# Patient Record
Sex: Female | Born: 1970 | Race: White | Hispanic: No | Marital: Single | Smoking: Never smoker
Health system: Southern US, Community
[De-identification: ages and names within clinical notes are randomized; demographics above are authoritative.]

## PROBLEM LIST (undated history)

## (undated) DIAGNOSIS — K219 Gastro-esophageal reflux disease without esophagitis: Secondary | ICD-10-CM

## (undated) DIAGNOSIS — Z5189 Encounter for other specified aftercare: Secondary | ICD-10-CM

## (undated) DIAGNOSIS — E662 Morbid (severe) obesity with alveolar hypoventilation: Secondary | ICD-10-CM

## (undated) DIAGNOSIS — Z8614 Personal history of Methicillin resistant Staphylococcus aureus infection: Secondary | ICD-10-CM

## (undated) DIAGNOSIS — IMO0001 Reserved for inherently not codable concepts without codable children: Secondary | ICD-10-CM

## (undated) DIAGNOSIS — F329 Major depressive disorder, single episode, unspecified: Secondary | ICD-10-CM

## (undated) DIAGNOSIS — F32A Depression, unspecified: Secondary | ICD-10-CM

## (undated) DIAGNOSIS — M797 Fibromyalgia: Secondary | ICD-10-CM

## (undated) DIAGNOSIS — M21371 Foot drop, right foot: Secondary | ICD-10-CM

## (undated) DIAGNOSIS — J069 Acute upper respiratory infection, unspecified: Secondary | ICD-10-CM

## (undated) DIAGNOSIS — G4730 Sleep apnea, unspecified: Secondary | ICD-10-CM

## (undated) DIAGNOSIS — Z8669 Personal history of other diseases of the nervous system and sense organs: Secondary | ICD-10-CM

## (undated) DIAGNOSIS — G894 Chronic pain syndrome: Secondary | ICD-10-CM

## (undated) DIAGNOSIS — R0602 Shortness of breath: Secondary | ICD-10-CM

## (undated) DIAGNOSIS — R011 Cardiac murmur, unspecified: Secondary | ICD-10-CM

## (undated) DIAGNOSIS — M1990 Unspecified osteoarthritis, unspecified site: Secondary | ICD-10-CM

## (undated) DIAGNOSIS — E039 Hypothyroidism, unspecified: Secondary | ICD-10-CM

## (undated) DIAGNOSIS — M549 Dorsalgia, unspecified: Secondary | ICD-10-CM

## (undated) DIAGNOSIS — I499 Cardiac arrhythmia, unspecified: Secondary | ICD-10-CM

## (undated) DIAGNOSIS — Z87442 Personal history of urinary calculi: Secondary | ICD-10-CM

## (undated) DIAGNOSIS — D649 Anemia, unspecified: Secondary | ICD-10-CM

## (undated) DIAGNOSIS — I10 Essential (primary) hypertension: Secondary | ICD-10-CM

## (undated) DIAGNOSIS — K429 Umbilical hernia without obstruction or gangrene: Secondary | ICD-10-CM

## (undated) DIAGNOSIS — N179 Acute kidney failure, unspecified: Secondary | ICD-10-CM

## (undated) DIAGNOSIS — I739 Peripheral vascular disease, unspecified: Secondary | ICD-10-CM

## (undated) DIAGNOSIS — T148XXA Other injury of unspecified body region, initial encounter: Secondary | ICD-10-CM

## (undated) DIAGNOSIS — R112 Nausea with vomiting, unspecified: Secondary | ICD-10-CM

## (undated) DIAGNOSIS — Z9889 Other specified postprocedural states: Secondary | ICD-10-CM

## (undated) DIAGNOSIS — L97409 Non-pressure chronic ulcer of unspecified heel and midfoot with unspecified severity: Secondary | ICD-10-CM

## (undated) DIAGNOSIS — F419 Anxiety disorder, unspecified: Secondary | ICD-10-CM

## (undated) DIAGNOSIS — N189 Chronic kidney disease, unspecified: Secondary | ICD-10-CM

## (undated) HISTORY — PX: BRAIN SURGERY: SHX531

## (undated) HISTORY — PX: EYE SURGERY: SHX253

## (undated) HISTORY — PX: BACK SURGERY: SHX140

## (undated) HISTORY — PX: LITHOTRIPSY: SUR834

## (undated) HISTORY — PX: OTHER SURGICAL HISTORY: SHX169

---
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
## 2001-08-01 ENCOUNTER — Ambulatory Visit (HOSPITAL_COMMUNITY): Admission: RE | Admit: 2001-08-01 | Discharge: 2001-08-01 | Payer: Self-pay | Admitting: Unknown Physician Specialty

## 2001-08-01 ENCOUNTER — Emergency Department (HOSPITAL_COMMUNITY): Admission: EM | Admit: 2001-08-01 | Discharge: 2001-08-01 | Payer: Self-pay

## 2003-10-17 ENCOUNTER — Inpatient Hospital Stay (HOSPITAL_COMMUNITY): Admission: AD | Admit: 2003-10-17 | Discharge: 2003-10-25 | Payer: Self-pay | Admitting: Internal Medicine

## 2003-10-18 IMAGING — CR DG RIBS W/ CHEST 3+V*R*
3 series · 3 of 3 positions shown · non-contrast
Comparison: none

CLINICAL DATA: Fever / hemoptysis / right upper quadrant pain.  
 UNILATERAL RIGHT RIBS WITH CHEST
 PA chest and cone-down right rib views were obtained.  There are no rib fractures evident.  No pneumothorax.  Heart enlarged.  Lungs clear.  No heart failure.  
 Cholecystectomy clips incidentally noted.  
 IMPRESSION
 Cardiomegaly ? no active disease.  
 No rib fractures.

[view not recorded (1 of 3)]
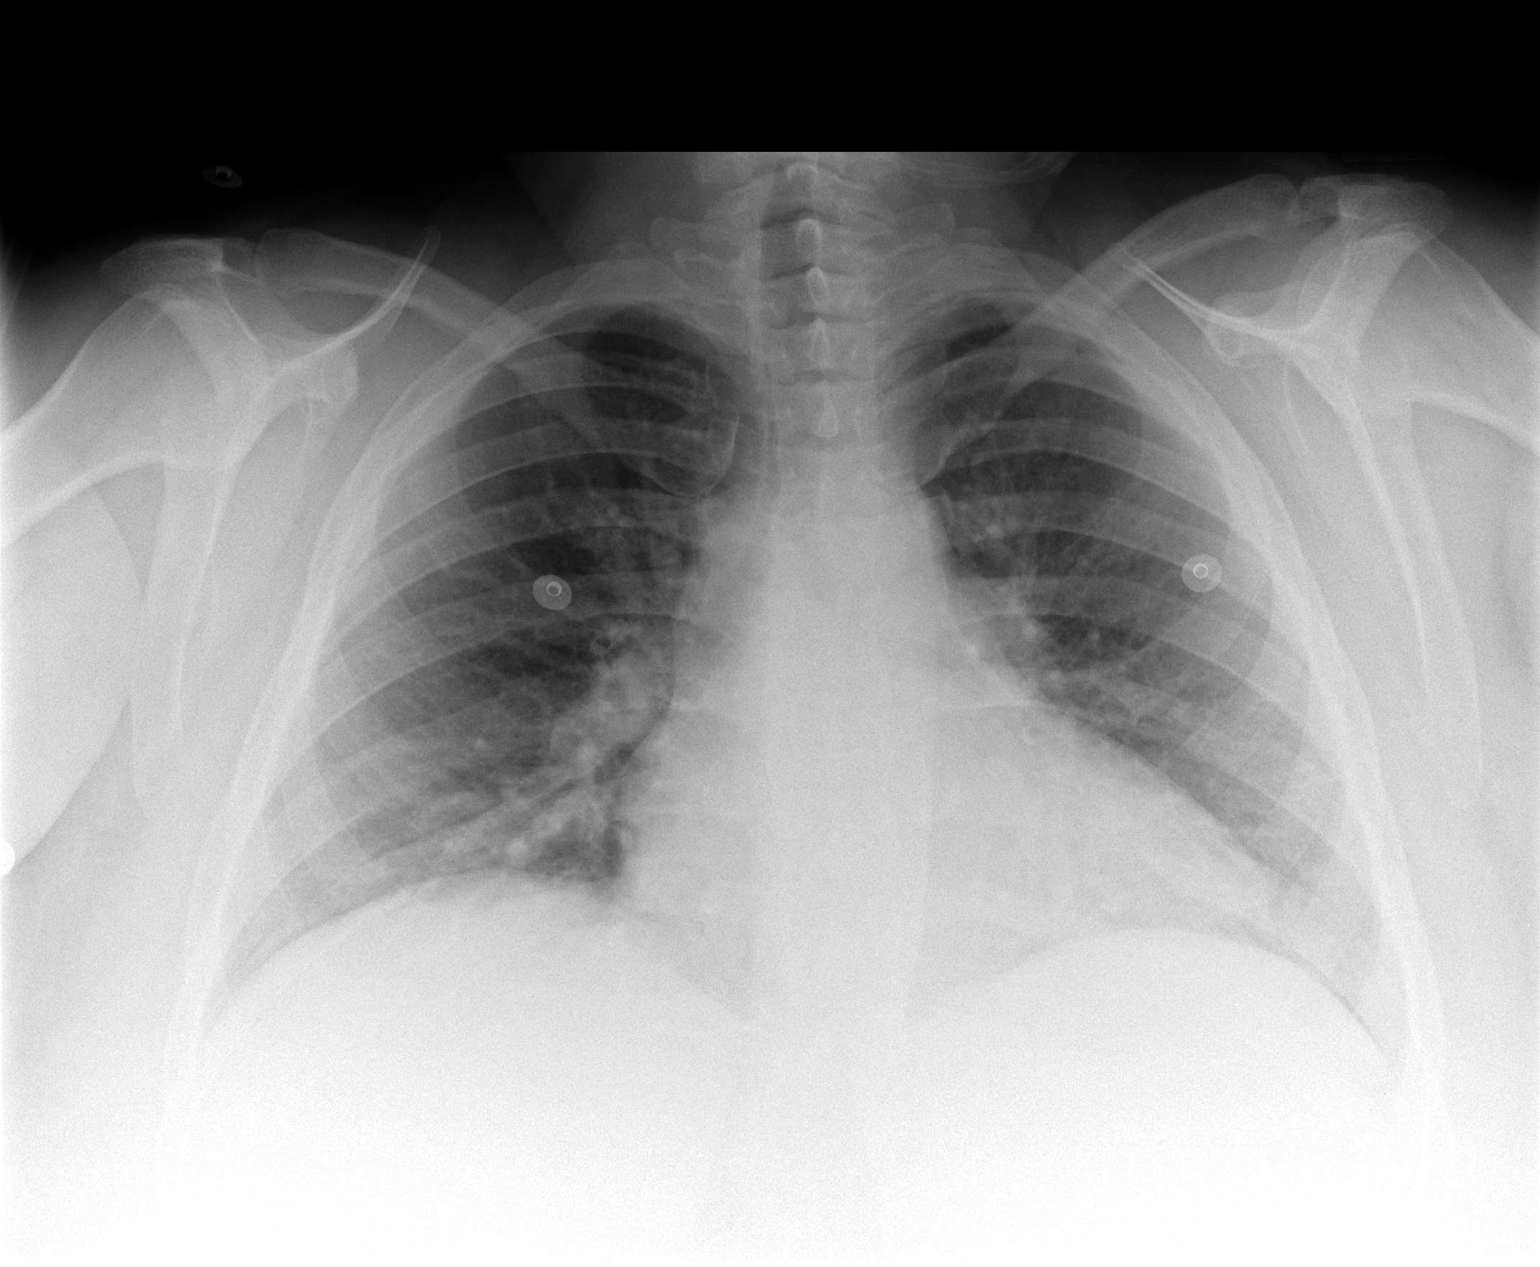

[view not recorded (2 of 3)]
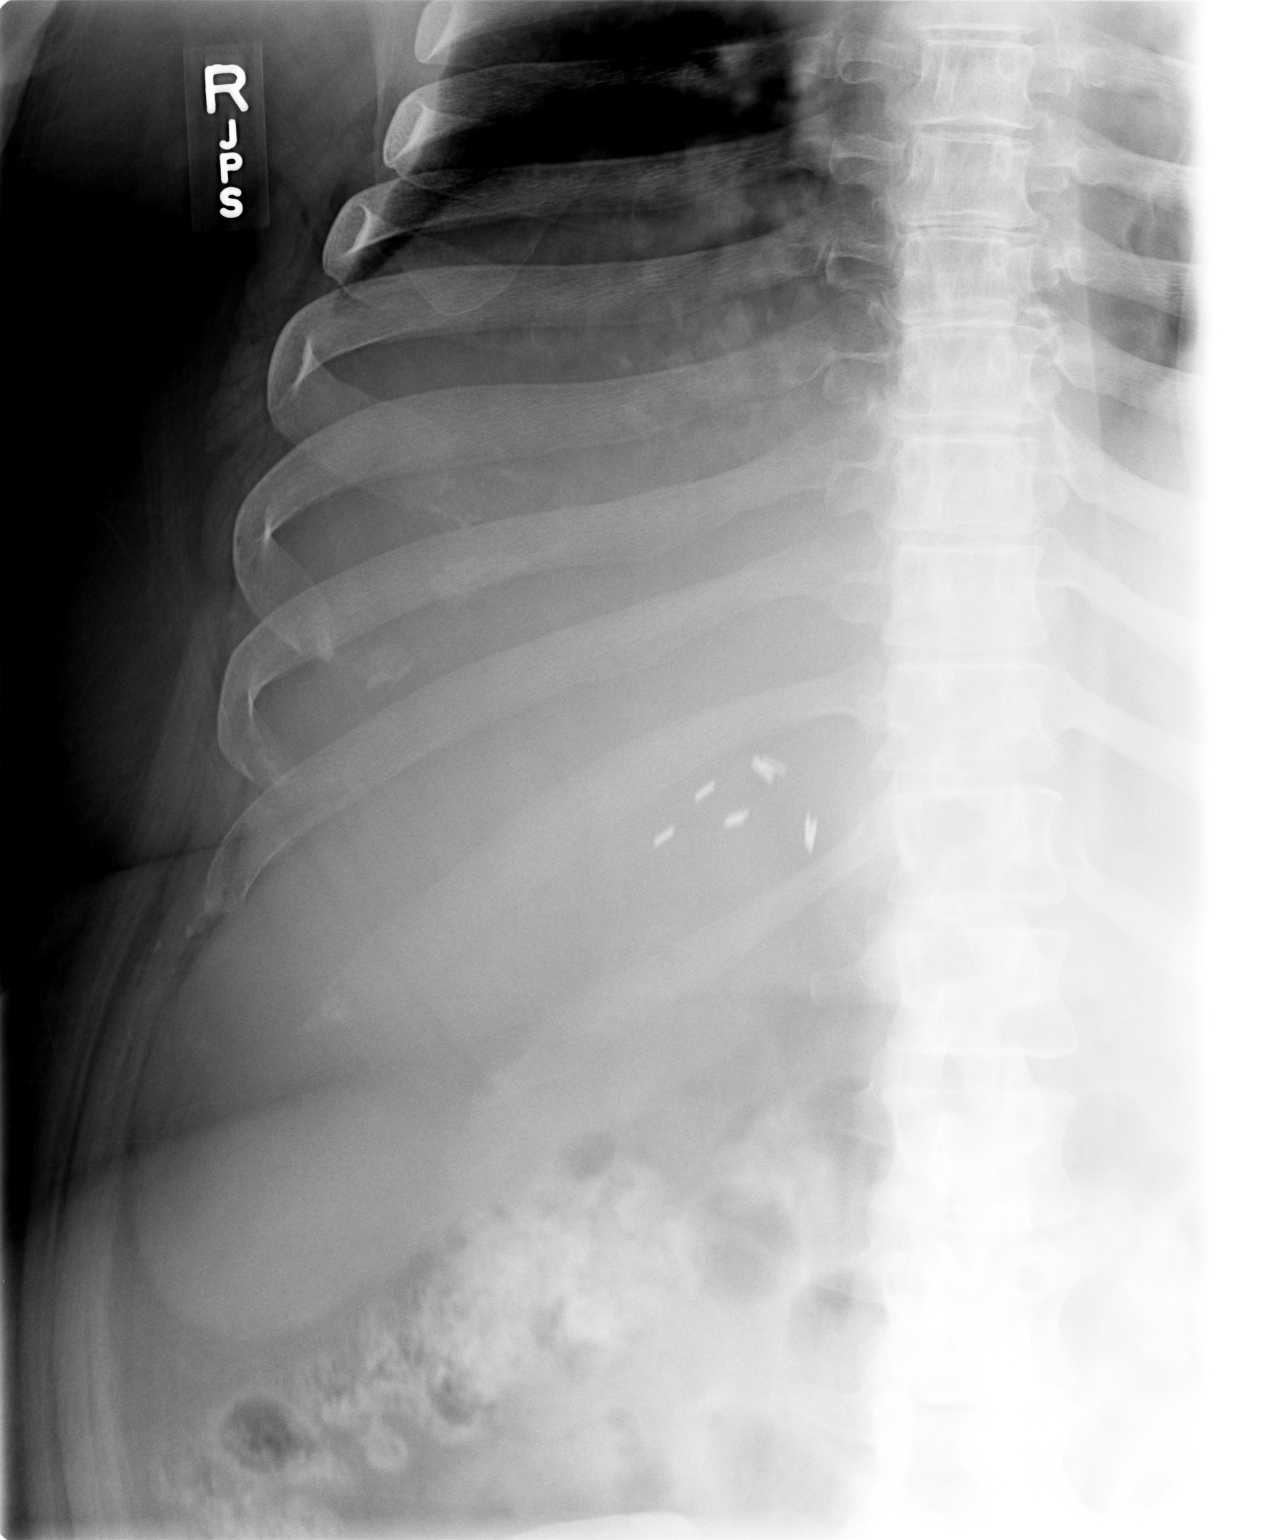

[view not recorded (3 of 3)]
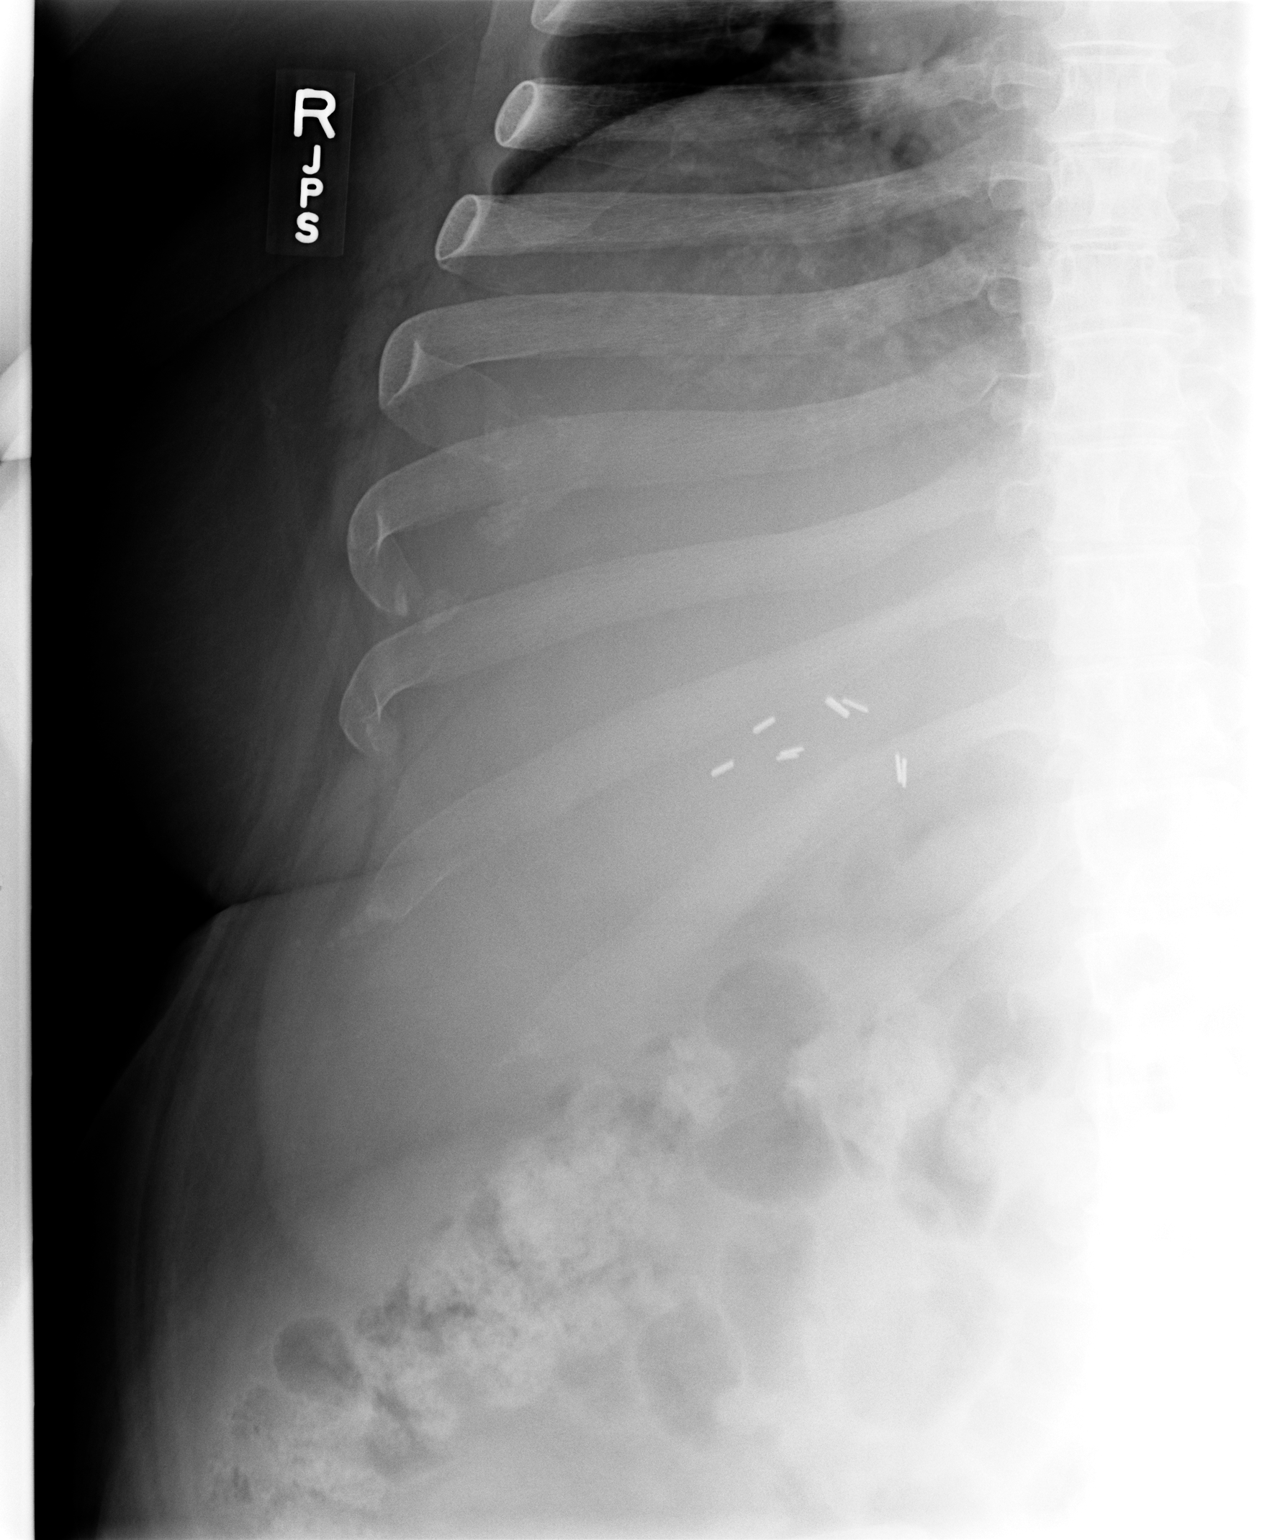

[3 of 3 positions shown; findings below may reference images not displayed]

## 2003-10-18 IMAGING — CT CT PELVIS W/ CM
2 of 7 series · 12 of 32 positions shown, 18 images · IV contrast (180 ML OMNI 300)
Comparison: none

CLINICAL DATA: Fever.  Hemoptysis.  Diabetes.  Elevated D-dimer.  Assess for pulmonary emboli. 
 CT SCAN OF THE CHEST WITH CONTRAST 
 Spiral scanning is performed during the intravenous administration of 180 cc of Omnipaque 300.  Contrast opacity is moderate at best and exclusion of pulmonary emboli is not done with the same confidence as would be the case with optimal contrast opacification.  
 I do not see any evidence of embolic disease to the pulmonary arterial tree.  The patient has linear atelectasis or scar in the left lower lobe.  Otherwise, the lungs are clear except for a rounded 9 mm density in the left upper lobe, likely to represent granuloma.  No hilar or mediastinal adenopathy.  No pleural fluid.  There is a tiny amount of pericardial fluid. 
 IMPRESSION
 1.  No sign of pulmonary emboli.  Contrast opacity is not optimal however.  See above discussion. 
 2.  Linear atelectasis or scar in the left lower lobe. 
 3.  Rounded density in the left upper lobe, likely to represent a benign granuloma. 
 CT SCAN OF THE ABDOMEN WITH CONTRAST 
 Spiral scanning was performed after oral and intravenous contrast administration. 
 The liver and spleen are unremarkable.  The patient has had cholecystectomy.  The pancreas appears normal.  The adrenal glands are normal.  No abnormality of the kidneys is seen.  The aorta and IVC are normal.  No free fluid or air. 
 Negative CT scan of the abdomen. 
 CT SCAN OF THE PELVIS 
 Spiral scanning is performed after oral and intravenous contrast administration. 
 There is no free fluid.  The bladder, uterus, and adnexal regions appear unremarkable.  No bowel pathology is seen. 
 1.  Negative CT scan of the pelvis.

[Series 3: pe w/ lower ext · axial · 0.98mm/px · z∈[-632,-153]mm · 10 of 204 slices shown, 16 images]
[im 19/204  soft-tissue]
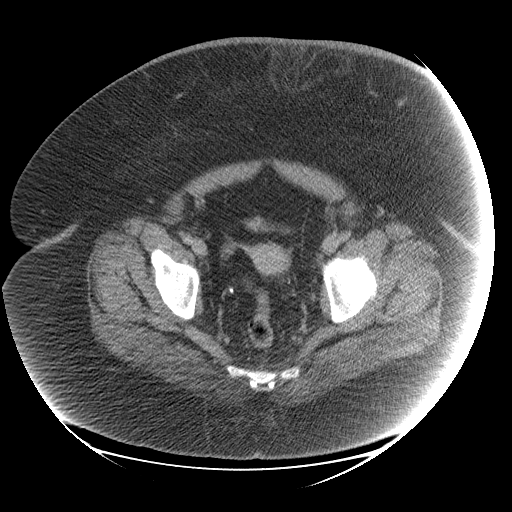
[im 19/204  bone]
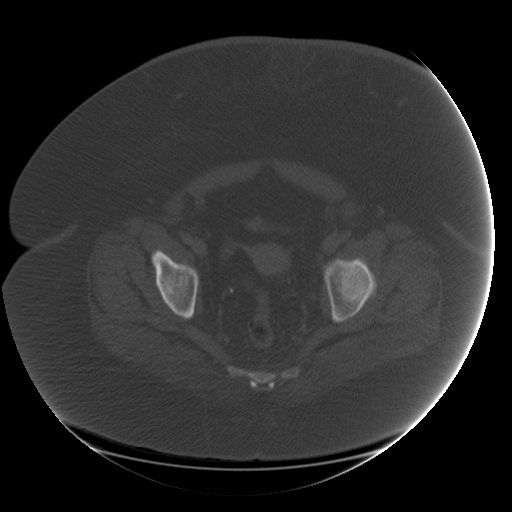
[im 37/204  soft-tissue]
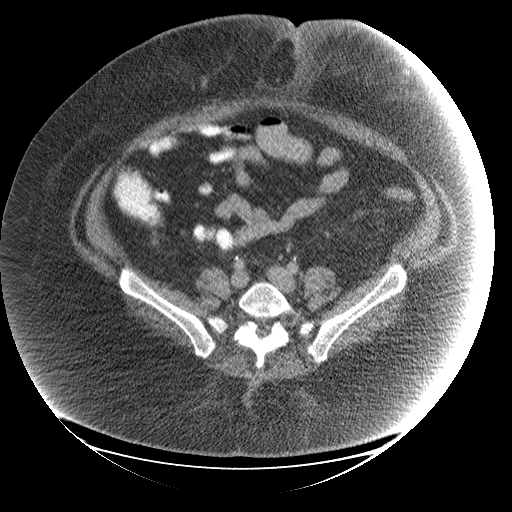
[im 56/204  soft-tissue]
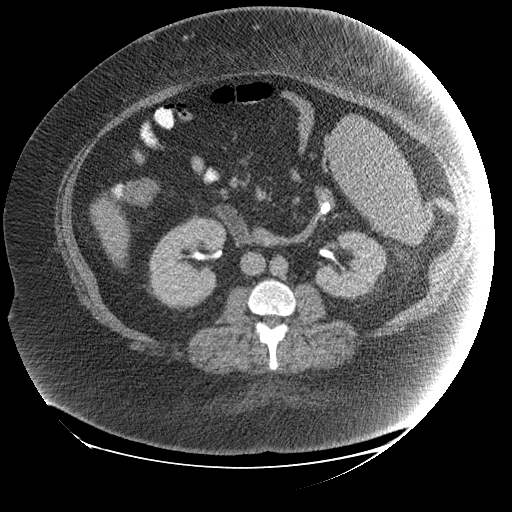
[im 74/204  soft-tissue]
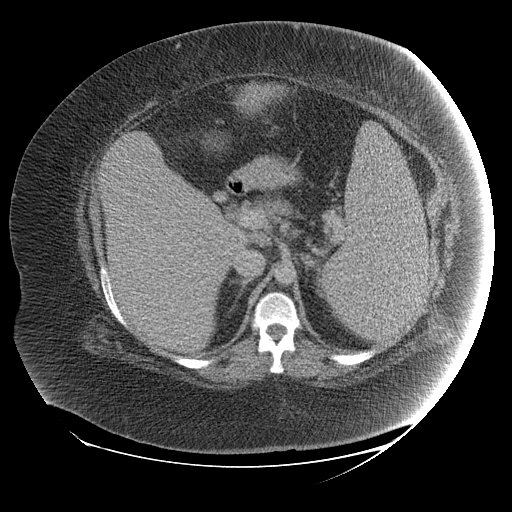
[im 93/204  soft-tissue]
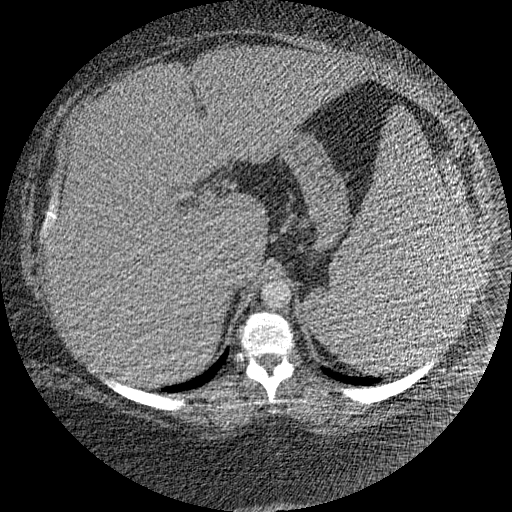
[im 111/204  soft-tissue]
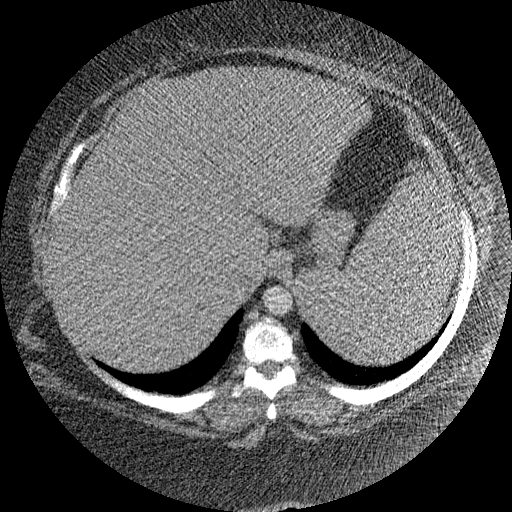
[im 130/204  soft-tissue]
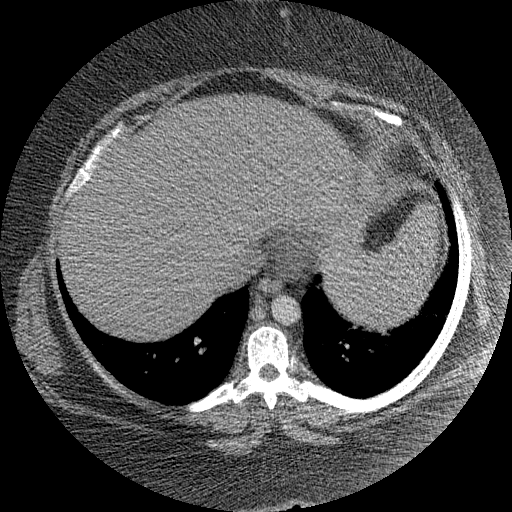
[im 130/204  lung]
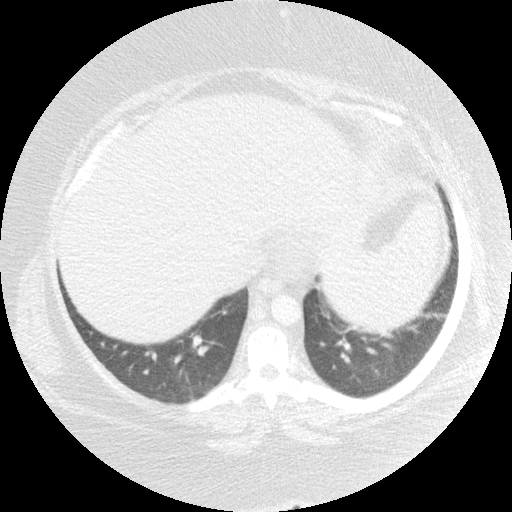
[im 148/204  soft-tissue]
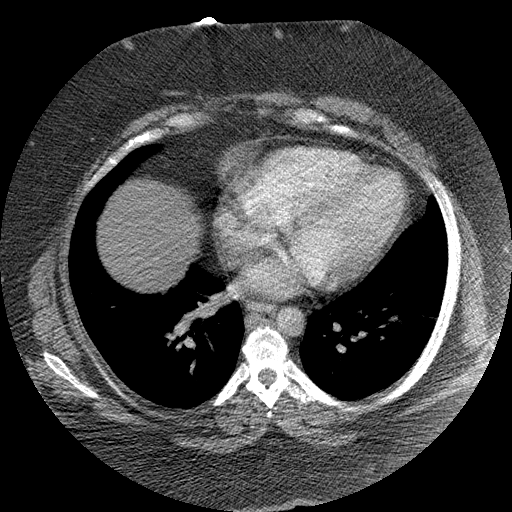
[im 148/204  lung]
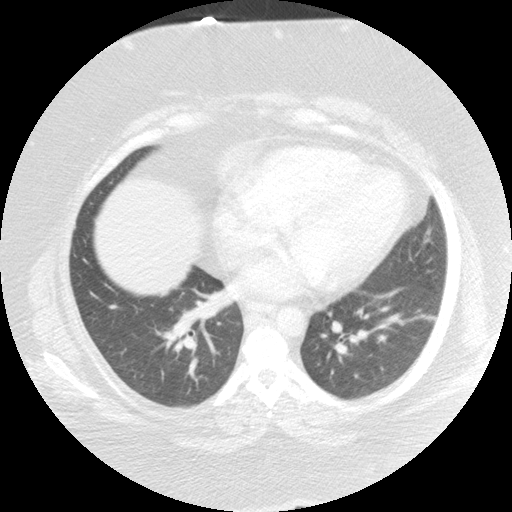
[im 167/204  soft-tissue]
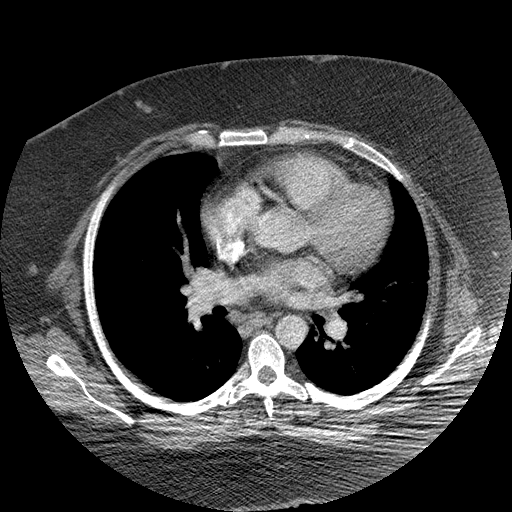
[im 167/204  lung]
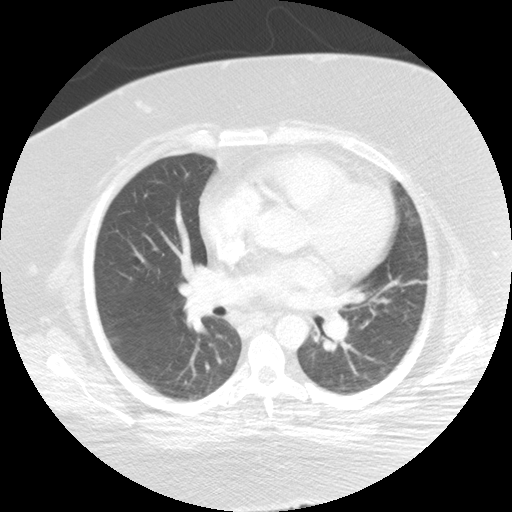
[im 167/204  bone]
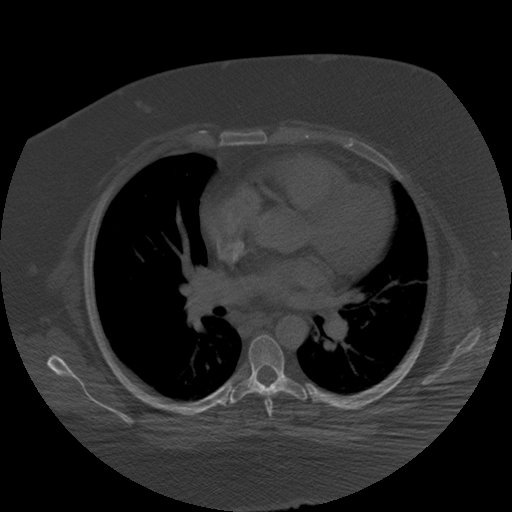
[im 185/204  soft-tissue]
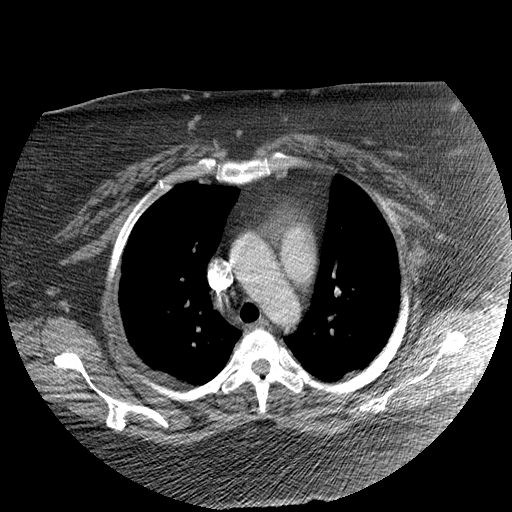
[im 185/204  lung]
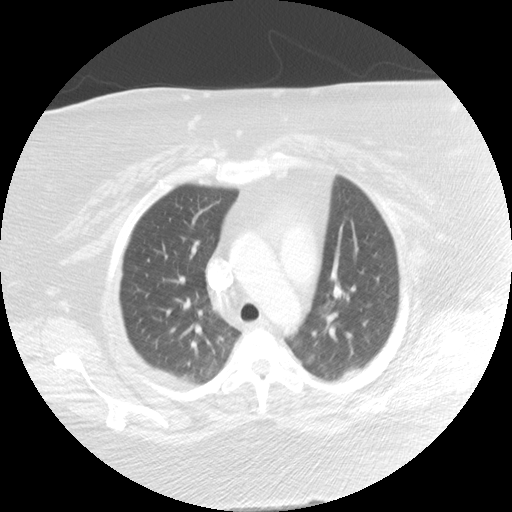

[Series 104: reformatted · sagittal · 0.47mm/px · 2 of 76 slices shown]
[im 26/76  soft-tissue]
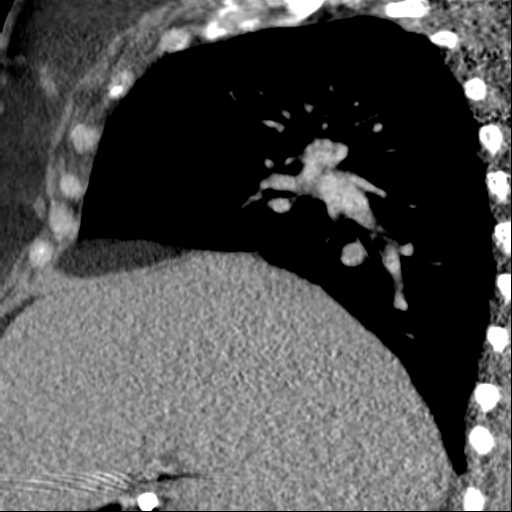
[im 51/76  soft-tissue]
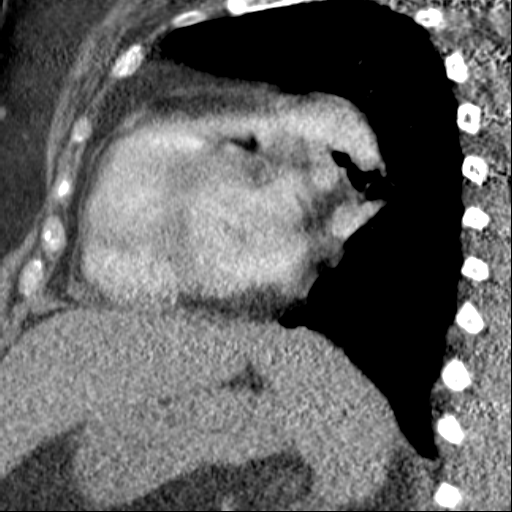

[12 of 32 positions shown; findings below may reference images not displayed]

## 2003-10-18 IMAGING — CT CT PARANASAL SINUSES LIMITED
1 series · 16 of 20 positions shown, 20 images · non-contrast
Comparison: none

CLINICAL DATA: Fever / hemoptysis.  
LIMITED MAXILLOFACIAL CT
This limited study of the sinuses was done with the patient supine.  She was not able to lie in the prone position for routine imaging of the sinuses.    
The right frontal sinus is hypoplastic and essentially non-aerated.  This is a variant of normal.  Other paranasal sinuses are well aerated with no acute or chronic changes of sinusitis.  No bony destructive lesions.  No air in the orbits.  
IMPRESSION
No acute or significant findings ? the right frontal sinus is hypoplastic.

[Series 2: — · axial · 0.35mm/px · z∈[+97,+184]mm · 16 of 20 slices shown, 20 images]
[im 2/20  brain]
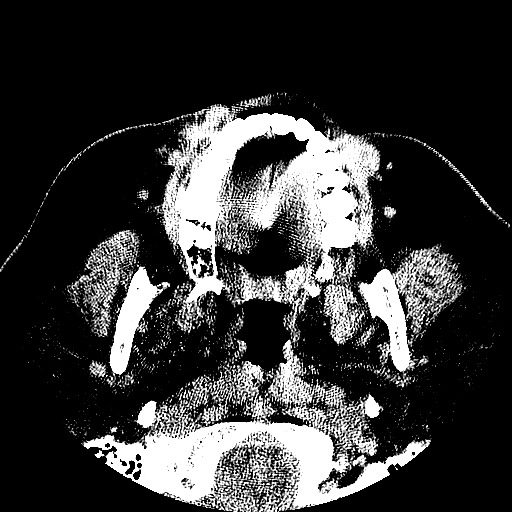
[im 2/20  bone]
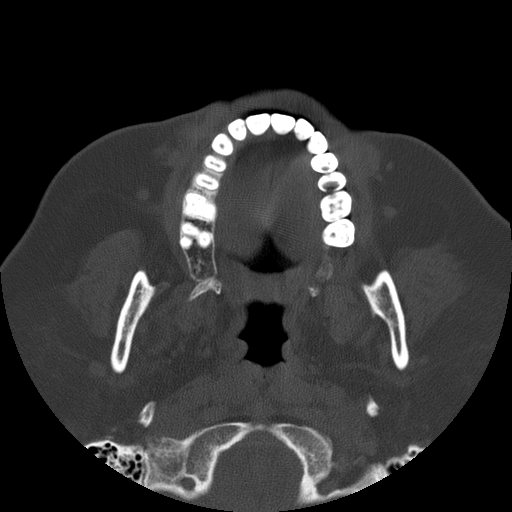
[im 3/20  bone]
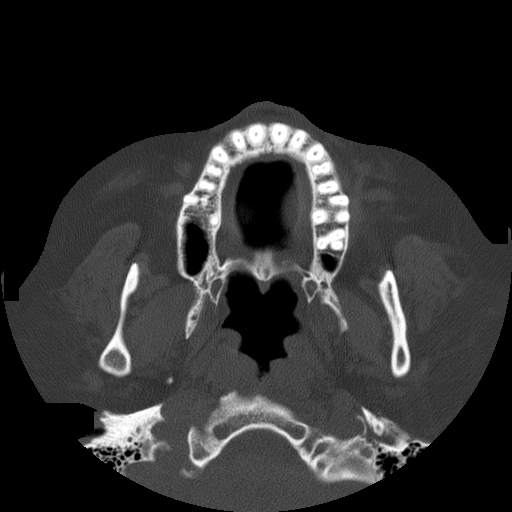
[im 4/20  bone]
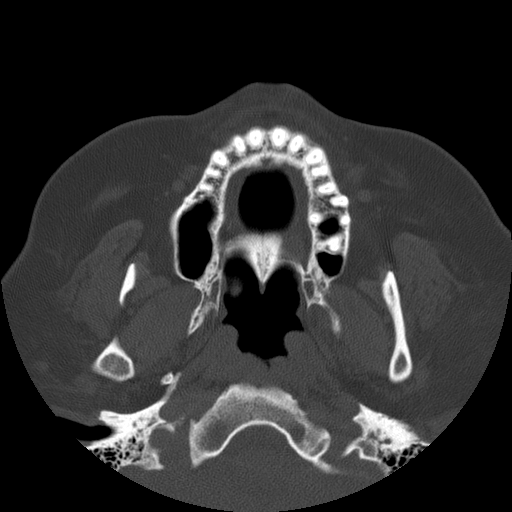
[im 5/20  bone]
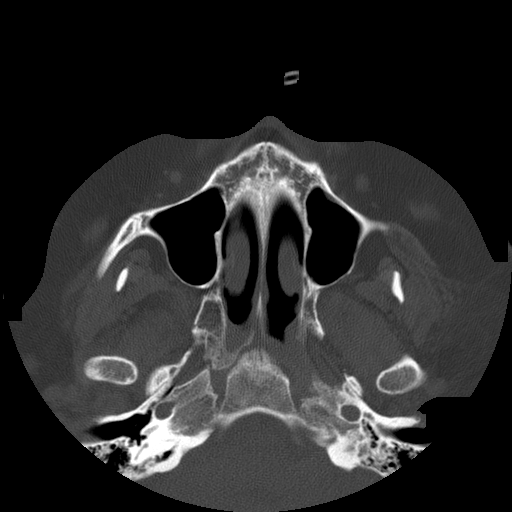
[im 7/20  brain]
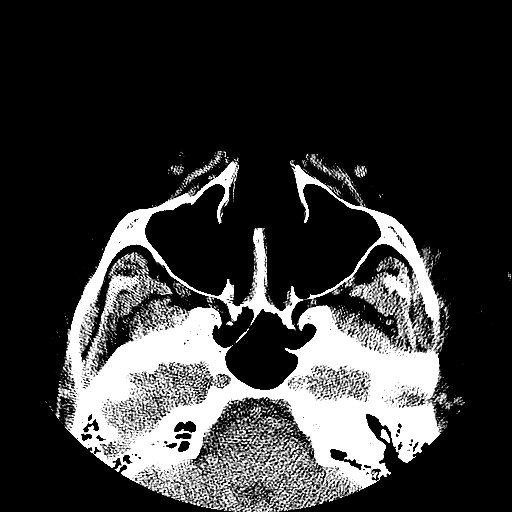
[im 7/20  bone]
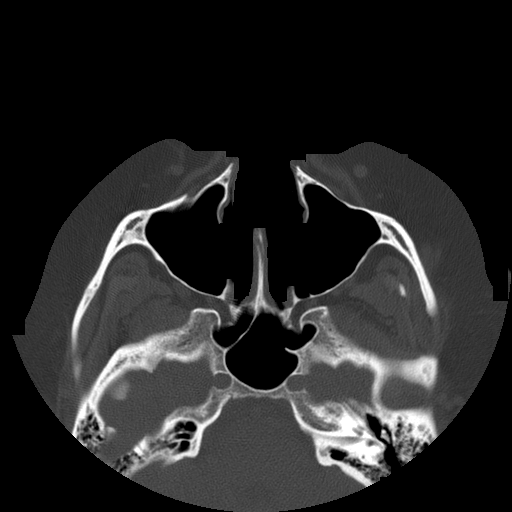
[im 8/20  bone]
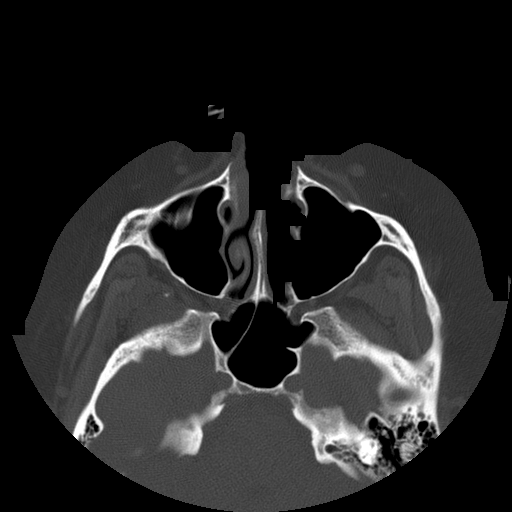
[im 9/20  bone]
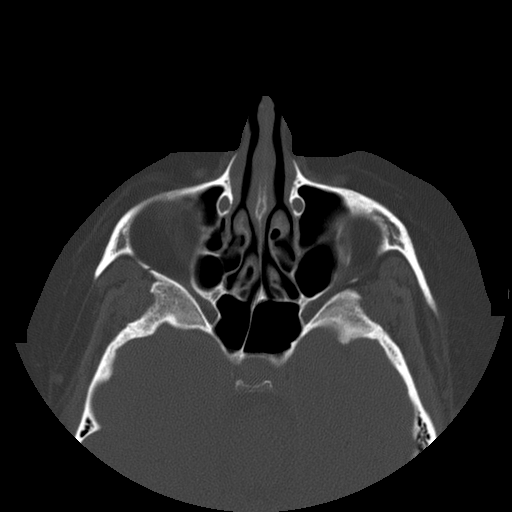
[im 10/20  bone]
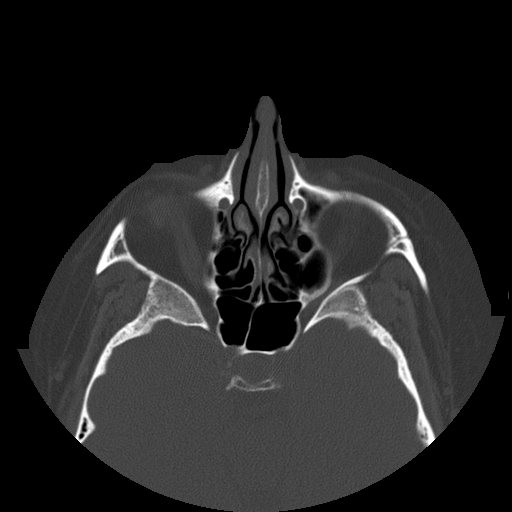
[im 11/20  brain]
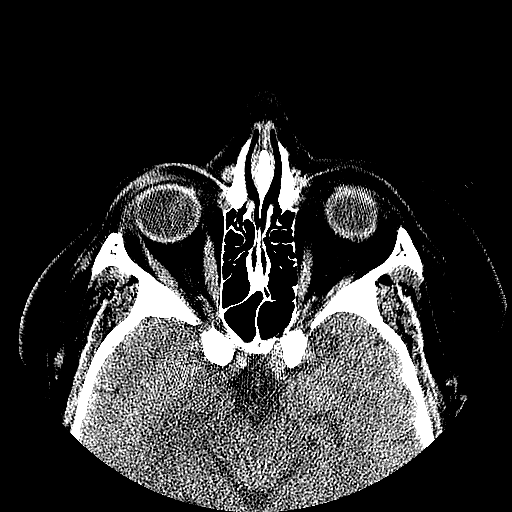
[im 11/20  bone]
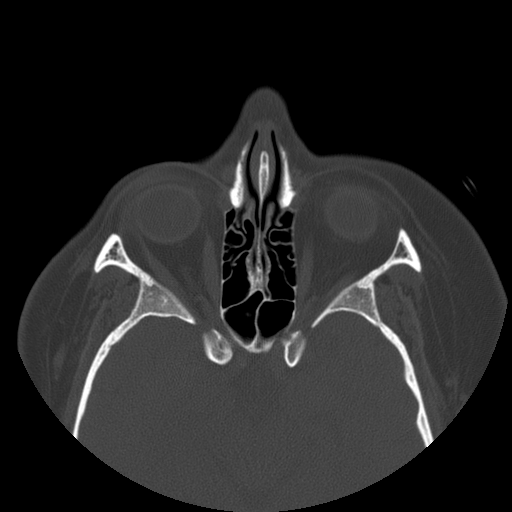
[im 12/20  bone]
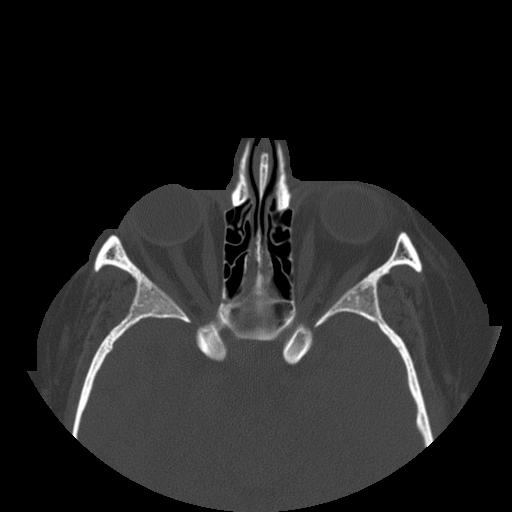
[im 13/20  bone]
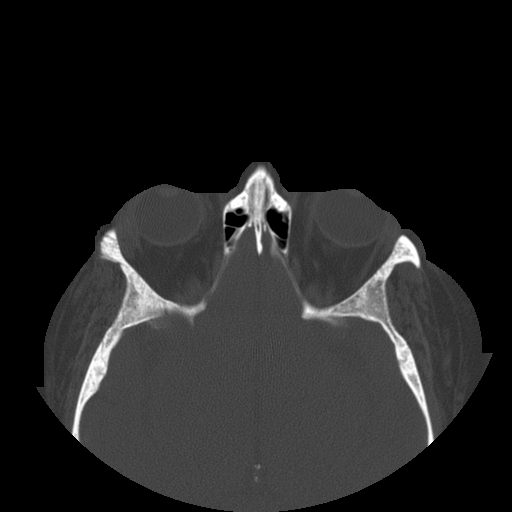
[im 14/20  bone]
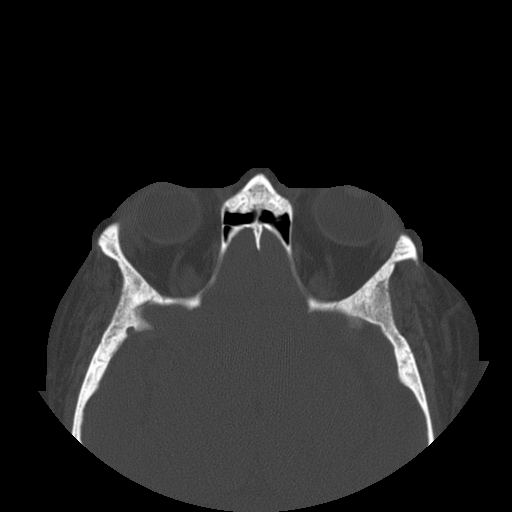
[im 16/20  brain]
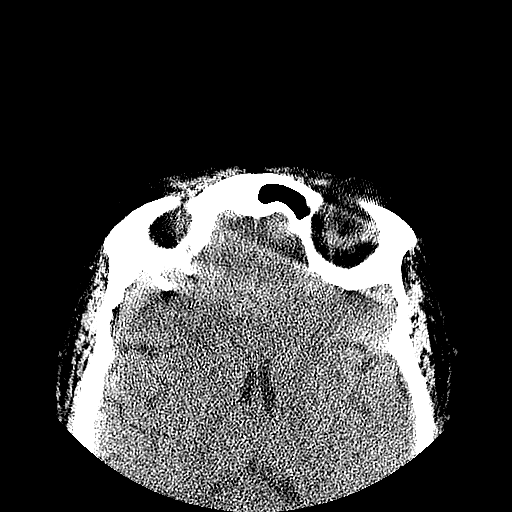
[im 16/20  bone]
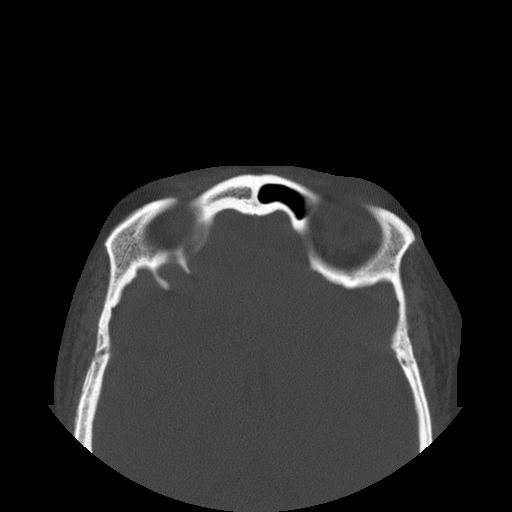
[im 17/20  bone]
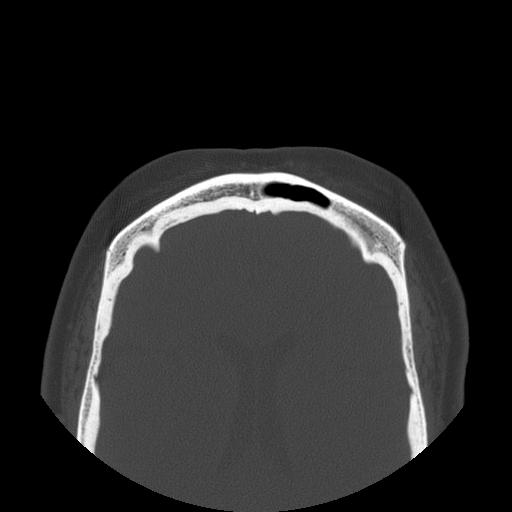
[im 18/20  bone]
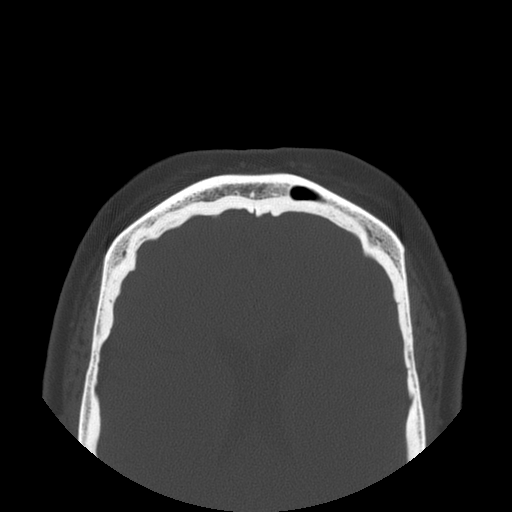
[im 19/20  bone]
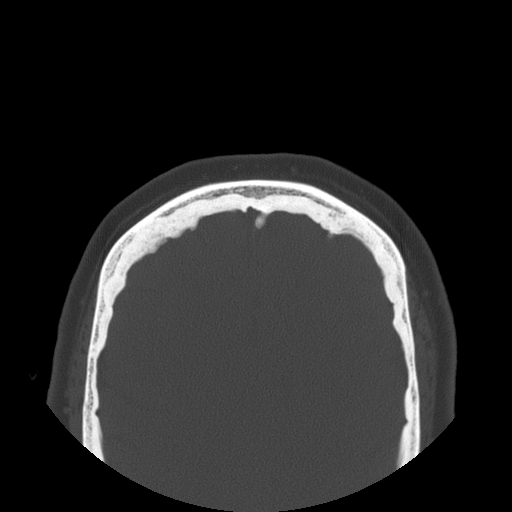

[16 of 20 positions shown; findings below may reference images not displayed]

## 2003-10-20 ENCOUNTER — Encounter (INDEPENDENT_AMBULATORY_CARE_PROVIDER_SITE_OTHER): Payer: Self-pay | Admitting: *Deleted

## 2003-10-24 ENCOUNTER — Encounter (INDEPENDENT_AMBULATORY_CARE_PROVIDER_SITE_OTHER): Payer: Self-pay | Admitting: *Deleted

## 2003-10-25 IMAGING — CR DG CHEST 1V
1 series · 1 of 1 positions shown · non-contrast
Comparison: [DATE].

CLINICAL DATA: Fever.  Hemoptysis.  Shortness of breath.  
 SINGLE-VIEW CHEST

[view not recorded]
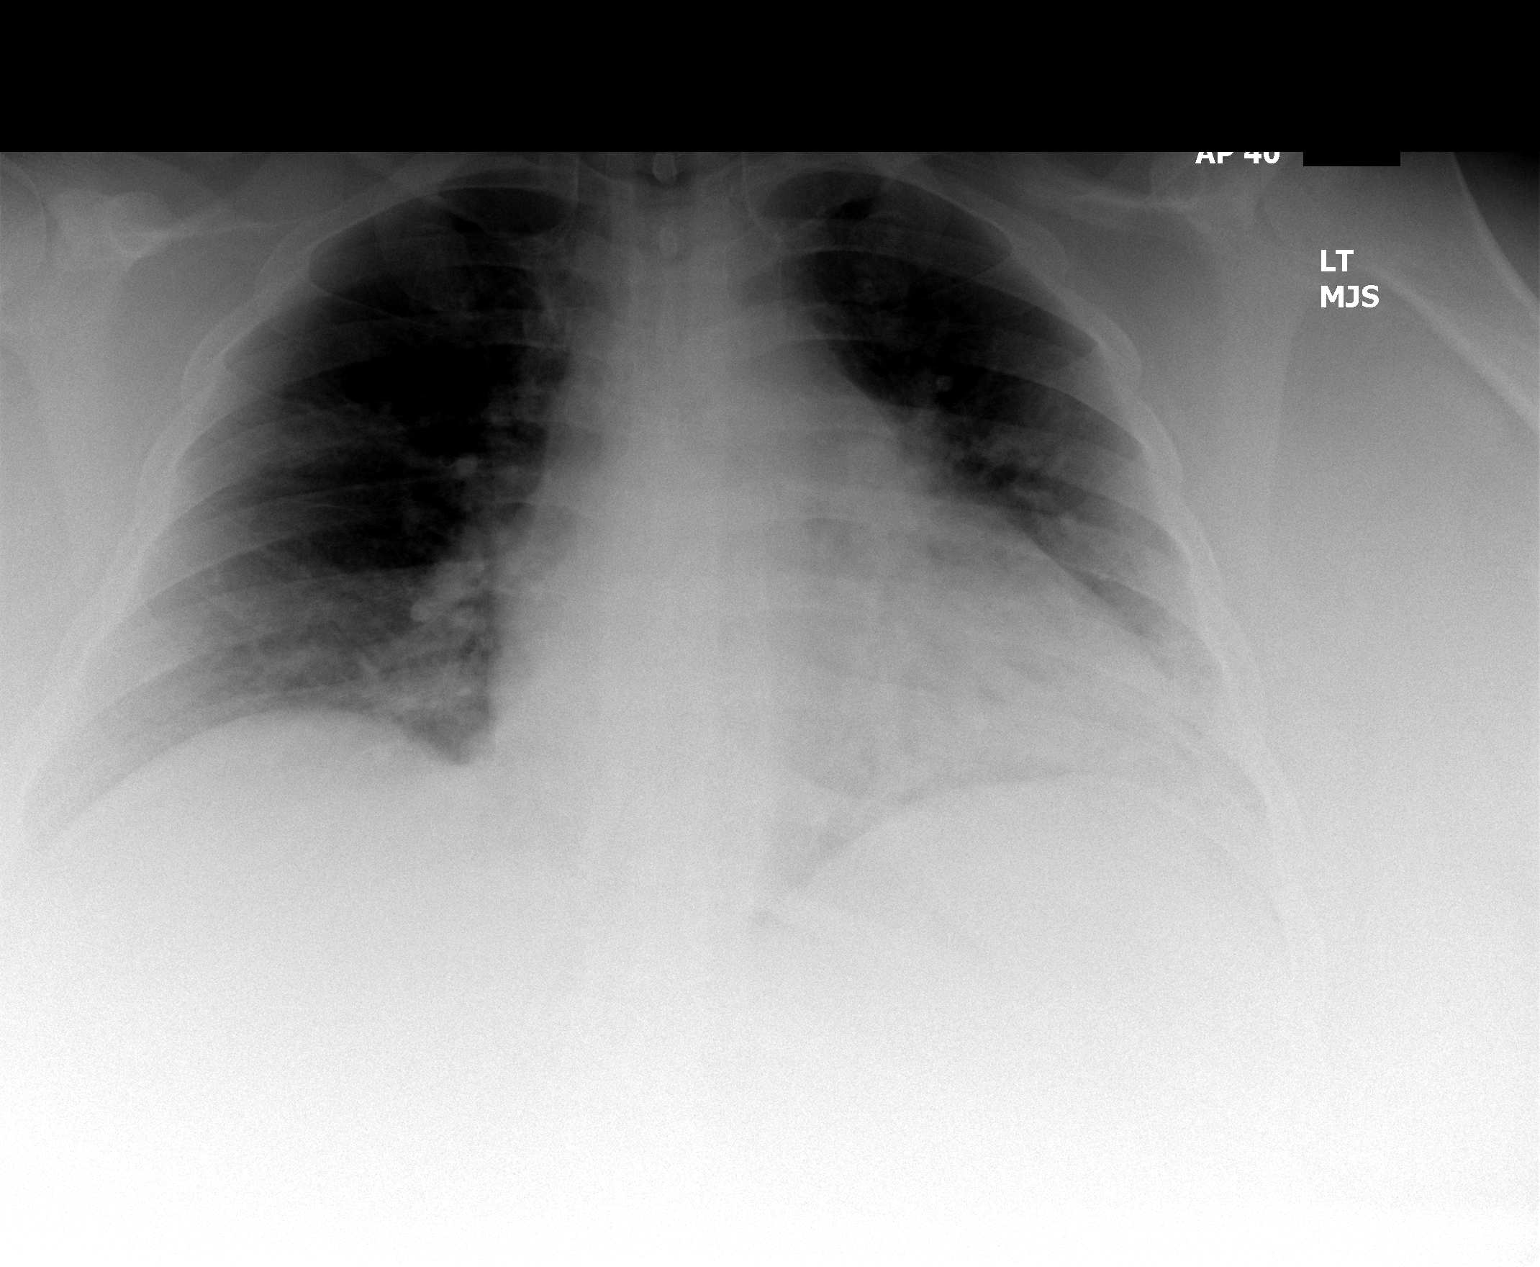

[1 of 1 positions shown; findings below may reference images not displayed]

Low lung volumes are again seen.  Heart size is prominent, although this may be due to low lung volumes.  Both lungs remain clear.  
 IMPRESSION
 Low inspiratory lung volumes.  No acute disease.

## 2003-12-01 ENCOUNTER — Inpatient Hospital Stay (HOSPITAL_COMMUNITY): Admission: EM | Admit: 2003-12-01 | Discharge: 2003-12-09 | Payer: Self-pay | Admitting: Oncology

## 2003-12-02 IMAGING — CR DG CHEST 2V
2 series · 2 of 2 positions shown · non-contrast
Comparison: [DATE].

CLINICAL DATA: Anemia and thrombocytopenia.  Low grade fever.  Hemoptysis.
 TWO VIEW CHEST   - [DATE]

[view not recorded (1 of 2)]
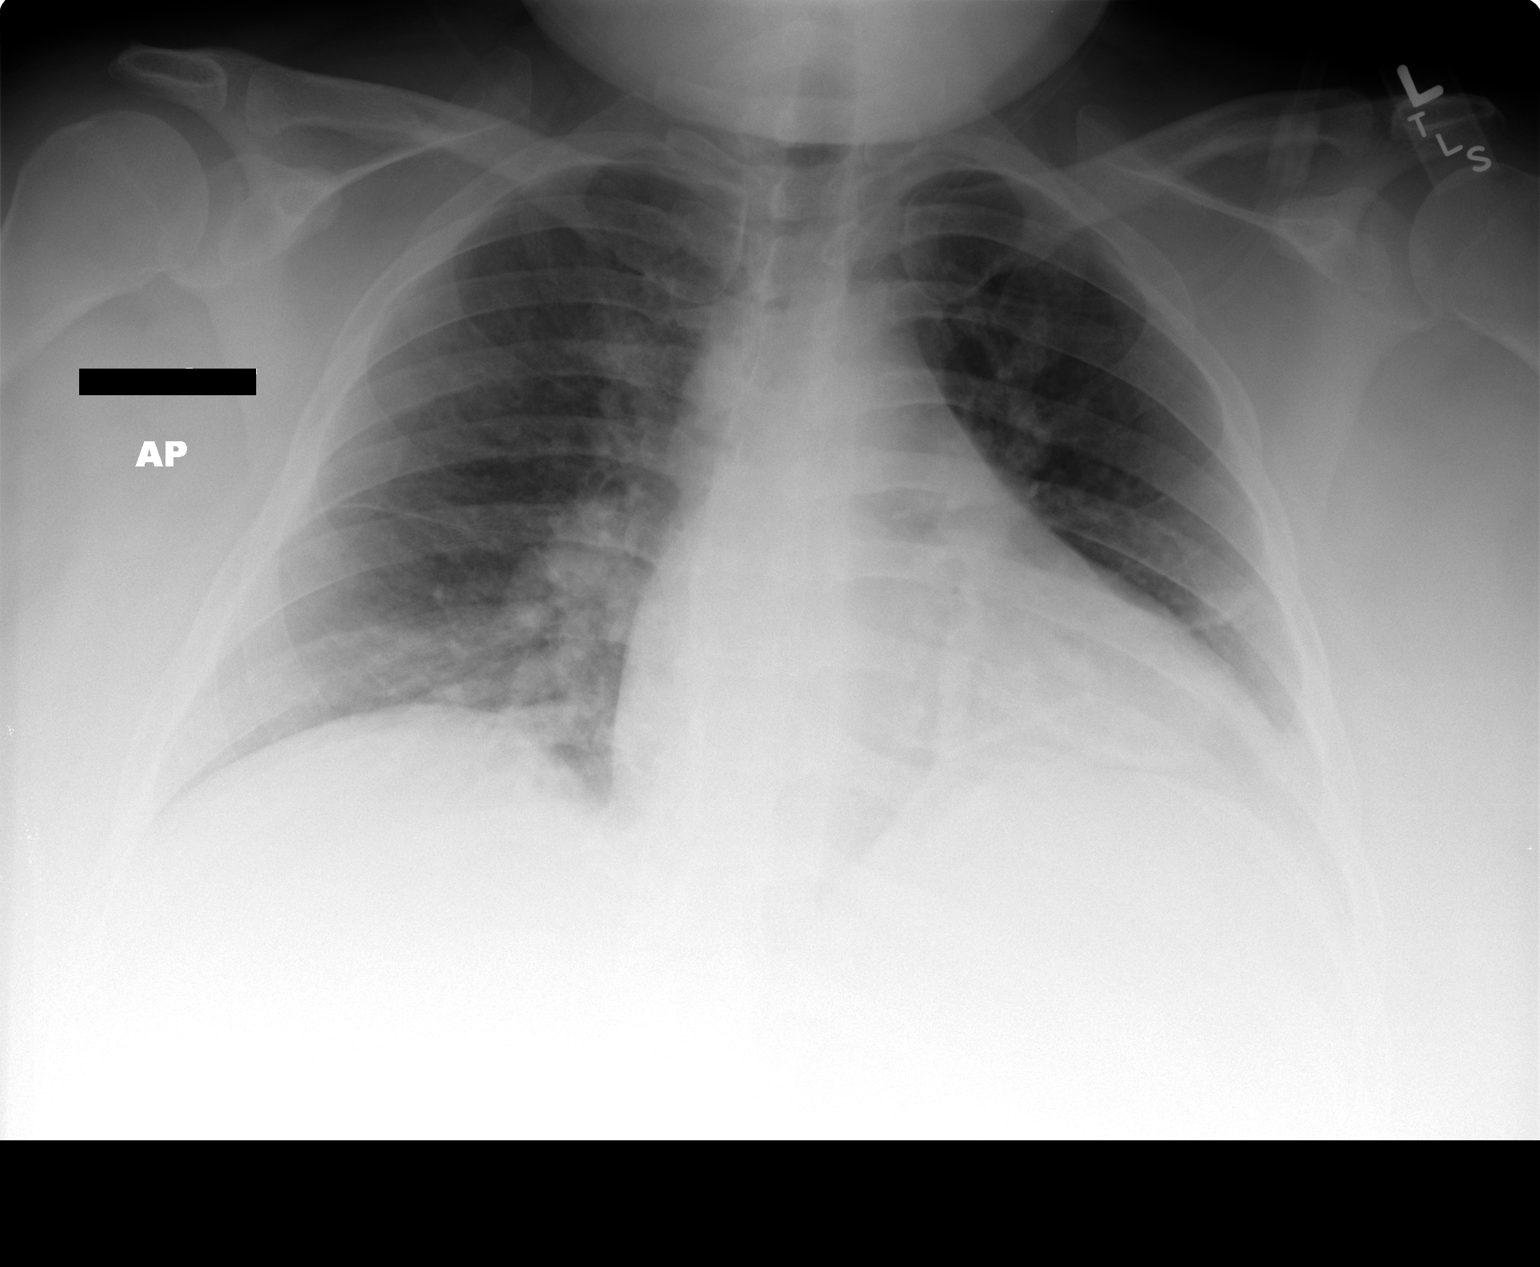

[view not recorded (2 of 2)]
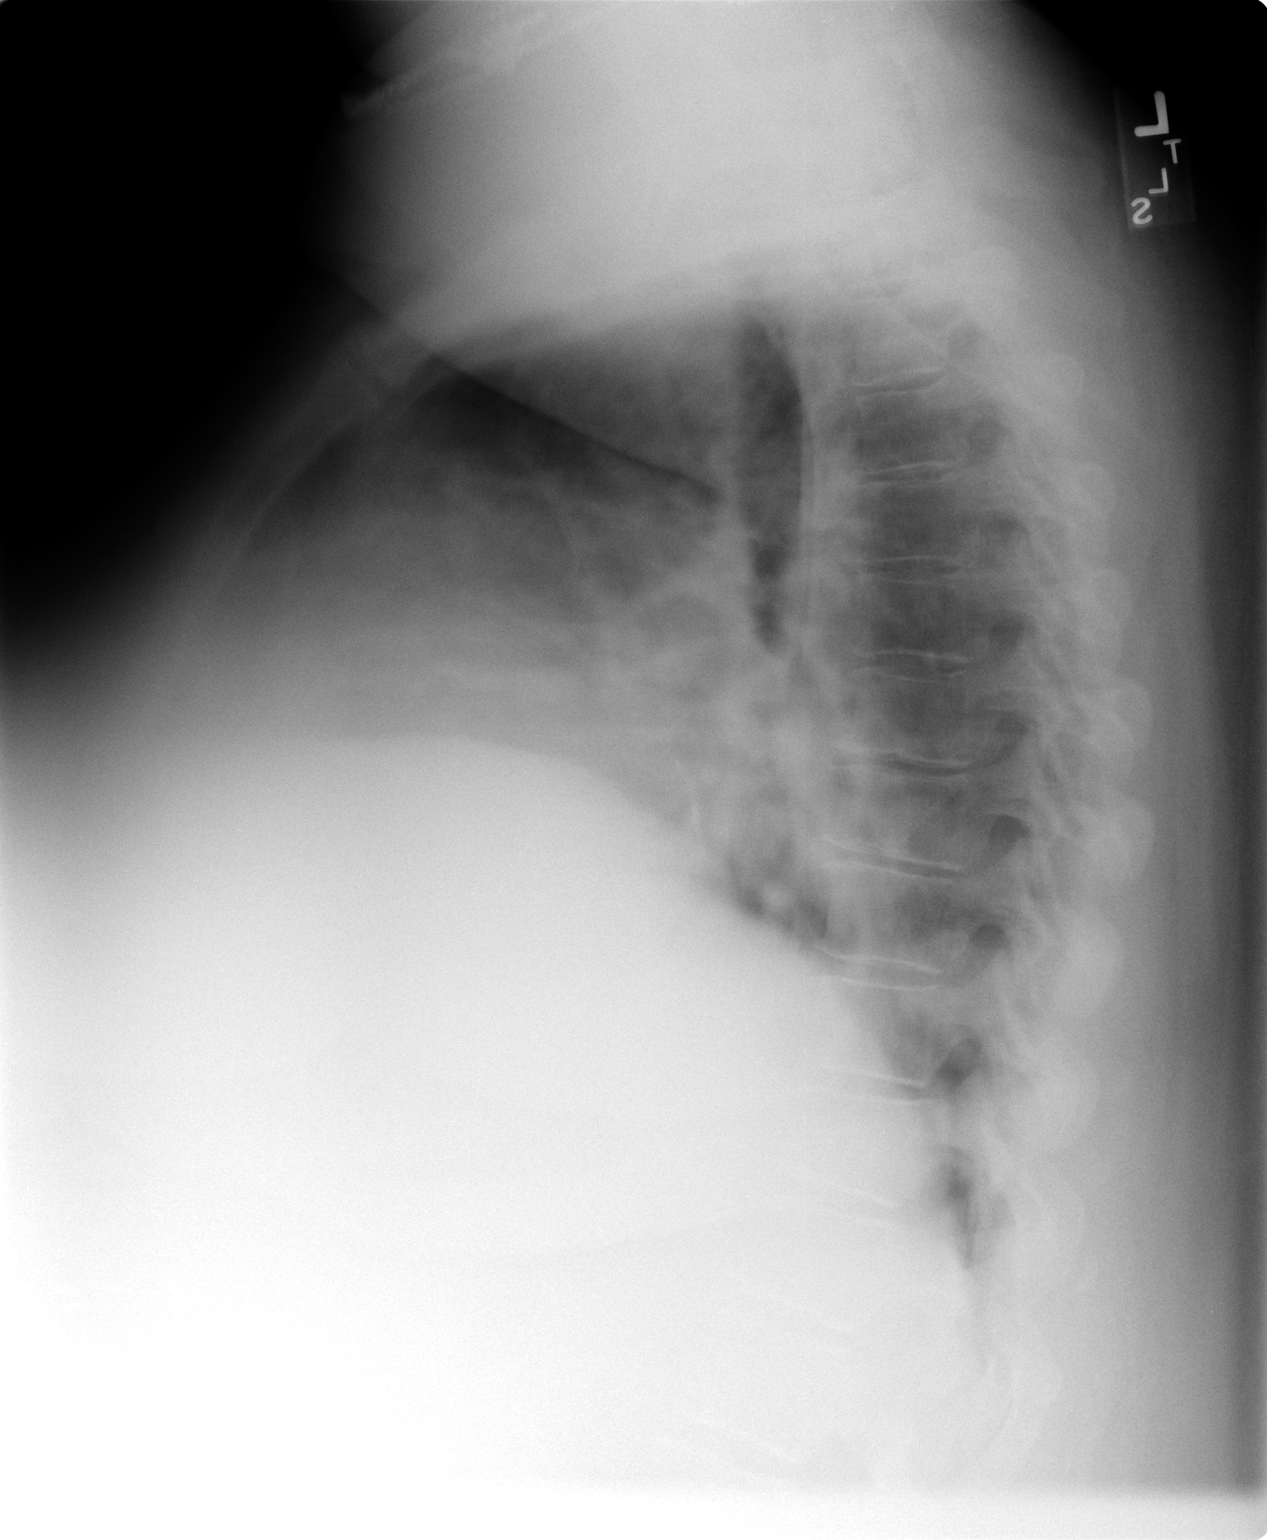

[2 of 2 positions shown; findings below may reference images not displayed]

FINDINGS: Two view exam of the chest shows very low lung volumes which crowd pulmonary vascularity and accentuate the cardiac silhouette.  Focal ill-defined density in the right suprahilar region may be superimposed shadows, but early infiltrate is difficult to exclude.  Features in the left base suggest atelectasis.  
 IMPRESSION
 1.  Low volume film with vascular crowding.  Probable atelectasis in the left base although early infiltrate is not completely excluded.
 2.  Superimposition of shadows versus a small focus of atelectasis or infiltrate in the right suprahilar region.

## 2003-12-05 ENCOUNTER — Encounter (INDEPENDENT_AMBULATORY_CARE_PROVIDER_SITE_OTHER): Payer: Self-pay | Admitting: Specialist

## 2003-12-05 IMAGING — CR DG ANKLE COMPLETE 3+V*R*
3 series · 3 of 3 positions shown · non-contrast
Comparison: none

CLINICAL DATA: Anemia.  Thrombocytopenia.  Ankle pain. 
RIGHT ANKLE 3 VIEWS
No comparison. 
The soft tissues surrounding the ankle are prominent, and there is osteopenia.  No acute fracture, dislocation, or bone destruction is seen.

[view not recorded (1 of 3)]
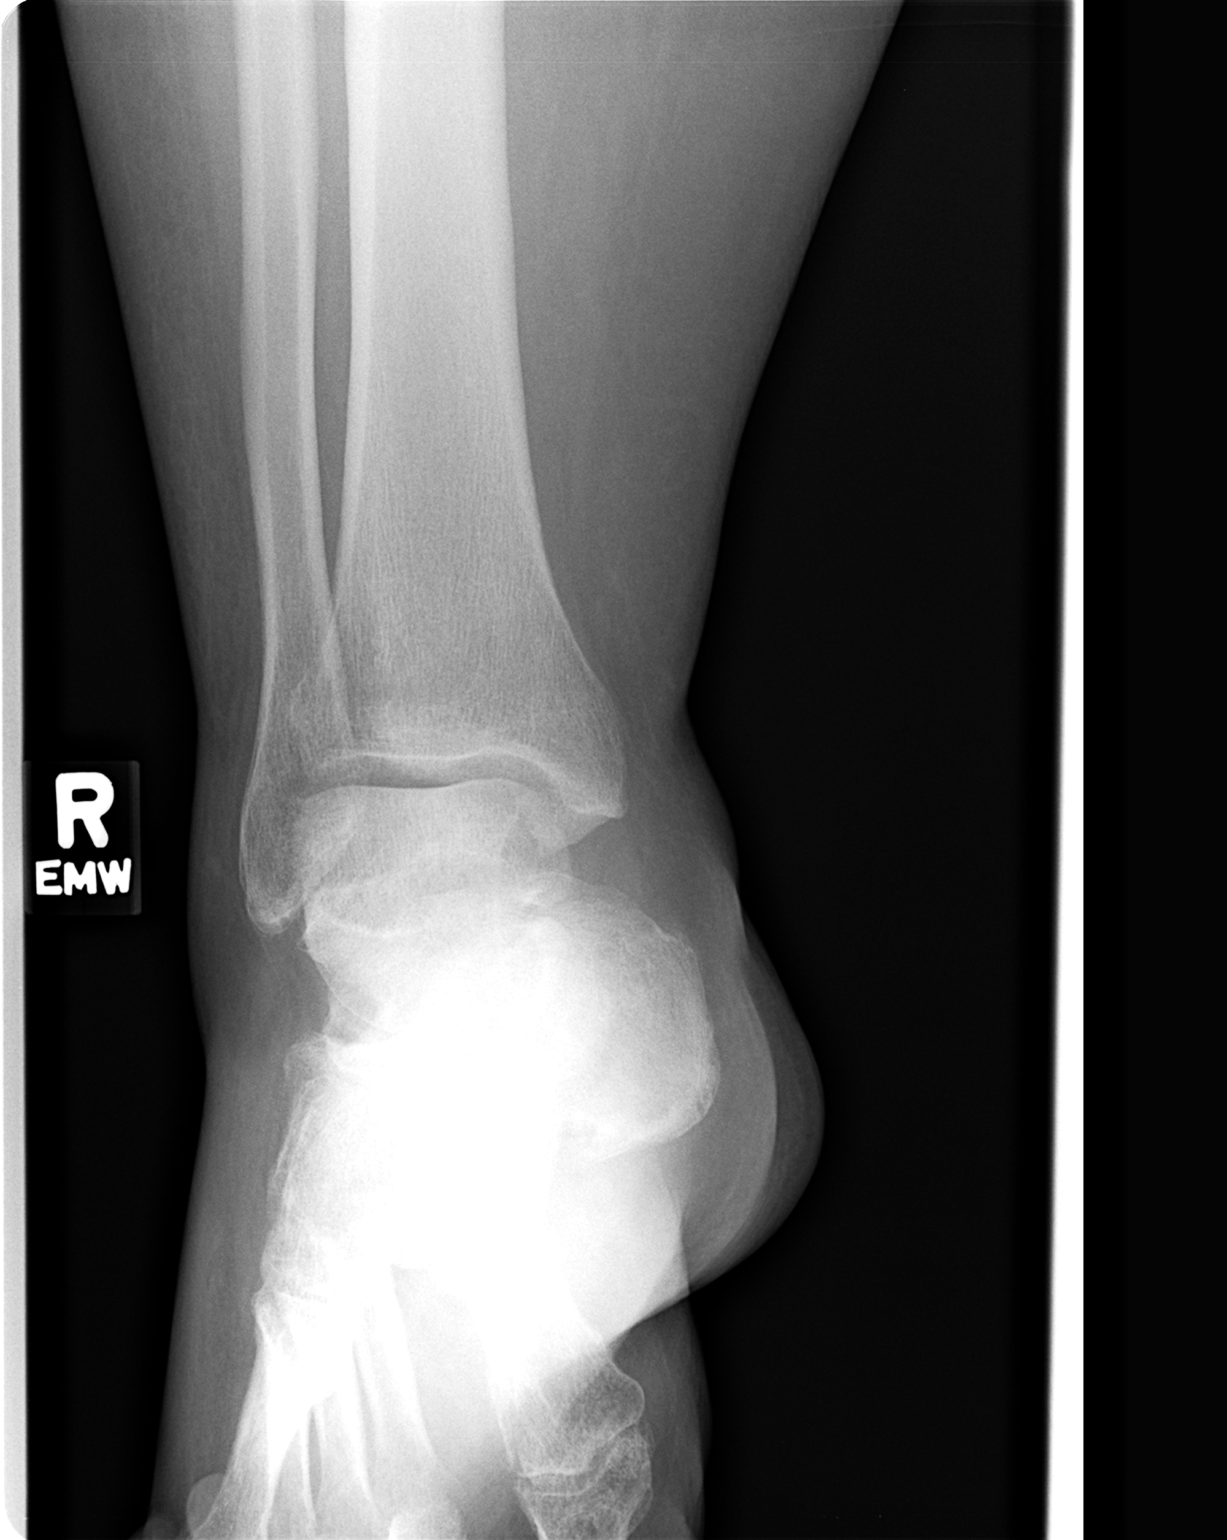

[view not recorded (2 of 3)]
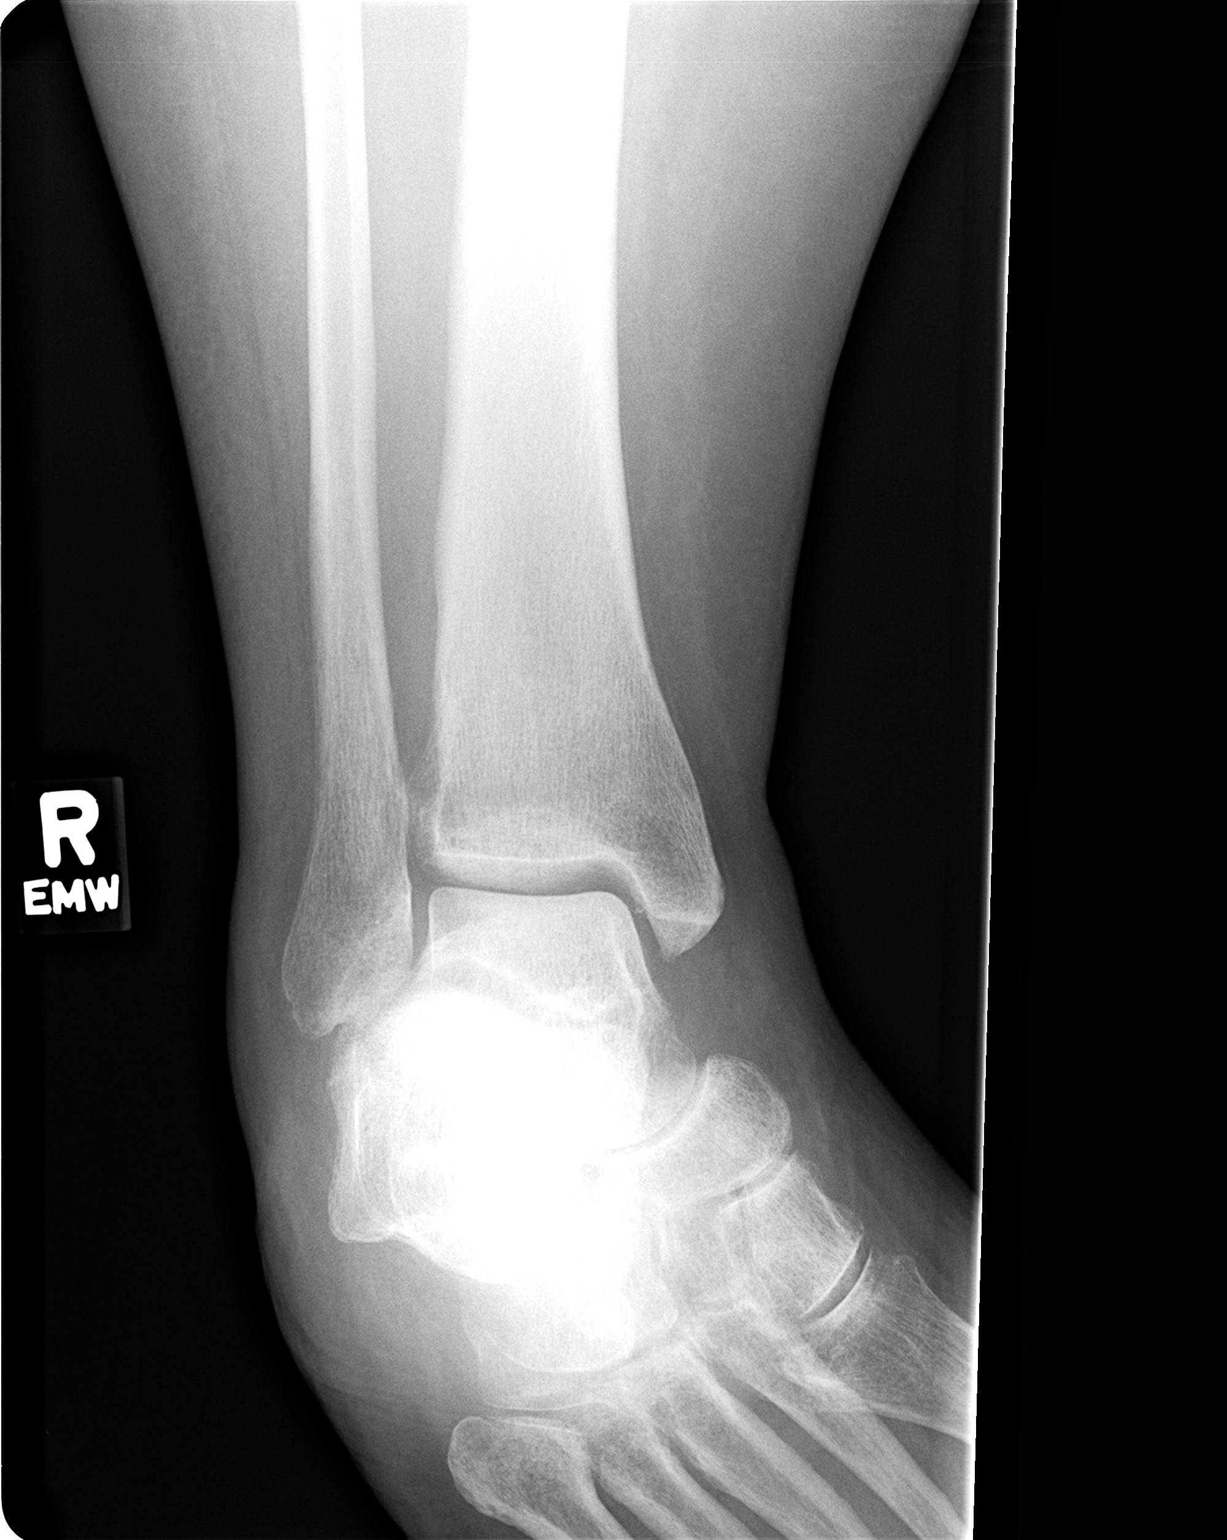

[view not recorded (3 of 3)]
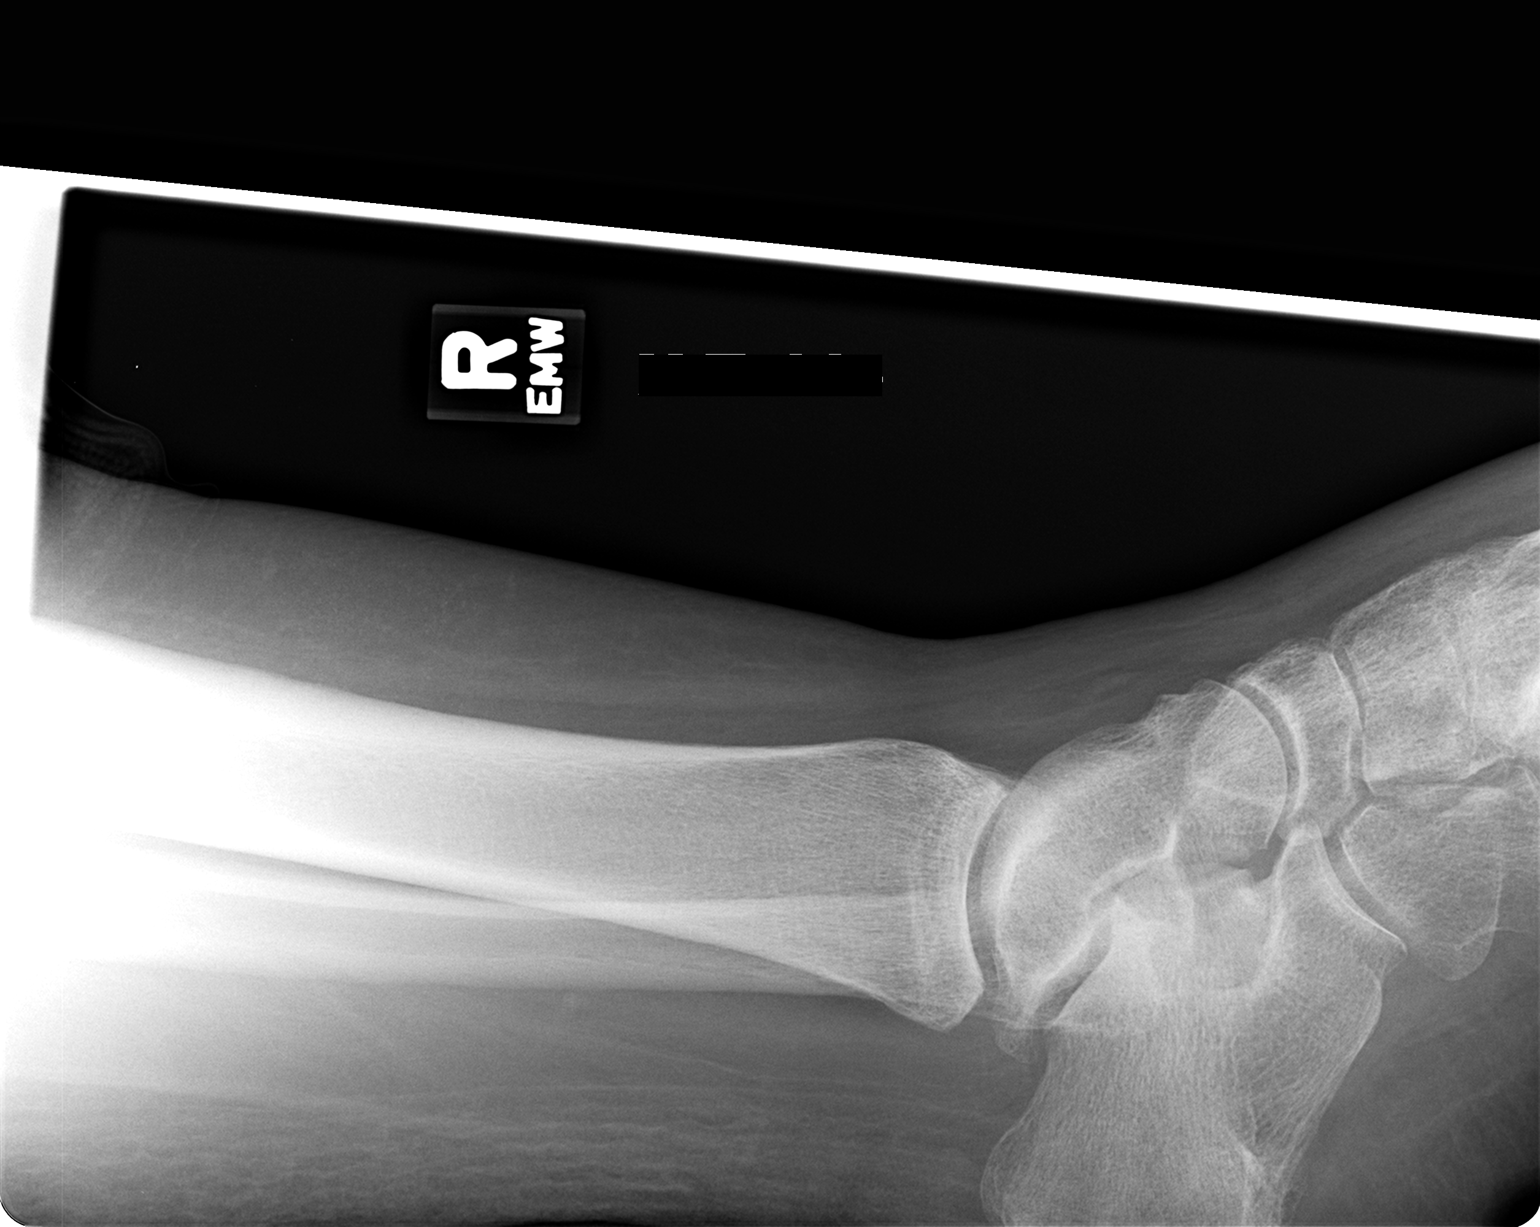

[3 of 3 positions shown; findings below may reference images not displayed]

IMPRESSION: Osteopenia with generalized soft tissue prominence.  No acute or focal osseous abnormalities are seen.

## 2003-12-30 ENCOUNTER — Inpatient Hospital Stay (HOSPITAL_COMMUNITY): Admission: EM | Admit: 2003-12-30 | Discharge: 2004-01-06 | Payer: Self-pay | Admitting: Oncology

## 2003-12-30 IMAGING — CT CT ANGIO CHEST
1 of 3 series · 19 of 32 positions shown · IV contrast (omnipaque)
Comparison: None.

CLINICAL DATA: Shortness of breath, fever, pancytopenia.
CHEST CT ANGIOGRAPHY WITH CONTRAST [DATE]
TECHNIQUE: Multidetector CT imaging of the chest was performed according to the protocol for detection of pulmonary embolism during IV bolus injection of 150 cc Omnipaque 300.  Coronal and sagittal plane reformatted images were also generated.

[Series 4: chest/pe 1.0 b10f · axial · 0.93mm/px · z∈[+1730,+1918]mm · 19 of 414 slices shown]
[im 19/414  lung]
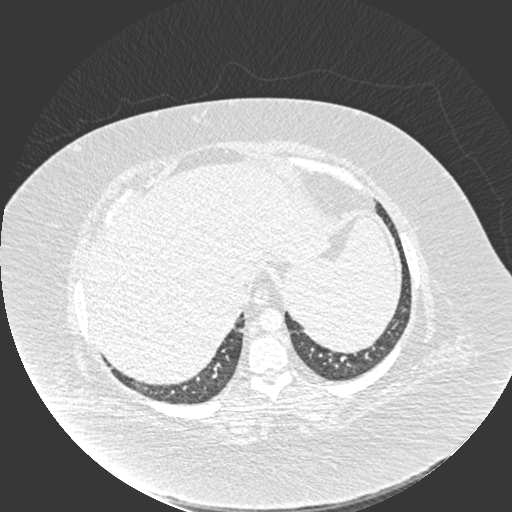
[im 38/414  mediastinal]
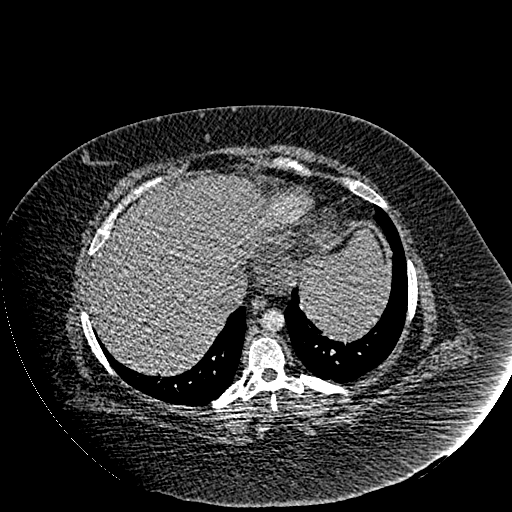
[im 76/414  lung]
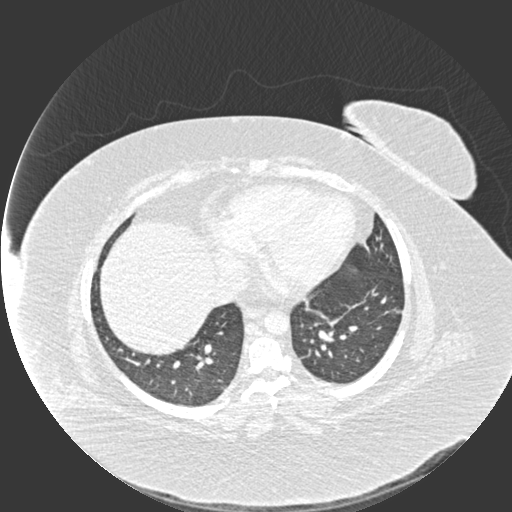
[im 79/414  mediastinal]
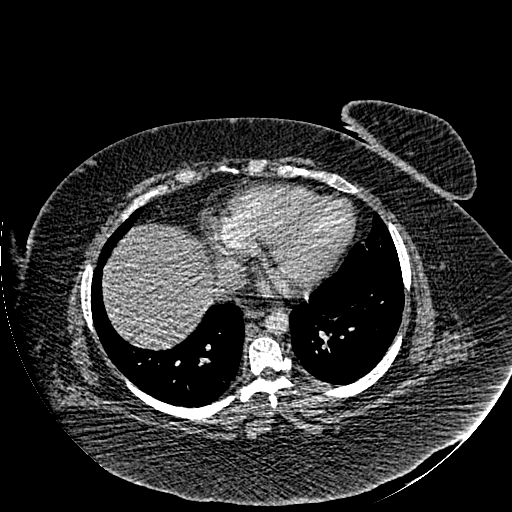
[im 94/414  lung]
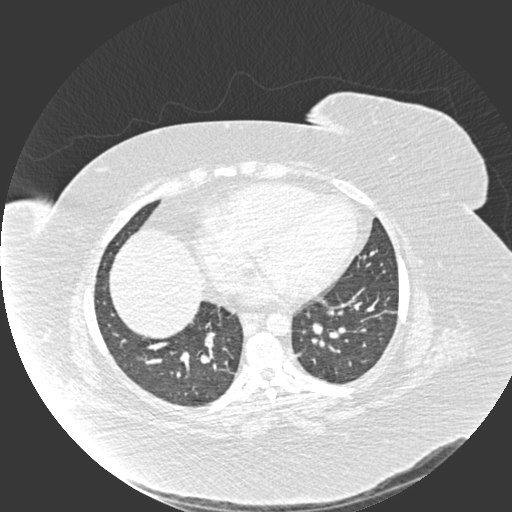
[im 113/414  mediastinal]
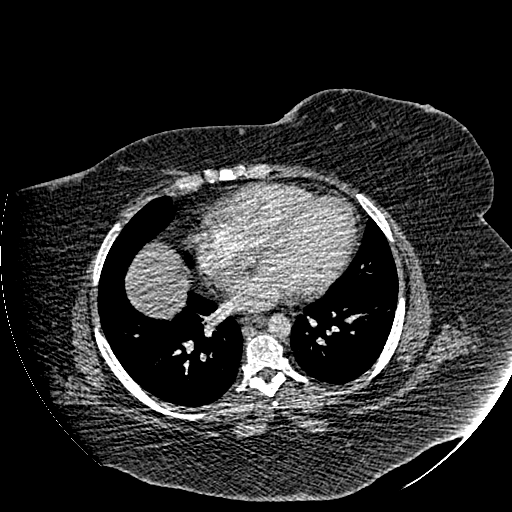
[im 138/414  lung]
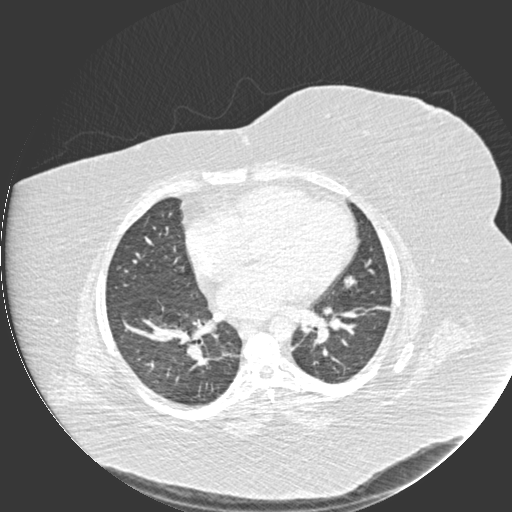
[im 151/414  mediastinal]
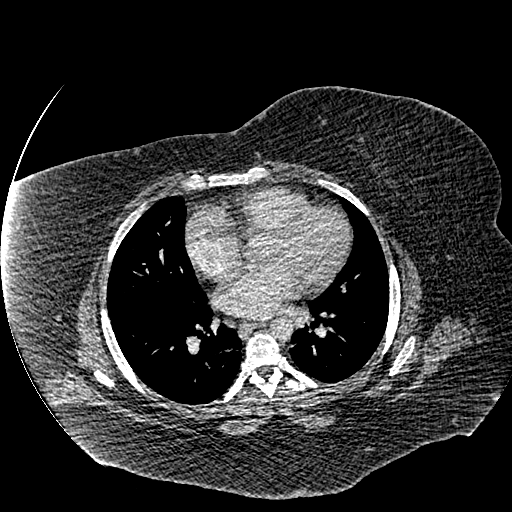
[im 169/414  lung]
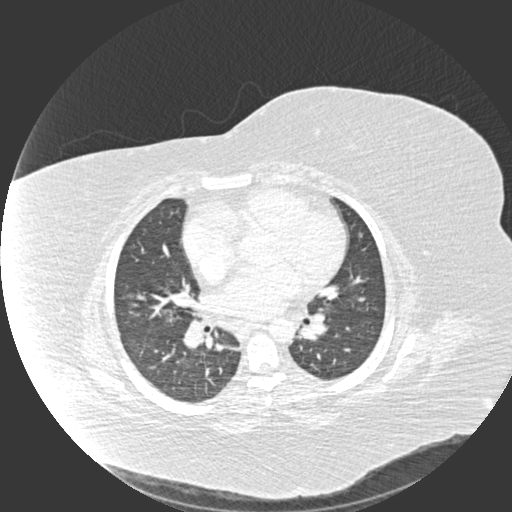
[im 207/414  mediastinal]
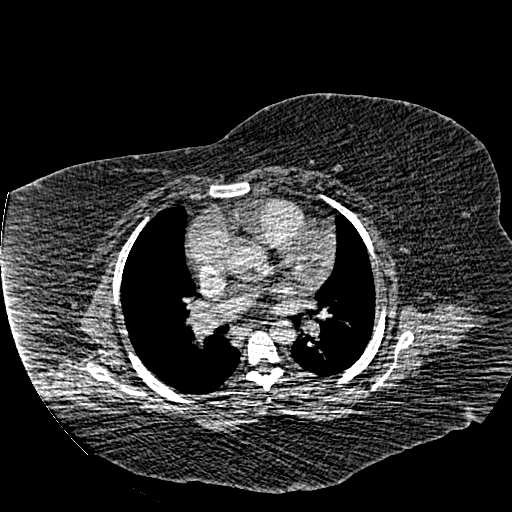
[im 226/414  lung]
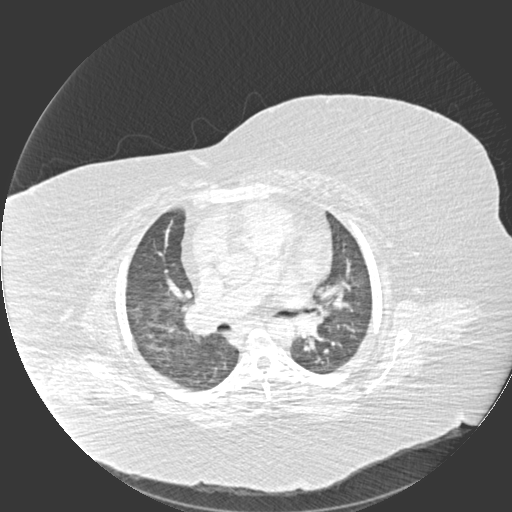
[im 245/414  mediastinal]
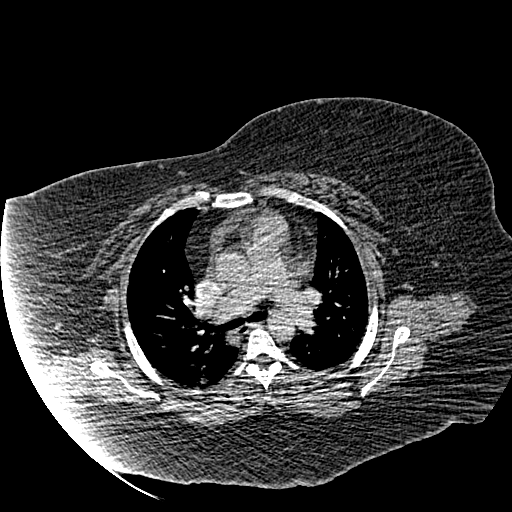
[im 263/414  lung]
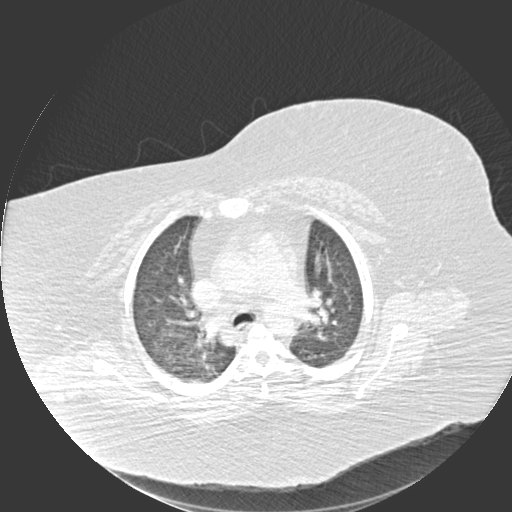
[im 282/414  mediastinal]
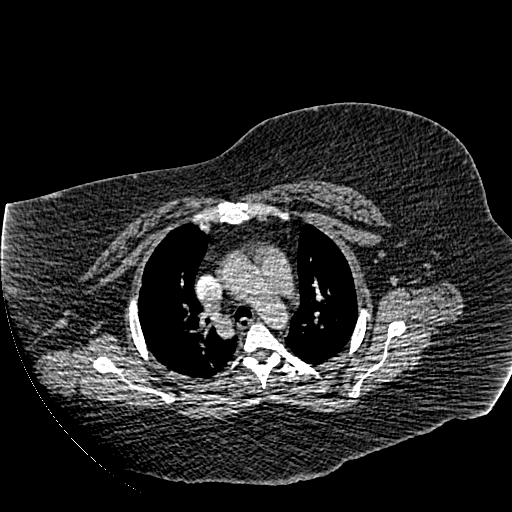
[im 301/414  lung]
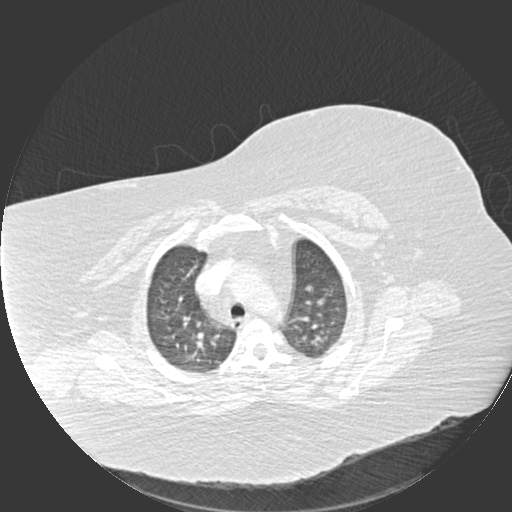
[im 320/414  mediastinal]
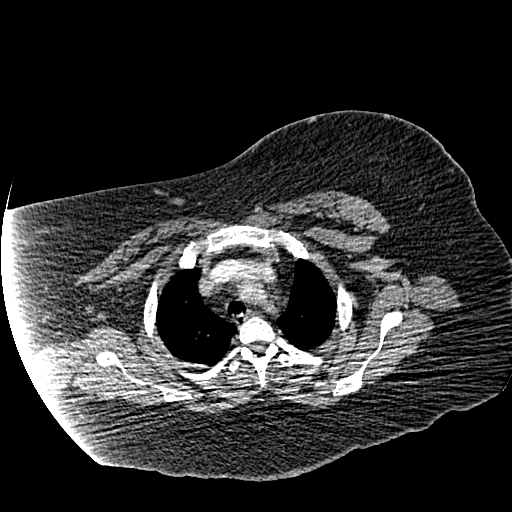
[im 338/414  lung]
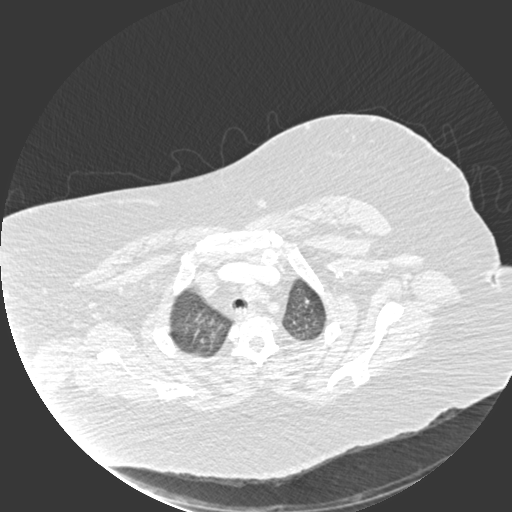
[im 376/414  mediastinal]
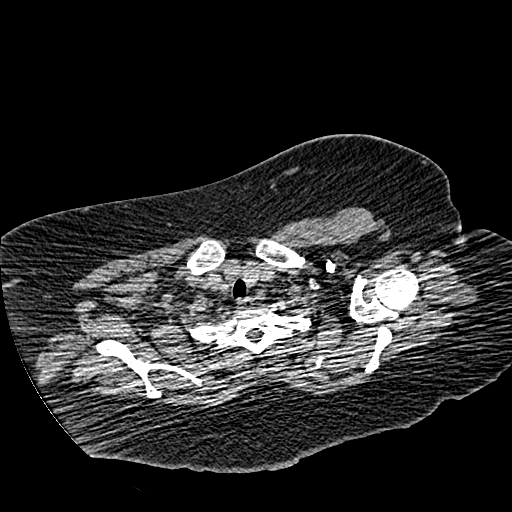
[im 395/414  lung]
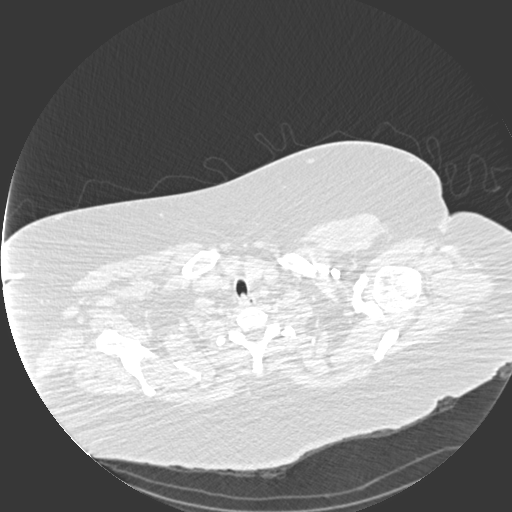

[19 of 32 positions shown; findings below may reference images not displayed]

FINDINGS: Study quality is markedly limited by bolus timing.  There is no large central pulmonary embolus, but the interlobar and lobar pulmonary arteries are not reliably evaluated.  Image quality is degraded by the patient?s large body habitus. 
The heart is enlarged.  No pericardial or pleural fluid collections.  Central airspace disease is identified in the right upper lobe with spiculated opacity anterior to the major fissure on the right.  Linear atelectasis or scarring is seen in the left base.  Image quality is further degraded by respiratory motion.  
IMPRESSION
The study is very limited in assessment for pulmonary embolus.  There is no large embolus in the main pulmonary outflow tract and probably not within either main pulmonary artery, but interlobar and more distal pulmonary arteries are not reliably assessed. 
New airspace disease within the right upper lobe worrisome for interval development of  pneumonia.

## 2003-12-31 IMAGING — CR DG CHEST 1V PORT
1 series · 1 of 1 positions shown · non-contrast
Comparison: none

CLINICAL DATA: 33-year-old with shortness of breath.
 CHEST PORTABLE, ONE VIEW 
 A single portable semi-upright view of the chest is compared with the previous study from [DATE].
 The patient is quite large, and the film is slightly under-penetrated because of the portable technique.  The heart is enlarged.  There is central vascular congestion and probable mild edema.  No definite pleural effusions. 
 IMPRESSION
 Global cardiac enlargement with central vascular congestion and possible mild edema.

[view not recorded]
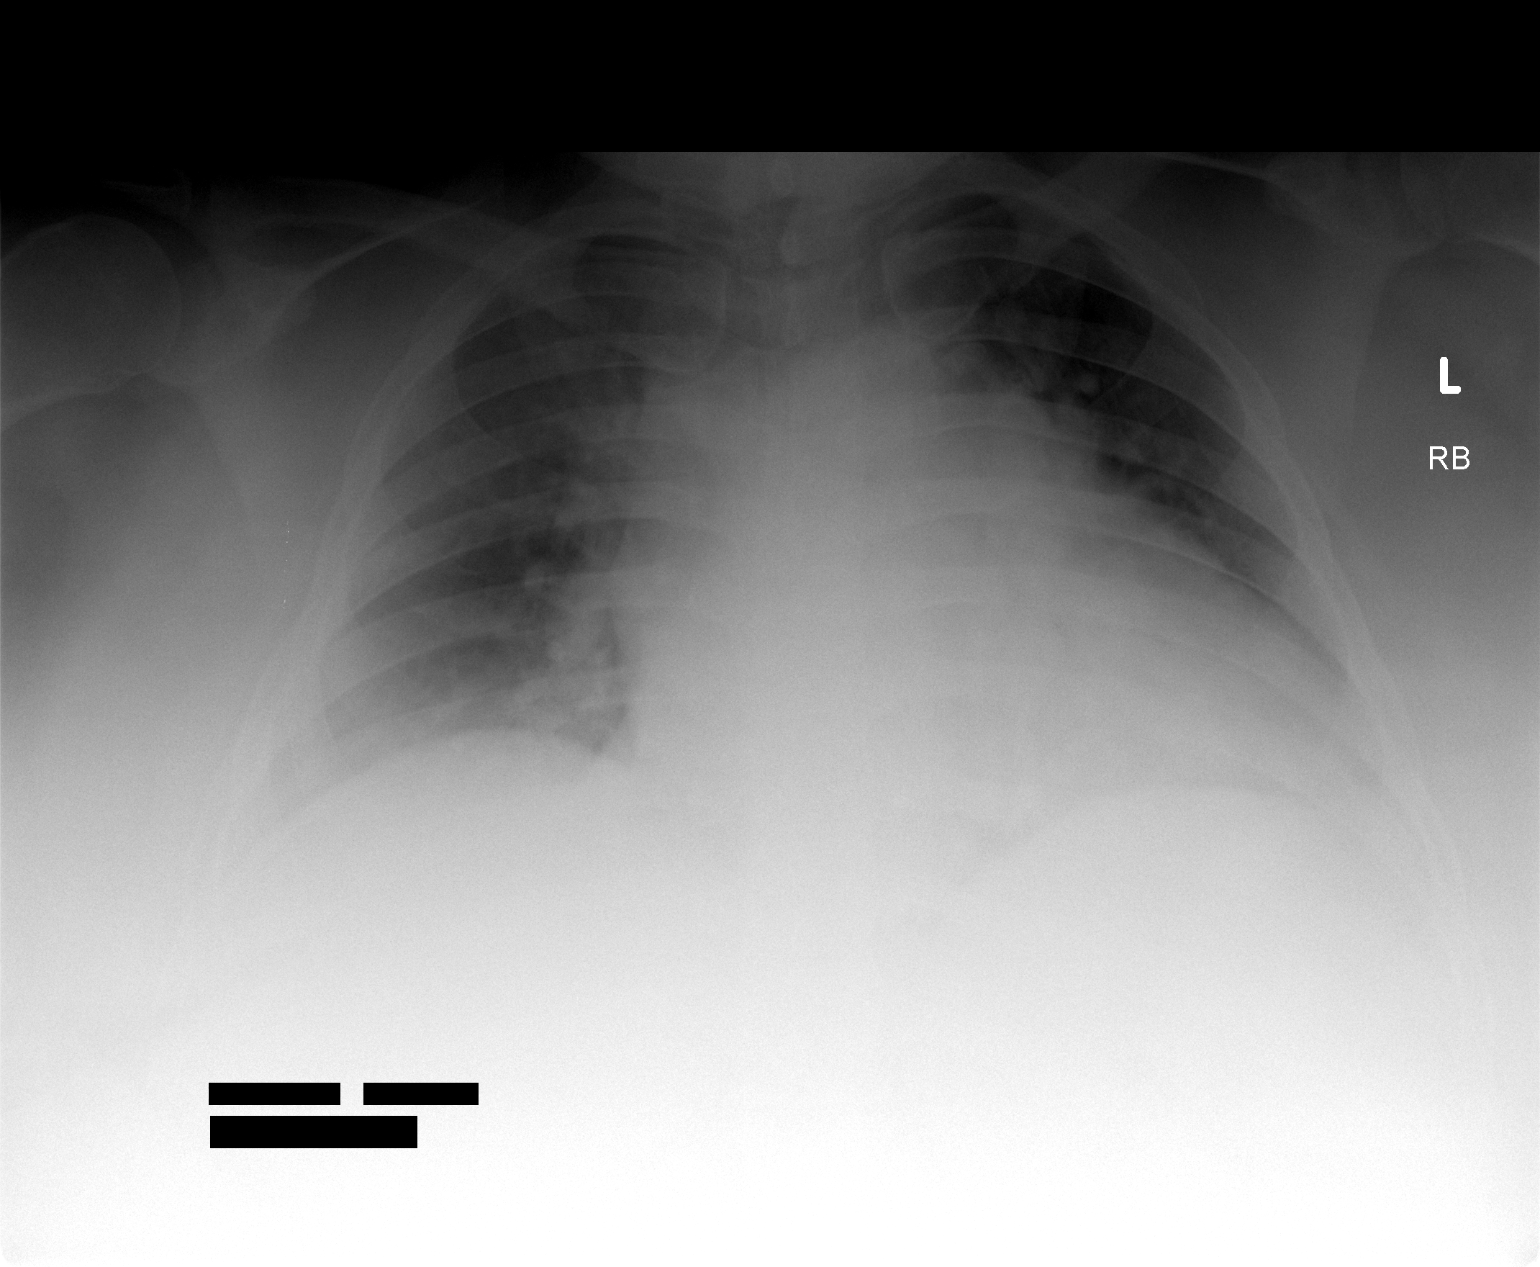

[1 of 1 positions shown; findings below may reference images not displayed]

## 2004-01-05 IMAGING — CR DG CHEST 2V
4 series · 4 of 4 positions shown · non-contrast
Comparison: none

CLINICAL DATA: 33-year-old with shortness of breath.  Fever.  Pancytopenia.  
 CHEST (TWO VIEWS)
 Two views of the chest compared to prior film from [DATE].  Film was under penetrated due to the patient?s size.  The heart is markedly enlarged but stable.  There is central vacular congestion.  No frank pulmonary edema or definite pulmonary infiltrates.  
 IMPRESSION
 1.  Limited exam due to patient?s size.  
 2.  Cardiac enlargement with vascular congestion.  No frank pulmonary edema or definite pulmonary infiltrate.

[view not recorded (1 of 4)]
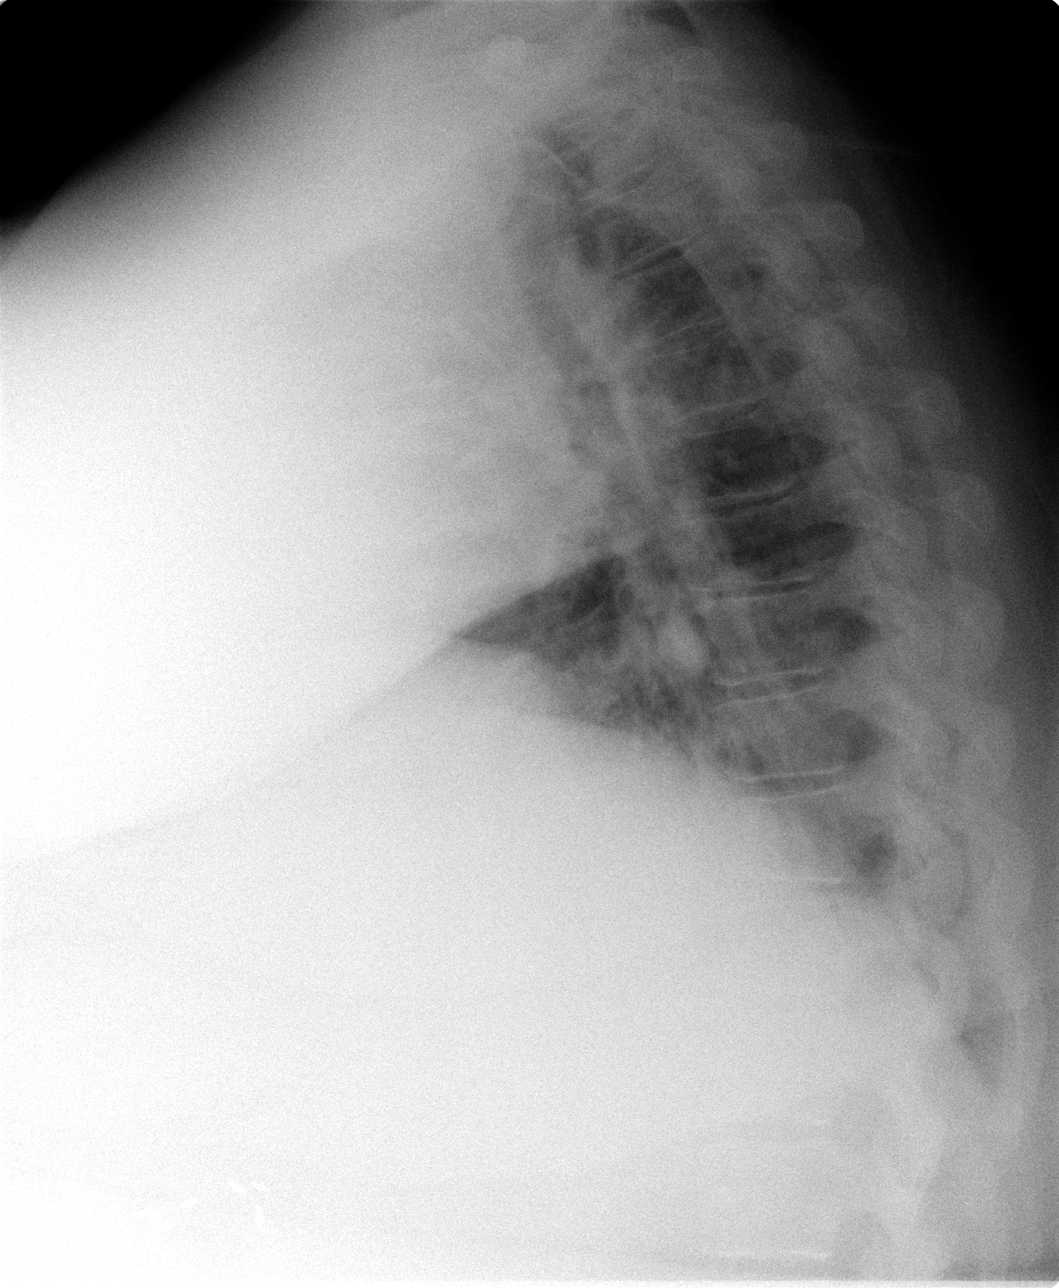

[view not recorded (2 of 4)]
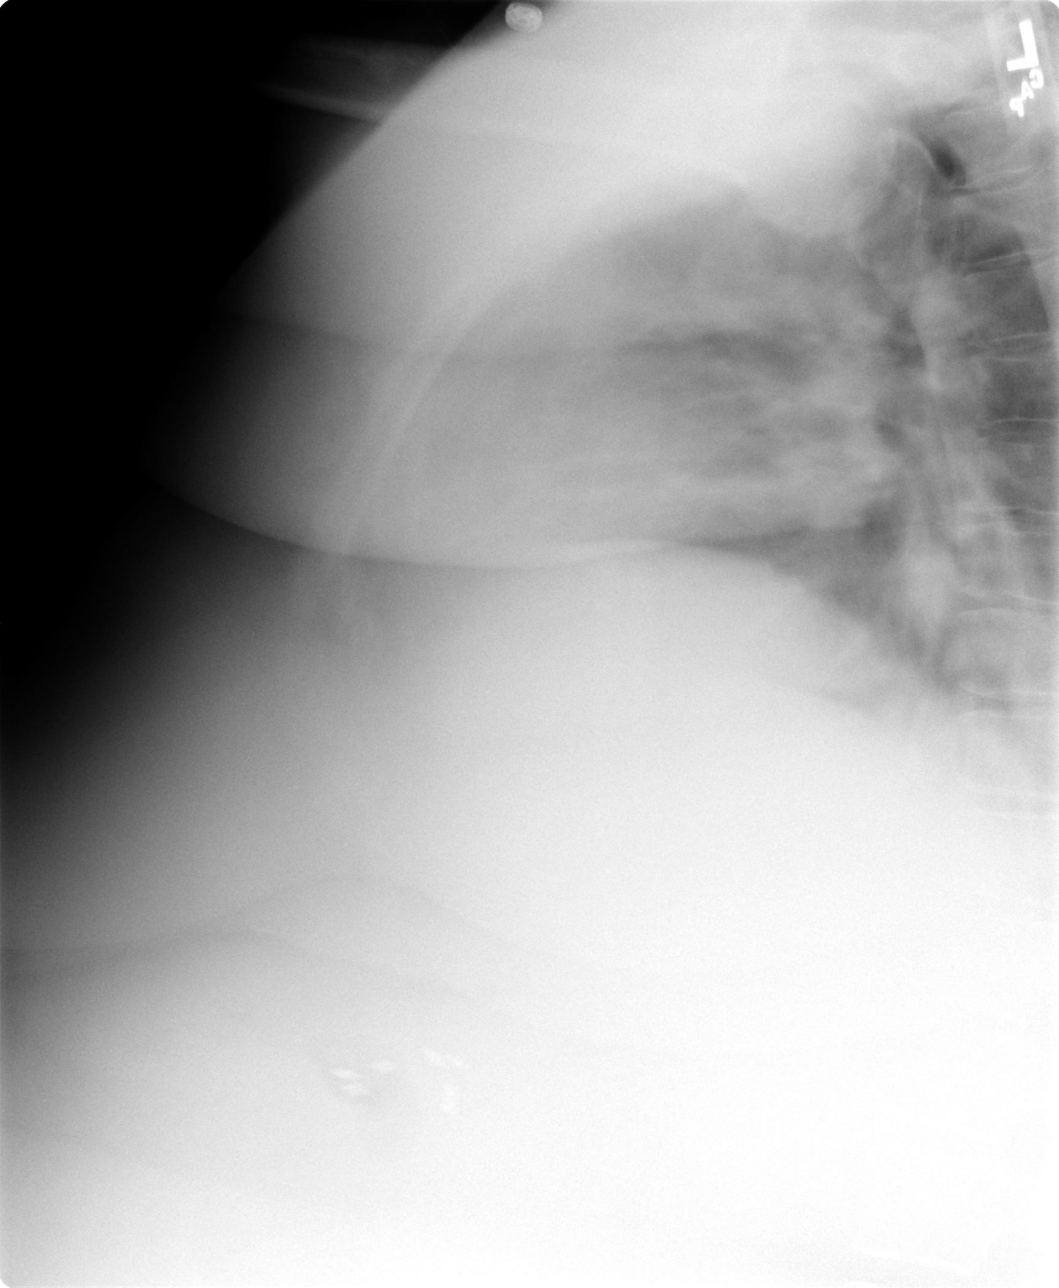

[view not recorded (3 of 4)]
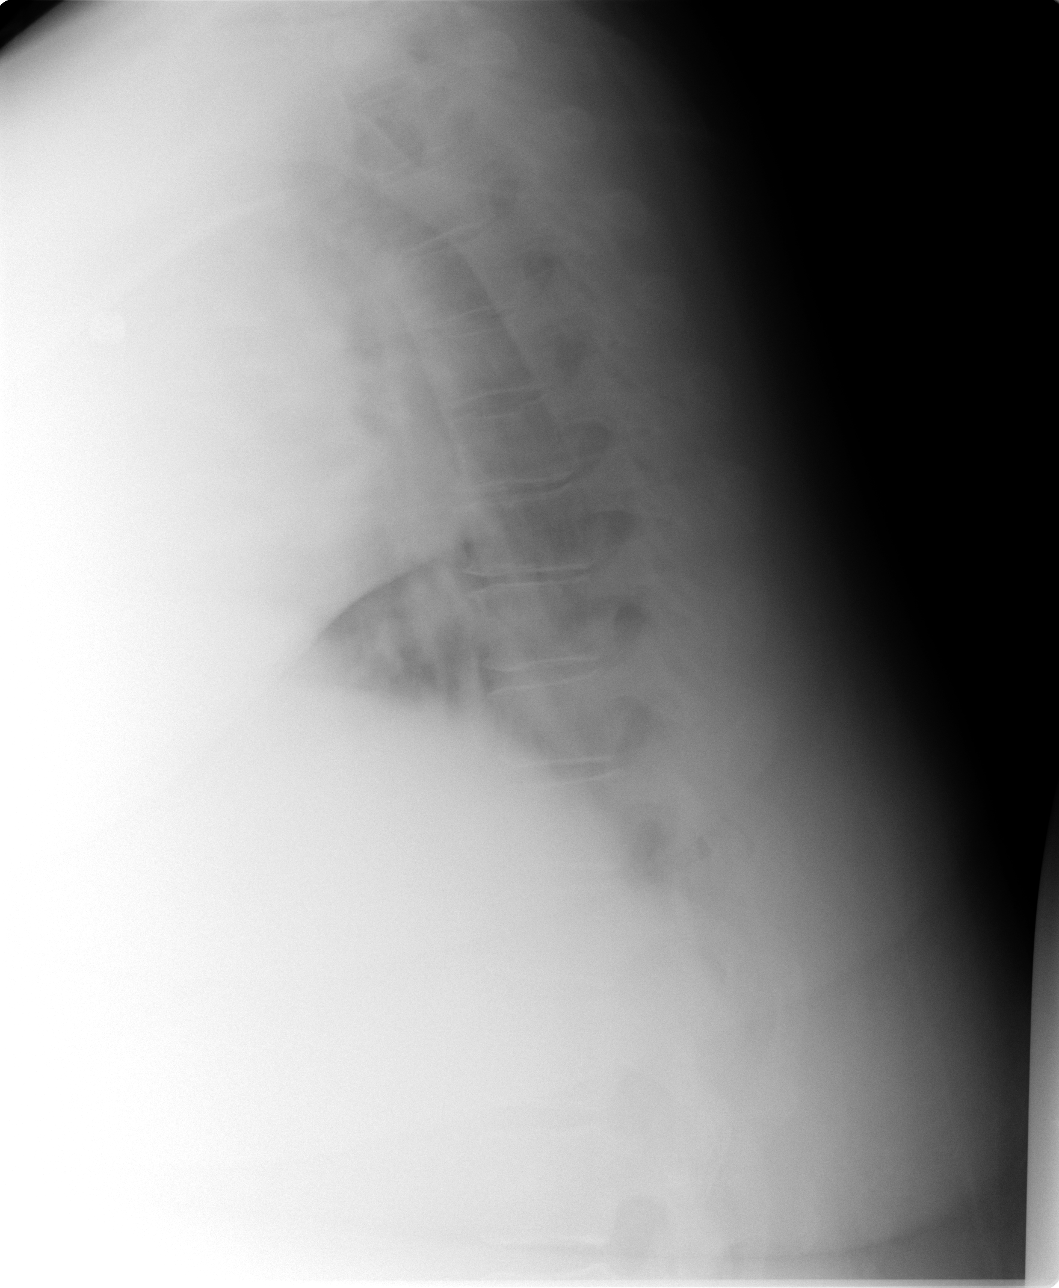

[view not recorded (4 of 4)]
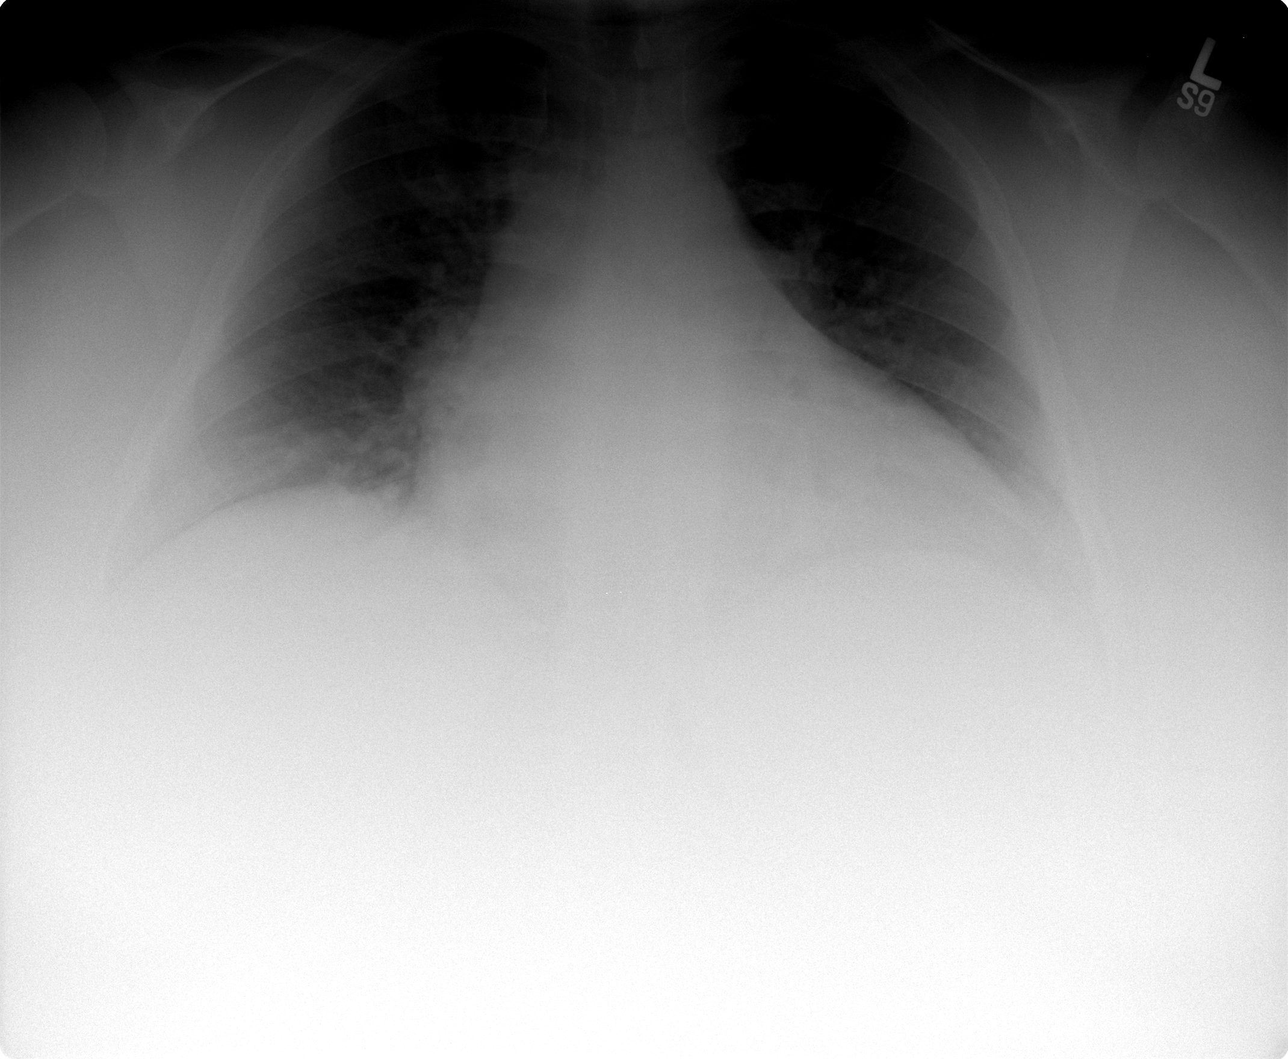

[4 of 4 positions shown; findings below may reference images not displayed]

## 2004-01-05 IMAGING — US US ART/VEN ABD/PELV/SCROTUM DOPPLER COMPLETE
1 series · 13 of 25 positions shown · non-contrast
Comparison: none

HISTORY: Pancytopenia, question splenomegaly and portal hypertension

[Series 1: unknown · 0.33mm/px · 13 of 60 slices shown]
[im 1/60]
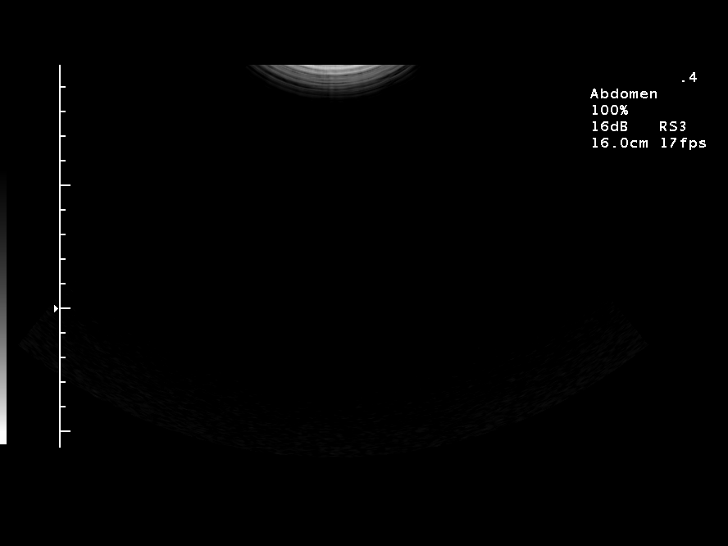
[im 5/60]
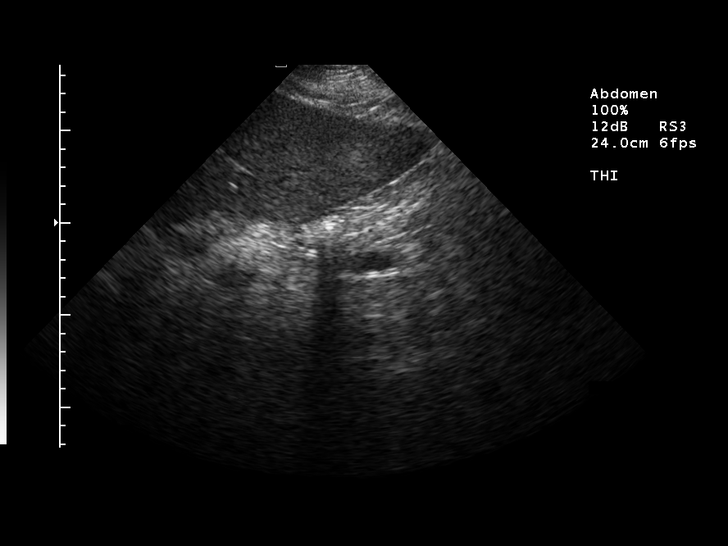
[im 10/60]
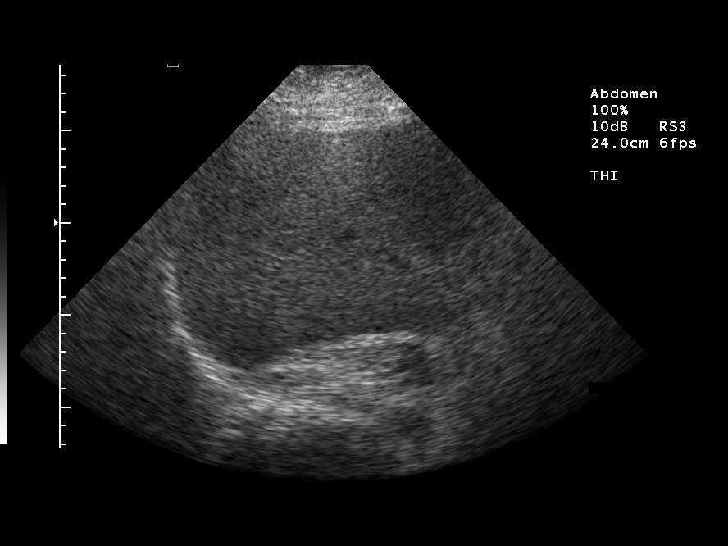
[im 15/60]
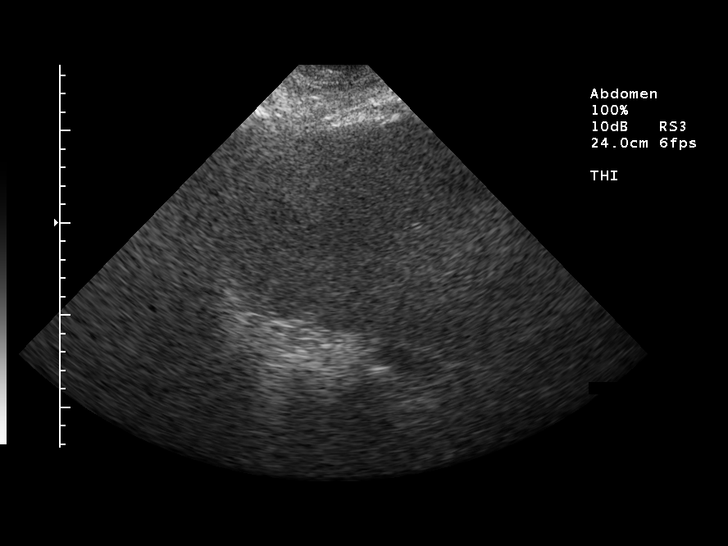
[im 20/60]
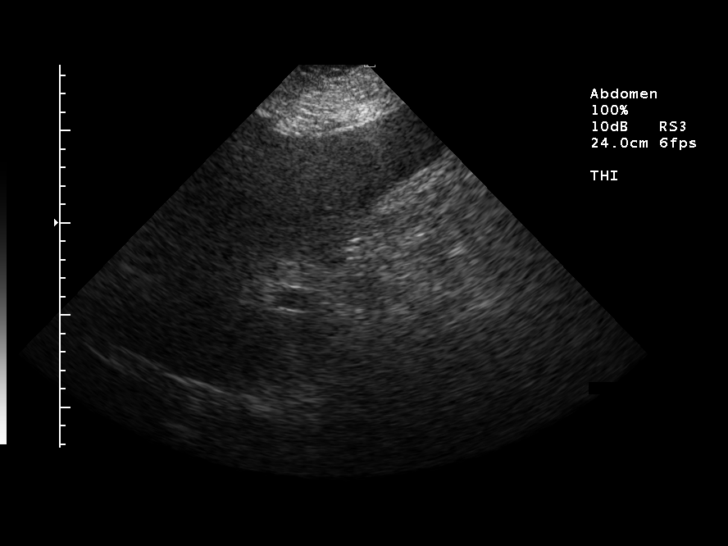
[im 25/60]
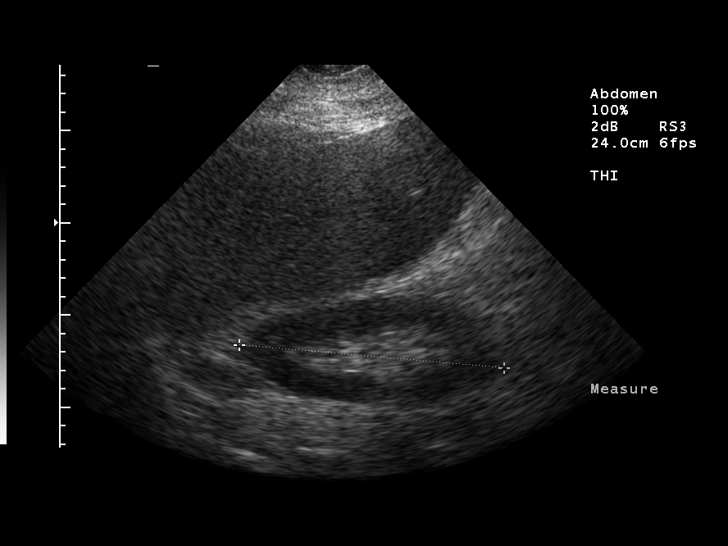
[im 30/60]
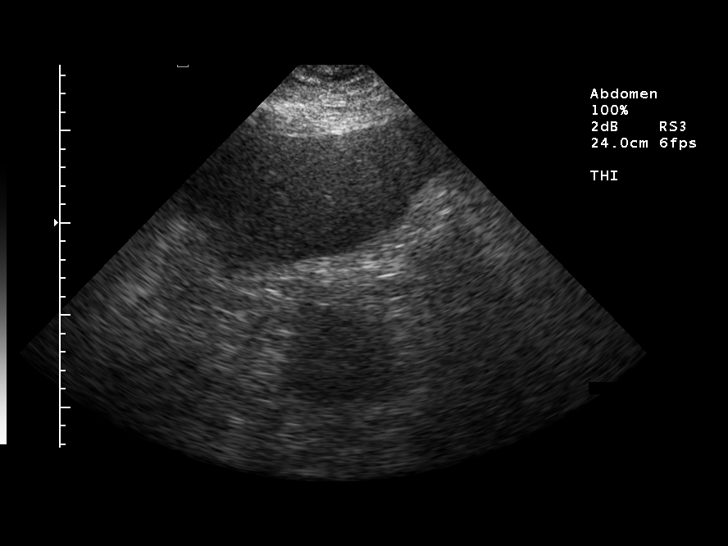
[im 35/60]
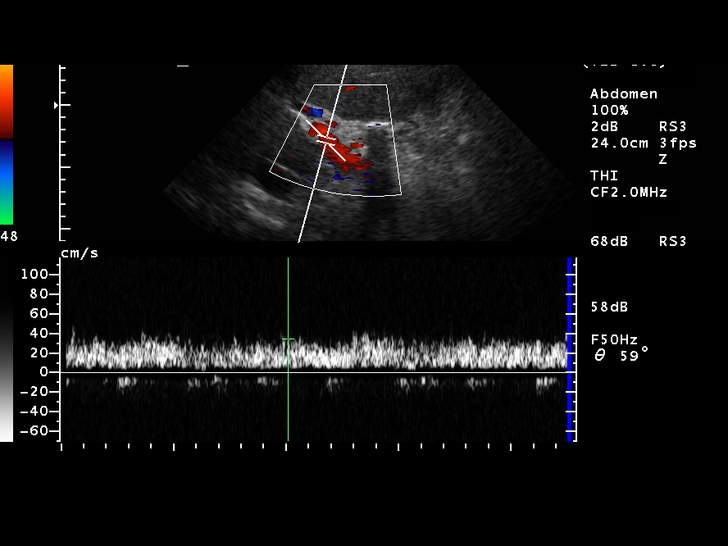
[im 40/60]
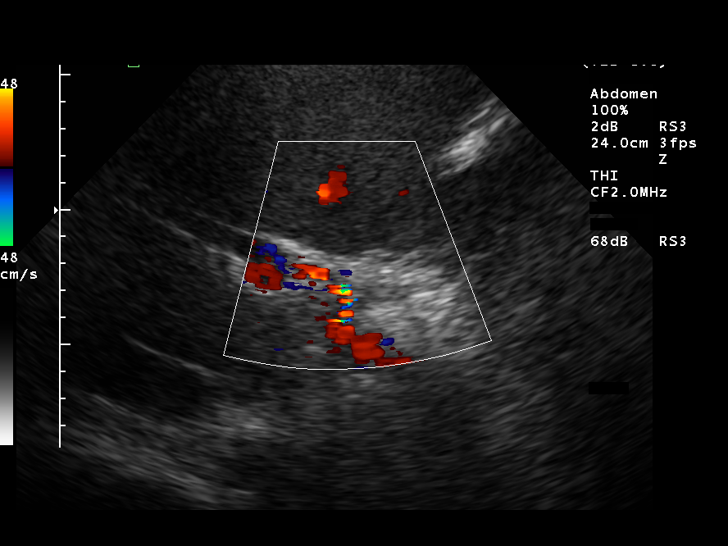
[im 45/60]
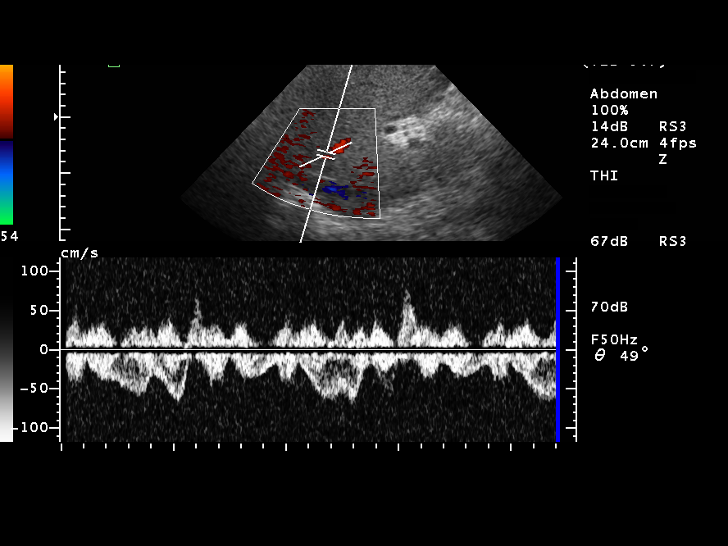
[im 50/60]
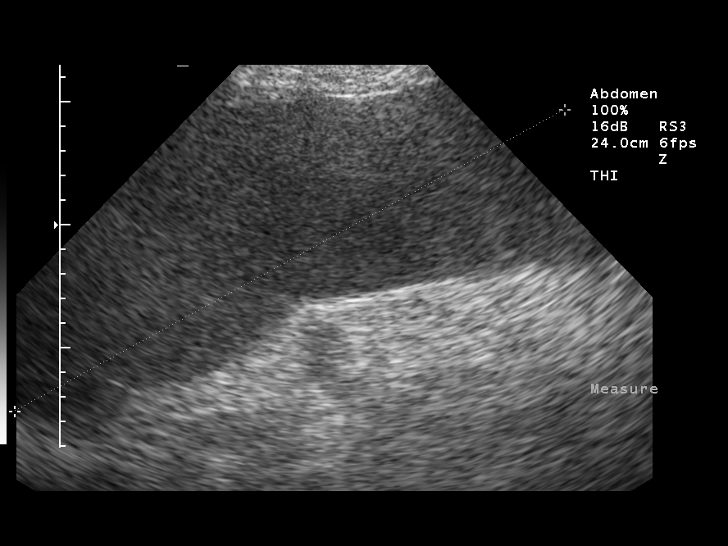
[im 55/60]
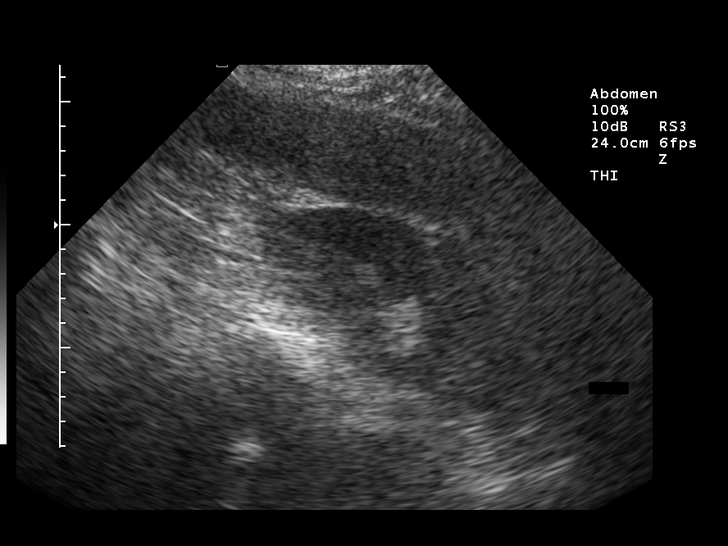
[im 60/60]
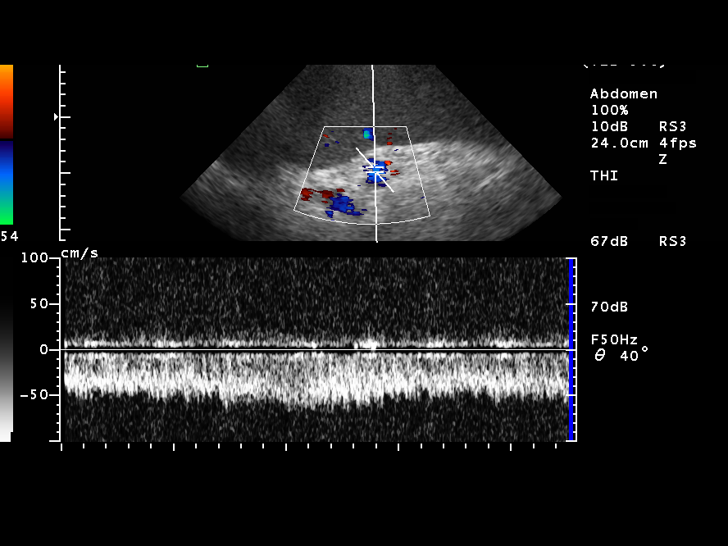

[13 of 25 positions shown; findings below may reference images not displayed]

ABDOMEN ULTRASOUND PORTABLE

Portable exam performed at [NK] hours.

Status post cholecystectomy.
CBD normal caliber 5 mm diameter.
Liver enlarged, 20 cm length, slightly increased echogenicity.
Limited visualization of the aorta, pancreas, and inferior vena cava.
Splenomegaly, spleen measuring 26 cm length.
No focal lesions of the liver or spleen.
Kidneys large in size but otherwise normal appearance, 14.4 cm length right and 14.8 cm length
left.
No free fluid.
IMPRESSION: Hepatosplenomegaly. Cholecystectomy. Poor visualization of the pancreas aorta and IVC.

ULTRASOUND VISCERAL DOPPLER EVALUATION

Portal vein patent with hepatopetal flow, velocities ranging between 34 and 45   cm/sec.
Intrahepatic portal venous flow also hepatopetal measuring 43 cm/sec right portal vein at 18 cm/sec
left portal vein.

Hepatic veins demonstrate phasic flow suggesting elevated right heart pressures.
Splenic vein patent with normal direction of flow, 35 cm/sec.
Intrahepatic IVC is patent.
Hepatic artery patent, with greatest velocity 193 cm/sec.
IMPRESSION: Normal direction of portal vein flow with portal vein 1.5 cm in diameter and velocities as
described above.
Patent hepatic veins and IVC. Patent splenic vein. No evidence of ascites or varices.

## 2004-02-29 ENCOUNTER — Ambulatory Visit: Payer: Self-pay | Admitting: Oncology

## 2004-07-18 ENCOUNTER — Ambulatory Visit: Payer: Self-pay | Admitting: Oncology

## 2004-10-17 IMAGING — CR DG CHEST 1V PORT
1 series · 1 of 1 positions shown · non-contrast
Comparison: none

HISTORY: Dyspnea, respiratory failure, anemia

PORTABLE CHEST ONE VIEW
Portable exam [CJ] hours compared to [DATE].
Cardiomegaly with vascular congestion.
No pulmonary infiltrate or effusion.
No pneumothorax.
Low lung volumes.

[view not recorded]
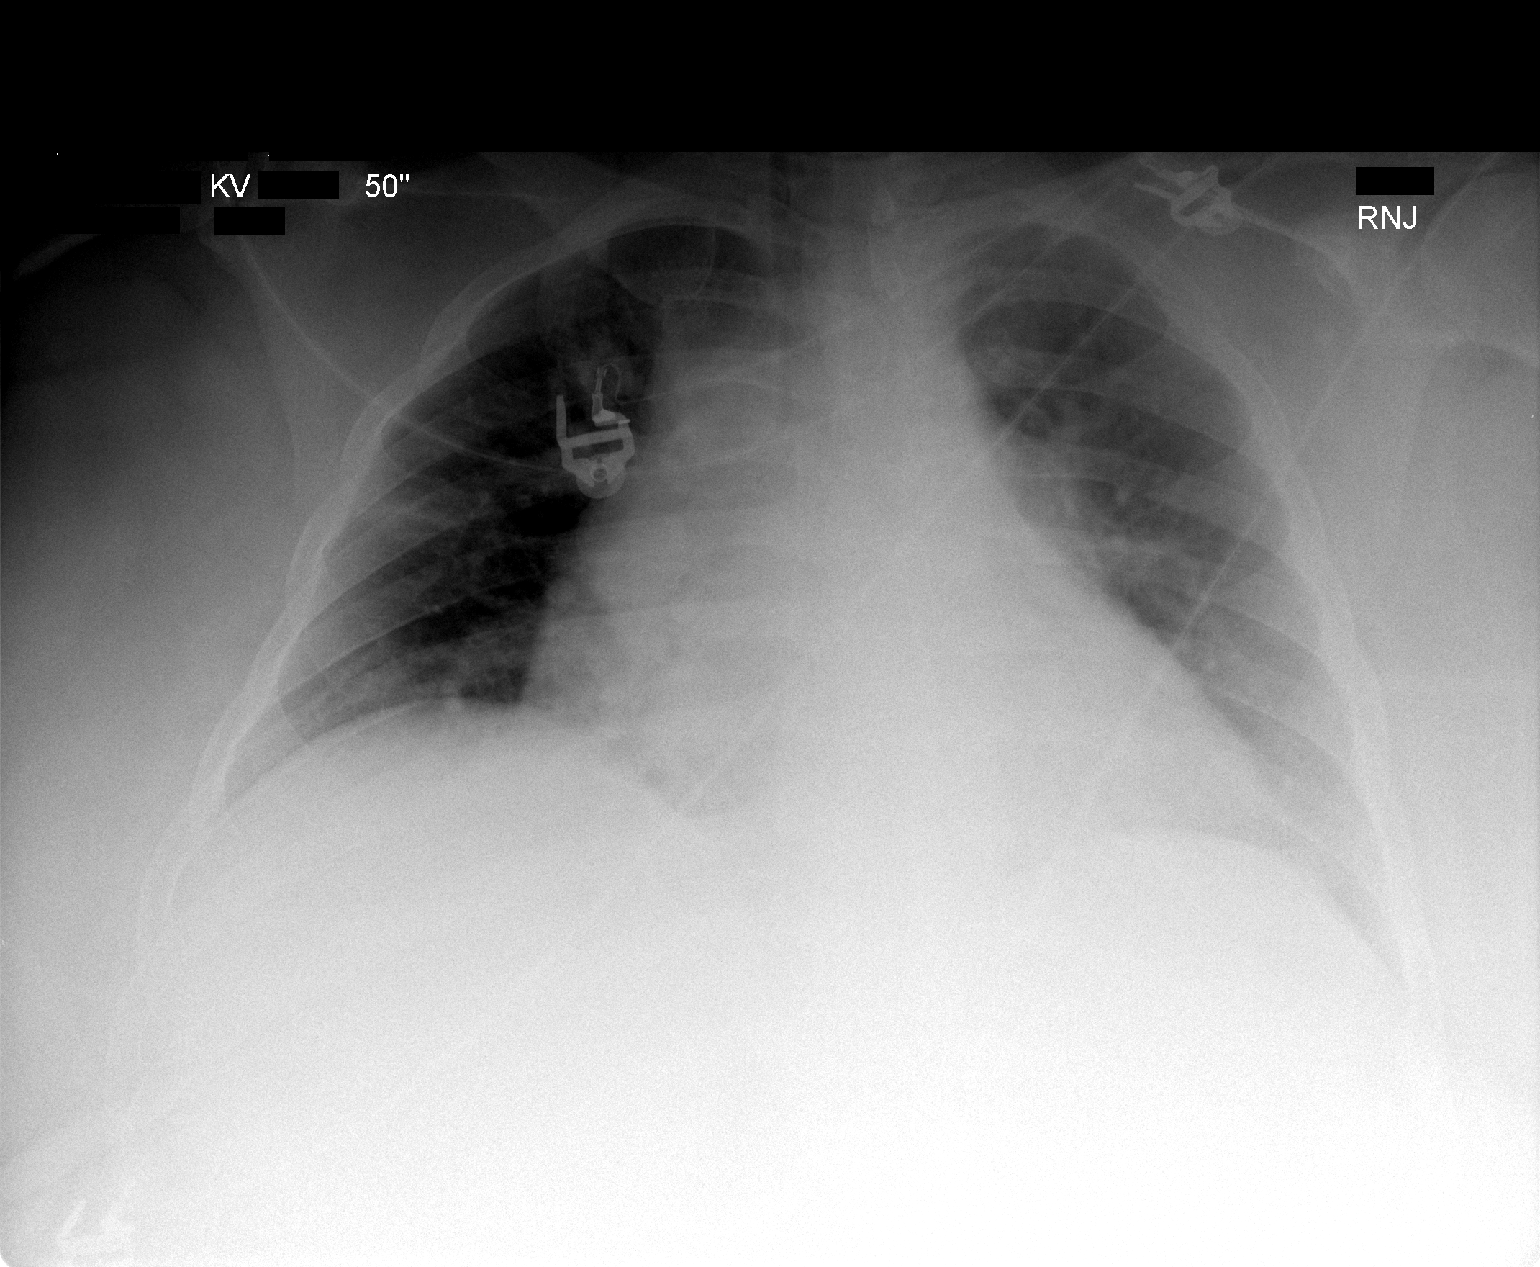

[1 of 1 positions shown; findings below may reference images not displayed]

IMPRESSION: Cardiomegaly. Low lung volumes without definite acute infiltrate.

## 2005-04-22 ENCOUNTER — Ambulatory Visit: Payer: Self-pay | Admitting: Oncology

## 2005-10-23 ENCOUNTER — Ambulatory Visit (HOSPITAL_COMMUNITY): Admission: RE | Admit: 2005-10-23 | Discharge: 2005-10-23 | Payer: Self-pay | Admitting: Unknown Physician Specialty

## 2008-04-21 ENCOUNTER — Inpatient Hospital Stay (HOSPITAL_COMMUNITY): Admission: EM | Admit: 2008-04-21 | Discharge: 2008-04-27 | Payer: Self-pay | Admitting: *Deleted

## 2008-04-21 IMAGING — CR DG CHEST 1V PORT
1 series · 1 of 1 positions shown · non-contrast
Comparison: [DATE].

CLINICAL DATA: Unresponsive.

PORTABLE CHEST - 1 VIEW

[AP]
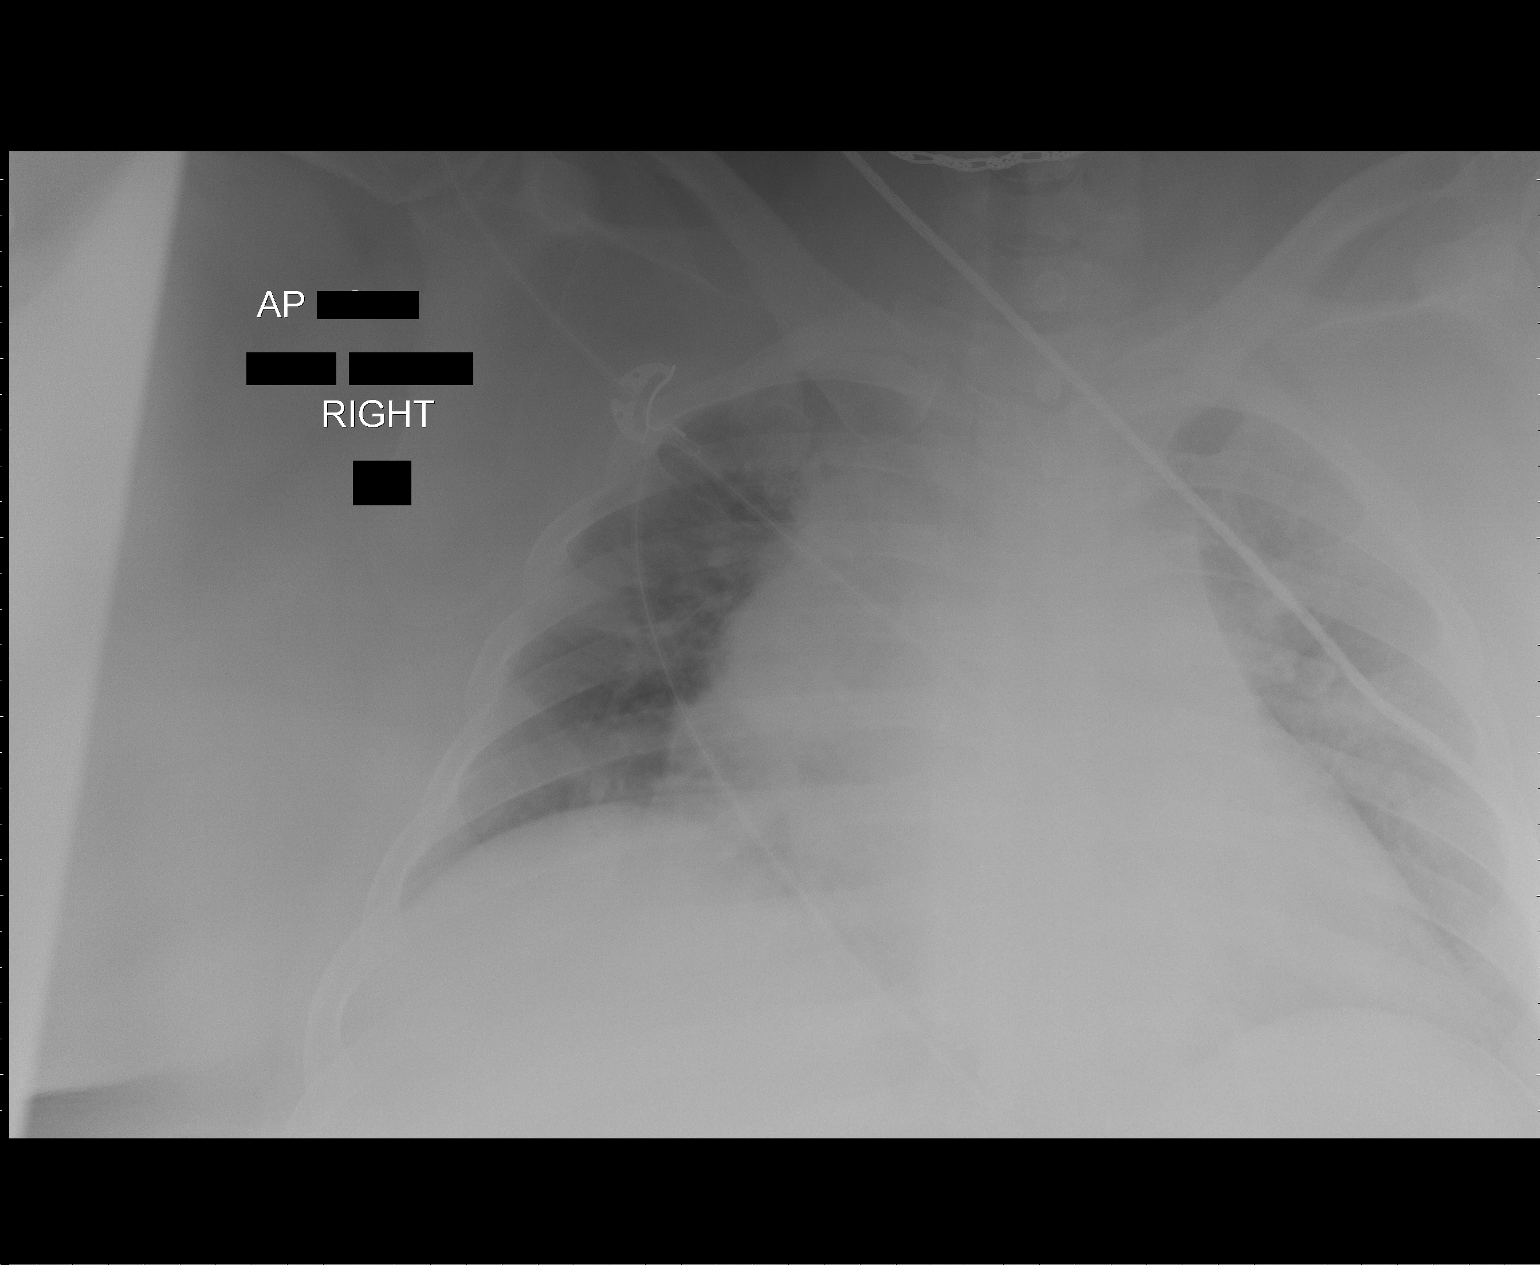

[1 of 1 positions shown; findings below may reference images not displayed]

FINDINGS: Suboptimal image due to large patient size and technique.

The heart is enlarged.  There is vascular congestion.  There is
mild bibasilar atelectasis.
IMPRESSION: Cardiac enlargement with mild vascular congestion.

Mild bibasilar atelectasis.

## 2008-04-21 IMAGING — CR DG CHEST 1V PORT
1 series · 1 of 1 positions shown · non-contrast
Comparison: Portable chest x-ray of [DATE]

CLINICAL DATA: Altered mental status, morbid obesity

PORTABLE CHEST - 1 VIEW

[view not recorded]
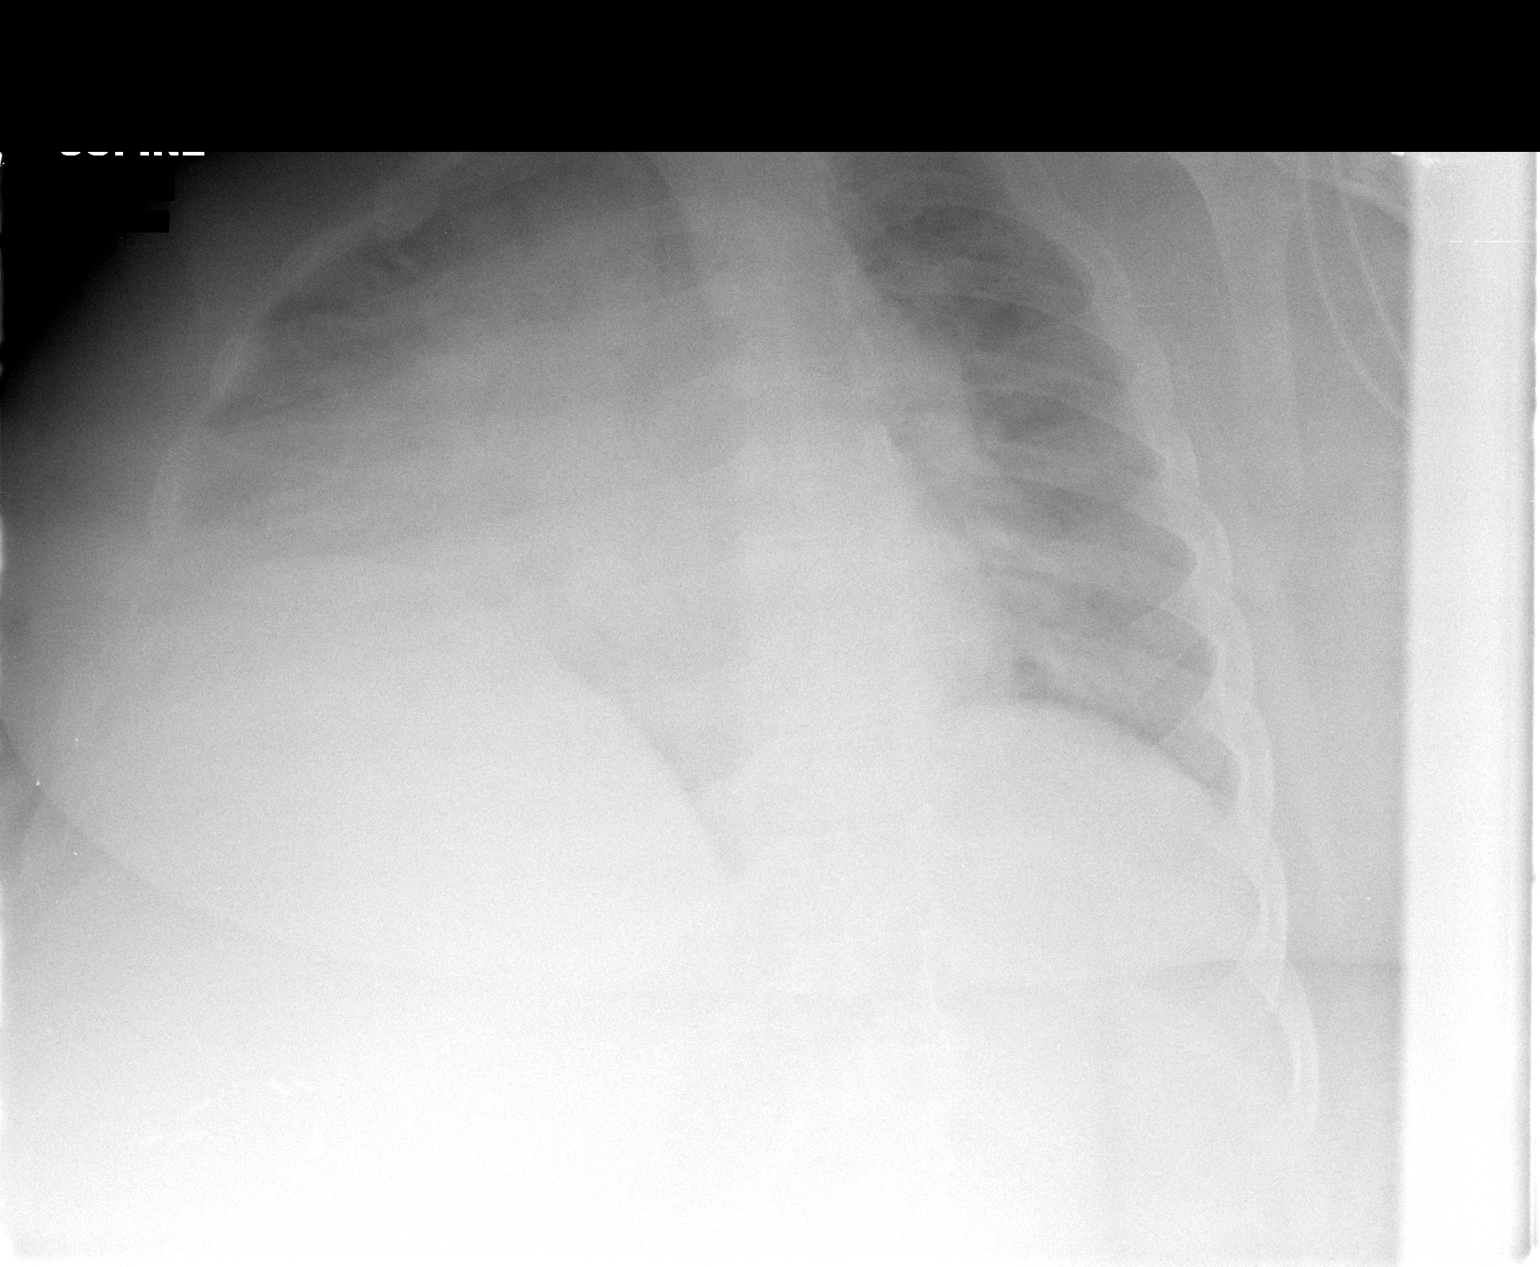

[1 of 1 positions shown; findings below may reference images not displayed]

FINDINGS: The best portable view possible was obtained in this
morbidly obese patient.  There is airspace disease throughout the
right lung, and pneumonia is a definite consideration.  The left
lung appears clear.  Heart size is stable.
IMPRESSION: Suboptimal portable chest x-ray but there does appear to be new
airspace disease throughout the right lung suspicious for
pneumonia.

## 2008-04-21 IMAGING — CT CT HEAD W/O CM
1 of 3 series · 10 of 30 positions shown, 13 images · non-contrast
Comparison: None available.

CLINICAL DATA: Patient unresponsive.

CT HEAD WITHOUT CONTRAST
TECHNIQUE: Contiguous axial images were obtained from the base of
the skull through the vertex without contrast.

[Series 4: head routine 4.8 h37s · axial · 0.43mm/px · z∈[+1302,+1474]mm · 10 of 42 slices shown, 13 images]
[im 4/42  brain]
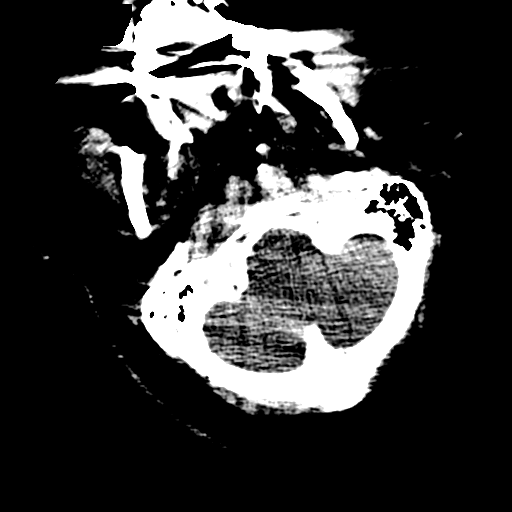
[im 4/42  bone]
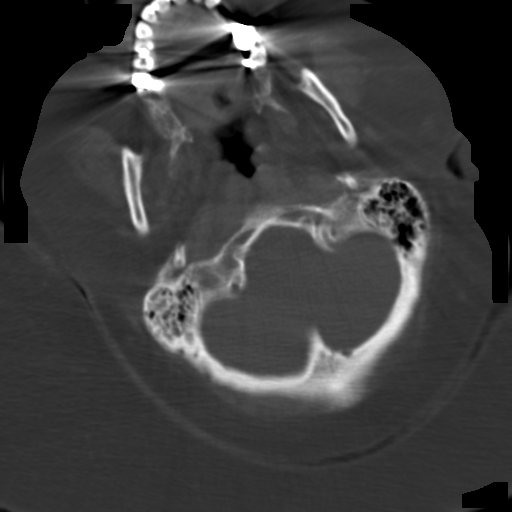
[im 8/42  brain]
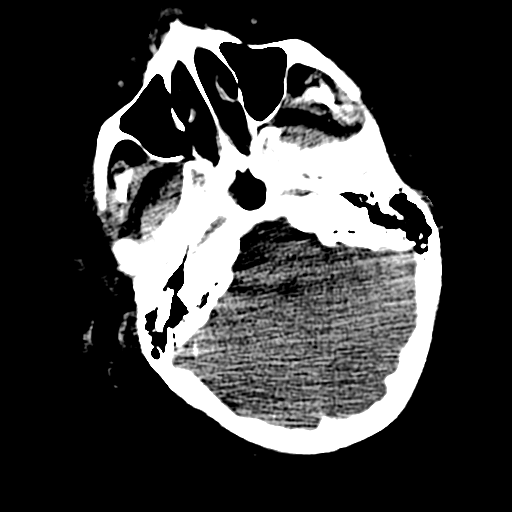
[im 12/42  brain]
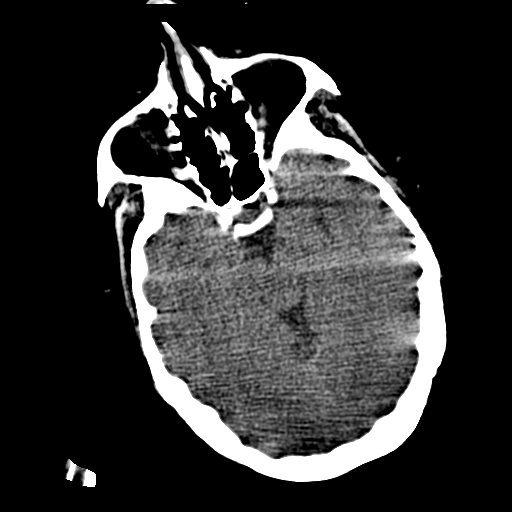
[im 15/42  brain]
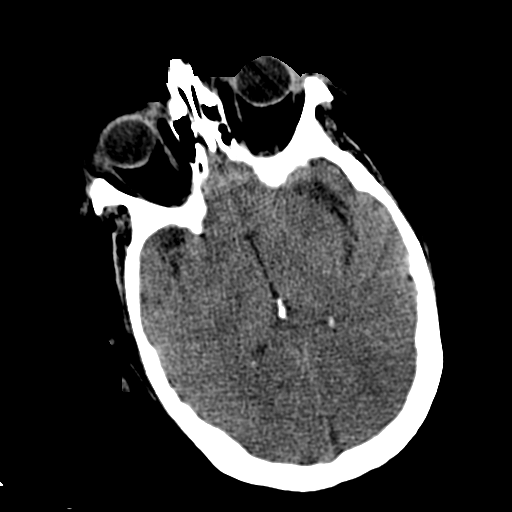
[im 19/42  brain]
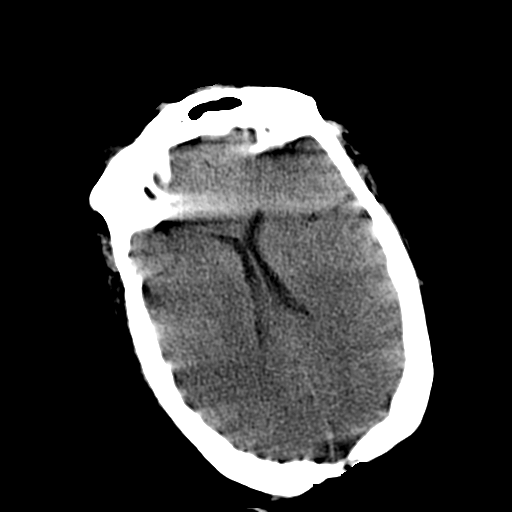
[im 19/42  bone]
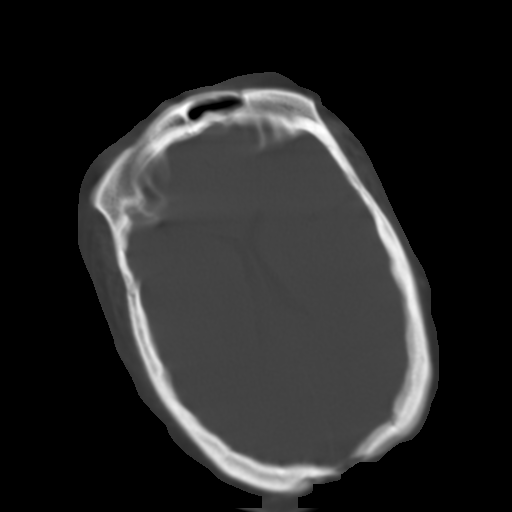
[im 23/42  brain]
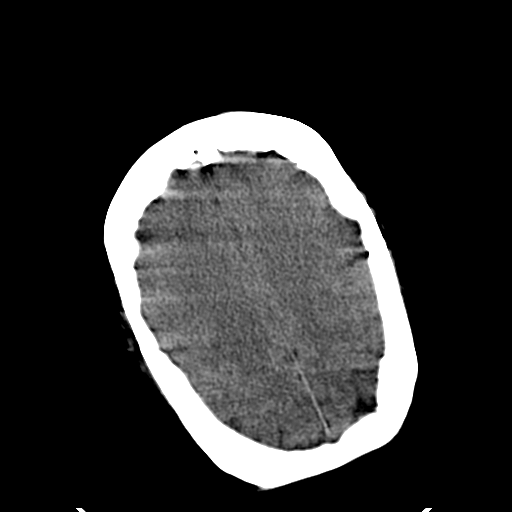
[im 27/42  brain]
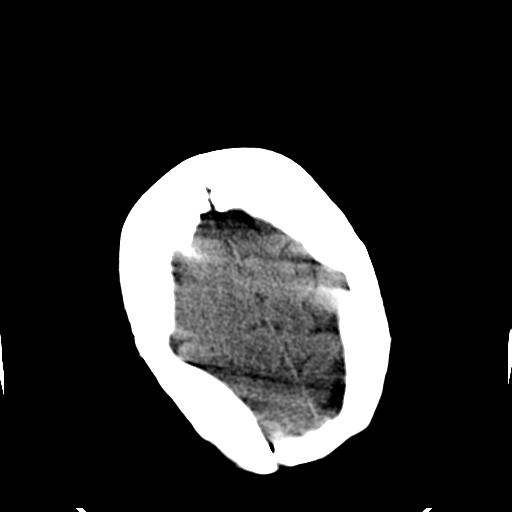
[im 30/42  brain]
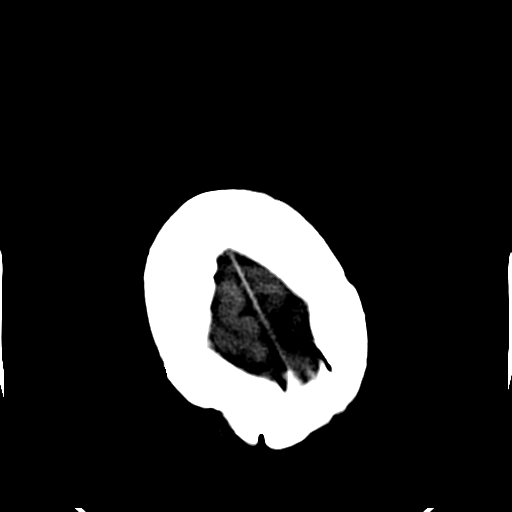
[im 34/42  brain]
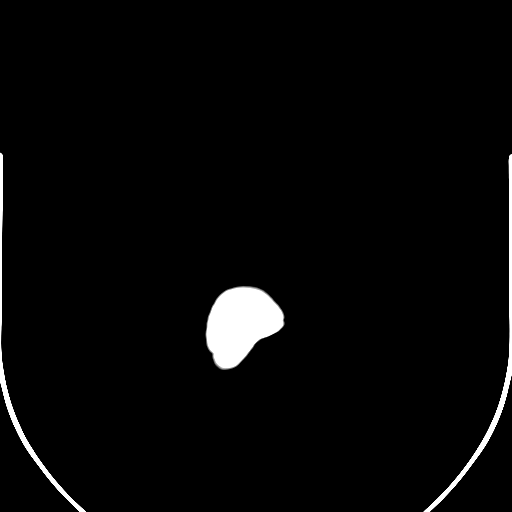
[im 34/42  bone]
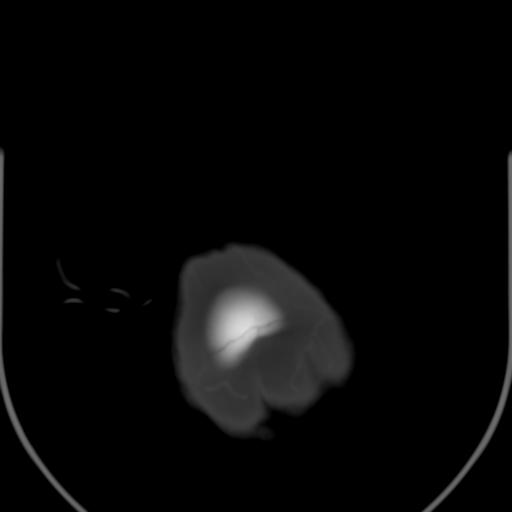
[im 38/42  brain]
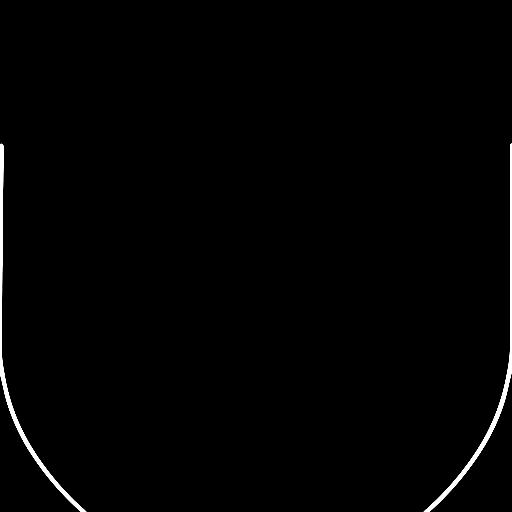

[10 of 30 positions shown; findings below may reference images not displayed]

FINDINGS: Study is somewhat limited by patient motion.  There is no
evidence of acute intracranial abnormality including hemorrhage,
infarct, mass, mass effect, midline shift or abnormal extra-axial
fluid collection.

Lucencies are seen through the posterior aspect of the calvarium
superiorly with appearances most suggestive of either remote
fracture or craniotomy defect.  Calvarium is otherwise
unremarkable.
IMPRESSION: 1.  No acute intracranial abnormality.
2.  Linear defects in the posterior aspect of the calvarium near
the vertex may be due to postoperative change or old trauma.

## 2008-04-22 ENCOUNTER — Encounter (INDEPENDENT_AMBULATORY_CARE_PROVIDER_SITE_OTHER): Payer: Self-pay | Admitting: Internal Medicine

## 2008-04-22 IMAGING — CR DG CHEST 1V
1 series · 1 of 1 positions shown · non-contrast
Comparison: [DATE]

CLINICAL DATA: Possible right pneumonia

CHEST - 1 VIEW

[view not recorded]
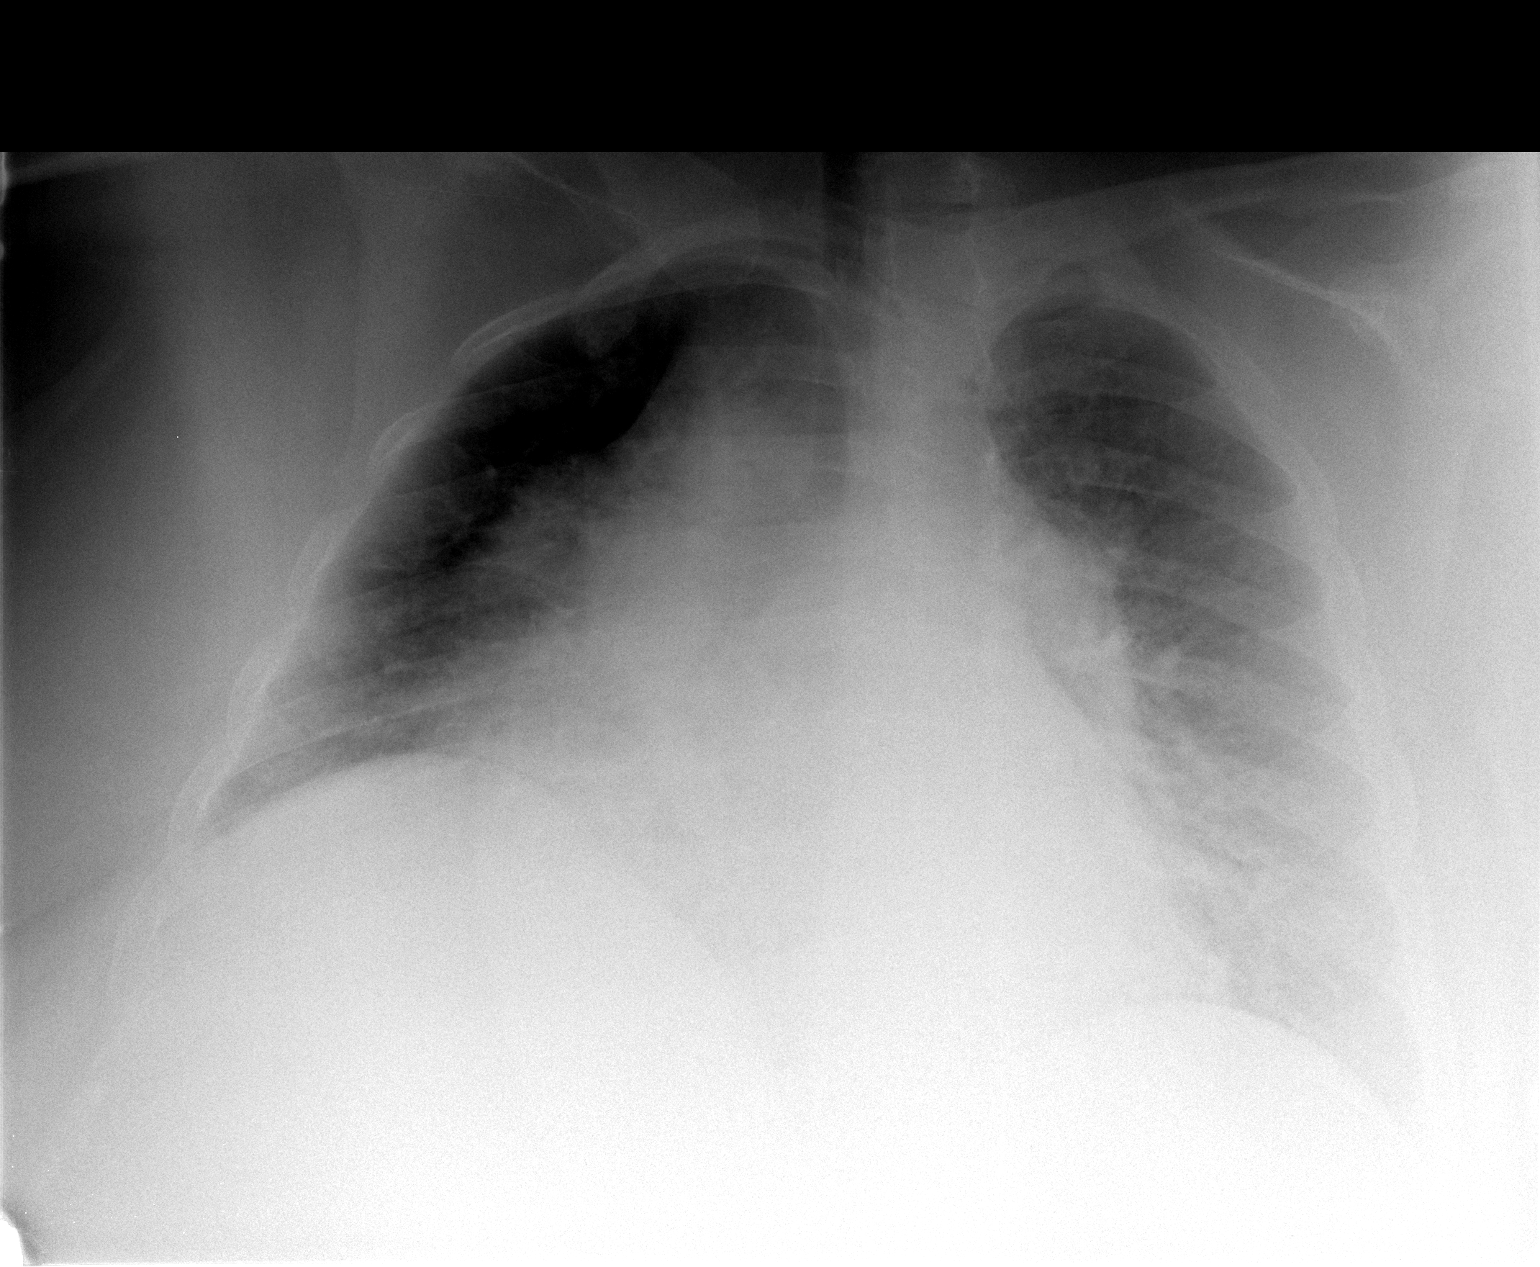

[1 of 1 positions shown; findings below may reference images not displayed]

FINDINGS: The study is markedly limited by patient body habitus.
Cardiomegaly and central vascular congestion again noted.  There is
improvement aeration of the right lung without convincing
infiltrate.  Probable mild interstitial edema bilaterally.  The
bilateral basilar atelectasis.
IMPRESSION: Probable mild congestion/edema bilaterally.  Improvement in
aeration of the right lung without convincing infiltrate.
Cardiomegaly and bilateral basilar atelectasis again noted.

## 2008-05-13 ENCOUNTER — Inpatient Hospital Stay (HOSPITAL_COMMUNITY): Admission: EM | Admit: 2008-05-13 | Discharge: 2008-05-24 | Payer: Self-pay | Admitting: Emergency Medicine

## 2008-05-13 ENCOUNTER — Ambulatory Visit: Payer: Self-pay | Admitting: Pulmonary Disease

## 2008-05-13 IMAGING — CR DG CHEST 1V PORT
1 series · 1 of 1 positions shown · non-contrast
Comparison: [DATE].

CLINICAL DATA: Hypoglycemia.  Morbid obesity.

PORTABLE CHEST - 1 VIEW

[view not recorded]
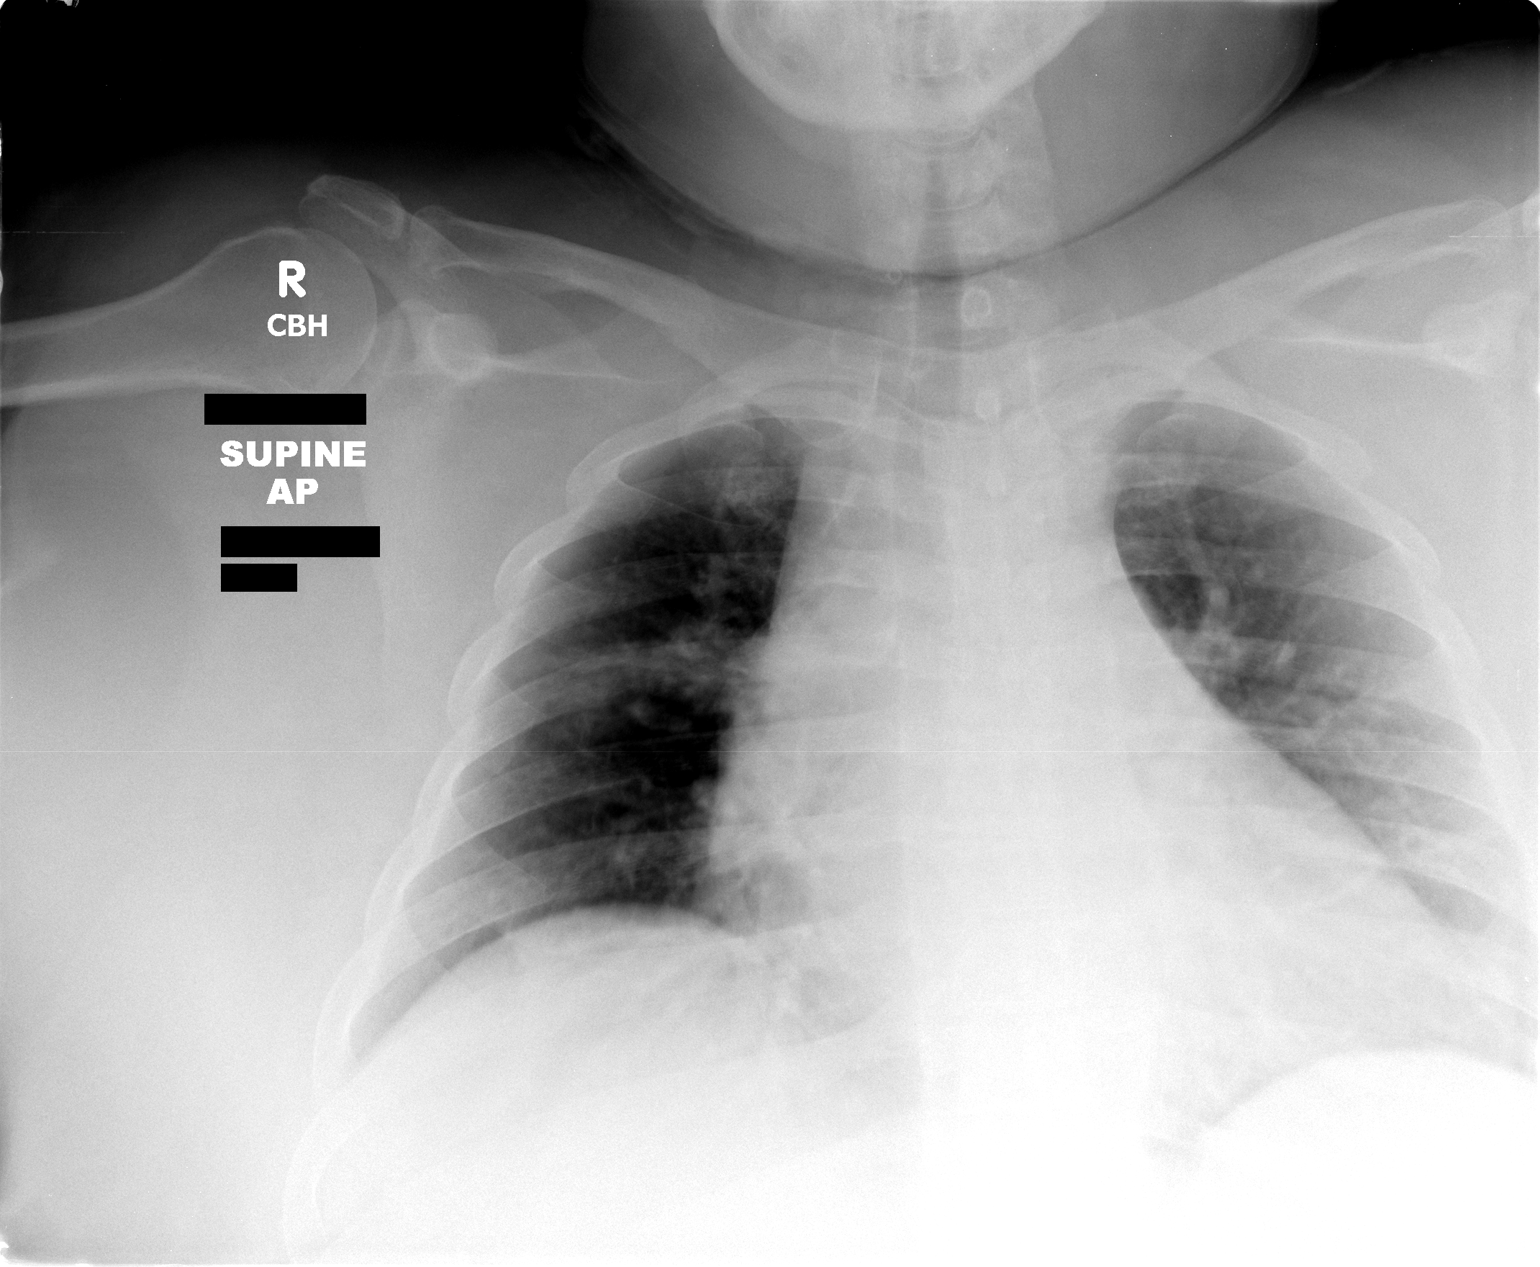

[1 of 1 positions shown; findings below may reference images not displayed]

FINDINGS: The lateral left lung base is not currently included.
The remainder of the lungs are clear.  Stable enlarged cardiac
silhouette.  Unremarkable bones.
IMPRESSION: Stable cardiomegaly.  No acute abnormality seen.

## 2008-05-14 IMAGING — CR DG CHEST 1V PORT
1 series · 1 of 1 positions shown · non-contrast
Comparison: [DATE].

CLINICAL DATA: Sepsis.

[AP]
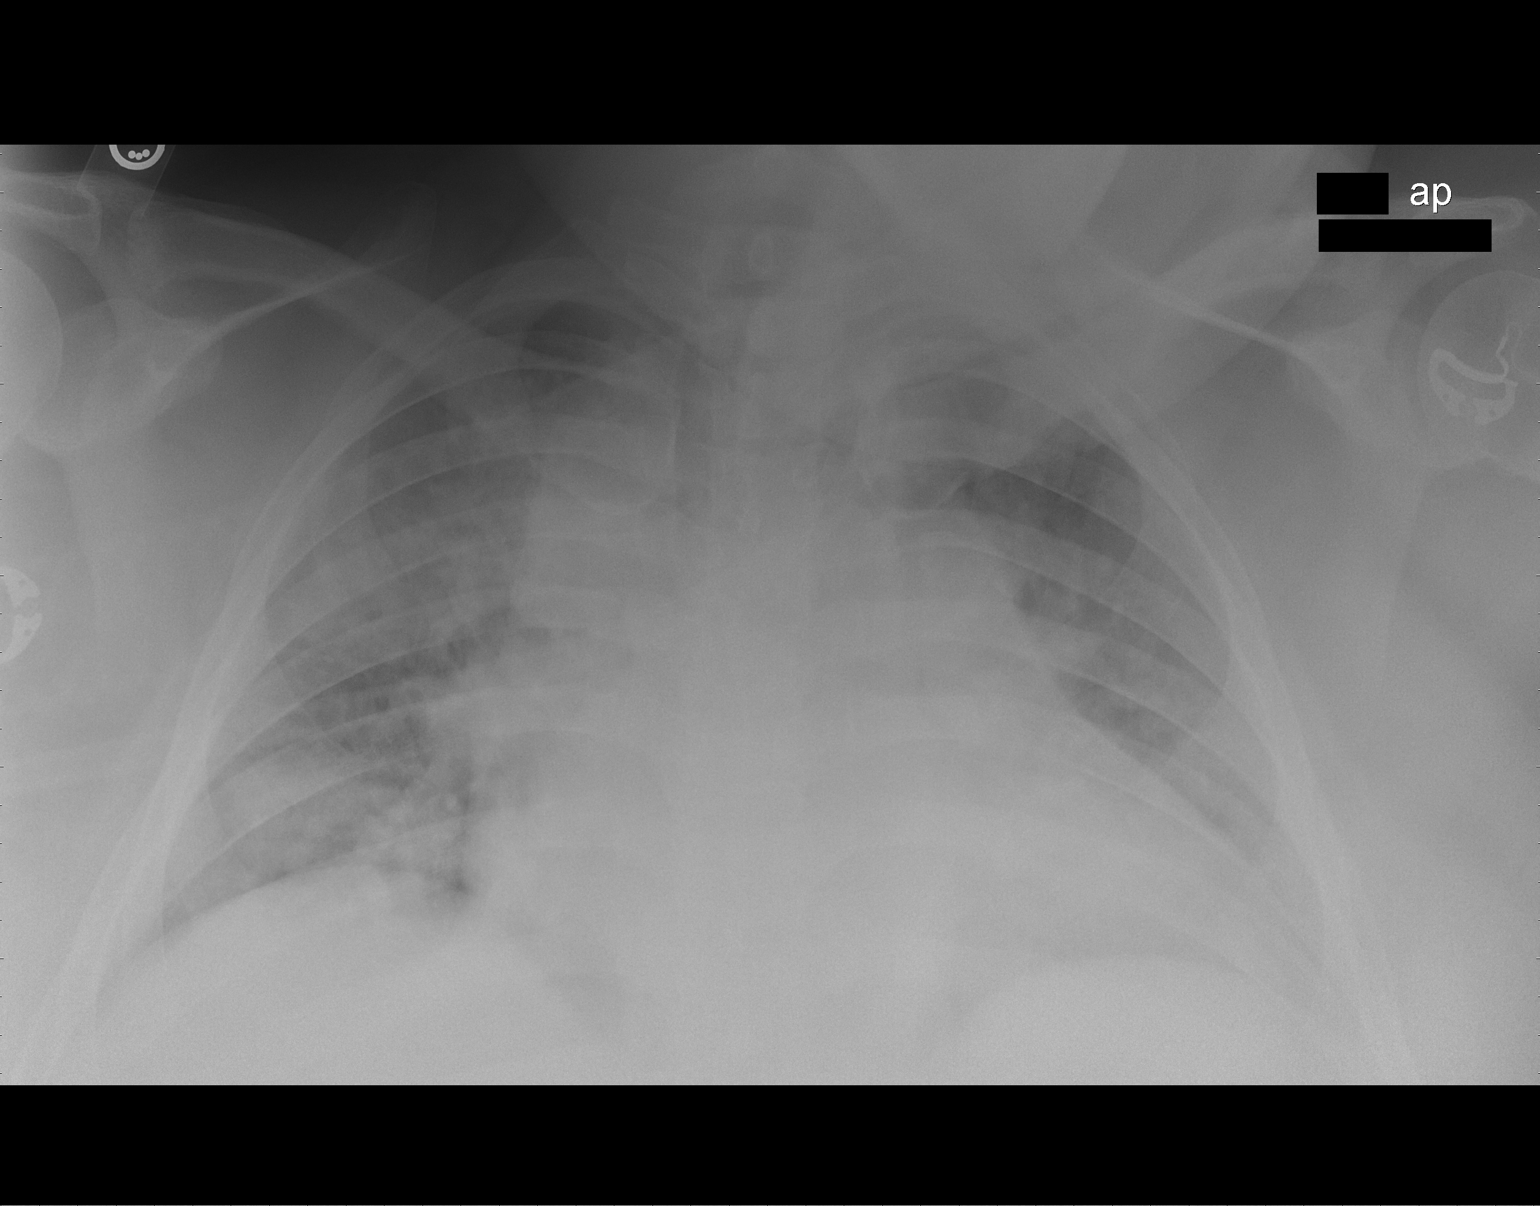

[1 of 1 positions shown; findings below may reference images not displayed]

FINDINGS: Cardiomegaly.  Interval development of patchy
consolidation right greater than left.  Question asymmetric
pulmonary edema versus infectious infiltrates.  No gross
pneumothorax.
IMPRESSION: Interval change with development of asymmetric air space disease.
This is patchy in degree most notable on the right.  Question
asymmetric pulmonary edema versus infectious infiltrate.

This has been made a call report.

## 2008-05-14 IMAGING — CR DG CHEST 1V PORT
1 series · 2 of 2 positions shown · non-contrast
Comparison: Earlier today.

CLINICAL DATA: PICC placement.  Morbidly obese.

PORTABLE CHEST - 1 VIEW

[Series 1: AP · 0.16mm/px · 2 of 2 slices shown]
[im 1/2]
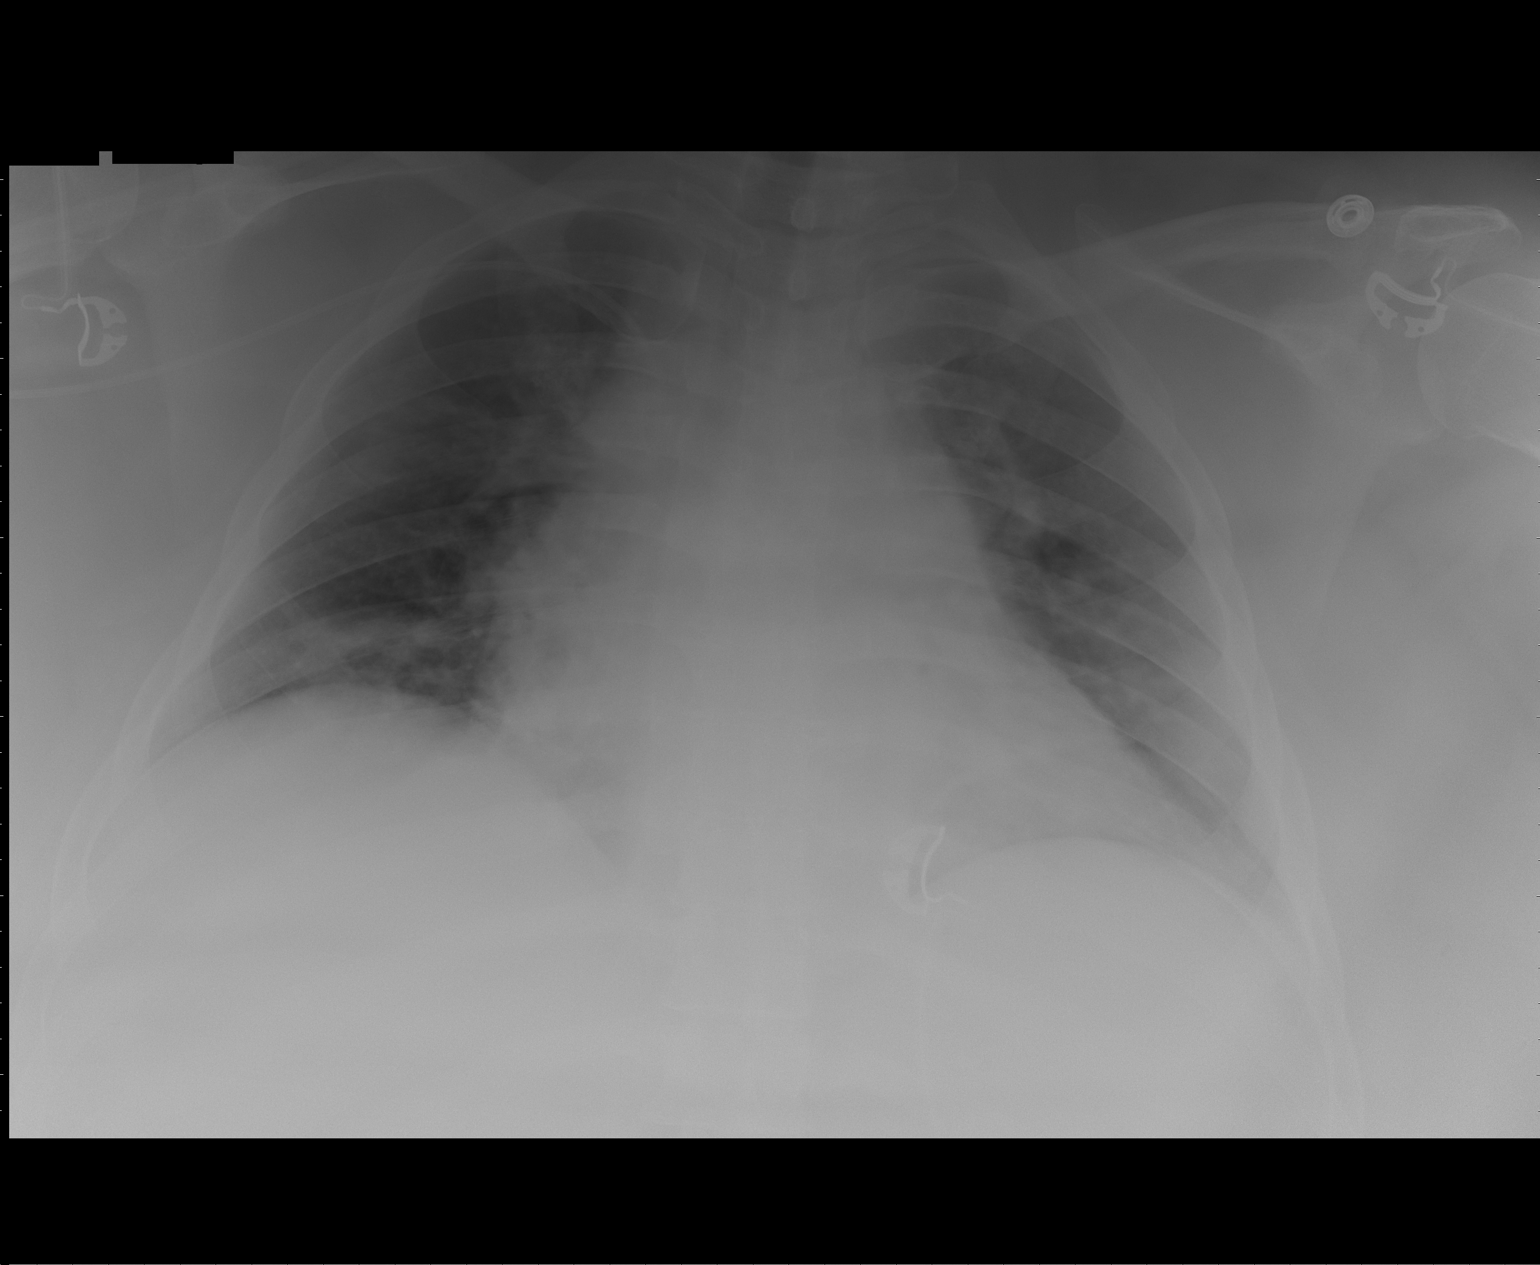
[im 2/2]
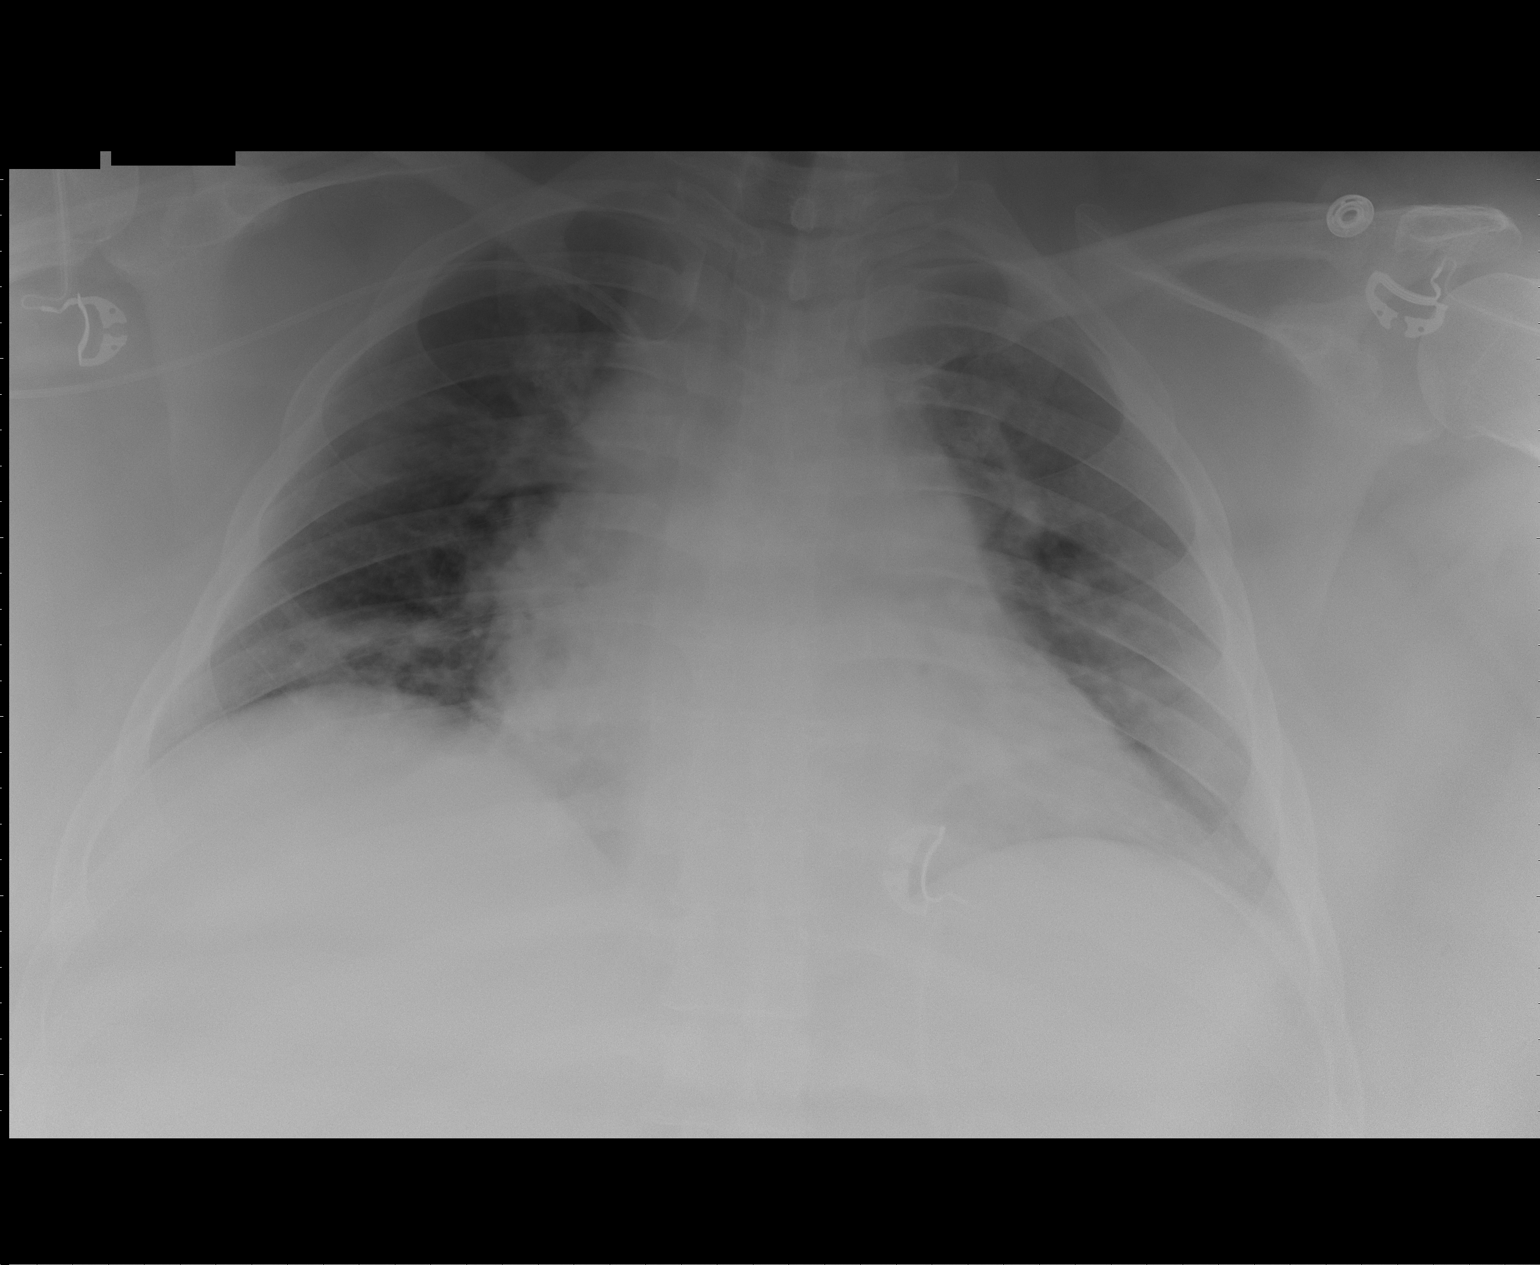

[2 of 2 positions shown; findings below may reference images not displayed]

FINDINGS: The cardiac silhouette is enlarged with an interval
decrease in size.  Significant decrease in patchy airspace opacity
bilaterally.  Interval right PICC with its tip poorly visualized in
the region of the right innominate vein.  Unremarkable bones.
IMPRESSION: 1.  Right PICC tip in the right innominate vein.  It is recommended
that this be advanced 5 cm.
2.  Cardiomegaly and alveolar edema, both improved.

## 2008-05-14 IMAGING — CR DG CHEST 1V PORT
1 series · 1 of 1 positions shown · non-contrast
Comparison: [DATE].  [DATE] a.m.

CLINICAL DATA: Sepsis.  PICC line placement.

PORTABLE CHEST - 1 VIEW

[view not recorded]
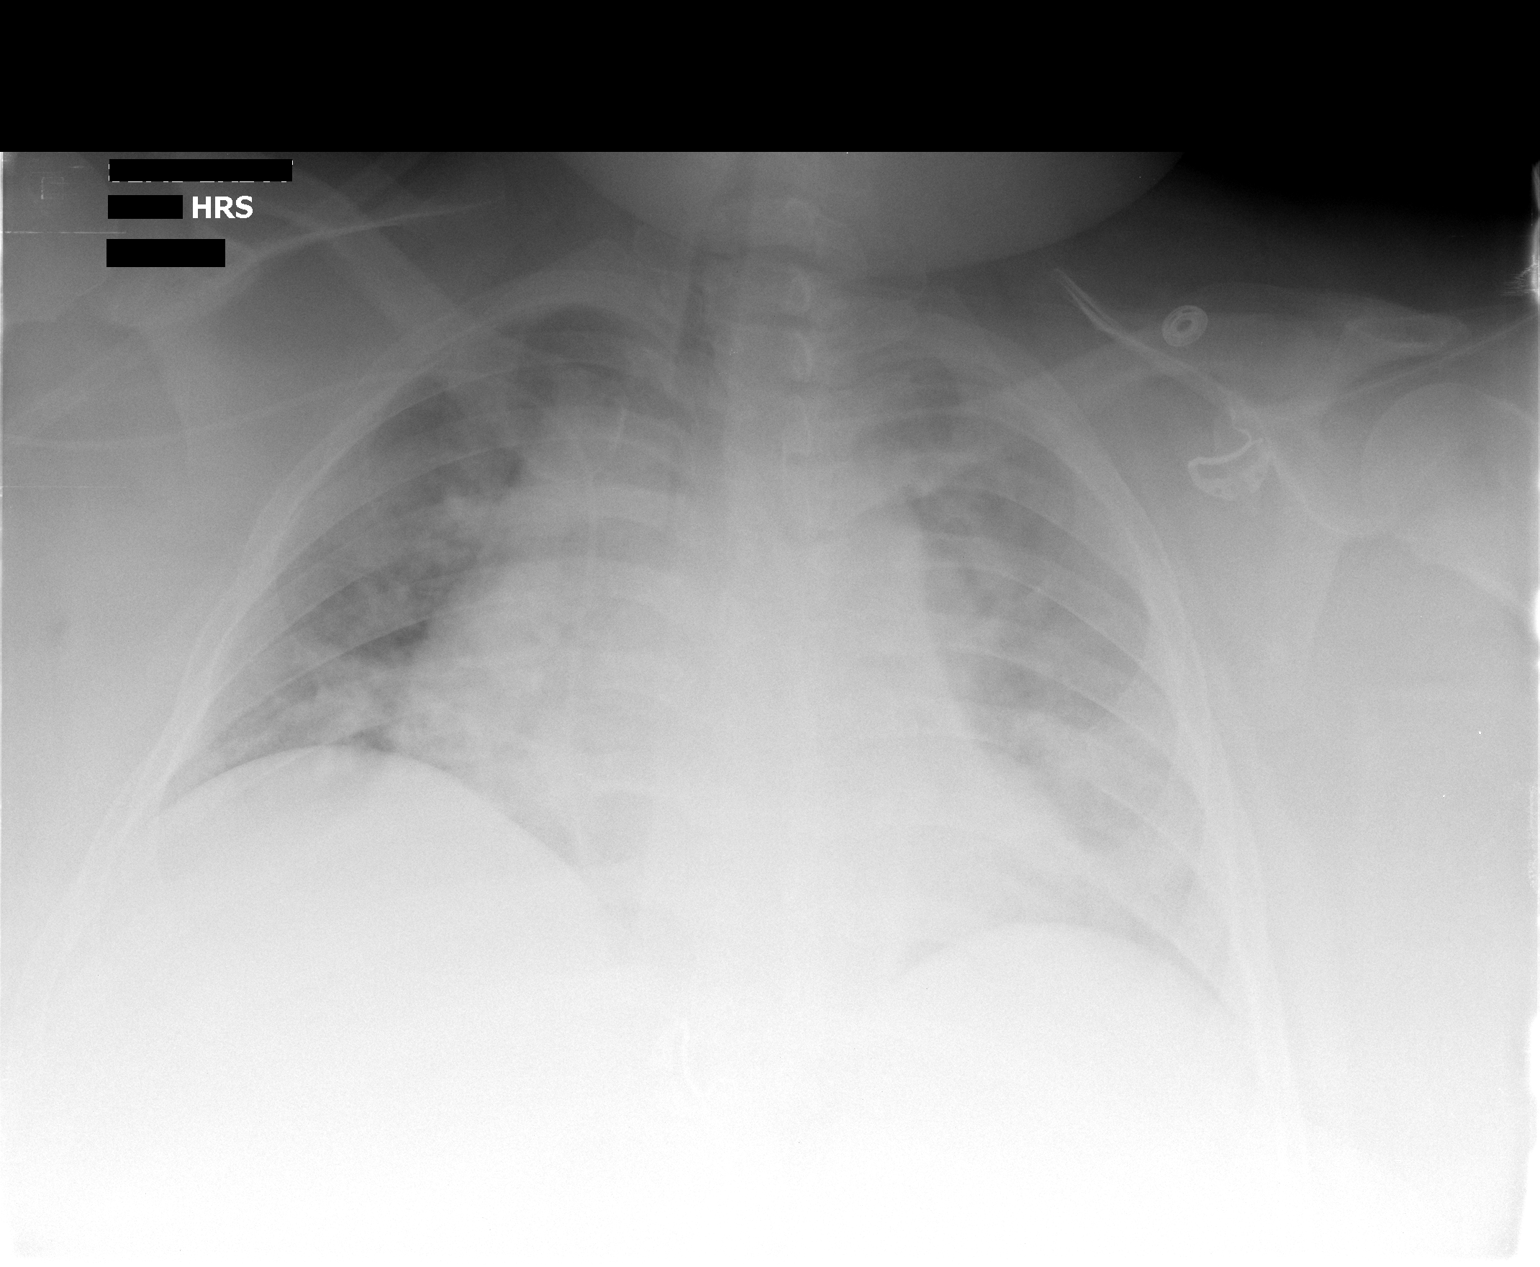

[1 of 1 positions shown; findings below may reference images not displayed]

FINDINGS: Exam limited by patient's habitus and portable
technique.  Right central line is in place with tip in region of
the right atrium.  This can be retracted by 6 cm to be in the
region of the distal superior vena cava.  No gross pneumothorax.

Cardiomegaly.  Asymmetric air space disease may represent pulmonary
edema.  Infectious infiltrate cannot be excluded.
IMPRESSION: PICC line tip right atrium.  To be within the distal superior vena
cava, this can be retracted by 6 cm.

## 2008-05-15 IMAGING — CR DG CHEST 1V PORT
1 series · 1 of 1 positions shown · non-contrast
Comparison: [DATE]

CLINICAL DATA: Sepsis, follow-up

PORTABLE CHEST - 1 VIEW

[view not recorded]
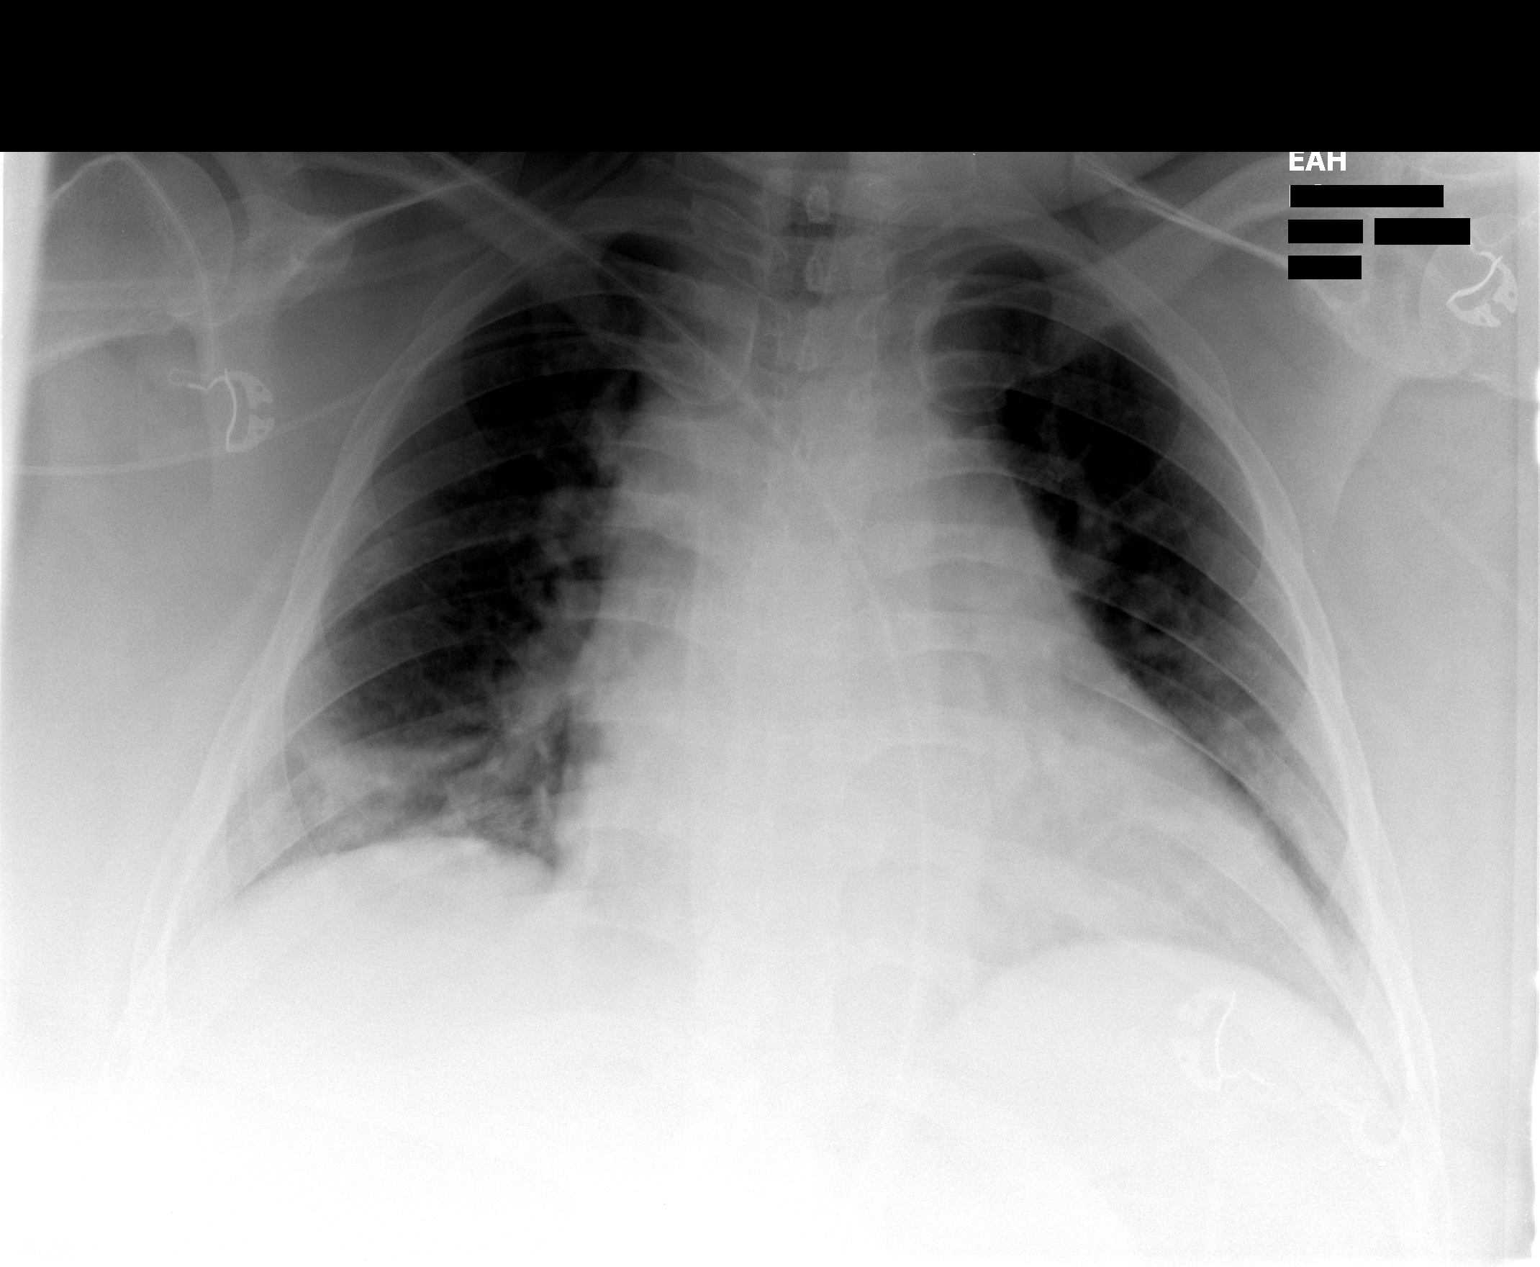

[1 of 1 positions shown; findings below may reference images not displayed]

FINDINGS: Exam is limited because of portable technique and patient
size.  Cardiac silhouette remains enlarged with vascular
congestion.  Right PICC line tip appears to be in the SVC.
Improving airspace disease versus edema with residual right base
opacity.  No large effusion or pneumothorax.  Midline trachea.
IMPRESSION: Improving airspace disease versus edema.
Residual right lower lobe atelectasis or infiltrate

## 2008-05-16 IMAGING — CR DG CHEST 1V PORT
1 series · 1 of 1 positions shown · non-contrast
Comparison: [DATE].

CLINICAL DATA: Sepsis.

PORTABLE CHEST - 1 VIEW

[view not recorded]
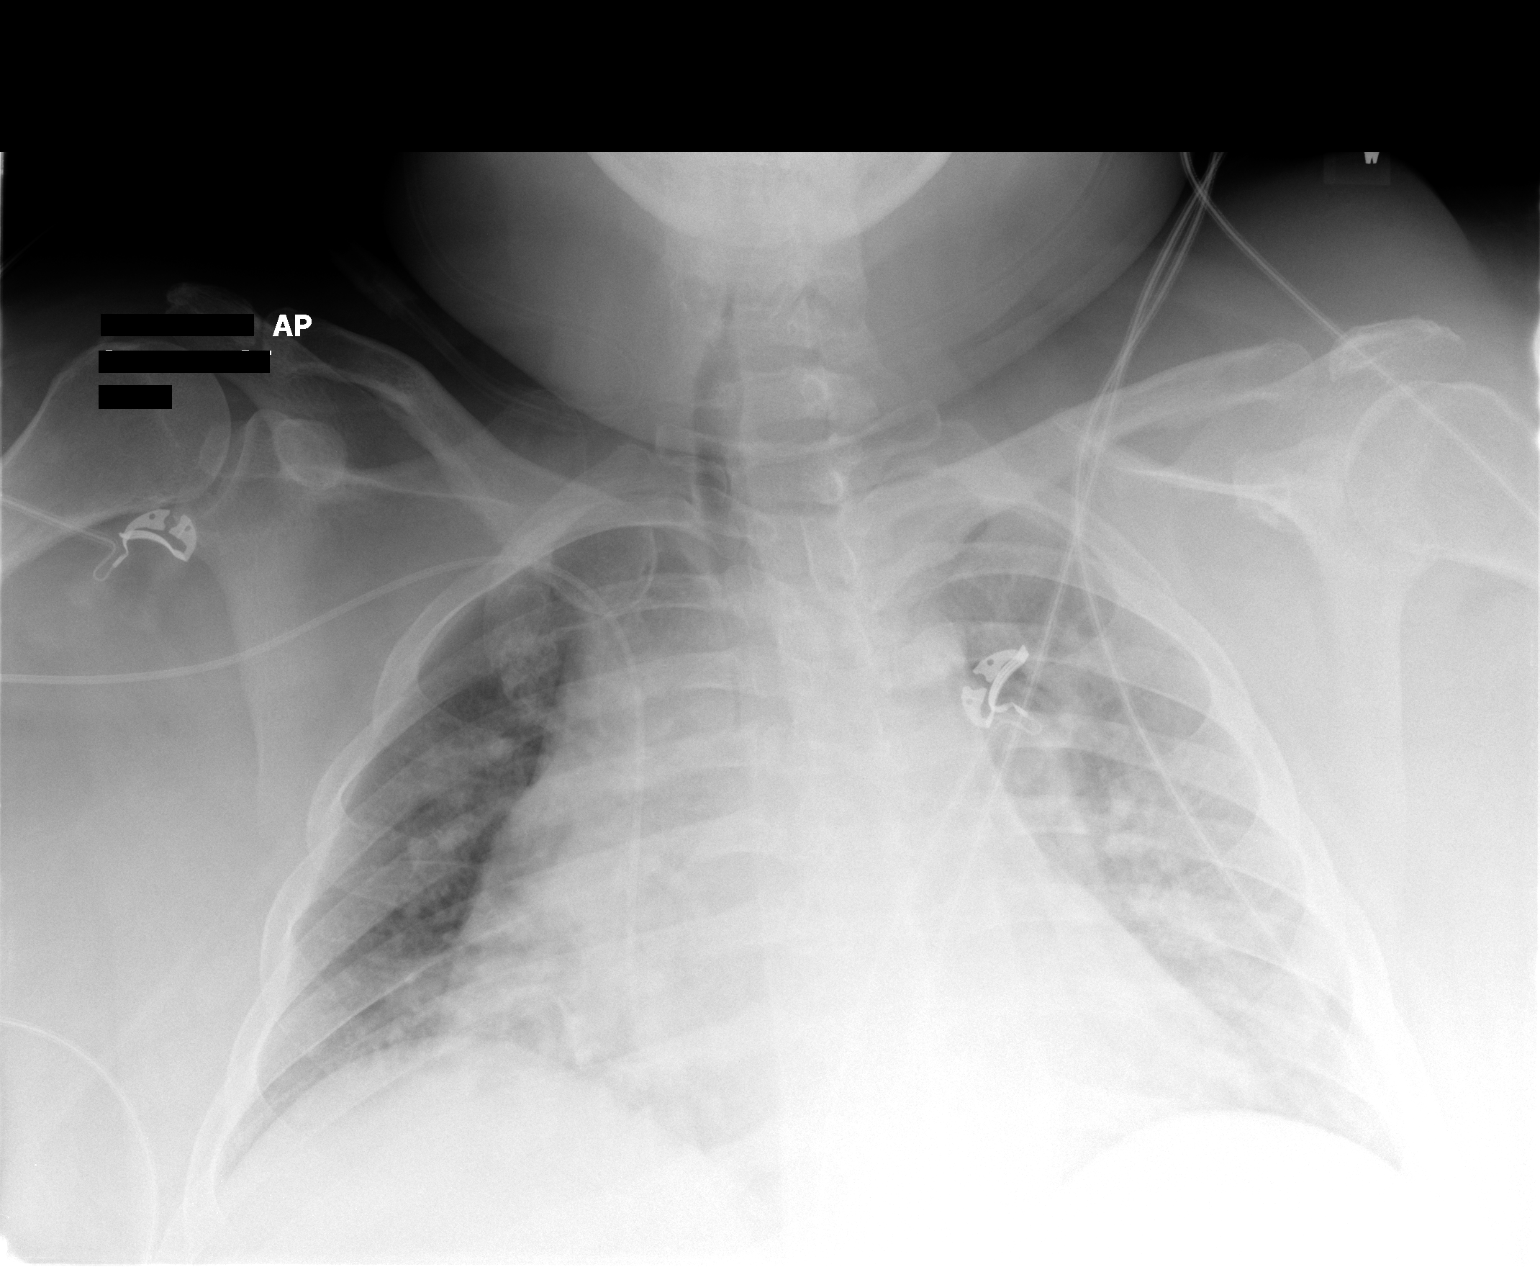

[1 of 1 positions shown; findings below may reference images not displayed]

FINDINGS: Right PICC line tip appears been region of the right
atrium.  This would need to be retracted by 6 cm to be within the
distal superior vena cava.  Cardiomegaly.  Central pulmonary
vascular congestion/mild pulmonary edema.  Patchy opacity right
infrahilar region / right lung base unchanged.  No pneumothorax.
IMPRESSION: PICC line tip right atrium.

Cardiomegaly and pulmonary vascular congestion/mild pulmonary
edema.

Persistent patchy opacity right infrahilar / right base region.

## 2008-05-20 IMAGING — CT CT PELVIS W/O CM
2 of 4 series · 15 of 46 positions shown, 17 images · non-contrast
Comparison: [DATE].

CT ABDOMEN

CLINICAL DATA: 37-year-old female with nausea and vomiting.
Sepsis.

CT ABDOMEN AND PELVIS WITHOUT CONTRAST
TECHNIQUE: Multidetector CT imaging of the abdomen and pelvis was
performed following the standard protocol without intravenous
contrast.

[Series 2: abd/pelv w/o 5.0 b31f st · axial · non-contrast · 0.96mm/px · z∈[-548,-84]mm · 12 of 103 slices shown, 14 images]
[im 5/103  soft-tissue]
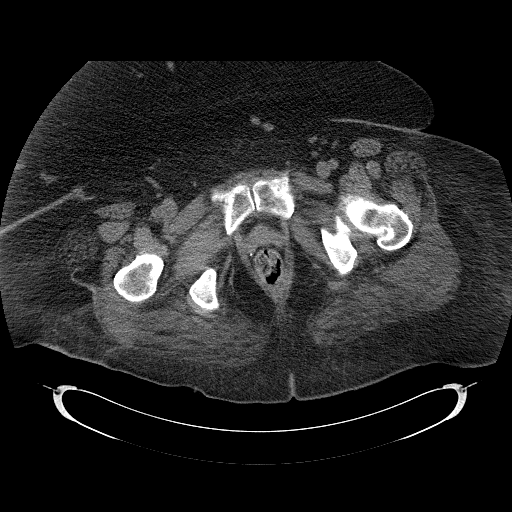
[im 5/103  bone]
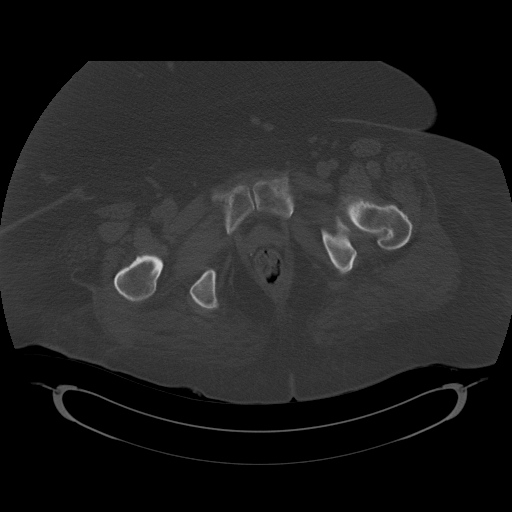
[im 14/103  soft-tissue]
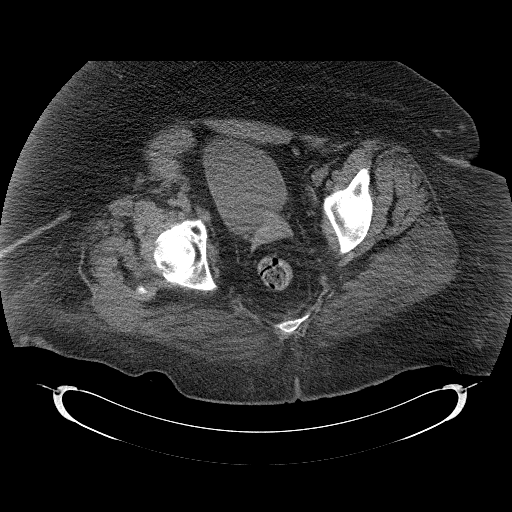
[im 23/103  soft-tissue]
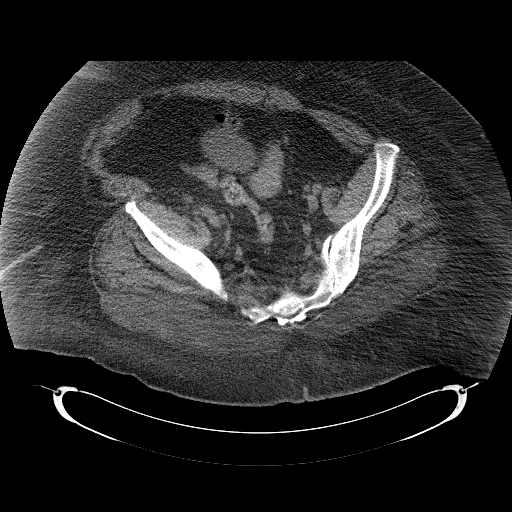
[im 32/103  soft-tissue]
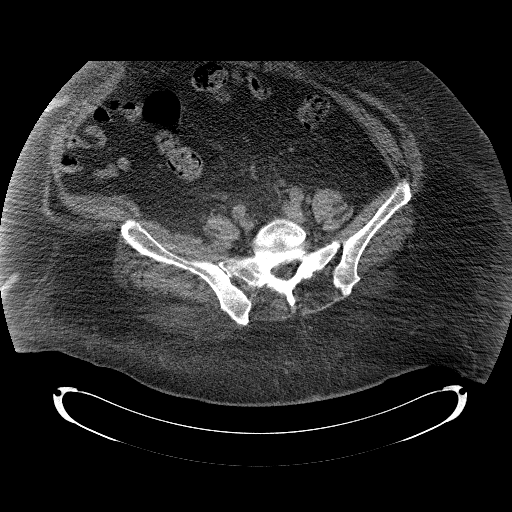
[im 40/103  soft-tissue]
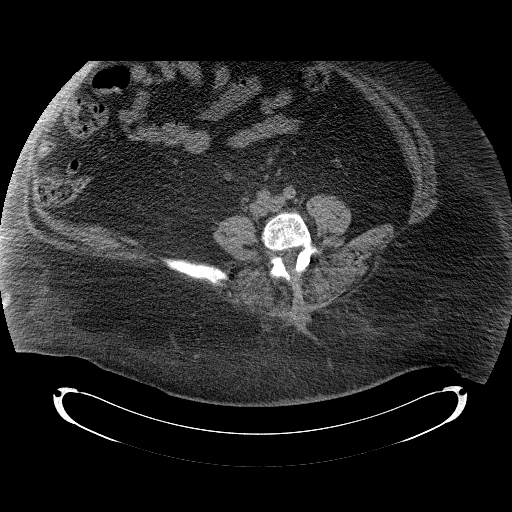
[im 49/103  soft-tissue]
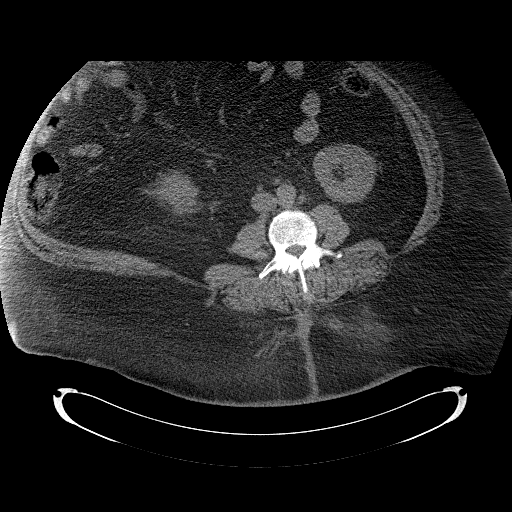
[im 54/103  soft-tissue]
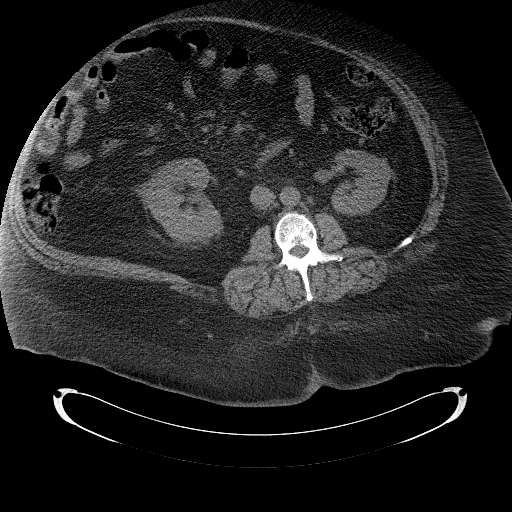
[im 63/103  soft-tissue]
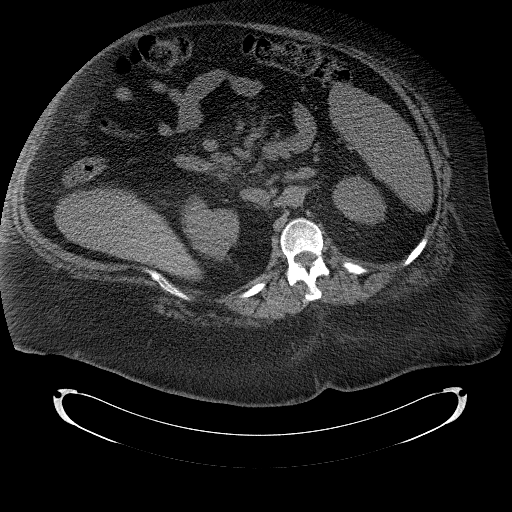
[im 71/103  soft-tissue]
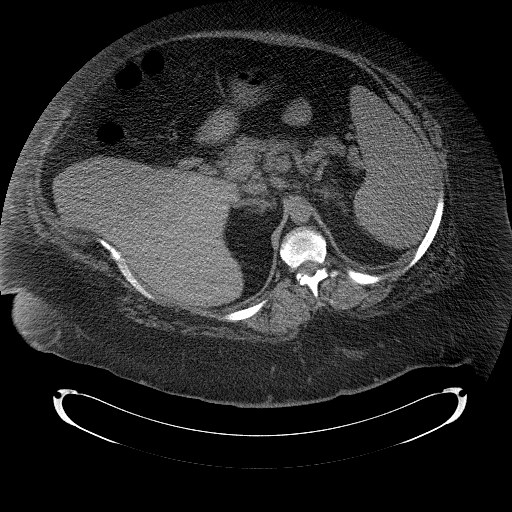
[im 71/103  bone]
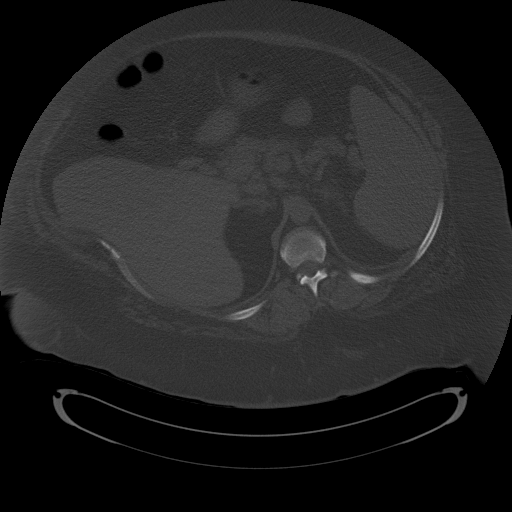
[im 80/103  soft-tissue]
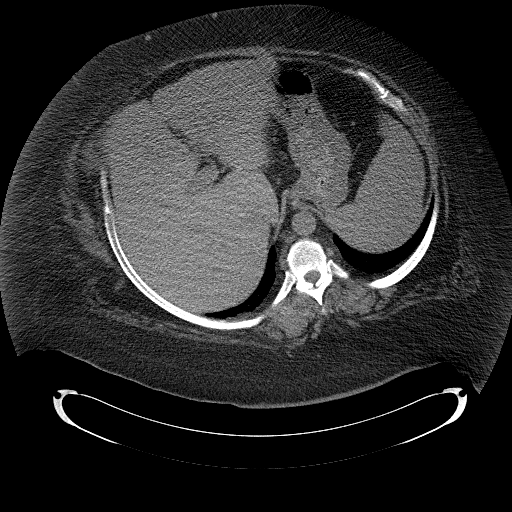
[im 89/103  soft-tissue]
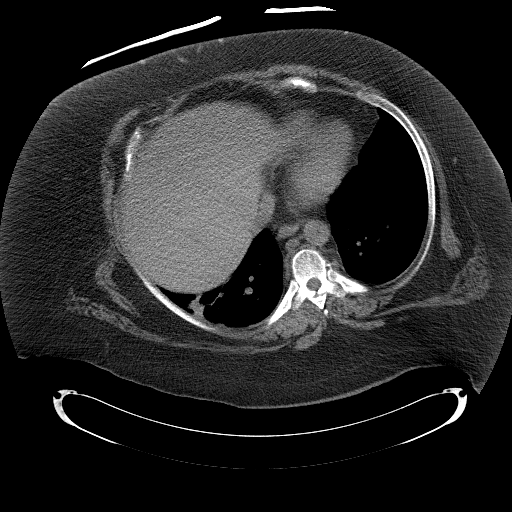
[im 98/103  soft-tissue]
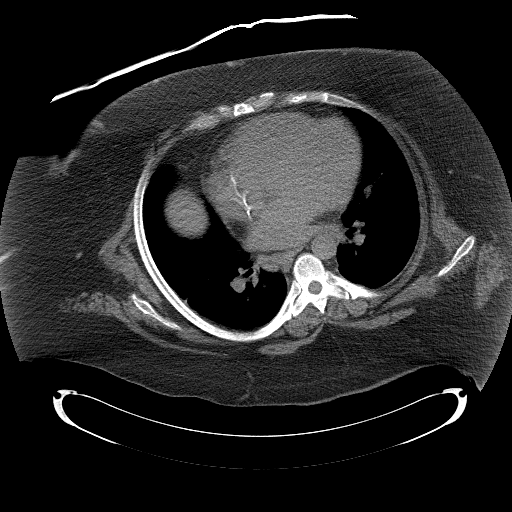

[Series 5: abd/pelv w/o 3.0 spo thins · coronal · non-contrast · 1.00mm/px · 3 of 106 slices shown]
[im 36/106  soft-tissue]
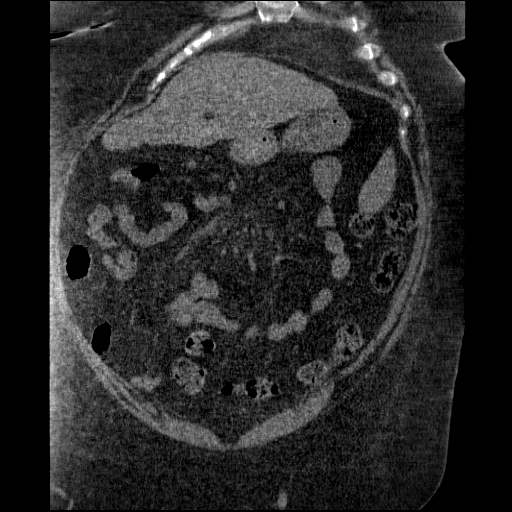
[im 47/106  soft-tissue]
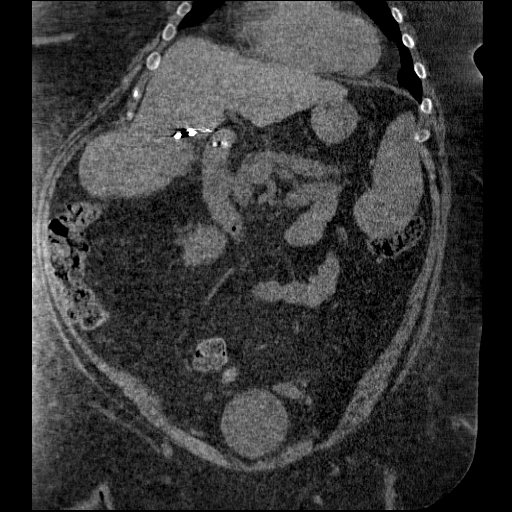
[im 59/106  soft-tissue]
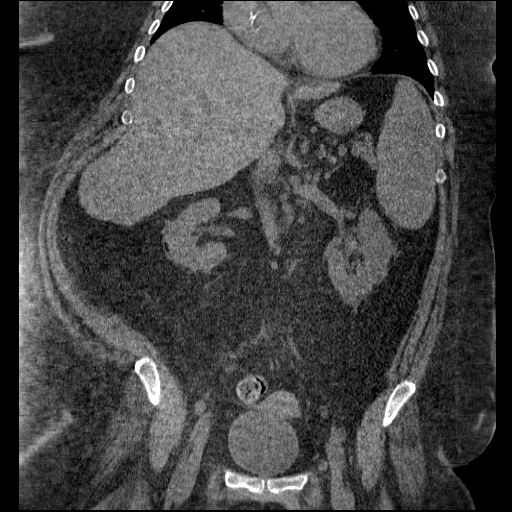

[15 of 46 positions shown; findings below may reference images not displayed]

FINDINGS: Large body habitus.  Plate-like probable atelectasis in
the right lower lobe, most confluent in the lateral basal segment
on series 3 image 15 where superimposed infection is not excluded.
No pleural effusion.  Cardiomegaly appears not significantly
changed.  Advanced degenerative changes lumbar spine including
chronic L5-S1 pars defects, but without associated
spondylolisthesis. No acute osseous abnormality identified.
Redundant colon appears otherwise within normal limits in the
abdomen.  Gallbladder is surgically absent.  Hepatosplenomegaly is
not significantly changed.  Chronic retroperitoneal lymphadenopathy
appears mildly increased since the previous exam, and there is
indistinct inflammatory stranding involving the retroperitoneum at
the celiac axis, and continuing to the aorto iliac bifurcation.
The largest nodes measure 16 mm in short axis (previously 13 mm at
comparable level).  The adrenal glands remain within normal limits.
There is mild perinephric stranding on the right which appears
increased since the previous exam and most apparent at the mid pole
level (series 2 image 48).  No hydronephrosis, hydroureter or
nephrolithiasis on either side.  Mild left perinephric stranding
also appears mildly increased.  The ureters have a somewhat medial
course to the pelvis, but are otherwise within normal limits.
Noncontrasted stomach, duodenum are within normal limits.
Lymphadenopathy at the porta hepatis is continuous with that
described at the celiac axis.  The pancreas is grossly normal in
the absence of contrast.  Porta hepatis nodes measure 15 mm in
short axis, previously 12 mm at comparable level.  No bowel
obstruction.  Visualized small bowel loops within normal limits.
Fat-containing umbilical hernia.  Due to large body habitus, the
entire abdomen is not identified.  No free fluid.
IMPRESSION: 1.  Right lower lobe atelectasis, superimposed infection not
excluded in the lateral basal segment.
2.  Porta hepatis and root of the mesentery lymphadenopathy is
chronic but mildly increased since [XA].  Indistinct
retroperitoneal fat stranding extends to the level to the
aortoiliac bifurcation and is of unknown etiology and significance.
This might be associated with the findings in #3, see next.
3.  Right greater than left perinephric stranding without acute
obstructive uropathy suspicious for pyelonephritis. Clinical
correlation recommended.
4.  Chronic hepatosplenomegaly.  If there is a history of chronic
hepatitis, this might explain the porta hepatis lymphadenopathy,
but not the retroperitoneal fat stranding.

CT PELVIS
FINDINGS: Redundant sigmoid colon.  Otherwise visualized distal
small and large bowel within normal limits.  No free fluid.
Indistinct inflammatory changes at the aorto iliac bifurcation as
discussed in the abdomen section above.  Bladder and distal ureters
are within normal limits.  Years adnexa are within normal limits.
No acute osseous abnormality identified.  Due to large body
habitus, superficial soft tissues are not entirely imaged.
IMPRESSION: 1.  Indistinct retroperitoneal fat stranding at the aorto iliac
bifurcation as discussed in the abdomen section #2 above.
2.  Otherwise no acute findings in the pelvis.

## 2009-03-25 DIAGNOSIS — Z8669 Personal history of other diseases of the nervous system and sense organs: Secondary | ICD-10-CM

## 2009-03-25 HISTORY — DX: Personal history of other diseases of the nervous system and sense organs: Z86.69

## 2009-06-11 ENCOUNTER — Inpatient Hospital Stay (HOSPITAL_COMMUNITY): Admission: EM | Admit: 2009-06-11 | Discharge: 2009-06-26 | Payer: Self-pay | Admitting: Emergency Medicine

## 2009-06-19 IMAGING — CR DG CHEST 1V PORT
1 series · 1 of 1 positions shown · non-contrast
Comparison: [DATE]

CLINICAL DATA: Fall, back pain, shortness of breath.

PORTABLE CHEST - 1 VIEW

[AP]
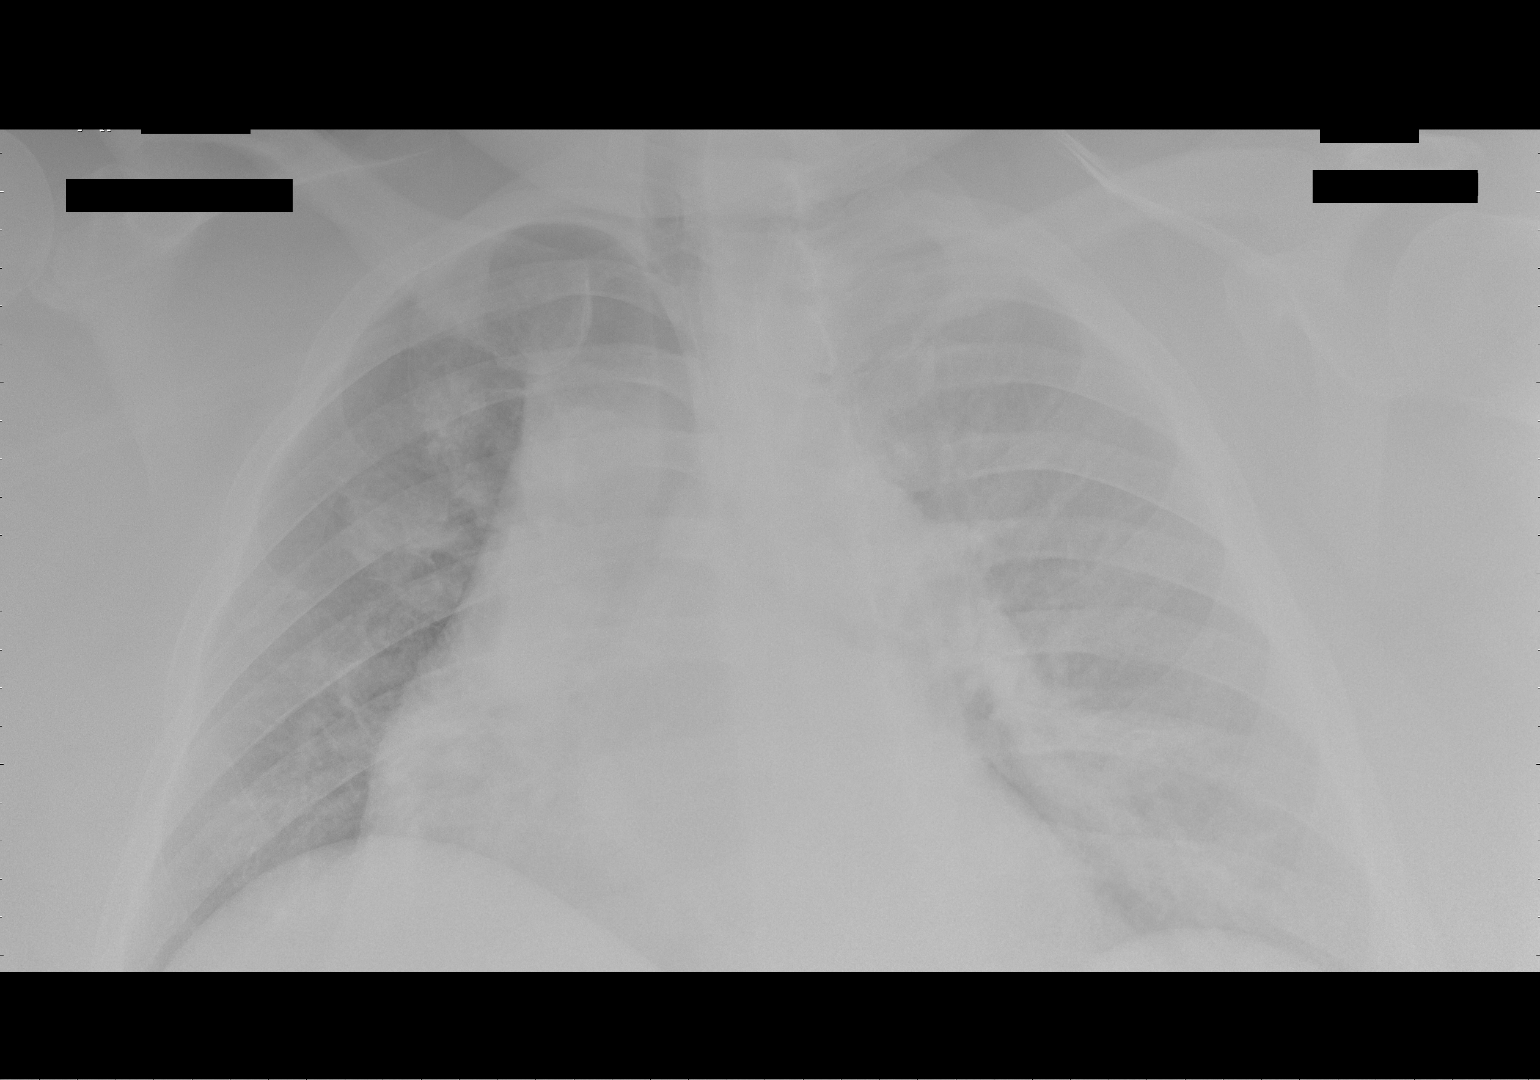

[1 of 1 positions shown; findings below may reference images not displayed]

FINDINGS: There are low lung volumes with cardiomegaly and vascular
congestion.  No overt edema.  No focal opacities or effusions.  No
acute bony abnormality or pneumothorax.
IMPRESSION: Low lung volumes.  Cardiomegaly, vascular congestion.

## 2009-06-21 IMAGING — CR DG CHEST 1V PORT
1 series · 1 of 1 positions shown · non-contrast
Comparison: [DATE].

CLINICAL DATA: Chest pain, short of breath, cough.  Fall.

PORTABLE CHEST - 1 VIEW

[AP]
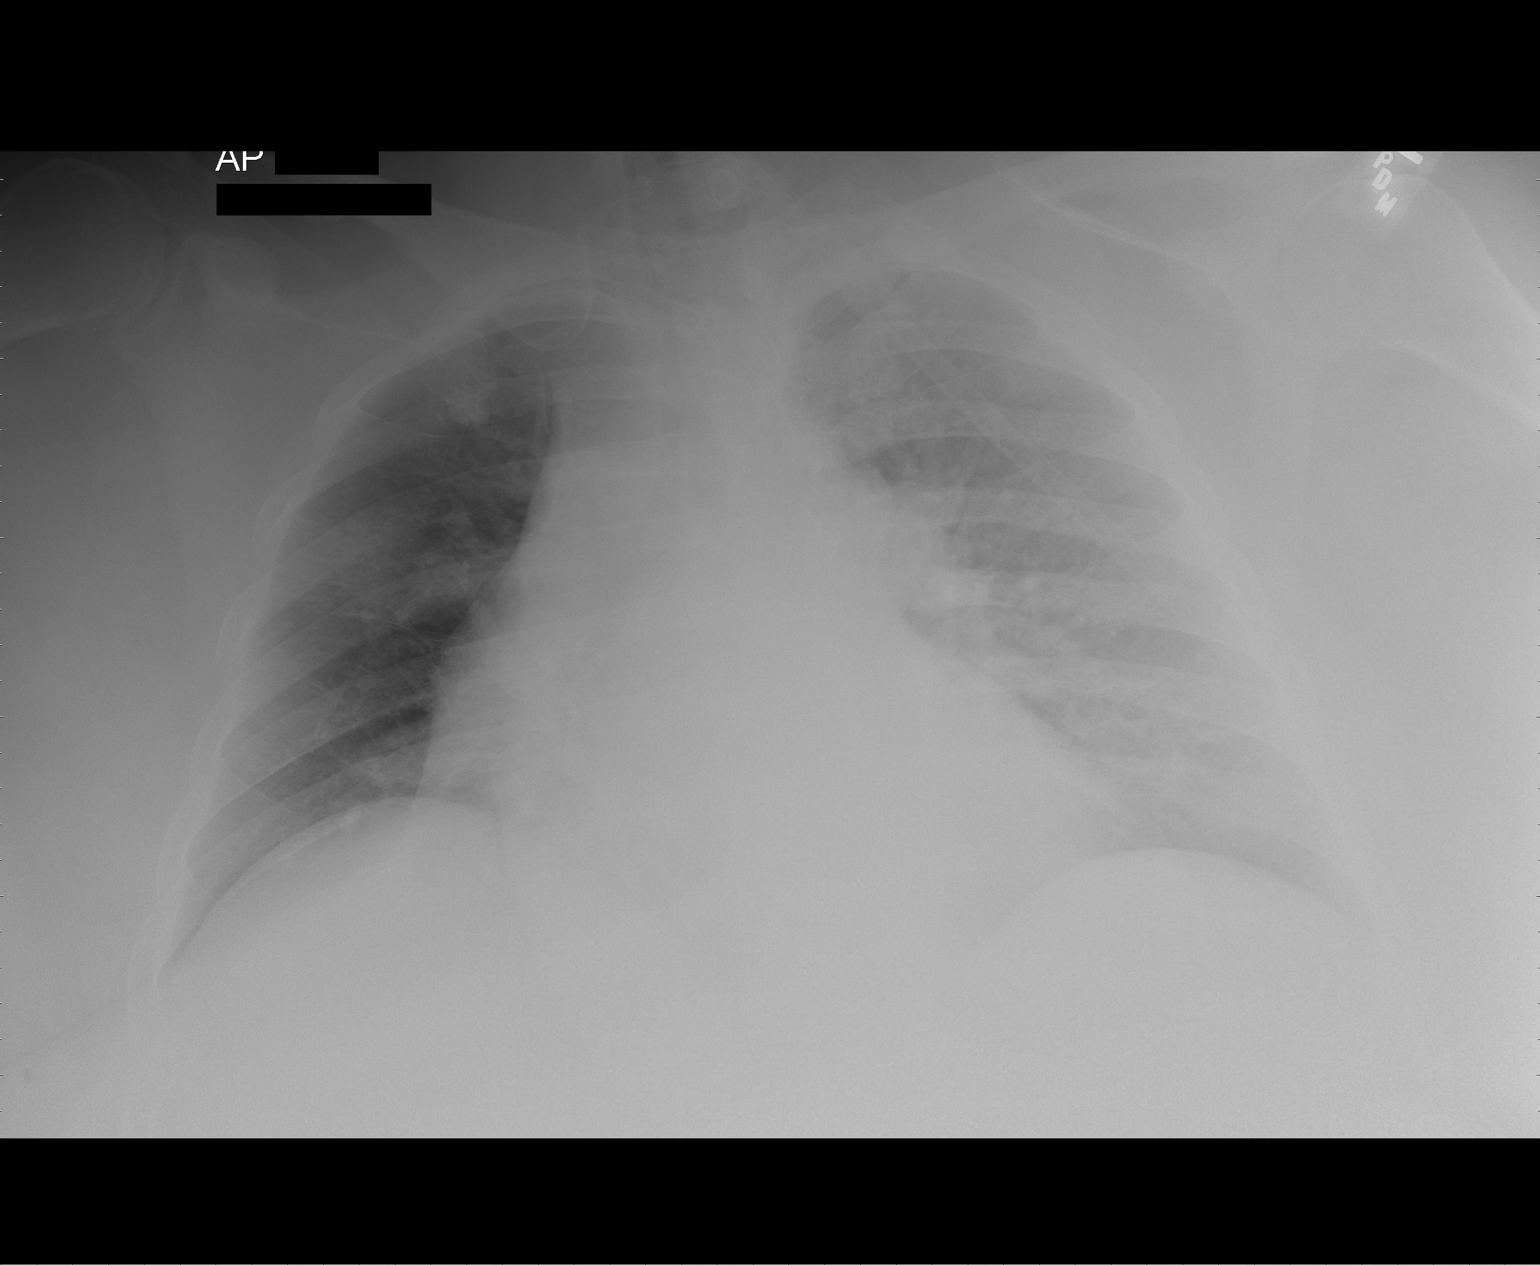

[1 of 1 positions shown; findings below may reference images not displayed]

FINDINGS: Limited inspiration with decreased lung volumes and
bibasilar atelectasis.  There is vascular congestion and
cardiomegaly which are unchanged.
IMPRESSION: Hypoventilation and vascular congestion.  No significant change
from the prior study.

## 2009-06-23 ENCOUNTER — Ambulatory Visit: Payer: Self-pay | Admitting: Pulmonary Disease

## 2009-06-23 IMAGING — CR DG CHEST 1V PORT
1 series · 1 of 1 positions shown · non-contrast
Comparison: [DATE]

CLINICAL DATA: Cough.  Wheezing.  Question pneumonia.

PORTABLE CHEST - 1 VIEW

[view not recorded]
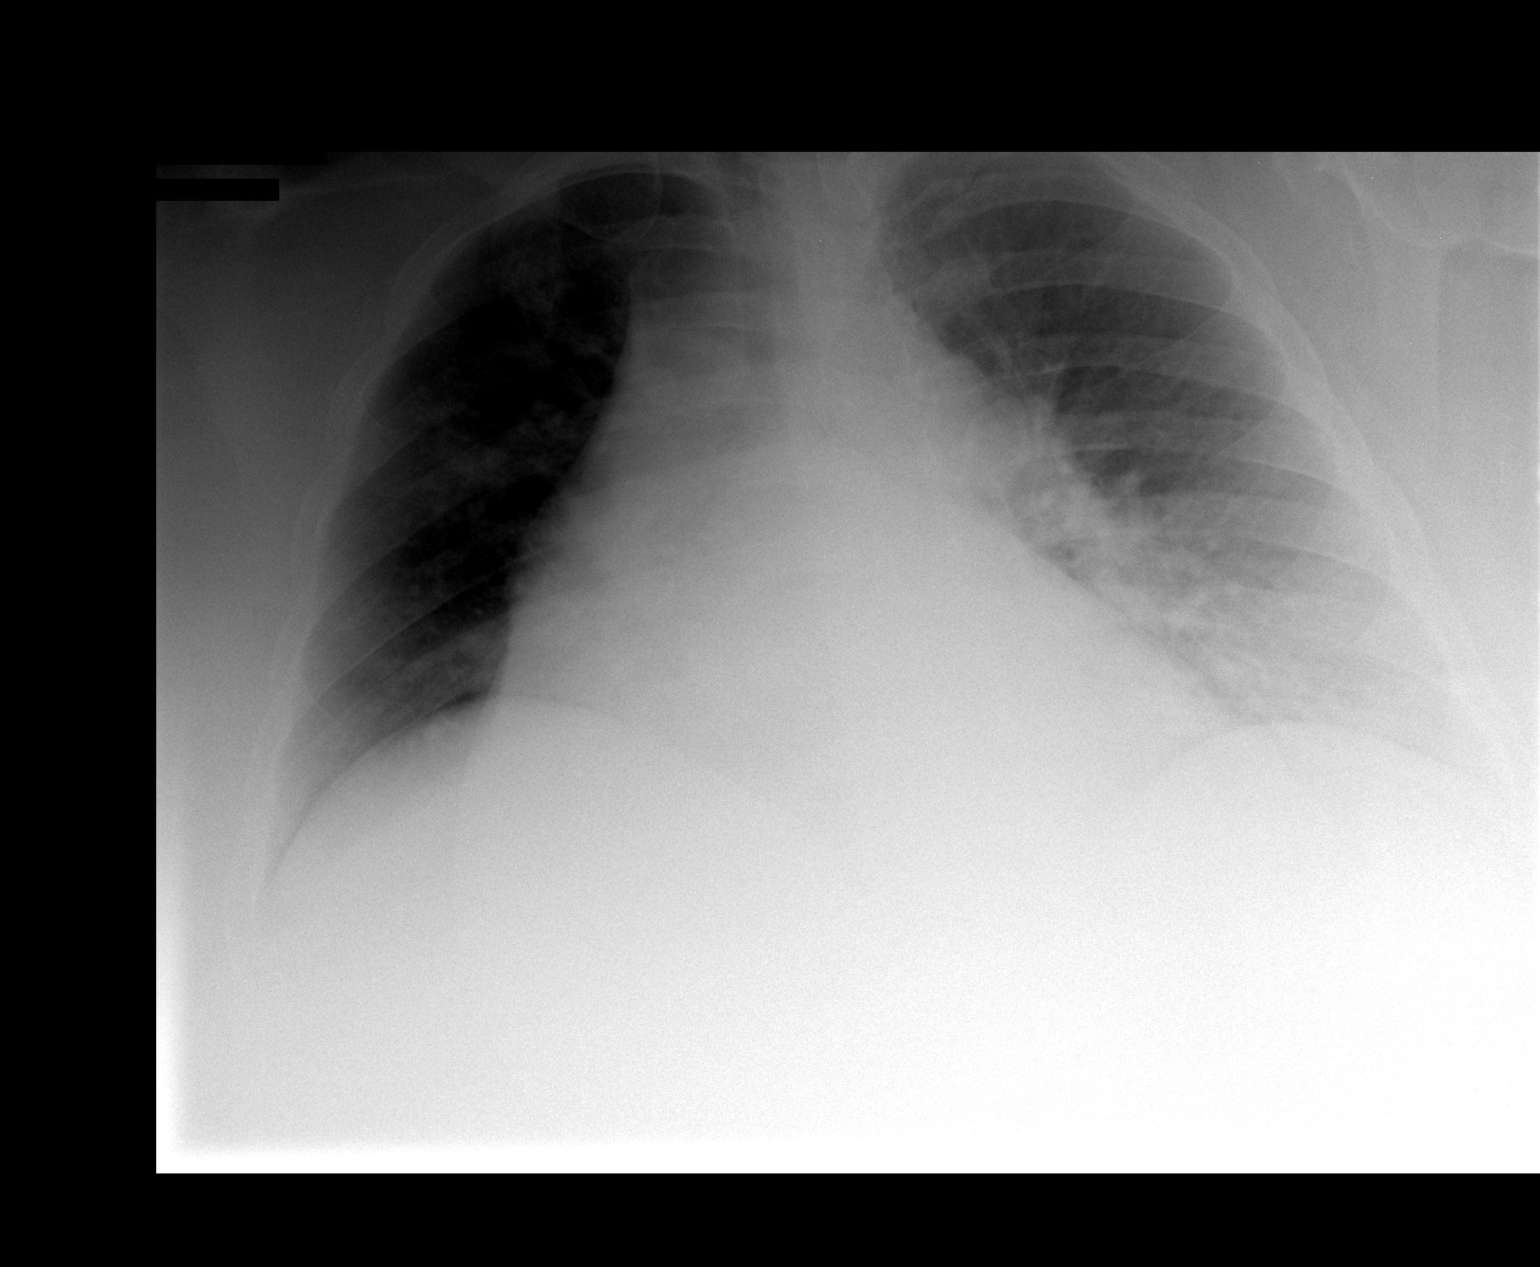

[1 of 1 positions shown; findings below may reference images not displayed]

FINDINGS: Mildly degraded exam due to AP portable technique and
patient body habitus.

Moderate cardiomegaly, accentuated by low lung volumes. No pleural
effusion or pneumothorax.  Pulmonary venous congestion is again
suspected.  There is asymmetric opacity, greater left than right.
This is similar to on the prior exam.  Favored to represent
asymmetric venous congestion.
IMPRESSION: 1.  Cardiomegaly with low lung volumes and pulmonary venous
congestion.  Similar to on the prior exam.
2.  The venous congestion is asymmetric.  Left lower lobe airspace
disease cannot be entirely excluded.  Consider short-term follow-
up.

## 2009-06-24 IMAGING — CR DG CHEST 1V PORT
1 series · 1 of 1 positions shown · non-contrast
Comparison: [DATE]

CLINICAL DATA: Chest pain

PORTABLE CHEST - 1 VIEW

[view not recorded]
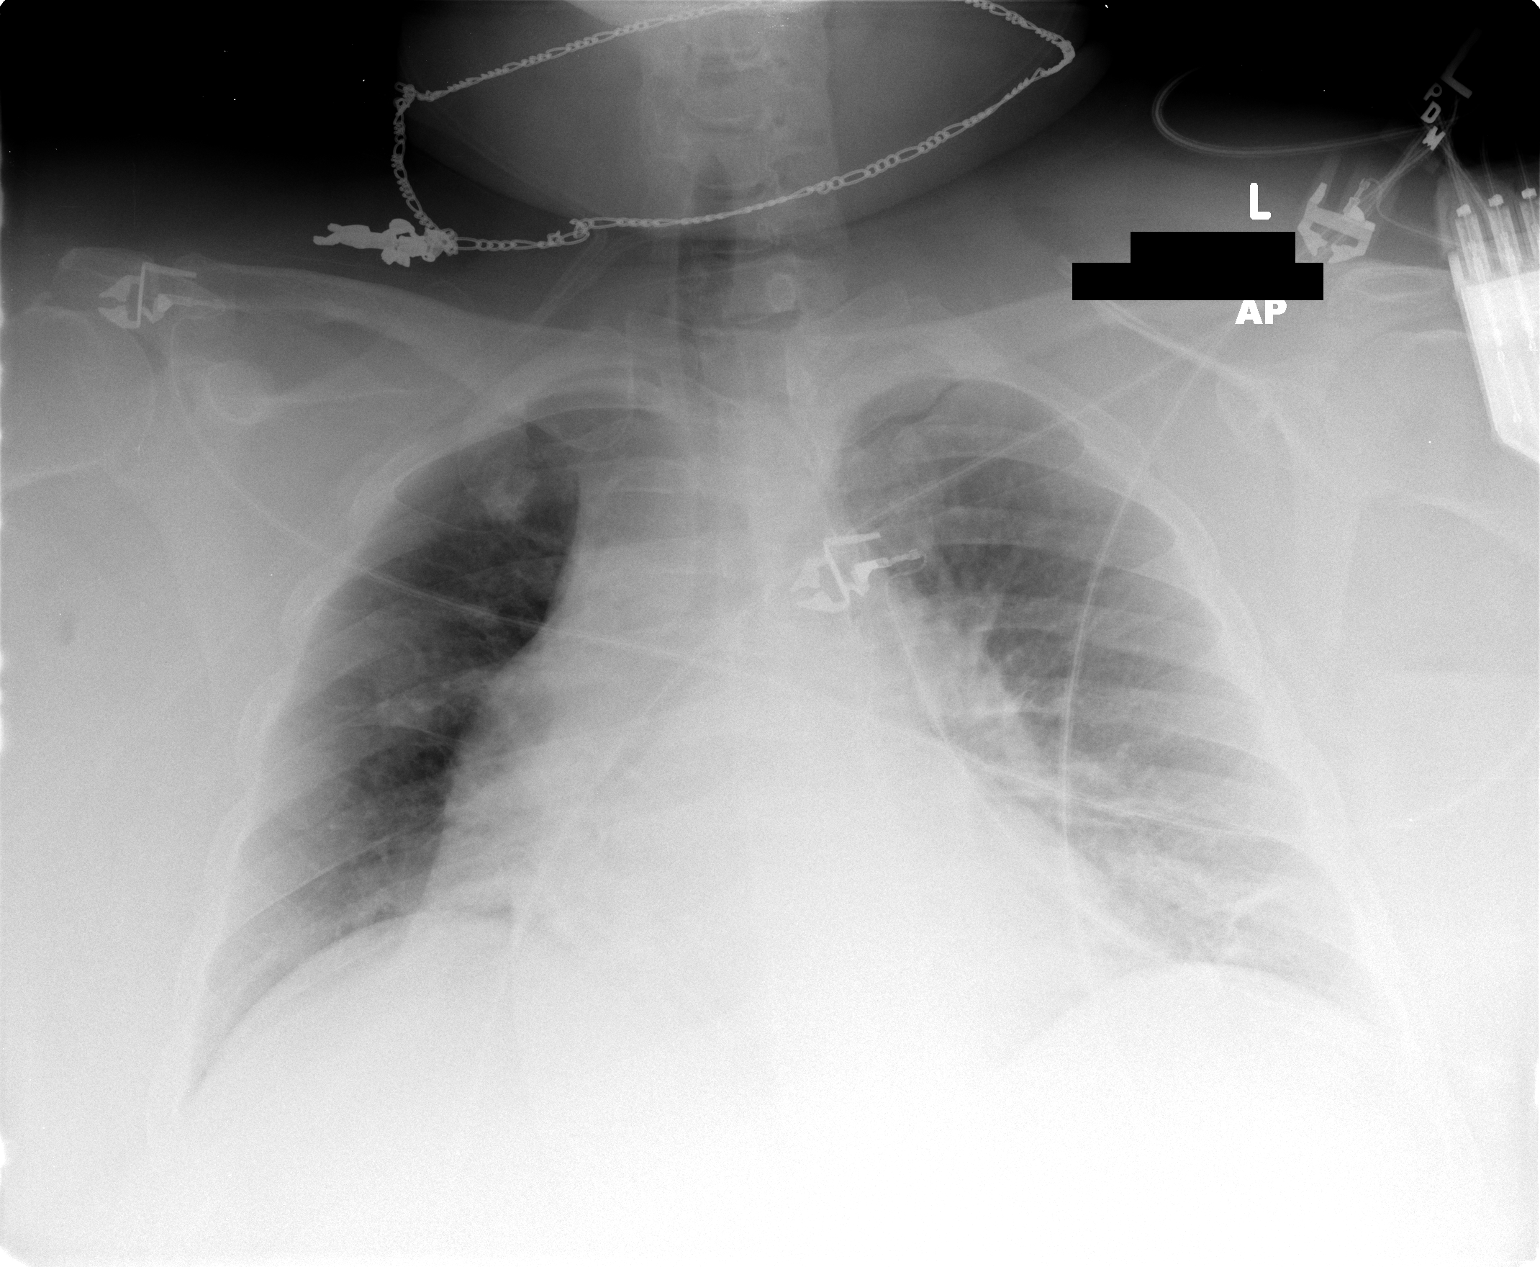

[1 of 1 positions shown; findings below may reference images not displayed]

FINDINGS: Limited exam because of portable technique and patient
body habitus.  Slight rotation to the right.  Cardiac silhouette
remains enlarged with low lung volumes and basilar atelectasis. No
large effusion or pneumothorax.  Prominent right hilar nodularity
noted, recommend follow-up PA and lateral views of the chest when
the patient is able.
IMPRESSION: Cardiomegaly with low lung volumes and basilar atelectasis
Right hilar nodularity, see above comment and recommendation

## 2010-04-15 ENCOUNTER — Encounter: Payer: Self-pay | Admitting: Endocrinology

## 2010-06-13 LAB — CBC
HCT: 35.6 % — ABNORMAL LOW (ref 36.0–46.0)
HCT: 38.3 % (ref 36.0–46.0)
Hemoglobin: 11.9 g/dL — ABNORMAL LOW (ref 12.0–15.0)
Hemoglobin: 12.9 g/dL (ref 12.0–15.0)
MCHC: 33.5 g/dL (ref 30.0–36.0)
MCHC: 33.6 g/dL (ref 30.0–36.0)
MCV: 94.8 fL (ref 78.0–100.0)
MCV: 94.8 fL (ref 78.0–100.0)
Platelets: 180 10*3/uL (ref 150–400)
Platelets: 188 10*3/uL (ref 150–400)
RBC: 3.75 MIL/uL — ABNORMAL LOW (ref 3.87–5.11)
RBC: 4.04 MIL/uL (ref 3.87–5.11)
RDW: 12.4 % (ref 11.5–15.5)
RDW: 12.6 % (ref 11.5–15.5)
WBC: 11.3 10*3/uL — ABNORMAL HIGH (ref 4.0–10.5)
WBC: 9.4 10*3/uL (ref 4.0–10.5)

## 2010-06-13 LAB — BLOOD GAS, ARTERIAL
Acid-Base Excess: 16.3 mmol/L — ABNORMAL HIGH (ref 0.0–2.0)
Bicarbonate: 41.5 mEq/L — ABNORMAL HIGH (ref 20.0–24.0)
Drawn by: 23337
O2 Content: 6 L/min
O2 Saturation: 92.9 %
Patient temperature: 98.6
TCO2: 43.4 mmol/L (ref 0–100)
pCO2 arterial: 62 mmHg (ref 35.0–45.0)
pH, Arterial: 7.441 — ABNORMAL HIGH (ref 7.350–7.400)
pO2, Arterial: 63.6 mmHg — ABNORMAL LOW (ref 80.0–100.0)

## 2010-06-13 LAB — GLUCOSE, CAPILLARY
Glucose-Capillary: 209 mg/dL — ABNORMAL HIGH (ref 70–99)
Glucose-Capillary: 209 mg/dL — ABNORMAL HIGH (ref 70–99)
Glucose-Capillary: 226 mg/dL — ABNORMAL HIGH (ref 70–99)
Glucose-Capillary: 235 mg/dL — ABNORMAL HIGH (ref 70–99)
Glucose-Capillary: 237 mg/dL — ABNORMAL HIGH (ref 70–99)
Glucose-Capillary: 245 mg/dL — ABNORMAL HIGH (ref 70–99)
Glucose-Capillary: 249 mg/dL — ABNORMAL HIGH (ref 70–99)
Glucose-Capillary: 261 mg/dL — ABNORMAL HIGH (ref 70–99)
Glucose-Capillary: 314 mg/dL — ABNORMAL HIGH (ref 70–99)
Glucose-Capillary: 314 mg/dL — ABNORMAL HIGH (ref 70–99)
Glucose-Capillary: 346 mg/dL — ABNORMAL HIGH (ref 70–99)
Glucose-Capillary: 347 mg/dL — ABNORMAL HIGH (ref 70–99)
Glucose-Capillary: 348 mg/dL — ABNORMAL HIGH (ref 70–99)
Glucose-Capillary: 348 mg/dL — ABNORMAL HIGH (ref 70–99)

## 2010-06-13 LAB — COMPREHENSIVE METABOLIC PANEL
ALT: 27 U/L (ref 0–35)
AST: 24 U/L (ref 0–37)
Albumin: 3.4 g/dL — ABNORMAL LOW (ref 3.5–5.2)
Alkaline Phosphatase: 82 U/L (ref 39–117)
BUN: 14 mg/dL (ref 6–23)
CO2: 39 mEq/L — ABNORMAL HIGH (ref 19–32)
Calcium: 9 mg/dL (ref 8.4–10.5)
Chloride: 87 mEq/L — ABNORMAL LOW (ref 96–112)
Creatinine, Ser: 0.9 mg/dL (ref 0.4–1.2)
GFR calc Af Amer: >60 mL/min (ref >60)
GFR calc non Af Amer: >60 mL/min (ref >60)
Glucose, Bld: 238 mg/dL — ABNORMAL HIGH (ref 70–99)
Potassium: 2.9 mEq/L — ABNORMAL LOW (ref 3.5–5.1)
Sodium: 136 mEq/L (ref 135–145)
Total Bilirubin: 0.9 mg/dL (ref 0.3–1.2)
Total Protein: 6.7 g/dL (ref 6.0–8.3)

## 2010-06-13 LAB — BASIC METABOLIC PANEL
BUN: 10 mg/dL (ref 6–23)
BUN: 19 mg/dL (ref 6–23)
BUN: 19 mg/dL (ref 6–23)
CO2: 31 mEq/L (ref 19–32)
CO2: 35 mEq/L — ABNORMAL HIGH (ref 19–32)
CO2: 39 mEq/L — ABNORMAL HIGH (ref 19–32)
Calcium: 8.9 mg/dL (ref 8.4–10.5)
Calcium: 9 mg/dL (ref 8.4–10.5)
Calcium: 9 mg/dL (ref 8.4–10.5)
Chloride: 87 mEq/L — ABNORMAL LOW (ref 96–112)
Chloride: 92 mEq/L — ABNORMAL LOW (ref 96–112)
Chloride: 94 mEq/L — ABNORMAL LOW (ref 96–112)
Creatinine, Ser: 0.72 mg/dL (ref 0.4–1.2)
Creatinine, Ser: 0.74 mg/dL (ref 0.4–1.2)
Creatinine, Ser: 0.91 mg/dL (ref 0.4–1.2)
GFR calc Af Amer: >60 mL/min (ref >60)
GFR calc Af Amer: >60 mL/min (ref >60)
GFR calc Af Amer: >60 mL/min (ref >60)
GFR calc non Af Amer: >60 mL/min (ref >60)
GFR calc non Af Amer: >60 mL/min (ref >60)
GFR calc non Af Amer: >60 mL/min (ref >60)
Glucose, Bld: 211 mg/dL — ABNORMAL HIGH (ref 70–99)
Glucose, Bld: 256 mg/dL — ABNORMAL HIGH (ref 70–99)
Glucose, Bld: 288 mg/dL — ABNORMAL HIGH (ref 70–99)
Potassium: 3.1 mEq/L — ABNORMAL LOW (ref 3.5–5.1)
Potassium: 3.3 mEq/L — ABNORMAL LOW (ref 3.5–5.1)
Potassium: 3.5 mEq/L (ref 3.5–5.1)
Sodium: 133 mEq/L — ABNORMAL LOW (ref 135–145)
Sodium: 134 mEq/L — ABNORMAL LOW (ref 135–145)
Sodium: 135 mEq/L (ref 135–145)

## 2010-06-13 LAB — MRSA PCR SCREENING

## 2010-06-13 LAB — BRAIN NATRIURETIC PEPTIDE
Pro B Natriuretic peptide (BNP): 36 pg/mL (ref 0.0–100.0)
Pro B Natriuretic peptide (BNP): 42 pg/mL (ref 0.0–100.0)

## 2010-06-13 LAB — D-DIMER, QUANTITATIVE: D-Dimer, Quant: 0.63 ug/mL-FEU — ABNORMAL HIGH (ref 0.00–0.48)

## 2010-06-13 LAB — PHOSPHORUS: Phosphorus: 4.7 mg/dL — ABNORMAL HIGH (ref 2.3–4.6)

## 2010-06-13 LAB — MAGNESIUM: Magnesium: 1.5 mg/dL (ref 1.5–2.5)

## 2010-06-17 LAB — CBC
HCT: 34.2 % — ABNORMAL LOW (ref 36.0–46.0)
HCT: 35.5 % — ABNORMAL LOW (ref 36.0–46.0)
HCT: 35.6 % — ABNORMAL LOW (ref 36.0–46.0)
HCT: 36.1 % (ref 36.0–46.0)
HCT: 37.2 % (ref 36.0–46.0)
HCT: 39.2 % (ref 36.0–46.0)
Hemoglobin: 11.4 g/dL — ABNORMAL LOW (ref 12.0–15.0)
Hemoglobin: 11.9 g/dL — ABNORMAL LOW (ref 12.0–15.0)
Hemoglobin: 12.1 g/dL (ref 12.0–15.0)
Hemoglobin: 12.2 g/dL (ref 12.0–15.0)
Hemoglobin: 12.9 g/dL (ref 12.0–15.0)
Hemoglobin: 12.9 g/dL (ref 12.0–15.0)
MCHC: 33 g/dL (ref 30.0–36.0)
MCHC: 33.5 g/dL (ref 30.0–36.0)
MCHC: 33.6 g/dL (ref 30.0–36.0)
MCHC: 33.9 g/dL (ref 30.0–36.0)
MCHC: 34.1 g/dL (ref 30.0–36.0)
MCHC: 34.6 g/dL (ref 30.0–36.0)
MCV: 94.8 fL (ref 78.0–100.0)
MCV: 95.1 fL (ref 78.0–100.0)
MCV: 95.3 fL (ref 78.0–100.0)
MCV: 95.7 fL (ref 78.0–100.0)
MCV: 95.7 fL (ref 78.0–100.0)
MCV: 96.3 fL (ref 78.0–100.0)
Platelets: 129 10*3/uL — ABNORMAL LOW (ref 150–400)
Platelets: 138 10*3/uL — ABNORMAL LOW (ref 150–400)
Platelets: 144 10*3/uL — ABNORMAL LOW (ref 150–400)
Platelets: 147 10*3/uL — ABNORMAL LOW (ref 150–400)
Platelets: 155 10*3/uL (ref 150–400)
Platelets: 190 10*3/uL (ref 150–400)
RBC: 3.57 MIL/uL — ABNORMAL LOW (ref 3.87–5.11)
RBC: 3.7 MIL/uL — ABNORMAL LOW (ref 3.87–5.11)
RBC: 3.75 MIL/uL — ABNORMAL LOW (ref 3.87–5.11)
RBC: 3.77 MIL/uL — ABNORMAL LOW (ref 3.87–5.11)
RBC: 3.92 MIL/uL (ref 3.87–5.11)
RBC: 4.11 MIL/uL (ref 3.87–5.11)
RDW: 12 % (ref 11.5–15.5)
RDW: 12.1 % (ref 11.5–15.5)
RDW: 12.1 % (ref 11.5–15.5)
RDW: 12.2 % (ref 11.5–15.5)
RDW: 12.3 % (ref 11.5–15.5)
RDW: 12.5 % (ref 11.5–15.5)
WBC: 10.9 10*3/uL — ABNORMAL HIGH (ref 4.0–10.5)
WBC: 6.4 10*3/uL (ref 4.0–10.5)
WBC: 6.9 10*3/uL (ref 4.0–10.5)
WBC: 7.7 10*3/uL (ref 4.0–10.5)
WBC: 7.7 10*3/uL (ref 4.0–10.5)
WBC: 8.4 10*3/uL (ref 4.0–10.5)

## 2010-06-17 LAB — URINALYSIS, ROUTINE W REFLEX MICROSCOPIC
Bilirubin Urine: NEGATIVE
Bilirubin Urine: NEGATIVE
Glucose, UA: NEGATIVE mg/dL
Glucose, UA: NEGATIVE mg/dL
Ketones, ur: NEGATIVE mg/dL
Ketones, ur: NEGATIVE mg/dL
Leukocytes, UA: NEGATIVE
Nitrite: NEGATIVE
Nitrite: POSITIVE — AB
Protein, ur: NEGATIVE mg/dL
Protein, ur: NEGATIVE mg/dL
Specific Gravity, Urine: 1.01 (ref 1.005–1.030)
Specific Gravity, Urine: 1.02 (ref 1.005–1.030)
Urobilinogen, UA: 0.2 mg/dL (ref 0.0–1.0)
Urobilinogen, UA: 1 mg/dL (ref 0.0–1.0)
pH: 5 (ref 5.0–8.0)
pH: 7 (ref 5.0–8.0)

## 2010-06-17 LAB — BASIC METABOLIC PANEL
BUN: 11 mg/dL (ref 6–23)
BUN: 12 mg/dL (ref 6–23)
BUN: 12 mg/dL (ref 6–23)
BUN: 12 mg/dL (ref 6–23)
BUN: 15 mg/dL (ref 6–23)
BUN: 17 mg/dL (ref 6–23)
BUN: 9 mg/dL (ref 6–23)
CO2: 32 mEq/L (ref 19–32)
CO2: 32 mEq/L (ref 19–32)
CO2: 33 mEq/L — ABNORMAL HIGH (ref 19–32)
CO2: 33 mEq/L — ABNORMAL HIGH (ref 19–32)
CO2: 34 mEq/L — ABNORMAL HIGH (ref 19–32)
CO2: 39 mEq/L — ABNORMAL HIGH (ref 19–32)
CO2: 41 mEq/L (ref 19–32)
Calcium: 8.8 mg/dL (ref 8.4–10.5)
Calcium: 8.8 mg/dL (ref 8.4–10.5)
Calcium: 8.8 mg/dL (ref 8.4–10.5)
Calcium: 8.9 mg/dL (ref 8.4–10.5)
Calcium: 8.9 mg/dL (ref 8.4–10.5)
Calcium: 8.9 mg/dL (ref 8.4–10.5)
Calcium: 9 mg/dL (ref 8.4–10.5)
Chloride: 102 mEq/L (ref 96–112)
Chloride: 102 mEq/L (ref 96–112)
Chloride: 90 mEq/L — ABNORMAL LOW (ref 96–112)
Chloride: 91 mEq/L — ABNORMAL LOW (ref 96–112)
Chloride: 94 mEq/L — ABNORMAL LOW (ref 96–112)
Chloride: 98 mEq/L (ref 96–112)
Chloride: 99 mEq/L (ref 96–112)
Creatinine, Ser: 0.63 mg/dL (ref 0.4–1.2)
Creatinine, Ser: 0.65 mg/dL (ref 0.4–1.2)
Creatinine, Ser: 0.68 mg/dL (ref 0.4–1.2)
Creatinine, Ser: 0.69 mg/dL (ref 0.4–1.2)
Creatinine, Ser: 0.7 mg/dL (ref 0.4–1.2)
Creatinine, Ser: 0.75 mg/dL (ref 0.4–1.2)
Creatinine, Ser: 0.76 mg/dL (ref 0.4–1.2)
GFR calc Af Amer: >60 mL/min (ref >60)
GFR calc Af Amer: >60 mL/min (ref >60)
GFR calc Af Amer: >60 mL/min (ref >60)
GFR calc Af Amer: >60 mL/min (ref >60)
GFR calc Af Amer: >60 mL/min (ref >60)
GFR calc Af Amer: >60 mL/min (ref >60)
GFR calc Af Amer: >60 mL/min (ref >60)
GFR calc non Af Amer: >60 mL/min (ref >60)
GFR calc non Af Amer: >60 mL/min (ref >60)
GFR calc non Af Amer: >60 mL/min (ref >60)
GFR calc non Af Amer: >60 mL/min (ref >60)
GFR calc non Af Amer: >60 mL/min (ref >60)
GFR calc non Af Amer: >60 mL/min (ref >60)
GFR calc non Af Amer: >60 mL/min (ref >60)
Glucose, Bld: 189 mg/dL — ABNORMAL HIGH (ref 70–99)
Glucose, Bld: 192 mg/dL — ABNORMAL HIGH (ref 70–99)
Glucose, Bld: 201 mg/dL — ABNORMAL HIGH (ref 70–99)
Glucose, Bld: 212 mg/dL — ABNORMAL HIGH (ref 70–99)
Glucose, Bld: 221 mg/dL — ABNORMAL HIGH (ref 70–99)
Glucose, Bld: 242 mg/dL — ABNORMAL HIGH (ref 70–99)
Glucose, Bld: 253 mg/dL — ABNORMAL HIGH (ref 70–99)
Potassium: 3.5 mEq/L (ref 3.5–5.1)
Potassium: 3.6 mEq/L (ref 3.5–5.1)
Potassium: 4.2 mEq/L (ref 3.5–5.1)
Potassium: 4.3 mEq/L (ref 3.5–5.1)
Potassium: 4.4 mEq/L (ref 3.5–5.1)
Potassium: 4.5 mEq/L (ref 3.5–5.1)
Potassium: 4.9 mEq/L (ref 3.5–5.1)
Sodium: 133 mEq/L — ABNORMAL LOW (ref 135–145)
Sodium: 136 mEq/L (ref 135–145)
Sodium: 136 mEq/L (ref 135–145)
Sodium: 138 mEq/L (ref 135–145)
Sodium: 138 mEq/L (ref 135–145)
Sodium: 139 mEq/L (ref 135–145)
Sodium: 141 mEq/L (ref 135–145)

## 2010-06-17 LAB — POCT I-STAT 3, ART BLOOD GAS (G3+)
Acid-Base Excess: 1 mmol/L (ref 0.0–2.0)
Bicarbonate: 29.8 mEq/L — ABNORMAL HIGH (ref 20.0–24.0)
O2 Saturation: 92 %
Patient temperature: 98.6
TCO2: 32 mmol/L (ref 0–100)
pCO2 arterial: 63.1 mmHg (ref 35.0–45.0)
pH, Arterial: 7.282 — ABNORMAL LOW (ref 7.350–7.400)
pO2, Arterial: 73 mmHg — ABNORMAL LOW (ref 80.0–100.0)

## 2010-06-17 LAB — URINE CULTURE: Colony Count: >=100000

## 2010-06-17 LAB — POCT PREGNANCY, URINE: Preg Test, Ur: NEGATIVE

## 2010-06-17 LAB — CARDIAC PANEL(CRET KIN+CKTOT+MB+TROPI)
CK, MB: 14.2 ng/mL (ref 0.3–4.0)
CK, MB: 9.2 ng/mL (ref 0.3–4.0)
Relative Index: 0.3 (ref 0.0–2.5)
Relative Index: 0.3 (ref 0.0–2.5)
Total CK: 3631 U/L — ABNORMAL HIGH (ref 7–177)
Total CK: 4479 U/L — ABNORMAL HIGH (ref 7–177)
Troponin I: 0.01 ng/mL (ref 0.00–0.06)
Troponin I: <0.01 ng/mL (ref 0.00–0.06)

## 2010-06-17 LAB — URINE MICROSCOPIC-ADD ON

## 2010-06-17 LAB — GLUCOSE, CAPILLARY
Glucose-Capillary: 166 mg/dL — ABNORMAL HIGH (ref 70–99)
Glucose-Capillary: 171 mg/dL — ABNORMAL HIGH (ref 70–99)
Glucose-Capillary: 172 mg/dL — ABNORMAL HIGH (ref 70–99)
Glucose-Capillary: 187 mg/dL — ABNORMAL HIGH (ref 70–99)
Glucose-Capillary: 190 mg/dL — ABNORMAL HIGH (ref 70–99)
Glucose-Capillary: 191 mg/dL — ABNORMAL HIGH (ref 70–99)
Glucose-Capillary: 192 mg/dL — ABNORMAL HIGH (ref 70–99)
Glucose-Capillary: 196 mg/dL — ABNORMAL HIGH (ref 70–99)
Glucose-Capillary: 197 mg/dL — ABNORMAL HIGH (ref 70–99)
Glucose-Capillary: 202 mg/dL — ABNORMAL HIGH (ref 70–99)
Glucose-Capillary: 203 mg/dL — ABNORMAL HIGH (ref 70–99)
Glucose-Capillary: 204 mg/dL — ABNORMAL HIGH (ref 70–99)
Glucose-Capillary: 211 mg/dL — ABNORMAL HIGH (ref 70–99)
Glucose-Capillary: 213 mg/dL — ABNORMAL HIGH (ref 70–99)
Glucose-Capillary: 223 mg/dL — ABNORMAL HIGH (ref 70–99)
Glucose-Capillary: 224 mg/dL — ABNORMAL HIGH (ref 70–99)
Glucose-Capillary: 225 mg/dL — ABNORMAL HIGH (ref 70–99)
Glucose-Capillary: 227 mg/dL — ABNORMAL HIGH (ref 70–99)
Glucose-Capillary: 231 mg/dL — ABNORMAL HIGH (ref 70–99)
Glucose-Capillary: 243 mg/dL — ABNORMAL HIGH (ref 70–99)
Glucose-Capillary: 245 mg/dL — ABNORMAL HIGH (ref 70–99)
Glucose-Capillary: 245 mg/dL — ABNORMAL HIGH (ref 70–99)
Glucose-Capillary: 251 mg/dL — ABNORMAL HIGH (ref 70–99)
Glucose-Capillary: 252 mg/dL — ABNORMAL HIGH (ref 70–99)
Glucose-Capillary: 253 mg/dL — ABNORMAL HIGH (ref 70–99)
Glucose-Capillary: 255 mg/dL — ABNORMAL HIGH (ref 70–99)
Glucose-Capillary: 258 mg/dL — ABNORMAL HIGH (ref 70–99)
Glucose-Capillary: 260 mg/dL — ABNORMAL HIGH (ref 70–99)
Glucose-Capillary: 261 mg/dL — ABNORMAL HIGH (ref 70–99)
Glucose-Capillary: 270 mg/dL — ABNORMAL HIGH (ref 70–99)
Glucose-Capillary: 270 mg/dL — ABNORMAL HIGH (ref 70–99)
Glucose-Capillary: 270 mg/dL — ABNORMAL HIGH (ref 70–99)
Glucose-Capillary: 271 mg/dL — ABNORMAL HIGH (ref 70–99)
Glucose-Capillary: 271 mg/dL — ABNORMAL HIGH (ref 70–99)
Glucose-Capillary: 274 mg/dL — ABNORMAL HIGH (ref 70–99)
Glucose-Capillary: 276 mg/dL — ABNORMAL HIGH (ref 70–99)
Glucose-Capillary: 279 mg/dL — ABNORMAL HIGH (ref 70–99)
Glucose-Capillary: 280 mg/dL — ABNORMAL HIGH (ref 70–99)
Glucose-Capillary: 289 mg/dL — ABNORMAL HIGH (ref 70–99)
Glucose-Capillary: 290 mg/dL — ABNORMAL HIGH (ref 70–99)
Glucose-Capillary: 297 mg/dL — ABNORMAL HIGH (ref 70–99)
Glucose-Capillary: 309 mg/dL — ABNORMAL HIGH (ref 70–99)
Glucose-Capillary: 309 mg/dL — ABNORMAL HIGH (ref 70–99)
Glucose-Capillary: 309 mg/dL — ABNORMAL HIGH (ref 70–99)
Glucose-Capillary: 329 mg/dL — ABNORMAL HIGH (ref 70–99)
Glucose-Capillary: 330 mg/dL — ABNORMAL HIGH (ref 70–99)
Glucose-Capillary: 341 mg/dL — ABNORMAL HIGH (ref 70–99)

## 2010-06-17 LAB — COMPREHENSIVE METABOLIC PANEL
ALT: 40 U/L — ABNORMAL HIGH (ref 0–35)
AST: 85 U/L — ABNORMAL HIGH (ref 0–37)
Albumin: 3.5 g/dL (ref 3.5–5.2)
Alkaline Phosphatase: 74 U/L (ref 39–117)
BUN: 54 mg/dL — ABNORMAL HIGH (ref 6–23)
CO2: 26 mEq/L (ref 19–32)
Calcium: 9.4 mg/dL (ref 8.4–10.5)
Chloride: 102 mEq/L (ref 96–112)
Creatinine, Ser: 1.63 mg/dL — ABNORMAL HIGH (ref 0.4–1.2)
GFR calc Af Amer: 42 mL/min — ABNORMAL LOW (ref >60)
GFR calc non Af Amer: 35 mL/min — ABNORMAL LOW (ref >60)
Glucose, Bld: 219 mg/dL — ABNORMAL HIGH (ref 70–99)
Potassium: 5.6 mEq/L — ABNORMAL HIGH (ref 3.5–5.1)
Sodium: 134 mEq/L — ABNORMAL LOW (ref 135–145)
Total Bilirubin: 0.5 mg/dL (ref 0.3–1.2)
Total Protein: 6.8 g/dL (ref 6.0–8.3)

## 2010-06-17 LAB — DIFFERENTIAL
Basophils Absolute: 0 10*3/uL (ref 0.0–0.1)
Basophils Relative: 0 % (ref 0–1)
Eosinophils Absolute: 0.3 10*3/uL (ref 0.0–0.7)
Eosinophils Relative: 3 % (ref 0–5)
Lymphocytes Relative: 31 % (ref 12–46)
Lymphs Abs: 3.3 10*3/uL (ref 0.7–4.0)
Monocytes Absolute: 1.3 10*3/uL — ABNORMAL HIGH (ref 0.1–1.0)
Monocytes Relative: 12 % (ref 3–12)
Neutro Abs: 5.9 10*3/uL (ref 1.7–7.7)
Neutrophils Relative %: 54 % (ref 43–77)

## 2010-06-17 LAB — LIPASE, BLOOD: Lipase: 21 U/L (ref 11–59)

## 2010-06-17 LAB — CK TOTAL AND CKMB (NOT AT ARMC)
CK, MB: 23.5 ng/mL (ref 0.3–4.0)
Relative Index: 0.4 (ref 0.0–2.5)
Total CK: 5909 U/L — ABNORMAL HIGH (ref 7–177)

## 2010-06-17 LAB — BRAIN NATRIURETIC PEPTIDE
Pro B Natriuretic peptide (BNP): 64 pg/mL (ref 0.0–100.0)
Pro B Natriuretic peptide (BNP): 75 pg/mL (ref 0.0–100.0)

## 2010-06-17 LAB — TROPONIN I: Troponin I: 0.01 ng/mL (ref 0.00–0.06)

## 2010-06-17 LAB — HEMOGLOBIN A1C
Hgb A1c MFr Bld: 8.2 % — ABNORMAL HIGH (ref 4.6–6.1)
Mean Plasma Glucose: 189 mg/dL

## 2010-06-17 LAB — TSH: TSH: 2.55 u[IU]/mL (ref 0.350–4.500)

## 2010-07-05 LAB — GLUCOSE, CAPILLARY
Glucose-Capillary: 154 mg/dL — ABNORMAL HIGH (ref 70–99)
Glucose-Capillary: 191 mg/dL — ABNORMAL HIGH (ref 70–99)
Glucose-Capillary: 192 mg/dL — ABNORMAL HIGH (ref 70–99)
Glucose-Capillary: 208 mg/dL — ABNORMAL HIGH (ref 70–99)
Glucose-Capillary: 230 mg/dL — ABNORMAL HIGH (ref 70–99)
Glucose-Capillary: 321 mg/dL — ABNORMAL HIGH (ref 70–99)
Glucose-Capillary: 355 mg/dL — ABNORMAL HIGH (ref 70–99)

## 2010-07-09 LAB — URINALYSIS, ROUTINE W REFLEX MICROSCOPIC
Glucose, UA: >1000 mg/dL — AB
Ketones, ur: 15 mg/dL — AB
Leukocytes, UA: NEGATIVE
Nitrite: NEGATIVE
Protein, ur: 30 mg/dL — AB
Specific Gravity, Urine: 1.028 (ref 1.005–1.030)
Urobilinogen, UA: 1 mg/dL (ref 0.0–1.0)
pH: 5.5 (ref 5.0–8.0)

## 2010-07-09 LAB — POCT I-STAT, CHEM 8
BUN: 39 mg/dL — ABNORMAL HIGH (ref 6–23)
Calcium, Ion: 1.07 mmol/L — ABNORMAL LOW (ref 1.12–1.32)
Chloride: 92 mEq/L — ABNORMAL LOW (ref 96–112)
Creatinine, Ser: 2.2 mg/dL — ABNORMAL HIGH (ref 0.4–1.2)
Glucose, Bld: >700 mg/dL (ref 70–99)
HCT: 43 % (ref 36.0–46.0)
Hemoglobin: 14.6 g/dL (ref 12.0–15.0)
Potassium: 5.7 mEq/L — ABNORMAL HIGH (ref 3.5–5.1)
Sodium: 127 mEq/L — ABNORMAL LOW (ref 135–145)
TCO2: 27 mmol/L (ref 0–100)

## 2010-07-09 LAB — COMPREHENSIVE METABOLIC PANEL
ALT: 23 U/L (ref 0–35)
ALT: 25 U/L (ref 0–35)
AST: 60 U/L — ABNORMAL HIGH (ref 0–37)
AST: 69 U/L — ABNORMAL HIGH (ref 0–37)
Albumin: 2.8 g/dL — ABNORMAL LOW (ref 3.5–5.2)
Albumin: 2.8 g/dL — ABNORMAL LOW (ref 3.5–5.2)
Alkaline Phosphatase: 91 U/L (ref 39–117)
Alkaline Phosphatase: 92 U/L (ref 39–117)
BUN: 35 mg/dL — ABNORMAL HIGH (ref 6–23)
BUN: 36 mg/dL — ABNORMAL HIGH (ref 6–23)
CO2: 26 mEq/L (ref 19–32)
CO2: 29 mEq/L (ref 19–32)
Calcium: 8.4 mg/dL (ref 8.4–10.5)
Calcium: 8.6 mg/dL (ref 8.4–10.5)
Chloride: 89 mEq/L — ABNORMAL LOW (ref 96–112)
Chloride: 91 mEq/L — ABNORMAL LOW (ref 96–112)
Creatinine, Ser: 1.59 mg/dL — ABNORMAL HIGH (ref 0.4–1.2)
Creatinine, Ser: 2.11 mg/dL — ABNORMAL HIGH (ref 0.4–1.2)
GFR calc Af Amer: 32 mL/min — ABNORMAL LOW (ref >60)
GFR calc Af Amer: 44 mL/min — ABNORMAL LOW (ref >60)
GFR calc non Af Amer: 26 mL/min — ABNORMAL LOW (ref >60)
GFR calc non Af Amer: 36 mL/min — ABNORMAL LOW (ref >60)
Glucose, Bld: 480 mg/dL — ABNORMAL HIGH (ref 70–99)
Glucose, Bld: 690 mg/dL (ref 70–99)
Potassium: 4.3 mEq/L (ref 3.5–5.1)
Potassium: 5.7 mEq/L — ABNORMAL HIGH (ref 3.5–5.1)
Sodium: 125 mEq/L — ABNORMAL LOW (ref 135–145)
Sodium: 131 mEq/L — ABNORMAL LOW (ref 135–145)
Total Bilirubin: 0.8 mg/dL (ref 0.3–1.2)
Total Bilirubin: 1.5 mg/dL — ABNORMAL HIGH (ref 0.3–1.2)
Total Protein: 6.2 g/dL (ref 6.0–8.3)
Total Protein: 6.3 g/dL (ref 6.0–8.3)

## 2010-07-09 LAB — BASIC METABOLIC PANEL
BUN: 10 mg/dL (ref 6–23)
BUN: 12 mg/dL (ref 6–23)
BUN: 21 mg/dL (ref 6–23)
BUN: 34 mg/dL — ABNORMAL HIGH (ref 6–23)
CO2: 30 mEq/L (ref 19–32)
CO2: 30 mEq/L (ref 19–32)
CO2: 33 mEq/L — ABNORMAL HIGH (ref 19–32)
CO2: 35 mEq/L — ABNORMAL HIGH (ref 19–32)
Calcium: 8.5 mg/dL (ref 8.4–10.5)
Calcium: 8.6 mg/dL (ref 8.4–10.5)
Calcium: 8.6 mg/dL (ref 8.4–10.5)
Calcium: 9 mg/dL (ref 8.4–10.5)
Chloride: 94 mEq/L — ABNORMAL LOW (ref 96–112)
Chloride: 95 mEq/L — ABNORMAL LOW (ref 96–112)
Chloride: 96 mEq/L (ref 96–112)
Chloride: 97 mEq/L (ref 96–112)
Creatinine, Ser: 0.48 mg/dL (ref 0.4–1.2)
Creatinine, Ser: 0.69 mg/dL (ref 0.4–1.2)
Creatinine, Ser: 0.93 mg/dL (ref 0.4–1.2)
Creatinine, Ser: 1.11 mg/dL (ref 0.4–1.2)
GFR calc Af Amer: >60 mL/min (ref >60)
GFR calc Af Amer: >60 mL/min (ref >60)
GFR calc Af Amer: >60 mL/min (ref >60)
GFR calc Af Amer: >60 mL/min (ref >60)
GFR calc non Af Amer: 55 mL/min — ABNORMAL LOW (ref >60)
GFR calc non Af Amer: >60 mL/min (ref >60)
GFR calc non Af Amer: >60 mL/min (ref >60)
GFR calc non Af Amer: >60 mL/min (ref >60)
Glucose, Bld: 109 mg/dL — ABNORMAL HIGH (ref 70–99)
Glucose, Bld: 141 mg/dL — ABNORMAL HIGH (ref 70–99)
Glucose, Bld: 174 mg/dL — ABNORMAL HIGH (ref 70–99)
Glucose, Bld: 210 mg/dL — ABNORMAL HIGH (ref 70–99)
Potassium: 3.2 mEq/L — ABNORMAL LOW (ref 3.5–5.1)
Potassium: 3.3 mEq/L — ABNORMAL LOW (ref 3.5–5.1)
Potassium: 3.6 mEq/L (ref 3.5–5.1)
Potassium: 3.7 mEq/L (ref 3.5–5.1)
Sodium: 134 mEq/L — ABNORMAL LOW (ref 135–145)
Sodium: 134 mEq/L — ABNORMAL LOW (ref 135–145)
Sodium: 137 mEq/L (ref 135–145)
Sodium: 137 mEq/L (ref 135–145)

## 2010-07-09 LAB — CBC
HCT: 36 % (ref 36.0–46.0)
HCT: 37.3 % (ref 36.0–46.0)
HCT: 38 % (ref 36.0–46.0)
HCT: 38 % (ref 36.0–46.0)
Hemoglobin: 11.4 g/dL — ABNORMAL LOW (ref 12.0–15.0)
Hemoglobin: 11.6 g/dL — ABNORMAL LOW (ref 12.0–15.0)
Hemoglobin: 11.9 g/dL — ABNORMAL LOW (ref 12.0–15.0)
Hemoglobin: 12.1 g/dL (ref 12.0–15.0)
MCHC: 31.1 g/dL (ref 30.0–36.0)
MCHC: 31.2 g/dL (ref 30.0–36.0)
MCHC: 31.6 g/dL (ref 30.0–36.0)
MCHC: 31.9 g/dL (ref 30.0–36.0)
MCV: 89.4 fL (ref 78.0–100.0)
MCV: 89.7 fL (ref 78.0–100.0)
MCV: 89.8 fL (ref 78.0–100.0)
MCV: 90 fL (ref 78.0–100.0)
Platelets: 173 10*3/uL (ref 150–400)
Platelets: 223 10*3/uL (ref 150–400)
Platelets: 244 10*3/uL (ref 150–400)
Platelets: 267 10*3/uL (ref 150–400)
RBC: 4.03 MIL/uL (ref 3.87–5.11)
RBC: 4.15 MIL/uL (ref 3.87–5.11)
RBC: 4.23 MIL/uL (ref 3.87–5.11)
RBC: 4.23 MIL/uL (ref 3.87–5.11)
RDW: 19 % — ABNORMAL HIGH (ref 11.5–15.5)
RDW: 19.1 % — ABNORMAL HIGH (ref 11.5–15.5)
RDW: 19.3 % — ABNORMAL HIGH (ref 11.5–15.5)
RDW: 19.3 % — ABNORMAL HIGH (ref 11.5–15.5)
WBC: 11.4 10*3/uL — ABNORMAL HIGH (ref 4.0–10.5)
WBC: 14 10*3/uL — ABNORMAL HIGH (ref 4.0–10.5)
WBC: 15 10*3/uL — ABNORMAL HIGH (ref 4.0–10.5)
WBC: 7.3 10*3/uL (ref 4.0–10.5)

## 2010-07-09 LAB — GLUCOSE, CAPILLARY
Glucose-Capillary: 101 mg/dL — ABNORMAL HIGH (ref 70–99)
Glucose-Capillary: 105 mg/dL — ABNORMAL HIGH (ref 70–99)
Glucose-Capillary: 109 mg/dL — ABNORMAL HIGH (ref 70–99)
Glucose-Capillary: 114 mg/dL — ABNORMAL HIGH (ref 70–99)
Glucose-Capillary: 122 mg/dL — ABNORMAL HIGH (ref 70–99)
Glucose-Capillary: 144 mg/dL — ABNORMAL HIGH (ref 70–99)
Glucose-Capillary: 149 mg/dL — ABNORMAL HIGH (ref 70–99)
Glucose-Capillary: 188 mg/dL — ABNORMAL HIGH (ref 70–99)
Glucose-Capillary: 192 mg/dL — ABNORMAL HIGH (ref 70–99)
Glucose-Capillary: 200 mg/dL — ABNORMAL HIGH (ref 70–99)
Glucose-Capillary: 210 mg/dL — ABNORMAL HIGH (ref 70–99)
Glucose-Capillary: 211 mg/dL — ABNORMAL HIGH (ref 70–99)
Glucose-Capillary: 211 mg/dL — ABNORMAL HIGH (ref 70–99)
Glucose-Capillary: 214 mg/dL — ABNORMAL HIGH (ref 70–99)
Glucose-Capillary: 223 mg/dL — ABNORMAL HIGH (ref 70–99)
Glucose-Capillary: 226 mg/dL — ABNORMAL HIGH (ref 70–99)
Glucose-Capillary: 231 mg/dL — ABNORMAL HIGH (ref 70–99)
Glucose-Capillary: 241 mg/dL — ABNORMAL HIGH (ref 70–99)
Glucose-Capillary: 250 mg/dL — ABNORMAL HIGH (ref 70–99)
Glucose-Capillary: 268 mg/dL — ABNORMAL HIGH (ref 70–99)
Glucose-Capillary: 275 mg/dL — ABNORMAL HIGH (ref 70–99)
Glucose-Capillary: 276 mg/dL — ABNORMAL HIGH (ref 70–99)
Glucose-Capillary: 341 mg/dL — ABNORMAL HIGH (ref 70–99)
Glucose-Capillary: 385 mg/dL — ABNORMAL HIGH (ref 70–99)
Glucose-Capillary: 516 mg/dL (ref 70–99)
Glucose-Capillary: 518 mg/dL (ref 70–99)
Glucose-Capillary: 537 mg/dL (ref 70–99)
Glucose-Capillary: 589 mg/dL (ref 70–99)
Glucose-Capillary: 67 mg/dL — ABNORMAL LOW (ref 70–99)
Glucose-Capillary: 83 mg/dL (ref 70–99)
Glucose-Capillary: 85 mg/dL (ref 70–99)
Glucose-Capillary: 91 mg/dL (ref 70–99)
Glucose-Capillary: >600 mg/dL (ref 70–99)

## 2010-07-09 LAB — PHOSPHORUS: Phosphorus: 2.8 mg/dL (ref 2.3–4.6)

## 2010-07-09 LAB — DIFFERENTIAL
Basophils Absolute: 0 10*3/uL (ref 0.0–0.1)
Basophils Absolute: 0.2 10*3/uL — ABNORMAL HIGH (ref 0.0–0.1)
Basophils Relative: 0 % (ref 0–1)
Basophils Relative: 1 % (ref 0–1)
Eosinophils Absolute: 0 10*3/uL (ref 0.0–0.7)
Eosinophils Absolute: 0.1 10*3/uL (ref 0.0–0.7)
Eosinophils Relative: 0 % (ref 0–5)
Eosinophils Relative: 0 % (ref 0–5)
Lymphocytes Relative: 13 % (ref 12–46)
Lymphocytes Relative: 14 % (ref 12–46)
Lymphs Abs: 1.8 10*3/uL (ref 0.7–4.0)
Lymphs Abs: 2.1 10*3/uL (ref 0.7–4.0)
Monocytes Absolute: 0.4 10*3/uL (ref 0.1–1.0)
Monocytes Absolute: 0.8 10*3/uL (ref 0.1–1.0)
Monocytes Relative: 3 % (ref 3–12)
Monocytes Relative: 5 % (ref 3–12)
Neutro Abs: 11.6 10*3/uL — ABNORMAL HIGH (ref 1.7–7.7)
Neutro Abs: 12 10*3/uL — ABNORMAL HIGH (ref 1.7–7.7)
Neutrophils Relative %: 81 % — ABNORMAL HIGH (ref 43–77)
Neutrophils Relative %: 83 % — ABNORMAL HIGH (ref 43–77)

## 2010-07-09 LAB — ACETAMINOPHEN LEVEL: Acetaminophen (Tylenol), Serum: <10 ug/mL — ABNORMAL LOW (ref 10–30)

## 2010-07-09 LAB — CK TOTAL AND CKMB (NOT AT ARMC)
CK, MB: 7.6 ng/mL — ABNORMAL HIGH (ref 0.3–4.0)
Relative Index: 0.3 (ref 0.0–2.5)
Total CK: 2314 U/L — ABNORMAL HIGH (ref 7–177)

## 2010-07-09 LAB — CULTURE, BLOOD (ROUTINE X 2)
Culture: NO GROWTH
Culture: NO GROWTH

## 2010-07-09 LAB — HEMOGLOBIN A1C
Hgb A1c MFr Bld: 9.6 % — ABNORMAL HIGH (ref 4.6–6.1)
Hgb A1c MFr Bld: 9.8 % — ABNORMAL HIGH (ref 4.6–6.1)
Mean Plasma Glucose: 229 mg/dL
Mean Plasma Glucose: 235 mg/dL

## 2010-07-09 LAB — PROTIME-INR
INR: 1.3 (ref 0.00–1.49)
INR: 1.3 (ref 0.00–1.49)
Prothrombin Time: 16.4 seconds — ABNORMAL HIGH (ref 11.6–15.2)
Prothrombin Time: 16.9 seconds — ABNORMAL HIGH (ref 11.6–15.2)

## 2010-07-09 LAB — CARDIAC PANEL(CRET KIN+CKTOT+MB+TROPI)
CK, MB: 4.7 ng/mL — ABNORMAL HIGH (ref 0.3–4.0)
Relative Index: 0.3 (ref 0.0–2.5)
Total CK: 1847 U/L — ABNORMAL HIGH (ref 7–177)
Troponin I: 0.37 ng/mL — ABNORMAL HIGH (ref 0.00–0.06)

## 2010-07-09 LAB — B. BURGDORFI ANTIBODIES: B burgdorferi Ab IgG+IgM: 0.22 {ISR}

## 2010-07-09 LAB — MAGNESIUM
Magnesium: 2.1 mg/dL (ref 1.5–2.5)
Magnesium: 2.1 mg/dL (ref 1.5–2.5)

## 2010-07-09 LAB — POCT PREGNANCY, URINE: Preg Test, Ur: NEGATIVE

## 2010-07-09 LAB — URINE MICROSCOPIC-ADD ON

## 2010-07-09 LAB — TROPONIN I
Troponin I: 0.24 ng/mL — ABNORMAL HIGH (ref 0.00–0.06)
Troponin I: 0.3 ng/mL — ABNORMAL HIGH (ref 0.00–0.06)
Troponin I: 0.34 ng/mL — ABNORMAL HIGH (ref 0.00–0.06)

## 2010-07-09 LAB — URINE CULTURE
Colony Count: NO GROWTH
Culture: NO GROWTH

## 2010-07-09 LAB — POCT CARDIAC MARKERS
CKMB, poc: 4.1 ng/mL (ref 1.0–8.0)
Myoglobin, poc: >500 ng/mL (ref 12–200)
Troponin i, poc: 0.11 ng/mL — ABNORMAL HIGH (ref 0.00–0.09)

## 2010-07-09 LAB — RAPID URINE DRUG SCREEN, HOSP PERFORMED
Amphetamines: NOT DETECTED
Barbiturates: NOT DETECTED
Benzodiazepines: POSITIVE — AB
Cocaine: NOT DETECTED
Opiates: POSITIVE — AB
Tetrahydrocannabinol: NOT DETECTED

## 2010-07-09 LAB — SALICYLATE LEVEL: Salicylate Lvl: <4 mg/dL (ref 2.8–20.0)

## 2010-07-09 LAB — HOMOCYSTEINE: Homocysteine: 10.1 umol/L (ref 4.0–15.4)

## 2010-07-09 LAB — APTT
aPTT: 35 seconds (ref 24–37)
aPTT: 40 seconds — ABNORMAL HIGH (ref 24–37)

## 2010-07-09 LAB — LIPID PANEL
Cholesterol: 120 mg/dL (ref 0–200)
HDL: 22 mg/dL — ABNORMAL LOW (ref >39)
LDL Cholesterol: 61 mg/dL (ref 0–99)
Total CHOL/HDL Ratio: 5.5 RATIO
Triglycerides: 184 mg/dL — ABNORMAL HIGH (ref <150)
VLDL: 37 mg/dL (ref 0–40)

## 2010-07-09 LAB — CK
Total CK: 289 U/L — ABNORMAL HIGH (ref 7–177)
Total CK: 501 U/L — ABNORMAL HIGH (ref 7–177)

## 2010-07-09 LAB — BRAIN NATRIURETIC PEPTIDE: Pro B Natriuretic peptide (BNP): 565 pg/mL — ABNORMAL HIGH (ref 0.0–100.0)

## 2010-07-09 LAB — LIPASE, BLOOD: Lipase: 15 U/L (ref 11–59)

## 2010-07-09 LAB — ETHANOL: Alcohol, Ethyl (B): <5 mg/dL (ref 0–10)

## 2010-07-09 LAB — TSH: TSH: 1.102 u[IU]/mL (ref 0.350–4.500)

## 2010-07-09 LAB — MYOGLOBIN, SERUM: Myoglobin: 815 ng/mL — ABNORMAL HIGH (ref <111)

## 2010-07-10 LAB — POCT CARDIAC MARKERS
CKMB, poc: 4.4 ng/mL (ref 1.0–8.0)
Myoglobin, poc: >500 ng/mL (ref 12–200)
Troponin i, poc: <0.05 ng/mL (ref 0.00–0.09)

## 2010-07-10 LAB — POCT I-STAT 3, ART BLOOD GAS (G3+)
Acid-Base Excess: 1 mmol/L (ref 0.0–2.0)
Acid-base deficit: 1 mmol/L (ref 0.0–2.0)
Acid-base deficit: 3 mmol/L — ABNORMAL HIGH (ref 0.0–2.0)
Acid-base deficit: 3 mmol/L — ABNORMAL HIGH (ref 0.0–2.0)
Acid-base deficit: 3 mmol/L — ABNORMAL HIGH (ref 0.0–2.0)
Bicarbonate: 23.1 mEq/L (ref 20.0–24.0)
Bicarbonate: 24.9 mEq/L — ABNORMAL HIGH (ref 20.0–24.0)
Bicarbonate: 25.5 mEq/L — ABNORMAL HIGH (ref 20.0–24.0)
Bicarbonate: 26.4 mEq/L — ABNORMAL HIGH (ref 20.0–24.0)
Bicarbonate: 26.9 mEq/L — ABNORMAL HIGH (ref 20.0–24.0)
O2 Saturation: 100 %
O2 Saturation: 96 %
O2 Saturation: 98 %
O2 Saturation: 98 %
O2 Saturation: 98 %
Patient temperature: 97.5
Patient temperature: 98
Patient temperature: 99
Patient temperature: 99.1
Patient temperature: 99.1
TCO2: 24 mmol/L (ref 0–100)
TCO2: 27 mmol/L (ref 0–100)
TCO2: 27 mmol/L (ref 0–100)
TCO2: 28 mmol/L (ref 0–100)
TCO2: 28 mmol/L (ref 0–100)
pCO2 arterial: 47 mmHg — ABNORMAL HIGH (ref 35.0–45.0)
pCO2 arterial: 48.5 mmHg — ABNORMAL HIGH (ref 35.0–45.0)
pCO2 arterial: 55 mmHg — ABNORMAL HIGH (ref 35.0–45.0)
pCO2 arterial: 56.4 mmHg — ABNORMAL HIGH (ref 35.0–45.0)
pCO2 arterial: 57.4 mmHg (ref 35.0–45.0)
pH, Arterial: 7.252 — ABNORMAL LOW (ref 7.350–7.400)
pH, Arterial: 7.252 — ABNORMAL LOW (ref 7.350–7.400)
pH, Arterial: 7.291 — ABNORMAL LOW (ref 7.350–7.400)
pH, Arterial: 7.3 — ABNORMAL LOW (ref 7.350–7.400)
pH, Arterial: 7.352 (ref 7.350–7.400)
pO2, Arterial: 112 mmHg — ABNORMAL HIGH (ref 80.0–100.0)
pO2, Arterial: 118 mmHg — ABNORMAL HIGH (ref 80.0–100.0)
pO2, Arterial: 122 mmHg — ABNORMAL HIGH (ref 80.0–100.0)
pO2, Arterial: 198 mmHg — ABNORMAL HIGH (ref 80.0–100.0)
pO2, Arterial: 91 mmHg (ref 80.0–100.0)

## 2010-07-10 LAB — GLUCOSE, CAPILLARY
Glucose-Capillary: 131 mg/dL — ABNORMAL HIGH (ref 70–99)
Glucose-Capillary: 158 mg/dL — ABNORMAL HIGH (ref 70–99)
Glucose-Capillary: 160 mg/dL — ABNORMAL HIGH (ref 70–99)
Glucose-Capillary: 164 mg/dL — ABNORMAL HIGH (ref 70–99)
Glucose-Capillary: 172 mg/dL — ABNORMAL HIGH (ref 70–99)
Glucose-Capillary: 184 mg/dL — ABNORMAL HIGH (ref 70–99)
Glucose-Capillary: 195 mg/dL — ABNORMAL HIGH (ref 70–99)
Glucose-Capillary: 207 mg/dL — ABNORMAL HIGH (ref 70–99)
Glucose-Capillary: 99 mg/dL (ref 70–99)
Glucose-Capillary: 100 mg/dL — ABNORMAL HIGH (ref 70–99)
Glucose-Capillary: 101 mg/dL — ABNORMAL HIGH (ref 70–99)
Glucose-Capillary: 105 mg/dL — ABNORMAL HIGH (ref 70–99)
Glucose-Capillary: 107 mg/dL — ABNORMAL HIGH (ref 70–99)
Glucose-Capillary: 108 mg/dL — ABNORMAL HIGH (ref 70–99)
Glucose-Capillary: 114 mg/dL — ABNORMAL HIGH (ref 70–99)
Glucose-Capillary: 115 mg/dL — ABNORMAL HIGH (ref 70–99)
Glucose-Capillary: 119 mg/dL — ABNORMAL HIGH (ref 70–99)
Glucose-Capillary: 124 mg/dL — ABNORMAL HIGH (ref 70–99)
Glucose-Capillary: 124 mg/dL — ABNORMAL HIGH (ref 70–99)
Glucose-Capillary: 135 mg/dL — ABNORMAL HIGH (ref 70–99)
Glucose-Capillary: 144 mg/dL — ABNORMAL HIGH (ref 70–99)
Glucose-Capillary: 145 mg/dL — ABNORMAL HIGH (ref 70–99)
Glucose-Capillary: 146 mg/dL — ABNORMAL HIGH (ref 70–99)
Glucose-Capillary: 149 mg/dL — ABNORMAL HIGH (ref 70–99)
Glucose-Capillary: 152 mg/dL — ABNORMAL HIGH (ref 70–99)
Glucose-Capillary: 155 mg/dL — ABNORMAL HIGH (ref 70–99)
Glucose-Capillary: 161 mg/dL — ABNORMAL HIGH (ref 70–99)
Glucose-Capillary: 166 mg/dL — ABNORMAL HIGH (ref 70–99)
Glucose-Capillary: 166 mg/dL — ABNORMAL HIGH (ref 70–99)
Glucose-Capillary: 169 mg/dL — ABNORMAL HIGH (ref 70–99)
Glucose-Capillary: 171 mg/dL — ABNORMAL HIGH (ref 70–99)
Glucose-Capillary: 171 mg/dL — ABNORMAL HIGH (ref 70–99)
Glucose-Capillary: 172 mg/dL — ABNORMAL HIGH (ref 70–99)
Glucose-Capillary: 173 mg/dL — ABNORMAL HIGH (ref 70–99)
Glucose-Capillary: 177 mg/dL — ABNORMAL HIGH (ref 70–99)
Glucose-Capillary: 185 mg/dL — ABNORMAL HIGH (ref 70–99)
Glucose-Capillary: 185 mg/dL — ABNORMAL HIGH (ref 70–99)
Glucose-Capillary: 187 mg/dL — ABNORMAL HIGH (ref 70–99)
Glucose-Capillary: 193 mg/dL — ABNORMAL HIGH (ref 70–99)
Glucose-Capillary: 195 mg/dL — ABNORMAL HIGH (ref 70–99)
Glucose-Capillary: 201 mg/dL — ABNORMAL HIGH (ref 70–99)
Glucose-Capillary: 215 mg/dL — ABNORMAL HIGH (ref 70–99)
Glucose-Capillary: 217 mg/dL — ABNORMAL HIGH (ref 70–99)
Glucose-Capillary: 220 mg/dL — ABNORMAL HIGH (ref 70–99)
Glucose-Capillary: 231 mg/dL — ABNORMAL HIGH (ref 70–99)
Glucose-Capillary: 235 mg/dL — ABNORMAL HIGH (ref 70–99)
Glucose-Capillary: 237 mg/dL — ABNORMAL HIGH (ref 70–99)
Glucose-Capillary: 243 mg/dL — ABNORMAL HIGH (ref 70–99)
Glucose-Capillary: 253 mg/dL — ABNORMAL HIGH (ref 70–99)
Glucose-Capillary: 269 mg/dL — ABNORMAL HIGH (ref 70–99)
Glucose-Capillary: 273 mg/dL — ABNORMAL HIGH (ref 70–99)
Glucose-Capillary: 49 mg/dL — ABNORMAL LOW (ref 70–99)
Glucose-Capillary: 54 mg/dL — ABNORMAL LOW (ref 70–99)
Glucose-Capillary: 57 mg/dL — ABNORMAL LOW (ref 70–99)
Glucose-Capillary: 60 mg/dL — ABNORMAL LOW (ref 70–99)
Glucose-Capillary: 60 mg/dL — ABNORMAL LOW (ref 70–99)
Glucose-Capillary: 66 mg/dL — ABNORMAL LOW (ref 70–99)
Glucose-Capillary: 70 mg/dL (ref 70–99)
Glucose-Capillary: 72 mg/dL (ref 70–99)
Glucose-Capillary: 74 mg/dL (ref 70–99)
Glucose-Capillary: 76 mg/dL (ref 70–99)
Glucose-Capillary: 85 mg/dL (ref 70–99)
Glucose-Capillary: 87 mg/dL (ref 70–99)
Glucose-Capillary: 88 mg/dL (ref 70–99)
Glucose-Capillary: 88 mg/dL (ref 70–99)
Glucose-Capillary: 88 mg/dL (ref 70–99)
Glucose-Capillary: 95 mg/dL (ref 70–99)

## 2010-07-10 LAB — BRAIN NATRIURETIC PEPTIDE
Pro B Natriuretic peptide (BNP): 124 pg/mL — ABNORMAL HIGH (ref 0.0–100.0)
Pro B Natriuretic peptide (BNP): 221 pg/mL — ABNORMAL HIGH (ref 0.0–100.0)
Pro B Natriuretic peptide (BNP): 66 pg/mL (ref 0.0–100.0)

## 2010-07-10 LAB — CBC
HCT: 29.2 % — ABNORMAL LOW (ref 36.0–46.0)
HCT: 29.5 % — ABNORMAL LOW (ref 36.0–46.0)
HCT: 29.8 % — ABNORMAL LOW (ref 36.0–46.0)
HCT: 30 % — ABNORMAL LOW (ref 36.0–46.0)
HCT: 30 % — ABNORMAL LOW (ref 36.0–46.0)
HCT: 30 % — ABNORMAL LOW (ref 36.0–46.0)
HCT: 30.5 % — ABNORMAL LOW (ref 36.0–46.0)
HCT: 31.6 % — ABNORMAL LOW (ref 36.0–46.0)
HCT: 31.7 % — ABNORMAL LOW (ref 36.0–46.0)
HCT: 32.2 % — ABNORMAL LOW (ref 36.0–46.0)
Hemoglobin: 10.1 g/dL — ABNORMAL LOW (ref 12.0–15.0)
Hemoglobin: 10.1 g/dL — ABNORMAL LOW (ref 12.0–15.0)
Hemoglobin: 10.4 g/dL — ABNORMAL LOW (ref 12.0–15.0)
Hemoglobin: 10.6 g/dL — ABNORMAL LOW (ref 12.0–15.0)
Hemoglobin: 10.8 g/dL — ABNORMAL LOW (ref 12.0–15.0)
Hemoglobin: 9.8 g/dL — ABNORMAL LOW (ref 12.0–15.0)
Hemoglobin: 9.8 g/dL — ABNORMAL LOW (ref 12.0–15.0)
Hemoglobin: 9.9 g/dL — ABNORMAL LOW (ref 12.0–15.0)
Hemoglobin: 9.9 g/dL — ABNORMAL LOW (ref 12.0–15.0)
Hemoglobin: 9.9 g/dL — ABNORMAL LOW (ref 12.0–15.0)
MCHC: 32.8 g/dL (ref 30.0–36.0)
MCHC: 33 g/dL (ref 30.0–36.0)
MCHC: 33 g/dL (ref 30.0–36.0)
MCHC: 33 g/dL (ref 30.0–36.0)
MCHC: 33.2 g/dL (ref 30.0–36.0)
MCHC: 33.2 g/dL (ref 30.0–36.0)
MCHC: 33.3 g/dL (ref 30.0–36.0)
MCHC: 33.3 g/dL (ref 30.0–36.0)
MCHC: 33.7 g/dL (ref 30.0–36.0)
MCHC: 33.7 g/dL (ref 30.0–36.0)
MCV: 89.2 fL (ref 78.0–100.0)
MCV: 90.4 fL (ref 78.0–100.0)
MCV: 90.9 fL (ref 78.0–100.0)
MCV: 91.1 fL (ref 78.0–100.0)
MCV: 91.4 fL (ref 78.0–100.0)
MCV: 91.4 fL (ref 78.0–100.0)
MCV: 91.6 fL (ref 78.0–100.0)
MCV: 91.6 fL (ref 78.0–100.0)
MCV: 91.9 fL (ref 78.0–100.0)
MCV: 92.2 fL (ref 78.0–100.0)
Platelets: 162 10*3/uL (ref 150–400)
Platelets: 168 10*3/uL (ref 150–400)
Platelets: 169 10*3/uL (ref 150–400)
Platelets: 182 10*3/uL (ref 150–400)
Platelets: 192 10*3/uL (ref 150–400)
Platelets: 227 10*3/uL (ref 150–400)
Platelets: 232 10*3/uL (ref 150–400)
Platelets: 235 10*3/uL (ref 150–400)
Platelets: 242 10*3/uL (ref 150–400)
Platelets: 276 10*3/uL (ref 150–400)
RBC: 3.19 MIL/uL — ABNORMAL LOW (ref 3.87–5.11)
RBC: 3.22 MIL/uL — ABNORMAL LOW (ref 3.87–5.11)
RBC: 3.24 MIL/uL — ABNORMAL LOW (ref 3.87–5.11)
RBC: 3.29 MIL/uL — ABNORMAL LOW (ref 3.87–5.11)
RBC: 3.3 MIL/uL — ABNORMAL LOW (ref 3.87–5.11)
RBC: 3.31 MIL/uL — ABNORMAL LOW (ref 3.87–5.11)
RBC: 3.36 MIL/uL — ABNORMAL LOW (ref 3.87–5.11)
RBC: 3.46 MIL/uL — ABNORMAL LOW (ref 3.87–5.11)
RBC: 3.48 MIL/uL — ABNORMAL LOW (ref 3.87–5.11)
RBC: 3.56 MIL/uL — ABNORMAL LOW (ref 3.87–5.11)
RDW: 18.8 % — ABNORMAL HIGH (ref 11.5–15.5)
RDW: 18.9 % — ABNORMAL HIGH (ref 11.5–15.5)
RDW: 18.9 % — ABNORMAL HIGH (ref 11.5–15.5)
RDW: 19.4 % — ABNORMAL HIGH (ref 11.5–15.5)
RDW: 19.6 % — ABNORMAL HIGH (ref 11.5–15.5)
RDW: 19.7 % — ABNORMAL HIGH (ref 11.5–15.5)
RDW: 19.7 % — ABNORMAL HIGH (ref 11.5–15.5)
RDW: 19.7 % — ABNORMAL HIGH (ref 11.5–15.5)
RDW: 19.9 % — ABNORMAL HIGH (ref 11.5–15.5)
RDW: 20.1 % — ABNORMAL HIGH (ref 11.5–15.5)
WBC: 10 10*3/uL (ref 4.0–10.5)
WBC: 11.2 10*3/uL — ABNORMAL HIGH (ref 4.0–10.5)
WBC: 11.9 10*3/uL — ABNORMAL HIGH (ref 4.0–10.5)
WBC: 12.9 10*3/uL — ABNORMAL HIGH (ref 4.0–10.5)
WBC: 13.5 10*3/uL — ABNORMAL HIGH (ref 4.0–10.5)
WBC: 7.4 10*3/uL (ref 4.0–10.5)
WBC: 7.9 10*3/uL (ref 4.0–10.5)
WBC: 8.4 10*3/uL (ref 4.0–10.5)
WBC: 8.4 10*3/uL (ref 4.0–10.5)
WBC: 8.7 10*3/uL (ref 4.0–10.5)

## 2010-07-10 LAB — LACTIC ACID, PLASMA: Lactic Acid, Venous: 0.5 mmol/L (ref 0.5–2.2)

## 2010-07-10 LAB — CARBOXYHEMOGLOBIN
Carboxyhemoglobin: 1 % (ref 0.5–1.5)
Carboxyhemoglobin: 1.2 % (ref 0.5–1.5)
Carboxyhemoglobin: 1.2 % (ref 0.5–1.5)
Carboxyhemoglobin: 1.3 % (ref 0.5–1.5)
Carboxyhemoglobin: 1.4 % (ref 0.5–1.5)
Methemoglobin: 0.3 % (ref 0.0–1.5)
Methemoglobin: 0.5 % (ref 0.0–1.5)
Methemoglobin: 0.7 % (ref 0.0–1.5)
Methemoglobin: 0.8 % (ref 0.0–1.5)
Methemoglobin: 0.9 % (ref 0.0–1.5)
O2 Saturation: 81.2 %
O2 Saturation: 82.6 %
O2 Saturation: 83.2 %
O2 Saturation: 84 %
O2 Saturation: 87.7 %
Total hemoglobin: 10.1 g/dL — ABNORMAL LOW (ref 12.5–16.0)
Total hemoglobin: 10.2 g/dL — ABNORMAL LOW (ref 12.5–16.0)
Total hemoglobin: 11 g/dL — ABNORMAL LOW (ref 12.5–16.0)
Total hemoglobin: 13.1 g/dL (ref 12.5–16.0)
Total hemoglobin: 9.5 g/dL — ABNORMAL LOW (ref 12.5–16.0)

## 2010-07-10 LAB — COMPREHENSIVE METABOLIC PANEL
ALT: 22 U/L (ref 0–35)
ALT: 30 U/L (ref 0–35)
ALT: 55 U/L — ABNORMAL HIGH (ref 0–35)
ALT: 56 U/L — ABNORMAL HIGH (ref 0–35)
ALT: 60 U/L — ABNORMAL HIGH (ref 0–35)
ALT: 68 U/L — ABNORMAL HIGH (ref 0–35)
AST: 116 U/L — ABNORMAL HIGH (ref 0–37)
AST: 138 U/L — ABNORMAL HIGH (ref 0–37)
AST: 153 U/L — ABNORMAL HIGH (ref 0–37)
AST: 17 U/L (ref 0–37)
AST: 180 U/L — ABNORMAL HIGH (ref 0–37)
AST: 30 U/L (ref 0–37)
Albumin: 2.5 g/dL — ABNORMAL LOW (ref 3.5–5.2)
Albumin: 2.5 g/dL — ABNORMAL LOW (ref 3.5–5.2)
Albumin: 2.6 g/dL — ABNORMAL LOW (ref 3.5–5.2)
Albumin: 2.6 g/dL — ABNORMAL LOW (ref 3.5–5.2)
Albumin: 2.8 g/dL — ABNORMAL LOW (ref 3.5–5.2)
Albumin: 2.9 g/dL — ABNORMAL LOW (ref 3.5–5.2)
Alkaline Phosphatase: 107 U/L (ref 39–117)
Alkaline Phosphatase: 109 U/L (ref 39–117)
Alkaline Phosphatase: 116 U/L (ref 39–117)
Alkaline Phosphatase: 123 U/L — ABNORMAL HIGH (ref 39–117)
Alkaline Phosphatase: 68 U/L (ref 39–117)
Alkaline Phosphatase: 69 U/L (ref 39–117)
BUN: 10 mg/dL (ref 6–23)
BUN: 28 mg/dL — ABNORMAL HIGH (ref 6–23)
BUN: 48 mg/dL — ABNORMAL HIGH (ref 6–23)
BUN: 64 mg/dL — ABNORMAL HIGH (ref 6–23)
BUN: 68 mg/dL — ABNORMAL HIGH (ref 6–23)
BUN: 7 mg/dL (ref 6–23)
CO2: 24 mEq/L (ref 19–32)
CO2: 25 mEq/L (ref 19–32)
CO2: 26 mEq/L (ref 19–32)
CO2: 29 mEq/L (ref 19–32)
CO2: 32 mEq/L (ref 19–32)
CO2: 32 mEq/L (ref 19–32)
Calcium: 8.4 mg/dL (ref 8.4–10.5)
Calcium: 8.6 mg/dL (ref 8.4–10.5)
Calcium: 8.6 mg/dL (ref 8.4–10.5)
Calcium: 9 mg/dL (ref 8.4–10.5)
Calcium: 9.2 mg/dL (ref 8.4–10.5)
Calcium: 9.6 mg/dL (ref 8.4–10.5)
Chloride: 100 mEq/L (ref 96–112)
Chloride: 103 mEq/L (ref 96–112)
Chloride: 106 mEq/L (ref 96–112)
Chloride: 110 mEq/L (ref 96–112)
Chloride: 97 mEq/L (ref 96–112)
Chloride: 99 mEq/L (ref 96–112)
Creatinine, Ser: 0.48 mg/dL (ref 0.4–1.2)
Creatinine, Ser: 0.51 mg/dL (ref 0.4–1.2)
Creatinine, Ser: 0.97 mg/dL (ref 0.4–1.2)
Creatinine, Ser: 1.72 mg/dL — ABNORMAL HIGH (ref 0.4–1.2)
Creatinine, Ser: 2.84 mg/dL — ABNORMAL HIGH (ref 0.4–1.2)
Creatinine, Ser: 3.57 mg/dL — ABNORMAL HIGH (ref 0.4–1.2)
GFR calc Af Amer: 17 mL/min — ABNORMAL LOW (ref >60)
GFR calc Af Amer: 22 mL/min — ABNORMAL LOW (ref >60)
GFR calc Af Amer: 40 mL/min — ABNORMAL LOW (ref >60)
GFR calc Af Amer: >60 mL/min (ref >60)
GFR calc Af Amer: >60 mL/min (ref >60)
GFR calc Af Amer: >60 mL/min (ref >60)
GFR calc non Af Amer: 14 mL/min — ABNORMAL LOW (ref >60)
GFR calc non Af Amer: 19 mL/min — ABNORMAL LOW (ref >60)
GFR calc non Af Amer: 33 mL/min — ABNORMAL LOW (ref >60)
GFR calc non Af Amer: >60 mL/min (ref >60)
GFR calc non Af Amer: >60 mL/min (ref >60)
GFR calc non Af Amer: >60 mL/min (ref >60)
Glucose, Bld: 105 mg/dL — ABNORMAL HIGH (ref 70–99)
Glucose, Bld: 134 mg/dL — ABNORMAL HIGH (ref 70–99)
Glucose, Bld: 158 mg/dL — ABNORMAL HIGH (ref 70–99)
Glucose, Bld: 209 mg/dL — ABNORMAL HIGH (ref 70–99)
Glucose, Bld: 51 mg/dL — ABNORMAL LOW (ref 70–99)
Glucose, Bld: 80 mg/dL (ref 70–99)
Potassium: 3.5 mEq/L (ref 3.5–5.1)
Potassium: 3.5 mEq/L (ref 3.5–5.1)
Potassium: 5.3 mEq/L — ABNORMAL HIGH (ref 3.5–5.1)
Potassium: 5.4 mEq/L — ABNORMAL HIGH (ref 3.5–5.1)
Potassium: 5.5 mEq/L — ABNORMAL HIGH (ref 3.5–5.1)
Potassium: 6.6 mEq/L (ref 3.5–5.1)
Sodium: 136 mEq/L (ref 135–145)
Sodium: 136 mEq/L (ref 135–145)
Sodium: 136 mEq/L (ref 135–145)
Sodium: 138 mEq/L (ref 135–145)
Sodium: 139 mEq/L (ref 135–145)
Sodium: 139 mEq/L (ref 135–145)
Total Bilirubin: 0.5 mg/dL (ref 0.3–1.2)
Total Bilirubin: 0.5 mg/dL (ref 0.3–1.2)
Total Bilirubin: 0.6 mg/dL (ref 0.3–1.2)
Total Bilirubin: 0.7 mg/dL (ref 0.3–1.2)
Total Bilirubin: 0.7 mg/dL (ref 0.3–1.2)
Total Bilirubin: 0.8 mg/dL (ref 0.3–1.2)
Total Protein: 5.6 g/dL — ABNORMAL LOW (ref 6.0–8.3)
Total Protein: 5.6 g/dL — ABNORMAL LOW (ref 6.0–8.3)
Total Protein: 5.6 g/dL — ABNORMAL LOW (ref 6.0–8.3)
Total Protein: 5.6 g/dL — ABNORMAL LOW (ref 6.0–8.3)
Total Protein: 6 g/dL (ref 6.0–8.3)
Total Protein: 6.3 g/dL (ref 6.0–8.3)

## 2010-07-10 LAB — PROTIME-INR
INR: 1.1 (ref 0.00–1.49)
INR: 1.2 (ref 0.00–1.49)
Prothrombin Time: 15 seconds (ref 11.6–15.2)
Prothrombin Time: 15.9 seconds — ABNORMAL HIGH (ref 11.6–15.2)

## 2010-07-10 LAB — BASIC METABOLIC PANEL
BUN: 10 mg/dL (ref 6–23)
BUN: 16 mg/dL (ref 6–23)
BUN: 14 mg/dL (ref 6–23)
BUN: 50 mg/dL — ABNORMAL HIGH (ref 6–23)
BUN: 7 mg/dL (ref 6–23)
BUN: 8 mg/dL (ref 6–23)
CO2: 32 mEq/L (ref 19–32)
CO2: 33 mEq/L — ABNORMAL HIGH (ref 19–32)
CO2: 25 mEq/L (ref 19–32)
CO2: 28 mEq/L (ref 19–32)
CO2: 32 mEq/L (ref 19–32)
CO2: 33 mEq/L — ABNORMAL HIGH (ref 19–32)
Calcium: 8.8 mg/dL (ref 8.4–10.5)
Calcium: 8.9 mg/dL (ref 8.4–10.5)
Calcium: 8.6 mg/dL (ref 8.4–10.5)
Calcium: 8.7 mg/dL (ref 8.4–10.5)
Calcium: 8.9 mg/dL (ref 8.4–10.5)
Calcium: 9.4 mg/dL (ref 8.4–10.5)
Chloride: 91 mEq/L — ABNORMAL LOW (ref 96–112)
Chloride: 93 mEq/L — ABNORMAL LOW (ref 96–112)
Chloride: 105 mEq/L (ref 96–112)
Chloride: 110 mEq/L (ref 96–112)
Chloride: 97 mEq/L (ref 96–112)
Chloride: 97 mEq/L (ref 96–112)
Creatinine, Ser: 0.63 mg/dL (ref 0.4–1.2)
Creatinine, Ser: 0.89 mg/dL (ref 0.4–1.2)
Creatinine, Ser: 0.5 mg/dL (ref 0.4–1.2)
Creatinine, Ser: 0.53 mg/dL (ref 0.4–1.2)
Creatinine, Ser: 0.57 mg/dL (ref 0.4–1.2)
Creatinine, Ser: 1.8 mg/dL — ABNORMAL HIGH (ref 0.4–1.2)
GFR calc Af Amer: >60 mL/min (ref >60)
GFR calc Af Amer: >60 mL/min (ref >60)
GFR calc Af Amer: 38 mL/min — ABNORMAL LOW (ref >60)
GFR calc Af Amer: >60 mL/min (ref >60)
GFR calc Af Amer: >60 mL/min (ref >60)
GFR calc Af Amer: >60 mL/min (ref >60)
GFR calc non Af Amer: >60 mL/min (ref >60)
GFR calc non Af Amer: >60 mL/min (ref >60)
GFR calc non Af Amer: 31 mL/min — ABNORMAL LOW (ref >60)
GFR calc non Af Amer: >60 mL/min (ref >60)
GFR calc non Af Amer: >60 mL/min (ref >60)
GFR calc non Af Amer: >60 mL/min (ref >60)
Glucose, Bld: 165 mg/dL — ABNORMAL HIGH (ref 70–99)
Glucose, Bld: 201 mg/dL — ABNORMAL HIGH (ref 70–99)
Glucose, Bld: 137 mg/dL — ABNORMAL HIGH (ref 70–99)
Glucose, Bld: 187 mg/dL — ABNORMAL HIGH (ref 70–99)
Glucose, Bld: 192 mg/dL — ABNORMAL HIGH (ref 70–99)
Glucose, Bld: 95 mg/dL (ref 70–99)
Potassium: 3.9 mEq/L (ref 3.5–5.1)
Potassium: 4 mEq/L (ref 3.5–5.1)
Potassium: 3.4 mEq/L — ABNORMAL LOW (ref 3.5–5.1)
Potassium: 3.6 mEq/L (ref 3.5–5.1)
Potassium: 3.8 mEq/L (ref 3.5–5.1)
Potassium: 5.5 mEq/L — ABNORMAL HIGH (ref 3.5–5.1)
Sodium: 134 mEq/L — ABNORMAL LOW (ref 135–145)
Sodium: 134 mEq/L — ABNORMAL LOW (ref 135–145)
Sodium: 136 mEq/L (ref 135–145)
Sodium: 136 mEq/L (ref 135–145)
Sodium: 139 mEq/L (ref 135–145)
Sodium: 140 mEq/L (ref 135–145)

## 2010-07-10 LAB — CULTURE, BLOOD (ROUTINE X 2)
Culture: NO GROWTH
Culture: NO GROWTH

## 2010-07-10 LAB — BLOOD GAS, ARTERIAL
Acid-base deficit: 2.7 mmol/L — ABNORMAL HIGH (ref 0.0–2.0)
Bicarbonate: 23.3 mEq/L (ref 20.0–24.0)
Delivery systems: POSITIVE
Drawn by: 24485
Expiratory PAP: 8
FIO2: 50 %
Inspiratory PAP: 16
Mode: POSITIVE
O2 Saturation: 98.3 %
Patient temperature: 98.9
TCO2: 25 mmol/L (ref 0–100)
pCO2 arterial: 53.9 mmHg — ABNORMAL HIGH (ref 35.0–45.0)
pH, Arterial: 7.26 — ABNORMAL LOW (ref 7.350–7.400)
pO2, Arterial: 123 mmHg — ABNORMAL HIGH (ref 80.0–100.0)

## 2010-07-10 LAB — URINE CULTURE
Colony Count: 40000
Colony Count: NO GROWTH
Culture: NO GROWTH
Special Requests: POSITIVE

## 2010-07-10 LAB — DIFFERENTIAL
Basophils Absolute: 0 10*3/uL (ref 0.0–0.1)
Basophils Absolute: 0.1 10*3/uL (ref 0.0–0.1)
Basophils Relative: 0 % (ref 0–1)
Basophils Relative: 0 % (ref 0–1)
Eosinophils Absolute: 0.3 10*3/uL (ref 0.0–0.7)
Eosinophils Absolute: 0.3 10*3/uL (ref 0.0–0.7)
Eosinophils Relative: 2 % (ref 0–5)
Eosinophils Relative: 2 % (ref 0–5)
Lymphocytes Relative: 18 % (ref 12–46)
Lymphocytes Relative: 20 % (ref 12–46)
Lymphs Abs: 2.5 10*3/uL (ref 0.7–4.0)
Lymphs Abs: 2.6 10*3/uL (ref 0.7–4.0)
Monocytes Absolute: 1.6 10*3/uL — ABNORMAL HIGH (ref 0.1–1.0)
Monocytes Absolute: 1.7 10*3/uL — ABNORMAL HIGH (ref 0.1–1.0)
Monocytes Relative: 12 % (ref 3–12)
Monocytes Relative: 13 % — ABNORMAL HIGH (ref 3–12)
Neutro Abs: 8.3 10*3/uL — ABNORMAL HIGH (ref 1.7–7.7)
Neutro Abs: 9.1 10*3/uL — ABNORMAL HIGH (ref 1.7–7.7)
Neutrophils Relative %: 64 % (ref 43–77)
Neutrophils Relative %: 68 % (ref 43–77)

## 2010-07-10 LAB — TSH
TSH: 2.308 u[IU]/mL (ref 0.350–4.500)
TSH: 4.946 u[IU]/mL — ABNORMAL HIGH (ref 0.350–4.500)

## 2010-07-10 LAB — URINALYSIS, ROUTINE W REFLEX MICROSCOPIC
Glucose, UA: NEGATIVE mg/dL
Ketones, ur: NEGATIVE mg/dL
Leukocytes, UA: NEGATIVE
Nitrite: NEGATIVE
Protein, ur: 30 mg/dL — AB
Specific Gravity, Urine: 1.018 (ref 1.005–1.030)
Urobilinogen, UA: 0.2 mg/dL (ref 0.0–1.0)
pH: 5.5 (ref 5.0–8.0)

## 2010-07-10 LAB — CARDIAC PANEL(CRET KIN+CKTOT+MB+TROPI)
CK, MB: 5 ng/mL — ABNORMAL HIGH (ref 0.3–4.0)
Relative Index: 0.2 (ref 0.0–2.5)
Total CK: 2412 U/L — ABNORMAL HIGH (ref 7–177)
Troponin I: 0.01 ng/mL (ref 0.00–0.06)

## 2010-07-10 LAB — TECHNOLOGIST SMEAR REVIEW

## 2010-07-10 LAB — URINE MICROSCOPIC-ADD ON

## 2010-07-10 LAB — SEDIMENTATION RATE: Sed Rate: 63 mm/hr — ABNORMAL HIGH (ref 0–22)

## 2010-07-10 LAB — CORTISOL: Cortisol, Plasma: 12.7 ug/dL

## 2010-07-10 LAB — PREGNANCY, URINE: Preg Test, Ur: NEGATIVE

## 2010-07-10 LAB — LIPASE, BLOOD: Lipase: 18 U/L (ref 11–59)

## 2010-07-10 LAB — POTASSIUM
Potassium: 6.1 mEq/L — ABNORMAL HIGH (ref 3.5–5.1)
Potassium: 6.5 mEq/L (ref 3.5–5.1)

## 2010-07-10 LAB — AMYLASE: Amylase: 39 U/L (ref 27–131)

## 2010-08-07 NOTE — Discharge Summary (Signed)
[name], ZMUDA NO.:  [id number]   MEDICAL RECORD NO.:  [id number]          PATIENT TYPE:  INP   LOCATION:  3019                         FACILITY:  [facility]   PHYSICIAN:  Isidor Holts, M.D.  DATE OF BIRTH:  [birth year]   DATE OF ADMISSION:  04/21/2008  DATE OF DISCHARGE:                               DISCHARGE SUMMARY   PRIMARY MEDICAL DOCTOR:  Dr. Ria Clock, [facility], [place], [place]   PRIMARY ENDOCRINOLOGIST:  Dr. Reather Littler   PRIMARY ORTHOPEDIC SURGEON/PAIN MANAGEMENT SPECIALIST:  Dr. Ruthann Cancer, [facility], [place], [place]   DISCHARGE DIAGNOSES:  1. Uncontrolled type 2 diabetes mellitus, now insulin requiring.  2. Hyperosmolar state.  3. Dehydration, acute renal insufficiency and rhabdomyolysis,      secondary to #2 above.  4. Left-sided Bell's palsy.  5. Hypothyroidism.  6. Morbid obesity.  7. Obesity hypoventilation syndrome/probable sleep apnea syndrome.  8. Recent upper respiratory tract infection.  9. History of depression.  10.Degenerative joint disease, status post multiple back      surgeries/right foot drop.  11.Chronic pain syndrome.  12.Mildly elevated cardiac enzymes, secondary to rhabdomyolysis.      Acute coronary syndrome ruled out.  13.Altered mental status secondary to #s 1, 2, and 3.   DISCHARGE MEDICATIONS:  1. Avandia 4 mg p.o. daily.  2. Celexa 60 mg p.o. daily.  3. Foltabs 1 p.o. daily.  4. Janumet (50/1000) one p.o. b.i.d.  5. Levothyroxine 50 mcg p.o. daily.  6. Lipitor 10 mg p.o. daily.  7. Nexium 40 mg p.o. daily.  8. Pregabalin 75 mg p.o. b.i.d.  9. Percocet (10/325) one p.o. p.r.n q.4 h.  10.Dilaudid 4 mg p.o. p.r.n. q.4 h.  11.Duragesic patch (100 mcg) two patches, i.e., 200 mcg to skin q.72      h.  12.Carisoprodol 350 mg p.o. q.i.d.  13.Lantus 10 units subcutaneously at bedtime.  14.DuoNeb one treatment p.r.n. q.4-6 h.   NOTE:  Alprazolam,  Amlodipine/Benazepril, Diazepam, Glipizide,  Ibuprofen, Prednisone, and Valtrex, have all been discontinued.   PROCEDURES:  1. Head CT scan dated April 21, 2008.  This showed no acute      intracranial abnormality.  Linear defects in the posterior aspect      of the calvarium near the vertex may be due to postoperative change      or old trauma.  2. Chest x-ray dated April 21, 2008.  This showed suboptimal      portable x-ray, but there does appear to be new airspace disease      throughout the right lung suspicious for pneumonia.  3. Chest x-ray repeated April 19, 2008.  This showed cardiac      enlargement with mild vascular congestion, mild bibasilar      atelectasis.  4. Chest x-ray dated April 22, 2008.  This showed probable mild      congestion/edema bilaterally.  Improvement in aeration of the right      lung without convincing infiltrate.  Cardiomegaly and bilateral      basilar atelectasis again noted.  5. A 2-D echocardiogram dated  April 22, 2008.  This was a      technically limited study.  Overall left ventricular systolic      function was normal.  Left ventricle ejection fraction was      estimated to be 60%.  This study was inadequate for evaluation of      left ventricular regional wall motion.  The ventricular wall      thickness was increased.  Left atrium was dilated.  Right atrium      was dilated.   CONSULTATIONS:  Dr. Viann Fish, cardiologist.   ADMISSION HISTORY:  As in H and P notes of April 21, 2008, dictated by  Dr. Carlena Hurl.  However, in brief, this is a 40 year old  female, with known history of morbid obesity, type 2 diabetes mellitus,  depression, degenerative disk disease, status post several lumbar  surgeries complicated by right foot drop, Lupus anticoagulant positive,  iron-deficiency anemia, thrombocytopenia, hypertension, hypothyroidism,  dyslipidemia, brought to the emergency department in an unresponsive  state.   Reportedly, the patient approximately 3 weeks ago, was diagnosed  with an upper respiratory tract illness and left-sided Bell's palsy and  subsequently treated with a full 14-day course of Avelox and prednisone  taper by her primary MD.  There is also some question about whether she  may have been treated with Valtrex.  However, we were unable to  substantiate this.  EMS was called, on the day of presentation, and  reportedly found the patient unresponsive on the floor, saturating at  50%.  She was started on non-rebreather mask, improving her saturations  to 100%.  Blood glucose was checked and found to be greater than 700.  The patient was brought to the emergency department, where she was  started on intravenous infusion of insulin per glucostabilizer protocol.  She was referred to the medical service for admission for further  evaluation, investigation and management.   CLINICAL COURSE:  1. Uncontrolled type 2 diabetes mellitus/hyperosmolar state.  The      patient at the time of presentation, was found to have blood      glucose greater than 700, BUN 39, creatinine 2.2.  Sodium was 127,      potassium 5.7, chloride 92 and CO2 was 29. As mentioned above, she      was commenced on intravenous infusion of insulin per      glucostabilizer protocol.  Serial CBGs and electrolytes were      monitored.  Intravenous fluid hydration was commenced with half-      strength normal saline.  Clinical response was satisfactory, and in      a.m. of April 21, 2008, CBGs had dropped down to 188.  We were      thus, able to transition the patient to scheduled Lantus insulin,      as well as sliding scale insulin coverage.  Over the course of next      few days, we were able to discontinue Lantus insulin because of      euglycemia and switch the patient to oral hypoglycemic medications      in pre-admission dosage.  Unfortunately, on April 24, 2008, CBGs      started creeping up again as high as  210, necessitating instituting      scheduled Lantus insulin.  Glipizide has been discontinued.      Likely, the patient's diabetes has now become insulin requiring.      She underwent diabetic teaching, including self-administration of  insulin and continues on carbohydrate modified diet.  Of note, her      hemoglobin A1c was significantly elevated at 9.8.  Fortunately, the      patient does have an endocrinologist who she will follow up with,      following discharge.   1. Dehydration, acute renal insufficiency and rhabdomyolysis.  This      was secondary to #1 above.  The patient's BUN at presentation was      39 with a creatinine of 2.2.  Total CK was 2314.  These findings      were consistent with significant dehydration and acute renal      insufficiency, as well as mild rhabdomyolysis.  However, she      responded appropriately to intravenous fluid hydration, and we are      pleased to note that as of April 24, 2008, BUN had normalized at      12, creatinine 0.93.  Total CK had by April 23, 2008, practically      normalized at 289.   1. Left Bell's palsy.  As mentioned in admission history, patient was      diagnosed with a left-sided Bell's palsy approximately 3+ weeks      ago.  She was treated by her primary MD at that time for a      concomitant URI with a 14-day course of Avelox, and a tapering      course of steroids was instituted, which she has completed.  She      has no other focal neurology, and no evidence of blistering lesions      in the left external auditory canal.  I did discuss this finding      with Dr. Sampson Goon, infectious disease specialist, via telephone.      He opined that threshold of suspicion for Lyme borreliosis was      extremely high, as this would be an unusual locality for this      condition, and the patient has had no recent travel, no camping in      wooded areas or other risk factors. Borrelia burgdorferi titers      were negative.   No specific treatment was otherwise recommended.      Over the course of the patient's hospitalization, we have noted a      gradual improvement in her left lower motor neuron facial nerve      palsy, and further improvement is anticipated.  The patient has      been reassured accordingly.   1. Hypothyroidism.  The patient was managed on pre-admission dosage of      Synthroid during the course of this hospitalization.  TSH was      normal at 1.102.   1. Elevated cardiac enzymes.  The patient's cardiac troponin-I was      initially 0.34, and subsequent troponin-I tests were reported at      0.38, then 0.3, then 0.24.  There were no acute ischemic changes on      EKG.  Cardiology consultation was kindly provided by Dr. Viann Fish, as the patient does indeed have some risk factors for      cardiac disease.  For details of Dr. York Spaniel consultation, refer      to consultation notes of April 22, 2008.  The patient underwent 2-      D echocardiogram on that date.  For details of findings, refer to  procedure list above.  Dr. Donnie Aho has recommended no further      cardiology workup.  Clearly, the patient does not have acute      coronary syndrome, and it is likely that her elevated troponin-I      may be secondary to her rhabdomyolysis.  He did, of course, request      that we consider workup for obesity hypoventilation      syndrome/obstructive sleep apnea syndrome.   1. Chronic pain syndrome.  The patient was managed for this, with pre-      admission analgesic medications, with satisfactory response.  She      underwent physiotherapy/occupational therapy during the course of      this hospitalization, and has benefitted from utilization of a      walker.  Continued PT/OT are recommended on an outpatient basis.      Short-term skilled nursing facility for deconditioning was offered      to the patient, however, she declined.   1. History of depression.  The patient's mood  remained stable during      the course of this hospitalization, on pre-admission antidepressant      medication.   1. Altered mental status.  This was the patient's presenting symptom      and was secondary to her acute medical problems, as outlined above.      During the course of her hospitalization, with improvement in      metabolic abnormalities, the patient's mental status improved and      by April 23, 2008, had completely normalized.   1. Obesity hypoventilation syndrome/obstructive sleep apnea syndrome.      On April 25, 2008, she was considered clinically stable for      discharge.  However, the patient is of course, morbidly obese, and      has been noted to have oxygen desaturation down to 82% to 84% on      ambulation.  She tells me that about 5 years ago, she had a sleep      study done, which was negative.  At this point, however, it appears      that she does indeed have OHS/OSA and will benefit from a formal      sleep study done on an outpatient basis.  We have recommended this      to the patient's primary care physician to arrange.  At present she      appears to meet the criteria for short-term home oxygen.   DISPOSITION:  The patient was on April 25, 2008, considered clinically  stable for discharge.  However, as mentioned above, she has declined  short-term SNF for rehab and physiotherapy, preferring to be discharged  to her own home and states that her significant other will be home on  leave from April 28, 2008, and will be able to provide her 24-hour  care and support.  With this in mind, home health PT/OT has been  arranged as well as needed equipment, including a walker and a heavy  duty 3-in-1 commode.  Antihypertensive medications have been  discontinued, as the patient had normal blood pressure during the course  of this hospitalization, until reviewed by patient's primary MD.  Her  benzodiazepines have also been discontinued in view of her   hypoventilation syndrome.  We shall defer review of these medications,  to her primary MD.   DIET:  Heart-healthy/carbohydrate modified.   ACTIVITY:  As tolerated.  Recommended to increase activity  slowly;  otherwise, per PT/OT.   WOUND CARE:  The patient was noted to have a stage I decubitus ulcer at  her left heel.  Heel pad has been supplied.   FOLLOWUP INSTRUCTIONS:  It has been recommended that patient follow up  with her primary orthopedic surgeon and pain management specialist, Dr.  Gerrit Heck, at [facility], [place], [place].  She is also to  follow up with her primary endocrinologist, Dr. Reather Littler, per prior  scheduled appointment.  In addition, she is to follow up with her  primary MD, Dr. Dalbert Mayotte, [facility],  [place], [place], certainly within 1-2 weeks of discharge.  She is instructed to call for an appointment.  All this has been  communicated to the patient.  She has verbalized understanding.   SPECIAL INSTRUCTIONS:  Patient`s primary MD is recommended to arrange  referral for polysomnography, to evaluate for possible sleep-apnea  syndrome. HHPT/OT/RN, walker/3-in-1 Commode, and short term oxygen  supplementation, have been arranged.   NOTE:  Medication list may be updated/modified at the time of actual  discharge in an addendum by discharging MD, and any further changes to  disposition will also be updated.      Isidor Holts, M.D.  Electronically Signed     CO/MEDQ  D:  04/25/2008  T:  04/25/2008  Job:  [id number]   cc:   Ruthann Cancer, M.D.  Reather Littler, M.D.  Dalbert Mayotte, M.D.

## 2010-08-07 NOTE — Consult Note (Signed)
[name], COWGER NO.:  [id number]   MEDICAL RECORD NO.:  [id number]          PATIENT TYPE:  INP   LOCATION:  3033                         FACILITY:  [facility]   PHYSICIAN:  Georga Hacking, M.D.DATE OF BIRTH:  [birth year]   DATE OF CONSULTATION:  04/22/2008  DATE OF DISCHARGE:                                 CONSULTATION   REASON FOR CONSULTATION:  Abnormal cardiac enzymes in a patient with no  cardiac symptoms.   HISTORY:  The patient is a severely morbidly obese female whom I am  asked to see for cardiac enzymes.  She has a history of severe morbid  obesity, severe diabetes mellitus, previous severe chronic pain  syndrome, and lumbar disk disease.  She has had chronic narcotic  dependence and has a complaint of toxic mold exposure in the past.  She  has been extensively evaluated by the gastroenterologist and the  pulmonary physicians previously.  She was last hospitalized at [facility] with dyspnea and pneumonia and developed CO2 retention  during that admission.  She recently has been treated for a Bell palsy  and possible pneumonia, and she was found unresponsive at home with blue  lips and blue extremities.  She fell to the ground and was found  unresponsive and at later time her boyfriend called EMT and she was  unresponsive with reduced arterial oxygen saturations.  She was found to  have hyperosmolar state and was brought to the emergency room where she  was treated with insulin.  At no time did she have any chest pain or  severe shortness of breath.  She has recovered since then and had a CPK  that was over 1000, but MB was only 4.7.  She had mild elevations of  troponin at 0.37, 0.30, and 0.24.  Myoglobin was elevated at 815.  Hemoglobin A1c was 9.8.  Her EKG was normal and she has never had chest  pain or arrhythmias.   Her past history is remarkable for longstanding diabetes mellitus,  severe morbid obesity, and hypertension.  She has a  chronic pain  syndrome with narcotic dependency.  She has been worked up previously in  the past and has had degenerative disk disease.  She has had iron-  deficiency anemia previously in the past.  She has had previous  depression and hypothyroidism.   PREVIOUS SURGICAL HISTORY:  She has had repair of an ankle fracture.  She has previously had a cholecystectomy.  She has had previous lumbar  disk surgery.  She has had severe morbid obesity and gastroesophageal  reflux disease.  She has a history of thrombocytopenia, thought due to  iron deficiency and chronic pain syndrome.   ALLERGIES:  Listed in the charge are to AMOXICILLIN, BUTORPHANOL,  STADOL, and to SULFA.   MEDICATIONS:  See the chart.   FAMILY HISTORY:  I think both her mother and father who died of  complications of vascular disease.  Mother had coronary artery disease,  diabetes, and morbid obesity.  Father had renovascular hypertension and  coronary artery disease.   SOCIAL HISTORY:  She is  disabled, has a foot drop, has a boyfriend that  lives with her.  No known children and.  She is a nonsmoker.  Does not  use alcohol to excess.   REVIEW OF SYSTEMS:  Difficult.  She has severe malaise and fatigue.  She  has had a recent facial droop.  She has not had any recent eye problems.  She has chronic reflux symptoms and has some mild chronic dyspnea.  She  has had a previous gastrointestinal evaluation by Dr. Matthias Hughs.  She has  urinary frequency.  She has a foot drop secondary to the previous  surgery.  She has had a previous Bell palsy.  Other than as noted above,  the remainder of systems is unremarkable.   PHYSICAL EXAMINATION:  GENERAL:  She is an extremely obese female with  rosy red cheeks.  VITAL SIGNS:  Her blood pressure is currently 116/55.  Pulse is  currently 90 and regular.  SKIN:  Warm and dry.  ENT:  EOMI.  PERRLA.  CNS clear.  Fundi not examined.  Pharynx negative.  I cannot assess JVD.  LUNGS:  Reduced  breath sounds.  CARDIOVASCULAR:  Normal S1 and S2.  No S3 or murmur.  ABDOMEN:  Severely obese and grossly normal.  EXTREMITIES:  Legs are very large.  There is a foot drop present.  She  has a Bell palsy present on the left side.   Her EKG is completely normal.   LABORATORY DATA:  White count 15,000 and platelet count 244,000.  Protime is 16.9 with an INR of 1.3.  Sodium is currently 134, potassium  3.2, BUN is 21, and creatinine 0.69 following hydration.  Hemoglobin A1c  is 9.8.  CPK today is 501.   IMPRESSION:  1. Abnormal troponin I in the setting of unresponsiveness,      rhabdomyolysis, and multiple cardiovascular risk factors.  I      suspect that this is due to the rhabdomyolysis or demand ischemia      related to her unresponsive event and decreased oxygen saturations.  2. Morbid obesity.  3. Diabetes mellitus.  4. Bell palsy.  5. Hypothyroidism.  6. Chronic pain syndrome.  7. Hypertension.  8. Severe chronic pain syndrome with previous lumbar disk disease and      previous ankle problems.   RECOMMENDATIONS:  I will check an echocardiogram on her.  At the present  time, she is at risk for pulmonary emboli as well as obesity-  hypoventilation syndrome, we will consider workup for that.  I do not  think that she needs to have any further workup of the abnormal  troponins as her EKG is normal and she has no cardiac symptoms.  Her  morbid obesity precludes nuclear stress testing and I really do not  think at this time that the catheterization would be warranted in her as  she is asymptomatic.  Obviously, needs to have lifestyle changes and  recommendations.      Georga Hacking, M.D.  Electronically Signed     WST/MEDQ  D:  04/22/2008  T:  04/23/2008  Job:  [id number]   cc:   Isidor Holts, M.D.

## 2010-08-07 NOTE — H&P (Signed)
[name], [name]             ACCOUNT NO.:  [id number]   MEDICAL RECORD NO.:  [id number]          PATIENT TYPE:  INP   LOCATION:  3315                         FACILITY:  [facility]   PHYSICIAN:  Carlena Hurl, MDDATE OF BIRTH:  1971-01-28   DATE OF ADMISSION:  04/21/2008  DATE OF DISCHARGE:                              HISTORY & PHYSICAL   CHIEF COMPLAINT:  This patient has been brought by EMS in unresponsive  state.   HISTORY OF PRESENT ILLNESS:  This is a 40 year old very morbidly obese  Caucasian female has the past medical history significant for morbid  obesity, diabetes mellitus, hypertension, chronic pain syndrome,  depression who was brought to the ER, after EMT found her to be  unresponsive at home with blue colored lips and blue extremities.  By  the time I saw the patient, the patient is not responding and is deeply  asleep and arousable only on deep palpation, that is why history could  not obtained from the patient and their is no family around at this  time.  So most of the history is obtained from the nurse who is taking  care of the patient as well as from the ER records.  According to the  chart, this patient was at home yesterday morning and she complained of  some congestion to her husband and later husband told her to go to the  ER, but the patient did not go and she fell on the ground and was found  unresponsive.  During that time, her husband called the EMT and later  EMT found her to be unresponsive on the floor and saturating of 50%.  Immediately, the patient was started on non-rebreather and immediately  her saturations improved to 100% on non-rebreather.  The patient's blood  glucose were found to be very high greater than 600 and immediately the  patient was brought to the ER.  When patient came to the ER, her blood  sugars were high, and she was started on insulin and for sometime the  patient did respond to the nurses down in the ER but when  she was  transferred over the step down, the patient is still fast asleep and not  responding.   PAST MEDICAL HISTORY:  Obtained from the old chart is significant for  diabetes, hypertension, morbid obesity, depression, chronic pain  syndrome, degenerative disk disease, status post severe lumbar  surgeries, lupus anticoagulant positive and iron deficiency and  thrombocytopenia with mild leukopenia.   SURGICAL HISTORY:  Could not be obtained at this time.   ALLERGIES:  Allergic to SULFA and STADOL.   FAMILY HISTORY:  Could not be obtained at this time.   SOCIAL HISTORY:  Also, could not be obtained at this time.   REVIEW OF SYSTEMS:  Also, could not be obtained at this time as the  patient is not responding and there is no family around.   MEDICATIONS:  Medications that this patient is on at home are Avandia,  Avelox, Celexa, diazepam, Foltabs, glipizide, ibuprofen, amlodipine,  alprazolam, Janumet, levothyroxine, Lipitor, Lotrel, Nexium, prednisone,  Pregabalin, Valtrex, Vinate PN Care.  PHYSICAL EXAMINATION:  GENERAL:  This is 40 year old morbidly obese  Caucasian female who is lying comfortably on the bed, but not responding  without any worsening shortness of breath, no severe chest pain.  HEENT:  Head is atraumatic, normocephalic.  Pupils could not be examined  as the patient is not opening her eyes.  NECK:  Supple.  JVD could not be appreciated.  LUNGS:  Clear to auscultation bilaterally.  CVS:  S1 and S2 heard with regular rate and rhythm.  ABDOMEN:  Soft.  Bowel sounds present.  A huge abdomen is folded on  itself and there is slight inflammation  noted on the anterior wall of  the abdomen with no clear-cut skin break down.  EXTREMITIES:  No pedal edema noted.  Pulses are palpable bilaterally.   LABORATORY DATA:  Ionized calcium of 1.07, hemoglobin 14.6, hematocrit  43.0.  Sodium 127, potassium 5.7, chloride 92, glucose of greater than  700, BUN 39, creatinine 2.2.   Total CK of 2314, CK-MB 7.6, troponin I  0.34, CK-MB 4.1, and second set of troponin I 0.11, myoglobin greater  than 500.  Urine pregnancy is negative and salicylate is less than 40,  acetaminophen less than 10.  Urine drug screen positive for opioids and  benzo.  Urinalysis showed glucose of greater than 1000, ketones 15,  trace blood, protein of 30, rbc's of 3-6, and the patient had CT head  done because of unresponsiveness, which showed no acute intracranial  abnormality.   ASSESSMENT AND PLAN:  This is a 40 year old lady morbidly obese with  significant history of diabetes, hypertension, who was brought here with  unresponsive state and following to have a glucose greater than 600 and  total CK elevated.  1. Altered mental status.  It is mostly secondary to hyperosmolar      nonketotic coma as the patient's sugars are elevated and she is not      on insulin at home as the family is not around, we do not know her      diabetic status at this time.  So, we are going to start this      patient on Glucommander and slowly bring down her sugars.  CT scan      of the head is negative.  2. Hyperosmolar nonketotic diabetic coma.  At this time, there is no      anion gap and the patient's sugars are still running about 500 and      she was started on Glucommander and we will slowly taper the      insulin depending upon her sugar values.  We will also go ahead and      get HbA1c on this patient.  3. Rhabdomyolysis.  At this time, the etiology for rhabdomyolysis is      unknown.  As this patient has a history of hypothyroidism and we do      not have the TSH values, sometimes severe hypothyroidism can cause      rhabdomyolysis.  So we are going to go ahead and get his TSH value,      and the patient serum myoglobin is positive and we will repeat      total CK values as it is initial CK value is very high and we will      give her a fluid challenge with IV normal saline.  4. Acute on chronic  renal failure.  This patient's baseline creatinine      is 0.5, so  at this time it is most likely secondary to prerenal      secondary to rhabdomyolysis.  The patient will be given IV fluid      and we will monitor her serum-creatinine.  5. History of diabetes mellitus.  At this time, as the patient is in      hyperosmolar nonketotic coma, we will hold all her home medications      and continue this patient on Glucommander and we will get a HbA1c      and further management depends once the patient is clinically      stable.  6. History of hypothyroidism.  We will go ahead and get TSH value and      continue her home dose of Synthroid at this time.  7. History of hypertension.  The patient's blood pressure have been      stable.  We will continue her home dose of medication.  8. History of chronic pain syndrome.  We will hold all her pain      medications as the patient has an altered mental status.  9. History of recent onset of Bell's palsy.  We will hold the      medications for that now and once the family is here,      we will get the further history regarding her Bell's palsy.  The      patient will be started on deep venous thrombosis prophylaxis with      heparin of 5000 units subcu 3 times a day for gastrointestinal      prophylaxis.  The patient will be placed on 40 mg Protonix IV one      time a day.      Carlena Hurl, MD  Electronically Signed     JD/MEDQ  D:  04/21/2008  T:  04/21/2008  Job:  [id number]

## 2010-08-07 NOTE — H&P (Signed)
NAMEGWENETTE, [name] NO.:  [id number]   MEDICAL RECORD NO.:  [id number]          PATIENT TYPE:  INP   LOCATION:  2307                         FACILITY:  [facility]   PHYSICIAN:  Lucita Ferrara, MD         DATE OF BIRTH:  Aug 17, 1970   DATE OF ADMISSION:  05/13/2008  DATE OF DISCHARGE:                              HISTORY & PHYSICAL   HISTORY OF PRESENT ILLNESS:  The patient is a 40 year old morbidly obese  female who presented with hypoglycemia, hypotension, and alterations in  her mental status.  The history was provided by her spouse who is by  bedside.  The patient basically has been bed bound for the last two or  three days and has not been able to eat or perform her activities of  daily living.  She was found to be febrile at home and her blood sugars  were found to be severely low.  EMS was called and the rest of the  history and physical examination cannot be delineated secondary to lack  of history and the patient's alterations in her mental status.   PAST MEDICAL HISTORY:  Significant for:  1. Diabetes.  2. Hypertension.  3. Morbid obesity.  4. Depression.  5. Chronic pain syndrome.  6. Degenerative disc disease.  7. Status post lumbar surgery.  8. Lupus anticoagulant positive.  9. Iron deficiency anemia.  10.Thrombocytopenia.   SOCIAL HISTORY:  Currently residing at home, has no children, is bed  bound.  No history of drug abuse.  She was previously employed as a  Chartered certified accountant.  She is a nonsmoker.  PCP in [place].  Endocrinologist:  Dr. Lucianne Muss.  No recent immunizations.   PAST SURGICAL HISTORY:  Status post back surgery, status post  cholecystectomy.   ALLERGIES:  PENICILLIN and SULFA.   MEDICATIONS:  1. Alprazolam 1 mg p.o. every 8 hours.  2. Amlodipine 5/20 once daily.  3. Avandia 4 mg p.o. daily.  4. Avelox 400 mg p.o. daily.  5. Celexa 60 mg p.o. daily.  6. Diazepam 10 mg p.o. every 8 hours.  7. Foltabs one tab p.o. daily.  8. Glipizide 10 mg p.o. daily.  9. Ibuprofen 800 mg p.o. three times a day.  10.Janumet 50/1000 mg p.o. b.i.d.  11.Levothyroxine 0.5 mg p.o. once a day.  12.Lipitor 10 mg p.o. daily.  13.Nexium 40 mg p.o. daily.  14.Prednisone 20 mg p.o. daily.  15.Pregabalin 75 mg p.o. b.i.d.  16.Valtrex 1 gram p.o. every 8 hours.   REVIEW OF SYSTEMS:  As per HPI, otherwise negative.   PHYSICAL EXAMINATION:  GENERAL:  The patient is a morbidly obese female  in no acute distress.  Blood pressure now is 91/78.  HEENT:  Mucous membranes dry.  NECK:  Supple.  CARDIOVASCULAR:  S1, S2.  ABDOMEN:  Morbidly obese, soft, nontender.  EXTREMITIES:  No clubbing, cyanosis or edema.  NEURO:  Exam cannot be fully delineated secondary to the patient's  mental status.   EKG shows sinus tachycardia.   LABORATORY DATA:  Potassium high at 6.5, hemolyzed.  Urinalysis:  Rare  bacteriuria,  negative leukocytes.  CBC:  White count 15.5.  Urine  pregnancy test negative.  Chest x-ray shows stable cardiomegaly, no  other cardiopulmonary disease.   IMPRESSION:  The patient is a 40 year old morbidly obese with:  1. Hypotension, hypoglycemia, mild leukocytosis; sepsis until proven      otherwise.  2. Morbid obesity.  3. Chronically bed bound secondary to morbid obesity.  4. Other medical problems including history of diabetic ketoacidosis      and rhabdomyolysis, renal failure, hypothyroidism, iron deficiency,      positive lupus anticoagulant, foot drop, degenerative joint      disease, and diabetes.   DISCUSSION AND PLAN:  The patient will be admitted to the intensive care  unit.  The patient will be fluid resuscitated given her prerenal  azotemia and dehydration.  Intensive care consultation has already been  made.  The patient will be kept NPO except meds.  Vitals per floor  protocol.  The patient's blood pressure medications will be put on hold.  Strict Is and Os and daily weights.  The patient will be  empirically  started on Duripenem and vancomycin.  Blood cultures will be drawn.  KUB  and chest x-ray.  The rest of the plans will depend on progress and  consultant recommendations.      Lucita Ferrara, MD  Electronically Signed     RR/MEDQ  D:  05/14/2008  T:  05/14/2008  Job:  [id number]

## 2010-08-07 NOTE — Discharge Summary (Signed)
[name], CHUBBUCK NO.:  [id number]   MEDICAL RECORD NO.:  [id number]          PATIENT TYPE:  INP   LOCATION:  3019                         FACILITY:  [facility]   PHYSICIAN:  Monte Fantasia, MD  DATE OF BIRTH:  06-Aug-1970   DATE OF ADMISSION:  04/21/2008  DATE OF DISCHARGE:  04/27/2008                               DISCHARGE SUMMARY   ADDENDUM:  Please refer to the discharge summary dictated prior by Dr.  Brien Few for the patient on April 25, 2008.  There has been no interval  change in her clinical condition.  The patient is stable to be  discharged and can be discharged home with home oxygen for short term at  3 L per minute.  The patient has been counseled to follow up with her  primary care physician, Dr. Lucianne Muss, her primary endocrinologist, and Dr.  Drue Second who is of Family Medicine, [facility], to follow up with them  in 1 or 2 weeks.  The patient verbalized understanding regarding the  same.  The patient will also need sleep apnea study in view of her  morbid obesity for possible sleep apnea or obesity hypoventilation  syndrome.  There has been no change in the discharge diagnosis and the  discharge medications at the time of discharge.  We will discharge the  patient as per dictated on the prior discharge summary with the  discharge medications.  We will arrange for home oxygen for short term  at 3 L per minute and need to follow up with her primary care physician  regarding the same for titrating to discontinue as needed.  The patient  at present is stable to be discharged and can be discharged home.  Home  health PT and OT and RN have been arranged along with walker with 3-in-1  commode and short-term oxygen supplementation.      Monte Fantasia, MD  Electronically Signed     MP/MEDQ  D:  04/27/2008  T:  04/28/2008  Job:  [id number]

## 2010-08-07 NOTE — Discharge Summary (Signed)
[name], [name] NO.:  [id number]   MEDICAL RECORD NO.:  [id number]          PATIENT TYPE:  INP   LOCATION:  5526                         FACILITY:  [facility]   PHYSICIAN:  [name], MDDATE OF BIRTH:  09/09/1970   DATE OF ADMISSION:  05/13/2008  DATE OF DISCHARGE:  05/24/2008                               DISCHARGE SUMMARY   ADDENDUM:   DISCHARGE DISPOSITION:  Skilled nursing facility.   FINAL DISCHARGE DIAGNOSES.:  1. Acute on chronic hypoxic and hypercapnic respiratory failure,      resolved.  2. Cardiovascular shock, resolved.  3. Sepsis, resolved.  4. Hypoglycemia, resolved.  5. Altered mentation secondary to the above diagnoses.  6. Right lower lobe pneumonia, fully treated.  7. Acute renal failure, resolved.  8. Diabetes type 2 with hypoglycemia.  9. Hypertension.  10.History of depression.  11.Chronic pain syndrome.  12.Migraine headaches.  13.History of hypothyroidism.  14.History of hyperlipidemia.  15.History of gastroesophageal reflux disease.  16.History of neuropathic pain.  17.Chronic hypoxic and hypercarbic respiratory failure.   DISCHARGE MEDICATIONS:  Include the following:  1. Celexa 60 mg p.o. daily.  2. Foltx 1 tablet p.o. daily.  3. Levothyroxine 15 mcg p.o. daily.  4. Lipitor 10 mg p.o. daily.  5. Nexium 40 mg p.o. daily.  6. Gabapentin 75 mg p.o. b.i.d.  7. Duragesic transdermal system 100 mcg times two q.72h. transdermally  8. Soma 350 mg p.o. b.i.d.  9. Lantus 10 units subcutaneous nightly.  10.Percocet 10/325 one tab q.4h. p.r.n.  11.Januvia 50 mg p.o. b.i.d.  12.Dilaudid 4 mg p.o. q.4h. p.r.n.  13.DuoNeb inhaled q.4h. p.r.n.  14.Treximet 1 tablet p.o. p.r.n. migraine may be repeated in 2 hours      if needed.  15.Lotrimin apply to skin topically b.i.d.  16.MiraLax 17 grams in 8 ounces of fluid daily.  17.Prednisone 10 mg p.o. daily x2 days then stop.  18.Thiamine 100 mg p.o. daily.  19.Humalog sliding  scale insulin.   HOSPITAL COURSE:  1. Please note that this summary summarizes the patient's hospital      course from February 27, until today, the time of discharge.      Please note the patient was initially cared for by the critical      care medicine in the ICU.  The patient was then transferred to the      [facility] service.  By the time we saw the patient most of her      issues had been resolved and the active issues left included the      completion of treatment for her pneumonia, the resolution of the      hypoglycemia associated with her diabetes and weaning of the      prednisone.  In addition, the patient is morbidly obese and had      generalized weakness and needed to be placed in a skilled nursing      facility.  Most of the hospital course between the twenty-seventh      and now has involved the patient waiting for placement.  However,      in that  time the patient had been slowly weaned from the prednisone      and is now down to 10 mg prednisone.  Please note that the      prednisone was initially given for blood pressure support in the      patient who was septic and had hemodynamic collapse.  The patient      has been able to maintain her blood pressures despite the weaning      of the prednisone.  2. Diabetes type 2 with hypoglycemia.  The patient had been on      multiple medications including Janumet, Avandia, Lantus for her      diabetes.  She came in with hypoglycemia.  I would propose that the      hypoglycemia was multifactorial, owing to the amount of medications      and the patient's state of sepsis.  The medications have been      modified and now that the patient is on prednisone her blood sugars      are elevated into the 300s.  At this point. I still did not support      restarting the metformin until the patient can be observed off of      the prednisone.  I would recommend the patient be on a sliding      scale in addition to the Lantus and the  Januvia.  3. Generalized weakness.  The patient has had a long hospitalization      and is now experiencing generalized weakness.  She has been working      with physical therapy, ambulating for small distances with a      walker.  Physical therapy and occupational therapy have both      recommended that the patient undergo a more intensive regimen of      therapy in a skilled nursing facility and the patient is being      transferred to skilled nursing facility today with a goal of      improving her function so that she can return to her prehospital      home setting.  4. Migraine headaches.  The patient did experience migraine headaches      here in the hospital.  She was treated with sumatriptan which      resolved her headaches.  Please note that the patient uses Treximet      at home for her headache and she will be resumed on that on a      p.r.n. basis.  5. In terms of her chronic pain, she is continuing her medications.  6. In terms of morbid obesity, the patient did have nutrition consults      here at the hospital to specifically address her obesity.      Information was given to the patient by the nutritionist regarding      weight loss tips in the context of a patient with diabetes and      multiple medical problems.   FOLLOW UP:  The patient should follow up with her primary care physician  upon leaving the skilled nursing facility.  However, she should be seen  by the supervising physician within 72 hours of arriving at the  facility.   In terms of physical restrictions.  The patient activity is restricted  by her weakness and limits will be set by physical therapy.   Dietary restrictions, the patient should be on a diabetic heart-healthy  diet geared towards weight loss.  I would recommend the patient has a  nutrition consult when she arrives at the skilled nursing facility.   Total time for this discharge, 25 minutes.      Heather Harm, MD   Electronically Signed     MAM/MEDQ  D:  05/24/2008  T:  05/24/2008  Job:  [id number]

## 2010-08-07 NOTE — Discharge Summary (Signed)
NAMEOTHEL, [name] NO.:  [id number]   MEDICAL RECORD NO.:  [id number]          PATIENT TYPE:  INP   LOCATION:  5526                         FACILITY:  [facility]   PHYSICIAN:  Theodosia Paling, MD    DATE OF BIRTH:  1971-03-09   DATE OF ADMISSION:  05/13/2008  DATE OF DISCHARGE:                               DISCHARGE SUMMARY   PRIMARY CARE PHYSICIAN:  The patient is unassigned.   ADMITTING HISTORY:  Please refer to the excellent admission note for  history of present illness.   DISCHARGE DIAGNOSIS:  1. Altered mentation secondary to hypoglycemia.  2. Hypertension.  3. Pneumonia.  4. Morbid obesity.  5. History of diabetes.  6. History of hypertension.  7. History of depression.  8. History of chronic pain syndrome.   Discharge medications to be determined by discharging physician.   HOSPITAL COURSE:  The following issues were addressed during the  hospitalization:  1. Altered mentation.  The patient was initially admitted to the      intensive care unit for altered mentation.  It was felt that most      likely that  since then she is on multiple narcotics that was the      most likely cause of her altered mentation.  As the narcotics were      stopped, her mentation actually improved.  Along with that most      likely contribution was some hyperglycemia as well.  2. Hypotension.  The next morning after admission the patient's blood      pressure dropped to 70s.  She was started on pressors and IV fluids      were given.  The patient was transferred to under [facility] care.  The      patient later on got transferred to from [facility] to [facility] service 3      days ago.  Since then, the patient's blood pressure has been      stable.  3. The patient received initially broad-spectrum antibiotics which      were tailored to IV moxifloxacin at this time.  Blood culture and      urine culture were negative.  Also, she had a recent echocardiogram      done on September 20, 2008 which was essentially negative.  Therefore      echocardiogram was not repeated.  Her chest x-ray was positive for      right infrahilar and right basilar pneumonia.  At this time,      currently the patient is hemodynamically stable and she is on IV      moxifloxacin for community acquired/aspiration pneumonia.  4. Morbid obesity.  The patient is hypoxic.  Most likely it is a      combination of her morbid obesity, obstructive sleep apnea,      restricted chest wall compliance, as well as pneumonia.  The      patient is chronically bed bound secondary to morbid obesity.  At      this time, we are awaiting a skilled nursing facility bed for her  placement.   CONSULTATIONS:  None.   PROCEDURE PERFORMED:  None.   IMAGING PERFORMED:  Chest x-ray done on May 13, 2008 showing stable  cardiomegaly.  No acute abnormality.  Chest x-ray performed on May 14, 2008 showing patchy airspace disease in right lung.  Chest x-ray  performed on May 16, 2008 showing persistent patchy opacities in  right infrahilar and right basilar area.  CT scan performed on abdomen  and pelvis on May 20, 2008 showing right lower lobe atelectasis  plus/minus infection, along with the right greater than left perinephric  stranding without acute obstructive uropathy suggestive of  pyelonephritis.   DISPOSITION:  At this time, patient is awaiting a skilled nursing  facility bed.   Total time spent in this interim discharge summary 45 minutes.      Theodosia Paling, MD  Electronically Signed     NP/MEDQ  D:  05/21/2008  T:  05/21/2008  Job:  [id number]

## 2010-08-10 NOTE — Consult Note (Signed)
NAMERAMON, BRANT                       ACCOUNT NO.:  [id number]   MEDICAL RECORD NO.:  [id number]                   PATIENT TYPE:  INP   LOCATION:  2007                                 FACILITY:  [facility]   PHYSICIAN:  Bernette Redbird, M.D.                DATE OF BIRTH:  [birth year]   DATE OF CONSULTATION:  10/19/2003  DATE OF DISCHARGE:                                   CONSULTATION   HISTORY OF PRESENT ILLNESS:  Dr. Fannie Knee asked me to see this 40-year-  old, morbidly obese female because of anemia.   Selena Batten was seen by me consultatively in the office about 10 days ago because of  history of possibly bringing up some blood.  On further questioning, it  appeared that this was really coughing up blood.  No retching up or vomiting  up or regurgitating blood, so it appears to be hemoptysis rather than  hematemesis.  Accordingly, she was referred to Dr. Fannie Knee for  evaluation.  Along the way, she had problems with shortness of breath and  fever, and was thus admitted by him to the hospital.   Amongst other things, she was noted to be anemic with hemoglobin of 8.6 (MCV  77, RDW elevated at 17).  She also has a history of some minimal  hematochezia and a history of NSAID exposure.  So for all these reasons, my  input was requested.   PAST MEDICAL HISTORY:  Intolerance to sulfa and penicillin.   OUTPATIENT MEDICATIONS:  Outpatient medications are numerous and include:  1. Avandia.  2. Glucotrol.  3. High dose Duragesic patches.  4. Percocet.  5. Xanax.  6. Iron.  7. Lotrel.  8. Lozol.  9. Glucophage.  10.      Lipitor.  11.      Nexium.  12.      Ibuprofen.  13.      Senokot.  14.      Ambien.   OPERATIONS:  No history of previous abdominal surgery other than  laparoscopy.   PAST MEDICAL HISTORY:  1. Chronic back pain including multiple lumbar surgeries.  2. Type 2 diabetes.  3. Hypertension.  4. No know cardiopulmonary disease.   PHYSICAL EXAMINATION:  I did  not examine him today, but her exam in the  office the other day, limited by virtue of the fact she was in a wheelchair  due to a recent ankle fracture was grossly unrevealing.  However, she has  not had a rectal exam or stool hemoccult as of this time.   IMPRESSION:  1. Possible history of hemoptysis or coffee-ground regurgitation or emesis.  2. History of NSAID exposure.  3. Microcytic anemia.  4. History of minimal hematochezia.   DISCUSSION/PLAN:  My overall index is suspicion that this patient is  harboring active GI diseases relatively low.  However, since she is on  Lovenox while in the hospital and has  history of NSAID exposure and anemia,  I think evaluation of the upper GI tract would be prudent.  The patient was  offered options of expectant management, an upper GI series or endoscopic  evaluation.  Given her history of outpatient usage of outpatient usage of  multiple psychoactive and analgesic medications, I think she would be very  difficult to sedate and would be also risky to sedate in view of her morbid  obesity.  Accordingly, endoscopy, if performed, would probably have to be  unsedated but could be done with a small adult scope that would probably be  fairly comfortable.  Therefore, this option was discussed in some detail  with the patient and her fiance.  After further discussion, however, we  decided to start with an upper GI series which we think would be sufficient  to rule out clinically significant upper tract disease.  I will also obtain  iron studies to confirm whether or not the anemia is of iron deficiency  origin, and increase the patient's Protonix, empirically, to twice daily  dosing while in house.  She should have sigmoid scopic evaluation in view of  her history of minimal hematochezia, and this is already scheduled on an  outpatient basis.                                               Bernette Redbird, M.D.    RB/MEDQ  D:  10/19/2003  T:   10/19/2003  Job:  [id number]   cc:   Reather Littler, M.D.  [address], [address]  [place]  [place] [address]  Fax: [phone]   Clinton D. Young, M.D.  [address] [place]  [place] [address]  Fax: [phone]

## 2010-08-10 NOTE — Discharge Summary (Signed)
NAMESEJAL, COFIELD                       ACCOUNT NO.:  [id number]   MEDICAL RECORD NO.:  [id number]                   PATIENT TYPE:  INP   LOCATION:  5005                                 FACILITY:  [facility]   PHYSICIAN:  Clinton D. Maple Hudson, M.D.              DATE OF BIRTH:  1970/04/02   DATE OF ADMISSION:  10/17/2003  DATE OF DISCHARGE:  10/25/2003                                 DISCHARGE SUMMARY   DISCHARGE DIAGNOSES:  1.  Viral syndrome.  2.  Hemoptysis.  3.  Hematochezia.  4.  Right upper quadrant pain.  5.  Fever of uncertain origin.  6.  Cellulitis/panniculitis.  7.  Leukopenia.  8.  Microcytic anemia with iron deficiency.  9.  Morbid obesity.  10. Depression.  11. Healing right ankle fracture.  12. Essential hypertension.  13. 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  14. 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  15. Elevated D-dimer assay, unexplained.  16. Diabetes type 2, adult-onset, non-insulin dependent.   HISTORY OF PRESENT ILLNESS:  This is a 40 year old, white female followed by  Dr. Lucianne Muss for primary care and admitted with a chief complaint of fever,  hemoptysis and right upper quadrant pain.  She had been treated previously  by Dr. Gerrit Heck, orthopedic surgeon in [place], for an ankle fracture.  She  had been living in a motel after mold remediation in her home, the  significance of which for her further health problems was never  substantiated.  She describes waking from lying on a sofa to cough up black  water and some red clots and indicated that she has had blood from sputum  off and on for several months and that she had also had some blood in  stools.  She complained of right upper quadrant discomfort for several weeks  with past history of cholecystectomy and she reported fever to a temperature  maximum of 105 degrees without chills or sweats.  Outpatient workup had been  ordered, but she called back complaining of continuing  fever and was  admitted.   PAST MEDICAL HISTORY:  1.  Traumatic fracture of right ankle, casted.  2.  Lumbar disc surgery for degenerative joint disease.  3.  Chronic narcotic dependence for chronic back pain.  4.  Depression with confinement to home.  5.  Foot drop, right foot, which she attributed to her back surgery.  6.  Dental extractions.  7.  Pneumonia, age 40.  8.  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  9.  Urologic laparoscopy for hematuria.  10. Diabetes, type 2 treated earlier with insulin and currently with oral      agents.  11. Essential hypertension.   PHYSICAL EXAMINATION:  VITAL SIGNS:  Temperature 101, morbid obesity with  weight 392 pounds.  GENERAL:  No rash or adenopathy.  HEENT:  There were small aphthous ulcers on her tongue.  CHEST:  Breath sounds were shallow, clear and unlabored.  HEART:  Heart sounds were normal.  ABDOMEN:  Minimal tenderness was questioned in the right upper quadrant  without palpable organomegaly.  EXTREMITIES:  Right ankle was casted.   LABORATORY DATA AND X-RAY FINDINGS:  Admission labs were significant for a  hemoglobin of 8.5, WBC 3100 in the face of reported fever, platelet count  133,000.  A D-dimer of 7.58.  Sedimentation rate of 52.  Glucose 199.  INR  1.1.  Outpatient blood work done in the week prior to admission, had  included negative rast intravenous testing for allergen sensitization  including to molds.  Those papers do not appear to be obtained in the  hospital record at the time of dictation.  Total IgE was unremarkable at 77.  White blood count 3100, hemoglobin 8.4 with MCV 84, MCHC 29, RDW 16,  platelet count 144,000; eos were absent.   HOSPITAL COURSE:  Evaluation and management was extremely difficult for all  involved because of her morbid obesity which challenged the capacity of  diagnostic testing equipment.  She reported eating only one meal daily, but  was noted to have a store  of cookies in her bedside table.  Dr. Lucianne Muss  followed her for internal medicine and help with glucose management.  There  were concerns about anemia, relatively low white blood count in the face of  recent fever and suggestion of hepatosplenomegaly on abdominal CT scan.  Dr.  Truett Perna saw her for hematology with the impression this was an iron  deficiency anemia.  Imaging of her chest revealed no abnormalities to  explain a complaint of hemoptysis and initial impression was that she had  been using analgesics for pain control, had developed a gastritis, and the  black water that she described was actually some coffee-grounds  hematemesis.  This could not be proven.  Dr. Matthias Hughs saw her for GI  evaluation, but found no bleeding source.  She did subsequently produce at  least one sputum sample for evaluation which contained some red blood  without clots.  Otherwise, there was nothing purulent and her fever resolved  within her first hospital day, unexplained.  Imaging demonstrated a  panniculitis in the abdominal wall near her umbilicus and it was unclear if  this could have been the fever source or if she had an incidental viral  illness which resolved spontaneously.  Empiric antibiotic coverage for  abdominal wall cellulitis/panniculitis was discussed.  We considered the  possibility of fatty liver/steatohepatitis.  No clotting abnormality was  discovered to explain her elevated D-dimer which settled gradually.  She was  maintained on DVT prophylaxis.  She had some nonspecific sore areas, mildly  at the right upper quadrant a little more pronounced at the left lower  anterior costal margin with no specific abnormality found to explain this.  She was toxic and required supplemental oxygenation attributed to obesity  with hypoventilation.  She was seen by physical therapy who contacted Dr.  Golda Acre office to discuss transition to outpatient physical therapy at discharge.  The case managers  were involved attempting to set up outpatient  followup including home nursing and social service.  Ultimately, she was  discharged back to her hotel with an unsatisfactory lack of clear unifying  explanation for her objective findings.  It remains speculated that she had  a viral illness causing fever, perhaps contributed to by a cellulitis around  her umbilicus, although this was not evident on external exam, just on x-  ray.  She may be having intermittent bleeding in several ears, but we could  not confirm this except to see a small amount of blood in expectorated,  watery, clear sputum.   PROCEDURES:  1.  Upper endoscopy showed no source of anemia.  2.  Flexible sigmoidoscopy was normal to mid colon with no source of rectal      bleeding.  3.  Leg vein Dopplers were negative or technically difficult due to body      habitus.  4.  Transthoracic echocardiogram showed normal overall left ventricular      systolic function with left ventricular ejection fraction estimated at      60-65% and no left ventricular regional wall motion abnormalities.      Diastolic function parameters were normal.  There was no evidence of      pulmonary hypertension.  Right-sided chamber sizes were normal.      Significant valvular abnormality was not seen.  5.  Electrocardiogram showed normal sinus rhythm with low voltage QRS.      Could not rule out anterior or inferior infarcts because of voltage.      This was stable on repeat.  6.  Computed tomography scan of chest to rule out pulmonary embolism was      negative, although somewhat limited by her size.  There was linear      atelectasis or scar in the left lower lobe and a rounded 9 mm density in      the left upper lobe, likely granuloma with no adenopathy or pleural      fluid.  7.  Computed tomography of the abdomen with contrast was originally read as      showing unremarkable liver and spleen, but on review by radiologist, she      was felt to  have definite nonspecific hepatosplenomegaly.  8.  Status post cholecystectomy with normal kidneys, adrenals and pancreas.      Basically negative study.  Review also noted evidence of periumbilical      cellulitis of the fat.  9.  Computed tomography of the pelvis was negative and unremarkable.  10. Computed tomography of the sinuses was negative except for normal      variant hypoplastic, right frontal sinus.  11. Plain films of right ribs was negative for any abnormality to explain      right rib pain and her pain was subsequently considered musculoskeletal      associated with lying in bed.  12. A ventilation perfusion lung scan was normal.   DISCHARGE LABORATORY DATA AND X-RAY FINDINGS:  Admission room air ABG with  pH 7.46, pCO2 37, pO2 57, bicarb 26.  On repeat of July 28, pO2 was 49 and  she was treated with supplemental oxygen.  Admission WBC 3300, rose to 3600,  hemoglobin 8600, rose to 8900.  MCVs were a little low and platelet counts were normal.  Leukocyte differentials were unremarkable with negative  eosinophils.  Stool for occult blood was negative x2.  Reticulocyte count  was 4.5, elevated.  Sedimentation rates were 35 and 21, falling together  with her fever.  Protime/INR 1.2 with D-dimer 6.32 as of July 25; 3.57 on  July 28; 2.93 on August 1.  Glucose 145, 123.  Albumin 2.8 and 2.7.  BUN 6  and 10, creatinine 0.6 and 0.5.  Liver enzymes were normal.  Hemoglobin A1C  elevated at 6.8 on July 25.  B-type natriuretic peptide was 90.  Serum iron  28,  total iron binding capacity 287, percent saturation low at 10%.  B12  level 368, RBC 295, ferritin 49.  Urinalysis unremarkable with no sugar or  significant sediment.  Sputum culture was reincubated, but negative for  pathogens.  AFB smear was negative.  Fungus smear was negative.  ANA was  negative.  ANCA was negative with MPO positive.  Sputum smear showed no  malignant cells.  After initial temperature on admission of 101.7,  she was  afebrile through the rest of her stay.   SPECIAL INSTRUCTIONS:  Oxygen at 2 L continuous and portable for morbid  obesity with obesity hypoventilation syndrome causing hypoxia.   DISCHARGE MEDICATIONS:  1.  Keflex 250 mg q.i.d. x7 days to address any cellulitis in the abdominal      wall.  2.  FeSO4 325 q.i.d.  3.  Os-Cal D 500 mg b.i.d.  4.  Lotrel 5/20 one daily.  5.  Zocor 40 mg daily.  6.  Neurontin 300 mg x2 t.i.d.  7.  Glucotrol XL 10 mg q.d.  8.  Avandia 8 mg q.d.  9.  Duragesic 100 mcg per hour patch wearing two every 3 days.  10. Protonix 40 mg q.d.  11. Cymbalta 60 mg q.d.  12. Oxycodone with APAP 10/325 one or two every four hours p.r.n.  13. Alprazolam 0.25 mg q.4h. p.r.n.  14. Estazolam 2 mg at h.s. p.r.n. sleep.   FOLLOW UP:  She is to call Dr. Lucianne Muss for routine medications and refills and  for follow-up appointment.  She will see Dr. Gerrit Heck for pain management and  make follow-up appointments with her doctors to be seen in the next few  weeks.                                                Clinton D. Maple Hudson, M.D.    CDY/MEDQ  D:  11/30/2003  T:  11/30/2003  Job:  [id number]   cc:   Reather Littler, M.D.  [address]  [place]  [place] [address]  Fax: [phone]   Leighton Roach. Truett Perna, M.D.  [address] [facility]  [place]  [place] [address]  Fax: [phone]   Bernette Redbird, M.D.  [address]  [place], [place] [address]  Fax: [phone]   Gerrit Heck, M.D.  Orthopedic Surgery/[place], [place]

## 2010-08-10 NOTE — Op Note (Signed)
[name], [name]                       ACCOUNT NO.:  [id number]   MEDICAL RECORD NO.:  [id number]                   PATIENT TYPE:  INP   LOCATION:  5005                                 FACILITY:  [facility]   PHYSICIAN:  Bernette Redbird, M.D.                DATE OF BIRTH:  [birth year]   DATE OF PROCEDURE:  10/24/2003  DATE OF DISCHARGE:                                 OPERATIVE REPORT   PROCEDURE:  Flexible sigmoidoscopy.   INDICATIONS:  This is a 40 year old female with heme-negative stool but  possible iron-deficiency anemia (hemoglobin stable at 8.6, MCV 78, iron  saturation 10%, ferritin normal), amenorrheic, and without any source of  anemia endoscopically evident on upper endoscopy.   FINDINGS:  Normal exam to the mid-region of the colon.   PROCEDURE:  The nature, purpose, and risks of the procedure had been  discussed with the patient, who provided written consent.  She was brought  from her hospital room following a full colonoscopy prep over several days,  including milk of magnesia, magnesium citrate, and NuLytely.   Because of the patient's morbidly obese body habitus and the fact that she  is on chronic narcotic analgesics, intravenous sedation was not thought to  be either safe or likely to be effective, so I obtained permission from the  patient to try the exam without sedation, with the understanding we would go  as far as we could.   The Olympus adjustable-tension adult video colonoscope was inserted and  advanced quite easily around the colon, albeit with a fair amount of  subjective discomfort on the part of the patient.  I reached what I believe  may have been the mid-transverse colon (approximately 65 cm of scope  inserted at that point), at which time advancement became a little bit more  difficult and the patient asked me to stop.  By prior agreement, I did stop  and pullback was then initiated.   The quality of the prep was very good, and it is felt that  all areas up to  the limit of the exam were adequately seen.  No polyps, cancer, colitis,  vascular malformations, or diverticular disease were observed, and  retroflexion in the rectum and reinspection was unremarkable.  No biopsies  were obtained.   IMPRESSION:  Normal (limited) examination of the colon, without source of  reported small-volume hematochezia identified (569.3).   PLAN:  Clinical follow-up through the patient's primary physician.  The  question would arise whether or not evaluation of the proximal colon is  necessary.  Given her young age and the recurrently Hemoccult-negative  stool, I think it is very unlikely that she is harboring proximal colonic  pathology to account for her anemia.  To do a barium  enema on this patient would be quite difficult due to mobility  considerations, and to do colonoscopy under general anesthesia, which would  probably be required if we wanted  a complete colonoscopic exam, would have  its own significant risks.  Accordingly, I think a good case can be made for  expectant management from here on.                                               Bernette Redbird, M.D.    RB/MEDQ  D:  10/24/2003  T:  10/24/2003  Job:  [id number]   cc:   Joni Fears D. Young, M.D.  [address] [place]  [place] [address]  Fax: [phone]   Reather Littler, M.D.  [address]  [place]  [place] [address]  Fax: [phone]

## 2010-08-10 NOTE — Consult Note (Signed)
NAMELUN, [name]                       ACCOUNT NO.:  [id number]   MEDICAL RECORD NO.:  [id number]                   PATIENT TYPE:  INP   LOCATION:  0443                                 FACILITY:  [facility]   PHYSICIAN:  Alfonse Alpers. Dagoberto Ligas, M.D.             DATE OF BIRTH:  [birth year]   DATE OF CONSULTATION:  DATE OF DISCHARGE:                                   CONSULTATION   HISTORY:  This is a 40 year old woman who is admitted to the hospital with  multiple complaints including hemoptysis, increasing shortness of breath,  fatigue and cough.  The patient has a  history of having a house that has  had an exposure to mold and moisture.  She apparently also developed  symptoms of cough and hemoptysis and was admitted to the hospital in July of  2005. At that time, the discharge diagnosis was considered to be a viral  syndrome with hemoptysis and hematochezia. Evaluation was quite extensive at  that time. She also had a healing fracture at that time and hypertension.  She has a history of diabetes mellitus which has been present for the last  18 years and this is considered type 2 diabetes and recently has not been  taking insulin. She had been taking insulin previously but her insulin was  discontinued and she was started on Avandia and also Glucotrol XL.  Her  glucoses have been in the upper 100's and 200 range as it is right now in  the hospital.   The patient has noted a peculiar rash which has been present in her lower  abdomen also.  No history of pain at that area.   PAST MEDICAL HISTORY:  She apparently claims that she has been in good  health prior to her events which started during the last several months. She  was admitted to the hospital for back pain. She also denies previous  hospitalizations prior to that.   MEDICATIONS PRIOR TO THIS ADMISSION:  Multiple medications including:  Lotrel, Zocor, Neurontin, Glucotrol XL. The  1.  Lotrel is 5/20.  2.  Zocor 40 mg q.d.  3.  Neurontin 300 mg, 2 three times a day.  4.  Glucotrol XL 10 mg q.d.  5.  Avandia 8 mg q.d.  6.  Duragesic patches.  7.  Protonix.  8.  Cymbalta 60 mg q.d.   PERSONAL HISTORY:  She does not smoke or drink excessive amounts of alcohol.  No history of allergies to medications, however, she does state that she  thinks she is beginning to get allergic to eggs.   PHYSICAL EXAMINATION:  GENERAL:  Reveals a well-developed, massively obese  woman lying in bed who does not appear to be short of breath.  SKIN:  Shows an area of blanching erythema which is from the umbilicus down.  No apparent cellulitis is present. This blanches and does not appear to be  infection.  HEENT:  Her head is normocephalic.  NECK:  Supple. The thyroid is not enlarged; however, palpation is limited by  her massive obesity.  BREASTS:  Show no masses to be present.  CARDIOVASCULAR:  Rhythm is regular.  LUNGS:  Appear to be clear.  ABDOMEN:  Diffusely obese and difficult to palpate but no masses are  present.   IMPRESSION:  1.  Diabetes mellitus type 2.  2.  History of mild pancytopenia with undetermined etiology.  3.  History of cough and hemoptysis and dyspnea, etiology to be determined.  4.  History of hypertension.                                               Alfonse Alpers. Dagoberto Ligas, M.D.    CGG/MEDQ  D:  12/02/2003  T:  12/03/2003  Job:  [id number]

## 2010-08-10 NOTE — Consult Note (Signed)
[name], [name]                       ACCOUNT NO.:  [id number]   MEDICAL RECORD NO.:  [id number]                   PATIENT TYPE:  INP   LOCATION:  0443                                 FACILITY:  [facility]   PHYSICIAN:  Almedia Balls. Ranell Patrick, M.D.              DATE OF BIRTH:  [birth year]   DATE OF CONSULTATION:  12/05/2003  DATE OF DISCHARGE:                                   CONSULTATION   HISTORY OF PRESENT ILLNESS:  [name] is a 40 year old morbidly obese  female who presents to the orthopedic service with a history of right ankle  pain and instability for the last several days.  The patient was admitted on  December 01, 2003, and is in Dr. Kalman Drape service.  The patient was  admitted with anemia and thrombocytopenia, hypoxia, diabetes, and chronic  pain syndrome.  The patient reports having a history of prior right ankle  fracture.  The patient also reports foot drop subsequent to 2 spinal  surgeries.  The patient normally wears an AFO and sees an orthopedic surgeon  in [place], who has told her that he can do nothing for her ankle  fracture.  The patient reports chronic pain with her ankle giving way with  weightbearing.  Denies any new injury.  The patient normally wears an AFO,  but states she cannot wear it secondary to swelling in her foot.   PAST MEDICAL HISTORY:  1.  As mentioned for right ankle fracture.  2.  Lumbar disk surgery for degenerative arthritis.  3.  The patient has a history of chronic narcotic dependency for pain.  4.  Depression.  5.  Right foot drop, as mentioned.  6.  Diabetes.  7.  Hypertension.  8.  Morbid obesity.   MEDICATIONS:  Please see MAR.   SOCIAL HISTORY:  The patient denies smoking or alcohol abuse.  The patient  is living in a hotel currently.   ALLERGIES:  No known drug allergies.   PHYSICAL EXAMINATION:  GENERAL:  The patient is an obese female who is  recumbent in bed, laying on her left side.  EXTREMITIES:  The patient has  an obvious foot drop, right lower extremity,  with absent __________ function.  She has decent posterior tibial and  gastrosoleus function.  Knee range of motion is pain free.  Ankle range of  motion is pain free.  She has no ankle effusion.  No tenderness over the  medial or lateral malleoli.  Upper extremity examination deferred.  SKIN:  Intact.  NEUROLOGIC:  She is grossly neurologically intact, with the except of  lateral calf numbness.  Her left ankle has full range of motion.  She has  good motor strength and dorsiflexion and plantar flexion.   Radiographs examined.  AP, lateral, and mortis of the ankle dated December 05, 2003 demonstrating no obvious fracture and a preserved mortis with no  arthritis.   IMPRESSION:  Right ankle pain and instability and right foot drop.   PLAN:  Discussed with [name] that we need to get her an AFO that fits.  I have recommended that we have another one manufactured by [employer] that she  can put into her shoes and will accommodate her swelling.  We can also work  on swelling with a custom compression stocking.  I would recommend physical  therapy consult for assistance with mobility training, gait training,  weightbearing as tolerated on her right ankle in an AFO.  I would be happy  to follow along with her until she is discharged.                                               Almedia Balls. Ranell Patrick, M.D.    SRN/MEDQ  D:  12/05/2003  T:  12/05/2003  Job:  [id number]   cc:   Jillyn Hidden B. Truett Perna, M.D.  [address] [facility]  [place]  [place] [address]  Fax: [phone]

## 2010-08-10 NOTE — Op Note (Signed)
[name], [name]                       ACCOUNT NO.:  [id number]   MEDICAL RECORD NO.:  [id number]                   PATIENT TYPE:  INP   LOCATION:  5005                                 FACILITY:  [facility]   PHYSICIAN:  Bernette Redbird, M.D.                DATE OF BIRTH:  1970-06-10   DATE OF PROCEDURE:  10/24/2003  DATE OF DISCHARGE:                                 OPERATIVE REPORT   PROCEDURE:  Upper endoscopy.   INDICATIONS:  This is a 40 year old morbidly obese female who has a history  of spitting up small amounts of mucoid blood, which she thinks is actually  coughed up and expectorated rather than retched up from the GI tract.  However, she does have microcytic anemia with low iron saturation (albeit  normal ferritin), raising the question of GI tract blood loss leading to  iron-deficiency anemia.  On the other hand, 2/2 stool studies in the  hospital have been negative for occult blood.  The patient's admission  hemoglobin was 8.6, and it has remained fairly steady since that time over  the ensuing week or so.  Iron saturation was 10% with ferritin in the normal  range.   FINDINGS:  Normal exam.   PROCEDURE:  The nature, purpose, and risks of the procedure had been  discussed with the patient, who provided written consent.  She was brought  from her hospital room to the endoscopy unit.   In view of the patient's morbidly obese body habitus and the fact that she  is on chronic narcotic analgesics, intravenous sedation was felt unlikely to  be either safe or effective, so I obtained consent from the patient to do  the procedure without sedation.  Topical pharyngeal anesthesia with  Cetacaine spray was administered and then we used the small-caliber adult  video endoscope, Olympus model GIF-160, which was passed under direct  vision, entering the esophagus easily.  The vocal cords looked normal.  The  esophagus was similarly normal, without evidence of reflux  esophagitis,  Barrett's esophagus, varices, infection, or neoplasia, including the  proximal esophageal region.  No ring, stricture, or hiatal hernia could be  appreciated.  The stomach contained a small bilious residual but was free of  gastritis, erosions, ulcers, polyps, or masses, and a retroflexed view of  the proximal stomach was unremarkable.   The pylorus, duodenal bulb, and second duodenum looked normal.   The scope was removed from the patient.  She tolerated the procedure well,  and there were no apparent complications.  No biopsies were obtained.   IMPRESSION:  1. Anemia, possibly due to iron deficiency, without source evident on this     exam (285.9).  2. History of expectoration of small amounts of mucoid blood of     indeterminate origin, without source evident on this exam.   PLAN:  Proceed to attempt at colonoscopy.  Bernette Redbird, M.D.    RB/MEDQ  D:  10/24/2003  T:  10/24/2003  Job:  [id number]   cc:   Joni Fears D. Young, M.D.  [address] [place]  [place] [address]  Fax: [phone]   Reather Littler, M.D.  [address], [address]  [place]  [place] [address]  Fax: [phone]

## 2010-08-10 NOTE — H&P (Signed)
[name], [name]                       ACCOUNT NO.:  [id number]   MEDICAL RECORD NO.:  [id number]                   PATIENT TYPE:  INP   LOCATION:  2007                                 FACILITY:  [facility]   PHYSICIAN:  Clinton D. Maple Hudson, M.D.              DATE OF BIRTH:  1971-03-08   DATE OF ADMISSION:  10/17/2003  DATE OF DISCHARGE:                                HISTORY & PHYSICAL   ADMISSION DIAGNOSES:  1. Fever.  2. Hemoptysis.  3. Hematochezia.  4. Right upper quadrant pain.  5. Chronic narcotic analgesic dependence.  6. Complaint of toxic mold exposure.  7. Elevated D-dimer.  8. Morbid obesity.  9. Degenerative disk disease.   HISTORY OF PRESENT ILLNESS:  This is a 40 year old white female nonsmoker  admitted with a chief complaint of fever.  I had seen her on July 22nd,  initially on referral from Dr. Lucianne Muss with a history of mold exposure and  hemostasis.  The history has been difficult.  In November, 2004, she had a  clothes dryer in her home worked on.  Somehow this repair disrupted the  flooring, and the dryer was left exhausting into the home.  She noted mold  in the home and said, An EPA guy from the government told her dryer had  raised the humidity and heat, causing mildew in the home.  She says there  was an obvious mildew odor.  While that issue is being resolved, she and her  fiance are living in a motel.  He says the smells jumps from person to  person and to the truck and was in their clothes, despite the heavy use of  Clorox.  Within a week or so after the clothes dryer repair, she awoke one  night from sleeping on the recliner, coughing up black water.  Since then  has noted intermittent blood and sputum and some blood in the stools.  It is  not definite from her history that the blood is coming from the lung as  opposed to the GI tract.  She had an initial evaluation with Dr. Matthias Hughs and  was scheduled for an endoscopy procedure later this week.   She  has had right upper quadrant discomfort for several weeks.  There is a  history of cholecystectomy years ago.  She associates fever, which she says  she has reported to a maximum 2-3 days ago of 100.5, but no chills or  sweats.  The fever is not a primary complaint.  When I saw her on July 22nd,  I had ordered a CT of the chest, abdomen, and blood work.  She arranged for  the radiology studies to be done at [facility] for access to a larger scanner, so  I did not realize that these procedures were deferred for that scheduling.  She called me today complaining of persistent fever and is brought in for  admission.   REVIEW OF  SYSTEMS:  Occasional headaches.  Aphthous ulcers on the tongue.  No clear seasonal allergic rhinitis or conjunctivitis.  No history of asthma  but she has had perennial nasal congestion.  Sputum with intermittent blood  but no purulence, by her description.  Fever was not helped by a trial of  amoxicillin some indefinite time ago.  She questioned a transient cervical  lymph node but has found no others.  Has had no rash.  Chronic diffuse joint  pain reflecting previous lumbar surgeries as well as tenderness of the right  shoulder and right upper quadrant, for which she is taking very high doses  of narcotic analgesics on a chronic basis.  She denies nausea, vomiting, or  diarrhea.  Says that she tends towards constipation.  Weight gain has been  chronic.  No dysphagia.  She has had amenorrhea for two years.  Feels  depressed.  No energy.   PAST MEDICAL HISTORY:  She tells of a recently fractured right ankle, which  is casted.  Multiple lumbar surgeries for degenerative disk disease.  On  chronic high-dose narcotic from her orthopedist, Dr. Gerrit Heck.  Right foot  drop after her back surgery.  Dental extractions.  Pneumonia at age 54.  Brain surgery for premature fontanelle closure.  Urologic laparoscopy for  hematuria.  Diabetes type 2, adult onset.  Had dropped off insulin and  is  being managed with oral agents.  Essential hypertension.  She denies a  history of tuberculosis, DVT/PE, cancer, heart disease, or liver disease.   SOCIAL HISTORY:  Never smoked.  Lives with fiance.  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   FAMILY HISTORY:  No children.  Father had aspiration pneumonia and may have  had a blood clot disease.  Mother is living with a history of heart disease,  diabetes, and stroke.   MEDICATIONS:  1. Avandia.  2. Glucotrol XL.  3. Duragesic using 100 mcg per hour patches, two every 3 days.  4. Oxycodone/APAP 10/325 1-2 q.4h.  5. Carisoprodol 350 mg q.6h. p.r.n.  6. Alprazolam 1 mg q.4h. p.r.n.  7. Calcium 1200 mg.  8. Iron OTC.  9. Lotrel 5/20.  10.      Lozol 2.5.  11.      Glucophage 1250 mg.  12.      Lipitor 20 mg.  13.      Nexium 40 mg.  14.      Ibuprofen 800 mg q.6h. p.r.n.  15.      Senokot.  16.      Miracle mouthwash.  17.      Ambien.   DRUG INTOLERANCE:  1. SULFA.  2. She says that PENICILLIN caused hives, but she can take amoxicillin.   PHYSICAL EXAMINATION:  VITAL SIGNS:  Temperature here is 101.  GENERAL:  She is morbidly obese, alert and cooperative.  SKIN:  No rash found.  HEENT:  Aphthous ulcers on tongue, otherwise oral mucosa looks clear.  Nasal  airway not obstructed.  No stridor or JVD.  NECK:  No adenopathy.  LUNGS:  Shallow, clear breath sounds.  Unlabored without cough or wheeze.  HEART:  Regular rhythm.  No murmur or gallop.  ABDOMEN:  Massively obese.  She may be minimally tender with pressure in the  right upper quadrant.  Difficult exam.  Bowel sounds are faint.  BREASTS/PELVIC/RECTAL:  Not done at this time.  Stool for occult blood is  requested.  EXTREMITIES:  No edema, cords, or clubbing.  The right ankle is casted.   LABORATORY:  Labs from my office on July 22nd include a WBC of 3100,  hemoglobin 8.4, platelet count 144,000.  D-dimer 7.58.  Sed rate 52.  Coags normal.  Electrolytes  unremarkable with a glucose of 199.  So far, blood  cultures from July 22nd are negative x2.   IMPRESSION:  1. Fever, elevated sedimentation rate.  Low-normal with elevated white blood     count on July 22nd.  Challenge issue of infection.  We will reculture and     seek the source.  2. Hemoptysis, by report.  3. Hematochezia by report, for which she has already had a preliminary visit     with Dr. Matthias Hughs.  I wonder if she may have a gastrointestinal source     actually, with hematemesis, rather than hemoptysis, and whether some of     this could be due to excessive use of analgesic medications causing     gastritis.  This may also be the basis for anemia.  Her complaint of     toxic mold exposure seems to be a unifying concept for her, but I am not     sure that it has anything to do with any of this.  She has a chronic     narcotic analgesic dependence related to her history of multiple     orthopedic surgeries. The right upper quadrant pain is a separate issue.     In the absence of a gallbladder, I am concerned about steatohepatitis,     but available liver enzymes are normal.  We will include this area in CT     evaluation.  The elevated D-dimer is nonspecific, but certainly of     concern.  I cannot exclude pulmonary embolism as part of her complaint     profile, but I do not get a history suggesting a specific acute event.     We will begin collecting lab evaluations.  Hopefully, radiology will be     able to image her adequately.                                                Clinton D. Maple Hudson, M.D.    CDY/MEDQ  D:  10/17/2003  T:  10/17/2003  Job:  [id number]   cc:   Reather Littler, M.D.  [address], [address]  [place]  [place] [address]  Fax: [phone]   Bernette Redbird, M.D.  [address]  [place], [place] [address]  Fax: [phone]

## 2010-08-10 NOTE — Consult Note (Signed)
[name], [name]                       ACCOUNT NO.:  [id number]   MEDICAL RECORD NO.:  [id number]                   PATIENT TYPE:  INP   LOCATION:  0443                                 [facility]ACILITY[facility]  [facility]   PHYSICIAN:  Casimiro Needle B. Sherene Sires, M.D. [facility]           DATE OF BIRTH:  11/10/1970   DATE OF CONSULTATION:  12/02/2003  DATE OF DISCHARGE:                                   CONSULTATION   REFERRING PHYSICIAN:  Jillyn Hidden B. Truett Perna, M.D.   REASON FOR CONSULTATION:  Hemoptysis.   HISTORY OF PRESENT ILLNESS:  This is an exceptionally complicated 40-year-  old white female who states her health has deteriorated over the last six  months, but all started with mold exposure, which apparently ended about  11 months ago.  The story she tells is that she had her dryer repaired by  Rhetta Mura which did not connect the exhaust from the dryer to the outside but  rather put a pair of pantyhose over the outlet, and she continued to  operate the dryer within the house.  Despite being removed from the house 11  months ago, she states her health has deteriorated over the last six months,  characterized by sporadic fever and intermittent hemoptysis as well as  epistaxis and hematochezia that was extensively evaluated during her  admission by Dr. Roxy Cedar service from July 25 to October 25, 2003.  When I  asked her why she did not request Dr. Maple Hudson to see her again, she said,  Because he never looked at my sputum.  (Apparently she is understanding  that if we examined her sputum we could figure out why she was coughing up  blood).  However, looking at Dr. Roxy Cedar discharge summary he did an  exhaustive evaluation of this patient for her problems and documented that  although she did have fever on admission that she defervesced rapidly and  had no definite etiology for the fever other than having evidence of  possible panniculitis by CT scan of the abdomen, and had no evidence of  pulmonary embolism by either CT  or VQ scan and actually had no significant  pulmonary infiltrates.  She did have evidence of mild chronic sinus changes  and continued at home to have low grade epistaxis and hemoptysis with  dyspnea and choking when she would try to lie back in bed.  She finds the  best position for her is at 30 degrees upright.  She describes coughing up  at most about a half a cup of blood per day, but for the last week or so,  has actually coughed less than this but continues also to have epistaxis.  She was discharged on oxygen after it was documented that she did have  desaturation which was felt to be related to obesity with poor VC match in  the bases but without significant hypercarbia.  She was discharged on 2 L  nasal prongs.  She  is readmitted now for reported recurrent fever and  failure to thrive by Dr. Truett Perna with pancytopenia for bone marrow  evaluation.  She denies any pleuritic pain.  She is comfortable at rest at  30 degrees upright, but begins to smother and choke when she lies back.   PAST MEDICAL HISTORY:  1.  Significant for traumatic fracture of the right ankle for which she says      her orthopedist in [place] has told her she cannot do anything.  2.  She is also status post lumbar disk surgery due to degenerative      arthritis.  3.  She has a history of chronic narcotic dependency for back pain.  4.  Depression.  5.  Right foot drop that she attributes to previous back surgery.  6.  Diabetes.  7.  Hypertension.   SOCIAL HISTORY:  She is presently living in a hotel.   FAMILY HISTORY:  Negative for clotting disorders.  Negative for lung cancer.   REVIEW OF SYSTEMS:  Taken in detail and essentially negative except as  already outline above.   ALLERGIES:  None known.   DISCHARGE MEDICATIONS:  1.  Keflex.  2.  Iron.  3.  Os-Cal.  4.  Lotrel.  5.  Zocor.  6.  Neurontin.  7.  Glucotrol.  8.  Avandia.  9.  Duragesic.  10. Protonix.  11. Cymbalta.  12.  Oxycodone.  13. Alprazolam S.  Please see exhaustive discharge summary by Dr. Fannie Knee for details.   PHYSICAL EXAMINATION:  GENERAL:  This is an obese white female who appears  quite alert but has difficulty processing questions and usually comes back  to the mold issue whenever a question is asked.  For instance when I asked  her about the pattern of her fever in terms of chronology, I never could get  the chronology straight with her because of all the interjections about her  opinions about mold.  She is in no acute distress.  She does have classic  voice fatigue.  The more she talks, the more hoarse she becomes.  VITAL SIGNS:  She is afebrile, normal vital signs.  HEENT:  Unremarkable.  I do not see any evidence of obvious postnasal  drainage or active epistaxis.  LUNGS:  Lung fields reveal classic pseudowheeze, but otherwise are clear  bilaterally with decreased breath sounds in the bases.  HEART:  Regular rate and rhythm.  No increase in P2.  ABDOMEN:  Obese, but benign.  EXTREMITIES:  Warm, no calf tenderness, cyanosis, clubbing.   LABORATORY DATA:  Hemoglobin saturation is adequate on 2 L by nasal prongs.  Chest x-ray is pending.   Lab studies reviewed with Dr. Truett Perna did indicate pancytopenia.   IMPRESSION:  Persistent low grade epistaxis and hemoptysis in a patient with  pancytopenia and apparent coagulopathy.  I believe her problems are  multifactorial but from a pulmonary perspective, do not see any obvious  etiology for the hemoptysis that she reports other than epistaxis (not mg  that her CT scan was normal during her previous workup).  Also emphasized to  the patient that looking at her sputum is not going to help sort out the  cause of her hemoptysis.  On the other hand, I also emphasized to her that  patients who have active epistaxis are almost always going to have  hemoptysis from bloody postnasal drainage and that we might need to turn our attention to that  issue if it persists,  but I doubt it is the cause of her  anemia (that is, ENT evaluation may be necessary depending on whether Dr.  Truett Perna thinks that the coagulation problem is causing the epistaxis).   For now, I recommend the following specifics, however:  1.  Humidify oxygen and use the lowest flows possible to achieve a      saturation of 90% so that we can minimize the possibility that oxygen is      actually exacerbating her epistaxis and in turn, exacerbating her      hemoptysis.  2.  Check PA and lateral chest x-ray, pending.  However, in the absence of      any new pulmonary symptoms such as increased dyspnea, pleuritic pain.      Would not repeat a CT scan at this point.  3.  Use incentive spirometry to try to help aerate the bases.  I believe      that poor VQ mismatching rather than obesity hyperventilation is the      explanation for her hypoxemia.  This is in turn being exacerbated by the      use of narcotics which can be partially overcome with incentive      spirometry.  4.  Mobilize her as much as possible.  I do not accept that she cannot be      mobilized until October which is what she reports her [facility]      orthopedist told her.  I would like our physical therapy department to      either directly contact him for physical therapy recommendations or have      an orthopedist evaluate her for her fractured foot and make specific      recommendations on what physical therapy and occupational therapy can do      in terms of mobilizing this patient fully.  5.  Stop ACE inhibitors.  She has pseudowheezing on exam and may have enough      upper airway irritability from the ACE inhibitors and reflux to      traumatize the tissues and in the setting of coagulopathy, cause      hemoptysis on this basis, although again I think the problem is mostly      epistaxis, not hemoptysis.  6.  She is at high risk of DVT, pulmonary embolism, but states she cannot      use the  PAS hose, as these were tried on previous evaluation.  Clearly      she cannot use Lovenox either.  Therefore mobilization is definitely      critical in this setting to reduce the risk of DVT/pulmonary embolism.   I gave the patient clear and ambiguous information today regarding how Dr.  Maple Hudson and our group worked together and that I would try to help as much as  possible, focusing on the pulmonary issues, at least in the short run, to  assure that we have optimized her lung function and minimized the  possibility of primary source of hemoptysis.  Long term follow-up will  remain to be sorted out, perhaps by Dr. Lucianne Muss serving as her primary  physician.  However, I do not plan to follow the patient regularly.                                               Charlaine Dalton. Wert,  M.D. [facility]    MBW/MEDQ  D:  12/03/2003  T:  12/03/2003  Job:  [id number]   cc:   Reather Littler, M.D.  [address], [address]  [place]  [place] [address]  Fax: [phone]   Leighton Roach. Truett Perna, M.D. [address] [facility]  [place]  [place] [address]  Fax: [phone]

## 2010-08-10 NOTE — Discharge Summary (Signed)
[name], [name]                       ACCOUNT NO.:  [id number]   MEDICAL RECORD NO.:  [id number]                   PATIENT TYPE:  INP   LOCATION:  0443                                 FACILITY:  [facility]   PHYSICIAN:  Leighton Roach. Truett Perna, M.D.              DATE OF BIRTH:  02/07/1971   DATE OF ADMISSION:  12/01/2003  DATE OF DISCHARGE:  12/09/2003                                 DISCHARGE SUMMARY   DISCHARGE DIAGNOSES:  1.  Iron deficiency anemia confirmed on bone marrow exam.  2.  Thrombocytopenia secondary to #1.  3.  Left upper quadrant pain of unclear etiology; question cyst or lipoma.  4.  Morbid obesity.  5.  Hypoxia secondary to obesity and atelectasis.  6.  Hemoptysis secondary to upper airway irritation related to      gastroesophageal reflux disease and possibly ACE inhibitor.  7.  Recent ankle fracture.  8.  Diabetes mellitus.  9.  Hypertension.  10. Chronic pain syndrome on multiple medications.  11. Lupus anticoagulant.   CONSULTATIONS:  1.  Michael B. Sherene Sires, M.D., pulmonary.  2.  Reather Littler, M.D., endocrinology.  3.  Almedia Balls. Ranell Patrick, M.D.  4.  Physical and occupational therapy.   PROCEDURE:  Bone marrow aspirate and biopsy, December 05, 2003.   HISTORY OF PRESENT ILLNESS:  [name] is a 40 year old woman followed by  Dr. Truett Perna for anemia.  She was seen in the office on December 01, 2003  for routine followup at which time she reported malaise, low grade fever,  anorexia, hemoptysis and dyspnea.  Lab work showed hemoglobin 9.1, MCV 80.9,  white blood cell count 4.2, ANC 2.2 and platelet count 124,000.  Oxygen  saturation on room air was 80%. She was subsequently admitted for further  evaluation of the hypoxia and anemia.   HOSPITAL COURSE:  [name] was admitted to [facility] on  December 01, 2003 for further evaluation of hypoxia and anemia.  Vital signs  on admission showed temperature 99.4, heart rate 101, respirations 24, blood  pressure 122/72, oxygen saturation 86% on 2 liters and 80% on room air.  Pulmonary consultation was obtained. [name] was evaluated by Dr. Sherene Sires.  The hypoxia was felt to be secondary to a combination of obesity and  atelectasis.  She receive bronchodilator therapy during this  hospitalization.  Arrangements were already in place for home O2.  The  hemoptysis was felt to be secondary to upper airway irritation related to  GERD and possibly the ACE inhibitor she was on prior to admission. She was  started on Protonix 40 mg twice daily and the ACE inhibitor was  discontinued. Reglan 10 mg before meals and at bedtime was also initiated.  The hemoptysis improved.  If the hemoptysis worsens, Dr. Sherene Sires plans to do a  fiberoptic bronchoscopy.   Outpatient evaluation of the anemia was consistent with a diagnosis of iron  deficiency.  Ms.  [name] also developed a mild thrombocytopenia and  leukopenia.  Bone marrow biopsy was done on December 05, 2003 by Dr.  Truett Perna with the final report showing markedly decreased iron stores.  The  marrow was noted to be slightly hypercellular with nonspecific changes  including increased numbers of morphologically normal megacaryocytes.  She  was continued on ferrous sulfate 325 mg four times daily which she tolerated  without difficulty.  A followup CBC on December 08, 2003 showed the  hemoglobin to be 9.5 and the platelet count to have normalized.  The source  of blood loss was unclear.  GI evaluation in July including an EGD and  sigmoidoscopy was negative.  We will make a referral for further GI  evaluation as an outpatient.   [name] has a history of diabetes mellitus which was managed by Dr.  Lucianne [name].  Dr. Lucianne [name] followed [name] during this hospitalization and  adjusted the medication regimen as needed.  She was started on NPH and  regular insulin and was continued on Avandia and Glucotrol.  Overall, blood  sugars remained well controlled. She will  continue to followup with Dr.  Lucianne [name] as an outpatient.  Diabetic medications at discharge included NPH  insulin 35 units each  morning and 25 units at supper, Novolog 5 units twice  daily, Avandia 1 tablet daily and Glucotrol 10 mg daily.   Prior to this admission, [name] had sustained an ankle fracture.  She is  followed by an orthopedic physician in [place]. An orthopedic referral  was requested during this admission for recommendations regarding activity  level.  She was evaluated by Dr. Ranell Patrick who recommended obtaining an AFO  which fit.  This was manufactured for her during this admission. Physical  and occupational therapy consults were obtained as well.  Mr. Buchan was  able to ambulate small distances with a walker by discharge.   [name] is on multiple medications related to a chronic pain syndrome.  These medications are managed through her orthopedic physician in  [place].  No changes were made to the pain regimen during this admission.   On December 08, 2003, [name] developed left upper quadrant abdominal  pain. The etiology of this pain was unclear.  Upon examination, she was  noted to be tender over the left upper abdomen with a nodular area palpated  in what appeared to be the subcutaneous fat.  The pain was felt to possibly  be secondary to a cyst or lipoma versus benign musculoskeletal discomfort.   She will call the office if the abdominal pain worsens.   On December 09, 2003, [name] was felt to be stable for discharge home.  She was able to ambulate small distances with physical therapy.  A CBC will  be checked in one week and we will arrange for an office visit in two weeks.  She will call the intern with any problems.   LABORATORY DATA:  December 08, 2003 hemoglobin 9.5, MCV 83.6, white count  4.1, platelet count 178,000.  Retic 5.6%.  Urinalysis negative for blood.   DISPOSITION:  1.  Condition--stable. 2.  Activity--per orthopedics and  physical therapy recommendations.  3.  Diabetic diet.  4.  Wound care--n/a.  5.  Special instructions--Call with increased shortness of breath, bleeding      or any other problems.   FOLLOW UP:  1.  Lab work in one week.  2.  Followup appointment with Dr. Truett Perna in two weeks. The office will  contact her with the appointment times.   DISCHARGE MEDICATIONS:  1.  Ferrous sulfate 325 mg 4 times day.  2.  Neurontin 600 mg 3 times daily.  3.  Cymbalta 60 mg daily.  4.  Soma 350 mg every 6 hours.  5.  Duragesic patch 200 mcg to be changed every 3 days.  6.  Reglan 10 mg before meals and at bedtime.  7.  Zocor 40 mg daily.  8.  Protonix 40 mg daily.  9.  Oxycodone/APAP 10/325, 1-2 every 4 hours as needed.  10. Xanax 1 mg every 4-6 hours as needed.  11. Estazolam 2 mg at bedtime as needed.  12. NPH insulin 35 units q.a.m., 25 units at supper, Novolog insulin 5 units      twice daily, Avandia 8 mg daily and Glucotrol 10 mg daily.      LT/MEDQ  D:  12/09/2003  T:  12/10/2003  Job:  [id number]   cc:   Reather Littler, M.D.  [address]  [place]  [place] [address]  Fax: [phone]   Charlaine Dalton. Sherene Sires, M.D. [facility]

## 2010-08-10 NOTE — Consult Note (Signed)
[name], [name]                       ACCOUNT NO.:  [id number]   MEDICAL RECORD NO.:  [id number]                   PATIENT TYPE:  INP   LOCATION:  5005                                 FACILITY:  [facility]   PHYSICIAN:  Leighton Roach. Truett Perna, M.D.              DATE OF BIRTH:  [birth year]   DATE OF CONSULTATION:  10/20/2003  DATE OF DISCHARGE:                                   CONSULTATION   REFERRING PHYSICIAN:  Reather Littler, M.D.   HISTORY OF PRESENT ILLNESS:  Ms. [name] is a 40 year old, morbidly obese  woman asked to see in consultation for evaluation of anemia.  She was  admitted by [facility] D. Maple Hudson, M.D., on November 17, 2003, for evaluation of  hemoptysis and fever.  Per H&P report, since 2004 the patient had been  exposed to mold.  Since then, she has had intermittent blood in sputum along  with hematochezia for which she was admitted for further evaluation since  the symptoms escalated to having fever up to 100.5 degrees (the patient  states that they are really 105 degrees with some other episodes of fever of  102 degrees for the last 12 days) and what she described as black water,  which may be translated into ground coffee emesis.  She had undergone CTs of  the chest, abdomen, and pelvis prior to admission, which were negative  essentially.  She also had a GI workup.  Blood laboratories came back with a  hemoglobin of 8.6, a hematocrit of 26, an MCV of 77, and negative Hemoccult.  Thus, with the patient being anemic in the setting of all of her other  medical problems, she was  asked to see in consultation.   PAST MEDICAL HISTORY:  1. Anemia.  2. Morbid obesity.  3. Chronic narcotic analgesic dependence.  4. History of toxic mold exposure.  5. DJD.  6. 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  7. Diabetes mellitus.  8. Depression.   SURGERIES/PROCEDURES:  1. Status post several lumbar surgeries for DJD.  2. Status post cholecystectomy.   ALLERGIES:  1. SULFA.  2.  PENICILLIN.  3. AMOXICILLIN.   CURRENT MEDICATIONS:  1. Norvasc 5 mg daily.  2. Lotensin 20 mg daily.  3. Calcium carbonate 500 mg b.i.d.  4. Cymbalta 30 mg daily.  5. Xanax p.r.n.  6. Soma 250 mg q.6h. p.r.n.  7. Lovenox 80 mg daily.  8. Duragesic 200 mcg q.72h.  9. Ferrous sulfate 325 mg daily.  10.      Neurontin 600 mg t.i.d.  11.      Glucotrol 10 mg daily.  12.      Percocet one or two q.4h. p.r.n.  13.      Restoril p.r.n.  14.      Senokot b.i.d.  15.      Lozol 2.5 mg daily.  16.      Nystatin 5 mg q.i.d.  17.  Protonix 40 mg b.i.d.  18.      Avandia 8 mg daily.  19.      Zocor 40 mg q.h.s.  20.      Percocet p.r.n.   REVIEW OF SYSTEMS:  She complains of sweating accompanied by fevers as  described above, chronic sinus tenderness, and fatigue.  No weight loss or  decrease in appetite.  No headaches or blurred vision.  No dyspnea on  exertion.  No chest pain.  She does complain of painful right upper quadrant  pain.  No nausea or vomiting.  She has some constipation.  She also  complains of minimal hematochezia, which she believes is hemorrhoid related.  No dysphagia.  No NSAID exposure.  No dysuria or gross hematuria.  No calf  tenderness.  No dysesthesias or peripheral edema.   FAMILY HISTORY:  Mother alive with a history of CVA and heart disease.  Father deceased of what she states is a blood clot in the setting of  pneumonia.  She also states that he had anemia treated by Dr. Darnelle Catalan.   SOCIAL HISTORY:  The patient is single.  She has no children.  She is on  disability.  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  She never smoked.  No alcohol intake.  Her main caretaker is her fiancee.  She lives in [place], [place], telephone number [phone].  Of note,  for the last four months, she has been living in [facility] while mold  is being cleaned from her house.   PHYSICAL EXAMINATION:  GENERAL APPEARANCE:  This is a morbidly  obese, 40-  year-old, white female in no acute distress.  Alert and oriented x 3.  VITAL SIGNS:  Blood pressure 143/75, pulse 89, respirations 18, temperature  98.8 degrees, pulse oximetry 94 on 3 L.  WEIGHT:  393 pounds.  HEENT:  Normocephalic and atraumatic.  PERRLA.  Oral mucosa with some  aphthous ulcers.  No gum bleeding.  NECK:  Supple.  No JVD.  No cervical or supraclavicular masses.  CHEST:  Symmetrical on inspiration.  LUNGS:  Clear to auscultation.  BREASTS:  Without masses.  CARDIOVASCULAR:  Regular rate and rhythm without murmurs, rubs, or gallops.  ABDOMEN:  Morbidly obese.  There is slight tenderness in the right upper  quadrant, as well as in the right costal diaphragmatic area.  Of note, from  her umbilical area radiates what appears to be panniculitis consisting of a  warm, slightly erythematous area across the abdomen, more pronounced on the  left with subcutaneous edema.  GENITOURINARY:  Deferred.  RECTAL:  Deferred.  EXTREMITIES:  No clubbing or cyanosis.  Slight presence of edema.  No  petechia or purpura.  NEUROLOGIC:  Remarkable for right foot drop post surgery.  She also has some  tenderness in the spinal surgical areas, chronic.   LABORATORIES:  Hemoglobin 8.1, hematocrit 25.4, white count 3.1, platelets  163.  Of note, her hemoglobin on admission were 8.6, dropping to 8.4, and  today 8.1.  Neutrophil count is 1.5.  Sodium 136, potassium 3.6, BUN 10,  creatinine 0.5, glucose 123, total bilirubin 0.9, alkaline phosphatase 71,  AST 27, ALT 13, total protein 6.1, albumin 2.7, and calcium 8.7.  ANA  negative.  Hemoccult negative.  Reticulocyte count pending.  The ESR on October 14, 2003, was 1.  The current ESR is pending.  B12 pending.  Ferritin 49.  D-dimer 3.57.  Iron studies pending.  Blood  cultures negative.  ImmunoCap  negative.  Dopplers negative for DVTs.  CT negative for PE.  Of note, while speaking to [facility] D. Young, M.D., he mentioned that he would  like to  repeat a VQ scan on the patient and also he mentioned that while speaking  with Dr. Frazier Richards from radiology, it is likely that there is some  hepatomegaly and a closer look at her spleen needs to be taken in order to  truly rule out any abnormalities.   ASSESSMENT:  Her blood smear was evaluated by Dr. Truett Perna showing increased  polychromasia, ovalocytes, a few teardrops, target cells, microcytes, marked  size variation, normal white blood count morphology, and normal platelets.   IMPRESSION:  1. Microcytic anemia.  2. Mild leukopenia.  3. Fever.  4. Questionable panniculitis, cellulitis in the low abdominal wall.  5. Diabetes mellitus.  6. Hypoxia.  7. Morbid obesity.  8. Elevated prothrombin time and partial thromboplastin time.  9. Elevated D-dimer.  10.      Hypoalbuminemia.  11.      Questionable hemoptysis versus hematemesis.  12.      Hematochezia.   The hematologic indices and blood smear are most consistent with iron  deficiency with a questionable blood loss.  The differential diagnosis for  the anemia includes chronic disease and less likely a primary hematologic  process.   The leukopenia, low albumin, and elevated coagulation times may reflect  chronic liver disease.  I will review the abdominal CT scan.  She has a low-  grade fever and there is erythema and edema over the abdominal panus,  questionable cellulitis versus panniculitis.   RECOMMENDATIONS:  1. Gastrointestinal:  Workup for source of blood loss.  2. Consider further evaluation for chronic liver disease.  3. Trial of ferrous sulfate q.i.d. after GI evaluation.  4. Obtain outpatient CBC data from Dr. Lucianne Muss.  5. Trial of antibiotics for that abdominal wall possible infection.   Dr. Truett Perna has seen and evaluated the patient.  The chart has been  reviewed and the films have been looked at.  Dr. Truett Perna will continue to  monitor.   Thank you very much for allowing [name] to participate in the  care of Ms.  Throgmorton.     Marlowe Kays, P.A.                        Jillyn Hidden B. Truett Perna, M.D.    SW/MEDQ  D:  10/23/2003  T:  10/23/2003  Job:  [id number]   cc:   Reather Littler, M.D.  [address]  [place]  [place] [address]  Fax: [phone]

## 2010-08-10 NOTE — Discharge Summary (Signed)
[name], DEROCHER NO.:  [id number]   MEDICAL RECORD NO.:  [id number]          PATIENT TYPE:  INP   LOCATION:  0279                         FACILITY:  [facility]   PHYSICIAN:  Leighton Roach. Truett Perna, M.D. DATE OF BIRTH:  Mar 09, 1971   DATE OF ADMISSION:  12/30/2003  DATE OF DISCHARGE:  01/06/2004                                 DISCHARGE SUMMARY   CONDITION AT DISCHARGE:  Improved.   DIAGNOSES:  1.  Admission with increased dyspnea and a high fever secondary to      pneumonia.      1.  Status post course of broad-spectrum antibiotics during this          admission with clinical improvement.  2.  Chronic hypoxia secondary to atelectasis and obesity hypoventilation.  3.  Hypertension.  4.  Diabetes mellitus.  5.  History of depression.  6.  Chronic pain syndrome.  7.  Degenerative disk disease.  8.  History of right foot drop following back surgery.  9.  Status post cholecystectomy.  10. Morbid obesity.   HOSPITAL PROCEDURES:  1.  CT scan of the chest.  2.  Abdominal ultrasound.   HOSPITAL CONSULTANTS:  1.  Dr. Sherene Sires, pulmonary medicine.  2.  Dr. Lucianne Muss, endocrinology.   HOSPITAL COURSE:  Ms. Utley is a 40 year old being followed in the  hematology clinic with a diagnosis of iron-deficiency anemia and  leukopenia/thrombocytopenia.   She presented to the office on December 30, 2003 with a complaint of increased  dyspnea and a fever of greater than 102 degrees at home.  She was admitted  for further evaluation.   She had a fever of 103 degrees on the evening of admission.  She was placed  on broad-spectrum intravenous antibiotic support.  Cultures of the blood and  urine returned negative.   A CT scan of the chest revealed a new infiltrate in the right lung.   The fever resolved over the next few days.   On January 01, 2004, she was transferred to the medical intensive care unit  when she became lethargic and developed a CO2 retention.  This was in part  felt  to be related to polypharmacy in the setting of a morbid obesity and  hypoventilation.   She was taken off of multiple medications including narcotic analgesics and  Neurontin/Soma.   Her clinical status improved over the next few days and she was transferred  to the floor.   She was followed throughout this hospital admission by Dr. Lucianne Muss for  management of diabetes mellitus.   Dr. Sherene Sires was consulted from the pulmonary service.  He recommends  continuation of oxygen and incentive spirometer at home.   Ms. Stang was seen by physical therapy while hospitalized.  She was  ambulating with a walker prior to discharge.   Over the past few days of this admission she remained afebrile and the  oxygen saturations were adequate on nasal cannula oxygen support.   Ms. Berni has a history of pancytopenia of unclear etiology.  A bone marrow  biopsy was nondiagnostic.  There was evidence for iron deficiency  on the  bone marrow.  She will be continued on iron at discharge.   The white count and platelet count remained mildly decreased during this  hospital admission.  This was felt to potentially be related to  hypersplenism.  An abdominal ultrasound confirmed enlargement of the liver  and spleen without other significant findings.  There was no evidence for  portal hypertension on a Doppler flow analysis.   On the morning of January 06, 2004, Ms. Shiffer appeared stable for  discharge.   DISCHARGE MEDICATIONS:  1.  Humulin N 10 units every morning.  2.  Avandia one once daily.  3.  Glucotrol XL one once daily.  4.  Protonix 40 mg once daily.  5.  Reglan 10 mg before every meal.  6.  Ferrous sulfate 325 mg t.i.d.  7.  Avelox 400 mg once daily for 3 days.  8.  Percocet one to two q.4h. p.r.n.  9.  Xanax 0.5 to 1 mg at bedtime p.r.n.  10. She is to resume Soma, Neurontin, and Duragesic as directed by Dr.      Lucianne Muss.  Duragesic will be dosed at a 100 mcg patch every 3 days.   DISCHARGE  INSTRUCTIONS:  She is to return to the outpatient physical therapy  program.  She will call for increased shortness of breath or a recurrent  fever.  Followup care will be with Dr. Truett Perna within the next 1-2 weeks.  She is to call Dr. Remus Blake office for a followup appointment.      GBS/MEDQ  D:  01/06/2004  T:  01/06/2004  Job:  [id number]   cc:   Reather Littler, M.D.  [address]  [place]  [place] [address]  Fax: [phone]   Charlaine Dalton. Sherene Sires, M.D. [facility]
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
## 2010-08-10 NOTE — Consult Note (Signed)
NAMEALEXANDR, OEHLER             ACCOUNT NO.:  [id number]   MEDICAL RECORD NO.:  [id number]          PATIENT TYPE:  INP   LOCATION:  0259                         FACILITY:  [facility]   PHYSICIAN:  Casimiro Needle B. Sherene Sires, M.D. Romualdo Bolk OF BIRTH:  1971/02/22   DATE OF CONSULTATION:  12/30/2003  DATE OF DISCHARGE:                                   CONSULTATION   REQUESTING PHYSICIAN:  Leighton Roach. Truett Perna, M.D.   REASON FOR CONSULTATION:  Chest pain and dyspnea.   HISTORY:  This patient was exhaustively evaluated, both under  hospitalization under Dr. Roxy Cedar service and also from July 25th through  August 2nd, and also again under Dr. Kalman Drape service on December 02, 2003  for recurrent hemoptysis that appeared to be related to a mild  coagulopathy and active epistaxis.  She had vague chest discomfort that was  not clearly pleuritic, but both Dr. Truett Perna and I felt she was a very high  risk of pulmonary embolism and did everything possible to immobilize her  (she stated she could not take the ASO's).  We excluded pulmonary embolism  actively with venous Dopplers and CT scans; however, she remained O2  dependent at the time of discharge, on no pulmonary medicines.  The notes  say that I strongly recommended incentive spirometry, and I ordered it, but  she says, They never gave it to me, and I haven't been taking it.  (She  did not recognize it when I described it to her).  She comes in now with a  four-day history of worsening dyspnea at rest associated with mid abdominal  and mid back pain (it is not lateralizing) that is worse with coughing or  deep breathing.  She reports also increased bloody diarrhea.  She denies any  lateralizing chest pain, increasing cough, or recurrent hemoptysis or active  sinus symptoms, dysphagia, definite fever or shaking chills.   PAST MEDICAL HISTORY:  1.  Traumatic fracture of the right ankle, for which she has been immobile.      We made every effort to have  physical therapy contact the [facility]      doctor to help her with mobilization on her previous admission, but it      is not clear that she has been doing this at home.  2.  Status post lumbar disk surgery with chronic back pain and narcotic      dependency.  3.  Depression.  4.  Chronic right foot drop secondary to previous back surgery.  5.  Diabetes.  6.  Hypertension.   SOCIAL HISTORY:  She has been living in a hotel.  Is not an active smoker.   FAMILY HISTORY:  Negative for clotting disorders.  Negative for lung cancer.   REVIEW OF SYSTEMS:  Taken in detail and essentially negative.   ALLERGIES:  None known.   MEDICATIONS:  She has a long list of medicines that were recorded on the  nursing intake form, but I am not convinced that she actually takes any of  them as they are listed.  A note should be made that when she was  in the  hospital previously on the medications that she was discharged on, she  showed a definite improvement.   PHYSICAL EXAMINATION:  VITAL SIGNS:  She is afebrile with normal vital  signs.  Her saturations were 88% on room air but note that her baseline is  in the mid 80s on room air, and she is supposed to be on oxygen 24 hours a  day.  When we placed her back on oxygen, her saturations returned to the  lower 90s.  GENERAL:  This is a depressed-appearing white female with almost a belle  indifference affect and attitude.  HEENT:  Unremarkable.  Pharynx is clear.  LUNGS:  Lung fields are clear bilaterally to auscultation and percussion,  although breath sounds are diminished.  I could not appreciate a rub.  There  was no increase in P2 or tachycardia.  ABDOMEN:  Obese, otherwise benign with no definite focal tenderness, rebound  or guarding.  She did have chronic venous stasis changes in the lower  extremities with 1+ pitting edema.  NEUROLOGIC:  No focal deficits or pathologic reflexes were apparent.  SKIN:  Warm and dry.   Lab data is still  pending at the time of this dictation, including cardiac  profile, chemistry profile, and ultimately, a repeat spiral CT scan.   IMPRESSION:  This patient's main pulmonary problem is morbid obesity and  immobilization.  This puts her at risk of restrictive physiology with  atelectasis (which occurred previously) and certainly at high risk for  gastroesophageal reflux disease, for which she is supposed to be treated  consistently (she is on Protonix, but I am not convinced she takes it).  I  would add prokinetic therapy to her if she remains bedridden with so much  narcotic use.   She is certainly at high risk for deep venous thrombosis pulmonary embolism,  and I believe a CT scan is warranted, assuming her creatinine is normal.  Even if we rule out pulmonary embolus now, she is going to be high risk  because we will not be able to anticoagulate her based on her reported  bloody stools, given the fact that she is anemic (I note that she has  previously had an extensive GI workup, but I believe a repeat workup will be  in order).  That is, we need to have definite evidence that she has had deep  venous thrombosis or pulmonary embolism, to make any significant change in  therapy, which would include probably a permanent [place] filter (which,  of course, would further exacerbate her chronic venous stasis changes in her  lower extremities).   I really do not have anything further to add to this patient.  Pulmonary  critical care would be happy to see this patient on a p.r.n. basis to answer  any specific issues and certainly once regarding placement of the filter  once we have the CT scan back.   I note that the previous evaluation for hemoptysis previously did not yield  a specific diagnosis, I think largely because she tended to exaggerate the  actual profundity or severity of the disorder.  Hopefully, we will find that this will be the case on this admission as well.  Unfortunately,  we have  very limited medical options to treat this lady.      MBW/MEDQ  D:  12/30/2003  T:  12/30/2003  Job:  [id number]   cc:   Leighton Roach. Truett Perna, M.D.  [address]    [place] [address]  Fax: [phone]

## 2010-08-10 NOTE — H&P (Signed)
[name], [name]                       ACCOUNT NO.:  [id number]   MEDICAL RECORD NO.:  [id number]                   PATIENT TYPE:  INP   LOCATION:  0443                                 FACILITY:  [facility]   PHYSICIAN:  Leighton Roach. Truett Perna, M.D.              DATE OF BIRTH:  [birth year]   DATE OF ADMISSION:  12/01/2003  DATE OF DISCHARGE:                                HISTORY & PHYSICAL   CHIEF COMPLAINT:  Malaise, low grade fever, anorexia, hemoptysis and  shortness of breath.   HISTORY OF PRESENT ILLNESS:  [name] is a 40 year old woman with multiple  medical problems.  She is followed by Dr. Truett Perna for anemia,  thrombocytopenia, and mild leukopenia.  She was initially evaluated by Dr.  Truett Perna on October 20, 2003 during a hospitalization.  She was admitted by Dr.  Maple Hudson for evaluation of hemoptysis and fever.  She was felt to possibly have  a viral illness.  The fevers apparently resolved.  The hemoptysis was felt  to possibly be coffee-ground hematemesis related to gastritis.  She  underwent gastrointestinal evaluation per Dr. Bernette Redbird, including  esophagogastroduodenoscopy and flexible sigmoidoscopy with no source of  blood loss identified.   Laboratory work at the time of discharge showed hemoglobin of 8.9, MCV 77.7,  white blood cell count 3.6, platelet count 224,000.  Dr. Truett Perna felt that  the blood smear was most consistent with iron deficiency and recommended a  trial of ferrous sulfate four times daily.  She was seen in follow up by the  cancer center on November 11, 2003 with CBC showing a hemoglobin of 10.7, MCV  83, white blood cell count 3.0, ANC 1.6 and platelet count 127,000.  Repeat  laboratory work on November 17, 2003 showed hemoglobin 10.0, MCV 85, white  blood cell count 2.6, ANC 1.4 and platelet count 105,000.  CMET was normal  overall except for glucose 197 and sodium 133.  LDH was normal.  Reticulocyte count 1.7%.  Pro Time 13.8, PTT 53.  PTT mixing  study did not  correct.  Human immunodeficiency virus was negative as was hepatitis B  surface antigen and hepatitis C antibody.  A bone marrow biopsy was planned  as an outpatient.   The patient was seen in the office today for routine follow up.  She  reported malaise, low grade fever, anorexia, continued hemoptysis, easy  bruising and dyspnea.  CBC the day prior showed hemoglobin 9.1, MCV 80.9,  white blood cell count 4.2, ANC 2.2 and platelet count 124,000.  Oxygen  saturation on 2L was 86%.  With removal of the oxygen this declined to 80%.  She was admitted for further evaluation.   PAST MEDICAL HISTORY:  1.  Anemia/thrombocytopenia/mild leukopenia.  2.  Hypertension.  3.  Degenerative disc disease status post several lumbar surgeries.  4.  Morbid obesity.  5.  Diabetes mellitus.  6.  Chronic pain syndrome.  7.  Depression.  8.  Right foot drop following back surgery.  9.  Right ankle fracture recently.  10. Status post cholecystectomy.   CURRENT MEDICATIONS:  1.  Ferrous sulfate 325 mg four times daily.  2.  Folic acid daily.  3.  Vitamin C daily.  4.  Os-Cal with D 500 mg twice daily.  5.  Lotrel 5/20 daily.  6.  Zocor 40 mg daily.  7.  Neurontin 600 mg three times daily.  8.  Glucotrol XL 10 mg daily.  9.  Avandia 8 mg daily.  10. Duragesic 100 mcg patch, two patches, changes every three days.  11. Protonix 40 mg daily.  12. Cymbalta 60 mg daily.  13. Oxycodone with APAP 10/325 one to two every 4 hours as needed.  14. Xanax 1 mg q.4-6h. as needed.  15. Estazolam 2 mg at bedtime as needed.   ALLERGIES:  SULFA, STADOL.   FAMILY HISTORY:  Mother is living.  She has a history of cerebrovascular  accident and heart disease.  Father deceased with a blood clot.  He had a  history of anemia.   SOCIAL HISTORY:  The patient currently resides at a hotel with her Fiance'.  Her home is being cleaned related to mold.  She has no children.  She was  previously employed as a  Social worker.  She has no history of ETOH or  tobacco use.   REVIEW OF SYMPTOMS:  Per history of present illness.   PHYSICAL EXAMINATION:  VITAL SIGNS:  Temperature 99.4, heart rate 101,  respirations 24, blood pressure 122/72, oxygen saturation 86% on 2L, 80% on  room air.  GENERAL:  Obese Caucasian female in no acute distress.  HEENT:  Normocephalic, atraumatic.  Aphthous ulcer right lateral tongue.  No  thrush.  Mucous membranes are pink and moist.  LUNGS:  Lung sounds diminished globally, clear.  CARDIOVASCULAR:  Regular rate and rhythm.  ABDOMEN:  Obese, soft.  EXTREMITIES:  Minimal lower extremity edema.  NEUROLOGICAL:  Alert and oriented.  Moves all extremities.   LABORATORY DATA:  November 30, 2003 hemoglobin 9.1, white blood cell count  4.2, platelet count 124,000, MCV 80.9.   Pending laboratory work includes [employer], lupus  anticoagulant, beta 2 glycoprotein 1, factor VIII and PTT.   IMPRESSION AND PLANS:  1.  Anemia/thrombocytopenia/mild leukopenia of unclear etiology.  We will      proceed with a bone marrow biopsy when the elevated PTT is further      evaluated.  2.  Hypoxia/report of hemoptysis.  We will ask pulmonary to evaluate.  3.  Malaise/low grade fever/anorexia- etiology unclear.  4.  Hypertension/diabetes mellitus/chronic pain syndrome/degenerative disc      disease/depression- we will ask Dr. Lucianne Muss to assist with management.   The patient was interviewed and examined by Dr. Truett Perna; plan reviewed.     [name], N.P.                         Leighton Roach. Truett Perna, M.D.    LT/MEDQ  D:  12/02/2003  T:  12/02/2003  Job:  [id number]   cc:   Reather Littler, M.D.  [address]  [place]  [place] [address]  Fax: [phone]

## 2010-10-22 ENCOUNTER — Encounter (HOSPITAL_BASED_OUTPATIENT_CLINIC_OR_DEPARTMENT_OTHER): Payer: Medicare Other | Attending: Internal Medicine

## 2010-10-22 DIAGNOSIS — I10 Essential (primary) hypertension: Secondary | ICD-10-CM | POA: Insufficient documentation

## 2010-10-22 DIAGNOSIS — E119 Type 2 diabetes mellitus without complications: Secondary | ICD-10-CM | POA: Insufficient documentation

## 2010-10-22 DIAGNOSIS — L89209 Pressure ulcer of unspecified hip, unspecified stage: Secondary | ICD-10-CM | POA: Insufficient documentation

## 2010-10-22 DIAGNOSIS — L8992 Pressure ulcer of unspecified site, stage 2: Secondary | ICD-10-CM | POA: Insufficient documentation

## 2010-10-22 DIAGNOSIS — Z79899 Other long term (current) drug therapy: Secondary | ICD-10-CM | POA: Insufficient documentation

## 2010-10-22 DIAGNOSIS — E039 Hypothyroidism, unspecified: Secondary | ICD-10-CM | POA: Insufficient documentation
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
## 2010-11-05 ENCOUNTER — Encounter (HOSPITAL_BASED_OUTPATIENT_CLINIC_OR_DEPARTMENT_OTHER): Payer: Medicare Other

## 2010-11-05 DIAGNOSIS — E119 Type 2 diabetes mellitus without complications: Secondary | ICD-10-CM | POA: Insufficient documentation

## 2010-11-05 DIAGNOSIS — I10 Essential (primary) hypertension: Secondary | ICD-10-CM | POA: Insufficient documentation

## 2010-11-05 DIAGNOSIS — L8992 Pressure ulcer of unspecified site, stage 2: Secondary | ICD-10-CM | POA: Insufficient documentation

## 2010-11-05 DIAGNOSIS — L89209 Pressure ulcer of unspecified hip, unspecified stage: Secondary | ICD-10-CM | POA: Insufficient documentation

## 2010-11-05 DIAGNOSIS — Z79899 Other long term (current) drug therapy: Secondary | ICD-10-CM | POA: Insufficient documentation

## 2010-11-05 DIAGNOSIS — E039 Hypothyroidism, unspecified: Secondary | ICD-10-CM | POA: Insufficient documentation

## 2010-12-25 ENCOUNTER — Encounter (HOSPITAL_BASED_OUTPATIENT_CLINIC_OR_DEPARTMENT_OTHER): Payer: Medicare Other | Attending: Internal Medicine

## 2010-12-25 DIAGNOSIS — L89209 Pressure ulcer of unspecified hip, unspecified stage: Secondary | ICD-10-CM | POA: Insufficient documentation

## 2010-12-25 DIAGNOSIS — IMO0001 Reserved for inherently not codable concepts without codable children: Secondary | ICD-10-CM | POA: Insufficient documentation

## 2010-12-25 DIAGNOSIS — E119 Type 2 diabetes mellitus without complications: Secondary | ICD-10-CM | POA: Insufficient documentation

## 2010-12-25 DIAGNOSIS — J45909 Unspecified asthma, uncomplicated: Secondary | ICD-10-CM | POA: Insufficient documentation

## 2010-12-25 DIAGNOSIS — E039 Hypothyroidism, unspecified: Secondary | ICD-10-CM | POA: Insufficient documentation

## 2010-12-25 DIAGNOSIS — L8990 Pressure ulcer of unspecified site, unspecified stage: Secondary | ICD-10-CM | POA: Insufficient documentation

## 2010-12-25 DIAGNOSIS — I10 Essential (primary) hypertension: Secondary | ICD-10-CM | POA: Insufficient documentation

## 2011-01-15 ENCOUNTER — Other Ambulatory Visit (HOSPITAL_BASED_OUTPATIENT_CLINIC_OR_DEPARTMENT_OTHER): Payer: Self-pay | Admitting: Internal Medicine

## 2011-01-15 LAB — GLUCOSE, CAPILLARY: Glucose-Capillary: 236 mg/dL — ABNORMAL HIGH (ref 70–99)

## 2011-01-15 NOTE — H&P (Signed)
  NAMECATHARINA, [name] NO.:  [id number]  MEDICAL RECORD NO.:  [id number]  LOCATION:  FOOT                         FACILITY:  [facility]  PHYSICIAN:  Joanne Gavel, M.D.        DATE OF BIRTH:  [birth year]  DATE OF ADMISSION:  12/25/2010 DATE OF DISCHARGE:                             HISTORY & PHYSICAL   CHIEF COMPLAINT:  Wound, right hip.  HISTORY OF PRESENT ILLNESS:  This is a 40 year old female with multiple back operations and a right footdrop who is very limited in her mobility because of this reason and because of morbid obesity has developed decubitus ulcer of the hip.  This has been present for 10 months, but is now getting larger and has a discharge, which is causing a problem.  PAST MEDICAL HISTORY:  Significant for, 1. Diabetes. 2. Hypertension. 3. Footdrop. 4. Multiple back problems. 5. Hypothyroidism. 6. Migraine headache. 7. Asthma. 8. Morbid obesity. 9. Hypoventilation syndrome. 10.Fibromyalgia. 11.History of MRSA infections in the hip.  PAST SURGICAL HISTORY:  History of brain surgery in 07-19-1968, gallbladder operation in 1984, back surgery x2, elbow surgery on the right.  CIGARETTES:  None.  ALCOHOL:  None.  ALLERGIES:  SULFA, STADOL, and AUGMENTIN.  PHYSICAL EXAMINATION:  GENERAL:  The patient is extremely obese, awake, alert, and oriented. VITAL SIGNS:  Temperature 99, pulse 80, respirations 18, blood pressure 122/84, glucose is 202. HEAD, EYES, EARS, NOSE, AND THROAT:  Negative.  The patient lying with her left side down has a wound to the right hip 3.0 x 2.8 x 0.7.  This has a very shaggy base and surrounded by violaceous skin.  She is wearing a brace on her right lower leg, ankle, and foot.  ADMITTING IMPRESSION:  Decubitus ulcer in a patient whose movement is quite limited.  PLAN:  After some debridement of the base, the patient will be started on Santyl and hydrogel every other day.  When we get the wound cleaned up, we will  consider VAC dressing.  In addition, she is going to have wound healing consultation to get her a special bed and wheelchair cushion.     Joanne Gavel, M.D.     RA/MEDQ  D:  12/25/2010  T:  12/25/2010  Job:  [id number]  Electronically Signed by Joanne Gavel M.D. on 01/15/2011 09:16:07 AM

## 2011-01-29 ENCOUNTER — Encounter (HOSPITAL_BASED_OUTPATIENT_CLINIC_OR_DEPARTMENT_OTHER): Payer: Medicare Other | Attending: Internal Medicine

## 2011-01-29 DIAGNOSIS — IMO0001 Reserved for inherently not codable concepts without codable children: Secondary | ICD-10-CM | POA: Insufficient documentation

## 2011-01-29 DIAGNOSIS — J45909 Unspecified asthma, uncomplicated: Secondary | ICD-10-CM | POA: Insufficient documentation

## 2011-01-29 DIAGNOSIS — E119 Type 2 diabetes mellitus without complications: Secondary | ICD-10-CM | POA: Insufficient documentation

## 2011-01-29 DIAGNOSIS — L89209 Pressure ulcer of unspecified hip, unspecified stage: Secondary | ICD-10-CM | POA: Insufficient documentation

## 2011-01-29 DIAGNOSIS — E039 Hypothyroidism, unspecified: Secondary | ICD-10-CM | POA: Insufficient documentation

## 2011-01-29 DIAGNOSIS — I10 Essential (primary) hypertension: Secondary | ICD-10-CM | POA: Insufficient documentation

## 2011-01-29 DIAGNOSIS — L8990 Pressure ulcer of unspecified site, unspecified stage: Secondary | ICD-10-CM | POA: Insufficient documentation

## 2011-02-12 ENCOUNTER — Other Ambulatory Visit (HOSPITAL_BASED_OUTPATIENT_CLINIC_OR_DEPARTMENT_OTHER): Payer: Self-pay | Admitting: Internal Medicine

## 2011-02-12 ENCOUNTER — Ambulatory Visit (HOSPITAL_COMMUNITY)
Admission: RE | Admit: 2011-02-12 | Discharge: 2011-02-12 | Disposition: A | Discharge status: Home / Self Care | Payer: Medicare Other | Source: Ambulatory Visit | Attending: Internal Medicine | Admitting: Internal Medicine

## 2011-02-12 DIAGNOSIS — M869 Osteomyelitis, unspecified: Secondary | ICD-10-CM

## 2011-02-12 DIAGNOSIS — L97409 Non-pressure chronic ulcer of unspecified heel and midfoot with unspecified severity: Secondary | ICD-10-CM | POA: Insufficient documentation

## 2011-02-18 ENCOUNTER — Emergency Department (HOSPITAL_COMMUNITY): Payer: Medicare Other

## 2011-02-18 ENCOUNTER — Inpatient Hospital Stay (HOSPITAL_COMMUNITY)
Admission: EM | Admit: 2011-02-18 | Discharge: 2011-03-08 | DRG: 637 | Disposition: A | Discharge status: Home Health | Payer: Medicare Other | Attending: Internal Medicine | Admitting: Internal Medicine

## 2011-02-18 DIAGNOSIS — E662 Morbid (severe) obesity with alveolar hypoventilation: Secondary | ICD-10-CM | POA: Diagnosis present

## 2011-02-18 DIAGNOSIS — L8993 Pressure ulcer of unspecified site, stage 3: Secondary | ICD-10-CM | POA: Diagnosis present

## 2011-02-18 DIAGNOSIS — E11621 Type 2 diabetes mellitus with foot ulcer: Secondary | ICD-10-CM | POA: Diagnosis present

## 2011-02-18 DIAGNOSIS — G8929 Other chronic pain: Secondary | ICD-10-CM

## 2011-02-18 DIAGNOSIS — L89209 Pressure ulcer of unspecified hip, unspecified stage: Secondary | ICD-10-CM | POA: Diagnosis present

## 2011-02-18 DIAGNOSIS — IMO0001 Reserved for inherently not codable concepts without codable children: Secondary | ICD-10-CM | POA: Diagnosis present

## 2011-02-18 DIAGNOSIS — G894 Chronic pain syndrome: Secondary | ICD-10-CM | POA: Diagnosis present

## 2011-02-18 DIAGNOSIS — E119 Type 2 diabetes mellitus without complications: Secondary | ICD-10-CM | POA: Diagnosis present

## 2011-02-18 DIAGNOSIS — F329 Major depressive disorder, single episode, unspecified: Secondary | ICD-10-CM | POA: Diagnosis present

## 2011-02-18 DIAGNOSIS — L02619 Cutaneous abscess of unspecified foot: Secondary | ICD-10-CM | POA: Diagnosis present

## 2011-02-18 DIAGNOSIS — M216X9 Other acquired deformities of unspecified foot: Secondary | ICD-10-CM | POA: Diagnosis present

## 2011-02-18 DIAGNOSIS — K439 Ventral hernia without obstruction or gangrene: Secondary | ICD-10-CM | POA: Diagnosis present

## 2011-02-18 DIAGNOSIS — F3289 Other specified depressive episodes: Secondary | ICD-10-CM | POA: Diagnosis present

## 2011-02-18 DIAGNOSIS — Z833 Family history of diabetes mellitus: Secondary | ICD-10-CM

## 2011-02-18 DIAGNOSIS — E039 Hypothyroidism, unspecified: Secondary | ICD-10-CM

## 2011-02-18 DIAGNOSIS — E1165 Type 2 diabetes mellitus with hyperglycemia: Secondary | ICD-10-CM | POA: Diagnosis present

## 2011-02-18 DIAGNOSIS — I891 Lymphangitis: Secondary | ICD-10-CM

## 2011-02-18 DIAGNOSIS — R5381 Other malaise: Secondary | ICD-10-CM | POA: Diagnosis not present

## 2011-02-18 DIAGNOSIS — Z79899 Other long term (current) drug therapy: Secondary | ICD-10-CM

## 2011-02-18 DIAGNOSIS — L03119 Cellulitis of unspecified part of limb: Secondary | ICD-10-CM | POA: Diagnosis present

## 2011-02-18 DIAGNOSIS — G4730 Sleep apnea, unspecified: Secondary | ICD-10-CM

## 2011-02-18 DIAGNOSIS — J069 Acute upper respiratory infection, unspecified: Secondary | ICD-10-CM

## 2011-02-18 DIAGNOSIS — R5383 Other fatigue: Secondary | ICD-10-CM | POA: Diagnosis not present

## 2011-02-18 DIAGNOSIS — L0390 Cellulitis, unspecified: Secondary | ICD-10-CM

## 2011-02-18 DIAGNOSIS — L97409 Non-pressure chronic ulcer of unspecified heel and midfoot with unspecified severity: Secondary | ICD-10-CM | POA: Diagnosis present

## 2011-02-18 DIAGNOSIS — L0391 Acute lymphangitis, unspecified: Secondary | ICD-10-CM | POA: Diagnosis present

## 2011-02-18 DIAGNOSIS — T4275XA Adverse effect of unspecified antiepileptic and sedative-hypnotic drugs, initial encounter: Secondary | ICD-10-CM | POA: Diagnosis not present

## 2011-02-18 DIAGNOSIS — M1990 Unspecified osteoarthritis, unspecified site: Secondary | ICD-10-CM | POA: Diagnosis present

## 2011-02-18 DIAGNOSIS — Z8614 Personal history of Methicillin resistant Staphylococcus aureus infection: Secondary | ICD-10-CM

## 2011-02-18 DIAGNOSIS — J45909 Unspecified asthma, uncomplicated: Secondary | ICD-10-CM | POA: Diagnosis present

## 2011-02-18 DIAGNOSIS — L97509 Non-pressure chronic ulcer of other part of unspecified foot with unspecified severity: Secondary | ICD-10-CM

## 2011-02-18 DIAGNOSIS — E118 Type 2 diabetes mellitus with unspecified complications: Secondary | ICD-10-CM | POA: Diagnosis present

## 2011-02-18 DIAGNOSIS — I10 Essential (primary) hypertension: Secondary | ICD-10-CM | POA: Diagnosis present

## 2011-02-18 DIAGNOSIS — IMO0002 Reserved for concepts with insufficient information to code with codable children: Principal | ICD-10-CM | POA: Diagnosis present

## 2011-02-18 DIAGNOSIS — K5900 Constipation, unspecified: Secondary | ICD-10-CM | POA: Clinically undetermined

## 2011-02-18 HISTORY — DX: Fibromyalgia: M79.7

## 2011-02-18 HISTORY — DX: Hypothyroidism, unspecified: E03.9

## 2011-02-18 HISTORY — DX: Essential (primary) hypertension: I10

## 2011-02-18 HISTORY — DX: Foot drop, right foot: M21.371

## 2011-02-18 HISTORY — DX: Personal history of other diseases of the nervous system and sense organs: Z86.69

## 2011-02-18 HISTORY — DX: Morbid (severe) obesity with alveolar hypoventilation: E66.2

## 2011-02-18 HISTORY — DX: Unspecified osteoarthritis, unspecified site: M19.90

## 2011-02-18 HISTORY — DX: Major depressive disorder, single episode, unspecified: F32.9

## 2011-02-18 HISTORY — DX: Depression, unspecified: F32.A

## 2011-02-18 HISTORY — DX: Dorsalgia, unspecified: M54.9

## 2011-02-18 LAB — COMPREHENSIVE METABOLIC PANEL
ALT: 11 U/L (ref 0–35)
AST: 19 U/L (ref 0–37)
Albumin: 3.1 g/dL — ABNORMAL LOW (ref 3.5–5.2)
Alkaline Phosphatase: 102 U/L (ref 39–117)
BUN: 26 mg/dL — ABNORMAL HIGH (ref 6–23)
CO2: 30 mEq/L (ref 19–32)
Calcium: 9.2 mg/dL (ref 8.4–10.5)
Chloride: 98 mEq/L (ref 96–112)
Creatinine, Ser: 1.29 mg/dL — ABNORMAL HIGH (ref 0.50–1.10)
GFR calc Af Amer: 58 mL/min — ABNORMAL LOW (ref >90)
GFR calc non Af Amer: 50 mL/min — ABNORMAL LOW (ref >90)
Glucose, Bld: 85 mg/dL (ref 70–99)
Potassium: 4.5 mEq/L (ref 3.5–5.1)
Sodium: 134 mEq/L — ABNORMAL LOW (ref 135–145)
Total Bilirubin: 0.2 mg/dL — ABNORMAL LOW (ref 0.3–1.2)
Total Protein: 7 g/dL (ref 6.0–8.3)

## 2011-02-18 LAB — CBC
HCT: 36.7 % (ref 36.0–46.0)
Hemoglobin: 11.7 g/dL — ABNORMAL LOW (ref 12.0–15.0)
MCH: 30.4 pg (ref 26.0–34.0)
MCHC: 31.9 g/dL (ref 30.0–36.0)
MCV: 95.3 fL (ref 78.0–100.0)
Platelets: 259 10*3/uL (ref 150–400)
RBC: 3.85 MIL/uL — ABNORMAL LOW (ref 3.87–5.11)
RDW: 13 % (ref 11.5–15.5)
WBC: 12.2 10*3/uL — ABNORMAL HIGH (ref 4.0–10.5)

## 2011-02-18 LAB — DIFFERENTIAL
Basophils Absolute: 0 10*3/uL (ref 0.0–0.1)
Basophils Relative: 0 % (ref 0–1)
Eosinophils Absolute: 0.7 10*3/uL (ref 0.0–0.7)
Eosinophils Relative: 5 % (ref 0–5)
Lymphocytes Relative: 31 % (ref 12–46)
Lymphs Abs: 3.8 10*3/uL (ref 0.7–4.0)
Monocytes Absolute: 1.8 10*3/uL — ABNORMAL HIGH (ref 0.1–1.0)
Monocytes Relative: 14 % — ABNORMAL HIGH (ref 3–12)
Neutro Abs: 5.9 10*3/uL (ref 1.7–7.7)
Neutrophils Relative %: 49 % (ref 43–77)

## 2011-02-18 IMAGING — CR DG FOOT COMPLETE 3+V*R*
3 series · 3 of 3 positions shown · non-contrast
Comparison: [DATE]

CLINICAL DATA: Heel pressure open wound cellulitis.  Rule out
osteomyelitis.

RIGHT FOOT COMPLETE - 3+ VIEW

[x foot lat right]
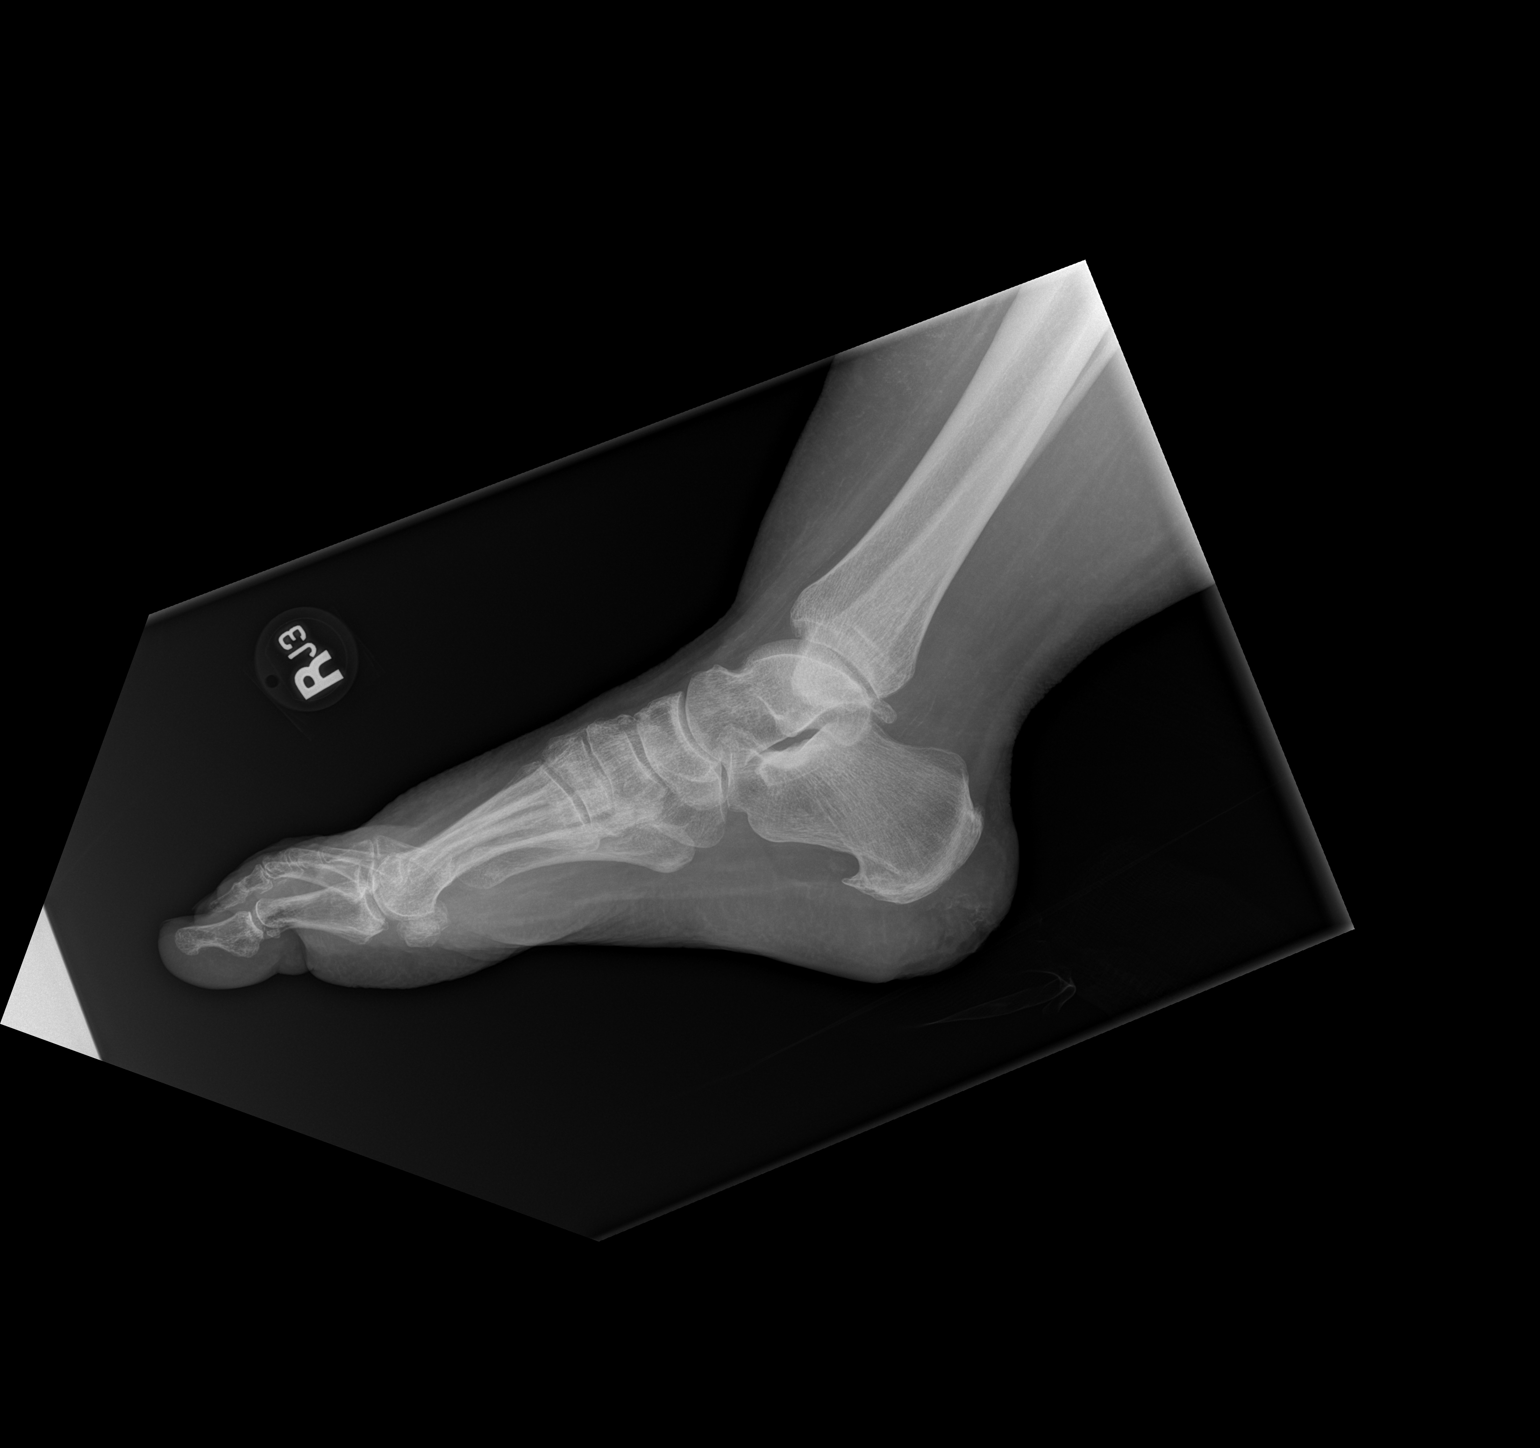

[x foot ap right (1 of 2)]
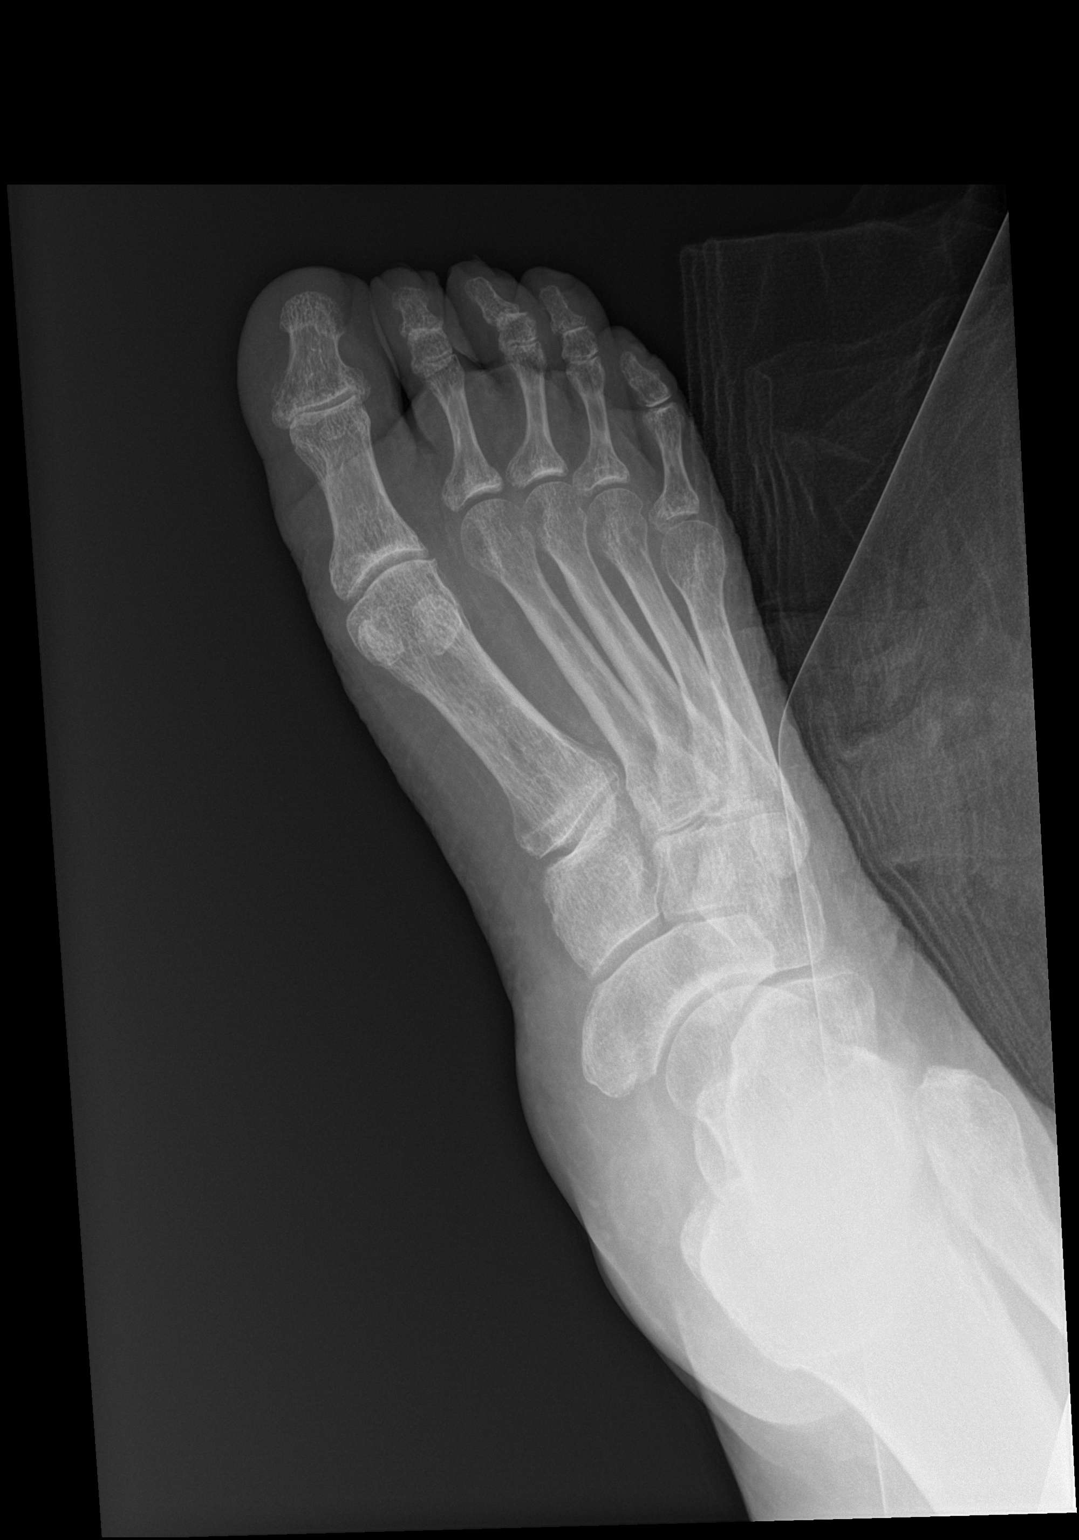

[x foot ap right (2 of 2)]
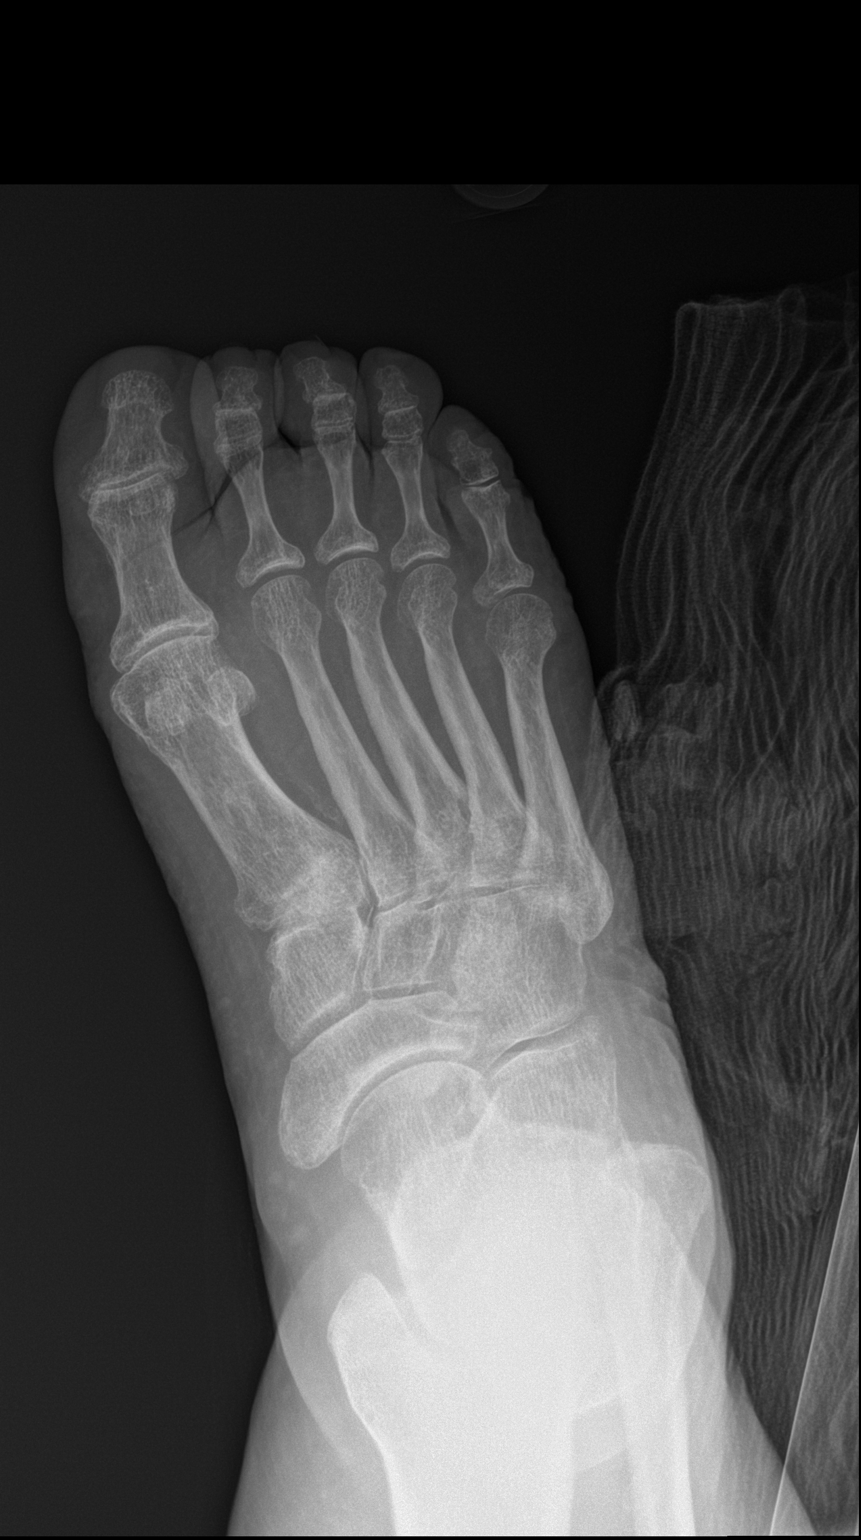

[3 of 3 positions shown; findings below may reference images not displayed]

FINDINGS: Bones appear osteopenic.  There is soft tissue defect in
the heel, overlying the posterior aspect of the calcaneus.  The
soft tissue defect appears deeper than on the previous study.  In
addition, there is question of cortical disruption adjacent to the
soft tissue ulceration which can be seen and early osteomyelitis.
Note is made of a plantar calcaneal spur.  There is soft tissue
swelling of the forefoot.  No evidence for acute fracture.
IMPRESSION: 1.  Soft tissue ulceration appears more significant.
2.  Question of early osteomyelitis of the calcaneus.

## 2011-02-18 MED ORDER — VANCOMYCIN HCL IN DEXTROSE 1-5 GM/200ML-% IV SOLN
1000.0000 mg | Freq: Once | INTRAVENOUS | Status: AC
  Administered 2011-02-19: 1000 mg via INTRAVENOUS
  Filled 2011-02-18: qty 200

## 2011-02-18 MED ORDER — OXYCODONE-ACETAMINOPHEN 5-325 MG PO TABS
1.0000 | ORAL_TABLET | ORAL | Status: DC | PRN
  Administered 2011-02-18 – 2011-03-08 (×44): 1 via ORAL
  Filled 2011-02-18 (×45): qty 1

## 2011-02-18 MED ORDER — LINAGLIPTIN 5 MG PO TABS
5.0000 mg | ORAL_TABLET | Freq: Every day | ORAL | Status: DC
  Administered 2011-02-19 – 2011-03-08 (×18): 5 mg via ORAL
  Filled 2011-02-18 (×19): qty 1

## 2011-02-18 MED ORDER — GABAPENTIN 300 MG PO CAPS
300.0000 mg | ORAL_CAPSULE | Freq: Every day | ORAL | Status: DC
  Administered 2011-02-19 – 2011-03-07 (×17): 300 mg via ORAL
  Filled 2011-02-18 (×20): qty 1

## 2011-02-18 MED ORDER — SODIUM CHLORIDE 0.9 % IV SOLN
Freq: Once | INTRAVENOUS | Status: AC
  Administered 2011-02-18: 17:00:00 via INTRAVENOUS

## 2011-02-18 MED ORDER — OXYCODONE-ACETAMINOPHEN 10-325 MG PO TABS: 1.0000 | ORAL_TABLET | ORAL | Status: DC | PRN

## 2011-02-18 MED ORDER — LIRAGLUTIDE 18 MG/3ML ~~LOC~~ SOLN
1.8000 mg | Freq: Every day | SUBCUTANEOUS | Status: DC
  Filled 2011-02-18: qty 0.3

## 2011-02-18 MED ORDER — DEXTROSE 5 % IV SOLN
2.0000 g | Freq: Three times a day (TID) | INTRAVENOUS | Status: DC
  Administered 2011-02-18 – 2011-02-22 (×12): 2 g via INTRAVENOUS
  Filled 2011-02-18 (×18): qty 2

## 2011-02-18 MED ORDER — ACETAMINOPHEN 325 MG PO TABS
650.0000 mg | ORAL_TABLET | Freq: Four times a day (QID) | ORAL | Status: DC | PRN
  Filled 2011-02-18: qty 2

## 2011-02-18 MED ORDER — ENOXAPARIN SODIUM 40 MG/0.4ML ~~LOC~~ SOLN
40.0000 mg | Freq: Every day | SUBCUTANEOUS | Status: DC
  Administered 2011-02-19 – 2011-02-26 (×8): 40 mg via SUBCUTANEOUS
  Filled 2011-02-18 (×10): qty 0.4

## 2011-02-18 MED ORDER — BIOTIN 5000 MCG PO CAPS: 2.0000 | ORAL_CAPSULE | Freq: Two times a day (BID) | ORAL | Status: DC

## 2011-02-18 MED ORDER — SITAGLIPTIN PHOS-METFORMIN HCL 50-1000 MG PO TABS: 1.0000 | ORAL_TABLET | Freq: Two times a day (BID) | ORAL | Status: DC

## 2011-02-18 MED ORDER — METOCLOPRAMIDE HCL 10 MG PO TABS
10.0000 mg | ORAL_TABLET | Freq: Three times a day (TID) | ORAL | Status: DC
  Administered 2011-02-19 – 2011-03-08 (×53): 10 mg via ORAL
  Filled 2011-02-18 (×64): qty 1

## 2011-02-18 MED ORDER — METFORMIN HCL 500 MG PO TABS
1000.0000 mg | ORAL_TABLET | Freq: Two times a day (BID) | ORAL | Status: DC
  Administered 2011-02-19: 1000 mg via ORAL
  Filled 2011-02-18 (×2): qty 2

## 2011-02-18 MED ORDER — DIAZEPAM 5 MG PO TABS
10.0000 mg | ORAL_TABLET | Freq: Two times a day (BID) | ORAL | Status: DC | PRN
Start: 2011-02-18 — End: 2011-03-09
  Administered 2011-02-19 – 2011-03-08 (×4): 10 mg via ORAL
  Filled 2011-02-18 (×4): qty 2

## 2011-02-18 MED ORDER — LEVOTHYROXINE SODIUM 112 MCG PO TABS
112.0000 ug | ORAL_TABLET | Freq: Every day | ORAL | Status: DC
  Administered 2011-02-19 – 2011-03-08 (×18): 112 ug via ORAL
  Filled 2011-02-18 (×19): qty 1

## 2011-02-18 MED ORDER — ACETAMINOPHEN 650 MG RE SUPP: 650.0000 mg | Freq: Four times a day (QID) | RECTAL | Status: DC | PRN

## 2011-02-18 MED ORDER — FENTANYL 100 MCG/HR TD PT72: 500.0000 ug | MEDICATED_PATCH | TRANSDERMAL | Status: DC

## 2011-02-18 MED ORDER — CITALOPRAM HYDROBROMIDE 20 MG PO TABS
20.0000 mg | ORAL_TABLET | Freq: Every day | ORAL | Status: DC
  Administered 2011-02-19 – 2011-02-23 (×5): 20 mg via ORAL
  Filled 2011-02-18 (×6): qty 1

## 2011-02-18 MED ORDER — PREGABALIN 75 MG PO CAPS
75.0000 mg | ORAL_CAPSULE | Freq: Three times a day (TID) | ORAL | Status: DC
  Administered 2011-02-19 – 2011-03-08 (×53): 75 mg via ORAL
  Filled 2011-02-18 (×52): qty 1
  Filled 2011-02-18: qty 3

## 2011-02-18 MED ORDER — HYDROMORPHONE HCL 4 MG PO TABS
8.0000 mg | ORAL_TABLET | ORAL | Status: DC | PRN
  Administered 2011-02-19 – 2011-03-01 (×21): 8 mg via ORAL
  Filled 2011-02-18 (×6): qty 2
  Filled 2011-02-18: qty 4
  Filled 2011-02-18 (×6): qty 2
  Filled 2011-02-18: qty 4
  Filled 2011-02-18 (×6): qty 2
  Filled 2011-02-18: qty 4

## 2011-02-18 MED ORDER — INSULIN ASPART 100 UNIT/ML ~~LOC~~ SOLN
0.0000 [IU] | Freq: Three times a day (TID) | SUBCUTANEOUS | Status: DC
  Administered 2011-02-20: 3 [IU] via SUBCUTANEOUS
  Administered 2011-02-21: 7 [IU] via SUBCUTANEOUS
  Administered 2011-02-21: 3 [IU] via SUBCUTANEOUS
  Administered 2011-02-22: 4 [IU] via SUBCUTANEOUS
  Administered 2011-02-22 – 2011-02-23 (×2): 3 [IU] via SUBCUTANEOUS
  Administered 2011-02-23 – 2011-02-24 (×2): 4 [IU] via SUBCUTANEOUS
  Administered 2011-02-24: 3 [IU] via SUBCUTANEOUS
  Administered 2011-02-24 – 2011-02-25 (×2): 4 [IU] via SUBCUTANEOUS
  Administered 2011-02-25: 15 [IU] via SUBCUTANEOUS
  Administered 2011-02-26: 4 [IU] via SUBCUTANEOUS
  Administered 2011-02-26: 3 [IU] via SUBCUTANEOUS
  Administered 2011-02-26: 4 [IU] via SUBCUTANEOUS
  Administered 2011-02-27: 3 [IU] via SUBCUTANEOUS
  Administered 2011-02-27: 4 [IU] via SUBCUTANEOUS
  Administered 2011-02-27: 3 [IU] via SUBCUTANEOUS
  Administered 2011-02-28: 7 [IU] via SUBCUTANEOUS
  Administered 2011-02-28 (×2): 4 [IU] via SUBCUTANEOUS
  Administered 2011-03-01: 11 [IU] via SUBCUTANEOUS
  Administered 2011-03-01: 3 [IU] via SUBCUTANEOUS
  Administered 2011-03-01 – 2011-03-02 (×2): 4 [IU] via SUBCUTANEOUS
  Administered 2011-03-02: 7 [IU] via SUBCUTANEOUS
  Administered 2011-03-03: 4 [IU] via SUBCUTANEOUS
  Administered 2011-03-03: 7 [IU] via SUBCUTANEOUS
  Administered 2011-03-03: 4 [IU] via SUBCUTANEOUS
  Administered 2011-03-04 (×2): 3 [IU] via SUBCUTANEOUS
  Administered 2011-03-04: 4 [IU] via SUBCUTANEOUS
  Administered 2011-03-05 (×3): 11 [IU] via SUBCUTANEOUS
  Administered 2011-03-06: 4 [IU] via SUBCUTANEOUS
  Administered 2011-03-07: 7 [IU] via SUBCUTANEOUS
  Administered 2011-03-07 – 2011-03-08 (×3): 3 [IU] via SUBCUTANEOUS
  Administered 2011-03-08: 4 [IU] via SUBCUTANEOUS
  Filled 2011-02-18: qty 3

## 2011-02-18 MED ORDER — VANCOMYCIN HCL IN DEXTROSE 1-5 GM/200ML-% IV SOLN
1000.0000 mg | Freq: Two times a day (BID) | INTRAVENOUS | Status: DC
  Filled 2011-02-18 (×2): qty 200

## 2011-02-18 MED ORDER — DEXTROSE 5 % IV SOLN
1.0000 g | Freq: Three times a day (TID) | INTRAVENOUS | Status: DC
  Filled 2011-02-18 (×3): qty 1

## 2011-02-18 MED ORDER — ENOXAPARIN SODIUM 40 MG/0.4ML ~~LOC~~ SOLN
40.0000 mg | Freq: Every day | SUBCUTANEOUS | Status: DC
  Filled 2011-02-18: qty 0.4

## 2011-02-18 MED ORDER — TETANUS-DIPHTH-ACELL PERTUSSIS 5-2.5-18.5 LF-MCG/0.5 IM SUSP
0.5000 mL | Freq: Once | INTRAMUSCULAR | Status: AC
  Administered 2011-02-18: 0.5 mL via INTRAMUSCULAR
  Filled 2011-02-18: qty 0.5

## 2011-02-18 MED ORDER — INSULIN GLARGINE 100 UNIT/ML ~~LOC~~ SOLN
85.0000 [IU] | Freq: Every day | SUBCUTANEOUS | Status: DC
  Administered 2011-02-20 – 2011-03-07 (×16): 85 [IU] via SUBCUTANEOUS
  Filled 2011-02-18 (×5): qty 3

## 2011-02-18 MED ORDER — VANCOMYCIN HCL 1000 MG IV SOLR
1500.0000 mg | Freq: Three times a day (TID) | INTRAVENOUS | Status: DC
  Administered 2011-02-19 – 2011-02-20 (×4): 1500 mg via INTRAVENOUS
  Filled 2011-02-18 (×8): qty 1500

## 2011-02-18 MED ORDER — VANCOMYCIN HCL IN DEXTROSE 1-5 GM/200ML-% IV SOLN
1000.0000 mg | INTRAVENOUS | Status: AC
  Administered 2011-02-18: 1000 mg via INTRAVENOUS
  Filled 2011-02-18: qty 200

## 2011-02-18 MED ORDER — DEXTROSE 5 % IV SOLN
2.0000 g | INTRAVENOUS | Status: AC
  Administered 2011-02-18: 2 g via INTRAVENOUS
  Filled 2011-02-18: qty 2

## 2011-02-18 MED ORDER — OXYCODONE HCL 5 MG PO TABS
5.0000 mg | ORAL_TABLET | ORAL | Status: DC | PRN
  Administered 2011-02-18 – 2011-03-08 (×40): 5 mg via ORAL
  Filled 2011-02-18 (×41): qty 1

## 2011-02-18 MED ORDER — SIMVASTATIN 20 MG PO TABS
20.0000 mg | ORAL_TABLET | Freq: Every day | ORAL | Status: DC
  Administered 2011-02-19 – 2011-03-08 (×18): 20 mg via ORAL
  Filled 2011-02-18 (×19): qty 1

## 2011-02-18 MED ORDER — INSULIN ASPART 100 UNIT/ML ~~LOC~~ SOLN: 0.0000 [IU] | Freq: Three times a day (TID) | SUBCUTANEOUS | Status: DC

## 2011-02-18 MED ORDER — CELECOXIB 200 MG PO CAPS
400.0000 mg | ORAL_CAPSULE | Freq: Two times a day (BID) | ORAL | Status: DC
  Administered 2011-02-19: 400 mg via ORAL
  Filled 2011-02-18 (×4): qty 2

## 2011-02-18 MED ORDER — FLUTICASONE PROPIONATE 50 MCG/ACT NA SUSP
2.0000 | Freq: Every day | NASAL | Status: DC
  Administered 2011-02-19 – 2011-03-08 (×17): 2 via NASAL
  Filled 2011-02-18 (×2): qty 16

## 2011-02-18 MED ORDER — MORPHINE SULFATE 4 MG/ML IJ SOLN
4.0000 mg | Freq: Once | INTRAMUSCULAR | Status: AC
  Administered 2011-02-18: 4 mg via INTRAVENOUS
  Filled 2011-02-18: qty 1

## 2011-02-18 MED ORDER — BENAZEPRIL HCL 10 MG PO TABS
10.0000 mg | ORAL_TABLET | Freq: Every day | ORAL | Status: DC
  Administered 2011-02-19 – 2011-03-08 (×15): 10 mg via ORAL
  Filled 2011-02-18 (×19): qty 1

## 2011-02-18 MED ORDER — SODIUM CHLORIDE 0.9 % IV SOLN
INTRAVENOUS | Status: DC
Start: 2011-02-18 — End: 2011-02-26
  Administered 2011-02-18 – 2011-02-26 (×9): via INTRAVENOUS

## 2011-02-18 NOTE — H&P (Signed)
[name] is an 40 y.o. female.   Chief Complaint: Right heal infection HPI: This is a history and physical on [name] dictating physician Dr. Arne Cleveland primary care physician Dr. Jearld Lesch. [name] is a 40 year old Caucasian female with multiple medical problems including morbid obesity, diabetes poorly controlled, right heel, and right hip poorly healing wounds, hypertension, and chronic pain syndrome. She had noticed that her right heel ulcer and wound was not responding getting worse over the last 3 days. She notices streaking marks going up her right leg from the heel wound. She has a wound VAC for her right hip wound and special special dressing and boot that she wears for her right heel wound. She is followed at the wound clinic, but states that she was supposed to have an appointment next week for a surgeon to look at her right hip wound.  Past Medical History  Diagnosis Date  . Diabetes mellitus   . Hypertension   . Wound healing, delayed     Past Surgical History  Procedure Date  . Cholecystectomy   . Back surgery     for lumbar disc disease X2  . Brain surgery 1970  . Right elbow     Family History  Problem Relation Age of Onset  . Diabetes type II Father   . Diabetes type II Mother    Social History:  reports that she has quit smoking. She does not have any smokeless tobacco history on file. She reports that she does not drink alcohol or use illicit drugs.  Allergies:  Allergies  Allergen Reactions  . Sulfa Antibiotics Hives    Medications Prior to Admission  Medication Dose Route Frequency Provider Last Rate Last Dose  . 0.9 %  sodium chloride infusion   Intravenous Once Grant Fontana, [place] 150 mL/hr at 02/18/11 1658    . cefTAZidime (FORTAZ) 2 g in dextrose 5 % 50 mL IVPB  2 g Intravenous To ER Grant Fontana, [place]   2 g at 02/18/11 1716  . morphine 4 MG/ML injection 4 mg  4 mg Intravenous Once Grant Fontana, [place]   4 mg at 02/18/11  1714  . TDaP (BOOSTRIX) injection 0.5 mL  0.5 mL Intramuscular Once Grant Fontana, PA   0.5 mL at 02/18/11 1720  . vancomycin (VANCOCIN) IVPB 1000 mg/200 mL premix  1,000 mg Intravenous To ER Grant Fontana, PA   1,000 mg at 02/18/11 1809   No current outpatient prescriptions on file as of 02/18/2011.    Results for orders placed during the hospital encounter of 02/18/11 (from the past 48 hour(s))  CBC     Status: Abnormal   Collection Time   02/18/11  4:10 PM      Component Value Range Comment   WBC 12.2 (*) 4.0 - 10.5 (K/uL)    RBC 3.85 (*) 3.87 - 5.11 (MIL/uL)    Hemoglobin 11.7 (*) 12.0 - 15.0 (g/dL)    HCT 78.2  95.6 - 21.3 (%)    MCV 95.3  78.0 - 100.0 (fL)    MCH 30.4  26.0 - 34.0 (pg)    MCHC 31.9  30.0 - 36.0 (g/dL)    RDW 08.6  57.8 - 46.9 (%)    Platelets 259  150 - 400 (K/uL)   DIFFERENTIAL     Status: Abnormal   Collection Time   02/18/11  4:10 PM      Component Value Range Comment   Neutrophils Relative 49  43 -  77 (%)    Neutro Abs 5.9  1.7 - 7.7 (K/uL)    Lymphocytes Relative 31  12 - 46 (%)    Lymphs Abs 3.8  0.7 - 4.0 (K/uL)    Monocytes Relative 14 (*) 3 - 12 (%)    Monocytes Absolute 1.8 (*) 0.1 - 1.0 (K/uL)    Eosinophils Relative 5  0 - 5 (%)    Eosinophils Absolute 0.7  0.0 - 0.7 (K/uL)    Basophils Relative 0  0 - 1 (%)    Basophils Absolute 0.0  0.0 - 0.1 (K/uL)   COMPREHENSIVE METABOLIC PANEL     Status: Abnormal   Collection Time   02/18/11  4:10 PM      Component Value Range Comment   Sodium 134 (*) 135 - 145 (mEq/L)    Potassium 4.5  3.5 - 5.1 (mEq/L)    Chloride 98  96 - 112 (mEq/L)    CO2 30  19 - 32 (mEq/L)    Glucose, Bld 85  70 - 99 (mg/dL)    BUN 26 (*) 6 - 23 (mg/dL)    Creatinine, Ser 1.61 (*) 0.50 - 1.10 (mg/dL)    Calcium 9.2  8.4 - 10.5 (mg/dL)    Total Protein 7.0  6.0 - 8.3 (g/dL)    Albumin 3.1 (*) 3.5 - 5.2 (g/dL)    AST 19  0 - 37 (U/L)    ALT 11  0 - 35 (U/L)    Alkaline Phosphatase 102  39 - 117 (U/L)    Total  Bilirubin 0.2 (*) 0.3 - 1.2 (mg/dL)    GFR calc non Af Amer 50 (*) >90 (mL/min)    GFR calc Af Amer 58 (*) >90 (mL/min)    Dg Foot Complete Right  02/18/2011  *RADIOLOGY REPORT*  Clinical Data: Heel pressure open wound cellulitis.  Rule out osteomyelitis.  RIGHT FOOT COMPLETE - 3+ VIEW  Comparison: 02/12/2011  Findings: Bones appear osteopenic.  There is soft tissue defect in the heel, overlying the posterior aspect of the calcaneus.  The soft tissue defect appears deeper than on the previous study.  In addition, there is question of cortical disruption adjacent to the soft tissue ulceration which can be seen and early osteomyelitis. Note is made of a plantar calcaneal spur.  There is soft tissue swelling of the forefoot.  No evidence for acute fracture.  IMPRESSION:  1.  Soft tissue ulceration appears more significant. 2.  Question of early osteomyelitis of the calcaneus.  Original Report Authenticated By: Patterson Hammersmith, M.D.    Review of Systems  Constitutional: Positive for malaise/fatigue. Negative for fever and chills.  HENT: Negative for sore throat.   Eyes: Negative for blurred vision and double vision.  Respiratory: Negative for cough, hemoptysis and shortness of breath.   Cardiovascular: Positive for leg swelling. Negative for chest pain, palpitations, orthopnea and PND.  Gastrointestinal: Negative for heartburn, nausea, blood in stool and melena.  Genitourinary: Negative for dysuria, urgency and frequency.  Musculoskeletal: Positive for back pain.  Skin: Negative for itching and rash.  Neurological: Positive for weakness. Negative for dizziness, tingling and headaches.  Endo/Heme/Allergies: Negative.  Negative for environmental allergies and polydipsia. Does not bruise/bleed easily.  Psychiatric/Behavioral: Negative for depression and suicidal ideas.    Blood pressure 104/73, pulse 96, temperature 99 F (37.2 C), temperature source Oral, resp. rate 18, height 5\' 5"  (1.651 m),  weight 204.572 kg (451 lb), SpO2 94.00%. Physical Exam  Constitutional: She is  oriented to person, place, and time. She appears well-developed and well-nourished.       Morbid Obesity  HENT:  Head: Normocephalic and atraumatic.  Right Ear: External ear normal.  Left Ear: External ear normal.  Mouth/Throat: Oropharynx is clear and moist. No oropharyngeal exudate.  Eyes: Conjunctivae and EOM are normal. Pupils are equal, round, and reactive to light. Right eye exhibits no discharge. Left eye exhibits no discharge. No scleral icterus.  Neck: Normal range of motion. Neck supple. No tracheal deviation present. No thyromegaly present.  Cardiovascular: Normal rate, regular rhythm, normal heart sounds and intact distal pulses.  Exam reveals no friction rub.   No murmur heard. Respiratory: Effort normal and breath sounds normal. No respiratory distress. She has no wheezes.  GI: Soft. Bowel sounds are normal. She exhibits no distension. There is no tenderness. There is no rebound and no guarding.  Genitourinary:       Pelvic deferred  Musculoskeletal: Normal range of motion. She exhibits edema.  Neurological: She is alert and oriented to person, place, and time. She has normal reflexes.  Skin: Skin is warm and dry. No rash noted. There is erythema.       Right heal stage II-III with area of necrosis and surrounding erythema. Lynphatic streaking up the right leg with associated erythema up to the knee.  Psychiatric: She has a normal mood and affect. Her behavior is normal. Judgment and thought content normal.     Assessment/Plan  Right heal cellulitis  Right leg lymphangitis  Right heal ulcer.  morbid obesity   Plan: admit to Med/Surg          IV Antibiotics          Wound care consult          Med reconciliation              [facility] 02/18/2011, 7:38 PM

## 2011-02-18 NOTE — ED Notes (Signed)
CONSULT TO WOUND/OSTOMY/CONTINECE IS A DAY SHIFT ACTIVITY, CLICKED OFF BY ACCIDENT

## 2011-02-18 NOTE — Progress Notes (Signed)
ANTIBIOTIC CONSULT NOTE - INITIAL  Pharmacy Consult for Vancomycin/Fortaz Indication: Right heal cellulitis  Allergies  Allergen Reactions  . Sulfa Antibiotics Hives    Patient Measurements: Height: 5\' 5"  (165.1 cm) Weight: 451 lb (204.572 kg) IBW/kg (Calculated) : 57    Vital Signs: Temp: 98.8 F (37.1 C) (11/26 1935) Temp src: Oral (11/26 1935) BP: 109/52 mmHg (11/26 1935) Pulse Rate: 84  (11/26 2100) Intake/Output from previous day:   Intake/Output from this shift:    Labs:  Basename 02/18/11 1610  WBC 12.2*  HGB 11.7*  PLT 259  LABCREA --  CREATININE 1.29*   Estimated Creatinine Clearance: 104 ml/min (by C-G formula based on Cr of 1.29). No results found for this basename: VANCOTROUGH:2,VANCOPEAK:2,VANCORANDOM:2,GENTTROUGH:2,GENTPEAK:2,GENTRANDOM:2,TOBRATROUGH:2,TOBRAPEAK:2,TOBRARND:2,AMIKACINPEAK:2,AMIKACINTROU:2,AMIKACIN:2, in the last 72 hours   Microbiology: No results found for this or any previous visit (from the past 720 hour(s)).  Medical History: Past Medical History  Diagnosis Date  . Diabetes mellitus   . Hypertension   . Wound healing, delayed     Medications:   (Not in a hospital admission) Assessment: 40 y/o female morbidly obese patient admitted with right heal cellulitis requiring broad spectrum antibiotics. Received 1g vanc and 2g fortaz in ED. Crcl 104.  Goal of Therapy:  Vancomycin trough level 10-15 mcg/ml  Plan:  Give additional vancomycin 1g to equal 2g load then 1500mg  IV q8h. Fortaz 2g iv q8h. Will monitor renal fxn.  Measure antibiotic drug levels at steady state  Verlene Mayer, PharmD, [place] Pager [phone]  02/18/2011,9:35 PM

## 2011-02-18 NOTE — ED Notes (Signed)
Patient has clean catch urine sent to lab for keeping 

## 2011-02-18 NOTE — ED Provider Notes (Signed)
History     CSN: [id number] Arrival date & time: 02/18/2011 12:01 PM   First MD Initiated Contact with Patient 02/18/11 1506      Chief Complaint  Patient presents with  . Wound Infection    (Consider location/radiation/quality/duration/timing/severity/associated sxs/prior treatment) Patient is a 40 y.o. female presenting with wound check. The history is provided by the patient.  Wound Check    Patient is bedbound secondary to severe chronic back pain and obesity. She states that she has had a wound to her R posterior heel for the past 3 weeks. This has been getting worse. She has been getting treatment at a wound clinic for this, but has not been on any antibiotics. She states that about 3 days ago, she noticed redness and streaking going up her leg from this area and increased odor and drainage to the wound. The redness has presently spread to about her knee. She has been marking off the progression of the redness with a marker. She states the leg is painful, but has not noticed any swelling. She denies fever but has been feeling some malaise for the past several days. Denies any numbness, tingling, weakness in the leg or foot. She does have PMH of DM but states her BGs are well controlled, usually <100.  Past Medical History  Diagnosis Date  . Diabetes mellitus   . Hypertension   . Wound healing, delayed     Past Surgical History  Procedure Date  . Cholecystectomy   . Back surgery     for lumbar disc disease X2  . Brain surgery 1970  . Right elbow     Family History  Problem Relation Age of Onset  . Diabetes type II Father   . Diabetes type II Mother     History  Substance Use Topics  . Smoking status: Former Games developer  . Smokeless tobacco: Not on file  . Alcohol Use: No    OB History    Grav Para Term Preterm Abortions TAB SAB Ect Mult Living                  Review of Systems  Constitutional: Negative for fever, chills and appetite change.  HENT: Negative.     Eyes: Negative.   Respiratory: Negative for cough and shortness of breath.   Cardiovascular: Negative for chest pain, palpitations and leg swelling.  Gastrointestinal: Negative for nausea, vomiting and abdominal pain.  Musculoskeletal: Positive for myalgias.  Skin: Positive for color change and wound.  Neurological: Negative for weakness and numbness.    Allergies  Sulfa antibiotics  Home Medications   Current Outpatient Rx  Name Route Sig Dispense Refill  . ATORVASTATIN CALCIUM 10 MG PO TABS Oral Take 10 mg by mouth daily.      Marland Kitchen BENAZEPRIL HCL 10 MG PO TABS Oral Take 10 mg by mouth daily.      Marland Kitchen BIOTIN 5000 MCG PO CAPS Oral Take 2 capsules by mouth 2 (two) times daily.      . CELECOXIB 400 MG PO CAPS Oral Take 400 mg by mouth 2 (two) times daily.      Marland Kitchen CITALOPRAM HYDROBROMIDE 20 MG PO TABS Oral Take 20 mg by mouth daily.      . COLLAGENASE 250 UNIT/GM EX OINT Topical Apply 1 application topically daily.      Marland Kitchen VITAMIN B-12 IJ Injection Inject 100 Units as directed every 30 (thirty) days.      Marland Kitchen DIAZEPAM 5 MG PO TABS Oral  Take 10 mg by mouth 2 (two) times daily as needed. Anxiety/sleep      . ESOMEPRAZOLE MAGNESIUM 40 MG PO CPDR Oral Take 40 mg by mouth daily before breakfast.      . FENTANYL 100 MCG/HR TD PT72 Transdermal Place 5 patches onto the skin every 3 (three) days. First put on 11/24     . GABAPENTIN 300 MG PO CAPS Oral Take 300 mg by mouth at bedtime.      Marland Kitchen GARLIC PO Oral Take 1 tablet by mouth daily.      Marland Kitchen HYDROMORPHONE HCL 8 MG PO TABS Oral Take 8 mg by mouth every 4 (four) hours as needed. Pain      . INSULIN GLARGINE 100 UNIT/ML [place] SOLN Subcutaneous Inject 85 Units into the skin at bedtime.      . IRON COMPLEX PO Oral Take 2 tablets by mouth 2 (two) times daily.      Marland Kitchen LEVOTHYROXINE SODIUM 112 MCG PO TABS Oral Take 112 mcg by mouth daily.      Marland Kitchen LIRAGLUTIDE 18 MG/3ML [place] SOLN Subcutaneous Inject 1.8 mg into the skin daily.      Marland Kitchen METOCLOPRAMIDE HCL 10 MG PO TABS  Oral Take 10 mg by mouth every 8 (eight) hours.      . MOMETASONE FUROATE 50 MCG/ACT NA SUSP Nasal Place 2 sprays into the nose daily.      Carma Leaven M PLUS PO TABS Oral Take 1 tablet by mouth daily.      . OXYCODONE-ACETAMINOPHEN 10-325 MG PO TABS Oral Take 1 tablet by mouth every 4 (four) hours. pain     . PREGABALIN 75 MG PO CAPS Oral Take 75 mg by mouth 3 (three) times daily.      Marland Kitchen PSEUDOEPHEDRINE-IBUPROFEN 30-200 MG PO CAPS Oral Take 1 capsule by mouth every 6 (six) hours as needed. could     . SITAGLIPTIN-METFORMIN HCL 50-1000 MG PO TABS Oral Take 1 tablet by mouth 2 (two) times daily with a meal.      . VITAMIN D (CHOLECALCIFEROL) PO Oral Take 1 capsule by mouth daily.        BP 104/73  Pulse 96  Temp(Src) 99 F (37.2 C) (Oral)  Resp 18  SpO2 94%  Physical Exam  Nursing note and vitals reviewed. Constitutional: She is oriented to person, place, and time. She appears well-developed and well-nourished. No distress.       Large body habitus  HENT:  Head: Normocephalic and atraumatic.  Right Ear: External ear normal.  Left Ear: External ear normal.  Eyes: Conjunctivae are normal. Pupils are equal, round, and reactive to light.  Neck: Normal range of motion.  Cardiovascular: Normal rate, regular rhythm and normal heart sounds.   Pulmonary/Chest: Effort normal and breath sounds normal.  Abdominal: Soft.  Musculoskeletal: Normal range of motion.  Neurological: She is alert and oriented to person, place, and time.  Skin: Skin is warm and dry. She is not diaphoretic. There is erythema.       Wound to the posterior calcaneus, right. The area measures approximately 5-6 cm across. It is draining serosanguinous/purulent material, and the center appears somewhat worrisome for necrosis. Odor from the wound is consistent with Pseudomonas infection. Red streaking ascending the leg extending to approximately the knee. Extent of streaking was marked off and dated with a surgical marker. The foot  is neurovascularly intact with sensory intact to light touch. Cap refill less than 3 seconds. Pedal pulses intact.  Psychiatric:  She has a normal mood and affect.    ED Course  Procedures (including critical care time)  Labs Reviewed  CBC - Abnormal; Notable for the following:    WBC 12.2 (*)    RBC 3.85 (*)    Hemoglobin 11.7 (*)    All other components within normal limits  DIFFERENTIAL - Abnormal; Notable for the following:    Monocytes Relative 14 (*)    Monocytes Absolute 1.8 (*)    All other components within normal limits  COMPREHENSIVE METABOLIC PANEL - Abnormal; Notable for the following:    Sodium 134 (*)    BUN 26 (*)    Creatinine, Ser 1.29 (*)    Albumin 3.1 (*)    Total Bilirubin 0.2 (*)    GFR calc non Af Amer 50 (*)    GFR calc Af Amer 58 (*)    All other components within normal limits  CBC  BASIC METABOLIC PANEL  POCT CBG MONITORING  HEMOGLOBIN A1C  POCT CBG MONITORING   Dg Foot Complete Right  02/18/2011  *RADIOLOGY REPORT*  Clinical Data: Heel pressure open wound cellulitis.  Rule out osteomyelitis.  RIGHT FOOT COMPLETE - 3+ VIEW  Comparison: 02/12/2011  Findings: Bones appear osteopenic.  There is soft tissue defect in the heel, overlying the posterior aspect of the calcaneus.  The soft tissue defect appears deeper than on the previous study.  In addition, there is question of cortical disruption adjacent to the soft tissue ulceration which can be seen and early osteomyelitis. Note is made of a plantar calcaneal spur.  There is soft tissue swelling of the forefoot.  No evidence for acute fracture.  IMPRESSION:  1.  Soft tissue ulceration appears more significant. 2.  Question of early osteomyelitis of the calcaneus.  Original Report Authenticated By: Patterson Hammersmith, M.D.   I personally reviewed the patient's films.   1. Ascending lymphangitis   2. Cellulitis       MDM  Patient's leg appears concerning for possible ascending  lymphangitis/cellulitis. X-ray, which was reviewed by myself, indicates very questionable early osteomyelitis of the calcaneus. The odor of the wound is consistent with Pseudomonas infection. She is nontoxic appearing, vital signs are stable. White count is very modestly elevated at 12.2. She has been given 2 mg of Fortaz and 1 mg of vancomycin while in the department. She has been admitted to the hospitalist team for further evaluation and management.     Grant Fontana, [place] 02/19/11 [phone]

## 2011-02-18 NOTE — ED Notes (Signed)
ZOX:[id number]<VW> Expected date:02/18/11<BR> Expected time:11:23 AM<BR> Means of arrival:Ambulance<BR> Comments:<BR> P35 40yoF-open wound she has been treated for.  Can go through triage/wheelchair if needed

## 2011-02-18 NOTE — ED Provider Notes (Signed)
Patient with ulcer left heel for several weeks. Has noticed red streaks going up her leg for the past 3 days. No treatment prior to coming here. On exam patient alert Glasgow Coma Score 15 nontoxic has ulcer at posterior right heel there is a red streak approximately 3/4 of the way up the right shin suggesting a ascending lymphangitis.  Doug Sou, MD 02/18/11 1650

## 2011-02-18 NOTE — Progress Notes (Signed)
PHARMACIST - PHYSICIAN ORDER COMMUNICATION  CONCERNING: P&T Medication Policy on Herbal Medications  DESCRIPTION:  This patient's order for:  Biotin  has been noted.  This product(s) is classified as an "herbal" or natural product. Due to a lack of definitive safety studies or FDA approval, nonstandard manufacturing practices, plus the potential risk of unknown drug-drug interactions while on inpatient medications, the Pharmacy and Therapeutics Committee does not permit the use of "herbal" or natural products of this type within [facility].   ACTION TAKEN: The pharmacy department is unable to verify this order at this time and your patient has been informed of this safety policy. Please reevaluate patient's clinical condition at discharge and address if the herbal or natural product(s) should be resumed at that time.  Lorenza Evangelist 02/18/2011 11:05 PM
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
## 2011-02-19 ENCOUNTER — Inpatient Hospital Stay (HOSPITAL_COMMUNITY): Payer: Medicare Other

## 2011-02-19 ENCOUNTER — Encounter (HOSPITAL_COMMUNITY): Payer: Self-pay

## 2011-02-19 DIAGNOSIS — E11621 Type 2 diabetes mellitus with foot ulcer: Secondary | ICD-10-CM | POA: Diagnosis present

## 2011-02-19 DIAGNOSIS — L97409 Non-pressure chronic ulcer of unspecified heel and midfoot with unspecified severity: Secondary | ICD-10-CM | POA: Diagnosis present

## 2011-02-19 DIAGNOSIS — IMO0002 Reserved for concepts with insufficient information to code with codable children: Secondary | ICD-10-CM | POA: Diagnosis present

## 2011-02-19 LAB — GLUCOSE, CAPILLARY
Glucose-Capillary: 143 mg/dL — ABNORMAL HIGH (ref 70–99)
Glucose-Capillary: 108 mg/dL — ABNORMAL HIGH (ref 70–99)
Glucose-Capillary: 120 mg/dL — ABNORMAL HIGH (ref 70–99)
Glucose-Capillary: 105 mg/dL — ABNORMAL HIGH (ref 70–99)
Glucose-Capillary: 114 mg/dL — ABNORMAL HIGH (ref 70–99)

## 2011-02-19 LAB — BASIC METABOLIC PANEL
BUN: 29 mg/dL — ABNORMAL HIGH (ref 6–23)
CO2: 28 mEq/L (ref 19–32)
Calcium: 8.9 mg/dL (ref 8.4–10.5)
Chloride: 98 mEq/L (ref 96–112)
Creatinine, Ser: 1.34 mg/dL — ABNORMAL HIGH (ref 0.50–1.10)
GFR calc Af Amer: 56 mL/min — ABNORMAL LOW (ref >90)
GFR calc non Af Amer: 48 mL/min — ABNORMAL LOW (ref >90)
Glucose, Bld: 157 mg/dL — ABNORMAL HIGH (ref 70–99)
Potassium: 4.5 mEq/L (ref 3.5–5.1)
Sodium: 134 mEq/L — ABNORMAL LOW (ref 135–145)

## 2011-02-19 LAB — CBC
HCT: 35.3 % — ABNORMAL LOW (ref 36.0–46.0)
Hemoglobin: 10.9 g/dL — ABNORMAL LOW (ref 12.0–15.0)
MCH: 30 pg (ref 26.0–34.0)
MCHC: 30.9 g/dL (ref 30.0–36.0)
MCV: 97.2 fL (ref 78.0–100.0)
Platelets: 257 10*3/uL (ref 150–400)
RBC: 3.63 MIL/uL — ABNORMAL LOW (ref 3.87–5.11)
RDW: 13.1 % (ref 11.5–15.5)
WBC: 10.6 10*3/uL — ABNORMAL HIGH (ref 4.0–10.5)

## 2011-02-19 LAB — HEMOGLOBIN A1C
Hgb A1c MFr Bld: 6.9 % — ABNORMAL HIGH (ref <5.7)
Mean Plasma Glucose: 151 mg/dL — ABNORMAL HIGH (ref <117)

## 2011-02-19 MED ORDER — CARISOPRODOL 350 MG PO TABS
350.0000 mg | ORAL_TABLET | ORAL | Status: DC
  Administered 2011-02-19 – 2011-02-20 (×5): 350 mg via ORAL
  Filled 2011-02-19 (×6): qty 1

## 2011-02-19 MED ORDER — FENTANYL 100 MCG/HR TD PT72
400.0000 ug | MEDICATED_PATCH | TRANSDERMAL | Status: DC
  Administered 2011-02-19 – 2011-02-25 (×3): 400 ug via TRANSDERMAL
  Administered 2011-02-28: 200 ug via TRANSDERMAL
  Administered 2011-03-03 – 2011-03-06 (×2): 400 ug via TRANSDERMAL
  Filled 2011-02-19 (×4): qty 4
  Filled 2011-02-19: qty 1
  Filled 2011-02-19: qty 2
  Filled 2011-02-19: qty 4
  Filled 2011-02-19: qty 2

## 2011-02-19 MED ORDER — LIRAGLUTIDE 18 MG/3ML ~~LOC~~ SOLN
1.8000 mg | Freq: Every day | SUBCUTANEOUS | Status: DC
  Administered 2011-02-21 – 2011-03-08 (×15): 1.8 mg via SUBCUTANEOUS
  Filled 2011-02-19: qty 0.3

## 2011-02-19 MED ORDER — NON FORMULARY: 350.0000 mg | Status: DC

## 2011-02-19 MED ORDER — NYSTATIN 100000 UNIT/GM EX POWD
Freq: Two times a day (BID) | CUTANEOUS | Status: DC
  Administered 2011-02-19 – 2011-03-08 (×34): via TOPICAL
  Filled 2011-02-19 (×6): qty 15

## 2011-02-19 NOTE — Op Note (Signed)
NAMEBLYSS, LUGAR NO.:  [id number]  MEDICAL RECORD NO.:  [id number]  LOCATION:  1505                         FACILITY:  [facility]  PHYSICIAN:  Burnard Bunting, M.D.    DATE OF BIRTH:  12-11-1970  DATE OF PROCEDURE: DATE OF DISCHARGE:                              OPERATIVE REPORT   [place], the patient with right heel ulcer.  She has a very little sensation on the plantar aspect of her heel.  She has a heel ulcer, full thickness, with half of the area covered by some necrotic tissues.  This was debrided back sharply and covered with a wet-to-dry normal saline dressing.  MRI scan is pending.  Wound VAC care consult is ordered.  We will place the wound VAC on the heel after her scan.  The patient tolerated procedure well.  Time-out was called although it was obvious that the foot involved was the foot that required the procedure.     Burnard Bunting, M.D.     GSD/MEDQ  D:  02/19/2011  T:  02/19/2011  Job:  [id number]

## 2011-02-19 NOTE — Consult Note (Signed)
WOC consult Note Reason for Consult: Consult requested by Dr August Saucer for [facility] dressing change in ER. He plans to debride right heel and plans for possible VAC to site tomorrow.  He will be following right heel for further plan of care. Pt has had VAC to right ischium chronic stage 4 wound at home prior to admission. Followed as outpatient by surgeon and appointment was due for assessment tomorrow in their office. Patient and husband states the dressing is due to be changed today.  [place] VAC on at 125 cm con't suction. Patient is followed by outpatient wound care center. Wound type: Stage 4 Pressure Ulcer POA: Yes Measurement: 2X1.5X6cm.  Difficult to visualize wound bed r/t narrow tunneling opening.  One piece white foam applied over bridge of black foam to 125cm con't suction with track pad.  Bone palpable, large amt tan drainage in cannister.  Denies c/o pain with dressing change. VAC dressing will need to be changed Friday.  Cammie Mcgee, RN, MSN, [employer]  [phone]

## 2011-02-19 NOTE — Progress Notes (Signed)
Pt. Unable to get mri because of weight limitations Dr. Jomarie Longs made aware.

## 2011-02-19 NOTE — Consult Note (Signed)
Reason for Consult:r foot infection Referring Physician:Dr Tyianna Menefee is an 40 y.o. female.  HPI: Temporally [name] 40 year old female admitted for evaluation of right foot infection by the medical service. The patient describes having the foot ulceration start approximately once again. His been progressive and has been treated by wound care specialist at an outside institution. She presents now because infection the right foot has been progressive and no streaking up her leg. She denies any fever or chills or systemic symptoms of illness. The patient has had back surgery and since that time had dropfoot and decreased sensation on the right foot. Patient has also had decreased sensation in both feet because of her diabetes. The patient currently has a wound VAC on the right hip this is being treated by another physician. The patient is a mature with an AFO but to a limited degree her fianc is present for discussion of the treatment plan. Her records are used for this consultation.  Past Medical History  Diagnosis Date  . Diabetes mellitus   . Hypertension   . Wound healing, delayed   . H/O: Bell's palsy   . Depression   . Fibromyalgia   . DJD (degenerative joint disease)   . Headache   . Asthma   . Right foot drop   . Hypoventilation associated with obesity syndrome   . Hypothyroidism   . Back pain     Past Surgical History  Procedure Date  . Cholecystectomy   . Back surgery     for lumbar disc disease X2  . Brain surgery 1970  . Right elbow     Family History  Problem Relation Age of Onset  . Diabetes type II Father   . Diabetes type II Mother     Social History:  reports that she has quit smoking. She has never used smokeless tobacco. She reports that she does not drink alcohol or use illicit drugs.  Allergies:  Allergies  Allergen Reactions  . Sulfa Antibiotics Hives    Medications: I have reviewed the patient's current medications.  Results for  orders placed during the hospital encounter of 02/18/11 (from the past 48 hour(s))  HEMOGLOBIN A1C     Status: Abnormal   Collection Time   02/18/11  4:00 PM      Component Value Range Comment   Hemoglobin A1C 6.9 (*) <5.7 (%)    Mean Plasma Glucose 151 (*) <117 (mg/dL)   CBC     Status: Abnormal   Collection Time   02/18/11  4:10 PM      Component Value Range Comment   WBC 12.2 (*) 4.0 - 10.5 (K/uL)    RBC 3.85 (*) 3.87 - 5.11 (MIL/uL)    Hemoglobin 11.7 (*) 12.0 - 15.0 (g/dL)    HCT 16.1  09.6 - 04.5 (%)    MCV 95.3  78.0 - 100.0 (fL)    MCH 30.4  26.0 - 34.0 (pg)    MCHC 31.9  30.0 - 36.0 (g/dL)    RDW 40.9  81.1 - 91.4 (%)    Platelets 259  150 - 400 (K/uL)   DIFFERENTIAL     Status: Abnormal   Collection Time   02/18/11  4:10 PM      Component Value Range Comment   Neutrophils Relative 49  43 - 77 (%)    Neutro Abs 5.9  1.7 - 7.7 (K/uL)    Lymphocytes Relative 31  12 - 46 (%)    Lymphs  Abs 3.8  0.7 - 4.0 (K/uL)    Monocytes Relative 14 (*) 3 - 12 (%)    Monocytes Absolute 1.8 (*) 0.1 - 1.0 (K/uL)    Eosinophils Relative 5  0 - 5 (%)    Eosinophils Absolute 0.7  0.0 - 0.7 (K/uL)    Basophils Relative 0  0 - 1 (%)    Basophils Absolute 0.0  0.0 - 0.1 (K/uL)   COMPREHENSIVE METABOLIC PANEL     Status: Abnormal   Collection Time   02/18/11  4:10 PM      Component Value Range Comment   Sodium 134 (*) 135 - 145 (mEq/L)    Potassium 4.5  3.5 - 5.1 (mEq/L)    Chloride 98  96 - 112 (mEq/L)    CO2 30  19 - 32 (mEq/L)    Glucose, Bld 85  70 - 99 (mg/dL)    BUN 26 (*) 6 - 23 (mg/dL)    Creatinine, Ser 9.14 (*) 0.50 - 1.10 (mg/dL)    Calcium 9.2  8.4 - 10.5 (mg/dL)    Total Protein 7.0  6.0 - 8.3 (g/dL)    Albumin 3.1 (*) 3.5 - 5.2 (g/dL)    AST 19  0 - 37 (U/L)    ALT 11  0 - 35 (U/L)    Alkaline Phosphatase 102  39 - 117 (U/L)    Total Bilirubin 0.2 (*) 0.3 - 1.2 (mg/dL)    GFR calc non Af Amer 50 (*) >90 (mL/min)    GFR calc Af Amer 58 (*) >90 (mL/min)   GLUCOSE,  CAPILLARY     Status: Abnormal   Collection Time   02/19/11  1:55 AM      Component Value Range Comment   Glucose-Capillary 143 (*) 70 - 99 (mg/dL)    Comment 1 Documented in Chart      Comment 2 Notify RN     CBC     Status: Abnormal   Collection Time   02/19/11  5:00 AM      Component Value Range Comment   WBC 10.6 (*) 4.0 - 10.5 (K/uL)    RBC 3.63 (*) 3.87 - 5.11 (MIL/uL)    Hemoglobin 10.9 (*) 12.0 - 15.0 (g/dL)    HCT 78.2 (*) 95.6 - 46.0 (%)    MCV 97.2  78.0 - 100.0 (fL)    MCH 30.0  26.0 - 34.0 (pg)    MCHC 30.9  30.0 - 36.0 (g/dL)    RDW 21.3  08.6 - 57.8 (%)    Platelets 257  150 - 400 (K/uL)   BASIC METABOLIC PANEL     Status: Abnormal   Collection Time   02/19/11  5:00 AM      Component Value Range Comment   Sodium 134 (*) 135 - 145 (mEq/L)    Potassium 4.5  3.5 - 5.1 (mEq/L)    Chloride 98  96 - 112 (mEq/L)    CO2 28  19 - 32 (mEq/L)    Glucose, Bld 157 (*) 70 - 99 (mg/dL)    BUN 29 (*) 6 - 23 (mg/dL)    Creatinine, Ser 4.69 (*) 0.50 - 1.10 (mg/dL)    Calcium 8.9  8.4 - 10.5 (mg/dL)    GFR calc non Af Amer 48 (*) >90 (mL/min)    GFR calc Af Amer 56 (*) >90 (mL/min)   GLUCOSE, CAPILLARY     Status: Abnormal   Collection Time   02/19/11  8:31 AM  Component Value Range Comment   Glucose-Capillary 108 (*) 70 - 99 (mg/dL)   GLUCOSE, CAPILLARY     Status: Abnormal   Collection Time   02/19/11 11:40 AM      Component Value Range Comment   Glucose-Capillary 120 (*) 70 - 99 (mg/dL)     Dg Foot Complete Right  02/18/2011  *RADIOLOGY REPORT*  Clinical Data: Heel pressure open wound cellulitis.  Rule out osteomyelitis.  RIGHT FOOT COMPLETE - 3+ VIEW  Comparison: 02/12/2011  Findings: Bones appear osteopenic.  There is soft tissue defect in the heel, overlying the posterior aspect of the calcaneus.  The soft tissue defect appears deeper than on the previous study.  In addition, there is question of cortical disruption adjacent to the soft tissue ulceration which  can be seen and early osteomyelitis. Note is made of a plantar calcaneal spur.  There is soft tissue swelling of the forefoot.  No evidence for acute fracture.  IMPRESSION:  1.  Soft tissue ulceration appears more significant. 2.  Question of early osteomyelitis of the calcaneus.  Original Report Authenticated By: Patterson Hammersmith, M.D.    Review of Systems  Constitutional: Negative.   HENT: Negative.   Eyes: Negative.   Respiratory: Negative.   Cardiovascular: Negative.   Genitourinary: Negative.   Musculoskeletal: Positive for joint pain.  Skin: Positive for rash.  Neurological: Negative.   Endo/Heme/Allergies: Negative.   Psychiatric/Behavioral: Negative.    Blood pressure 95/47, pulse 83, temperature 98.4 F (36.9 C), temperature source Oral, resp. rate 19, height 5\' 5"  (1.651 m), weight 204.572 kg (451 lb), SpO2 96.00%. Physical Exam  Constitutional: She is oriented to person, place, and time. She appears well-developed and well-nourished.  HENT:  Head: Normocephalic and atraumatic.  Right Ear: External ear normal.  Left Ear: External ear normal.  Eyes: Conjunctivae and EOM are normal. Pupils are equal, round, and reactive to light.  Neck: Normal range of motion. Neck supple.  Cardiovascular: Regular rhythm and intact distal pulses.   Respiratory: Effort normal.  GI: Soft.  Neurological: She is alert and oriented to person, place, and time.  Skin: Skin is warm.  Psychiatric: She has a normal mood and affect. Thought content normal.   on physical examination Akeema is morbidly obese her vital signs are stable. She has no active dorsiflexion on the right active dorsiflexion is present on the left she has a calcaneal ulceration on the right measuring 5 x 5 measuring 5 x 6 cm. This is full thickness but does not immediately probed to bone. After the areas covered by viable eschar the other half is Covered by nonviable and necrotic appearing tissue. There is red streaking going up  the right leg and calf to the knee. Right knee has no effusion intact extensor mechanism stable collateral crucial ligaments she has no  tissue crepitus on the right. Left is also examined she has palpable pedal pulses bilaterally but significantly decreased sensation on the right plantar and dorsal surface compared to the left. There is history function is intact posterior motor function is intact of the left foot show he has weak plantar flexion of the right foot.  Assessment/Plan: Ms. 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 Plan is for her surgical debridement dry dressing MRI scanning to confirm calcaneal osteomyelitis. If this is the case then had debridement is unlikely to achieve resolution to the problem. She may very well need below knee education. More concerning is the potentially  systemic nature of the infection at this time but by the streaking going up the leg to the knee. There is no tissue crepitus and so the necrotizing fasciitis is not a concern at this time. This is a potentially limb threatening infection and made more complicated by multiple medical comorbidities. We'll plan for debridement, soft dressing, MRI scanning today possible surgical intervention later this week pending the results of the MRI scan. Medical decision making complicated today by a limb threatening nature of the infection as well as the potential for surgical intervention.  DEAN,GREGORY SCOTT 02/19/2011, 3:50 PM

## 2011-02-19 NOTE — ED Provider Notes (Signed)
Medical screening examination/treatment/procedure(s) were conducted as a shared visit with non-physician practitioner(s) and myself.  I personally evaluated the patient during the encounter  Doug Sou, MD 02/19/11 [phone]

## 2011-02-19 NOTE — Progress Notes (Signed)
Subjective: Requesting pain medications,  red streaks on left legbetter already Objective: Vital signs in last 24 hours: Temp:  [97.6 F (36.4 C)-99 F (37.2 C)] 97.6 F (36.4 C) (11/27 0806) Pulse Rate:  [83-96] 86  (11/27 0806) Resp:  [12-20] 19  (11/27 0324) BP: (91-112)/(52-73) 106/62 mmHg (11/27 0806) SpO2:  [94 %-99 %] 99 % (11/27 0806) Weight:  [204.572 kg (451 lb)] 451 lb (204.572 kg) (11/26 1500) Weight change:      Physical Exam: General: Morbidly obese, Alert, awake, oriented x3, in no acute distress. HEENT: neck obese, No bruits, no goiter. Heart: Regular rate and rhythm, without murmurs, rubs, gallops. Lungs: Clear to auscultation bilaterally. Abdomen: Soft, obese, nontender, nondistended, positive bowel sounds, ventral hernia Right Hip: small wound vac Extremities: Left heel with large open wound, slight purulence at base   Lab Results: Basic Metabolic Panel:  Basename 02/19/11 0500 02/18/11 1610  NA 134* 134*  K 4.5 4.5  CL 98 98  CO2 28 30  GLUCOSE 157* 85  BUN 29* 26*  CREATININE 1.34* 1.29*  CALCIUM 8.9 9.2  MG -- --  PHOS -- --   Liver Function Tests:  Basename 02/18/11 1610  AST 19  ALT 11  ALKPHOS 102  BILITOT 0.2*  PROT 7.0  ALBUMIN 3.1*   No results found for this basename: LIPASE:2,AMYLASE:2 in the last 72 hours No results found for this basename: AMMONIA:2 in the last 72 hours CBC:  Basename 02/19/11 0500 02/18/11 1610  WBC 10.6* 12.2*  NEUTROABS -- 5.9  HGB 10.9* 11.7*  HCT 35.3* 36.7  MCV 97.2 95.3  PLT 257 259   Cardiac Enzymes: No results found for this basename: CKTOTAL:3,CKMB:3,CKMBINDEX:3,TROPONINI:3 in the last 72 hours BNP: No results found for this basename: POCBNP:3 in the last 72 hours D-Dimer: No results found for this basename: DDIMER:2 in the last 72 hours CBG:  Basename 02/19/11 0155  GLUCAP 143*   Hemoglobin A1C: No results found for this basename: HGBA1C in the last 72 hours Fasting Lipid Panel: No  results found for this basename: CHOL,HDL,LDLCALC,TRIG,CHOLHDL,LDLDIRECT in the last 72 hours Thyroid Function Tests: No results found for this basename: TSH,T4TOTAL,FREET4,T3FREE,THYROIDAB in the last 72 hours Anemia Panel: No results found for this basename: VITAMINB12,FOLATE,FERRITIN,TIBC,IRON,RETICCTPCT in the last 72 hours Coagulation: No results found for this basename: LABPROT:2,INR:2 in the last 72 hours Urine Drug Screen: Drugs of Abuse     Component Value Date/Time   LABOPIA POSITIVE* 04/21/2008 0031   COCAINSCRNUR NONE DETECTED 04/21/2008 0031   LABBENZ POSITIVE* 04/21/2008 0031   AMPHETMU NONE DETECTED 04/21/2008 0031   THCU NONE DETECTED 04/21/2008 0031   LABBARB  Value: NONE DETECTED        DRUG SCREEN FOR MEDICAL PURPOSES ONLY.  IF CONFIRMATION IS NEEDED FOR ANY PURPOSE, NOTIFY LAB WITHIN 5 DAYS.        LOWEST DETECTABLE LIMITS FOR URINE DRUG SCREEN Drug Class       Cutoff (ng/mL) Amphetamine      1000 Barbiturate      200 Benzodiazepine   200 Tricyclics       300 Opiates          300 Cocaine          300 THC              50 04/21/2008 0031    Alcohol Level: No results found for this basename: ETH:2 in the last 72 hours  No results found for this or any previous visit (from the  past 240 hour(s)).  Studies/Results: Dg Foot Complete Right  02/18/2011  *RADIOLOGY REPORT*  Clinical Data: Heel pressure open wound cellulitis.  Rule out osteomyelitis.  RIGHT FOOT COMPLETE - 3+ VIEW  Comparison: 02/12/2011  Findings: Bones appear osteopenic.  There is soft tissue defect in the heel, overlying the posterior aspect of the calcaneus.  The soft tissue defect appears deeper than on the previous study.  In addition, there is question of cortical disruption adjacent to the soft tissue ulceration which can be seen and early osteomyelitis. Note is made of a plantar calcaneal spur.  There is soft tissue swelling of the forefoot.  No evidence for acute fracture.  IMPRESSION:  1.  Soft tissue  ulceration appears more significant. 2.  Question of early osteomyelitis of the calcaneus.  Original Report Authenticated By: Patterson Hammersmith, M.D.    Medications: Scheduled Meds:   . sodium chloride   Intravenous Once  . benazepril  10 mg Oral Daily  . cefTAZidime (FORTAZ) IV  2 g Intravenous To ER  . cefTAZidime (FORTAZ) IV  2 g Intravenous Q8H  . citalopram  20 mg Oral Daily  . enoxaparin  40 mg Subcutaneous QHS  . fentaNYL  400 mcg Transdermal Q72H  . fluticasone  2 spray Each Nare Daily  . gabapentin  300 mg Oral QHS  . insulin aspart  0-20 Units Subcutaneous TID WC  . insulin aspart  0-20 Units Subcutaneous TID WC  . insulin glargine  85 Units Subcutaneous QHS  . levothyroxine  112 mcg Oral Daily  . linagliptin  5 mg Oral Daily  . Liraglutide  1.8 mg Subcutaneous Daily  . metFORMIN  1,000 mg Oral BID WC  . metoCLOPramide  10 mg Oral Q8H  .  morphine injection  4 mg Intravenous Once  . pregabalin  75 mg Oral TID  . simvastatin  20 mg Oral Daily  . TDaP  0.5 mL Intramuscular Once  . vancomycin  1,500 mg Intravenous Q8H  . vancomycin  1,000 mg Intravenous To ER  . vancomycin  1,000 mg Intravenous Once  . DISCONTD: Biotin  2 capsule Oral BID  . DISCONTD: cefTAZidime (FORTAZ) IV  1 g Intravenous Q8H  . DISCONTD: celecoxib  400 mg Oral BID  . DISCONTD: enoxaparin  40 mg Subcutaneous QHS  . DISCONTD: fentaNYL  500 mcg Transdermal Q72H  . DISCONTD: sitaGLIPtan-metformin  1 tablet Oral BID WC  . DISCONTD: vancomycin  1,000 mg Intravenous Q12H   Continuous Infusions:   . sodium chloride 75 mL/hr at 02/19/11 0557   PRN Meds:.acetaminophen, acetaminophen, diazepam, HYDROmorphone, oxyCODONE, oxyCODONE-acetaminophen, DISCONTD: oxyCODONE-acetaminophen  Assessment/Plan: 1. 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 R hip wound with wound vac : follow up with wound care 5. Chronic pain syndrome: continue home dose of narcotics, i.e Fentanyl patch, DIlaudid    LOS: 1 day   Genevieve Arbaugh 02/19/2011, 8:33 AM

## 2011-02-20 LAB — CBC
HCT: 35.7 % — ABNORMAL LOW (ref 36.0–46.0)
Hemoglobin: 10.8 g/dL — ABNORMAL LOW (ref 12.0–15.0)
MCH: 29.8 pg (ref 26.0–34.0)
MCHC: 30.3 g/dL (ref 30.0–36.0)
MCV: 98.3 fL (ref 78.0–100.0)
Platelets: 253 10*3/uL (ref 150–400)
RBC: 3.63 MIL/uL — ABNORMAL LOW (ref 3.87–5.11)
RDW: 13.1 % (ref 11.5–15.5)
WBC: 9.4 10*3/uL (ref 4.0–10.5)

## 2011-02-20 LAB — VANCOMYCIN, TROUGH: Vancomycin Tr: 45.7 ug/mL (ref 10.0–20.0)

## 2011-02-20 LAB — BASIC METABOLIC PANEL
BUN: 26 mg/dL — ABNORMAL HIGH (ref 6–23)
CO2: 31 mEq/L (ref 19–32)
Calcium: 9 mg/dL (ref 8.4–10.5)
Chloride: 102 mEq/L (ref 96–112)
Creatinine, Ser: 1.31 mg/dL — ABNORMAL HIGH (ref 0.50–1.10)
GFR calc Af Amer: 57 mL/min — ABNORMAL LOW (ref >90)
GFR calc non Af Amer: 49 mL/min — ABNORMAL LOW (ref >90)
Glucose, Bld: 93 mg/dL (ref 70–99)
Potassium: 4.4 mEq/L (ref 3.5–5.1)
Sodium: 139 mEq/L (ref 135–145)

## 2011-02-20 LAB — GLUCOSE, CAPILLARY
Glucose-Capillary: 96 mg/dL (ref 70–99)
Glucose-Capillary: 143 mg/dL — ABNORMAL HIGH (ref 70–99)
Glucose-Capillary: 123 mg/dL — ABNORMAL HIGH (ref 70–99)
Glucose-Capillary: 139 mg/dL — ABNORMAL HIGH (ref 70–99)

## 2011-02-20 LAB — MRSA PCR SCREENING: MRSA by PCR: POSITIVE — AB

## 2011-02-20 MED ORDER — CHLORHEXIDINE GLUCONATE CLOTH 2 % EX PADS
6.0000 | MEDICATED_PAD | Freq: Every day | CUTANEOUS | Status: AC
  Administered 2011-02-21 – 2011-02-25 (×4): 6 via TOPICAL

## 2011-02-20 MED ORDER — MUPIROCIN 2 % EX OINT
1.0000 "application " | TOPICAL_OINTMENT | Freq: Two times a day (BID) | CUTANEOUS | Status: AC
  Administered 2011-02-20 – 2011-02-25 (×10): 1 via NASAL
  Filled 2011-02-20 (×2): qty 22

## 2011-02-20 MED ORDER — CARISOPRODOL 350 MG PO TABS
350.0000 mg | ORAL_TABLET | Freq: Four times a day (QID) | ORAL | Status: DC | PRN
  Administered 2011-02-26 – 2011-03-06 (×7): 350 mg via ORAL
  Filled 2011-02-20 (×7): qty 1

## 2011-02-20 NOTE — Progress Notes (Signed)
UR CHART REVIEWED 

## 2011-02-20 NOTE — Progress Notes (Signed)
Subjective: Patient seen and examined ,C/O right foot pain   Objective: Vital signs in last 24 hours: Temp:  [97.4 F (36.3 C)-98.4 F (36.9 C)] 97.4 F (36.3 C) (11/28 0543) Pulse Rate:  [75-83] 75  (11/28 0543) Resp:  [18-20] 18  (11/28 0543) BP: (95-106)/(47-64) 106/64 mmHg (11/28 0543) SpO2:  [95 %-96 %] 96 % (11/28 0543) Weight change:  Last BM Date: 02/16/11  Intake/Output from previous day: 11/27 0701 - 11/28 0700 In: 1100 [IV Piggyback:1100] Out: -      Physical Exam: General: Alert, awake, oriented x3, in no acute distress. Heart: Regular rate and rhythm, without murmurs, rubs, gallops. Lungs: Clear to auscultation bilaterally. Abdomen: Soft,OBESE, nontender, nondistended, positive bowel sounds. Rt hip wound VAC  Extremities: right foot dressing in place      Lab Results: Results for orders placed during the hospital encounter of 02/18/11 (from the past 24 hour(s))  GLUCOSE, CAPILLARY     Status: Abnormal   Collection Time   02/19/11 11:40 AM      Component Value Range   Glucose-Capillary 120 (*) 70 - 99 (mg/dL)  GLUCOSE, CAPILLARY     Status: Abnormal   Collection Time   02/19/11  5:20 PM      Component Value Range   Glucose-Capillary 105 (*) 70 - 99 (mg/dL)   Comment 1 Notify RN    GLUCOSE, CAPILLARY     Status: Abnormal   Collection Time   02/19/11 10:16 PM      Component Value Range   Glucose-Capillary 114 (*) 70 - 99 (mg/dL)   Comment 1 Notify RN    CBC     Status: Abnormal   Collection Time   02/20/11  5:35 AM      Component Value Range   WBC 9.4  4.0 - 10.5 (K/uL)   RBC 3.63 (*) 3.87 - 5.11 (MIL/uL)   Hemoglobin 10.8 (*) 12.0 - 15.0 (g/dL)   HCT 96.0 (*) 45.4 - 46.0 (%)   MCV 98.3  78.0 - 100.0 (fL)   MCH 29.8  26.0 - 34.0 (pg)   MCHC 30.3  30.0 - 36.0 (g/dL)   RDW 09.8  11.9 - 14.7 (%)   Platelets 253  150 - 400 (K/uL)  BASIC METABOLIC PANEL     Status: Abnormal   Collection Time   02/20/11  5:35 AM      Component Value Range   Sodium 139  135 - 145 (mEq/L)   Potassium 4.4  3.5 - 5.1 (mEq/L)   Chloride 102  96 - 112 (mEq/L)   CO2 31  19 - 32 (mEq/L)   Glucose, Bld 93  70 - 99 (mg/dL)   BUN 26 (*) 6 - 23 (mg/dL)   Creatinine, Ser 8.29 (*) 0.50 - 1.10 (mg/dL)   Calcium 9.0  8.4 - 56.2 (mg/dL)   GFR calc non Af Amer 49 (*) >90 (mL/min)   GFR calc Af Amer 57 (*) >90 (mL/min)  GLUCOSE, CAPILLARY     Status: Normal   Collection Time   02/20/11  8:28 AM      Component Value Range   Glucose-Capillary 96  70 - 99 (mg/dL)   Comment 1 Notify RN      Studies/Results:  Medications:    . benazepril  10 mg Oral Daily  . carisoprodol  350 mg Oral Q4H  . cefTAZidime (FORTAZ)  IV  2 g Intravenous Q8H  . citalopram  20 mg Oral Daily  . enoxaparin  40  mg Subcutaneous QHS  . fentaNYL  400 mcg Transdermal Q72H  . fluticasone  2 spray Each Nare Daily  . gabapentin  300 mg Oral QHS  . insulin aspart  0-20 Units Subcutaneous TID WC  . insulin glargine  85 Units Subcutaneous QHS  . levothyroxine  112 mcg Oral Daily  . linagliptin  5 mg Oral Daily  . Liraglutide  1.8 mg Subcutaneous Daily  . metoCLOPramide  10 mg Oral Q8H  . nystatin   Topical BID  . pregabalin  75 mg Oral TID  . simvastatin  20 mg Oral Daily  . vancomycin  1,500 mg Intravenous Q8H  . vancomycin  1,000 mg Intravenous Once  . DISCONTD: Liraglutide  1.8 mg Subcutaneous Daily  . DISCONTD: NON FORMULARY 350 mg  350 mg Oral Q4H    acetaminophen, acetaminophen, diazepam, HYDROmorphone, oxyCODONE, oxyCODONE-acetaminophen     . sodium chloride 75 mL/hr at 02/19/11 1928    Assessment/Plan:  1. 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 Wound care ,plans for right foot VAC  2.Diabetes mellitus type 2 with complications, : continue lantus, SSI, hold metformin ,will resume diet today 3.Morbid Obesity  4. R hip wound with wound vac : follow  up with wound care  5. Chronic pain syndrome: continue home dose of narcotics, i.e Fentanyl patch, DIlaudid      LOS: 2 days   [name] 02/20/2011, 9:25 AM

## 2011-02-20 NOTE — Progress Notes (Signed)
Mri not possible due to weight Will try for ct scan If this is not possible would use wound vac and follow serial xrays

## 2011-02-20 NOTE — Clinical Documentation Improvement (Signed)
Abnormal Labs Clarification  THIS DOCUMENT IS NOT A PERMANENT PART OF THE MEDICAL RECORD  TO RESPOND TO THE THIS QUERY, FOLLOW THE INSTRUCTIONS BELOW:  1. If needed, update documentation for the patient's encounter via the notes activity.  2. Access this query again and click edit on the [employer].  3. After updating, or not, click F2 to complete all highlighted (required) fields concerning your review. Select "additional documentation in the medical record" OR "no additional documentation provided".  4. Click Sign note button.  5. The deficiency will fall out of your InBasket *Please let us know if you are not able to compete this workflow by phone or e-mail (listed below).  Please update your documentation within the medical record to reflect your response to this query.                                                                                   02/20/11  Dear Dr. Jomarie Longs Marton Redwood  In a better effort to capture your patient's severity of illness, reflect appropriate length of stay and utilization of resources, a review of the medical record has revealed the following indicators.    Based on your clinical judgment, please clarify and document in a progress note and/or discharge summary the clinical condition associated with the following supporting information: In responding to this query please exercise your independent judgment.  The fact that a query is asked, does not imply that any particular answer is desired or expected.  Abnormal findings (laboratory, x-ray, pathologic, and other diagnostic results) are not coded and reported unless the physician indicates their clinical significance.   The medical record reflects the following clinical findings, please clarify the diagnostic and/or clinical significance:      Please clarify the underlying condition (s) if possible, for the abnormal labs listed below. Thank you for your exceptional documentation.   Other  Condition___________________              Cannot Clinically Determine_________    Clinical Information:   02/18/11 Bun 26 creat 1.29 GFR calc 50  02/19/11 Bun29 creat 1.34 GFR calc 48 Na 134  02/20/11 Bun26 creat 1.31 GFR calc 49     Reviewed:  no additional documentation provided   Thank Barrie Dunker RN   Clinical Documentation Specialist:  Pager [phone]  [facility] Information Management [place]

## 2011-02-20 NOTE — Consult Note (Signed)
WOC consult Note Reason for Consult:right heel requires negative pressure wound therapy per MD request Wound type: pressure ulcer-full thickness Pressure Ulcer POA: Yes Measurement:4cm x 6cm area with dark, necrotic center measuring 3x3cm  Unable to stage at this time due to the presence of eschar. Wound bed: 60% clean, pink, granulating; 40% necrotic Drainage (amount, consistency, odor) serosanguinous Periwound:intact Dressing procedure/placement/frequency: NPWT,  M-W-F.  Use "y" connector so that one pump can be used for both dressings. OUr team will follow as needed to support staff with NPWT dressings. Ladona Mow, MSN, RN, GNP, CWOCN [phone])

## 2011-02-21 ENCOUNTER — Inpatient Hospital Stay (HOSPITAL_COMMUNITY): Payer: Medicare Other

## 2011-02-21 LAB — CBC
HCT: 35.8 % — ABNORMAL LOW (ref 36.0–46.0)
Hemoglobin: 10.8 g/dL — ABNORMAL LOW (ref 12.0–15.0)
MCH: 29.9 pg (ref 26.0–34.0)
MCHC: 30.2 g/dL (ref 30.0–36.0)
MCV: 99.2 fL (ref 78.0–100.0)
Platelets: 253 10*3/uL (ref 150–400)
RBC: 3.61 MIL/uL — ABNORMAL LOW (ref 3.87–5.11)
RDW: 13 % (ref 11.5–15.5)
WBC: 7.9 10*3/uL (ref 4.0–10.5)

## 2011-02-21 LAB — BASIC METABOLIC PANEL
BUN: 20 mg/dL (ref 6–23)
CO2: 30 mEq/L (ref 19–32)
Calcium: 8.8 mg/dL (ref 8.4–10.5)
Chloride: 105 mEq/L (ref 96–112)
Creatinine, Ser: 0.92 mg/dL (ref 0.50–1.10)
GFR calc Af Amer: 88 mL/min — ABNORMAL LOW (ref >90)
GFR calc non Af Amer: 76 mL/min — ABNORMAL LOW (ref >90)
Glucose, Bld: 111 mg/dL — ABNORMAL HIGH (ref 70–99)
Potassium: 4.7 mEq/L (ref 3.5–5.1)
Sodium: 140 mEq/L (ref 135–145)

## 2011-02-21 LAB — GLUCOSE, CAPILLARY
Glucose-Capillary: 120 mg/dL — ABNORMAL HIGH (ref 70–99)
Glucose-Capillary: 229 mg/dL — ABNORMAL HIGH (ref 70–99)
Glucose-Capillary: 123 mg/dL — ABNORMAL HIGH (ref 70–99)
Glucose-Capillary: 139 mg/dL — ABNORMAL HIGH (ref 70–99)

## 2011-02-21 IMAGING — CT CT ANKLE*R* W/O CM
2 of 5 series · 6 of 20 positions shown, 7 images · IV contrast (agent unspecified)
Comparison: Radiographs [DATE] and [DATE].

CLINICAL DATA: Right ankle and hip wounds.  Evaluate for calcaneal
osteomyelitis.

CT OF THE RIGHT ANKLE WITH CONTRAST
TECHNIQUE: Multidetector CT imaging was performed following the
standard protocol during bolus administration of intravenous
contrast.

[Series 5: sml low ext uhr 2.0 u90u · axial · 0.32mm/px · z∈[+120,+188]mm · 3 of 69 slices shown, 4 images]
[im 18/69  soft-tissue]
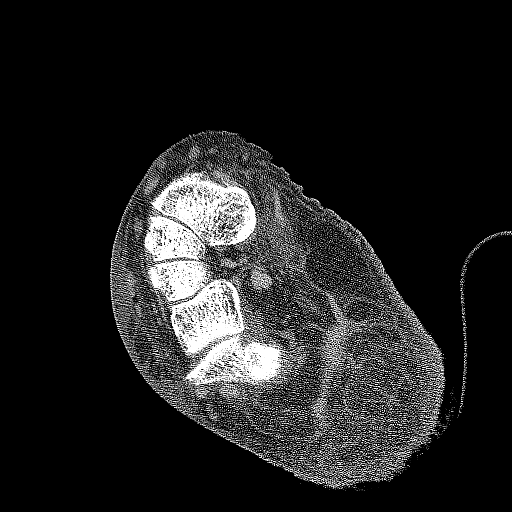
[im 18/69  bone]
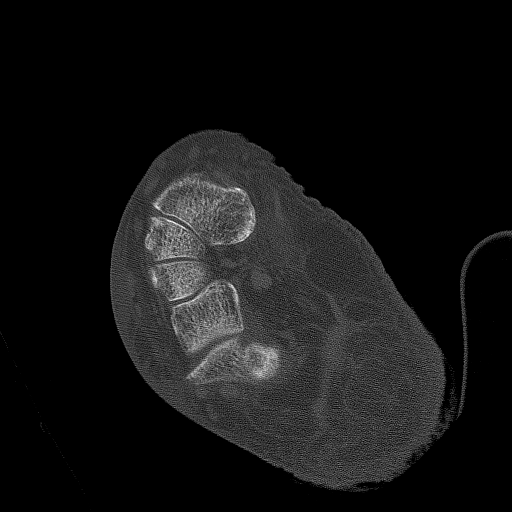
[im 35/69  bone]
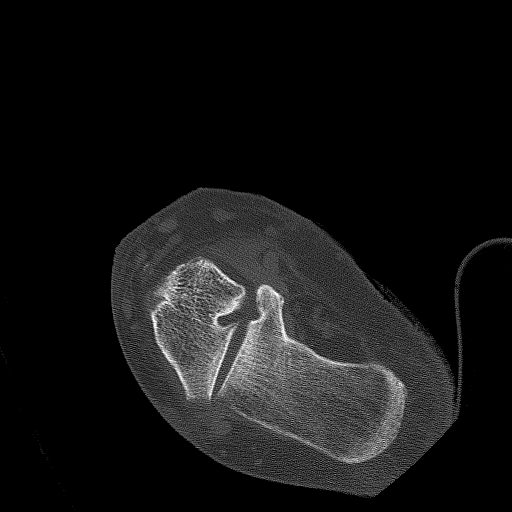
[im 52/69  bone]
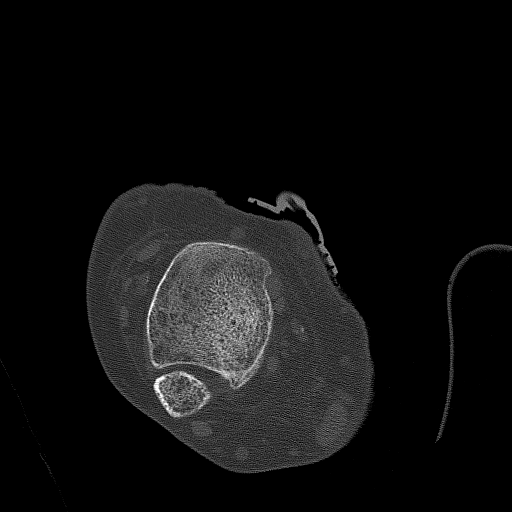

[Series 607: bone sagittal · coronal · 0.32mm/px · 3 of 49 slices shown]
[im 10/49  bone]
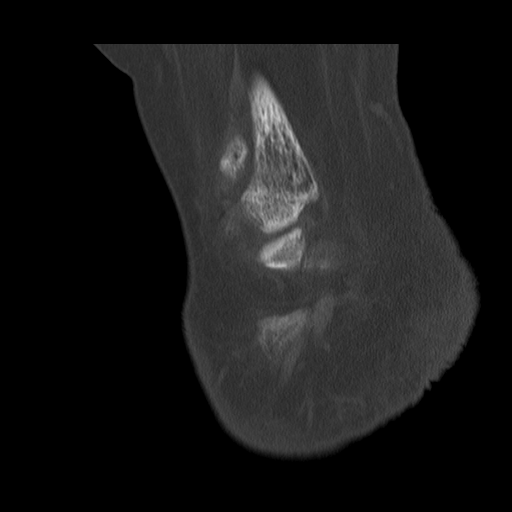
[im 20/49  bone]
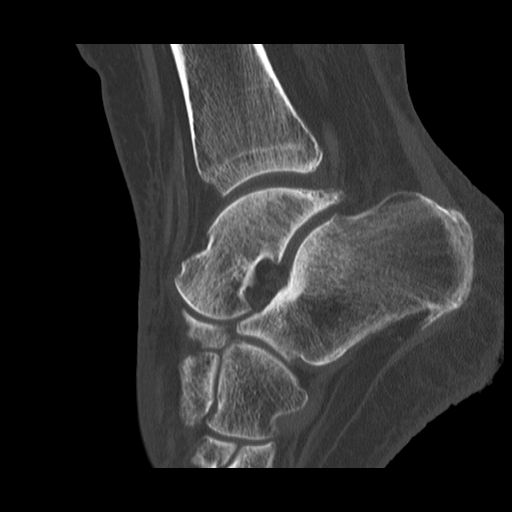
[im 29/49  bone]
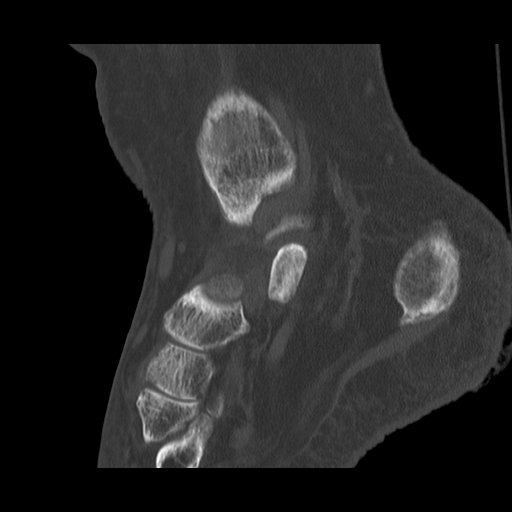

[6 of 20 positions shown; findings below may reference images not displayed]

FINDINGS: The soft tissues posterior to the calcaneal tuberosity
are partly excluded from the field of view.  As correlated with the
recent radiographs, there is skin ulceration in this area with mild
underlying edema in the subcutaneous fat.  There is no focal fluid
collection, soft tissue emphysema or foreign body.

No other inflammatory changes are identified.  There is diffuse
muscular atrophy.  The ankle tendons appear intact.

There are small posterior and moderate plantar calcaneal spurs.
There is no evidence of calcaneal bone destruction.  The subtalar
joint appears normal.  The additional bones of the midfoot and
hindfoot appear unremarkable.
IMPRESSION: 1.  No evidence of calcaneal osteomyelitis.
2.  Soft tissue wound posterior to the calcaneal tuberosity with
mild underlying cellulitis.  No evidence of soft tissue abscess.
3.  Generalized muscular atrophy.

## 2011-02-21 MED ORDER — SENNOSIDES-DOCUSATE SODIUM 8.6-50 MG PO TABS
2.0000 | ORAL_TABLET | Freq: Every evening | ORAL | Status: DC | PRN
  Administered 2011-02-21 – 2011-02-27 (×3): 2 via ORAL
  Filled 2011-02-21 (×6): qty 2

## 2011-02-21 MED ORDER — POLYETHYLENE GLYCOL 3350 17 G PO PACK
17.0000 g | PACK | Freq: Every day | ORAL | Status: DC
  Administered 2011-03-03: 17 g via ORAL
  Filled 2011-02-21 (×16): qty 1

## 2011-02-21 NOTE — Progress Notes (Signed)
Patient is requesting an order for Senna. LBM was on Sunday.  She reports that she normally takes 1 tab/day, but may like to have 2 since it has been awhile since her LBM.  MD to address.

## 2011-02-21 NOTE — Progress Notes (Signed)
Pt was active with [facility] and wants to continue with them for [facility] for wound care, HHPT, HHNA, at discharge.  mp

## 2011-02-21 NOTE — Progress Notes (Signed)
ANTIBIOTIC CONSULT NOTE - FOLLOW UP  Pharmacy Consult for vancomycin Indication: cellulitis  Allergies  Allergen Reactions  . Sulfa Antibiotics Hives    Patient Measurements: Height: 5\' 5"  (165.1 cm) Weight: 451 lb (204.572 kg) IBW/kg (Calculated) : 57  Adjusted Body Weight:   Vital Signs: Temp: 98.4 F (36.9 C) (11/28 2130) Temp src: Oral (11/28 2130) BP: 92/62 mmHg (11/28 2130) Pulse Rate: 73  (11/28 2130) Intake/Output from previous day: 11/28 0701 - 11/29 0700 In: 1390 [P.O.:840; IV Piggyback:550] Out: -  Intake/Output from this shift: Total I/O In: 670 [P.O.:120; IV Piggyback:550] Out: -   Labs:  Basename 02/20/11 0535 02/19/11 0500 02/18/11 1610  WBC 9.4 10.6* 12.2*  HGB 10.8* 10.9* 11.7*  PLT 253 257 259  LABCREA -- -- --  CREATININE 1.31* 1.34* 1.29*   Estimated Creatinine Clearance: 102.4 ml/min (by C-G formula based on Cr of 1.31).  Basename 02/20/11 2119  VANCOTROUGH 45.7*  VANCOPEAK --  Drue Dun --  GENTTROUGH --  GENTPEAK --  GENTRANDOM --  TOBRATROUGH --  TOBRAPEAK --  TOBRARND --  AMIKACINPEAK --  AMIKACINTROU --  AMIKACIN --     Microbiology: Recent Results (from the past 720 hour(s))  MRSA PCR SCREENING     Status: Abnormal   Collection Time   02/20/11  1:41 PM      Component Value Range Status Comment   MRSA by PCR POSITIVE (*) NEGATIVE  Final     Anti-infectives     Start     Dose/Rate Route Frequency Ordered Stop   02/19/11 0600   vancomycin (VANCOCIN) 1,500 mg in sodium chloride 0.9 % 500 mL IVPB  Status:  Discontinued        1,500 mg 250 mL/hr over 120 Minutes Intravenous Every 8 hours 02/18/11 2139 02/20/11 2254   02/18/11 2200   cefTAZidime (FORTAZ) 1 g in dextrose 5 % 50 mL IVPB  Status:  Discontinued        1 g 100 mL/hr over 30 Minutes Intravenous 3 times per day 02/18/11 2112 02/18/11 2134   02/18/11 2200   cefTAZidime (FORTAZ) 2 g in dextrose 5 % 50 mL IVPB        2 g 100 mL/hr over 30 Minutes Intravenous 3  times per day 02/18/11 2134     02/18/11 2115   vancomycin (VANCOCIN) IVPB 1000 mg/200 mL premix  Status:  Discontinued        1,000 mg 200 mL/hr over 60 Minutes Intravenous Every 12 hours 02/18/11 2112 02/18/11 2134   02/18/11 2115   vancomycin (VANCOCIN) IVPB 1000 mg/200 mL premix        1,000 mg 200 mL/hr over 60 Minutes Intravenous  Once 02/18/11 2134 02/19/11 1649   02/18/11 1700   cefTAZidime (FORTAZ) 2 g in dextrose 5 % 50 mL IVPB        2 g 100 mL/hr over 30 Minutes Intravenous To Emergency Dept 02/18/11 1540 02/18/11 1746   02/18/11 1700   vancomycin (VANCOCIN) IVPB 1000 mg/200 mL premix        1,000 mg 200 mL/hr over 60 Minutes Intravenous To Emergency Dept 02/18/11 1540 02/18/11 1909          Assessment: Patient with high vancomycin level.  2200 dose not given.    Goal of Therapy:  Vancomycin trough level 10-15 mcg/ml  Plan:  Measure antibiotic drug levels at steady state Follow up culture results will recheck vancomycin level as needed Discontinue current vancomycin dose.  Darlina Guys, Jacquenette Shone Crowford 02/21/2011,3:18 AM

## 2011-02-21 NOTE — Plan of Care (Signed)
Problem: Phase I Progression Outcomes Goal: Voiding-avoid urinary catheter unless indicated Outcome: Not Progressing Foley in place     

## 2011-02-21 NOTE — Progress Notes (Signed)
Subjective: Patient seen and examined ,C/O constipation  Objective: Vital signs in last 24 hours: Temp:  [97.6 F (36.4 C)-98.4 F (36.9 C)] 97.6 F (36.4 C) (11/29 0659) Pulse Rate:  [73-75] 75  (11/29 0659) Resp:  [18] 18  (11/29 0659) BP: (92-112)/(62-72) 112/72 mmHg (11/29 0659) SpO2:  [96 %-98 %] 98 % (11/29 0659) Weight change:  Last BM Date: 02/16/11  Intake/Output from previous day: 11/28 0701 - 11/29 0700 In: 1440 [P.O.:840; IV Piggyback:600] Out: -  Total I/O In: 840 [P.O.:240; I.V.:600] Out: 400 [Urine:400]   Physical Exam:  General: Alert, awake, oriented x3, in no acute distress.  Heart: Regular rate and rhythm, without murmurs, rubs, gallops.  Lungs: Clear to auscultation bilaterally.  Abdomen: Soft,OBESE, nontender, nondistended, positive bowel sounds.  Rt hip wound VAC  Extremities: right foot dressing/VAC in place    Lab Results: Results for orders placed during the hospital encounter of 02/18/11 (from the past 24 hour(s))  GLUCOSE, CAPILLARY     Status: Abnormal   Collection Time   02/20/11  5:04 PM      Component Value Range   Glucose-Capillary 123 (*) 70 - 99 (mg/dL)   Comment 1 Notify RN    VANCOMYCIN, TROUGH     Status: Abnormal   Collection Time   02/20/11  9:19 PM      Component Value Range   Vancomycin Tr 45.7 (*) 10.0 - 20.0 (ug/mL)  GLUCOSE, CAPILLARY     Status: Abnormal   Collection Time   02/20/11  9:40 PM      Component Value Range   Glucose-Capillary 139 (*) 70 - 99 (mg/dL)  CBC     Status: Abnormal   Collection Time   02/21/11  5:25 AM      Component Value Range   WBC 7.9  4.0 - 10.5 (K/uL)   RBC 3.61 (*) 3.87 - 5.11 (MIL/uL)   Hemoglobin 10.8 (*) 12.0 - 15.0 (g/dL)   HCT 16.1 (*) 09.6 - 46.0 (%)   MCV 99.2  78.0 - 100.0 (fL)   MCH 29.9  26.0 - 34.0 (pg)   MCHC 30.2  30.0 - 36.0 (g/dL)   RDW 04.5  40.9 - 81.1 (%)   Platelets 253  150 - 400 (K/uL)  BASIC METABOLIC PANEL     Status: Abnormal   Collection Time   02/21/11  5:25 AM      Component Value Range   Sodium 140  135 - 145 (mEq/L)   Potassium 4.7  3.5 - 5.1 (mEq/L)   Chloride 105  96 - 112 (mEq/L)   CO2 30  19 - 32 (mEq/L)   Glucose, Bld 111 (*) 70 - 99 (mg/dL)   BUN 20  6 - 23 (mg/dL)   Creatinine, Ser 9.14  0.50 - 1.10 (mg/dL)   Calcium 8.8  8.4 - 78.2 (mg/dL)   GFR calc non Af Amer 76 (*) >90 (mL/min)   GFR calc Af Amer 88 (*) >90 (mL/min)  GLUCOSE, CAPILLARY     Status: Abnormal   Collection Time   02/21/11  8:16 AM      Component Value Range   Glucose-Capillary 120 (*) 70 - 99 (mg/dL)   Comment 1 Notify RN    GLUCOSE, CAPILLARY     Status: Abnormal   Collection Time   02/21/11 11:43 AM      Component Value Range   Glucose-Capillary 229 (*) 70 - 99 (mg/dL)   Comment 1 Notify RN  Studies/Results: Ct Ankle Right Wo Contrast  02/21/2011  *RADIOLOGY REPORT*  Clinical Data: Right   IMPRESSION:  1.  No evidence of calcaneal osteomyelitis. 2.  Soft tissue wound posterior to the calcaneal tuberosity with mild underlying cellulitis.  No evidence of soft tissue abscess. 3.  Generalized muscular atrophy.  Original Report Authenticated By: Gerrianne Scale, M.D.    Medications:    . benazepril  10 mg Oral Daily  . cefTAZidime (FORTAZ)  IV  2 g Intravenous Q8H  . Chlorhexidine Gluconate Cloth  6 each Topical Q0600  . citalopram  20 mg Oral Daily  . enoxaparin  40 mg Subcutaneous QHS  . fentaNYL  400 mcg Transdermal Q72H  . fluticasone  2 spray Each Nare Daily  . gabapentin  300 mg Oral QHS  . insulin aspart  0-20 Units Subcutaneous TID WC  . insulin glargine  85 Units Subcutaneous QHS  . levothyroxine  112 mcg Oral Daily  . linagliptin  5 mg Oral Daily  . Liraglutide  1.8 mg Subcutaneous Daily  . metoCLOPramide  10 mg Oral Q8H  . mupirocin ointment  1 application Nasal BID  . nystatin   Topical BID  . pregabalin  75 mg Oral TID  . simvastatin  20 mg Oral Daily  . DISCONTD: carisoprodol  350 mg Oral Q4H  . DISCONTD:  vancomycin  1,500 mg Intravenous Q8H    acetaminophen, acetaminophen, carisoprodol, diazepam, HYDROmorphone, oxyCODONE, oxyCODONE-acetaminophen     . sodium chloride 75 mL/hr at 02/21/11 0549    Assessment/Plan:  1. Diabetic heel ulcer  S/p I&D and  right foot VAC CT-scan of the heel showed no evidence of osteomyelitis. Cont IV Vancomycin and Ceftazidime  Wound care   Pain control 2.Diabetes mellitus type 2 with complications, : continue lantus, SSI, hold metformin . 3.Morbid Obesity  4. R hip wound with wound vac : follow up with wound care ,plans to change vac MWF  5. Chronic pain syndrome: continue home dose of narcotics, i.e Fentanyl patch, dilaudid 6.Constipation Add senokot s and miralax prn. 7.Disposition: Pending PT eval and recommendation     LOS: 3 days   [name] 02/21/2011, 4:21 PM

## 2011-02-21 NOTE — Progress Notes (Signed)
No calc osteo r Cont wound vac May require additional debridement Will decide after another several days of vac Will need vac for 2 mos likely

## 2011-02-22 LAB — GLUCOSE, CAPILLARY
Glucose-Capillary: 109 mg/dL — ABNORMAL HIGH (ref 70–99)
Glucose-Capillary: 161 mg/dL — ABNORMAL HIGH (ref 70–99)
Glucose-Capillary: 123 mg/dL — ABNORMAL HIGH (ref 70–99)
Glucose-Capillary: 128 mg/dL — ABNORMAL HIGH (ref 70–99)

## 2011-02-22 LAB — VANCOMYCIN, TROUGH: Vancomycin Tr: 13.4 ug/mL (ref 10.0–20.0)

## 2011-02-22 MED ORDER — MORPHINE SULFATE 2 MG/ML IJ SOLN
INTRAMUSCULAR | Status: AC
  Filled 2011-02-22: qty 1

## 2011-02-22 MED ORDER — VANCOMYCIN HCL IN DEXTROSE 1-5 GM/200ML-% IV SOLN
1000.0000 mg | Freq: Two times a day (BID) | INTRAVENOUS | Status: DC
  Administered 2011-02-22 – 2011-03-05 (×23): 1000 mg via INTRAVENOUS
  Filled 2011-02-22 (×24): qty 200

## 2011-02-22 MED ORDER — DEXTROSE 5 % IV SOLN
2.0000 g | Freq: Two times a day (BID) | INTRAVENOUS | Status: DC
  Administered 2011-02-22 – 2011-03-05 (×22): 2 g via INTRAVENOUS
  Filled 2011-02-22 (×25): qty 2

## 2011-02-22 MED ORDER — MORPHINE SULFATE 2 MG/ML IJ SOLN
2.0000 mg | Freq: Once | INTRAMUSCULAR | Status: AC
  Administered 2011-02-22: 2 mg via INTRAVENOUS

## 2011-02-22 NOTE — Consult Note (Signed)
WOC consult Note Reason for Consult: VAC change over right hip wound Wound type: chronic, non-healing pressure ulcer Pressure Ulcer POA: Yes Measurement:2cm x 1cm x 3cm  Wound ZOX:WRUEA are rolled; pale pink interior Drainage (amount, consistency, odor) scan amount Periwound:intact Dressing procedure/placement/frequency: VAC placed using white foam into cavity, black foam "bridge" used to prevent pressure source when turning onto right side.  125 mmHg continuous pressure.  Dressing is combined with heel NPWT dressing via "y" connector. Nursing staff changing right heel dressing now. I will not routinely  Follow.Staff to change NPWT dressings.  Please re-consult if needed. Thanks, Ladona Mow, MSN, RN, [facility], CWOCN [phone])

## 2011-02-22 NOTE — Progress Notes (Signed)
Physical Therapy Evaluation Patient Details Name: [name] MRN: [id number] DOB: December 23, 1970 Today's Date: 02/22/2011 Time: 9562-1308 Charge: EVII  Problem List:  Patient Active Problem List  Diagnoses  . Healing pressure ulcer stage III  . Morbidly obese  . Diabetes mellitus type 2 with complications, uncontrolled  . Sleep apnea  . Hypothyroidism  . Chronic pain  . Acute lymphangitis  . Cellulitis of foot  . Heel ulcer due to DM  . Wound, open, hip or thigh    Past Medical History:  Past Medical History  Diagnosis Date  . Diabetes mellitus   . Hypertension   . Wound healing, delayed   . H/O: Bell's palsy   . Depression   . Fibromyalgia   . DJD (degenerative joint disease)   . Headache   . Asthma   . Right foot drop   . Hypoventilation associated with obesity syndrome   . Hypothyroidism   . Back pain    Past Surgical History:  Past Surgical History  Procedure Date  . Cholecystectomy   . Back surgery     for lumbar disc disease X2  . Brain surgery 1970  . Right elbow     PT Assessment/Plan/Recommendation PT Assessment Clinical Impression Statement: Pt would benefit from acute PT services in order to improve independence with bed mobility and transfers as well as limited ambulation in order to prepare for D/C to next venue.  Pt may need W/C for home use but pt does live in mobile home so this may not fit.  Pt reports fiance will be home 24/7 and can assist with any needs. Pt may need higher level care if unable to perform stairs and at least ambulate minimally, but pt reports she plans to D/C home. PT Recommendation/Assessment: Patient will need skilled PT in the acute care venue PT Problem List: Decreased strength;Decreased mobility;Decreased knowledge of use of DME;Obesity Barriers to Discharge: Inaccessible home environment (? W/C accessible) PT Therapy Diagnosis : Difficulty walking PT Plan PT Frequency: Min 3X/week PT Treatment/Interventions: DME  instruction;Gait training;Functional mobility training;Therapeutic exercise;Patient/family education;Wheelchair mobility training PT Recommendation Follow Up Recommendations: Home health PT Equipment Recommended: Wheelchair (measurements) PT Goals  Acute Rehab PT Goals PT Goal Formulation: With patient Time For Goal Achievement: 2 weeks Pt will go Supine/Side to Sit: with modified independence;with HOB 0 degrees PT Goal: Supine/Side to Sit - Progress: Progressing toward goal Pt will Transfer Sit to Stand/Stand to Sit: with modified independence PT Transfer Goal: Sit to Stand/Stand to Sit - Progress: Progressing toward goal Pt will Ambulate: 1 - 15 feet;with supervision;with rolling walker PT Goal: Ambulate - Progress: Progressing toward goal Pt will Go Up / Down Stairs: with min assist;3-5 stairs;with rail(s) PT Goal: Up/Down Stairs - Progress: Other (comment)  PT Evaluation Precautions/Restrictions  Precautions Precaution Comments: wound vac to R hip and heel with ankle splint to float heel Required Braces or Orthoses: Yes Other Brace/Splint: R ankle heel floater, pt also reports using AFO for foot drop PTA Restrictions Other Position/Activity Restrictions: None ordered but educated pt on TDWB so pt does not put pressure through heel Prior Functioning  Home Living Lives With: Significant other Receives Help From: Home health Type of Home: Mobile home Home Layout: One level Home Access: Stairs to enter Entrance Stairs-Rails: Can reach both Entrance Stairs-Number of Steps: 4 Home Adaptive Equipment: Walker - rolling Prior Function Level of Independence: Independent with basic ADLs;Requires assistive device for independence Comments: Pt reports using RW 2* R drop foot. Cognition Cognition  Arousal/Alertness: Awake/alert Overall Cognitive Status: Appears within functional limits for tasks assessed Sensation/Coordination Sensation Light Touch: Impaired Detail Light Touch  Impaired Details: Absent RLE Additional Comments: Pt reports absent light touch to R LE as well as drop foot from back surgery years ago. Extremity Assessment RLE Assessment RLE Assessment: Exceptions to [name] RLE Strength RLE Overall Strength Comments: grossly at least 3+/5 per functional observation Right Ankle Dorsiflexion: 0/5 LLE Strength LLE Overall Strength Comments: grossly at least 3+/5 per functional observation Mobility (including Balance) Bed Mobility Bed Mobility: Yes Supine to Sit: 4: Min assist;With rails;HOB elevated (Comment degrees) Supine to Sit Details (indicate cue type and reason): assist for R LE, verbal cues for technique, increased time Sitting - Scoot to Edge of Bed: 5: Supervision;With rail Sitting - Scoot to Edge of Bed Details (indicate cue type and reason): increased time Sit to Supine - Left: 3: Mod assist;HOB flat Sit to Supine - Left Details (indicate cue type and reason): assist for bringing LEs onto bed, increased time Transfers Transfers: Yes Sit to Stand: 1: +2 Total assist;From elevated surface;With upper extremity assist;From bed;From chair/3-in-1 Sit to Stand Details (indicate cue type and reason): pt=90%, +2 for safety, verbal cues for TDWB so pt did not put weight on R heel, verbal cues for hand placement Stand to Sit: 1: +2 Total assist;To bed;To chair/3-in-1;With upper extremity assist Stand to Sit Details: verbal cues for hand placement and controlling descent, pt=90%, again +2 for safety Stand Pivot Transfers: 1: +2 Total assist Stand Pivot Transfer Details (indicate cue type and reason): pt=95%, verbal cues for technique, pt did take a couple steps forward    Exercise    End of Session PT - End of Session Equipment Utilized During Treatment: Gait belt Activity Tolerance: Patient tolerated treatment well Patient left: in bed;with call bell in reach (with RN) Nurse Communication:  (RN present for transfers) General Behavior During  Session: [name] for tasks performed Cognition: [name] for tasks performed  [name],[name] 02/22/2011, 4:00 PM Pager: [phone]

## 2011-02-22 NOTE — Progress Notes (Addendum)
ANTIBIOTIC CONSULT NOTE - INITIAL  Pharmacy Consult for Cefepime Indication: Infected diabetic foot wound and hip wound  Allergies  Allergen Reactions  . Sulfa Antibiotics Hives    Patient Measurements: Height: 5\' 5"  (165.1 cm) Weight: 451 lb (204.572 kg) IBW/kg (Calculated) : 57    Vital Signs: Temp: 98.4 F (36.9 C) (11/30 1700) Temp src: Oral (11/30 1700) BP: 135/79 mmHg (11/30 1700) Pulse Rate: 86  (11/30 1700) Intake/Output from previous day: 11/29 0701 - 11/30 0700 In: 2480 [P.O.:480; I.V.:1800; IV Piggyback:200] Out: 1600 [Urine:1600] Intake/Output from this shift: Total I/O In: -  Out: 2350 [Urine:2350]  Labs:  [facility] 02/21/11 0525 02/20/11 0535  WBC 7.9 9.4  HGB 10.8* 10.8*  PLT 253 253  LABCREA -- --  CREATININE 0.92 1.31*   Estimated Creatinine Clearance: 145.9 ml/min (by C-G formula based on Cr of 0.92).  Basename 02/22/11 0525 02/20/11 2119  VANCOTROUGH 13.4 45.7*  VANCOPEAK -- --  Drue Dun -- --  GENTTROUGH -- --  GENTPEAK -- --  GENTRANDOM -- --  TOBRATROUGH -- --  TOBRAPEAK -- --  TOBRARND -- --  AMIKACINPEAK -- --  AMIKACINTROU -- --  AMIKACIN -- --     Microbiology: Recent Results (from the past 720 hour(s))  MRSA PCR SCREENING     Status: Abnormal   Collection Time   02/20/11  1:41 PM      Component Value Range Status Comment   MRSA by PCR POSITIVE (*) NEGATIVE  Final     Medical History: Past Medical History  Diagnosis Date  . Diabetes mellitus   . Hypertension   . Wound healing, delayed   . H/O: Bell's palsy   . Depression   . Fibromyalgia   . DJD (degenerative joint disease)   . Headache   . Asthma   . Right foot drop   . Hypoventilation associated with obesity syndrome   . Hypothyroidism   . Back pain     Medications:  Scheduled:    . benazepril  10 mg Oral Daily  . Chlorhexidine Gluconate Cloth  6 each Topical Q0600  . citalopram  20 mg Oral Daily  . enoxaparin  40 mg Subcutaneous QHS  . fentaNYL   400 mcg Transdermal Q72H  . fluticasone  2 spray Each Nare Daily  . gabapentin  300 mg Oral QHS  . insulin aspart  0-20 Units Subcutaneous TID WC  . insulin glargine  85 Units Subcutaneous QHS  . levothyroxine  112 mcg Oral Daily  . linagliptin  5 mg Oral Daily  . Liraglutide  1.8 mg Subcutaneous Daily  . metoCLOPramide  10 mg Oral Q8H  .  morphine injection  2 mg Intravenous Once  . morphine      . mupirocin ointment  1 application Nasal BID  . nystatin   Topical BID  . polyethylene glycol  17 g Oral Daily  . pregabalin  75 mg Oral TID  . simvastatin  20 mg Oral Daily  . vancomycin  1,000 mg Intravenous Q12H  . DISCONTD: cefTAZidime (FORTAZ)  IV  2 g Intravenous Q8H   Assessment: 40 yo F with infected diabetic foot wound and hip wound. On Vancomycin per pharmacy. Was on Elita Quick also, to change to Cefepime per pharmacy per ID orders.  Plan:  Cefepime 2g IV q12h. Follow labs and vitals and cultures. Adjust as necessary. LOT 2 weeks from last I & D per ID progress note.   [name] 02/22/2011,6:33 PM

## 2011-02-22 NOTE — Progress Notes (Signed)
ANTIBIOTIC CONSULT NOTE - FOLLOW UP  Pharmacy Consult for vancomycin Indication: Diabetic foot wound  Allergies  Allergen Reactions  . Sulfa Antibiotics Hives    Patient Measurements: Height: 5\' 5"  (165.1 cm) Weight: 451 lb (204.572 kg) IBW/kg (Calculated) : 57   Vital Signs: Temp: 98.1 F (36.7 C) (11/30 0600) Temp src: Oral (11/30 0600) BP: 109/73 mmHg (11/30 0600) Pulse Rate: 77  (11/30 0600) Intake/Output from previous day: 11/29 0701 - 11/30 0700 In: 2480 [P.O.:480; I.V.:1800; IV Piggyback:200] Out: 1600 [Urine:1600] Intake/Output from this shift:    Labs:  [facility] 02/21/11 0525 02/20/11 0535  WBC 7.9 9.4  HGB 10.8* 10.8*  PLT 253 253  LABCREA -- --  CREATININE 0.92 1.31*   Estimated Creatinine Clearance: 145.9 ml/min (by C-G formula based on Cr of 0.92).  Basename 02/22/11 0525 02/20/11 2119  VANCOTROUGH 13.4 45.7*  VANCOPEAK -- --  Drue Dun -- --  GENTTROUGH -- --  GENTPEAK -- --  GENTRANDOM -- --  TOBRATROUGH -- --  TOBRAPEAK -- --  TOBRARND -- --  AMIKACINPEAK -- --  AMIKACINTROU -- --  AMIKACIN -- --     Microbiology: Recent Results (from the past 720 hour(s))  MRSA PCR SCREENING     Status: Abnormal   Collection Time   02/20/11  1:41 PM      Component Value Range Status Comment   MRSA by PCR POSITIVE (*) NEGATIVE  Final     Anti-infectives     Start     Dose/Rate Route Frequency Ordered Stop   02/19/11 0600   vancomycin (VANCOCIN) 1,500 mg in sodium chloride 0.9 % 500 mL IVPB  Status:  Discontinued        1,500 mg 250 mL/hr over 120 Minutes Intravenous Every 8 hours 02/18/11 2139 02/20/11 2254   02/18/11 2200   cefTAZidime (FORTAZ) 1 g in dextrose 5 % 50 mL IVPB  Status:  Discontinued        1 g 100 mL/hr over 30 Minutes Intravenous 3 times per day 02/18/11 2112 02/18/11 2134   02/18/11 2200   cefTAZidime (FORTAZ) 2 g in dextrose 5 % 50 mL IVPB        2 g 100 mL/hr over 30 Minutes Intravenous 3 times per day 02/18/11 2134     02/18/11 2115   vancomycin (VANCOCIN) IVPB 1000 mg/200 mL premix  Status:  Discontinued        1,000 mg 200 mL/hr over 60 Minutes Intravenous Every 12 hours 02/18/11 2112 02/18/11 2134   02/18/11 2115   vancomycin (VANCOCIN) IVPB 1000 mg/200 mL premix        1,000 mg 200 mL/hr over 60 Minutes Intravenous  Once 02/18/11 2134 02/19/11 1649   02/18/11 1700   cefTAZidime (FORTAZ) 2 g in dextrose 5 % 50 mL IVPB        2 g 100 mL/hr over 30 Minutes Intravenous To Emergency Dept 02/18/11 1540 02/18/11 1746   02/18/11 1700   vancomycin (VANCOCIN) IVPB 1000 mg/200 mL premix        1,000 mg 200 mL/hr over 60 Minutes Intravenous To Emergency Dept 02/18/11 1540 02/18/11 1909          Assessment: First Vancomycin trough level = 45 and further doses were held. Vancomycin trough today = 13 No evidence of osteomyelitis  Goal of Therapy:  Vancomycin trough level 15-20 mcg/ml  Plan:  Measure antibiotic drug levels at steady state Follow up culture results will recheck vancomycin level as needed Resume vancomycin  at reduced dose of 1000mg  IV Q12 hrs  Lynann Beaver PharmD  Pager [phone] 02/22/2011 11:56 AM

## 2011-02-22 NOTE — Progress Notes (Signed)
Subjective: Patient seen and examined ,c/o right foot pain .  Objective: Vital signs in last 24 hours: Temp:  [98.1 F (36.7 C)-98.5 F (36.9 C)] 98.1 F (36.7 C) (11/30 0600) Pulse Rate:  [73-77] 77  (11/30 0600) Resp:  [18] 18  (11/30 0600) BP: (94-109)/(61-73) 109/73 mmHg (11/30 0600) SpO2:  [95 %-100 %] 100 % (11/30 0600) Weight change:  Last BM Date: 02/19/11  Intake/Output from previous day: 11/29 0701 - 11/30 0700 In: 2480 [P.O.:480; I.V.:1800; IV Piggyback:200] Out: 1600 [Urine:1600]     Physical Exam: General: Alert, awake, oriented x3, in no acute distress.  Heart: Regular rate and rhythm, without murmurs, rubs, gallops.  Lungs: Clear to auscultation bilaterally.  Abdomen: Soft,OBESE, nontender, nondistended, positive bowel sounds.  Rt thigh  VAC  Extremities: right foot dressing/VAC in place      Lab Results: Results for orders placed during the hospital encounter of 02/18/11 (from the past 24 hour(s))  GLUCOSE, CAPILLARY     Status: Abnormal   Collection Time   02/21/11 11:43 AM      Component Value Range   Glucose-Capillary 229 (*) 70 - 99 (mg/dL)   Comment 1 Notify RN    GLUCOSE, CAPILLARY     Status: Abnormal   Collection Time   02/21/11  5:24 PM      Component Value Range   Glucose-Capillary 123 (*) 70 - 99 (mg/dL)   Comment 1 Notify RN    GLUCOSE, CAPILLARY     Status: Abnormal   Collection Time   02/21/11  9:47 PM      Component Value Range   Glucose-Capillary 139 (*) 70 - 99 (mg/dL)   Comment 1 Notify RN    VANCOMYCIN, TROUGH     Status: Normal   Collection Time   02/22/11  5:25 AM      Component Value Range   Vancomycin Tr 13.4  10.0 - 20.0 (ug/mL)  GLUCOSE, CAPILLARY     Status: Abnormal   Collection Time   02/22/11  8:18 AM      Component Value Range   Glucose-Capillary 109 (*) 70 - 99 (mg/dL)    Studies/Results:   Medications:    . benazepril  10 mg Oral Daily  . cefTAZidime (FORTAZ)  IV  2 g Intravenous Q8H  .  Chlorhexidine Gluconate Cloth  6 each Topical Q0600  . citalopram  20 mg Oral Daily  . enoxaparin  40 mg Subcutaneous QHS  . fentaNYL  400 mcg Transdermal Q72H  . fluticasone  2 spray Each Nare Daily  . gabapentin  300 mg Oral QHS  . insulin aspart  0-20 Units Subcutaneous TID WC  . insulin glargine  85 Units Subcutaneous QHS  . levothyroxine  112 mcg Oral Daily  . linagliptin  5 mg Oral Daily  . Liraglutide  1.8 mg Subcutaneous Daily  . metoCLOPramide  10 mg Oral Q8H  . mupirocin ointment  1 application Nasal BID  . nystatin   Topical BID  . polyethylene glycol  17 g Oral Daily  . pregabalin  75 mg Oral TID  . simvastatin  20 mg Oral Daily    acetaminophen, acetaminophen, carisoprodol, diazepam, HYDROmorphone, oxyCODONE, oxyCODONE-acetaminophen, senna-docusate     . sodium chloride 75 mL/hr at 02/22/11 0600    Assessment/Plan:  1. Diabetic heel ulcer  S/p I&D and right foot VAC  CT-scan of the heel showed no evidence of osteomyelitis.  Cont IV Vancomycin and Ceftazidime  D#4,will need to clarify choice and  duration of therapy With ID before discharge . Dr Daiva Eves will kindly see her in consultation. Wound care ,may need another debridement as per ortho  Pain control  2.Diabetes mellitus type 2 with complications, : fair,continue lantus, SSI, hold metformin .  3.Morbid Obesity  4. R hip wound with wound vac : follow up with wound care ,plans to change vac MWF  5. Chronic pain syndrome: continue home dose of narcotics, i.e Fentanyl patch, dilaudid ,percocet 6.Constipation  Continue  senokot s and miralax prn.  7.Disposition:  Pending PT eval and recommendation and clearance by surgical service.     LOS: 4 days   [name] 02/22/2011, 8:52 AM

## 2011-02-22 NOTE — Consult Note (Signed)
Date of Admission:  02/18/2011  Date of Consult:  02/22/2011  Reason for Consult:Infected diabetic foot ulcer and hip wound Referring Physician: Dr. Cleotis Lema  HPI: [name] is an 40 y.o. 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 Wiliam Ke from wound care with vaccuum dressing who was admitted to [facility] after failing outpatien management of a right heel ulcer. She had developed streaking erythema going up her leg from the wound. She was found on exam to have necrotic tissue in place. She was started on IV vancomycin and ceftazidime. Dr. August Saucer from Orthopedics saw the patient and did I and D of necrotic tissue. There is not mention of bone debrideement. MRI was not possible due to body weight. CT scan failed to show obvious osteomyelitis. The patient has improved on this regimen. She is feelign better adn due for another I and D. We were consulted to help consider oral vs IV antibiotics and to assist the management of this diabetic foot infection.   Past Medical History  Diagnosis Date  . Diabetes mellitus   . Hypertension   . Wound healing, delayed   . H/O: Bell's palsy   . Depression   . Fibromyalgia   . DJD (degenerative joint disease)   . Headache   . Asthma   . Right foot drop   . Hypoventilation associated with obesity syndrome   . Hypothyroidism   . Back pain     Past Surgical History  Procedure Date  . Cholecystectomy   . Back surgery     for lumbar disc disease X2  . Brain surgery 1970  . Right elbow   ergies:   Allergies  Allergen Reactions  . Sulfa Antibiotics Hives     Medications: I have reviewed patients current medications as documented in Epic Anti-infectives     Start     Dose/Rate Route Frequency Ordered Stop   02/22/11 1300   vancomycin (VANCOCIN) IVPB 1000 mg/200 mL premix        1,000 mg 200 mL/hr over 60 Minutes Intravenous Every 12 hours 02/22/11 1159     02/19/11 0600    vancomycin (VANCOCIN) 1,500 mg in sodium chloride 0.9 % 500 mL IVPB  Status:  Discontinued        1,500 mg 250 mL/hr over 120 Minutes Intravenous Every 8 hours 02/18/11 2139 02/20/11 2254   02/18/11 2200   cefTAZidime (FORTAZ) 1 g in dextrose 5 % 50 mL IVPB  Status:  Discontinued        1 g 100 mL/hr over 30 Minutes Intravenous 3 times per day 02/18/11 2112 02/18/11 2134   02/18/11 2200   cefTAZidime (FORTAZ) 2 g in dextrose 5 % 50 mL IVPB        2 g 100 mL/hr over 30 Minutes Intravenous 3 times per day 02/18/11 2134     02/18/11 2115   vancomycin (VANCOCIN) IVPB 1000 mg/200 mL premix  Status:  Discontinued        1,000 mg 200 mL/hr over 60 Minutes Intravenous Every 12 hours 02/18/11 2112 02/18/11 2134   02/18/11 2115   vancomycin (VANCOCIN) IVPB 1000 mg/200 mL premix        1,000 mg 200 mL/hr over 60 Minutes Intravenous  Once 02/18/11 2134 02/19/11 1649   02/18/11 1700   cefTAZidime (FORTAZ) 2 g in dextrose 5 % 50 mL IVPB        2 g 100 mL/hr over 30 Minutes  Intravenous To Emergency Dept 02/18/11 1540 02/18/11 1746   02/18/11 1700   vancomycin (VANCOCIN) IVPB 1000 mg/200 mL premix        1,000 mg 200 mL/hr over 60 Minutes Intravenous To Emergency Dept 02/18/11 1540 02/18/11 1909          Social History:  reports that she has quit smoking. She has never used smokeless tobacco. She reports that she does not drink alcohol or use illicit drugs.  Family History  Problem Relation Age of Onset  . Diabetes type II Father   . Diabetes type II Mother     As in HPI and primary teams notes otherwise 12 point review of systems is negative  Blood pressure 109/73, pulse 77, temperature 98.1 F (36.7 C), temperature source Oral, resp. rate 18, height 5\' 5"  (1.651 m), weight 451 lb (204.572 kg), SpO2 100.00%. General: Alert and awake, oriented x3, not in any acute distress, moribidly obese HEENT: anicteric sclera, pupils reactive to light and accommodation, EOMI, oropharynx clear and  without exudate CVS regular rate, normal r,  no murmur rubs or gallops Chest: clear to auscultation bilaterally, no wheezing, rales or rhonchi Abdomen: soft nontender, nondistended, normal bowel sounds,  Skin: right hip with wound vaccuum in place, right ankle with wound vacuum, there is no significant surrounding erythema or fluctuance EXt: 2+ edema Neuro: nonfocal, strength and sensation intact   Results for orders placed during the hospital encounter of 02/18/11 (from the past 48 hour(s))  VANCOMYCIN, TROUGH     Status: Abnormal   Collection Time   02/20/11  9:19 PM      Component Value Range Comment   Vancomycin Tr 45.7 (*) 10.0 - 20.0 (ug/mL)   GLUCOSE, CAPILLARY     Status: Abnormal   Collection Time   02/20/11  9:40 PM      Component Value Range Comment   Glucose-Capillary 139 (*) 70 - 99 (mg/dL)   CBC     Status: Abnormal   Collection Time   02/21/11  5:25 AM      Component Value Range Comment   WBC 7.9  4.0 - 10.5 (K/uL)    RBC 3.61 (*) 3.87 - 5.11 (MIL/uL)    Hemoglobin 10.8 (*) 12.0 - 15.0 (g/dL)    HCT 16.1 (*) 09.6 - 46.0 (%)    MCV 99.2  78.0 - 100.0 (fL)    MCH 29.9  26.0 - 34.0 (pg)    MCHC 30.2  30.0 - 36.0 (g/dL)    RDW 04.5  40.9 - 81.1 (%)    Platelets 253  150 - 400 (K/uL)   BASIC METABOLIC PANEL     Status: Abnormal   Collection Time   02/21/11  5:25 AM      Component Value Range Comment   Sodium 140  135 - 145 (mEq/L)    Potassium 4.7  3.5 - 5.1 (mEq/L)    Chloride 105  96 - 112 (mEq/L)    CO2 30  19 - 32 (mEq/L)    Glucose, Bld 111 (*) 70 - 99 (mg/dL)    BUN 20  6 - 23 (mg/dL)    Creatinine, Ser 9.14  0.50 - 1.10 (mg/dL)    Calcium 8.8  8.4 - 10.5 (mg/dL)    GFR calc non Af Amer 76 (*) >90 (mL/min)    GFR calc Af Amer 88 (*) >90 (mL/min)   GLUCOSE, CAPILLARY     Status: Abnormal   Collection Time   02/21/11  8:16  AM      Component Value Range Comment   Glucose-Capillary 120 (*) 70 - 99 (mg/dL)    Comment 1 Notify RN     GLUCOSE, CAPILLARY      Status: Abnormal   Collection Time   02/21/11 11:43 AM      Component Value Range Comment   Glucose-Capillary 229 (*) 70 - 99 (mg/dL)    Comment 1 Notify RN     GLUCOSE, CAPILLARY     Status: Abnormal   Collection Time   02/21/11  5:24 PM      Component Value Range Comment   Glucose-Capillary 123 (*) 70 - 99 (mg/dL)    Comment 1 Notify RN     GLUCOSE, CAPILLARY     Status: Abnormal   Collection Time   02/21/11  9:47 PM      Component Value Range Comment   Glucose-Capillary 139 (*) 70 - 99 (mg/dL)    Comment 1 Notify RN     VANCOMYCIN, TROUGH     Status: Normal   Collection Time   02/22/11  5:25 AM      Component Value Range Comment   Vancomycin Tr 13.4  10.0 - 20.0 (ug/mL)   GLUCOSE, CAPILLARY     Status: Abnormal   Collection Time   02/22/11  8:18 AM      Component Value Range Comment   Glucose-Capillary 109 (*) 70 - 99 (mg/dL)   GLUCOSE, CAPILLARY     Status: Abnormal   Collection Time   02/22/11 12:12 PM      Component Value Range Comment   Glucose-Capillary 161 (*) 70 - 99 (mg/dL)    Comment 1 Notify RN     GLUCOSE, CAPILLARY     Status: Abnormal   Collection Time   02/22/11  4:41 PM      Component Value Range Comment   Glucose-Capillary 123 (*) 70 - 99 (mg/dL)    Comment 1 Notify RN         Component Value Date/Time   SDES URINE, CATHETERIZED 06/22/2009 1551   SPECREQUEST NONE 06/22/2009 1551   CULT ESCHERICHIA COLI 06/22/2009 1551   REPTSTATUS 06/24/2009 FINAL 06/22/2009 1551   Ct Ankle Right Wo Contrast  02/21/2011  *RADIOLOGY REPORT*  Clinical Data: Right ankle and hip wounds.  Evaluate for calcaneal osteomyelitis.  CT OF THE RIGHT ANKLE WITH CONTRAST  Technique:  Multidetector CT imaging was performed following the standard protocol during bolus administration of intravenous contrast.  Comparison: Radiographs 02/12/2011 and 02/18/2011.  Findings: The soft tissues posterior to the calcaneal tuberosity are partly excluded from the field of view.  As correlated  with the recent radiographs, there is skin ulceration in this area with mild underlying edema in the subcutaneous fat.  There is no focal fluid collection, soft tissue emphysema or foreign body.  No other inflammatory changes are identified.  There is diffuse muscular atrophy.  The ankle tendons appear intact.  There are small posterior and moderate plantar calcaneal spurs. There is no evidence of calcaneal bone destruction.  The subtalar joint appears normal.  The additional bones of the midfoot and hindfoot appear unremarkable.  IMPRESSION:  1.  No evidence of calcaneal osteomyelitis. 2.  Soft tissue wound posterior to the calcaneal tuberosity with mild underlying cellulitis.  No evidence of soft tissue abscess. 3.  Generalized muscular atrophy.  Original Report Authenticated By: Gerrianne Scale, M.D.     Recent Results (from the past 720 hour(s))  MRSA PCR SCREENING  Status: Abnormal   Collection Time   02/20/11  1:41 PM      Component Value Range Status Comment   MRSA by PCR POSITIVE (*) NEGATIVE  Final      Impression/Recommendation 40 year old with morbid obesity, diabetes chronic soft tissue wound right hip, with now a significant necrotic area right ankle sp I and D by Dr. August Saucer. No radiographic evidence of osteo. No culture data  Diabetic foot ulcer: --would go ahead and finished 2 week course of vanocmycin and IV cefepime dated from last I and D by Surgery --will need close followup with her wound care MD --she will need weekly cbc, bmp and vancomycin levels checked  --I will send a ESR off  Right hip wound: --continue wound care per wound center, no evidence overtly of active infection here  Screening: --check hiv     Thank you so much for this interesting consult, Please call back with further questions.  Acey Lav 02/22/2011, 6:17 PM   [phone] (pager) [phone] (office)

## 2011-02-23 LAB — SEDIMENTATION RATE: Sed Rate: 55 mm/hr — ABNORMAL HIGH (ref 0–22)

## 2011-02-23 LAB — GLUCOSE, CAPILLARY
Glucose-Capillary: 95 mg/dL (ref 70–99)
Glucose-Capillary: 165 mg/dL — ABNORMAL HIGH (ref 70–99)
Glucose-Capillary: 144 mg/dL — ABNORMAL HIGH (ref 70–99)
Glucose-Capillary: 160 mg/dL — ABNORMAL HIGH (ref 70–99)

## 2011-02-23 LAB — HIV ANTIBODY (ROUTINE TESTING W REFLEX): HIV: NONREACTIVE

## 2011-02-23 MED ORDER — CITALOPRAM HYDROBROMIDE 40 MG PO TABS
60.0000 mg | ORAL_TABLET | Freq: Every day | ORAL | Status: DC
  Administered 2011-02-24 – 2011-03-08 (×13): 60 mg via ORAL
  Filled 2011-02-23 (×14): qty 1

## 2011-02-23 NOTE — Progress Notes (Signed)
Subjective: Patient seen and examined ,C/O back and right foot pain .Denies any lower extremity weakness or numbness   Objective: Vital signs in last 24 hours: Temp:  [98.3 F (36.8 C)-98.8 F (37.1 C)] 98.5 F (36.9 C) (12/01 1413) Pulse Rate:  [72-86] 72  (12/01 1413) Resp:  [16-20] 18  (12/01 1413) BP: (101-135)/(59-79) 105/69 mmHg (12/01 1413) SpO2:  [96 %-100 %] 96 % (12/01 1413) Weight change:  Last BM Date: 02/19/11  Intake/Output from previous day: 11/30 0701 - 12/01 0700 In: 1590 [P.O.:240; I.V.:900; IV Piggyback:450] Out: 4850 [Urine:4850] Total I/O In: 240 [P.O.:240] Out: 800 [Urine:800]   Physical Exam: General: Alert, awake, oriented x3, in no acute distress.  Heart: Regular rate and rhythm, without murmurs, rubs, gallops.  Lungs: Clear to auscultation bilaterally.  Abdomen: Soft,OBESE, nontender, nondistended, positive bowel sounds.  Rt thigh VAC  Extremities: right foot dressing/VAC in place  Neuro: moves extremities ,intact non focal exam.     Lab Results: Results for orders placed during the hospital encounter of 02/18/11 (from the past 24 hour(s))  GLUCOSE, CAPILLARY     Status: Abnormal   Collection Time   02/22/11  4:41 PM      Component Value Range   Glucose-Capillary 123 (*) 70 - 99 (mg/dL)   Comment 1 Notify RN    GLUCOSE, CAPILLARY     Status: Abnormal   Collection Time   02/22/11 10:08 PM      Component Value Range   Glucose-Capillary 128 (*) 70 - 99 (mg/dL)   Comment 1 Notify RN     Comment 2 Documented in Chart    SEDIMENTATION RATE     Status: Abnormal   Collection Time   02/23/11  6:00 AM      Component Value Range   Sed Rate 55 (*) 0 - 22 (mm/hr)  HIV ANTIBODY (ROUTINE TESTING)     Status: Normal   Collection Time   02/23/11  6:00 AM      Component Value Range   HIV NON REACTIVE  NON REACTIVE   GLUCOSE, CAPILLARY     Status: Normal   Collection Time   02/23/11  7:41 AM      Component Value Range   Glucose-Capillary 95  70 -  99 (mg/dL)   Comment 1 Notify RN    GLUCOSE, CAPILLARY     Status: Abnormal   Collection Time   02/23/11 11:53 AM      Component Value Range   Glucose-Capillary 165 (*) 70 - 99 (mg/dL)   Comment 1 Notify RN      Studies/Results: No results found.  Medications:    . benazepril  10 mg Oral Daily  . ceFEPime (MAXIPIME) IV  2 g Intravenous Q12H  . Chlorhexidine Gluconate Cloth  6 each Topical Q0600  . citalopram  20 mg Oral Daily  . enoxaparin  40 mg Subcutaneous QHS  . fentaNYL  400 mcg Transdermal Q72H  . fluticasone  2 spray Each Nare Daily  . gabapentin  300 mg Oral QHS  . insulin aspart  0-20 Units Subcutaneous TID WC  . insulin glargine  85 Units Subcutaneous QHS  . levothyroxine  112 mcg Oral Daily  . linagliptin  5 mg Oral Daily  . Liraglutide  1.8 mg Subcutaneous Daily  . metoCLOPramide  10 mg Oral Q8H  .  morphine injection  2 mg Intravenous Once  . morphine      . mupirocin ointment  1 application Nasal BID  .  nystatin   Topical BID  . polyethylene glycol  17 g Oral Daily  . pregabalin  75 mg Oral TID  . simvastatin  20 mg Oral Daily  . vancomycin  1,000 mg Intravenous Q12H  . DISCONTD: cefTAZidime (FORTAZ)  IV  2 g Intravenous Q8H    acetaminophen, acetaminophen, carisoprodol, diazepam, HYDROmorphone, oxyCODONE, oxyCODONE-acetaminophen, senna-docusate     . sodium chloride 75 mL/hr at 02/22/11 1422    Assessment/Plan:  1. Diabetic heel ulcer  S/p I&D and right foot VAC  CT-scan of the heel showed no evidence of osteomyelitis.  Cont IV Vancomycin and Ceftazidime D#5,to complete 2 weeks from last I&D.weekly cbc, bmp and vancomycin level. Appreciate  Dr Zenaida Niece Dam's input . Wound care ,may need another debridement as per ortho  Pain control  2.Diabetes mellitus type 2 with complications, : fair,continue lantus, SSI, hold metformin .  3.Morbid Obesity  4. R hip wound with wound vac : follow up with wound care ,plans to change vac MWF  5. Chronic pain  syndrome: continue home dose of narcotics, i.e Fentanyl patch, dilaudid ,percocet  6.Constipation  Continue senokot s and miralax prn.  7.Disposition:  Pending  clearance by surgical service. PT recommended HHPT/wheelchair.     LOS: 5 days   [name] 02/23/2011, 4:22 PM

## 2011-02-24 LAB — GLUCOSE, CAPILLARY
Glucose-Capillary: 126 mg/dL — ABNORMAL HIGH (ref 70–99)
Glucose-Capillary: 154 mg/dL — ABNORMAL HIGH (ref 70–99)
Glucose-Capillary: 198 mg/dL — ABNORMAL HIGH (ref 70–99)
Glucose-Capillary: 204 mg/dL — ABNORMAL HIGH (ref 70–99)

## 2011-02-24 NOTE — Progress Notes (Signed)
Subjective: Patient seen and examined ,denies any new complaints other than chronic intermittant back pain and occasional right foot pain.  Objective: Vital signs in last 24 hours: Temp:  [97.9 F (36.6 C)-98.5 F (36.9 C)] 97.9 F (36.6 C) (12/02 0630) Pulse Rate:  [69-72] 69  (12/02 0630) Resp:  [18] 18  (12/02 0630) BP: (105-112)/(63-69) 106/66 mmHg (12/02 0630) SpO2:  [92 %-99 %] 99 % (12/02 0630) Weight change:  Last BM Date: 02/17/11  Intake/Output from previous day: 12/01 0701 - 12/02 0700 In: 290 [P.O.:240; IV Piggyback:50] Out: 3400 [Urine:3400] Total I/O In: 240 [P.O.:240] Out: -    Physical Exam: General: Alert, awake, oriented x3, in no acute distress.  Heart: Regular rate and rhythm, without murmurs, rubs, gallops.  Lungs: Clear to auscultation bilaterally.  Abdomen: Soft,OBESE, nontender, nondistended, positive bowel sounds.  Rt thigh VAC  Extremities: right foot dressing/VAC in place  Neuro: moves extremities ,intact non focal exam.    Lab Results: Results for orders placed during the hospital encounter of 02/18/11 (from the past 24 hour(s))  GLUCOSE, CAPILLARY     Status: Abnormal   Collection Time   02/23/11  4:26 PM      Component Value Range   Glucose-Capillary 144 (*) 70 - 99 (mg/dL)   Comment 1 Notify RN    GLUCOSE, CAPILLARY     Status: Abnormal   Collection Time   02/23/11  9:48 PM      Component Value Range   Glucose-Capillary 160 (*) 70 - 99 (mg/dL)  GLUCOSE, CAPILLARY     Status: Abnormal   Collection Time   02/24/11  7:31 AM      Component Value Range   Glucose-Capillary 126 (*) 70 - 99 (mg/dL)   Comment 1 Notify RN    GLUCOSE, CAPILLARY     Status: Abnormal   Collection Time   02/24/11  1:14 PM      Component Value Range   Glucose-Capillary 154 (*) 70 - 99 (mg/dL)   Comment 1 Notify RN      Studies/Results: No results found.  Medications:    . benazepril  10 mg Oral Daily  . ceFEPime (MAXIPIME) IV  2 g Intravenous Q12H  .  Chlorhexidine Gluconate Cloth  6 each Topical Q0600  . citalopram  60 mg Oral Daily  . enoxaparin  40 mg Subcutaneous QHS  . fentaNYL  400 mcg Transdermal Q72H  . fluticasone  2 spray Each Nare Daily  . gabapentin  300 mg Oral QHS  . insulin aspart  0-20 Units Subcutaneous TID WC  . insulin glargine  85 Units Subcutaneous QHS  . levothyroxine  112 mcg Oral Daily  . linagliptin  5 mg Oral Daily  . Liraglutide  1.8 mg Subcutaneous Daily  . metoCLOPramide  10 mg Oral Q8H  . mupirocin ointment  1 application Nasal BID  . nystatin   Topical BID  . polyethylene glycol  17 g Oral Daily  . pregabalin  75 mg Oral TID  . simvastatin  20 mg Oral Daily  . vancomycin  1,000 mg Intravenous Q12H  . DISCONTD: citalopram  20 mg Oral Daily    acetaminophen, acetaminophen, carisoprodol, diazepam, HYDROmorphone, oxyCODONE, oxyCODONE-acetaminophen, senna-docusate     . sodium chloride 75 mL/hr at 02/22/11 1422    Assessment/Plan:  1. Diabetic heel ulcer  S/p I&D and right foot VAC  CT-scan of the heel showed no evidence of osteomyelitis.  Cont IV Vancomycin and Ceftazidime D#6,to complete 2 weeks from  last I&D.weekly cbc, bmp and vancomycin level.  Appreciate Dr Zenaida Niece Dam's input .  Wound care ,may need another debridement as per ortho ,? Planned for tomorrow Pain control  2.Diabetes mellitus type 2 with complications, : fair,continue lantus, SSI, hold metformin .  3.Morbid Obesity  4. R hip wound with wound vac : follow up with wound care ,plans to change vac MWF  5. Chronic pain syndrome: continue home dose of narcotics, i.e Fentanyl patch, dilaudid ,percocet  6.Constipation  Continue senokot s and miralax prn.  7.Disposition:  Pending clearance by surgical service. PT recommended HHPT/wheelchair.      LOS: 6 days   Derold Dorsch 02/24/2011, 1:45 PM

## 2011-02-24 NOTE — Progress Notes (Signed)
Pt stable  Vac in place Will likely need 4 - 6 wks of vac rx to achieve healing Will decide wed based on appearance if further debridement necessary

## 2011-02-25 ENCOUNTER — Inpatient Hospital Stay (HOSPITAL_COMMUNITY): Payer: Medicare Other

## 2011-02-25 LAB — CBC
HCT: 35.9 % — ABNORMAL LOW (ref 36.0–46.0)
Hemoglobin: 11.1 g/dL — ABNORMAL LOW (ref 12.0–15.0)
MCH: 29.5 pg (ref 26.0–34.0)
MCHC: 30.9 g/dL (ref 30.0–36.0)
MCV: 95.5 fL (ref 78.0–100.0)
Platelets: 207 10*3/uL (ref 150–400)
RBC: 3.76 MIL/uL — ABNORMAL LOW (ref 3.87–5.11)
RDW: 12.6 % (ref 11.5–15.5)
WBC: 8.9 10*3/uL (ref 4.0–10.5)

## 2011-02-25 LAB — GLUCOSE, CAPILLARY
Glucose-Capillary: 113 mg/dL — ABNORMAL HIGH (ref 70–99)
Glucose-Capillary: 302 mg/dL — ABNORMAL HIGH (ref 70–99)
Glucose-Capillary: 177 mg/dL — ABNORMAL HIGH (ref 70–99)
Glucose-Capillary: 182 mg/dL — ABNORMAL HIGH (ref 70–99)

## 2011-02-25 LAB — BASIC METABOLIC PANEL
BUN: 15 mg/dL (ref 6–23)
CO2: 35 mEq/L — ABNORMAL HIGH (ref 19–32)
Calcium: 9.6 mg/dL (ref 8.4–10.5)
Chloride: 103 mEq/L (ref 96–112)
Creatinine, Ser: 0.79 mg/dL (ref 0.50–1.10)
GFR calc Af Amer: >90 mL/min (ref >90)
GFR calc non Af Amer: >90 mL/min (ref >90)
Glucose, Bld: 133 mg/dL — ABNORMAL HIGH (ref 70–99)
Potassium: 4.3 mEq/L (ref 3.5–5.1)
Sodium: 141 mEq/L (ref 135–145)

## 2011-02-25 IMAGING — CR DG CHEST 1V PORT
1 series · 1 of 1 positions shown · non-contrast
Comparison: Chest x-ray of [DATE]

CLINICAL DATA: Cough, shortness of breath

PORTABLE CHEST - 1 VIEW

[AP]
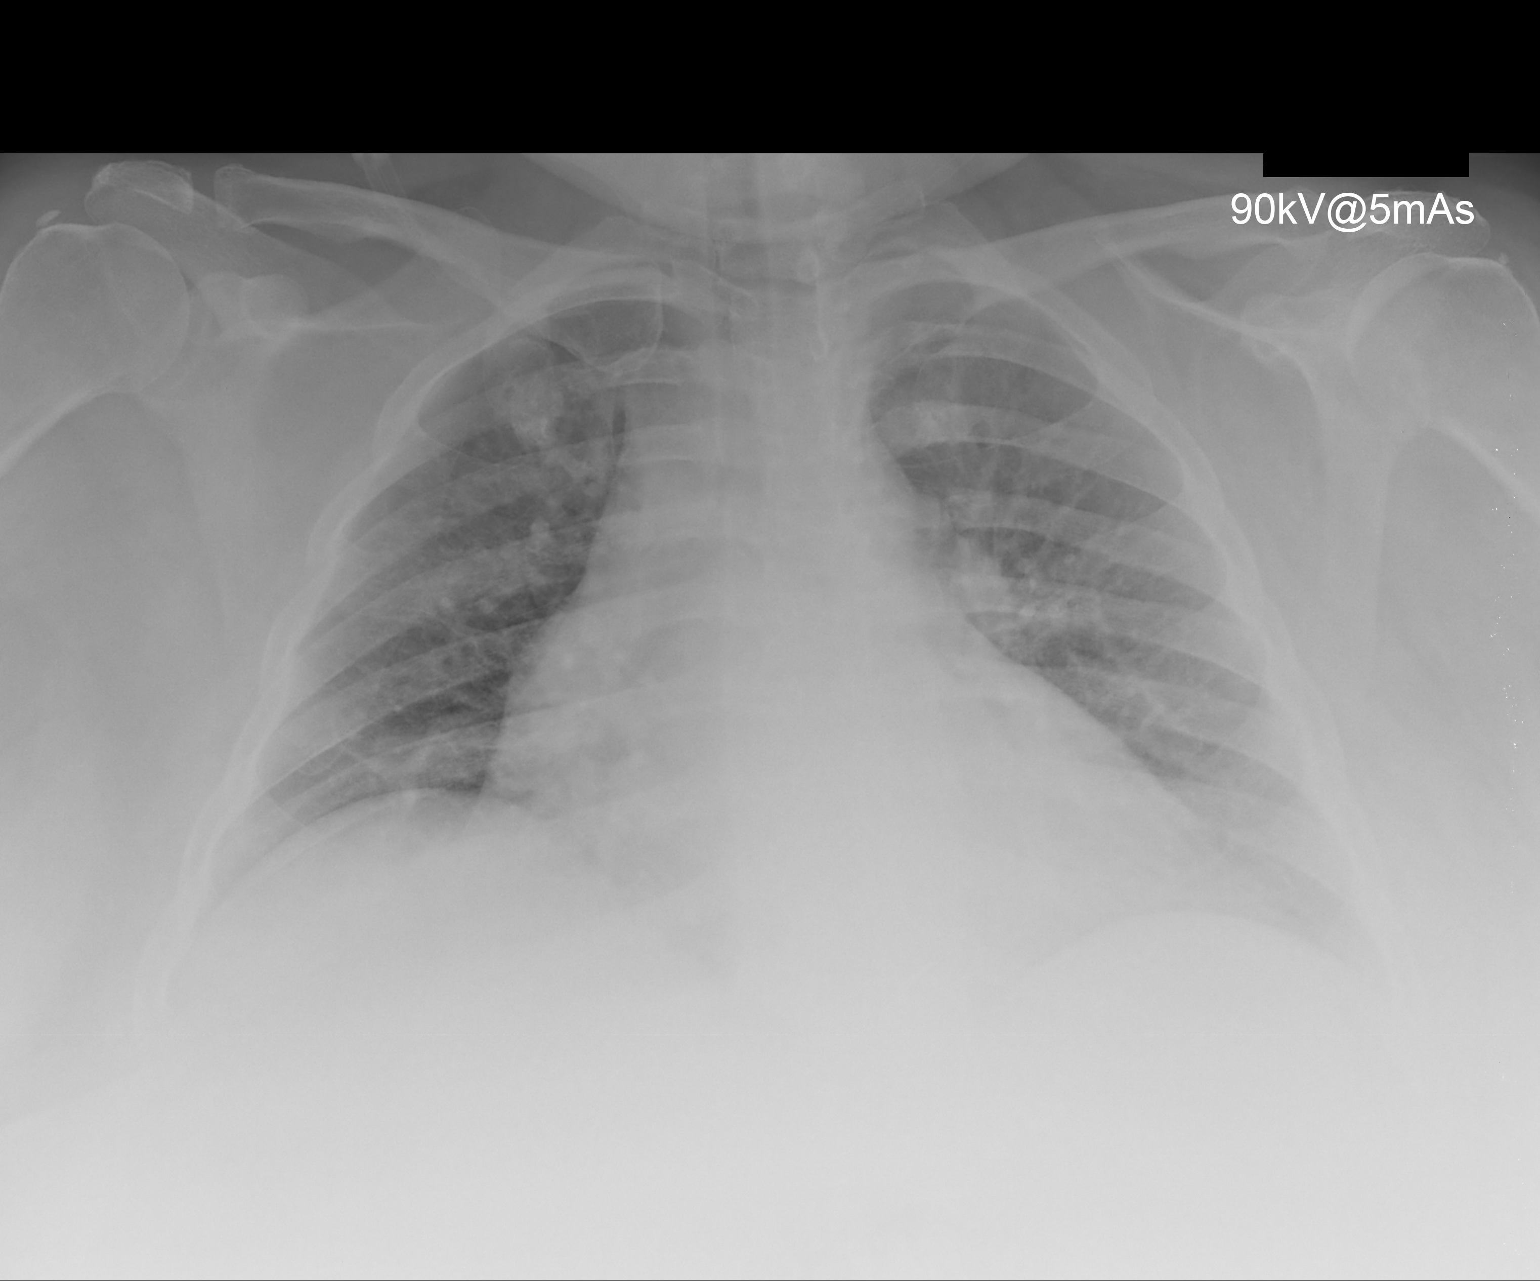

[1 of 1 positions shown; findings below may reference images not displayed]

FINDINGS: The lungs are not well aerated.  No focal infiltrate or
effusion is seen.  Cardiomegaly is stable.  No bony abnormality is
seen.
IMPRESSION: Poor inspiration.  Stable cardiomegaly.  No definite active
process.

## 2011-02-25 NOTE — Progress Notes (Signed)
Subjective: Patient seen and examined ,C/O cough productive of greenish sputum ,denies fever or chills.  Objective: Vital signs in last 24 hours: Temp:  [97.3 F (36.3 C)-98.1 F (36.7 C)] 97.6 F (36.4 C) (12/03 0559) Pulse Rate:  [64-78] 69  (12/03 0559) Resp:  [18] 18  (12/03 0559) BP: (95-113)/(61-68) 113/68 mmHg (12/03 0559) SpO2:  [94 %-100 %] 94 % (12/03 0559) Weight change:  Last BM Date: 02/24/11  Intake/Output from previous day: 12/02 0701 - 12/03 0700 In: 1970 [P.O.:720; I.V.:900; IV Piggyback:350] Out: 2200 [Urine:2200]     Physical Exam: General: Alert, awake, oriented x3, in no acute distress.  Heart: Regular rate and rhythm, without murmurs, rubs, gallops.  Lungs: Clear to auscultation bilaterally. Decreased BS. Abdomen: Soft,OBESE, nontender, nondistended, positive bowel sounds.  Rt thigh VAC  Extremities: right foot dressing/VAC in place  Neuro: moves extremities ,intact non focal exam.    Lab Results: Results for orders placed during the hospital encounter of 02/18/11 (from the past 24 hour(s))  GLUCOSE, CAPILLARY     Status: Abnormal   Collection Time   02/24/11  1:14 PM      Component Value Range   Glucose-Capillary 154 (*) 70 - 99 (mg/dL)   Comment 1 Notify RN    GLUCOSE, CAPILLARY     Status: Abnormal   Collection Time   02/24/11  4:51 PM      Component Value Range   Glucose-Capillary 198 (*) 70 - 99 (mg/dL)   Comment 1 Notify RN    GLUCOSE, CAPILLARY     Status: Abnormal   Collection Time   02/24/11  9:44 PM      Component Value Range   Glucose-Capillary 204 (*) 70 - 99 (mg/dL)  CBC     Status: Abnormal   Collection Time   02/25/11  5:40 AM      Component Value Range   WBC 8.9  4.0 - 10.5 (K/uL)   RBC 3.76 (*) 3.87 - 5.11 (MIL/uL)   Hemoglobin 11.1 (*) 12.0 - 15.0 (g/dL)   HCT 16.1 (*) 09.6 - 46.0 (%)   MCV 95.5  78.0 - 100.0 (fL)   MCH 29.5  26.0 - 34.0 (pg)   MCHC 30.9  30.0 - 36.0 (g/dL)   RDW 04.5  40.9 - 81.1 (%)   Platelets 207   150 - 400 (K/uL)  BASIC METABOLIC PANEL     Status: Abnormal   Collection Time   02/25/11  5:40 AM      Component Value Range   Sodium 141  135 - 145 (mEq/L)   Potassium 4.3  3.5 - 5.1 (mEq/L)   Chloride 103  96 - 112 (mEq/L)   CO2 35 (*) 19 - 32 (mEq/L)   Glucose, Bld 133 (*) 70 - 99 (mg/dL)   BUN 15  6 - 23 (mg/dL)   Creatinine, Ser 9.14  0.50 - 1.10 (mg/dL)   Calcium 9.6  8.4 - 78.2 (mg/dL)   GFR calc non Af Amer >90  >90 (mL/min)   GFR calc Af Amer >90  >90 (mL/min)  GLUCOSE, CAPILLARY     Status: Abnormal   Collection Time   02/25/11  7:40 AM      Component Value Range   Glucose-Capillary 113 (*) 70 - 99 (mg/dL)   Comment 1 Notify RN      Studies/Results: No results found.  Medications:    . benazepril  10 mg Oral Daily  . ceFEPime (MAXIPIME) IV  2 g Intravenous  Q12H  . Chlorhexidine Gluconate Cloth  6 each Topical Q0600  . citalopram  60 mg Oral Daily  . enoxaparin  40 mg Subcutaneous QHS  . fentaNYL  400 mcg Transdermal Q72H  . fluticasone  2 spray Each Nare Daily  . gabapentin  300 mg Oral QHS  . insulin aspart  0-20 Units Subcutaneous TID WC  . insulin glargine  85 Units Subcutaneous QHS  . levothyroxine  112 mcg Oral Daily  . linagliptin  5 mg Oral Daily  . Liraglutide  1.8 mg Subcutaneous Daily  . metoCLOPramide  10 mg Oral Q8H  . mupirocin ointment  1 application Nasal BID  . nystatin   Topical BID  . polyethylene glycol  17 g Oral Daily  . pregabalin  75 mg Oral TID  . simvastatin  20 mg Oral Daily  . vancomycin  1,000 mg Intravenous Q12H    acetaminophen, acetaminophen, carisoprodol, diazepam, HYDROmorphone, oxyCODONE, oxyCODONE-acetaminophen, senna-docusate     . sodium chloride 75 mL/hr at 02/24/11 1800    Assessment/Plan:  1. Diabetic heel ulcer  S/p I&D and right foot VAC  CT-scan of the heel showed no evidence of osteomyelitis.  Cont IV Vancomycin and Ceftazidime D#7,to complete 2 weeks from last I&D.weekly cbc, bmp and vancomycin level  as per  Dr Zenaida Niece Dam's recommendations .  Continue Wound care ,will need another debridement as per ortho  Possibly on Wednesday. Continue Pain control  2.Diabetes mellitus type 2 with complications, : fair,continue lantus, SSI, hold metformin .  3.Cough: Check CXR , to R/O PNA  4. R hip wound with wound vac : follow up with wound care ,plans to change vac MWF  5. Chronic pain syndrome: continue home dose of narcotics, i.e Fentanyl patch, dilaudid ,percocet  6.Constipation  Continue senokot s and miralax prn.  7.Disposition:  Pending clearance by surgical service. PT recommended SNF ,patient is refusing  Will D/C with  HHPT/wheelchair.     LOS: 7 days   [name] 02/25/2011, 8:41 AM

## 2011-02-25 NOTE — Progress Notes (Signed)
ANTIBIOTIC CONSULT NOTE - Follow up  Pharmacy Consult for Vancomcyin and Cefepime Indication: Infected diabetic foot wound and hip wound  Allergies  Allergen Reactions  . Sulfa Antibiotics Hives    Patient Measurements: Height: 5\' 5"  (165.1 cm) Weight: 451 lb (204.572 kg) IBW/kg (Calculated) : 57    Vital Signs: Temp: 97.6 F (36.4 C) (12/03 0559) Temp src: Oral (12/03 0559) BP: 113/68 mmHg (12/03 0559) Pulse Rate: 69  (12/03 0559) Intake/Output from previous day: 12/02 0701 - 12/03 0700 In: 1970 [P.O.:720; I.V.:900; IV Piggyback:350] Out: 2200 [Urine:2200] Intake/Output from this shift: Total I/O In: 240 [P.O.:240] Out: -   Labs:  Basename 02/25/11 0540  WBC 8.9  HGB 11.1*  PLT 207  LABCREA --  CREATININE 0.79   Estimated Creatinine Clearance: 167.8 ml/min (by C-G formula based on Cr of 0.79). No results found for this basename: VANCOTROUGH:2,VANCOPEAK:2,VANCORANDOM:2,GENTTROUGH:2,GENTPEAK:2,GENTRANDOM:2,TOBRATROUGH:2,TOBRAPEAK:2,TOBRARND:2,AMIKACINPEAK:2,AMIKACINTROU:2,AMIKACIN:2, in the last 72 hours   Microbiology: Recent Results (from the past 720 hour(s))  MRSA PCR SCREENING     Status: Abnormal   Collection Time   02/20/11  1:41 PM      Component Value Range Status Comment   MRSA by PCR POSITIVE (*) NEGATIVE  Final     Medical History: Past Medical History  Diagnosis Date  . Diabetes mellitus   . Hypertension   . Wound healing, delayed   . H/O: Bell's palsy   . Depression   . Fibromyalgia   . DJD (degenerative joint disease)   . Headache   . Asthma   . Right foot drop   . Hypoventilation associated with obesity syndrome   . Hypothyroidism   . Back pain     Medications:  Scheduled:     . benazepril  10 mg Oral Daily  . ceFEPime (MAXIPIME) IV  2 g Intravenous Q12H  . Chlorhexidine Gluconate Cloth  6 each Topical Q0600  . citalopram  60 mg Oral Daily  . enoxaparin  40 mg Subcutaneous QHS  . fentaNYL  400 mcg Transdermal Q72H  .  fluticasone  2 spray Each Nare Daily  . gabapentin  300 mg Oral QHS  . insulin aspart  0-20 Units Subcutaneous TID WC  . insulin glargine  85 Units Subcutaneous QHS  . levothyroxine  112 mcg Oral Daily  . linagliptin  5 mg Oral Daily  . Liraglutide  1.8 mg Subcutaneous Daily  . metoCLOPramide  10 mg Oral Q8H  . mupirocin ointment  1 application Nasal BID  . nystatin   Topical BID  . polyethylene glycol  17 g Oral Daily  . pregabalin  75 mg Oral TID  . simvastatin  20 mg Oral Daily  . vancomycin  1,000 mg Intravenous Q12H   Assessment: 40 yo F with infected diabetic foot wound and hip wound.  Day #7 Vancomycin. Day # 4 Cefepime.  To complete 14 days abx per ID. Afebrile. SCr stable. No culture data.   Plan:  Continue Vanc 1g IV q12h.  Continue Cefepime 2g IV q12h. Check Vanc trough tonight.  Reece Packer 02/25/2011,1:57 PM

## 2011-02-25 NOTE — Progress Notes (Signed)
Physical Therapy Treatment Patient Details Name: [name] MRN: [id number] DOB: [birth year] Today's Date: 02/25/2011 Time: 4098-1191 Charge: TA, W/C management PT Assessment/Plan  PT - Assessment/Plan Comments on Treatment Session: Pt limited by fatigue and pain this treatment session.  Pt reports she will be able to use W/C in home, but currently requiring mod assist at times.  She reports her significant other can assist her.  Recommended more rehab but pt adamant about going home.  Pt unable to ambulate safely with heel floater splint and reports R foot drop from previous back surgery. PT Plan: Discharge plan needs to be updated;Frequency remains appropriate Follow Up Recommendations: Skilled nursing facility (however pt refusing, so HHPT) Equipment Recommended: Wheelchair (measurements) PT Goals  Acute Rehab PT Goals Pt will Propel Wheelchair: 51 - 150 feet;with modified independence PT Goal: Propel Wheelchair - Progress: Progressing toward goal  PT Treatment Precautions/Restrictions  Precautions Precautions: Fall Precaution Comments: wound vac to R hip and heel with ankle splint to float heel Required Braces or Orthoses: Yes Other Brace/Splint: R ankle heel floater, pt also reports using AFO for foot drop PTA Restrictions Other Position/Activity Restrictions: Still no WB status ordered but TDWB so pt does not put pressure through heel  Mobility (including Balance) Bed Mobility Bed Mobility: Yes Sit to Supine - Right: 1: +2 Total assist;HOB elevated (comment degrees);With rail Sit to Supine - Right Details (indicate cue type and reason): pt=50%, required control of lowering trunk and assist with bilateral LEs onto bed 2* reports dizziness upon sitting after transfer over to bed from W/C Transfers Transfers: Yes Sit to Stand: From chair/3-in-1;3: Mod assist;With upper extremity assist Sit to Stand Details (indicate cue type and reason): min assist from [facility] and mod assist  from W/C Stand to Sit: 4: Min assist Stand to Sit Details: assist to control descent, verbal cues for hand placement  Stand Pivot Transfers: 4: Min assist Stand Pivot Transfer Details (indicate cue type and reason): with RW, verbal cues for safe technique and TDWB Wheelchair Mobility Wheelchair Mobility: Yes Wheelchair Assistance: 5: Supervision Wheelchair Assistance Details (indicate cue type and reason): verbal cues for brakes, pt required assist with initiating W/C mobility 2* tight room/obstacles, but once in hallway at supervision level, placed pt's right foot with PRAFO on foot rest, occasional assist of L LE to propel 2* W/C height to high, limited distance 2* to fatigue Wheelchair Propulsion: Both upper extremities;Left lower extremity Distance: 20    Exercise    End of Session PT - End of Session Equipment Utilized During Treatment:  (W/C) Activity Tolerance: Patient limited by fatigue;Patient limited by pain Patient left: in bed;with call bell in reach (with RN) Nurse Communication:  (nsg tech present during mobility) General Behavior During Session: [facility] for tasks performed Cognition: [facility] for tasks performed  Lucylle Foulkes,KATHrine E 02/25/2011, 12:45 PM Pager: [phone]

## 2011-02-26 LAB — BASIC METABOLIC PANEL
BUN: 15 mg/dL (ref 6–23)
CO2: 31 mEq/L (ref 19–32)
Calcium: 9.4 mg/dL (ref 8.4–10.5)
Chloride: 101 mEq/L (ref 96–112)
Creatinine, Ser: 0.71 mg/dL (ref 0.50–1.10)
GFR calc Af Amer: >90 mL/min (ref >90)
GFR calc non Af Amer: >90 mL/min (ref >90)
Glucose, Bld: 162 mg/dL — ABNORMAL HIGH (ref 70–99)
Potassium: 4.2 mEq/L (ref 3.5–5.1)
Sodium: 137 mEq/L (ref 135–145)

## 2011-02-26 LAB — CBC
HCT: 34 % — ABNORMAL LOW (ref 36.0–46.0)
Hemoglobin: 10.8 g/dL — ABNORMAL LOW (ref 12.0–15.0)
MCH: 30.3 pg (ref 26.0–34.0)
MCHC: 31.8 g/dL (ref 30.0–36.0)
MCV: 95.2 fL (ref 78.0–100.0)
Platelets: 226 10*3/uL (ref 150–400)
RBC: 3.57 MIL/uL — ABNORMAL LOW (ref 3.87–5.11)
RDW: 12.7 % (ref 11.5–15.5)
WBC: 11.7 10*3/uL — ABNORMAL HIGH (ref 4.0–10.5)

## 2011-02-26 LAB — GLUCOSE, CAPILLARY
Glucose-Capillary: 125 mg/dL — ABNORMAL HIGH (ref 70–99)
Glucose-Capillary: 181 mg/dL — ABNORMAL HIGH (ref 70–99)
Glucose-Capillary: 162 mg/dL — ABNORMAL HIGH (ref 70–99)
Glucose-Capillary: 229 mg/dL — ABNORMAL HIGH (ref 70–99)

## 2011-02-26 LAB — VANCOMYCIN, TROUGH: Vancomycin Tr: 15.6 ug/mL (ref 10.0–20.0)

## 2011-02-26 MED ORDER — ALBUTEROL SULFATE (5 MG/ML) 0.5% IN NEBU
2.5000 mg | INHALATION_SOLUTION | RESPIRATORY_TRACT | Status: DC | PRN
  Administered 2011-02-27 – 2011-03-04 (×6): 2.5 mg via RESPIRATORY_TRACT
  Filled 2011-02-26 (×7): qty 0.5

## 2011-02-26 MED ORDER — FUROSEMIDE 10 MG/ML IJ SOLN
20.0000 mg | Freq: Once | INTRAMUSCULAR | Status: AC
  Administered 2011-02-26: 20 mg via INTRAVENOUS

## 2011-02-26 MED ORDER — FUROSEMIDE 10 MG/ML IJ SOLN
INTRAMUSCULAR | Status: AC
Start: 2011-02-26 — End: 2011-02-27
  Filled 2011-02-26: qty 4

## 2011-02-26 NOTE — Progress Notes (Signed)
Off service note HPI:   [name] is a 40 year old Caucasian female with multiple medical problems including morbid obesity, diabetes poorly controlled, right heel, and right hip poorly healing wounds, hypertension, and chronic pain syndrome. She had noticed that her right heel ulcer and wound was not responding getting worse over the last 3 days. She notices streaking marks going up her right leg from the heel wound. She has a wound VAC for her right hip wound and special special dressing and boot that she wears for her right heel wound. She is followed at the wound clinic.  Hospital course 1. Diabetic heel ulcer  S/p I&D and right foot VAC  CT-scan of the heel showed no evidence of osteomyelitis.  Cont IV Vancomycin and Ceftazidime D#8,to complete 2 weeks from last I&D.weekly cbc, bmp and vancomycin level as per Dr Zenaida Niece Dam's recommendations .  Continue Wound care ,will need another debridement as per ortho Possibly tomorrow.  Continue Pain control  2.Diabetes mellitus type 2 with complications, : fair,continue lantus, SSI, hold metformin .  3.Cough/S0B:   CXR showed no acute process, IV fluid was discontinued and patient given a dose of Lasix and nebulizer treatment with improvement. 4. R hip wound with wound vac : follow up with wound care ,plans to change vac MWF  5. Chronic pain syndrome: continue home dose of narcotics, i.e Fentanyl patch, dilaudid ,percocet , her chronic pain is difficult to control. 6.Constipation  Continue senokot s and miralax prn.  7.Disposition:  Pending clearance by surgical service. PT recommended SNF ,patient is refusing  Will need HHPT/wheelchair on discharge.  Consults  Orthopedic service  Infectious disease (Dr. Algis Liming)    Subjective: Patient seen and examined, was complaining of shortness of breath earlier currently improving after given Lasix.  Objective: Vital signs in last 24 hours: Temp:  [97.7 F (36.5 C)-98 F (36.7 C)] 97.7 F (36.5 C)  (12/04 1407) Pulse Rate:  [73-85] 73  (12/04 1407) Resp:  [18] 18  (12/04 1407) BP: (96-106)/(63-70) 106/70 mmHg (12/04 1407) SpO2:  [97 %-99 %] 99 % (12/04 1407) Weight change:  Last BM Date: 02/25/11  Intake/Output from previous day: 12/03 0701 - 12/04 0700 In: 1005 [P.O.:480; I.V.:525] Out: 3100 [Urine:3100] Total I/O In: 600 [P.O.:600] Out: 2300 [Urine:2300]   Physical Exam: General: Alert, awake, oriented x3, in no acute distress.  Heart: Regular rate and rhythm, without murmurs, rubs, gallops.  Lungs: Clear to auscultation bilaterally. Decreased BS.  Abdomen: Soft,OBESE, nontender, nondistended, positive bowel sounds.  Rt thigh VAC  Extremities: right foot dressing/VAC in place  Neuro: moves extremities ,intact non focal exam.    Lab Results: Results for orders placed during the hospital encounter of 02/18/11 (from the past 24 hour(s))  GLUCOSE, CAPILLARY     Status: Abnormal   Collection Time   02/25/11  9:31 PM      Component Value Range   Glucose-Capillary 182 (*) 70 - 99 (mg/dL)   Comment 1 Notify RN     Comment 2 Documented in Chart    CBC     Status: Abnormal   Collection Time   02/26/11  2:36 AM      Component Value Range   WBC 11.7 (*) 4.0 - 10.5 (K/uL)   RBC 3.57 (*) 3.87 - 5.11 (MIL/uL)   Hemoglobin 10.8 (*) 12.0 - 15.0 (g/dL)   HCT 40.9 (*) 81.1 - 46.0 (%)   MCV 95.2  78.0 - 100.0 (fL)   MCH 30.3  26.0 - 34.0 (  pg)   MCHC 31.8  30.0 - 36.0 (g/dL)   RDW 16.1  09.6 - 04.5 (%)   Platelets 226  150 - 400 (K/uL)  BASIC METABOLIC PANEL     Status: Abnormal   Collection Time   02/26/11  2:36 AM      Component Value Range   Sodium 137  135 - 145 (mEq/L)   Potassium 4.2  3.5 - 5.1 (mEq/L)   Chloride 101  96 - 112 (mEq/L)   CO2 31  19 - 32 (mEq/L)   Glucose, Bld 162 (*) 70 - 99 (mg/dL)   BUN 15  6 - 23 (mg/dL)   Creatinine, Ser 4.09  0.50 - 1.10 (mg/dL)   Calcium 9.4  8.4 - 81.1 (mg/dL)   GFR calc non Af Amer >90  >90 (mL/min)   GFR calc Af Amer >90   >90 (mL/min)  GLUCOSE, CAPILLARY     Status: Abnormal   Collection Time   02/26/11  7:14 AM      Component Value Range   Glucose-Capillary 125 (*) 70 - 99 (mg/dL)  GLUCOSE, CAPILLARY     Status: Abnormal   Collection Time   02/26/11 12:03 PM      Component Value Range   Glucose-Capillary 181 (*) 70 - 99 (mg/dL)  VANCOMYCIN, TROUGH     Status: Normal   Collection Time   02/26/11 12:30 PM      Component Value Range   Vancomycin Tr 15.6  10.0 - 20.0 (ug/mL)    Studies/Results: Dg Chest [employer] .  IMPRESSION:  Poor inspiration.  Stable cardiomegaly.  No definite active process.  Original Report Authenticated By: Juline Patch, M.D.    Medications:    . benazepril  10 mg Oral Daily  . ceFEPime (MAXIPIME) IV  2 g Intravenous Q12H  . Chlorhexidine Gluconate Cloth  6 each Topical Q0600  . citalopram  60 mg Oral Daily  . enoxaparin  40 mg Subcutaneous QHS  . fentaNYL  400 mcg Transdermal Q72H  . fluticasone  2 spray Each Nare Daily  . furosemide      . furosemide  20 mg Intravenous Once  . gabapentin  300 mg Oral QHS  . insulin aspart  0-20 Units Subcutaneous TID WC  . insulin glargine  85 Units Subcutaneous QHS  . levothyroxine  112 mcg Oral Daily  . linagliptin  5 mg Oral Daily  . Liraglutide  1.8 mg Subcutaneous Daily  . metoCLOPramide  10 mg Oral Q8H  . nystatin   Topical BID  . polyethylene glycol  17 g Oral Daily  . pregabalin  75 mg Oral TID  . simvastatin  20 mg Oral Daily  . vancomycin  1,000 mg Intravenous Q12H    acetaminophen, acetaminophen, albuterol, carisoprodol, diazepam, HYDROmorphone, oxyCODONE, oxyCODONE-acetaminophen, senna-docusate     . DISCONTD: sodium chloride 75 mL/hr at 02/26/11 0026    Assessment/Plan:  As above   LOS: 8 days   Jacqulynn Shappell 02/26/2011, 4:34 PM

## 2011-02-26 NOTE — Progress Notes (Signed)
ANTIBIOTIC CONSULT NOTE - Follow up  Pharmacy Consult for Vancomcyin and Cefepime Indication: Infected diabetic foot wound and hip wound  Allergies  Allergen Reactions  . Sulfa Antibiotics Hives    Patient Measurements: Height: 5\' 5"  (165.1 cm) Weight: 451 lb (204.572 kg) IBW/kg (Calculated) : 57    Vital Signs: Temp: 97.7 F (36.5 C) (12/04 1407) Temp src: Oral (12/04 1407) BP: 106/70 mmHg (12/04 1407) Pulse Rate: 73  (12/04 1407)  Labs:  Basename 02/26/11 0236 02/25/11 0540  WBC 11.7* 8.9  HGB 10.8* 11.1*  PLT 226 207  LABCREA -- --  CREATININE 0.71 0.79   Estimated Creatinine Clearance: 167.8 ml/min (by C-G formula based on Cr of 0.71).  Basename 02/26/11 1230  VANCOTROUGH 15.6  VANCOPEAK --  VANCORANDOM --  GENTTROUGH --  GENTPEAK --  GENTRANDOM --  TOBRATROUGH --  TOBRAPEAK --  TOBRARND --  AMIKACINPEAK --  AMIKACINTROU --  AMIKACIN --     Microbiology: Recent Results (from the past 720 hour(s))  MRSA PCR SCREENING     Status: Abnormal   Collection Time   02/20/11  1:41 PM      Component Value Range Status Comment   MRSA by PCR POSITIVE (*) NEGATIVE  Final    Medications:  Scheduled:     . benazepril  10 mg Oral Daily  . ceFEPime (MAXIPIME) IV  2 g Intravenous Q12H  . Chlorhexidine Gluconate Cloth  6 each Topical Q0600  . citalopram  60 mg Oral Daily  . enoxaparin  40 mg Subcutaneous QHS  . fentaNYL  400 mcg Transdermal Q72H  . fluticasone  2 spray Each Nare Daily  . furosemide      . furosemide  20 mg Intravenous Once  . gabapentin  300 mg Oral QHS  . insulin aspart  0-20 Units Subcutaneous TID WC  . insulin glargine  85 Units Subcutaneous QHS  . levothyroxine  112 mcg Oral Daily  . linagliptin  5 mg Oral Daily  . Liraglutide  1.8 mg Subcutaneous Daily  . metoCLOPramide  10 mg Oral Q8H  . nystatin   Topical BID  . polyethylene glycol  17 g Oral Daily  . pregabalin  75 mg Oral TID  . simvastatin  20 mg Oral Daily  . vancomycin   1,000 mg Intravenous Q12H   Assessment: 40 yo F with infected diabetic foot wound and hip wound.  Day #8/14 Vancomycin. Day # 5/14 Cefepime.  To complete 14 days abx per ID. Afebrile. No cultures ordered. Vanc trough 15.6, low end of desired range 15-20 mcg/ml  Plan:  Continue Vanc 1g IV q12h.  Continue Cefepime 2g IV q12h.  Chilton Si, Ah Bott L 02/26/2011,2:13 PM

## 2011-02-27 ENCOUNTER — Other Ambulatory Visit: Payer: Self-pay

## 2011-02-27 LAB — BASIC METABOLIC PANEL
BUN: 16 mg/dL (ref 6–23)
CO2: 32 mEq/L (ref 19–32)
Calcium: 9.7 mg/dL (ref 8.4–10.5)
Chloride: 99 mEq/L (ref 96–112)
Creatinine, Ser: 0.71 mg/dL (ref 0.50–1.10)
GFR calc Af Amer: >90 mL/min (ref >90)
GFR calc non Af Amer: >90 mL/min (ref >90)
Glucose, Bld: 169 mg/dL — ABNORMAL HIGH (ref 70–99)
Potassium: 4.1 mEq/L (ref 3.5–5.1)
Sodium: 136 mEq/L (ref 135–145)

## 2011-02-27 LAB — CBC
HCT: 34 % — ABNORMAL LOW (ref 36.0–46.0)
Hemoglobin: 10.4 g/dL — ABNORMAL LOW (ref 12.0–15.0)
MCH: 29.1 pg (ref 26.0–34.0)
MCHC: 30.6 g/dL (ref 30.0–36.0)
MCV: 95 fL (ref 78.0–100.0)
Platelets: 191 10*3/uL (ref 150–400)
RBC: 3.58 MIL/uL — ABNORMAL LOW (ref 3.87–5.11)
RDW: 12.6 % (ref 11.5–15.5)
WBC: 11.2 10*3/uL — ABNORMAL HIGH (ref 4.0–10.5)

## 2011-02-27 LAB — GLUCOSE, CAPILLARY
Glucose-Capillary: 150 mg/dL — ABNORMAL HIGH (ref 70–99)
Glucose-Capillary: 185 mg/dL — ABNORMAL HIGH (ref 70–99)
Glucose-Capillary: 148 mg/dL — ABNORMAL HIGH (ref 70–99)
Glucose-Capillary: 179 mg/dL — ABNORMAL HIGH (ref 70–99)

## 2011-02-27 MED ORDER — LORATADINE 10 MG PO TABS
10.0000 mg | ORAL_TABLET | Freq: Every day | ORAL | Status: DC
  Administered 2011-02-27 – 2011-03-04 (×6): 10 mg via ORAL
  Filled 2011-02-27 (×7): qty 1

## 2011-02-27 MED ORDER — ENOXAPARIN SODIUM 100 MG/ML ~~LOC~~ SOLN
100.0000 mg | Freq: Every day | SUBCUTANEOUS | Status: DC
  Administered 2011-02-27 – 2011-03-07 (×9): 100 mg via SUBCUTANEOUS
  Filled 2011-02-27 (×11): qty 1

## 2011-02-27 MED ORDER — PSEUDOEPHEDRINE HCL ER 120 MG PO TB12
120.0000 mg | ORAL_TABLET | Freq: Two times a day (BID) | ORAL | Status: DC
  Administered 2011-02-27 – 2011-03-08 (×17): 120 mg via ORAL
  Filled 2011-02-27 (×22): qty 1

## 2011-02-27 NOTE — Progress Notes (Signed)
Subjective: Patient seen and examined ,C/O congestion. Patient also c/o r heel pain.  Objective: Vital signs in last 24 hours: Temp:  [98.2 F (36.8 C)-98.5 F (36.9 C)] 98.2 F (36.8 C) (12/05 1431) Pulse Rate:  [75-79] 79  (12/05 1431) Resp:  [18-19] 19  (12/05 1431) BP: (100-114)/(65-73) 108/67 mmHg (12/05 1431) SpO2:  [92 %-95 %] 95 % (12/05 1431) Weight change:  Last BM Date: 02/24/11  Intake/Output from previous day: 12/04 0701 - 12/05 0700 In: 600 [P.O.:600] Out: 4850 [Urine:4850] Total I/O In: 660 [P.O.:660] Out: 850 [Urine:850]   Physical Exam: General: Alert, awake, oriented x3, in no acute distress.  Heart: Regular rate and rhythm, without murmurs, rubs, gallops. Distant heart sounds. Lungs: Clear to auscultation bilaterally. Decreased BS. Abdomen: Soft,OBESE, nontender, nondistended, positive bowel sounds.  Rt thigh VAC  Extremities: right foot dressing/VAC in place  Neuro: moves extremities ,intact non focal exam.    Lab Results: Results for orders placed during the hospital encounter of 02/18/11 (from the past 24 hour(s))  GLUCOSE, CAPILLARY     Status: Abnormal   Collection Time   02/26/11  4:42 PM      Component Value Range   Glucose-Capillary 162 (*) 70 - 99 (mg/dL)   Comment 1 Notify RN     Comment 2 Documented in Chart    GLUCOSE, CAPILLARY     Status: Abnormal   Collection Time   02/26/11  9:37 PM      Component Value Range   Glucose-Capillary 229 (*) 70 - 99 (mg/dL)   Comment 1 Notify RN    CBC     Status: Abnormal   Collection Time   02/27/11  5:17 AM      Component Value Range   WBC 11.2 (*) 4.0 - 10.5 (K/uL)   RBC 3.58 (*) 3.87 - 5.11 (MIL/uL)   Hemoglobin 10.4 (*) 12.0 - 15.0 (g/dL)   HCT 16.1 (*) 09.6 - 46.0 (%)   MCV 95.0  78.0 - 100.0 (fL)   MCH 29.1  26.0 - 34.0 (pg)   MCHC 30.6  30.0 - 36.0 (g/dL)   RDW 04.5  40.9 - 81.1 (%)   Platelets 191  150 - 400 (K/uL)  BASIC METABOLIC PANEL     Status: Abnormal   Collection Time   02/27/11  5:17 AM      Component Value Range   Sodium 136  135 - 145 (mEq/L)   Potassium 4.1  3.5 - 5.1 (mEq/L)   Chloride 99  96 - 112 (mEq/L)   CO2 32  19 - 32 (mEq/L)   Glucose, Bld 169 (*) 70 - 99 (mg/dL)   BUN 16  6 - 23 (mg/dL)   Creatinine, Ser 9.14  0.50 - 1.10 (mg/dL)   Calcium 9.7  8.4 - 78.2 (mg/dL)   GFR calc non Af Amer >90  >90 (mL/min)   GFR calc Af Amer >90  >90 (mL/min)  GLUCOSE, CAPILLARY     Status: Abnormal   Collection Time   02/27/11  7:48 AM      Component Value Range   Glucose-Capillary 150 (*) 70 - 99 (mg/dL)   Comment 1 Notify RN    GLUCOSE, CAPILLARY     Status: Abnormal   Collection Time   02/27/11 12:00 PM      Component Value Range   Glucose-Capillary 185 (*) 70 - 99 (mg/dL)    Studies/Results: Dg Chest Port 1 View  02/25/2011  *RADIOLOGY REPORT*  Clinical Data: Cough,  shortness of breath  PORTABLE CHEST - 1 VIEW  Comparison: Chest x-ray of 06/25/2003  Findings: The lungs are not well aerated.  No focal infiltrate or effusion is seen.  Cardiomegaly is stable.  No bony abnormality is seen.  IMPRESSION:  Poor inspiration.  Stable cardiomegaly.  No definite active process.  Original Report Authenticated By: Juline Patch, M.D.    Medications:    . benazepril  10 mg Oral Daily  . ceFEPime (MAXIPIME) IV  2 g Intravenous Q12H  . citalopram  60 mg Oral Daily  . enoxaparin  100 mg Subcutaneous QHS  . fentaNYL  400 mcg Transdermal Q72H  . fluticasone  2 spray Each Nare Daily  . furosemide      . gabapentin  300 mg Oral QHS  . insulin aspart  0-20 Units Subcutaneous TID WC  . insulin glargine  85 Units Subcutaneous QHS  . levothyroxine  112 mcg Oral Daily  . linagliptin  5 mg Oral Daily  . Liraglutide  1.8 mg Subcutaneous Daily  . metoCLOPramide  10 mg Oral Q8H  . nystatin   Topical BID  . polyethylene glycol  17 g Oral Daily  . pregabalin  75 mg Oral TID  . simvastatin  20 mg Oral Daily  . vancomycin  1,000 mg Intravenous Q12H  . DISCONTD:  enoxaparin  40 mg Subcutaneous QHS    acetaminophen, acetaminophen, albuterol, carisoprodol, diazepam, HYDROmorphone, oxyCODONE, oxyCODONE-acetaminophen, senna-docusate     Assessment/Plan:  1. Diabetic heel ulcer  S/p I&D and right foot VAC  CT-scan of the heel showed no evidence of osteomyelitis.  Cont IV Vancomycin and Ceftazidime D#9,to complete 2 weeks from last I&D.weekly cbc, bmp and vancomycin level as per  Dr Zenaida Niece Dam's recommendations .  Continue Wound care ,may need another debridement as per ortho.   Continue Pain control  2.Diabetes mellitus type 2 with complications, : fair,continue lantus, SSI, hold metformin .  3.Cough:  CXR negative for PNA. Symptomatic treatment. 4. R hip wound with wound vac : follow up with wound care ,plans to change vac MWF  5. Chronic pain syndrome: continue home dose of narcotics, i.e Fentanyl patch, dilaudid ,percocet  6.Constipation  Continue senokot s and miralax prn.  7.Disposition:  Pending clearance by surgical service. PT recommended SNF ,patient is refusing  Will D/C with  HHPT/wheelchair.     LOS: 9 days   Mason Burleigh 02/27/2011, 3:23 PM

## 2011-02-27 NOTE — Progress Notes (Signed)
Pt stable Vac changed r heel 70/30 good grannulation Do not anticipate need for formal surgical debridement Will recheck Monday here or in office May require superficial debridement that could be done at bedside but overall it is much improved

## 2011-02-28 DIAGNOSIS — E11621 Type 2 diabetes mellitus with foot ulcer: Secondary | ICD-10-CM | POA: Diagnosis present

## 2011-02-28 LAB — BASIC METABOLIC PANEL
BUN: 14 mg/dL (ref 6–23)
CO2: 34 mEq/L — ABNORMAL HIGH (ref 19–32)
Calcium: 10 mg/dL (ref 8.4–10.5)
Chloride: 99 mEq/L (ref 96–112)
Creatinine, Ser: 0.76 mg/dL (ref 0.50–1.10)
GFR calc Af Amer: >90 mL/min (ref >90)
GFR calc non Af Amer: >90 mL/min (ref >90)
Glucose, Bld: 165 mg/dL — ABNORMAL HIGH (ref 70–99)
Potassium: 4.3 mEq/L (ref 3.5–5.1)
Sodium: 138 mEq/L (ref 135–145)

## 2011-02-28 LAB — CBC
HCT: 34.9 % — ABNORMAL LOW (ref 36.0–46.0)
Hemoglobin: 10.9 g/dL — ABNORMAL LOW (ref 12.0–15.0)
MCH: 30.1 pg (ref 26.0–34.0)
MCHC: 31.2 g/dL (ref 30.0–36.0)
MCV: 96.4 fL (ref 78.0–100.0)
Platelets: 181 10*3/uL (ref 150–400)
RBC: 3.62 MIL/uL — ABNORMAL LOW (ref 3.87–5.11)
RDW: 12.8 % (ref 11.5–15.5)
WBC: 11.3 10*3/uL — ABNORMAL HIGH (ref 4.0–10.5)

## 2011-02-28 LAB — GLUCOSE, CAPILLARY
Glucose-Capillary: 217 mg/dL — ABNORMAL HIGH (ref 70–99)
Glucose-Capillary: 193 mg/dL — ABNORMAL HIGH (ref 70–99)
Glucose-Capillary: 198 mg/dL — ABNORMAL HIGH (ref 70–99)

## 2011-02-28 NOTE — Progress Notes (Signed)
Subjective: Patient with some c/o heel pain.  Objective: Vital signs in last 24 hours: Temp:  [97.9 F (36.6 C)-98.5 F (36.9 C)] 98.3 F (36.8 C) (12/06 0711) Pulse Rate:  [68-97] 68  (12/06 0711) Resp:  [18-19] 18  (12/06 0711) BP: (108-161)/(60-94) 161/60 mmHg (12/06 0711) SpO2:  [95 %-99 %] 97 % (12/06 0711) Weight change:  Last BM Date: 02/24/11  Intake/Output from previous day: 12/05 0701 - 12/06 0700 In: 1280 [P.O.:660; I.V.:120; IV Piggyback:500] Out: 3950 [Urine:3950] Total I/O In: 360 [P.O.:360] Out: 1400 [Urine:1400]   Physical Exam: General: Alert, awake, oriented x3, in no acute distress.  Heart: Regular rate and rhythm, without murmurs, rubs, gallops. Distant heart sounds. Lungs: Clear to auscultation bilaterally anterior lung fields. Decreased BS. Abdomen: Soft,OBESE, nontender, nondistended, positive bowel sounds.  Rt thigh VAC  Extremities: right foot dressing/VAC in place. Boot on. Neuro: moves extremities ,intact non focal exam.    Lab Results: Results for orders placed during the hospital encounter of 02/18/11 (from the past 24 hour(s))  GLUCOSE, CAPILLARY     Status: Abnormal   Collection Time   02/27/11  5:25 PM      Component Value Range   Glucose-Capillary 148 (*) 70 - 99 (mg/dL)   Comment 1 Notify RN    GLUCOSE, CAPILLARY     Status: Abnormal   Collection Time   02/27/11 10:07 PM      Component Value Range   Glucose-Capillary 179 (*) 70 - 99 (mg/dL)   Comment 1 Notify RN    BASIC METABOLIC PANEL     Status: Abnormal   Collection Time   02/28/11  5:25 AM      Component Value Range   Sodium 138  135 - 145 (mEq/L)   Potassium 4.3  3.5 - 5.1 (mEq/L)   Chloride 99  96 - 112 (mEq/L)   CO2 34 (*) 19 - 32 (mEq/L)   Glucose, Bld 165 (*) 70 - 99 (mg/dL)   BUN 14  6 - 23 (mg/dL)   Creatinine, Ser 1.61  0.50 - 1.10 (mg/dL)   Calcium 09.6  8.4 - 10.5 (mg/dL)   GFR calc non Af Amer >90  >90 (mL/min)   GFR calc Af Amer >90  >90 (mL/min)  CBC      Status: Abnormal   Collection Time   02/28/11  5:25 AM      Component Value Range   WBC 11.3 (*) 4.0 - 10.5 (K/uL)   RBC 3.62 (*) 3.87 - 5.11 (MIL/uL)   Hemoglobin 10.9 (*) 12.0 - 15.0 (g/dL)   HCT 04.5 (*) 40.9 - 46.0 (%)   MCV 96.4  78.0 - 100.0 (fL)   MCH 30.1  26.0 - 34.0 (pg)   MCHC 31.2  30.0 - 36.0 (g/dL)   RDW 81.1  91.4 - 78.2 (%)   Platelets 181  150 - 400 (K/uL)  GLUCOSE, CAPILLARY     Status: Abnormal   Collection Time   02/28/11  1:05 PM      Component Value Range   Glucose-Capillary 217 (*) 70 - 99 (mg/dL)    Studies/Results: No results found.  Medications:    . benazepril  10 mg Oral Daily  . ceFEPime (MAXIPIME) IV  2 g Intravenous Q12H  . citalopram  60 mg Oral Daily  . enoxaparin  100 mg Subcutaneous QHS  . fentaNYL  400 mcg Transdermal Q72H  . fluticasone  2 spray Each Nare Daily  . gabapentin  300 mg Oral  QHS  . insulin aspart  0-20 Units Subcutaneous TID WC  . insulin glargine  85 Units Subcutaneous QHS  . levothyroxine  112 mcg Oral Daily  . linagliptin  5 mg Oral Daily  . Liraglutide  1.8 mg Subcutaneous Daily  . loratadine  10 mg Oral Daily  . metoCLOPramide  10 mg Oral Q8H  . nystatin   Topical BID  . polyethylene glycol  17 g Oral Daily  . pregabalin  75 mg Oral TID  . pseudoephedrine  120 mg Oral BID  . simvastatin  20 mg Oral Daily  . vancomycin  1,000 mg Intravenous Q12H  . DISCONTD: enoxaparin  40 mg Subcutaneous QHS    acetaminophen, acetaminophen, albuterol, carisoprodol, diazepam, HYDROmorphone, oxyCODONE, oxyCODONE-acetaminophen, senna-docusate     Assessment/Plan:  1. Diabetic heel ulcer  S/p I&D and right foot VAC  CT-scan of the heel showed no evidence of osteomyelitis.  Cont IV Vancomycin and Ceftazidime D#10,to complete 2 weeks from last I&D.weekly cbc, bmp and vancomycin level as per  Dr Zenaida Niece Dam's recommendations .  Continue Wound care ,may need another debridement as per ortho.  Ortho to reasses on Monday. Continue Pain  control  2.Diabetes mellitus type 2 with complications, : fair,continue lantus, SSI, hold metformin .  3.Cough:  CXR negative for PNA. Symptomatic treatment. 4. R hip wound with wound vac : follow up with wound care ,plans to change vac MWF  5. Chronic pain syndrome: continue home dose of narcotics, i.e Fentanyl patch, dilaudid ,percocet  6.Constipation  Continue senokot s and miralax prn.  7.Disposition:  Pending clearance by surgical service. PT recommended SNF ,patient is refusing  Will D/C with  HHPT/wheelchair, when stable for D/C.     LOS: 10 days   [name] 02/28/2011, 2:10 PM

## 2011-02-28 NOTE — Progress Notes (Signed)
Physical Therapy Treatment Patient Details Name: [name] MRN: [id number] DOB: August 23, 1970 Today's Date: 02/28/2011 Time: 4098-1191    2 TA PT Assessment/Plan  PT - Assessment/Plan Comments on Treatment Session: Pt reports spouse will be able to assist at home 24/7. Discussed orthosis for R foot to allow for ambulation-pt stated she had consult with [employer] prior to this admission and was unable to get "boot" b/c insurance would not cover unless she was inpatient in hospital. May be beneficial to have [employer] consult.? PT is not aware of any boot/orthosis that will accomodate wound vac and limit weight-bearing on heel.  Pt will also likely need wide wheelchair with R elevating legrest . Wheelchair "bump" technique with +2 assist for safety or ambulance transport will be needed for entry into home . PT Plan: Discharge plan remains appropriate Follow Up Recommendations: Home health PT;24 hour supervision/assistance (pt refusing SNF) Equipment Recommended: Wheelchair (measurements);Wheelchair cushion (measurements) (R elevating legrest for wheelchair) PT Goals  Acute Rehab PT Goals PT Goal: Supine/Side to Sit - Progress: Progressing toward goal PT Goal: Ambulate - Progress: Progressing toward goal (limited by wound vac, R foot drop, WBing status)  PT Treatment Precautions/Restrictions  Precautions Precautions: Fall Precaution Comments: wound vac to R hip and heel with ankle splint to float heel Required Braces or Orthoses: Yes Other Brace/Splint: R ankle splint to heel Restrictions Other Position/Activity Restrictions: Utilized toe-touch/TDWB on forefoot so as to restrict weight through heel Mobility (including Balance) Bed Mobility Bed Mobility: Yes Supine to Sit: 3: Mod assist Supine to Sit Details (indicate cue type and reason): VCs technique, hand placement. Assist for R LE and trunk to upright.  Increased time.  Transfers Transfers: Yes Sit to Stand: From bed;From  chair/3-in-1;With upper extremity assist Sit to Stand Details (indicate cue type and reason): x 2 for activity tolerance, strengthening. VCs safety, technique, hand placement. Assist tor rise, stabilize. Stand to Sit: To chair/3-in-1;With upper extremity assist;With armrests Stand to Sit Details: x 2 for activity tolerance, strengthening.VCs safety, technique, hand placement. Assist to control descent.  Stand Pivot Transfers: 4: Min assist Stand Pivot Transfer Details (indicate cue type and reason): with RW. x2 for activity tolerance, strengthening. VCs safety, technique, adherence to toe-touch/TDWB.  Ambulation/Gait Ambulation/Gait: No Stairs: No    Exercise    End of Session PT - End of Session Equipment Utilized During Treatment:  (Wheelchair, R ankle splint for transfers only) Activity Tolerance: Patient limited by pain Patient left: in chair;with call bell in reach Nurse Communication:  (RN/tech present during session) General Behavior During Session: [facility] for tasks performed Cognition: [facility] for tasks performed  Rebeca Alert [facility] 02/28/2011, 4:49 PM

## 2011-03-01 ENCOUNTER — Inpatient Hospital Stay (HOSPITAL_COMMUNITY): Payer: Medicare Other

## 2011-03-01 ENCOUNTER — Other Ambulatory Visit (HOSPITAL_COMMUNITY): Payer: Medicare Other

## 2011-03-01 DIAGNOSIS — R5383 Other fatigue: Secondary | ICD-10-CM | POA: Diagnosis not present

## 2011-03-01 LAB — GLUCOSE, CAPILLARY
Glucose-Capillary: 138 mg/dL — ABNORMAL HIGH (ref 70–99)
Glucose-Capillary: 141 mg/dL — ABNORMAL HIGH (ref 70–99)
Glucose-Capillary: 189 mg/dL — ABNORMAL HIGH (ref 70–99)
Glucose-Capillary: 194 mg/dL — ABNORMAL HIGH (ref 70–99)
Glucose-Capillary: 268 mg/dL — ABNORMAL HIGH (ref 70–99)
Glucose-Capillary: 204 mg/dL — ABNORMAL HIGH (ref 70–99)

## 2011-03-01 LAB — BASIC METABOLIC PANEL
BUN: 18 mg/dL (ref 6–23)
CO2: 38 mEq/L — ABNORMAL HIGH (ref 19–32)
Calcium: 10.1 mg/dL (ref 8.4–10.5)
Chloride: 95 mEq/L — ABNORMAL LOW (ref 96–112)
Creatinine, Ser: 0.85 mg/dL (ref 0.50–1.10)
GFR calc Af Amer: >90 mL/min (ref >90)
GFR calc non Af Amer: 83 mL/min — ABNORMAL LOW (ref >90)
Glucose, Bld: 171 mg/dL — ABNORMAL HIGH (ref 70–99)
Potassium: 3.9 mEq/L (ref 3.5–5.1)
Sodium: 137 mEq/L (ref 135–145)

## 2011-03-01 LAB — CBC
HCT: 36 % (ref 36.0–46.0)
Hemoglobin: 11.2 g/dL — ABNORMAL LOW (ref 12.0–15.0)
MCH: 29.9 pg (ref 26.0–34.0)
MCHC: 31.1 g/dL (ref 30.0–36.0)
MCV: 96.3 fL (ref 78.0–100.0)
Platelets: 173 10*3/uL (ref 150–400)
RBC: 3.74 MIL/uL — ABNORMAL LOW (ref 3.87–5.11)
RDW: 12.8 % (ref 11.5–15.5)
WBC: 10.1 10*3/uL (ref 4.0–10.5)

## 2011-03-01 IMAGING — CR DG CHEST 1V PORT
1 series · 1 of 1 positions shown · non-contrast
Comparison: Portable exam [6D] hours compared to [DATE]

CLINICAL DATA: Shortness of breath

PORTABLE CHEST - 1 VIEW

[AP]
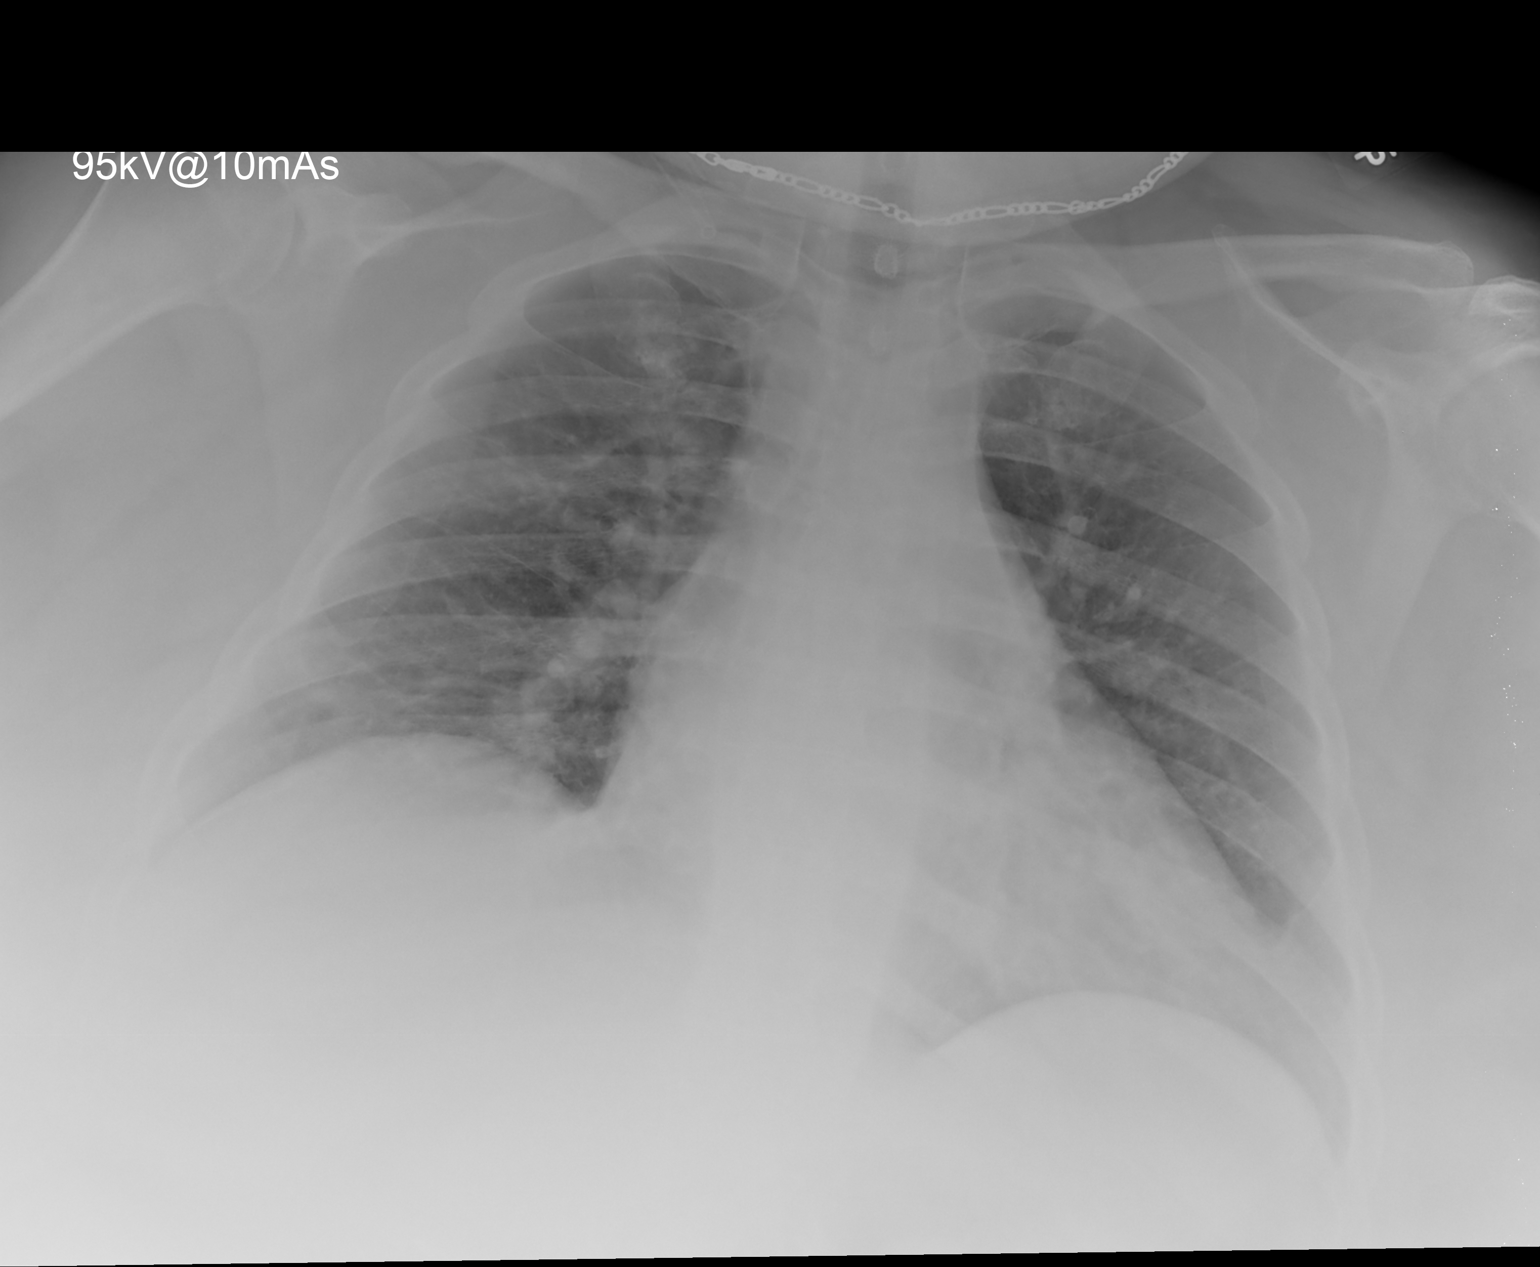

[1 of 1 positions shown; findings below may reference images not displayed]

FINDINGS: Enlargement of cardiac silhouette.
Slight pulmonary vascular congestion.
Mediastinal contours normal.
Minimal right basilar atelectasis.
Lungs otherwise clear.
No pleural effusion or pneumothorax.
IMPRESSION: Enlargement of cardiac silhouette with minimal right basilar
atelectasis.

## 2011-03-01 IMAGING — CT CT HEAD W/O CM
1 of 2 series · 16 of 30 positions shown, 20 images · non-contrast
Comparison: CT [DATE]

CLINICAL DATA: Altered mental status.

CT HEAD WITHOUT CONTRAST
TECHNIQUE: Contiguous axial images were obtained from the base of
the skull through the vertex without contrast.

[Series 3: headseq 4.8 h45s · axial · 0.43mm/px · z∈[-162,-38]mm · 16 of 30 slices shown, 20 images]
[im 2/30  brain]
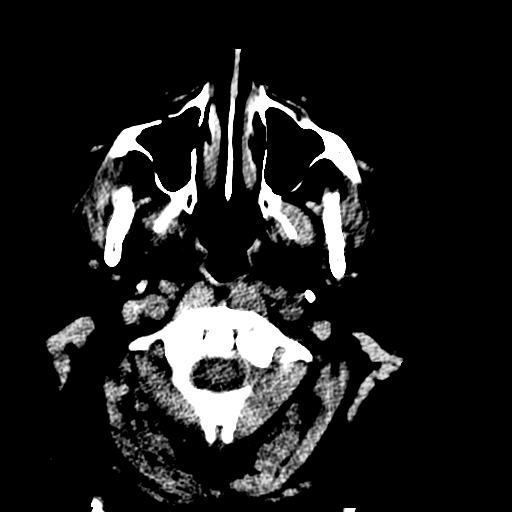
[im 2/30  bone]
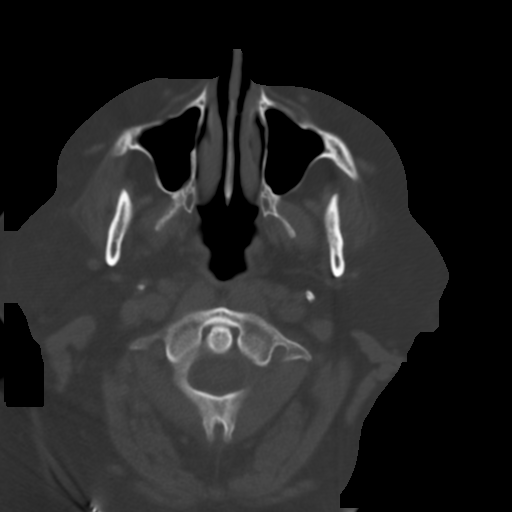
[im 4/30  brain]
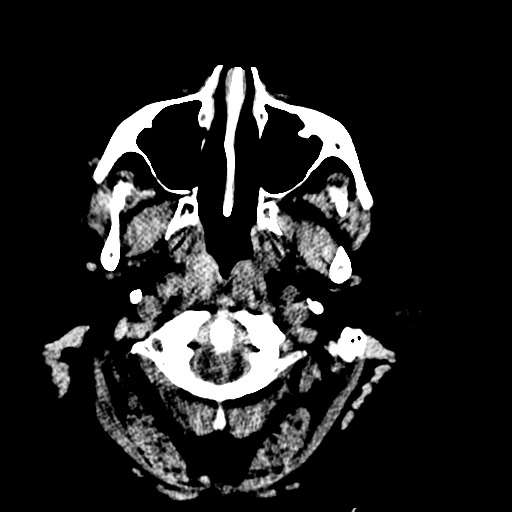
[im 5/30  brain]
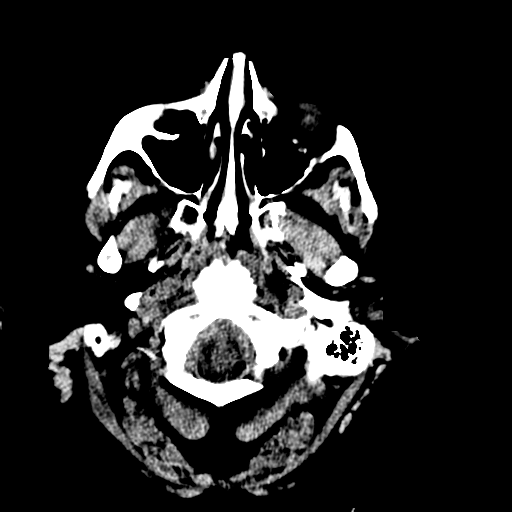
[im 8/30  brain]
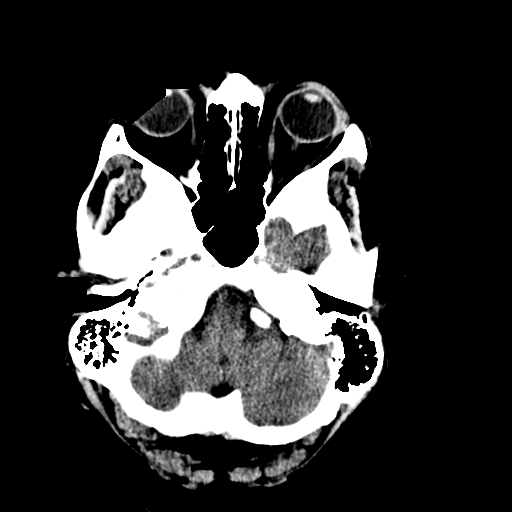
[im 9/30  brain]
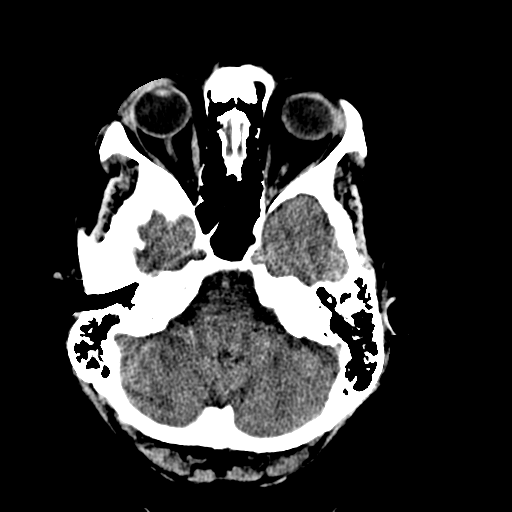
[im 9/30  bone]
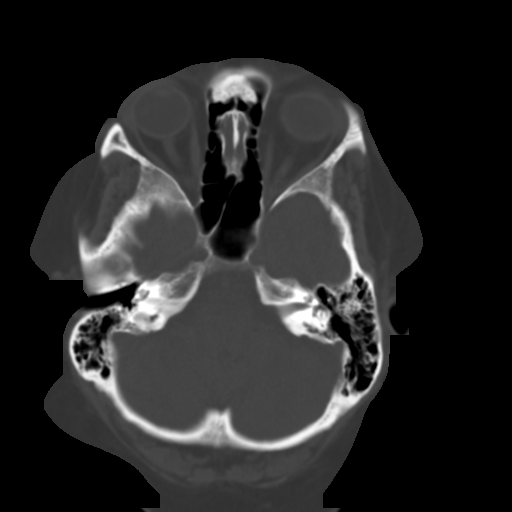
[im 10/30  brain]
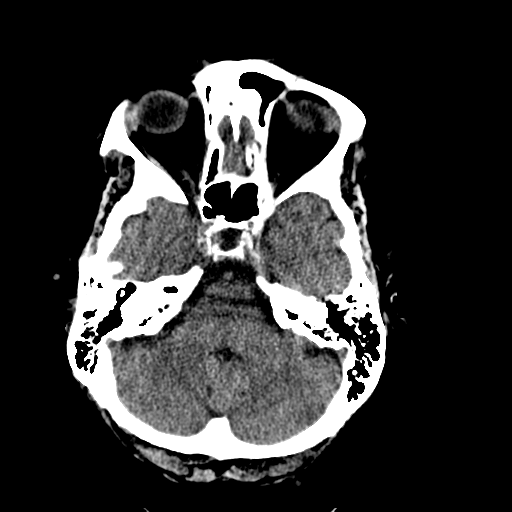
[im 13/30  brain]
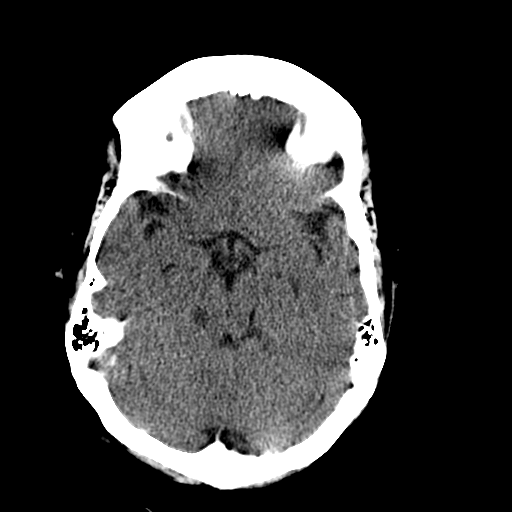
[im 14/30  brain]
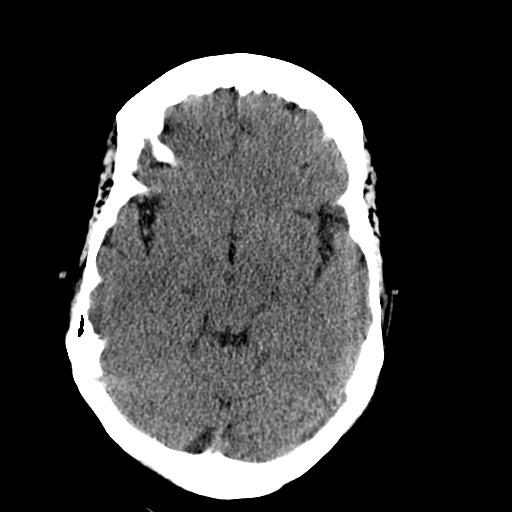
[im 16/30  brain]
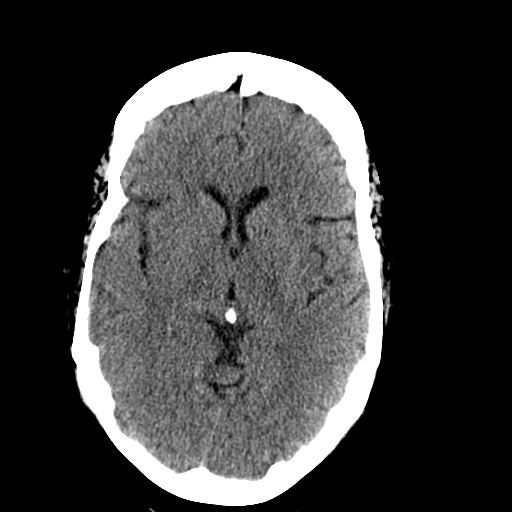
[im 16/30  bone]
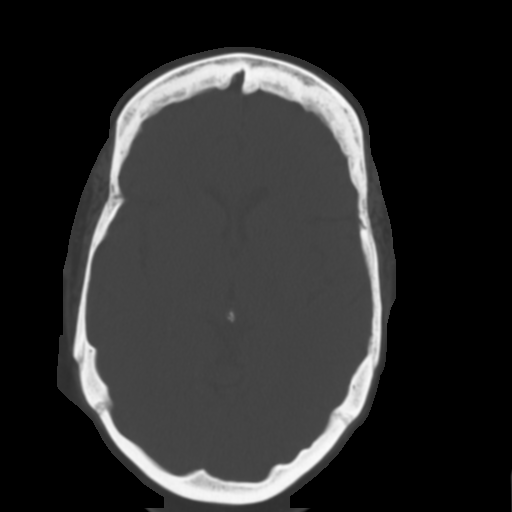
[im 17/30  brain]
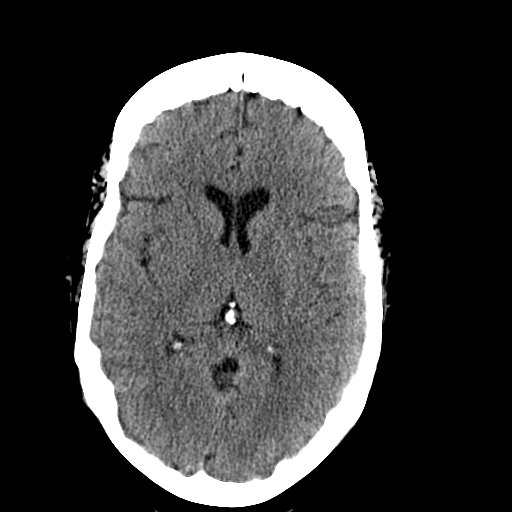
[im 20/30  brain]
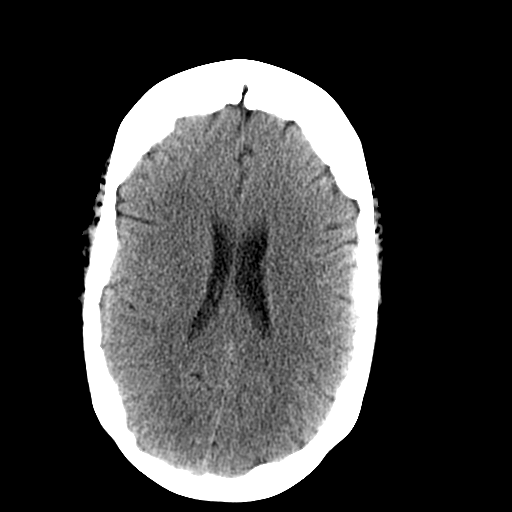
[im 21/30  brain]
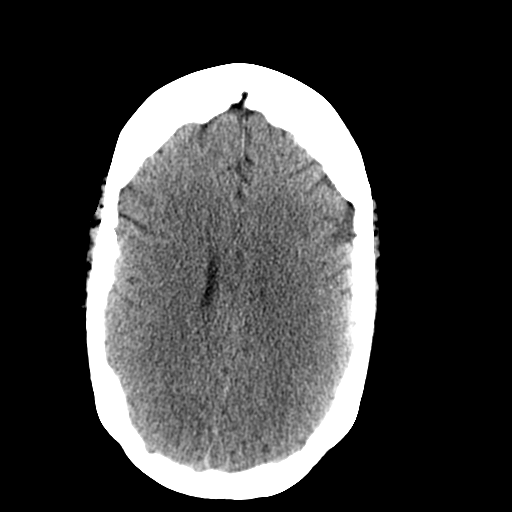
[im 22/30  brain]
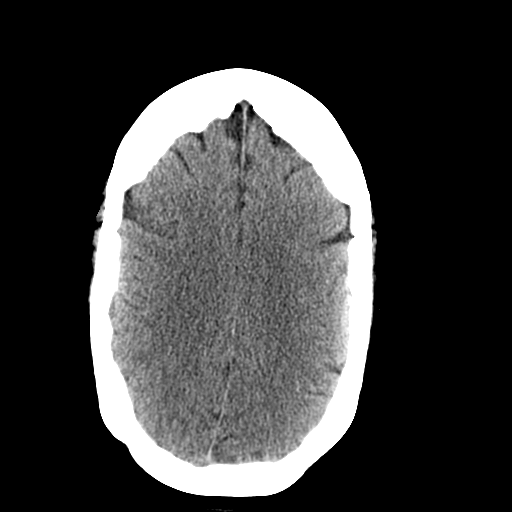
[im 22/30  bone]
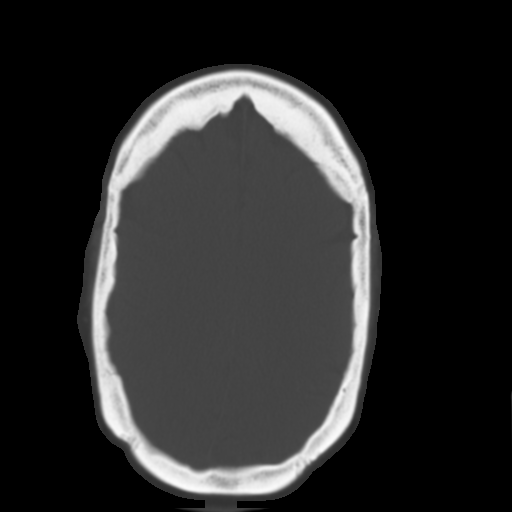
[im 25/30  brain]
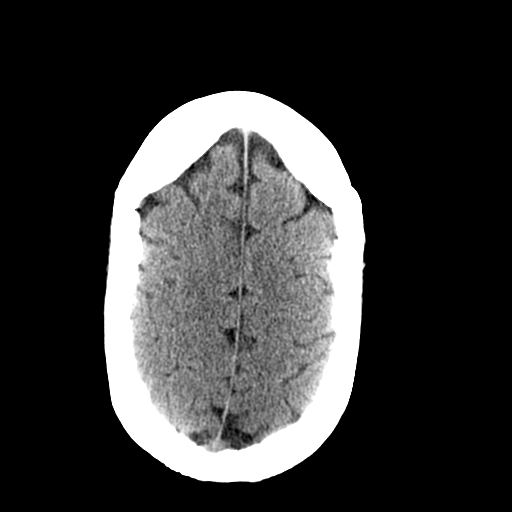
[im 26/30  brain]
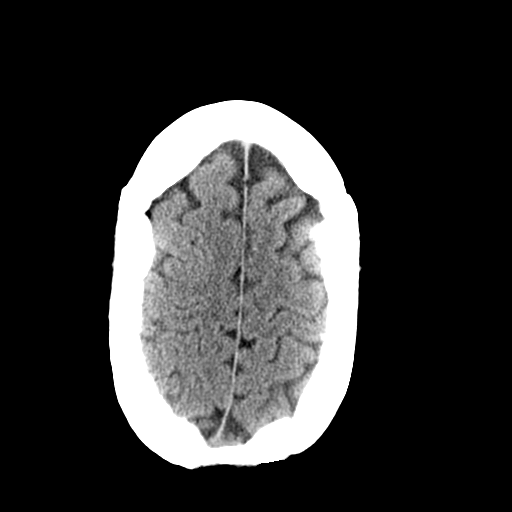
[im 28/30  brain]
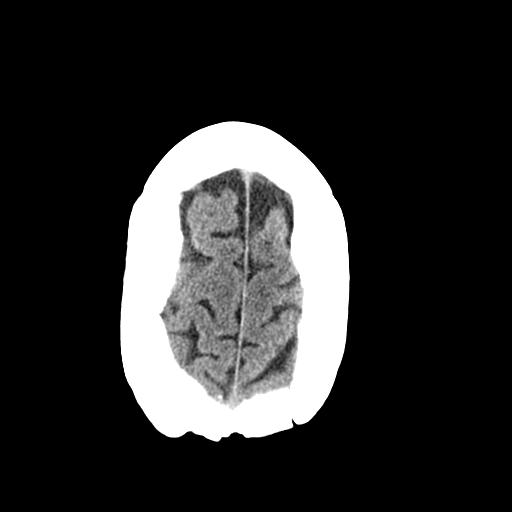

[16 of 30 positions shown; findings below may reference images not displayed]

FINDINGS: Ventricle size is normal.  Negative for intracranial
hemorrhage.  Negative for infarct or mass lesion.  No edema is
present in the brain.

Defect in the parietal bone over the convexity may be related to
prior surgery. There has been resorption of bone in this area since
the prior CT of [DATE].
IMPRESSION: No acute intracranial abnormality.

## 2011-03-01 MED ORDER — HYDROMORPHONE HCL 4 MG PO TABS
4.0000 mg | ORAL_TABLET | ORAL | Status: DC | PRN
  Administered 2011-03-01 – 2011-03-07 (×8): 4 mg via ORAL
  Filled 2011-03-01 (×9): qty 1

## 2011-03-01 MED ORDER — FUROSEMIDE 10 MG/ML IJ SOLN
40.0000 mg | Freq: Once | INTRAMUSCULAR | Status: AC
  Administered 2011-03-01: 40 mg via INTRAVENOUS
  Filled 2011-03-01: qty 4

## 2011-03-01 MED ORDER — FUROSEMIDE 10 MG/ML IJ SOLN
60.0000 mg | Freq: Once | INTRAMUSCULAR | Status: AC
  Administered 2011-03-01: 60 mg via INTRAVENOUS
  Filled 2011-03-01: qty 6

## 2011-03-01 NOTE — Progress Notes (Addendum)
Patient stated," I can not stand this bed it is hurting my back and very uncomfortable, Please may I have my old regular bed back for that bed was much more comfortable to me and it did not hurt my back, this mattress is to hard and I just can not stand it any longer," with tear in her eyes. Bed was change back to regular bed at 1930 on 02/28/2011

## 2011-03-01 NOTE — Progress Notes (Addendum)
Physical Therapy Treatment Patient Details Name: DAYLAH SAYAVONG MRN: [id number] DOB: January 26, 1971 Today's Date: 03/01/2011 11:02-11:23, ta  PT Assessment/Plan  PT - Assessment/Plan Comments on Treatment Session: Spoke with Dr. [name] prior to PT session re: previous PT's note re: need for [employer] consult for boot/orthotic to accomodate wound vac and limit WB on R heel.  He ordered consult.  Pt with Wb through lateral portion of foot.  Nursing reported pt not acting herself today. PT Plan: Discharge plan remains appropriate PT Frequency: Min 3X/week Follow Up Recommendations: Home health PT;24 hour supervision/assistance Equipment Recommended: Wheelchair (measurements);Wheelchair cushion (measurements) PT Goals  Acute Rehab PT Goals PT Goal: Supine/Side to Sit - Progress: Progressing toward goal PT Goal: Ambulate - Progress: Progressing toward goal (limited by wound vac, R foot drop, Wbing status) PT Goal: Up/Down Stairs - Progress: Other (comment) (not addressed) PT Goal: Propel Wheelchair - Progress: Other (comment) (not addressed)  PT Treatment Precautions/Restrictions  Precautions Precautions: Fall Precaution Comments: wound vac to R hip and heel with ankle splint to float heel Required Braces or Orthoses: Yes Other Brace/Splint: R ankle splint to heel Restrictions Weight Bearing Restrictions: No Other Position/Activity Restrictions: Utilized toe-touch/TDWB on forefoot so as to restrict weight through heel Mobility (including Balance) Bed Mobility Bed Mobility: Yes Supine to Sit: HOB elevated (Comment degrees);3: Mod assist Supine to Sit Details (indicate cue type and reason): Pt able to get LE off bed, but needed A for trunk Sitting - Scoot to Edge of Bed: 5: Supervision;With rail Transfers Sit to Stand: From bed;3: Mod assist;From elevated surface Sit to Stand Details (indicate cue type and reason): poor hand placement, cues for keeping weight off heel Stand to Sit: To  chair/3-in-1;With upper extremity assist;4: Min assist Stand Pivot Transfers: 4: Min assist (constant cues to keep weight off heel) Stand Pivot Transfer Details (indicate cue type and reason): Cues to keep weight off heel.  Pt Wb through lateral portion of foot.    Exercise    End of Session PT - End of Session Activity Tolerance: Patient limited by pain Patient left: in chair;with call bell in reach Nurse Communication: Mobility status for transfers General Behavior During Session: [facility] for tasks performed Cognition: [facility] for tasks performed  [facility] LUBECK 03/01/2011, 11:47 AM

## 2011-03-01 NOTE — Progress Notes (Signed)
ANTIBIOTIC CONSULT NOTE - Follow up  Pharmacy Consult for Vancomcyin and Cefepime Indication: Infected diabetic foot wound  Allergies  Allergen Reactions  . Sulfa Antibiotics Hives    Patient Measurements: Height: 5\' 5"  (165.1 cm) Weight: 451 lb (204.572 kg) IBW/kg (Calculated) : 57    Vital Signs: Temp: 97.5 F (36.4 C) (12/07 0700) Temp src: Oral (12/07 0700) BP: 107/61 mmHg (12/07 0700) Pulse Rate: 78  (12/07 0700)  Labs:  Basename 03/01/11 0520 02/28/11 0525 02/27/11 0517  WBC 10.1 11.3* 11.2*  HGB 11.2* 10.9* 10.4*  PLT 173 181 191  LABCREA -- -- --  CREATININE 0.85 0.76 0.71   Estimated Creatinine Clearance: 157.9 ml/min (by C-G formula based on Cr of 0.85).  Basename 02/26/11 1230  VANCOTROUGH 15.6  VANCOPEAK --  VANCORANDOM --  GENTTROUGH --  GENTPEAK --  GENTRANDOM --  TOBRATROUGH --  TOBRAPEAK --  TOBRARND --  AMIKACINPEAK --  AMIKACINTROU --  AMIKACIN --     Microbiology: Recent Results (from the past 720 hour(s))  MRSA PCR SCREENING     Status: Abnormal   Collection Time   02/20/11  1:41 PM      Component Value Range Status Comment   MRSA by PCR POSITIVE (*) NEGATIVE  Final    Medications:  Scheduled:     . benazepril  10 mg Oral Daily  . ceFEPime (MAXIPIME) IV  2 g Intravenous Q12H  . citalopram  60 mg Oral Daily  . enoxaparin  100 mg Subcutaneous QHS  . fentaNYL  400 mcg Transdermal Q72H  . fluticasone  2 spray Each Nare Daily  . furosemide  40 mg Intravenous Once  . gabapentin  300 mg Oral QHS  . insulin aspart  0-20 Units Subcutaneous TID WC  . insulin glargine  85 Units Subcutaneous QHS  . levothyroxine  112 mcg Oral Daily  . linagliptin  5 mg Oral Daily  . Liraglutide  1.8 mg Subcutaneous Daily  . loratadine  10 mg Oral Daily  . metoCLOPramide  10 mg Oral Q8H  . nystatin   Topical BID  . polyethylene glycol  17 g Oral Daily  . pregabalin  75 mg Oral TID  . pseudoephedrine  120 mg Oral BID  . simvastatin  20 mg Oral  Daily  . vancomycin  1,000 mg Intravenous Q12H   Assessment:  Day #11/15 (LOT = 14 days since last I&D) vanc 1g q12 and cefepime 2g q12 for diabetic heel ulcer.  No cultures. No evidence of osteo but vancomycin trough goal 15-20.    Also has chronic R hip wound with wound vac - no evidence of infection here per ID c/s.  12/4 vancomycin trough at goal, SCr stable, WBC now wnl, afebrile.   Plan:  Continue Vanc 1g IV q12h.  Continue Cefepime 2g IV q12h.  Clance Boll 03/01/2011,7:44 AM

## 2011-03-01 NOTE — Progress Notes (Signed)
03/01/11 PLAN TO D/C HOME W/HH,& BOTH WOUND VACS IN PLACE IF MD AGREE.?AMBULANCE TRANSP-WILL CONS SW.03/01/11 Nakota Ackert RN,BSN [phone].NCM-FAXED W/CONFIRMATION, & 2ND REQUEST W/CONF SENT TO [facility] ZOX#[id number]/UJ #[phone],SPOKE TO MICHELLE-[phone] ABOUT [facility] 2ND WOUND VAC FOR R HEEL,NEED AUTH.PATIENT FOR ORTH SX FOLLOWING TO [facility] ON MONDAY ?2ND SURGICAL DEBRIDEMENT.[facility]LL[facility] FOR HH-RN.WILL INFORM PATIENT TO BRING IN PRIOR HOME WOUND VAC/CANNISTER/CHARGER IF NOT ALREADY IN HOSPITAL

## 2011-03-01 NOTE — Progress Notes (Signed)
Subjective: Patient with some c/o heel pain. Per nursing patient aspirin questions slowly and seems somewhat lethargic. Patient states feels tired. Patient states was up all night coughing and wheezing however that has improved somewhat.   Objective: Vital signs in last 24 hours: Temp:  [97.5 F (36.4 C)-98.6 F (37 C)] 97.5 F (36.4 C) (12/07 0700) Pulse Rate:  [72-78] 78  (12/07 0700) Resp:  [18-19] 18  (12/07 0700) BP: (107-136)/(61-78) 107/61 mmHg (12/07 0700) SpO2:  [94 %-98 %] 98 % (12/07 0700) Weight change:  Last BM Date: 02/24/11  Intake/Output from previous day: 12/06 0701 - 12/07 0700 In: 660 [P.O.:360; IV Piggyback:300] Out: 4500 [Urine:4500]     Physical Exam: General: Alert, awake, oriented x3, in no acute distress.  Heart: Regular rate and rhythm, without murmurs, rubs, gallops. Distant heart sounds. Lungs: By basilar crackles. Decreased BS. Abdomen: Soft,OBESE, nontender, nondistended, positive bowel sounds.  Rt thigh VAC  Extremities: right foot dressing/VAC in place. Boot on. Neuro: moves extremities ,intact non focal exam.    Lab Results: Results for orders placed during the hospital encounter of 02/18/11 (from the past 24 hour(s))  GLUCOSE, CAPILLARY     Status: Abnormal   Collection Time   02/28/11  1:05 PM      Component Value Range   Glucose-Capillary 217 (*) 70 - 99 (mg/dL)  GLUCOSE, CAPILLARY     Status: Abnormal   Collection Time   02/28/11  4:57 PM      Component Value Range   Glucose-Capillary 193 (*) 70 - 99 (mg/dL)   Comment 1 Notify RN    GLUCOSE, CAPILLARY     Status: Abnormal   Collection Time   02/28/11  9:55 PM      Component Value Range   Glucose-Capillary 198 (*) 70 - 99 (mg/dL)  CBC     Status: Abnormal   Collection Time   03/01/11  5:20 AM      Component Value Range   WBC 10.1  4.0 - 10.5 (K/uL)   RBC 3.74 (*) 3.87 - 5.11 (MIL/uL)   Hemoglobin 11.2 (*) 12.0 - 15.0 (g/dL)   HCT 04.5  40.9 - 81.1 (%)   MCV 96.3  78.0 - 100.0  (fL)   MCH 29.9  26.0 - 34.0 (pg)   MCHC 31.1  30.0 - 36.0 (g/dL)   RDW 91.4  78.2 - 95.6 (%)   Platelets 173  150 - 400 (K/uL)  BASIC METABOLIC PANEL     Status: Abnormal   Collection Time   03/01/11  5:20 AM      Component Value Range   Sodium 137  135 - 145 (mEq/L)   Potassium 3.9  3.5 - 5.1 (mEq/L)   Chloride 95 (*) 96 - 112 (mEq/L)   CO2 38 (*) 19 - 32 (mEq/L)   Glucose, Bld 171 (*) 70 - 99 (mg/dL)   BUN 18  6 - 23 (mg/dL)   Creatinine, Ser 2.13  0.50 - 1.10 (mg/dL)   Calcium 08.6  8.4 - 10.5 (mg/dL)   GFR calc non Af Amer 83 (*) >90 (mL/min)   GFR calc Af Amer >90  >90 (mL/min)  GLUCOSE, CAPILLARY     Status: Abnormal   Collection Time   03/01/11  7:41 AM      Component Value Range   Glucose-Capillary 138 (*) 70 - 99 (mg/dL)  GLUCOSE, CAPILLARY     Status: Abnormal   Collection Time   03/01/11 10:16 AM  Component Value Range   Glucose-Capillary 141 (*) 70 - 99 (mg/dL)   Comment 1 Notify RN      Studies/Results: No results found.  Medications:    . benazepril  10 mg Oral Daily  . ceFEPime (MAXIPIME) IV  2 g Intravenous Q12H  . citalopram  60 mg Oral Daily  . enoxaparin  100 mg Subcutaneous QHS  . fentaNYL  400 mcg Transdermal Q72H  . fluticasone  2 spray Each Nare Daily  . furosemide  40 mg Intravenous Once  . gabapentin  300 mg Oral QHS  . insulin aspart  0-20 Units Subcutaneous TID WC  . insulin glargine  85 Units Subcutaneous QHS  . levothyroxine  112 mcg Oral Daily  . linagliptin  5 mg Oral Daily  . Liraglutide  1.8 mg Subcutaneous Daily  . loratadine  10 mg Oral Daily  . metoCLOPramide  10 mg Oral Q8H  . nystatin   Topical BID  . polyethylene glycol  17 g Oral Daily  . pregabalin  75 mg Oral TID  . pseudoephedrine  120 mg Oral BID  . simvastatin  20 mg Oral Daily  . vancomycin  1,000 mg Intravenous Q12H    acetaminophen, acetaminophen, albuterol, carisoprodol, diazepam, HYDROmorphone, oxyCODONE, oxyCODONE-acetaminophen, senna-docusate, DISCONTD:  HYDROmorphone     Assessment/Plan:  1. Lethargy-  Unknown etiology. Maybe secondary to narcotic medications vs fatigue. Will decrease dilaudid to 4mg  PRN, check CXR, check CT head. Follow. 2.. Diabetic heel ulcer  S/p I&D and right foot VAC  CT-scan of the heel showed no evidence of osteomyelitis.  Cont IV Vancomycin and Ceftazidime D#11,to complete 2 weeks from last I&D.weekly cbc, bmp and vancomycin level as per  Dr Zenaida Niece Dam's recommendations .  Continue Wound care ,may need another debridement as per ortho.  Ortho to reasses on Monday. Continue Pain control  2.Diabetes mellitus type 2 with complications, : fair,continue lantus, SSI, hold metformin .  3.Cough:  CXR negative for PNA. Symptomatic treatment. Repeat CXR. Give IV lasix follow. 4. R hip wound with wound vac : follow up with wound care ,plans to change vac MWF  5. Chronic pain syndrome: continue home dose of narcotics, i.e Fentanyl patch, dilaudid ,percocet  6.Constipation  Continue senokot s and miralax prn.  7.Disposition:  Pending clearance by surgical service. PT recommended SNF ,patient is refusing  Will D/C with  HHPT/wheelchair, when stable for D/C.     LOS: 11 days   [place] Mckeough 03/01/2011, 11:11 AM

## 2011-03-01 NOTE — Progress Notes (Signed)
RECEIVED CALL FROM ANN([facility] REP)JUST INFORMED ME OF PRESCRIBER INFO NOW NEED COMPLETION(PRIOR SINCE PATIENT ALREADY HAVE R HIP WOUND VAC THIS INFO WAS NOT NEEDED BUT NOW IT IS)TC TO DR. GREGORY SCOTT DEAN OFFICE SPOKE TO SECY TABITHA WHO PROVIDED DR'S PAGER [phone].

## 2011-03-01 NOTE — Progress Notes (Signed)
Discussed in the long length of stay meeting [name] Weeks 03/01/2011  

## 2011-03-02 DIAGNOSIS — K5900 Constipation, unspecified: Secondary | ICD-10-CM | POA: Clinically undetermined

## 2011-03-02 LAB — COMPREHENSIVE METABOLIC PANEL
ALT: 42 U/L — ABNORMAL HIGH (ref 0–35)
AST: 63 U/L — ABNORMAL HIGH (ref 0–37)
Albumin: 2.8 g/dL — ABNORMAL LOW (ref 3.5–5.2)
Alkaline Phosphatase: 101 U/L (ref 39–117)
BUN: 23 mg/dL (ref 6–23)
CO2: 36 mEq/L — ABNORMAL HIGH (ref 19–32)
Calcium: 9.9 mg/dL (ref 8.4–10.5)
Chloride: 91 mEq/L — ABNORMAL LOW (ref 96–112)
Creatinine, Ser: 0.83 mg/dL (ref 0.50–1.10)
GFR calc Af Amer: >90 mL/min (ref >90)
GFR calc non Af Amer: 86 mL/min — ABNORMAL LOW (ref >90)
Glucose, Bld: 137 mg/dL — ABNORMAL HIGH (ref 70–99)
Potassium: 3.7 mEq/L (ref 3.5–5.1)
Sodium: 134 mEq/L — ABNORMAL LOW (ref 135–145)
Total Bilirubin: 0.2 mg/dL — ABNORMAL LOW (ref 0.3–1.2)
Total Protein: 6.7 g/dL (ref 6.0–8.3)

## 2011-03-02 LAB — URINALYSIS, ROUTINE W REFLEX MICROSCOPIC
Bilirubin Urine: NEGATIVE
Glucose, UA: NEGATIVE mg/dL
Ketones, ur: NEGATIVE mg/dL
Leukocytes, UA: NEGATIVE
Nitrite: NEGATIVE
Protein, ur: 30 mg/dL — AB
Specific Gravity, Urine: 1.021 (ref 1.005–1.030)
Urobilinogen, UA: 0.2 mg/dL (ref 0.0–1.0)
pH: 6 (ref 5.0–8.0)

## 2011-03-02 LAB — URINE MICROSCOPIC-ADD ON

## 2011-03-02 LAB — CBC
HCT: 33.8 % — ABNORMAL LOW (ref 36.0–46.0)
Hemoglobin: 10.7 g/dL — ABNORMAL LOW (ref 12.0–15.0)
MCH: 29.8 pg (ref 26.0–34.0)
MCHC: 31.7 g/dL (ref 30.0–36.0)
MCV: 94.2 fL (ref 78.0–100.0)
Platelets: 192 10*3/uL (ref 150–400)
RBC: 3.59 MIL/uL — ABNORMAL LOW (ref 3.87–5.11)
RDW: 12.8 % (ref 11.5–15.5)
WBC: 17 10*3/uL — ABNORMAL HIGH (ref 4.0–10.5)

## 2011-03-02 LAB — GLUCOSE, CAPILLARY
Glucose-Capillary: 115 mg/dL — ABNORMAL HIGH (ref 70–99)
Glucose-Capillary: 210 mg/dL — ABNORMAL HIGH (ref 70–99)
Glucose-Capillary: 163 mg/dL — ABNORMAL HIGH (ref 70–99)

## 2011-03-02 LAB — PREALBUMIN: Prealbumin: 18 mg/dL (ref 17.0–34.0)

## 2011-03-02 LAB — HEMOGLOBIN A1C
Hgb A1c MFr Bld: 6.8 % — ABNORMAL HIGH (ref <5.7)
Mean Plasma Glucose: 148 mg/dL — ABNORMAL HIGH (ref <117)

## 2011-03-02 NOTE — Progress Notes (Signed)
Subjective: Patient less lethargic, c/o constipation and knot around belly button.  Objective: Vital signs in last 24 hours: Temp:  [98.1 F (36.7 C)-98.7 F (37.1 C)] 98.3 F (36.8 C) (12/08 0700) Pulse Rate:  [82-92] 82  (12/08 0700) Resp:  [19-20] 20  (12/08 0700) BP: (96-138)/(61-77) 96/64 mmHg (12/08 0700) SpO2:  [96 %-98 %] 96 % (12/08 0700) Weight change:  Last BM Date: 03/01/11  Intake/Output from previous day: 12/07 0701 - 12/08 0700 In: 490 [P.O.:240; IV Piggyback:250] Out: 3550 [Urine:3550] Total I/O In: -  Out: 1450 [Urine:1450]   Physical Exam: General: Alert, awake, oriented x3, in no acute distress.  Heart: Regular rate and rhythm, without murmurs, rubs, gallops. Distant heart sounds. Lungs: CTA B anterior lung fields. Abdomen: Soft,OBESE, nontender, nondistended, positive bowel sounds.  Rt thigh VAC  Extremities: right foot dressing/VAC in place. Boot on. Neuro: moves extremities ,intact non focal exam.    Lab Results: Results for orders placed during the hospital encounter of 02/18/11 (from the past 24 hour(s))  GLUCOSE, CAPILLARY     Status: Abnormal   Collection Time   03/01/11  3:51 PM      Component Value Range   Glucose-Capillary 194 (*) 70 - 99 (mg/dL)   Comment 1 STAT Lab     Comment 2 Notify RN    GLUCOSE, CAPILLARY     Status: Abnormal   Collection Time   03/01/11  6:41 PM      Component Value Range   Glucose-Capillary 268 (*) 70 - 99 (mg/dL)   Comment 1 Notify RN    GLUCOSE, CAPILLARY     Status: Abnormal   Collection Time   03/01/11  9:47 PM      Component Value Range   Glucose-Capillary 204 (*) 70 - 99 (mg/dL)   Comment 1 Notify RN     Comment 2 Documented in Chart    CBC     Status: Abnormal   Collection Time   03/02/11  5:30 AM      Component Value Range   WBC 17.0 (*) 4.0 - 10.5 (K/uL)   RBC 3.59 (*) 3.87 - 5.11 (MIL/uL)   Hemoglobin 10.7 (*) 12.0 - 15.0 (g/dL)   HCT 21.3 (*) 08.6 - 46.0 (%)   MCV 94.2  78.0 - 100.0 (fL)   MCH 29.8  26.0 - 34.0 (pg)   MCHC 31.7  30.0 - 36.0 (g/dL)   RDW 57.8  46.9 - 62.9 (%)   Platelets 192  150 - 400 (K/uL)  COMPREHENSIVE METABOLIC PANEL     Status: Abnormal   Collection Time   03/02/11  5:30 AM      Component Value Range   Sodium 134 (*) 135 - 145 (mEq/L)   Potassium 3.7  3.5 - 5.1 (mEq/L)   Chloride 91 (*) 96 - 112 (mEq/L)   CO2 36 (*) 19 - 32 (mEq/L)   Glucose, Bld 137 (*) 70 - 99 (mg/dL)   BUN 23  6 - 23 (mg/dL)   Creatinine, Ser 5.28  0.50 - 1.10 (mg/dL)   Calcium 9.9  8.4 - 41.3 (mg/dL)   Total Protein 6.7  6.0 - 8.3 (g/dL)   Albumin 2.8 (*) 3.5 - 5.2 (g/dL)   AST 63 (*) 0 - 37 (U/L)   ALT 42 (*) 0 - 35 (U/L)   Alkaline Phosphatase 101  39 - 117 (U/L)   Total Bilirubin 0.2 (*) 0.3 - 1.2 (mg/dL)   GFR calc non Af Amer 86 (*) >  90 (mL/min)   GFR calc Af Amer >90  >90 (mL/min)  PREALBUMIN     Status: Normal   Collection Time   03/02/11  5:30 AM      Component Value Range   Prealbumin 18.0  17.0 - 34.0 (mg/dL)  HEMOGLOBIN G6Y     Status: Abnormal   Collection Time   03/02/11  5:30 AM      Component Value Range   Hemoglobin A1C 6.8 (*) <5.7 (%)   Mean Plasma Glucose 148 (*) <117 (mg/dL)  GLUCOSE, CAPILLARY     Status: Abnormal   Collection Time   03/02/11  8:04 AM      Component Value Range   Glucose-Capillary 115 (*) 70 - 99 (mg/dL)   Comment 1 Notify RN    URINALYSIS, ROUTINE W REFLEX MICROSCOPIC     Status: Abnormal   Collection Time   03/02/11  8:42 AM      Component Value Range   Color, Urine YELLOW  YELLOW    APPearance CLOUDY (*) CLEAR    Specific Gravity, Urine 1.021  1.005 - 1.030    pH 6.0  5.0 - 8.0    Glucose, UA NEGATIVE  NEGATIVE (mg/dL)   Hgb urine dipstick LARGE (*) NEGATIVE    Bilirubin Urine NEGATIVE  NEGATIVE    Ketones, ur NEGATIVE  NEGATIVE (mg/dL)   Protein, ur 30 (*) NEGATIVE (mg/dL)   Urobilinogen, UA 0.2  0.0 - 1.0 (mg/dL)   Nitrite NEGATIVE  NEGATIVE    Leukocytes, UA NEGATIVE  NEGATIVE   URINE MICROSCOPIC-ADD ON      Status: Abnormal   Collection Time   03/02/11  8:42 AM      Component Value Range   WBC, UA 0-2  <3 (WBC/hpf)   RBC / HPF 11-20  <3 (RBC/hpf)   Bacteria, UA FEW (*) RARE    Urine-Other MANY YEAST    GLUCOSE, CAPILLARY     Status: Abnormal   Collection Time   03/02/11 11:31 AM      Component Value Range   Glucose-Capillary 210 (*) 70 - 99 (mg/dL)   Comment 1 Notify RN      Studies/Results: Ct Head Wo Contrast  03/01/2011  *RADIOLOGY REPORT*  Clinical Data: Altered mental status.  CT HEAD WITHOUT CONTRAST  Technique:  Contiguous axial images were obtained from the base of the skull through the vertex without contrast.  Comparison: CT 04/21/2008  Findings: Ventricle size is normal.  Negative for intracranial hemorrhage.  Negative for infarct or mass lesion.  No edema is present in the brain.  Defect in the parietal bone over the convexity may be related to prior surgery. There has been resorption of bone in this area since the prior CT of 04/21/2008.  IMPRESSION: No acute intracranial abnormality.  Original Report Authenticated By: Camelia Phenes, M.D.   Dg Chest Port 1 View  03/01/2011  *RADIOLOGY REPORT*  Clinical Data: Shortness of breath  PORTABLE CHEST - 1 VIEW  Comparison: Portable exam 1037 hours compared to 02/25/2011  Findings: Enlargement of cardiac silhouette. Slight pulmonary vascular congestion. Mediastinal contours normal. Minimal right basilar atelectasis. Lungs otherwise clear. No pleural effusion or pneumothorax.  IMPRESSION: Enlargement of cardiac silhouette with minimal right basilar atelectasis.  Original Report Authenticated By: Lollie Marrow, M.D.    Medications:    . benazepril  10 mg Oral Daily  . ceFEPime (MAXIPIME) IV  2 g Intravenous Q12H  . citalopram  60 mg Oral Daily  .  enoxaparin  100 mg Subcutaneous QHS  . fentaNYL  400 mcg Transdermal Q72H  . fluticasone  2 spray Each Nare Daily  . gabapentin  300 mg Oral QHS  . insulin aspart  0-20 Units Subcutaneous TID  WC  . insulin glargine  85 Units Subcutaneous QHS  . levothyroxine  112 mcg Oral Daily  . linagliptin  5 mg Oral Daily  . Liraglutide  1.8 mg Subcutaneous Daily  . loratadine  10 mg Oral Daily  . metoCLOPramide  10 mg Oral Q8H  . nystatin   Topical BID  . polyethylene glycol  17 g Oral Daily  . pregabalin  75 mg Oral TID  . pseudoephedrine  120 mg Oral BID  . simvastatin  20 mg Oral Daily  . vancomycin  1,000 mg Intravenous Q12H    acetaminophen, acetaminophen, albuterol, carisoprodol, diazepam, HYDROmorphone, oxyCODONE, oxyCODONE-acetaminophen, senna-docusate     Assessment/Plan:  1. Lethargy-  Unknown etiology. Maybe secondary to narcotic medications vs fatigue. CT head and CXR negative. Improved with decreased narcotics. Follow 2.. Diabetic heel ulcer  S/p I&D and right foot VAC  CT-scan of the heel showed no evidence of osteomyelitis.  Cont IV Vancomycin and Ceftazidime D#12,to complete 2 weeks from last I&D.weekly cbc, bmp and vancomycin level as per  Dr Zenaida Niece Dam's recommendations .  Continue Wound care ,may need another debridement as per ortho.  Ortho to reasses on Monday. Continue Pain control  2.Diabetes mellitus type 2 with complications, : fair,continue lantus, SSI, hold metformin .  3.Cough:  CXR negative for PNA. Symptomatic treatment. Repeat CXR. Give IV lasix follow. 4. R hip wound with wound vac : follow up with wound care ,plans to change vac MWF  5. Chronic pain syndrome: continue home dose of narcotics, i.e Fentanyl patch, dilaudid ,percocet  6.Constipation  Continue senokot s and miralax prn.  7. Belly button knot - abdominal xray r/o hernia. 8.Disposition:  Pending clearance by surgical service. PT recommended SNF ,patient is refusing  Will D/C with  HHPT/wheelchair, when stable for D/C.     LOS: 12 days   THOMPSON,DANIEL 03/02/2011, 2:48 PM

## 2011-03-03 ENCOUNTER — Inpatient Hospital Stay (HOSPITAL_COMMUNITY): Payer: Medicare Other

## 2011-03-03 LAB — URINE CULTURE
Colony Count: 85000
Culture  Setup Time: 201212081319

## 2011-03-03 LAB — CBC
HCT: 34.3 % — ABNORMAL LOW (ref 36.0–46.0)
Hemoglobin: 10.7 g/dL — ABNORMAL LOW (ref 12.0–15.0)
MCH: 29.6 pg (ref 26.0–34.0)
MCHC: 31.2 g/dL (ref 30.0–36.0)
MCV: 95 fL (ref 78.0–100.0)
Platelets: 190 10*3/uL (ref 150–400)
RBC: 3.61 MIL/uL — ABNORMAL LOW (ref 3.87–5.11)
RDW: 12.9 % (ref 11.5–15.5)
WBC: 11.1 10*3/uL — ABNORMAL HIGH (ref 4.0–10.5)

## 2011-03-03 LAB — BLOOD GAS, ARTERIAL
Acid-Base Excess: 8.4 mmol/L — ABNORMAL HIGH (ref 0.0–2.0)
Bicarbonate: 34.9 mEq/L — ABNORMAL HIGH (ref 20.0–24.0)
Drawn by: 310571
O2 Content: 3 L/min
O2 Saturation: 96 %
Patient temperature: 98.6
TCO2: 32.2 mmol/L (ref 0–100)
pCO2 arterial: 61.1 mmHg (ref 35.0–45.0)
pH, Arterial: 7.375 (ref 7.350–7.400)
pO2, Arterial: 76.9 mmHg — ABNORMAL LOW (ref 80.0–100.0)

## 2011-03-03 LAB — DIFFERENTIAL
Basophils Absolute: 0.1 10*3/uL (ref 0.0–0.1)
Basophils Relative: 1 % (ref 0–1)
Eosinophils Absolute: 0.7 10*3/uL (ref 0.0–0.7)
Eosinophils Relative: 6 % — ABNORMAL HIGH (ref 0–5)
Lymphocytes Relative: 34 % (ref 12–46)
Lymphs Abs: 3.8 10*3/uL (ref 0.7–4.0)
Monocytes Absolute: 1.3 10*3/uL — ABNORMAL HIGH (ref 0.1–1.0)
Monocytes Relative: 12 % (ref 3–12)
Neutro Abs: 5.2 10*3/uL (ref 1.7–7.7)
Neutrophils Relative %: 47 % (ref 43–77)

## 2011-03-03 LAB — BASIC METABOLIC PANEL
BUN: 26 mg/dL — ABNORMAL HIGH (ref 6–23)
CO2: 40 mEq/L (ref 19–32)
Calcium: 10.1 mg/dL (ref 8.4–10.5)
Chloride: 94 mEq/L — ABNORMAL LOW (ref 96–112)
Creatinine, Ser: 0.94 mg/dL (ref 0.50–1.10)
GFR calc Af Amer: 86 mL/min — ABNORMAL LOW (ref >90)
GFR calc non Af Amer: 74 mL/min — ABNORMAL LOW (ref >90)
Glucose, Bld: 164 mg/dL — ABNORMAL HIGH (ref 70–99)
Potassium: 4 mEq/L (ref 3.5–5.1)
Sodium: 138 mEq/L (ref 135–145)

## 2011-03-03 LAB — GLUCOSE, CAPILLARY
Glucose-Capillary: 271 mg/dL — ABNORMAL HIGH (ref 70–99)
Glucose-Capillary: 169 mg/dL — ABNORMAL HIGH (ref 70–99)
Glucose-Capillary: 225 mg/dL — ABNORMAL HIGH (ref 70–99)
Glucose-Capillary: 335 mg/dL — ABNORMAL HIGH (ref 70–99)
Glucose-Capillary: 165 mg/dL — ABNORMAL HIGH (ref 70–99)
Glucose-Capillary: 205 mg/dL — ABNORMAL HIGH (ref 70–99)

## 2011-03-03 LAB — PREALBUMIN: Prealbumin: 18 mg/dL (ref 17.0–34.0)

## 2011-03-03 IMAGING — CT CT HIP*R* W/O CM
2 series · 13 of 32 positions shown, 19 images · non-contrast
Comparison: CT pelvis [DATE].

CLINICAL DATA: Possible right hip infection.  Ulcer over the right
hip.

CT OF THE RIGHT HIP WITHOUT CONTRAST
TECHNIQUE: Multidetector CT imaging was performed according to the
standard protocol. Multiplanar CT image reconstructions were also
generated.

[Series 3: bone windows · axial · 0.74mm/px · z∈[-178,-138]mm · 3 of 63 slices shown]
[im 7/63  bone]
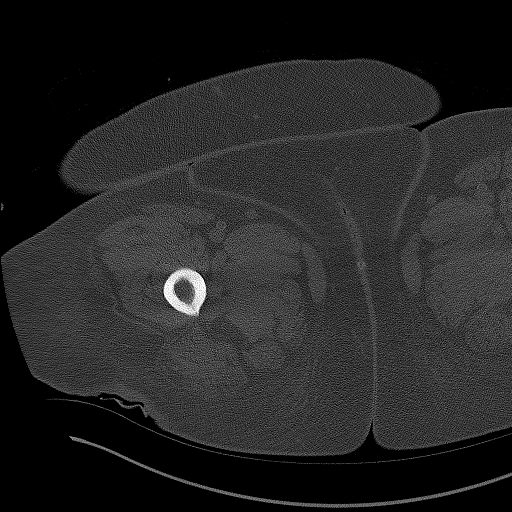
[im 14/63  bone]
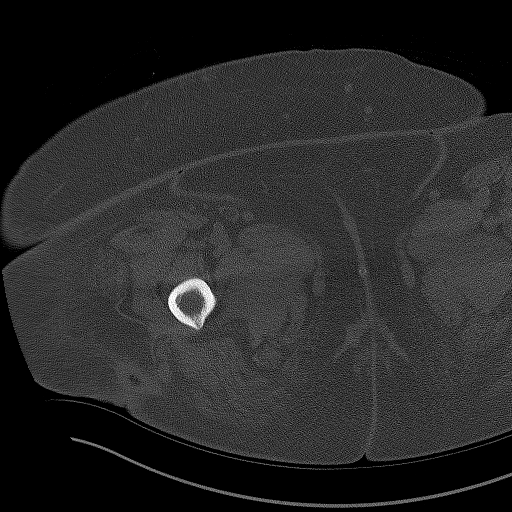
[im 20/63  bone]
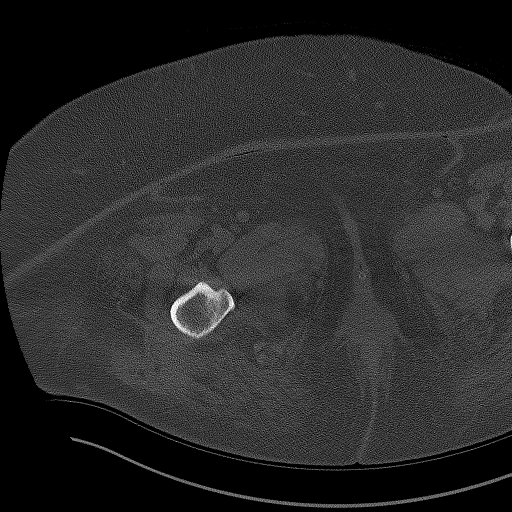

[Series 4: hip st · axial · 0.74mm/px · z∈[-180,-30]mm · 10 of 38 slices shown, 16 images]
[im 4/38  soft-tissue]
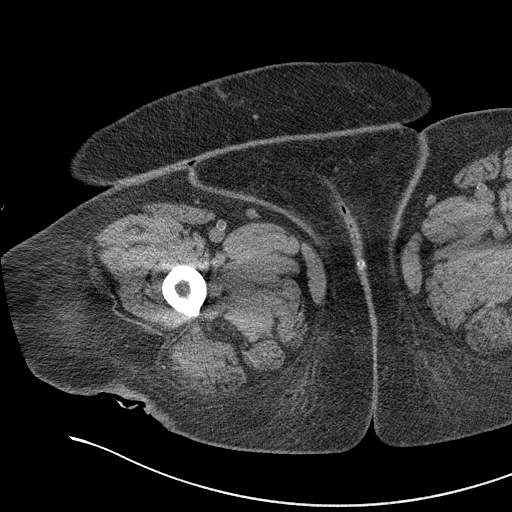
[im 4/38  bone]
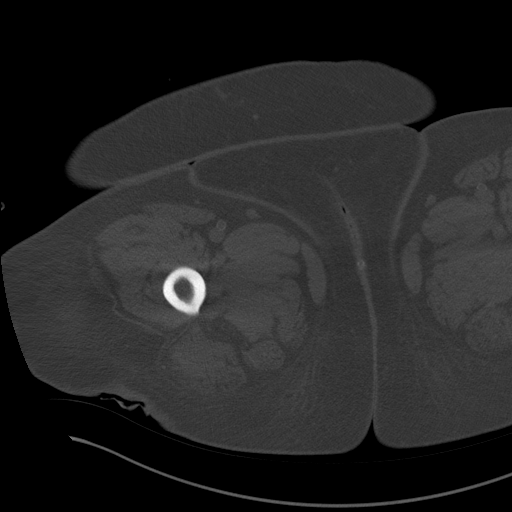
[im 7/38  soft-tissue]
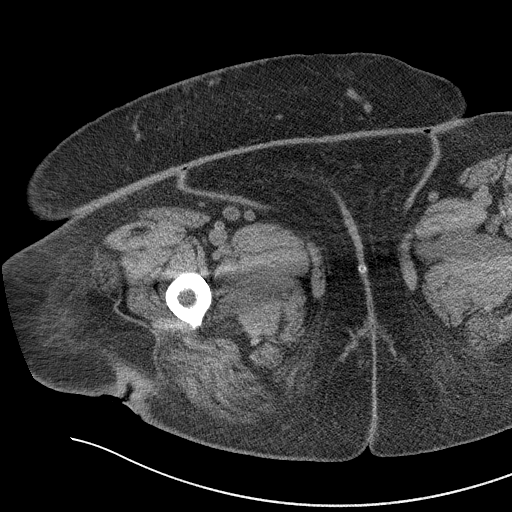
[im 11/38  soft-tissue]
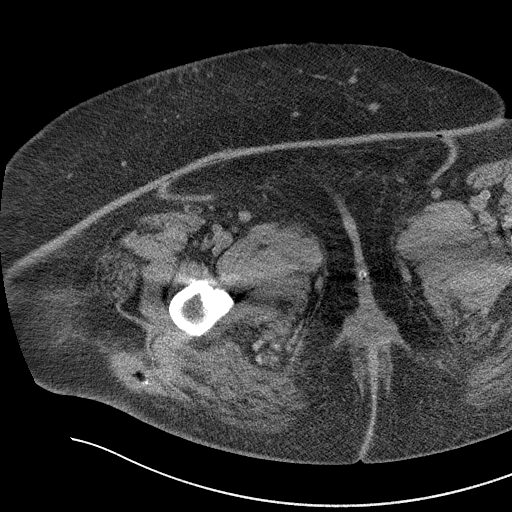
[im 14/38  soft-tissue]
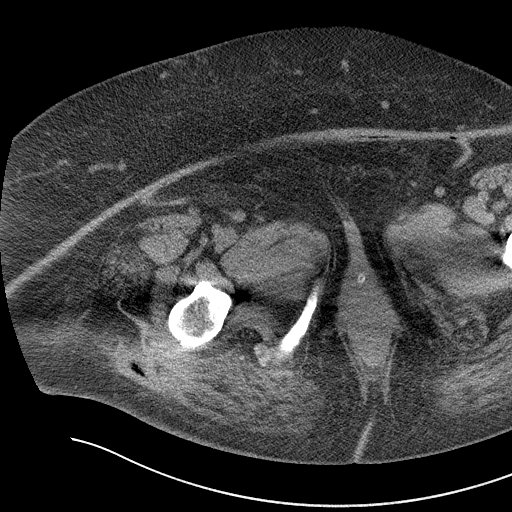
[im 17/38  soft-tissue]
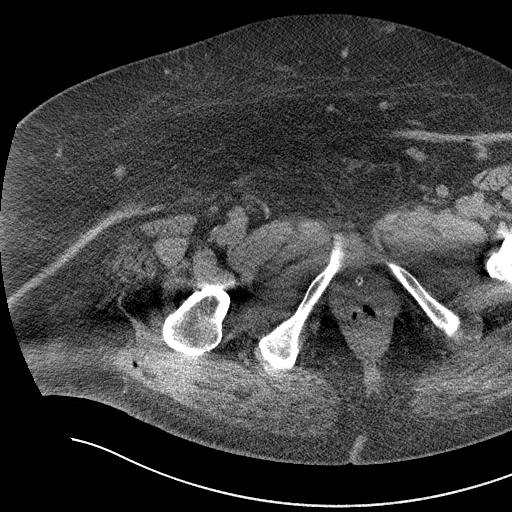
[im 21/38  soft-tissue]
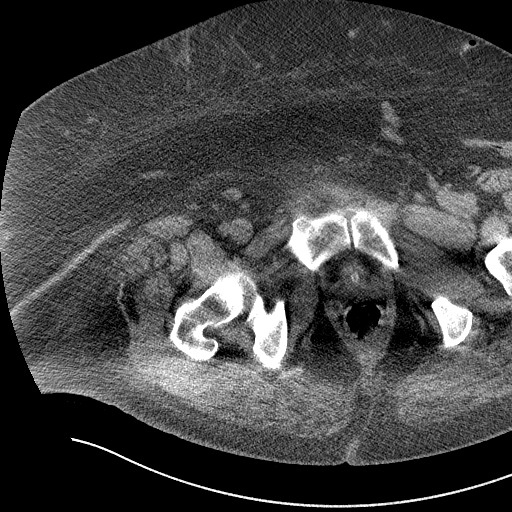
[im 24/38  soft-tissue]
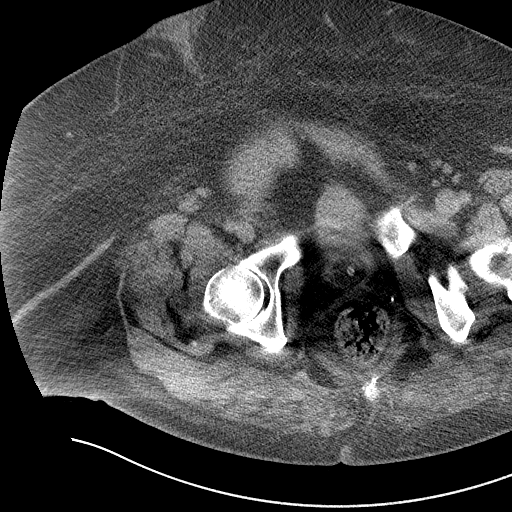
[im 24/38  lung]
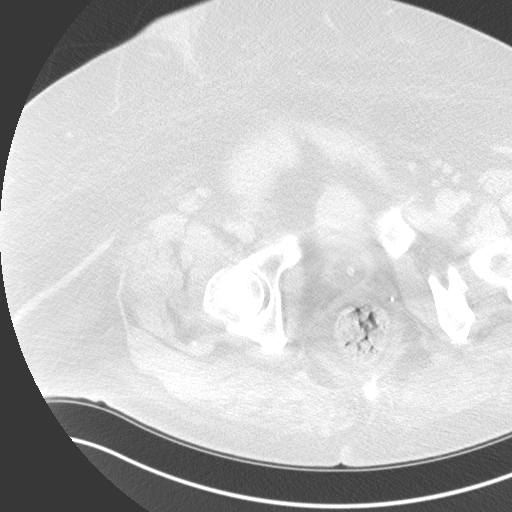
[im 27/38  soft-tissue]
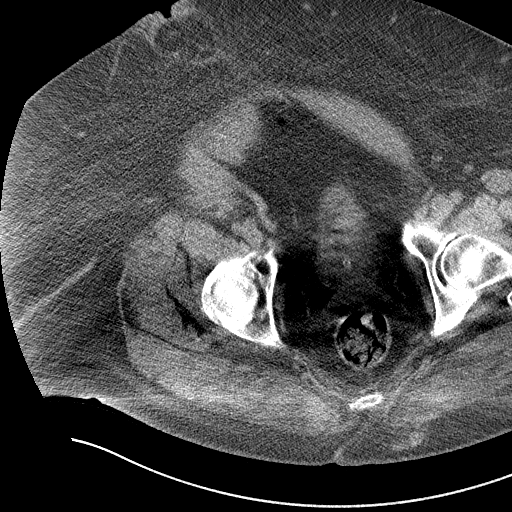
[im 27/38  lung]
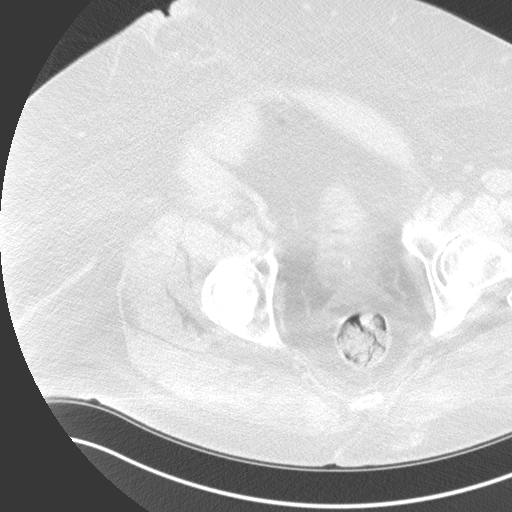
[im 31/38  soft-tissue]
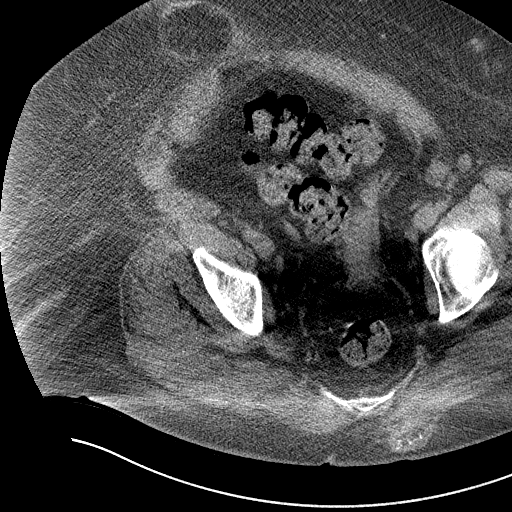
[im 31/38  lung]
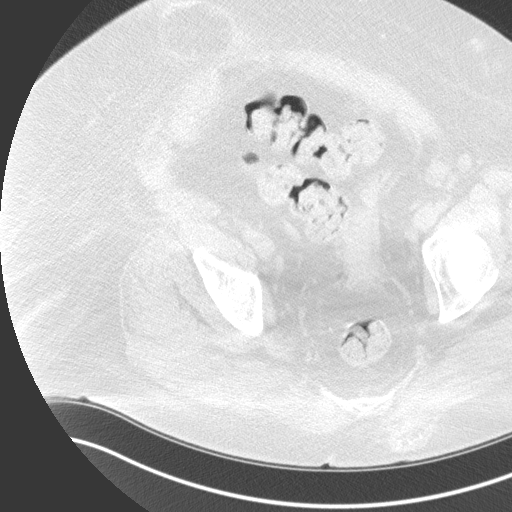
[im 31/38  bone]
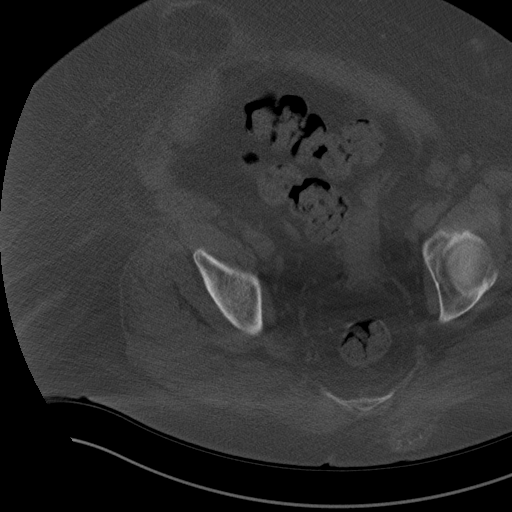
[im 34/38  soft-tissue]
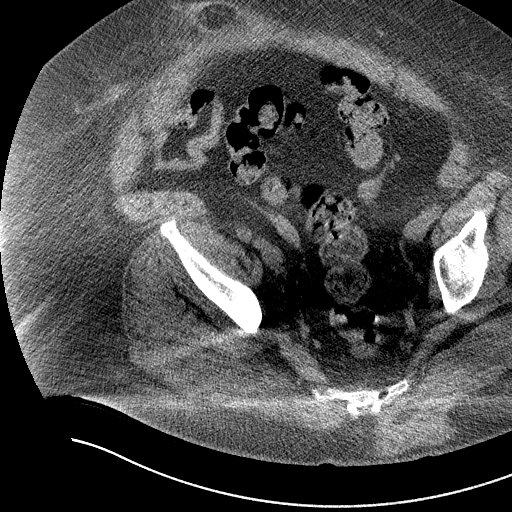
[im 34/38  lung]
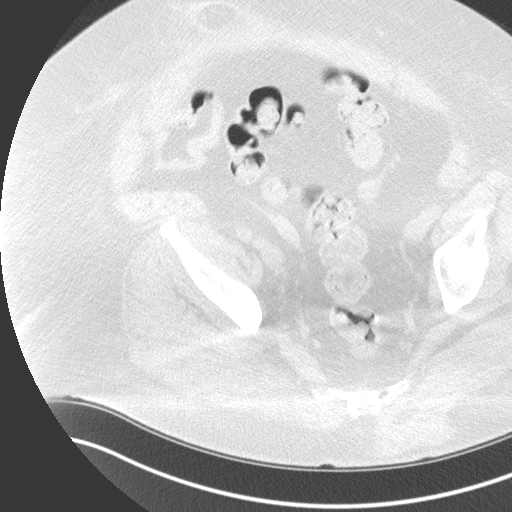

[13 of 32 positions shown; findings below may reference images not displayed]

FINDINGS: This study is limited by the patient's body habitus.  CT
scan is not sensitive for the detection of osteomyelitis.  Given
these limitations, no bony destructive change to suggest
osteomyelitis is identified.  There is a soft tissue wound over the
right greater trochanter extending into the deep subcutaneous
tissues along the gluteus maximus.  No focal fluid collection is
identified.  No right hip joint effusion is present.

The patient has a fat containing umbilical hernia. Also seen is a
fatty lesion over the left aspect of the sacrum with some rim
calcification.  Lesion measures approximately 5.2 cm transverse by
3.6 cm AP.  Imaged intrapelvic contents demonstrate a Foley
catheter in place.
IMPRESSION: 1.  Soft tissue wound over the right hip.  No underlying evidence
of abscess or osteomyelitis.
2.  Fat containing ventral hernia.
3.  There is calcification in subcutaneous fat over the sacrum may
be due to old trauma or fat necrosis.

## 2011-03-03 NOTE — Progress Notes (Signed)
Subjective: Patient states heel pain is controlled. No complaints. Tolerating meals. BM, flatus.  Objective: Vital signs in last 24 hours: Temp:  [97.8 F (36.6 C)-98 F (36.7 C)] 97.8 F (36.6 C) (12/09 0600) Pulse Rate:  [77-82] 77  (12/09 0600) Resp:  [18] 18  (12/09 0600) BP: (120-134)/(63-79) 120/63 mmHg (12/09 0600) SpO2:  [92 %-99 %] 99 % (12/09 1424) Weight change:  Last BM Date: 03/02/11  Intake/Output from previous day: 12/08 0701 - 12/09 0700 In: 240 [P.O.:240] Out: 3800 [Urine:3800] Total I/O In: 240 [P.O.:240] Out: -    Physical Exam: General: Alert, awake, oriented x3, in no acute distress.  Heart: Regular rate and rhythm, without murmurs, rubs, gallops. Distant heart sounds. Lungs: CTA B. Abdomen: Soft,OBESE, nontender, nondistended, positive bowel sounds.  Rt thigh VAC  Extremities: right foot dressing/VAC in place. Boot on. Neuro: moves extremities ,intact non focal exam.    Lab Results: Results for orders placed during the hospital encounter of 02/18/11 (from the past 24 hour(s))  GLUCOSE, CAPILLARY     Status: Abnormal   Collection Time   03/02/11  4:46 PM      Component Value Range   Glucose-Capillary 163 (*) 70 - 99 (mg/dL)   Comment 1 Notify RN    GLUCOSE, CAPILLARY     Status: Abnormal   Collection Time   03/02/11 10:04 PM      Component Value Range   Glucose-Capillary 271 (*) 70 - 99 (mg/dL)  BASIC METABOLIC PANEL     Status: Abnormal   Collection Time   03/03/11  6:20 AM      Component Value Range   Sodium 138  135 - 145 (mEq/L)   Potassium 4.0  3.5 - 5.1 (mEq/L)   Chloride 94 (*) 96 - 112 (mEq/L)   CO2 40 (*) 19 - 32 (mEq/L)   Glucose, Bld 164 (*) 70 - 99 (mg/dL)   BUN 26 (*) 6 - 23 (mg/dL)   Creatinine, Ser 4.40  0.50 - 1.10 (mg/dL)   Calcium 34.7  8.4 - 10.5 (mg/dL)   GFR calc non Af Amer 74 (*) >90 (mL/min)   GFR calc Af Amer 86 (*) >90 (mL/min)  CBC     Status: Abnormal   Collection Time   03/03/11  6:20 AM      Component  Value Range   WBC 11.1 (*) 4.0 - 10.5 (K/uL)   RBC 3.61 (*) 3.87 - 5.11 (MIL/uL)   Hemoglobin 10.7 (*) 12.0 - 15.0 (g/dL)   HCT 42.5 (*) 95.6 - 46.0 (%)   MCV 95.0  78.0 - 100.0 (fL)   MCH 29.6  26.0 - 34.0 (pg)   MCHC 31.2  30.0 - 36.0 (g/dL)   RDW 38.7  56.4 - 33.2 (%)   Platelets 190  150 - 400 (K/uL)  DIFFERENTIAL     Status: Abnormal   Collection Time   03/03/11  6:20 AM      Component Value Range   Neutrophils Relative 47  43 - 77 (%)   Neutro Abs 5.2  1.7 - 7.7 (K/uL)   Lymphocytes Relative 34  12 - 46 (%)   Lymphs Abs 3.8  0.7 - 4.0 (K/uL)   Monocytes Relative 12  3 - 12 (%)   Monocytes Absolute 1.3 (*) 0.1 - 1.0 (K/uL)   Eosinophils Relative 6 (*) 0 - 5 (%)   Eosinophils Absolute 0.7  0.0 - 0.7 (K/uL)   Basophils Relative 1  0 - 1 (%)  Basophils Absolute 0.1  0.0 - 0.1 (K/uL)  GLUCOSE, CAPILLARY     Status: Abnormal   Collection Time   03/03/11  7:25 AM      Component Value Range   Glucose-Capillary 169 (*) 70 - 99 (mg/dL)   Comment 1 Notify RN    BLOOD GAS, ARTERIAL     Status: Abnormal   Collection Time   03/03/11 11:30 AM      Component Value Range   O2 Content 3.0     pH, Arterial 7.375  7.350 - 7.400    pCO2 arterial 61.1 (*) 35.0 - 45.0 (mmHg)   pO2, Arterial 76.9 (*) 80.0 - 100.0 (mmHg)   Bicarbonate 34.9 (*) 20.0 - 24.0 (mEq/L)   TCO2 32.2  0 - 100 (mmol/L)   Acid-Base Excess 8.4 (*) 0.0 - 2.0 (mmol/L)   O2 Saturation 96.0     Patient temperature 98.6     Collection site RIGHT RADIAL     Drawn by [id number]     Sample type ARTERIAL DRAW     Allens test (pass/fail) PASS  PASS   GLUCOSE, CAPILLARY     Status: Abnormal   Collection Time   03/03/11 11:36 AM      Component Value Range   Glucose-Capillary 335 (*) 70 - 99 (mg/dL)  GLUCOSE, CAPILLARY     Status: Abnormal   Collection Time   03/03/11 11:37 AM      Component Value Range   Glucose-Capillary 225 (*) 70 - 99 (mg/dL)    Studies/Results: Ct Hip Right Wo Contrast  03/03/2011  *RADIOLOGY REPORT*   Clinical Data: Possible right hip infection.  Ulcer over the right hip.  CT OF THE RIGHT HIP WITHOUT CONTRAST  Technique:  Multidetector CT imaging was performed according to the standard protocol. Multiplanar CT image reconstructions were also generated.  Comparison: CT pelvis 05/20/2008.  Findings: This study is limited by the patient's body habitus.  CT scan is not sensitive for the detection of osteomyelitis.  Given these limitations, no bony destructive change to suggest osteomyelitis is identified.  There is a soft tissue wound over the right greater trochanter extending into the deep subcutaneous tissues along the gluteus maximus.  No focal fluid collection is identified.  No right hip joint effusion is present.  The patient has a fat containing umbilical hernia. Also seen is a fatty lesion over the left aspect of the sacrum with some rim calcification.  Lesion measures approximately 5.2 cm transverse by 3.6 cm AP.  Imaged intrapelvic contents demonstrate a Foley catheter in place.  IMPRESSION:  1.  Soft tissue wound over the right hip.  No underlying evidence of abscess or osteomyelitis. 2.  Fat containing ventral hernia. 3.  There is calcification in subcutaneous fat over the sacrum may be due to old trauma or fat necrosis.  Original Report Authenticated By: Bernadene Bell. Maricela Curet, M.D.    Medications:    . benazepril  10 mg Oral Daily  . ceFEPime (MAXIPIME) IV  2 g Intravenous Q12H  . citalopram  60 mg Oral Daily  . enoxaparin  100 mg Subcutaneous QHS  . fentaNYL  400 mcg Transdermal Q72H  . fluticasone  2 spray Each Nare Daily  . gabapentin  300 mg Oral QHS  . insulin aspart  0-20 Units Subcutaneous TID WC  . insulin glargine  85 Units Subcutaneous QHS  . levothyroxine  112 mcg Oral Daily  . linagliptin  5 mg Oral Daily  . Liraglutide  1.8  mg Subcutaneous Daily  . loratadine  10 mg Oral Daily  . metoCLOPramide  10 mg Oral Q8H  . nystatin   Topical BID  . polyethylene glycol  17 g Oral  Daily  . pregabalin  75 mg Oral TID  . pseudoephedrine  120 mg Oral BID  . simvastatin  20 mg Oral Daily  . vancomycin  1,000 mg Intravenous Q12H    acetaminophen, acetaminophen, albuterol, carisoprodol, diazepam, HYDROmorphone, oxyCODONE, oxyCODONE-acetaminophen, senna-docusate     Assessment/Plan:  1. Lethargy-  Unknown etiology. Secondary to narcotic medications . CT head and CXR negative. Improved with decreased narcotics. Follow 2.. Diabetic heel ulcer  S/p I&D and right foot VAC  CT-scan of the heel showed no evidence of osteomyelitis.  Cont IV Vancomycin and Ceftazidime D#13,to complete 2 weeks from last I&D.weekly cbc, bmp and vancomycin level as per  Dr Zenaida Niece Dam's recommendations .  Continue Wound care ,may need another debridement as per ortho.  Ortho to reasses on tommorrow. Continue Pain control  2.Diabetes mellitus type 2 with complications, : fair,continue lantus, SSI, hold metformin .  3.Cough:  CXR negative for PNA. Symptomatic treatment. Repeat CXR. Give IV lasix follow. 4. R hip wound with wound vac : follow up with wound care ,plans to change vac MWF. Patient seen by Dr Kelly Splinter of plastics. CT hip to r/o osteomyelitis. Wound cx. F/U as outpatient.  5. Chronic pain syndrome: continue home dose of narcotics, i.e Fentanyl patch, dilaudid ,percocet  6.Constipation  Continue senokot s and miralax prn. Patient with BM yesterday. 7. Ventral hernia - reducible. Patient with BM and passing gas. F/U as outpatient. 8.Disposition:  Pending clearance by surgical service. PT recommended SNF ,patient is refusing  Will D/C with  HHPT/wheelchair, when stable for D/C.     LOS: 13 days   THOMPSON,DANIEL 03/03/2011, 2:56 PM

## 2011-03-03 NOTE — Progress Notes (Addendum)
Cm spoke with attending physician concerning need for completion of wound vac forms. Per Dr. [name], no plans for pt to d/c today. Ortho plans to reassess wound vac sites 12/10. CM notified RN to alert Ortho that forms are in the shadow chart. CM left sticky note for Orth[facility] Surgeons concerning forms.  Leonie Green [phone]

## 2011-03-04 LAB — BASIC METABOLIC PANEL
BUN: 25 mg/dL — ABNORMAL HIGH (ref 6–23)
CO2: 35 mEq/L — ABNORMAL HIGH (ref 19–32)
Calcium: 10 mg/dL (ref 8.4–10.5)
Chloride: 96 mEq/L (ref 96–112)
Creatinine, Ser: 0.82 mg/dL (ref 0.50–1.10)
GFR calc Af Amer: >90 mL/min (ref >90)
GFR calc non Af Amer: 87 mL/min — ABNORMAL LOW (ref >90)
Glucose, Bld: 140 mg/dL — ABNORMAL HIGH (ref 70–99)
Potassium: 4.1 mEq/L (ref 3.5–5.1)
Sodium: 137 mEq/L (ref 135–145)

## 2011-03-04 LAB — CBC
HCT: 34.2 % — ABNORMAL LOW (ref 36.0–46.0)
Hemoglobin: 10.6 g/dL — ABNORMAL LOW (ref 12.0–15.0)
MCH: 29.5 pg (ref 26.0–34.0)
MCHC: 31 g/dL (ref 30.0–36.0)
MCV: 95.3 fL (ref 78.0–100.0)
Platelets: 193 10*3/uL (ref 150–400)
RBC: 3.59 MIL/uL — ABNORMAL LOW (ref 3.87–5.11)
RDW: 12.8 % (ref 11.5–15.5)
WBC: 9.8 10*3/uL (ref 4.0–10.5)

## 2011-03-04 LAB — INFLUENZA PANEL BY PCR (TYPE A & B)
H1N1 flu by pcr: NOT DETECTED
Influenza A By PCR: NEGATIVE
Influenza B By PCR: NEGATIVE

## 2011-03-04 LAB — GLUCOSE, CAPILLARY
Glucose-Capillary: 128 mg/dL — ABNORMAL HIGH (ref 70–99)
Glucose-Capillary: 164 mg/dL — ABNORMAL HIGH (ref 70–99)
Glucose-Capillary: 123 mg/dL — ABNORMAL HIGH (ref 70–99)
Glucose-Capillary: 199 mg/dL — ABNORMAL HIGH (ref 70–99)

## 2011-03-04 MED ORDER — IPRATROPIUM BROMIDE 0.02 % IN SOLN
0.5000 mg | Freq: Three times a day (TID) | RESPIRATORY_TRACT | Status: DC
  Administered 2011-03-04 – 2011-03-08 (×10): 0.5 mg via RESPIRATORY_TRACT
  Filled 2011-03-04 (×9): qty 2.5

## 2011-03-04 MED ORDER — METHYLPREDNISOLONE SODIUM SUCC 125 MG IJ SOLR
80.0000 mg | Freq: Once | INTRAMUSCULAR | Status: AC
  Administered 2011-03-04: 80 mg via INTRAVENOUS
  Filled 2011-03-04: qty 1.28

## 2011-03-04 MED ORDER — LORATADINE 10 MG PO TABS
10.0000 mg | ORAL_TABLET | Freq: Two times a day (BID) | ORAL | Status: DC
  Administered 2011-03-04 – 2011-03-07 (×6): 10 mg via ORAL
  Filled 2011-03-04 (×10): qty 1

## 2011-03-04 MED ORDER — ALBUTEROL SULFATE (5 MG/ML) 0.5% IN NEBU
2.5000 mg | INHALATION_SOLUTION | Freq: Three times a day (TID) | RESPIRATORY_TRACT | Status: DC
  Administered 2011-03-04 – 2011-03-08 (×10): 2.5 mg via RESPIRATORY_TRACT
  Filled 2011-03-04 (×10): qty 0.5

## 2011-03-04 MED ORDER — IPRATROPIUM BROMIDE 0.02 % IN SOLN
0.5000 mg | RESPIRATORY_TRACT | Status: DC | PRN
  Filled 2011-03-04: qty 2.5

## 2011-03-04 NOTE — Progress Notes (Signed)
Subjective: Patient states heel pain is controlled. Patient c/o congestion and respiratory sxs.  Objective: Vital signs in last 24 hours: Temp:  [97.9 F (36.6 C)-98.3 F (36.8 C)] 98.3 F (36.8 C) (12/10 1417) Pulse Rate:  [70-101] 101  (12/10 1630) Resp:  [16-18] 18  (12/10 1417) BP: (84-130)/(48-76) 84/48 mmHg (12/10 1417) SpO2:  [94 %-95 %] 94 % (12/10 1630) Weight change:  Last BM Date: 03/02/11  Intake/Output from previous day: 12/09 0701 - 12/10 0700 In: 480 [P.O.:480] Out: 2300 [Urine:2300] Total I/O In: 240 [P.O.:240] Out: -    Physical Exam: General: Alert, awake, oriented x3, in no acute distress.  Heart: Regular rate and rhythm, without murmurs, rubs, gallops. Distant heart sounds. Lungs: coarse BS, min-mild exp wheezes. Abdomen: Soft,OBESE, nontender, nondistended, positive bowel sounds.  Rt thigh VAC  Extremities: right foot dressing/VAC in place. Boot on. Neuro: moves extremities ,intact non focal exam.    Lab Results: Results for orders placed during the hospital encounter of 02/18/11 (from the past 24 hour(s))  GLUCOSE, CAPILLARY     Status: Abnormal   Collection Time   03/03/11  9:29 PM      Component Value Range   Glucose-Capillary 205 (*) 70 - 99 (mg/dL)  BASIC METABOLIC PANEL     Status: Abnormal   Collection Time   03/04/11  5:30 AM      Component Value Range   Sodium 137  135 - 145 (mEq/L)   Potassium 4.1  3.5 - 5.1 (mEq/L)   Chloride 96  96 - 112 (mEq/L)   CO2 35 (*) 19 - 32 (mEq/L)   Glucose, Bld 140 (*) 70 - 99 (mg/dL)   BUN 25 (*) 6 - 23 (mg/dL)   Creatinine, Ser 1.61  0.50 - 1.10 (mg/dL)   Calcium 09.6  8.4 - 10.5 (mg/dL)   GFR calc non Af Amer 87 (*) >90 (mL/min)   GFR calc Af Amer >90  >90 (mL/min)  CBC     Status: Abnormal   Collection Time   03/04/11  5:30 AM      Component Value Range   WBC 9.8  4.0 - 10.5 (K/uL)   RBC 3.59 (*) 3.87 - 5.11 (MIL/uL)   Hemoglobin 10.6 (*) 12.0 - 15.0 (g/dL)   HCT 04.5 (*) 40.9 - 46.0 (%)   MCV 95.3  78.0 - 100.0 (fL)   MCH 29.5  26.0 - 34.0 (pg)   MCHC 31.0  30.0 - 36.0 (g/dL)   RDW 81.1  91.4 - 78.2 (%)   Platelets 193  150 - 400 (K/uL)  GLUCOSE, CAPILLARY     Status: Abnormal   Collection Time   03/04/11  7:43 AM      Component Value Range   Glucose-Capillary 128 (*) 70 - 99 (mg/dL)   Comment 1 Notify RN    GLUCOSE, CAPILLARY     Status: Abnormal   Collection Time   03/04/11 11:43 AM      Component Value Range   Glucose-Capillary 164 (*) 70 - 99 (mg/dL)   Comment 1 Notify RN    GLUCOSE, CAPILLARY     Status: Abnormal   Collection Time   03/04/11  4:52 PM      Component Value Range   Glucose-Capillary 123 (*) 70 - 99 (mg/dL)   Comment 1 STAT Lab     Comment 2 Notify RN      Studies/Results: Ct Hip Right Wo Contrast  03/03/2011  *RADIOLOGY REPORT*  Clinical Data: Possible right  hip infection.  Ulcer over the right hip.  CT OF THE RIGHT HIP WITHOUT CONTRAST  Technique:  Multidetector CT imaging was performed according to the standard protocol. Multiplanar CT image reconstructions were also generated.  Comparison: CT pelvis 05/20/2008.  Findings: This study is limited by the patient's body habitus.  CT scan is not sensitive for the detection of osteomyelitis.  Given these limitations, no bony destructive change to suggest osteomyelitis is identified.  There is a soft tissue wound over the right greater trochanter extending into the deep subcutaneous tissues along the gluteus maximus.  No focal fluid collection is identified.  No right hip joint effusion is present.  The patient has a fat containing umbilical hernia. Also seen is a fatty lesion over the left aspect of the sacrum with some rim calcification.  Lesion measures approximately 5.2 cm transverse by 3.6 cm AP.  Imaged intrapelvic contents demonstrate a Foley catheter in place.  IMPRESSION:  1.  Soft tissue wound over the right hip.  No underlying evidence of abscess or osteomyelitis. 2.  Fat containing ventral hernia.  3.  There is calcification in subcutaneous fat over the sacrum may be due to old trauma or fat necrosis.  Original Report Authenticated By: Bernadene Bell. Maricela Curet, M.D.    Medications:    . benazepril  10 mg Oral Daily  . ceFEPime (MAXIPIME) IV  2 g Intravenous Q12H  . citalopram  60 mg Oral Daily  . enoxaparin  100 mg Subcutaneous QHS  . fentaNYL  400 mcg Transdermal Q72H  . fluticasone  2 spray Each Nare Daily  . gabapentin  300 mg Oral QHS  . insulin aspart  0-20 Units Subcutaneous TID WC  . insulin glargine  85 Units Subcutaneous QHS  . levothyroxine  112 mcg Oral Daily  . linagliptin  5 mg Oral Daily  . Liraglutide  1.8 mg Subcutaneous Daily  . loratadine  10 mg Oral Daily  . metoCLOPramide  10 mg Oral Q8H  . nystatin   Topical BID  . polyethylene glycol  17 g Oral Daily  . pregabalin  75 mg Oral TID  . pseudoephedrine  120 mg Oral BID  . simvastatin  20 mg Oral Daily  . vancomycin  1,000 mg Intravenous Q12H    acetaminophen, acetaminophen, albuterol, carisoprodol, diazepam, HYDROmorphone, oxyCODONE, oxyCODONE-acetaminophen, senna-docusate     Assessment/Plan:  1. Lethargy-  Unknown etiology. Secondary to narcotic medications . CT head and CXR negative. Improved with decreased narcotics. Resolved 2.. Diabetic heel ulcer  S/p I&D and right foot VAC  CT-scan of the heel showed no evidence of osteomyelitis.  Cont IV Vancomycin and Ceftazidime D#14,to complete 2 weeks from last I&D.weekly cbc, bmp and vancomycin level as per  Dr Zenaida Niece Dam's recommendations .  Continue Wound care ,may need another debridement as per ortho.  Ortho to reasses today. Continue Pain control  2.Diabetes mellitus type 2 with complications, : fair,continue lantus, SSI, hold metformin .  3.Cough/Congestion  CXR negative for PNA. Symptomatic treatment. Repeat CXR in AM. Check influenza PCR. Place on nebs. Give 1 dose of IV solumedrol and follow. 4. R hip wound with wound vac : follow up with wound care  ,plans to change vac MWF. Patient seen by Dr Kelly Splinter of plastics. CT hip to r/o osteomyelitis. Wound cx. F/U as outpatient.  5. Chronic pain syndrome: continue home dose of narcotics, i.e Fentanyl patch, dilaudid ,percocet  6.Constipation  Continue senokot s and miralax prn. Patient with BM yesterday. 7. Ventral hernia -  reducible. Patient with BM and passing gas. F/U as outpatient. 8.Disposition:  Pending clearance by surgical service. PT recommended SNF ,patient is refusing  Will D/C with  HHPT/wheelchair, when stable for D/C.     LOS: 14 days   Dicie Edelen 03/04/2011, 5:56 PM

## 2011-03-04 NOTE — Progress Notes (Signed)
CONTACTED DR. SCOTT DEAN'S  OFFICE ABOUT WOUND VAC AUTH FORM FOR SIGNATURE,SPOKE TO KAY9COVERING DESK),HAVE FAXED FORM TO OFFICE W/CONFIRMATION. WILL AWAIT SIGNATURE/NPI,DATE.

## 2011-03-04 NOTE — Progress Notes (Signed)
Wound vac changed to right hip.  White foam applied inside of wound and black foam "bridged" on top of that.  New connector applied to the "Y" site.  Patient refuses for RN to change the wound vac on her right heel at this time because patient reports that the ortho MD is supposed to be rounding on her tonight to look at the site.  Will pass this on the night RN.

## 2011-03-04 NOTE — Progress Notes (Signed)
Physical Therapy Treatment Patient Details Name: [name] MRN: [id number] DOB: 13-Apr-1970 Today's Date: 03/04/2011 [id number] 1TA, 2W/C  PT Assessment/Plan  PT - Assessment/Plan Comments on Treatment Session: Patient progressing with activity tolerance despite need for oxygen and noted audible crackles when first arrived and pt in bed, though improved with activity.  Will need 24 hour assist at d/c. PT Plan: Discharge plan remains appropriate PT Frequency: Min 3X/week Follow Up Recommendations: Home health PT;24 hour supervision/assistance Equipment Recommended: Wheelchair (measurements);Wheelchair cushion (measurements);Other (comment) (appropriate walking boot) PT Goals  Acute Rehab PT Goals PT Goal: Supine/Side to Sit - Progress: Progressing toward goal Pt will go Sit to Stand: with modified independence PT Goal: Sit to Stand - Progress: Progressing toward goal Pt will go Stand to Sit: with modified independence PT Goal: Stand to Sit - Progress: Progressing toward goal PT Goal: Propel Wheelchair - Progress: Progressing toward goal  PT Treatment Precautions/Restrictions  Precautions Precautions: Fall Precaution Comments: wound vac to R hip and heel with ankle splint to float heel Required Braces or Orthoses: Yes Other Brace/Splint: right PRAFO  ([employer] supposedly coming today to fit with walking boot) Restrictions Weight Bearing Restrictions: No Other Position/Activity Restrictions: Utilized toe-touch/TDWB on forefoot so as to restrict weight through heel Mobility (including Balance) Bed Mobility Supine to Sit: HOB elevated (Comment degrees);4: Min assist Supine to Sit Details (indicate cue type and reason): HOB at 40-50 degrees.  Able to scoot, needed assist for upper body lifting Sitting - Scoot to Edge of Bed: 6: Modified independent (Device/Increase time) Sit to Supine - Right: 5: Supervision;HOB flat Sit to Supine - Right Details (indicate cue type and reason):  cue for side first due to h/o back difficulty and for positioning after supine Transfers Sit to Stand: 4: Min assist;From bed;With upper extremity assist Sit to Stand Details (indicate cue type and reason): cue to push from bed Stand Pivot Transfers: 4: Min assist Stand Pivot Transfer Details (indicate cue type and reason): with walker and cues for protected weight right heel Ambulation/Gait Ambulation/Gait: No (secondary to no walking boot to decr. heel wt. bearing) Corporate treasurer: Yes Wheelchair Assistance: 4: Min assist;5: Financial planner Details (indicate cue type and reason): supervision initially, when patient fatigued, assisted back to room Wheelchair Propulsion: Both upper extremities Wheelchair Parts Management: Needs assistance Distance: 3' with multiple stops to rest    Exercise    End of Session PT - End of Session Equipment Utilized During Treatment: Right ankle foot orthosis Activity Tolerance: Patient limited by fatigue Patient left: in bed General Behavior During Session: [facility] for tasks performed Cognition: [facility] for tasks performed  [facility] 03/04/2011, 5:02 PM

## 2011-03-04 NOTE — Progress Notes (Signed)
ANTIBIOTIC CONSULT NOTE - Follow up  Pharmacy Consult for Vancomcyin and Cefepime Indication: Infected diabetic foot wound  Allergies  Allergen Reactions  . Sulfa Antibiotics Hives    Patient Measurements: Height: 5\' 5"  (165.1 cm) Weight: 451 lb (204.572 kg) IBW/kg (Calculated) : 57    Vital Signs: Temp: 97.9 F (36.6 C) (12/10 0600) Temp src: Oral (12/10 0600) BP: 102/52 mmHg (12/10 0600) Pulse Rate: 77  (12/10 0600)  Labs:  Basename 03/04/11 0530 03/03/11 0620 03/02/11 0530  WBC 9.8 11.1* 17.0*  HGB 10.6* 10.7* 10.7*  PLT 193 190 192  LABCREA -- -- --  CREATININE 0.82 0.94 0.83   Estimated Creatinine Clearance: 163.7 ml/min (by C-G formula based on Cr of 0.82).   Microbiology: Recent Results (from the past 720 hour(s))  MRSA PCR SCREENING     Status: Abnormal   Collection Time   02/20/11  1:41 PM      Component Value Range Status Comment   MRSA by PCR POSITIVE (*) NEGATIVE  Final   URINE CULTURE     Status: Normal   Collection Time   03/02/11  8:42 AM      Component Value Range Status Comment   Specimen Description URINE, RANDOM   Final    Special Requests NONE   Final    Setup Time [id number]   Final    Colony Count 85,000 COLONIES/ML   Final    Culture YEAST   Final    Report Status 03/03/2011 FINAL   Final   WOUND CULTURE     Status: Normal (Preliminary result)   Collection Time   03/03/11  1:23 PM      Component Value Range Status Comment   Specimen Description LEG RIGHT HIP WOUND   Final    Special Requests NONE   Final    Gram Stain PENDING   Incomplete    Culture Culture reincubated for better growth   Final    Report Status PENDING   Incomplete    Medications:  Scheduled:     . benazepril  10 mg Oral Daily  . ceFEPime (MAXIPIME) IV  2 g Intravenous Q12H  . citalopram  60 mg Oral Daily  . enoxaparin  100 mg Subcutaneous QHS  . fentaNYL  400 mcg Transdermal Q72H  . fluticasone  2 spray Each Nare Daily  . gabapentin  300 mg Oral QHS  .  insulin aspart  0-20 Units Subcutaneous TID WC  . insulin glargine  85 Units Subcutaneous QHS  . levothyroxine  112 mcg Oral Daily  . linagliptin  5 mg Oral Daily  . Liraglutide  1.8 mg Subcutaneous Daily  . loratadine  10 mg Oral Daily  . metoCLOPramide  10 mg Oral Q8H  . nystatin   Topical BID  . polyethylene glycol  17 g Oral Daily  . pregabalin  75 mg Oral TID  . pseudoephedrine  120 mg Oral BID  . simvastatin  20 mg Oral Daily  . vancomycin  1,000 mg Intravenous Q12H   Assessment:  Day #14/15 (LOT = 14 days since last I&D per Id recommendation) Vancomycin 1g IV q12 and Cefepime 2g IV q12 for diabetic heel ulcer.  Vancomycin trough was therapeutic 02/26/11.  Serum creatinine wnl and essentially stable.    Also has chronic R hip wound with wound vac - no evidence of infection here per ID service   Plan:  Continue Vanc 1g IV q12h and Cefepime 2 g IV q12h to finish out regimen  per ID recommendation.    Elie Goody, PharmD  [phone] 03/04/2011 11:42 AM

## 2011-03-05 ENCOUNTER — Inpatient Hospital Stay (HOSPITAL_COMMUNITY): Payer: Medicare Other

## 2011-03-05 DIAGNOSIS — J069 Acute upper respiratory infection, unspecified: Secondary | ICD-10-CM

## 2011-03-05 HISTORY — DX: Acute upper respiratory infection, unspecified: J06.9

## 2011-03-05 LAB — CBC
HCT: 35.8 % — ABNORMAL LOW (ref 36.0–46.0)
Hemoglobin: 11.4 g/dL — ABNORMAL LOW (ref 12.0–15.0)
MCH: 29.8 pg (ref 26.0–34.0)
MCHC: 31.8 g/dL (ref 30.0–36.0)
MCV: 93.7 fL (ref 78.0–100.0)
Platelets: 220 10*3/uL (ref 150–400)
RBC: 3.82 MIL/uL — ABNORMAL LOW (ref 3.87–5.11)
RDW: 12.7 % (ref 11.5–15.5)
WBC: 13.2 10*3/uL — ABNORMAL HIGH (ref 4.0–10.5)

## 2011-03-05 LAB — GLUCOSE, CAPILLARY
Glucose-Capillary: 269 mg/dL — ABNORMAL HIGH (ref 70–99)
Glucose-Capillary: 280 mg/dL — ABNORMAL HIGH (ref 70–99)
Glucose-Capillary: 268 mg/dL — ABNORMAL HIGH (ref 70–99)
Glucose-Capillary: 281 mg/dL — ABNORMAL HIGH (ref 70–99)

## 2011-03-05 LAB — BASIC METABOLIC PANEL
BUN: 33 mg/dL — ABNORMAL HIGH (ref 6–23)
BUN: 32 mg/dL — ABNORMAL HIGH (ref 6–23)
CO2: 33 mEq/L — ABNORMAL HIGH (ref 19–32)
CO2: 33 mEq/L — ABNORMAL HIGH (ref 19–32)
Calcium: 10.3 mg/dL (ref 8.4–10.5)
Calcium: 10.2 mg/dL (ref 8.4–10.5)
Chloride: 94 mEq/L — ABNORMAL LOW (ref 96–112)
Chloride: 94 mEq/L — ABNORMAL LOW (ref 96–112)
Creatinine, Ser: 0.88 mg/dL (ref 0.50–1.10)
Creatinine, Ser: 0.85 mg/dL (ref 0.50–1.10)
GFR calc Af Amer: >90 mL/min (ref >90)
GFR calc Af Amer: >90 mL/min (ref >90)
GFR calc non Af Amer: 80 mL/min — ABNORMAL LOW (ref >90)
GFR calc non Af Amer: 83 mL/min — ABNORMAL LOW (ref >90)
Glucose, Bld: 354 mg/dL — ABNORMAL HIGH (ref 70–99)
Glucose, Bld: 330 mg/dL — ABNORMAL HIGH (ref 70–99)
Potassium: 5.4 mEq/L — ABNORMAL HIGH (ref 3.5–5.1)
Potassium: 4.9 mEq/L (ref 3.5–5.1)
Sodium: 132 mEq/L — ABNORMAL LOW (ref 135–145)
Sodium: 133 mEq/L — ABNORMAL LOW (ref 135–145)

## 2011-03-05 IMAGING — CR DG CHEST 1V PORT
1 series · 2 of 2 positions shown · non-contrast
Comparison: Multiple prior chest films.

CLINICAL DATA: Pneumonia.

PORTABLE CHEST - 1 VIEW

[Series 1: AP · U · 2 of 2 slices shown]
[im 1/2]
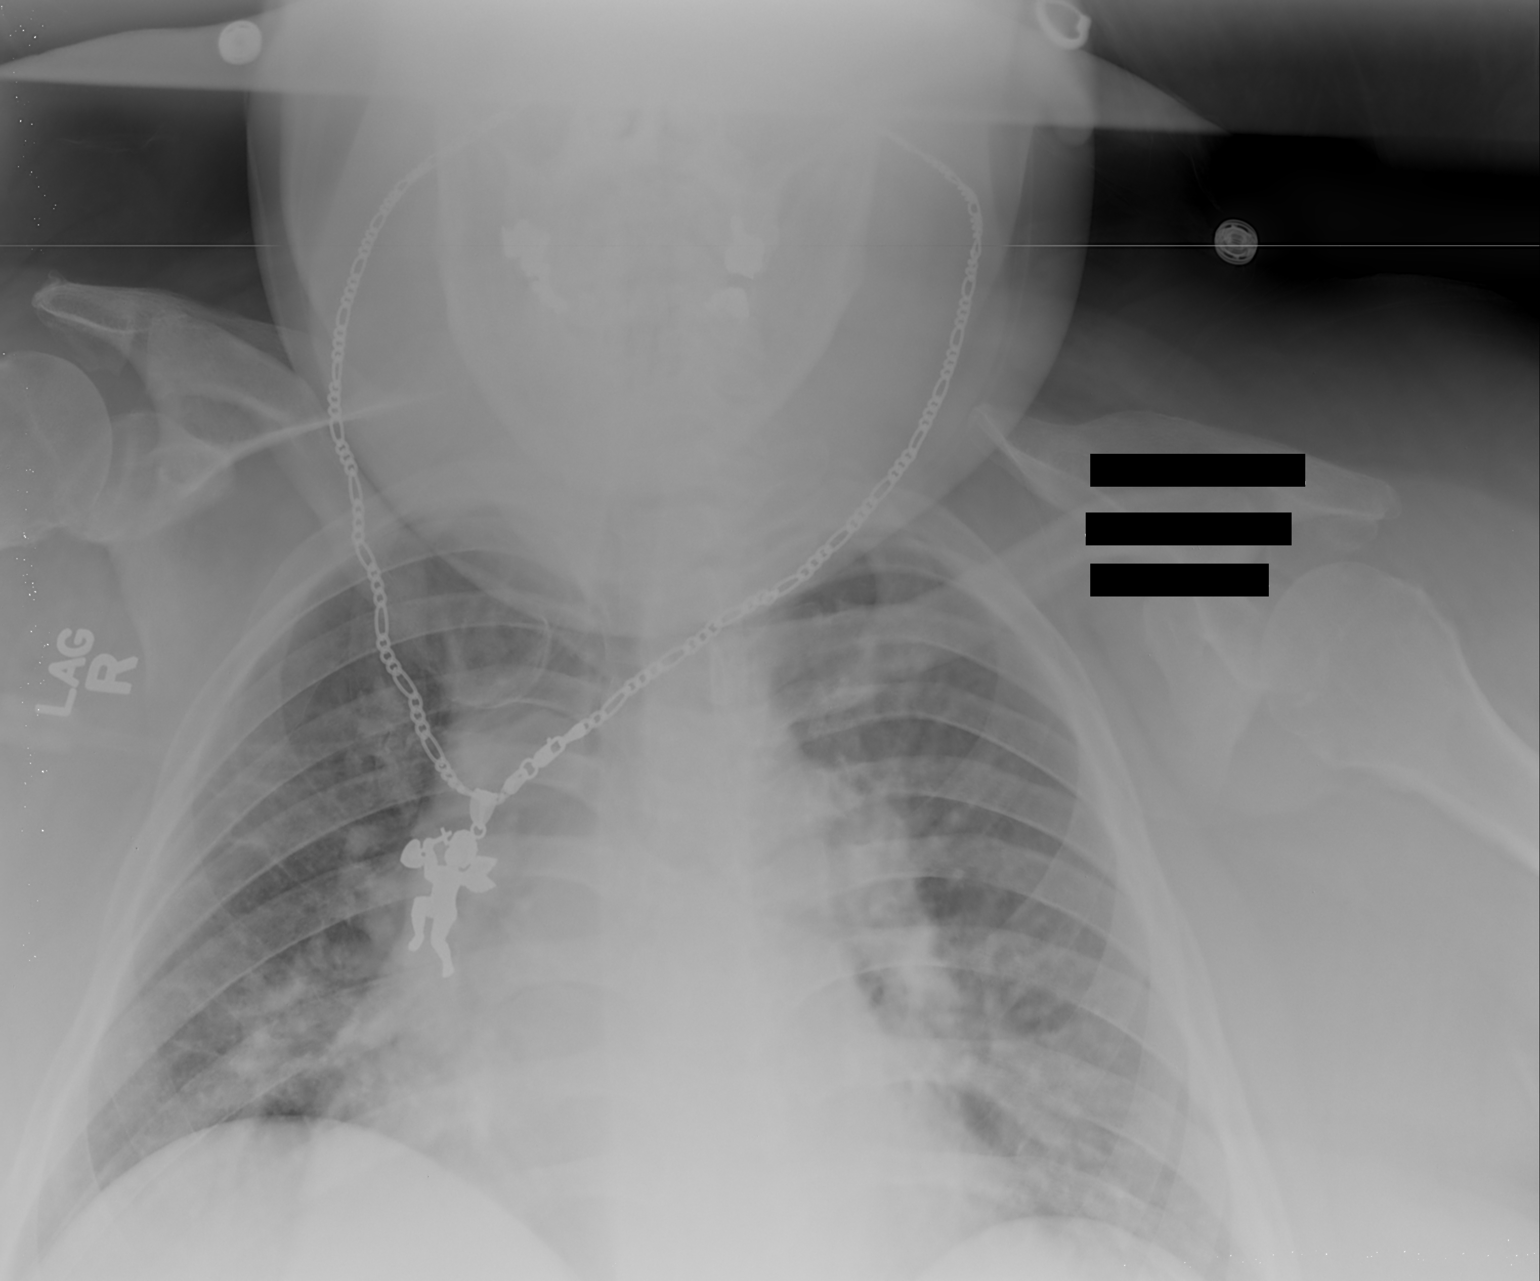
[im 2/2]
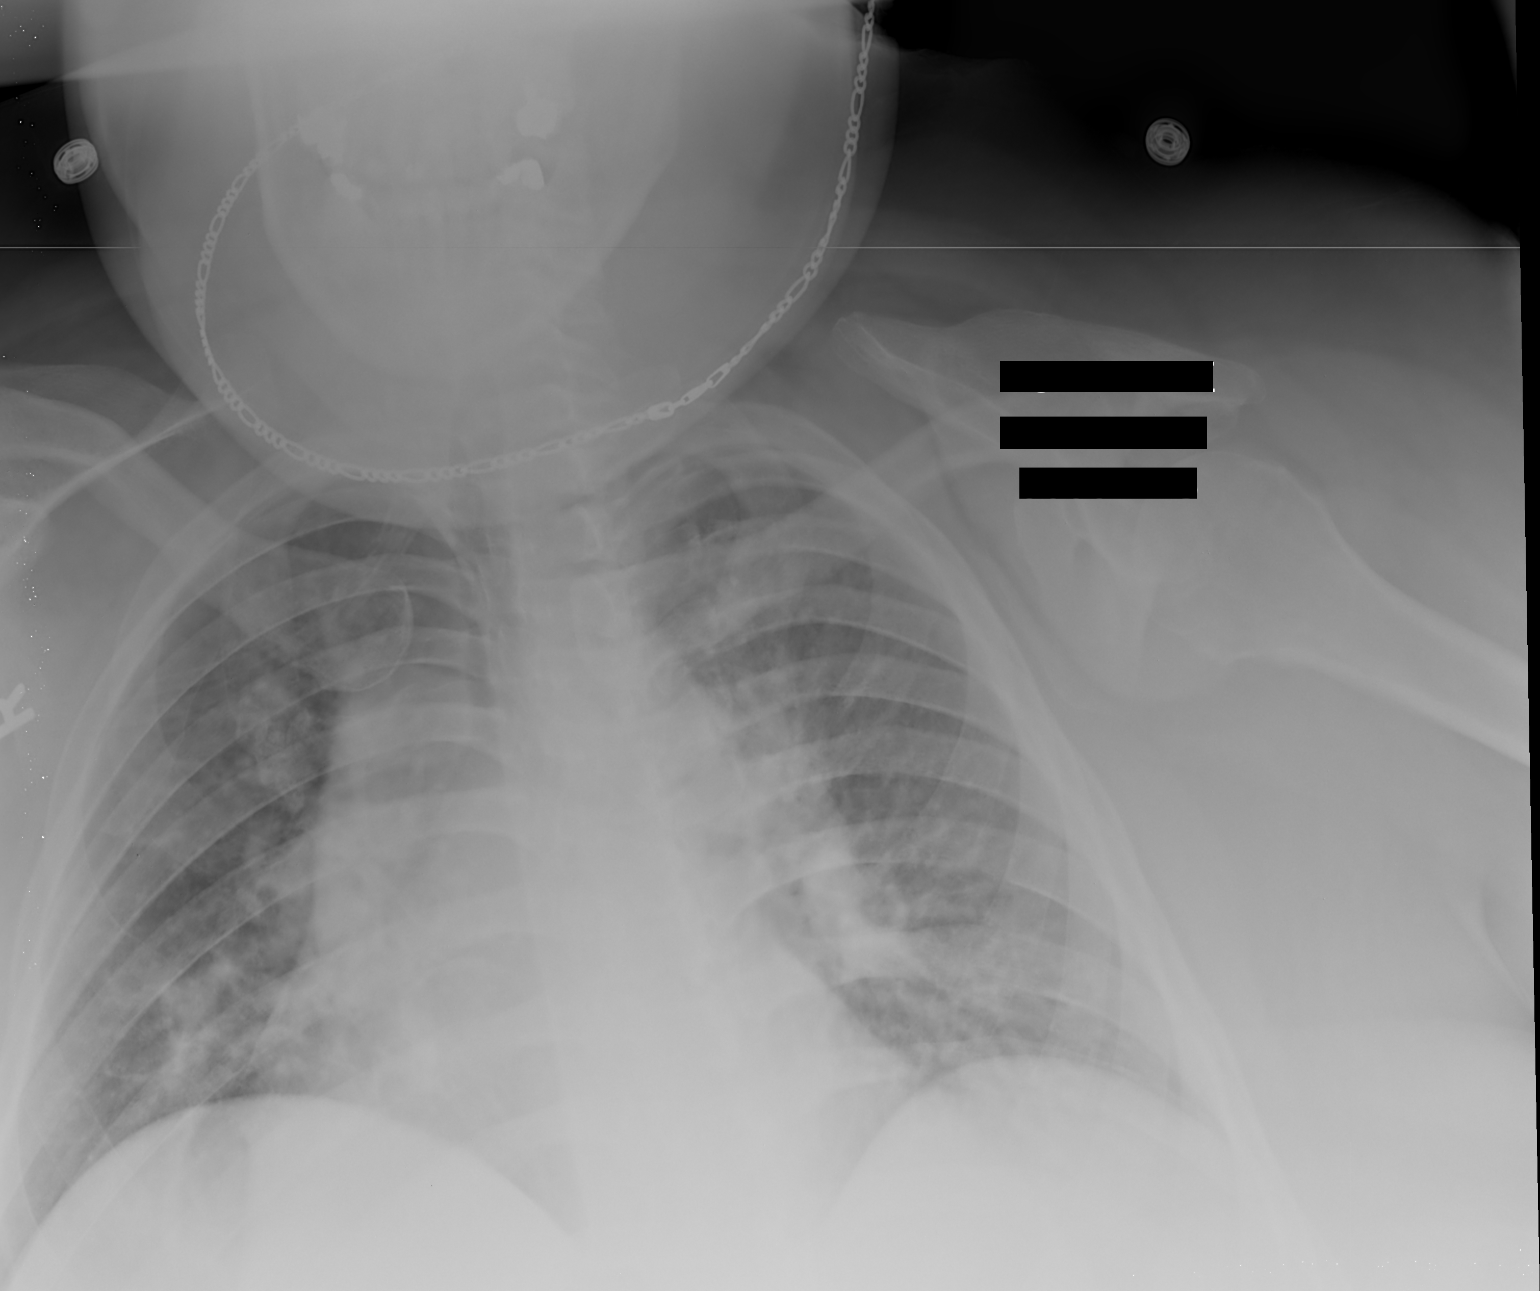

[2 of 2 positions shown; findings below may reference images not displayed]

FINDINGS: The cardiac silhouette, mediastinal and hilar contours
are prominent but unchanged.  There is persistent central vascular
congestion and peribronchial thickening.  No focal airspace
consolidation or definite pleural effusion.  No pneumothorax.
IMPRESSION: Stable chest x-ray findings.  Slightly lower lung volumes.

## 2011-03-05 MED ORDER — SODIUM POLYSTYRENE SULFONATE 15 GM/60ML PO SUSP
45.0000 g | Freq: Once | ORAL | Status: AC
  Administered 2011-03-05: 45 g via ORAL
  Filled 2011-03-05: qty 180

## 2011-03-05 NOTE — Progress Notes (Signed)
I can see her again if needed before her discharge date. Please page me the day before her discharge [phone] thanks

## 2011-03-05 NOTE — Progress Notes (Signed)
Subjective: Patient states heel pain is controlled. Patient states congestion and respiratory sxs improving, with current treatment.  Objective: Vital signs in last 24 hours: Temp:  [98 F (36.7 C)-98.4 F (36.9 C)] 98.2 F (36.8 C) (12/11 1615) Pulse Rate:  [63-98] 98  (12/11 1615) Resp:  [18-20] 18  (12/11 1615) BP: (141-149)/(75-83) 142/83 mmHg (12/11 1615) SpO2:  [93 %-95 %] 93 % (12/11 1615) Weight change:  Last BM Date: 03/05/11  Intake/Output from previous day: 12/10 0701 - 12/11 0700 In: 240 [P.O.:240] Out: 1501 [Urine:1500; Stool:1] Total I/O In: -  Out: 901 [Urine:900; Stool:1]   Physical Exam: General: Alert, awake, oriented x3, in no acute distress.  Heart: Regular rate and rhythm, without murmurs, rubs, gallops. Distant heart sounds. Lungs: CTAB. Abdomen: Soft,OBESE, nontender, nondistended, positive bowel sounds.  Rt thigh VAC  Extremities: right foot dressing/VAC in place. Boot on. Neuro: moves extremities ,intact non focal exam.    Lab Results: Results for orders placed during the hospital encounter of 02/18/11 (from the past 24 hour(s))  INFLUENZA PANEL BY PCR     Status: Normal   Collection Time   03/04/11  5:27 PM      Component Value Range   Influenza A By PCR NEGATIVE  NEGATIVE    Influenza B By PCR NEGATIVE  NEGATIVE    H1N1 flu by pcr NOT DETECTED  NOT DETECTED   GLUCOSE, CAPILLARY     Status: Abnormal   Collection Time   03/04/11  8:54 PM      Component Value Range   Glucose-Capillary 199 (*) 70 - 99 (mg/dL)   Comment 1 Notify RN    CBC     Status: Abnormal   Collection Time   03/05/11  5:30 AM      Component Value Range   WBC 13.2 (*) 4.0 - 10.5 (K/uL)   RBC 3.82 (*) 3.87 - 5.11 (MIL/uL)   Hemoglobin 11.4 (*) 12.0 - 15.0 (g/dL)   HCT 16.1 (*) 09.6 - 46.0 (%)   MCV 93.7  78.0 - 100.0 (fL)   MCH 29.8  26.0 - 34.0 (pg)   MCHC 31.8  30.0 - 36.0 (g/dL)   RDW 04.5  40.9 - 81.1 (%)   Platelets 220  150 - 400 (K/uL)  BASIC METABOLIC  PANEL     Status: Abnormal   Collection Time   03/05/11  5:30 AM      Component Value Range   Sodium 132 (*) 135 - 145 (mEq/L)   Potassium 5.4 (*) 3.5 - 5.1 (mEq/L)   Chloride 94 (*) 96 - 112 (mEq/L)   CO2 33 (*) 19 - 32 (mEq/L)   Glucose, Bld 354 (*) 70 - 99 (mg/dL)   BUN 33 (*) 6 - 23 (mg/dL)   Creatinine, Ser 9.14  0.50 - 1.10 (mg/dL)   Calcium 78.2  8.4 - 10.5 (mg/dL)   GFR calc non Af Amer 80 (*) >90 (mL/min)   GFR calc Af Amer >90  >90 (mL/min)  GLUCOSE, CAPILLARY     Status: Abnormal   Collection Time   03/05/11  8:01 AM      Component Value Range   Glucose-Capillary 269 (*) 70 - 99 (mg/dL)   Comment 1 Notify RN    BASIC METABOLIC PANEL     Status: Abnormal   Collection Time   03/05/11 12:56 PM      Component Value Range   Sodium 133 (*) 135 - 145 (mEq/L)   Potassium 4.9  3.5 -  5.1 (mEq/L)   Chloride 94 (*) 96 - 112 (mEq/L)   CO2 33 (*) 19 - 32 (mEq/L)   Glucose, Bld 330 (*) 70 - 99 (mg/dL)   BUN 32 (*) 6 - 23 (mg/dL)   Creatinine, Ser 1.61  0.50 - 1.10 (mg/dL)   Calcium 09.6  8.4 - 10.5 (mg/dL)   GFR calc non Af Amer 83 (*) >90 (mL/min)   GFR calc Af Amer >90  >90 (mL/min)  GLUCOSE, CAPILLARY     Status: Abnormal   Collection Time   03/05/11  1:05 PM      Component Value Range   Glucose-Capillary 280 (*) 70 - 99 (mg/dL)   Comment 1 Notify RN      Studies/Results: Dg Chest Port 1 View  03/05/2011  *RADIOLOGY REPORT*  Clinical Data: Pneumonia.  PORTABLE CHEST - 1 VIEW  Comparison: Multiple prior chest films.  Findings: The cardiac silhouette, mediastinal and hilar contours are prominent but unchanged.  There is persistent central vascular congestion and peribronchial thickening.  No focal airspace consolidation or definite pleural effusion.  No pneumothorax.  IMPRESSION: Stable chest x-ray findings.  Slightly lower lung volumes.  Original Report Authenticated By: P. Loralie Champagne, M.D.    Medications:    . albuterol  2.5 mg Nebulization TID  . benazepril  10  mg Oral Daily  . ceFEPime (MAXIPIME) IV  2 g Intravenous Q12H  . citalopram  60 mg Oral Daily  . enoxaparin  100 mg Subcutaneous QHS  . fentaNYL  400 mcg Transdermal Q72H  . fluticasone  2 spray Each Nare Daily  . gabapentin  300 mg Oral QHS  . insulin aspart  0-20 Units Subcutaneous TID WC  . insulin glargine  85 Units Subcutaneous QHS  . ipratropium  0.5 mg Nebulization TID  . levothyroxine  112 mcg Oral Daily  . linagliptin  5 mg Oral Daily  . Liraglutide  1.8 mg Subcutaneous Daily  . loratadine  10 mg Oral BID  . methylPREDNISolone (SOLU-MEDROL) injection  80 mg Intravenous Once  . metoCLOPramide  10 mg Oral Q8H  . nystatin   Topical BID  . polyethylene glycol  17 g Oral Daily  . pregabalin  75 mg Oral TID  . pseudoephedrine  120 mg Oral BID  . simvastatin  20 mg Oral Daily  . sodium polystyrene  45 g Oral Once  . vancomycin  1,000 mg Intravenous Q12H  . DISCONTD: loratadine  10 mg Oral Daily    acetaminophen, acetaminophen, albuterol, carisoprodol, diazepam, HYDROmorphone, ipratropium, oxyCODONE, oxyCODONE-acetaminophen, senna-docusate     Assessment/Plan:  1. Lethargy-  Unknown etiology. Secondary to narcotic medications . CT head and CXR negative. Improved with decreased narcotics. Resolved 2.. Diabetic heel ulcer  S/p I&D and right foot VAC  CT-scan of the heel showed no evidence of osteomyelitis.  Cont IV Vancomycin and Ceftazidime D#15,to complete 2 weeks from last I&D.weekly cbc, bmp and vancomycin level as per  Dr Zenaida Niece Dam's recommendations .  Continue Wound care ,may need another debridement as per ortho.  Ortho to reasses today. Will d/c antibiotics. Follow. Continue Pain control  2.Diabetes mellitus type 2 with complications, : fair,continue lantus, SSI, hold metformin .  3.Cough/Congestion  CXR negative for PNA. Symptomatic treatment. Repeat CXR negative. Clinical improvement.  influenza PCR negative. Place on nebs.  4. R hip wound with wound vac : follow up  with wound care ,plans to change vac MWF. Patient seen by Dr Kelly Splinter of plastics. CT hip negative for  abscess or osteomyelitis. Wound cx pending. F/U as outpatient with Dr Kelly Splinter at wound care. 5. Chronic pain syndrome: continue home dose of narcotics, i.e Fentanyl patch, dilaudid ,percocet  6.Constipation  Continue senokot s and miralax prn. Resolved. 7. Ventral hernia - reducible. Patient with BM and passing gas. F/U as outpatient. 8.Disposition:  Pending clearance by surgical service. PT recommended SNF ,patient is refusing  Will D/C with  HHPT/wheelchair, when stable for D/C. Will need outpatient w/up for OSA . Appointment made to see Dr Nadyne Coombes pulm on 03/29/11 at 11am.     LOS: 15 days   [name],[name] 03/05/2011, 5:15 PM

## 2011-03-05 NOTE — Progress Notes (Signed)
Inpatient Diabetes Program Recommendations  AACE/ADA: New Consensus Statement on Inpatient Glycemic Control (2009)  Target Ranges:  Prepandial:   less than 140 mg/dL      Peak postprandial:   less than 180 mg/dL (1-2 hours)      Critically ill patients:  140 - 180 mg/dL   Reason for Visit: Hyperglycemia  Inpatient Diabetes Program Recommendations Insulin - Basal: Increase to Lantus 90 QHS Insulin - Meal Coverage: Novolog 3 units tidwc if pt eats >50% meal HgbA1C: 6.8% on 03/02/2011  Note:

## 2011-03-05 NOTE — Progress Notes (Signed)
Paul is examined today. The wound VAC is removed from her right heel. In general the heel looks significantly improved. There is about 25% an area that is debrided today with a 10 blade. Continue wound VAC for approximately 2-3 more weeks at which time an artificial skin graft can be applied in our office. We'll discuss this with Dr. due to. From the orthopedic perspective shoulder are formal operative surgical debridement. I'll see her back in clinic in 2 weeks. Should remain nonweightbearing on the right heel until such time as the skin graft has healed which we will place on her healing clinic in approximately 2 weeks.

## 2011-03-06 LAB — WOUND CULTURE: Gram Stain: NONE SEEN

## 2011-03-06 LAB — BASIC METABOLIC PANEL
BUN: 28 mg/dL — ABNORMAL HIGH (ref 6–23)
CO2: 34 mEq/L — ABNORMAL HIGH (ref 19–32)
Calcium: 10.3 mg/dL (ref 8.4–10.5)
Chloride: 97 mEq/L (ref 96–112)
Creatinine, Ser: 0.79 mg/dL (ref 0.50–1.10)
GFR calc Af Amer: >90 mL/min (ref >90)
GFR calc non Af Amer: >90 mL/min (ref >90)
Glucose, Bld: 139 mg/dL — ABNORMAL HIGH (ref 70–99)
Potassium: 4.1 mEq/L (ref 3.5–5.1)
Sodium: 137 mEq/L (ref 135–145)

## 2011-03-06 LAB — CBC
HCT: 31.8 % — ABNORMAL LOW (ref 36.0–46.0)
Hemoglobin: 9.6 g/dL — ABNORMAL LOW (ref 12.0–15.0)
MCH: 28.7 pg (ref 26.0–34.0)
MCHC: 30.2 g/dL (ref 30.0–36.0)
MCV: 94.9 fL (ref 78.0–100.0)
Platelets: 195 10*3/uL (ref 150–400)
RBC: 3.35 MIL/uL — ABNORMAL LOW (ref 3.87–5.11)
RDW: 12.9 % (ref 11.5–15.5)
WBC: 9.6 10*3/uL (ref 4.0–10.5)

## 2011-03-06 LAB — GLUCOSE, CAPILLARY
Glucose-Capillary: 88 mg/dL (ref 70–99)
Glucose-Capillary: 187 mg/dL — ABNORMAL HIGH (ref 70–99)
Glucose-Capillary: 196 mg/dL — ABNORMAL HIGH (ref 70–99)
Glucose-Capillary: 102 mg/dL — ABNORMAL HIGH (ref 70–99)

## 2011-03-06 NOTE — Progress Notes (Signed)
Subjective: Pt c/o pain in Rt. Heel since debridement yesterday. Wound culture reviewed Objective: Filed Vitals:   03/06/11 0657 03/06/11 0820 03/06/11 1400 03/06/11 1401  BP: 93/61  117/70   Pulse: 68  74   Temp: 97.9 F (36.6 C)  98.4 F (36.9 C)   TempSrc: Oral  Oral   Resp: 18  18   Height:      Weight:      SpO2: 99% 98% 99% 99%   Weight change:   Intake/Output Summary (Last 24 hours) at 03/06/11 1754 Last data filed at 03/06/11 1752  Gross per 24 hour  Intake    480 ml  Output   3800 ml  Net  -3320 ml    General:Morbidly obese female. Alert, awake, oriented x3, in no acute distress.  HEENT: [place]/AT PEERL, EOMI Neck: Trachea midline,  no masses, no thyromegal,y no JVD, no carotid bruit OROPHARYNX:  Moist, No exudate/ erythema/lesions.  Heart: Regular rate and rhythm, without murmurs, rubs, gallops, PMI non-displaced, no heaves or thrills on palpation.  Lungs: Clear to auscultation, no wheezing or rhonchi noted. No increased vocal fremitus resonant to percussion  Abdomen: Soft, nontender, nondistended, positive bowel sounds, no masses no hepatosplenomegaly noted..  Neuro: No focal neurological deficits noted cranial nerves II through XII grossly intact. DTRs 2+ bilaterally upper and lower extremities. Strength 5 out of 5 in bilateral upper and lower extremities. Musculoskeletal: No warm swelling or erythema around joints, no spinal tenderness noted. Psychiatric: Patient alert and oriented x3, good insight and cognition, good recent to remote recall. Lymph node survey: No cervical axillary or inguinal lymphadenopathy noted. Skin: Rt. Hip with wound with dime sized entry. No spontaneous drainage and no tenderness warmth or erythema around wound site.     Lab Results:  [facility] 03/06/11 0455 03/05/11 1256  NA 137 133*  K 4.1 4.9  CL 97 94*  CO2 34* 33*  GLUCOSE 139* 330*  BUN 28* 32*  CREATININE 0.79 0.85  CALCIUM 10.3 10.2  MG -- --  PHOS -- --   No results  found for this basename: AST:2,ALT:2,ALKPHOS:2,BILITOT:2,PROT:2,ALBUMIN:2 in the last 72 hours No results found for this basename: LIPASE:2,AMYLASE:2 in the last 72 hours  Basename 03/06/11 0455 03/05/11 0530  WBC 9.6 13.2*  NEUTROABS -- --  HGB 9.6* 11.4*  HCT 31.8* 35.8*  MCV 94.9 93.7  PLT 195 220   No results found for this basename: CKTOTAL:3,CKMB:3,CKMBINDEX:3,TROPONINI:3 in the last 72 hours No components found with this basename: POCBNP:3 No results found for this basename: DDIMER:2 in the last 72 hours No results found for this basename: HGBA1C:2 in the last 72 hours No results found for this basename: CHOL:2,HDL:2,LDLCALC:2,TRIG:2,CHOLHDL:2,LDLDIRECT:2 in the last 72 hours No results found for this basename: TSH,T4TOTAL,FREET3,T3FREE,THYROIDAB in the last 72 hours No results found for this basename: VITAMINB12:2,FOLATE:2,FERRITIN:2,TIBC:2,IRON:2,RETICCTPCT:2 in the last 72 hours  Micro Results: Recent Results (from the past 240 hour(s))  URINE CULTURE     Status: Normal   Collection Time   03/02/11  8:42 AM      Component Value Range Status Comment   Specimen Description URINE, RANDOM   Final    Special Requests NONE   Final    Setup Time [id number]   Final    Colony Count 85,000 COLONIES/ML   Final    Culture YEAST   Final    Report Status 03/03/2011 FINAL   Final   WOUND CULTURE     Status: Normal   Collection Time   03/03/11  1:23 PM      Component Value Range Status Comment   Specimen Description LEG RIGHT HIP WOUND   Final    Special Requests NONE   Final    Gram Stain     Final    Value: NO WBC SEEN     NO SQUAMOUS EPITHELIAL CELLS SEEN     NO ORGANISMS SEEN   Culture     Final    Value: MODERATE METHICILLIN RESISTANT STAPHYLOCOCCUS AUREUS     Note: RIFAMPIN AND GENTAMICIN SHOULD NOT BE USED AS SINGLE DRUGS FOR TREATMENT OF STAPH INFECTIONS. This organism DOES NOT demonstrate inducible Clindamycin resistance in vitro. CRITICAL RESULT CALLED TO, READ BACK BY  AND VERIFIED WITH: DENNIS LITTLE      03/06/11 0828 BY SMITHERSJ   Report Status 03/06/2011 FINAL   Final    Organism ID, Bacteria METHICILLIN RESISTANT STAPHYLOCOCCUS AUREUS   Final     Studies/Results: Dg Os Calcis Right  02/12/2011  *RADIOLOGY REPORT*  Clinical Data: Soft tissue wound.  Question osteomyelitis?  RIGHT OS CALCIS - 2+ VIEW  Comparison: 12/05/2003.  Findings: Soft tissue ulceration of the heel without plain film evidence of adjacent calcaneus osteomyelitis.  IMPRESSION: Soft tissue ulceration of the heel without plain film evidence of adjacent calcaneus osteomyelitis.  Original Report Authenticated By: Fuller Canada, M.D.   Ct Head Wo Contrast  03/01/2011  *RADIOLOGY REPORT*  Clinical Data: Altered mental status.  CT HEAD WITHOUT CONTRAST  Technique:  Contiguous axial images were obtained from the base of the skull through the vertex without contrast.  Comparison: CT 04/21/2008  Findings: Ventricle size is normal.  Negative for intracranial hemorrhage.  Negative for infarct or mass lesion.  No edema is present in the brain.  Defect in the parietal bone over the convexity may be related to prior surgery. There has been resorption of bone in this area since the prior CT of 04/21/2008.  IMPRESSION: No acute intracranial abnormality.  Original Report Authenticated By: Camelia Phenes, M.D.   Ct Ankle Right Wo Contrast  02/21/2011  *RADIOLOGY REPORT*  Clinical Data: Right ankle and hip wounds.  Evaluate for calcaneal osteomyelitis.  CT OF THE RIGHT ANKLE WITH CONTRAST  Technique:  Multidetector CT imaging was performed following the standard protocol during bolus administration of intravenous contrast.  Comparison: Radiographs 02/12/2011 and 02/18/2011.  Findings: The soft tissues posterior to the calcaneal tuberosity are partly excluded from the field of view.  As correlated with the recent radiographs, there is skin ulceration in this area with mild underlying edema in the subcutaneous  fat.  There is no focal fluid collection, soft tissue emphysema or foreign body.  No other inflammatory changes are identified.  There is diffuse muscular atrophy.  The ankle tendons appear intact.  There are small posterior and moderate plantar calcaneal spurs. There is no evidence of calcaneal bone destruction.  The subtalar joint appears normal.  The additional bones of the midfoot and hindfoot appear unremarkable.  IMPRESSION:  1.  No evidence of calcaneal osteomyelitis. 2.  Soft tissue wound posterior to the calcaneal tuberosity with mild underlying cellulitis.  No evidence of soft tissue abscess. 3.  Generalized muscular atrophy.  Original Report Authenticated By: Gerrianne Scale, M.D.   Ct Hip Right Wo Contrast  03/03/2011  *RADIOLOGY REPORT*  Clinical Data: Possible right hip infection.  Ulcer over the right hip.  CT OF THE RIGHT HIP WITHOUT CONTRAST  Technique:  Multidetector CT imaging was performed according to the  standard protocol. Multiplanar CT image reconstructions were also generated.  Comparison: CT pelvis 05/20/2008.  Findings: This study is limited by the patient's body habitus.  CT scan is not sensitive for the detection of osteomyelitis.  Given these limitations, no bony destructive change to suggest osteomyelitis is identified.  There is a soft tissue wound over the right greater trochanter extending into the deep subcutaneous tissues along the gluteus maximus.  No focal fluid collection is identified.  No right hip joint effusion is present.  The patient has a fat containing umbilical hernia. Also seen is a fatty lesion over the left aspect of the sacrum with some rim calcification.  Lesion measures approximately 5.2 cm transverse by 3.6 cm AP.  Imaged intrapelvic contents demonstrate a Foley catheter in place.  IMPRESSION:  1.  Soft tissue wound over the right hip.  No underlying evidence of abscess or osteomyelitis. 2.  Fat containing ventral hernia. 3.  There is calcification in  subcutaneous fat over the sacrum may be due to old trauma or fat necrosis.  Original Report Authenticated By: Bernadene Bell. Maricela Curet, M.D.   Dg Chest Port 1 View  03/05/2011  *RADIOLOGY REPORT*  Clinical Data: Pneumonia.  PORTABLE CHEST - 1 VIEW  Comparison: Multiple prior chest films.  Findings: The cardiac silhouette, mediastinal and hilar contours are prominent but unchanged.  There is persistent central vascular congestion and peribronchial thickening.  No focal airspace consolidation or definite pleural effusion.  No pneumothorax.  IMPRESSION: Stable chest x-ray findings.  Slightly lower lung volumes.  Original Report Authenticated By: P. Loralie Champagne, M.D.   Dg Chest Port 1 View  03/01/2011  *RADIOLOGY REPORT*  Clinical Data: Shortness of breath  PORTABLE CHEST - 1 VIEW  Comparison: Portable exam 1037 hours compared to 02/25/2011  Findings: Enlargement of cardiac silhouette. Slight pulmonary vascular congestion. Mediastinal contours normal. Minimal right basilar atelectasis. Lungs otherwise clear. No pleural effusion or pneumothorax.  IMPRESSION: Enlargement of cardiac silhouette with minimal right basilar atelectasis.  Original Report Authenticated By: Lollie Marrow, M.D.   Dg Chest Port 1 View  02/25/2011  *RADIOLOGY REPORT*  Clinical Data: Cough, shortness of breath  PORTABLE CHEST - 1 VIEW  Comparison: Chest x-ray of 06/25/2003  Findings: The lungs are not well aerated.  No focal infiltrate or effusion is seen.  Cardiomegaly is stable.  No bony abnormality is seen.  IMPRESSION:  Poor inspiration.  Stable cardiomegaly.  No definite active process.  Original Report Authenticated By: Juline Patch, M.D.   Dg Foot Complete Right  02/18/2011  *RADIOLOGY REPORT*  Clinical Data: Heel pressure open wound cellulitis.  Rule out osteomyelitis.  RIGHT FOOT COMPLETE - 3+ VIEW  Comparison: 02/12/2011  Findings: Bones appear osteopenic.  There is soft tissue defect in the heel, overlying the posterior aspect  of the calcaneus.  The soft tissue defect appears deeper than on the previous study.  In addition, there is question of cortical disruption adjacent to the soft tissue ulceration which can be seen and early osteomyelitis. Note is made of a plantar calcaneal spur.  There is soft tissue swelling of the forefoot.  No evidence for acute fracture.  IMPRESSION:  1.  Soft tissue ulceration appears more significant. 2.  Question of early osteomyelitis of the calcaneus.  Original Report Authenticated By: Patterson Hammersmith, M.D.    Medications: I have reviewed the patient's current medications. Scheduled Meds:   . albuterol  2.5 mg Nebulization TID  . benazepril  10 mg Oral Daily  .  citalopram  60 mg Oral Daily  . enoxaparin  100 mg Subcutaneous QHS  . fentaNYL  400 mcg Transdermal Q72H  . fluticasone  2 spray Each Nare Daily  . gabapentin  300 mg Oral QHS  . insulin aspart  0-20 Units Subcutaneous TID WC  . insulin glargine  85 Units Subcutaneous QHS  . ipratropium  0.5 mg Nebulization TID  . levothyroxine  112 mcg Oral Daily  . linagliptin  5 mg Oral Daily  . Liraglutide  1.8 mg Subcutaneous Daily  . loratadine  10 mg Oral BID  . metoCLOPramide  10 mg Oral Q8H  . nystatin   Topical BID  . polyethylene glycol  17 g Oral Daily  . pregabalin  75 mg Oral TID  . pseudoephedrine  120 mg Oral BID  . simvastatin  20 mg Oral Daily   Continuous Infusions:  PRN Meds:.acetaminophen, acetaminophen, albuterol, carisoprodol, diazepam, HYDROmorphone, ipratropium, oxyCODONE, oxyCODONE-acetaminophen, senna-docusate Assessment/Plan: Patient Active Hospital Problem List: Healing pressure ulcer stage III (02/18/2011)   Assessment: Heel wound being managed by ortho. Pt S/P debridement yesterday with continued wound vac.   Plan: will continue wound vac per orthopedic direction. Morbidly obese (02/18/2011)   Assessment: noted Diabetes mellitus type 2 with complications, uncontrolled (02/18/2011)   Assessment:  relatively well controlled. Chronic pain (02/18/2011)   Assessment: con't chronic pain meds Heel ulcer due to DM (02/19/2011)   Assessment: patient has completed Abx and is on wound vac.  Wound, open, hip or thigh (02/19/2011)   Assessment: see above    LOS: 16 days

## 2011-03-07 LAB — GLUCOSE, CAPILLARY
Glucose-Capillary: 172 mg/dL — ABNORMAL HIGH (ref 70–99)
Glucose-Capillary: 140 mg/dL — ABNORMAL HIGH (ref 70–99)
Glucose-Capillary: 205 mg/dL — ABNORMAL HIGH (ref 70–99)
Glucose-Capillary: 124 mg/dL — ABNORMAL HIGH (ref 70–99)
Glucose-Capillary: 170 mg/dL — ABNORMAL HIGH (ref 70–99)

## 2011-03-07 MED ORDER — LORATADINE 10 MG PO TABS
10.0000 mg | ORAL_TABLET | Freq: Every day | ORAL | Status: DC
  Administered 2011-03-08: 10 mg via ORAL
  Filled 2011-03-07 (×2): qty 1

## 2011-03-07 NOTE — Progress Notes (Signed)
[facility] FOLLOWING FOR WOUND VAC/DSG CHANGES R HEEL/& PRIOR TO ADMISSION-R HIP WOUND-WOUND VAC/DSG CHANGES PER [facility].HHPT ALSO RECOMMENDED.WILL NEED F2F,& HHRN/PT ORDERS.[facility]-DME FOLLOWING FOR W/C.PATIENT W/CONCERNS ABOUT COVERAGE OF W/C-CO PAY-[facility] WILL TALK TO PATIENT.AMBULANCE TRANSP NEEDED AT D/C.SW NOTIFIED.

## 2011-03-07 NOTE — Progress Notes (Signed)
Subjective:  Pain in Rt. Heel through since yesterday. Patient has no complaints except regarding her brace which she states that they'll function and is requesting that [employer] be called to evaluate the brace. Objective: Filed Vitals:   03/06/11 1400 03/06/11 1401 03/06/11 2130 03/07/11 1024  BP: 117/70  106/74   Pulse: 74  96   Temp: 98.4 F (36.9 C)  98.8 F (37.1 C)   TempSrc: Oral  Oral   Resp: 18  20   Height:      Weight:      SpO2: 99% 99% 94% 92%   Weight change:   Intake/Output Summary (Last 24 hours) at 03/07/11 1233 Last data filed at 03/07/11 1025  Gross per 24 hour  Intake    480 ml  Output    500 ml  Net    -20 ml    General:Morbidly obese female. Alert, awake, oriented x3, in no acute distress.  HEENT: [place]/AT PEERL, EOMI Neck: Trachea midline,  no masses, no thyromegal,y no JVD, no carotid bruit OROPHARYNX:  Moist, No exudate/ erythema/lesions.  Heart: Regular rate and rhythm, without murmurs, rubs, gallops, PMI non-displaced, no heaves or thrills on palpation.  Lungs: Clear to auscultation, no wheezing or rhonchi noted. No increased vocal fremitus resonant to percussion  Abdomen: Soft, nontender, nondistended, positive bowel sounds, no masses no hepatosplenomegaly noted..  Neuro: No focal neurological deficits noted cranial nerves II through XII grossly intact. DTRs 2+ bilaterally upper and lower extremities. Strength 5 out of 5 in bilateral upper and lower extremities. Musculoskeletal: No warm swelling or erythema around joints, no spinal tenderness noted. Psychiatric: Patient alert and oriented x3, good insight and cognition, good recent to remote recall. Lymph node survey: No cervical axillary or inguinal lymphadenopathy noted. Skin: Rt. Hip wound has no spontaneous drainage, tenderness,  warmth or erythema around wound site.    Lab Results:  [facility] 03/06/11 0455 03/05/11 1256  NA 137 133*  K 4.1 4.9  CL 97 94*  CO2 34* 33*  GLUCOSE 139* 330*  BUN  28* 32*  CREATININE 0.79 0.85  CALCIUM 10.3 10.2  MG -- --  PHOS -- --   No results found for this basename: AST:2,ALT:2,ALKPHOS:2,BILITOT:2,PROT:2,ALBUMIN:2 in the last 72 hours No results found for this basename: LIPASE:2,AMYLASE:2 in the last 72 hours  Basename 03/06/11 0455 03/05/11 0530  WBC 9.6 13.2*  NEUTROABS -- --  HGB 9.6* 11.4*  HCT 31.8* 35.8*  MCV 94.9 93.7  PLT 195 220   No results found for this basename: CKTOTAL:3,CKMB:3,CKMBINDEX:3,TROPONINI:3 in the last 72 hours No components found with this basename: POCBNP:3 No results found for this basename: DDIMER:2 in the last 72 hours No results found for this basename: HGBA1C:2 in the last 72 hours No results found for this basename: CHOL:2,HDL:2,LDLCALC:2,TRIG:2,CHOLHDL:2,LDLDIRECT:2 in the last 72 hours No results found for this basename: TSH,T4TOTAL,FREET3,T3FREE,THYROIDAB in the last 72 hours No results found for this basename: VITAMINB12:2,FOLATE:2,FERRITIN:2,TIBC:2,IRON:2,RETICCTPCT:2 in the last 72 hours  Micro Results: Recent Results (from the past 240 hour(s))  URINE CULTURE     Status: Normal   Collection Time   03/02/11  8:42 AM      Component Value Range Status Comment   Specimen Description URINE, RANDOM   Final    Special Requests NONE   Final    Setup Time [id number]   Final    Colony Count 85,000 COLONIES/ML   Final    Culture YEAST   Final    Report Status 03/03/2011 FINAL  Final   WOUND CULTURE     Status: Normal   Collection Time   03/03/11  1:23 PM      Component Value Range Status Comment   Specimen Description LEG RIGHT HIP WOUND   Final    Special Requests NONE   Final    Gram Stain     Final    Value: NO WBC SEEN     NO SQUAMOUS EPITHELIAL CELLS SEEN     NO ORGANISMS SEEN   Culture     Final    Value: MODERATE METHICILLIN RESISTANT STAPHYLOCOCCUS AUREUS     Note: RIFAMPIN AND GENTAMICIN SHOULD NOT BE USED AS SINGLE DRUGS FOR TREATMENT OF STAPH INFECTIONS. This organism DOES NOT  demonstrate inducible Clindamycin resistance in vitro. CRITICAL RESULT CALLED TO, READ BACK BY AND VERIFIED WITH: DENNIS LITTLE      03/06/11 0828 BY SMITHERSJ   Report Status 03/06/2011 FINAL   Final    Organism ID, Bacteria METHICILLIN RESISTANT STAPHYLOCOCCUS AUREUS   Final     Studies/Results: Dg Os Calcis Right  02/12/2011  *RADIOLOGY REPORT*  Clinical Data: Soft tissue wound.  Question osteomyelitis?  RIGHT OS CALCIS - 2+ VIEW  Comparison: 12/05/2003.  Findings: Soft tissue ulceration of the heel without plain film evidence of adjacent calcaneus osteomyelitis.  IMPRESSION: Soft tissue ulceration of the heel without plain film evidence of adjacent calcaneus osteomyelitis.  Original Report Authenticated By: Fuller Canada, M.D.   Ct Head Wo Contrast  03/01/2011  *RADIOLOGY REPORT*  Clinical Data: Altered mental status.  CT HEAD WITHOUT CONTRAST  Technique:  Contiguous axial images were obtained from the base of the skull through the vertex without contrast.  Comparison: CT 04/21/2008  Findings: Ventricle size is normal.  Negative for intracranial hemorrhage.  Negative for infarct or mass lesion.  No edema is present in the brain.  Defect in the parietal bone over the convexity may be related to prior surgery. There has been resorption of bone in this area since the prior CT of 04/21/2008.  IMPRESSION: No acute intracranial abnormality.  Original Report Authenticated By: Camelia Phenes, M.D.   Ct Ankle Right Wo Contrast  02/21/2011  *RADIOLOGY REPORT*  Clinical Data: Right ankle and hip wounds.  Evaluate for calcaneal osteomyelitis.  CT OF THE RIGHT ANKLE WITH CONTRAST  Technique:  Multidetector CT imaging was performed following the standard protocol during bolus administration of intravenous contrast.  Comparison: Radiographs 02/12/2011 and 02/18/2011.  Findings: The soft tissues posterior to the calcaneal tuberosity are partly excluded from the field of view.  As correlated with the recent  radiographs, there is skin ulceration in this area with mild underlying edema in the subcutaneous fat.  There is no focal fluid collection, soft tissue emphysema or foreign body.  No other inflammatory changes are identified.  There is diffuse muscular atrophy.  The ankle tendons appear intact.  There are small posterior and moderate plantar calcaneal spurs. There is no evidence of calcaneal bone destruction.  The subtalar joint appears normal.  The additional bones of the midfoot and hindfoot appear unremarkable.  IMPRESSION:  1.  No evidence of calcaneal osteomyelitis. 2.  Soft tissue wound posterior to the calcaneal tuberosity with mild underlying cellulitis.  No evidence of soft tissue abscess. 3.  Generalized muscular atrophy.  Original Report Authenticated By: Gerrianne Scale, M.D.   Ct Hip Right Wo Contrast  03/03/2011  *RADIOLOGY REPORT*  Clinical Data: Possible right hip infection.  Ulcer over the right hip.  CT OF THE RIGHT HIP WITHOUT CONTRAST  Technique:  Multidetector CT imaging was performed according to the standard protocol. Multiplanar CT image reconstructions were also generated.  Comparison: CT pelvis 05/20/2008.  Findings: This study is limited by the patient's body habitus.  CT scan is not sensitive for the detection of osteomyelitis.  Given these limitations, no bony destructive change to suggest osteomyelitis is identified.  There is a soft tissue wound over the right greater trochanter extending into the deep subcutaneous tissues along the gluteus maximus.  No focal fluid collection is identified.  No right hip joint effusion is present.  The patient has a fat containing umbilical hernia. Also seen is a fatty lesion over the left aspect of the sacrum with some rim calcification.  Lesion measures approximately 5.2 cm transverse by 3.6 cm AP.  Imaged intrapelvic contents demonstrate a Foley catheter in place.  IMPRESSION:  1.  Soft tissue wound over the right hip.  No underlying evidence  of abscess or osteomyelitis. 2.  Fat containing ventral hernia. 3.  There is calcification in subcutaneous fat over the sacrum may be due to old trauma or fat necrosis.  Original Report Authenticated By: Bernadene Bell. Maricela Curet, M.D.   Dg Chest Port 1 View  03/05/2011  *RADIOLOGY REPORT*  Clinical Data: Pneumonia.  PORTABLE CHEST - 1 VIEW  Comparison: Multiple prior chest films.  Findings: The cardiac silhouette, mediastinal and hilar contours are prominent but unchanged.  There is persistent central vascular congestion and peribronchial thickening.  No focal airspace consolidation or definite pleural effusion.  No pneumothorax.  IMPRESSION: Stable chest x-ray findings.  Slightly lower lung volumes.  Original Report Authenticated By: P. Loralie Champagne, M.D.   Dg Chest Port 1 View  03/01/2011  *RADIOLOGY REPORT*  Clinical Data: Shortness of breath  PORTABLE CHEST - 1 VIEW  Comparison: Portable exam 1037 hours compared to 02/25/2011  Findings: Enlargement of cardiac silhouette. Slight pulmonary vascular congestion. Mediastinal contours normal. Minimal right basilar atelectasis. Lungs otherwise clear. No pleural effusion or pneumothorax.  IMPRESSION: Enlargement of cardiac silhouette with minimal right basilar atelectasis.  Original Report Authenticated By: Lollie Marrow, M.D.   Dg Chest Port 1 View  02/25/2011  *RADIOLOGY REPORT*  Clinical Data: Cough, shortness of breath  PORTABLE CHEST - 1 VIEW  Comparison: Chest x-ray of 06/25/2003  Findings: The lungs are not well aerated.  No focal infiltrate or effusion is seen.  Cardiomegaly is stable.  No bony abnormality is seen.  IMPRESSION:  Poor inspiration.  Stable cardiomegaly.  No definite active process.  Original Report Authenticated By: Juline Patch, M.D.   Dg Foot Complete Right  02/18/2011  *RADIOLOGY REPORT*  Clinical Data: Heel pressure open wound cellulitis.  Rule out osteomyelitis.  RIGHT FOOT COMPLETE - 3+ VIEW  Comparison: 02/12/2011  Findings:  Bones appear osteopenic.  There is soft tissue defect in the heel, overlying the posterior aspect of the calcaneus.  The soft tissue defect appears deeper than on the previous study.  In addition, there is question of cortical disruption adjacent to the soft tissue ulceration which can be seen and early osteomyelitis. Note is made of a plantar calcaneal spur.  There is soft tissue swelling of the forefoot.  No evidence for acute fracture.  IMPRESSION:  1.  Soft tissue ulceration appears more significant. 2.  Question of early osteomyelitis of the calcaneus.  Original Report Authenticated By: Patterson Hammersmith, M.D.    Medications: I have reviewed the patient's current medications. Scheduled  Meds:    . albuterol  2.5 mg Nebulization TID  . benazepril  10 mg Oral Daily  . citalopram  60 mg Oral Daily  . enoxaparin  100 mg Subcutaneous QHS  . fentaNYL  400 mcg Transdermal Q72H  . fluticasone  2 spray Each Nare Daily  . gabapentin  300 mg Oral QHS  . insulin aspart  0-20 Units Subcutaneous TID WC  . insulin glargine  85 Units Subcutaneous QHS  . ipratropium  0.5 mg Nebulization TID  . levothyroxine  112 mcg Oral Daily  . linagliptin  5 mg Oral Daily  . Liraglutide  1.8 mg Subcutaneous Daily  . loratadine  10 mg Oral BID  . metoCLOPramide  10 mg Oral Q8H  . nystatin   Topical BID  . polyethylene glycol  17 g Oral Daily  . pregabalin  75 mg Oral TID  . pseudoephedrine  120 mg Oral BID  . simvastatin  20 mg Oral Daily   Continuous Infusions:  PRN Meds:.acetaminophen, acetaminophen, albuterol, carisoprodol, diazepam, HYDROmorphone, ipratropium, oxyCODONE, oxyCODONE-acetaminophen, senna-docusate Assessment/Plan: Patient Active Hospital Problem List: Healing pressure ulcer stage III (02/18/2011)   Assessment: Heel wound being managed by ortho. Pt S/P debridement yesterday with continued wound vac.   Plan: will continue wound vac per orthopedic direction. Morbidly obese (02/18/2011)    Assessment: noted Diabetes mellitus type 2 with complications, uncontrolled (02/18/2011)   Assessment: relatively well controlled. Chronic pain (02/18/2011)   Assessment: con't chronic pain meds Heel ulcer due to DM (02/19/2011)   Assessment: patient has completed Abx and is on wound vac.  Wound, open, hip or thigh (02/19/2011)   Assessment: see above  If patient continues to be afebrile today and has no increased drainage from the wound I plan to discharge her home tomorrow with home care services. Dr. Diamantina [place] office has been notified that the patient be discharged tomorrow as per his request.   LOS: 17 days

## 2011-03-07 NOTE — Progress Notes (Signed)
Physical Therapy Treatment Patient Details Name: [name] MRN: [id number] DOB: 01-09-1971 Today's Date: 03/07/2011 8:50 - 9:15 1 ta PT Assessment/Plan  PT - Assessment/Plan Comments on Treatment Session: Pt concerned about the NWB and that her home is too small for a wheelchair.  Pt hoping she will be more mobile once she receives her walking boot which will allow her to WB thru her forefoot vs her heel. PT Plan: Discharge plan remains appropriate PT Frequency: Min 3X/week Follow Up Recommendations: Home health PT PT Goals  Acute Rehab PT Goals PT Goal Formulation: With patient Pt will go Supine/Side to Sit: with modified independence PT Goal: Supine/Side to Sit - Progress: Progressing toward goal Pt will go Sit to Stand: with modified independence PT Goal: Sit to Stand - Progress: Progressing toward goal Pt will go Stand to Sit: with modified independence PT Goal: Stand to Sit - Progress: Progressing toward goal Pt will Ambulate: 1 - 15 feet;with supervision;with rolling walker PT Goal: Ambulate - Progress: Progressing toward goal Pt will Go Up / Down Stairs: with min assist;3-5 stairs PT Goal: Up/Down Stairs - Progress: Progressing toward goal Pt will Propel Wheelchair: 51 - 150 feet;Independently PT Goal: Propel Wheelchair - Progress: Progressing toward goal  PT Treatment Precautions/Restrictions  Precautions Precautions: Fall Precaution Comments: wound vac to R hip and heel with ankle splint to float heel Required Braces or Orthoses: Yes Other Brace/Splint: still waiting for [employer] to fit pt with a walking boot Restrictions Weight Bearing Restrictions: Yes Other Position/Activity Restrictions: NWB thru R heel Mobility (including Balance) Bed Mobility Bed Mobility: Yes Supine to Sit: 4: Min assist Supine to Sit Details (indicate cue type and reason): increased time and pt prefers HOB down, does use bed rails Sitting - Scoot to Edge of Bed: 5:  Supervision Sitting - Scoot to [place] of Bed Details (indicate cue type and reason): only required increased time Sit to Supine - Right: 5: Supervision Sit to Supine - Right Details (indicate cue type and reason): pt uses momentum and requires increased time Transfers Transfers: No Ambulation/Gait Ambulation/Gait: No (waiting for walking boot before attempting)   Pt sat EOB X10 min @ Mod Indep with initial c/o mild dizzyness (positional) which subsided   End of Session PT - End of Session Activity Tolerance: Patient tolerated treatment well Patient left: in bed;with call bell in reach General Behavior During Session: [facility] for tasks performed Cognition: [facility] for tasks performed  Felecia Shelling PTA [facility]  Acute  Rehab Pager     [phone]

## 2011-03-08 LAB — GLUCOSE, CAPILLARY
Glucose-Capillary: 104 mg/dL — ABNORMAL HIGH (ref 70–99)
Glucose-Capillary: 200 mg/dL — ABNORMAL HIGH (ref 70–99)
Glucose-Capillary: 125 mg/dL — ABNORMAL HIGH (ref 70–99)

## 2011-03-08 MED ORDER — NYSTATIN 100000 UNIT/GM EX POWD: 1.0000 g | Freq: Two times a day (BID) | CUTANEOUS | Status: DC

## 2011-03-08 NOTE — Progress Notes (Signed)
Discussed in the long length of stay meeting [name] Weeks 03/08/2011  

## 2011-03-08 NOTE — Progress Notes (Signed)
Patient stated she needed a bariatric bsc before she could be discharged home.  Contacted physician who ordered this and faxed the order to [facility].  CM on call involved with situation and spoke directly with [facility].  [facility] said they could not deliver [facility] until tomorrow.  Patient at that time refused to be discharged, says she can not walk on her foot and would have to lay in urine and feces without the BSC.  Contacted the physician who stated she could not let the patient stay just because she needed a BSC and that patient was discharged.  Contacted the [facility] who stated she would come and speak with patient regarding this matter.

## 2011-03-08 NOTE — Discharge Summary (Signed)
[name] MRN: [id number] DOB/AGE: Dec 25, 1970 40 y.o.  Admit date: 02/18/2011 Discharge date: 03/08/2011  Primary Care Physician:  Dalbert Mayotte, MD, MD   Discharge Diagnoses:   Patient Active Problem List  Diagnoses  . Healing pressure ulcer stage III  . Morbidly obese  . Diabetes mellitus type 2 with complications, uncontrolled  . Sleep apnea  . Hypothyroidism  . Chronic pain  . Acute lymphangitis  . Cellulitis of foot  . Heel ulcer due to DM  . Wound, open, hip or thigh  . Diabetic foot ulcer  . Lethargy  . Constipation  . URI (upper respiratory infection)    DISCHARGE MEDICATION: Current Discharge Medication List    START taking these medications   Details  nystatin (NYSTOP) 100000 UNIT/GM POWD Apply 1 g (100,000 Units total) topically 2 (two) times daily. Qty: 1 Bottle, Refills: 1      CONTINUE these medications which have NOT CHANGED   Details  atorvastatin (LIPITOR) 10 MG tablet Take 10 mg by mouth daily.      benazepril (LOTENSIN) 10 MG tablet Take 10 mg by mouth daily.      Biotin 5000 MCG CAPS Take 2 capsules by mouth 2 (two) times daily.      carisoprodol (SOMA) 350 MG tablet Take 350 mg by mouth 4 (four) times daily as needed.      celecoxib (CELEBREX) 400 MG capsule Take 400 mg by mouth 2 (two) times daily.      citalopram (CELEXA) 20 MG tablet Take 20 mg by mouth daily.      collagenase (SANTYL) ointment Apply 1 application topically daily.      Cyanocobalamin (VITAMIN B-12 IJ) Inject 100 Units as directed every 30 (thirty) days.      diazepam (VALIUM) 5 MG tablet Take 10 mg by mouth 2 (two) times daily as needed. Anxiety/sleep      esomeprazole (NEXIUM) 40 MG capsule Take 40 mg by mouth daily before breakfast.     fentaNYL (DURAGESIC - DOSED MCG/HR) 100 MCG/HR Place 4 patches onto the skin every 3 (three) days.      gabapentin (NEURONTIN) 300 MG capsule Take 300 mg by mouth at bedtime.      GARLIC PO Take 1 tablet by mouth daily.       HYDROmorphone (DILAUDID) 8 MG tablet Take 8 mg by mouth every 4 (four) hours as needed. Pain      insulin glargine (LANTUS) 100 UNIT/ML injection Inject 85 Units into the skin at bedtime.      Iron Combinations (IRON COMPLEX PO) Take 2 tablets by mouth 2 (two) times daily.      levothyroxine (SYNTHROID, LEVOTHROID) 112 MCG tablet Take 112 mcg by mouth daily.      Liraglutide (VICTOZA) 18 MG/3ML SOLN Inject 1.8 mg into the skin daily.      metoCLOPramide (REGLAN) 10 MG tablet Take 10 mg by mouth every 8 (eight) hours.      mometasone (NASONEX) 50 MCG/ACT nasal spray Place 2 sprays into the nose daily.      Multiple Vitamins-Minerals (MULTIVITAMINS THER. W/MINERALS) TABS Take 1 tablet by mouth daily.      oxyCODONE-acetaminophen (PERCOCET) 10-325 MG per tablet Take 1 tablet by mouth every 4 (four) hours. pain     pregabalin (LYRICA) 75 MG capsule Take 75 mg by mouth 3 (three) times daily.      sitaGLIPtan-metformin (JANUMET) 50-1000 MG per tablet Take 1 tablet by mouth 2 (two) times daily with a meal.  VITAMIN D, CHOLECALCIFEROL, PO Take 1 capsule by mouth daily.        STOP taking these medications     Pseudoephedrine-Ibuprofen (ADVIL COLD & SINUS LIQUI-GELS) 30-200 MG CAPS            Consults:     SIGNIFICANT DIAGNOSTIC STUDIES:  Dg Os Calcis Right  02/12/2011  *RADIOLOGY REPORT*  Clinical Data: Soft tissue wound.  Question osteomyelitis?  RIGHT OS CALCIS - 2+ VIEW  Comparison: 12/05/2003.  Findings: Soft tissue ulceration of the heel without plain film evidence of adjacent calcaneus osteomyelitis.  IMPRESSION: Soft tissue ulceration of the heel without plain film evidence of adjacent calcaneus osteomyelitis.  Original Report Authenticated By: Fuller Canada, M.D.   Ct Head Wo Contrast  03/01/2011  *RADIOLOGY REPORT*  Clinical Data: Altered mental status.  CT HEAD WITHOUT CONTRAST  Technique:  Contiguous axial images were obtained from the base of the skull  through the vertex without contrast.  Comparison: CT 04/21/2008  Findings: Ventricle size is normal.  Negative for intracranial hemorrhage.  Negative for infarct or mass lesion.  No edema is present in the brain.  Defect in the parietal bone over the convexity may be related to prior surgery. There has been resorption of bone in this area since the prior CT of 04/21/2008.  IMPRESSION: No acute intracranial abnormality.  Original Report Authenticated By: Camelia Phenes, M.D.   Ct Ankle Right Wo Contrast  02/21/2011  *RADIOLOGY REPORT*  Clinical Data: Right ankle and hip wounds.  Evaluate for calcaneal osteomyelitis.  CT OF THE RIGHT ANKLE WITH CONTRAST  Technique:  Multidetector CT imaging was performed following the standard protocol during bolus administration of intravenous contrast.  Comparison: Radiographs 02/12/2011 and 02/18/2011.  Findings: The soft tissues posterior to the calcaneal tuberosity are partly excluded from the field of view.  As correlated with the recent radiographs, there is skin ulceration in this area with mild underlying edema in the subcutaneous fat.  There is no focal fluid collection, soft tissue emphysema or foreign body.  No other inflammatory changes are identified.  There is diffuse muscular atrophy.  The ankle tendons appear intact.  There are small posterior and moderate plantar calcaneal spurs. There is no evidence of calcaneal bone destruction.  The subtalar joint appears normal.  The additional bones of the midfoot and hindfoot appear unremarkable.  IMPRESSION:  1.  No evidence of calcaneal osteomyelitis. 2.  Soft tissue wound posterior to the calcaneal tuberosity with mild underlying cellulitis.  No evidence of soft tissue abscess. 3.  Generalized muscular atrophy.  Original Report Authenticated By: Gerrianne Scale, M.D.   Ct Hip Right Wo Contrast  03/03/2011  *RADIOLOGY REPORT*  Clinical Data: Possible right hip infection.  Ulcer over the right hip.  CT OF THE RIGHT  HIP WITHOUT CONTRAST  Technique:  Multidetector CT imaging was performed according to the standard protocol. Multiplanar CT image reconstructions were also generated.  Comparison: CT pelvis 05/20/2008.  Findings: This study is limited by the patient's body habitus.  CT scan is not sensitive for the detection of osteomyelitis.  Given these limitations, no bony destructive change to suggest osteomyelitis is identified.  There is a soft tissue wound over the right greater trochanter extending into the deep subcutaneous tissues along the gluteus maximus.  No focal fluid collection is identified.  No right hip joint effusion is present.  The patient has a fat containing umbilical hernia. Also seen is a fatty lesion over the left aspect of  the sacrum with some rim calcification.  Lesion measures approximately 5.2 cm transverse by 3.6 cm AP.  Imaged intrapelvic contents demonstrate a Foley catheter in place.  IMPRESSION:  1.  Soft tissue wound over the right hip.  No underlying evidence of abscess or osteomyelitis. 2.  Fat containing ventral hernia. 3.  There is calcification in subcutaneous fat over the sacrum may be due to old trauma or fat necrosis.  Original Report Authenticated By: Bernadene Bell. Maricela Curet, M.D.   Dg Chest Port 1 View  03/05/2011  *RADIOLOGY REPORT*  Clinical Data: Pneumonia.  PORTABLE CHEST - 1 VIEW  Comparison: Multiple prior chest films.  Findings: The cardiac silhouette, mediastinal and hilar contours are prominent but unchanged.  There is persistent central vascular congestion and peribronchial thickening.  No focal airspace consolidation or definite pleural effusion.  No pneumothorax.  IMPRESSION: Stable chest x-ray findings.  Slightly lower lung volumes.  Original Report Authenticated By: P. Loralie Champagne, M.D.   Dg Chest Port 1 View  03/01/2011  *RADIOLOGY REPORT*  Clinical Data: Shortness of breath  PORTABLE CHEST - 1 VIEW  Comparison: Portable exam 1037 hours compared to 02/25/2011   Findings: Enlargement of cardiac silhouette. Slight pulmonary vascular congestion. Mediastinal contours normal. Minimal right basilar atelectasis. Lungs otherwise clear. No pleural effusion or pneumothorax.  IMPRESSION: Enlargement of cardiac silhouette with minimal right basilar atelectasis.  Original Report Authenticated By: Lollie Marrow, M.D.   Dg Chest Port 1 View  02/25/2011  *RADIOLOGY REPORT*  Clinical Data: Cough, shortness of breath  PORTABLE CHEST - 1 VIEW  Comparison: Chest x-ray of 06/25/2003  Findings: The lungs are not well aerated.  No focal infiltrate or effusion is seen.  Cardiomegaly is stable.  No bony abnormality is seen.  IMPRESSION:  Poor inspiration.  Stable cardiomegaly.  No definite active process.  Original Report Authenticated By: Juline Patch, M.D.   Dg Foot Complete Right  02/18/2011  *RADIOLOGY REPORT*  Clinical Data: Heel pressure open wound cellulitis.  Rule out osteomyelitis.  RIGHT FOOT COMPLETE - 3+ VIEW  Comparison: 02/12/2011  Findings: Bones appear osteopenic.  There is soft tissue defect in the heel, overlying the posterior aspect of the calcaneus.  The soft tissue defect appears deeper than on the previous study.  In addition, there is question of cortical disruption adjacent to the soft tissue ulceration which can be seen and early osteomyelitis. Note is made of a plantar calcaneal spur.  There is soft tissue swelling of the forefoot.  No evidence for acute fracture.  IMPRESSION:  1.  Soft tissue ulceration appears more significant. 2.  Question of early osteomyelitis of the calcaneus.  Original Report Authenticated By: Patterson Hammersmith, M.D.        Recent Results (from the past 240 hour(s))  URINE CULTURE     Status: Normal   Collection Time   03/02/11  8:42 AM      Component Value Range Status Comment   Specimen Description URINE, RANDOM   Final    Special Requests NONE   Final    Setup Time [id number]   Final    Colony Count 85,000 COLONIES/ML    Final    Culture YEAST   Final    Report Status 03/03/2011 FINAL   Final   WOUND CULTURE     Status: Normal   Collection Time   03/03/11  1:23 PM      Component Value Range Status Comment   Specimen Description LEG RIGHT HIP WOUND  Final    Special Requests NONE   Final    Gram Stain     Final    Value: NO WBC SEEN     NO SQUAMOUS EPITHELIAL CELLS SEEN     NO ORGANISMS SEEN   Culture     Final    Value: MODERATE METHICILLIN RESISTANT STAPHYLOCOCCUS AUREUS     Note: RIFAMPIN AND GENTAMICIN SHOULD NOT BE USED AS SINGLE DRUGS FOR TREATMENT OF STAPH INFECTIONS. This organism DOES NOT demonstrate inducible Clindamycin resistance in vitro. CRITICAL RESULT CALLED TO, READ BACK BY AND VERIFIED WITH: DENNIS LITTLE      03/06/11 0828 BY SMITHERSJ   Report Status 03/06/2011 FINAL   Final    Organism ID, Bacteria METHICILLIN RESISTANT STAPHYLOCOCCUS AUREUS   Final     BRIEF ADMITTING H & P: Ms. Cervantes is a 40 year old Caucasian female with multiple medical problems including morbid obesity, diabetes poorly controlled, right heel, and right hip poorly healing wounds, hypertension, and chronic pain syndrome. She had noticed that her right heel ulcer and wound was not responding getting worse over the last 3 days. She notices streaking marks going up her right leg from the heel wound. She has a wound VAC for her right hip wound and special special dressing and boot that she wears for her right heel wound. She is followed at the wound clinic, but states that she was supposed to have an appointment next week for a surgeon to look at her right hip wound.    Hospital Course:  Present on Admission:  .Healing pressure ulcer stage III: The patient has a stage III ulcer on her right hip which is managed by the wound care center. The patient has been having wound vacs placed on to the care of Dr. Kelly Splinter the patient will was continued on wound vac during this hospitalization will followup with Dr. Kelly Splinter as  previously arranged. The patient was initially started on IV antibiotics. Dr. Algis Liming from infectious diseases was consulted and felt that the patient required only 2 weeks of antibiotics from the last incision and drainage. This was completed during this hospitalization. Of note the topical a swab from the wound looked MRSA infection. I discussed with Dr. Sabino Gasser of infectious diseases and he agreed that no further antibiotics are necessary.the patient is being discharged without any further antibiotics at this time and continue wound VAC therapy by the gingiva home care under the care of Dr. Kelly Splinter.  .Morbidly obese: Noted   .Diabetes mellitus type 2 with complications, uncontrolled: Although not optimally controlled, blood sugars were reasonable here during hospital care.   .Acute lymphangitis: Chronic   .Cellulitis of foot: Patient has completed the IV antibiotics and at this point requires no further antibiotics from the perspective of orthopedic surgery or infectious diseases.  Marland KitchenHeel ulcer due to DM: See above   .Chronic pain:  Continue chronic pain medications   .Wound, open, hip or thigh: Continue wound VAC    Disposition and Follow-up:  Patient to followup with her primary care physician Dr. Ilsa Iha in one week. Patient also to followup with Dr. Berna Spare due to in the office in one week for the directions of Dr. August Saucer orthopedic surgery. The patient will also be followed by home care nursing to resume therapy for wound VAC and also home care physical therapy. Discharge Orders    Future Appointments: Provider: Department: Dept Phone: Center:   03/29/2011 11:15 AM Barbaraann Share, MD [facility] [phone] None  DISCHARGE EXAM:  General:Morbidly obese female. Alert, awake, oriented x3, in no acute distress.  HEENT: [place]/AT PEERL, EOMI  Blood pressure 92/59, pulse 79, temperature 97.9 F (36.6 C), temperature source Oral, resp. rate 20, height 5\' 5"  (1.651 m), weight 204.572 kg  (451 lb), SpO2 95.00%. Neck: Trachea midline, no masses, no thyromegal,y no JVD, no carotid bruit  OROPHARYNX: Moist, No exudate/ erythema/lesions.  Heart: Regular rate and rhythm, without murmurs, rubs, gallops, PMI non-displaced, no heaves or thrills on palpation.  Lungs: Clear to auscultation, no wheezing or rhonchi noted. No increased vocal fremitus resonant to percussion  Abdomen: Soft, nontender, nondistended, positive bowel sounds, no masses no hepatosplenomegaly noted..  Neuro: No focal neurological deficits noted cranial nerves II through XII grossly intact. DTRs 2+ bilaterally upper and lower extremities. Strength 5 out of 5 in bilateral upper and lower extremities.  Musculoskeletal: No warm swelling or erythema around joints, no spinal tenderness noted.  Psychiatric: Patient alert and oriented x3, good insight and cognition, good recent to remote recall.  Lymph node survey: No cervical axillary or inguinal lymphadenopathy noted.  Skin: Rt. Hip wound has no spontaneous drainage, tenderness, warmth or erythema around wound site.   Basename 03/06/11 0455  NA 137  K 4.1  CL 97  CO2 34*  GLUCOSE 139*  BUN 28*  CREATININE 0.79  CALCIUM 10.3  MG --  PHOS --   No results found for this basename: AST:2,ALT:2,ALKPHOS:2,BILITOT:2,PROT:2,ALBUMIN:2 in the last 72 hours No results found for this basename: LIPASE:2,AMYLASE:2 in the last 72 hours  Basename 03/06/11 0455  WBC 9.6  NEUTROABS --  HGB 9.6*  HCT 31.8*  MCV 94.9  PLT 195   Total time for discharge including face-to-face time approximately one hour Signed: MATTHEWS,MICHELLE A. 03/08/2011, 3:18 PM

## 2011-03-08 NOTE — Progress Notes (Signed)
Physical Therapy Treatment Patient Details Name: [name] MRN: [id number] DOB: [birth year] Today's Date: 03/08/2011 13:30  Pt declined PT stating she did not sleep well last night and that she is suppose to go home today.  Pt would like to see her Orth MD before she leaves as she has concerns about her new boot.  Pt declined to attempt any OOB act and standing act until she see's Dr August Saucer.  Reported to charge nurse.  Felecia Shelling PTA [facility]  Acute  Rehab Pager     [phone]

## 2011-03-08 NOTE — Progress Notes (Signed)
Pt stable Heel wound improved on right F/u 7 days in clinic [place] for skin graft

## 2011-03-27 ENCOUNTER — Other Ambulatory Visit (HOSPITAL_COMMUNITY): Payer: Self-pay | Admitting: Orthopaedic Surgery

## 2011-03-28 ENCOUNTER — Encounter: Payer: Self-pay | Admitting: Pulmonary Disease

## 2011-03-29 ENCOUNTER — Institutional Professional Consult (permissible substitution): Payer: Medicare Other | Admitting: Pulmonary Disease

## 2011-04-01 ENCOUNTER — Encounter (HOSPITAL_COMMUNITY): Payer: Self-pay | Admitting: *Deleted

## 2011-04-01 MED ORDER — CEFAZOLIN SODIUM-DEXTROSE 2-3 GM-% IV SOLR
2.0000 g | INTRAVENOUS | Status: AC
  Administered 2011-04-02: 2 g via INTRAVENOUS
  Filled 2011-04-01: qty 50

## 2011-04-02 ENCOUNTER — Encounter (HOSPITAL_COMMUNITY)
Admission: RE | Disposition: A | Discharge status: Home Health | Payer: Self-pay | Source: Ambulatory Visit | Attending: Orthopaedic Surgery

## 2011-04-02 ENCOUNTER — Ambulatory Visit (HOSPITAL_COMMUNITY): Payer: Medicare Other

## 2011-04-02 ENCOUNTER — Inpatient Hospital Stay (HOSPITAL_COMMUNITY)
Admission: RE | Admit: 2011-04-02 | Discharge: 2011-04-05 | DRG: 629 | Disposition: A | Discharge status: Home Health | Payer: Medicare Other | Source: Ambulatory Visit | Attending: Orthopaedic Surgery | Admitting: Orthopaedic Surgery

## 2011-04-02 ENCOUNTER — Ambulatory Visit (HOSPITAL_COMMUNITY): Payer: Medicare Other | Admitting: Anesthesiology

## 2011-04-02 ENCOUNTER — Encounter (HOSPITAL_COMMUNITY): Payer: Self-pay | Admitting: Anesthesiology

## 2011-04-02 DIAGNOSIS — Z79899 Other long term (current) drug therapy: Secondary | ICD-10-CM

## 2011-04-02 DIAGNOSIS — L97409 Non-pressure chronic ulcer of unspecified heel and midfoot with unspecified severity: Secondary | ICD-10-CM

## 2011-04-02 DIAGNOSIS — L988 Other specified disorders of the skin and subcutaneous tissue: Secondary | ICD-10-CM | POA: Diagnosis present

## 2011-04-02 DIAGNOSIS — I10 Essential (primary) hypertension: Secondary | ICD-10-CM | POA: Diagnosis present

## 2011-04-02 DIAGNOSIS — L03119 Cellulitis of unspecified part of limb: Secondary | ICD-10-CM

## 2011-04-02 DIAGNOSIS — E1165 Type 2 diabetes mellitus with hyperglycemia: Secondary | ICD-10-CM

## 2011-04-02 DIAGNOSIS — K219 Gastro-esophageal reflux disease without esophagitis: Secondary | ICD-10-CM | POA: Diagnosis present

## 2011-04-02 DIAGNOSIS — E1169 Type 2 diabetes mellitus with other specified complication: Principal | ICD-10-CM | POA: Diagnosis present

## 2011-04-02 DIAGNOSIS — E662 Morbid (severe) obesity with alveolar hypoventilation: Secondary | ICD-10-CM | POA: Diagnosis present

## 2011-04-02 DIAGNOSIS — J45909 Unspecified asthma, uncomplicated: Secondary | ICD-10-CM | POA: Diagnosis present

## 2011-04-02 DIAGNOSIS — F3289 Other specified depressive episodes: Secondary | ICD-10-CM | POA: Diagnosis present

## 2011-04-02 DIAGNOSIS — E039 Hypothyroidism, unspecified: Secondary | ICD-10-CM | POA: Diagnosis present

## 2011-04-02 DIAGNOSIS — Z794 Long term (current) use of insulin: Secondary | ICD-10-CM

## 2011-04-02 DIAGNOSIS — I739 Peripheral vascular disease, unspecified: Secondary | ICD-10-CM | POA: Diagnosis present

## 2011-04-02 DIAGNOSIS — M1990 Unspecified osteoarthritis, unspecified site: Secondary | ICD-10-CM | POA: Diagnosis present

## 2011-04-02 DIAGNOSIS — IMO0001 Reserved for inherently not codable concepts without codable children: Secondary | ICD-10-CM | POA: Diagnosis present

## 2011-04-02 DIAGNOSIS — L0391 Acute lymphangitis, unspecified: Secondary | ICD-10-CM

## 2011-04-02 DIAGNOSIS — G8929 Other chronic pain: Secondary | ICD-10-CM

## 2011-04-02 DIAGNOSIS — IMO0002 Reserved for concepts with insufficient information to code with codable children: Secondary | ICD-10-CM

## 2011-04-02 DIAGNOSIS — Z6841 Body Mass Index (BMI) 40.0 and over, adult: Secondary | ICD-10-CM

## 2011-04-02 DIAGNOSIS — G4730 Sleep apnea, unspecified: Secondary | ICD-10-CM

## 2011-04-02 DIAGNOSIS — J069 Acute upper respiratory infection, unspecified: Secondary | ICD-10-CM

## 2011-04-02 DIAGNOSIS — E11621 Type 2 diabetes mellitus with foot ulcer: Secondary | ICD-10-CM

## 2011-04-02 DIAGNOSIS — L8993 Pressure ulcer of unspecified site, stage 3: Secondary | ICD-10-CM

## 2011-04-02 DIAGNOSIS — F329 Major depressive disorder, single episode, unspecified: Secondary | ICD-10-CM | POA: Diagnosis present

## 2011-04-02 DIAGNOSIS — R5383 Other fatigue: Secondary | ICD-10-CM

## 2011-04-02 DIAGNOSIS — M216X9 Other acquired deformities of unspecified foot: Secondary | ICD-10-CM | POA: Diagnosis present

## 2011-04-02 HISTORY — DX: Gastro-esophageal reflux disease without esophagitis: K21.9

## 2011-04-02 HISTORY — DX: Chronic pain syndrome: G89.4

## 2011-04-02 HISTORY — DX: Nausea with vomiting, unspecified: R11.2

## 2011-04-02 HISTORY — DX: Shortness of breath: R06.02

## 2011-04-02 HISTORY — DX: Non-pressure chronic ulcer of unspecified heel and midfoot with unspecified severity: L97.409

## 2011-04-02 HISTORY — PX: I & D EXTREMITY: SHX5045

## 2011-04-02 HISTORY — DX: Other injury of unspecified body region, initial encounter: T14.8XXA

## 2011-04-02 HISTORY — DX: Cardiac murmur, unspecified: R01.1

## 2011-04-02 HISTORY — DX: Other specified postprocedural states: Z98.890

## 2011-04-02 HISTORY — DX: Peripheral vascular disease, unspecified: I73.9

## 2011-04-02 LAB — GLUCOSE, CAPILLARY
Glucose-Capillary: 167 mg/dL — ABNORMAL HIGH (ref 70–99)
Glucose-Capillary: 155 mg/dL — ABNORMAL HIGH (ref 70–99)
Glucose-Capillary: 109 mg/dL — ABNORMAL HIGH (ref 70–99)
Glucose-Capillary: 150 mg/dL — ABNORMAL HIGH (ref 70–99)

## 2011-04-02 LAB — CBC
HCT: 37.9 % (ref 36.0–46.0)
Hemoglobin: 12.2 g/dL (ref 12.0–15.0)
MCH: 29.4 pg (ref 26.0–34.0)
MCHC: 32.2 g/dL (ref 30.0–36.0)
MCV: 91.3 fL (ref 78.0–100.0)
Platelets: 198 10*3/uL (ref 150–400)
RBC: 4.15 MIL/uL (ref 3.87–5.11)
RDW: 13.4 % (ref 11.5–15.5)
WBC: 12.9 10*3/uL — ABNORMAL HIGH (ref 4.0–10.5)

## 2011-04-02 LAB — BASIC METABOLIC PANEL
BUN: 18 mg/dL (ref 6–23)
CO2: 26 mEq/L (ref 19–32)
Calcium: 9.2 mg/dL (ref 8.4–10.5)
Chloride: 100 mEq/L (ref 96–112)
Creatinine, Ser: 0.89 mg/dL (ref 0.50–1.10)
GFR calc Af Amer: >90 mL/min (ref >90)
GFR calc non Af Amer: 79 mL/min — ABNORMAL LOW (ref >90)
Glucose, Bld: 181 mg/dL — ABNORMAL HIGH (ref 70–99)
Potassium: 3.9 mEq/L (ref 3.5–5.1)
Sodium: 138 mEq/L (ref 135–145)

## 2011-04-02 LAB — SURGICAL PCR SCREEN
MRSA, PCR: NEGATIVE
Staphylococcus aureus: NEGATIVE

## 2011-04-02 LAB — HCG, SERUM, QUALITATIVE: Preg, Serum: NEGATIVE

## 2011-04-02 IMAGING — CR DG CHEST 2V
2 series · 2 of 2 positions shown · non-contrast
Comparison: [DATE]

CLINICAL DATA: Preop evaluation

CHEST - 2 VIEW

[view not recorded (1 of 2)]
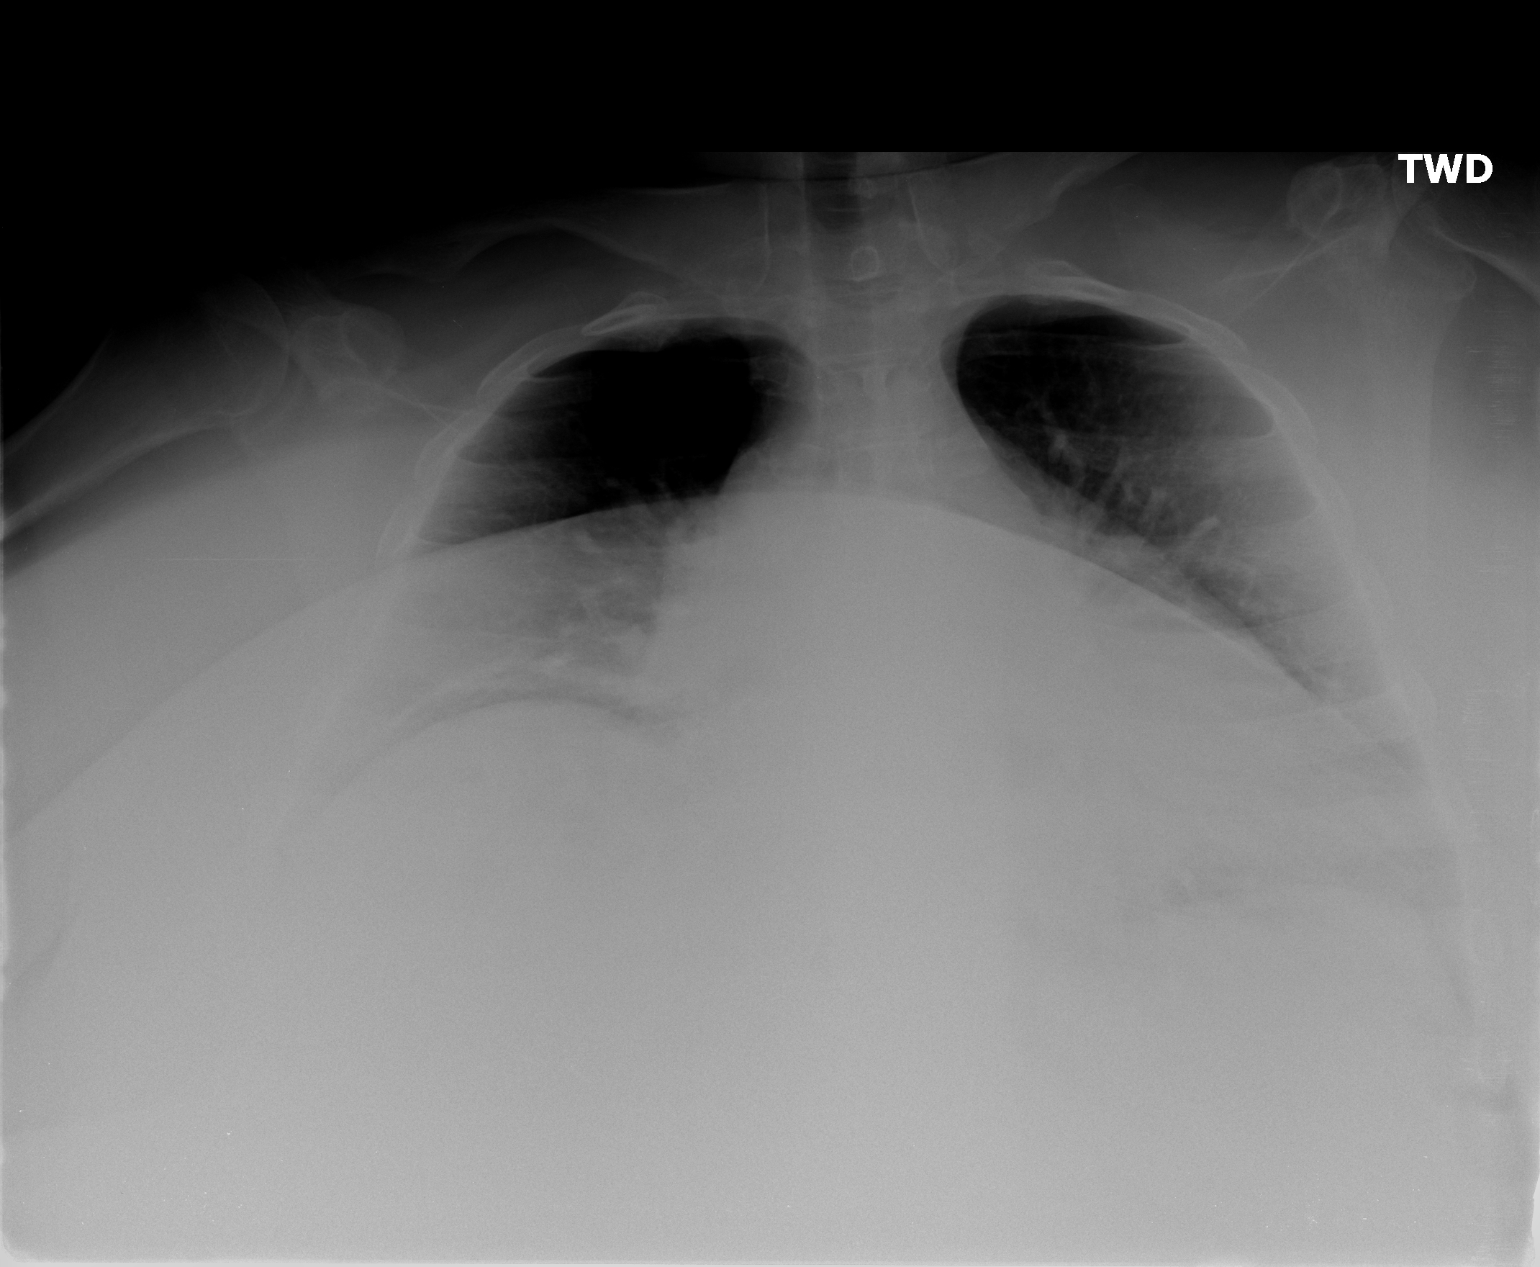

[view not recorded (2 of 2)]
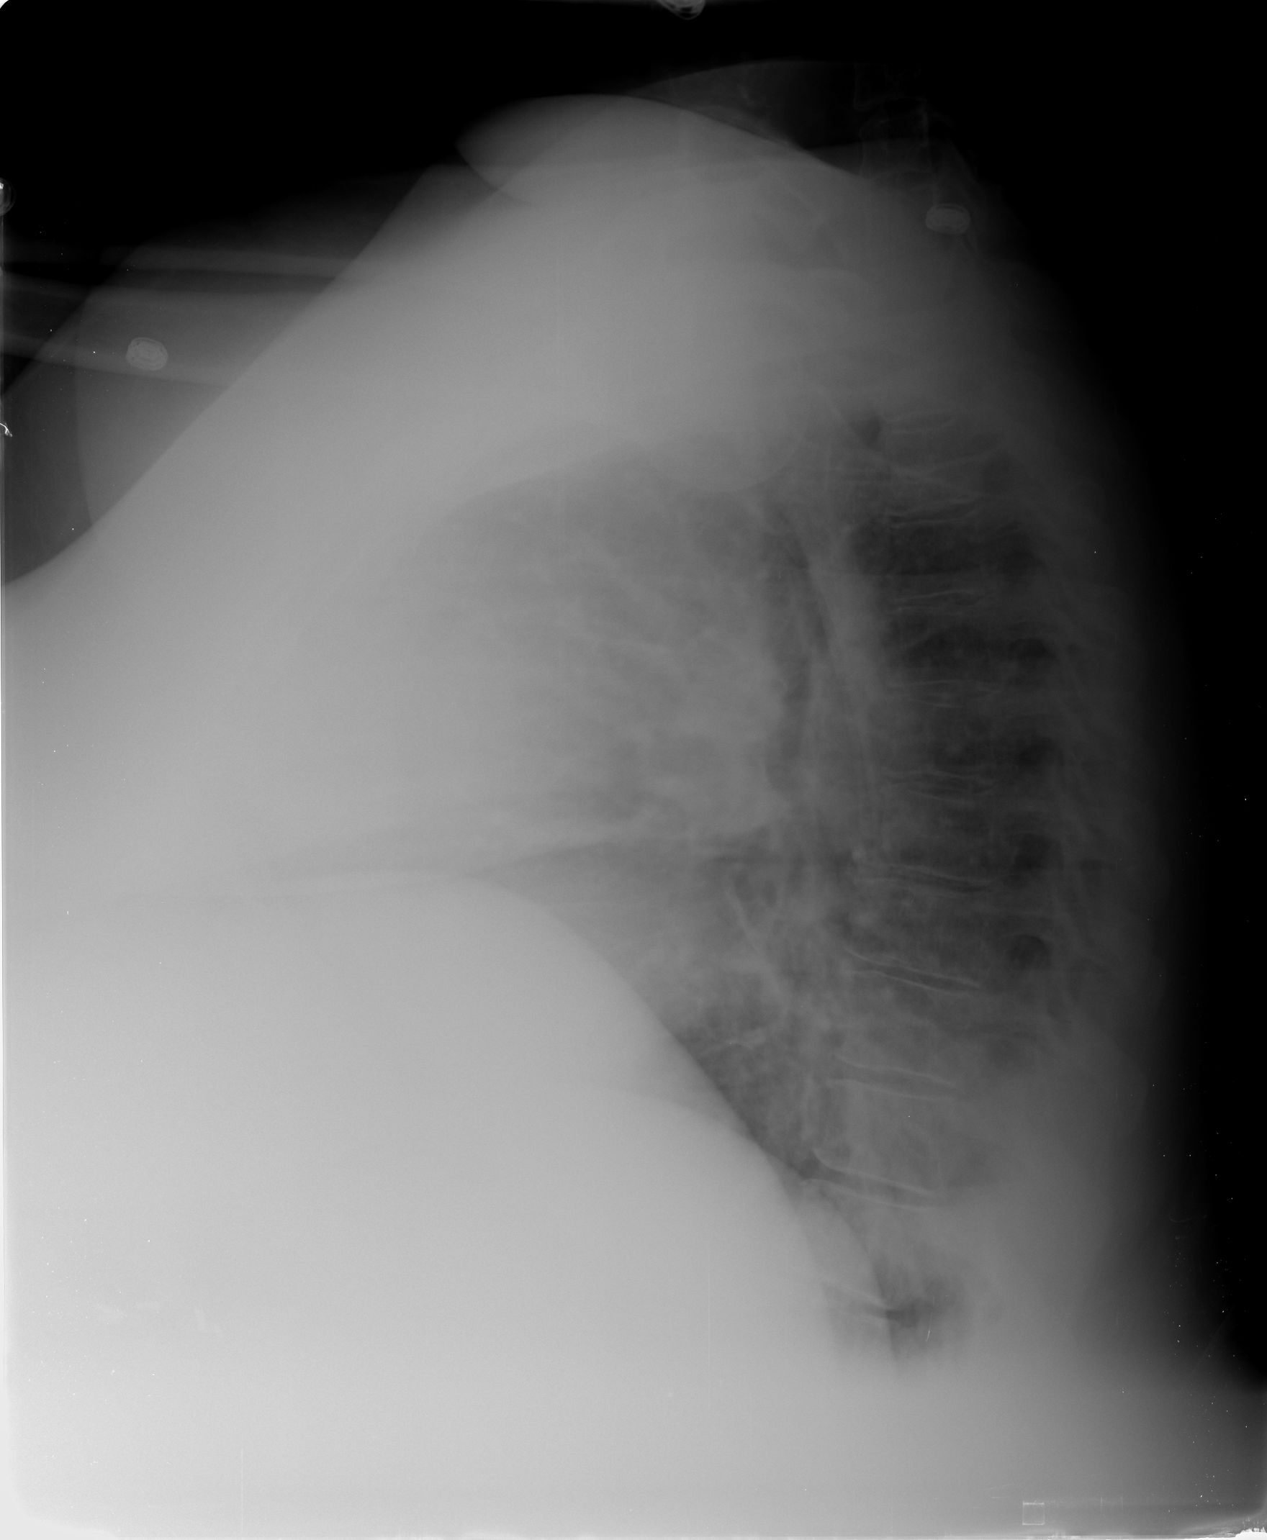

[2 of 2 positions shown; findings below may reference images not displayed]

FINDINGS: The patient is morbidly obese degrading image quality due
to large patient size.  In addition,   the AP view is significantly
lordotic.

Bibasilar airspace disease is present which is probably
atelectasis.  Negative for heart failure.
IMPRESSION: Study is significantly limited by morbid obesity.

Bibasilar airspace disease, most likely atelectasis.

## 2011-04-02 SURGERY — IRRIGATION AND DEBRIDEMENT EXTREMITY
Anesthesia: General | Site: Foot | Laterality: Right | Wound class: Contaminated

## 2011-04-02 MED ORDER — SITAGLIPTIN PHOS-METFORMIN HCL 50-1000 MG PO TABS: 1.0000 | ORAL_TABLET | Freq: Two times a day (BID) | ORAL | Status: DC

## 2011-04-02 MED ORDER — OXYCODONE-ACETAMINOPHEN 10-325 MG PO TABS: 2.0000 | ORAL_TABLET | ORAL | Status: DC

## 2011-04-02 MED ORDER — METFORMIN HCL 500 MG PO TABS
1000.0000 mg | ORAL_TABLET | Freq: Two times a day (BID) | ORAL | Status: DC
  Administered 2011-04-03 – 2011-04-05 (×6): 1000 mg via ORAL
  Filled 2011-04-02 (×8): qty 2

## 2011-04-02 MED ORDER — FENTANYL CITRATE 0.05 MG/ML IJ SOLN
INTRAMUSCULAR | Status: AC
  Filled 2011-04-02: qty 2

## 2011-04-02 MED ORDER — MORPHINE SULFATE 2 MG/ML IJ SOLN: 0.0500 mg/kg | INTRAMUSCULAR | Status: DC | PRN

## 2011-04-02 MED ORDER — LACTATED RINGERS IV SOLN
INTRAVENOUS | Status: DC | PRN
  Administered 2011-04-02: 15:00:00 via INTRAVENOUS

## 2011-04-02 MED ORDER — ONDANSETRON HCL 4 MG/2ML IJ SOLN: 4.0000 mg | Freq: Four times a day (QID) | INTRAMUSCULAR | Status: DC | PRN

## 2011-04-02 MED ORDER — GABAPENTIN 300 MG PO CAPS
300.0000 mg | ORAL_CAPSULE | Freq: Every day | ORAL | Status: DC
  Administered 2011-04-02 – 2011-04-04 (×3): 300 mg via ORAL
  Filled 2011-04-02 (×4): qty 1

## 2011-04-02 MED ORDER — METOCLOPRAMIDE HCL 10 MG PO TABS
5.0000 mg | ORAL_TABLET | Freq: Three times a day (TID) | ORAL | Status: DC | PRN
Start: 2011-04-02 — End: 2011-04-05
  Administered 2011-04-05: 10 mg via ORAL

## 2011-04-02 MED ORDER — ZOLPIDEM TARTRATE 5 MG PO TABS
5.0000 mg | ORAL_TABLET | Freq: Every evening | ORAL | Status: DC | PRN
  Filled 2011-04-02: qty 1

## 2011-04-02 MED ORDER — CIPROFLOXACIN HCL 500 MG PO TABS
500.0000 mg | ORAL_TABLET | Freq: Two times a day (BID) | ORAL | Status: DC
  Administered 2011-04-02 – 2011-04-05 (×6): 500 mg via ORAL
  Filled 2011-04-02 (×7): qty 1

## 2011-04-02 MED ORDER — PREGABALIN 75 MG PO CAPS
75.0000 mg | ORAL_CAPSULE | Freq: Three times a day (TID) | ORAL | Status: DC
  Administered 2011-04-02 – 2011-04-05 (×9): 75 mg via ORAL
  Filled 2011-04-02 (×9): qty 1

## 2011-04-02 MED ORDER — BENAZEPRIL HCL 10 MG PO TABS
10.0000 mg | ORAL_TABLET | Freq: Every day | ORAL | Status: DC
  Administered 2011-04-02 – 2011-04-05 (×4): 10 mg via ORAL
  Filled 2011-04-02 (×4): qty 1

## 2011-04-02 MED ORDER — CELECOXIB 200 MG PO CAPS
400.0000 mg | ORAL_CAPSULE | Freq: Two times a day (BID) | ORAL | Status: DC
  Administered 2011-04-02 – 2011-04-05 (×6): 400 mg via ORAL
  Filled 2011-04-02 (×7): qty 2

## 2011-04-02 MED ORDER — CARISOPRODOL 350 MG PO TABS
350.0000 mg | ORAL_TABLET | Freq: Four times a day (QID) | ORAL | Status: DC | PRN
  Administered 2011-04-02 – 2011-04-05 (×5): 350 mg via ORAL
  Filled 2011-04-02 (×5): qty 1

## 2011-04-02 MED ORDER — LIRAGLUTIDE 18 MG/3ML ~~LOC~~ SOLN
1.8000 mg | Freq: Every day | SUBCUTANEOUS | Status: DC
  Administered 2011-04-03 – 2011-04-04 (×2): 1.8 mg via SUBCUTANEOUS

## 2011-04-02 MED ORDER — MORPHINE SULFATE 2 MG/ML IJ SOLN
1.0000 mg | INTRAMUSCULAR | Status: DC | PRN
  Administered 2011-04-04 – 2011-04-05 (×2): 1 mg via INTRAVENOUS
  Filled 2011-04-02: qty 1

## 2011-04-02 MED ORDER — MUPIROCIN 2 % EX OINT
TOPICAL_OINTMENT | CUTANEOUS | Status: AC
  Administered 2011-04-02: 1 via NASAL
  Filled 2011-04-02: qty 22

## 2011-04-02 MED ORDER — PANTOPRAZOLE SODIUM 40 MG PO TBEC
40.0000 mg | DELAYED_RELEASE_TABLET | Freq: Every day | ORAL | Status: DC
  Administered 2011-04-02 – 2011-04-05 (×4): 40 mg via ORAL
  Filled 2011-04-02 (×4): qty 1

## 2011-04-02 MED ORDER — HYDROMORPHONE HCL 2 MG PO TABS
8.0000 mg | ORAL_TABLET | ORAL | Status: DC | PRN
  Administered 2011-04-02: 8 mg via ORAL
  Filled 2011-04-02 (×2): qty 4

## 2011-04-02 MED ORDER — NYSTATIN 100000 UNIT/GM EX POWD
1.0000 g | Freq: Two times a day (BID) | CUTANEOUS | Status: DC
  Administered 2011-04-02 – 2011-04-05 (×6): 100000 [IU] via TOPICAL
  Filled 2011-04-02 (×2): qty 15

## 2011-04-02 MED ORDER — FENTANYL CITRATE 0.05 MG/ML IJ SOLN
INTRAMUSCULAR | Status: DC | PRN
  Administered 2011-04-02: 100 ug via INTRAVENOUS
  Administered 2011-04-02: 25 ug via INTRAVENOUS
  Administered 2011-04-02: 50 ug via INTRAVENOUS

## 2011-04-02 MED ORDER — ONDANSETRON HCL 4 MG/2ML IJ SOLN
INTRAMUSCULAR | Status: DC | PRN
  Administered 2011-04-02: 4 mg via INTRAVENOUS

## 2011-04-02 MED ORDER — LEVOTHYROXINE SODIUM 112 MCG PO TABS
112.0000 ug | ORAL_TABLET | Freq: Every day | ORAL | Status: DC
  Administered 2011-04-03 – 2011-04-05 (×3): 112 ug via ORAL
  Filled 2011-04-02 (×4): qty 1

## 2011-04-02 MED ORDER — METOCLOPRAMIDE HCL 10 MG PO TABS
10.0000 mg | ORAL_TABLET | Freq: Three times a day (TID) | ORAL | Status: DC
  Administered 2011-04-02 – 2011-04-05 (×8): 10 mg via ORAL
  Filled 2011-04-02 (×12): qty 1

## 2011-04-02 MED ORDER — OXYCODONE HCL 5 MG PO TABS
10.0000 mg | ORAL_TABLET | ORAL | Status: DC
  Administered 2011-04-02 – 2011-04-05 (×16): 10 mg via ORAL
  Filled 2011-04-02 (×4): qty 2
  Filled 2011-04-02: qty 1
  Filled 2011-04-02 (×7): qty 2
  Filled 2011-04-02: qty 1
  Filled 2011-04-02 (×5): qty 2

## 2011-04-02 MED ORDER — OXYCODONE-ACETAMINOPHEN 5-325 MG PO TABS
2.0000 | ORAL_TABLET | ORAL | Status: DC
  Administered 2011-04-02 – 2011-04-05 (×17): 2 via ORAL
  Filled 2011-04-02 (×17): qty 2

## 2011-04-02 MED ORDER — LACTATED RINGERS IV SOLN
INTRAVENOUS | Status: DC
  Administered 2011-04-02: 15:00:00 via INTRAVENOUS

## 2011-04-02 MED ORDER — FLUTICASONE PROPIONATE 50 MCG/ACT NA SUSP
1.0000 | Freq: Every day | NASAL | Status: DC
  Administered 2011-04-03 – 2011-04-05 (×3): 1 via NASAL
  Filled 2011-04-02: qty 16

## 2011-04-02 MED ORDER — PROPOFOL 10 MG/ML IV EMUL
INTRAVENOUS | Status: DC | PRN
  Administered 2011-04-02: 300 mg via INTRAVENOUS

## 2011-04-02 MED ORDER — LINAGLIPTIN 5 MG PO TABS
5.0000 mg | ORAL_TABLET | Freq: Two times a day (BID) | ORAL | Status: DC
  Administered 2011-04-03 – 2011-04-05 (×6): 5 mg via ORAL
  Filled 2011-04-02 (×8): qty 1

## 2011-04-02 MED ORDER — DIPHENHYDRAMINE HCL 12.5 MG/5ML PO ELIX
12.5000 mg | ORAL_SOLUTION | ORAL | Status: DC | PRN
Start: 2011-04-02 — End: 2011-04-05
  Filled 2011-04-02: qty 10

## 2011-04-02 MED ORDER — METOCLOPRAMIDE HCL 5 MG/ML IJ SOLN
5.0000 mg | Freq: Three times a day (TID) | INTRAMUSCULAR | Status: DC | PRN
  Filled 2011-04-02: qty 2

## 2011-04-02 MED ORDER — ONDANSETRON HCL 4 MG PO TABS: 4.0000 mg | ORAL_TABLET | Freq: Four times a day (QID) | ORAL | Status: DC | PRN

## 2011-04-02 MED ORDER — LACTATED RINGERS IV SOLN: INTRAVENOUS | Status: DC

## 2011-04-02 MED ORDER — SUCCINYLCHOLINE CHLORIDE 20 MG/ML IJ SOLN
INTRAMUSCULAR | Status: DC | PRN
  Administered 2011-04-02: 140 mg via INTRAVENOUS

## 2011-04-02 MED ORDER — DOXYCYCLINE HYCLATE 100 MG PO CPEP: 100.0000 mg | ORAL_CAPSULE | Freq: Two times a day (BID) | ORAL | Status: DC

## 2011-04-02 MED ORDER — PROMETHAZINE HCL 25 MG/ML IJ SOLN: 6.2500 mg | INTRAMUSCULAR | Status: DC | PRN

## 2011-04-02 MED ORDER — BIOTIN 5000 MCG PO CAPS: 2.0000 | ORAL_CAPSULE | Freq: Two times a day (BID) | ORAL | Status: DC

## 2011-04-02 MED ORDER — SODIUM CHLORIDE 0.9 % IV SOLN
INTRAVENOUS | Status: DC
  Administered 2011-04-02 – 2011-04-03 (×2): via INTRAVENOUS

## 2011-04-02 MED ORDER — HYDROMORPHONE HCL PF 1 MG/ML IJ SOLN
0.2500 mg | INTRAMUSCULAR | Status: DC | PRN
  Administered 2011-04-02 (×4): 0.5 mg via INTRAVENOUS

## 2011-04-02 MED ORDER — MIDAZOLAM HCL 5 MG/5ML IJ SOLN
INTRAMUSCULAR | Status: DC | PRN
  Administered 2011-04-02: 2 mg via INTRAVENOUS

## 2011-04-02 MED ORDER — LIDOCAINE HCL (CARDIAC) 20 MG/ML IV SOLN
INTRAVENOUS | Status: DC | PRN
  Administered 2011-04-02: 80 mg via INTRAVENOUS

## 2011-04-02 MED ORDER — DOXYCYCLINE HYCLATE 100 MG PO TABS
100.0000 mg | ORAL_TABLET | Freq: Two times a day (BID) | ORAL | Status: DC
  Administered 2011-04-02 – 2011-04-05 (×5): 100 mg via ORAL
  Filled 2011-04-02 (×10): qty 1

## 2011-04-02 MED ORDER — MEPERIDINE HCL 25 MG/ML IJ SOLN: 6.2500 mg | INTRAMUSCULAR | Status: DC | PRN

## 2011-04-02 MED ORDER — FENTANYL 75 MCG/HR TD PT72
400.0000 ug | MEDICATED_PATCH | TRANSDERMAL | Status: DC
  Administered 2011-04-02: 400 ug via TRANSDERMAL
  Filled 2011-04-02: qty 4

## 2011-04-02 MED ORDER — THERA M PLUS PO TABS
1.0000 | ORAL_TABLET | Freq: Every day | ORAL | Status: DC
  Administered 2011-04-02 – 2011-04-05 (×4): 1 via ORAL
  Filled 2011-04-02 (×4): qty 1

## 2011-04-02 MED ORDER — SIMVASTATIN 20 MG PO TABS
20.0000 mg | ORAL_TABLET | Freq: Every day | ORAL | Status: DC
  Administered 2011-04-03 – 2011-04-05 (×3): 20 mg via ORAL
  Filled 2011-04-02 (×3): qty 1

## 2011-04-02 MED ORDER — INSULIN GLARGINE 100 UNIT/ML ~~LOC~~ SOLN
85.0000 [IU] | Freq: Every day | SUBCUTANEOUS | Status: DC
  Administered 2011-04-02 – 2011-04-03 (×2): 85 [IU] via SUBCUTANEOUS
  Filled 2011-04-02: qty 3

## 2011-04-02 MED ORDER — FENTANYL CITRATE 0.05 MG/ML IJ SOLN
100.0000 ug | Freq: Once | INTRAMUSCULAR | Status: AC
  Administered 2011-04-02: 100 ug via INTRAVENOUS

## 2011-04-02 MED ORDER — DIAZEPAM 5 MG PO TABS
10.0000 mg | ORAL_TABLET | Freq: Four times a day (QID) | ORAL | Status: DC | PRN
  Administered 2011-04-02 – 2011-04-03 (×2): 10 mg via ORAL
  Filled 2011-04-02 (×2): qty 2

## 2011-04-02 MED ORDER — ENOXAPARIN SODIUM 30 MG/0.3ML ~~LOC~~ SOLN
30.0000 mg | Freq: Two times a day (BID) | SUBCUTANEOUS | Status: DC
  Administered 2011-04-02 – 2011-04-05 (×6): 30 mg via SUBCUTANEOUS
  Filled 2011-04-02 (×7): qty 0.3

## 2011-04-02 MED ORDER — CITALOPRAM HYDROBROMIDE 20 MG PO TABS
20.0000 mg | ORAL_TABLET | Freq: Every day | ORAL | Status: DC
  Administered 2011-04-02 – 2011-04-05 (×4): 20 mg via ORAL
  Filled 2011-04-02 (×4): qty 1

## 2011-04-02 MED ORDER — HYDROMORPHONE HCL PF 1 MG/ML IJ SOLN
INTRAMUSCULAR | Status: AC
  Administered 2011-04-02: 0.5 mg via INTRAVENOUS
  Filled 2011-04-02: qty 1

## 2011-04-02 SURGICAL SUPPLY — 61 items
BANDAGE CONFORM 3  STR LF (GAUZE/BANDAGES/DRESSINGS) IMPLANT
BANDAGE ELASTIC 3 VELCRO ST LF (GAUZE/BANDAGES/DRESSINGS) IMPLANT
BLADE SURG 10 STRL SS (BLADE) ×2 IMPLANT
BNDG COHESIVE 1X5 TAN STRL LF (GAUZE/BANDAGES/DRESSINGS) IMPLANT
BNDG COHESIVE 4X5 TAN STRL (GAUZE/BANDAGES/DRESSINGS) ×2 IMPLANT
BNDG COHESIVE 6X5 TAN STRL LF (GAUZE/BANDAGES/DRESSINGS) ×4 IMPLANT
BNDG GAUZE STRTCH 6 (GAUZE/BANDAGES/DRESSINGS) ×6 IMPLANT
CLOTH BEACON ORANGE TIMEOUT ST (SAFETY) ×2 IMPLANT
CORDS BIPOLAR (ELECTRODE) IMPLANT
COVER SURGICAL LIGHT HANDLE (MISCELLANEOUS) ×2 IMPLANT
CUFF TOURNIQUET SINGLE 18IN (TOURNIQUET CUFF) ×1 IMPLANT
CUFF TOURNIQUET SINGLE 24IN (TOURNIQUET CUFF) IMPLANT
CUFF TOURNIQUET SINGLE 34IN LL (TOURNIQUET CUFF) IMPLANT
CUFF TOURNIQUET SINGLE 44IN (TOURNIQUET CUFF) IMPLANT
DRAPE ORTHO SPLIT 77X108 STRL (DRAPES) ×4
DRAPE SURG 17X23 STRL (DRAPES) IMPLANT
DRAPE SURG ORHT 6 SPLT 77X108 (DRAPES) ×2 IMPLANT
DRAPE U-SHAPE 47X51 STRL (DRAPES) ×2 IMPLANT
DRSG EMULSION OIL 3X3 NADH (GAUZE/BANDAGES/DRESSINGS) ×1 IMPLANT
DURAPREP 26ML APPLICATOR (WOUND CARE) ×2 IMPLANT
ELECT CAUTERY BLADE 6.4 (BLADE) IMPLANT
ELECT REM PT RETURN 9FT ADLT (ELECTROSURGICAL)
ELECTRODE REM PT RTRN 9FT ADLT (ELECTROSURGICAL) IMPLANT
FLUID NSS /IRRIG 3000 ML XXX (IV SOLUTION) ×1 IMPLANT
GAUZE KERLIX 2  STERILE LF (GAUZE/BANDAGES/DRESSINGS) ×1 IMPLANT
GAUZE XEROFORM 1X8 LF (GAUZE/BANDAGES/DRESSINGS) ×2 IMPLANT
GLOVE BIOGEL PI IND STRL 8 (GLOVE) ×2 IMPLANT
GLOVE BIOGEL PI INDICATOR 8 (GLOVE) ×2
GLOVE ORTHO TXT STRL SZ7.5 (GLOVE) ×2 IMPLANT
GOWN PREVENTION PLUS LG XLONG (DISPOSABLE) IMPLANT
GOWN PREVENTION PLUS XLARGE (GOWN DISPOSABLE) ×2 IMPLANT
GOWN STRL NON-REIN LRG LVL3 (GOWN DISPOSABLE) ×4 IMPLANT
HANDPIECE INTERPULSE COAX TIP (DISPOSABLE) ×2
KIT BASIN OR (CUSTOM PROCEDURE TRAY) ×2 IMPLANT
KIT ROOM TURNOVER OR (KITS) ×2 IMPLANT
MANIFOLD NEPTUNE II (INSTRUMENTS) ×2 IMPLANT
MICROMATRIX 500MG (Tissue) ×2 IMPLANT
NS IRRIG 1000ML POUR BTL (IV SOLUTION) ×2 IMPLANT
PACK ORTHO EXTREMITY (CUSTOM PROCEDURE TRAY) ×2 IMPLANT
PAD ARMBOARD 7.5X6 YLW CONV (MISCELLANEOUS) ×4 IMPLANT
PADDING CAST ABS 4INX4YD NS (CAST SUPPLIES) ×2
PADDING CAST ABS COTTON 4X4 ST (CAST SUPPLIES) ×2 IMPLANT
PADDING CAST COTTON 6X4 STRL (CAST SUPPLIES) ×2 IMPLANT
SET HNDPC FAN SPRY TIP SCT (DISPOSABLE) IMPLANT
SOLUTION PARTIC MCRMTRX 500MG (Tissue) IMPLANT
SPONGE GAUZE 4X4 12PLY (GAUZE/BANDAGES/DRESSINGS) ×2 IMPLANT
SPONGE GAUZE 4X4 STERILE 39 (GAUZE/BANDAGES/DRESSINGS) ×1 IMPLANT
SPONGE LAP 18X18 X RAY DECT (DISPOSABLE) ×2 IMPLANT
STOCKINETTE IMPERVIOUS 9X36 MD (GAUZE/BANDAGES/DRESSINGS) ×1 IMPLANT
SUT ETHILON 2 0 FS 18 (SUTURE) ×6 IMPLANT
SUT ETHILON 3 0 PS 1 (SUTURE) ×4 IMPLANT
SYR CONTROL 10ML LL (SYRINGE) IMPLANT
TOWEL OR 17X24 6PK STRL BLUE (TOWEL DISPOSABLE) ×2 IMPLANT
TOWEL OR 17X26 10 PK STRL BLUE (TOWEL DISPOSABLE) ×2 IMPLANT
TUBE ANAEROBIC SPECIMEN COL (MISCELLANEOUS) IMPLANT
TUBE CONNECTING 12X1/4 (SUCTIONS) ×2 IMPLANT
TUBE FEEDING 5FR 15 INCH (TUBING) IMPLANT
UNDERPAD 30X30 INCONTINENT (UNDERPADS AND DIAPERS) ×2 IMPLANT
WATER STERILE IRR 1000ML POUR (IV SOLUTION) ×1 IMPLANT
YANKAUER SUCT BULB TIP NO VENT (SUCTIONS) ×2 IMPLANT
surgical matrix 7cm x 70cm ×1 IMPLANT

## 2011-04-02 NOTE — Anesthesia Postprocedure Evaluation (Signed)
  Anesthesia Post-op Note  Patient: [name]  Procedure(s) Performed:  IRRIGATION AND DEBRIDEMENT EXTREMITY - I&D right heel ulcer, placement of A-cell graft  Patient Location: PACU  Anesthesia Type: General  Level of Consciousness: awake  Airway and Oxygen Therapy: Patient Spontanous Breathing  Post-op Pain: mild  Post-op Assessment: Post-op Vital signs reviewed  Post-op Vital Signs: stable  Complications: No apparent anesthesia complications

## 2011-04-02 NOTE — Transfer of Care (Signed)
Immediate Anesthesia Transfer of Care Note  Patient: [name]  Procedure(s) Performed:  IRRIGATION AND DEBRIDEMENT EXTREMITY - I&D right heel ulcer, placement of A-cell graft  Patient Location: PACU  Anesthesia Type: General  Level of Consciousness: awake, alert , oriented and patient cooperative  Airway & Oxygen Therapy: Patient Spontanous Breathing and Patient connected to face mask oxygen  Post-op Assessment: Report given to PACU RN, Post -op Vital signs reviewed and stable and Patient moving all extremities X 4  Post vital signs: Reviewed and stable  Complications: No apparent anesthesia complications

## 2011-04-02 NOTE — Brief Op Note (Signed)
04/02/2011  4:17 PM  PATIENT:  [name]  41 y.o. female  PRE-OPERATIVE DIAGNOSIS:  right heel ulcer  POST-OPERATIVE DIAGNOSIS:  right heel ulcer  PROCEDURE:  Procedure(s): 1)IRRIGATION AND DEBRIDEMENT EXTREMITY RIGHT HEEL - skin, soft tissue, bone 2)PLACEMENT OF ACELL XENOGRAFT RIGHT HEEL  SURGEON:  Surgeon(s): Kathryne Hitch  PHYSICIAN ASSISTANT:   ASSISTANTS: none   ANESTHESIA:   general  EBL:  Total I/O In: 1400 [I.V.:1400] Out: -   BLOOD ADMINISTERED:none  DRAINS: none   LOCAL MEDICATIONS USED:  NONE  SPECIMEN:  No Specimen  DISPOSITION OF SPECIMEN:  N/A  COUNTS:  YES  TOURNIQUET:  * Missing tourniquet times found for documented tourniquets in log:  [id number] *  DICTATION: .Other Dictation: Dictation Number [id number]  PLAN OF CARE: Admit to inpatient   PATIENT DISPOSITION:  PACU - hemodynamically stable.   Delay start of Pharmacological VTE agent (>24hrs) due to surgical blood loss or risk of bleeding:  {YES/NO/NOT APPLICABLE:20182

## 2011-04-02 NOTE — H&P (Signed)
[name] is an 41 y.o. female.   Chief Complaint:   Chronic right heel ulcer HPI:   41 yo female, morbidly obese and with multiple medical problems including DM who has a chronic, full-thickness, malodarous ulcer on her right heel.  Past Medical History  Diagnosis Date  . Diabetes mellitus   . Hypertension   . Wound healing, delayed   . H/O: Bell's palsy 2011  . Depression   . Fibromyalgia   . DJD (degenerative joint disease)   . Asthma   . Right foot drop   . Hypoventilation associated with obesity syndrome   . Hypothyroidism   . Back pain   . GERD (gastroesophageal reflux disease)   . Headache     Mirgraine- last one 03/31/2011  . Peripheral vascular disease   . Heart murmur   . Shortness of breath     with Activity  . Pneumonia     36 years ago  . Complication of anesthesia     Difficulty waking up  . PONV (postoperative nausea and vomiting)   . Chronic pain syndrome   . Non-healing non-surgical wound     Right hip, has Wound vac to hip.  Started as a skin tear.  . Chronic heel ulcer     Past Surgical History  Procedure Date  . Right elbow   . Back surgery     for lumbar disc disease X2  . 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 Cholecystectomy 1984  . Appendectomy 2008    Family History  Problem Relation Age of Onset  . Diabetes type II Father   . Diabetes type II Mother   . Anesthesia problems Mother    Social History:  reports that she has quit smoking. She has never used smokeless tobacco. She reports that she does not drink alcohol or use illicit drugs.  Allergies:  Allergies  Allergen Reactions  . Sulfa Antibiotics Hives    Medications Prior to Admission  Medication Dose Route Frequency Provider Last Rate Last Dose  . ceFAZolin (ANCEF) IVPB 2 g/50 mL premix  2 g Intravenous 60 min Pre-Op Kathryne Hitch      . lactated ringers infusion   Intravenous Continuous Rivka Barbara, MD 50 mL/hr at 04/02/11 1434      . mupirocin ointment (BACTROBAN) 2 %        1 application at 04/02/11 1319   Medications Prior to Admission  Medication Sig Dispense Refill  . atorvastatin (LIPITOR) 10 MG tablet Take 10 mg by mouth daily.        . benazepril (LOTENSIN) 10 MG tablet Take 10 mg by mouth daily.        . Biotin 5000 MCG CAPS Take 2 capsules by mouth 2 (two) times daily.        . carisoprodol (SOMA) 350 MG tablet Take 350 mg by mouth 4 (four) times daily as needed.        . celecoxib (CELEBREX) 400 MG capsule Take 400 mg by mouth 2 (two) times daily.        . ciprofloxacin (CIPRO) 500 MG tablet Take 500 mg by mouth 2 (two) times daily.        . citalopram (CELEXA) 20 MG tablet Take 20 mg by mouth daily.       . diazepam (VALIUM) 5 MG tablet Take 10 mg by mouth 4 (four) times daily as needed. Anxiety/sleep       .  doxycycline (DORYX) 100 MG DR capsule Take 100 mg by mouth 2 (two) times daily.        Marland Kitchen esomeprazole (NEXIUM) 40 MG capsule Take 40 mg by mouth daily before breakfast.       . fentaNYL (DURAGESIC - DOSED MCG/HR) 100 MCG/HR Place 4 patches onto the skin every 3 (three) days.       Marland Kitchen gabapentin (NEURONTIN) 300 MG capsule Take 300 mg by mouth at bedtime.       Marland Kitchen GARLIC PO Take 1 tablet by mouth daily.        Marland Kitchen HYDROmorphone (DILAUDID) 8 MG tablet Take 8 mg by mouth every 4 (four) hours as needed. Pain       . insulin glargine (LANTUS) 100 UNIT/ML injection Inject 85 Units into the skin at bedtime. Took 25 units this a.m., prior to that 85 units Sunday evening - 03/31/2011      . Iron Combinations (IRON COMPLEX PO) Take 2 tablets by mouth 2 (two) times daily.        Marland Kitchen levothyroxine (SYNTHROID, LEVOTHROID) 112 MCG tablet Take 112 mcg by mouth daily.        . Liraglutide (VICTOZA) 18 MG/3ML SOLN Inject 1.8 mg into the skin daily.       . metoCLOPramide (REGLAN) 10 MG tablet Take 10 mg by mouth every 8 (eight) hours.        . mometasone (NASONEX) 50 MCG/ACT nasal spray Place 2 sprays into the nose daily.         Marland Kitchen nystatin (NYSTOP) 100000 UNIT/GM POWD Apply 1 g (100,000 Units total) topically 2 (two) times daily.  1 Bottle  1  . oxyCODONE-acetaminophen (PERCOCET) 10-325 MG per tablet Take 2 tablets by mouth every 4 (four) hours. pain      . pregabalin (LYRICA) 75 MG capsule Take 75 mg by mouth 3 (three) times daily.       . sitaGLIPtan-metformin (JANUMET) 50-1000 MG per tablet Take 1 tablet by mouth 2 (two) times daily with a meal.       . collagenase (SANTYL) ointment Apply 1 application topically daily.        . Cyanocobalamin (VITAMIN B-12 IJ) Inject 100 Units as directed every 30 (thirty) days.        . Multiple Vitamins-Minerals (MULTIVITAMINS THER. W/MINERALS) TABS Take 1 tablet by mouth daily.        Marland Kitchen VITAMIN D, CHOLECALCIFEROL, PO Take 1 capsule by mouth daily.         Results for orders placed during the hospital encounter of 04/02/11 (from the past 48 hour(s))  GLUCOSE, CAPILLARY     Status: Abnormal   Collection Time   04/02/11 12:52 PM      Component Value Range Comment   Glucose-Capillary 167 (*) 70 - 99 (mg/dL)   BASIC METABOLIC PANEL     Status: Abnormal   Collection Time   04/02/11 12:56 PM      Component Value Range Comment   Sodium 138  135 - 145 (mEq/L)    Potassium 3.9  3.5 - 5.1 (mEq/L)    Chloride 100  96 - 112 (mEq/L)    CO2 26  19 - 32 (mEq/L)    Glucose, Bld 181 (*) 70 - 99 (mg/dL)    BUN 18  6 - 23 (mg/dL)    Creatinine, Ser 7.82  0.50 - 1.10 (mg/dL)    Calcium 9.2  8.4 - 10.5 (mg/dL)    GFR calc non Af [name]  79 (*) >90 (mL/min)    GFR calc Af Amer >90  >90 (mL/min)   CBC     Status: Abnormal   Collection Time   04/02/11 12:56 PM      Component Value Range Comment   WBC 12.9 (*) 4.0 - 10.5 (K/uL)    RBC 4.15  3.87 - 5.11 (MIL/uL)    Hemoglobin 12.2  12.0 - 15.0 (g/dL)    HCT 16.1  09.6 - 04.5 (%)    MCV 91.3  78.0 - 100.0 (fL)    MCH 29.4  26.0 - 34.0 (pg)    MCHC 32.2  30.0 - 36.0 (g/dL)    RDW 40.9  81.1 - 91.4 (%)    Platelets 198  150 - 400 (K/uL)   HCG,  SERUM, QUALITATIVE     Status: Normal   Collection Time   04/02/11 12:56 PM      Component Value Range Comment   Preg, Serum NEGATIVE  NEGATIVE    GLUCOSE, CAPILLARY     Status: Abnormal   Collection Time   04/02/11  2:39 PM      Component Value Range Comment   Glucose-Capillary 155 (*) 70 - 99 (mg/dL)    No results found.  Review of Systems  All other systems reviewed and are negative.    Blood pressure 134/83, pulse 82, temperature 98.7 F (37.1 C), temperature source Oral, resp. rate 20, height 5\' 5"  (1.651 m), weight 218.634 kg (482 lb), last menstrual period 09/23/2010, SpO2 94.00%. Physical Exam  Constitutional: She is oriented to person, place, and time. She appears well-developed and well-nourished.  HENT:  Head: Normocephalic and atraumatic.  Eyes: Pupils are equal, round, and reactive to light.  Neck: Normal range of motion. Neck supple.  Cardiovascular: Normal rate and regular rhythm.   Respiratory: Effort normal and breath sounds normal.  GI: Soft. Bowel sounds are normal.  Musculoskeletal:       Right foot: She exhibits swelling and deformity.       Feet:  Neurological: She is alert and oriented to person, place, and time.  Skin: Skin is warm and dry.  Psychiatric: She has a normal mood and affect.     Assessment/Plan To the OR for irrigation and debridement of right heel ulcer and hopeful placement of ACELL xenograft.  Oriel Rumbold Y 04/02/2011, 2:54 PM

## 2011-04-02 NOTE — Anesthesia Preprocedure Evaluation (Addendum)
Anesthesia Evaluation  Patient identified by MRN, date of birth, ID band Patient awake    Reviewed: Allergy & Precautions, H&P , NPO status , Patient's Chart, lab work & pertinent test results  History of Anesthesia Complications (+) PONV  Airway Mallampati: II      Dental   Pulmonary shortness of breath and with exertion, asthma , sleep apnea , pneumonia ,  clear to auscultation        Cardiovascular hypertension, Pt. on medications + Valvular Problems/Murmurs Regular Normal    Neuro/Psych  Headaches, Anxiety Depression    GI/Hepatic Neg liver ROS, GERD-  ,  Endo/Other  Diabetes mellitus-, Type 2, Oral Hypoglycemic Agents and Insulin DependentHypothyroidism   Renal/GU negative Renal ROS     Musculoskeletal  (+) Fibromyalgia -  Abdominal   Peds  Hematology negative hematology ROS (+)   Anesthesia Other Findings   Reproductive/Obstetrics                          Anesthesia Physical Anesthesia Plan  ASA: III  Anesthesia Plan: General   Post-op Pain Management:    Induction: Intravenous, Rapid sequence and Cricoid pressure planned  Airway Management Planned: Oral ETT  Additional Equipment:   Intra-op Plan:   Post-operative Plan: Extubation in OR  Informed Consent: I have reviewed the patients History and Physical, chart, labs and discussed the procedure including the risks, benefits and alternatives for the proposed anesthesia with the patient or authorized representative who has indicated his/her understanding and acceptance.   Dental advisory given  Plan Discussed with: CRNA  Anesthesia Plan Comments:         Anesthesia Quick Evaluation

## 2011-04-03 ENCOUNTER — Encounter (HOSPITAL_COMMUNITY): Payer: Self-pay | Admitting: Orthopaedic Surgery

## 2011-04-03 LAB — GLUCOSE, CAPILLARY
Glucose-Capillary: 119 mg/dL — ABNORMAL HIGH (ref 70–99)
Glucose-Capillary: 120 mg/dL — ABNORMAL HIGH (ref 70–99)
Glucose-Capillary: 120 mg/dL — ABNORMAL HIGH (ref 70–99)

## 2011-04-03 NOTE — Op Note (Signed)
[name], [name] NO.:  [id number]  MEDICAL RECORD NO.:  [id number]  LOCATION:  5017                         FACILITY:  [facility]  PHYSICIAN:  Vanita Panda. Magnus Ivan, M.D.DATE OF BIRTH:  July 07, 1970  DATE OF PROCEDURE:  04/02/2011 DATE OF DISCHARGE:                              OPERATIVE REPORT   PREOPERATIVE DIAGNOSIS:  Chronic right heel wound with necrotic tissue.  POSTOPERATIVE DIAGNOSIS:  Chronic right heel wound with necrotic soft tissue, measuring 5 cm x 3 cm x 7 cm.  PROCEDURE: 1. Irrigation and debridement of right heel including skin, soft     tissue, and bone. 2. Placement of ACell xenograft, right heel wound.  SURGEON:  Vanita Panda. Magnus Ivan, MD  ANESTHESIA:  General.  ESTIMATED BLOOD LOSS:  Less than 100 mL.  COMPLICATIONS:  None.  INDICATIONS:  [name] is a 41 year old morbidly obese female in excess of 450+ pounds.  She has had a necrotic heel ulcer on the bottom of her right heel.  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  This has been debrided several times, I believe, at the bedside.  She saw me in the office as a new patient and I was able to clean it some in the office but recommended she undergo a thorough surgical irrigation and debridement with a likely ACell placement.  The risks and benefits of this had been explained to her and her husband in detail and they do wish to proceed with surgery.  PROCEDURE DESCRIPTION:  After informed consent was obtained, appropriate right heel was marked.  She was brought to the operating room and placed supine on the operating table.  General anesthesia was then obtained. Her right foot and ankle were prepped and draped with Betadine scrub and paint.  A time-out was called and she was identified as the correct patient and the correct right lower extremity.  Attention was first turned to the necrotic tissue.  Using a #10 blade, I was able to debride a large  area of necrotic black foul smelling tissue, but I did not find any evidence of infection.  The bone itself was actually protected.  I was able to use a rongeur to debride some necrotic tissue over the bone itself and some of the bone to get a good bleeding base of tissue.  The wound had at least a centimeter to 3 cm of depth and was otherwise about 7 cm.  Once I got good bleeding tissue, hemostasis was obtained with electrocautery.  I then used pulsatile lavage and 3 L normal saline solution to lavage the wound.  Next, a vial of ACell powder was placed deep into the wound and then a thick sheet of ACell was fenestrated and then sewn deep into the wound and circumferentially around it.  I next placed Adaptic, Surgilube damp, gauze, Kerlix, and a Coban around this.  She was awakened, extubated, and taken to the recovery in stable condition.  All final counts correct.  There were no complications noted.  Postoperatively, she will be admitted for observation and pain control with elevating her heels.     Vanita Panda. Magnus Ivan, M.D.     CYB/MEDQ  D:  04/02/2011  T:  04/03/2011  Job:  [id number]

## 2011-04-03 NOTE — Progress Notes (Signed)
Utilization review completed. Tyneisha Hegeman, RN, BSN. 04/03/11  

## 2011-04-03 NOTE — Progress Notes (Signed)
Patient ID: [name], female   DOB: 13-Feb-1971, 41 y.o.   MRN: [id number] ACELL allograft tissue applied to right heel wound yesterday.  Will need to keep her weight off of this heel for the next 2 months.  This does make for a difficult situation given her wt of 480 lbs. AF/VSS Dressing clean and intact right heel. Heel off of the bed.  Plan: Will keep her here until this Friday to protect the wound/dressing/etc. Before allowing her to go home.

## 2011-04-04 ENCOUNTER — Ambulatory Visit: Payer: Medicare Other | Admitting: Critical Care Medicine

## 2011-04-04 LAB — GLUCOSE, CAPILLARY
Glucose-Capillary: 100 mg/dL — ABNORMAL HIGH (ref 70–99)
Glucose-Capillary: 118 mg/dL — ABNORMAL HIGH (ref 70–99)
Glucose-Capillary: 72 mg/dL (ref 70–99)
Glucose-Capillary: 104 mg/dL — ABNORMAL HIGH (ref 70–99)

## 2011-04-04 NOTE — Progress Notes (Signed)
Patient ID: [name], female   DOB: 1970-12-11, 41 y.o.   MRN: [id number] No acute changes. Right heel floating off of bed appropriately.  Plan: D/C home tomorrow after dressing change at bedside

## 2011-04-04 NOTE — Progress Notes (Signed)
Pt came to hospital with a wound vac to her R hip that was being changed per [facility]. Pt was concerned that this needed to be changed while hospitalized. I spoke with Dr. Magnus Ivan regarding this issue and he stated that pt will be discharged home tomorrow and [facility] can resume their drsg changes as scheduled once she returns home.  Tammy Sours

## 2011-04-05 LAB — GLUCOSE, CAPILLARY
Glucose-Capillary: 79 mg/dL (ref 70–99)
Glucose-Capillary: 114 mg/dL — ABNORMAL HIGH (ref 70–99)
Glucose-Capillary: 85 mg/dL (ref 70–99)

## 2011-04-05 MED ORDER — OXYCODONE HCL 5 MG PO TABS: 5.0000 mg | ORAL_TABLET | ORAL | Status: AC | PRN

## 2011-04-05 MED ORDER — NYSTATIN 100000 UNIT/GM EX POWD: Freq: Four times a day (QID) | CUTANEOUS | Status: DC

## 2011-04-05 NOTE — Progress Notes (Signed)
CARE MANAGEMENT NOTE 04/05/2011 Discharge planning. Patient active with [facility], They will resume care at discharge.

## 2011-04-05 NOTE — Discharge Summary (Signed)
Physician Discharge Summary  Patient ID: [name] MRN: [id number] DOB/AGE: 1970-12-06 41 y.o.  Admit date: 04/02/2011 Discharge date: 04/05/2011  Admission Diagnoses:  <principal problem not specified>  Discharge Diagnoses:  Active Problems:  Chronic heel ulcer   Past Medical History  Diagnosis Date  . Diabetes mellitus   . Hypertension   . Wound healing, delayed   . H/O: Bell's palsy 2011  . Depression   . Fibromyalgia   . DJD (degenerative joint disease)   . Asthma   . Right foot drop   . Hypoventilation associated with obesity syndrome   . Hypothyroidism   . Back pain   . GERD (gastroesophageal reflux disease)   . Headache     Mirgraine- last one 03/31/2011  . Peripheral vascular disease   . Heart murmur   . Shortness of breath     with Activity  . Pneumonia     36 years ago  . Complication of anesthesia     Difficulty waking up  . PONV (postoperative nausea and vomiting)   . Chronic pain syndrome   . Non-healing non-surgical wound     Right hip, has Wound vac to hip.  Started as a skin tear.  . Chronic heel ulcer     Surgeries: Procedure(s): IRRIGATION AND DEBRIDEMENT EXTREMITY on 04/02/2011   Consultants (if any):    Discharged Condition: Improved  Hospital Course: [name] is an 41 y.o. female who was admitted 04/02/2011 with a diagnosis of <principal problem not specified> and went to the operating room on 04/02/2011 and underwent the above named procedures.    She was given perioperative antibiotics:  Anti-infectives     Start     Dose/Rate Route Frequency Ordered Stop   04/02/11 2200   ciprofloxacin (CIPRO) tablet 500 mg        500 mg Oral 2 times daily 04/02/11 1908     04/02/11 2200   doxycycline (DORYX) DR capsule 100 mg  Status:  Discontinued        100 mg Oral 2 times daily 04/02/11 1908 04/02/11 1913   04/02/11 2200   doxycycline (VIBRA-TABS) tablet 100 mg        100 mg Oral Every 12 hours 04/02/11 1914     04/01/11 1445    ceFAZolin (ANCEF) IVPB 2 g/50 mL premix        2 g 100 mL/hr over 30 Minutes Intravenous 60 min pre-op 04/01/11 1439 04/02/11 1515        .  She was given sequential compression devices, early ambulation, and chemoprophylaxis for DVT prophylaxis.  She benefited maximally from their hospital stay and there were no complications.    Recent vital signs:  Filed Vitals:   04/05/11 0515  BP: 120/66  Pulse: 68  Temp: 97.6 F (36.4 C)  Resp: 18    Recent laboratory studies:  Lab Results  Component Value Date   HGB 12.2 04/02/2011   HGB 9.6* 03/06/2011   HGB 11.4* 03/05/2011   Lab Results  Component Value Date   WBC 12.9* 04/02/2011   PLT 198 04/02/2011   Lab Results  Component Value Date   INR 1.1 05/14/2008   Lab Results  Component Value Date   NA 138 04/02/2011   K 3.9 04/02/2011   CL 100 04/02/2011   CO2 26 04/02/2011   BUN 18 04/02/2011   CREATININE 0.89 04/02/2011   GLUCOSE 181* 04/02/2011    Discharge Medications:   Current Discharge Medication List  START taking these medications   Details  !! nystatin (MYCOSTATIN) powder Apply topically 4 (four) times daily. Qty: 15 g, Refills: 0    oxyCODONE (ROXICODONE) 5 MG immediate release tablet Take 1 tablet (5 mg total) by mouth every 4 (four) hours as needed for pain. Qty: 60 tablet, Refills: 0     !! - Potential duplicate medications found. Please discuss with provider.    CONTINUE these medications which have NOT CHANGED   Details  atorvastatin (LIPITOR) 10 MG tablet Take 10 mg by mouth daily.      benazepril (LOTENSIN) 10 MG tablet Take 10 mg by mouth daily.      Biotin 5000 MCG CAPS Take 2 capsules by mouth 2 (two) times daily.      carisoprodol (SOMA) 350 MG tablet Take 350 mg by mouth 4 (four) times daily as needed.      celecoxib (CELEBREX) 400 MG capsule Take 400 mg by mouth 2 (two) times daily.      ciprofloxacin (CIPRO) 500 MG tablet Take 500 mg by mouth 2 (two) times daily.      citalopram (CELEXA) 20 MG  tablet Take 20 mg by mouth daily.     collagenase (SANTYL) ointment Apply 1 application topically daily.      Cyanocobalamin (VITAMIN B-12 IJ) Inject 100 Units as directed every 30 (thirty) days.      diazepam (VALIUM) 5 MG tablet Take 10 mg by mouth 4 (four) times daily as needed. Anxiety/sleep     doxycycline (DORYX) 100 MG DR capsule Take 100 mg by mouth 2 (two) times daily.      esomeprazole (NEXIUM) 40 MG capsule Take 40 mg by mouth daily before breakfast.     fentaNYL (DURAGESIC - DOSED MCG/HR) 100 MCG/HR Place 4 patches onto the skin every 3 (three) days.     gabapentin (NEURONTIN) 300 MG capsule Take 300 mg by mouth at bedtime.     GARLIC PO Take 1 tablet by mouth daily.      HYDROmorphone (DILAUDID) 8 MG tablet Take 8 mg by mouth every 4 (four) hours as needed. Pain     insulin glargine (LANTUS) 100 UNIT/ML injection Inject 85 Units into the skin at bedtime. Took 25 units this a.m., prior to that 85 units Sunday evening - 03/31/2011    Iron Combinations (IRON COMPLEX PO) Take 2 tablets by mouth 2 (two) times daily.      levothyroxine (SYNTHROID, LEVOTHROID) 112 MCG tablet Take 112 mcg by mouth daily.      Liraglutide (VICTOZA) 18 MG/3ML SOLN Inject 1.8 mg into the skin daily.     metoCLOPramide (REGLAN) 10 MG tablet Take 10 mg by mouth every 8 (eight) hours.      mometasone (NASONEX) 50 MCG/ACT nasal spray Place 2 sprays into the nose daily.      Multiple Vitamins-Minerals (MULTIVITAMINS THER. W/MINERALS) TABS Take 1 tablet by mouth daily.      !! nystatin (NYSTOP) 100000 UNIT/GM POWD Apply 1 g (100,000 Units total) topically 2 (two) times daily. Qty: 1 Bottle, Refills: 1    oxyCODONE-acetaminophen (PERCOCET) 10-325 MG per tablet Take 2 tablets by mouth every 4 (four) hours. pain    pregabalin (LYRICA) 75 MG capsule Take 75 mg by mouth 3 (three) times daily.     sitaGLIPtan-metformin (JANUMET) 50-1000 MG per tablet Take 1 tablet by mouth 2 (two) times daily with a  meal.     VITAMIN D, CHOLECALCIFEROL, PO Take 1 capsule by mouth daily.      !! -  Potential duplicate medications found. Please discuss with provider.      Diagnostic Studies: Dg Chest 2 View  04/02/2011  *RADIOLOGY REPORT*  Clinical Data: Preop evaluation  CHEST - 2 VIEW  Comparison: 03/05/2011  Findings: The patient is morbidly obese degrading image quality due to large patient size.  In addition,   the AP view is significantly lordotic.  Bibasilar airspace disease is present which is probably atelectasis.  Negative for heart failure.  IMPRESSION: Study is significantly limited by morbid obesity.  Bibasilar airspace disease, most likely atelectasis.  Original Report Authenticated By: Camelia Phenes, M.D.    Disposition: Home or Self Care  Discharge Orders    Future Orders Please Complete By Expires   Care order/instruction      Scheduling Instructions:   Needs correct size boot (x-large) to bedside         Signed: Kathryne Hitch 04/05/2011, 7:00 AM

## 2011-04-05 NOTE — Progress Notes (Signed)
Pt discharged home via [facility].   Dede Query, MSW, Theresia Majors [phone]

## 2011-06-05 ENCOUNTER — Encounter (HOSPITAL_BASED_OUTPATIENT_CLINIC_OR_DEPARTMENT_OTHER): Payer: Medicare Other | Attending: Plastic Surgery

## 2011-06-05 ENCOUNTER — Other Ambulatory Visit (HOSPITAL_COMMUNITY): Payer: Self-pay | Admitting: Plastic Surgery

## 2011-06-05 ENCOUNTER — Ambulatory Visit (HOSPITAL_COMMUNITY)
Admission: RE | Admit: 2011-06-05 | Discharge: 2011-06-05 | Disposition: A | Discharge status: Home / Self Care | Payer: Medicare Other | Source: Ambulatory Visit | Attending: Plastic Surgery | Admitting: Plastic Surgery

## 2011-06-05 DIAGNOSIS — M5137 Other intervertebral disc degeneration, lumbosacral region: Secondary | ICD-10-CM | POA: Insufficient documentation

## 2011-06-05 DIAGNOSIS — E039 Hypothyroidism, unspecified: Secondary | ICD-10-CM | POA: Insufficient documentation

## 2011-06-05 DIAGNOSIS — I739 Peripheral vascular disease, unspecified: Secondary | ICD-10-CM | POA: Insufficient documentation

## 2011-06-05 DIAGNOSIS — R52 Pain, unspecified: Secondary | ICD-10-CM

## 2011-06-05 DIAGNOSIS — Z79899 Other long term (current) drug therapy: Secondary | ICD-10-CM | POA: Insufficient documentation

## 2011-06-05 DIAGNOSIS — E119 Type 2 diabetes mellitus without complications: Secondary | ICD-10-CM | POA: Insufficient documentation

## 2011-06-05 DIAGNOSIS — L97409 Non-pressure chronic ulcer of unspecified heel and midfoot with unspecified severity: Secondary | ICD-10-CM | POA: Insufficient documentation

## 2011-06-05 DIAGNOSIS — L8993 Pressure ulcer of unspecified site, stage 3: Secondary | ICD-10-CM | POA: Insufficient documentation

## 2011-06-05 DIAGNOSIS — L89209 Pressure ulcer of unspecified hip, unspecified stage: Secondary | ICD-10-CM | POA: Insufficient documentation

## 2011-06-05 DIAGNOSIS — M51379 Other intervertebral disc degeneration, lumbosacral region without mention of lumbar back pain or lower extremity pain: Secondary | ICD-10-CM | POA: Insufficient documentation

## 2011-06-05 DIAGNOSIS — I10 Essential (primary) hypertension: Secondary | ICD-10-CM | POA: Insufficient documentation

## 2011-06-05 IMAGING — CR DG HIP COMPLETE 2+V*R*
4 series · 4 of 4 positions shown · non-contrast
Comparison: [DATE]

CLINICAL DATA: Pain

RIGHT HIP - COMPLETE 2+ VIEW

[t pelvis a.p. (1 of 2)]
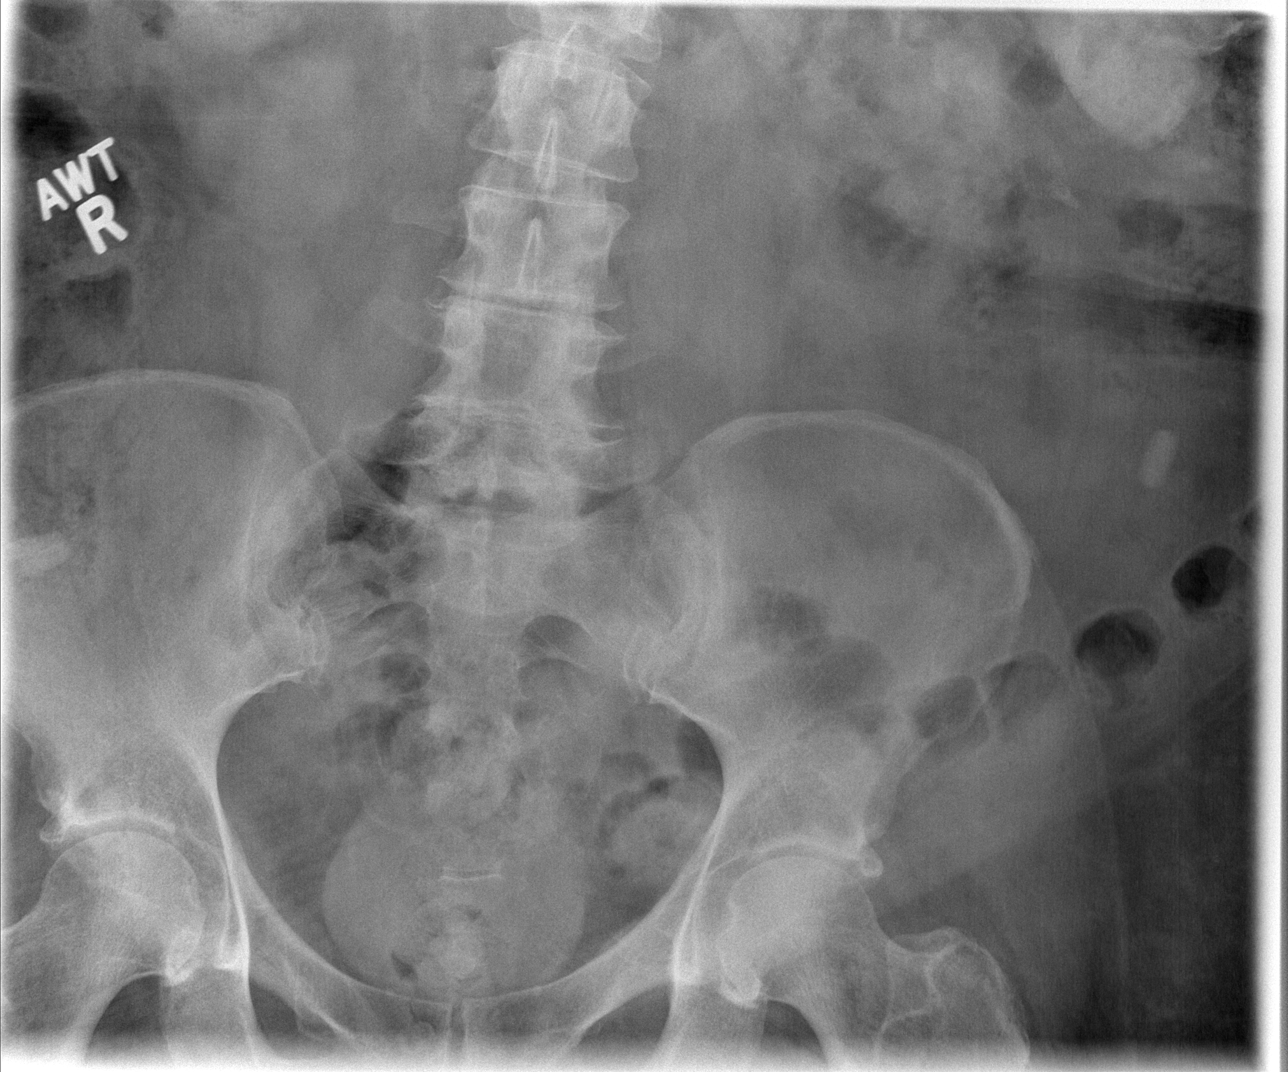

[t pelvis a.p. (2 of 2)]
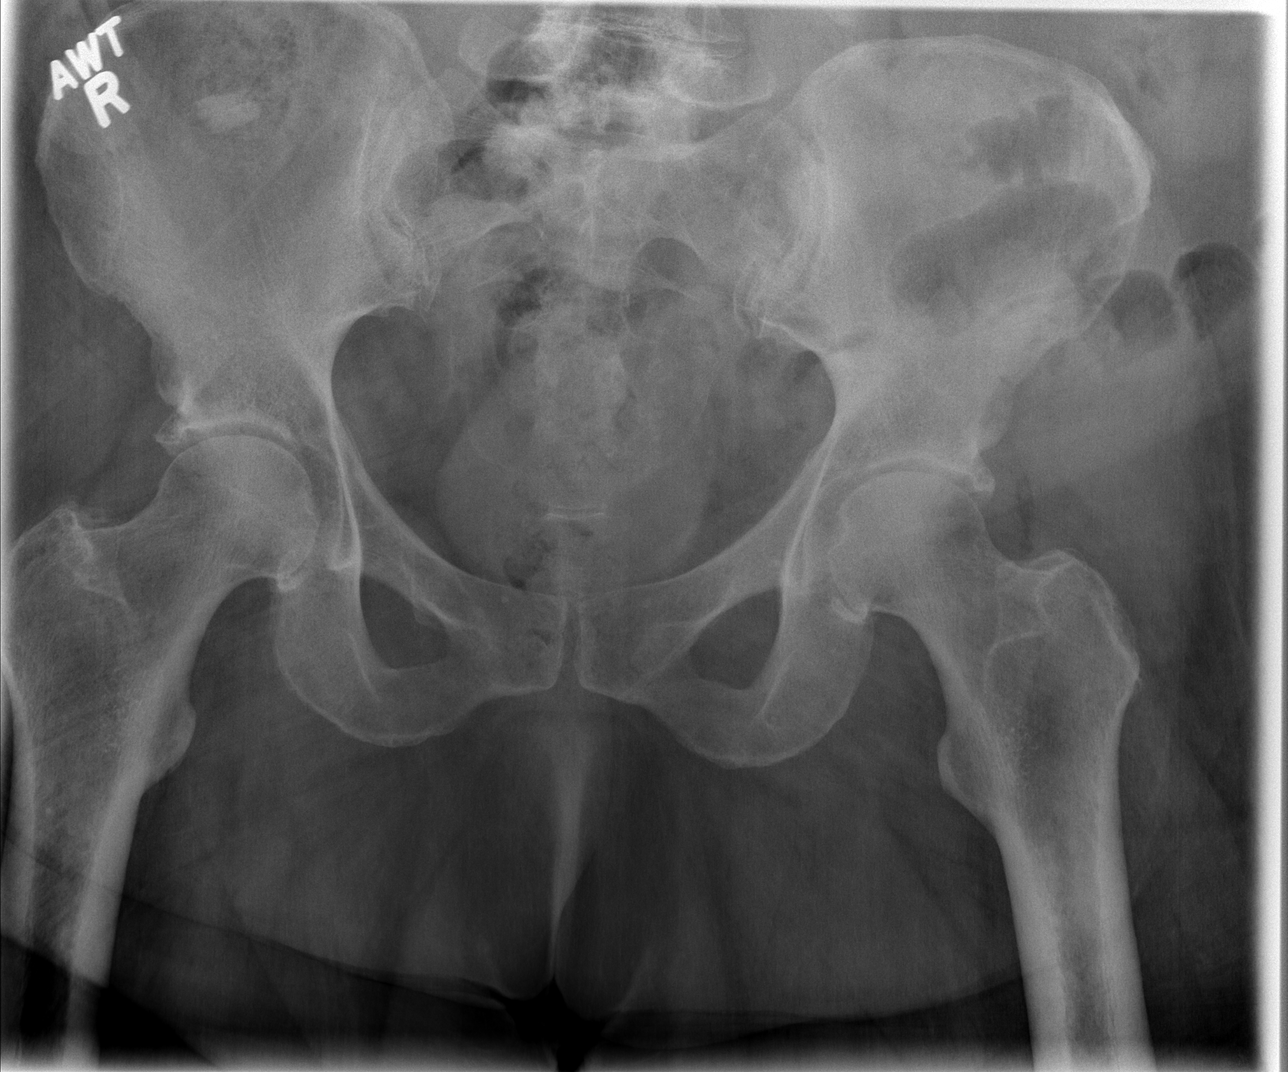

[t hip ap right]
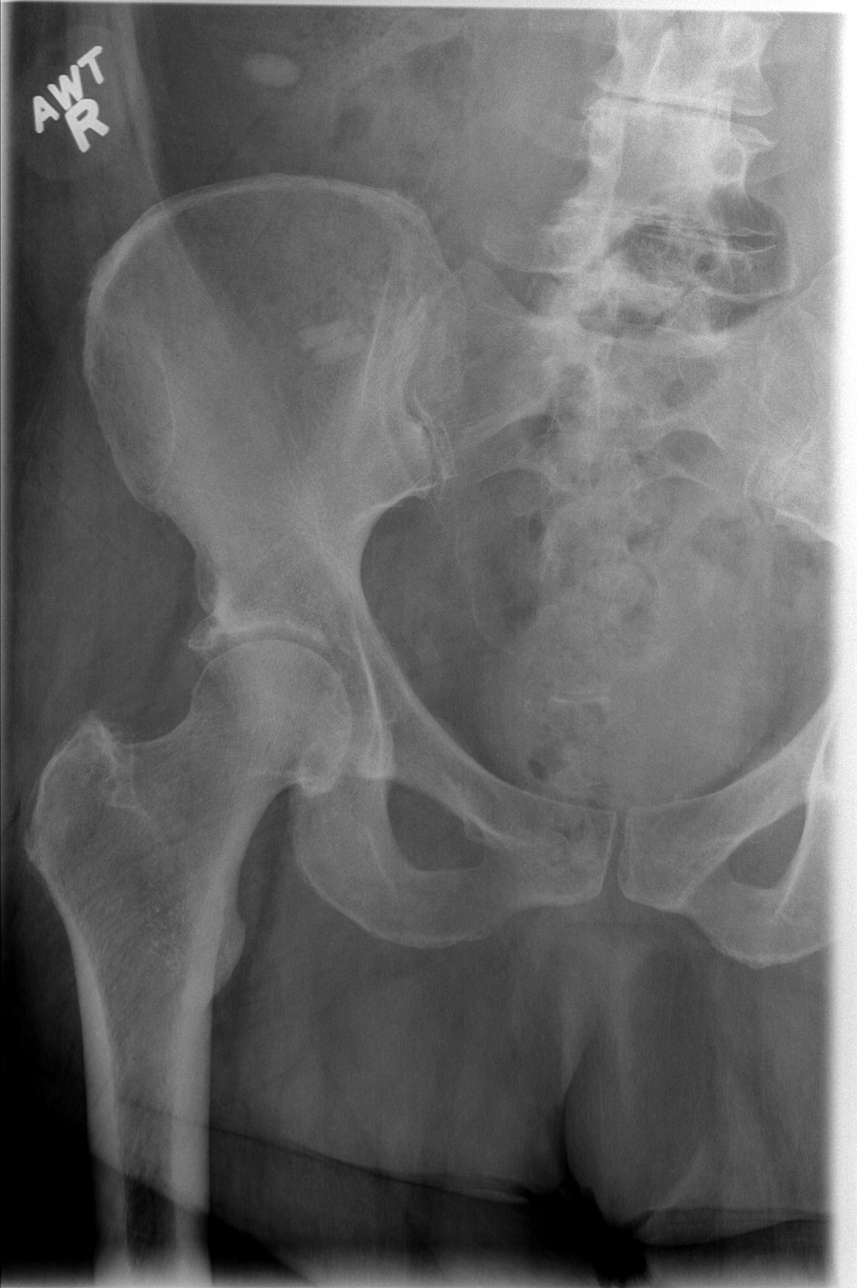

[t hip frog leg right]
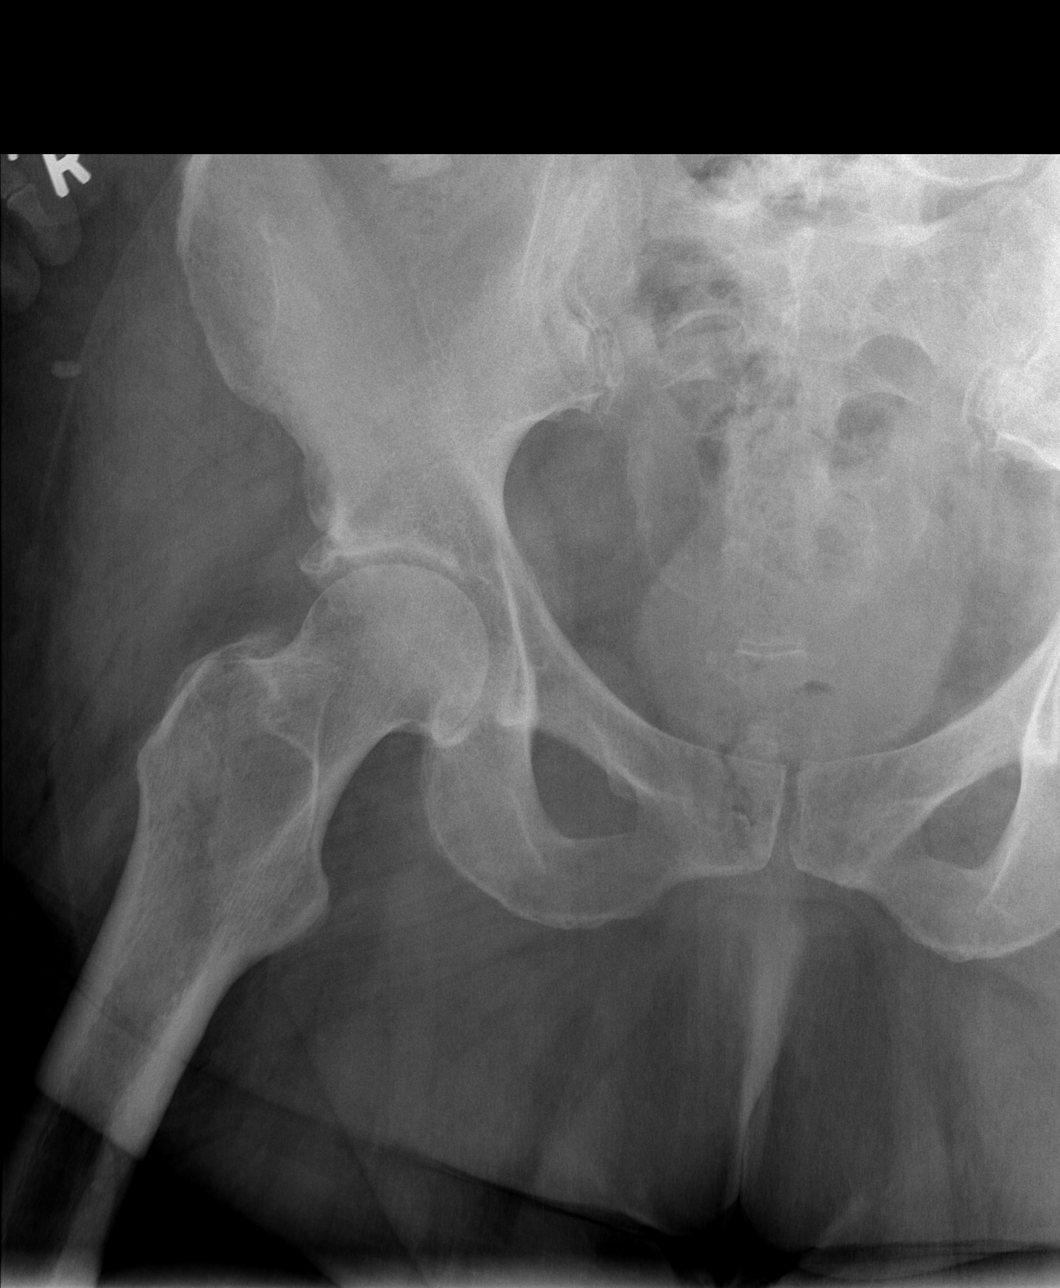

[4 of 4 positions shown; findings below may reference images not displayed]

FINDINGS: Degenerative changes in the visualized lower lumbar
spine.  No focal areas of cortical destruction. Negative for
fracture, dislocation, or other acute abnormality.  Normal
alignment and mineralization.   Regional soft tissues unremarkable.
IMPRESSION: 1.  Negative   right hip.
2.  Degenerative changes in the lower lumbar spine.

## 2011-06-06 NOTE — Progress Notes (Signed)
[facility]  NAME:  [name], [name]             ACCOUNT NO.:  [id number]  MEDICAL RECORD NO.:  [id number]      DATE OF BIRTH:  [birth year]  PHYSICIAN:  Wayland Denis, DO       VISIT DATE:  06/05/2011                                  OFFICE VISIT   CHIEF COMPLAINT:  Right hip ulcer.  HISTORY OF PRESENT ILLNESS:  The patient is a 40 year old white female, here with her husband for evaluation of her right hip ulcerations.  She has had this for several months, and has been undergoing treatment with Orthopedic Surgery with ACell and dressing changes.  She was at admitted to the hospital for treatment of a right heel ulcer for which she seemed to get quite sick during the process of skin breakdown.  She had ACell placed on the heel ulcer and that has improved greatly.  She is in a splint for that.  The hip ulcer was treated with the ACell and powder and as well as a VAC at times.  The area has improved greatly with a decrease in the overall size and depth.  Unfortunately, the skin portion is healing faster than the underneath portion.  Her past medical history is positive for diabetes, hypertension, delayed wound healing, Bell's palsy, depression, fibromyalgia, degenerative joint disease, asthma, right footdrop, hypoventilation associated with obesity, hypothyroidism, chronic back pain, gastroesophageal reflux, migraine headaches, peripheral vascular disease, heart murmur, shortness of breath, pneumonia, chronic pain syndrome, and heel ulcer.  Past surgical history is positive for right elbow, back surgery, brain surgery, cholecystectomy, appendectomy, and right foot surgery.  Family history is positive for diabetes.  SOCIAL HISTORY:  The patient is not smoking and does not use smokeless tobacco.  She lives at home.  Does not drink or use illicit drugs either.  Her allergies include SULFA, STADOL, and AUGMENTIN.  Her medications fluconazole, Reglan, Soma,  Dilaudid, Percocet, fentanyl, Lipitor, Valium, Biotin, vitamin B, iron.  REVIEW OF SYSTEMS:  No significant change in her weight, change in her breathing, or chest pain.  PHYSICAL EXAMINATION:  GENERAL:  She is alert, oriented, cooperative, not in any acute distress.  She is pleasant.  She seems to be a good historian. HEENT:  Her pupils are equal.  Her extraocular muscles are intact.  No cervical lymphadenopathy. ABDOMEN:  Nontender, but extremely large.  The ulcer is noted above and in the nurse's note.  Periwound area, the skin is intact and the right leg has a splint in place.  We would like to check a film to be sure that there is no further involvement within the soft tissue of the right hip.  We may or may not need a CT or MRI depending on the results. We will also check a prealbumin, it is documented in December was around 18, so we will see if it has changed since then.  She will likely need the skin opened up in order to facilitate better care of the wound, placement of a VAC is certainly an option and more ACell depending on how it looks.     Wayland Denis, DO     CS/MEDQ  D:  06/05/2011  T:  06/06/2011  Job:  [id number]

## 2011-07-03 ENCOUNTER — Encounter (HOSPITAL_BASED_OUTPATIENT_CLINIC_OR_DEPARTMENT_OTHER): Payer: Medicare Other

## 2011-07-10 ENCOUNTER — Encounter (HOSPITAL_BASED_OUTPATIENT_CLINIC_OR_DEPARTMENT_OTHER): Payer: Medicare Other

## 2011-07-30 ENCOUNTER — Ambulatory Visit: Payer: Self-pay | Admitting: Pain Medicine

## 2011-07-31 ENCOUNTER — Encounter (HOSPITAL_BASED_OUTPATIENT_CLINIC_OR_DEPARTMENT_OTHER): Payer: Self-pay | Admitting: *Deleted

## 2011-08-05 ENCOUNTER — Ambulatory Visit (HOSPITAL_BASED_OUTPATIENT_CLINIC_OR_DEPARTMENT_OTHER): Admission: RE | Admit: 2011-08-05 | Payer: Medicare Other | Source: Ambulatory Visit | Admitting: Plastic Surgery

## 2011-08-05 ENCOUNTER — Encounter (HOSPITAL_BASED_OUTPATIENT_CLINIC_OR_DEPARTMENT_OTHER): Admission: RE | Payer: Self-pay | Source: Ambulatory Visit

## 2011-08-05 HISTORY — DX: Personal history of Methicillin resistant Staphylococcus aureus infection: Z86.14

## 2011-08-05 HISTORY — DX: Reserved for inherently not codable concepts without codable children: IMO0001

## 2011-08-05 HISTORY — DX: Encounter for other specified aftercare: Z51.89

## 2011-08-05 SURGERY — IRRIGATION AND DEBRIDEMENT WOUND
Anesthesia: General | Laterality: Left

## 2011-08-15 ENCOUNTER — Encounter (HOSPITAL_COMMUNITY): Payer: Self-pay | Admitting: Pharmacy Technician

## 2011-08-15 ENCOUNTER — Other Ambulatory Visit: Payer: Self-pay | Admitting: Plastic Surgery

## 2011-08-16 ENCOUNTER — Inpatient Hospital Stay (HOSPITAL_COMMUNITY): Admission: RE | Admit: 2011-08-16 | Discharge: 2011-08-16 | Payer: Medicare Other | Source: Ambulatory Visit

## 2011-08-16 ENCOUNTER — Encounter (HOSPITAL_COMMUNITY): Payer: Self-pay

## 2011-08-16 HISTORY — DX: Sleep apnea, unspecified: G47.30

## 2011-08-16 NOTE — Pre-Procedure Instructions (Signed)
20 [name]  08/16/2011   Your procedure is scheduled on: [id number]@ 9:30 AM  Report to [facility] at 7:30 AM.  Call this number if you have problems the morning of surgery: [phone]   Remember:   Do not eat food:After Midnight.  May have clear liquids: up to 4 Hours before arrival. Until 3:30 AM  Clear liquids include soda, tea, black coffee, apple or grape juice, broth.  Take these medicines the morning of surgery with A SIP OF WATERcelexa,valium as needed,neurontin,dilaudid as needed,levothyroxine,reglan,nasonex as needed,lyrica   Do not wear jewelry, make-up or nail polish.  Do not wear lotions, powders, or perfumes. You may wear deodorant.  Do not shave 48 hours prior to surgery. Men may shave face and neck.  Do not bring valuables to the hospital.  Contacts, dentures or bridgework may not be worn into surgery.  Leave suitcase in the car. After surgery it may be brought to your room.  For patients admitted to the hospital, checkout time is 11:00 AM the day of discharge.   Patients discharged the day of surgery will not be allowed to drive home.  Name and phone number of your driver:  Special Instructions: CHG Shower Use Special Wash: 1/2 bottle night before surgery and 1/2 bottle morning of surgery.   Please read over the following fact sheets that you were given: Pain Booklet, Coughing and Deep Breathing, MRSA Information and Surgical Site Infection Prevention

## 2011-08-27 ENCOUNTER — Encounter (HOSPITAL_COMMUNITY)
Admission: RE | Admit: 2011-08-27 | Discharge: 2011-08-27 | Disposition: A | Discharge status: Home / Self Care | Payer: Medicare Other | Source: Ambulatory Visit | Attending: Plastic Surgery | Admitting: Plastic Surgery

## 2011-08-27 ENCOUNTER — Encounter (HOSPITAL_COMMUNITY): Payer: Self-pay

## 2011-08-27 ENCOUNTER — Inpatient Hospital Stay (HOSPITAL_COMMUNITY): Admission: RE | Admit: 2011-08-27 | Payer: Medicare Other | Source: Ambulatory Visit

## 2011-08-27 HISTORY — DX: Cardiac arrhythmia, unspecified: I49.9

## 2011-08-27 HISTORY — DX: Anxiety disorder, unspecified: F41.9

## 2011-08-27 HISTORY — DX: Chronic kidney disease, unspecified: N18.9

## 2011-08-27 LAB — CBC
HCT: 45 % (ref 36.0–46.0)
Hemoglobin: 14.4 g/dL (ref 12.0–15.0)
MCH: 29.5 pg (ref 26.0–34.0)
MCHC: 32 g/dL (ref 30.0–36.0)
MCV: 92.2 fL (ref 78.0–100.0)
Platelets: 177 10*3/uL (ref 150–400)
RBC: 4.88 MIL/uL (ref 3.87–5.11)
RDW: 13.1 % (ref 11.5–15.5)
WBC: 10.5 10*3/uL (ref 4.0–10.5)

## 2011-08-27 LAB — SURGICAL PCR SCREEN
MRSA, PCR: NEGATIVE
Staphylococcus aureus: NEGATIVE

## 2011-08-27 LAB — BASIC METABOLIC PANEL
BUN: 21 mg/dL (ref 6–23)
CO2: 28 mEq/L (ref 19–32)
Calcium: 10.1 mg/dL (ref 8.4–10.5)
Chloride: 98 mEq/L (ref 96–112)
Creatinine, Ser: 0.9 mg/dL (ref 0.50–1.10)
GFR calc Af Amer: 89 mL/min — ABNORMAL LOW (ref >90)
GFR calc non Af Amer: 77 mL/min — ABNORMAL LOW (ref >90)
Glucose, Bld: 163 mg/dL — ABNORMAL HIGH (ref 70–99)
Potassium: 4.5 mEq/L (ref 3.5–5.1)
Sodium: 138 mEq/L (ref 135–145)

## 2011-08-27 LAB — HCG, SERUM, QUALITATIVE: Preg, Serum: NEGATIVE

## 2011-08-28 ENCOUNTER — Other Ambulatory Visit: Payer: Self-pay | Admitting: Plastic Surgery

## 2011-08-28 MED ORDER — CEFAZOLIN SODIUM-DEXTROSE 2-3 GM-% IV SOLR
2.0000 g | INTRAVENOUS | Status: DC
  Filled 2011-08-28: qty 50

## 2011-08-29 ENCOUNTER — Encounter (HOSPITAL_COMMUNITY): Payer: Self-pay | Admitting: Anesthesiology

## 2011-08-29 ENCOUNTER — Encounter (HOSPITAL_COMMUNITY)
Admission: RE | Disposition: A | Discharge status: Home / Self Care | Payer: Self-pay | Source: Ambulatory Visit | Attending: Plastic Surgery

## 2011-08-29 ENCOUNTER — Other Ambulatory Visit: Payer: Self-pay | Admitting: Plastic Surgery

## 2011-08-29 ENCOUNTER — Encounter (HOSPITAL_COMMUNITY): Payer: Self-pay | Admitting: Plastic Surgery

## 2011-08-29 ENCOUNTER — Ambulatory Visit (HOSPITAL_COMMUNITY): Payer: Medicare Other | Admitting: Anesthesiology

## 2011-08-29 ENCOUNTER — Ambulatory Visit (HOSPITAL_COMMUNITY)
Admission: RE | Admit: 2011-08-29 | Discharge: 2011-08-29 | Disposition: A | Discharge status: Home / Self Care | Payer: Medicare Other | Source: Ambulatory Visit | Attending: Plastic Surgery | Admitting: Plastic Surgery

## 2011-08-29 DIAGNOSIS — F411 Generalized anxiety disorder: Secondary | ICD-10-CM | POA: Insufficient documentation

## 2011-08-29 DIAGNOSIS — J45909 Unspecified asthma, uncomplicated: Secondary | ICD-10-CM | POA: Insufficient documentation

## 2011-08-29 DIAGNOSIS — IMO0002 Reserved for concepts with insufficient information to code with codable children: Secondary | ICD-10-CM

## 2011-08-29 DIAGNOSIS — F3289 Other specified depressive episodes: Secondary | ICD-10-CM | POA: Insufficient documentation

## 2011-08-29 DIAGNOSIS — I10 Essential (primary) hypertension: Secondary | ICD-10-CM | POA: Insufficient documentation

## 2011-08-29 DIAGNOSIS — L98499 Non-pressure chronic ulcer of skin of other sites with unspecified severity: Secondary | ICD-10-CM | POA: Insufficient documentation

## 2011-08-29 DIAGNOSIS — K219 Gastro-esophageal reflux disease without esophagitis: Secondary | ICD-10-CM | POA: Insufficient documentation

## 2011-08-29 DIAGNOSIS — L8993 Pressure ulcer of unspecified site, stage 3: Secondary | ICD-10-CM

## 2011-08-29 DIAGNOSIS — I499 Cardiac arrhythmia, unspecified: Secondary | ICD-10-CM | POA: Insufficient documentation

## 2011-08-29 DIAGNOSIS — E039 Hypothyroidism, unspecified: Secondary | ICD-10-CM | POA: Insufficient documentation

## 2011-08-29 DIAGNOSIS — L89209 Pressure ulcer of unspecified hip, unspecified stage: Secondary | ICD-10-CM

## 2011-08-29 DIAGNOSIS — E119 Type 2 diabetes mellitus without complications: Secondary | ICD-10-CM | POA: Insufficient documentation

## 2011-08-29 DIAGNOSIS — IMO0001 Reserved for inherently not codable concepts without codable children: Secondary | ICD-10-CM | POA: Insufficient documentation

## 2011-08-29 DIAGNOSIS — Z01812 Encounter for preprocedural laboratory examination: Secondary | ICD-10-CM | POA: Insufficient documentation

## 2011-08-29 DIAGNOSIS — G4730 Sleep apnea, unspecified: Secondary | ICD-10-CM | POA: Insufficient documentation

## 2011-08-29 DIAGNOSIS — F329 Major depressive disorder, single episode, unspecified: Secondary | ICD-10-CM | POA: Insufficient documentation

## 2011-08-29 DIAGNOSIS — R51 Headache: Secondary | ICD-10-CM | POA: Insufficient documentation

## 2011-08-29 HISTORY — PX: INCISION AND DRAINAGE OF WOUND: SHX1803

## 2011-08-29 LAB — GLUCOSE, CAPILLARY
Glucose-Capillary: 133 mg/dL — ABNORMAL HIGH (ref 70–99)
Glucose-Capillary: 112 mg/dL — ABNORMAL HIGH (ref 70–99)

## 2011-08-29 SURGERY — IRRIGATION AND DEBRIDEMENT WOUND
Anesthesia: General | Site: Hip | Laterality: Right | Wound class: Dirty or Infected

## 2011-08-29 MED ORDER — ONDANSETRON HCL 4 MG/2ML IJ SOLN: 4.0000 mg | Freq: Four times a day (QID) | INTRAMUSCULAR | Status: DC | PRN

## 2011-08-29 MED ORDER — GLYCOPYRROLATE 0.2 MG/ML IJ SOLN
INTRAMUSCULAR | Status: DC | PRN
  Administered 2011-08-29: 1 mg via INTRAVENOUS

## 2011-08-29 MED ORDER — MIDAZOLAM HCL 5 MG/5ML IJ SOLN
INTRAMUSCULAR | Status: DC | PRN
  Administered 2011-08-29: 2 mg via INTRAVENOUS

## 2011-08-29 MED ORDER — FENTANYL CITRATE 0.05 MG/ML IJ SOLN
INTRAMUSCULAR | Status: AC
  Filled 2011-08-29: qty 2

## 2011-08-29 MED ORDER — FENTANYL CITRATE 0.05 MG/ML IJ SOLN
INTRAMUSCULAR | Status: DC | PRN
  Administered 2011-08-29: 50 ug via INTRAVENOUS
  Administered 2011-08-29: 100 ug via INTRAVENOUS
  Administered 2011-08-29: 50 ug via INTRAVENOUS
  Administered 2011-08-29: 100 ug via INTRAVENOUS

## 2011-08-29 MED ORDER — SODIUM CHLORIDE 0.9 % IV SOLN
1500.0000 mg | INTRAVENOUS | Status: AC
  Administered 2011-08-29: 1500 mg via INTRAVENOUS
  Filled 2011-08-29: qty 1500

## 2011-08-29 MED ORDER — PROPOFOL 10 MG/ML IV EMUL
INTRAVENOUS | Status: DC | PRN
  Administered 2011-08-29: 300 mg via INTRAVENOUS
  Administered 2011-08-29: 50 mg via INTRAVENOUS

## 2011-08-29 MED ORDER — SODIUM CHLORIDE 0.9 % IR SOLN
Status: DC | PRN
  Administered 2011-08-29: 3000 mL

## 2011-08-29 MED ORDER — SUCCINYLCHOLINE CHLORIDE 20 MG/ML IJ SOLN
INTRAMUSCULAR | Status: DC | PRN
  Administered 2011-08-29: 180 mg via INTRAVENOUS

## 2011-08-29 MED ORDER — METHYLENE BLUE 1 % INJ SOLN
INTRAMUSCULAR | Status: DC | PRN
  Administered 2011-08-29: 10 mL

## 2011-08-29 MED ORDER — ONDANSETRON HCL 4 MG/2ML IJ SOLN
INTRAMUSCULAR | Status: DC | PRN
  Administered 2011-08-29 (×2): 4 mg via INTRAVENOUS

## 2011-08-29 MED ORDER — LACTATED RINGERS IV SOLN
INTRAVENOUS | Status: DC
  Administered 2011-08-29: 10:00:00 via INTRAVENOUS

## 2011-08-29 MED ORDER — HETASTARCH-ELECTROLYTES 6 % IV SOLN
INTRAVENOUS | Status: DC | PRN
  Administered 2011-08-29: 11:00:00 via INTRAVENOUS

## 2011-08-29 MED ORDER — LACTATED RINGERS IV SOLN
INTRAVENOUS | Status: DC | PRN
  Administered 2011-08-29 (×2): via INTRAVENOUS

## 2011-08-29 MED ORDER — SODIUM CHLORIDE 0.9 % IR SOLN
Status: DC | PRN
  Administered 2011-08-29: 1000 mL

## 2011-08-29 MED ORDER — ROCURONIUM BROMIDE 100 MG/10ML IV SOLN
INTRAVENOUS | Status: DC | PRN
  Administered 2011-08-29: 20 mg via INTRAVENOUS

## 2011-08-29 MED ORDER — FENTANYL CITRATE 0.05 MG/ML IJ SOLN
25.0000 ug | INTRAMUSCULAR | Status: DC | PRN
  Administered 2011-08-29 (×2): 25 ug via INTRAVENOUS

## 2011-08-29 MED ORDER — 0.9 % SODIUM CHLORIDE (POUR BTL) OPTIME
TOPICAL | Status: DC | PRN
  Administered 2011-08-29: 1000 mL

## 2011-08-29 MED ORDER — MIDAZOLAM HCL 2 MG/2ML IJ SOLN: 1.0000 mg | INTRAMUSCULAR | Status: DC | PRN

## 2011-08-29 MED ORDER — SODIUM CHLORIDE 0.9 % IR SOLN
Status: DC | PRN
  Administered 2011-08-29: 11:00:00

## 2011-08-29 MED ORDER — NEOSTIGMINE METHYLSULFATE 1 MG/ML IJ SOLN
INTRAMUSCULAR | Status: DC | PRN
  Administered 2011-08-29: 5 mg via INTRAVENOUS

## 2011-08-29 MED ORDER — LIDOCAINE HCL (CARDIAC) 20 MG/ML IV SOLN
INTRAVENOUS | Status: DC | PRN
  Administered 2011-08-29: 60 mg via INTRAVENOUS

## 2011-08-29 SURGICAL SUPPLY — 53 items
APL SKNCLS STERI-STRIP NONHPOA (GAUZE/BANDAGES/DRESSINGS) ×1
APPLICATOR COTTON TIP 6IN STRL (MISCELLANEOUS) ×1 IMPLANT
BAG DECANTER FOR FLEXI CONT (MISCELLANEOUS) ×1 IMPLANT
BANDAGE GAUZE ELAST BULKY 4 IN (GAUZE/BANDAGES/DRESSINGS) IMPLANT
BENZOIN TINCTURE PRP APPL 2/3 (GAUZE/BANDAGES/DRESSINGS) ×1 IMPLANT
BLADE SURG ROTATE 9660 (MISCELLANEOUS) IMPLANT
CANISTER SUCTION 2500CC (MISCELLANEOUS) ×2 IMPLANT
CHLORAPREP W/TINT 26ML (MISCELLANEOUS) IMPLANT
CLOTH BEACON ORANGE TIMEOUT ST (SAFETY) ×2 IMPLANT
CONT SPECI 4OZ STER CLIK (MISCELLANEOUS) ×1 IMPLANT
COVER SURGICAL LIGHT HANDLE (MISCELLANEOUS) ×2 IMPLANT
DRAPE LAPAROTOMY T 102X78X121 (DRAPES) ×1 IMPLANT
DRAPE PED LAPAROTOMY (DRAPES) ×2 IMPLANT
DRAPE PROXIMA HALF (DRAPES) IMPLANT
DRSG ADAPTIC 3X8 NADH LF (GAUZE/BANDAGES/DRESSINGS) ×1 IMPLANT
DRSG PAD ABDOMINAL 8X10 ST (GAUZE/BANDAGES/DRESSINGS) ×2 IMPLANT
ELECT BLADE 6.5 EXT (BLADE) ×1 IMPLANT
ELECT CAUTERY BLADE 6.4 (BLADE) ×2 IMPLANT
ELECT REM PT RETURN 9FT ADLT (ELECTROSURGICAL) ×2
ELECTRODE REM PT RTRN 9FT ADLT (ELECTROSURGICAL) ×1 IMPLANT
GEL ULTRASOUND 20GR AQUASONIC (MISCELLANEOUS) ×1 IMPLANT
GLOVE BIO SURGEON STRL SZ 6.5 (GLOVE) ×7 IMPLANT
GLOVE BIOGEL PI IND STRL 7.0 (GLOVE) IMPLANT
GLOVE BIOGEL PI IND STRL 8 (GLOVE) IMPLANT
GLOVE BIOGEL PI INDICATOR 7.0 (GLOVE) ×1
GLOVE BIOGEL PI INDICATOR 8 (GLOVE) ×2
GLOVE SURG SS PI 6.5 STRL IVOR (GLOVE) ×1 IMPLANT
GLOVE SURG SS PI 7.5 STRL IVOR (GLOVE) ×1 IMPLANT
GOWN BRE IMP PREV XXLGXLNG (GOWN DISPOSABLE) ×1 IMPLANT
GOWN STRL NON-REIN LRG LVL3 (GOWN DISPOSABLE) ×7 IMPLANT
HANDPIECE INTERPULSE COAX TIP (DISPOSABLE) ×2
KIT BASIN OR (CUSTOM PROCEDURE TRAY) ×2 IMPLANT
KIT ROOM TURNOVER OR (KITS) ×2 IMPLANT
MANIFOLD NEPTUNE WASTE (CANNULA) ×1 IMPLANT
MATRIX SURGICAL PSM 7X10CM (Tissue) ×1 IMPLANT
MICROMATRIX 500MG (Tissue) ×4 IMPLANT
NDL 18GX1X1/2 (RX/OR ONLY) (NEEDLE) IMPLANT
NEEDLE 18GX1X1/2 (RX/OR ONLY) (NEEDLE) ×2 IMPLANT
NS IRRIG 1000ML POUR BTL (IV SOLUTION) ×3 IMPLANT
PACK GENERAL/GYN (CUSTOM PROCEDURE TRAY) ×2 IMPLANT
PAD ARMBOARD 7.5X6 YLW CONV (MISCELLANEOUS) ×4 IMPLANT
SET HNDPC FAN SPRY TIP SCT (DISPOSABLE) IMPLANT
SOLUTION PARTIC MCRMTRX 500MG (Tissue) IMPLANT
SPONGE GAUZE 4X4 12PLY (GAUZE/BANDAGES/DRESSINGS) ×3 IMPLANT
STAPLER VISISTAT 35W (STAPLE) ×1 IMPLANT
SWAB COLLECTION DEVICE MRSA (MISCELLANEOUS) IMPLANT
SYR 5ML LL (SYRINGE) ×1 IMPLANT
SYR CONTROL 10ML LL (SYRINGE) ×1 IMPLANT
TAPE CLOTH SURG 4X10 WHT LF (GAUZE/BANDAGES/DRESSINGS) ×1 IMPLANT
TOWEL OR 17X24 6PK STRL BLUE (TOWEL DISPOSABLE) ×3 IMPLANT
TOWEL OR 17X26 10 PK STRL BLUE (TOWEL DISPOSABLE) ×2 IMPLANT
TUBE ANAEROBIC SPECIMEN COL (MISCELLANEOUS) IMPLANT
UNDERPAD 30X30 INCONTINENT (UNDERPADS AND DIAPERS) ×1 IMPLANT

## 2011-08-29 NOTE — Anesthesia Preprocedure Evaluation (Addendum)
Anesthesia Evaluation  Patient identified by MRN, date of birth, ID band Patient awake    Reviewed: Allergy & Precautions, H&P , NPO status , Patient's Chart, lab work & pertinent test results, reviewed documented beta blocker date and time   History of Anesthesia Complications (+) PONV  Airway Mallampati: II  Neck ROM: full    Dental  (+) Teeth Intact and Dental Advisory Given   Pulmonary shortness of breath and Long-Term Oxygen Therapy, asthma , sleep apnea and Oxygen sleep apnea ,          Cardiovascular hypertension, Pt. on medications + dysrhythmias     Neuro/Psych  Headaches, PSYCHIATRIC DISORDERS Anxiety Depression  Neuromuscular disease    GI/Hepatic GERD-  Medicated,  Endo/Other  Diabetes mellitus-, Poorly Controlled, Type 1, Insulin DependentHypothyroidism Morbid obesity  Renal/GU      Musculoskeletal  (+) Fibromyalgia -  Abdominal   Peds  Hematology   Anesthesia Other Findings   Reproductive/Obstetrics                          Anesthesia Physical Anesthesia Plan  ASA: III  Anesthesia Plan: General   Post-op Pain Management:    Induction: Intravenous  Airway Management Planned: Oral ETT  Additional Equipment:   Intra-op Plan:   Post-operative Plan: Extubation in OR  Informed Consent: I have reviewed the patients History and Physical, chart, labs and discussed the procedure including the risks, benefits and alternatives for the proposed anesthesia with the patient or authorized representative who has indicated his/her understanding and acceptance.   Dental advisory given  Plan Discussed with: CRNA and Surgeon  Anesthesia Plan Comments:        Anesthesia Quick Evaluation

## 2011-08-29 NOTE — Anesthesia Postprocedure Evaluation (Signed)
Anesthesia Post Note  Patient: [name]  Procedure(s) Performed: Procedure(s) (LRB): IRRIGATION AND DEBRIDEMENT WOUND (Right)  Anesthesia type: General  Patient location: PACU  Post pain: Pain level controlled and Adequate analgesia  Post assessment: Post-op Vital signs reviewed, Patient's Cardiovascular Status Stable, Respiratory Function Stable, Patent Airway and Pain level controlled  Last Vitals:  Filed Vitals:   08/29/11 1135  BP:   Pulse:   Temp: 37.1 C  Resp:     Post vital signs: Reviewed and stable  Level of consciousness: awake, alert  and oriented  Complications: No apparent anesthesia complications

## 2011-08-29 NOTE — Op Note (Signed)
NAMEALIZEY, NOREN NO.:  [id number]  MEDICAL RECORD NO.:  [id number]  LOCATION:  [facility]                         FACILITY:  [facility]  PHYSICIAN:  Wayland Denis, DO      DATE OF BIRTH:  1970-12-25  DATE OF PROCEDURE:  08/29/2011 DATE OF DISCHARGE:                              OPERATIVE REPORT   PREOPERATIVE DIAGNOSIS:  Right hip ulcer.  POSTOPERATIVE DIAGNOSIS:  Right hip ulcer.  PROCEDURE:  Irrigation and debridement of right hip ulcer with skin, subcutaneous tissue, and muscle, and placement of ACell 500 mg x2 within an ACell sheet.  Size is 3.5 x 3  x 6 cm.  ATTENDING:  Wayland Denis, DO  ASSISTANT:  Lazaro Arms, PA  ANESTHESIA:  General.  INDICATION FOR PROCEDURE:  The patient is a 41 year old female who has had a hip ulcer on the right for an extended period of time.  She has been treated by different physician with some ACell placement.  There was a lot of fibrous tissue that was tracking down about 6 cm.  The decision was made to bring her to the operating room for excision and debridement with placement of ACell.  DESCRIPTION OF PROCEDURE:  The patient was taken to the operating room, placed on the operating room table in supine position.  General anesthesia was administered.  Once adequate, a time-out was called.  All information was confirmed to be correct.  She was prepped and draped in the usual sterile fashion and she was placed on the left lateral position.  The knife was used to start debridement of the skin, realized that there was a quite a bit of tracking.  Therefore, methylene blue was placed and after waiting several minutes for the methylene blue to set, the Bovie was used to dissect around the subcutaneous fibrous tissue and down to including portion of muscle.  This was debrided and a portion was sent for Gram stain, culture and sensitivity.  Hemostasis was achieved with electrocautery.  Two bottles of 500 mg ACell powder  was placed and an ACell sheet that was placed after copious amounts of normal saline irrigation and antibiotic solution was used to irrigate the wound.  An Adaptic was placed with Hydrogel over it for keeping it hydrated with some dry gauze on the outside for the dressing.  The patient tolerated the procedure well. There were no complications.  She was awoken and taken to recovery room in stable condition.     Wayland Denis, DO     CS/MEDQ  D:  08/29/2011  T:  08/29/2011  Job:  [id number]

## 2011-08-29 NOTE — Brief Op Note (Signed)
08/29/2011  11:10 AM  PATIENT:  [name]  41 y.o. female  PRE-OPERATIVE DIAGNOSIS:  RIGHT HIP ULCER  POST-OPERATIVE DIAGNOSIS:  RIGHT HIP ULCER  PROCEDURE:  Procedure(s) (LRB): IRRIGATION AND DEBRIDEMENT WOUND (Right) WITH PLACEMENT OF ACELL  SURGEON:  Surgeon(s) and Role:    * Shannan Slinker Sanger, DO - Primary  PHYSICIAN ASSISTANT:   ASSISTANTS: Shawn Rayburn, PA   ANESTHESIA:   general  EBL:  Total I/O In: 1500 [I.V.:1000; IV Piggyback:500] Out: -   BLOOD ADMINISTERED:none  DRAINS: none   LOCAL MEDICATIONS USED:  NONE  SPECIMEN:  Source of Specimen:  right hip ulcer  DISPOSITION OF SPECIMEN:  microbiology  COUNTS:  YES  TOURNIQUET:  * No tourniquets in log *  DICTATION: dictated  PLAN OF CARE: Discharge to home after PACU  PATIENT DISPOSITION:  PACU - hemodynamically stable.   Delay start of Pharmacological VTE agent (>24hrs) due to surgical blood loss or risk of bleeding: yes

## 2011-08-29 NOTE — Anesthesia Procedure Notes (Signed)
Procedure Name: Intubation Date/Time: 08/29/2011 10:07 AM Performed by: Garen Lah Pre-anesthesia Checklist: Patient identified, Timeout performed, Emergency Drugs available, Suction available and Patient being monitored Patient Re-evaluated:Patient Re-evaluated prior to inductionOxygen Delivery Method: Circle system utilized Preoxygenation: Pre-oxygenation with 100% oxygen Intubation Type: IV induction Ventilation: Mask ventilation without difficulty Laryngoscope Size: Mac and 3 Grade View: Grade I Tube type: Oral Tube size: 7.5 mm Number of attempts: 1 Airway Equipment and Method: Stylet Placement Confirmation: ETT inserted through vocal cords under direct vision,  breath sounds checked- equal and bilateral and positive ETCO2 Secured at: 21 cm Tube secured with: Tape Dental Injury: Teeth and Oropharynx as per pre-operative assessment

## 2011-08-29 NOTE — Preoperative (Signed)
Beta Blockers   Reason not to administer Beta Blockers:Not Applicable. No home beta blockers 

## 2011-08-29 NOTE — Transfer of Care (Signed)
Immediate Anesthesia Transfer of Care Note  Patient: [name]  Procedure(s) Performed: Procedure(s) (LRB): IRRIGATION AND DEBRIDEMENT WOUND (Right)  Patient Location: PACU  Anesthesia Type: General  Level of Consciousness: awake, alert  and oriented  Airway & Oxygen Therapy: Patient Spontanous Breathing and Patient connected to face mask oxygen  Post-op Assessment: Report given to PACU RN and Post -op Vital signs reviewed and stable  Post vital signs: Reviewed and stable  Complications: No apparent anesthesia complications

## 2011-08-29 NOTE — Progress Notes (Signed)
Pt is currently an active pt with [place] [facility] and chooses to con't care with [place].  Home health request faxed to [facility] regarding wound care and PT.

## 2011-08-29 NOTE — H&P (Signed)
[name] is an 41 y.o. female.   Chief Complaint: right hip ulcer HPI: The patient is a 41 yrs old wf here with her husband for debridement of the right hip ulcer.  She has been dealing with this area for several months.  Acell has been placed in the wound several times with small improvements.  There is fibrous tissue at the base and around the wound edges prohibiting healing and granulations.  Past Medical History  Diagnosis Date  . Diabetes mellitus   . Hypertension   . Wound healing, delayed   . H/O: Bell's palsy 2011  . Depression   . Fibromyalgia   . DJD (degenerative joint disease)   . Asthma   . Right foot drop   . Hypoventilation associated with obesity syndrome   . Hypothyroidism   . Back pain   . GERD (gastroesophageal reflux disease)   . Peripheral vascular disease   . Heart murmur   . Shortness of breath     with Activity  . Chronic pain syndrome   . Non-healing non-surgical wound     Right hip, has Wound vac to hip.  Started as a skin tear.  . Chronic heel ulcer   . Blood transfusion   . Migraine   . History of MRSA infection OF ULCER  . PONV (postoperative nausea and vomiting)   . Dysrhythmia   . Sleep apnea     "study shows not bad enough for [facility]."  . Chronic kidney disease     Kidney Stone  . Anxiety     Past Surgical History  Procedure Date  . Right elbow   . Back surgery     for lumbar disc disease X2  . 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 Cholecystectomy 1984  . I&d extremity 04/02/2011    Procedure: IRRIGATION AND DEBRIDEMENT EXTREMITY;  Surgeon: Kathryne Hitch;  Location: [facility] OR;  Service: Orthopedics;  Laterality: Right;  I&D right heel ulcer, placement of A-cell graft  . Brain surgery      Plating due to soft spot closing too early- age 14  . Lithotripsy     2007ish    Family History  Problem Relation Age of Onset  . Diabetes type II Father   . Diabetes type II Mother   . Anesthesia problems Mother      Social History:  reports that she has never smoked. She has never used smokeless tobacco. She reports that she does not drink alcohol or use illicit drugs.  Allergies:  Allergies  Allergen Reactions  . Sulfa Antibiotics Hives    Medications Prior to Admission  Medication Sig Dispense Refill  . atorvastatin (LIPITOR) 10 MG tablet Take 10 mg by mouth daily.        . Biotin 5000 MCG CAPS Take 2 capsules by mouth 2 (two) times daily.        . carisoprodol (SOMA) 350 MG tablet Take 350 mg by mouth 4 (four) times daily as needed. For back spasms.      . celecoxib (CELEBREX) 400 MG capsule Take 400 mg by mouth 2 (two) times daily.        . citalopram (CELEXA) 20 MG tablet Take 60 mg by mouth daily.       . Cyanocobalamin (VITAMIN B-12 IJ) Inject 100 Units as directed every 30 (thirty) days.       . diazepam (VALIUM) 5 MG tablet Take 10 mg by mouth 4 (  four) times daily as needed. Anxiety/sleep       . esomeprazole (NEXIUM) 40 MG capsule Take 40 mg by mouth daily before breakfast.       . fentaNYL (DURAGESIC - DOSED MCG/HR) 100 MCG/HR Place 4 patches onto the skin every 3 (three) days.       Marland Kitchen gabapentin (NEURONTIN) 300 MG capsule Take 300-600 mg by mouth 2 (two) times daily. Take 600 MG in the morning, and 300 MG at night.      Marland Kitchen GARLIC PO Take 1 tablet by mouth daily.        Marland Kitchen HYDROmorphone (DILAUDID) 8 MG tablet Take 8 mg by mouth every 4 (four) hours as needed. Pain       . insulin glargine (LANTUS) 100 UNIT/ML injection Inject 85 Units into the skin at bedtime.       . Iron Combinations (IRON COMPLEX PO) Take 2 tablets by mouth 2 (two) times daily.        Marland Kitchen levothyroxine (SYNTHROID, LEVOTHROID) 125 MCG tablet Take 125 mcg by mouth daily.      . Liraglutide (VICTOZA) 18 MG/3ML SOLN Inject 1.8 mg into the skin every morning.       . metoCLOPramide (REGLAN) 10 MG tablet Take 10 mg by mouth every 8 (eight) hours.        . mometasone (NASONEX) 50 MCG/ACT nasal spray Place 2 sprays into the  nose daily as needed. For allergies.      . Multiple Vitamins-Minerals (MULTIVITAMINS THER. W/MINERALS) TABS Take 1 tablet by mouth daily.        Marland Kitchen nystatin (MYCOSTATIN/NYSTOP) 100000 UNIT/GM POWD Apply 1 g topically 2 (two) times daily.      Marland Kitchen oxyCODONE-acetaminophen (PERCOCET) 10-325 MG per tablet Take 2 tablets by mouth every 4 (four) hours.       . pregabalin (LYRICA) 75 MG capsule Take 75 mg by mouth 3 (three) times daily.       . sitaGLIPtan-metformin (JANUMET) 50-1000 MG per tablet Take 1 tablet by mouth 2 (two) times daily with a meal.       . VITAMIN D, CHOLECALCIFEROL, PO Take 1 capsule by mouth daily.         Results for orders placed during the hospital encounter of 08/27/11 (from the past 48 hour(s))  BASIC METABOLIC PANEL     Status: Abnormal   Collection Time   08/27/11  4:37 PM      Component Value Range Comment   Sodium 138  135 - 145 (mEq/L)    Potassium 4.5  3.5 - 5.1 (mEq/L)    Chloride 98  96 - 112 (mEq/L)    CO2 28  19 - 32 (mEq/L)    Glucose, Bld 163 (*) 70 - 99 (mg/dL)    BUN 21  6 - 23 (mg/dL)    Creatinine, Ser 1.61  0.50 - 1.10 (mg/dL)    Calcium 09.6  8.4 - 10.5 (mg/dL)    GFR calc non Af Amer 77 (*) >90 (mL/min)    GFR calc Af Amer 89 (*) >90 (mL/min)   CBC     Status: Normal   Collection Time   08/27/11  4:37 PM      Component Value Range Comment   WBC 10.5  4.0 - 10.5 (K/uL)    RBC 4.88  3.87 - 5.11 (MIL/uL)    Hemoglobin 14.4  12.0 - 15.0 (g/dL)    HCT 04.5  40.9 - 81.1 (%)    MCV 92.2  78.0 - 100.0 (fL)    MCH 29.5  26.0 - 34.0 (pg)    MCHC 32.0  30.0 - 36.0 (g/dL)    RDW 45.4  09.8 - 11.9 (%)    Platelets 177  150 - 400 (K/uL)   SURGICAL PCR SCREEN     Status: Normal   Collection Time   08/27/11  4:39 PM      Component Value Range Comment   MRSA, PCR NEGATIVE  NEGATIVE     Staphylococcus aureus NEGATIVE  NEGATIVE    HCG, SERUM, QUALITATIVE     Status: Normal   Collection Time   08/27/11  4:45 PM      Component Value Range Comment   Preg, Serum  NEGATIVE  NEGATIVE     No results found.  Review of Systems  Constitutional: Negative.   HENT: Negative.   Eyes: Negative.   Respiratory: Negative.   Cardiovascular: Negative.   Gastrointestinal: Negative.   Genitourinary: Negative.   Musculoskeletal: Negative.   Skin: Negative.   Neurological: Negative.   Endo/Heme/Allergies: Negative.   Psychiatric/Behavioral: Negative.     Blood pressure 107/75, pulse 81, temperature 99.2 F (37.3 C), temperature source Oral, resp. rate 18, last menstrual period 08/28/2009, SpO2 91.00%. Physical Exam  Constitutional: She appears well-developed and well-nourished.  HENT:  Head: Normocephalic and atraumatic.  Eyes: Pupils are equal, round, and reactive to light.  Neck: Normal range of motion.  Cardiovascular: Normal rate.   Respiratory: Effort normal.  GI: Soft.  Musculoskeletal: Normal range of motion.  Neurological: She is alert.  Psychiatric: She has a normal mood and affect. Her behavior is normal. Judgment and thought content normal.     Assessment/Plan Right hip ulcer - irrigation and debridement of the area with placement of Acell and the VAC. Risks and complications were reviewed and include bleeding, pain, scar and risk of anesthesia.  SANGER,Yarima Penman 08/29/2011, 8:44 AM

## 2011-08-30 ENCOUNTER — Encounter (HOSPITAL_COMMUNITY): Payer: Self-pay | Admitting: Plastic Surgery

## 2011-09-01 LAB — TISSUE CULTURE
Culture: NO GROWTH
Gram Stain: NONE SEEN

## 2011-09-09 ENCOUNTER — Encounter (HOSPITAL_BASED_OUTPATIENT_CLINIC_OR_DEPARTMENT_OTHER): Payer: Medicare Other | Attending: Plastic Surgery

## 2011-09-09 DIAGNOSIS — L98499 Non-pressure chronic ulcer of skin of other sites with unspecified severity: Secondary | ICD-10-CM | POA: Insufficient documentation

## 2011-09-09 DIAGNOSIS — E119 Type 2 diabetes mellitus without complications: Secondary | ICD-10-CM | POA: Insufficient documentation

## 2011-09-09 DIAGNOSIS — I10 Essential (primary) hypertension: Secondary | ICD-10-CM | POA: Insufficient documentation

## 2011-09-09 DIAGNOSIS — E039 Hypothyroidism, unspecified: Secondary | ICD-10-CM | POA: Insufficient documentation

## 2011-09-09 DIAGNOSIS — Z79899 Other long term (current) drug therapy: Secondary | ICD-10-CM | POA: Insufficient documentation

## 2011-09-09 NOTE — Progress Notes (Signed)
[facility]  NAME:  [name], [name]             ACCOUNT NO.:  [id number]  MEDICAL RECORD NO.:  [id number]      DATE OF BIRTH:  [birth year]  PHYSICIAN:  Wayland Denis, DO       VISIT DATE:  09/09/2011                                  OFFICE VISIT   The patient is a 41 year old female who is here for follow up after surgical debridement of her right hip ulcer.  She had A-Cell placed with Adaptic and Hydrogel.  She has done pretty well over the last week and half and has been changing the outer dressing.  There has been no change in her medications or social history.  PHYSICAL EXAMINATION:  GENERAL:  She is alert, oriented, cooperative, not in any acute distress.  She is accompanied by her husband.  She is pleasant. HEENT:  Pupils are equal.  Extraocular muscles are intact. NECK:  No cervical lymphadenopathy. LUNGS:  Her breathing is unlabored. CARDIAC:  Her heart is regular. ABDOMEN:  Her abdomen is soft and nontender, but large.  The wound has started granulating.  The A-Cell looks like it is incorporated extremely well.  There is some drainage.  I think it is possible that the A-Cell is still incorporating.  So, I would like to continue with the Hydrogel soaked gauze for the next week and then, we will likely switch to wet-to-dry dressing.     Wayland Denis, DO     CS/MEDQ  D:  09/09/2011  T:  09/09/2011  Job:  [id number]

## 2011-09-16 NOTE — Progress Notes (Signed)
[facility]  NAME:  [name], [name]             ACCOUNT NO.:  [id number]  MEDICAL RECORD NO.:  [id number]      DATE OF BIRTH:  [birth year]  PHYSICIAN:  Wayland Denis, DO       VISIT DATE:  09/16/2011                                  OFFICE VISIT   The patient is a 41 year old female, who is here for followup after surgical excision and debridement of her right hip ulcer.  She had ACell placed and was using Hydrogel gauze dressing changes.  She has done well over the past couple of weeks and there is no significant change. Overall, the wound does appear to be improving.  No change in medications or social history.  She is here with her husband who is very supportive.  PHYSICAL EXAMINATION:  GENERAL:  She is alert, oriented, cooperative, not in any acute distress.  She is pleasant. EYES:  Pupils equal.  Extraocular muscles intact. NECK:  No cervical lymphadenopathy. LUNGS:  Her breathing is unlabored. HEART:  Regular.  We recommend continuing with dressing changes, wet to dries now, and will apply for the VAC.     Wayland Denis, DO     CS/MEDQ  D:  09/16/2011  T:  09/16/2011  Job:  [id number]

## 2011-09-23 ENCOUNTER — Encounter (HOSPITAL_BASED_OUTPATIENT_CLINIC_OR_DEPARTMENT_OTHER): Payer: Medicare Other | Attending: Plastic Surgery

## 2011-09-23 DIAGNOSIS — L98499 Non-pressure chronic ulcer of skin of other sites with unspecified severity: Secondary | ICD-10-CM | POA: Insufficient documentation

## 2011-10-07 ENCOUNTER — Encounter (HOSPITAL_BASED_OUTPATIENT_CLINIC_OR_DEPARTMENT_OTHER): Payer: Medicare Other

## 2011-10-08 NOTE — Progress Notes (Signed)
[facility]  NAME:  [name], [name]             ACCOUNT NO.:  [id number]  MEDICAL RECORD NO.:  [id number]      DATE OF BIRTH:  [birth year]  PHYSICIAN:  Wayland Denis, DO       VISIT DATE:  10/07/2011                                  OFFICE VISIT   The patient is a 41 year old female who is here with her husband for followup on her right hip ulcer.  She has been using the [facility] and it does appear to be improving.  There is no fibrous tissue or exudate.  The area it is decreasing in circumference as well as in depth, and she has good granulation tissue.  We will continue with the [facility] and have her followup in 3 weeks.     Alan Ripper Sanger, DO     CS/MEDQ  D:  10/07/2011  T:  10/08/2011  Job:  [id number]

## 2011-10-28 ENCOUNTER — Encounter (HOSPITAL_BASED_OUTPATIENT_CLINIC_OR_DEPARTMENT_OTHER): Payer: Medicare Other | Attending: Plastic Surgery

## 2011-10-28 DIAGNOSIS — L98499 Non-pressure chronic ulcer of skin of other sites with unspecified severity: Secondary | ICD-10-CM | POA: Insufficient documentation

## 2011-10-29 NOTE — Progress Notes (Signed)
[facility]  NAME:  KOURTNEY, TERRIQUEZ             ACCOUNT NO.:  [id number]  MEDICAL RECORD NO.:  [id number]      DATE OF BIRTH:  26-Jan-1971  PHYSICIAN:  Wayland Denis, DO       VISIT DATE:  10/28/2011                                  OFFICE VISIT   The patient is a 41 year old female who is here for followup on her right hip ulcer.  She underwent irrigation and debridement with placement of VAC.  She has done extremely well with marked decrease in her depths.  There has been no change in her medications or social history.  On exam, she is alert, oriented, cooperative.  The ulcer has markedly improved in depth and in circumference.  We will continue with the VAC. If we need to we will place more A-Cell, but for right now, continue VAC, protein intake, and weight loss.     Wayland Denis, DO     CS/MEDQ  D:  10/28/2011  T:  10/29/2011  Job:  [id number]

## 2011-11-19 NOTE — Progress Notes (Signed)
[facility]  NAME:  [name], [name]             ACCOUNT NO.:  [id number]  MEDICAL RECORD NO.:  [id number]      DATE OF BIRTH:  [birth year]  PHYSICIAN:  Wayland Denis, DO       VISIT DATE:  11/18/2011                                  OFFICE VISIT   The patient is a 41 year old female, who is here for follow up on her right ischial hip ulcer.  She has done extremely well with remarkable healing over the last several weeks.  This is likely due to offloading and healthier eating.  The area looks like it is granulating and healing.  There is no sign of infection.  No fibrous tissue.  We recommend continuing with the [facility] and have her follow up in 1 month.     Wayland Denis, DO     CS/MEDQ  D:  11/18/2011  T:  11/19/2011  Job:  [id number]

## 2011-12-02 DIAGNOSIS — F1120 Opioid dependence, uncomplicated: Secondary | ICD-10-CM | POA: Insufficient documentation

## 2011-12-30 ENCOUNTER — Encounter (HOSPITAL_BASED_OUTPATIENT_CLINIC_OR_DEPARTMENT_OTHER): Payer: Medicare Other | Attending: Plastic Surgery

## 2011-12-30 DIAGNOSIS — L98499 Non-pressure chronic ulcer of skin of other sites with unspecified severity: Secondary | ICD-10-CM | POA: Insufficient documentation

## 2011-12-31 NOTE — Progress Notes (Signed)
[facility]  NAME:  [name], [name]             ACCOUNT NO.:  [id number]  MEDICAL RECORD NO.:  [id number]      DATE OF BIRTH:  [birth year]  PHYSICIAN:  Wayland Denis, DO       VISIT DATE:  12/30/2011                                  OFFICE VISIT   The patient is a 41 year old female who is here for followup on her right hip ulcer.  She is still using the [facility] and has healed remarkably well.  It is a fraction of its original size.  The sizes are noted in the nurse's note.  She does not have any signs of infection.  The periwound area looks good.  There is minimal drainage, no malodor border and no change in her medication or social history.  We will continue with the VAC.  We will put an Endoform under there today and we will have Home Health to collagen, and we will see her back in few weeks.     Wayland Denis, DO     CS/MEDQ  D:  12/30/2011  T:  12/31/2011  Job:  [id number]

## 2012-01-27 ENCOUNTER — Encounter (HOSPITAL_BASED_OUTPATIENT_CLINIC_OR_DEPARTMENT_OTHER): Payer: Medicare Other

## 2012-02-03 ENCOUNTER — Encounter (HOSPITAL_BASED_OUTPATIENT_CLINIC_OR_DEPARTMENT_OTHER): Payer: Medicare Other | Attending: Plastic Surgery

## 2012-02-03 DIAGNOSIS — L8992 Pressure ulcer of unspecified site, stage 2: Secondary | ICD-10-CM | POA: Insufficient documentation

## 2012-02-03 DIAGNOSIS — L89209 Pressure ulcer of unspecified hip, unspecified stage: Secondary | ICD-10-CM | POA: Insufficient documentation

## 2012-09-02 ENCOUNTER — Inpatient Hospital Stay (HOSPITAL_BASED_OUTPATIENT_CLINIC_OR_DEPARTMENT_OTHER)
Admission: EM | Admit: 2012-09-02 | Discharge: 2012-09-06 | DRG: 683 | Disposition: A | Discharge status: Home / Self Care | Payer: Medicare Other | Attending: Internal Medicine | Admitting: Internal Medicine

## 2012-09-02 ENCOUNTER — Encounter (HOSPITAL_BASED_OUTPATIENT_CLINIC_OR_DEPARTMENT_OTHER): Payer: Self-pay | Admitting: Family Medicine

## 2012-09-02 DIAGNOSIS — E662 Morbid (severe) obesity with alveolar hypoventilation: Secondary | ICD-10-CM | POA: Diagnosis present

## 2012-09-02 DIAGNOSIS — N179 Acute kidney failure, unspecified: Secondary | ICD-10-CM

## 2012-09-02 DIAGNOSIS — K429 Umbilical hernia without obstruction or gangrene: Secondary | ICD-10-CM | POA: Diagnosis present

## 2012-09-02 DIAGNOSIS — E1165 Type 2 diabetes mellitus with hyperglycemia: Secondary | ICD-10-CM | POA: Diagnosis present

## 2012-09-02 DIAGNOSIS — E039 Hypothyroidism, unspecified: Secondary | ICD-10-CM

## 2012-09-02 DIAGNOSIS — G8929 Other chronic pain: Secondary | ICD-10-CM | POA: Diagnosis present

## 2012-09-02 DIAGNOSIS — F411 Generalized anxiety disorder: Secondary | ICD-10-CM | POA: Diagnosis present

## 2012-09-02 DIAGNOSIS — E118 Type 2 diabetes mellitus with unspecified complications: Secondary | ICD-10-CM | POA: Diagnosis present

## 2012-09-02 DIAGNOSIS — E119 Type 2 diabetes mellitus without complications: Secondary | ICD-10-CM | POA: Diagnosis present

## 2012-09-02 DIAGNOSIS — Z6841 Body Mass Index (BMI) 40.0 and over, adult: Secondary | ICD-10-CM

## 2012-09-02 DIAGNOSIS — IMO0002 Reserved for concepts with insufficient information to code with codable children: Secondary | ICD-10-CM

## 2012-09-02 DIAGNOSIS — R34 Anuria and oliguria: Secondary | ICD-10-CM

## 2012-09-02 DIAGNOSIS — I739 Peripheral vascular disease, unspecified: Secondary | ICD-10-CM | POA: Diagnosis present

## 2012-09-02 DIAGNOSIS — Z8614 Personal history of Methicillin resistant Staphylococcus aureus infection: Secondary | ICD-10-CM

## 2012-09-02 DIAGNOSIS — I10 Essential (primary) hypertension: Secondary | ICD-10-CM | POA: Diagnosis present

## 2012-09-02 DIAGNOSIS — M129 Arthropathy, unspecified: Secondary | ICD-10-CM | POA: Diagnosis present

## 2012-09-02 DIAGNOSIS — IMO0001 Reserved for inherently not codable concepts without codable children: Secondary | ICD-10-CM | POA: Diagnosis present

## 2012-09-02 DIAGNOSIS — Z794 Long term (current) use of insulin: Secondary | ICD-10-CM

## 2012-09-02 DIAGNOSIS — G4730 Sleep apnea, unspecified: Secondary | ICD-10-CM | POA: Diagnosis present

## 2012-09-02 DIAGNOSIS — Z79899 Other long term (current) drug therapy: Secondary | ICD-10-CM

## 2012-09-02 DIAGNOSIS — G894 Chronic pain syndrome: Secondary | ICD-10-CM | POA: Diagnosis present

## 2012-09-02 HISTORY — DX: Acute kidney failure, unspecified: N17.9

## 2012-09-02 LAB — BASIC METABOLIC PANEL
BUN: 64 mg/dL — ABNORMAL HIGH (ref 6–23)
CO2: 19 mEq/L (ref 19–32)
Calcium: 8.8 mg/dL (ref 8.4–10.5)
Chloride: 102 mEq/L (ref 96–112)
Creatinine, Ser: 7.5 mg/dL — ABNORMAL HIGH (ref 0.50–1.10)
GFR calc Af Amer: 7 mL/min — ABNORMAL LOW (ref >90)
GFR calc non Af Amer: 6 mL/min — ABNORMAL LOW (ref >90)
Glucose, Bld: 131 mg/dL — ABNORMAL HIGH (ref 70–99)
Potassium: 5.6 mEq/L — ABNORMAL HIGH (ref 3.5–5.1)
Sodium: 137 mEq/L (ref 135–145)

## 2012-09-02 LAB — CBC WITH DIFFERENTIAL/PLATELET
Basophils Absolute: 0 10*3/uL (ref 0.0–0.1)
Basophils Relative: 0 % (ref 0–1)
Eosinophils Absolute: 0 10*3/uL (ref 0.0–0.7)
Eosinophils Relative: 0 % (ref 0–5)
HCT: 34.7 % — ABNORMAL LOW (ref 36.0–46.0)
Hemoglobin: 11.4 g/dL — ABNORMAL LOW (ref 12.0–15.0)
Lymphocytes Relative: 45 % (ref 12–46)
Lymphs Abs: 4.8 10*3/uL — ABNORMAL HIGH (ref 0.7–4.0)
MCH: 31.6 pg (ref 26.0–34.0)
MCHC: 32.9 g/dL (ref 30.0–36.0)
MCV: 96.1 fL (ref 78.0–100.0)
Monocytes Absolute: 1.3 10*3/uL — ABNORMAL HIGH (ref 0.1–1.0)
Monocytes Relative: 12 % (ref 3–12)
Neutro Abs: 4.6 10*3/uL (ref 1.7–7.7)
Neutrophils Relative %: 43 % (ref 43–77)
Platelets: 234 10*3/uL (ref 150–400)
RBC: 3.61 MIL/uL — ABNORMAL LOW (ref 3.87–5.11)
RDW: 13.2 % (ref 11.5–15.5)
WBC: 10.7 10*3/uL — ABNORMAL HIGH (ref 4.0–10.5)

## 2012-09-02 LAB — URINE MICROSCOPIC-ADD ON

## 2012-09-02 LAB — URINALYSIS, ROUTINE W REFLEX MICROSCOPIC
Glucose, UA: NEGATIVE mg/dL
Hgb urine dipstick: NEGATIVE
Ketones, ur: 15 mg/dL — AB
Nitrite: NEGATIVE
Protein, ur: 30 mg/dL — AB
Specific Gravity, Urine: 1.025 (ref 1.005–1.030)
Urobilinogen, UA: 0.2 mg/dL (ref 0.0–1.0)
pH: 5 (ref 5.0–8.0)

## 2012-09-02 LAB — GLUCOSE, CAPILLARY
Glucose-Capillary: 168 mg/dL — ABNORMAL HIGH (ref 70–99)
Glucose-Capillary: 93 mg/dL (ref 70–99)

## 2012-09-02 MED ORDER — FENTANYL 50 MCG/HR TD PT72: 400.0000 ug | MEDICATED_PATCH | TRANSDERMAL | Status: DC

## 2012-09-02 MED ORDER — LEVOTHYROXINE SODIUM 125 MCG PO TABS
125.0000 ug | ORAL_TABLET | Freq: Every day | ORAL | Status: DC
  Administered 2012-09-03 – 2012-09-06 (×4): 125 ug via ORAL
  Filled 2012-09-02 (×5): qty 1

## 2012-09-02 MED ORDER — HYDROMORPHONE HCL PF 1 MG/ML IJ SOLN
2.0000 mg | Freq: Once | INTRAMUSCULAR | Status: AC
  Administered 2012-09-02: 2 mg via INTRAMUSCULAR
  Filled 2012-09-02: qty 2

## 2012-09-02 MED ORDER — HYDROMORPHONE HCL PF 2 MG/ML IJ SOLN
INTRAMUSCULAR | Status: AC
  Administered 2012-09-02: 2 mg via INTRAVENOUS
  Filled 2012-09-02: qty 1

## 2012-09-02 MED ORDER — SODIUM CHLORIDE 0.9 % IV SOLN
INTRAVENOUS | Status: AC
  Administered 2012-09-02: 20:00:00 via INTRAVENOUS

## 2012-09-02 MED ORDER — INSULIN ASPART 100 UNIT/ML ~~LOC~~ SOLN
0.0000 [IU] | Freq: Every day | SUBCUTANEOUS | Status: DC
  Administered 2012-09-03 – 2012-09-05 (×2): 2 [IU] via SUBCUTANEOUS

## 2012-09-02 MED ORDER — SODIUM CHLORIDE 0.9 % IV SOLN
Freq: Once | INTRAVENOUS | Status: AC
  Administered 2012-09-02: 20 mL/h via INTRAVENOUS

## 2012-09-02 MED ORDER — INSULIN GLARGINE 100 UNIT/ML ~~LOC~~ SOLN
85.0000 [IU] | Freq: Every day | SUBCUTANEOUS | Status: DC
  Administered 2012-09-03: 50 [IU] via SUBCUTANEOUS
  Administered 2012-09-03 – 2012-09-05 (×3): 85 [IU] via SUBCUTANEOUS
  Filled 2012-09-02 (×5): qty 0.85

## 2012-09-02 MED ORDER — INSULIN ASPART 100 UNIT/ML ~~LOC~~ SOLN
0.0000 [IU] | Freq: Three times a day (TID) | SUBCUTANEOUS | Status: DC
  Administered 2012-09-03: 2 [IU] via SUBCUTANEOUS
  Administered 2012-09-03 – 2012-09-05 (×6): 3 [IU] via SUBCUTANEOUS
  Administered 2012-09-06: 2 [IU] via SUBCUTANEOUS

## 2012-09-02 MED ORDER — HEPARIN SODIUM (PORCINE) 5000 UNIT/ML IJ SOLN
5000.0000 [IU] | Freq: Three times a day (TID) | INTRAMUSCULAR | Status: DC
  Administered 2012-09-03 – 2012-09-06 (×11): 5000 [IU] via SUBCUTANEOUS
  Filled 2012-09-02 (×14): qty 1

## 2012-09-02 MED ORDER — GABAPENTIN 300 MG PO CAPS
300.0000 mg | ORAL_CAPSULE | Freq: Every day | ORAL | Status: DC
  Administered 2012-09-03 – 2012-09-05 (×4): 300 mg via ORAL
  Filled 2012-09-02 (×5): qty 1

## 2012-09-02 MED ORDER — HYDROMORPHONE HCL PF 2 MG/ML IJ SOLN: 2.0000 mg | Freq: Once | INTRAMUSCULAR | Status: AC

## 2012-09-02 MED ORDER — DIAZEPAM 5 MG PO TABS: 10.0000 mg | ORAL_TABLET | Freq: Four times a day (QID) | ORAL | Status: DC | PRN

## 2012-09-02 MED ORDER — ATORVASTATIN CALCIUM 10 MG PO TABS
10.0000 mg | ORAL_TABLET | Freq: Every day | ORAL | Status: DC
  Administered 2012-09-03 – 2012-09-06 (×4): 10 mg via ORAL
  Filled 2012-09-02 (×4): qty 1

## 2012-09-02 MED ORDER — GABAPENTIN 300 MG PO CAPS
600.0000 mg | ORAL_CAPSULE | Freq: Every day | ORAL | Status: DC
  Administered 2012-09-03: 600 mg via ORAL
  Filled 2012-09-02: qty 2

## 2012-09-02 NOTE — ED Notes (Addendum)
Pt brought via [facility] for urinary retention and back pain. Pt arouseable, falls asleep easily during triage. Pt sts she has not voided in 3 days. Pt also c/o low left back pain worse than "usual" since riding in car from [place] today.

## 2012-09-02 NOTE — H&P (Signed)
[facility] History and Physical  JANELIS STELZER FAO:[id number] DOB: June 26, 1970    PCP:   Dalbert Mayotte, MD   Chief Complaint: not urinating.  HPI: [name] is an 42 y.o. female with hx of DM but not known to have diabetic nephropathy ( last Cr normal 08/26/12), arthritis on Celebrex 400mg  BID,  HTN not on ACE-I or ARB, hx of nephrolithiasis, depression on Celexa prior but switch to Effexor about a month ago, presents to [facility] feeling malaise, decrease urinary output, and was found to have a Cr of 7.5. Her microscopic urinalysis showed no cell cast. She denied dysuria, fever, chills, or flank pain.  Her K is 5.6, and her CBC was unremarkable.  Bladder scan showed empty bladder.  She has not taken any new meds OTC, and has not taken more Celebrex than prescribed.  She has not been on diuretics, but hadn't taken much oral intake.  Hospitalist was asked to admit her for ARF with Cr going from normal to 7.5 in 8 days.  She has not had any contrast studies.    Rewiew of Systems:  Constitutional: Negative for  fever and chills. No significant weight loss or weight gain Eyes: Negative for eye pain, redness and discharge, diplopia, visual changes, or flashes of light. ENMT: Negative for ear pain, hoarseness, nasal congestion, sinus pressure and sore throat. No headaches; tinnitus, drooling, or problem swallowing. Cardiovascular: Negative for chest pain, palpitations, diaphoresis, dyspnea and peripheral edema. ; No orthopnea, PND Respiratory: Negative for cough, hemoptysis, wheezing and stridor. No pleuritic chestpain. Gastrointestinal: Negative for nausea, vomiting, diarrhea, constipation, abdominal pain, melena, blood in stool, hematemesis, jaundice and rectal bleeding.    Genitourinary: Negative for frequency, dysuria, incontinence,flank pain and hematuria; Musculoskeletal: Negative for back pain and neck pain. Negative for swelling and trauma.;  Skin: . Negative for pruritus, rash,  abrasions, bruising and skin lesion.; ulcerations Neuro: Negative for headache, lightheadedness and neck stiffness. Negative for weakness, altered level of consciousness , altered mental status, extremity weakness, burning feet, involuntary movement, seizure and syncope.  Psych: negative for anxiety, depression, insomnia, tearfulness, panic attacks, hallucinations, paranoia, suicidal or homicidal ideation    Past Medical History  Diagnosis Date  . Diabetes mellitus   . Hypertension   . Wound healing, delayed   . H/O: Bell's palsy 2011  . Depression   . Fibromyalgia   . DJD (degenerative joint disease)   . Asthma   . Right foot drop   . Hypoventilation associated with obesity syndrome   . Hypothyroidism   . Back pain   . GERD (gastroesophageal reflux disease)   . Peripheral vascular disease   . Heart murmur   . Shortness of breath     with Activity  . Chronic pain syndrome   . Non-healing non-surgical wound     Right hip, has Wound vac to hip.  Started as a skin tear.  . Chronic heel ulcer   . Blood transfusion   . Migraine   . History of MRSA infection OF ULCER  . PONV (postoperative nausea and vomiting)   . Dysrhythmia   . Sleep apnea     "study shows not bad enough for CPCP."  . Chronic kidney disease     Kidney Stone  . Anxiety     Past Surgical History  Procedure Laterality Date  . Right elbow    . Back surgery      for lumbar disc disease X2  . 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 Cholecystectomy  1984  . I&d extremity  04/02/2011    Procedure: IRRIGATION AND DEBRIDEMENT EXTREMITY;  Surgeon: Kathryne Hitch;  Location: [facility] OR;  Service: Orthopedics;  Laterality: Right;  I&D right heel ulcer, placement of A-cell graft  . Brain surgery       Plating due to soft spot closing too early- age 47  . Lithotripsy      2007ish  . Incision and drainage of wound  08/29/2011    Procedure: IRRIGATION AND DEBRIDEMENT WOUND;  Surgeon: Wayland Denis,  DO;  Location: [facility] OR;  Service: Plastics;  Laterality: Right;    Medications:  HOME MEDS: Prior to Admission medications   Medication Sig Start Date End Date Taking? Authorizing Provider  atorvastatin (LIPITOR) 10 MG tablet Take 10 mg by mouth daily.     Yes Historical Provider, MD  Biotin 5000 MCG CAPS Take 2 capsules by mouth 2 (two) times daily.     Yes Historical Provider, MD  carisoprodol (SOMA) 350 MG tablet Take 350 mg by mouth 4 (four) times daily as needed. For back spasms.   Yes Historical Provider, MD  celecoxib (CELEBREX) 400 MG capsule Take 400 mg by mouth 2 (two) times daily.     Yes Historical Provider, MD  Cyanocobalamin (VITAMIN B-12 IJ) Inject 100 Units as directed every 30 (thirty) days.    Yes Historical Provider, MD  desvenlafaxine (PRISTIQ) 100 MG 24 hr tablet Take 100 mg by mouth daily.   Yes Historical Provider, MD  diazepam (VALIUM) 5 MG tablet Take 10 mg by mouth 4 (four) times daily as needed. Anxiety/sleep    Yes Historical Provider, MD  esomeprazole (NEXIUM) 40 MG capsule Take 40 mg by mouth daily before breakfast.    Yes Historical Provider, MD  fentaNYL (DURAGESIC - DOSED MCG/HR) 100 MCG/HR Place 4 patches onto the skin every 3 (three) days.    Yes Historical Provider, MD  gabapentin (NEURONTIN) 300 MG capsule Take 300-600 mg by mouth 2 (two) times daily. Take 600 MG in the morning, and 300 MG at night.   Yes Historical Provider, MD  GARLIC PO Take 1 tablet by mouth daily.     Yes Historical Provider, MD  HYDROmorphone (DILAUDID) 8 MG tablet Take 8 mg by mouth every 4 (four) hours as needed. Pain    Yes Historical Provider, MD  insulin glargine (LANTUS) 100 UNIT/ML injection Inject 85 Units into the skin at bedtime.    Yes Historical Provider, MD  Iron Combinations (IRON COMPLEX PO) Take 2 tablets by mouth 2 (two) times daily.     Yes Historical Provider, MD  levocetirizine (XYZAL) 5 MG tablet Take 5 mg by mouth every evening.   Yes Historical Provider, MD   levothyroxine (SYNTHROID, LEVOTHROID) 125 MCG tablet Take 125 mcg by mouth daily.   Yes Historical Provider, MD  Liraglutide (VICTOZA) 18 MG/3ML SOLN Inject 1.8 mg into the skin every morning.    Yes Historical Provider, MD  metoCLOPramide (REGLAN) 10 MG tablet Take 10 mg by mouth daily as needed. For nausea   Yes Historical Provider, MD  Multiple Vitamins-Minerals (MULTIVITAMINS THER. W/MINERALS) TABS Take 1 tablet by mouth daily.     Yes Historical Provider, MD  nystatin (MYCOSTATIN/NYSTOP) 100000 UNIT/GM POWD Apply 1 g topically 2 (two) times daily.   Yes Historical Provider, MD  oxyCODONE-acetaminophen (PERCOCET) 10-325 MG per tablet Take 2 tablets by mouth every 4 (four) hours as needed for pain.  Yes Historical Provider, MD  pregabalin (LYRICA) 75 MG capsule Take 75 mg by mouth 3 (three) times daily.    Yes Historical Provider, MD  sitaGLIPtan-metformin (JANUMET) 50-1000 MG per tablet Take 1 tablet by mouth 2 (two) times daily with a meal.    Yes Historical Provider, MD  VITAMIN D, CHOLECALCIFEROL, PO Take 1 capsule by mouth daily.    Yes Historical Provider, MD  citalopram (CELEXA) 20 MG tablet Take 60 mg by mouth daily.     Historical Provider, MD     Allergies:  Allergies  Allergen Reactions  . Sulfa Antibiotics Hives    Social History:   reports that she has never smoked. She has never used smokeless tobacco. She reports that she does not drink alcohol or use illicit drugs.  Family History: Family History  Problem Relation Age of Onset  . Diabetes type II Father   . Diabetes type II Mother   . Anesthesia problems Mother      Physical Exam: Filed Vitals:   09/02/12 1328 09/02/12 1846 09/02/12 1945 09/02/12 2108  BP: 125/60 104/64 122/46 105/55  Pulse: 107 94 94 99  Temp: 98.6 F (37 C)   97.7 F (36.5 C)  TempSrc: Oral   Oral  Resp:  20 18 18   Height:    5\' 5"  (1.651 m)  Weight:    195.954 kg (432 lb)  SpO2: 92%  96% 94%   Blood pressure 105/55, pulse 99,  temperature 97.7 F (36.5 C), temperature source Oral, resp. rate 18, height 5\' 5"  (1.651 m), weight 195.954 kg (432 lb), SpO2 94.00%.  GEN:  Pleasant  patient lying in the stretcher in no acute distress; cooperative with exam. PSYCH:  alert and oriented x4; does not appear anxious or depressed; affect is appropriate. HEENT: Mucous membranes pink and anicteric; PERRLA; EOM intact; no cervical lymphadenopathy nor thyromegaly or carotid bruit; no JVD; There were no stridor. Neck is very supple. Breasts:: Not examined CHEST WALL: No tenderness CHEST: Normal respiration, clear to auscultation bilaterally.  HEART: Regular rate and rhythm.  There are no murmur, rub, or gallops.   BACK: No kyphosis or scoliosis; no CVA tenderness ABDOMEN: soft and non-tender; no masses, no organomegaly, normal abdominal bowel sounds; no pannus; no intertriginous candida. There is no rebound and no distention. Rectal Exam: Not done EXTREMITIES: No bone or joint deformity; age-appropriate arthropathy of the hands and knees; no edema; no ulcerations.  There is no calf tenderness. Genitalia: not examined PULSES: 2+ and symmetric SKIN: Normal hydration no rash or ulceration CNS: Cranial nerves 2-12 grossly intact no focal lateralizing neurologic deficit.  Speech is fluent; uvula elevated with phonation, facial symmetry and tongue midline. DTR are normal bilaterally, cerebella exam is intact, barbinski is negative and strengths are equaled bilaterally.  No sensory loss.   Labs on Admission:  Basic Metabolic Panel:  Recent Labs Lab 09/02/12 1605  NA 137  K 5.6*  CL 102  CO2 19  GLUCOSE 131*  BUN 64*  CREATININE 7.50*  CALCIUM 8.8   Liver Function Tests: No results found for this basename: AST, ALT, ALKPHOS, BILITOT, PROT, ALBUMIN,  in the last 168 hours No results found for this basename: LIPASE, AMYLASE,  in the last 168 hours No results found for this basename: AMMONIA,  in the last 168 hours CBC:  Recent  Labs Lab 09/02/12 1705  WBC 10.7*  NEUTROABS 4.6  HGB 11.4*  HCT 34.7*  MCV 96.1  PLT 234   Cardiac Enzymes:  No results found for this basename: CKTOTAL, CKMB, CKMBINDEX, TROPONINI,  in the last 168 hours  CBG:  Recent Labs Lab 09/02/12 1402  GLUCAP 168*     Radiological Exams on Admission: No results found.  Assessment/Plan ARF DM Hx of Nephrolithiasis Arthritis Obesity Depression HTN.  PLAN:  I am not certain why she went into ARF in a week.  Pre-renal, she doesn't appear to be volume depleted to his degree.  Perhaps mildly.  No reason to suspect decrease cardiac output.  Renally, she is on Celebrex, but she has been taken this for years.  No definite nephrotoxic drug I can see.  She had no evidence of ATN.  Post- renal, she hadn't void, but her bladder is empty, so unless she has bilateral hydronephrosis, I don't know why she would be oliguric or anuric.  Will obtain renal US, give IVF, and stop all nephrotoxic drugs.  This is certainly not DM cause or HTN cause since it is so acute without evidence of inciting event.  She could have bilateral embolizations to her kidneys, but that is unusual as well.  Her pulse is regular, but I will obtain an EKG to exclude afib.  Please consult nephrology tomorrow for further evaluation.  She is stable, full code, and will be admitted to [facility] service. Thank you for allowing me to partake in the care of this nice patient.  Other plans as per orders.  Code Status: FULL Unk Lightning, MD. [facility] Pager [phone] 7pm to 7am.  09/02/2012, 9:58 PM

## 2012-09-02 NOTE — ED Provider Notes (Signed)
History     CSN: [id number]  Arrival date & time 09/02/12  1324   First MD Initiated Contact with Patient 09/02/12 1439      Chief Complaint  Patient presents with  . Urinary Retention  . Back Pain    (Consider location/radiation/quality/duration/timing/severity/associated sxs/prior treatment) Patient is a 42 y.o. female presenting with abdominal pain. The history is provided by the patient and a significant other. No language interpreter was used.  Abdominal Pain Associated symptoms include abdominal pain, nausea and vomiting. Pertinent negatives include no chills, fever, headaches, neck pain or weakness. Associated symptoms comments: Five to 6 days ago she had difficulty urinating that included feeling like she had to strain to produce any urine. She reports being unable to urinate at all for the past 3 days. No fever. She has had nausea with meals and one episode of vomiting in the last 3 days. No cough, hematuria. No history of urinary retention or renal insufficiency. She reports an increase in back pain over her usual level of chronic pain..    Past Medical History  Diagnosis Date  . Diabetes mellitus   . Hypertension   . Wound healing, delayed   . H/O: Bell's palsy 2011  . Depression   . Fibromyalgia   . DJD (degenerative joint disease)   . Asthma   . Right foot drop   . Hypoventilation associated with obesity syndrome   . Hypothyroidism   . Back pain   . GERD (gastroesophageal reflux disease)   . Peripheral vascular disease   . Heart murmur   . Shortness of breath     with Activity  . Chronic pain syndrome   . Non-healing non-surgical wound     Right hip, has Wound vac to hip.  Started as a skin tear.  . Chronic heel ulcer   . Blood transfusion   . Migraine   . History of MRSA infection OF ULCER  . PONV (postoperative nausea and vomiting)   . Dysrhythmia   . Sleep apnea     "study shows not bad enough for CPCP."  . Chronic kidney disease     Kidney Stone   . Anxiety     Past Surgical History  Procedure Laterality Date  . Right elbow    . Back surgery      for lumbar disc disease X2  . 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 Cholecystectomy  1984  . I&d extremity  04/02/2011    Procedure: IRRIGATION AND DEBRIDEMENT EXTREMITY;  Surgeon: Kathryne Hitch;  Location: [facility] OR;  Service: Orthopedics;  Laterality: Right;  I&D right heel ulcer, placement of A-cell graft  . Brain surgery       Plating due to soft spot closing too early- age 68  . Lithotripsy      2007ish  . Incision and drainage of wound  08/29/2011    Procedure: IRRIGATION AND DEBRIDEMENT WOUND;  Surgeon: Wayland Denis, DO;  Location: [facility] OR;  Service: Plastics;  Laterality: Right;    Family History  Problem Relation Age of Onset  . Diabetes type II Father   . Diabetes type II Mother   . Anesthesia problems Mother     History  Substance Use Topics  . Smoking status: Never Smoker   . Smokeless tobacco: Never Used  . Alcohol Use: No    OB History   Grav Para Term Preterm Abortions TAB SAB Ect Mult Living  Review of Systems  Constitutional: Negative for fever and chills.  HENT: Negative for neck pain and neck stiffness.   Respiratory: Negative.  Negative for shortness of breath.   Cardiovascular: Negative.   Gastrointestinal: Positive for nausea, vomiting and abdominal pain.  Genitourinary: Negative for hematuria.       See HPI.  Musculoskeletal: Positive for back pain.  Skin: Negative.   Neurological: Negative.  Negative for weakness and headaches.    Allergies  Sulfa antibiotics  Home Medications   Current Outpatient Rx  Name  Route  Sig  Dispense  Refill  . atorvastatin (LIPITOR) 10 MG tablet   Oral   Take 10 mg by mouth daily.           . Biotin 5000 MCG CAPS   Oral   Take 2 capsules by mouth 2 (two) times daily.           . carisoprodol (SOMA) 350 MG tablet   Oral   Take 350 mg by mouth 4 (four) times  daily as needed. For back spasms.         . celecoxib (CELEBREX) 400 MG capsule   Oral   Take 400 mg by mouth 2 (two) times daily.           . citalopram (CELEXA) 20 MG tablet   Oral   Take 60 mg by mouth daily.          . Cyanocobalamin (VITAMIN B-12 IJ)   Injection   Inject 100 Units as directed every 30 (thirty) days.          . diazepam (VALIUM) 5 MG tablet   Oral   Take 10 mg by mouth 4 (four) times daily as needed. Anxiety/sleep          . esomeprazole (NEXIUM) 40 MG capsule   Oral   Take 40 mg by mouth daily before breakfast.          . fentaNYL (DURAGESIC - DOSED MCG/HR) 100 MCG/HR   Transdermal   Place 4 patches onto the skin every 3 (three) days.          Marland Kitchen gabapentin (NEURONTIN) 300 MG capsule   Oral   Take 300-600 mg by mouth 2 (two) times daily. Take 600 MG in the morning, and 300 MG at night.         Marland Kitchen GARLIC PO   Oral   Take 1 tablet by mouth daily.           Marland Kitchen HYDROmorphone (DILAUDID) 8 MG tablet   Oral   Take 8 mg by mouth every 4 (four) hours as needed. Pain          . insulin glargine (LANTUS) 100 UNIT/ML injection   Subcutaneous   Inject 85 Units into the skin at bedtime.          . Iron Combinations (IRON COMPLEX PO)   Oral   Take 2 tablets by mouth 2 (two) times daily.           Marland Kitchen levothyroxine (SYNTHROID, LEVOTHROID) 125 MCG tablet   Oral   Take 125 mcg by mouth daily.         . Liraglutide (VICTOZA) 18 MG/3ML SOLN   Subcutaneous   Inject 1.8 mg into the skin every morning.          . metoCLOPramide (REGLAN) 10 MG tablet   Oral   Take 10 mg by mouth every 8 (eight) hours.           Marland Kitchen  mometasone (NASONEX) 50 MCG/ACT nasal spray   Nasal   Place 2 sprays into the nose daily as needed. For allergies.         . Multiple Vitamins-Minerals (MULTIVITAMINS THER. W/MINERALS) TABS   Oral   Take 1 tablet by mouth daily.           Marland Kitchen nystatin (MYCOSTATIN/NYSTOP) 100000 UNIT/GM POWD   Topical   Apply 1 g  topically 2 (two) times daily.         Marland Kitchen oxyCODONE-acetaminophen (PERCOCET) 10-325 MG per tablet   Oral   Take 2 tablets by mouth every 4 (four) hours.          . pregabalin (LYRICA) 75 MG capsule   Oral   Take 75 mg by mouth 3 (three) times daily.          . sitaGLIPtan-metformin (JANUMET) 50-1000 MG per tablet   Oral   Take 1 tablet by mouth 2 (two) times daily with a meal.          . VITAMIN D, CHOLECALCIFEROL, PO   Oral   Take 1 capsule by mouth daily.            BP 125/60  Pulse 107  Temp(Src) 98.6 F (37 C) (Oral)  SpO2 92%  Physical Exam  Constitutional: She is oriented to person, place, and time. She appears well-developed and well-nourished.  HENT:  Head: Normocephalic.  Neck: Normal range of motion. Neck supple.  Cardiovascular: Normal rate and regular rhythm.   Pulmonary/Chest: Effort normal and breath sounds normal.  Abdominal: Soft. Bowel sounds are normal. There is tenderness. There is no rebound and no guarding.  Morbidly obese abdomen with large pannus. Exam limited.  Musculoskeletal: Normal range of motion.  Neurological: She is alert and oriented to person, place, and time.  Skin: Skin is warm and dry. No rash noted.  Psychiatric: She has a normal mood and affect.    ED Course  Procedures (including critical care time)  Labs Reviewed  GLUCOSE, CAPILLARY - Abnormal; Notable for the following:    Glucose-Capillary 168 (*)    All other components within normal limits   Results for orders placed during the hospital encounter of 09/02/12  URINE CULTURE      Result Value Range   Specimen Description URINE, CATHETERIZED     Special Requests NONE     Culture  Setup Time 09/02/2012 22:19     Colony Count NO GROWTH     Culture NO GROWTH     Report Status 09/03/2012 FINAL    MRSA PCR SCREENING      Result Value Range   MRSA by PCR POSITIVE (*) NEGATIVE  GLUCOSE, CAPILLARY      Result Value Range   Glucose-Capillary 168 (*) 70 - 99 mg/dL   URINALYSIS, ROUTINE W REFLEX MICROSCOPIC      Result Value Range   Color, Urine YELLOW  YELLOW   APPearance CLOUDY (*) CLEAR   Specific Gravity, Urine 1.025  1.005 - 1.030   pH 5.0  5.0 - 8.0   Glucose, UA NEGATIVE  NEGATIVE mg/dL   Hgb urine dipstick NEGATIVE  NEGATIVE   Bilirubin Urine MODERATE (*) NEGATIVE   Ketones, ur 15 (*) NEGATIVE mg/dL   Protein, ur 30 (*) NEGATIVE mg/dL   Urobilinogen, UA 0.2  0.0 - 1.0 mg/dL   Nitrite NEGATIVE  NEGATIVE   Leukocytes, UA SMALL (*) NEGATIVE  BASIC METABOLIC PANEL      Result Value Range  Sodium 137  135 - 145 mEq/L   Potassium 5.6 (*) 3.5 - 5.1 mEq/L   Chloride 102  96 - 112 mEq/L   CO2 19  19 - 32 mEq/L   Glucose, Bld 131 (*) 70 - 99 mg/dL   BUN 64 (*) 6 - 23 mg/dL   Creatinine, Ser 1.61 (*) 0.50 - 1.10 mg/dL   Calcium 8.8  8.4 - 09.6 mg/dL   GFR calc non Af Amer 6 (*) >90 mL/min   GFR calc Af Amer 7 (*) >90 mL/min  URINE MICROSCOPIC-ADD ON      Result Value Range   Squamous Epithelial / LPF FEW (*) RARE   WBC, UA 3-6  <3 WBC/hpf   Bacteria, UA FEW (*) RARE  CBC WITH DIFFERENTIAL      Result Value Range   WBC 10.7 (*) 4.0 - 10.5 K/uL   RBC 3.61 (*) 3.87 - 5.11 MIL/uL   Hemoglobin 11.4 (*) 12.0 - 15.0 g/dL   HCT 04.5 (*) 40.9 - 81.1 %   MCV 96.1  78.0 - 100.0 fL   MCH 31.6  26.0 - 34.0 pg   MCHC 32.9  30.0 - 36.0 g/dL   RDW 91.4  78.2 - 95.6 %   Platelets 234  150 - 400 K/uL   Neutrophils Relative % 43  43 - 77 %   Neutro Abs 4.6  1.7 - 7.7 K/uL   Lymphocytes Relative 45  12 - 46 %   Lymphs Abs 4.8 (*) 0.7 - 4.0 K/uL   Monocytes Relative 12  3 - 12 %   Monocytes Absolute 1.3 (*) 0.1 - 1.0 K/uL   Eosinophils Relative 0  0 - 5 %   Eosinophils Absolute 0.0  0.0 - 0.7 K/uL   Basophils Relative 0  0 - 1 %   Basophils Absolute 0.0  0.0 - 0.1 K/uL  CBC      Result Value Range   WBC 9.6  4.0 - 10.5 K/uL   RBC 3.46 (*) 3.87 - 5.11 MIL/uL   Hemoglobin 11.0 (*) 12.0 - 15.0 g/dL   HCT 21.3 (*) 08.6 - 57.8 %   MCV 93.4  78.0 -  100.0 fL   MCH 31.8  26.0 - 34.0 pg   MCHC 34.1  30.0 - 36.0 g/dL   RDW 46.9  62.9 - 52.8 %   Platelets 188  150 - 400 K/uL  CREATININE, SERUM      Result Value Range   Creatinine, Ser 7.17 (*) 0.50 - 1.10 mg/dL   GFR calc non Af Amer 6 (*) >90 mL/min   GFR calc Af Amer 7 (*) >90 mL/min  GLUCOSE, CAPILLARY      Result Value Range   Glucose-Capillary 93  70 - 99 mg/dL  GLUCOSE, CAPILLARY      Result Value Range   Glucose-Capillary 91  70 - 99 mg/dL  GLUCOSE, CAPILLARY      Result Value Range   Glucose-Capillary 94  70 - 99 mg/dL   Comment 1 Notify RN     Comment 2 Documented in Chart    BASIC METABOLIC PANEL      Result Value Range   Sodium 139  135 - 145 mEq/L   Potassium 5.1  3.5 - 5.1 mEq/L   Chloride 106  96 - 112 mEq/L   CO2 20  19 - 32 mEq/L   Glucose, Bld 115 (*) 70 - 99 mg/dL   BUN 66 (*) 6 - 23  mg/dL   Creatinine, Ser 4.09 (*) 0.50 - 1.10 mg/dL   Calcium 7.9 (*) 8.4 - 10.5 mg/dL   GFR calc non Af Amer 6 (*) >90 mL/min   GFR calc Af Amer 7 (*) >90 mL/min  GLUCOSE, CAPILLARY      Result Value Range   Glucose-Capillary 124 (*) 70 - 99 mg/dL   Comment 1 Documented in Chart     Comment 2 Notify RN    GLUCOSE, CAPILLARY      Result Value Range   Glucose-Capillary 192 (*) 70 - 99 mg/dL   Comment 1 Documented in Chart     Comment 2 Notify RN    GLUCOSE, CAPILLARY      Result Value Range   Glucose-Capillary 250 (*) 70 - 99 mg/dL  RENAL FUNCTION PANEL      Result Value Range   Sodium 144  135 - 145 mEq/L   Potassium 5.3 (*) 3.5 - 5.1 mEq/L   Chloride 110  96 - 112 mEq/L   CO2 23  19 - 32 mEq/L   Glucose, Bld 176 (*) 70 - 99 mg/dL   BUN 65 (*) 6 - 23 mg/dL   Creatinine, Ser 8.11 (*) 0.50 - 1.10 mg/dL   Calcium 8.3 (*) 8.4 - 10.5 mg/dL   Phosphorus 6.0 (*) 2.3 - 4.6 mg/dL   Albumin 3.3 (*) 3.5 - 5.2 g/dL   GFR calc non Af Amer 10 (*) >90 mL/min   GFR calc Af Amer 12 (*) >90 mL/min  GLUCOSE, CAPILLARY      Result Value Range   Glucose-Capillary 170 (*) 70 - 99  mg/dL  GLUCOSE, CAPILLARY      Result Value Range   Glucose-Capillary 155 (*) 70 - 99 mg/dL    No results found.   No diagnosis found. 1. Renal Failure 2. Chronic back pain   MDM  She is found to be in renal failure and has no history of same. Back pain is difficult to manage but she states this is her usual. Bladder US done at bedside by Dr. Anitra Lauth without significant urine in bladder. Discussed findings with patient and husband, including need for admission. Discussed with Dr. Rhona Leavens ([facility]) who accepts for admission to [facility]. [facility] advised of need for transfer.        Arnoldo Hooker, PA-C 09/04/12 1638

## 2012-09-02 NOTE — Progress Notes (Signed)
Pt admitted to unit via [facility] from [facility]. Pt is A&O, VS stable, & skin intact with redness in folds of abdomen, groin, & breasts. Admitting MD was paged. Currently waiting on orders. Pt is currently resting comfortably in bed. Will continue to monitor.

## 2012-09-03 ENCOUNTER — Other Ambulatory Visit: Payer: Self-pay

## 2012-09-03 ENCOUNTER — Inpatient Hospital Stay (HOSPITAL_COMMUNITY): Payer: Medicare Other

## 2012-09-03 DIAGNOSIS — N179 Acute kidney failure, unspecified: Principal | ICD-10-CM

## 2012-09-03 DIAGNOSIS — E039 Hypothyroidism, unspecified: Secondary | ICD-10-CM

## 2012-09-03 DIAGNOSIS — E1165 Type 2 diabetes mellitus with hyperglycemia: Secondary | ICD-10-CM

## 2012-09-03 DIAGNOSIS — IMO0002 Reserved for concepts with insufficient information to code with codable children: Secondary | ICD-10-CM

## 2012-09-03 DIAGNOSIS — E118 Type 2 diabetes mellitus with unspecified complications: Secondary | ICD-10-CM

## 2012-09-03 LAB — CBC
HCT: 32.3 % — ABNORMAL LOW (ref 36.0–46.0)
Hemoglobin: 11 g/dL — ABNORMAL LOW (ref 12.0–15.0)
MCH: 31.8 pg (ref 26.0–34.0)
MCHC: 34.1 g/dL (ref 30.0–36.0)
MCV: 93.4 fL (ref 78.0–100.0)
Platelets: 188 10*3/uL (ref 150–400)
RBC: 3.46 MIL/uL — ABNORMAL LOW (ref 3.87–5.11)
RDW: 13.5 % (ref 11.5–15.5)
WBC: 9.6 10*3/uL (ref 4.0–10.5)

## 2012-09-03 LAB — BASIC METABOLIC PANEL
BUN: 66 mg/dL — ABNORMAL HIGH (ref 6–23)
CO2: 20 mEq/L (ref 19–32)
Calcium: 7.9 mg/dL — ABNORMAL LOW (ref 8.4–10.5)
Chloride: 106 mEq/L (ref 96–112)
Creatinine, Ser: 7.18 mg/dL — ABNORMAL HIGH (ref 0.50–1.10)
GFR calc Af Amer: 7 mL/min — ABNORMAL LOW (ref >90)
GFR calc non Af Amer: 6 mL/min — ABNORMAL LOW (ref >90)
Glucose, Bld: 115 mg/dL — ABNORMAL HIGH (ref 70–99)
Potassium: 5.1 mEq/L (ref 3.5–5.1)
Sodium: 139 mEq/L (ref 135–145)

## 2012-09-03 LAB — CREATININE, SERUM
Creatinine, Ser: 7.17 mg/dL — ABNORMAL HIGH (ref 0.50–1.10)
GFR calc Af Amer: 7 mL/min — ABNORMAL LOW (ref >90)
GFR calc non Af Amer: 6 mL/min — ABNORMAL LOW (ref >90)

## 2012-09-03 LAB — GLUCOSE, CAPILLARY
Glucose-Capillary: 91 mg/dL (ref 70–99)
Glucose-Capillary: 94 mg/dL (ref 70–99)
Glucose-Capillary: 124 mg/dL — ABNORMAL HIGH (ref 70–99)
Glucose-Capillary: 192 mg/dL — ABNORMAL HIGH (ref 70–99)
Glucose-Capillary: 250 mg/dL — ABNORMAL HIGH (ref 70–99)

## 2012-09-03 LAB — URINE CULTURE
Colony Count: NO GROWTH
Culture: NO GROWTH

## 2012-09-03 LAB — MRSA PCR SCREENING: MRSA by PCR: POSITIVE — AB

## 2012-09-03 IMAGING — US US RENAL
1 series · 14 of 24 positions shown · non-contrast
Comparison: CT abdomen [DATE]

CLINICAL DATA: Acute renal failure.  Diabetes

RENAL/URINARY TRACT ULTRASOUND COMPLETE

[Series 1: us renal · 0.35mm/px · 14 of 24 slices shown]
[im 1/24]
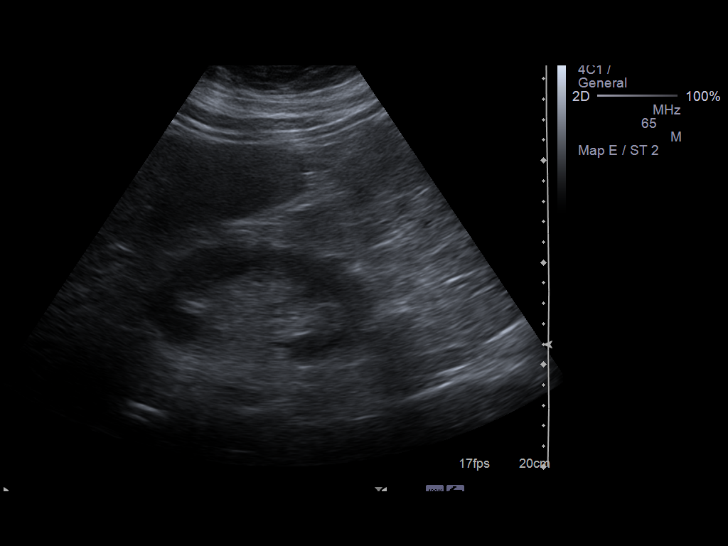
[im 3/24]
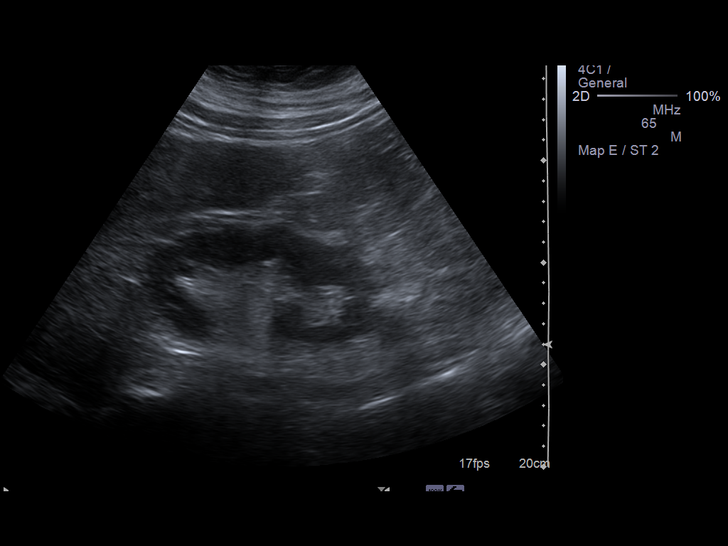
[im 5/24]
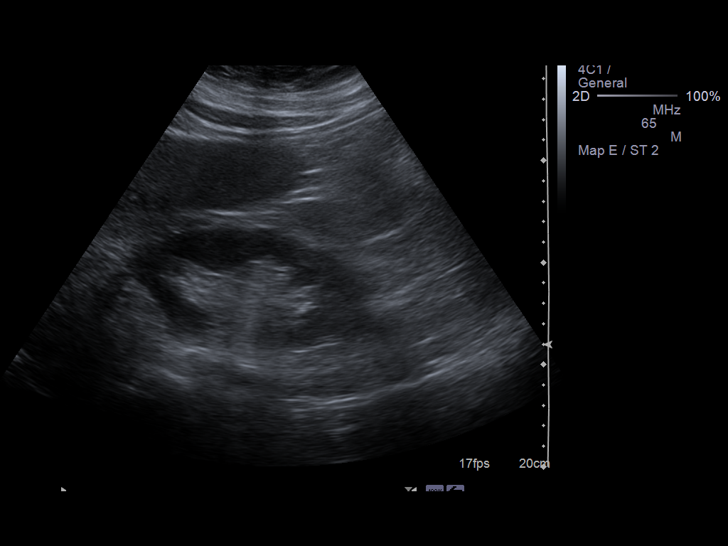
[im 7/24]
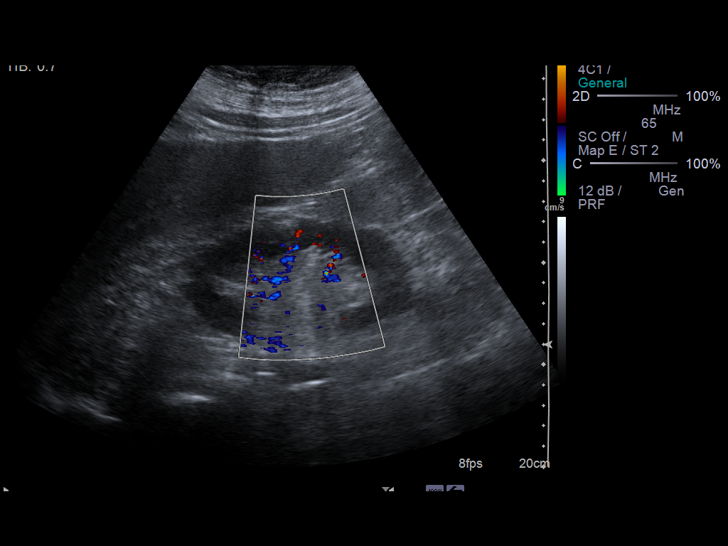
[im 8/24]
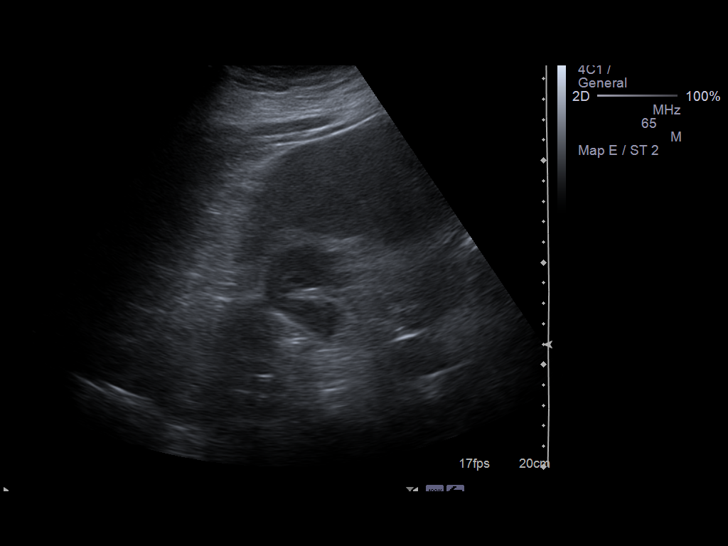
[im 10/24]
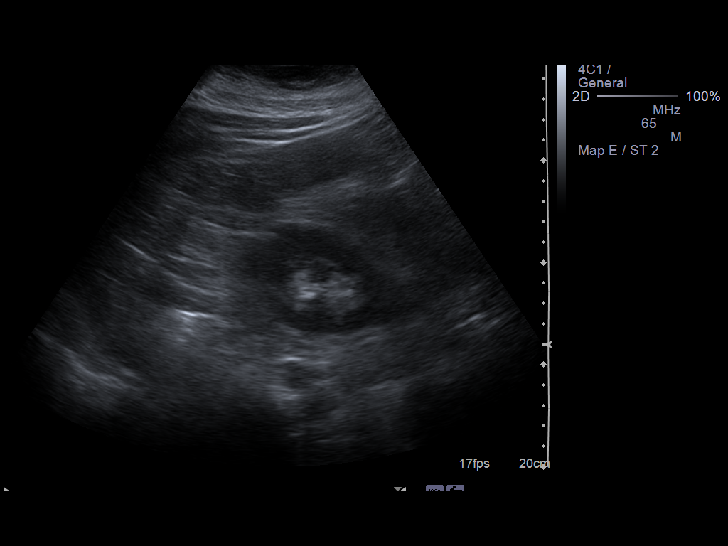
[im 12/24]
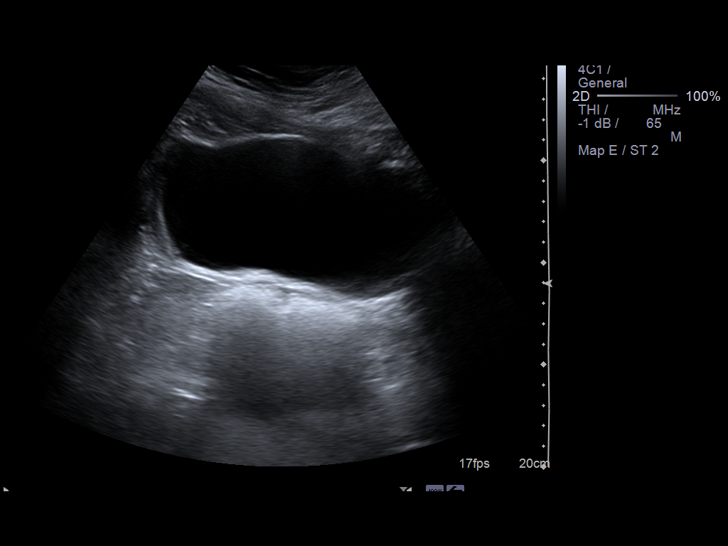
[im 13/24]
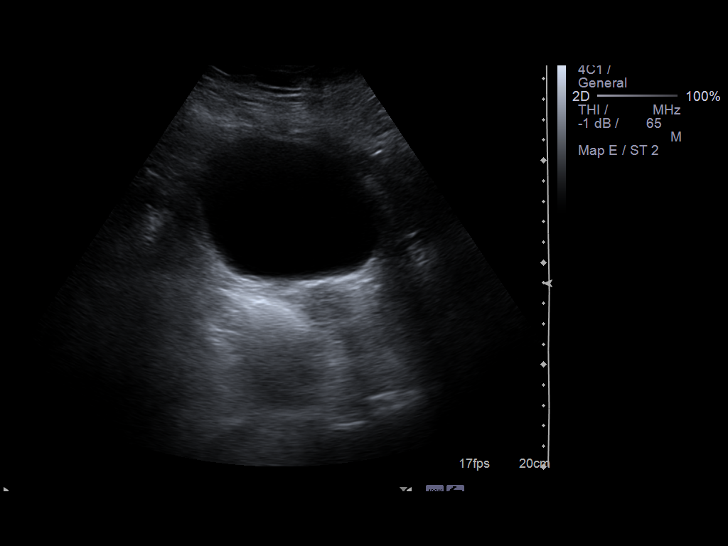
[im 15/24]
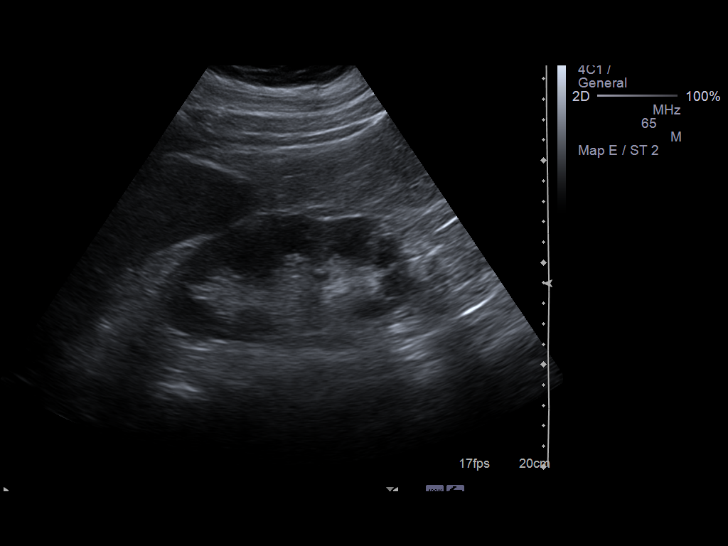
[im 17/24]
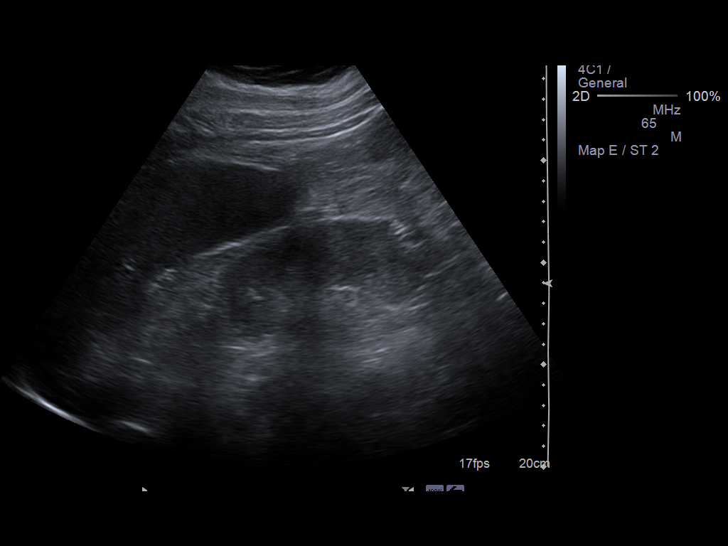
[im 19/24]
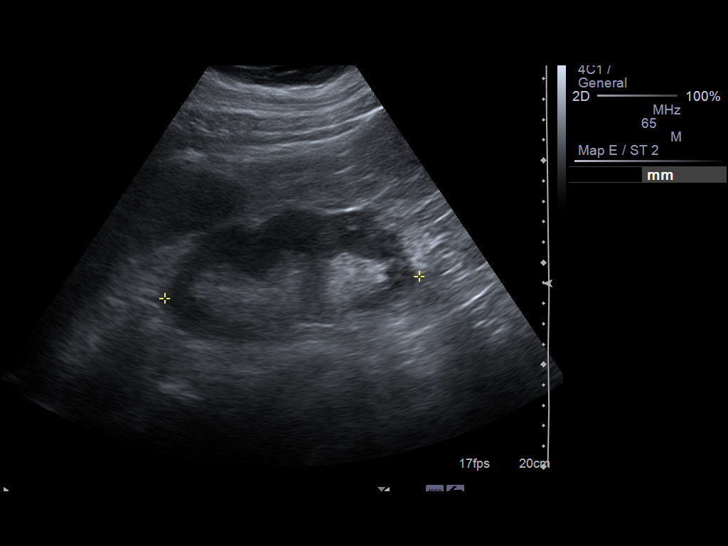
[im 20/24]
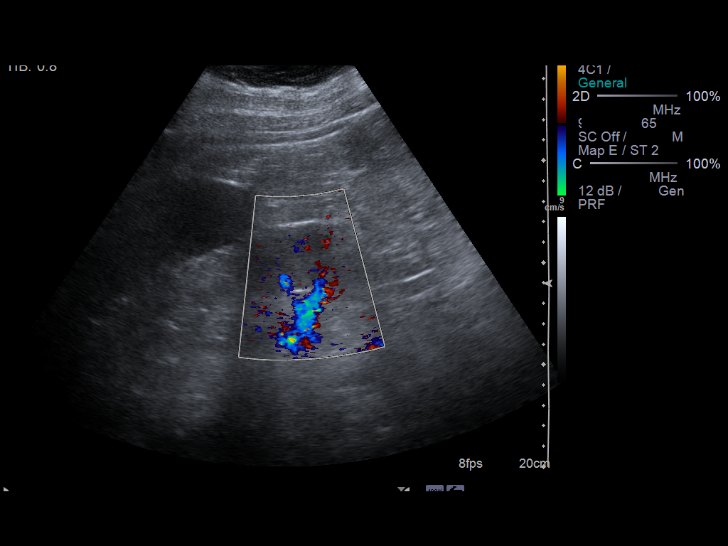
[im 22/24]
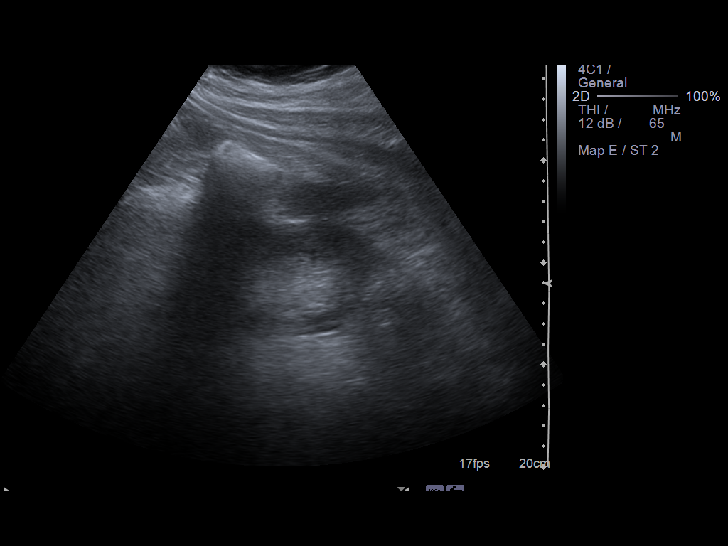
[im 24/24]
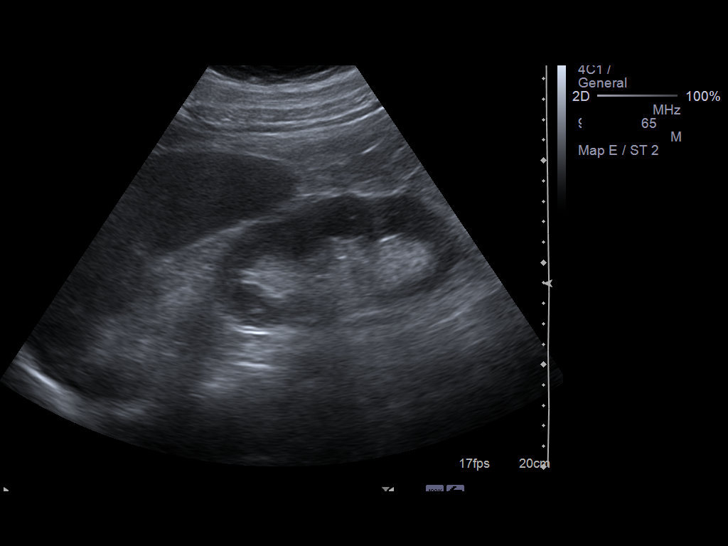

[14 of 24 positions shown; findings below may reference images not displayed]

FINDINGS: Right Kidney:  11.7 cm in length.  Negative for obstruction.
Normal renal cortex without mass lesion.

Left Kidney:  12.5 cm in length.  Negative for obstruction or mass.
Normal renal cortex.

Bladder:  Negative
IMPRESSION: Negative renal ultrasound.

## 2012-09-03 MED ORDER — INSULIN GLARGINE 100 UNIT/ML ~~LOC~~ SOLN
50.0000 [IU] | Freq: Once | SUBCUTANEOUS | Status: DC
  Filled 2012-09-03: qty 0.5

## 2012-09-03 MED ORDER — OXYCODONE-ACETAMINOPHEN 5-325 MG PO TABS
2.0000 | ORAL_TABLET | ORAL | Status: DC | PRN
  Administered 2012-09-03 – 2012-09-06 (×10): 2 via ORAL
  Filled 2012-09-03: qty 2
  Filled 2012-09-03: qty 1
  Filled 2012-09-03 (×4): qty 2
  Filled 2012-09-03: qty 1
  Filled 2012-09-03 (×5): qty 2

## 2012-09-03 MED ORDER — FENTANYL 50 MCG/HR TD PT72
200.0000 ug | MEDICATED_PATCH | TRANSDERMAL | Status: DC
  Administered 2012-09-03 – 2012-09-06 (×2): 200 ug via TRANSDERMAL
  Filled 2012-09-03 (×2): qty 4

## 2012-09-03 MED ORDER — OXYCODONE-ACETAMINOPHEN 10-325 MG PO TABS: 2.0000 | ORAL_TABLET | ORAL | Status: DC | PRN

## 2012-09-03 MED ORDER — MUPIROCIN 2 % EX OINT
1.0000 "application " | TOPICAL_OINTMENT | Freq: Two times a day (BID) | CUTANEOUS | Status: DC
  Administered 2012-09-03 – 2012-09-06 (×7): 1 via NASAL
  Filled 2012-09-03: qty 22

## 2012-09-03 MED ORDER — OXYCODONE HCL 5 MG PO TABS
10.0000 mg | ORAL_TABLET | ORAL | Status: DC | PRN
  Administered 2012-09-03 – 2012-09-06 (×10): 10 mg via ORAL
  Filled 2012-09-03 (×3): qty 2
  Filled 2012-09-03: qty 1
  Filled 2012-09-03 (×4): qty 2
  Filled 2012-09-03: qty 1
  Filled 2012-09-03 (×3): qty 2

## 2012-09-03 MED ORDER — HYDROMORPHONE HCL PF 1 MG/ML IJ SOLN: 2.0000 mg | INTRAMUSCULAR | Status: DC | PRN

## 2012-09-03 MED ORDER — SODIUM CHLORIDE 0.9 % IV SOLN
Freq: Once | INTRAVENOUS | Status: AC
  Administered 2012-09-03: 13:00:00 via INTRAVENOUS

## 2012-09-03 MED ORDER — CHLORHEXIDINE GLUCONATE CLOTH 2 % EX PADS
6.0000 | MEDICATED_PAD | Freq: Every day | CUTANEOUS | Status: DC
  Administered 2012-09-03 – 2012-09-04 (×2): 6 via TOPICAL

## 2012-09-03 NOTE — Progress Notes (Signed)
[facility] PROGRESS NOTE  [name] WJX:[id number] DOB: August 30, 1970 DOA: 09/02/2012 PCP: Dalbert Mayotte, MD  HPI/Subjective: Still feels weak, some nausea but she ate well this morning.  Assessment/Plan:  Acute renal failure -Normal renal function in June of 2013. -Oliguric renal failure, patient reported being on Celebrex, benazepril and high dose of narcotics. -Obtain renal ultrasound, bladder scan showed 150 mL of urine. -Could be secondary to ischemic ATN from hypotension. -Nephrology consultant.  Diabetes mellitus type 2 -Place patient on insulin sliding scale, check hemoglobin A1c. -Continue Lantus insulin, might need to decrease the dose because of declining renal function.  Hypertension -Patient reported taking benazepril. -Blood pressure in the hospital is in the low side suggesting that is probably low at home. -Patient is on huge doses of narcotics, Valium, which can cause soft or low blood pressure.  Chronic pain -As mentioned above patient on high dose of narcotics, benzodiazepines and muscle relaxants for back pain. -Is on Neurontin also. -I will decrease the fentanyl dose from 400 to 200 mcg per day, decrease Neurontin to 300 daily. -Close monitoring for signs of lethargy secondary to medications.  Morbid obesity -Patient weighs 432 pounds.   Code Status: Full code Family Communication: Plan discussed with the patient Disposition Plan: Remains inpatient   Consultants:  Elvis Coil of nephrology  Procedures:  None  Antibiotics:  None    Objective: Filed Vitals:   09/02/12 1945 09/02/12 2108 09/03/12 0551 09/03/12 0647  BP: 122/46 105/55 80/48 103/64  Pulse: 94 99 89   Temp:  97.7 F (36.5 C) 98.4 F (36.9 C)   TempSrc:  Oral Oral   Resp: 18 18 20    Height:  5\' 5"  (1.651 m)    Weight:  195.954 kg (432 lb)    SpO2: 96% 94% 94%     Intake/Output Summary (Last 24 hours) at 09/03/12 1020 Last data filed at 09/02/12 1604  Gross per 24 hour  Intake      0 ml  Output    220 ml  Net   -220 ml   Filed Weights   09/02/12 2108  Weight: 195.954 kg (432 lb)    Exam: General: Alert and awake, oriented x3, not in any acute distress. HEENT: anicteric sclera, pupils reactive to light and accommodation, EOMI CVS: S1-S2 clear, no murmur rubs or gallops Chest: clear to auscultation bilaterally, no wheezing, rales or rhonchi Abdomen: soft nontender, nondistended, normal bowel sounds, no organomegaly Extremities: no cyanosis, clubbing or edema noted bilaterally Neuro: Cranial nerves II-XII intact, no focal neurological deficits   Data Reviewed: Basic Metabolic Panel:  Recent Labs Lab 09/02/12 1605 09/02/12 2337  NA 137  --   K 5.6*  --   CL 102  --   CO2 19  --   GLUCOSE 131*  --   BUN 64*  --   CREATININE 7.50* 7.17*  CALCIUM 8.8  --    Liver Function Tests: No results found for this basename: AST, ALT, ALKPHOS, BILITOT, PROT, ALBUMIN,  in the last 168 hours No results found for this basename: LIPASE, AMYLASE,  in the last 168 hours No results found for this basename: AMMONIA,  in the last 168 hours CBC:  Recent Labs Lab 09/02/12 1705 09/02/12 2337  WBC 10.7* 9.6  NEUTROABS 4.6  --   HGB 11.4* 11.0*  HCT 34.7* 32.3*  MCV 96.1 93.4  PLT 234 188   Cardiac Enzymes: No results found for this basename: CKTOTAL, CKMB, CKMBINDEX, TROPONINI,  in the  last 168 hours BNP (last 3 results) No results found for this basename: PROBNP,  in the last 8760 hours CBG:  Recent Labs Lab 09/02/12 1402 09/02/12 2323 09/03/12 0408 09/03/12 0748  GLUCAP 168* 93 91 94    Recent Results (from the past 240 hour(s))  MRSA PCR SCREENING     Status: Abnormal   Collection Time    09/03/12 12:02 AM      Result Value Range Status   MRSA by PCR POSITIVE (*) NEGATIVE Final   Comment:            The GeneXpert MRSA Assay (FDA     approved for NASAL specimens     only), is one component of a     comprehensive  MRSA colonization     surveillance program. It is not     intended to diagnose MRSA     infection nor to guide or     monitor treatment for     MRSA infections.     RESULT CALLED TO, READ BACK BY AND VERIFIED WITH:     TO AMOHOMMED(RN) BY TCLEVELAND 6/12/2014AT 1:40AM     Studies: No results found.  Scheduled Meds: . atorvastatin  10 mg Oral q1800  . Chlorhexidine Gluconate Cloth  6 each Topical Q0600  . fentaNYL  200 mcg Transdermal Q72H  . gabapentin  300 mg Oral QHS  . gabapentin  600 mg Oral Daily  . heparin  5,000 Units Subcutaneous Q8H  . insulin aspart  0-15 Units Subcutaneous TID WC  . insulin aspart  0-5 Units Subcutaneous QHS  . insulin glargine  50 Units Subcutaneous Once  . insulin glargine  85 Units Subcutaneous QHS  . levothyroxine  125 mcg Oral QAC breakfast  . mupirocin ointment  1 application Nasal BID   Continuous Infusions:   Principal Problem:   Acute renal failure (ARF) Active Problems:   Morbidly obese   Diabetes mellitus type 2 with complications, uncontrolled   Sleep apnea   Hypothyroidism   Chronic pain   Oliguria and anuria    Time spent: 35 minutes    [facility]  [facility] Pager [phone]. If 7PM-7AM, please contact night-coverage at [email or web address], password [facility] 09/03/2012, 10:20 AM  LOS: 1 day

## 2012-09-03 NOTE — Care Management Note (Signed)
    Page 1 of 1   09/07/2012     12:37:07 PM   CARE MANAGEMENT NOTE 09/07/2012  Patient:  [name], [name]   Account Number:  [id number]  Date Initiated:  09/03/2012  Documentation initiated by:  Letha Cape  Subjective/Objective Assessment:   dx acute renal failure  admit- lives with fiance.     Action/Plan:   Anticipated DC Date:  09/05/2012   Anticipated DC Plan:  HOME/SELF CARE      DC Planning Services  CM consult      Choice offered to / List presented to:             Status of service:  Completed, signed off Medicare Important Message given?   (If response is "NO", the following Medicare IM given date fields will be blank) Date Medicare IM given:   Date Additional Medicare IM given:    Discharge Disposition:  HOME/SELF CARE  Per UR Regulation:  Reviewed for med. necessity/level of care/duration of stay  If discussed at Long Length of Stay Meetings, dates discussed:    Comments:  09/03/12 17:22 Letha Cape RN, BSN [phone] patient lives with faince, NCM will continue to follow for dc needs.

## 2012-09-03 NOTE — Progress Notes (Signed)
Bladder scanned done. 150cc found in bladder.

## 2012-09-03 NOTE — Progress Notes (Signed)
Utilization review completed. Shamekia Tippets, RN, BSN. 

## 2012-09-03 NOTE — Progress Notes (Signed)
Foley insertion unsuccesful x 2 RN's. Patient voiding good amount of urine. Will continue to monitor.

## 2012-09-03 NOTE — Consult Note (Signed)
Referring Provider: No ref. provider found Primary Care Physician:  Dalbert Mayotte, MD Primary Nephrologist:  none  Reason for Consultation:  Probably acute renal failure HPI: [name] is an 42 y.o. female with hx of DM but not known to have diabetic nephropathy ( last Cr normal 08/26/12), arthritis on Celebrex 400mg  BID, HTN not on ACE-I or ARB, hx of nephrolithiasis, depression on Celexa prior but switch to Effexor about a month ago, presents to [facility] feeling malaise, decrease urinary output, and was found to have a Cr of 7.5. Her microscopic urinalysis showed no cell cast. She denied dysuria, fever, chills, or flank pain. Her K is 5.6, and her CBC was unremarkable. Bladder scan showed empty bladder. She has not taken any new meds OTC, and has not taken more Celebrex than prescribed.  No prior history of renal failure. Good intake of fluids. No fever or chills   Past Medical History  Diagnosis Date  . Diabetes mellitus   . Hypertension   . Wound healing, delayed   . H/O: Bell's palsy 2011  . Depression   . Fibromyalgia   . DJD (degenerative joint disease)   . Asthma   . Right foot drop   . Hypoventilation associated with obesity syndrome   . Hypothyroidism   . Back pain   . GERD (gastroesophageal reflux disease)   . Peripheral vascular disease   . Heart murmur   . Shortness of breath     with Activity  . Chronic pain syndrome   . Non-healing non-surgical wound     Right hip, has Wound vac to hip.  Started as a skin tear.  . Chronic heel ulcer   . Blood transfusion   . Migraine   . History of MRSA infection OF ULCER  . PONV (postoperative nausea and vomiting)   . Dysrhythmia   . Sleep apnea     "study shows not bad enough for CPCP."  . Chronic kidney disease     Kidney Stone  . Anxiety     Past Surgical History  Procedure Laterality Date  . Right elbow    . Back surgery      for lumbar disc disease X2  . 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 Cholecystectomy  1984  . I&d extremity  04/02/2011    Procedure: IRRIGATION AND DEBRIDEMENT EXTREMITY;  Surgeon: Kathryne Hitch;  Location: [facility] OR;  Service: Orthopedics;  Laterality: Right;  I&D right heel ulcer, placement of A-cell graft  . Brain surgery       Plating due to soft spot closing too early- age 28  . Lithotripsy      2007ish  . Incision and drainage of wound  08/29/2011    Procedure: IRRIGATION AND DEBRIDEMENT WOUND;  Surgeon: Wayland Denis, DO;  Location: [facility] OR;  Service: Plastics;  Laterality: Right;    Prior to Admission medications   Medication Sig Start Date End Date Taking? Authorizing Provider  atorvastatin (LIPITOR) 10 MG tablet Take 10 mg by mouth daily.     Yes Historical Provider, MD  Biotin 5000 MCG CAPS Take 2 capsules by mouth 2 (two) times daily.     Yes Historical Provider, MD  carisoprodol (SOMA) 350 MG tablet Take 350 mg by mouth 4 (four) times daily as needed. For back spasms.   Yes Historical Provider, MD  celecoxib (CELEBREX) 400 MG capsule Take 400 mg by mouth 2 (two) times daily.  Yes Historical Provider, MD  Cyanocobalamin (VITAMIN B-12 IJ) Inject 100 Units as directed every 30 (thirty) days.    Yes Historical Provider, MD  desvenlafaxine (PRISTIQ) 100 MG 24 hr tablet Take 100 mg by mouth daily.   Yes Historical Provider, MD  diazepam (VALIUM) 5 MG tablet Take 10 mg by mouth 4 (four) times daily as needed. Anxiety/sleep    Yes Historical Provider, MD  esomeprazole (NEXIUM) 40 MG capsule Take 40 mg by mouth daily before breakfast.    Yes Historical Provider, MD  fentaNYL (DURAGESIC - DOSED MCG/HR) 100 MCG/HR Place 4 patches onto the skin every 3 (three) days.    Yes Historical Provider, MD  gabapentin (NEURONTIN) 300 MG capsule Take 300-600 mg by mouth 2 (two) times daily. Take 600 MG in the morning, and 300 MG at night.   Yes Historical Provider, MD  GARLIC PO Take 1 tablet by mouth daily.     Yes Historical Provider, MD  HYDROmorphone  (DILAUDID) 8 MG tablet Take 8 mg by mouth every 4 (four) hours as needed. Pain    Yes Historical Provider, MD  insulin glargine (LANTUS) 100 UNIT/ML injection Inject 85 Units into the skin at bedtime.    Yes Historical Provider, MD  Iron Combinations (IRON COMPLEX PO) Take 2 tablets by mouth 2 (two) times daily.     Yes Historical Provider, MD  levocetirizine (XYZAL) 5 MG tablet Take 5 mg by mouth every evening.   Yes Historical Provider, MD  levothyroxine (SYNTHROID, LEVOTHROID) 125 MCG tablet Take 125 mcg by mouth daily.   Yes Historical Provider, MD  Liraglutide (VICTOZA) 18 MG/3ML SOLN Inject 1.8 mg into the skin every morning.    Yes Historical Provider, MD  metoCLOPramide (REGLAN) 10 MG tablet Take 10 mg by mouth daily as needed. For nausea   Yes Historical Provider, MD  Multiple Vitamins-Minerals (MULTIVITAMINS THER. W/MINERALS) TABS Take 1 tablet by mouth daily.     Yes Historical Provider, MD  nystatin (MYCOSTATIN/NYSTOP) 100000 UNIT/GM POWD Apply 1 g topically 2 (two) times daily.   Yes Historical Provider, MD  oxyCODONE-acetaminophen (PERCOCET) 10-325 MG per tablet Take 2 tablets by mouth every 4 (four) hours as needed for pain.    Yes Historical Provider, MD  pregabalin (LYRICA) 75 MG capsule Take 75 mg by mouth 3 (three) times daily.    Yes Historical Provider, MD  sitaGLIPtan-metformin (JANUMET) 50-1000 MG per tablet Take 1 tablet by mouth 2 (two) times daily with a meal.    Yes Historical Provider, MD  VITAMIN D, CHOLECALCIFEROL, PO Take 1 capsule by mouth daily.    Yes Historical Provider, MD  citalopram (CELEXA) 20 MG tablet Take 60 mg by mouth daily.     Historical Provider, MD    Current Facility-Administered Medications  Medication Dose Route Frequency Provider Last Rate Last Dose  . atorvastatin (LIPITOR) tablet 10 mg  10 mg Oral q1800 Houston Siren, MD      . Chlorhexidine Gluconate Cloth 2 % PADS 6 each  6 each Topical Q0600 Clydia Llano, MD   6 each at 09/03/12 0622  .  diazepam (VALIUM) tablet 10 mg  10 mg Oral QID PRN Houston Siren, MD      . fentaNYL (DURAGESIC - dosed mcg/hr) 200 mcg  200 mcg Transdermal Q72H Clydia Llano, MD   200 mcg at 09/03/12 1036  . gabapentin (NEURONTIN) capsule 300 mg  300 mg Oral QHS Houston Siren, MD   300 mg at 09/03/12 0019  . gabapentin (  NEURONTIN) capsule 600 mg  600 mg Oral Daily Houston Siren, MD      . heparin injection 5,000 Units  5,000 Units Subcutaneous Q8H Houston Siren, MD   5,000 Units at 09/03/12 [phone]  . HYDROmorphone (DILAUDID) injection 2 mg  2 mg Intravenous Q4H PRN Clydia Llano, MD      . insulin aspart (novoLOG) injection 0-15 Units  0-15 Units Subcutaneous TID WC Houston Siren, MD      . insulin aspart (novoLOG) injection 0-5 Units  0-5 Units Subcutaneous QHS Houston Siren, MD      . insulin glargine (LANTUS) injection 50 Units  50 Units Subcutaneous Once Leda Gauze, NP      . insulin glargine (LANTUS) injection 85 Units  85 Units Subcutaneous QHS Houston Siren, MD   50 Units at 09/03/12 0015  . levothyroxine (SYNTHROID, LEVOTHROID) tablet 125 mcg  125 mcg Oral QAC breakfast Houston Siren, MD   125 mcg at 09/03/12 0741  . mupirocin ointment (BACTROBAN) 2 % 1 application  1 application Nasal BID Clydia Llano, MD   1 application at 09/03/12 1030  . oxyCODONE-acetaminophen (PERCOCET/ROXICET) 5-325 MG per tablet 2 tablet  2 tablet Oral Q4H PRN Clydia Llano, MD   2 tablet at 09/03/12 0840   And  . oxyCODONE (Oxy IR/ROXICODONE) immediate release tablet 10 mg  10 mg Oral Q4H PRN Clydia Llano, MD   10 mg at 09/03/12 0840    Allergies as of 09/02/2012 - Review Complete 09/02/2012  Allergen Reaction Noted  . Sulfa antibiotics Hives 02/18/2011    Family History  Problem Relation Age of Onset  . Diabetes type II Father   . Diabetes type II Mother   . Anesthesia problems Mother     History   Social History  . Marital Status: Single    Spouse Name: N/A    Number of Children: N/A  . Years of Education: N/A   Occupational History  . Not  on file.   Social History Main Topics  . Smoking status: Never Smoker   . Smokeless tobacco: Never Used  . Alcohol Use: No  . Drug Use: No  . Sexually Active: No   Other Topics Concern  . Not on file   Social History Narrative  . No narrative on file    Review of Systems: Gen: Denies any fever, chills, sweats, anorexia, fatigue, weakness, malaise, weight loss, and sleep disorder HEENT: No visual complaints, No history of Retinopathy. Normal external appearance No Epistaxis or Sore throat. No sinusitis.   CV: Denies chest pain, angina, palpitations, syncope, orthopnea, PND, peripheral edema, and claudication. Resp: Denies dyspnea at rest, dyspnea with exercise, cough, sputum, wheezing, coughing up blood, and pleurisy. GI: Denies vomiting blood, jaundice, and fecal incontinence.   Denies dysphagia or odynophagia. GU : diminishing urine output MS: Chronic pain using narcotics and Celebrex Derm: Denies rash, itching, dry skin, hives, moles, warts, or unhealing ulcers.  Psych: Denies depression, anxiety, memory loss, suicidal ideation, hallucinations, paranoia, and confusion. Heme: Denies bruising, bleeding, and enlarged lymph nodes. Neuro: No headache.  No diplopia. No dysarthria.  No dysphasia.  No history of CVA.  No Seizures. No paresthesias.  No weakness. Endocrine Diabetic No Adrenal disease.  Physical Exam: Vital signs in last 24 hours: Temp:  [97.7 F (36.5 C)-98.6 F (37 C)] 98.4 F (36.9 C) (06/12 0551) Pulse Rate:  [89-107] 89 (06/12 0551) Resp:  [18-20] 20 (06/12 0551) BP: (80-125)/(46-64) 103/64 mmHg (06/12 0647) SpO2:  [92 %-96 %]  94 % (06/12 0551) Weight:  [195.954 kg (432 lb)] 195.954 kg (432 lb) (06/11 2108) Last BM Date: 09/01/12 General:   Alert,  Well-developed, well-nourished, pleasant and cooperative in NAD Head:  Normocephalic and atraumatic. Eyes:  Sclera clear, no icterus.   Conjunctiva pink. Ears:  Normal auditory acuity. Nose:  No deformity,  discharge,  or lesions. Mouth:  No deformity or lesions, dentition normal. Neck:  Supple; no masses or thyromegaly. JVP not elevated Lungs:  Clear throughout to auscultation.   No wheezes, crackles, or rhonchi. No acute distress. Heart:  Regular rate and rhythm; no murmurs, clicks, rubs,  or gallops. Abdomen:  Soft, nontender and nondistended. No masses, hepatosplenomegaly or hernias noted. Normal bowel sounds, without guarding, and without rebound.  obese Msk:  Symmetrical without gross deformities. Normal posture. Pulses:  No carotid, renal, femoral bruits. DP and PT symmetrical and equal Extremities:  Without clubbing or edema. chubby Neurologic:  Alert and  oriented x4;  grossly normal neurologically. Skin:  Intact without significant lesions or rashes. Cervical Nodes:  No significant cervical adenopathy. Psych:  Alert and cooperative. Normal mood and affect.  Intake/Output from previous day: 06/11 0701 - 06/12 0700 In: -  Out: 220 [Urine:220] Intake/Output this shift:    Lab Results:  Recent Labs  09/02/12 1705 09/02/12 2337  WBC 10.7* 9.6  HGB 11.4* 11.0*  HCT 34.7* 32.3*  PLT 234 188   BMET  Recent Labs  09/02/12 1605 09/02/12 2337  NA 137  --   K 5.6*  --   CL 102  --   CO2 19  --   GLUCOSE 131*  --   BUN 64*  --   CREATININE 7.50* 7.17*  CALCIUM 8.8  --    LFT No results found for this basename: PROT, ALBUMIN, AST, ALT, ALKPHOS, BILITOT, BILIDIR, IBILI,  in the last 72 hours PT/INR No results found for this basename: LABPROT, INR,  in the last 72 hours Hepatitis Panel No results found for this basename: HEPBSAG, HCVAB, HEPAIGM, HEPBIGM,  in the last 72 hours  Studies/Results: No results found.  Assessment/Plan:  Acute Renal failure. The uirine sediment appears inactive and does not suggest a glomerular cause. She could have interstitial nephritis from COX 2 inhibitor use although there are no other stigmata. We shall check a renal ultrasound to rule  out obstruction.   Volume. Very hard to assess although it may be reasonable to start IV hydration.  Agree with titrating off her opioid pain meds, renally adjust neurontin, place foley for monitoring.   LOS: 1 Salinda Snedeker W @TODAY @10 :43 AM

## 2012-09-04 DIAGNOSIS — G8929 Other chronic pain: Secondary | ICD-10-CM

## 2012-09-04 LAB — RENAL FUNCTION PANEL
Albumin: 3.3 g/dL — ABNORMAL LOW (ref 3.5–5.2)
BUN: 65 mg/dL — ABNORMAL HIGH (ref 6–23)
CO2: 23 mEq/L (ref 19–32)
Calcium: 8.3 mg/dL — ABNORMAL LOW (ref 8.4–10.5)
Chloride: 110 mEq/L (ref 96–112)
Creatinine, Ser: 4.75 mg/dL — ABNORMAL HIGH (ref 0.50–1.10)
GFR calc Af Amer: 12 mL/min — ABNORMAL LOW (ref >90)
GFR calc non Af Amer: 10 mL/min — ABNORMAL LOW (ref >90)
Glucose, Bld: 176 mg/dL — ABNORMAL HIGH (ref 70–99)
Phosphorus: 6 mg/dL — ABNORMAL HIGH (ref 2.3–4.6)
Potassium: 5.3 mEq/L — ABNORMAL HIGH (ref 3.5–5.1)
Sodium: 144 mEq/L (ref 135–145)

## 2012-09-04 LAB — HEMOGLOBIN A1C
Hgb A1c MFr Bld: 6.4 % — ABNORMAL HIGH (ref <5.7)
Mean Plasma Glucose: 137 mg/dL — ABNORMAL HIGH (ref <117)

## 2012-09-04 LAB — TSH: TSH: 2.388 u[IU]/mL (ref 0.350–4.500)

## 2012-09-04 LAB — GLUCOSE, CAPILLARY
Glucose-Capillary: 170 mg/dL — ABNORMAL HIGH (ref 70–99)
Glucose-Capillary: 155 mg/dL — ABNORMAL HIGH (ref 70–99)
Glucose-Capillary: 173 mg/dL — ABNORMAL HIGH (ref 70–99)
Glucose-Capillary: 169 mg/dL — ABNORMAL HIGH (ref 70–99)

## 2012-09-04 MED ORDER — SODIUM CHLORIDE 0.9 % IV SOLN
INTRAVENOUS | Status: DC
  Administered 2012-09-04 – 2012-09-06 (×5): via INTRAVENOUS

## 2012-09-04 NOTE — Progress Notes (Signed)
[facility] PROGRESS NOTE  [name]:[id number] DOB: 1970-10-17 DOA: 09/02/2012 PCP: Dalbert Mayotte, MD  HPI/Subjective: Feels much better, weakness is improving. Her urine output improved, 2790 mL per day. I will increase the IV fluids to catch up with her urine output.  Assessment/Plan:  Acute renal failure -Normal renal function in June of 2013. -Oliguric renal failure, patient reported being on Celebrex, benazepril and high dose of narcotics. -Obtain renal ultrasound, bladder scan showed 150 mL of urine. -Could be secondary to ischemic ATN from hypotension, urine did not show any casts versus prerenal ARF. -Creatinine improved since yesterday, continue IV fluids.  Diabetes mellitus type 2 -Place patient on insulin sliding scale, check hemoglobin A1c. -Continue Lantus insulin, might need to decrease the dose because of declining renal function.  Hypertension -Patient reported taking benazepril. -Blood pressure in the hospital is in the low side suggesting that is probably low at home. -Patient is on huge doses of narcotics, Valium, which can cause soft or low blood pressure.  Chronic pain -As mentioned above patient on high dose of narcotics, benzodiazepines and muscle relaxants for back pain. -Is on Neurontin also. -I will decrease the fentanyl dose from 400 to 200 mcg per day, decrease Neurontin to 300 daily. -Close monitoring for signs of lethargy secondary to medications.  Morbid obesity -Patient weighs 432 pounds. Counseled about weight loss.   Code Status: Full code Family Communication: Plan discussed with the patient Disposition Plan: Remains inpatient   Consultants:  Elvis Coil of nephrology  Procedures:  None  Antibiotics:  None    Objective: Filed Vitals:   09/03/12 0647 09/03/12 1500 09/03/12 2204 09/04/12 0624  BP: 103/64 124/68 101/65 97/64  Pulse:  87 94 89  Temp:  97.6 F (36.4 C) 98.6 F (37 C) 98.9 F (37.2 C)   TempSrc:  Oral Oral Oral  Resp:  18 18 20   Height:      Weight:      SpO2:  96% 91% 92%    Intake/Output Summary (Last 24 hours) at 09/04/12 1000 Last data filed at 09/04/12 0815  Gross per 24 hour  Intake    800 ml  Output   9790 ml  Net  -8990 ml   Filed Weights   09/02/12 2108  Weight: 195.954 kg (432 lb)    Exam: General: Alert and awake, oriented x3, not in any acute distress. HEENT: anicteric sclera, pupils reactive to light and accommodation, EOMI CVS: S1-S2 clear, no murmur rubs or gallops Chest: clear to auscultation bilaterally, no wheezing, rales or rhonchi Abdomen: soft nontender, nondistended, normal bowel sounds, no organomegaly Extremities: no cyanosis, clubbing or edema noted bilaterally Neuro: Cranial nerves II-XII intact, no focal neurological deficits   Data Reviewed: Basic Metabolic Panel:  Recent Labs Lab 09/02/12 1605 09/02/12 2337 09/03/12 1048 09/04/12 0745  NA 137  --  139 144  K 5.6*  --  5.1 5.3*  CL 102  --  106 110  CO2 19  --  20 23  GLUCOSE 131*  --  115* 176*  BUN 64*  --  66* 65*  CREATININE 7.50* 7.17* 7.18* 4.75*  CALCIUM 8.8  --  7.9* 8.3*  PHOS  --   --   --  6.0*   Liver Function Tests:  Recent Labs Lab 09/04/12 0745  ALBUMIN 3.3*   No results found for this basename: LIPASE, AMYLASE,  in the last 168 hours No results found for this basename: AMMONIA,  in the last 168  hours CBC:  Recent Labs Lab 09/02/12 1705 09/02/12 2337  WBC 10.7* 9.6  NEUTROABS 4.6  --   HGB 11.4* 11.0*  HCT 34.7* 32.3*  MCV 96.1 93.4  PLT 234 188   Cardiac Enzymes: No results found for this basename: CKTOTAL, CKMB, CKMBINDEX, TROPONINI,  in the last 168 hours BNP (last 3 results) No results found for this basename: PROBNP,  in the last 8760 hours CBG:  Recent Labs Lab 09/03/12 0748 09/03/12 1220 09/03/12 1753 09/03/12 2130 09/04/12 0816  GLUCAP 94 124* 192* 250* 170*    Recent Results (from the past 240 hour(s))  URINE  CULTURE     Status: None   Collection Time    09/02/12  4:04 PM      Result Value Range Status   Specimen Description URINE, CATHETERIZED   Final   Special Requests NONE   Final   Culture  Setup Time 09/02/2012 22:19   Final   Colony Count NO GROWTH   Final   Culture NO GROWTH   Final   Report Status 09/03/2012 FINAL   Final  MRSA PCR SCREENING     Status: Abnormal   Collection Time    09/03/12 12:02 AM      Result Value Range Status   MRSA by PCR POSITIVE (*) NEGATIVE Final   Comment:            The GeneXpert MRSA Assay (FDA     approved for NASAL specimens     only), is one component of a     comprehensive MRSA colonization     surveillance program. It is not     intended to diagnose MRSA     infection nor to guide or     monitor treatment for     MRSA infections.     RESULT CALLED TO, READ BACK BY AND VERIFIED WITH:     TO AMOHOMMED(RN) BY TCLEVELAND 6/12/2014AT 1:40AM     Studies: US Renal  09/03/2012   *RADIOLOGY REPORT*  Clinical Data: Acute renal failure.  Diabetes  RENAL/URINARY TRACT ULTRASOUND COMPLETE  Comparison:  CT abdomen 05/20/2008  Findings:  Right Kidney:  11.7 cm in length.  Negative for obstruction. Normal renal cortex without mass lesion.  Left Kidney:  12.5 cm in length.  Negative for obstruction or mass. Normal renal cortex.  Bladder:  Negative  IMPRESSION: Negative renal ultrasound.   Original Report Authenticated By: Janeece Riggers, M.D.    Scheduled Meds: . atorvastatin  10 mg Oral q1800  . Chlorhexidine Gluconate Cloth  6 each Topical Q0600  . fentaNYL  200 mcg Transdermal Q72H  . gabapentin  300 mg Oral QHS  . heparin  5,000 Units Subcutaneous Q8H  . insulin aspart  0-15 Units Subcutaneous TID WC  . insulin aspart  0-5 Units Subcutaneous QHS  . insulin glargine  50 Units Subcutaneous Once  . insulin glargine  85 Units Subcutaneous QHS  . levothyroxine  125 mcg Oral QAC breakfast  . mupirocin ointment  1 application Nasal BID   Continuous  Infusions:   Principal Problem:   Acute renal failure (ARF) Active Problems:   Morbidly obese   Diabetes mellitus type 2 with complications, uncontrolled   Sleep apnea   Hypothyroidism   Chronic pain   Oliguria and anuria    Time spent: 35 minutes    [facility]  [facility] Pager [phone]. If 7PM-7AM, please contact night-coverage at [email or web address], password [facility] 09/04/2012, 10:00 AM  LOS: 2 days

## 2012-09-04 NOTE — Progress Notes (Signed)
[name]   Subjective:   Interval History: appears much improved this morning  Objective:  Vital signs in last 24 hours:  Temp:  [97.6 F (36.4 C)-98.9 F (37.2 C)] 98.9 F (37.2 C) (06/13 0624) Pulse Rate:  [87-94] 89 (06/13 0624) Resp:  [18-20] 20 (06/13 0624) BP: (97-124)/(64-68) 97/64 mmHg (06/13 0624) SpO2:  [91 %-96 %] 92 % (06/13 0624)  Weight change:  Filed Weights   09/02/12 2108  Weight: 195.954 kg (432 lb)    Intake/Output: I/O last 3 completed shifts: In: 965 [P.O.:965] Out: 2790 [Urine:2790]   Intake/Output this shift:  Total I/O In: -  Out: 7000 [Urine:7000]  General: Alert, Well-developed, well-nourished, pleasant and cooperative in NAD  Neck: Supple; no masses or thyromegaly. JVP not elevated  Lungs: Clear throughout to auscultation. No wheezes, crackles, or rhonchi. No acute distress.  Heart: Regular rate and rhythm; no murmurs, clicks, rubs, or gallops.  Abdomen: Soft, nontender and nondistended. No masses, hepatosplenomegaly or hernias noted. Normal bowel sounds, without guarding, and without rebound. obese  Msk: Symmetrical without gross deformities. Normal posture.  Pulses: No carotid, renal, femoral bruits. DP and PT symmetrical and equal  Extremities: Without clubbing or edema. chubby         Basic Metabolic Panel:  Recent Labs Lab 09/02/12 1605 09/02/12 2337 09/03/12 1048 09/04/12 0745  NA 137  --  139 144  K 5.6*  --  5.1 5.3*  CL 102  --  106 110  CO2 19  --  20 23  GLUCOSE 131*  --  115* 176*  BUN 64*  --  66* 65*  CREATININE 7.50* 7.17* 7.18* 4.75*  CALCIUM 8.8  --  7.9* 8.3*  PHOS  --   --   --  6.0*    Liver Function Tests:  Recent Labs Lab 09/04/12 0745  ALBUMIN 3.3*   No results found for this basename: LIPASE, AMYLASE,  in the last 168 hours No results found for this basename: AMMONIA,  in the last 168 hours  CBC:  Recent Labs Lab 09/02/12 1705 09/02/12 2337  WBC 10.7*  9.6  NEUTROABS 4.6  --   HGB 11.4* 11.0*  HCT 34.7* 32.3*  MCV 96.1 93.4  PLT 234 188    Cardiac Enzymes: No results found for this basename: CKTOTAL, CKMB, CKMBINDEX, TROPONINI,  in the last 168 hours  BNP: No components found with this basename: POCBNP,   CBG:  Recent Labs Lab 09/03/12 0748 09/03/12 1220 09/03/12 1753 09/03/12 2130 09/04/12 0816  GLUCAP 94 124* 192* 250* 170*    Microbiology: Results for orders placed during the hospital encounter of 09/02/12  URINE CULTURE     Status: None   Collection Time    09/02/12  4:04 PM      Result Value Range Status   Specimen Description URINE, CATHETERIZED   Final   Special Requests NONE   Final   Culture  Setup Time 09/02/2012 22:19   Final   Colony Count NO GROWTH   Final   Culture NO GROWTH   Final   Report Status 09/03/2012 FINAL   Final  MRSA PCR SCREENING     Status: Abnormal   Collection Time    09/03/12 12:02 AM      Result Value Range Status   MRSA by PCR POSITIVE (*) NEGATIVE Final   Comment:            The GeneXpert MRSA Assay (FDA  approved for NASAL specimens     only), is one component of a     comprehensive MRSA colonization     surveillance program. It is not     intended to diagnose MRSA     infection nor to guide or     monitor treatment for     MRSA infections.     RESULT CALLED TO, READ BACK BY AND VERIFIED WITH:     TO AMOHOMMED(RN) BY TCLEVELAND 6/12/2014AT 1:40AM    Coagulation Studies: No results found for this basename: LABPROT, INR,  in the last 72 hours  Urinalysis:  Recent Labs  09/02/12 1604  COLORURINE YELLOW  LABSPEC 1.025  PHURINE 5.0  GLUCOSEU NEGATIVE  HGBUR NEGATIVE  BILIRUBINUR MODERATE*  KETONESUR 15*  PROTEINUR 30*  UROBILINOGEN 0.2  NITRITE NEGATIVE  LEUKOCYTESUR SMALL*      Imaging: US Renal  09/03/2012   *RADIOLOGY REPORT*  Clinical Data: Acute renal failure.  Diabetes  RENAL/URINARY TRACT ULTRASOUND COMPLETE  Comparison:  CT abdomen 05/20/2008   Findings:  Right Kidney:  11.7 cm in length.  Negative for obstruction. Normal renal cortex without mass lesion.  Left Kidney:  12.5 cm in length.  Negative for obstruction or mass. Normal renal cortex.  Bladder:  Negative  IMPRESSION: Negative renal ultrasound.   Original Report Authenticated By: Janeece Riggers, M.D.     Medications:     . atorvastatin  10 mg Oral q1800  . Chlorhexidine Gluconate Cloth  6 each Topical Q0600  . fentaNYL  200 mcg Transdermal Q72H  . gabapentin  300 mg Oral QHS  . heparin  5,000 Units Subcutaneous Q8H  . insulin aspart  0-15 Units Subcutaneous TID WC  . insulin aspart  0-5 Units Subcutaneous QHS  . insulin glargine  50 Units Subcutaneous Once  . insulin glargine  85 Units Subcutaneous QHS  . levothyroxine  125 mcg Oral QAC breakfast  . mupirocin ointment  1 application Nasal BID   diazepam, HYDROmorphone (DILAUDID) injection, oxyCODONE, oxyCODONE-acetaminophen  Assessment/ Plan:   1.Acute Renal failure. Resolving  2.Volume. Very hard to assess improved urine output   Will continue hydration, should hopefully recover to baseline     LOS: 2 [name] W @TODAY @9 :20 AM

## 2012-09-05 ENCOUNTER — Inpatient Hospital Stay (HOSPITAL_COMMUNITY): Payer: Medicare Other

## 2012-09-05 ENCOUNTER — Other Ambulatory Visit (HOSPITAL_COMMUNITY): Payer: Medicare Other

## 2012-09-05 DIAGNOSIS — K5900 Constipation, unspecified: Secondary | ICD-10-CM

## 2012-09-05 LAB — RENAL FUNCTION PANEL
Albumin: 2.9 g/dL — ABNORMAL LOW (ref 3.5–5.2)
BUN: 49 mg/dL — ABNORMAL HIGH (ref 6–23)
CO2: 20 mEq/L (ref 19–32)
Calcium: 8 mg/dL — ABNORMAL LOW (ref 8.4–10.5)
Chloride: 110 mEq/L (ref 96–112)
Creatinine, Ser: 2.17 mg/dL — ABNORMAL HIGH (ref 0.50–1.10)
GFR calc Af Amer: 31 mL/min — ABNORMAL LOW (ref >90)
GFR calc non Af Amer: 26 mL/min — ABNORMAL LOW (ref >90)
Glucose, Bld: 105 mg/dL — ABNORMAL HIGH (ref 70–99)
Phosphorus: 3.9 mg/dL (ref 2.3–4.6)
Potassium: 5.3 mEq/L — ABNORMAL HIGH (ref 3.5–5.1)
Sodium: 140 mEq/L (ref 135–145)

## 2012-09-05 LAB — URINALYSIS, ROUTINE W REFLEX MICROSCOPIC
Bilirubin Urine: NEGATIVE
Glucose, UA: NEGATIVE mg/dL
Hgb urine dipstick: NEGATIVE
Ketones, ur: NEGATIVE mg/dL
Leukocytes, UA: NEGATIVE
Nitrite: NEGATIVE
Protein, ur: NEGATIVE mg/dL
Specific Gravity, Urine: 1.016 (ref 1.005–1.030)
Urobilinogen, UA: 0.2 mg/dL (ref 0.0–1.0)
pH: 5.5 (ref 5.0–8.0)

## 2012-09-05 LAB — GLUCOSE, CAPILLARY
Glucose-Capillary: 86 mg/dL (ref 70–99)
Glucose-Capillary: 194 mg/dL — ABNORMAL HIGH (ref 70–99)
Glucose-Capillary: 185 mg/dL — ABNORMAL HIGH (ref 70–99)
Glucose-Capillary: 155 mg/dL — ABNORMAL HIGH (ref 70–99)
Glucose-Capillary: 215 mg/dL — ABNORMAL HIGH (ref 70–99)

## 2012-09-05 IMAGING — CT CT ABD-PELV W/O CM
3 of 6 series · 13 of 46 positions shown, 19 images · non-contrast
Comparison: [DATE]

CLINICAL DATA: Bilateral flank pain.  Palpable periumbilical mass.

CT ABDOMEN AND PELVIS WITHOUT CONTRAST
TECHNIQUE: Multidetector CT imaging of the abdomen and pelvis was
performed following the standard protocol without intravenous
contrast.

[Series 20: routine · axial · 0.98mm/px · z∈[+665,+1105]mm · 9 of 112 slices shown, 15 images]
[im 12/112  soft-tissue]
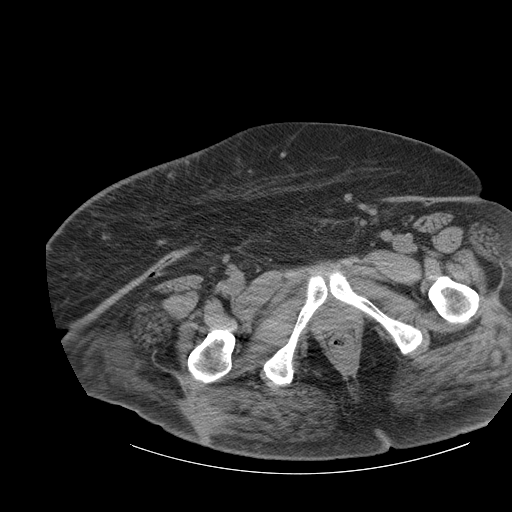
[im 12/112  bone]
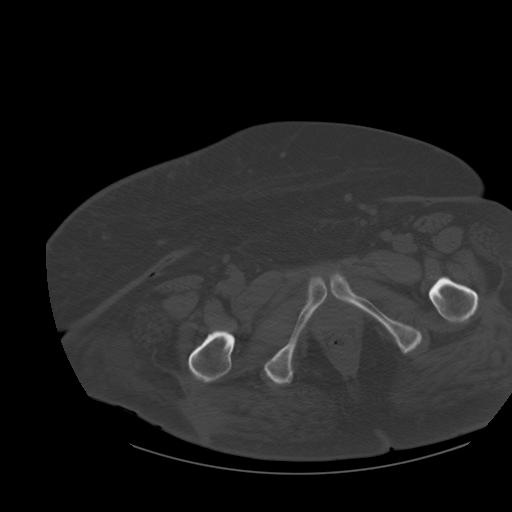
[im 23/112  soft-tissue]
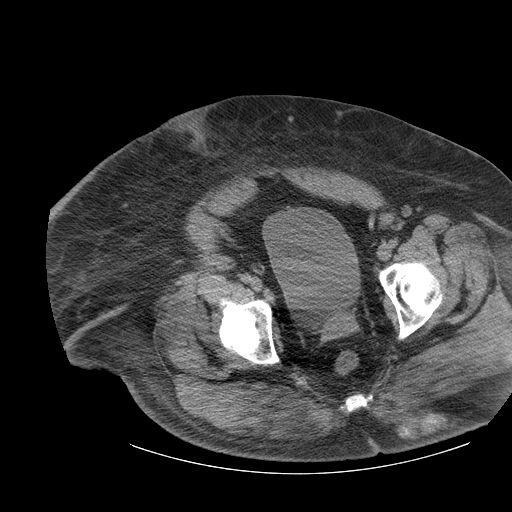
[im 34/112  soft-tissue]
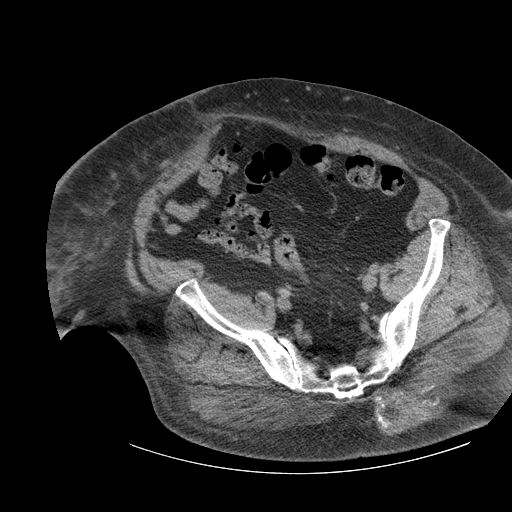
[im 45/112  soft-tissue]
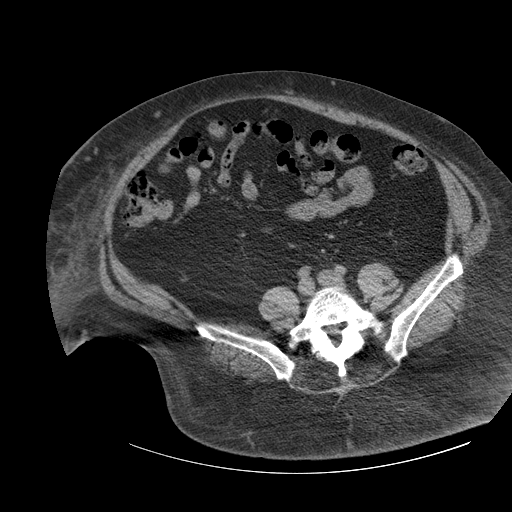
[im 56/112  soft-tissue]
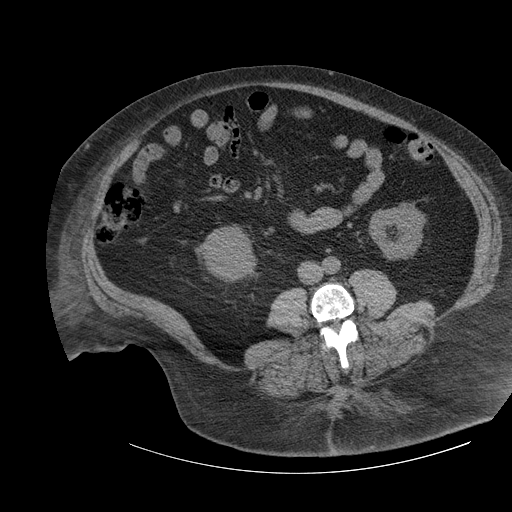
[im 67/112  soft-tissue]
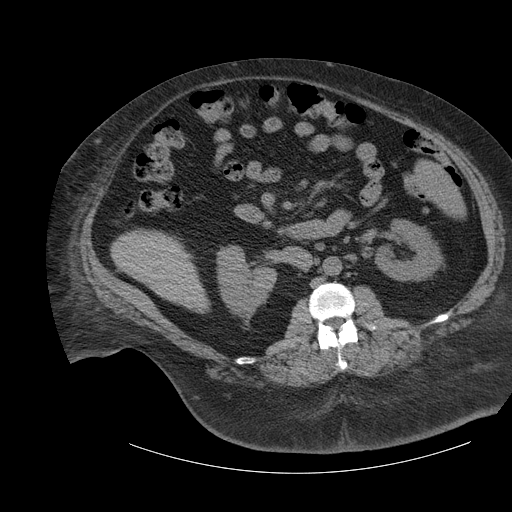
[im 67/112  lung]
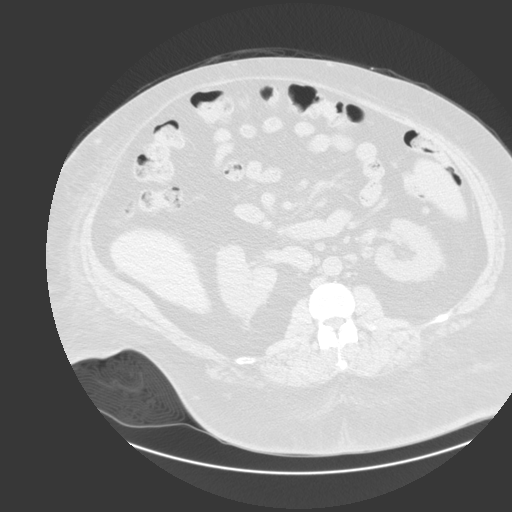
[im 78/112  soft-tissue]
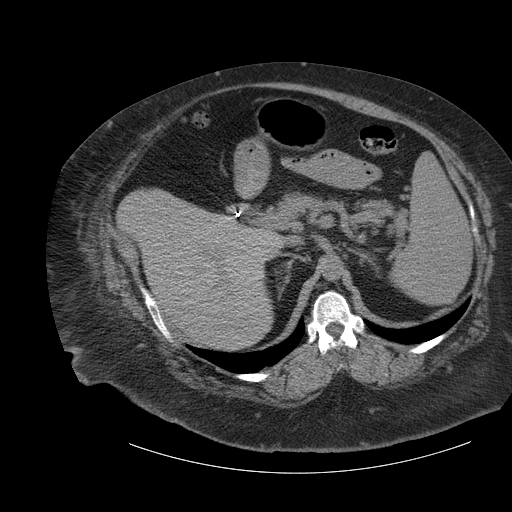
[im 78/112  lung]
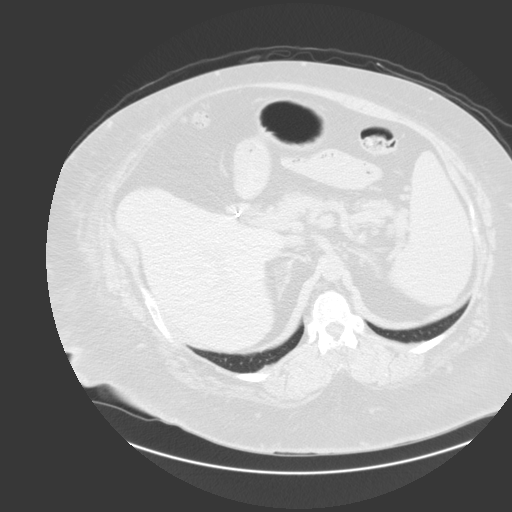
[im 89/112  soft-tissue]
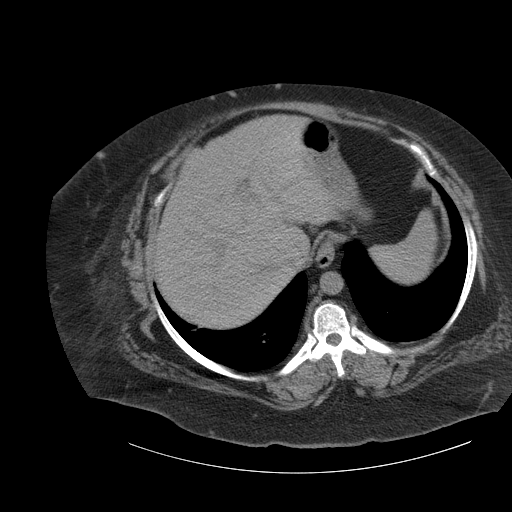
[im 89/112  lung]
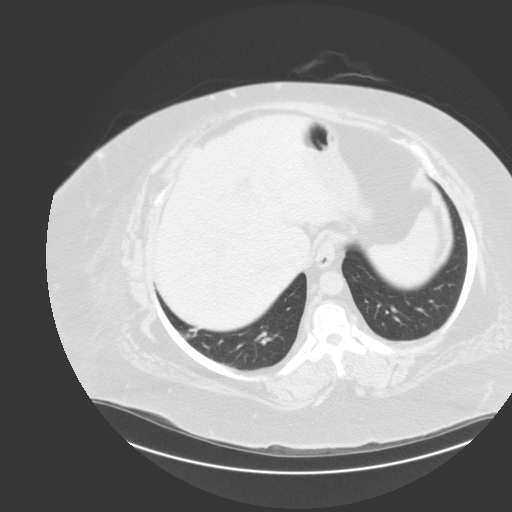
[im 100/112  soft-tissue]
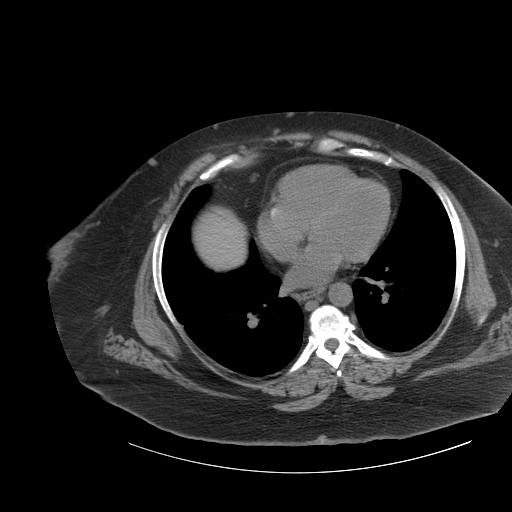
[im 100/112  lung]
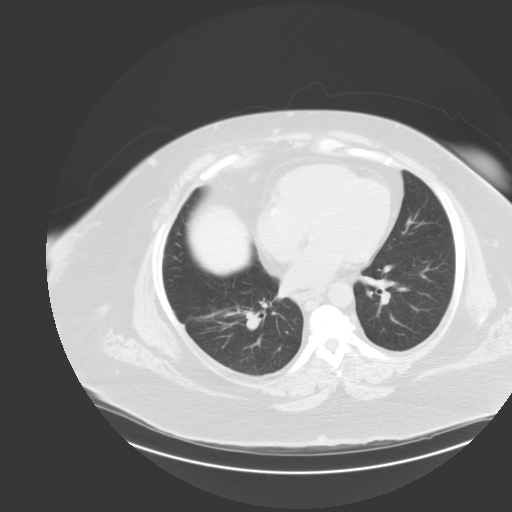
[im 100/112  bone]
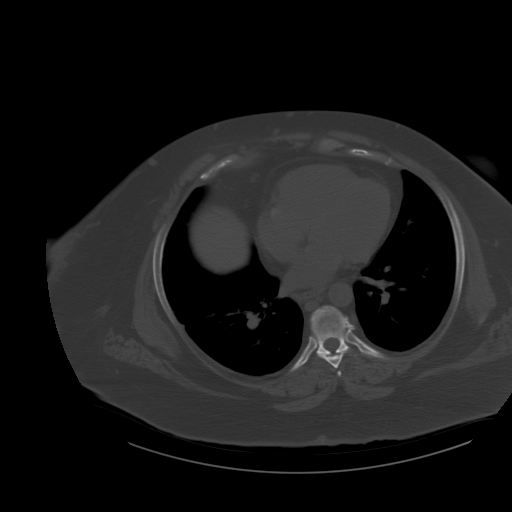

[coronal · coronal · 1.08mm/px · 3 of 113 slices shown]
[im 38/113  soft-tissue]
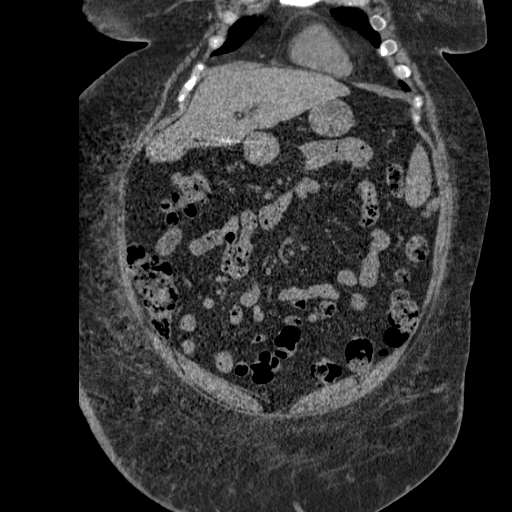
[im 50/113  soft-tissue]
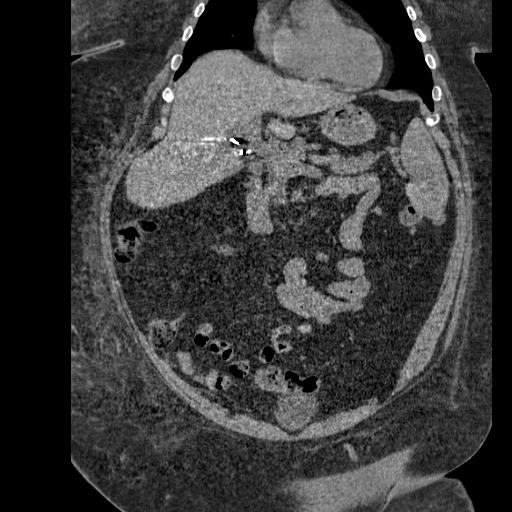
[im 63/113  soft-tissue]
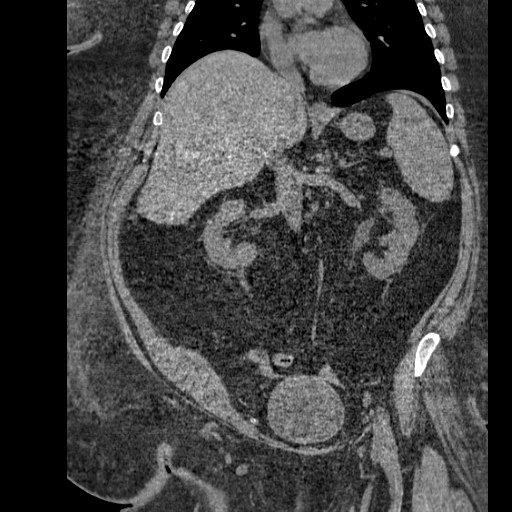

[sag · sagittal · 1.08mm/px · 1 of 148 slices shown]
[im 50/148  soft-tissue]
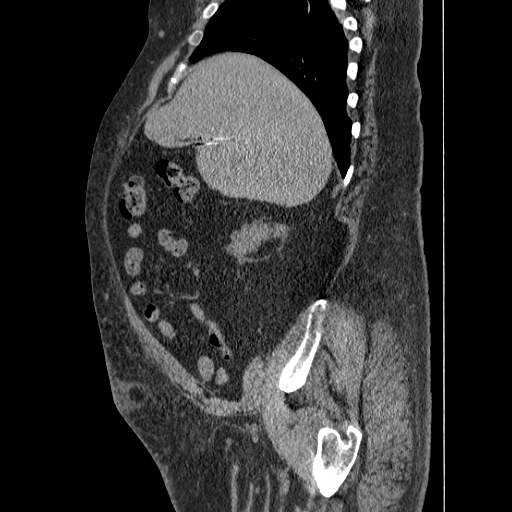

[13 of 46 positions shown; findings below may reference images not displayed]

FINDINGS: Surgical clips again seen from prior cholecystectomy.
Noncontrast images of the liver, pancreas, spleen, adrenal glands,
and kidneys are normal in appearance.  No soft tissue masses or
lymphadenopathy identified within the abdomen or pelvis.  Uterus
and adnexa are unremarkable in appearance.

No evidence of inflammatory process or abnormal fluid collections.
No evidence of dilated bowel loops.  Mild diffuse subcutaneous body
wall edema noted as well as area fat necrosis in the left buttock
subcutaneous tissues.  A small periumbilical hernia is seen
containing only omental fat.  There is no evidence of herniated
bowel loops.  No suspicious bone lesions identified.
IMPRESSION: 1.  No evidence of urolithiasis, hydronephrosis, or other acute
findings.
2.  Small periumbilical hernia containing only fat.

## 2012-09-05 MED ORDER — IOHEXOL 300 MG/ML  SOLN
25.0000 mL | INTRAMUSCULAR | Status: AC
  Administered 2012-09-05 (×2): 25 mL via ORAL

## 2012-09-05 MED ORDER — CHLORHEXIDINE GLUCONATE CLOTH 2 % EX PADS
6.0000 | MEDICATED_PAD | Freq: Every day | CUTANEOUS | Status: DC
  Administered 2012-09-05: 6 via TOPICAL

## 2012-09-05 NOTE — ED Provider Notes (Signed)
Medical screening examination/treatment/procedure(s) were conducted as a shared visit with non-physician practitioner(s) and myself.  I personally evaluated the patient during the encounter Patient with new onset renal failure who presents to do to decreased urination. Bedside ultrasound shows no sign of urinary retention. 90 medications. At this point unclear why patient is in renal failure she does not have a history. Admitted for further care currently patient's potassium is 5.6 vital signs are stable  Gwyneth Sprout, MD 09/05/12 [phone]

## 2012-09-05 NOTE — Progress Notes (Signed)
[facility] ROUNDING NOTE   Subjective:   Interval History: appears improved    Objective:  Vital signs in last 24 hours:  Temp:  [98.2 F (36.8 C)-98.7 F (37.1 C)] 98.7 F (37.1 C) (06/14 0500) Pulse Rate:  [62-114] 62 (06/14 0500) Resp:  [20] 20 (06/14 0500) BP: (97-145)/(67-81) 145/81 mmHg (06/14 0500) SpO2:  [93 %-100 %] 100 % (06/14 0500)  Weight change:  Filed Weights   09/02/12 2108  Weight: 195.954 kg (432 lb)    Intake/Output: I/O last 3 completed shifts: In: 2303 [P.O.:1068; I.V.:1235] Out: 16109 [Urine:10200]   Intake/Output this shift:  Total I/O In: -  Out: 800 [Urine:800]  General: Alert, Well-developed, well-nourished, pleasant and cooperative in NAD  Neck: Supple; no masses or thyromegaly. JVP not elevated  Lungs: Clear throughout to auscultation. No wheezes, crackles, or rhonchi. No acute distress.  Heart: Regular rate and rhythm; no murmurs, clicks, rubs, or gallops.  Abdomen: Soft, nontender and nondistended. No masses, hepatosplenomegaly or hernias noted. Normal bowel sounds, without guarding, and without rebound. obese  Msk: Symmetrical without gross deformities. Normal posture.  Pulses: No carotid, renal, femoral bruits. DP and PT symmetrical and equal  Extremities: Without clubbing or edema. chubby        Basic Metabolic Panel:  Recent Labs Lab 09/02/12 1605 09/02/12 2337 09/03/12 1048 09/04/12 0745 09/05/12 0415  NA 137  --  139 144 140  K 5.6*  --  5.1 5.3* 5.3*  CL 102  --  106 110 110  CO2 19  --  20 23 20   GLUCOSE 131*  --  115* 176* 105*  BUN 64*  --  66* 65* 49*  CREATININE 7.50* 7.17* 7.18* 4.75* 2.17*  CALCIUM 8.8  --  7.9* 8.3* 8.0*  PHOS  --   --   --  6.0* 3.9    Liver Function Tests:  Recent Labs Lab 09/04/12 0745 09/05/12 0415  ALBUMIN 3.3* 2.9*   No results found for this basename: LIPASE, AMYLASE,  in the last 168 hours No results found for this basename: AMMONIA,  in the last 168  hours  CBC:  Recent Labs Lab 09/02/12 1705 09/02/12 2337  WBC 10.7* 9.6  NEUTROABS 4.6  --   HGB 11.4* 11.0*  HCT 34.7* 32.3*  MCV 96.1 93.4  PLT 234 188    Cardiac Enzymes: No results found for this basename: CKTOTAL, CKMB, CKMBINDEX, TROPONINI,  in the last 168 hours  BNP: No components found with this basename: POCBNP,   CBG:  Recent Labs Lab 09/04/12 0816 09/04/12 1241 09/04/12 1657 09/04/12 2130 09/05/12 0740  GLUCAP 170* 155* 173* 169* 86    Microbiology: Results for orders placed during the hospital encounter of 09/02/12  URINE CULTURE     Status: None   Collection Time    09/02/12  4:04 PM      Result Value Range Status   Specimen Description URINE, CATHETERIZED   Final   Special Requests NONE   Final   Culture  Setup Time 09/02/2012 22:19   Final   Colony Count NO GROWTH   Final   Culture NO GROWTH   Final   Report Status 09/03/2012 FINAL   Final  MRSA PCR SCREENING     Status: Abnormal   Collection Time    09/03/12 12:02 AM      Result Value Range Status   MRSA by PCR POSITIVE (*) NEGATIVE Final   Comment:  The GeneXpert MRSA Assay (FDA     approved for NASAL specimens     only), is one component of a     comprehensive MRSA colonization     surveillance program. It is not     intended to diagnose MRSA     infection nor to guide or     monitor treatment for     MRSA infections.     RESULT CALLED TO, READ BACK BY AND VERIFIED WITH:     TO AMOHOMMED(RN) BY TCLEVELAND 6/12/2014AT 1:40AM    Coagulation Studies: No results found for this basename: LABPROT, INR,  in the last 72 hours  Urinalysis:  Recent Labs  09/02/12 1604  COLORURINE YELLOW  LABSPEC 1.025  PHURINE 5.0  GLUCOSEU NEGATIVE  HGBUR NEGATIVE  BILIRUBINUR MODERATE*  KETONESUR 15*  PROTEINUR 30*  UROBILINOGEN 0.2  NITRITE NEGATIVE  LEUKOCYTESUR SMALL*      Imaging: US Renal  09/03/2012   *RADIOLOGY REPORT*  Clinical Data: Acute renal failure.  Diabetes   RENAL/URINARY TRACT ULTRASOUND COMPLETE  Comparison:  CT abdomen 05/20/2008  Findings:  Right Kidney:  11.7 cm in length.  Negative for obstruction. Normal renal cortex without mass lesion.  Left Kidney:  12.5 cm in length.  Negative for obstruction or mass. Normal renal cortex.  Bladder:  Negative  IMPRESSION: Negative renal ultrasound.   Original Report Authenticated By: Janeece Riggers, M.D.     Medications:   . sodium chloride 100 mL/hr at 09/04/12 2001   . atorvastatin  10 mg Oral q1800  . Chlorhexidine Gluconate Cloth  6 each Topical Q0600  . fentaNYL  200 mcg Transdermal Q72H  . gabapentin  300 mg Oral QHS  . heparin  5,000 Units Subcutaneous Q8H  . insulin aspart  0-15 Units Subcutaneous TID WC  . insulin aspart  0-5 Units Subcutaneous QHS  . insulin glargine  50 Units Subcutaneous Once  . insulin glargine  85 Units Subcutaneous QHS  . levothyroxine  125 mcg Oral QAC breakfast  . mupirocin ointment  1 application Nasal BID   diazepam, HYDROmorphone (DILAUDID) injection, oxyCODONE, oxyCODONE-acetaminophen  Assessment/ Plan:  1.Acute Renal failure. Resolving   2.Volume. Very hard to assess improved urine output but documents good urine   Will continue hydration, should hopefully recover to baseline     LOS: 3 [name] W @TODAY @11 :01 AM

## 2012-09-05 NOTE — Progress Notes (Signed)
[facility] PROGRESS NOTE  AVNEET ASHMORE MVH:[id number] DOB: 03/13/1971 DOA: 09/02/2012 PCP: Dalbert Mayotte, MD  HPI/Subjective: Feels much better, weakness is improving. Creatinine is improving, today is 2.1.  Assessment/Plan:  Acute renal failure -Normal renal function in June of 2013. -Oliguric renal failure, patient reported being on Celebrex, benazepril and high dose of narcotics. -Obtain renal ultrasound, bladder scan showed 150 mL of urine. -Could be secondary to ischemic ATN from hypotension, urine did not show any casts versus prerenal ARF. -Creatinine improved since yesterday, continue IV fluids.  Diabetes mellitus type 2 -Place patient on insulin sliding scale, check hemoglobin A1c. -Continue Lantus insulin, might need to decrease the dose because of declining renal function.  Hypertension -Patient reported taking benazepril. -Blood pressure in the hospital is in the low side suggesting that is probably low at home. -Patient is on huge doses of narcotics, Valium, which can cause soft or low blood pressure.  Chronic pain -As mentioned above patient on high dose of narcotics, benzodiazepines and muscle relaxants for back pain. -Is on Neurontin also. -I will decrease the fentanyl dose from 400 to 200 mcg per day, decrease Neurontin to 300 daily. -Close monitoring for signs of lethargy secondary to medications.  Morbid obesity -Patient weighs 432 pounds. Counseled about weight loss. -Complaining about a bulge around her umbilicus, very concerned, we'll do CT scan before she goes home.   Code Status: Full code Family Communication: Plan discussed with the patient Disposition Plan: Remains inpatient   Consultants:  Elvis Coil of nephrology  Procedures:  None  Antibiotics:  None    Objective: Filed Vitals:   09/04/12 0624 09/04/12 1518 09/04/12 2130 09/05/12 0500  BP: 97/64 105/67 97/70 145/81  Pulse: 89 114  62  Temp: 98.9 F (37.2 C) 98.4  F (36.9 C) 98.2 F (36.8 C) 98.7 F (37.1 C)  TempSrc: Oral Oral Oral Oral  Resp: 20 20 20 20   Height:      Weight:      SpO2: 92% 93% 95% 100%    Intake/Output Summary (Last 24 hours) at 09/05/12 1029 Last data filed at 09/05/12 0800  Gross per 24 hour  Intake   1715 ml  Output   2300 ml  Net   -585 ml   Filed Weights   09/02/12 2108  Weight: 195.954 kg (432 lb)    Exam: General: Alert and awake, oriented x3, not in any acute distress. HEENT: anicteric sclera, pupils reactive to light and accommodation, EOMI CVS: S1-S2 clear, no murmur rubs or gallops Chest: clear to auscultation bilaterally, no wheezing, rales or rhonchi Abdomen: soft nontender, nondistended, normal bowel sounds, no organomegaly Extremities: no cyanosis, clubbing or edema noted bilaterally Neuro: Cranial nerves II-XII intact, no focal neurological deficits   Data Reviewed: Basic Metabolic Panel:  Recent Labs Lab 09/02/12 1605 09/02/12 2337 09/03/12 1048 09/04/12 0745 09/05/12 0415  NA 137  --  139 144 140  K 5.6*  --  5.1 5.3* 5.3*  CL 102  --  106 110 110  CO2 19  --  20 23 20   GLUCOSE 131*  --  115* 176* 105*  BUN 64*  --  66* 65* 49*  CREATININE 7.50* 7.17* 7.18* 4.75* 2.17*  CALCIUM 8.8  --  7.9* 8.3* 8.0*  PHOS  --   --   --  6.0* 3.9   Liver Function Tests:  Recent Labs Lab 09/04/12 0745 09/05/12 0415  ALBUMIN 3.3* 2.9*   No results found for this basename: LIPASE,  AMYLASE,  in the last 168 hours No results found for this basename: AMMONIA,  in the last 168 hours CBC:  Recent Labs Lab 09/02/12 1705 09/02/12 2337  WBC 10.7* 9.6  NEUTROABS 4.6  --   HGB 11.4* 11.0*  HCT 34.7* 32.3*  MCV 96.1 93.4  PLT 234 188   Cardiac Enzymes: No results found for this basename: CKTOTAL, CKMB, CKMBINDEX, TROPONINI,  in the last 168 hours BNP (last 3 results) No results found for this basename: PROBNP,  in the last 8760 hours CBG:  Recent Labs Lab 09/04/12 0816 09/04/12 1241  09/04/12 1657 09/04/12 2130 09/05/12 0740  GLUCAP 170* 155* 173* 169* 86    Recent Results (from the past 240 hour(s))  URINE CULTURE     Status: None   Collection Time    09/02/12  4:04 PM      Result Value Range Status   Specimen Description URINE, CATHETERIZED   Final   Special Requests NONE   Final   Culture  Setup Time 09/02/2012 22:19   Final   Colony Count NO GROWTH   Final   Culture NO GROWTH   Final   Report Status 09/03/2012 FINAL   Final  MRSA PCR SCREENING     Status: Abnormal   Collection Time    09/03/12 12:02 AM      Result Value Range Status   MRSA by PCR POSITIVE (*) NEGATIVE Final   Comment:            The GeneXpert MRSA Assay (FDA     approved for NASAL specimens     only), is one component of a     comprehensive MRSA colonization     surveillance program. It is not     intended to diagnose MRSA     infection nor to guide or     monitor treatment for     MRSA infections.     RESULT CALLED TO, READ BACK BY AND VERIFIED WITH:     TO AMOHOMMED(RN) BY TCLEVELAND 6/12/2014AT 1:40AM     Studies: US Renal  09/03/2012   *RADIOLOGY REPORT*  Clinical Data: Acute renal failure.  Diabetes  RENAL/URINARY TRACT ULTRASOUND COMPLETE  Comparison:  CT abdomen 05/20/2008  Findings:  Right Kidney:  11.7 cm in length.  Negative for obstruction. Normal renal cortex without mass lesion.  Left Kidney:  12.5 cm in length.  Negative for obstruction or mass. Normal renal cortex.  Bladder:  Negative  IMPRESSION: Negative renal ultrasound.   Original Report Authenticated By: Janeece Riggers, M.D.    Scheduled Meds: . atorvastatin  10 mg Oral q1800  . Chlorhexidine Gluconate Cloth  6 each Topical Q0600  . fentaNYL  200 mcg Transdermal Q72H  . gabapentin  300 mg Oral QHS  . heparin  5,000 Units Subcutaneous Q8H  . insulin aspart  0-15 Units Subcutaneous TID WC  . insulin aspart  0-5 Units Subcutaneous QHS  . insulin glargine  50 Units Subcutaneous Once  . insulin glargine  85 Units  Subcutaneous QHS  . levothyroxine  125 mcg Oral QAC breakfast  . mupirocin ointment  1 application Nasal BID   Continuous Infusions: . sodium chloride 100 mL/hr at 09/04/12 2001    Principal Problem:   Acute renal failure (ARF) Active Problems:   Morbidly obese   Diabetes mellitus type 2 with complications, uncontrolled   Sleep apnea   Hypothyroidism   Chronic pain   Oliguria and anuria    Time spent:  35 minutes    [facility]  [facility] Pager [phone]. If 7PM-7AM, please contact night-coverage at [email or web address], password [facility] 09/05/2012, 10:29 AM  LOS: 3 days

## 2012-09-06 LAB — RENAL FUNCTION PANEL
Albumin: 3 g/dL — ABNORMAL LOW (ref 3.5–5.2)
BUN: 30 mg/dL — ABNORMAL HIGH (ref 6–23)
CO2: 25 mEq/L (ref 19–32)
Calcium: 8 mg/dL — ABNORMAL LOW (ref 8.4–10.5)
Chloride: 111 mEq/L (ref 96–112)
Creatinine, Ser: 1.3 mg/dL — ABNORMAL HIGH (ref 0.50–1.10)
GFR calc Af Amer: 57 mL/min — ABNORMAL LOW (ref >90)
GFR calc non Af Amer: 49 mL/min — ABNORMAL LOW (ref >90)
Glucose, Bld: 144 mg/dL — ABNORMAL HIGH (ref 70–99)
Phosphorus: 3.4 mg/dL (ref 2.3–4.6)
Potassium: 5.3 mEq/L — ABNORMAL HIGH (ref 3.5–5.1)
Sodium: 141 mEq/L (ref 135–145)

## 2012-09-06 LAB — GLUCOSE, CAPILLARY: Glucose-Capillary: 109 mg/dL — ABNORMAL HIGH (ref 70–99)

## 2012-09-06 MED ORDER — ONDANSETRON HCL 4 MG/2ML IJ SOLN
4.0000 mg | Freq: Four times a day (QID) | INTRAMUSCULAR | Status: DC | PRN
  Administered 2012-09-06 (×2): 4 mg via INTRAVENOUS
  Filled 2012-09-06 (×2): qty 2

## 2012-09-06 MED ORDER — SODIUM POLYSTYRENE SULFONATE 15 GM/60ML PO SUSP
30.0000 g | Freq: Once | ORAL | Status: AC
  Administered 2012-09-06: 30 g via ORAL
  Filled 2012-09-06: qty 120

## 2012-09-06 NOTE — Progress Notes (Signed)
Pt still anxious over taking kayexalate. Pt with no more vomiting , but very anxious over taking the medicine. Pt counseled over taking it. Pt still slightly nauseous especially when thinking about taking the medicine.

## 2012-09-06 NOTE — Progress Notes (Signed)
Pt discharged via wheelchair with significant other.

## 2012-09-06 NOTE — Progress Notes (Signed)
Went into patient room to give kayexalate and patient states that she is nauseous and feels like she is going to vomit. Patient vomited a moderate amount of food from breakfast. Pt stated her chest started hurting during throwing up and her left arm was numb. This pain and numbness went away on its own in 2 min. VS taken and were stable. Dr. Arthor Captain made aware of vomiting, chest pain and left arm numbness. Dr. Arthor Captain ordered zofran for pt. No other new orders. Continue to monitor patient. Went nausea gone will attempt to give Kayexalate.

## 2012-09-06 NOTE — Discharge Summary (Signed)
Physician Discharge Summary  [name]:[id number] DOB: January 03, 1971 DOA: 09/02/2012  PCP: Dalbert Mayotte, MD  Admit date: 09/02/2012 Discharge date: 09/06/2012  Time spent: 40 minutes minutes  Recommendations for Outpatient Follow-up:  1. Followup with primary care physician within one week. 2. Check BMP in one week.  Discharge Diagnoses:  Principal Problem:   Acute renal failure (ARF) Active Problems:   Morbidly obese   Diabetes mellitus type 2 with complications, uncontrolled   Sleep apnea   Hypothyroidism   Chronic pain   Oliguria and anuria   Discharge Condition: Stable  Diet recommendation: Carbohydrate modified diet  Filed Weights   09/02/12 2108  Weight: 195.954 kg (432 lb)    History of present illness:  [name] is an 42 y.o. female with hx of DM but not known to have diabetic nephropathy ( last Cr normal 08/26/12), arthritis on Celebrex 400mg  BID, HTN not on ACE-I or ARB, hx of nephrolithiasis, depression on Celexa prior but switch to Effexor about a month ago, presents to [facility] feeling malaise, decrease urinary output, and was found to have a Cr of 7.5. Her microscopic urinalysis showed no cell cast. She denied dysuria, fever, chills, or flank pain. Her K is 5.6, and her CBC was unremarkable. Bladder scan showed empty bladder. She has not taken any new meds OTC, and has not taken more Celebrex than prescribed. She has not been on diuretics, but hadn't taken much oral intake. Hospitalist was asked to admit her for ARF with Cr going from normal to 7.5 in 8 days. She has not had any contrast studies.   Hospital Course:   1. Acute renal failure: According to the records patient had normal renal function in June of 2013, patient came into the hospital because of generalized weakness and inability to pass urine. Patient said she recently traveled to [place] and she might not adequately hydrating herself, she also says he's taking Celebrex and although  benazepril is not his illicit medication she said she was been on it. Patient presented with creatinine of 7.5, ultrasound was done and showed no obstruction, nephrology was consulted and this thought to be secondary to dehydration plus the effect of the nephrotoxic medications. Patient started on aggressive hydration with IV fluids, she started to make a lot of urine, and her creatinine improved day by day. On discharge from the hospital creatinine is 1.3. Patient instructed to avoid NSAIDs, not to take benazepril till she sees her primary care physician. Probably patient will need ACE or ARB when her renal function plateau.  2. Diabetes mellitus type 2: Controlled diabetes mellitus with hemoglobin A1c of 6.4, patient is on 85 units of Lantus insulin at night, Janumet and VICTOZA. Asked to continue medications at home.  3. Chronic pain: Patient is on high-dose of narcotics of 400 mcg of fentanyl through patch, she is on Dilaudid and Percocet orally. Upon admission to the hospital fentanyl dose was cut down to half because of renal failure. Upon discharge patient said she is was hurting in the hospital and she wanted higher dose of the fentanyl. Her home medication restarted, I will probably refer her to a pain clinic.  4. Morbid obesity: Patient counseled extensively about weight loss, she was complaining about (a lump) sticking out of her stomach, CT scan of abdomen pelvis done and showed periumbilical hernia filled with fat.  Procedures:  None  Consultations:  Dr. Elvis Coil nephrology  Discharge Exam: Filed Vitals:   09/05/12 2103 09/06/12 0601 09/06/12  0905 09/06/12 1152  BP: 128/71 126/80 128/78 122/70  Pulse: 79 72 79 81  Temp: 98.3 F (36.8 C) 99.1 F (37.3 C) 98.8 F (37.1 C) 98.4 F (36.9 C)  TempSrc: Oral Oral Oral Oral  Resp: 20 20 20    Height:      Weight:      SpO2: 99% 99% 95%    General: Alert and awake, oriented x3, not in any acute distress. HEENT: anicteric  sclera, pupils reactive to light and accommodation, EOMI CVS: S1-S2 clear, no murmur rubs or gallops Chest: clear to auscultation bilaterally, no wheezing, rales or rhonchi Abdomen: soft nontender, nondistended, normal bowel sounds, no organomegaly Extremities: no cyanosis, clubbing or edema noted bilaterally Neuro: Cranial nerves II-XII intact, no focal neurological deficits  Discharge Instructions  Discharge Orders   Future Orders Complete By Expires     Diet Carb Modified  As directed     Increase activity slowly  As directed         Medication List    STOP taking these medications       celecoxib 400 MG capsule  Commonly known as:  CELEBREX     citalopram 20 MG tablet  Commonly known as:  CELEXA      TAKE these medications       atorvastatin 10 MG tablet  Commonly known as:  LIPITOR  Take 10 mg by mouth daily.     Biotin 5000 MCG Caps  Take 2 capsules by mouth 2 (two) times daily.     carisoprodol 350 MG tablet  Commonly known as:  SOMA  Take 350 mg by mouth 4 (four) times daily as needed. For back spasms.     desvenlafaxine 100 MG 24 hr tablet  Commonly known as:  PRISTIQ  Take 100 mg by mouth daily.     diazepam 5 MG tablet  Commonly known as:  VALIUM  Take 10 mg by mouth 4 (four) times daily as needed. Anxiety/sleep       esomeprazole 40 MG capsule  Commonly known as:  NEXIUM  Take 40 mg by mouth daily before breakfast.     fentaNYL 100 MCG/HR  Commonly known as:  DURAGESIC - dosed mcg/hr  Place 4 patches onto the skin every 3 (three) days.     gabapentin 300 MG capsule  Commonly known as:  NEURONTIN  Take 300-600 mg by mouth 2 (two) times daily. Take 600 MG in the morning, and 300 MG at night.     GARLIC PO  Take 1 tablet by mouth daily.     HYDROmorphone 8 MG tablet  Commonly known as:  DILAUDID  Take 8 mg by mouth every 4 (four) hours as needed. Pain       insulin glargine 100 UNIT/ML injection  Commonly known as:  LANTUS  Inject 85  Units into the skin at bedtime.     IRON COMPLEX PO  Take 2 tablets by mouth 2 (two) times daily.     levocetirizine 5 MG tablet  Commonly known as:  XYZAL  Take 5 mg by mouth every evening.     levothyroxine 125 MCG tablet  Commonly known as:  SYNTHROID, LEVOTHROID  Take 125 mcg by mouth daily.     metoCLOPramide 10 MG tablet  Commonly known as:  REGLAN  Take 10 mg by mouth daily as needed. For nausea     multivitamins ther. w/minerals Tabs  Take 1 tablet by mouth daily.     nystatin 100000  UNIT/GM Powd  Apply 1 g topically 2 (two) times daily.     oxyCODONE-acetaminophen 10-325 MG per tablet  Commonly known as:  PERCOCET  Take 2 tablets by mouth every 4 (four) hours as needed for pain.     pregabalin 75 MG capsule  Commonly known as:  LYRICA  Take 75 mg by mouth 3 (three) times daily.     sitaGLIPtan-metformin 50-1000 MG per tablet  Commonly known as:  JANUMET  Take 1 tablet by mouth 2 (two) times daily with a meal.     VICTOZA 18 MG/3ML Soln injection  Generic drug:  Liraglutide  Inject 1.8 mg into the skin every morning.     VITAMIN B-12 IJ  Inject 100 Units as directed every 30 (thirty) days.     VITAMIN D (CHOLECALCIFEROL) PO  Take 1 capsule by mouth daily.       Allergies  Allergen Reactions  . Sulfa Antibiotics Hives       Follow-up Information   Follow up with Dalbert Mayotte, MD.   Contact information:   [address] [place] [address] [phone]        The results of significant diagnostics from this hospitalization (including imaging, microbiology, ancillary and laboratory) are listed below for reference.    Significant Diagnostic Studies: Ct Abdomen Pelvis Wo Contrast  09/05/2012   *RADIOLOGY REPORT*  Clinical Data: Bilateral flank pain.  Palpable periumbilical mass.  CT ABDOMEN AND PELVIS WITHOUT CONTRAST  Technique:  Multidetector CT imaging of the abdomen and pelvis was performed following the standard  protocol without intravenous contrast.  Comparison: 05/20/2008  Findings: Surgical clips again seen from prior cholecystectomy. Noncontrast images of the liver, pancreas, spleen, adrenal glands, and kidneys are normal in appearance.  No soft tissue masses or lymphadenopathy identified within the abdomen or pelvis.  Uterus and adnexa are unremarkable in appearance.  No evidence of inflammatory process or abnormal fluid collections. No evidence of dilated bowel loops.  Mild diffuse subcutaneous body wall edema noted as well as area fat necrosis in the left buttock subcutaneous tissues.  A small periumbilical hernia is seen containing only omental fat.  There is no evidence of herniated bowel loops.  No suspicious bone lesions identified.  IMPRESSION:  1.  No evidence of urolithiasis, hydronephrosis, or other acute findings. 2.  Small periumbilical hernia containing only fat.   Original Report Authenticated By: Myles Rosenthal, M.D.   US Renal  09/03/2012   *RADIOLOGY REPORT*  Clinical Data: Acute renal failure.  Diabetes  RENAL/URINARY TRACT ULTRASOUND COMPLETE  Comparison:  CT abdomen 05/20/2008  Findings:  Right Kidney:  11.7 cm in length.  Negative for obstruction. Normal renal cortex without mass lesion.  Left Kidney:  12.5 cm in length.  Negative for obstruction or mass. Normal renal cortex.  Bladder:  Negative  IMPRESSION: Negative renal ultrasound.   Original Report Authenticated By: Janeece Riggers, M.D.    Microbiology: Recent Results (from the past 240 hour(s))  URINE CULTURE     Status: None   Collection Time    09/02/12  4:04 PM      Result Value Range Status   Specimen Description URINE, CATHETERIZED   Final   Special Requests NONE   Final   Culture  Setup Time 09/02/2012 22:19   Final   Colony Count NO GROWTH   Final   Culture NO GROWTH   Final   Report Status 09/03/2012 FINAL   Final  MRSA PCR SCREENING  Status: Abnormal   Collection Time    09/03/12 12:02 AM      Result Value Range  Status   MRSA by PCR POSITIVE (*) NEGATIVE Final   Comment:            The GeneXpert MRSA Assay (FDA     approved for NASAL specimens     only), is one component of a     comprehensive MRSA colonization     surveillance program. It is not     intended to diagnose MRSA     infection nor to guide or     monitor treatment for     MRSA infections.     RESULT CALLED TO, READ BACK BY AND VERIFIED WITH:     TO AMOHOMMED(RN) BY TCLEVELAND 6/12/2014AT 1:40AM     Labs: Basic Metabolic Panel:  Recent Labs Lab 09/02/12 1605 09/02/12 2337 09/03/12 1048 09/04/12 0745 09/05/12 0415 09/06/12 0548  NA 137  --  139 144 140 141  K 5.6*  --  5.1 5.3* 5.3* 5.3*  CL 102  --  106 110 110 111  CO2 19  --  20 23 20 25   GLUCOSE 131*  --  115* 176* 105* 144*  BUN 64*  --  66* 65* 49* 30*  CREATININE 7.50* 7.17* 7.18* 4.75* 2.17* 1.30*  CALCIUM 8.8  --  7.9* 8.3* 8.0* 8.0*  PHOS  --   --   --  6.0* 3.9 3.4   Liver Function Tests:  Recent Labs Lab 09/04/12 0745 09/05/12 0415 09/06/12 0548  ALBUMIN 3.3* 2.9* 3.0*   No results found for this basename: LIPASE, AMYLASE,  in the last 168 hours No results found for this basename: AMMONIA,  in the last 168 hours CBC:  Recent Labs Lab 09/02/12 1705 09/02/12 2337  WBC 10.7* 9.6  NEUTROABS 4.6  --   HGB 11.4* 11.0*  HCT 34.7* 32.3*  MCV 96.1 93.4  PLT 234 188   Cardiac Enzymes: No results found for this basename: CKTOTAL, CKMB, CKMBINDEX, TROPONINI,  in the last 168 hours BNP: BNP (last 3 results) No results found for this basename: PROBNP,  in the last 8760 hours CBG:  Recent Labs Lab 09/05/12 1225 09/05/12 1625 09/05/12 1758 09/05/12 2058 09/06/12 0805  GLUCAP 194* 185* 155* 215* 109*       Signed:  Jude Naclerio A  [facility] 09/06/2012, 12:02 PM

## 2012-09-06 NOTE — Progress Notes (Signed)
Discharge instructions went over with patient and significant other. Iv dc'd without trouble.  Pt feeling better. No issues at this time. Pt requesting pain medicine for back pain on ride home.

## 2012-09-06 NOTE — Progress Notes (Signed)
Kayexalate given, pt given zofran prior to taking. Pt gagging and chasing with pudding . Pt coughed and gagged some of the pudding up. Pt set for discharge today. Pts husband will be in around 6pm. Call light given and instructed to call for help to the bathroom.

## 2012-09-07 LAB — GLUCOSE, CAPILLARY
Glucose-Capillary: 163 mg/dL — ABNORMAL HIGH (ref 70–99)
Glucose-Capillary: 147 mg/dL — ABNORMAL HIGH (ref 70–99)

## 2013-01-28 ENCOUNTER — Other Ambulatory Visit: Payer: Self-pay

## 2013-04-20 DIAGNOSIS — M21379 Foot drop, unspecified foot: Secondary | ICD-10-CM | POA: Insufficient documentation

## 2013-07-14 ENCOUNTER — Telehealth (INDEPENDENT_AMBULATORY_CARE_PROVIDER_SITE_OTHER): Payer: Self-pay

## 2013-07-14 NOTE — Telephone Encounter (Signed)
Pt seen at [facility] in [place] where she had mammogram and [name].  She has been diagnosed with atypical cells of the breast and referred for biopsy. Family hx of breast cancer - Mother and sister.  Pt previously scheduled to see Dr. Zella Richer on 08/06/13.  She is extremely anxious and wanted a sooner appt.  I scheduled her for 07/21/13 at 8:30 a.m.  Is this okay?

## 2013-07-15 NOTE — Telephone Encounter (Signed)
Yes

## 2013-07-21 ENCOUNTER — Ambulatory Visit (INDEPENDENT_AMBULATORY_CARE_PROVIDER_SITE_OTHER): Payer: Medicare Other | Admitting: General Surgery

## 2013-07-21 ENCOUNTER — Encounter (INDEPENDENT_AMBULATORY_CARE_PROVIDER_SITE_OTHER): Payer: Self-pay | Admitting: General Surgery

## 2013-07-21 VITALS — BP 124/80 | HR 77 | Temp 97.6°F | Ht 65.0 in | Wt 323.2 lb

## 2013-07-21 DIAGNOSIS — R920 Mammographic microcalcification found on diagnostic imaging of breast: Secondary | ICD-10-CM | POA: Insufficient documentation

## 2013-07-21 NOTE — Progress Notes (Signed)
Patient ID: [name], female   DOB: 04-May-1970, 43 y.o.   MRN: [id number]  Chief Complaint  Patient presents with  . eval right breast    new pt    HPI [name] is a 43 y.o. female.   HPI  She is referred by Dr. Graylon Good for further evaluation and treatment of suspicious calcifications in the right breast on mammogram.  The mammogram was done at no [facility].  There was a discussion of a stereotactic biopsy but she is too heavy for the table.  She presents here for further evaluation and treatment. Her paternal grandmother had breast cancer. No first degree relatives with breast cancer. No breast pain. No breast masses. No nipple discharge. Age at menarche was 1. She is nulliparous. She is premenopausal.  She is here for her husband.  Past Medical History  Diagnosis Date  . Diabetes mellitus   . Hypertension   . Wound healing, delayed   . H/O: Bell's palsy 2011  . Depression   . Fibromyalgia   . DJD (degenerative joint disease)   . Asthma   . Right foot drop   . Hypoventilation associated with obesity syndrome   . Hypothyroidism   . Back pain   . GERD (gastroesophageal reflux disease)   . Peripheral vascular disease   . Heart murmur   . Shortness of breath     with Activity  . Chronic pain syndrome   . Non-healing non-surgical wound     Right hip, has Wound vac to hip.  Started as a skin tear.  . Chronic heel ulcer   . Blood transfusion   . Migraine   . History of MRSA infection OF ULCER  . PONV (postoperative nausea and vomiting)   . Dysrhythmia   . Sleep apnea     "study shows not bad enough for CPCP."  . Chronic kidney disease     Kidney Stone  . Anxiety     Past Surgical History  Procedure Laterality Date  . Right elbow    . Back surgery      for lumbar disc disease X2  . 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 Cholecystectomy  1984  . I&d extremity  04/02/2011    Procedure: IRRIGATION AND DEBRIDEMENT  EXTREMITY;  Surgeon: Mcarthur Rossetti;  Location: [facility];  Service: Orthopedics;  Laterality: Right;  I&D right heel ulcer, placement of A-cell graft  . Brain surgery       Plating due to soft spot closing too early- age 3  . Lithotripsy      2007ish  . Incision and drainage of wound  08/29/2011    Procedure: IRRIGATION AND DEBRIDEMENT WOUND;  Surgeon: Theodoro Kos, DO;  Location: [facility];  Service: Plastics;  Laterality: Right;    Family History  Problem Relation Age of Onset  . Diabetes type II Father   . Diabetes type II Mother   . Anesthesia problems Mother     Social History History  Substance Use Topics  . Smoking status: Never Smoker   . Smokeless tobacco: Never Used  . Alcohol Use: No    Allergies  Allergen Reactions  . Sulfa Antibiotics Hives    Current Outpatient Prescriptions  Medication Sig Dispense Refill  . atorvastatin (LIPITOR) 10 MG tablet Take 10 mg by mouth daily.        . Biotin 5000 MCG CAPS Take 2 capsules by mouth 2 (  two) times daily.        . carisoprodol (SOMA) 350 MG tablet Take 350 mg by mouth 4 (four) times daily as needed. For back spasms.      . Cyanocobalamin (VITAMIN B-12 IJ) Inject 100 Units as directed every 30 (thirty) days.       Marland Kitchen desvenlafaxine (PRISTIQ) 100 MG 24 hr tablet Take 100 mg by mouth daily.      . diazepam (VALIUM) 5 MG tablet Take 10 mg by mouth 4 (four) times daily as needed. Anxiety/sleep       . esomeprazole (NEXIUM) 40 MG capsule Take 40 mg by mouth daily before breakfast.       . fentaNYL (DURAGESIC - DOSED MCG/HR) 100 MCG/HR Place 4 patches onto the skin every 3 (three) days.       Marland Kitchen gabapentin (NEURONTIN) 300 MG capsule Take 300-600 mg by mouth 2 (two) times daily. Take 600 MG in the morning, and 300 MG at night.      Marland Kitchen GARLIC PO Take 1 tablet by mouth daily.        . insulin glargine (LANTUS) 100 UNIT/ML injection Inject 85 Units into the skin at bedtime.       . Iron Combinations (IRON COMPLEX PO) Take 2 tablets  by mouth 2 (two) times daily.        Marland Kitchen levocetirizine (XYZAL) 5 MG tablet Take 5 mg by mouth every evening.      Marland Kitchen levothyroxine (SYNTHROID, LEVOTHROID) 125 MCG tablet Take 125 mcg by mouth daily.      . Liraglutide (VICTOZA) 18 MG/3ML SOLN Inject 1.8 mg into the skin every morning.       . metoCLOPramide (REGLAN) 10 MG tablet Take 10 mg by mouth daily as needed. For nausea      . Multiple Vitamins-Minerals (MULTIVITAMINS THER. W/MINERALS) TABS Take 1 tablet by mouth daily.        Marland Kitchen nystatin (MYCOSTATIN/NYSTOP) 100000 UNIT/GM POWD Apply 1 g topically 2 (two) times daily.      Marland Kitchen oxyCODONE-acetaminophen (PERCOCET) 10-325 MG per tablet Take 2 tablets by mouth every 4 (four) hours as needed for pain.       . pregabalin (LYRICA) 75 MG capsule Take 75 mg by mouth 3 (three) times daily.       . sitaGLIPtan-metformin (JANUMET) 50-1000 MG per tablet Take 1 tablet by mouth 2 (two) times daily with a meal.       . VITAMIN D, CHOLECALCIFEROL, PO Take 1 capsule by mouth daily.        No current facility-administered medications for this visit.    Review of Systems Review of Systems  Constitutional:       She has lost 180 pounds intentionally.  Respiratory: Negative.   Cardiovascular: Negative.   Gastrointestinal: Negative.   Genitourinary: Negative.   Musculoskeletal: Positive for arthralgias.  Neurological: Positive for weakness and headaches.    Blood pressure 124/80, pulse 77, temperature 97.6 F (36.4 C), height 5\' 5"  (1.651 m), weight 323 lb 3.2 oz (146.603 kg).  Physical Exam Physical Exam  Constitutional: No distress.  Morbidly obese  HENT:  Head: Normocephalic and atraumatic.  Eyes: EOM are normal. No scleral icterus.  Neck: Neck supple.  Cardiovascular: Normal rate and regular rhythm.   Pulmonary/Chest: Effort normal and breath sounds normal.  Breasts are large, pendulous, and symmetrical in size. No dominant masses in either breast. No nipple discharge or suspicious skin changes.   Abdominal: Soft.  Obese. Right upper quadrant  scar.  Musculoskeletal:  No palpable supraclavicular or axillary adenopathy.  Lymphadenopathy:    She has no cervical adenopathy.  Neurological: She is alert.  Skin: Skin is warm and dry.  Psychiatric: She has a normal mood and affect. Her behavior is normal.    Data Reviewed Mammogram report.  Films unavailable for my review.  Assessment    Right breast suspicious calcifications lower inner quadrant. Mammograms are not available for my review. No ultrasound has been done.    Plan    Obtain mammograms from outside facility.  After I review these, I will discuss a referral with our breast center to see she could have an image guided biopsy. If not, would need to do an open biopsy after needle localization.I have explained the procedure, risks, and aftercare of the surgery with her if that becomes necesssary.  Risks include but are not limited to bleeding, infection, wound problems, seroma formation, anesthesia.  She seems to understand and agrees with the plan.       Rhunette Croft Ayano Douthitt 07/21/2013, 9:57 AM

## 2013-07-21 NOTE — Patient Instructions (Signed)
I will review the mammogram. We will then make a referral to the breast center and see if they can do an image guided biopsy. If they cannot do an image guided biopsy, we will schedule a surgical biopsy as discussed.

## 2013-07-26 ENCOUNTER — Telehealth (INDEPENDENT_AMBULATORY_CARE_PROVIDER_SITE_OTHER): Payer: Self-pay

## 2013-07-26 NOTE — Telephone Encounter (Signed)
Pending breast bx/surgery when we receive pt's films. Pt is concerned about having surgery and lying on her back for an extended amount of time.  She has severe pain in the lower lumbar spine.  She will need back surgery, but cannot be scheduled until she loses around 100 lbs.  Dr. Zella Richer to be made aware.

## 2013-07-27 NOTE — Telephone Encounter (Signed)
Noted  

## 2013-07-28 NOTE — Telephone Encounter (Signed)
Pt made aware that films rec'd and reviewed by Dr. Zella Richer.  He spoke with radiologist at [facility] and films were delivered upstairs for review.  They will contact us tomorrow.  Will call the patient after speaking with the radiologist.

## 2013-07-29 ENCOUNTER — Encounter (INDEPENDENT_AMBULATORY_CARE_PROVIDER_SITE_OTHER): Payer: Self-pay | Admitting: General Surgery

## 2013-07-29 DIAGNOSIS — R928 Other abnormal and inconclusive findings on diagnostic imaging of breast: Secondary | ICD-10-CM

## 2013-07-29 NOTE — Progress Notes (Signed)
Patient ID: [name], female   DOB: 19-Apr-1970, 44 y.o.   MRN: [id number] I have reviewed the films. The radiologist at the breast center did not think they could find this by way of ultrasound. Thus I recommended we proceed with open wire-guided right breast lumpectomy and she is in agreement. We will work on getting a scheduled.

## 2013-08-06 ENCOUNTER — Ambulatory Visit (INDEPENDENT_AMBULATORY_CARE_PROVIDER_SITE_OTHER): Payer: Medicare Other | Admitting: General Surgery

## 2013-08-06 ENCOUNTER — Other Ambulatory Visit (INDEPENDENT_AMBULATORY_CARE_PROVIDER_SITE_OTHER): Payer: Self-pay | Admitting: General Surgery

## 2013-08-06 DIAGNOSIS — R928 Other abnormal and inconclusive findings on diagnostic imaging of breast: Secondary | ICD-10-CM

## 2013-08-17 ENCOUNTER — Encounter (HOSPITAL_COMMUNITY): Payer: Self-pay | Admitting: Pharmacy Technician

## 2013-08-18 ENCOUNTER — Encounter (INDEPENDENT_AMBULATORY_CARE_PROVIDER_SITE_OTHER): Payer: Self-pay

## 2013-08-18 ENCOUNTER — Other Ambulatory Visit (HOSPITAL_COMMUNITY): Payer: Medicare Other

## 2013-08-19 ENCOUNTER — Ambulatory Visit (HOSPITAL_COMMUNITY)
Admission: RE | Admit: 2013-08-19 | Discharge: 2013-08-19 | Disposition: A | Discharge status: Home / Self Care | Payer: Medicare Other | Source: Ambulatory Visit | Attending: Anesthesiology | Admitting: Anesthesiology

## 2013-08-19 ENCOUNTER — Encounter (HOSPITAL_COMMUNITY)
Admission: RE | Admit: 2013-08-19 | Discharge: 2013-08-19 | Disposition: A | Discharge status: Home / Self Care | Payer: Medicare Other | Source: Ambulatory Visit | Attending: General Surgery | Admitting: General Surgery

## 2013-08-19 ENCOUNTER — Encounter (HOSPITAL_COMMUNITY): Payer: Self-pay

## 2013-08-19 DIAGNOSIS — I10 Essential (primary) hypertension: Secondary | ICD-10-CM | POA: Insufficient documentation

## 2013-08-19 DIAGNOSIS — Z01818 Encounter for other preprocedural examination: Secondary | ICD-10-CM | POA: Insufficient documentation

## 2013-08-19 DIAGNOSIS — Z01812 Encounter for preprocedural laboratory examination: Secondary | ICD-10-CM | POA: Insufficient documentation

## 2013-08-19 DIAGNOSIS — Z5189 Encounter for other specified aftercare: Secondary | ICD-10-CM | POA: Insufficient documentation

## 2013-08-19 DIAGNOSIS — E662 Morbid (severe) obesity with alveolar hypoventilation: Secondary | ICD-10-CM | POA: Insufficient documentation

## 2013-08-19 LAB — COMPREHENSIVE METABOLIC PANEL
ALT: 25 U/L (ref 0–35)
AST: 25 U/L (ref 0–37)
Albumin: 3.7 g/dL (ref 3.5–5.2)
Alkaline Phosphatase: 86 U/L (ref 39–117)
BUN: 21 mg/dL (ref 6–23)
CO2: 19 mEq/L (ref 19–32)
Calcium: 9.2 mg/dL (ref 8.4–10.5)
Chloride: 101 mEq/L (ref 96–112)
Creatinine, Ser: 0.91 mg/dL (ref 0.50–1.10)
GFR calc Af Amer: 87 mL/min — ABNORMAL LOW (ref >90)
GFR calc non Af Amer: 75 mL/min — ABNORMAL LOW (ref >90)
Glucose, Bld: 263 mg/dL — ABNORMAL HIGH (ref 70–99)
Potassium: 4.1 mEq/L (ref 3.7–5.3)
Sodium: 136 mEq/L — ABNORMAL LOW (ref 137–147)
Total Bilirubin: 0.3 mg/dL (ref 0.3–1.2)
Total Protein: 7.2 g/dL (ref 6.0–8.3)

## 2013-08-19 LAB — HCG, SERUM, QUALITATIVE: Preg, Serum: NEGATIVE

## 2013-08-19 LAB — CBC WITH DIFFERENTIAL/PLATELET
Basophils Absolute: 0 10*3/uL (ref 0.0–0.1)
Basophils Relative: 0 % (ref 0–1)
Eosinophils Absolute: 0 10*3/uL (ref 0.0–0.7)
Eosinophils Relative: 0 % (ref 0–5)
HCT: 40.4 % (ref 36.0–46.0)
Hemoglobin: 13.7 g/dL (ref 12.0–15.0)
Lymphocytes Relative: 34 % (ref 12–46)
Lymphs Abs: 4 10*3/uL (ref 0.7–4.0)
MCH: 30.9 pg (ref 26.0–34.0)
MCHC: 33.9 g/dL (ref 30.0–36.0)
MCV: 91 fL (ref 78.0–100.0)
Monocytes Absolute: 0.6 10*3/uL (ref 0.1–1.0)
Monocytes Relative: 5 % (ref 3–12)
Neutro Abs: 7.1 10*3/uL (ref 1.7–7.7)
Neutrophils Relative %: 61 % (ref 43–77)
Platelets: 219 10*3/uL (ref 150–400)
RBC: 4.44 MIL/uL (ref 3.87–5.11)
RDW: 13 % (ref 11.5–15.5)
WBC: 11.6 10*3/uL — ABNORMAL HIGH (ref 4.0–10.5)

## 2013-08-19 LAB — PROTIME-INR
INR: 1.01 (ref 0.00–1.49)
Prothrombin Time: 13.1 seconds (ref 11.6–15.2)

## 2013-08-19 LAB — SURGICAL PCR SCREEN
MRSA, PCR: POSITIVE — AB
Staphylococcus aureus: POSITIVE — AB

## 2013-08-19 IMAGING — CR DG CHEST 2V
2 series · 2 of 2 positions shown · non-contrast
Comparison: [DATE]

CLINICAL DATA: Preop right breast surgery.  Hypertension.

EXAM:
CHEST  2 VIEW

[w chest pa]
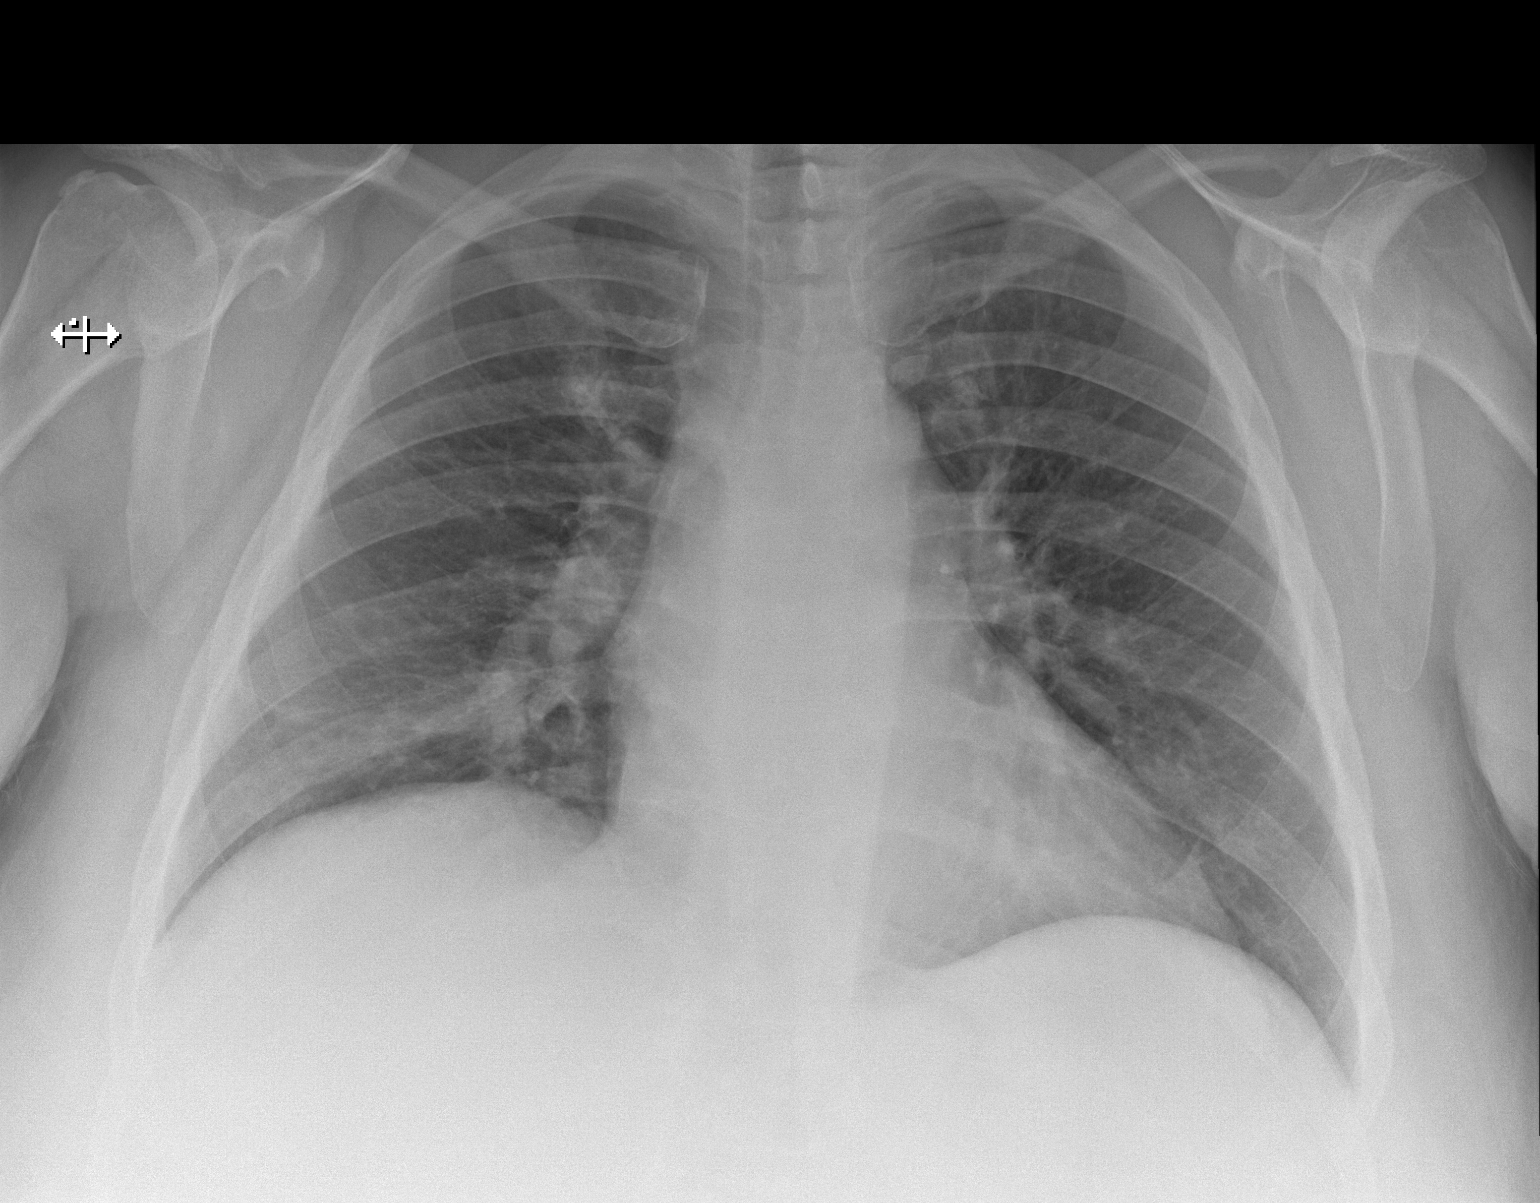

[w chest lat]
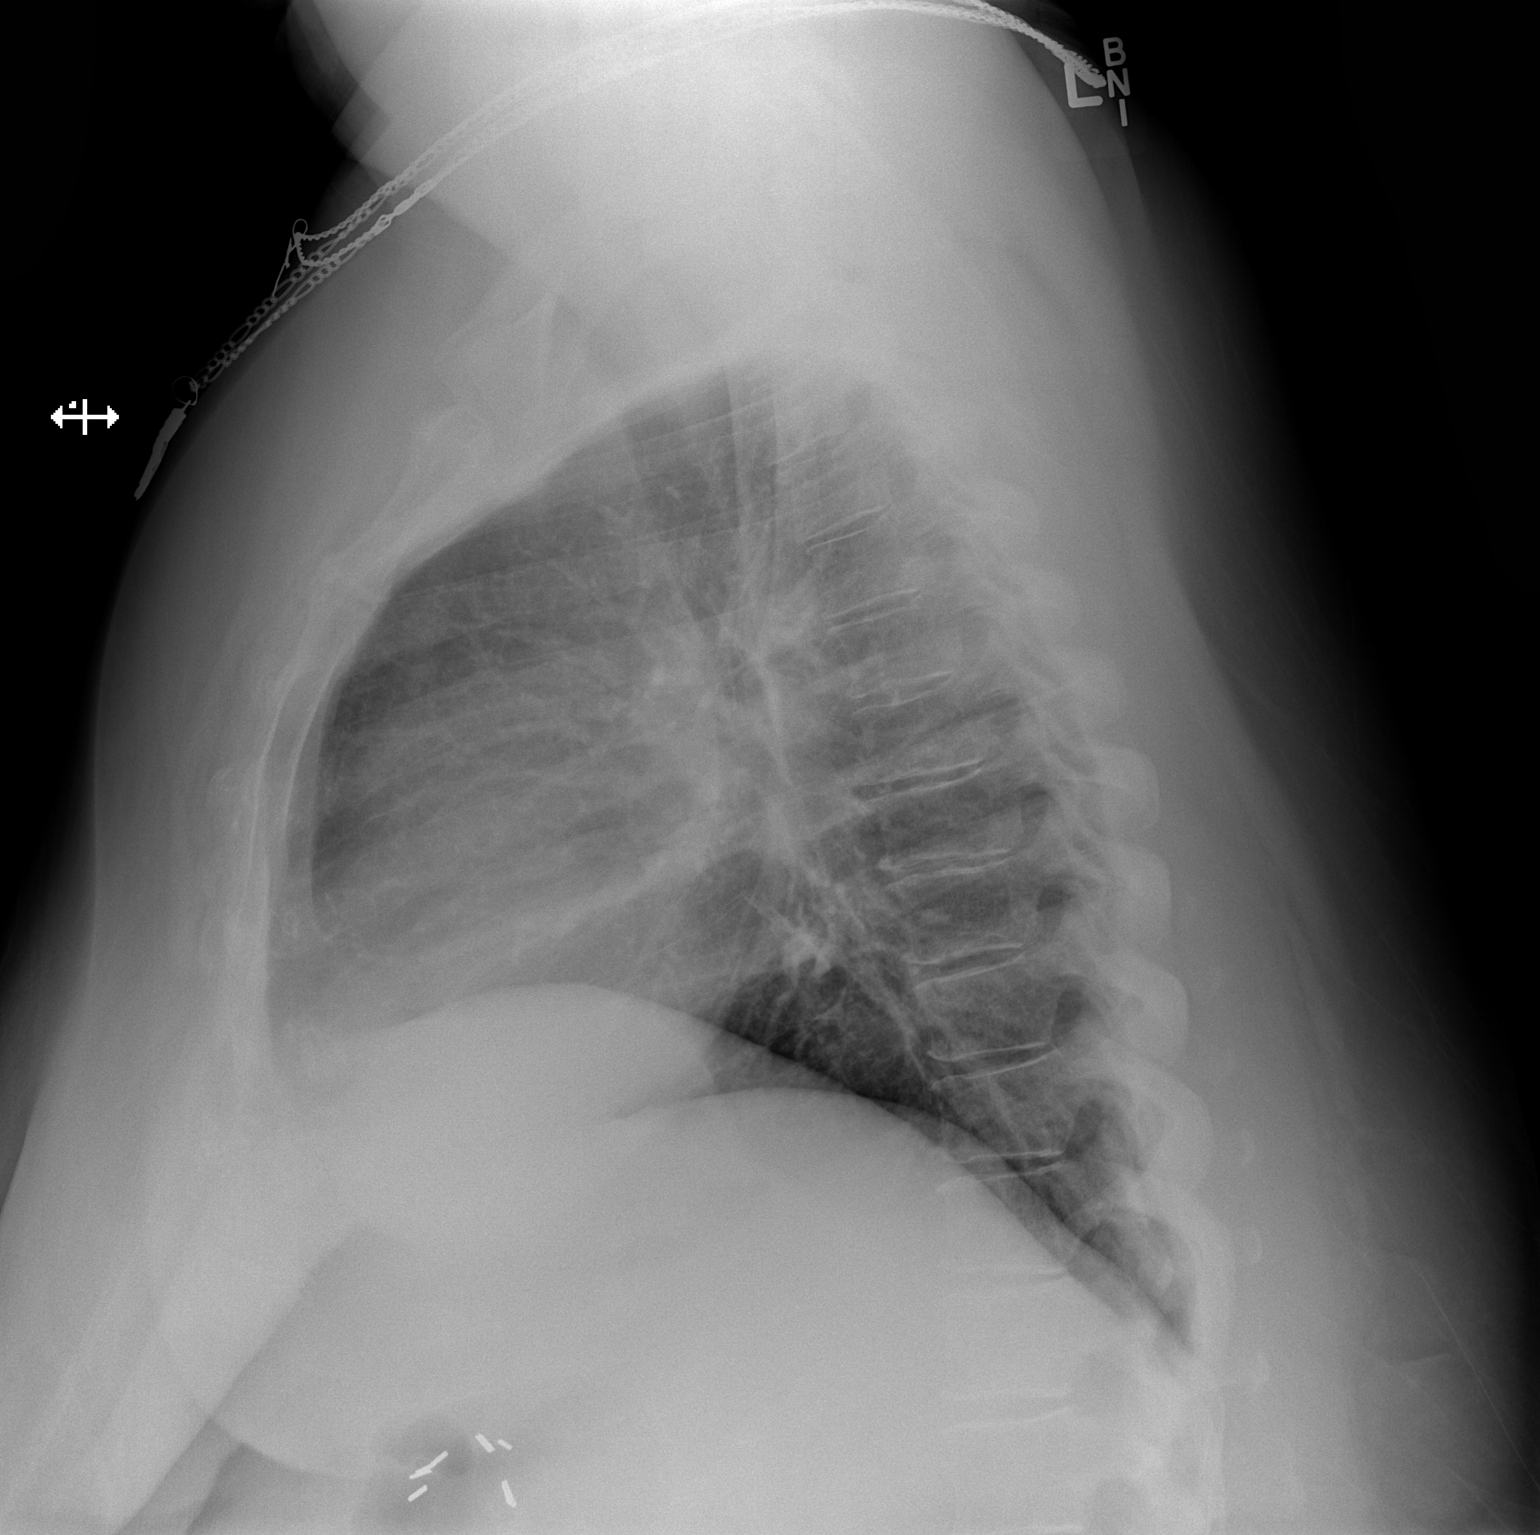

[2 of 2 positions shown; findings below may reference images not displayed]

FINDINGS: The heart size and mediastinal contours are within normal limits.
Both lungs are clear. The visualized skeletal structures are
unremarkable.
IMPRESSION: No active cardiopulmonary disease.

## 2013-08-19 NOTE — Progress Notes (Signed)
Primary physician - dr. Bryson Ha synder Cardiologist - dr. Wynonia Lawman - last saw 3 years ago ekg in epic from 2014, echo in epic from 2010

## 2013-08-19 NOTE — Pre-Procedure Instructions (Signed)
[name]  08/19/2013   Your procedure is scheduled on:  Monday, June 1st  Report to [facility] Once you are finished at the breast center.  Call this number if you have problems the morning of surgery: [phone]   Remember:   Do not eat food or drink liquids after midnight.   Take these medicines the morning of surgery with A SIP OF WATER: abilify, pristiq, valium if needed, nexium, neurontin, synthroid, percocet if needed  Do NOT take any diabetes medication/insulin on morning of surgery.  Stop taking over the counter vitamins/herbal medications, aspirin, NSAIDS (ibuprofen, advil, motrin) as of today.   Do not wear jewelry, make-up or nail polish.  Do not wear lotions, powders, or perfumes, deodorant.  Do not shave 48 hours prior to surgery. Men may shave face and neck.  Do not bring valuables to the hospital.  [facility] is not responsible  for any belongings or valuables.               Contacts, dentures or bridgework may not be worn into surgery.  Leave suitcase in the car. After surgery it may be brought to your room.  For patients admitted to the hospital, discharge time is determined by your  treatment team.               Patients discharged the day of surgery will not be allowed to drive home.  Please read over the following fact sheets that you were given: Pain Booklet, Coughing and Deep Breathing and Surgical Site Infection Prevention [place] - Preparing for Surgery  Before surgery, you can play an important role.  Because skin is not sterile, your skin needs to be as free of germs as possible.  You can reduce the number of germs on you skin by washing with CHG (chlorahexidine gluconate) soap before surgery.  CHG is an antiseptic cleaner which kills germs and bonds with the skin to continue killing germs even after washing.  Please DO NOT use if you have an allergy to CHG or antibacterial soaps.  If your skin becomes reddened/irritated  stop using the CHG and inform your nurse when you arrive at Short Stay.  Do not shave (including legs and underarms) for at least 48 hours prior to the first CHG shower.  You may shave your face.  Please follow these instructions carefully:   1.  Shower with CHG Soap the night before surgery and the morning of Surgery.  2.  If you choose to wash your hair, wash your hair first as usual with your normal shampoo.  3.  After you shampoo, rinse your hair and body thoroughly to remove the shampoo.  4.  Use CHG as you would any other liquid soap.  You can apply CHG directly to the skin and wash gently with scrungie or a clean washcloth.  5.  Apply the CHG Soap to your body ONLY FROM THE NECK DOWN.  Do not use on open wounds or open sores.  Avoid contact with your eyes, ears, mouth and genitals (private parts).  Wash genitals (private parts) with your normal soap.  6.  Wash thoroughly, paying special attention to the area where your surgery will be performed.  7.  Thoroughly rinse your body with warm water from the neck down.  8.  DO NOT shower/wash with your normal soap after using and rinsing off the CHG Soap.  9.  Pat yourself dry with a clean towel.  10.  Wear clean pajamas.            11.  Place clean sheets on your bed the night of your first shower and do not sleep with pets.  Day of Surgery  Do not apply any lotions/deoderants the morning of surgery.  Please wear clean clothes to the hospital/surgery center.

## 2013-08-19 NOTE — Progress Notes (Signed)
I called a prescription to [facility], [place]

## 2013-08-23 ENCOUNTER — Ambulatory Visit (HOSPITAL_COMMUNITY)
Admission: RE | Admit: 2013-08-23 | Discharge: 2013-08-23 | Disposition: A | Discharge status: Home / Self Care | Payer: Medicare Other | Source: Ambulatory Visit | Attending: General Surgery | Admitting: General Surgery

## 2013-08-23 ENCOUNTER — Encounter (HOSPITAL_COMMUNITY): Payer: Medicare Other | Admitting: Anesthesiology

## 2013-08-23 ENCOUNTER — Ambulatory Visit
Admission: RE | Admit: 2013-08-23 | Discharge: 2013-08-23 | Disposition: A | Discharge status: Home / Self Care | Payer: Medicare Other | Source: Ambulatory Visit | Attending: General Surgery | Admitting: General Surgery

## 2013-08-23 ENCOUNTER — Encounter (HOSPITAL_COMMUNITY): Payer: Self-pay | Admitting: *Deleted

## 2013-08-23 ENCOUNTER — Ambulatory Visit (HOSPITAL_COMMUNITY): Payer: Medicare Other | Admitting: Anesthesiology

## 2013-08-23 ENCOUNTER — Encounter (HOSPITAL_COMMUNITY)
Admission: RE | Disposition: A | Discharge status: Home / Self Care | Payer: Self-pay | Source: Ambulatory Visit | Attending: General Surgery

## 2013-08-23 DIAGNOSIS — N189 Chronic kidney disease, unspecified: Secondary | ICD-10-CM | POA: Insufficient documentation

## 2013-08-23 DIAGNOSIS — IMO0001 Reserved for inherently not codable concepts without codable children: Secondary | ICD-10-CM | POA: Insufficient documentation

## 2013-08-23 DIAGNOSIS — K219 Gastro-esophageal reflux disease without esophagitis: Secondary | ICD-10-CM | POA: Insufficient documentation

## 2013-08-23 DIAGNOSIS — E119 Type 2 diabetes mellitus without complications: Secondary | ICD-10-CM | POA: Insufficient documentation

## 2013-08-23 DIAGNOSIS — Z794 Long term (current) use of insulin: Secondary | ICD-10-CM | POA: Insufficient documentation

## 2013-08-23 DIAGNOSIS — R920 Mammographic microcalcification found on diagnostic imaging of breast: Secondary | ICD-10-CM

## 2013-08-23 DIAGNOSIS — F329 Major depressive disorder, single episode, unspecified: Secondary | ICD-10-CM | POA: Insufficient documentation

## 2013-08-23 DIAGNOSIS — D249 Benign neoplasm of unspecified breast: Secondary | ICD-10-CM | POA: Insufficient documentation

## 2013-08-23 DIAGNOSIS — G4730 Sleep apnea, unspecified: Secondary | ICD-10-CM | POA: Insufficient documentation

## 2013-08-23 DIAGNOSIS — R928 Other abnormal and inconclusive findings on diagnostic imaging of breast: Secondary | ICD-10-CM

## 2013-08-23 DIAGNOSIS — I739 Peripheral vascular disease, unspecified: Secondary | ICD-10-CM | POA: Insufficient documentation

## 2013-08-23 DIAGNOSIS — F411 Generalized anxiety disorder: Secondary | ICD-10-CM | POA: Insufficient documentation

## 2013-08-23 DIAGNOSIS — I129 Hypertensive chronic kidney disease with stage 1 through stage 4 chronic kidney disease, or unspecified chronic kidney disease: Secondary | ICD-10-CM | POA: Insufficient documentation

## 2013-08-23 DIAGNOSIS — F3289 Other specified depressive episodes: Secondary | ICD-10-CM | POA: Insufficient documentation

## 2013-08-23 DIAGNOSIS — Z79899 Other long term (current) drug therapy: Secondary | ICD-10-CM | POA: Insufficient documentation

## 2013-08-23 DIAGNOSIS — E039 Hypothyroidism, unspecified: Secondary | ICD-10-CM | POA: Insufficient documentation

## 2013-08-23 HISTORY — PX: BREAST LUMPECTOMY WITH NEEDLE LOCALIZATION: SHX5759

## 2013-08-23 LAB — GLUCOSE, CAPILLARY
Glucose-Capillary: 214 mg/dL — ABNORMAL HIGH (ref 70–99)
Glucose-Capillary: 205 mg/dL — ABNORMAL HIGH (ref 70–99)
Glucose-Capillary: 173 mg/dL — ABNORMAL HIGH (ref 70–99)

## 2013-08-23 SURGERY — BREAST LUMPECTOMY WITH NEEDLE LOCALIZATION
Anesthesia: General | Site: Breast | Laterality: Right

## 2013-08-23 MED ORDER — FENTANYL CITRATE 0.05 MG/ML IJ SOLN
INTRAMUSCULAR | Status: AC
  Filled 2013-08-23: qty 2

## 2013-08-23 MED ORDER — ACETAMINOPHEN 160 MG/5ML PO SOLN
325.0000 mg | ORAL | Status: DC | PRN
  Filled 2013-08-23: qty 20.3

## 2013-08-23 MED ORDER — FENTANYL CITRATE 0.05 MG/ML IJ SOLN
25.0000 ug | INTRAMUSCULAR | Status: DC | PRN
  Administered 2013-08-23 (×3): 50 ug via INTRAVENOUS

## 2013-08-23 MED ORDER — PROPOFOL 10 MG/ML IV BOLUS
INTRAVENOUS | Status: AC
  Filled 2013-08-23: qty 20

## 2013-08-23 MED ORDER — ONDANSETRON HCL 4 MG/2ML IJ SOLN: 4.0000 mg | Freq: Once | INTRAMUSCULAR | Status: DC | PRN

## 2013-08-23 MED ORDER — DEXTROSE 5 % IV SOLN
3.0000 g | INTRAVENOUS | Status: AC
  Administered 2013-08-23: 3 g via INTRAVENOUS
  Filled 2013-08-23: qty 3000

## 2013-08-23 MED ORDER — HYDROCODONE-ACETAMINOPHEN 5-325 MG PO TABS: 1.0000 | ORAL_TABLET | ORAL | Status: DC | PRN

## 2013-08-23 MED ORDER — ACETAMINOPHEN 325 MG PO TABS: 325.0000 mg | ORAL_TABLET | ORAL | Status: DC | PRN

## 2013-08-23 MED ORDER — ONDANSETRON HCL 4 MG/2ML IJ SOLN
INTRAMUSCULAR | Status: AC
  Filled 2013-08-23: qty 2

## 2013-08-23 MED ORDER — 0.9 % SODIUM CHLORIDE (POUR BTL) OPTIME
TOPICAL | Status: DC | PRN
  Administered 2013-08-23: 1000 mL

## 2013-08-23 MED ORDER — ARTIFICIAL TEARS OP OINT
TOPICAL_OINTMENT | OPHTHALMIC | Status: DC | PRN
  Administered 2013-08-23: 1 via OPHTHALMIC

## 2013-08-23 MED ORDER — LIDOCAINE HCL (CARDIAC) 20 MG/ML IV SOLN
INTRAVENOUS | Status: DC | PRN
  Administered 2013-08-23: 100 mg via INTRAVENOUS

## 2013-08-23 MED ORDER — FENTANYL CITRATE 0.05 MG/ML IJ SOLN
INTRAMUSCULAR | Status: DC | PRN
  Administered 2013-08-23 (×2): 50 ug via INTRAVENOUS
  Administered 2013-08-23: 100 ug via INTRAVENOUS
  Administered 2013-08-23: 50 ug via INTRAVENOUS

## 2013-08-23 MED ORDER — PHENYLEPHRINE 40 MCG/ML (10ML) SYRINGE FOR IV PUSH (FOR BLOOD PRESSURE SUPPORT)
PREFILLED_SYRINGE | INTRAVENOUS | Status: AC
  Filled 2013-08-23: qty 10

## 2013-08-23 MED ORDER — OXYCODONE HCL 5 MG PO TABS
5.0000 mg | ORAL_TABLET | Freq: Once | ORAL | Status: AC | PRN
  Administered 2013-08-23: 5 mg via ORAL

## 2013-08-23 MED ORDER — BUPIVACAINE HCL (PF) 0.25 % IJ SOLN
INTRAMUSCULAR | Status: AC
  Filled 2013-08-23: qty 30

## 2013-08-23 MED ORDER — ARTIFICIAL TEARS OP OINT
TOPICAL_OINTMENT | OPHTHALMIC | Status: AC
  Filled 2013-08-23: qty 3.5

## 2013-08-23 MED ORDER — FENTANYL CITRATE 0.05 MG/ML IJ SOLN
INTRAMUSCULAR | Status: AC
  Filled 2013-08-23: qty 5

## 2013-08-23 MED ORDER — PROPOFOL 10 MG/ML IV BOLUS
INTRAVENOUS | Status: DC | PRN
  Administered 2013-08-23: 200 mg via INTRAVENOUS

## 2013-08-23 MED ORDER — OXYCODONE HCL 5 MG PO TABS: 5.0000 mg | ORAL_TABLET | ORAL | Status: DC | PRN

## 2013-08-23 MED ORDER — ONDANSETRON HCL 4 MG/2ML IJ SOLN
INTRAMUSCULAR | Status: AC
  Filled 2013-08-23: qty 4

## 2013-08-23 MED ORDER — MIDAZOLAM HCL 2 MG/2ML IJ SOLN
INTRAMUSCULAR | Status: AC
  Filled 2013-08-23: qty 2

## 2013-08-23 MED ORDER — OXYCODONE HCL 5 MG PO TABS
ORAL_TABLET | ORAL | Status: AC
  Filled 2013-08-23: qty 1

## 2013-08-23 MED ORDER — EPHEDRINE SULFATE 50 MG/ML IJ SOLN
INTRAMUSCULAR | Status: DC | PRN
  Administered 2013-08-23: 10 mg via INTRAVENOUS
  Administered 2013-08-23 (×4): 5 mg via INTRAVENOUS

## 2013-08-23 MED ORDER — ONDANSETRON HCL 4 MG/2ML IJ SOLN
INTRAMUSCULAR | Status: DC | PRN
Start: 2013-08-23 — End: 2013-08-23
  Administered 2013-08-23 (×2): 4 mg via INTRAVENOUS

## 2013-08-23 MED ORDER — LACTATED RINGERS IV SOLN
INTRAVENOUS | Status: DC
  Administered 2013-08-23 (×2): via INTRAVENOUS

## 2013-08-23 MED ORDER — MIDAZOLAM HCL 5 MG/5ML IJ SOLN
INTRAMUSCULAR | Status: DC | PRN
  Administered 2013-08-23: 2 mg via INTRAVENOUS

## 2013-08-23 MED ORDER — OXYCODONE HCL 5 MG/5ML PO SOLN: 5.0000 mg | Freq: Once | ORAL | Status: AC | PRN

## 2013-08-23 MED ORDER — ROCURONIUM BROMIDE 50 MG/5ML IV SOLN
INTRAVENOUS | Status: AC
  Filled 2013-08-23: qty 1

## 2013-08-23 MED ORDER — SUCCINYLCHOLINE CHLORIDE 20 MG/ML IJ SOLN
INTRAMUSCULAR | Status: DC | PRN
Start: 2013-08-23 — End: 2013-08-23
  Administered 2013-08-23: 120 mg via INTRAVENOUS

## 2013-08-23 MED ORDER — PHENYLEPHRINE HCL 10 MG/ML IJ SOLN
INTRAMUSCULAR | Status: DC | PRN
  Administered 2013-08-23: 120 ug via INTRAVENOUS
  Administered 2013-08-23: 80 ug via INTRAVENOUS
  Administered 2013-08-23: 120 ug via INTRAVENOUS
  Administered 2013-08-23 (×2): 80 ug via INTRAVENOUS
  Administered 2013-08-23: 40 ug via INTRAVENOUS
  Administered 2013-08-23 (×2): 80 ug via INTRAVENOUS
  Administered 2013-08-23 (×2): 120 ug via INTRAVENOUS

## 2013-08-23 SURGICAL SUPPLY — 44 items
APL SKNCLS STERI-STRIP NONHPOA (GAUZE/BANDAGES/DRESSINGS) ×1
BENZOIN TINCTURE PRP APPL 2/3 (GAUZE/BANDAGES/DRESSINGS) ×2 IMPLANT
BINDER BREAST LRG (GAUZE/BANDAGES/DRESSINGS) IMPLANT
BINDER BREAST XLRG (GAUZE/BANDAGES/DRESSINGS) IMPLANT
BINDER BREAST XXLRG (GAUZE/BANDAGES/DRESSINGS) ×1 IMPLANT
CANISTER SUCTION 2500CC (MISCELLANEOUS) ×2 IMPLANT
CHLORAPREP W/TINT 26ML (MISCELLANEOUS) ×2 IMPLANT
COVER SURGICAL LIGHT HANDLE (MISCELLANEOUS) ×2 IMPLANT
DECANTER SPIKE VIAL GLASS SM (MISCELLANEOUS) ×1 IMPLANT
DEVICE DUBIN SPECIMEN MAMMOGRA (MISCELLANEOUS) ×2 IMPLANT
DRAPE CHEST BREAST 15X10 FENES (DRAPES) ×2 IMPLANT
DRAPE UTILITY 15X26 W/TAPE STR (DRAPE) ×4 IMPLANT
DRSG OPSITE 4X5.5 SM (GAUZE/BANDAGES/DRESSINGS) ×2 IMPLANT
DRSG TEGADERM 4X4.75 (GAUZE/BANDAGES/DRESSINGS) ×1 IMPLANT
ELECT CAUTERY BLADE 6.4 (BLADE) ×2 IMPLANT
ELECT REM PT RETURN 9FT ADLT (ELECTROSURGICAL) ×2
ELECTRODE REM PT RTRN 9FT ADLT (ELECTROSURGICAL) ×1 IMPLANT
GLOVE BIO SURGEON STRL SZ7.5 (GLOVE) ×1 IMPLANT
GLOVE BIOGEL PI IND STRL 7.5 (GLOVE) IMPLANT
GLOVE BIOGEL PI IND STRL 8 (GLOVE) ×1 IMPLANT
GLOVE BIOGEL PI INDICATOR 7.5 (GLOVE) ×2
GLOVE BIOGEL PI INDICATOR 8 (GLOVE) ×1
GLOVE ECLIPSE 8.0 STRL XLNG CF (GLOVE) ×2 IMPLANT
GOWN STRL REUS W/ TWL LRG LVL3 (GOWN DISPOSABLE) ×2 IMPLANT
GOWN STRL REUS W/TWL LRG LVL3 (GOWN DISPOSABLE) ×6
KIT BASIN OR (CUSTOM PROCEDURE TRAY) ×2 IMPLANT
KIT ROOM TURNOVER OR (KITS) ×2 IMPLANT
NDL HYPO 25GX1X1/2 BEV (NEEDLE) ×1 IMPLANT
NEEDLE HYPO 25GX1X1/2 BEV (NEEDLE) ×2 IMPLANT
NS IRRIG 1000ML POUR BTL (IV SOLUTION) ×2 IMPLANT
PACK SURGICAL SETUP 50X90 (CUSTOM PROCEDURE TRAY) ×2 IMPLANT
PAD ARMBOARD 7.5X6 YLW CONV (MISCELLANEOUS) ×2 IMPLANT
PENCIL BUTTON HOLSTER BLD 10FT (ELECTRODE) ×2 IMPLANT
SPONGE GAUZE 4X4 12PLY (GAUZE/BANDAGES/DRESSINGS) ×2 IMPLANT
SPONGE LAP 4X18 X RAY DECT (DISPOSABLE) ×2 IMPLANT
STRIP CLOSURE SKIN 1/2X4 (GAUZE/BANDAGES/DRESSINGS) ×2 IMPLANT
SUT MNCRL AB 4-0 PS2 18 (SUTURE) ×2 IMPLANT
SUT VIC AB 3-0 SH 18 (SUTURE) ×2 IMPLANT
SYR CONTROL 10ML LL (SYRINGE) ×2 IMPLANT
TOWEL OR 17X24 6PK STRL BLUE (TOWEL DISPOSABLE) ×2 IMPLANT
TOWEL OR 17X26 10 PK STRL BLUE (TOWEL DISPOSABLE) ×2 IMPLANT
TOWEL OR NON WOVEN STRL DISP B (DISPOSABLE) ×1 IMPLANT
TUBE CONNECTING 12X1/4 (SUCTIONS) ×2 IMPLANT
YANKAUER SUCT BULB TIP NO VENT (SUCTIONS) ×2 IMPLANT

## 2013-08-23 NOTE — H&P (Signed)
[name] is an 43 y.o. female.   Chief Complaint:   Here for elective surgery HPI: She has suspicious microcalcifications in her right breast, medial aspect.  She is unable to have an imagine guided biopsy and so presents for right breast biopsy after wire localization.  Past Medical History  Diagnosis Date  . Hypertension   . H/O: Bell's palsy 2011  . Depression   . Fibromyalgia   . DJD (degenerative joint disease)   . Right foot drop   . Hypoventilation associated with obesity syndrome   . Hypothyroidism   . Back pain   . GERD (gastroesophageal reflux disease)   . Peripheral vascular disease   . Heart murmur   . Shortness of breath     with Activity  . Chronic pain syndrome   . Non-healing non-surgical wound     Right hip, has Wound vac to hip.  Started as a skin tear.  . Chronic heel ulcer   . Blood transfusion   . Migraine   . History of MRSA infection OF ULCER  . Dysrhythmia   . Sleep apnea     "study shows not bad enough for CPCP."  . Anxiety   . PONV (postoperative nausea and vomiting)     can be slow to wake up after surgery  . Diabetes mellitus     fasting 170-180  . Chronic kidney disease     Past Surgical History  Procedure Laterality Date  . Right elbow    . Back surgery      for lumbar disc disease X2  . 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 Cholecystectomy  1984  . I&d extremity  04/02/2011    Procedure: IRRIGATION AND DEBRIDEMENT EXTREMITY;  Surgeon: Mcarthur Rossetti;  Location: [place];  Service: Orthopedics;  Laterality: Right;  I&D right heel ulcer, placement of A-cell graft  . Brain surgery       Plating due to soft spot closing too early- age 50  . Lithotripsy      2007ish  . Incision and drainage of wound  08/29/2011    Procedure: IRRIGATION AND DEBRIDEMENT WOUND;  Surgeon: Theodoro Kos, DO;  Location: [place];  Service: Plastics;  Laterality: Right;    Family History  Problem Relation Age of Onset  . Diabetes  type II Father   . Diabetes type II Mother   . Anesthesia problems Mother    Social History:  reports that she has never smoked. She has never used smokeless tobacco. She reports that she does not drink alcohol or use illicit drugs.  Allergies:  Allergies  Allergen Reactions  . Augmentin [Amoxicillin-Pot Clavulanate] Hives  . Sulfa Antibiotics Hives    Medications Prior to Admission  Medication Sig Dispense Refill  . ARIPiprazole (ABILIFY) 5 MG tablet Take 5 mg by mouth daily.      Marland Kitchen atorvastatin (LIPITOR) 20 MG tablet Take 20 mg by mouth daily.      . benazepril (LOTENSIN) 10 MG tablet Take 10 mg by mouth daily.      . Biotin 5000 MCG CAPS Take 10,000 mcg by mouth 2 (two) times daily.       . carisoprodol (SOMA) 350 MG tablet Take 350 mg by mouth 4 (four) times daily as needed. For back spasms.      Marland Kitchen desvenlafaxine (PRISTIQ) 100 MG 24 hr tablet Take 100 mg by mouth daily.      . diazepam (VALIUM)  5 MG tablet Take 10 mg by mouth 4 (four) times daily as needed. Anxiety/sleep       . esomeprazole (NEXIUM) 40 MG capsule Take 40 mg by mouth daily before breakfast.       . fentaNYL (DURAGESIC - DOSED MCG/HR) 100 MCG/HR Place 200 mcg onto the skin every 3 (three) days.       Marland Kitchen gabapentin (NEURONTIN) 300 MG capsule Take 600 mg by mouth 3 (three) times daily.       . insulin glargine (LANTUS) 100 UNIT/ML injection Inject 45 Units into the skin 2 (two) times daily.       Marland Kitchen levothyroxine (SYNTHROID, LEVOTHROID) 125 MCG tablet Take 125 mcg by mouth daily.      . Liraglutide (VICTOZA) 18 MG/3ML SOLN Inject 1.8 mg into the skin every morning.       . nystatin (MYCOSTATIN/NYSTOP) 100000 UNIT/GM POWD Apply 1 g topically 2 (two) times daily.      Marland Kitchen oxyCODONE-acetaminophen (PERCOCET) 10-325 MG per tablet Take 2 tablets by mouth every 4 (four) hours as needed for pain.       . Prenatal Vit-Fe Fumarate-FA (PRENATAL MULTIVITAMIN) TABS tablet Take 1 tablet by mouth daily at 12 noon.      .  sitaGLIPtan-metformin (JANUMET) 50-1000 MG per tablet Take 1 tablet by mouth 2 (two) times daily with a meal.       . Cyanocobalamin (VITAMIN B-12 IJ) Inject 1 each as directed every 30 (thirty) days.       . furosemide (LASIX) 20 MG tablet Take 20 mg by mouth as needed.      . promethazine (PHENERGAN) 25 MG tablet Take 25 mg by mouth every 6 (six) hours as needed for nausea or vomiting.        Results for orders placed during the hospital encounter of 08/23/13 (from the past 48 hour(s))  GLUCOSE, CAPILLARY     Status: Abnormal   Collection Time    08/23/13 12:03 PM      Result Value Ref Range   Glucose-Capillary 214 (*) 70 - 99 mg/dL   Mm Rt Plc Breast Loc Dev   1st Lesion  Inc Mammo Guide  08/23/2013   CLINICAL DATA:  1.5 cm group of indeterminate microcalcifications in the medial right breast at recent mammography elsewhere. The patient exceeds the weight limit for a stereotactic biopsy.  EXAM: NEEDLE LOCALIZATION OF THE RIGHT BREAST WITH MAMMO GUIDANCE  COMPARISON:  Previous exams at [facility] in [place].  FINDINGS: Patient presents for needle localization prior to right breast excisional biopsy. I met with the patient and we discussed the procedure of needle localization including benefits and alternatives. We discussed the high likelihood of a successful procedure. We discussed the risks of the procedure, including infection, bleeding, tissue injury, and further surgery. Informed, written consent was given. The usual time-out protocol was performed immediately prior to the procedure.  Using mammographic guidance, sterile technique, 2% lidocaine and a 15 cm modified Kopans needle, the recently demonstrated group of microcalcifications in the medial right breast were localized using a medial approach. The films were marked for Dr. Zella Richer.  IMPRESSION: Needle localization right breast breast. No apparent complications.   Electronically Signed   By: Enrique Sack M.D.   On: 08/23/2013  11:25    Review of Systems  Constitutional: Negative for fever and chills.    Blood pressure 106/66, pulse 95, temperature 98.7 F (37.1 C), temperature source Oral, resp. rate 20, height 5' 5.5" (1.664 m),  weight 320 lb (145.151 kg), SpO2 96.00%. Physical Exam  Constitutional: No distress.  Morbidly obese.  HENT:  Head: Normocephalic and atraumatic.  Eyes: No scleral icterus.  Respiratory:  Dressing on right breast.  Neurological: She is alert.  Skin: Skin is warm and dry.  Psychiatric: She has a normal mood and affect. Her behavior is normal.     Assessment/Plan Abnormal microcalcifications right breast.  Plan: Right breast lumpectomy after wire localization.  Rhunette Croft Zendayah Hardgrave 08/23/2013, 12:48 PM

## 2013-08-23 NOTE — Interval H&P Note (Signed)
History and Physical Interval Note:  08/23/2013 12:47 PM  [name]  has presented today for surgery, with the diagnosis of breast microcalcifications  The various methods of treatment have been discussed with the patient and family. After consideration of risks, benefits and other options for treatment, the patient has consented to  Procedure(s): RIGHT BREAST LUMPECTOMY WITH NEEDLE LOCALIZATION (Right) as a surgical intervention .  The patient's history has been reviewed, patient examined, no change in status, stable for surgery.  I have reviewed the patient's chart and labs.  Questions were answered to the patient's satisfaction.     Rhunette Croft Maksymilian Mabey

## 2013-08-23 NOTE — Discharge Instructions (Signed)
[place] Office Phone Number [phone]  BREAST BIOPSY/ PARTIAL MASTECTOMY: POST OP INSTRUCTIONS  Always review your discharge instruction sheet given to you by the facility where your surgery was performed.  IF YOU HAVE DISABILITY OR FAMILY LEAVE FORMS, YOU MUST BRING THEM TO THE OFFICE FOR PROCESSING.  DO NOT GIVE THEM TO YOUR DOCTOR.  1. A prescription for pain medication may be given to you upon discharge.  Take your pain medication as prescribed, if needed.  If narcotic pain medicine is not needed, then you may take acetaminophen (Tylenol) or ibuprofen (Advil) as needed. 2. Take your usually prescribed medications unless otherwise directed 3. If you need a refill on your pain medication, please contact your pharmacy.  They will contact our office to request authorization.  Prescriptions will not be filled after 5pm or on week-ends. 4. You should eat very light the first 24 hours after surgery, such as soup, crackers, pudding, etc.  Resume your normal diet the day after surgery. 5. Most patients will experience some swelling and bruising in the breast.  Ice packs and a good support bra will help.  Swelling and bruising can take several days to resolve.  6. It is common to experience some constipation if taking pain medication after surgery.  Increasing fluid intake and taking a stool softener will usually help or prevent this problem from occurring.  A mild laxative (Milk of Magnesia or Miralax) should be taken according to package directions if there are no bowel movements after 48 hours. 7. Unless discharge instructions indicate otherwise, you may remove your bandages 48 hours after surgery, and you may shower at that time.  You may have steri-strips (small skin tapes) in place directly over the incision.  These strips should be left on the skin.  If your surgeon used skin glue on the incision, you may shower in 24 hours.  The glue will flake off over the next 2-3 weeks.   Any sutures or staples will be removed at the office during your follow-up visit. 8. ACTIVITIES:  You may resume regular daily activities (gradually increasing) beginning the next day.  Wearing a good support bra or sports bra minimizes pain and swelling.  You may have sexual intercourse when it is comfortable. a. You may drive when you no longer are taking prescription pain medication, you can comfortably wear a seatbelt, and you can safely maneuver your car and apply brakes. b. RETURN TO WORK:  3-4 days when comfortable.______________________________________________________________________________________ 9. You should see your doctor in the office for a follow-up appointment approximately 2-3 weeks after your surgery.  Call [phone] to make appointment.  Expect your pathology report 3 business days after your surgery.  You may call to check if you do not hear from [name] after three days. 10. OTHER INSTRUCTIONS: _______________________________________________________________________________________________ _____________________________________________________________________________________________________________________________________ _____________________________________________________________________________________________________________________________________ _____________________________________________________________________________________________________________________________________  WHEN TO CALL YOUR DOCTOR: 1. Fever over 101.0 2. Nausea and/or vomiting. 3. Extreme swelling or bruising. 4. Continued bleeding from incision. 5. Increased pain, redness, or drainage from the incision.  The clinic staff is available to answer your questions during regular business hours.  Please dont hesitate to call and ask to speak to one of the nurses for clinical concerns.  If you have a medical emergency, go to the nearest emergency room or call 911.  A surgeon from [facility] is  always on call at the hospital.  For further questions, please visit [email or web address]

## 2013-08-23 NOTE — Anesthesia Procedure Notes (Signed)
Procedure Name: Intubation Date/Time: 08/23/2013 1:16 PM Performed by: Susa Loffler Pre-anesthesia Checklist: Patient identified, Timeout performed, Emergency Drugs available, Suction available and Patient being monitored Patient Re-evaluated:Patient Re-evaluated prior to inductionOxygen Delivery Method: Circle system utilized Preoxygenation: Pre-oxygenation with 100% oxygen Intubation Type: IV induction Laryngoscope Size: Mac and 3 Grade View: Grade I Tube type: Oral Tube size: 7.5 mm Number of attempts: 1 Airway Equipment and Method: Stylet Placement Confirmation: ETT inserted through vocal cords under direct vision,  positive ETCO2 and breath sounds checked- equal and bilateral Secured at: 21 cm Tube secured with: Tape Dental Injury: Teeth and Oropharynx as per pre-operative assessment

## 2013-08-23 NOTE — H&P (View-Only) (Signed)
Patient ID: [name], female   DOB: 02/24/1971, 42 y.o.   MRN: [id number] I have reviewed the films. The radiologist at the breast center did not think they could find this by way of ultrasound. Thus I recommended we proceed with open wire-guided right breast lumpectomy and she is in agreement. We will work on getting a scheduled. 

## 2013-08-23 NOTE — Transfer of Care (Signed)
Immediate Anesthesia Transfer of Care Note  Patient: [name]  Procedure(s) Performed: Procedure(s): RIGHT BREAST LUMPECTOMY WITH NEEDLE LOCALIZATION (Right)  Patient Location: PACU  Anesthesia Type:General  Level of Consciousness: awake, alert  and oriented  Airway & Oxygen Therapy: Patient Spontanous Breathing and Patient connected to face mask oxygen  Post-op Assessment: Report given to PACU RN and Post -op Vital signs reviewed and stable  Post vital signs: Reviewed and stable  Complications: No apparent anesthesia complications

## 2013-08-23 NOTE — Op Note (Signed)
Operative Note  [name] female 44 y.o. 08/23/2013  PREOPERATIVE DX:  Abnormal microcalcifications right breast  POSTOPERATIVE DX:  Same  PROCEDURE:  Right breast lumpectomy after wire localization         Surgeon: Odis Hollingshead   Assistants: None  Anesthesia: General endotracheal anesthesia  Indications: This is a 43 year old female who is noted to have some abnormal microcalcifications in the medial aspect of the right breast. Image guided biopsy could not be performed. She now presents for the above procedure.    Procedure Detail:  She underwent successful wire localization. She was seen in the holding area and the right breast was marked with my initial. She was brought to the operating room placed supine on the operating table and general anesthetic was given. The bandage on the right breast was removed and the wire was cut close to the skin. The right breast and wire were sterilely prepped and draped.  A transverse radial incision was made beginning at the wire and extending medially. I dissected some of the subcutaneous tissue with electrocautery. The lesion was 6 cm deep to the skin. I then performed a generous lumpectomy around the tip of the wire using electrocautery. Once the specimen was removed I marked it with a silk suture for orientation. A specimen mammogram was performed in the area of concern was contained within the specimen. This was verified by the radiologist. The specimen was sent to pathology.  Local anesthetic consisting of half percent Marcaine was injected into the wound. The wound was irrigated and bleeding points were controlled with electrocautery and sutures. Once hemostasis was adequate the subcutaneous tissues approximated with interrupted 3-0 Vicryl sutures. The skin was closed with a running 4-0 Monocryl subcuticular stitch. Steri-Strips and a sterile dressing were applied.  She tolerated the procedure well without any apparent  complications and was taken to the recovery room in satisfactory condition.  Estimated Blood Loss:  150 ml         Drains: none  Blood Given: none          Specimens: Right breast tissue        Complications:  * No complications entered in OR log *         Disposition: PACU - hemodynamically stable.         Condition: stable

## 2013-08-23 NOTE — Anesthesia Preprocedure Evaluation (Addendum)
Anesthesia Evaluation  Patient identified by MRN, date of birth, ID band Patient awake    Reviewed: Allergy & Precautions, H&P , NPO status , Patient's Chart, lab work & pertinent test results  History of Anesthesia Complications (+) PONV and history of anesthetic complications  Airway Mallampati: II TM Distance: >3 FB Neck ROM: Full    Dental  (+) Teeth Intact   Pulmonary shortness of breath, sleep apnea ,  breath sounds clear to auscultation        Cardiovascular hypertension, Pt. on medications + Peripheral Vascular Disease Rhythm:Regular     Neuro/Psych  Headaches, PSYCHIATRIC DISORDERS Anxiety Depression Right foot drop  Neuromuscular disease    GI/Hepatic GERD-  Medicated and Controlled,  Endo/Other  diabetes, Type 2, Insulin DependentHypothyroidism Morbid obesity  Renal/GU      Musculoskeletal  (+) Fibromyalgia -, narcotic dependent  Abdominal   Peds  Hematology   Anesthesia Other Findings   Reproductive/Obstetrics                          Anesthesia Physical Anesthesia Plan  ASA: III  Anesthesia Plan: General   Post-op Pain Management:    Induction: Intravenous  Airway Management Planned: Oral ETT  Additional Equipment: None  Intra-op Plan:   Post-operative Plan: Extubation in OR  Informed Consent: I have reviewed the patients History and Physical, chart, labs and discussed the procedure including the risks, benefits and alternatives for the proposed anesthesia with the patient or authorized representative who has indicated his/her understanding and acceptance.   Dental advisory given  Plan Discussed with: CRNA and Surgeon  Anesthesia Plan Comments:         Anesthesia Quick Evaluation

## 2013-08-24 ENCOUNTER — Encounter (HOSPITAL_COMMUNITY): Payer: Self-pay | Admitting: General Surgery

## 2013-08-24 NOTE — Anesthesia Postprocedure Evaluation (Signed)
  Anesthesia Post-op Note  Patient: [name]  Procedure(s) Performed: Procedure(s): RIGHT BREAST LUMPECTOMY WITH NEEDLE LOCALIZATION (Right)  Patient Location: PACU  Anesthesia Type:General  Level of Consciousness: awake and alert   Airway and Oxygen Therapy: Patient Spontanous Breathing  Post-op Pain: mild  Post-op Assessment: Post-op Vital signs reviewed, Patient's Cardiovascular Status Stable, Respiratory Function Stable, Patent Airway, No signs of Nausea or vomiting and Pain level controlled  Post-op Vital Signs: Reviewed and stable  Last Vitals:  Filed Vitals:   08/23/13 1601  BP: 134/85  Pulse: 100  Temp:   Resp:     Complications: No apparent anesthesia complications

## 2013-08-26 ENCOUNTER — Telehealth (INDEPENDENT_AMBULATORY_CARE_PROVIDER_SITE_OTHER): Payer: Self-pay

## 2013-08-26 NOTE — Telephone Encounter (Signed)
Pt returning call to [facility]. Pt request call back at [phone].

## 2013-08-26 NOTE — Telephone Encounter (Signed)
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  Follow up appt with Dr. Zella Richer was made.

## 2013-09-01 ENCOUNTER — Encounter (INDEPENDENT_AMBULATORY_CARE_PROVIDER_SITE_OTHER): Payer: Self-pay

## 2013-09-13 ENCOUNTER — Other Ambulatory Visit (INDEPENDENT_AMBULATORY_CARE_PROVIDER_SITE_OTHER): Payer: Self-pay | Admitting: General Surgery

## 2013-09-13 ENCOUNTER — Ambulatory Visit (INDEPENDENT_AMBULATORY_CARE_PROVIDER_SITE_OTHER): Payer: Medicare Other | Admitting: General Surgery

## 2013-09-13 VITALS — BP 132/80 | HR 80 | Temp 98.0°F | Resp 18 | Ht 60.0 in | Wt 339.0 lb

## 2013-09-13 DIAGNOSIS — K429 Umbilical hernia without obstruction or gangrene: Secondary | ICD-10-CM

## 2013-09-13 DIAGNOSIS — R1909 Other intra-abdominal and pelvic swelling, mass and lump: Secondary | ICD-10-CM

## 2013-09-13 DIAGNOSIS — Z4889 Encounter for other specified surgical aftercare: Secondary | ICD-10-CM

## 2013-09-13 NOTE — Progress Notes (Signed)
Procedure:  Right breast lumpectomy after wire localization  Date:  08/23/2048  Pathology:  Fibroadenoma  History:  She is here for her first postoperative visit and is doing well. She tells me she also has an intermittently painful large umbilical bulge. She is concerned it might be a hernia. She would like me to take a look at that.  Exam: General- Is in NAD. Right breast-medial incision is clean and intact with no evidence of infection.  Abdomen-obese, right upper quadrant scar, large umbilical bulge that is not completely reducible. Diastases recti is present.  Assessment:  1. Pathology is benign his wound is healing well in the right breast. 2. Painful umbilical bulge which likely is consistent with a moderate to large size umbilical hernia.  Plan:  Check CT scan of abdomen and pelvis to define hernia and contents in it. We did discuss laparoscopic repair briefly. I will speak with her after the CT results regarding further recommendations.

## 2013-09-13 NOTE — Patient Instructions (Signed)
May use Mederma cream (Over the counter) on the breast scar as we discussed.  We will get a CT scan to look at the hernia.

## 2013-09-14 ENCOUNTER — Telehealth (INDEPENDENT_AMBULATORY_CARE_PROVIDER_SITE_OTHER): Payer: Self-pay | Admitting: *Deleted

## 2013-09-14 NOTE — Telephone Encounter (Signed)
LM for pt to return my call regarding her CT.  Please advise pt it is scheduled for 09-16-13 @ [facility], [place], Ph) [phone]. Let pt know she is to drink her 1st bottle of contrast at 1:20 and 2nd bottle at 2:20.  Also advise pt NO SOLID FOODS 4 hours prior to the test.  Thanks!  Anderson Malta

## 2013-09-16 ENCOUNTER — Other Ambulatory Visit: Payer: Medicare Other

## 2013-09-28 ENCOUNTER — Inpatient Hospital Stay: Admission: RE | Admit: 2013-09-28 | Payer: Medicare Other | Source: Ambulatory Visit

## 2014-01-07 ENCOUNTER — Other Ambulatory Visit: Payer: Self-pay

## 2014-09-19 ENCOUNTER — Other Ambulatory Visit: Payer: Self-pay

## 2015-01-16 ENCOUNTER — Other Ambulatory Visit: Payer: Self-pay

## 2015-01-16 DIAGNOSIS — Z1231 Encounter for screening mammogram for malignant neoplasm of breast: Secondary | ICD-10-CM

## 2015-02-24 ENCOUNTER — Ambulatory Visit
Admission: RE | Admit: 2015-02-24 | Discharge: 2015-02-24 | Disposition: A | Discharge status: Home / Self Care | Payer: Medicare Other | Source: Ambulatory Visit

## 2015-02-24 DIAGNOSIS — Z1231 Encounter for screening mammogram for malignant neoplasm of breast: Secondary | ICD-10-CM

## 2015-08-04 ENCOUNTER — Other Ambulatory Visit: Payer: Self-pay | Admitting: Adult Health Nurse Practitioner

## 2015-08-04 DIAGNOSIS — N644 Mastodynia: Secondary | ICD-10-CM

## 2015-08-28 ENCOUNTER — Ambulatory Visit
Admission: RE | Admit: 2015-08-28 | Discharge: 2015-08-28 | Disposition: A | Discharge status: Home / Self Care | Payer: Commercial Managed Care - HMO | Source: Ambulatory Visit | Attending: Adult Health Nurse Practitioner | Admitting: Adult Health Nurse Practitioner

## 2015-08-28 DIAGNOSIS — N644 Mastodynia: Secondary | ICD-10-CM

## 2016-02-07 ENCOUNTER — Ambulatory Visit (INDEPENDENT_AMBULATORY_CARE_PROVIDER_SITE_OTHER): Payer: Medicare HMO | Admitting: Orthopaedic Surgery

## 2016-02-07 DIAGNOSIS — M25561 Pain in right knee: Secondary | ICD-10-CM | POA: Diagnosis not present

## 2016-02-07 DIAGNOSIS — M1711 Unilateral primary osteoarthritis, right knee: Secondary | ICD-10-CM | POA: Diagnosis not present

## 2016-02-07 DIAGNOSIS — M1712 Unilateral primary osteoarthritis, left knee: Secondary | ICD-10-CM | POA: Diagnosis not present

## 2016-02-07 DIAGNOSIS — G8929 Other chronic pain: Secondary | ICD-10-CM

## 2016-02-07 DIAGNOSIS — M25562 Pain in left knee: Secondary | ICD-10-CM | POA: Diagnosis not present

## 2016-02-07 MED ORDER — LIDOCAINE HCL 1 % IJ SOLN
3.0000 mL | INTRAMUSCULAR | Status: AC | PRN
  Administered 2016-02-07: 3 mL

## 2016-02-07 MED ORDER — METHYLPREDNISOLONE ACETATE 40 MG/ML IJ SUSP
40.0000 mg | INTRAMUSCULAR | Status: AC | PRN
  Administered 2016-02-07: 40 mg via INTRA_ARTICULAR

## 2016-02-07 NOTE — Progress Notes (Signed)
Office Visit Note   Patient: [name]           Date of Birth: [birth year]           MRN: [id number] Visit Date: 02/07/2016              Requested by: Raeanne Gathers, MD [address] [place], [place] [address] PCP: Fransisca Connors, PA-C   Assessment & Plan: Visit Diagnoses:  1. Chronic pain of left knee   2. Chronic pain of right knee   3. Unilateral primary osteoarthritis, left knee   4. Unilateral primary osteoarthritis, right knee     Plan: She knows to wait at least 3 months between intra-articular steroid injections. She is down to 213 pounds elected down 270 pounds. She used to weigh well over 400 pounds. She no she has bone-on-bone wear arthritis and we recommended knee replacement surgery for her for when she is ready.  Follow-Up Instructions: Return if symptoms worsen or fail to improve.   Orders:  No orders of the defined types were placed in this encounter.  No orders of the defined types were placed in this encounter.     Procedures: Large Joint Inj Date/Time: 02/07/2016 2:15 PM Performed by: Mcarthur Rossetti Authorized by: Jean Rosenthal Y   Location:  Knee Site:  R knee Approach:  Anterolateral Ultrasound Guidance: No   Fluoroscopic Guidance: No   Arthrogram: No   Medications:  3 mL lidocaine 1 %; 40 mg methylPREDNISolone acetate 40 MG/ML Large Joint Inj Date/Time: 02/07/2016 2:15 PM Performed by: Mcarthur Rossetti Authorized by: Mcarthur Rossetti   Location:  Knee Site:  L knee Approach:  Anterolateral Ultrasound Guidance: No   Fluoroscopic Guidance: No   Arthrogram: No   Medications:  3 mL lidocaine 1 %     Clinical Data: No additional findings.   Subjective: Chief Complaint  Patient presents with  . Left Knee - Pain, Follow-up    Patient had bilateral knee injections 11/22/15, she wouls like more today  . Right Knee - Pain, Follow-up   She comes in today for injections in her knees. She has known  severe tricompartmental arthritis of both knees. He continues to lose significant weight HPI  Review of Systems   Objective: Vital Signs: There were no vitals taken for this visit.  Physical Exam She is alert and oriented 3 Ortho Exam Both knees have patellofemoral crepitation and painful range of motion with medial and lateral joint line tenderness. Both knees are ligamentously stable Specialty Comments:  No specialty comments available.  Imaging: No results found.   PMFS History: Patient Active Problem List   Diagnosis Date Noted  . Umbilical mass 0000000  . Abnormal mammogram with microcalcification-Right inner lower quadrant 07/21/2013  . Acute renal failure (ARF) ([place]) 09/02/2012  . Oliguria and anuria 09/02/2012  . Chronic heel ulcer ([facility]) 04/02/2011  . URI (upper respiratory infection) 03/05/2011  . Constipation 03/02/2011  . Lethargy 03/01/2011  . Diabetic foot ulcer ([facility]) 02/28/2011  . Heel ulcer due to DM ([facility]) 02/19/2011  . Wound, open, hip or thigh 02/19/2011  . Healing pressure ulcer stage III ([facility]) 02/18/2011  . Morbidly obese ([place]) 02/18/2011  . Diabetes mellitus type 2 with complications, uncontrolled ([facility]) 02/18/2011  . Sleep apnea 02/18/2011  . Hypothyroidism 02/18/2011  . Chronic pain 02/18/2011  . Acute lymphangitis 02/18/2011  . Cellulitis of foot 02/18/2011   Past Medical History:  Diagnosis Date  . Anxiety   . Back  pain   . Blood transfusion   . Chronic heel ulcer   . Chronic kidney disease   . Chronic pain syndrome   . Depression   . Diabetes mellitus    fasting 170-180  . DJD (degenerative joint disease)   . Dysrhythmia   . Fibromyalgia   . GERD (gastroesophageal reflux disease)   . H/O: Bell's palsy 2011  . Heart murmur   . History of MRSA infection OF ULCER  . Hypertension   . Hypothyroidism   . Hypoventilation associated with obesity syndrome   . Migraine   . Non-healing non-surgical wound    Right hip, has Wound vac to  hip.  Started as a skin tear.  . Peripheral vascular disease   . PONV (postoperative nausea and vomiting)    can be slow to wake up after surgery  . Right foot drop   . Shortness of breath    with Activity  . Sleep apnea    "study shows not bad enough for CPCP."    Family History  Problem Relation Age of Onset  . Diabetes type II Father   . Diabetes type II Mother   . Anesthesia problems Mother     Past Surgical History:  Procedure Laterality Date  . BACK SURGERY     for lumbar disc disease X2  . 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 BRAIN SURGERY      Plating due to soft spot closing too early- age 56  . BREAST LUMPECTOMY WITH NEEDLE LOCALIZATION Right 08/23/2013   Procedure: RIGHT BREAST LUMPECTOMY WITH NEEDLE LOCALIZATION;  Surgeon: Odis Hollingshead, MD;  Location: [facility];  Service: General;  Laterality: Right;  . CHOLECYSTECTOMY  1984  . I&D EXTREMITY  04/02/2011   Procedure: IRRIGATION AND DEBRIDEMENT EXTREMITY;  Surgeon: Mcarthur Rossetti;  Location: [facility];  Service: Orthopedics;  Laterality: Right;  I&D right heel ulcer, placement of A-cell graft  . INCISION AND DRAINAGE OF WOUND  08/29/2011   Procedure: IRRIGATION AND DEBRIDEMENT WOUND;  Surgeon: Theodoro Kos, DO;  Location: [facility];  Service: Plastics;  Laterality: Right;  . LITHOTRIPSY     2007ish  . right elbow     Social History   Occupational History  . Not on file.   Social History Main Topics  . Smoking status: Never Smoker  . Smokeless tobacco: Never Used  . Alcohol use No  . Drug use: No  . Sexual activity: No

## 2016-04-24 ENCOUNTER — Ambulatory Visit (INDEPENDENT_AMBULATORY_CARE_PROVIDER_SITE_OTHER): Payer: Medicare HMO | Admitting: Orthopaedic Surgery

## 2016-04-24 DIAGNOSIS — M25562 Pain in left knee: Secondary | ICD-10-CM

## 2016-04-24 DIAGNOSIS — G8929 Other chronic pain: Secondary | ICD-10-CM

## 2016-04-24 DIAGNOSIS — M1712 Unilateral primary osteoarthritis, left knee: Secondary | ICD-10-CM

## 2016-04-24 DIAGNOSIS — M25561 Pain in right knee: Secondary | ICD-10-CM

## 2016-04-24 DIAGNOSIS — M1711 Unilateral primary osteoarthritis, right knee: Secondary | ICD-10-CM

## 2016-04-24 MED ORDER — LIDOCAINE HCL 1 % IJ SOLN
3.0000 mL | INTRAMUSCULAR | Status: AC | PRN
  Administered 2016-04-24: 3 mL

## 2016-04-24 MED ORDER — METHYLPREDNISOLONE ACETATE 40 MG/ML IJ SUSP
40.0000 mg | INTRAMUSCULAR | Status: AC | PRN
Start: 2016-04-24 — End: 2016-04-24
  Administered 2016-04-24: 40 mg via INTRA_ARTICULAR

## 2016-04-24 MED ORDER — METHYLPREDNISOLONE ACETATE 40 MG/ML IJ SUSP
40.0000 mg | INTRAMUSCULAR | Status: AC | PRN
  Administered 2016-04-24: 40 mg via INTRA_ARTICULAR

## 2016-04-24 NOTE — Progress Notes (Signed)
Office Visit Note   Patient: [name]           Date of Birth: 09/15/1970           MRN: [id number] Visit Date: 04/24/2016              Requested by: Fransisca Connors, PA-C [address] [place], [place] [address] PCP: Fransisca Connors, PA-C   Assessment & Plan: Visit Diagnoses:  1. Chronic pain of right knee   2. Unilateral primary osteoarthritis, left knee   3. Unilateral primary osteoarthritis, right knee   4. Chronic pain of left knee     Plan: She still wants to pursue injections for now since this works for her every 3 months. She will consider knee replacement surgery when she is at her ideal body weight she states.  Follow-Up Instructions: Return in about 3 months (around 07/22/2016).   Orders:  No orders of the defined types were placed in this encounter.  No orders of the defined types were placed in this encounter.     Procedures: Large Joint Inj Date/Time: 04/24/2016 2:21 PM Performed by: Mcarthur Rossetti Authorized by: Jean Rosenthal Y   Location:  Knee Site:  R knee Ultrasound Guidance: No   Fluoroscopic Guidance: No   Arthrogram: No   Medications:  3 mL lidocaine 1 %; 40 mg methylPREDNISolone acetate 40 MG/ML Large Joint Inj Date/Time: 04/24/2016 2:21 PM Performed by: Mcarthur Rossetti Authorized by: Mcarthur Rossetti   Location:  Knee Site:  L knee Ultrasound Guidance: No   Fluoroscopic Guidance: No   Arthrogram: No   Medications:  3 mL lidocaine 1 %; 40 mg methylPREDNISolone acetate 40 MG/ML     Clinical Data: No additional findings.   Subjective: No chief complaint on file. The patient is well-known to me. She has chronic bilateral knee pain due to severe osteophytes of both her knees. She is to be incredibly morbidly obese. She's lost well over 200 pounds and is now down to only 219 pounds. She still working on weight loss and does not want to proceed with any type of surgery until she is  at her ideal body weight she states. She did have a fall a few days ago landing on her right knee and is having bruising and pain with that knee but she has been able to walk.  HPI  Review of Systems She denies any change in her health status and no recent illnesses.  Objective: Vital Signs: There were no vitals taken for this visit.  Physical Exam She is alert and 3 and he can tell she is loss of abundant amount of weight. Ortho Exam Both knees have full range of motion. Both knees are ligamentously stable. Both knees have significant patellofemoral crepitation and medial joint line tenderness. There is bruising around her right knee. Specialty Comments:  No specialty comments available.  Imaging: No results found.   PMFS History: Patient Active Problem List   Diagnosis Date Noted  . Umbilical mass 0000000  . Abnormal mammogram with microcalcification-Right inner lower quadrant 07/21/2013  . Acute renal failure (ARF) ([facility]) 09/02/2012  . Oliguria and anuria 09/02/2012  . Chronic heel ulcer ([facility]) 04/02/2011  . URI (upper respiratory infection) 03/05/2011  . Constipation 03/02/2011  . Lethargy 03/01/2011  . Diabetic foot ulcer ([facility] [place]) 02/28/2011  . Heel ulcer due to DM ([facility]) 02/19/2011  . Wound, open, hip or thigh 02/19/2011  . Healing pressure ulcer stage III ([facility])  02/18/2011  . Morbidly obese ([facility]) 02/18/2011  . Diabetes mellitus type 2 with complications, uncontrolled ([facility]) 02/18/2011  . Sleep apnea 02/18/2011  . Hypothyroidism 02/18/2011  . Chronic pain 02/18/2011  . Acute lymphangitis 02/18/2011  . Cellulitis of foot 02/18/2011   Past Medical History:  Diagnosis Date  . Anxiety   . Back pain   . Blood transfusion   . Chronic heel ulcer   . Chronic kidney disease   . Chronic pain syndrome   . Depression   . Diabetes mellitus    fasting 170-180  . DJD (degenerative joint disease)   . Dysrhythmia   . Fibromyalgia   . GERD (gastroesophageal reflux disease)     . H/O: Bell's palsy 2011  . Heart murmur   . History of MRSA infection OF ULCER  . Hypertension   . Hypothyroidism   . Hypoventilation associated with obesity syndrome   . Migraine   . Non-healing non-surgical wound    Right hip, has Wound vac to hip.  Started as a skin tear.  . Peripheral vascular disease   . PONV (postoperative nausea and vomiting)    can be slow to wake up after surgery  . Right foot drop   . Shortness of breath    with Activity  . Sleep apnea    "study shows not bad enough for CPCP."    Family History  Problem Relation Age of Onset  . Diabetes type II Father   . Diabetes type II Mother   . Anesthesia problems Mother     Past Surgical History:  Procedure Laterality Date  . BACK SURGERY     for lumbar disc disease X2  . 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 BRAIN SURGERY      Plating due to soft spot closing too early- age 82  . BREAST LUMPECTOMY WITH NEEDLE LOCALIZATION Right 08/23/2013   Procedure: RIGHT BREAST LUMPECTOMY WITH NEEDLE LOCALIZATION;  Surgeon: Odis Hollingshead, MD;  Location: [facility];  Service: General;  Laterality: Right;  . CHOLECYSTECTOMY  1984  . I&D EXTREMITY  04/02/2011   Procedure: IRRIGATION AND DEBRIDEMENT EXTREMITY;  Surgeon: Mcarthur Rossetti;  Location: [facility];  Service: Orthopedics;  Laterality: Right;  I&D right heel ulcer, placement of A-cell graft  . INCISION AND DRAINAGE OF WOUND  08/29/2011   Procedure: IRRIGATION AND DEBRIDEMENT WOUND;  Surgeon: Theodoro Kos, DO;  Location: [facility];  Service: Plastics;  Laterality: Right;  . LITHOTRIPSY     2007ish  . right elbow     Social History   Occupational History  . Not on file.   Social History Main Topics  . Smoking status: Never Smoker  . Smokeless tobacco: Never Used  . Alcohol use No  . Drug use: No  . Sexual activity: No

## 2016-07-11 ENCOUNTER — Ambulatory Visit (HOSPITAL_COMMUNITY): Payer: Self-pay | Admitting: Psychiatry

## 2016-07-22 ENCOUNTER — Ambulatory Visit (INDEPENDENT_AMBULATORY_CARE_PROVIDER_SITE_OTHER): Payer: Medicare HMO

## 2016-07-22 ENCOUNTER — Ambulatory Visit (INDEPENDENT_AMBULATORY_CARE_PROVIDER_SITE_OTHER): Payer: Medicare HMO | Admitting: Orthopaedic Surgery

## 2016-07-22 DIAGNOSIS — G8929 Other chronic pain: Secondary | ICD-10-CM

## 2016-07-22 DIAGNOSIS — M25561 Pain in right knee: Secondary | ICD-10-CM

## 2016-07-22 DIAGNOSIS — M25562 Pain in left knee: Secondary | ICD-10-CM

## 2016-07-22 MED ORDER — LIDOCAINE HCL 1 % IJ SOLN
3.0000 mL | INTRAMUSCULAR | Status: AC | PRN
  Administered 2016-07-22: 3 mL

## 2016-07-22 MED ORDER — METHYLPREDNISOLONE ACETATE 40 MG/ML IJ SUSP
40.0000 mg | INTRAMUSCULAR | Status: AC | PRN
  Administered 2016-07-22: 40 mg via INTRA_ARTICULAR

## 2016-07-22 NOTE — Progress Notes (Signed)
Office Visit Note   Patient: [name]           Date of Birth: 24-May-1970           MRN: [id number] Visit Date: 07/22/2016              Requested by: Fransisca Connors, PA-C [address] [place], [place] [address] PCP: Fransisca Connors, PA-C   Assessment & Plan: Visit Diagnoses:  1. Chronic pain of right knee   2. Chronic pain of left knee     Plan: She tolerated the steroid injection in both knees well. She'll find out from her other physician what brace was recommended for her ankle. When she calls [name] with the name of that brace we can send a prescription to [employer] for that specific brace for her right ankle for chronic foot drop. It sounds like it may be an articulating brace and she like to try.  Follow-Up Instructions: Return in about 3 months (around 10/21/2016).   Orders:  Orders Placed This Encounter  Procedures  . Large Joint Injection/Arthrocentesis  . Large Joint Injection/Arthrocentesis  . XR Knee 1-2 Views Right   No orders of the defined types were placed in this encounter.     Procedures: Large Joint Inj Date/Time: 07/22/2016 3:30 PM Performed by: Mcarthur Rossetti Authorized by: Jean Rosenthal Y   Location:  Knee Site:  R knee Ultrasound Guidance: No   Fluoroscopic Guidance: No   Arthrogram: No   Medications:  3 mL lidocaine 1 %; 40 mg methylPREDNISolone acetate 40 MG/ML Large Joint Inj Date/Time: 07/22/2016 3:30 PM Performed by: Mcarthur Rossetti Authorized by: Mcarthur Rossetti   Location:  Knee Site:  L knee Ultrasound Guidance: No   Fluoroscopic Guidance: No   Arthrogram: No   Medications:  3 mL lidocaine 1 %; 40 mg methylPREDNISolone acetate 40 MG/ML     Clinical Data: No additional findings.   Subjective: No chief complaint on file. Patient is well-known to me. She continues to lose a significant amount of weight with activity modification and diet. She has severe end-stage arthritis  of both her knees. She still not ready for knee replacement surgery yet. She like to lose more weight. She is doing absolutely great with that. Her hemoglobin A1c is now below 5. She is off all blood pressure medications as well. Her pain is quite severe both knees and it is daily.  HPI  Review of Systems She denies a chest pain, short of breath, fever, chills, nausea, vomiting.  Objective: Vital Signs: There were no vitals taken for this visit.  Physical Exam She is alert or 3 in no acute distress Ortho Exam Both knees have significant pain with range of motion. Both knees have medial lateral joint line tenderness and patellofemoral crepitation. Specialty Comments:  No specialty comments available.  Imaging: Xr Knee 1-2 Views Right  Result Date: 07/22/2016 An AP of her right knee also shows a left knee and there is complete loss of joint space of both knees with end-stage arthritis.    PMFS History: Patient Active Problem List   Diagnosis Date Noted  . Chronic pain of right knee 07/22/2016  . Chronic pain of left knee 07/22/2016  . Umbilical mass 56/38/7564  . Abnormal mammogram with microcalcification-Right inner lower quadrant 07/21/2013  . Acute renal failure (ARF) ([facility]) 09/02/2012  . Oliguria and anuria 09/02/2012  . Chronic heel ulcer ([facility]) 04/02/2011  . URI (upper respiratory infection) 03/05/2011  .  Constipation 03/02/2011  . Lethargy 03/01/2011  . Diabetic foot ulcer ([facility]) 02/28/2011  . Heel ulcer due to DM ([facility]) 02/19/2011  . Wound, open, hip or thigh 02/19/2011  . Healing pressure ulcer stage III ([facility]) 02/18/2011  . Morbidly obese ([facility]) 02/18/2011  . Diabetes mellitus type 2 with complications, uncontrolled ([facility]) 02/18/2011  . Sleep apnea 02/18/2011  . Hypothyroidism 02/18/2011  . Chronic pain 02/18/2011  . Acute lymphangitis 02/18/2011  . Cellulitis of foot 02/18/2011   Past Medical History:  Diagnosis Date  . Anxiety   . Back pain   . Blood  transfusion   . Chronic heel ulcer   . Chronic kidney disease   . Chronic pain syndrome   . Depression   . Diabetes mellitus    fasting 170-180  . DJD (degenerative joint disease)   . Dysrhythmia   . Fibromyalgia   . GERD (gastroesophageal reflux disease)   . H/O: Bell's palsy 2011  . Heart murmur   . History of MRSA infection OF ULCER  . Hypertension   . Hypothyroidism   . Hypoventilation associated with obesity syndrome   . Migraine   . Non-healing non-surgical wound    Right hip, has Wound vac to hip.  Started as a skin tear.  . Peripheral vascular disease   . PONV (postoperative nausea and vomiting)    can be slow to wake up after surgery  . Right foot drop   . Shortness of breath    with Activity  . Sleep apnea    "study shows not bad enough for CPCP."    Family History  Problem Relation Age of Onset  . Diabetes type II Father   . Diabetes type II Mother   . Anesthesia problems Mother     Past Surgical History:  Procedure Laterality Date  . BACK SURGERY     for lumbar disc disease X2  . 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 BRAIN SURGERY      Plating due to soft spot closing too early- age 80  . BREAST LUMPECTOMY WITH NEEDLE LOCALIZATION Right 08/23/2013   Procedure: RIGHT BREAST LUMPECTOMY WITH NEEDLE LOCALIZATION;  Surgeon: Odis Hollingshead, MD;  Location: [facility];  Service: General;  Laterality: Right;  . CHOLECYSTECTOMY  1984  . I&D EXTREMITY  04/02/2011   Procedure: IRRIGATION AND DEBRIDEMENT EXTREMITY;  Surgeon: Mcarthur Rossetti;  Location: [facility];  Service: Orthopedics;  Laterality: Right;  I&D right heel ulcer, placement of A-cell graft  . INCISION AND DRAINAGE OF WOUND  08/29/2011   Procedure: IRRIGATION AND DEBRIDEMENT WOUND;  Surgeon: Theodoro Kos, DO;  Location: [facility];  Service: Plastics;  Laterality: Right;  . LITHOTRIPSY     2007ish  . right elbow     Social History   Occupational History  . Not on file.   Social History  Main Topics  . Smoking status: Never Smoker  . Smokeless tobacco: Never Used  . Alcohol use No  . Drug use: No  . Sexual activity: No

## 2016-08-12 ENCOUNTER — Ambulatory Visit (HOSPITAL_COMMUNITY): Payer: Self-pay | Admitting: Psychiatry

## 2016-08-26 ENCOUNTER — Telehealth (INDEPENDENT_AMBULATORY_CARE_PROVIDER_SITE_OTHER): Payer: Self-pay

## 2016-08-26 NOTE — Telephone Encounter (Signed)
Patient would like a CB concerning bilateral knee.  Cb# is [phone].

## 2016-08-27 ENCOUNTER — Telehealth (INDEPENDENT_AMBULATORY_CARE_PROVIDER_SITE_OTHER): Payer: Self-pay | Admitting: Orthopaedic Surgery

## 2016-08-27 NOTE — Telephone Encounter (Signed)
Unfortunately there is nothing else we can do.

## 2016-08-27 NOTE — Telephone Encounter (Signed)
Patient called back asked for a return call concerning her knees. Patient said her right knee is hurting really bad. The number to contact patient is [phone]

## 2016-08-27 NOTE — Telephone Encounter (Signed)
She called earlier and I was just now getting back to her, what else can we do for her (other than replacements). She just recently had injections

## 2016-08-28 NOTE — Telephone Encounter (Signed)
Patient will return my call

## 2016-09-27 DIAGNOSIS — K219 Gastro-esophageal reflux disease without esophagitis: Secondary | ICD-10-CM | POA: Insufficient documentation

## 2016-10-24 ENCOUNTER — Ambulatory Visit (INDEPENDENT_AMBULATORY_CARE_PROVIDER_SITE_OTHER): Payer: Medicare HMO | Admitting: Orthopaedic Surgery

## 2016-10-24 DIAGNOSIS — M1711 Unilateral primary osteoarthritis, right knee: Secondary | ICD-10-CM | POA: Diagnosis not present

## 2016-10-24 DIAGNOSIS — M25562 Pain in left knee: Secondary | ICD-10-CM

## 2016-10-24 DIAGNOSIS — M1712 Unilateral primary osteoarthritis, left knee: Secondary | ICD-10-CM | POA: Diagnosis not present

## 2016-10-24 DIAGNOSIS — M25561 Pain in right knee: Secondary | ICD-10-CM | POA: Diagnosis not present

## 2016-10-24 DIAGNOSIS — G8929 Other chronic pain: Secondary | ICD-10-CM | POA: Diagnosis not present

## 2016-10-24 MED ORDER — LIDOCAINE HCL 1 % IJ SOLN
3.0000 mL | INTRAMUSCULAR | Status: AC | PRN
  Administered 2016-10-24: 3 mL

## 2016-10-24 MED ORDER — METHYLPREDNISOLONE ACETATE 40 MG/ML IJ SUSP
40.0000 mg | INTRAMUSCULAR | Status: AC | PRN
  Administered 2016-10-24: 40 mg via INTRA_ARTICULAR

## 2016-10-24 NOTE — Progress Notes (Signed)
   Procedure Note  Patient: [name]             Date of Birth: 1970/08/23           MRN: [id number]             Visit Date: 10/24/2016  Procedures: Visit Diagnoses: Chronic pain of left knee  Chronic pain of right knee  Unilateral primary osteoarthritis, left knee  Unilateral primary osteoarthritis, right knee  Large Joint Inj Date/Time: 10/24/2016 5:11 PM Performed by: Mcarthur Rossetti Authorized by: Mcarthur Rossetti   Location:  Knee Site:  R knee Ultrasound Guidance: No   Fluoroscopic Guidance: No   Arthrogram: No   Medications:  3 mL lidocaine 1 %; 40 mg methylPREDNISolone acetate 40 MG/ML Large Joint Inj Date/Time: 10/24/2016 5:11 PM Performed by: Mcarthur Rossetti Authorized by: Mcarthur Rossetti   Location:  Knee Site:  L knee Ultrasound Guidance: No   Fluoroscopic Guidance: No   Arthrogram: No   Medications:  3 mL lidocaine 1 %; 40 mg methylPREDNISolone acetate 40 MG/ML   The patient is well-known to me. She has debilitating arthritis in both of her knees. She is someone who used to weigh well over 400 pounds and now she is loss at least more than 200 pounds. Her arthritis is severe in both her knees. She is getting close to the point where she was consider knee replacement surgery at least on the right side. She is here today for regular steroid injections that she gets in her knees every 3 months. She still lost more weight.  On exam she still has significant disease in both knees with good range of motion but significant crepitation varus malalignment.  At this point she tolerated injections again in both her knees. She was consider surgery sometime after the first the year. We'll see her back in 3 months for considering repeat steroid injections.

## 2017-01-22 ENCOUNTER — Ambulatory Visit (INDEPENDENT_AMBULATORY_CARE_PROVIDER_SITE_OTHER): Payer: Medicare HMO | Admitting: Orthopaedic Surgery

## 2017-01-22 DIAGNOSIS — M1711 Unilateral primary osteoarthritis, right knee: Secondary | ICD-10-CM

## 2017-01-22 DIAGNOSIS — M1712 Unilateral primary osteoarthritis, left knee: Secondary | ICD-10-CM

## 2017-01-22 MED ORDER — METHYLPREDNISOLONE ACETATE 40 MG/ML IJ SUSP
40.0000 mg | INTRAMUSCULAR | Status: AC | PRN
  Administered 2017-01-22: 40 mg via INTRA_ARTICULAR

## 2017-01-22 MED ORDER — LIDOCAINE HCL 1 % IJ SOLN
3.0000 mL | INTRAMUSCULAR | Status: AC | PRN
  Administered 2017-01-22: 3 mL

## 2017-01-22 NOTE — Progress Notes (Signed)
Office Visit Note   Patient: [name]           Date of Birth: 04/24/1970           MRN: [id number] Visit Date: 01/22/2017              Requested by: Fransisca Connors, PA-C [place], [place] [address] PCP: Fransisca Connors, PA-C   Assessment & Plan: Visit Diagnoses:  1. Unilateral primary osteoarthritis, left knee   2. Unilateral primary osteoarthritis, right knee     Plan: I would apply steroid injections in both knees today.  I will see her back in January of next year.  At that visit I would like an AP and lateral of both knees.  We will then please a steroid injection in her left knee and set her up for a total knee arthroplasty on the right knee.  Follow-Up Instructions: Return in about 3 months (around 04/24/2017).   Orders:  Orders Placed This Encounter  Procedures  . Large Joint Injection/Arthrocentesis  . Large Joint Injection/Arthrocentesis   No orders of the defined types were placed in this encounter.     Procedures: Large Joint Inj Date/Time: 01/22/2017 5:14 PM Performed by: Mcarthur Rossetti Authorized by: Jean Rosenthal Y   Location:  Knee Site:  R knee Ultrasound Guidance: No   Fluoroscopic Guidance: No   Arthrogram: No   Medications:  3 mL lidocaine 1 %; 40 mg methylPREDNISolone acetate 40 MG/ML Large Joint Inj Date/Time: 01/22/2017 5:14 PM Performed by: Mcarthur Rossetti Authorized by: Mcarthur Rossetti   Location:  Knee Site:  L knee Ultrasound Guidance: No   Fluoroscopic Guidance: No   Arthrogram: No   Medications:  3 mL lidocaine 1 %; 40 mg methylPREDNISolone acetate 40 MG/ML     Clinical Data: No additional findings.   Subjective: No chief complaint on file. The patient is well-known to me.  She used to be an incredibly morbidly obese female who is lost a tremendous amount of weight now.  She is now under 230 pounds.  She has debilitating arthritis in both her knees.   She comes in about every 3 months for steroid injections in these knees.  Her hemoglobin A1c now is in the 4 range.  She would like to have steroid injections today and finally schedule a total knee arthroplasty sometime after the first of next year.  She is doing well otherwise.  Her pain though is severe and she has well-documented osteoarthritis of both her knees have been following her for a prolonged period time for these.  HPI  Review of Systems She currently denies any headache, chest pain, shortness of breath, fever, chills, nausea, vomiting.  Objective: Vital Signs: There were no vitals taken for this visit.  Physical Exam She is alert and oriented x3 in no acute distress Ortho Exam Both knees show varus malalignment.  Both knees have severe pain with range of motion that is full.  Both knees have severe patellofemoral crepitation. Specialty Comments:  No specialty comments available.  Imaging: No results found.   PMFS History: Patient Active Problem List   Diagnosis Date Noted  . Unilateral primary osteoarthritis, left knee 10/24/2016  . Unilateral primary osteoarthritis, right knee 10/24/2016  . Chronic pain of right knee 07/22/2016  . Chronic pain of left knee 07/22/2016  . Umbilical mass 56/43/3295  . Abnormal mammogram with microcalcification-Right inner lower quadrant 07/21/2013  . Acute renal failure (ARF) (  [place]) 09/02/2012  . Oliguria and anuria 09/02/2012  . Chronic heel ulcer ([place]) 04/02/2011  . URI (upper respiratory infection) 03/05/2011  . Constipation 03/02/2011  . Lethargy 03/01/2011  . Diabetic foot ulcer ([place]) 02/28/2011  . Heel ulcer due to DM ([place]) 02/19/2011  . Wound, open, hip or thigh 02/19/2011  . Healing pressure ulcer stage III ([place]) 02/18/2011  . Morbidly obese ([place]) 02/18/2011  . Diabetes mellitus type 2 with complications, uncontrolled ([place]) 02/18/2011  . Sleep apnea 02/18/2011  . Hypothyroidism 02/18/2011  . Chronic pain 02/18/2011  .  Acute lymphangitis 02/18/2011  . Cellulitis of foot 02/18/2011   Past Medical History:  Diagnosis Date  . Anxiety   . Back pain   . Blood transfusion   . Chronic heel ulcer   . Chronic kidney disease   . Chronic pain syndrome   . Depression   . Diabetes mellitus    fasting 170-180  . DJD (degenerative joint disease)   . Dysrhythmia   . Fibromyalgia   . GERD (gastroesophageal reflux disease)   . H/O: Bell's palsy 2011  . Heart murmur   . History of MRSA infection OF ULCER  . Hypertension   . Hypothyroidism   . Hypoventilation associated with obesity syndrome   . Migraine   . Non-healing non-surgical wound    Right hip, has Wound vac to hip.  Started as a skin tear.  . Peripheral vascular disease   . PONV (postoperative nausea and vomiting)    can be slow to wake up after surgery  . Right foot drop   . Shortness of breath    with Activity  . Sleep apnea    "study shows not bad enough for CPCP."    Family History  Problem Relation Age of Onset  . Diabetes type II Father   . Diabetes type II Mother   . Anesthesia problems Mother     Past Surgical History:  Procedure Laterality Date  . BACK SURGERY     for lumbar disc disease X2  . 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 BRAIN SURGERY      Plating due to soft spot closing too early- age 31  . BREAST LUMPECTOMY WITH NEEDLE LOCALIZATION Right 08/23/2013   Procedure: RIGHT BREAST LUMPECTOMY WITH NEEDLE LOCALIZATION;  Surgeon: Odis Hollingshead, MD;  Location: [facility];  Service: General;  Laterality: Right;  . CHOLECYSTECTOMY  1984  . I&D EXTREMITY  04/02/2011   Procedure: IRRIGATION AND DEBRIDEMENT EXTREMITY;  Surgeon: Mcarthur Rossetti;  Location: [facility];  Service: Orthopedics;  Laterality: Right;  I&D right heel ulcer, placement of A-cell graft  . INCISION AND DRAINAGE OF WOUND  08/29/2011   Procedure: IRRIGATION AND DEBRIDEMENT WOUND;  Surgeon: Theodoro Kos, DO;  Location: [facility];  Service: Plastics;   Laterality: Right;  . LITHOTRIPSY     2007ish  . right elbow     Social History   Occupational History  . Not on file.   Social History Main Topics  . Smoking status: Never Smoker  . Smokeless tobacco: Never Used  . Alcohol use No  . Drug use: No  . Sexual activity: No

## 2017-01-27 ENCOUNTER — Ambulatory Visit (INDEPENDENT_AMBULATORY_CARE_PROVIDER_SITE_OTHER): Payer: Medicare HMO | Admitting: Orthopaedic Surgery

## 2017-03-11 ENCOUNTER — Telehealth (INDEPENDENT_AMBULATORY_CARE_PROVIDER_SITE_OTHER): Payer: Self-pay | Admitting: Orthopaedic Surgery

## 2017-03-11 NOTE — Telephone Encounter (Signed)
Patient asking if her friend can see [name]

## 2017-03-11 NOTE — Telephone Encounter (Signed)
Patient returned call asked for a call back. The number to contact patient is [phone]

## 2017-03-11 NOTE — Telephone Encounter (Signed)
Patient would like a call back, she did not specify what she wanted. CB # [phone]

## 2017-04-23 ENCOUNTER — Ambulatory Visit (INDEPENDENT_AMBULATORY_CARE_PROVIDER_SITE_OTHER): Payer: Self-pay

## 2017-04-23 ENCOUNTER — Ambulatory Visit (INDEPENDENT_AMBULATORY_CARE_PROVIDER_SITE_OTHER): Payer: Medicare HMO

## 2017-04-23 ENCOUNTER — Ambulatory Visit (INDEPENDENT_AMBULATORY_CARE_PROVIDER_SITE_OTHER): Payer: Medicare HMO | Admitting: Orthopaedic Surgery

## 2017-04-23 ENCOUNTER — Encounter (INDEPENDENT_AMBULATORY_CARE_PROVIDER_SITE_OTHER): Payer: Self-pay | Admitting: Orthopaedic Surgery

## 2017-04-23 DIAGNOSIS — M25561 Pain in right knee: Secondary | ICD-10-CM

## 2017-04-23 DIAGNOSIS — M1712 Unilateral primary osteoarthritis, left knee: Secondary | ICD-10-CM

## 2017-04-23 DIAGNOSIS — M25562 Pain in left knee: Secondary | ICD-10-CM | POA: Diagnosis not present

## 2017-04-23 DIAGNOSIS — M1711 Unilateral primary osteoarthritis, right knee: Secondary | ICD-10-CM

## 2017-04-23 DIAGNOSIS — G8929 Other chronic pain: Secondary | ICD-10-CM

## 2017-04-23 NOTE — Progress Notes (Signed)
Office Visit Note   Patient: [name]           Date of Birth: 02-Apr-1970           MRN: [id number] Visit Date: 04/23/2017              Requested by: Fransisca Connors, PA-C [address] [place], [place] [address] PCP: Fransisca Connors, PA-C   Assessment & Plan: Visit Diagnoses:  1. Chronic pain of both knees   2. Unilateral primary osteoarthritis, left knee   3. Unilateral primary osteoarthritis, right knee     Plan: At this point she does wish to proceed with a right total knee arthroplasty sometime in the spring.  I agree with placing steroid injections in both her knees today which she tolerated well.  I showed her knee model and went over in detail what the surgery involves including a thorough discussion about the risk and benefits of surgery including her intraoperative and postoperative course and expectations.  All questions concerns were answered and addressed.  We will work on getting the surgery scheduled and I believe late March versus early April.  Follow-Up Instructions: Return for 2 weeks post-op.   Orders:  Orders Placed This Encounter  Procedures  . Large Joint Inj  . XR KNEE 3 VIEW LEFT  . XR KNEE 3 VIEW RIGHT   No orders of the defined types were placed in this encounter.     Procedures: Large Joint Inj: bilateral knee on 04/23/2017 7:13 PM Indications: diagnostic evaluation and pain Details: 22 G 1.5 in needle, superolateral approach  Arthrogram: No  Outcome: tolerated well, no immediate complications Procedure, treatment alternatives, risks and benefits explained, specific risks discussed. Consent was given by the patient. Immediately prior to procedure a time out was called to verify the correct patient, procedure, equipment, support staff and site/side marked as required. Patient was prepped and draped in the usual sterile fashion.       Clinical Data: No additional findings.   Subjective: Chief Complaint  Patient  presents with  . Left Knee - Pain  The patient's actual chief complaint is bilateral knee pain.  The right is worse than left.  She is someone I seen for many years now.  She weighed well over 400 pounds at one point and is now down in the low 200 pound range over many years of working hard.  She has known and well-documented end-stage arthritis of both her knees.  She is tried everything from weight loss activity modification as well as anti-inflammatories and multiple injections of hyaluronic acid and steroids over the years.  At this point her right knee is gotten so severe she is ready to proceed with knee replacement surgery.  She is even worked on a home exercise program as well as outpatient physical therapy.  Her pain is daily at this point.  It is 10 out of 10.  It is detrimentally affects her activity living, quality of life, and mobility.  HPI  Review of Systems She currently denies any headache, chest pain, shortness of breath, fever, chills, nausea, vomiting.  Objective: Vital Signs: There were no vitals taken for this visit.  Physical Exam She is alert and oriented x3 and in no acute distress Ortho Exam Examination of both knees show mild varus malalignment with significant medial joint line tenderness with good range of motion is significant patellofemoral crepitation. Specialty Comments:  No specialty comments available.  Imaging: Xr Knee 3  View Left  Result Date: 04/23/2017 2 views of the left knee shows severe end-stage arthritis with complete loss of joint space in all 3 compartments with significant periarticular osteophytes in all 3 compartments.  Xr Knee 3 View Right  Result Date: 04/23/2017 2 views of the right knee show severe end-stage arthritis.  There is joint space loss throughout the knee with severe sclerotic changes as well as para-articular osteophytes in all 3 compartments.    PMFS History: Patient Active Problem List   Diagnosis Date Noted  . Chronic  pain of both knees 04/23/2017  . Unilateral primary osteoarthritis, left knee 10/24/2016  . Unilateral primary osteoarthritis, right knee 10/24/2016  . Chronic pain of right knee 07/22/2016  . Chronic pain of left knee 07/22/2016  . Umbilical mass 89/38/1017  . Abnormal mammogram with microcalcification-Right inner lower quadrant 07/21/2013  . Acute renal failure (ARF) ([facility]) 09/02/2012  . Oliguria and anuria 09/02/2012  . Chronic heel ulcer ([facility]) 04/02/2011  . URI (upper respiratory infection) 03/05/2011  . Constipation 03/02/2011  . Lethargy 03/01/2011  . Diabetic foot ulcer ([place]) 02/28/2011  . Heel ulcer due to DM ([place]) 02/19/2011  . Wound, open, hip or thigh 02/19/2011  . Healing pressure ulcer stage III ([place]) 02/18/2011  . Morbidly obese ([place]) 02/18/2011  . Diabetes mellitus type 2 with complications, uncontrolled ([place]) 02/18/2011  . Sleep apnea 02/18/2011  . Hypothyroidism 02/18/2011  . Chronic pain 02/18/2011  . Acute lymphangitis 02/18/2011  . Cellulitis of foot 02/18/2011   Past Medical History:  Diagnosis Date  . Anxiety   . Back pain   . Blood transfusion   . Chronic heel ulcer ([place])   . Chronic kidney disease   . Chronic pain syndrome   . Depression   . Diabetes mellitus    fasting 170-180  . DJD (degenerative joint disease)   . Dysrhythmia   . Fibromyalgia   . GERD (gastroesophageal reflux disease)   . H/O: Bell's palsy 2011  . Heart murmur   . History of MRSA infection OF ULCER  . Hypertension   . Hypothyroidism   . Hypoventilation associated with obesity syndrome ([place])   . Migraine   . Non-healing non-surgical wound    Right hip, has Wound vac to hip.  Started as a skin tear.  . Peripheral vascular disease ([place])   . PONV (postoperative nausea and vomiting)    can be slow to wake up after surgery  . Right foot drop   . Shortness of breath    with Activity  . Sleep apnea    "study shows not bad enough for CPCP."    Family History  Problem  Relation Age of Onset  . Diabetes type II Father   . Diabetes type II Mother   . Anesthesia problems Mother     Past Surgical History:  Procedure Laterality Date  . BACK SURGERY     for lumbar disc disease X2  . 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 BRAIN SURGERY      Plating due to soft spot closing too early- age 64  . BREAST LUMPECTOMY WITH NEEDLE LOCALIZATION Right 08/23/2013   Procedure: RIGHT BREAST LUMPECTOMY WITH NEEDLE LOCALIZATION;  Surgeon: Odis Hollingshead, MD;  Location: [facility];  Service: General;  Laterality: Right;  . CHOLECYSTECTOMY  1984  . I&D EXTREMITY  04/02/2011   Procedure: IRRIGATION AND DEBRIDEMENT EXTREMITY;  Surgeon: Mcarthur Rossetti;  Location: [facility];  Service: Orthopedics;  Laterality: Right;  I&D right heel ulcer, placement of A-cell graft  . INCISION AND DRAINAGE OF WOUND  08/29/2011   Procedure: IRRIGATION AND DEBRIDEMENT WOUND;  Surgeon: Theodoro Kos, DO;  Location: [facility];  Service: Plastics;  Laterality: Right;  . LITHOTRIPSY     2007ish  . right elbow     Social History   Occupational History  . Not on file  Tobacco Use  . Smoking status: Never Smoker  . Smokeless tobacco: Never Used  Substance and Sexual Activity  . Alcohol use: No  . Drug use: No  . Sexual activity: No

## 2017-05-05 ENCOUNTER — Other Ambulatory Visit: Payer: Self-pay | Admitting: Family Medicine

## 2017-05-05 DIAGNOSIS — Z1231 Encounter for screening mammogram for malignant neoplasm of breast: Secondary | ICD-10-CM

## 2017-05-22 ENCOUNTER — Encounter (HOSPITAL_BASED_OUTPATIENT_CLINIC_OR_DEPARTMENT_OTHER): Payer: Medicare HMO | Attending: Internal Medicine

## 2017-05-22 ENCOUNTER — Other Ambulatory Visit (HOSPITAL_COMMUNITY)
Admission: RE | Admit: 2017-05-22 | Discharge: 2017-05-22 | Disposition: A | Discharge status: Home / Self Care | Payer: Medicare HMO | Source: Other Acute Inpatient Hospital | Attending: Internal Medicine | Admitting: Internal Medicine

## 2017-05-22 DIAGNOSIS — Z87891 Personal history of nicotine dependence: Secondary | ICD-10-CM | POA: Diagnosis not present

## 2017-05-22 DIAGNOSIS — E1122 Type 2 diabetes mellitus with diabetic chronic kidney disease: Secondary | ICD-10-CM | POA: Diagnosis not present

## 2017-05-22 DIAGNOSIS — R198 Other specified symptoms and signs involving the digestive system and abdomen: Secondary | ICD-10-CM | POA: Insufficient documentation

## 2017-05-22 DIAGNOSIS — K429 Umbilical hernia without obstruction or gangrene: Secondary | ICD-10-CM | POA: Diagnosis present

## 2017-05-22 DIAGNOSIS — N189 Chronic kidney disease, unspecified: Secondary | ICD-10-CM | POA: Diagnosis not present

## 2017-05-22 DIAGNOSIS — Z794 Long term (current) use of insulin: Secondary | ICD-10-CM | POA: Insufficient documentation

## 2017-05-23 ENCOUNTER — Other Ambulatory Visit: Payer: Self-pay | Admitting: Internal Medicine

## 2017-05-25 LAB — AEROBIC CULTURE W GRAM STAIN (SUPERFICIAL SPECIMEN): Culture: NORMAL

## 2017-05-25 LAB — AEROBIC CULTURE  (SUPERFICIAL SPECIMEN)

## 2017-05-27 ENCOUNTER — Ambulatory Visit: Payer: Self-pay

## 2017-05-28 ENCOUNTER — Other Ambulatory Visit: Payer: Self-pay | Admitting: Internal Medicine

## 2017-05-29 ENCOUNTER — Other Ambulatory Visit: Payer: Self-pay | Admitting: Internal Medicine

## 2017-05-29 ENCOUNTER — Encounter (HOSPITAL_BASED_OUTPATIENT_CLINIC_OR_DEPARTMENT_OTHER): Payer: Medicare HMO | Attending: Internal Medicine

## 2017-05-29 DIAGNOSIS — K429 Umbilical hernia without obstruction or gangrene: Secondary | ICD-10-CM | POA: Insufficient documentation

## 2017-05-29 DIAGNOSIS — N189 Chronic kidney disease, unspecified: Secondary | ICD-10-CM | POA: Insufficient documentation

## 2017-05-29 DIAGNOSIS — E1122 Type 2 diabetes mellitus with diabetic chronic kidney disease: Secondary | ICD-10-CM | POA: Insufficient documentation

## 2017-05-29 DIAGNOSIS — L988 Other specified disorders of the skin and subcutaneous tissue: Secondary | ICD-10-CM | POA: Insufficient documentation

## 2017-06-03 ENCOUNTER — Other Ambulatory Visit: Payer: Self-pay | Admitting: Internal Medicine

## 2017-06-03 DIAGNOSIS — K429 Umbilical hernia without obstruction or gangrene: Secondary | ICD-10-CM

## 2017-06-06 ENCOUNTER — Ambulatory Visit (HOSPITAL_COMMUNITY): Payer: Medicare HMO

## 2017-06-06 ENCOUNTER — Ambulatory Visit (HOSPITAL_COMMUNITY)
Admission: RE | Admit: 2017-06-06 | Discharge: 2017-06-06 | Disposition: A | Discharge status: Home / Self Care | Payer: Medicare HMO | Source: Ambulatory Visit | Attending: Internal Medicine | Admitting: Internal Medicine

## 2017-06-06 DIAGNOSIS — N189 Chronic kidney disease, unspecified: Secondary | ICD-10-CM | POA: Diagnosis not present

## 2017-06-06 DIAGNOSIS — L988 Other specified disorders of the skin and subcutaneous tissue: Secondary | ICD-10-CM | POA: Diagnosis not present

## 2017-06-06 DIAGNOSIS — E1122 Type 2 diabetes mellitus with diabetic chronic kidney disease: Secondary | ICD-10-CM | POA: Diagnosis not present

## 2017-06-06 DIAGNOSIS — K429 Umbilical hernia without obstruction or gangrene: Secondary | ICD-10-CM | POA: Diagnosis not present

## 2017-06-16 ENCOUNTER — Other Ambulatory Visit: Payer: Self-pay | Admitting: Surgery

## 2017-07-08 ENCOUNTER — Encounter (HOSPITAL_COMMUNITY): Payer: Self-pay

## 2017-07-08 NOTE — Pre-Procedure Instructions (Addendum)
[name]  07/08/2017      [facility] [place] [address] Phone: [phone] Fax: [phone]    Your procedure is scheduled on Wednesday April 24.  Report to [facility] Admitting at 9:00 A.M.  Call this number if you have problems the morning of surgery:  [phone]   Remember:  Do not eat food or drink liquids after midnight.  Take these medicines the morning of surgery with A SIP OF WATER:   Duloxetine (Cymbalta) Gabapentin (neurontin) Esomeprazole (Nexium) Levothyroxine (synthroid) Topiramate (Topamax) Carisoprodol (Soma) if needed Tylenol if needed Zyrtec if needed Xanax if needed Vistaril if needed Oxycodone- if needed  STOP TAKING Phentermine (Apidex) starting NOW.  DO NOT TAKE Sitagliptan (Januvia) the day of surgery  DO NOT TAKE Liraglutide (Victoza) the morning of surgery.   TAKE HALF dose of Lantus Insulin the night before surgery (25 units)  TAKE HALF dose of Lantus insulin the Day of surgery (25 units)  7 days prior to surgery STOP taking any Aspirin(unless otherwise instructed by your surgeon), Aleve, Naproxen, Ibuprofen, Motrin, Advil, Goody's, BC's, all herbal medications, fish oil, and all vitamins     How to Manage Your Diabetes Before and After Surgery  Why is it important to control my blood sugar before and after surgery? . Improving blood sugar levels before and after surgery helps healing and can limit problems. . A way of improving blood sugar control is eating a healthy diet by: o  Eating less sugar and carbohydrates o  Increasing activity/exercise o  Talking with your doctor about reaching your blood sugar goals . High blood sugars (greater than 180 mg/dL) can raise your risk of infections and slow your recovery, so you will need to focus on controlling your diabetes during the weeks before surgery. . Make  sure that the doctor who takes care of your diabetes knows about your planned surgery including the date and location.  How do I manage my blood sugar before surgery? . Check your blood sugar at least 4 times a day, starting 2 days before surgery, to make sure that the level is not too high or low. o Check your blood sugar the morning of your surgery when you wake up and every 2 hours until you get to the Short Stay unit. . If your blood sugar is less than 70 mg/dL, you will need to treat for low blood sugar: o Do not take insulin. o Treat a low blood sugar (less than 70 mg/dL) with  cup of clear juice (cranberry or apple), 4 glucose tablets, OR glucose gel. Recheck blood sugar in 15 minutes after treatment (to make sure it is greater than 70 mg/dL). If your blood sugar is not greater than 70 mg/dL on recheck, call [phone] o  for further instructions. . Report your blood sugar to the short stay nurse when you get to Short Stay.  . If you are admitted to the hospital after surgery: o Your blood sugar will be checked by the staff and you will probably be given insulin after surgery (instead of oral diabetes medicines) to make sure you have good blood sugar levels. o The goal for blood sugar control after surgery is 80-180 mg/dL.              Do not wear jewelry, make-up or nail polish.  Do not  wear lotions, powders, or perfumes, or deodorant.  Do not shave 48 hours prior to surgery.  Men may shave face and neck.  Do not bring valuables to the hospital.  [facility] is not responsible for any belongings or valuables.  Contacts, dentures or bridgework may not be worn into surgery.  Leave your suitcase in the car.  After surgery it may be brought to your room.  For patients admitted to the hospital, discharge time will be determined by your treatment team.  Patients discharged the day of surgery will not be allowed to drive home.   Special instructions:    [facility]-  Preparing For Surgery  Before surgery, you can play an important role. Because skin is not sterile, your skin needs to be as free of germs as possible. You can reduce the number of germs on your skin by washing with CHG (chlorahexidine gluconate) Soap before surgery.  CHG is an antiseptic cleaner which kills germs and bonds with the skin to continue killing germs even after washing.  Please do not use if you have an allergy to CHG or antibacterial soaps. If your skin becomes reddened/irritated stop using the CHG.  Do not shave (including legs and underarms) for at least 48 hours prior to first CHG shower. It is OK to shave your face.  Please follow these instructions carefully.   1. Shower the NIGHT BEFORE SURGERY and the MORNING OF SURGERY with CHG.   2. If you chose to wash your hair, wash your hair first as usual with your normal shampoo.  3. After you shampoo, rinse your hair and body thoroughly to remove the shampoo.  4. Use CHG as you would any other liquid soap. You can apply CHG directly to the skin and wash gently with a scrungie or a clean washcloth.   5. Apply the CHG Soap to your body ONLY FROM THE NECK DOWN.  Do not use on open wounds or open sores. Avoid contact with your eyes, ears, mouth and genitals (private parts). Wash Face and genitals (private parts)  with your normal soap.  6. Wash thoroughly, paying special attention to the area where your surgery will be performed.  7. Thoroughly rinse your body with warm water from the neck down.  8. DO NOT shower/wash with your normal soap after using and rinsing off the CHG Soap.  9. Pat yourself dry with a CLEAN TOWEL.  10. Wear CLEAN PAJAMAS to bed the night before surgery, wear comfortable clothes the morning of surgery  11. Place CLEAN SHEETS on your bed the night of your first shower and DO NOT SLEEP WITH PETS.    Day of Surgery: Do not apply any deodorants/lotions. Please wear clean clothes to the hospital/surgery  center.      Please read over the following fact sheets that you were given. Coughing and Deep Breathing and Surgical Site Infection Prevention

## 2017-07-09 ENCOUNTER — Encounter (HOSPITAL_COMMUNITY): Payer: Self-pay | Admitting: Urology

## 2017-07-09 ENCOUNTER — Other Ambulatory Visit: Payer: Self-pay

## 2017-07-09 ENCOUNTER — Encounter (HOSPITAL_COMMUNITY)
Admission: RE | Admit: 2017-07-09 | Discharge: 2017-07-09 | Disposition: A | Discharge status: Home / Self Care | Payer: Medicare HMO | Source: Ambulatory Visit | Attending: Surgery | Admitting: Surgery

## 2017-07-09 DIAGNOSIS — Z794 Long term (current) use of insulin: Secondary | ICD-10-CM | POA: Insufficient documentation

## 2017-07-09 DIAGNOSIS — G894 Chronic pain syndrome: Secondary | ICD-10-CM | POA: Diagnosis not present

## 2017-07-09 DIAGNOSIS — F419 Anxiety disorder, unspecified: Secondary | ICD-10-CM | POA: Insufficient documentation

## 2017-07-09 DIAGNOSIS — E1122 Type 2 diabetes mellitus with diabetic chronic kidney disease: Secondary | ICD-10-CM | POA: Insufficient documentation

## 2017-07-09 DIAGNOSIS — I129 Hypertensive chronic kidney disease with stage 1 through stage 4 chronic kidney disease, or unspecified chronic kidney disease: Secondary | ICD-10-CM | POA: Insufficient documentation

## 2017-07-09 DIAGNOSIS — Z79899 Other long term (current) drug therapy: Secondary | ICD-10-CM | POA: Diagnosis not present

## 2017-07-09 DIAGNOSIS — Z79891 Long term (current) use of opiate analgesic: Secondary | ICD-10-CM | POA: Insufficient documentation

## 2017-07-09 DIAGNOSIS — Z01810 Encounter for preprocedural cardiovascular examination: Secondary | ICD-10-CM | POA: Insufficient documentation

## 2017-07-09 DIAGNOSIS — N189 Chronic kidney disease, unspecified: Secondary | ICD-10-CM | POA: Insufficient documentation

## 2017-07-09 DIAGNOSIS — Z01812 Encounter for preprocedural laboratory examination: Secondary | ICD-10-CM | POA: Diagnosis present

## 2017-07-09 HISTORY — DX: Anemia, unspecified: D64.9

## 2017-07-09 HISTORY — DX: Personal history of urinary calculi: Z87.442

## 2017-07-09 LAB — SURGICAL PCR SCREEN
MRSA, PCR: NEGATIVE
Staphylococcus aureus: NEGATIVE

## 2017-07-09 LAB — GLUCOSE, CAPILLARY: Glucose-Capillary: 219 mg/dL — ABNORMAL HIGH (ref 65–99)

## 2017-07-09 NOTE — Progress Notes (Signed)
PCP - Earley Brooke (in care everywhere) Cardiologist - Dr Oran Rein over 2 years ago to follow slight heart murmur, pt reports no cardiac issues and reports no cardiac tests done other than EKGs  EKG - 07/09/2017  Stress Test - denies ECHO - denies  Fasting Blood Sugar - pt reports normally 89-189, recently taken off insulin by PCP. Pt Hgb A1c 10.5 in care everywhere from 07/03/17. Pt states her CBGs prior to being off insulin were 60-80. CBG today 219. Pt reports she has not received any lab results from the 07/03/17 draw but will call PCP and notify of her blood sugars and upcoming surgery. Pt states she will continue to monitor CBG over next week and call PCP again if over 200. Pt to call short stay if any medication changes for update prior to surgery.   Anesthesia review: A1c 10.5  Patient denies shortness of breath, fever, cough and chest pain at PAT appointment   Patient verbalized understanding of instructions that were given to them at the PAT appointment. Patient was also instructed that they will need to review over the PAT instructions again at home before surgery.

## 2017-07-10 ENCOUNTER — Encounter (HOSPITAL_COMMUNITY): Payer: Self-pay | Admitting: Emergency Medicine

## 2017-07-10 NOTE — Progress Notes (Signed)
Anesthesia Chart Review:   Case:  [id number] Date/Time:  07/16/17 1045   Procedures:      UMBILICAL HERNIA REPAIR (N/A )     INSERTION OF MESH (N/A )     EXCISION OF UMBILICUS (N/A )   Anesthesia type:  General   Pre-op diagnosis:  Umbilical hernia with chronic umbilical wound   Location:  [facility] OR ROOM 02 / [facility] OR   Surgeon:  Coralie Keens, MD      DISCUSSION: BMI is 45. DM is uncontrolled, A1c was 10.5 on 07/03/17. Pt notified PCP of uncontrolled DM, but PCP cannot see pt to adjust meds until May. Dr. Trevor Mace office notified about uncontrolled DM.    VS: BP (!) 152/88   Pulse 94   Temp 36.7 C   Resp 20   Ht 5' 5.5" (1.664 m)   Wt 278 lb 12.8 oz (126.5 kg)   SpO2 95%   BMI 45.69 kg/m   PROVIDERS: - PCP is Earley Brooke, PA (notes in care everywhere)    LABS: Glucose 219. HbA1c was 10.5 on 07/03/17 (care everywhere) (all labs ordered are listed, but only abnormal results are displayed)  Labs Reviewed  GLUCOSE, CAPILLARY - Abnormal; Notable for the following components:      Result Value   Glucose-Capillary 219 (*)    All other components within normal limits  SURGICAL PCR SCREEN    EKG 07/09/17: NSR. Nonspecific T wave abnormality   Past Medical History:  Diagnosis Date  . Anemia   . Anxiety    panic attacks  . Back pain   . Blood transfusion   . Chronic heel ulcer ([place])   . Chronic kidney disease   . Chronic pain syndrome   . Depression   . Diabetes mellitus    fasting 170-180  . DJD (degenerative joint disease)   . Dysrhythmia    pt unsure what this was  . Fibromyalgia   . GERD (gastroesophageal reflux disease)   . H/O: Bell's palsy 2011  . Heart murmur    "slight one"  . History of kidney stones   . History of MRSA infection OF ULCER  . Hypertension   . Hypothyroidism   . Hypoventilation associated with obesity syndrome ([place])   . Migraine   . Non-healing non-surgical wound    Right hip, has Wound vac to hip.  Started as a skin tear.  .  Peripheral vascular disease ([place])   . PONV (postoperative nausea and vomiting)    can be slow to wake up after surgery  . Right foot drop   . Shortness of breath    with Activity  . Sleep apnea    "study shows not bad enough for CPAP.", pt denies    Past Surgical History:  Procedure Laterality Date  . BACK SURGERY     for lumbar disc disease X2  . [place]   Tumor removed- has steel plate  . BRAIN SURGERY      Plating due to soft spot closing too early- age 72  . BREAST LUMPECTOMY WITH NEEDLE LOCALIZATION Right 08/23/2013   Procedure: RIGHT BREAST LUMPECTOMY WITH NEEDLE LOCALIZATION;  Surgeon: Odis Hollingshead, MD;  Location: [facility];  Service: General;  Laterality: Right;  . CHOLECYSTECTOMY  1984  . I&D EXTREMITY  04/02/2011   Procedure: IRRIGATION AND DEBRIDEMENT EXTREMITY;  Surgeon: Mcarthur Rossetti;  Location: [facility];  Service: Orthopedics;  Laterality: Right;  I&D right heel ulcer, placement of A-cell graft  .  INCISION AND DRAINAGE OF WOUND  08/29/2011   Procedure: IRRIGATION AND DEBRIDEMENT WOUND;  Surgeon: Theodoro Kos, DO;  Location: [facility];  Service: Plastics;  Laterality: Right;  . LITHOTRIPSY     2007ish  . right elbow      MEDICATIONS: . acetaminophen (TYLENOL) 500 MG tablet  . ALPRAZolam (XANAX) 1 MG tablet  . atorvastatin (LIPITOR) 20 MG tablet  . Biotin 10000 MCG TABS  . carisoprodol (SOMA) 350 MG tablet  . cetirizine (ZYRTEC) 10 MG tablet  . Cyanocobalamin (VITAMIN B-12 IJ)  . DULoxetine (CYMBALTA) 60 MG capsule  . eletriptan (RELPAX) 40 MG tablet  . esomeprazole (NEXIUM) 40 MG capsule  . fentaNYL (DURAGESIC - DOSED MCG/HR) 75 MCG/HR  . furosemide (LASIX) 20 MG tablet  . gabapentin (NEURONTIN) 600 MG tablet  . hydrOXYzine (VISTARIL) 25 MG capsule  . insulin glargine (LANTUS) 100 UNIT/ML injection  . levothyroxine (SYNTHROID, LEVOTHROID) 125 MCG tablet  . lidocaine (LIDODERM) 5 %  . Liraglutide (VICTOZA) 18 MG/3ML SOLN  . milk thistle 175 MG  tablet  . nystatin (MYCOSTATIN/NYSTOP) 100000 UNIT/GM POWD  . omega-3 acid ethyl esters (LOVAZA) 1 g capsule  . oxyCODONE (ROXICODONE) 15 MG immediate release tablet  . phentermine (ADIPEX-P) 37.5 MG tablet  . Prenatal Vit-Fe Fumarate-FA (PRENATAL PLUS PO)  . promethazine (PHENERGAN) 25 MG tablet  . senna (SENOKOT) 8.6 MG TABS tablet  . sitaGLIPtin (JANUVIA) 50 MG tablet  . SUMAtriptan (IMITREX) 100 MG tablet  . topiramate (TOPAMAX) 200 MG tablet  . triamcinolone cream (KENALOG) 0.1 %   No current facility-administered medications for this encounter.    - Pt instructed to stop phentermine at pre-admission testing appointment 07/09/17   If glucose acceptable day of surgery, I anticipate pt can proceed with surgery as scheduled.   Willeen Cass, FNP-BC [name]/Anesthesiology Phone: [phone] 07/15/2017 9:47 AM

## 2017-07-15 MED ORDER — VANCOMYCIN HCL 10 G IV SOLR
1500.0000 mg | INTRAVENOUS | Status: DC
  Filled 2017-07-15: qty 1500

## 2017-07-16 ENCOUNTER — Ambulatory Visit (HOSPITAL_COMMUNITY): Admission: RE | Admit: 2017-07-16 | Payer: Medicare HMO | Source: Ambulatory Visit | Admitting: Surgery

## 2017-07-16 ENCOUNTER — Encounter (HOSPITAL_COMMUNITY): Admission: RE | Payer: Self-pay | Source: Ambulatory Visit

## 2017-07-16 SURGERY — REPAIR, HERNIA, UMBILICAL, ADULT
Anesthesia: General

## 2017-07-21 ENCOUNTER — Ambulatory Visit: Payer: Self-pay

## 2017-07-22 ENCOUNTER — Ambulatory Visit: Payer: Self-pay

## 2017-08-15 ENCOUNTER — Telehealth (INDEPENDENT_AMBULATORY_CARE_PROVIDER_SITE_OTHER): Payer: Self-pay | Admitting: Orthopaedic Surgery

## 2017-08-15 NOTE — Telephone Encounter (Signed)
Can you please tell her ALL we can do is Tuesday or Wednesday morning ONLY, if she can't do that, then we can't work her in

## 2017-08-15 NOTE — Telephone Encounter (Signed)
Patient is scheduled with Dr Ninfa Linden Tuesday at 9:45am

## 2017-08-15 NOTE — Telephone Encounter (Signed)
Thank you Margaret!!

## 2017-08-15 NOTE — Telephone Encounter (Signed)
Patient called asked if she can get an appointment with Artis Delay or Dr Ninfa Linden before she have surgery to remove a hernia. Patient said she is having surgery Aug 21, 2017. Patient said she need an injection in both knees because they are really hurting her. The number to contact patient is [phone]

## 2017-08-19 ENCOUNTER — Encounter (INDEPENDENT_AMBULATORY_CARE_PROVIDER_SITE_OTHER): Payer: Self-pay | Admitting: Orthopaedic Surgery

## 2017-08-19 ENCOUNTER — Ambulatory Visit (INDEPENDENT_AMBULATORY_CARE_PROVIDER_SITE_OTHER): Payer: Medicare HMO | Admitting: Orthopaedic Surgery

## 2017-08-19 DIAGNOSIS — G8929 Other chronic pain: Secondary | ICD-10-CM

## 2017-08-19 DIAGNOSIS — M1712 Unilateral primary osteoarthritis, left knee: Secondary | ICD-10-CM | POA: Diagnosis not present

## 2017-08-19 DIAGNOSIS — M25561 Pain in right knee: Secondary | ICD-10-CM | POA: Diagnosis not present

## 2017-08-19 DIAGNOSIS — M1711 Unilateral primary osteoarthritis, right knee: Secondary | ICD-10-CM

## 2017-08-19 DIAGNOSIS — M25562 Pain in left knee: Secondary | ICD-10-CM

## 2017-08-19 MED ORDER — METHYLPREDNISOLONE ACETATE 40 MG/ML IJ SUSP
40.0000 mg | INTRAMUSCULAR | Status: AC | PRN
  Administered 2017-08-19: 40 mg via INTRA_ARTICULAR

## 2017-08-19 MED ORDER — LIDOCAINE HCL 1 % IJ SOLN
3.0000 mL | INTRAMUSCULAR | Status: AC | PRN
  Administered 2017-08-19: 3 mL

## 2017-08-19 NOTE — Progress Notes (Signed)
   Procedure Note  Patient: [name]             Date of Birth: Dec 11, 1970           MRN: [id number]             Visit Date: 08/19/2017  Procedures: Visit Diagnoses: Chronic pain of both knees  Unilateral primary osteoarthritis, left knee  Unilateral primary osteoarthritis, right knee  Large Joint Inj: bilateral knee on 08/19/2017 10:24 AM Indications: diagnostic evaluation and pain Details: 22 G 1.5 in needle, superolateral approach  Arthrogram: No  Medications (Right): 40 mg methylPREDNISolone acetate 40 MG/ML; 3 mL lidocaine 1 % Medications (Left): 40 mg methylPREDNISolone acetate 40 MG/ML; 3 mL lidocaine 1 % Outcome: tolerated well, no immediate complications Procedure, treatment alternatives, risks and benefits explained, specific risks discussed. Consent was given by the patient. Immediately prior to procedure a time out was called to verify the correct patient, procedure, equipment, support staff and site/side marked as required. Patient was prepped and draped in the usual sterile fashion.     Patient is well-known to me.  She is previously morbidly obese individual who is lost about the amount of weight over the years.  She has known severe arthritis of both her knees.  She comes in about every 3 months for steroid injections in her knees.  She does wish to proceed with knee replacement surgery at some point.  Unfortunately she has a significant hernia issue that is having surgery next week.  She is here today just for steroid injections.  There is been no other change in her medical status but she understands she cannot have knee replacements until she heals any type of wounds on her body.  On exam both knees have severe patellofemoral crepitation severe deformities.  We were to place a steroid injection to both knees without difficulty.  As always we will see her back in 3 months.

## 2017-08-25 NOTE — Telephone Encounter (Signed)
ERROR

## 2017-09-17 NOTE — Pre-Procedure Instructions (Signed)
[name]  09/17/2017      [facility], [address] AT [address] [place] [address] Phone: [phone] Fax: [phone]    Your procedure is scheduled on 09/24/2017.  [facility] to [facility] Admitting at 1000 A.M.  Call this number if you have problems the morning of surgery:  [phone]   Remember:  Do not eat or drink after midnight the night before your surgery.   Continue all medications as directed by your physician except follow these medication instructions before surgery below    Take these medicines the morning of surgery with A SIP OF WATER: Acetaminophen (Tylenol) - if needed Alprazolam (Xanax) - if needed Certirizine (Zyrtec) - if needed Duloxetine (Cymbalta) Eletriptan (Relpax) - if needed Esomeprazole (Nexium) Gabapentin (Neurontin) Hydroxyzine (Vistaril) - if needed Levothyroxine (Synthroid) Oxycodone (Roxicodone) - if needed Promethazine (Phenergan) - if needed Sumatriptan (Imitrex) - if needed Topiramate (Topamax)  7 days prior to surgery STOP taking any Phentermine (Adipex-P), Aspirin(unless otherwise instructed by your surgeon), Aleve, Naproxen, Ibuprofen, Motrin, Advil, Goody's, BC's, all herbal medications, fish oil, and all vitamins   WHAT DO I DO ABOUT MY DIABETES MEDICATION? Marland Kitchen Do not take oral diabetes medicines (pills) the morning of surgery. - Do NOT take your Sitagliptin (Januvia) the morning of surgery.  . The day of surgery, do not take other diabetes injectables, including Victoza (liraglutide)     How to Manage Your Diabetes Before and After Surgery  Why is it important to control my blood sugar before and after surgery? . Improving blood sugar levels before and after surgery helps healing and can limit problems. . A way of improving blood sugar control is eating a healthy diet by: o  Eating less sugar and carbohydrates o  Increasing  activity/exercise o  Talking with your doctor about reaching your blood sugar goals . High blood sugars (greater than 180 mg/dL) can raise your risk of infections and slow your recovery, so you will need to focus on controlling your diabetes during the weeks before surgery. . Make sure that the doctor who takes care of your diabetes knows about your planned surgery including the date and location.  How do I manage my blood sugar before surgery? . Check your blood sugar at least 4 times a day, starting 2 days before surgery, to make sure that the level is not too high or low. o Check your blood sugar the morning of your surgery when you wake up and every 2 hours until you get to the Short Stay unit. . If your blood sugar is less than 70 mg/dL, you will need to treat for low blood sugar: o Do not take insulin. o Treat a low blood sugar (less than 70 mg/dL) with  cup of clear juice (cranberry or apple), 4 glucose tablets, OR glucose gel. o Recheck blood sugar in 15 minutes after treatment (to make sure it is greater than 70 mg/dL). If your blood sugar is not greater than 70 mg/dL on recheck, call [phone] for further instructions. . Report your blood sugar to the short stay nurse when you get to Short Stay.  . If you are admitted to the hospital after surgery: o Your blood sugar will be checked by the staff and you will probably be given insulin after surgery (instead of oral diabetes medicines) to make sure you have good blood sugar levels. o  The goal for blood sugar control after surgery is 80-180 mg/dL.      Do not wear jewelry, make-up or nail polish.  Do not wear lotions, powders, or perfumes, or deodorant.  Do not shave 48 hours prior to surgery.    Do not bring valuables to the hospital.  [facility] is not responsible for any belongings or valuables.  Hearing aids, eyeglasses, contacts, dentures or bridgework may not be worn into surgery.  Leave your suitcase in the car.  After  surgery it may be brought to your room.  For patients admitted to the hospital, discharge time will be determined by your treatment team.  Patients discharged the day of surgery will not be allowed to drive home.   Name and phone number of your driver:    Special instructions:   [facility]- Preparing For Surgery  Before surgery, you can play an important role. Because skin is not sterile, your skin needs to be as free of germs as possible. You can reduce the number of germs on your skin by washing with CHG (chlorahexidine gluconate) Soap before surgery.  CHG is an antiseptic cleaner which kills germs and bonds with the skin to continue killing germs even after washing.    Oral Hygiene is also important to reduce your risk of infection.  Remember - BRUSH YOUR TEETH THE MORNING OF SURGERY WITH YOUR REGULAR TOOTHPASTE  Please do not use if you have an allergy to CHG or antibacterial soaps. If your skin becomes reddened/irritated stop using the CHG.  Do not shave (including legs and underarms) for at least 48 hours prior to first CHG shower. It is OK to shave your face.  Please follow these instructions carefully.   1. Shower the NIGHT BEFORE SURGERY and the MORNING OF SURGERY with CHG.   2. If you chose to wash your hair, wash your hair first as usual with your normal shampoo.  3. After you shampoo, rinse your hair and body thoroughly to remove the shampoo.  4. Use CHG as you would any other liquid soap. You can apply CHG directly to the skin and wash gently with a scrungie or a clean washcloth.   5. Apply the CHG Soap to your body ONLY FROM THE NECK DOWN.  Do not use on open wounds or open sores. Avoid contact with your eyes, ears, mouth and genitals (private parts). Wash Face and genitals (private parts)  with your normal soap.  6. Wash thoroughly, paying special attention to the area where your surgery will be performed.  7. Thoroughly rinse your body with warm water from the neck  down.  8. DO NOT shower/wash with your normal soap after using and rinsing off the CHG Soap.  9. Pat yourself dry with a CLEAN TOWEL.  10. Wear CLEAN PAJAMAS to bed the night before surgery, wear comfortable clothes the morning of surgery  11. Place CLEAN SHEETS on your bed the night of your first shower and DO NOT SLEEP WITH PETS.    Day of Surgery:  Do not apply any deodorants/lotions.  Please wear clean clothes to the hospital/surgery center.   Remember to brush your teeth WITH YOUR REGULAR TOOTHPASTE.    Please read over the following fact sheets that you were given.

## 2017-09-18 ENCOUNTER — Inpatient Hospital Stay (HOSPITAL_COMMUNITY)
Admission: RE | Admit: 2017-09-18 | Discharge: 2017-09-18 | Disposition: A | Discharge status: Home / Self Care | Payer: Medicare HMO | Source: Ambulatory Visit

## 2017-09-21 ENCOUNTER — Other Ambulatory Visit: Payer: Self-pay | Admitting: Surgery

## 2017-09-22 ENCOUNTER — Encounter (HOSPITAL_COMMUNITY)
Admission: RE | Admit: 2017-09-22 | Discharge: 2017-09-22 | Disposition: A | Discharge status: Home / Self Care | Payer: 59 | Source: Ambulatory Visit | Attending: Surgery | Admitting: Surgery

## 2017-09-22 ENCOUNTER — Encounter (HOSPITAL_COMMUNITY): Payer: Self-pay

## 2017-09-22 ENCOUNTER — Other Ambulatory Visit: Payer: Self-pay

## 2017-09-22 DIAGNOSIS — Z79890 Hormone replacement therapy (postmenopausal): Secondary | ICD-10-CM | POA: Diagnosis not present

## 2017-09-22 DIAGNOSIS — K429 Umbilical hernia without obstruction or gangrene: Secondary | ICD-10-CM | POA: Insufficient documentation

## 2017-09-22 DIAGNOSIS — Z79899 Other long term (current) drug therapy: Secondary | ICD-10-CM | POA: Insufficient documentation

## 2017-09-22 DIAGNOSIS — Z01812 Encounter for preprocedural laboratory examination: Secondary | ICD-10-CM | POA: Insufficient documentation

## 2017-09-22 DIAGNOSIS — G894 Chronic pain syndrome: Secondary | ICD-10-CM | POA: Diagnosis not present

## 2017-09-22 DIAGNOSIS — Z79891 Long term (current) use of opiate analgesic: Secondary | ICD-10-CM | POA: Insufficient documentation

## 2017-09-22 DIAGNOSIS — N189 Chronic kidney disease, unspecified: Secondary | ICD-10-CM | POA: Diagnosis not present

## 2017-09-22 DIAGNOSIS — D649 Anemia, unspecified: Secondary | ICD-10-CM | POA: Insufficient documentation

## 2017-09-22 DIAGNOSIS — F419 Anxiety disorder, unspecified: Secondary | ICD-10-CM | POA: Insufficient documentation

## 2017-09-22 DIAGNOSIS — E1122 Type 2 diabetes mellitus with diabetic chronic kidney disease: Secondary | ICD-10-CM | POA: Diagnosis not present

## 2017-09-22 DIAGNOSIS — I129 Hypertensive chronic kidney disease with stage 1 through stage 4 chronic kidney disease, or unspecified chronic kidney disease: Secondary | ICD-10-CM | POA: Insufficient documentation

## 2017-09-22 DIAGNOSIS — Z9889 Other specified postprocedural states: Secondary | ICD-10-CM | POA: Insufficient documentation

## 2017-09-22 HISTORY — DX: Umbilical hernia without obstruction or gangrene: K42.9

## 2017-09-22 LAB — GLUCOSE, CAPILLARY: Glucose-Capillary: 171 mg/dL — ABNORMAL HIGH (ref 70–99)

## 2017-09-22 LAB — SURGICAL PCR SCREEN
MRSA, PCR: NEGATIVE
Staphylococcus aureus: NEGATIVE

## 2017-09-22 NOTE — Progress Notes (Signed)
Anesthesia Chart Review:   Case:  [id number] Date/Time:  09/24/17 1145   Procedures:      UMBILICAL HERNIA REPAIR (N/A )     INSERTION OF MESH (N/A )     EXCISION OF UMBILICUS (N/A )   Anesthesia type:  General   Pre-op diagnosis:  Umbilical hernia with chronic umbilical wound   Location:  [facility] OR ROOM 02 / [facility]   Surgeon:  Coralie Keens, MD      DISCUSSION: - Pt is a 47 year old female with hx DM, obesity, CKD, anemia   - Surgery was originally scheduled for 07/16/17 but was postponed due to uncontrolled DM (A1c was 10.5 at that time).  DM better controlled now; A1c 8.2   VS: BP (!) 159/87   Pulse 89   Temp 37.1 C   Resp 20   Ht 5' 5.5" (1.664 m)   Wt 297 lb 11.2 oz (135 kg)   SpO2 100%   BMI 48.79 kg/m    PROVIDERS: PCP is  Regan, Ripley Fraise, NP. Last office visit 09/17/17 (notes in care everywhere)  Pt reports she has seen Tollie Eth, MD with cardiology for HTN in the past. Per Dr. Thurman Coyer office, last visit 2011  LABS: Will be obtained day of surgery  - HbA1c 8.2 on 09/17/17  (all labs ordered are listed, but only abnormal results are displayed)  Labs Reviewed  GLUCOSE, CAPILLARY - Abnormal; Notable for the following components:      Result Value   Glucose-Capillary 171 (*)    All other components within normal limits  SURGICAL PCR SCREEN     EKG 07/09/17: NSR. Nonspecific T wave abnormality   Past Medical History:  Diagnosis Date  . Anemia   . Anxiety    panic attacks  . Back pain   . Blood transfusion   . Chronic heel ulcer ([place])   . Chronic kidney disease   . Chronic pain syndrome   . Depression   . Diabetes mellitus    fasting 170-180  . DJD (degenerative joint disease)   . Dysrhythmia    pt unsure what this was  . Fibromyalgia   . GERD (gastroesophageal reflux disease)   . H/O: Bell's palsy 2011  . Heart murmur    "slight one"  . History of kidney stones   . History of MRSA infection OF ULCER  . Hypertension   . Hypothyroidism    . Hypoventilation associated with obesity syndrome ([place])   . Migraine   . Non-healing non-surgical wound    Right hip, has Wound vac to hip.  Started as a skin tear.  . Peripheral vascular disease ([place])   . PONV (postoperative nausea and vomiting)    can be slow to wake up after surgery  . Right foot drop   . Shortness of breath    with Activity  . Sleep apnea    "study shows not bad enough for CPAP.", pt denies  . Umbilical hernia     Past Surgical History:  Procedure Laterality Date  . BACK SURGERY     for lumbar disc disease X2  . 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 BRAIN SURGERY      Plating due to soft spot closing too early- age 32  . BREAST LUMPECTOMY WITH NEEDLE LOCALIZATION Right 08/23/2013   Procedure: RIGHT BREAST LUMPECTOMY WITH NEEDLE LOCALIZATION;  Surgeon: Odis Hollingshead, MD;  Location: [facility];  Service:  General;  Laterality: Right;  . CHOLECYSTECTOMY  1984  . EYE SURGERY     eye lift  . I&D EXTREMITY  04/02/2011   Procedure: IRRIGATION AND DEBRIDEMENT EXTREMITY;  Surgeon: Mcarthur Rossetti;  Location: [facility];  Service: Orthopedics;  Laterality: Right;  I&D right heel ulcer, placement of A-cell graft  . INCISION AND DRAINAGE OF WOUND  08/29/2011   Procedure: IRRIGATION AND DEBRIDEMENT WOUND;  Surgeon: Theodoro Kos, DO;  Location: [facility];  Service: Plastics;  Laterality: Right;  . LITHOTRIPSY     2007ish  . right elbow      MEDICATIONS: . acetaminophen (TYLENOL) 500 MG tablet  . ALPRAZolam (XANAX) 1 MG tablet  . atorvastatin (LIPITOR) 20 MG tablet  . Biotin 10000 MCG TABS  . carisoprodol (SOMA) 350 MG tablet  . cetirizine (ZYRTEC) 10 MG tablet  . Cyanocobalamin (VITAMIN B-12 IJ)  . DULoxetine (CYMBALTA) 60 MG capsule  . eletriptan (RELPAX) 40 MG tablet  . esomeprazole (NEXIUM) 40 MG capsule  . fentaNYL (DURAGESIC - DOSED MCG/HR) 75 MCG/HR  . furosemide (LASIX) 20 MG tablet  . gabapentin (NEURONTIN) 600 MG tablet  . hydrOXYzine  (VISTARIL) 25 MG capsule  . insulin glargine (LANTUS) 100 UNIT/ML injection  . levothyroxine (SYNTHROID, LEVOTHROID) 125 MCG tablet  . lidocaine (LIDODERM) 5 %  . Liraglutide (VICTOZA) 18 MG/3ML SOLN  . milk thistle 175 MG tablet  . mupirocin ointment (BACTROBAN) 2 %  . nystatin (MYCOSTATIN/NYSTOP) 100000 UNIT/GM POWD  . omega-3 acid ethyl esters (LOVAZA) 1 g capsule  . oxyCODONE (ROXICODONE) 15 MG immediate release tablet  . phentermine (ADIPEX-P) 37.5 MG tablet  . Prenatal Vit-Fe Fumarate-FA (PRENATAL PLUS PO)  . promethazine (PHENERGAN) 25 MG tablet  . senna (SENOKOT) 8.6 MG TABS tablet  . sitaGLIPtin (JANUVIA) 50 MG tablet  . SUMAtriptan (IMITREX) 100 MG tablet  . topiramate (TOPAMAX) 200 MG tablet  . triamcinolone cream (KENALOG) 0.1 %   No current facility-administered medications for this encounter.    - Last dose phentermine 09/21/17.    If no changes, I anticipate pt can proceed with surgery as scheduled.   Willeen Cass, FNP-BC [facility] Short Stay Surgical Center/Anesthesiology Phone: [phone] 09/23/2017 12:40 PM

## 2017-09-22 NOTE — Pre-Procedure Instructions (Addendum)
[name]  09/22/2017    [facility] [place], [address] AT [address] [place] [address] Phone: [phone] Fax: [phone]   Your procedure is scheduled on Wednesday, September 24, 2017  Report to [facility] Admitting at 10:00 A.M.  Call this number if you have problems the morning of surgery:  [phone]   Remember: Drink your Ensure Pre- Surgery carbohydrate drink by 9:00A.M. ( 3 hours prior to surgery).  Do not eat or drink after midnight Tuesday, September 23, 2017  Take these medicines the morning of surgery with A SIP OF WATER: atorvastatin (LIPITOR), DULoxetine (CYMBALTA),  esomeprazole (NEXIUM),  gabapentin (NEURONTIN),  levothyroxine (SYNTHROID), topiramate (TOPAMAX),  if needed: pain medication (Tylenol OR Oxycodone), anxiety medication ( Xanax OR hydrOXYzine), cetirizine (ZYRTEC) for allergies, promethazine (PHENERGAN) for nausea or vomiting, for headache ( IMITREX OR eletriptan RELPAX) Stop taking Aspirin ( unless advised otherwise by surgeon), vitamins, fish oil (Lovaza),  Biotin,  Milk Thistle, phentermine (ADIPEX-P) and herbal medications. Do not take any NSAIDs ie: Ibuprofen, Advil, Naproxen (Aleve), Motrin, BC and Goody Powder; stop now.    How to Manage Your Diabetes Before and After Surgery  Why is it important to control my blood sugar before and after surgery? . Improving blood sugar levels before and after surgery helps healing and can limit problems. . A way of improving blood sugar control is eating a healthy diet by: o  Eating less sugar and carbohydrates o  Increasing activity/exercise o  Talking with your doctor about reaching your blood sugar goals . High blood sugars (greater than 180 mg/dL) can raise your risk of infections and slow your recovery, so you will need to focus on controlling your diabetes during the weeks before surgery. . Make sure that the doctor who takes  care of your diabetes knows about your planned surgery including the date and location.  How do I manage my blood sugar before surgery? . Check your blood sugar at least 4 times a day, starting 2 days before surgery, to make sure that the level is not too high or low. o Check your blood sugar the morning of your surgery when you wake up and every 2 hours until you get to the Short Stay unit. . If your blood sugar is less than 70 mg/dL, you will need to treat for low blood sugar: o Do not take insulin. o Treat a low blood sugar (less than 70 mg/dL) with  cup of clear juice (cranberry or apple), 4 glucose tablets, OR glucose gel. Recheck blood sugar in 15 minutes after treatment (to make sure it is greater than 70 mg/dL). If your blood sugar is not greater than 70 mg/dL on recheck, call [phone] o  for further instructions. . Report your blood sugar to the short stay nurse when you get to Short Stay.  . If you are admitted to the hospital after surgery: o Your blood sugar will be checked by the staff and you will probably be given insulin after surgery (instead of oral diabetes medicines) to make sure you have good blood sugar levels. o The goal for blood sugar control after surgery is 80-180 mg/dL  WHAT DO I DO ABOUT MY DIABETES MEDICATION?  Marland Kitchen Do not take oral diabetes medicines (pills) the morning of surgery such as sitaGLIPtin (Januvia).  . THE NIGHT BEFORE SURGERY, take 22 units of Lantus insulin.    Marland Kitchen  The day of surgery, do not take other diabetes injectables, including Byetta (exenatide), Bydureon (exenatide ER), Victoza (liraglutide), or Trulicity (dulaglutide).  . If your CBG is greater than 220 mg/dL, you may take  of your sliding scale (correction) dose of insulin.  Reviewed and Endorsed by [facility] Patient Education Committee, August 2015  Do not wear jewelry, make-up or nail polish.  Do not wear lotions, powders, or perfumes, or deodorant.  Do not shave 48 hours prior  to surgery.     Do not bring valuables to the hospital.  [facility] is not responsible for any belongings or valuables.  Contacts, dentures or bridgework may not be worn into surgery.  Leave your suitcase in the car.  After surgery it may be brought to your room. Patients discharged the day of surgery will not be allowed to drive home.  Special instructions: Shower the night before and the morning of surgery with CHG. Please read over the following fact sheets that you were given. Pain Booklet, Coughing and Deep Breathing and Surgical Site Infection Prevention

## 2017-09-22 NOTE — Progress Notes (Signed)
Pt denies any acute cardiopulmonary issues. Pt under the care of Dr. Wynonia Lawman, Cardiology. Pt denies having a stress test and cardiac cath. Pt denies having a chest x ray within the last year. Pt stated that labs were drawn at PCP's office on 09/17/17 (on chart). Please draw stat CBC, BMP and urine pregnancy on DOS. Pt stated that her last dose of Phentermine was 09/21/17. See anesthesia note.

## 2017-09-23 ENCOUNTER — Inpatient Hospital Stay (HOSPITAL_COMMUNITY): Admission: RE | Admit: 2017-09-23 | Payer: 59 | Source: Ambulatory Visit

## 2017-09-23 NOTE — H&P (Signed)
[name]  Location: [facility] Patient #: [id number] DOB: [birth year] Single / Language: Cleophus Molt / Race: White Female   History of Present Illness (Heather Donlan A. Ninfa Linden MD The patient is a 47 year old female who presents for an evaluation of a hernia. This is a 47 year old female referred by Dr. 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 She has multiple medical issues. She has had multiple surgical procedures. She has a known umbilical hernia. She has had intermittent drainage from the umbilicus. Because of the depth of the umbilicus and undermining of the skin, and actual wound cannot be visualized. She is morbidly obese but has been losing a significant amount of weight purposefully. It is planned that she is going to have a knee replacement for arthritis later this year. She reports that she has been having issues with the umbilicus for more than a year. She showed me a picture showed excoriated surrounding skin at the umbilicus.   Past Surgical History Levonne Spiller, [place];  Breast Biopsy  Left. Foot Surgery  Right. Gallbladder Surgery - Open  Spinal Surgery - Lower Back  Spinal Surgery Midback   Diagnostic Studies History Andee Poles Education officer, museum, CMA;  Colonoscopy  never Mammogram  1-3 years ago Pap Smear  1-5 years ago  Allergies Levonne Spiller, [place];   Penicillins  Allergies Reconciled   Medication History (Danielle Education officer, museum, [place];  OxyCODONE HCl (15MG  Tablet, Oral) Active. Cymbalta (60MG  Capsule DR Part, Oral) Active. Gabapentin (600MG  Tablet, Oral) Active. Lantus SoloStar (100UNIT/ML Soln Pen-inj, Subcutaneous) Active. Nystop (100000 UNIT/GM Powder, External) Active. Levothyroxine Sodium (125MCG Tablet, Oral) Active. Januvia (50MG  Tablet, Oral) Active. Cyanocobalamin (1000MCG/ML Solution, Injection) Active. Duragesic-75 (75MCG/HR Patch 72HR, Transdermal) Active. Topiramate (200MG  Tablet, Oral)  Active. Medications Reconciled  Social History Agricultural consultant, CMA;   Alcohol use  Occasional alcohol use. No caffeine use  No drug use  Tobacco use  Never smoker.  Family History (Danielle Education officer, museum, [place];  Arthritis  Brother, Father, Mother. Cancer  Family Members In General. Depression  Brother, Family Members In General, Father, Mother. Diabetes Mellitus  Brother, Family Members In General, Father, Mother. Heart Disease  Family Members In General, Father, Mother. Heart disease in female family member before age 67  Heart disease in female family member before age 40  Hypertension  Brother, Family Members In General. Ischemic Bowel Disease  Family Members In General. Kidney Disease  Family Members In General. Migraine Headache  Family Members In General, Mother. Ovarian Cancer  Family Members In General. Seizure disorder  Family Members In General. Thyroid problems  Brother, Mother.  Pregnancy / Birth History Agricultural consultant, [place];  Age at menarche  65 years. Contraceptive History  Intrauterine device. Gravida  1 Irregular periods  Para  0  Other Problems (Danielle Education officer, museum, CMA;  Anxiety Disorder  Arthritis  Back Pain  Chronic Renal Failure Syndrome  Depression  Diabetes Mellitus  Gastroesophageal Reflux Disease  Home Oxygen Use  Hypercholesterolemia  Migraine Headache  Thyroid Disease  Umbilical Hernia Repair     Review of Systems (Danielle Gerrigner CMA;  General Present- Weight Gain. Not Present- Appetite Loss, Chills, Fatigue, Fever, Night Sweats and Weight Loss. Skin Present- New Lesions, Non-Healing Wounds and Rash. Not Present- Change in Wart/Mole, Dryness, Hives, Jaundice and Ulcer. HEENT Not Present- Earache, Hearing Loss, Hoarseness, Nose Bleed, Oral Ulcers, Ringing in the Ears, Seasonal Allergies, Sinus Pain, Sore Throat, Visual Disturbances, Wears glasses/contact lenses and Yellow Eyes. Breast Not Present-  Breast Mass, Breast Pain, Nipple Discharge and Skin  Changes. Cardiovascular Present- Chest Pain. Not Present- Difficulty Breathing Lying Down, Leg Cramps, Palpitations, Rapid Heart Rate, Shortness of Breath and Swelling of Extremities. Musculoskeletal Present- Back Pain, Joint Pain, Muscle Pain and Muscle Weakness. Not Present- Joint Stiffness and Swelling of Extremities. Neurological Present- Headaches, Numbness, Tingling and Trouble walking. Not Present- Decreased Memory, Fainting, Seizures, Tremor and Weakness. Psychiatric Present- Anxiety, Change in Sleep Pattern and Frequent crying. Not Present- Bipolar, Depression and Fearful. Endocrine Not Present- Cold Intolerance, Excessive Hunger, Hair Changes, Heat Intolerance, Hot flashes and New Diabetes. Hematology Not Present- Blood Thinners, Easy Bruising, Excessive bleeding, Gland problems, HIV and Persistent Infections.  Vitals (Danielle Gerrigner CMA;   Weight: 284 lb Height: 60in Body Surface Area: 2.17 m Body Mass Index: 55.46 kg/m  Temp.: 29F(Oral)  Pulse: 122 (Regular)  BP: 110/80 (Sitting, Right Wrist, Standard)       Physical Exam (Gal Feldhaus A. Ninfa Linden MD;  General Mental Status-Alert. General Appearance-Consistent with stated age. Hydration-Well hydrated. Voice-Normal.  Head and Neck Head-normocephalic, atraumatic with no lesions or palpable masses. Trachea-midline.  Eye Eyeball - Bilateral-Extraocular movements intact. Sclera/Conjunctiva - Bilateral-No scleral icterus.  Chest and Lung Exam Chest and lung exam reveals -quiet, even and easy respiratory effort with no use of accessory muscles and on auscultation, normal breath sounds, no adventitious sounds and normal vocal resonance. Inspection Chest Wall - Normal. Back - normal.  Cardiovascular Cardiovascular examination reveals -normal heart sounds, regular rate and rhythm with no murmurs and normal pedal pulses  bilaterally.  Abdomen Inspection Skin - Scar - no surgical scars. Hernias - Umbilical hernia - Incarcerated. Note: She has a large, deep umbilicus. There is significant undermining of the skin. There is an odor at the umbilicus. There is a chronically incarcerated umbilical hernia containing a large amount of omentum in the depths of her umbilicus. I cannot visualize a wound because of her anatomy. There is a well-healed incision in her right upper quadrant from her previous cholecystectomy. Palpation/Percussion Palpation and Percussion of the abdomen reveal - Soft, Non Tender, No Rebound tenderness, No Rigidity (guarding) and No hepatosplenomegaly. Auscultation Auscultation of the abdomen reveals - Bowel sounds normal.  Neurologic - Did not examine.  Musculoskeletal - Did not examine.    Assessment & Plan   UMBILICAL HERNIA (I95.1)  Impression: She definitely has a large, chronically incarcerated umbilical hernia which I suspect contains omentum. She also has chronic wounds in the umbilical skin where she is constantly moist as the scan cannot be seen because of the depth of her umbilicus. I recommend an umbilical hernia repair with mesh and complete removal of her umbilicus to prevent ongoing infections. I believe this is necessary because of her future plan knee replacement. Ongoing infections at the umbilicus would risk seeding of the knee replacement. I discussed the surgical procedure with her in detail. I discussed the risks which includes but is not limited to bleeding, infection, injury to surrounding structures, infection of the mesh which would then need to be removed potentially. Cardiopulmonary issues, postoperative recovery, hernia recurrence, etc. She understands and wishes to proceed with surgery which will be scheduled

## 2017-10-13 ENCOUNTER — Encounter (HOSPITAL_COMMUNITY): Payer: Self-pay | Admitting: *Deleted

## 2017-10-13 ENCOUNTER — Other Ambulatory Visit: Payer: Self-pay

## 2017-10-13 NOTE — Progress Notes (Signed)
Spoke with Dr Myrtie Soman( anesthesia) regarding Phentermin patient is taking and last dose was on 10/12/2017.  Dr Kalman Shan stated for patient not to take any more Phentermine prior to surgery.  Called patient and informed of above.  Patient voiced understanding.

## 2017-10-13 NOTE — Progress Notes (Addendum)
[name]  10/13/2017   Your procedure is scheduled on:   Report to [facility] Main  Entrance  Report to admitting at  [place] AM    Call this number if you have problems the morning of surgery [phone]   Remember: Do not eat food or  :After Midnight. Clear liquids from 12 midnite until 0430am of surgery then nothing by  Mouth.       Take these medicines the morning of surgery with A SIP OF WATER:  Synthroid, Oxycodone, Xanax if needed, Cymbalta, Nexium, Gabapentin, Topamax, Take 1/2 dose of Lantus Insulin am of surgery and no there diabetic medications am of surgery.                                   You may not have any metal on your body including hair pins and              piercings  Do not wear jewelry, make-up, lotions, powders or perfumes, deodorant             Do not wear nail polish.  Do not shave  48 hours prior to surgery.     Do not bring valuables to the hospital. [place].  Contacts, dentures or bridgework may not be worn into surgery.  Leave suitcase in the car. After surgery it may be brought to your room.     Patients discharged the day of surgery will not be allowed to drive home.  Name and phone number of your driver:                Please read over the following fact sheets you were given: _____________________________________________________________________             NO SOLID FOOD AFTER MIDNIGHT THE NIGHT PRIOR TO SURGERY. NOTHING BY MOUTH EXCEPT CLEAR LIQUIDS UNTIL 3 HOURS PRIOR TO [place] SURGERY. PLEASE FINISH ENSURE DRINK PER SURGEON ORDER 3 HOURS PRIOR TO SCHEDULED SURGERY TIME WHICH NEEDS TO BE COMPLETED AT ______0430______ . - Preparing for Surgery Before surgery, you can play an important role.  Because skin is not sterile, your skin needs to be as free of germs as possible.  You can reduce the number of germs on your skin by washing with CHG  (chlorahexidine gluconate) soap before surgery.  CHG is an antiseptic cleaner which kills germs and bonds with the skin to continue killing germs even after washing. Please DO NOT use if you have an allergy to CHG or antibacterial soaps.  If your skin becomes reddened/irritated stop using the CHG and inform your nurse when you arrive at Short Stay. Do not shave (including legs and underarms) for at least 48 hours prior to the first CHG shower.  You may shave your face/neck. Please follow these instructions carefully:  1.  Shower with CHG Soap the night before surgery and the  morning of Surgery.  2.  If you choose to wash your hair, wash your hair first as usual with your  normal  shampoo.  3.  After you shampoo, rinse your hair and body thoroughly to remove the  shampoo.  4.  Use CHG as you would any other liquid soap.  You can apply chg directly  to the skin and wash                       Gently with a scrungie or clean washcloth.  5.  Apply the CHG Soap to your body ONLY FROM THE NECK DOWN.   Do not use on face/ open                           Wound or open sores. Avoid contact with eyes, ears mouth and genitals (private parts).                       Wash face,  Genitals (private parts) with your normal soap.             6.  Wash thoroughly, paying special attention to the area where your surgery  will be performed.  7.  Thoroughly rinse your body with warm water from the neck down.  8.  DO NOT shower/wash with your normal soap after using and rinsing off  the CHG Soap.                9.  Pat yourself dry with a clean towel.            10.  Wear clean pajamas.            11.  Place clean sheets on your bed the night of your first shower and do not  sleep with pets. Day of Surgery : Do not apply any lotions/deodorants the morning of surgery.  Please wear clean clothes to the hospital/surgery center.  FAILURE TO FOLLOW THESE INSTRUCTIONS MAY RESULT IN THE CANCELLATION OF  YOUR SURGERY PATIENT SIGNATURE_________________________________  NURSE SIGNATURE__________________________________  ________________________________________________________________________ How to Manage Your Diabetes Before and After Surgery  Why is it important to control my blood sugar before and after surgery? . Improving blood sugar levels before and after surgery helps healing and can limit problems. . A way of improving blood sugar control is eating a healthy diet by: o  Eating less sugar and carbohydrates o  Increasing activity/exercise o  Talking with your doctor about reaching your blood sugar goals . High blood sugars (greater than 180 mg/dL) can raise your risk of infections and slow your recovery, so you will need to focus on controlling your diabetes during the weeks before surgery. . Make sure that the doctor who takes care of your diabetes knows about your planned surgery including the date and location.  How do I manage my blood sugar before surgery? . Check your blood sugar at least 4 times a day, starting 2 days before surgery, to make sure that the level is not too high or low. o Check your blood sugar the morning of your surgery when you wake up and every 2 hours until you get to the Short Stay unit. . If your blood sugar is less than 70 mg/dL, you will need to treat for low blood sugar: o Do not take insulin. o Treat a low blood sugar (less than 70 mg/dL) with  cup of clear juice (cranberry or apple), 4 glucose tablets, OR glucose gel. o Recheck blood sugar in 15 minutes after treatment (to make sure it is greater than 70 mg/dL). If your blood sugar is not greater than 70 mg/dL on recheck, call [phone]  for further instructions. . Report your blood sugar to the short stay nurse when you get to Short Stay.  . If you are admitted to the hospital after surgery: o Your blood sugar will be checked by the staff and you will probably be given insulin after surgery  (instead of oral diabetes medicines) to make sure you have good blood sugar levels. o The goal for blood sugar control after surgery is 80-180 mg/dL.   WHAT DO I DO ABOUT MY DIABETES MEDICATION?  Marland Kitchen Do not take oral diabetes medicines (pills) the morning of surgery.  .        . THE MORNING OF SURGERY, take  1/2 of am dose of Insulin am of surgery   . The day of surgery, do not take other diabetes injectables, including Byetta (exenatide), Bydureon (exenatide ER), Victoza (liraglutide), or Trulicity (dulaglutide).  . If your CBG is greater than 220 mg/dL, you may take  of your sliding scale  . (correction) dose of insulin.    For patients with insulin pumps: Contact your diabetes doctor for specific instructions before surgery. Decrease basal rates by 20% at midnight the night before your surgery. Note that if your surgery is planned to be longer than 2 hours, your insulin pump will be removed and intravenous (IV) insulin will be started and managed by the nurses and the anesthesiologist. You will be able to restart your insulin pump once you are awake and able to manage it.  Make sure to bring insulin pump supplies to the hospital with you in case the  site needs to be changed.  Patient Signature:  Date:   Nurse Signature:  Date:   Reviewed and Endorsed by [facility] Patient Education Committee, August 2015

## 2017-10-14 ENCOUNTER — Encounter (HOSPITAL_COMMUNITY): Admission: RE | Admit: 2017-10-14 | Payer: Medicare HMO | Source: Ambulatory Visit

## 2017-10-15 ENCOUNTER — Encounter (HOSPITAL_COMMUNITY)
Admission: RE | Admit: 2017-10-15 | Discharge: 2017-10-15 | Disposition: A | Discharge status: Home / Self Care | Payer: Medicare HMO | Source: Ambulatory Visit | Attending: Surgery | Admitting: Surgery

## 2017-10-15 ENCOUNTER — Other Ambulatory Visit: Payer: Self-pay

## 2017-10-15 DIAGNOSIS — K429 Umbilical hernia without obstruction or gangrene: Secondary | ICD-10-CM | POA: Diagnosis present

## 2017-10-15 DIAGNOSIS — I10 Essential (primary) hypertension: Secondary | ICD-10-CM | POA: Diagnosis not present

## 2017-10-15 DIAGNOSIS — E1122 Type 2 diabetes mellitus with diabetic chronic kidney disease: Secondary | ICD-10-CM | POA: Diagnosis not present

## 2017-10-15 DIAGNOSIS — N189 Chronic kidney disease, unspecified: Secondary | ICD-10-CM | POA: Diagnosis not present

## 2017-10-15 DIAGNOSIS — Z6841 Body Mass Index (BMI) 40.0 and over, adult: Secondary | ICD-10-CM | POA: Diagnosis not present

## 2017-10-15 DIAGNOSIS — G43909 Migraine, unspecified, not intractable, without status migrainosus: Secondary | ICD-10-CM | POA: Diagnosis not present

## 2017-10-15 DIAGNOSIS — E7800 Pure hypercholesterolemia, unspecified: Secondary | ICD-10-CM | POA: Diagnosis not present

## 2017-10-15 DIAGNOSIS — I129 Hypertensive chronic kidney disease with stage 1 through stage 4 chronic kidney disease, or unspecified chronic kidney disease: Secondary | ICD-10-CM | POA: Diagnosis not present

## 2017-10-15 DIAGNOSIS — E1151 Type 2 diabetes mellitus with diabetic peripheral angiopathy without gangrene: Secondary | ICD-10-CM | POA: Diagnosis not present

## 2017-10-15 DIAGNOSIS — Z9981 Dependence on supplemental oxygen: Secondary | ICD-10-CM | POA: Diagnosis not present

## 2017-10-15 DIAGNOSIS — Z79891 Long term (current) use of opiate analgesic: Secondary | ICD-10-CM | POA: Diagnosis not present

## 2017-10-15 DIAGNOSIS — K420 Umbilical hernia with obstruction, without gangrene: Secondary | ICD-10-CM | POA: Diagnosis not present

## 2017-10-15 DIAGNOSIS — E039 Hypothyroidism, unspecified: Secondary | ICD-10-CM | POA: Diagnosis not present

## 2017-10-15 DIAGNOSIS — F329 Major depressive disorder, single episode, unspecified: Secondary | ICD-10-CM | POA: Diagnosis not present

## 2017-10-15 DIAGNOSIS — F419 Anxiety disorder, unspecified: Secondary | ICD-10-CM | POA: Diagnosis not present

## 2017-10-15 DIAGNOSIS — Z79890 Hormone replacement therapy (postmenopausal): Secondary | ICD-10-CM | POA: Diagnosis not present

## 2017-10-15 DIAGNOSIS — Z79899 Other long term (current) drug therapy: Secondary | ICD-10-CM | POA: Diagnosis not present

## 2017-10-15 DIAGNOSIS — Z8249 Family history of ischemic heart disease and other diseases of the circulatory system: Secondary | ICD-10-CM | POA: Diagnosis not present

## 2017-10-15 DIAGNOSIS — Z794 Long term (current) use of insulin: Secondary | ICD-10-CM | POA: Diagnosis not present

## 2017-10-15 DIAGNOSIS — K219 Gastro-esophageal reflux disease without esophagitis: Secondary | ICD-10-CM | POA: Diagnosis not present

## 2017-10-15 LAB — BASIC METABOLIC PANEL
Anion gap: 7 (ref 5–15)
BUN: 18 mg/dL (ref 6–20)
CO2: 21 mmol/L — ABNORMAL LOW (ref 22–32)
Calcium: 8.9 mg/dL (ref 8.9–10.3)
Chloride: 111 mmol/L (ref 98–111)
Creatinine, Ser: 0.93 mg/dL (ref 0.44–1.00)
GFR calc Af Amer: >60 mL/min (ref >60)
GFR calc non Af Amer: >60 mL/min (ref >60)
Glucose, Bld: 200 mg/dL — ABNORMAL HIGH (ref 70–99)
Potassium: 3.6 mmol/L (ref 3.5–5.1)
Sodium: 139 mmol/L (ref 135–145)

## 2017-10-15 LAB — CBC
HCT: 40.7 % (ref 36.0–46.0)
Hemoglobin: 13.8 g/dL (ref 12.0–15.0)
MCH: 30.7 pg (ref 26.0–34.0)
MCHC: 33.9 g/dL (ref 30.0–36.0)
MCV: 90.6 fL (ref 78.0–100.0)
Platelets: 215 10*3/uL (ref 150–400)
RBC: 4.49 MIL/uL (ref 3.87–5.11)
RDW: 13 % (ref 11.5–15.5)
WBC: 10 10*3/uL (ref 4.0–10.5)

## 2017-10-15 LAB — GLUCOSE, CAPILLARY: Glucose-Capillary: 179 mg/dL — ABNORMAL HIGH (ref 70–99)

## 2017-10-15 NOTE — H&P (Signed)
[name]  Location: [facility] Patient #: [id number] DOB: 1970-12-27 Single / Language: Cleophus Molt / Race: White Female   History of Present Illness   The patient is a 47 year old female who presents for an evaluation of a hernia. This is a 47 year old female referred by Dr. 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 She has multiple medical issues. She has had multiple surgical procedures. She has a known umbilical hernia. She has had intermittent drainage from the umbilicus. Because of the depth of the umbilicus and undermining of the skin, and actual wound cannot be visualized. She is morbidly obese but has been losing a significant amount of weight purposefully. It is planned that she is going to have a knee replacement for arthritis later this year. She reports that she has been having issues with the umbilicus for more than a year. She showed me a picture showed excoriated surrounding skin at the umbilicus.   Past Surgical History Breast Biopsy  Left. Foot Surgery  Right. Gallbladder Surgery - Open  Spinal Surgery - Lower Back  Spinal Surgery Midback   Diagnostic Studies History  Colonoscopy  never Mammogram  1-3 years ago Pap Smear  1-5 years ago  Allergies Penicillins  Allergies Reconciled   Medication History  OxyCODONE HCl (15MG  Tablet, Oral) Active. Cymbalta (60MG  Capsule DR Part, Oral) Active. Gabapentin (600MG  Tablet, Oral) Active. Lantus SoloStar (100UNIT/ML Soln Pen-inj, Subcutaneous) Active. Nystop (100000 UNIT/GM Powder, External) Active. Levothyroxine Sodium (125MCG Tablet, Oral) Active. Januvia (50MG  Tablet, Oral) Active. Cyanocobalamin (1000MCG/ML Solution, Injection) Active. Duragesic-75 (75MCG/HR Patch 72HR, Transdermal) Active. Topiramate (200MG  Tablet, Oral) Active. Medications Reconciled  Social History  Alcohol use  Occasional alcohol use. No caffeine use  No drug use  Tobacco  use  Never smoker.  Family History  Arthritis  Brother, Father, Mother. Cancer  Family Members In General. Depression  Brother, Family Members In General, Father, Mother. Diabetes Mellitus  Brother, Family Members In General, Father, Mother. Heart Disease  Family Members In General, Father, Mother. Heart disease in female family member before age 97  Heart disease in female family member before age 41  Hypertension  Brother, Family Members In General. Ischemic Bowel Disease  Family Members In General. Kidney Disease  Family Members In General. Migraine Headache  Family Members In General, Mother. Ovarian Cancer  Family Members In General. Seizure disorder  Family Members In General. Thyroid problems  Brother, Mother.    Other Problems  Anxiety Disorder  Arthritis  Back Pain  Chronic Renal Failure Syndrome  Depression  Diabetes Mellitus  Gastroesophageal Reflux Disease  Home Oxygen Use  Hypercholesterolemia  Migraine Headache  Thyroid Disease  Umbilical Hernia Repair     Review of Systems  General Present- Weight Gain. Not Present- Appetite Loss, Chills, Fatigue, Fever, Night Sweats and Weight Loss. Skin Present- New Lesions, Non-Healing Wounds and Rash. Not Present- Change in Wart/Mole, Dryness, Hives, Jaundice and Ulcer. HEENT Not Present- Earache, Hearing Loss, Hoarseness, Nose Bleed, Oral Ulcers, Ringing in the Ears, Seasonal Allergies, Sinus Pain, Sore Throat, Visual Disturbances, Wears glasses/contact lenses and Yellow Eyes. Breast Not Present- Breast Mass, Breast Pain, Nipple Discharge and Skin Changes. Cardiovascular Present- Chest Pain. Not Present- Difficulty Breathing Lying Down, Leg Cramps, Palpitations, Rapid Heart Rate, Shortness of Breath and Swelling of Extremities. Musculoskeletal Present- Back Pain, Joint Pain, Muscle Pain and Muscle Weakness. Not Present- Joint Stiffness and Swelling of Extremities. Neurological Present- Headaches,  Numbness, Tingling and Trouble walking. Not Present- Decreased Memory, Fainting,  Seizures, Tremor and Weakness. Psychiatric Present- Anxiety, Change in Sleep Pattern and Frequent crying. Not Present- Bipolar, Depression and Fearful. Endocrine Not Present- Cold Intolerance, Excessive Hunger, Hair Changes, Heat Intolerance, Hot flashes and New Diabetes. Hematology Not Present- Blood Thinners, Easy Bruising, Excessive bleeding, Gland problems, HIV and Persistent Infections.  Vitals  Weight: 284 lb Height: 60in Body Surface Area: 2.17 m Body Mass Index: 55.46 kg/m  Temp.: 70F(Oral)  Pulse: 122 (Regular)  BP: 110/80 (Sitting, Right Wrist, Standard)    Physical Exam General Mental Status-Alert. General Appearance-Consistent with stated age. Hydration-Well hydrated. Voice-Normal.  Head and Neck Head-normocephalic, atraumatic with no lesions or palpable masses. Trachea-midline.  Eye Eyeball - Bilateral-Extraocular movements intact. Sclera/Conjunctiva - Bilateral-No scleral icterus.  Chest and Lung Exam Chest and lung exam reveals -quiet, even and easy respiratory effort with no use of accessory muscles and on auscultation, normal breath sounds, no adventitious sounds and normal vocal resonance. Inspection Chest Wall - Normal. Back - normal.  Cardiovascular Cardiovascular examination reveals -normal heart sounds, regular rate and rhythm with no murmurs and normal pedal pulses bilaterally.  Abdomen Inspection Skin - Scar - no surgical scars. Hernias - Umbilical hernia - Incarcerated. Note: She has a large, deep umbilicus. There is significant undermining of the skin. There is an odor at the umbilicus. There is a chronically incarcerated umbilical hernia containing a large amount of omentum in the depths of her umbilicus. I cannot visualize a wound because of her anatomy. There is a well-healed incision in her right upper quadrant from her previous  cholecystectomy. Palpation/Percussion Palpation and Percussion of the abdomen reveal - Soft, Non Tender, No Rebound tenderness, No Rigidity (guarding) and No hepatosplenomegaly. Auscultation Auscultation of the abdomen reveals - Bowel sounds normal.  Neurologic - Did not examine.  Musculoskeletal - Did not examine.    Assessment & Plan   UMBILICAL HERNIA (R41.6)  Impression: She definitely has a large, chronically incarcerated umbilical hernia which I suspect contains omentum. She also has chronic wounds in the umbilical skin where she is constantly moist as the scan cannot be seen because of the depth of her umbilicus. I recommend an umbilical hernia repair with mesh and complete removal of her umbilicus to prevent ongoing infections. I believe this is necessary because of her future plan knee replacement. Ongoing infections at the umbilicus would risk seeding of the knee replacement. I discussed the surgical procedure with her in detail. I discussed the risks which includes but is not limited to bleeding, infection, injury to surrounding structures, infection of the mesh which would then need to be removed potentially. Cardiopulmonary issues, postoperative recovery, hernia recurrence, etc. She understands and wishes to proceed with surgery which will be scheduled

## 2017-10-15 NOTE — Progress Notes (Signed)
Patient 's blood pressure taken in upper right arm at beginning of preop appt with large cuff.  Blood pressure elevated. Patient seemed anxious.  Patient voices no complaints.  Blood pressure taken at end of preop appt with large cuff in upper right arm and adult cuff in lower right arm  Blood pressure within normal limits with both cuffs.

## 2017-10-16 ENCOUNTER — Ambulatory Visit (HOSPITAL_COMMUNITY): Payer: Medicare HMO | Admitting: Emergency Medicine

## 2017-10-16 ENCOUNTER — Other Ambulatory Visit: Payer: Self-pay

## 2017-10-16 ENCOUNTER — Encounter (HOSPITAL_COMMUNITY): Payer: Self-pay

## 2017-10-16 ENCOUNTER — Encounter (HOSPITAL_COMMUNITY)
Admission: RE | Disposition: A | Discharge status: Home / Self Care | Payer: Self-pay | Source: Ambulatory Visit | Attending: Surgery

## 2017-10-16 ENCOUNTER — Observation Stay (HOSPITAL_COMMUNITY)
Admission: RE | Admit: 2017-10-16 | Discharge: 2017-10-17 | Disposition: A | Discharge status: Home / Self Care | Payer: Medicare HMO | Source: Ambulatory Visit | Attending: Surgery | Admitting: Surgery

## 2017-10-16 ENCOUNTER — Ambulatory Visit (HOSPITAL_COMMUNITY): Payer: Medicare HMO | Admitting: Anesthesiology

## 2017-10-16 DIAGNOSIS — G894 Chronic pain syndrome: Secondary | ICD-10-CM | POA: Diagnosis present

## 2017-10-16 DIAGNOSIS — Z8249 Family history of ischemic heart disease and other diseases of the circulatory system: Secondary | ICD-10-CM | POA: Insufficient documentation

## 2017-10-16 DIAGNOSIS — Z794 Long term (current) use of insulin: Secondary | ICD-10-CM | POA: Insufficient documentation

## 2017-10-16 DIAGNOSIS — I10 Essential (primary) hypertension: Secondary | ICD-10-CM | POA: Insufficient documentation

## 2017-10-16 DIAGNOSIS — E1165 Type 2 diabetes mellitus with hyperglycemia: Secondary | ICD-10-CM | POA: Diagnosis present

## 2017-10-16 DIAGNOSIS — N189 Chronic kidney disease, unspecified: Secondary | ICD-10-CM | POA: Insufficient documentation

## 2017-10-16 DIAGNOSIS — K219 Gastro-esophageal reflux disease without esophagitis: Secondary | ICD-10-CM | POA: Insufficient documentation

## 2017-10-16 DIAGNOSIS — G43909 Migraine, unspecified, not intractable, without status migrainosus: Secondary | ICD-10-CM | POA: Insufficient documentation

## 2017-10-16 DIAGNOSIS — E119 Type 2 diabetes mellitus without complications: Secondary | ICD-10-CM | POA: Diagnosis present

## 2017-10-16 DIAGNOSIS — F329 Major depressive disorder, single episode, unspecified: Secondary | ICD-10-CM | POA: Insufficient documentation

## 2017-10-16 DIAGNOSIS — G4730 Sleep apnea, unspecified: Secondary | ICD-10-CM | POA: Diagnosis present

## 2017-10-16 DIAGNOSIS — L8993 Pressure ulcer of unspecified site, stage 3: Secondary | ICD-10-CM | POA: Diagnosis present

## 2017-10-16 DIAGNOSIS — I129 Hypertensive chronic kidney disease with stage 1 through stage 4 chronic kidney disease, or unspecified chronic kidney disease: Secondary | ICD-10-CM | POA: Insufficient documentation

## 2017-10-16 DIAGNOSIS — G8929 Other chronic pain: Secondary | ICD-10-CM | POA: Diagnosis present

## 2017-10-16 DIAGNOSIS — E039 Hypothyroidism, unspecified: Secondary | ICD-10-CM | POA: Insufficient documentation

## 2017-10-16 DIAGNOSIS — Z6841 Body Mass Index (BMI) 40.0 and over, adult: Secondary | ICD-10-CM | POA: Insufficient documentation

## 2017-10-16 DIAGNOSIS — E1151 Type 2 diabetes mellitus with diabetic peripheral angiopathy without gangrene: Secondary | ICD-10-CM | POA: Insufficient documentation

## 2017-10-16 DIAGNOSIS — E1122 Type 2 diabetes mellitus with diabetic chronic kidney disease: Secondary | ICD-10-CM | POA: Insufficient documentation

## 2017-10-16 DIAGNOSIS — E7800 Pure hypercholesterolemia, unspecified: Secondary | ICD-10-CM | POA: Insufficient documentation

## 2017-10-16 DIAGNOSIS — Z79891 Long term (current) use of opiate analgesic: Secondary | ICD-10-CM | POA: Insufficient documentation

## 2017-10-16 DIAGNOSIS — Z79899 Other long term (current) drug therapy: Secondary | ICD-10-CM | POA: Insufficient documentation

## 2017-10-16 DIAGNOSIS — K420 Umbilical hernia with obstruction, without gangrene: Principal | ICD-10-CM | POA: Insufficient documentation

## 2017-10-16 DIAGNOSIS — F419 Anxiety disorder, unspecified: Secondary | ICD-10-CM | POA: Insufficient documentation

## 2017-10-16 DIAGNOSIS — Z79890 Hormone replacement therapy (postmenopausal): Secondary | ICD-10-CM | POA: Insufficient documentation

## 2017-10-16 DIAGNOSIS — E118 Type 2 diabetes mellitus with unspecified complications: Secondary | ICD-10-CM

## 2017-10-16 DIAGNOSIS — Z9981 Dependence on supplemental oxygen: Secondary | ICD-10-CM | POA: Insufficient documentation

## 2017-10-16 DIAGNOSIS — IMO0002 Reserved for concepts with insufficient information to code with codable children: Secondary | ICD-10-CM | POA: Diagnosis present

## 2017-10-16 HISTORY — DX: Acute upper respiratory infection, unspecified: J06.9

## 2017-10-16 HISTORY — DX: Acute kidney failure, unspecified: N17.9

## 2017-10-16 HISTORY — PX: INSERTION OF MESH: SHX5868

## 2017-10-16 HISTORY — PX: UMBILICAL HERNIA REPAIR: SHX196

## 2017-10-16 HISTORY — PX: EXCISION UMBILICAL NODULE: SHX5824

## 2017-10-16 LAB — GLUCOSE, CAPILLARY
Glucose-Capillary: 377 mg/dL — ABNORMAL HIGH (ref 70–99)
Glucose-Capillary: 307 mg/dL — ABNORMAL HIGH (ref 70–99)
Glucose-Capillary: 211 mg/dL — ABNORMAL HIGH (ref 70–99)
Glucose-Capillary: 276 mg/dL — ABNORMAL HIGH (ref 70–99)
Glucose-Capillary: 185 mg/dL — ABNORMAL HIGH (ref 70–99)
Glucose-Capillary: 232 mg/dL — ABNORMAL HIGH (ref 70–99)
Glucose-Capillary: 370 mg/dL — ABNORMAL HIGH (ref 70–99)
Glucose-Capillary: 365 mg/dL — ABNORMAL HIGH (ref 70–99)

## 2017-10-16 LAB — PREGNANCY, URINE: Preg Test, Ur: NEGATIVE

## 2017-10-16 LAB — SURGICAL PCR SCREEN
MRSA, PCR: NEGATIVE
Staphylococcus aureus: NEGATIVE

## 2017-10-16 SURGERY — REPAIR, HERNIA, UMBILICAL, ADULT
Anesthesia: General | Site: Abdomen

## 2017-10-16 MED ORDER — ONDANSETRON 4 MG PO TBDP: 4.0000 mg | ORAL_TABLET | Freq: Four times a day (QID) | ORAL | Status: DC | PRN

## 2017-10-16 MED ORDER — MIDAZOLAM HCL 2 MG/2ML IJ SOLN
INTRAMUSCULAR | Status: AC
  Filled 2017-10-16: qty 2

## 2017-10-16 MED ORDER — GABAPENTIN 300 MG PO CAPS
1200.0000 mg | ORAL_CAPSULE | Freq: Three times a day (TID) | ORAL | Status: DC
  Administered 2017-10-16 – 2017-10-17 (×4): 1200 mg via ORAL
  Filled 2017-10-16 (×4): qty 4

## 2017-10-16 MED ORDER — FENTANYL CITRATE (PF) 100 MCG/2ML IJ SOLN
25.0000 ug | INTRAMUSCULAR | Status: DC | PRN
  Administered 2017-10-16: 25 ug via INTRAVENOUS

## 2017-10-16 MED ORDER — FENTANYL CITRATE (PF) 100 MCG/2ML IJ SOLN
INTRAMUSCULAR | Status: AC
  Filled 2017-10-16: qty 2

## 2017-10-16 MED ORDER — LEVOTHYROXINE SODIUM 25 MCG PO TABS
125.0000 ug | ORAL_TABLET | Freq: Every day | ORAL | Status: DC
  Administered 2017-10-17: 125 ug via ORAL
  Filled 2017-10-16: qty 1

## 2017-10-16 MED ORDER — ROCURONIUM BROMIDE 10 MG/ML (PF) SYRINGE
PREFILLED_SYRINGE | INTRAVENOUS | Status: DC | PRN
  Administered 2017-10-16: 50 mg via INTRAVENOUS

## 2017-10-16 MED ORDER — PROPOFOL 10 MG/ML IV BOLUS
INTRAVENOUS | Status: DC | PRN
  Administered 2017-10-16: 160 mg via INTRAVENOUS

## 2017-10-16 MED ORDER — CIPROFLOXACIN IN D5W 400 MG/200ML IV SOLN
400.0000 mg | INTRAVENOUS | Status: AC
  Administered 2017-10-16: 400 mg via INTRAVENOUS
  Filled 2017-10-16: qty 200

## 2017-10-16 MED ORDER — OXYCODONE HCL 5 MG/5ML PO SOLN
5.0000 mg | Freq: Once | ORAL | Status: DC | PRN
Start: 2017-10-16 — End: 2017-10-16
  Filled 2017-10-16: qty 5

## 2017-10-16 MED ORDER — ONDANSETRON HCL 4 MG/2ML IJ SOLN: 4.0000 mg | Freq: Four times a day (QID) | INTRAMUSCULAR | Status: DC | PRN

## 2017-10-16 MED ORDER — CHLORHEXIDINE GLUCONATE CLOTH 2 % EX PADS: 6.0000 | MEDICATED_PAD | Freq: Once | CUTANEOUS | Status: DC

## 2017-10-16 MED ORDER — OXYCODONE HCL 5 MG PO TABS: 5.0000 mg | ORAL_TABLET | Freq: Once | ORAL | Status: DC | PRN

## 2017-10-16 MED ORDER — LACTATED RINGERS IV SOLN
INTRAVENOUS | Status: DC
  Administered 2017-10-16 (×2): via INTRAVENOUS

## 2017-10-16 MED ORDER — INSULIN ASPART 100 UNIT/ML ~~LOC~~ SOLN
0.0000 [IU] | Freq: Every day | SUBCUTANEOUS | Status: DC
  Administered 2017-10-16: 5 [IU] via SUBCUTANEOUS

## 2017-10-16 MED ORDER — BUPIVACAINE HCL (PF) 0.5 % IJ SOLN
INTRAMUSCULAR | Status: AC
  Filled 2017-10-16: qty 30

## 2017-10-16 MED ORDER — NON FORMULARY: 10.0000 [IU] | Status: DC

## 2017-10-16 MED ORDER — ENOXAPARIN SODIUM 40 MG/0.4ML ~~LOC~~ SOLN
40.0000 mg | SUBCUTANEOUS | Status: DC
  Administered 2017-10-17: 40 mg via SUBCUTANEOUS
  Filled 2017-10-16: qty 0.4

## 2017-10-16 MED ORDER — ONDANSETRON HCL 4 MG/2ML IJ SOLN
INTRAMUSCULAR | Status: AC
  Filled 2017-10-16: qty 2

## 2017-10-16 MED ORDER — METHOCARBAMOL 500 MG PO TABS
500.0000 mg | ORAL_TABLET | Freq: Four times a day (QID) | ORAL | Status: DC | PRN
  Administered 2017-10-17: 500 mg via ORAL
  Filled 2017-10-16: qty 1

## 2017-10-16 MED ORDER — OXYCODONE HCL 5 MG PO TABS
5.0000 mg | ORAL_TABLET | ORAL | Status: DC | PRN
  Administered 2017-10-17: 10 mg via ORAL
  Filled 2017-10-16: qty 2

## 2017-10-16 MED ORDER — DULOXETINE HCL 60 MG PO CPEP
120.0000 mg | ORAL_CAPSULE | Freq: Every day | ORAL | Status: DC
  Administered 2017-10-17: 120 mg via ORAL
  Filled 2017-10-16: qty 2

## 2017-10-16 MED ORDER — PROPOFOL 10 MG/ML IV BOLUS
INTRAVENOUS | Status: AC
  Filled 2017-10-16: qty 20

## 2017-10-16 MED ORDER — FENTANYL CITRATE (PF) 100 MCG/2ML IJ SOLN
INTRAMUSCULAR | Status: AC
Start: 2017-10-16 — End: ?
  Filled 2017-10-16: qty 2

## 2017-10-16 MED ORDER — GABAPENTIN 300 MG PO CAPS
300.0000 mg | ORAL_CAPSULE | ORAL | Status: DC
  Filled 2017-10-16: qty 1

## 2017-10-16 MED ORDER — DEXAMETHASONE SODIUM PHOSPHATE 10 MG/ML IJ SOLN
INTRAMUSCULAR | Status: DC | PRN
  Administered 2017-10-16: 10 mg via INTRAVENOUS

## 2017-10-16 MED ORDER — 0.9 % SODIUM CHLORIDE (POUR BTL) OPTIME
TOPICAL | Status: DC | PRN
  Administered 2017-10-16: 1000 mL

## 2017-10-16 MED ORDER — BUPIVACAINE HCL (PF) 0.5 % IJ SOLN
INTRAMUSCULAR | Status: DC | PRN
  Administered 2017-10-16: 20 mL

## 2017-10-16 MED ORDER — SUGAMMADEX SODIUM 500 MG/5ML IV SOLN
INTRAVENOUS | Status: AC
  Filled 2017-10-16: qty 5

## 2017-10-16 MED ORDER — SODIUM CHLORIDE 0.9 % IV SOLN
INTRAVENOUS | Status: DC
  Administered 2017-10-16: 12:00:00 via INTRAVENOUS

## 2017-10-16 MED ORDER — LIDOCAINE 2% (20 MG/ML) 5 ML SYRINGE
INTRAMUSCULAR | Status: AC
  Filled 2017-10-16: qty 5

## 2017-10-16 MED ORDER — ACETAMINOPHEN 500 MG PO TABS
1000.0000 mg | ORAL_TABLET | ORAL | Status: AC
  Administered 2017-10-16: 1000 mg via ORAL
  Filled 2017-10-16: qty 2

## 2017-10-16 MED ORDER — HYDROMORPHONE HCL 1 MG/ML IJ SOLN
1.0000 mg | INTRAMUSCULAR | Status: DC | PRN
  Administered 2017-10-16 – 2017-10-17 (×6): 1 mg via INTRAVENOUS
  Filled 2017-10-16 (×6): qty 1

## 2017-10-16 MED ORDER — FENTANYL CITRATE (PF) 100 MCG/2ML IJ SOLN
INTRAMUSCULAR | Status: DC | PRN
  Administered 2017-10-16 (×2): 50 ug via INTRAVENOUS
  Administered 2017-10-16: 100 ug via INTRAVENOUS

## 2017-10-16 MED ORDER — DIPHENHYDRAMINE HCL 25 MG PO CAPS: 25.0000 mg | ORAL_CAPSULE | Freq: Four times a day (QID) | ORAL | Status: DC | PRN

## 2017-10-16 MED ORDER — DULOXETINE HCL 60 MG PO CPEP
60.0000 mg | ORAL_CAPSULE | Freq: Every day | ORAL | Status: DC
  Administered 2017-10-16: 60 mg via ORAL
  Filled 2017-10-16: qty 1

## 2017-10-16 MED ORDER — SUGAMMADEX SODIUM 500 MG/5ML IV SOLN
INTRAVENOUS | Status: DC | PRN
  Administered 2017-10-16: 300 mg via INTRAVENOUS

## 2017-10-16 MED ORDER — INSULIN ASPART 100 UNIT/ML ~~LOC~~ SOLN
10.0000 [IU] | SUBCUTANEOUS | Status: AC
  Administered 2017-10-16: 10 [IU] via SUBCUTANEOUS
  Filled 2017-10-16: qty 1

## 2017-10-16 MED ORDER — SCOPOLAMINE 1 MG/3DAYS TD PT72
MEDICATED_PATCH | TRANSDERMAL | Status: AC
  Filled 2017-10-16: qty 1

## 2017-10-16 MED ORDER — FENTANYL CITRATE (PF) 100 MCG/2ML IJ SOLN
INTRAMUSCULAR | Status: AC
  Administered 2017-10-16: 25 ug via INTRAVENOUS
  Filled 2017-10-16: qty 4

## 2017-10-16 MED ORDER — SCOPOLAMINE 1 MG/3DAYS TD PT72
MEDICATED_PATCH | TRANSDERMAL | Status: DC | PRN
  Administered 2017-10-16: 1.5 mg via TRANSDERMAL

## 2017-10-16 MED ORDER — MIDAZOLAM HCL 5 MG/5ML IJ SOLN
INTRAMUSCULAR | Status: DC | PRN
  Administered 2017-10-16 (×2): 1 mg via INTRAVENOUS

## 2017-10-16 MED ORDER — ONDANSETRON HCL 4 MG/2ML IJ SOLN
INTRAMUSCULAR | Status: DC | PRN
  Administered 2017-10-16: 4 mg via INTRAVENOUS

## 2017-10-16 MED ORDER — ALPRAZOLAM 1 MG PO TABS: 1.0000 mg | ORAL_TABLET | Freq: Two times a day (BID) | ORAL | Status: DC | PRN

## 2017-10-16 MED ORDER — DIPHENHYDRAMINE HCL 50 MG/ML IJ SOLN: 25.0000 mg | Freq: Four times a day (QID) | INTRAMUSCULAR | Status: DC | PRN

## 2017-10-16 MED ORDER — INSULIN ASPART 100 UNIT/ML ~~LOC~~ SOLN
0.0000 [IU] | Freq: Three times a day (TID) | SUBCUTANEOUS | Status: DC
  Administered 2017-10-16: 7 [IU] via SUBCUTANEOUS
  Administered 2017-10-16: 20 [IU] via SUBCUTANEOUS
  Administered 2017-10-17: 11 [IU] via SUBCUTANEOUS
  Administered 2017-10-17: 15 [IU] via SUBCUTANEOUS

## 2017-10-16 MED ORDER — LIP MEDEX EX OINT
TOPICAL_OINTMENT | CUTANEOUS | Status: AC
  Filled 2017-10-16: qty 7

## 2017-10-16 MED ORDER — DEXAMETHASONE SODIUM PHOSPHATE 10 MG/ML IJ SOLN
INTRAMUSCULAR | Status: AC
  Filled 2017-10-16: qty 1

## 2017-10-16 SURGICAL SUPPLY — 31 items
ADH SKN CLS APL DERMABOND .7 (GAUZE/BANDAGES/DRESSINGS) ×1
BINDER ABDOMINAL 12 ML 46-62 (SOFTGOODS) IMPLANT
BLADE SURG 15 STRL LF DISP TIS (BLADE) ×1 IMPLANT
BLADE SURG 15 STRL SS (BLADE) ×2
CHLORAPREP W/TINT 26ML (MISCELLANEOUS) ×2 IMPLANT
DECANTER SPIKE VIAL GLASS SM (MISCELLANEOUS) IMPLANT
DERMABOND ADVANCED (GAUZE/BANDAGES/DRESSINGS) ×1
DERMABOND ADVANCED .7 DNX12 (GAUZE/BANDAGES/DRESSINGS) ×1 IMPLANT
DRAPE LAPAROSCOPIC ABDOMINAL (DRAPES) ×2 IMPLANT
ELECT PENCIL ROCKER SW 15FT (MISCELLANEOUS) ×2 IMPLANT
ELECT REM PT RETURN 15FT ADLT (MISCELLANEOUS) ×2 IMPLANT
GAUZE SPONGE 4X4 12PLY STRL (GAUZE/BANDAGES/DRESSINGS) IMPLANT
GLOVE SURG SIGNA 7.5 PF LTX (GLOVE) ×2 IMPLANT
GOWN STRL REUS W/TWL XL LVL3 (GOWN DISPOSABLE) ×4 IMPLANT
KIT BASIN OR (CUSTOM PROCEDURE TRAY) ×2 IMPLANT
MESH VENTRALEX ST 2.5 CRC MED (Mesh General) ×1 IMPLANT
NEEDLE HYPO 22GX1.5 SAFETY (NEEDLE) ×2 IMPLANT
PACK BASIC (CUSTOM PROCEDURE TRAY) ×2 IMPLANT
SPONGE LAP 18X18 RF (DISPOSABLE) ×2 IMPLANT
SUT MNCRL AB 4-0 PS2 18 (SUTURE) ×2 IMPLANT
SUT NOVA NAB DX-16 0-1 5-0 T12 (SUTURE) ×3 IMPLANT
SUT VIC AB 3-0 SH 27 (SUTURE) ×6
SUT VIC AB 3-0 SH 27X BRD (SUTURE) ×1 IMPLANT
SUT VIC AB 3-0 SH 27XBRD (SUTURE) IMPLANT
SUT VICRYL 2 0 18  UND BR (SUTURE)
SUT VICRYL 2 0 18 UND BR (SUTURE) IMPLANT
SYR 20CC LL (SYRINGE) ×2 IMPLANT
SYR BULB IRRIGATION 50ML (SYRINGE) ×1 IMPLANT
TOWEL OR 17X26 10 PK STRL BLUE (TOWEL DISPOSABLE) ×2 IMPLANT
TOWEL OR NON WOVEN STRL DISP B (DISPOSABLE) ×2 IMPLANT
YANKAUER SUCT BULB TIP NO VENT (SUCTIONS) ×1 IMPLANT

## 2017-10-16 NOTE — Op Note (Signed)
UMBILICAL HERNIA REPAIR WITH MESH, INSERTION OF MESH, EXCISION OF UMBILICUS  Procedure Note  CHANISE HABECK 10/16/2017   Pre-op Diagnosis: Umbilical hernia with chronic umbilical wound     Post-op Diagnosis: same  Procedure(s): UMBILICAL HERNIA REPAIR WITH MESH (6.4 cm round prolene patch from [place]) INSERTION OF MESH EXCISION OF UMBILICUS  Surgeon(s): Coralie Keens, MD  Anesthesia: General  Staff:  Circulator: Lenoard Aden, RN Scrub Person: Nathen May, CST  Estimated Blood Loss: Minimal               Specimens: sent to path  Procedure: The patient was brought to the operating room and identified as correct patient.  She is placed upon the operating table and general anesthesia was induced.  Her abdomen was then prepped and draped in usual sterile fashion.  I elliptical incision incorporating the entire umbilicus with a scalpel.  I then dissected down into the subcutaneous tissue with electrocautery.  The patient had a chronically incarcerated hernia containing omentum with excoriated skin at the umbilicus.  I dissected down to the opening in the fascia.  I then completely excised the sac.  I included this in the umbilical skin as a specimen that was sent to pathology.  I was then able to reduce the large amount of omentum back into the abdominal cavity.  The actual fascial defect was approximately 3 cm.  I brought a 6.4 cm round ventral patch onto the field.  It was placed through the fascial opening and then pulled up against the peritoneum with the stay ties.  I sutured the mesh in place with interrupted #1 Novafil sutures.  I then cut the stay ties.  I then closed the fascia over the top of the mesh with #1 Novafil sutures.  Good coverage of the fascial defect and closure of the fascia appeared to be achieved.  I anesthetized the fascia with Marcaine.  I irrigated the wound extensively with saline.  I then closed the subcutaneous tissue with interrupted 3-0 Vicryl sutures  and closed the skin at the running 4-0 Monocryl.  Dermabond was then applied.  The patient tolerated procedure well.  All the counts were correct at the end of the procedure.  The patient was then extubated in the operating room and taken in a stable condition to the recovery room.          Sonya Gunnoe A   Date: 10/16/2017  Time: 8:49 AM

## 2017-10-16 NOTE — Anesthesia Procedure Notes (Signed)
Procedure Name: Intubation Date/Time: 10/16/2017 7:58 AM Performed by: Lavida Patch D, CRNA Pre-anesthesia Checklist: Patient identified, Emergency Drugs available, Suction available and Patient being monitored Patient Re-evaluated:Patient Re-evaluated prior to induction Oxygen Delivery Method: Circle system utilized Preoxygenation: Pre-oxygenation with 100% oxygen Induction Type: IV induction Ventilation: Mask ventilation without difficulty Laryngoscope Size: Mac and 4 Grade View: Grade I Tube type: Oral Number of attempts: 1 Airway Equipment and Method: Stylet,  Oral airway and Retrograde intubation technique Placement Confirmation: ETT inserted through vocal cords under direct vision,  positive ETCO2 and breath sounds checked- equal and bilateral Secured at: 21 cm Tube secured with: Tape Dental Injury: Teeth and Oropharynx as per pre-operative assessment

## 2017-10-16 NOTE — Transfer of Care (Signed)
Immediate Anesthesia Transfer of Care Note  Patient: [name]  Procedure(s) Performed: UMBILICAL HERNIA REPAIR WITH MESH (N/A Abdomen) INSERTION OF MESH (N/A Abdomen) EXCISION OF UMBILICUS (N/A Abdomen)  Patient Location: PACU  Anesthesia Type:General  Level of Consciousness: awake, alert  and oriented  Airway & Oxygen Therapy: Patient Spontanous Breathing and Patient connected to face mask oxygen  Post-op Assessment: Report given to RN and Post -op Vital signs reviewed and stable  Post vital signs: Reviewed and stable  Last Vitals:  Vitals Value Taken Time  BP 130/82 10/16/2017  8:56 AM  Temp 36.6 C 10/16/2017  8:56 AM  Pulse 82 10/16/2017  8:58 AM  Resp 11 10/16/2017  8:58 AM  SpO2 99 % 10/16/2017  8:58 AM  Vitals shown include unvalidated device data.  Last Pain:  Vitals:   10/16/17 0725  TempSrc: Oral  PainSc:       Patients Stated Pain Goal: 3 (38/88/75 7972)  Complications: No apparent anesthesia complications

## 2017-10-16 NOTE — Anesthesia Preprocedure Evaluation (Signed)
Anesthesia Evaluation  Patient identified by MRN, date of birth, ID band Patient awake    Reviewed: Allergy & Precautions, H&P , NPO status , Patient's Chart, lab work & pertinent test results  History of Anesthesia Complications (+) PONV and history of anesthetic complications  Airway Mallampati: II   Neck ROM: full    Dental   Pulmonary shortness of breath, sleep apnea ,    breath sounds clear to auscultation       Cardiovascular hypertension, + Peripheral Vascular Disease   Rhythm:regular Rate:Normal     Neuro/Psych  Headaches, PSYCHIATRIC DISORDERS Anxiety Depression  Neuromuscular disease    GI/Hepatic GERD  ,  Endo/Other  diabetes, Type 2Hypothyroidism Morbid obesity  Renal/GU Renal disease     Musculoskeletal  (+) Arthritis ,   Abdominal   Peds  Hematology   Anesthesia Other Findings   Reproductive/Obstetrics                             Anesthesia Physical Anesthesia Plan  ASA: III  Anesthesia Plan: General   Post-op Pain Management:    Induction: Intravenous  PONV Risk Score and Plan: 4 or greater and Ondansetron, Midazolam, Scopolamine patch - Pre-op and Treatment may vary due to age or medical condition  Airway Management Planned: Oral ETT  Additional Equipment:   Intra-op Plan:   Post-operative Plan: Extubation in OR  Informed Consent: I have reviewed the patients History and Physical, chart, labs and discussed the procedure including the risks, benefits and alternatives for the proposed anesthesia with the patient or authorized representative who has indicated his/her understanding and acceptance.     Plan Discussed with: CRNA, Anesthesiologist and Surgeon  Anesthesia Plan Comments:         Anesthesia Quick Evaluation

## 2017-10-16 NOTE — Progress Notes (Signed)
Inpatient Diabetes Program Recommendations  AACE/ADA: New Consensus Statement on Inpatient Glycemic Control (2015)  Target Ranges:  Prepandial:   less than 140 mg/dL      Peak postprandial:   less than 180 mg/dL (1-2 hours)      Critically ill patients:  140 - 180 mg/dL   Results for Heather, [name] (MRN [id number]) as of 10/16/2017 12:20  Ref. Range 10/16/2017 07:04 10/16/2017 07:42 10/16/2017 08:26 10/16/2017 09:00 10/16/2017 11:47  Glucose-Capillary Latest Ref Range: 70 - 99 mg/dL 307 (H)  10 units NOVOLOG  276 (H) 211 (H)  10 mg Decadron 185 (H) 232 (H)  7 units NOVOLOG     Home DM Meds: Lantus 44 units QHS        Victoza 1.8 mg daily        Januvia 50 mg daily  Current Orders: Novolog Resistant Correction Scale/ SSI (0-20 units) TID AC + HS      MD- Please consider the following in-hospital insulin adjustments:  Please start Lantus 35 units QHS (80% total home dose)  Please start tonight    --Will follow patient during hospitalization--  Wyn Quaker RN, MSN, CDE Diabetes Coordinator Inpatient Glycemic Control Team Team Pager: [phone] (8a-5p)

## 2017-10-16 NOTE — Interval H&P Note (Signed)
History and Physical Interval Note: no change in H and P  10/16/2017 6:48 AM  [name]  has presented today for surgery, with the diagnosis of Umbilical hernia with chronic umbilical wound  The various methods of treatment have been discussed with the patient and family. After consideration of risks, benefits and other options for treatment, the patient has consented to  Procedure(s): UMBILICAL HERNIA REPAIR WITH MESH (N/A) INSERTION OF MESH (N/A) EXCISION OF UMBILICUS (N/A) as a surgical intervention .  The patient's history has been reviewed, patient examined, no change in status, stable for surgery.  I have reviewed the patient's chart and labs.  Questions were answered to the patient's satisfaction.     [name] A

## 2017-10-17 ENCOUNTER — Encounter (HOSPITAL_COMMUNITY): Payer: Self-pay | Admitting: Surgery

## 2017-10-17 DIAGNOSIS — K420 Umbilical hernia with obstruction, without gangrene: Secondary | ICD-10-CM | POA: Diagnosis not present

## 2017-10-17 LAB — GLUCOSE, CAPILLARY
Glucose-Capillary: 298 mg/dL — ABNORMAL HIGH (ref 70–99)
Glucose-Capillary: 335 mg/dL — ABNORMAL HIGH (ref 70–99)

## 2017-10-17 MED ORDER — HYDROMORPHONE HCL 4 MG PO TABS: 4.0000 mg | ORAL_TABLET | Freq: Four times a day (QID) | ORAL | 0 refills | Status: DC | PRN

## 2017-10-17 NOTE — Anesthesia Postprocedure Evaluation (Signed)
Anesthesia Post Note  Patient: [name]  Procedure(s) Performed: UMBILICAL HERNIA REPAIR WITH MESH (N/A Abdomen) INSERTION OF MESH (N/A Abdomen) EXCISION OF UMBILICUS (N/A Abdomen)     Patient location during evaluation: PACU Anesthesia Type: General Level of consciousness: awake and alert Pain management: pain level controlled Vital Signs Assessment: post-procedure vital signs reviewed and stable Respiratory status: spontaneous breathing, nonlabored ventilation, respiratory function stable and patient connected to nasal cannula oxygen Cardiovascular status: blood pressure returned to baseline and stable Postop Assessment: no apparent nausea or vomiting Anesthetic complications: no    Last Vitals:  Vitals:   10/17/17 0135 10/17/17 0602  BP: 113/68 110/73  Pulse: 75 68  Resp: 18 18  Temp: 36.6 C (!) 36.4 C  SpO2: 95%     Last Pain:  Vitals:   10/17/17 0724  TempSrc:   PainSc: [place]

## 2017-10-17 NOTE — Care Management Obs Status (Signed)
[place] NOTIFICATION   Patient Details  Name: [name] MRN: [id number] Date of Birth: Aug 01, 1970   Medicare Observation Status Notification Given:  Yes    Leeroy Cha, RN 10/17/2017, 2:16 PM

## 2017-10-17 NOTE — Progress Notes (Signed)
Patient ID: [name], female   DOB: April 05, 1970, 47 y.o.   MRN: [id number]   Doing well Abdomen soft. Likes the binder  Plan: discharge

## 2017-10-17 NOTE — Discharge Instructions (Signed)
[facility]  UMBILICAL OR INGUINAL HERNIA REPAIR: POST OP INSTRUCTIONS  Always review your discharge instruction sheet given to you by the facility where your surgery was performed. IF YOU HAVE DISABILITY OR FAMILY LEAVE FORMS, YOU MUST BRING THEM TO THE OFFICE FOR PROCESSING.   DO NOT GIVE THEM TO YOUR DOCTOR.  1. A  prescription for pain medication may be given to you upon discharge.  Take your pain medication as prescribed, if needed.  If narcotic pain medicine is not needed, then you may take acetaminophen (Tylenol) or ibuprofen (Advil) as needed. 2. Take your usually prescribed medications unless otherwise directed. If you need a refill on your pain medication, please contact your pharmacy.  They will contact our office to request authorization. Prescriptions will not be filled after 5 pm or on week-ends. 3. You should follow a light diet the first 24 hours after arrival home, such as soup and crackers, etc.  Be sure to include lots of fluids daily.  Resume your normal diet the day after surgery. 4.Most patients will experience some swelling and bruising around the umbilicus or in the groin and scrotum.  Ice packs and reclining will help.  Swelling and bruising can take several days to resolve.  6. It is common to experience some constipation if taking pain medication after surgery.  Increasing fluid intake and taking a stool softener (such as Colace) will usually help or prevent this problem from occurring.  A mild laxative (Milk of Magnesia or Miralax) should be taken according to package directions if there are no bowel movements after 48 hours. 7. Unless discharge instructions indicate otherwise, you may remove your bandages 24-48 hours after surgery, and you may shower at that time.  You may have steri-strips (small skin tapes) in place directly over the incision.  These strips should be left on the skin for 7-10 days.  If your surgeon used skin glue on the  incision, you may shower in 24 hours.  The glue will flake off over the next 2-3 weeks.  Any sutures or staples will be removed at the office during your follow-up visit. 8. ACTIVITIES:  You may resume regular (light) daily activities beginning the next day--such as daily self-care, walking, climbing stairs--gradually increasing activities as tolerated.  You may have sexual intercourse when it is comfortable.  Refrain from any heavy lifting or straining until approved by your doctor.  a.You may drive when you are no longer taking prescription pain medication, you can comfortably wear a seatbelt, and you can safely maneuver your car and apply brakes. b.RETURN TO WORK:   _____________________________________________  9.You should see your doctor in the office for a follow-up appointment approximately 2-3 weeks after your surgery.  Make sure that you call for this appointment within a day or two after you arrive home to insure a convenient appointment time. 10.OTHER INSTRUCTIONS: __OK TO SHOWER STARTING TOMORROW NO LIFTING MORE THAN 15 POUNDS FOR 4 WEEKS ICE PACK, TYLENOL, IBUPROFEN ALSO FOR PAIN_______________________    _____________________________________  WHEN TO CALL YOUR DOCTOR: 1. Fever over 101.0 2. Inability to urinate 3. Nausea and/or vomiting 4. Extreme swelling or bruising 5. Continued bleeding from incision. 6. Increased pain, redness, or drainage from the incision  The clinic staff is available to answer your questions during regular business hours.  Please dont hesitate to call and ask to speak to one of the nurses for clinical concerns.  If you have a medical emergency, go to the nearest emergency  room or call 911.  A surgeon from [facility] is always on call at the hospital ° ° °[address], [address], [place], [place]  [address] ? ° [address] [address], [place], [place]   [address] °[phone] ? [phone] ? FAX [phone] °Web site:  [email or web address] °

## 2017-10-17 NOTE — Discharge Summary (Signed)
Physician Discharge Summary  Patient ID: [name] MRN: [id number] DOB/AGE: 1971-03-15 47 y.o.  Admit date: 10/16/2017 Discharge date: 10/17/2017  Admission Diagnoses:  Discharge Diagnoses:  Principal Problem:   Umbilical hernia, incarcerated, s/p repair 10/16/2017 Active Problems:   Healing pressure ulcer stage III ([place])   Morbidly obese ([place])   Diabetes mellitus type 2 with complications, uncontrolled ([place])   Sleep apnea   Chronic pain   Discharged Condition: good  Hospital Course: uneventful post op recovery.  Discharged home POD#1  Consults: None  Significant Diagnostic Studies:   Treatments: surgery: umbilical hernia repair with mesh with excision of the umbilicus  Discharge Exam: Blood pressure 110/73, pulse 68, temperature (!) 97.5 F (36.4 C), temperature source Oral, resp. rate 18, height 5\' 6"  (1.676 m), weight 132 kg (291 lb), SpO2 95 %. General appearance: alert, cooperative and no distress Resp: clear to auscultation bilaterally Cardio: regular rate and rhythm, S1, S2 normal, no murmur, click, rub or gallop Incision/Wound: abdomen soft, incision clean  Disposition: Discharge disposition: 01-Home or Self Care       Discharge Instructions    Diet - low sodium heart healthy   Complete by:  As directed    Increase activity slowly   Complete by:  As directed      Allergies as of 10/17/2017      Reactions   Augmentin [amoxicillin-pot Clavulanate] Hives, Other (See Comments)   Has patient had a PCN reaction causing immediate rash, facial/tongue/throat swelling, SOB or lightheadedness with hypotension: No HAS PT DEVELOPED SEVERE RASH INVOLVING MUCUS MEMBRANES or SKIN NECROSIS: #  #  YES  #  # PATIENT HAS HAD A PCN REACTION THAT REQUIRED HOSPITALIZATION:  #  #  YES  #  #  Has patient had a PCN reaction occurring within the last 10 years: No   Penicillins Hives, Other (See Comments)   HAS PT DEVELOPED SEVERE RASH INVOLVING MUCUS MEMBRANES or SKIN  NECROSIS: #  #  YES  #  # PATIENT HAS HAD A PCN REACTION THAT REQUIRED HOSPITALIZATION:  #  #  YES  #  #    Nsaids Other (See Comments)   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   ALPRAZolam 1 MG tablet Commonly known as:  XANAX Take 1 mg by mouth 2 (two) times daily as needed for anxiety.   atorvastatin 20 MG tablet Commonly known as:  LIPITOR Take 20 mg by mouth daily.   Biotin 10000 MCG Tabs Take 10,000 mcg by mouth daily.   carisoprodol 350 MG tablet Commonly known as:  SOMA Take 350 mg by mouth 5 (five) times daily as needed for muscle spasms.   cetirizine 10 MG tablet Commonly known as:  ZYRTEC Take 10 mg by mouth daily as needed for allergies.   DULoxetine 60 MG capsule Commonly known as:  CYMBALTA Take 120 mg by mouth daily.   DULoxetine 60 MG capsule Commonly known as:  CYMBALTA Take 60 mg by mouth daily.   eletriptan 40 MG tablet Commonly known as:  RELPAX Take 40 mg by mouth as needed for migraine or headache. May repeat in 2 hours if headache persists or recurs.   esomeprazole 40 MG capsule Commonly known as:  NEXIUM Take 40 mg by mouth daily before breakfast. Name Brand Only   fentaNYL 75 MCG/HR Commonly known as:  DURAGESIC - dosed mcg/hr Place 75 mcg onto the skin every 3 (three) days.   furosemide 20 MG tablet Commonly known as:  LASIX Take 20 mg by mouth daily as needed for fluid.   gabapentin 600 MG tablet Commonly known as:  NEURONTIN Take 1,200 mg by mouth 3 (three) times daily.   HYDROmorphone 4 MG tablet Commonly known as:  DILAUDID Take 1 tablet (4 mg  total) by mouth every 6 (six) hours as needed for severe pain.   hydrOXYzine 25 MG capsule Commonly known as:  VISTARIL Take 25 mg by mouth 3 (three) times daily as needed for anxiety.   insulin glargine 100 UNIT/ML injection Commonly known as:  LANTUS Inject 44 Units into the skin at bedtime.   levothyroxine 125 MCG tablet Commonly known as:  SYNTHROID, LEVOTHROID Take 125 mcg by mouth daily.   lidocaine 5 % Commonly known as:  LIDODERM Place 1-3 patches onto the skin daily as needed. For knee and back pain   milk thistle 175 MG tablet Take 350 mg by mouth daily.   nystatin powder Generic drug:  nystatin Apply 1 g topically daily as needed (blemishes).   omega-3 acid ethyl esters 1 g capsule Commonly known as:  LOVAZA Take 2 g by mouth daily.   oxyCODONE 15 MG immediate release tablet Commonly known as:  ROXICODONE Take 15 mg by mouth every 6 (six) hours as needed for pain.   phentermine 37.5 MG tablet Commonly known as:  ADIPEX-P Take 37.5 mg by mouth daily before breakfast.   PRENATAL PLUS PO Take 1 tablet by mouth daily.   promethazine 25 MG tablet Commonly known as:  PHENERGAN Take 25 mg by mouth every 6 (six) hours as needed for nausea or vomiting.   senna 8.6 MG Tabs tablet Commonly known as:  SENOKOT Take 8.6 mg by mouth daily as needed for mild constipation.   sitaGLIPtin 50 MG tablet Commonly known as:  JANUVIA Take 50 mg by mouth daily.   SUMAtriptan 100 MG tablet Commonly known as:  IMITREX Take 100 mg by mouth daily as needed for migraine.   topiramate 200 MG tablet Commonly known as:  TOPAMAX Take 200 mg by mouth 2 (two) times daily.   triamcinolone cream 0.1 % Commonly known as:  KENALOG Apply 1 application topically 3 (three) times daily as needed for rash.   VICTOZA 18 MG/3ML Soln injection Generic drug:  Liraglutide Inject 1.8 mg into the skin every morning.   VITAMIN B-12 IJ Inject 1 each as directed every 30 (thirty) days.       Follow-up Information    Coralie Keens, MD. Schedule an appointment as soon as possible for a visit in 3 week(s).   Specialty:  General Surgery Contact information: [address] [place] [address] [phone]           Signed: Harl Bowie 10/17/2017, 9:57 AM

## 2017-10-27 ENCOUNTER — Other Ambulatory Visit: Payer: Self-pay | Admitting: Student

## 2017-10-27 ENCOUNTER — Inpatient Hospital Stay (HOSPITAL_COMMUNITY)
Admission: AD | Admit: 2017-10-27 | Discharge: 2017-11-06 | DRG: 857 | Disposition: A | Discharge status: Home Health | Payer: Medicare HMO | Source: Ambulatory Visit | Attending: Surgery | Admitting: Surgery

## 2017-10-27 DIAGNOSIS — D649 Anemia, unspecified: Secondary | ICD-10-CM | POA: Diagnosis present

## 2017-10-27 DIAGNOSIS — Z8614 Personal history of Methicillin resistant Staphylococcus aureus infection: Secondary | ICD-10-CM

## 2017-10-27 DIAGNOSIS — Z818 Family history of other mental and behavioral disorders: Secondary | ICD-10-CM

## 2017-10-27 DIAGNOSIS — G43909 Migraine, unspecified, not intractable, without status migrainosus: Secondary | ICD-10-CM | POA: Diagnosis present

## 2017-10-27 DIAGNOSIS — M21371 Foot drop, right foot: Secondary | ICD-10-CM | POA: Diagnosis present

## 2017-10-27 DIAGNOSIS — I129 Hypertensive chronic kidney disease with stage 1 through stage 4 chronic kidney disease, or unspecified chronic kidney disease: Secondary | ICD-10-CM | POA: Diagnosis present

## 2017-10-27 DIAGNOSIS — Z6841 Body Mass Index (BMI) 40.0 and over, adult: Secondary | ICD-10-CM

## 2017-10-27 DIAGNOSIS — R011 Cardiac murmur, unspecified: Secondary | ICD-10-CM | POA: Diagnosis present

## 2017-10-27 DIAGNOSIS — Z8249 Family history of ischemic heart disease and other diseases of the circulatory system: Secondary | ICD-10-CM

## 2017-10-27 DIAGNOSIS — Z882 Allergy status to sulfonamides status: Secondary | ICD-10-CM

## 2017-10-27 DIAGNOSIS — E1151 Type 2 diabetes mellitus with diabetic peripheral angiopathy without gangrene: Secondary | ICD-10-CM | POA: Diagnosis present

## 2017-10-27 DIAGNOSIS — Z888 Allergy status to other drugs, medicaments and biological substances status: Secondary | ICD-10-CM

## 2017-10-27 DIAGNOSIS — Z841 Family history of disorders of kidney and ureter: Secondary | ICD-10-CM

## 2017-10-27 DIAGNOSIS — T8141XA Infection following a procedure, superficial incisional surgical site, initial encounter: Principal | ICD-10-CM | POA: Diagnosis present

## 2017-10-27 DIAGNOSIS — T8149XA Infection following a procedure, other surgical site, initial encounter: Secondary | ICD-10-CM

## 2017-10-27 DIAGNOSIS — Z833 Family history of diabetes mellitus: Secondary | ICD-10-CM

## 2017-10-27 DIAGNOSIS — Z886 Allergy status to analgesic agent status: Secondary | ICD-10-CM

## 2017-10-27 DIAGNOSIS — E039 Hypothyroidism, unspecified: Secondary | ICD-10-CM | POA: Diagnosis present

## 2017-10-27 DIAGNOSIS — Z9049 Acquired absence of other specified parts of digestive tract: Secondary | ICD-10-CM

## 2017-10-27 DIAGNOSIS — Z794 Long term (current) use of insulin: Secondary | ICD-10-CM

## 2017-10-27 DIAGNOSIS — Z880 Allergy status to penicillin: Secondary | ICD-10-CM

## 2017-10-27 DIAGNOSIS — M1990 Unspecified osteoarthritis, unspecified site: Secondary | ICD-10-CM | POA: Diagnosis present

## 2017-10-27 DIAGNOSIS — E118 Type 2 diabetes mellitus with unspecified complications: Secondary | ICD-10-CM

## 2017-10-27 DIAGNOSIS — M797 Fibromyalgia: Secondary | ICD-10-CM | POA: Diagnosis present

## 2017-10-27 DIAGNOSIS — Z881 Allergy status to other antibiotic agents status: Secondary | ICD-10-CM

## 2017-10-27 DIAGNOSIS — N189 Chronic kidney disease, unspecified: Secondary | ICD-10-CM | POA: Diagnosis present

## 2017-10-27 DIAGNOSIS — K219 Gastro-esophageal reflux disease without esophagitis: Secondary | ICD-10-CM | POA: Diagnosis present

## 2017-10-27 DIAGNOSIS — E119 Type 2 diabetes mellitus without complications: Secondary | ICD-10-CM | POA: Diagnosis present

## 2017-10-27 DIAGNOSIS — IMO0002 Reserved for concepts with insufficient information to code with codable children: Secondary | ICD-10-CM | POA: Diagnosis present

## 2017-10-27 DIAGNOSIS — L02216 Cutaneous abscess of umbilicus: Secondary | ICD-10-CM | POA: Diagnosis present

## 2017-10-27 DIAGNOSIS — Z8261 Family history of arthritis: Secondary | ICD-10-CM

## 2017-10-27 DIAGNOSIS — E1165 Type 2 diabetes mellitus with hyperglycemia: Secondary | ICD-10-CM | POA: Diagnosis present

## 2017-10-27 DIAGNOSIS — E662 Morbid (severe) obesity with alveolar hypoventilation: Secondary | ICD-10-CM | POA: Diagnosis present

## 2017-10-27 DIAGNOSIS — Z87442 Personal history of urinary calculi: Secondary | ICD-10-CM

## 2017-10-27 DIAGNOSIS — Z79890 Hormone replacement therapy (postmenopausal): Secondary | ICD-10-CM

## 2017-10-27 DIAGNOSIS — F329 Major depressive disorder, single episode, unspecified: Secondary | ICD-10-CM | POA: Diagnosis present

## 2017-10-27 DIAGNOSIS — Z820 Family history of epilepsy and other diseases of the nervous system: Secondary | ICD-10-CM

## 2017-10-27 DIAGNOSIS — F419 Anxiety disorder, unspecified: Secondary | ICD-10-CM | POA: Diagnosis present

## 2017-10-27 DIAGNOSIS — Z809 Family history of malignant neoplasm, unspecified: Secondary | ICD-10-CM

## 2017-10-27 DIAGNOSIS — G894 Chronic pain syndrome: Secondary | ICD-10-CM | POA: Diagnosis present

## 2017-10-27 DIAGNOSIS — Z8041 Family history of malignant neoplasm of ovary: Secondary | ICD-10-CM

## 2017-10-27 HISTORY — DX: Infection following a procedure, other surgical site, initial encounter: T81.49XA

## 2017-10-27 LAB — CBC WITH DIFFERENTIAL/PLATELET
Abs Immature Granulocytes: 0.1 10*3/uL (ref 0.0–0.1)
Basophils Absolute: 0 10*3/uL (ref 0.0–0.1)
Basophils Relative: 0 %
Eosinophils Absolute: 0 10*3/uL (ref 0.0–0.7)
Eosinophils Relative: 0 %
HCT: 37.2 % (ref 36.0–46.0)
Hemoglobin: 12.2 g/dL (ref 12.0–15.0)
Immature Granulocytes: 1 %
Lymphocytes Relative: 29 %
Lymphs Abs: 2.8 10*3/uL (ref 0.7–4.0)
MCH: 29.8 pg (ref 26.0–34.0)
MCHC: 32.8 g/dL (ref 30.0–36.0)
MCV: 91 fL (ref 78.0–100.0)
Monocytes Absolute: 0.8 10*3/uL (ref 0.1–1.0)
Monocytes Relative: 8 %
Neutro Abs: 6 10*3/uL (ref 1.7–7.7)
Neutrophils Relative %: 62 %
Platelets: 267 10*3/uL (ref 150–400)
RBC: 4.09 MIL/uL (ref 3.87–5.11)
RDW: 12.2 % (ref 11.5–15.5)
WBC: 9.6 10*3/uL (ref 4.0–10.5)

## 2017-10-27 LAB — BASIC METABOLIC PANEL
Anion gap: 9 (ref 5–15)
BUN: 12 mg/dL (ref 6–20)
CO2: 21 mmol/L — ABNORMAL LOW (ref 22–32)
Calcium: 9 mg/dL (ref 8.9–10.3)
Chloride: 107 mmol/L (ref 98–111)
Creatinine, Ser: 0.88 mg/dL (ref 0.44–1.00)
GFR calc Af Amer: >60 mL/min (ref >60)
GFR calc non Af Amer: >60 mL/min (ref >60)
Glucose, Bld: 258 mg/dL — ABNORMAL HIGH (ref 70–99)
Potassium: 3.7 mmol/L (ref 3.5–5.1)
Sodium: 137 mmol/L (ref 135–145)

## 2017-10-27 MED ORDER — DIPHENHYDRAMINE HCL 25 MG PO CAPS: 25.0000 mg | ORAL_CAPSULE | Freq: Four times a day (QID) | ORAL | Status: DC | PRN

## 2017-10-27 MED ORDER — SODIUM CHLORIDE 0.9 % IV SOLN
INTRAVENOUS | Status: DC
  Administered 2017-10-27 – 2017-10-29 (×4): via INTRAVENOUS

## 2017-10-27 MED ORDER — IOPAMIDOL (ISOVUE-300) INJECTION 61%
INTRAVENOUS | Status: AC
  Administered 2017-10-27: 22:00:00
  Filled 2017-10-27: qty 30

## 2017-10-27 MED ORDER — ONDANSETRON HCL 4 MG/2ML IJ SOLN
4.0000 mg | Freq: Four times a day (QID) | INTRAMUSCULAR | Status: DC | PRN
Start: 2017-10-27 — End: 2017-11-06
  Administered 2017-10-27 – 2017-10-31 (×3): 4 mg via INTRAVENOUS
  Filled 2017-10-27 (×3): qty 2

## 2017-10-27 MED ORDER — INSULIN ASPART 100 UNIT/ML ~~LOC~~ SOLN
0.0000 [IU] | Freq: Three times a day (TID) | SUBCUTANEOUS | Status: DC
  Administered 2017-10-28: 5 [IU] via SUBCUTANEOUS
  Administered 2017-10-28: 3 [IU] via SUBCUTANEOUS

## 2017-10-27 MED ORDER — MORPHINE SULFATE (PF) 2 MG/ML IV SOLN
2.0000 mg | INTRAVENOUS | Status: DC | PRN
  Administered 2017-10-28: 2 mg via INTRAVENOUS
  Filled 2017-10-27: qty 1

## 2017-10-27 MED ORDER — OXYCODONE HCL 5 MG PO TABS
5.0000 mg | ORAL_TABLET | ORAL | Status: DC | PRN
  Administered 2017-10-28: 10 mg via ORAL
  Administered 2017-10-28: 5 mg via ORAL
  Administered 2017-10-28 – 2017-10-29 (×4): 10 mg via ORAL
  Filled 2017-10-27 (×2): qty 2
  Filled 2017-10-27: qty 1
  Filled 2017-10-27 (×2): qty 2

## 2017-10-27 MED ORDER — CIPROFLOXACIN IN D5W 400 MG/200ML IV SOLN
400.0000 mg | Freq: Two times a day (BID) | INTRAVENOUS | Status: DC
  Administered 2017-10-27 – 2017-10-28 (×2): 400 mg via INTRAVENOUS
  Filled 2017-10-27 (×3): qty 200

## 2017-10-27 MED ORDER — FENTANYL 50 MCG/HR TD PT72
75.0000 ug | MEDICATED_PATCH | TRANSDERMAL | Status: DC
  Administered 2017-10-27 – 2017-11-03 (×2): 75 ug via TRANSDERMAL
  Filled 2017-10-27 (×2): qty 1

## 2017-10-27 MED ORDER — INSULIN ASPART 100 UNIT/ML ~~LOC~~ SOLN
0.0000 [IU] | Freq: Every day | SUBCUTANEOUS | Status: DC
Start: 2017-10-27 — End: 2017-10-29

## 2017-10-27 MED ORDER — METRONIDAZOLE IN NACL 5-0.79 MG/ML-% IV SOLN
500.0000 mg | Freq: Three times a day (TID) | INTRAVENOUS | Status: DC
  Administered 2017-10-27 – 2017-10-28 (×3): 500 mg via INTRAVENOUS
  Filled 2017-10-27 (×4): qty 100

## 2017-10-27 MED ORDER — DIPHENHYDRAMINE HCL 50 MG/ML IJ SOLN: 25.0000 mg | Freq: Four times a day (QID) | INTRAMUSCULAR | Status: DC | PRN

## 2017-10-27 NOTE — H&P (Signed)
[name] Documented: 10/27/2017 5:26 PM Location: [facility] Patient #: [id number] DOB: [birth year] Single / Language: Cleophus Molt / Race: White Female   History of Present Illness  The patient is a 47 year old female presenting for a post-operative visit. She is presenting status post open umbilical hernia repair with mesh by Dr. Ninfa Linden on 10/16/17. Prior to surgery, she was having bleeding and "infection" draining from her umbilicus for about 1 year. A large amount of omentum was in the hernia sac which was reduced. She was seen in urgent office on 10/23/17 and was evaluated by another surgeon and I who evacuated a large hematoma from the inferior aspect of her incision. We placed her on doxycycline, due to mild erythema around the incision.  She states over the past few days, she has developed more pain in the area and is very weak. She has been having difficulty walking short distances. She has a decreased appetite and nausea when eating. Denies vomiting. Denies fever/chills.  She states the area is draining a large amount of foul-smelling material and she can not tolerate the odor, as it makes her more nauseated.   Problem List/Past Medical UMBILICAL HERNIA (U04.5)  POSTOP CHECK (Z09)  POSTOPERATIVE ABSCESS (T81.49XA)   Past Surgical History Breast Biopsy  Left. Foot Surgery  Right. Gallbladder Surgery - Open  Spinal Surgery - Lower Back  Spinal Surgery Midback   Diagnostic Studies History  Colonoscopy  never Mammogram  1-3 years ago Pap Smear  1-5 years ago  Allergies   Penicillins (can tolerate Amoxicillin per patient) Sulfa  Social History  Alcohol use  Occasional alcohol use. No caffeine use  No drug use  Tobacco use  Never smoker.  Family History  Arthritis  Brother, Father, Mother. Cancer  Family Members In General. Depression  Brother, Family Members In General, Father, Mother. Diabetes Mellitus  Brother, Family Members  In General, Father, Mother. Heart Disease  Family Members In General, Father, Mother. Heart disease in female family member before age 58  Heart disease in female family member before age 37  Hypertension  Brother, Family Members In General. Ischemic Bowel Disease  Family Members In General. Kidney Disease  Family Members In General. Migraine Headache  Family Members In General, Mother. Ovarian Cancer  Family Members In General. Seizure disorder  Family Members In General. Thyroid problems  Brother, Mother.  Pregnancy / Birth History Age at menarche  25 years. Contraceptive History  Intrauterine device. Gravida  1 Irregular periods  Para  0  Other Problems  Anxiety Disorder  Arthritis  Back Pain  Chronic Renal Failure Syndrome  Depression  Diabetes Mellitus  Gastroesophageal Reflux Disease  Home Oxygen Use  Hypercholesterolemia  Migraine Headache  Thyroid Disease  Umbilical Hernia Repair     Review of Systems General Present- Weight Gain. Not Present- Appetite Loss, Chills, Fatigue, Fever, Night Sweats and Weight Loss. Skin Present- New Lesions, Non-Healing Wounds and Rash. Not Present- Change in Wart/Mole, Dryness, Hives, Jaundice and Ulcer. HEENT Not Present- Earache, Hearing Loss, Hoarseness, Nose Bleed, Oral Ulcers, Ringing in the Ears, Seasonal Allergies, Sinus Pain, Sore Throat, Visual Disturbances, Wears glasses/contact lenses and Yellow Eyes. Breast Not Present- Breast Mass, Breast Pain, Nipple Discharge and Skin Changes. Cardiovascular Present- Chest Pain. Not Present- Difficulty Breathing Lying Down, Leg Cramps, Palpitations, Rapid Heart Rate, Shortness of Breath and Swelling of Extremities. Musculoskeletal Present- Back Pain, Joint Pain, Muscle Pain and Muscle Weakness. Not Present- Joint Stiffness and Swelling of Extremities. Neurological Present- Headaches,  Numbness, Tingling and Trouble walking. Not Present- Decreased Memory, Fainting,  Seizures, Tremor and Weakness. Psychiatric Present- Anxiety, Change in Sleep Pattern and Frequent crying. Not Present- Bipolar, Depression and Fearful. Endocrine Not Present- Cold Intolerance, Excessive Hunger, Hair Changes, Heat Intolerance, Hot flashes and New Diabetes. Hematology Not Present- Blood Thinners, Easy Bruising, Excessive bleeding, Gland problems, HIV and Persistent Infections.   Physical Exam  GENERAL: Obese female, shaky and teary  EYES: No scleral icterus Pupils equal, lids normal  EXTERNAL EARS: Intact, no masses or lesions EXTERNAL NOSE: Intact, no masses or lesions MOUTH: Lips - no lesions Dentition - normal for age  RESPIRATORY: Normal effort, no use of accessory muscles  MUSCULOSKELETAL: Normal gait Grossly normal ROM upper extremities Grossly normal ROM lower extremities  SKIN: Warm and dry Not diaphoretic  PSYCHIATRIC: Normal judgement and insight Normal mood and affect Alert, oriented x 3  Abdomen The midline incision has surrounding erythema with a small, 0.5 cm opening at the inferior aspect, draining brown, foul-smelling purulent material There is TTP near the incision  The incision was opened up at bedside a few cm in an attempt to evacuate the infection, but no major purulence was found   Assessment & Plan  POSTOPERATIVE ABSCESS (T81.49XA) Impression: She is presenting to urgent office 1.5 weeks status post umbilical hernia repair with mesh by Dr. Ninfa Linden. She was evaluated a few days ago in the office and was diagnosed with a post-op hematoma, however it now appears that the hematoma is infected. Dr. Donne Hazel also evaluated the patient and believes she would benefit from admission to the hospital with IV antibiotics, a CT scan of abd/pelvis, blood work, and exploration in the operating room tomorrow.  She was told to stay NPO after midnight and provided verbal understanding. Dr. Donne Hazel will notify the [place] physician.  Signed  electronically by Kellie Shropshire, PA C (10/27/2017 5:34 PM)

## 2017-10-28 ENCOUNTER — Other Ambulatory Visit: Payer: Self-pay

## 2017-10-28 ENCOUNTER — Observation Stay (HOSPITAL_COMMUNITY): Payer: Medicare HMO | Admitting: Anesthesiology

## 2017-10-28 ENCOUNTER — Encounter (HOSPITAL_COMMUNITY): Admission: AD | Disposition: A | Discharge status: Home Health | Payer: Self-pay | Source: Ambulatory Visit

## 2017-10-28 ENCOUNTER — Encounter (HOSPITAL_COMMUNITY): Payer: Self-pay | Admitting: *Deleted

## 2017-10-28 ENCOUNTER — Observation Stay (HOSPITAL_COMMUNITY): Payer: Medicare HMO

## 2017-10-28 DIAGNOSIS — Z841 Family history of disorders of kidney and ureter: Secondary | ICD-10-CM | POA: Diagnosis not present

## 2017-10-28 DIAGNOSIS — Z818 Family history of other mental and behavioral disorders: Secondary | ICD-10-CM | POA: Diagnosis not present

## 2017-10-28 DIAGNOSIS — E1151 Type 2 diabetes mellitus with diabetic peripheral angiopathy without gangrene: Secondary | ICD-10-CM | POA: Diagnosis present

## 2017-10-28 DIAGNOSIS — G894 Chronic pain syndrome: Secondary | ICD-10-CM | POA: Diagnosis present

## 2017-10-28 DIAGNOSIS — M797 Fibromyalgia: Secondary | ICD-10-CM | POA: Diagnosis present

## 2017-10-28 DIAGNOSIS — Z8249 Family history of ischemic heart disease and other diseases of the circulatory system: Secondary | ICD-10-CM | POA: Diagnosis not present

## 2017-10-28 DIAGNOSIS — Z886 Allergy status to analgesic agent status: Secondary | ICD-10-CM | POA: Diagnosis not present

## 2017-10-28 DIAGNOSIS — Z8041 Family history of malignant neoplasm of ovary: Secondary | ICD-10-CM | POA: Diagnosis not present

## 2017-10-28 DIAGNOSIS — Z882 Allergy status to sulfonamides status: Secondary | ICD-10-CM | POA: Diagnosis not present

## 2017-10-28 DIAGNOSIS — N189 Chronic kidney disease, unspecified: Secondary | ICD-10-CM | POA: Diagnosis present

## 2017-10-28 DIAGNOSIS — E039 Hypothyroidism, unspecified: Secondary | ICD-10-CM | POA: Diagnosis present

## 2017-10-28 DIAGNOSIS — Z6841 Body Mass Index (BMI) 40.0 and over, adult: Secondary | ICD-10-CM | POA: Diagnosis not present

## 2017-10-28 DIAGNOSIS — E1165 Type 2 diabetes mellitus with hyperglycemia: Secondary | ICD-10-CM | POA: Diagnosis not present

## 2017-10-28 DIAGNOSIS — K219 Gastro-esophageal reflux disease without esophagitis: Secondary | ICD-10-CM | POA: Diagnosis present

## 2017-10-28 DIAGNOSIS — E662 Morbid (severe) obesity with alveolar hypoventilation: Secondary | ICD-10-CM | POA: Diagnosis present

## 2017-10-28 DIAGNOSIS — Z8261 Family history of arthritis: Secondary | ICD-10-CM | POA: Diagnosis not present

## 2017-10-28 DIAGNOSIS — T8149XA Infection following a procedure, other surgical site, initial encounter: Secondary | ICD-10-CM | POA: Diagnosis present

## 2017-10-28 DIAGNOSIS — E118 Type 2 diabetes mellitus with unspecified complications: Secondary | ICD-10-CM | POA: Diagnosis not present

## 2017-10-28 DIAGNOSIS — Z809 Family history of malignant neoplasm, unspecified: Secondary | ICD-10-CM | POA: Diagnosis not present

## 2017-10-28 DIAGNOSIS — L02216 Cutaneous abscess of umbilicus: Secondary | ICD-10-CM | POA: Diagnosis present

## 2017-10-28 DIAGNOSIS — Z9049 Acquired absence of other specified parts of digestive tract: Secondary | ICD-10-CM | POA: Diagnosis not present

## 2017-10-28 DIAGNOSIS — T8141XA Infection following a procedure, superficial incisional surgical site, initial encounter: Secondary | ICD-10-CM | POA: Diagnosis present

## 2017-10-28 DIAGNOSIS — I129 Hypertensive chronic kidney disease with stage 1 through stage 4 chronic kidney disease, or unspecified chronic kidney disease: Secondary | ICD-10-CM | POA: Diagnosis present

## 2017-10-28 DIAGNOSIS — Z881 Allergy status to other antibiotic agents status: Secondary | ICD-10-CM | POA: Diagnosis not present

## 2017-10-28 DIAGNOSIS — Z820 Family history of epilepsy and other diseases of the nervous system: Secondary | ICD-10-CM | POA: Diagnosis not present

## 2017-10-28 DIAGNOSIS — Z880 Allergy status to penicillin: Secondary | ICD-10-CM | POA: Diagnosis not present

## 2017-10-28 HISTORY — PX: INCISION AND DRAINAGE ABSCESS: SHX5864

## 2017-10-28 LAB — GLUCOSE, CAPILLARY
Glucose-Capillary: 111 mg/dL — ABNORMAL HIGH (ref 70–99)
Glucose-Capillary: 131 mg/dL — ABNORMAL HIGH (ref 70–99)
Glucose-Capillary: 161 mg/dL — ABNORMAL HIGH (ref 70–99)
Glucose-Capillary: 114 mg/dL — ABNORMAL HIGH (ref 70–99)
Glucose-Capillary: 236 mg/dL — ABNORMAL HIGH (ref 70–99)
Glucose-Capillary: 416 mg/dL — ABNORMAL HIGH (ref 70–99)
Glucose-Capillary: 482 mg/dL — ABNORMAL HIGH (ref 70–99)

## 2017-10-28 LAB — HEMOGLOBIN A1C
Hgb A1c MFr Bld: 8.7 % — ABNORMAL HIGH (ref 4.8–5.6)
Mean Plasma Glucose: 202.99 mg/dL

## 2017-10-28 IMAGING — CT CT ABD-PELV W/ CM
3 of 6 series · 16 of 46 positions shown, 18 images · IV contrast (Omni 300)
Comparison: None.

CLINICAL DATA: Umbilical hernia repair on [DATE] with mesh
after long standing wound. Diabetic patient was seen last week and
that of a hematoma and placed on antibiotics. Currently the patient
is tachycardic with foul-smelling drainage from lower post portion
of the incision.

EXAM:
CT ABDOMEN AND PELVIS WITH CONTRAST
TECHNIQUE: Multidetector CT imaging of the abdomen and pelvis was performed
using the standard protocol following bolus administration of
intravenous contrast.
CONTRAST:  100mL OMNIPAQUE IOHEXOL 300 MG/ML  SOLN

[Series 4: lung · axial · 0.98mm/px · z∈[+447,+492]mm · 2 of 29 slices shown]
[im 10/29  bone]
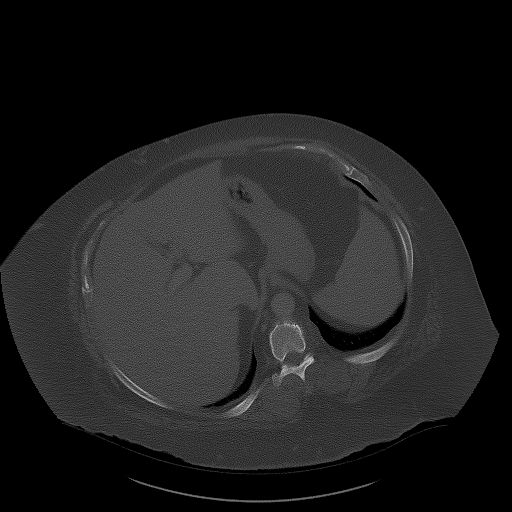
[im 19/29  bone]
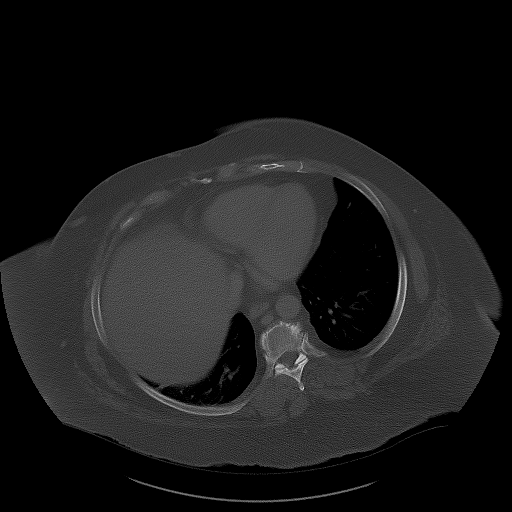

[Series 6: a/p w/ cor · coronal · 1.03mm/px · 3 of 181 slices shown]
[im 61/181  soft-tissue]
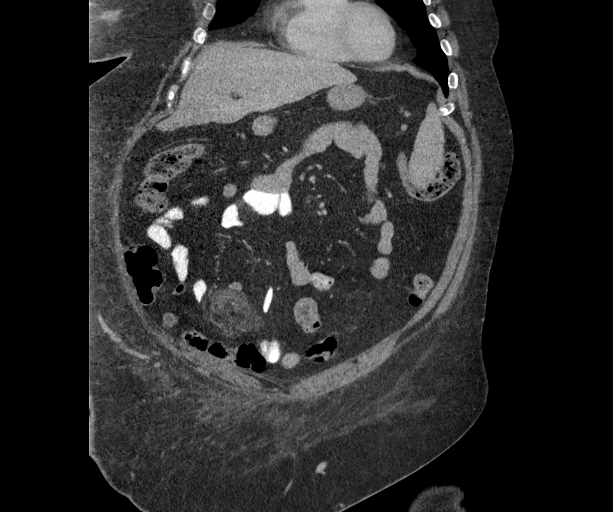
[im 81/181  soft-tissue]
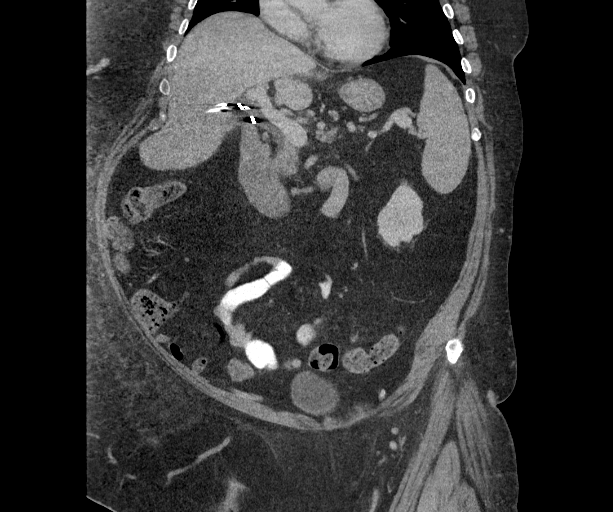
[im 101/181  soft-tissue]
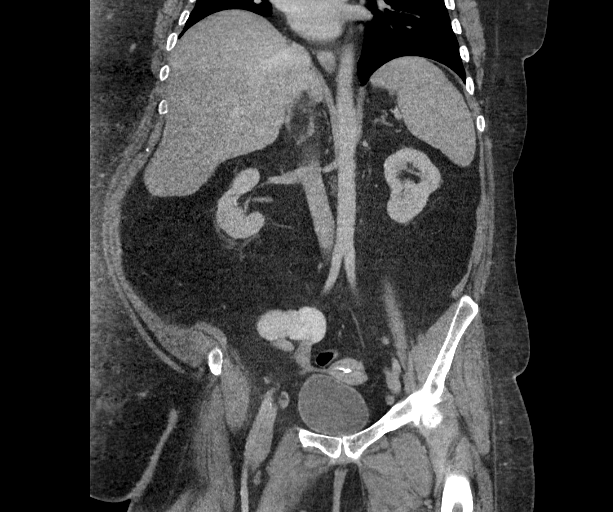

[Series 8: a/p w/ 5mm · axial · 1.38mm/px · z∈[+62,+497]mm · 11 of 105 slices shown, 13 images]
[im 9/105  soft-tissue]
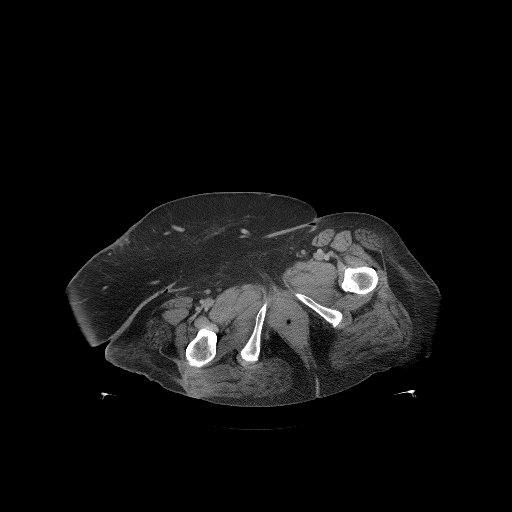
[im 9/105  bone]
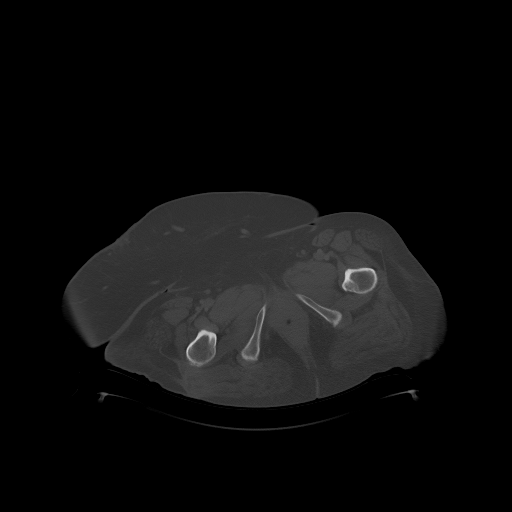
[im 18/105  soft-tissue]
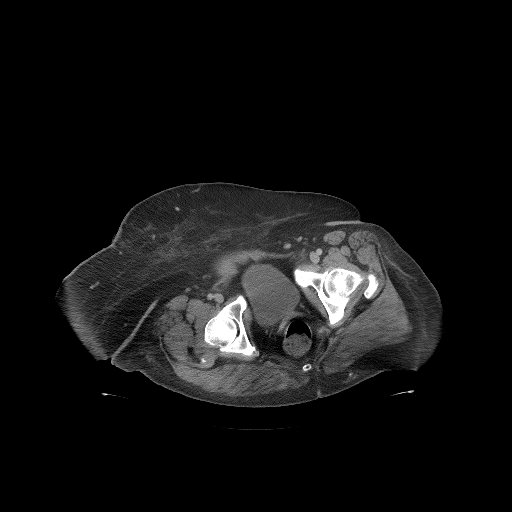
[im 27/105  soft-tissue]
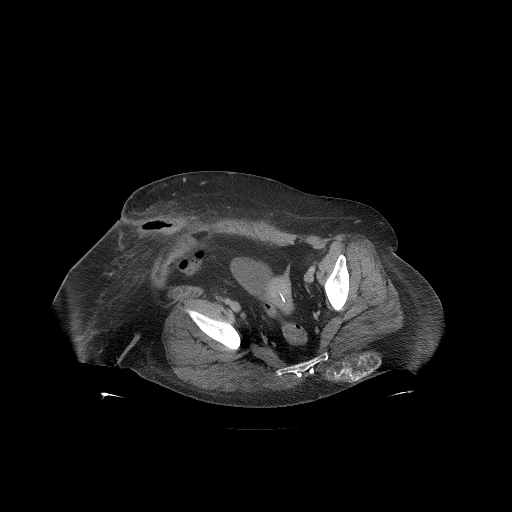
[im 35/105  soft-tissue]
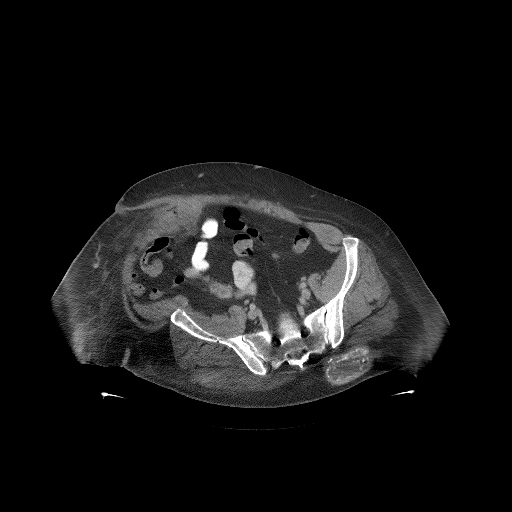
[im 44/105  soft-tissue]
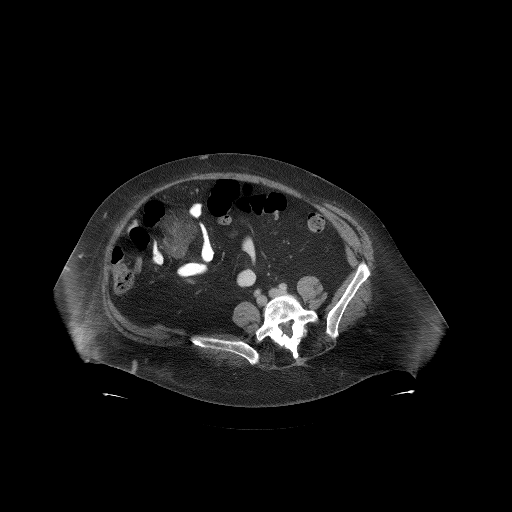
[im 53/105  soft-tissue]
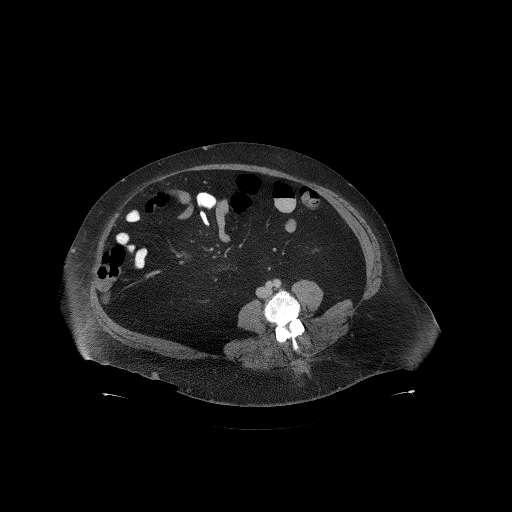
[im 61/105  soft-tissue]
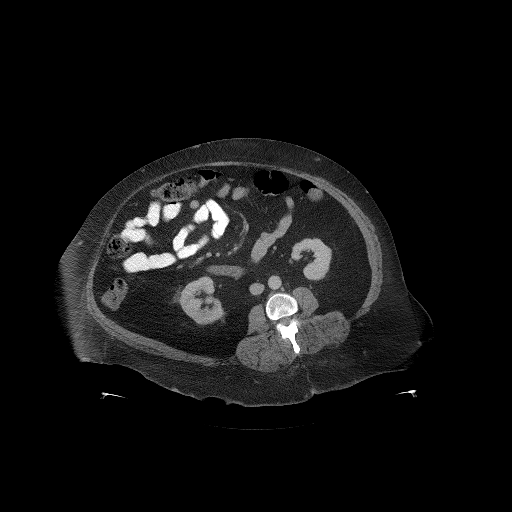
[im 70/105  soft-tissue]
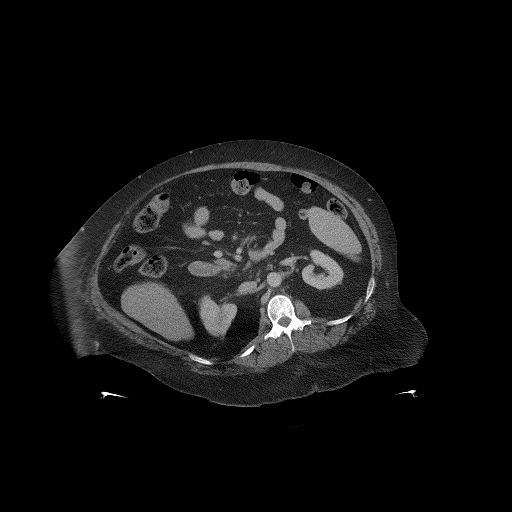
[im 79/105  soft-tissue]
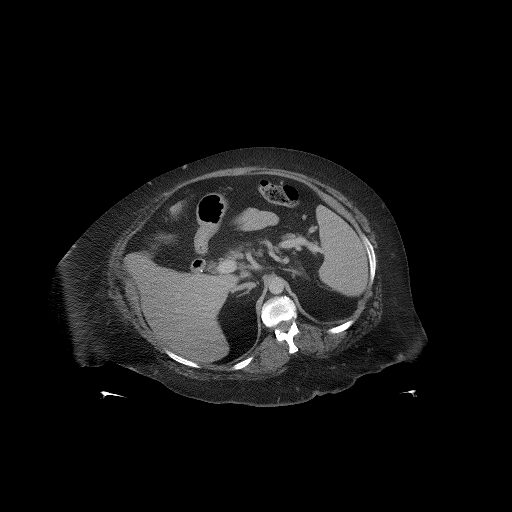
[im 79/105  bone]
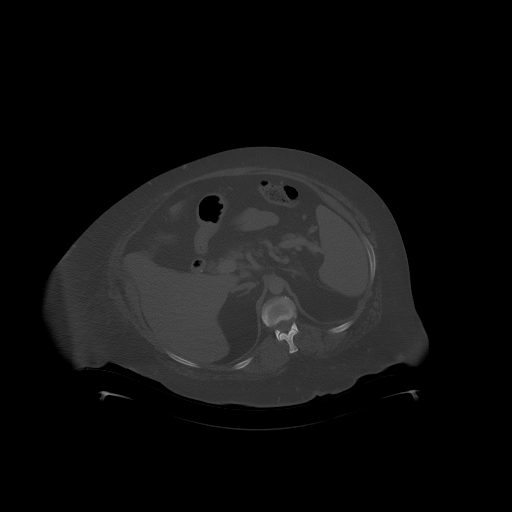
[im 87/105  soft-tissue]
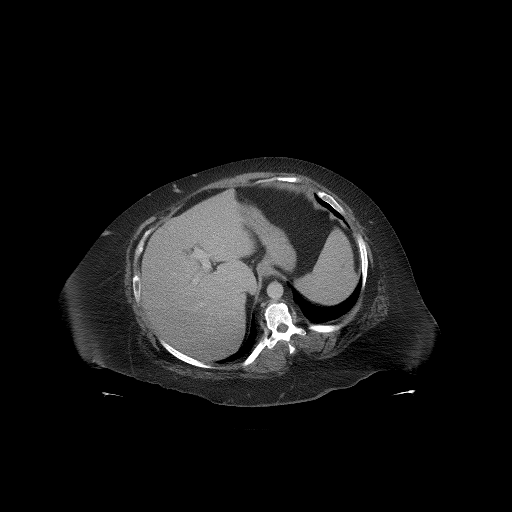
[im 96/105  soft-tissue]
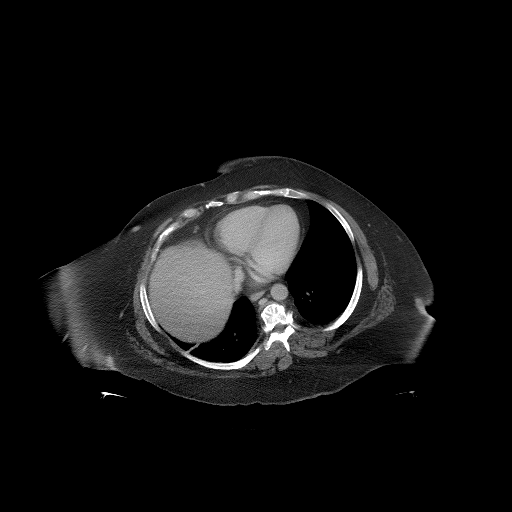

[16 of 46 positions shown; findings below may reference images not displayed]

FINDINGS: LOWER CHEST: The included heart is top-normal in size without
pericardial effusion. Subsegmental atelectasis is seen in the right
lower lobe. No effusion or pulmonary consolidation.

HEPATOBILIARY: Homogeneous attenuation of the liver without
space-occupying mass. Mild steatosis of the liver is noted. The
patient is status post cholecystectomy. There is no biliary
dilatation.

PANCREAS: Normal

SPLEEN: Normal

ADRENALS/URINARY TRACT: Normal bilateral adrenal glands. Symmetric
cortical enhancement of both kidneys. No enhancing mass lesions of
either kidney. No obstructive uropathy. The urinary bladder is
normal.

STOMACH/BOWEL: The stomach, small and large bowel are normal in
course and caliber without inflammatory changes. Normal appendix.

VASCULAR/LYMPHATIC: Aortoiliac vessels are normal in course and
caliber. No lymphadenopathy by CT size criteria.

REPRODUCTIVE: Normal.

OTHER: No drainable fluid collection or abscess is noted. No
communication with underlying bowel is seen at site of umbilical
hernia repair. Inflammatory phlegmonous tissue is noted surrounding
the umbilical soft tissue wound as well overlying the peritoneal
surface. Induration of the mesenteric fat deep to the umbilicus is
also noted likely representing necrosis of fat measures
approximately 5.9 x 5.8 5.2 cm.

MUSCULOSKELETAL: Calcified subcutaneous fatty mass is noted
consistent with fat necrosis measuring 7.8 x 3.8 x 9.8 cm involving
the right gluteal subcutaneous fat.
IMPRESSION: 1. Status post umbilical hernia repair without recurrence of hernia
noted.
2. Inflammatory thickening at site of previous umbilical fat
containing hernia is noted without drainable fluid collection nor
abscess. No enterocutaneous fistula is seen.
3. Mesenteric fat necrosis deep to the site of umbilical hernia
repair.
4. Left gluteal fat necrosis is also noted in the subcutaneous soft
tissues.
5. Hepatic steatosis is identified.

## 2017-10-28 SURGERY — INCISION AND DRAINAGE, ABSCESS
Anesthesia: General | Site: Abdomen

## 2017-10-28 MED ORDER — ALPRAZOLAM 0.25 MG PO TABS
ORAL_TABLET | ORAL | Status: AC
  Filled 2017-10-28: qty 4

## 2017-10-28 MED ORDER — MIDAZOLAM HCL 2 MG/2ML IJ SOLN
INTRAMUSCULAR | Status: AC
  Filled 2017-10-28: qty 2

## 2017-10-28 MED ORDER — INSULIN ASPART 100 UNIT/ML ~~LOC~~ SOLN
10.0000 [IU] | Freq: Once | SUBCUTANEOUS | Status: AC
  Administered 2017-10-28: 10 [IU] via SUBCUTANEOUS

## 2017-10-28 MED ORDER — GABAPENTIN 600 MG PO TABS
1200.0000 mg | ORAL_TABLET | Freq: Three times a day (TID) | ORAL | Status: DC
  Administered 2017-10-28 – 2017-11-06 (×28): 1200 mg via ORAL
  Filled 2017-10-28 (×28): qty 2

## 2017-10-28 MED ORDER — PROPOFOL 10 MG/ML IV BOLUS
INTRAVENOUS | Status: AC
  Filled 2017-10-28: qty 20

## 2017-10-28 MED ORDER — PANTOPRAZOLE SODIUM 40 MG PO TBEC
80.0000 mg | DELAYED_RELEASE_TABLET | Freq: Every day | ORAL | Status: DC
  Administered 2017-10-28 – 2017-11-06 (×10): 80 mg via ORAL
  Filled 2017-10-28 (×10): qty 2

## 2017-10-28 MED ORDER — OXYCODONE HCL 5 MG PO TABS
ORAL_TABLET | ORAL | Status: AC
  Filled 2017-10-28: qty 2

## 2017-10-28 MED ORDER — PROPOFOL 10 MG/ML IV BOLUS
INTRAVENOUS | Status: DC | PRN
  Administered 2017-10-28: 200 mg via INTRAVENOUS

## 2017-10-28 MED ORDER — ENOXAPARIN SODIUM 40 MG/0.4ML ~~LOC~~ SOLN: 40.0000 mg | SUBCUTANEOUS | Status: DC

## 2017-10-28 MED ORDER — CLINDAMYCIN PHOSPHATE 600 MG/50ML IV SOLN
600.0000 mg | Freq: Four times a day (QID) | INTRAVENOUS | Status: DC
  Administered 2017-10-28 – 2017-10-29 (×3): 600 mg via INTRAVENOUS
  Filled 2017-10-28 (×5): qty 50

## 2017-10-28 MED ORDER — DEXAMETHASONE SODIUM PHOSPHATE 10 MG/ML IJ SOLN
INTRAMUSCULAR | Status: AC
  Filled 2017-10-28: qty 1

## 2017-10-28 MED ORDER — HYDROXYZINE HCL 25 MG PO TABS: 25.0000 mg | ORAL_TABLET | Freq: Three times a day (TID) | ORAL | Status: DC | PRN

## 2017-10-28 MED ORDER — VANCOMYCIN HCL 10 G IV SOLR
1500.0000 mg | INTRAVENOUS | Status: AC
  Administered 2017-10-28: 1500 mg via INTRAVENOUS
  Filled 2017-10-28: qty 1500

## 2017-10-28 MED ORDER — ROCURONIUM BROMIDE 10 MG/ML (PF) SYRINGE
PREFILLED_SYRINGE | INTRAVENOUS | Status: DC | PRN
  Administered 2017-10-28: 50 mg via INTRAVENOUS

## 2017-10-28 MED ORDER — ONDANSETRON HCL 4 MG/2ML IJ SOLN
INTRAMUSCULAR | Status: AC
  Filled 2017-10-28: qty 2

## 2017-10-28 MED ORDER — HYDROMORPHONE HCL 1 MG/ML IJ SOLN
0.2500 mg | INTRAMUSCULAR | Status: DC | PRN
  Administered 2017-10-28 (×4): 0.5 mg via INTRAVENOUS

## 2017-10-28 MED ORDER — FENTANYL CITRATE (PF) 250 MCG/5ML IJ SOLN
INTRAMUSCULAR | Status: AC
  Filled 2017-10-28: qty 5

## 2017-10-28 MED ORDER — PHENYLEPHRINE HCL 10 MG/ML IJ SOLN
INTRAMUSCULAR | Status: DC | PRN
  Administered 2017-10-28: 20 ug/min via INTRAVENOUS

## 2017-10-28 MED ORDER — MIDAZOLAM HCL 5 MG/5ML IJ SOLN
INTRAMUSCULAR | Status: DC | PRN
  Administered 2017-10-28 (×2): 1 mg via INTRAVENOUS

## 2017-10-28 MED ORDER — ROCURONIUM BROMIDE 10 MG/ML (PF) SYRINGE
PREFILLED_SYRINGE | INTRAVENOUS | Status: AC
  Filled 2017-10-28: qty 10

## 2017-10-28 MED ORDER — LEVOTHYROXINE SODIUM 125 MCG PO TABS
125.0000 ug | ORAL_TABLET | Freq: Every day | ORAL | Status: DC
  Administered 2017-10-29 – 2017-11-06 (×9): 125 ug via ORAL
  Filled 2017-10-28 (×9): qty 1

## 2017-10-28 MED ORDER — DULOXETINE HCL 60 MG PO CPEP
120.0000 mg | ORAL_CAPSULE | Freq: Every day | ORAL | Status: DC
  Administered 2017-10-28 – 2017-11-06 (×10): 120 mg via ORAL
  Filled 2017-10-28 (×10): qty 2

## 2017-10-28 MED ORDER — HYDROMORPHONE HCL 1 MG/ML IJ SOLN
INTRAMUSCULAR | Status: AC
  Filled 2017-10-28: qty 1

## 2017-10-28 MED ORDER — FENTANYL CITRATE (PF) 250 MCG/5ML IJ SOLN
INTRAMUSCULAR | Status: DC | PRN
  Administered 2017-10-28: 100 ug via INTRAVENOUS
  Administered 2017-10-28 (×3): 50 ug via INTRAVENOUS

## 2017-10-28 MED ORDER — IOHEXOL 300 MG/ML  SOLN
100.0000 mL | Freq: Once | INTRAMUSCULAR | Status: AC | PRN
  Administered 2017-10-28: 100 mL via INTRAVENOUS

## 2017-10-28 MED ORDER — LIDOCAINE 2% (20 MG/ML) 5 ML SYRINGE
INTRAMUSCULAR | Status: DC | PRN
  Administered 2017-10-28: 100 mg via INTRAVENOUS

## 2017-10-28 MED ORDER — HYDROXYZINE PAMOATE 25 MG PO CAPS
25.0000 mg | ORAL_CAPSULE | Freq: Three times a day (TID) | ORAL | Status: DC | PRN
  Filled 2017-10-28: qty 1

## 2017-10-28 MED ORDER — LACTATED RINGERS IV SOLN
INTRAVENOUS | Status: DC
  Administered 2017-10-28: 14:00:00 via INTRAVENOUS

## 2017-10-28 MED ORDER — VANCOMYCIN HCL 10 G IV SOLR
1250.0000 mg | Freq: Two times a day (BID) | INTRAVENOUS | Status: DC
  Administered 2017-10-29 – 2017-10-30 (×3): 1250 mg via INTRAVENOUS
  Filled 2017-10-28 (×3): qty 1250

## 2017-10-28 MED ORDER — ALPRAZOLAM 1 MG PO TABS
1.0000 mg | ORAL_TABLET | Freq: Two times a day (BID) | ORAL | Status: DC | PRN
  Administered 2017-10-28: 1 mg via ORAL

## 2017-10-28 MED ORDER — ONDANSETRON HCL 4 MG/2ML IJ SOLN
INTRAMUSCULAR | Status: DC | PRN
  Administered 2017-10-28: 4 mg via INTRAVENOUS

## 2017-10-28 MED ORDER — ALPRAZOLAM 0.5 MG PO TABS: 1.0000 mg | ORAL_TABLET | Freq: Two times a day (BID) | ORAL | Status: DC | PRN

## 2017-10-28 MED ORDER — HYDROMORPHONE HCL 1 MG/ML IJ SOLN
INTRAMUSCULAR | Status: DC | PRN
  Administered 2017-10-28: 1 mg via INTRAVENOUS

## 2017-10-28 MED ORDER — ATORVASTATIN CALCIUM 20 MG PO TABS
20.0000 mg | ORAL_TABLET | Freq: Every day | ORAL | Status: DC
  Administered 2017-10-28 – 2017-11-06 (×10): 20 mg via ORAL
  Filled 2017-10-28 (×10): qty 1

## 2017-10-28 MED ORDER — SUGAMMADEX SODIUM 200 MG/2ML IV SOLN
INTRAVENOUS | Status: DC | PRN
  Administered 2017-10-28: 264 mg via INTRAVENOUS

## 2017-10-28 MED ORDER — LIDOCAINE 2% (20 MG/ML) 5 ML SYRINGE
INTRAMUSCULAR | Status: AC
  Filled 2017-10-28: qty 5

## 2017-10-28 MED ORDER — SUGAMMADEX SODIUM 500 MG/5ML IV SOLN
INTRAVENOUS | Status: AC
  Filled 2017-10-28: qty 5

## 2017-10-28 MED ORDER — ENOXAPARIN SODIUM 80 MG/0.8ML ~~LOC~~ SOLN
65.0000 mg | SUBCUTANEOUS | Status: DC
  Administered 2017-10-29 – 2017-11-05 (×8): 65 mg via SUBCUTANEOUS
  Filled 2017-10-28 (×8): qty 0.8

## 2017-10-28 MED ORDER — 0.9 % SODIUM CHLORIDE (POUR BTL) OPTIME
TOPICAL | Status: DC | PRN
  Administered 2017-10-28: 1000 mL

## 2017-10-28 MED ORDER — SENNA 8.6 MG PO TABS: 8.6000 mg | ORAL_TABLET | Freq: Every day | ORAL | Status: DC | PRN

## 2017-10-28 MED ORDER — ACETAMINOPHEN 500 MG PO TABS
1000.0000 mg | ORAL_TABLET | Freq: Two times a day (BID) | ORAL | Status: DC | PRN
  Administered 2017-11-01 – 2017-11-05 (×6): 1000 mg via ORAL
  Filled 2017-10-28 (×8): qty 2

## 2017-10-28 MED ORDER — DEXAMETHASONE SODIUM PHOSPHATE 10 MG/ML IJ SOLN
INTRAMUSCULAR | Status: DC | PRN
  Administered 2017-10-28: 4 mg via INTRAVENOUS

## 2017-10-28 MED ORDER — DAKINS (1/4 STRENGTH) 0.125 % EX SOLN
CUTANEOUS | Status: DC | PRN
  Administered 2017-10-28: 1

## 2017-10-28 SURGICAL SUPPLY — 25 items
BNDG GAUZE ELAST 4 BULKY (GAUZE/BANDAGES/DRESSINGS) IMPLANT
CANISTER SUCT 3000ML PPV (MISCELLANEOUS) ×2 IMPLANT
COVER SURGICAL LIGHT HANDLE (MISCELLANEOUS) ×2 IMPLANT
DRAPE LAPAROSCOPIC ABDOMINAL (DRAPES) IMPLANT
DRSG PAD ABDOMINAL 8X10 ST (GAUZE/BANDAGES/DRESSINGS) IMPLANT
ELECT CAUTERY BLADE 6.4 (BLADE) ×2 IMPLANT
ELECT REM PT RETURN 9FT ADLT (ELECTROSURGICAL) ×2
ELECTRODE REM PT RTRN 9FT ADLT (ELECTROSURGICAL) ×1 IMPLANT
GAUZE SPONGE 4X4 12PLY STRL (GAUZE/BANDAGES/DRESSINGS) IMPLANT
GLOVE BIO SURGEON STRL SZ7.5 (GLOVE) ×2 IMPLANT
GLOVE BIOGEL PI IND STRL 8 (GLOVE) ×1 IMPLANT
GLOVE BIOGEL PI INDICATOR 8 (GLOVE) ×1
GOWN STRL REUS W/ TWL LRG LVL3 (GOWN DISPOSABLE) ×1 IMPLANT
GOWN STRL REUS W/TWL LRG LVL3 (GOWN DISPOSABLE) ×2
HANDPIECE INTERPULSE COAX TIP (DISPOSABLE)
KIT BASIN OR (CUSTOM PROCEDURE TRAY) ×2 IMPLANT
KIT TURNOVER KIT B (KITS) ×2 IMPLANT
NS IRRIG 1000ML POUR BTL (IV SOLUTION) ×2 IMPLANT
PACK GENERAL/GYN (CUSTOM PROCEDURE TRAY) ×2 IMPLANT
PAD ARMBOARD 7.5X6 YLW CONV (MISCELLANEOUS) ×2 IMPLANT
SET HNDPC FAN SPRY TIP SCT (DISPOSABLE) IMPLANT
SWAB COLLECTION DEVICE MRSA (MISCELLANEOUS) IMPLANT
SWAB CULTURE ESWAB REG 1ML (MISCELLANEOUS) IMPLANT
TOWEL OR 17X24 6PK STRL BLUE (TOWEL DISPOSABLE) ×2 IMPLANT
TOWEL OR 17X26 10 PK STRL BLUE (TOWEL DISPOSABLE) ×2 IMPLANT

## 2017-10-28 NOTE — Anesthesia Postprocedure Evaluation (Signed)
Anesthesia Post Note  Patient: [name]  Procedure(s) Performed: INCISION AND DRAINAGE UMBILICAL HERNIA ABSCESS (N/A Abdomen)     Patient location during evaluation: PACU Anesthesia Type: General Level of consciousness: awake Pain management: pain level controlled Vital Signs Assessment: post-procedure vital signs reviewed and stable Respiratory status: spontaneous breathing Cardiovascular status: stable Postop Assessment: no headache Anesthetic complications: no    Last Vitals:  Vitals:   10/28/17 1616 10/28/17 1631  BP: 108/69 110/60  Pulse: 76 73  Resp: 14 14  Temp:    SpO2: 92% 93%    Last Pain:  Vitals:   10/28/17 1615  TempSrc:   PainSc: Asleep                 Yoona Ishii

## 2017-10-28 NOTE — Progress Notes (Signed)
Subjective/Chief Complaint: Pt with no c/o this AM    Objective: Vital signs in last 24 hours: Temp:  [99.1 F (37.3 C)] 99.1 F (37.3 C) (08/05 1805) Pulse Rate:  [82] 82 (08/05 1805) Resp:  [20] 20 (08/05 1805) BP: (120)/(84) 120/84 (08/05 1805) SpO2:  [99 %] 99 % (08/05 1805) Last BM Date: 10/25/17  Intake/Output from previous day: 08/05 0701 - 08/06 0700 In: 750 [I.V.:400; IV Piggyback:350] Out: -  Intake/Output this shift: No intake/output data recorded.  Constitutional: No acute distress, conversant, appears states age. Eyes: Anicteric sclerae, moist conjunctiva, no lid lag Lungs: Clear to auscultation bilaterally, normal respiratory effort CV: regular rate and rhythm, no murmurs, no peripheral edema, pedal pulses 2+ GI: Soft, no masses or hepatosplenomegaly, non-tender to palpation, open wound, draining brown thick pus Skin: No rashes, palpation reveals normal turgor Psychiatric: appropriate judgment and insight, oriented to person, place, and time   Lab Results:  Recent Labs    10/27/17 1846  WBC 9.6  HGB 12.2  HCT 37.2  PLT 267   BMET Recent Labs    10/27/17 1846  NA 137  K 3.7  CL 107  CO2 21*  GLUCOSE 258*  BUN 12  CREATININE 0.88  CALCIUM 9.0   PT/INR No results for input(s): LABPROT, INR in the last 72 hours. ABG No results for input(s): PHART, HCO3 in the last 72 hours.  Invalid input(s): PCO2, PO2  Studies/Results: Ct Abdomen Pelvis W Contrast  Result Date: 10/28/2017 CLINICAL DATA:  Umbilical hernia repair on 10/16/2017 with mesh after long standing wound. Diabetic patient was seen last week and that of a hematoma and placed on antibiotics. Currently the patient is tachycardic with foul-smelling drainage from lower post portion of the incision. EXAM: CT ABDOMEN AND PELVIS WITH CONTRAST TECHNIQUE: Multidetector CT imaging of the abdomen and pelvis was performed using the standard protocol following bolus administration of intravenous  contrast. CONTRAST:  171mL OMNIPAQUE IOHEXOL 300 MG/ML  SOLN COMPARISON:  None. FINDINGS: LOWER CHEST: The included heart is top-normal in size without pericardial effusion. Subsegmental atelectasis is seen in the right lower lobe. No effusion or pulmonary consolidation. HEPATOBILIARY: Homogeneous attenuation of the liver without space-occupying mass. Mild steatosis of the liver is noted. The patient is status post cholecystectomy. There is no biliary dilatation. PANCREAS: Normal SPLEEN: Normal ADRENALS/URINARY TRACT: Normal bilateral adrenal glands. Symmetric cortical enhancement of both kidneys. No enhancing mass lesions of either kidney. No obstructive uropathy. The urinary bladder is normal. STOMACH/BOWEL: The stomach, small and large bowel are normal in course and caliber without inflammatory changes. Normal appendix. VASCULAR/LYMPHATIC: Aortoiliac vessels are normal in course and caliber. No lymphadenopathy by CT size criteria. REPRODUCTIVE: Normal. OTHER: No drainable fluid collection or abscess is noted. No communication with underlying bowel is seen at site of umbilical hernia repair. Inflammatory phlegmonous tissue is noted surrounding the umbilical soft tissue wound as well overlying the peritoneal surface. Induration of the mesenteric fat deep to the umbilicus is also noted likely representing necrosis of fat measures approximately 5.9 x 5.8 5.2 cm. MUSCULOSKELETAL: Calcified subcutaneous fatty mass is noted consistent with fat necrosis measuring 7.8 x 3.8 x 9.8 cm involving the right gluteal subcutaneous fat. IMPRESSION: 1. Status post umbilical hernia repair without recurrence of hernia noted. 2. Inflammatory thickening at site of previous umbilical fat containing hernia is noted without drainable fluid collection nor abscess. No enterocutaneous fistula is seen. 3. Mesenteric fat necrosis deep to the site of umbilical hernia repair. 4. Left gluteal  fat necrosis is also noted in the subcutaneous soft  tissues. 5. Hepatic steatosis is identified. Electronically Signed   By: Ashley Royalty M.D.   On: 10/28/2017 02:41    Anti-infectives: Anti-infectives (From admission, onward)   Start     Dose/Rate Route Frequency Ordered Stop   10/27/17 1830  ciprofloxacin (CIPRO) IVPB 400 mg     400 mg 200 mL/hr over 60 Minutes Intravenous Every 12 hours 10/27/17 1817     10/27/17 1830  metroNIDAZOLE (FLAGYL) IVPB 500 mg     500 mg 100 mL/hr over 60 Minutes Intravenous Every 8 hours 10/27/17 1817        Assessment/Plan: 41 F s/p open UHR w/ Mesh  Post op abscess Past Medical History:  Diagnosis Date  . Acute renal failure (ARF) ([place]) 09/02/2012  . Anemia   . Anxiety    panic attacks  . Back pain   . Blood transfusion   . Chronic heel ulcer ([place])   . Chronic kidney disease   . Chronic pain syndrome   . Depression   . Diabetes mellitus    type II   . DJD (degenerative joint disease)   . Dysrhythmia    pt unsure what this was  . Fibromyalgia   . GERD (gastroesophageal reflux disease)   . H/O: Bell's palsy 2011  . Heart murmur    "slight one"  . History of kidney stones   . History of MRSA infection OF ULCER  . Hypertension    no longer taken since 10-11 months per patient at preop phone call of 10/13/2017   . Hypothyroidism   . Hypoventilation associated with obesity syndrome ([place])   . Migraine   . Non-healing non-surgical wound    Right hip, has Wound vac to hip.  Started as a skin tear.  . Peripheral vascular disease ([place])   . PONV (postoperative nausea and vomiting)    can be slow to wake up after surgery  . Right foot drop   . Shortness of breath    with Activity  . Sleep apnea    "study shows not bad enough for CPAP.", pt denies  . Umbilical hernia   . URI (upper respiratory infection) 03/05/2011   1. To OR for exploration of wound and D&I, possible mesh explantation 2. I d/w her the risks and benefits of the procedure to include but not limited to: infection , bleeding,  damage to surrounding structures, possible need for surgery.  The patient voiced understanding and wishes toproceed.   LOS: 0 days    Rosario Jacks., [facility] 10/28/2017

## 2017-10-28 NOTE — Plan of Care (Signed)
  Problem: Nutrition: Goal: Adequate nutrition will be maintained Outcome: Progressing   Problem: Pain Managment: Goal: General experience of comfort will improve Outcome: Progressing   

## 2017-10-28 NOTE — Progress Notes (Signed)
Pharmacy Antibiotic Note  [name] is a 47 y.o. female admitted on 10/27/2017 with abdominal wall abscess. She is  Pharmacy has been consulted for Vancomycin dosing s/p I&D umbilical hernia abscess.  Received preop Flagyl and Cipro IV today.   Noted upon chart review that in Nov-Dec 2012 this patient was receiving 1000 mg IV q12h with therapeutic vancomycin trough: SCr was wnl at the time. Prior Vanc trough was too high on higher dose of 1500mg  IV q12h (SCr was 1.3 and weight at this time was higher at 204kg) thus dose was then decreased to 1000mg  IV q12h 11/30 thru 03/05/2011 with vanc trough = 15.6 on 02/26/2011.   Plan: Vancomycin 1500mg  IV x1 then 1250 mg IV q12h Will check vancomycin trough at steady state.  F/u renal function, clinical status, cultures.   Height: 5\' 6"  (167.6 cm) IBW/kg (Calculated) : 59.3  Temp (24hrs), Avg:98.6 F (37 C), Min:98 F (36.7 C), Max:99.1 F (37.3 C)  Recent Labs  Lab 10/27/17 1846  WBC 9.6  CREATININE 0.88    Estimated Creatinine Clearance: 107.9 mL/min (by C-G formula based on SCr of 0.88 mg/dL).    Allergies  Allergen Reactions  . Augmentin [Amoxicillin-Pot Clavulanate] Hives and Other (See Comments)    Has patient had a PCN reaction causing immediate rash, facial/tongue/throat swelling, SOB or lightheadedness with hypotension: No HAS PT DEVELOPED SEVERE RASH INVOLVING MUCUS MEMBRANES or SKIN NECROSIS: #  #  YES  #  # PATIENT HAS HAD A PCN REACTION THAT REQUIRED HOSPITALIZATION:  #  #  YES  #  #  Has patient had a PCN reaction occurring within the last 10 years: No   . Penicillins Hives and Other (See Comments)    HAS PT DEVELOPED SEVERE RASH INVOLVING MUCUS MEMBRANES or SKIN NECROSIS: #  #  YES  #  # PATIENT HAS HAD A PCN REACTION THAT REQUIRED HOSPITALIZATION:  #  #  YES  #  #   . 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 Sulfa Antibiotics Hives  . Versed [Midazolam] Nausea And Vomiting    Pt had medication on October 16 2017 with no issues   Thank you for allowing pharmacy to be a part of this patient's care.  Nicole Cella, RPh Clinical Pharmacist Pager: [phone] Please check AMION for all [place] phone numbers After 10:00 PM, call [place] [phone]  10/28/2017  6:27 PM

## 2017-10-28 NOTE — Transfer of Care (Signed)
Immediate Anesthesia Transfer of Care Note  Patient: [name]  Procedure(s) Performed: INCISION AND DRAINAGE UMBILICAL HERNIA ABSCESS (N/A Abdomen)  Patient Location: PACU  Anesthesia Type:General  Level of Consciousness: awake, alert , oriented and patient cooperative  Airway & Oxygen Therapy: Patient Spontanous Breathing and Patient connected to nasal cannula oxygen  Post-op Assessment: Report given to RN and Post -op Vital signs reviewed and stable  Post vital signs: Reviewed and stable  Last Vitals:  Vitals Value Taken Time  BP 120/56 10/28/2017  3:01 PM  Temp    Pulse 82 10/28/2017  3:02 PM  Resp 16 10/28/2017  3:02 PM  SpO2 99 % 10/28/2017  3:02 PM  Vitals shown include unvalidated device data.  Last Pain:  Vitals:   10/28/17 1021  TempSrc:   PainSc: 0-No pain         Complications: No apparent anesthesia complications

## 2017-10-28 NOTE — Op Note (Signed)
10/28/2017  2:48 PM  PATIENT:  [name]  47 y.o. female  PRE-OPERATIVE DIAGNOSIS:  Abdominal wall abscess  POST-OPERATIVE DIAGNOSIS:  same  PROCEDURE:  Procedure(s): INCISION AND DRAINAGE UMBILICAL ABSCESS (N/A)  SURGEON:  Surgeon(s) and Role:    Ralene Ok, MD - Primary  ANESTHESIA:   local and general  EBL:  minimal   BLOOD ADMINISTERED:none  DRAINS: 1/4 strength Dakins soaked kerlix   LOCAL MEDICATIONS USED:  NONE  SPECIMEN:  Source of Specimen:  Abdominal abscess  DISPOSITION OF SPECIMEN:  micro  COUNTS:  YES  TOURNIQUET:  * No tourniquets in log *  DICTATION: .Dragon Dictation  Indication procedure: Patient is a 47 year old female with a previous history of an open umbilical hernia repair with mesh.  Patient subsequently had a subcutaneous hematoma.  Subsequent to this patient had developed abdominal pain, drainage from her incision site and it was thought to be purulence.  [place] this patient was admitted and scheduled for expiration of the wound.  Details of procedure: After the patient was consented she was taken to the operating room placed in supine position with bilateral SCDs in place.  The patient was prepped in standard fashion.  Timeout was called all facts verified.  At this time the incision was sharply incised from the area of the open incision site.  This was taken down to the subcutaneous tissue.  There was an abscess cavity just superior to the fascia.  Subcutaneous tissue was open throughout this entire area.  There was large amount of purulence still within the base of the abscess cavity.  Cultures were taken from this area.  At this time the area was irrigated out with sterile saline.  There was some necrotic adipose tissue.  Using a curette this was debrided.  At the end of debridement there is a healthy, fat, bleeding tissue.  At this time quarter strength Dakin's solution was used to soaked Kerlix.  This was packed into the wound.   The wound was then dressed with ABD pad and tape.  The patient taught the procedure was taken to the recovery in stable condition.   PLAN OF CARE: Admit for overnight observation  PATIENT DISPOSITION:  PACU - hemodynamically stable.   Delay start of Pharmacological VTE agent (>24hrs) due to surgical blood loss or risk of bleeding: no

## 2017-10-28 NOTE — Anesthesia Procedure Notes (Signed)
Procedure Name: Intubation Date/Time: 10/28/2017 2:05 PM Performed by: Renato Shin, CRNA Pre-anesthesia Checklist: Patient identified, Emergency Drugs available, Suction available and Patient being monitored Patient Re-evaluated:Patient Re-evaluated prior to induction Oxygen Delivery Method: Circle system utilized Preoxygenation: Pre-oxygenation with 100% oxygen Induction Type: IV induction Ventilation: Mask ventilation without difficulty Laryngoscope Size: Miller and 2 Grade View: Grade I Tube type: Oral Tube size: 7.0 mm Number of attempts: 1 Airway Equipment and Method: Stylet Placement Confirmation: ETT inserted through vocal cords under direct vision,  positive ETCO2,  CO2 detector and breath sounds checked- equal and bilateral Secured at: 21 cm Tube secured with: Tape Dental Injury: Teeth and Oropharynx as per pre-operative assessment

## 2017-10-28 NOTE — Anesthesia Preprocedure Evaluation (Signed)
Anesthesia Evaluation  Patient identified by MRN, date of birth, ID band Patient awake    Reviewed: Allergy & Precautions, NPO status , Patient's Chart, lab work & pertinent test results  History of Anesthesia Complications (+) PONV  Airway Mallampati: II  TM Distance: >3 FB     Dental   Pulmonary sleep apnea ,    breath sounds clear to auscultation       Cardiovascular hypertension, + Peripheral Vascular Disease  + dysrhythmias + Valvular Problems/Murmurs  Rhythm:Regular Rate:Normal     Neuro/Psych  Headaches,    GI/Hepatic GERD  ,  Endo/Other  diabetesHypothyroidism   Renal/GU Renal disease     Musculoskeletal   Abdominal   Peds  Hematology  (+) anemia ,   Anesthesia Other Findings   Reproductive/Obstetrics                             Anesthesia Physical Anesthesia Plan  ASA: III  Anesthesia Plan: General   Post-op Pain Management:    Induction: Intravenous  PONV Risk Score and Plan: Treatment may vary due to age or medical condition  Airway Management Planned: Oral ETT  Additional Equipment:   Intra-op Plan:   Post-operative Plan: Extubation in OR  Informed Consent:   Dental advisory given  Plan Discussed with: CRNA and Anesthesiologist  Anesthesia Plan Comments:         Anesthesia Quick Evaluation

## 2017-10-29 ENCOUNTER — Encounter (HOSPITAL_COMMUNITY): Payer: Self-pay | Admitting: General Surgery

## 2017-10-29 LAB — GLUCOSE, CAPILLARY
Glucose-Capillary: 385 mg/dL — ABNORMAL HIGH (ref 70–99)
Glucose-Capillary: 242 mg/dL — ABNORMAL HIGH (ref 70–99)
Glucose-Capillary: 245 mg/dL — ABNORMAL HIGH (ref 70–99)
Glucose-Capillary: 288 mg/dL — ABNORMAL HIGH (ref 70–99)
Glucose-Capillary: 207 mg/dL — ABNORMAL HIGH (ref 70–99)

## 2017-10-29 LAB — GLUCOSE, RANDOM: Glucose, Bld: 455 mg/dL — ABNORMAL HIGH (ref 70–99)

## 2017-10-29 MED ORDER — INSULIN ASPART 100 UNIT/ML ~~LOC~~ SOLN
0.0000 [IU] | Freq: Three times a day (TID) | SUBCUTANEOUS | Status: DC
  Administered 2017-10-29 (×2): 7 [IU] via SUBCUTANEOUS
  Administered 2017-10-29 – 2017-10-30 (×3): 11 [IU] via SUBCUTANEOUS
  Administered 2017-10-30: 15 [IU] via SUBCUTANEOUS
  Administered 2017-10-31 (×2): 11 [IU] via SUBCUTANEOUS
  Administered 2017-10-31: 20 [IU] via SUBCUTANEOUS
  Administered 2017-11-01 – 2017-11-02 (×4): 11 [IU] via SUBCUTANEOUS
  Administered 2017-11-02: 15 [IU] via SUBCUTANEOUS
  Administered 2017-11-02: 20 [IU] via SUBCUTANEOUS
  Administered 2017-11-03 (×2): 15 [IU] via SUBCUTANEOUS
  Administered 2017-11-03: 4 [IU] via SUBCUTANEOUS
  Administered 2017-11-04: 20 [IU] via SUBCUTANEOUS
  Administered 2017-11-04 – 2017-11-05 (×3): 7 [IU] via SUBCUTANEOUS
  Administered 2017-11-05: 11 [IU] via SUBCUTANEOUS
  Administered 2017-11-05 – 2017-11-06 (×3): 7 [IU] via SUBCUTANEOUS
  Administered 2017-11-06: 11 [IU] via SUBCUTANEOUS

## 2017-10-29 MED ORDER — HYDROMORPHONE HCL 1 MG/ML IJ SOLN: 1.0000 mg | INTRAMUSCULAR | Status: DC | PRN

## 2017-10-29 MED ORDER — HYDROMORPHONE HCL 1 MG/ML IJ SOLN
1.0000 mg | INTRAMUSCULAR | Status: DC | PRN
  Administered 2017-10-29: 1 mg via INTRAVENOUS
  Administered 2017-10-29 – 2017-10-30 (×2): 2 mg via INTRAVENOUS
  Filled 2017-10-29 (×3): qty 2

## 2017-10-29 MED ORDER — LIDOCAINE 5 % EX PTCH
1.0000 | MEDICATED_PATCH | CUTANEOUS | Status: DC
  Administered 2017-10-29: 1 via TRANSDERMAL
  Filled 2017-10-29: qty 1

## 2017-10-29 MED ORDER — METHOCARBAMOL 500 MG PO TABS
1000.0000 mg | ORAL_TABLET | Freq: Three times a day (TID) | ORAL | Status: DC
  Administered 2017-10-29 – 2017-11-06 (×26): 1000 mg via ORAL
  Filled 2017-10-29 (×26): qty 2

## 2017-10-29 MED ORDER — INSULIN ASPART 100 UNIT/ML ~~LOC~~ SOLN
20.0000 [IU] | Freq: Once | SUBCUTANEOUS | Status: AC
  Administered 2017-10-29: 20 [IU] via SUBCUTANEOUS

## 2017-10-29 MED ORDER — OXYCODONE HCL 5 MG PO TABS
5.0000 mg | ORAL_TABLET | Freq: Four times a day (QID) | ORAL | Status: DC | PRN
  Administered 2017-10-29 – 2017-11-06 (×24): 15 mg via ORAL
  Filled 2017-10-29 (×26): qty 3

## 2017-10-29 MED ORDER — CLINDAMYCIN PHOSPHATE 600 MG/50ML IV SOLN
600.0000 mg | Freq: Three times a day (TID) | INTRAVENOUS | Status: DC
  Administered 2017-10-29 – 2017-10-30 (×3): 600 mg via INTRAVENOUS
  Filled 2017-10-29 (×4): qty 50

## 2017-10-29 MED ORDER — LIDOCAINE 5 % EX PTCH
2.0000 | MEDICATED_PATCH | CUTANEOUS | Status: DC
  Administered 2017-10-30 – 2017-11-06 (×8): 2 via TRANSDERMAL
  Filled 2017-10-29 (×10): qty 2

## 2017-10-29 MED ORDER — INSULIN GLARGINE 100 UNIT/ML ~~LOC~~ SOLN
30.0000 [IU] | Freq: Every day | SUBCUTANEOUS | Status: DC
  Administered 2017-10-29 – 2017-10-30 (×2): 30 [IU] via SUBCUTANEOUS
  Filled 2017-10-29 (×2): qty 0.3

## 2017-10-29 MED ORDER — INSULIN ASPART 100 UNIT/ML ~~LOC~~ SOLN
0.0000 [IU] | Freq: Every day | SUBCUTANEOUS | Status: DC
  Administered 2017-10-29: 2 [IU] via SUBCUTANEOUS
  Administered 2017-10-31: 3 [IU] via SUBCUTANEOUS
  Administered 2017-11-01 – 2017-11-04 (×2): 2 [IU] via SUBCUTANEOUS

## 2017-10-29 NOTE — Progress Notes (Signed)
CBG rechecked- 482; Dr. Kae Heller paged and orders given.

## 2017-10-29 NOTE — Progress Notes (Signed)
[facility] Progress Note  1 Day Post-Op  Subjective: CC: abdominal pain Pain over incision. Pt requests morphine be changed to dilaudid because morphine gives her a headache. Tolerating diet and having some bowel function, denies nausea. Tolerated dressing change well.   Objective: Vital signs in last 24 hours: Temp:  [97.3 F (36.3 C)-98.7 F (37.1 C)] 97.8 F (36.6 C) (08/07 0440) Pulse Rate:  [73-84] 83 (08/07 0440) Resp:  [13-20] 16 (08/07 0440) BP: (94-131)/(40-77) 109/64 (08/07 0440) SpO2:  [92 %-100 %] 100 % (08/07 0440) Last BM Date: 10/26/17  Intake/Output from previous day: 08/06 0701 - 08/07 0700 In: 1865.3 [I.V.:1465.3; IV Piggyback:400] Out: 75 [Blood:75] Intake/Output this shift: No intake/output data recorded.  PE: Gen:  Alert, NAD, pleasant Card:  Regular rate and rhythm Pulm:  Normal effort Abd: Soft, non-tender, non-distended, bowel sounds present, wound as below with good granulation 7 cm x 3.5 cm x 9cm   Skin: warm and dry, no rashes  Psych: A&Ox3   Lab Results:  Recent Labs    10/27/17 1846  WBC 9.6  HGB 12.2  HCT 37.2  PLT 267   BMET Recent Labs    10/27/17 1846 10/28/17 2225  NA 137  --   K 3.7  --   CL 107  --   CO2 21*  --   GLUCOSE 258* 455*  BUN 12  --   CREATININE 0.88  --   CALCIUM 9.0  --    PT/INR No results for input(s): LABPROT, INR in the last 72 hours. CMP     Component Value Date/Time   NA 137 10/27/2017 1846   K 3.7 10/27/2017 1846   CL 107 10/27/2017 1846   CO2 21 (L) 10/27/2017 1846   GLUCOSE 455 (H) 10/28/2017 2225   BUN 12 10/27/2017 1846   CREATININE 0.88 10/27/2017 1846   CALCIUM 9.0 10/27/2017 1846   PROT 7.2 08/19/2013 1420   ALBUMIN 3.7 08/19/2013 1420   AST 25 08/19/2013 1420   ALT 25 08/19/2013 1420   ALKPHOS 86 08/19/2013 1420   BILITOT 0.3 08/19/2013 1420   GFRNONAA >60 10/27/2017 1846   GFRAA >60 10/27/2017 1846   Lipase     Component Value Date/Time   LIPASE 21  06/11/2009 1550       Studies/Results: Ct Abdomen Pelvis W Contrast  Result Date: 10/28/2017 CLINICAL DATA:  Umbilical hernia repair on 10/16/2017 with mesh after long standing wound. Diabetic patient was seen last week and that of a hematoma and placed on antibiotics. Currently the patient is tachycardic with foul-smelling drainage from lower post portion of the incision. EXAM: CT ABDOMEN AND PELVIS WITH CONTRAST TECHNIQUE: Multidetector CT imaging of the abdomen and pelvis was performed using the standard protocol following bolus administration of intravenous contrast. CONTRAST:  144mL OMNIPAQUE IOHEXOL 300 MG/ML  SOLN COMPARISON:  None. FINDINGS: LOWER CHEST: The included heart is top-normal in size without pericardial effusion. Subsegmental atelectasis is seen in the right lower lobe. No effusion or pulmonary consolidation. HEPATOBILIARY: Homogeneous attenuation of the liver without space-occupying mass. Mild steatosis of the liver is noted. The patient is status post cholecystectomy. There is no biliary dilatation. PANCREAS: Normal SPLEEN: Normal ADRENALS/URINARY TRACT: Normal bilateral adrenal glands. Symmetric cortical enhancement of both kidneys. No enhancing mass lesions of either kidney. No obstructive uropathy. The urinary bladder is normal. STOMACH/BOWEL: The stomach, small and large bowel are normal in course and caliber without inflammatory changes. Normal appendix. VASCULAR/LYMPHATIC: Aortoiliac vessels are normal in course  and caliber. No lymphadenopathy by CT size criteria. REPRODUCTIVE: Normal. OTHER: No drainable fluid collection or abscess is noted. No communication with underlying bowel is seen at site of umbilical hernia repair. Inflammatory phlegmonous tissue is noted surrounding the umbilical soft tissue wound as well overlying the peritoneal surface. Induration of the mesenteric fat deep to the umbilicus is also noted likely representing necrosis of fat measures approximately 5.9 x  5.8 5.2 cm. MUSCULOSKELETAL: Calcified subcutaneous fatty mass is noted consistent with fat necrosis measuring 7.8 x 3.8 x 9.8 cm involving the right gluteal subcutaneous fat. IMPRESSION: 1. Status post umbilical hernia repair without recurrence of hernia noted. 2. Inflammatory thickening at site of previous umbilical fat containing hernia is noted without drainable fluid collection nor abscess. No enterocutaneous fistula is seen. 3. Mesenteric fat necrosis deep to the site of umbilical hernia repair. 4. Left gluteal fat necrosis is also noted in the subcutaneous soft tissues. 5. Hepatic steatosis is identified. Electronically Signed   By: Ashley Royalty M.D.   On: 10/28/2017 02:41    Anti-infectives: Anti-infectives (From admission, onward)   Start     Dose/Rate Route Frequency Ordered Stop   10/29/17 0800  vancomycin (VANCOCIN) 1,250 mg in sodium chloride 0.9 % 250 mL IVPB     1,250 mg 166.7 mL/hr over 90 Minutes Intravenous Every 12 hours 10/28/17 1829     10/28/17 1930  vancomycin (VANCOCIN) 1,500 mg in sodium chloride 0.9 % 500 mL IVPB     1,500 mg 250 mL/hr over 120 Minutes Intravenous NOW 10/28/17 1644 10/28/17 2153   10/28/17 1800  clindamycin (CLEOCIN) IVPB 600 mg     600 mg 100 mL/hr over 30 Minutes Intravenous Every 6 hours 10/28/17 1644     10/27/17 1830  ciprofloxacin (CIPRO) IVPB 400 mg  Status:  Discontinued     400 mg 200 mL/hr over 60 Minutes Intravenous Every 12 hours 10/27/17 1817 10/28/17 1644   10/27/17 1830  metroNIDAZOLE (FLAGYL) IVPB 500 mg  Status:  Discontinued     500 mg 100 mL/hr over 60 Minutes Intravenous Every 8 hours 10/27/17 1817 10/28/17 1644       Assessment/Plan Hypothyroidism - home meds HTN T2DM - appreciate diabetes coordinator, start 30 units lantus daily  Fibromyalgia/chronic pain - changed oxy ir to home dose, d/c morphine, added IV dilaudid GERD CKD, unknown stage - Cr 0.88 yesterday  Depression Peripheral vascular disease Obesity   S/p  umbilical hernia repair with mesh 10/16/17 Dr. Ninfa Linden Post-operative abscess S/p incision and drainage of umbilical abscess 05/24/47 Dr. Rosendo Gros - POD#1 - cxs pending - continue BS abx - repeat CBC and BMET tomorrow AM - Dressing changes with Dakin's solution BID - will discuss with MD whether VAC would be an option once wound is cleaned up a little  FEN: CM diet, IVF VTE: SCDs, lovenox ID: cipro/flagyl 8/5; vanc/clindamycin 8/6>> Follow up: Dr. Ninfa Linden  LOS: 1 day    Brigid Re , [facility] Surgery 10/29/2017, 9:39 AM Pager: [phone] Consults: [phone] Mon-Fri 7:00 am-4:30 pm Sat-Sun 7:00 am-11:30 am

## 2017-10-29 NOTE — Progress Notes (Signed)
CBG 416; Dr. Kae Heller paged and orders given; placed order for stat Glucose.

## 2017-10-29 NOTE — Progress Notes (Addendum)
Inpatient Diabetes Program Recommendations  AACE/ADA: New Consensus Statement on Inpatient Glycemic Control (2015)  Target Ranges:  Prepandial:   less than 140 mg/dL      Peak postprandial:   less than 180 mg/dL (1-2 hours)      Critically ill patients:  140 - 180 mg/dL   Results for CHRISTINNA, SPRUNG (MRN [id number]) as of 10/29/2017 08:42  Ref. Range 10/28/2017 06:42 10/28/2017 08:02 10/28/2017 12:28 10/28/2017 17:24 10/28/2017 21:44 10/28/2017 23:29  Glucose-Capillary Latest Ref Range: 70 - 99 mg/dL 131 (H) 161 (H) 114 (H)  4 mg Decadron at 2pm 236 (H)  5 units NOVOLOG  416 (H)  10 units NOVOLOG  482 (H)  20 units NOVOLOG    Results for ALISSANDRA, GEOFFROY (MRN [id number]) as of 10/29/2017 08:42  Ref. Range 10/29/2017 02:34 10/29/2017 07:48  Glucose-Capillary Latest Ref Range: 70 - 99 mg/dL 385 (H) 242 (H)  7 units NOVOLOG     Admit: Abdominal wall abscess Post Hernia Repair  History: DM  Home DM Meds: Lantus 44 units QHS        Januvia 50 mg daily       Victoza 1.8 mg daily  Current Orders: Novolog Resistant Correction Scale/ SSI (0-20 units) TID AC + HS        MD- Please consider starting Lantus 30 units daily (70% total home dose)  Please start this AM    Addendum 9:35am- Called up to unit [place].  Spoke with Brigid Re, PA with Surgical Team.  Discussed above insulin recommendations.  Permission given to me to place orders for Lantus 30 units daily start this AM.  Orders entered.     --Will follow patient during hospitalization--  Wyn Quaker RN, MSN, CDE Diabetes Coordinator Inpatient Glycemic Control Team Team Pager: [phone] (8a-5p)

## 2017-10-29 NOTE — Plan of Care (Signed)
  Problem: Education: Goal: Knowledge of General Education information will improve Description Including pain rating scale, medication(s)/side effects and non-pharmacologic comfort measures Outcome: Progressing Note:  POC reviewed with pt.   

## 2017-10-30 LAB — GLUCOSE, CAPILLARY
Glucose-Capillary: 349 mg/dL — ABNORMAL HIGH (ref 70–99)
Glucose-Capillary: 284 mg/dL — ABNORMAL HIGH (ref 70–99)
Glucose-Capillary: 259 mg/dL — ABNORMAL HIGH (ref 70–99)
Glucose-Capillary: 172 mg/dL — ABNORMAL HIGH (ref 70–99)

## 2017-10-30 LAB — BASIC METABOLIC PANEL
Anion gap: 7 (ref 5–15)
BUN: 13 mg/dL (ref 6–20)
CO2: 23 mmol/L (ref 22–32)
Calcium: 8.4 mg/dL — ABNORMAL LOW (ref 8.9–10.3)
Chloride: 108 mmol/L (ref 98–111)
Creatinine, Ser: 0.99 mg/dL (ref 0.44–1.00)
GFR calc Af Amer: >60 mL/min (ref >60)
GFR calc non Af Amer: >60 mL/min (ref >60)
Glucose, Bld: 371 mg/dL — ABNORMAL HIGH (ref 70–99)
Potassium: 3.9 mmol/L (ref 3.5–5.1)
Sodium: 138 mmol/L (ref 135–145)

## 2017-10-30 LAB — CBC
HCT: 34.4 % — ABNORMAL LOW (ref 36.0–46.0)
Hemoglobin: 11 g/dL — ABNORMAL LOW (ref 12.0–15.0)
MCH: 30 pg (ref 26.0–34.0)
MCHC: 32 g/dL (ref 30.0–36.0)
MCV: 93.7 fL (ref 78.0–100.0)
Platelets: 244 10*3/uL (ref 150–400)
RBC: 3.67 MIL/uL — ABNORMAL LOW (ref 3.87–5.11)
RDW: 12.4 % (ref 11.5–15.5)
WBC: 7.6 10*3/uL (ref 4.0–10.5)

## 2017-10-30 MED ORDER — INSULIN ASPART 100 UNIT/ML ~~LOC~~ SOLN
4.0000 [IU] | Freq: Three times a day (TID) | SUBCUTANEOUS | Status: DC
  Administered 2017-10-30 – 2017-11-01 (×6): 4 [IU] via SUBCUTANEOUS

## 2017-10-30 MED ORDER — INSULIN GLARGINE 100 UNIT/ML ~~LOC~~ SOLN: 36.0000 [IU] | Freq: Every day | SUBCUTANEOUS | Status: DC

## 2017-10-30 MED ORDER — DEXTROSE IN LACTATED RINGERS 5 % IV SOLN
INTRAVENOUS | Status: DC
  Administered 2017-10-30: 22:00:00 via INTRAVENOUS

## 2017-10-30 MED ORDER — DOXYCYCLINE HYCLATE 100 MG PO TABS
100.0000 mg | ORAL_TABLET | Freq: Two times a day (BID) | ORAL | Status: DC
  Administered 2017-10-30 – 2017-10-31 (×4): 100 mg via ORAL
  Filled 2017-10-30 (×4): qty 1

## 2017-10-30 MED ORDER — HYDROMORPHONE HCL 1 MG/ML IJ SOLN
1.0000 mg | INTRAMUSCULAR | Status: DC | PRN
  Administered 2017-10-31 – 2017-11-03 (×10): 1 mg via INTRAVENOUS
  Filled 2017-10-30 (×10): qty 1

## 2017-10-30 MED ORDER — LACTATED RINGERS IV BOLUS
1000.0000 mL | Freq: Once | INTRAVENOUS | Status: AC
  Administered 2017-10-30: 1000 mL via INTRAVENOUS

## 2017-10-30 NOTE — Progress Notes (Signed)
Patient complaining of dizziness specially when trying to get OOB, orthostatic Bp taken and noted that BP drop significantly when getting up with dizziness. Md made aware , new orders noted. Patient updated about plan of care.

## 2017-10-30 NOTE — Progress Notes (Addendum)
Results for RASHEEDA, MULVEHILL (MRN [id number]) as of 10/30/2017 08:58  Ref. Range 10/29/2017 07:48 10/29/2017 12:05 10/29/2017 17:44 10/29/2017 21:38 10/30/2017 08:09  Glucose-Capillary Latest Ref Range: 70 - 99 mg/dL 242 (H) 245 (H) 288 (H) 207 (H) 349 (H)  Noted that patient's blood sugars have continued to be greater than 180 mg/dl. Recommend increasing Lantus to  36 units daily. (20% more  than Lantus 30 units)  Recommend adding Novolog 4 units TID with meals if eating at least 50% of meal. Continue Novolog RESISTANT correction scale TID & HS.   Harvel Ricks RN BSN CDE Diabetes Coordinator Pager: [phone]  8am-5pm

## 2017-10-30 NOTE — Evaluation (Signed)
Physical Therapy Evaluation Patient Details Name: [name] MRN: [id number] DOB: 1970/10/12 Today's Date: 10/30/2017   History of Present Illness  [name] is a 48 y.o F s/p abdominal abscess repair (10/27/2017) following a umbilical hernia repair with mesh on 10/16/2017. PMH includes T2DM, two spinal surgeries, R drop foot, and arthritis.   Clinical Impression  Pt presents with problems above and deficits below. Pt performed bed mobility with Supervision. Pt reported dizziness upon sitting at EOB. BP in supine 109/66 mmHg, sitting at EOB was 72/50 mmHg, and upon returning to supine 107/81 mmHg. Further mobility deferred due to orthostatic blood pressures. Anticipate pt will progress well once BP improves. Will continue to follow acutely to support independence, mobility, and safety.     Follow Up Recommendations Supervision for mobility/OOB;Home health PT    Equipment Recommendations  None recommended by PT    Recommendations for Other Services OT consult     Precautions / Restrictions Precautions Precautions: Fall;Other (comment)(Abdominal wound. ) Precaution Comments: Watch BP.  Restrictions Weight Bearing Restrictions: No      Mobility  Bed Mobility Overal bed mobility: Needs Assistance Bed Mobility: Supine to Sit;Sit to Supine     Supine to sit: Supervision Sit to supine: Supervision   General bed mobility comments: Pt educated log-roll technique and using abdominal pillow to reduce pain. Pt grimaced during bed mobility, but performed without physical assist. Pt reported dizziness upon sitting up. BP in supine was 109/66 mmHg. BP at EOB was 72/50 mmHg. Upon returning to supine, BP was 107/81. Pt is orthostatic. Deferred further activity due to BP.   Transfers                 General transfer comment: Deferred secondary to orthostatic blood pressure and dizziness  Ambulation/Gait             General Gait Details: Deferred secondary to  orthostatic blood pressure and dizziness  Stairs            Wheelchair Mobility    Modified Rankin (Stroke Patients Only)       Balance Overall balance assessment: Needs assistance Sitting-balance support: No upper extremity supported;Feet supported Sitting balance-Leahy Scale: Fair Sitting balance - Comments: Pt was able to sit at EOB and reported dizziness with sitting. Pt had poor posture, but had no visible LOB.                                     Pertinent Vitals/Pain Pain Assessment: 0-10 Pain Score: 6  Pain Location: Pt reports 6 pain from the back, and 3 pain in the abdomen when not moving. pt reports pain in abdomen is worse when moving.  Pain Descriptors / Indicators: Grimacing;Guarding;Burning Pain Intervention(s): Limited activity within patient's tolerance;Monitored during session;Repositioned    Home Living Family/patient expects to be discharged to:: Private residence Living Arrangements: Spouse/significant other Available Help at Discharge: Family;Available PRN/intermittently Type of Home: House Home Access: Stairs to enter Entrance Stairs-Rails: None Entrance Stairs-Number of Steps: 3 Home Layout: One level Home Equipment: Walker - 2 wheels;Bedside commode      Prior Function Level of Independence: Independent;Independent with assistive device(s)         Comments: Pt reports she could move around before 7/25 with some pain, and could get dressed independently. After 10/16/2017 surgery, pt reported using RW. R Drop foot is baseline.      Hand Dominance   Dominant  Hand: Right    Extremity/Trunk Assessment   Upper Extremity Assessment Upper Extremity Assessment: Defer to OT evaluation    Lower Extremity Assessment Lower Extremity Assessment: Generalized weakness    Cervical / Trunk Assessment Cervical / Trunk Assessment: Other exceptions(s/p abdominal surgery) Cervical / Trunk Exceptions: Pt has an open wound at umbilicus   Communication   Communication: No difficulties  Cognition Arousal/Alertness: Awake/alert Behavior During Therapy: WFL for tasks assessed/performed Overall Cognitive Status: Within Functional Limits for tasks assessed                                 General Comments: Pt is pleasant      General Comments General comments (skin integrity, edema, etc.): Pt is pleasant and compliant with PT    Exercises     Assessment/Plan    PT Assessment Patient needs continued PT services  PT Problem List Decreased range of motion;Decreased strength;Decreased activity tolerance;Decreased balance;Decreased mobility;Decreased knowledge of use of DME;Pain;Cardiopulmonary status limiting activity       PT Treatment Interventions DME instruction;Gait training;Stair training;Functional mobility training;Therapeutic activities;Therapeutic exercise;Balance training;Patient/family education    PT Goals (Current goals can be found in the Care Plan section)  Acute Rehab PT Goals Patient Stated Goal: To get back to her french bull dogs PT Goal Formulation: With patient Time For Goal Achievement: 11/13/17 Potential to Achieve Goals: Good    Frequency Min 3X/week   Barriers to discharge        Co-evaluation               AM-PAC PT "6 Clicks" Daily Activity  Outcome Measure Difficulty turning over in bed (including adjusting bedclothes, sheets and blankets)?: A Lot Difficulty moving from lying on back to sitting on the side of the bed? : A Lot Difficulty sitting down on and standing up from a chair with arms (e.g., wheelchair, bedside commode, etc,.)?: Unable Help needed moving to and from a bed to chair (including a wheelchair)?: A Lot Help needed walking in hospital room?: A Lot Help needed climbing 3-5 steps with a railing? : A Lot 6 Click Score: 11    End of Session   Activity Tolerance: Treatment limited secondary to medical complications (Comment)(Orthostatic  BP) Patient left: in bed;with call bell/phone within reach Nurse Communication: Mobility status PT Visit Diagnosis: Unsteadiness on feet (R26.81);Other abnormalities of gait and mobility (R26.89);Muscle weakness (generalized) (M62.81);Difficulty in walking, not elsewhere classified (R26.2);Other symptoms and signs involving the nervous system (R29.898);Dizziness and giddiness (R42);Pain Pain - part of body: (Abdomen, back)    Time: 1420-1440 PT Time Calculation (min) (ACUTE ONLY): 20 min   Charges:   PT Evaluation $PT Eval Moderate Complexity: [place], S-DPT [employer] [phone]     10/30/2017, 5:56 PM

## 2017-10-30 NOTE — Plan of Care (Signed)

## 2017-10-30 NOTE — Progress Notes (Signed)
[facility] Progress Note  2 Days Post-Op  Subjective: CC-  Overall doing well. States that she is still having some discomfort with dressing changes, but did well this morning when I changed it and required no pain medication. Denies any n/v. Passing flatus. Ate all of her breakfast this morning. Concerned about going home because she does not feel very confident mobilizing independently and is afraid that she is going to fall.  Objective: Vital signs in last 24 hours: Temp:  [97.3 F (36.3 C)-98.4 F (36.9 C)] 98.4 F (36.9 C) (08/08 0605) Pulse Rate:  [68-83] 68 (08/08 0605) Resp:  [16-18] 16 (08/08 0605) BP: (101-116)/(62-74) 108/69 (08/08 0605) SpO2:  [97 %-100 %] 99 % (08/08 0605) Last BM Date: 10/26/17  Intake/Output from previous day: 08/07 0701 - 08/08 0700 In: 1150 [P.O.:750; IV Piggyback:400] Out: -  Intake/Output this shift: No intake/output data recorded.  PE: Gen:  Alert, NAD, pleasant HEENT: EOM's intact, pupils equal and round Pulm:  effort normal Abd: obese, soft, tender around wound, wound about 7x3.5x9cm mostly beefy red with about 15% coverage with white slough, no purulent drainage or foul odor Psych: A&Ox3  Skin: no rashes noted, warm and dry  Lab Results:  Recent Labs    10/27/17 1846 10/30/17 0415  WBC 9.6 7.6  HGB 12.2 11.0*  HCT 37.2 34.4*  PLT 267 244   BMET Recent Labs    10/27/17 1846 10/28/17 2225 10/30/17 0415  NA 137  --  138  K 3.7  --  3.9  CL 107  --  108  CO2 21*  --  23  GLUCOSE 258* 455* 371*  BUN 12  --  13  CREATININE 0.88  --  0.99  CALCIUM 9.0  --  8.4*   PT/INR No results for input(s): LABPROT, INR in the last 72 hours. CMP     Component Value Date/Time   NA 138 10/30/2017 0415   K 3.9 10/30/2017 0415   CL 108 10/30/2017 0415   CO2 23 10/30/2017 0415   GLUCOSE 371 (H) 10/30/2017 0415   BUN 13 10/30/2017 0415   CREATININE 0.99 10/30/2017 0415   CALCIUM 8.4 (L) 10/30/2017 0415   PROT 7.2  08/19/2013 1420   ALBUMIN 3.7 08/19/2013 1420   AST 25 08/19/2013 1420   ALT 25 08/19/2013 1420   ALKPHOS 86 08/19/2013 1420   BILITOT 0.3 08/19/2013 1420   GFRNONAA >60 10/30/2017 0415   GFRAA >60 10/30/2017 0415   Lipase     Component Value Date/Time   LIPASE 21 06/11/2009 1550       Studies/Results: No results found.  Anti-infectives: Anti-infectives (From admission, onward)   Start     Dose/Rate Route Frequency Ordered Stop   10/30/17 1015  doxycycline (VIBRA-TABS) tablet 100 mg     100 mg Oral Every 12 hours 10/30/17 1010     10/29/17 1300  clindamycin (CLEOCIN) IVPB 600 mg  Status:  Discontinued     600 mg 100 mL/hr over 30 Minutes Intravenous Every 8 hours 10/29/17 1159 10/30/17 1010   10/29/17 0800  vancomycin (VANCOCIN) 1,250 mg in sodium chloride 0.9 % 250 mL IVPB  Status:  Discontinued     1,250 mg 166.7 mL/hr over 90 Minutes Intravenous Every 12 hours 10/28/17 1829 10/30/17 1010   10/28/17 1930  vancomycin (VANCOCIN) 1,500 mg in sodium chloride 0.9 % 500 mL IVPB     1,500 mg 250 mL/hr over 120 Minutes Intravenous NOW 10/28/17 1644 10/28/17 2153  10/28/17 1800  clindamycin (CLEOCIN) IVPB 600 mg  Status:  Discontinued     600 mg 100 mL/hr over 30 Minutes Intravenous Every 6 hours 10/28/17 1644 10/29/17 1159   10/27/17 1830  ciprofloxacin (CIPRO) IVPB 400 mg  Status:  Discontinued     400 mg 200 mL/hr over 60 Minutes Intravenous Every 12 hours 10/27/17 1817 10/28/17 1644   10/27/17 1830  metroNIDAZOLE (FLAGYL) IVPB 500 mg  Status:  Discontinued     500 mg 100 mL/hr over 60 Minutes Intravenous Every 8 hours 10/27/17 1817 10/28/17 1644       Assessment/Plan Hypothyroidism - home meds HTN T2DM - appreciate diabetes coordinator, increased to lantus 36 units daily, added Novolog 4 units TID with meals, continue SSI Fibromyalgia/chronic pain - home dose oxy, IV dilaudid PRN GERD CKD, unknown stage - Cr 0.99 today Depression Peripheral vascular  disease Obesity   S/p umbilical hernia repair with mesh 10/16/17 Dr. Ninfa Linden Post-operative abscess S/p incision and drainage of umbilical abscess 08/01/90 Dr. Rosendo Gros - POD#2 - gram stain growing gram positive cocci, report pending - WBC WNL, afebrile - Dressing changes with Dakin's solution BID; needs to clean up some more before considering wound vac - shower with wound open  FEN: CM diet, KVO IVF VTE: SCDs, lovenox ID: cipro/flagyl 8/5; vanc/clindamycin 8/6>>8/8, doxycycline 8/8>> Follow up: Dr. Ninfa Linden.  Plan: Consult PT.  Continue BID wet to dry dressing changes with Dakins. Shower with wound open. Transition to oral abx (doxycycline).  Possibly ready for discharge tomorrow.   LOS: 2 days    Wellington Hampshire , [facility] Surgery 10/30/2017, 10:13 AM Pager: [phone] Consults: [phone] Mon 7:00 am -11:30 AM Tues-Fri 7:00 am-4:30 pm Sat-Sun 7:00 am-11:30 am

## 2017-10-30 NOTE — Plan of Care (Signed)
  Problem: Education: Goal: Knowledge of General Education information will improve Description Including pain rating scale, medication(s)/side effects and non-pharmacologic comfort measures Outcome: Progressing Note:  POC reviewed with pt.   Problem: Pain Managment: Goal: General experience of comfort will improve Outcome: Progressing Note:  Pain med given prior to wound change approx. 30 min., and per pt. Is good.

## 2017-10-31 LAB — CBC WITH DIFFERENTIAL/PLATELET
Abs Immature Granulocytes: 0 10*3/uL (ref 0.0–0.1)
Basophils Absolute: 0 10*3/uL (ref 0.0–0.1)
Basophils Relative: 0 %
Eosinophils Absolute: 0 10*3/uL (ref 0.0–0.7)
Eosinophils Relative: 0 %
HCT: 34.4 % — ABNORMAL LOW (ref 36.0–46.0)
Hemoglobin: 10.6 g/dL — ABNORMAL LOW (ref 12.0–15.0)
Immature Granulocytes: 1 %
Lymphocytes Relative: 38 %
Lymphs Abs: 2.5 10*3/uL (ref 0.7–4.0)
MCH: 29.4 pg (ref 26.0–34.0)
MCHC: 30.8 g/dL (ref 30.0–36.0)
MCV: 95.3 fL (ref 78.0–100.0)
Monocytes Absolute: 0.6 10*3/uL (ref 0.1–1.0)
Monocytes Relative: 9 %
Neutro Abs: 3.3 10*3/uL (ref 1.7–7.7)
Neutrophils Relative %: 52 %
Platelets: 228 10*3/uL (ref 150–400)
RBC: 3.61 MIL/uL — ABNORMAL LOW (ref 3.87–5.11)
RDW: 12.4 % (ref 11.5–15.5)
WBC: 6.4 10*3/uL (ref 4.0–10.5)

## 2017-10-31 LAB — BASIC METABOLIC PANEL
Anion gap: 10 (ref 5–15)
BUN: 13 mg/dL (ref 6–20)
CO2: 23 mmol/L (ref 22–32)
Calcium: 8.4 mg/dL — ABNORMAL LOW (ref 8.9–10.3)
Chloride: 106 mmol/L (ref 98–111)
Creatinine, Ser: 1.13 mg/dL — ABNORMAL HIGH (ref 0.44–1.00)
GFR calc Af Amer: >60 mL/min (ref >60)
GFR calc non Af Amer: 56 mL/min — ABNORMAL LOW (ref >60)
Glucose, Bld: 616 mg/dL (ref 70–99)
Potassium: 4.1 mmol/L (ref 3.5–5.1)
Sodium: 139 mmol/L (ref 135–145)

## 2017-10-31 LAB — GLUCOSE, CAPILLARY
Glucose-Capillary: 474 mg/dL — ABNORMAL HIGH (ref 70–99)
Glucose-Capillary: 411 mg/dL — ABNORMAL HIGH (ref 70–99)
Glucose-Capillary: 278 mg/dL — ABNORMAL HIGH (ref 70–99)
Glucose-Capillary: 269 mg/dL — ABNORMAL HIGH (ref 70–99)
Glucose-Capillary: 320 mg/dL — ABNORMAL HIGH (ref 70–99)

## 2017-10-31 MED ORDER — SODIUM CHLORIDE 0.9 % IV SOLN
INTRAVENOUS | Status: DC
  Administered 2017-10-31 – 2017-11-01 (×3): via INTRAVENOUS

## 2017-10-31 MED ORDER — INSULIN GLARGINE 100 UNIT/ML ~~LOC~~ SOLN
44.0000 [IU] | Freq: Every day | SUBCUTANEOUS | Status: DC
  Administered 2017-10-31: 44 [IU] via SUBCUTANEOUS
  Filled 2017-10-31: qty 0.44

## 2017-10-31 NOTE — Evaluation (Signed)
Occupational Therapy Evaluation Patient Details Name: [name] MRN: [id number] DOB: [birth year] Today's Date: 10/31/2017    History of Present Illness [name] is a 48 y.o F s/p abdominal abscess repair (10/27/2017) following a umbilical hernia repair with mesh on 10/16/2017. PMH includes T2DM, two spinal surgeries, R drop foot, and arthritis.    Clinical Impression   This 47 y/o female presents with the above. At baseline pt reports independence with ADLs and functional mobility, reports she will return home with significant other who will be able to provide 24hr supervision/assist PRN. Pt demonstrating room level functional mobility using RW with minA this session; currently requires minguard assist for seated UB ADLs, minA for LB ADLs with use of AE. Pt mostly limited due to pain at this time, reports some dizziness initially during transfers, reports subsides with rest. BP monitored during session, initially 129/82 seated EOB, 112/81 after sit<>stand from EOB. Pt will benefit from continued OT services while in acute setting to maximize her overall safety and independence with ADLs and mobility prior to return home. Given available support at home do not anticipate pt will require follow up OT services. Will follow.     Follow Up Recommendations  No OT follow up;Supervision/Assistance - 24 hour;Other (comment)(24hr initially )    Equipment Recommendations  None recommended by OT;Other (comment)(pt's DME needs are met )           Precautions / Restrictions Precautions Precautions: Fall;Other (comment)(Abdominal wound. ) Precaution Comments: Watch BP.  Restrictions Weight Bearing Restrictions: No      Mobility Bed Mobility Overal bed mobility: Needs Assistance Bed Mobility: Supine to Sit;Sit to Supine     Supine to sit: Min assist Sit to supine: Min assist   General bed mobility comments: reviewed use of log roll technique for decreasing pain with mobilty; pt's  significant other provided assist to support trunk intially when transitioning into sitting. minA for LE management when returning to supine  Transfers Overall transfer level: Needs assistance Equipment used: Rolling walker (2 wheeled) Transfers: Sit to/from Stand Sit to Stand: Min assist         General transfer comment: minA to rise and steady at [place], pt demonstrates safe hand placement, increased time/effort     Balance Overall balance assessment: Needs assistance Sitting-balance support: No upper extremity supported;Feet supported Sitting balance-Leahy Scale: Good Sitting balance - Comments: Pt was able to sit at EOB and reported dizziness with sitting. Pt had poor posture, but had no visible LOB.   Standing balance support: Bilateral upper extremity supported;Single extremity supported Standing balance-Leahy Scale: Poor Standing balance comment: reliant on UE support during mobility at this time.                            ADL either performed or assessed with clinical judgement   ADL Overall ADL's : Needs assistance/impaired Eating/Feeding: Independent;Sitting   Grooming: Sitting   Upper Body Bathing: Min guard;Sitting   Lower Body Bathing: Minimal assistance;Sit to/from stand;With adaptive equipment   Upper Body Dressing : Min guard;Sitting   Lower Body Dressing: Minimal assistance;Sit to/from stand;With adaptive equipment Lower Body Dressing Details (indicate cue type and reason): pt reports she has AE at home for LB ADLs and is familiar with using Toilet Transfer: Minimal assistance;Ambulation;BSC;RW Toilet Transfer Details (indicate cue type and reason): BSC over toilet; simulated in transfer to/from EOB  Toileting- Clothing Manipulation and Hygiene: Minimal assistance;Sit to/from stand  Functional mobility during ADLs: Minimal assistance;Rolling walker General ADL Comments: pt performing bed mobility and sit<>stand from EOB x2 during session; BP  monitored during session, initially 129/82 sitting EOB, 112/81 after initial sit<>stand. Pt reports some dizziness with initial sitting, standing, and when initially returning to supine but subsides with rest. Educated on safety during transfers to ensure no dizziness prior to mobilizing away from seated surface      Vision   Additional Comments: pt reports dizziness with return to supine from sitting EOB, may benefit from visual assessment during next visit      Perception     Praxis      Pertinent Vitals/Pain Pain Assessment: Faces Faces Pain Scale: Hurts even more Pain Location: abdomen Pain Descriptors / Indicators: Grimacing;Guarding;Sore Pain Intervention(s): Monitored during session;Limited activity within patient's tolerance;RN gave pain meds during session     Hand Dominance Right   Extremity/Trunk Assessment Upper Extremity Assessment Upper Extremity Assessment: Generalized weakness       Cervical / Trunk Assessment Cervical / Trunk Assessment: Other exceptions(s/p abdominal surgery) Cervical / Trunk Exceptions: Pt has an open wound at umbilicus   Communication Communication Communication: No difficulties   Cognition Arousal/Alertness: Awake/alert Behavior During Therapy: WFL for tasks assessed/performed Overall Cognitive Status: Within Functional Limits for tasks assessed                                 General Comments: Pt is pleasant   General Comments       Exercises     Shoulder Instructions      Home Living Family/patient expects to be discharged to:: Private residence Living Arrangements: Spouse/significant other Available Help at Discharge: Family;Available PRN/intermittently Type of Home: House Home Access: Stairs to enter [employer] of Steps: 3 Entrance Stairs-Rails: None Home Layout: One level     Bathroom Shower/Tub: Teacher, early years/pre: Standard Bathroom Accessibility: Yes   Home Equipment:  Environmental consultant - 2 wheels;Bedside commode;Adaptive equipment;Tub bench Adaptive Equipment: Reacher;Sock aid        Prior Functioning/Environment Level of Independence: Independent;Independent with assistive device(s)        Comments: Pt reports she could move around before 7/25 with some pain, and could get dressed independently. After 10/16/2017 surgery, pt reported using RW. R Drop foot is baseline; used to work for the [employer]         OT Problem List: Decreased strength;Impaired balance (sitting and/or standing);Pain;Decreased activity tolerance      OT Treatment/Interventions: Self-care/ADL training;DME and/or AE instruction;Therapeutic activities;Balance training;Therapeutic exercise;Patient/family education;Energy conservation    OT Goals(Current goals can be found in the care plan section) Acute Rehab OT Goals Patient Stated Goal: to return home hopefully soon  OT Goal Formulation: With patient Time For Goal Achievement: 11/14/17 Potential to Achieve Goals: Good  OT Frequency: Min 2X/week   Barriers to D/C:            Co-evaluation              AM-PAC PT "6 Clicks" Daily Activity     Outcome Measure Help from another person eating meals?: None Help from another person taking care of personal grooming?: A Little Help from another person toileting, which includes using toliet, bedpan, or urinal?: A Little Help from another person bathing (including washing, rinsing, drying)?: A Little Help from another person to put on and taking off regular upper body clothing?: A Little Help from another person to  put on and taking off regular lower body clothing?: A Little 6 Click Score: 19   End of Session Equipment Utilized During Treatment: Gait belt;Rolling walker Nurse Communication: Mobility status  Activity Tolerance: Patient tolerated treatment well Patient left: in bed;with call bell/phone within reach;with family/visitor present  OT Visit Diagnosis: Unsteadiness on feet  (R26.81);Pain Pain - part of body: (abdomen )                Time: 0946(-61mn when talking to MD )-1020 OT Time Calculation (min): 34 min Charges:  OT General Charges $OT Visit: 1 Visit OT Evaluation $OT Eval Moderate Complexity: 1 Mod OT Treatments $Self Care/Home Management : 8-22 mins  BLou Cal OT Pager [phone]/11/2017   BRaymondo Band8/11/2017, 11:27 AM

## 2017-10-31 NOTE — Progress Notes (Signed)
Results for SYMIAH, NOWOTNY (MRN [id number]) as of 10/31/2017 09:19  Ref. Range 10/30/2017 12:05 10/30/2017 17:16 10/30/2017 21:25 10/31/2017 06:54 10/31/2017 08:54  Glucose-Capillary Latest Ref Range: 70 - 99 mg/dL 284 (H) 259 (H) 172 (H) 474 (H) 411 (H)  Noted that blood sugars continue to be elevated. Fasting blood sugar this morning is 474 mg/dl.  Noted that patient only received Lantus 30 units yesterday.   Recommend increasing Lantus to 44 units (home dose) starting this am and continuing Novolog correction scale as ordered and Novolog 4 units TID with meals if eating at least 50% of meals.  Harvel Ricks RN BSN CDE Diabetes Coordinator Pager: [phone]  8am-5pm

## 2017-10-31 NOTE — Progress Notes (Signed)
[facility] Progress Note  3 Days Post-Op  Subjective: CC: dizziness and nausea Patient complaining of dizziness and nausea overnight and yesterday afternoon. Blood sugars also quite elevated.  Abdominal pain in mid abdominal wall.   Objective: Vital signs in last 24 hours: Temp:  [98 F (36.7 C)-98.9 F (37.2 C)] 98 F (36.7 C) (08/09 0416) Pulse Rate:  [67-86] 67 (08/09 0416) Resp:  [18] 18 (08/09 0416) BP: (91-136)/(52-69) 136/69 (08/09 0416) SpO2:  [96 %-99 %] 99 % (08/09 0416) Last BM Date: 10/29/17  Intake/Output from previous day: 08/08 0701 - 08/09 0700 In: 947 [P.O.:120; I.V.:827] Out: 0  Intake/Output this shift: No intake/output data recorded.  PE: Gen:  Alert, NAD, pleasant Card:  Regular rate and rhythm, pedal pulses 2+ BL Pulm:  Normal effort, clear to auscultation bilaterally Abd: Soft, non-tender, non-distended, bowel sounds present, wound with good granulation tissue and some slough (7cm x 4cm x 10cm) Skin: warm and dry, no rashes  Psych: A&Ox3   Lab Results:  Recent Labs    10/30/17 0415 10/31/17 0434  WBC 7.6 6.4  HGB 11.0* 10.6*  HCT 34.4* 34.4*  PLT 244 228   BMET Recent Labs    10/28/17 2225 10/30/17 0415  NA  --  138  K  --  3.9  CL  --  108  CO2  --  23  GLUCOSE 455* 371*  BUN  --  13  CREATININE  --  0.99  CALCIUM  --  8.4*   PT/INR No results for input(s): LABPROT, INR in the last 72 hours. CMP     Component Value Date/Time   NA 138 10/30/2017 0415   K 3.9 10/30/2017 0415   CL 108 10/30/2017 0415   CO2 23 10/30/2017 0415   GLUCOSE 371 (H) 10/30/2017 0415   BUN 13 10/30/2017 0415   CREATININE 0.99 10/30/2017 0415   CALCIUM 8.4 (L) 10/30/2017 0415   PROT 7.2 08/19/2013 1420   ALBUMIN 3.7 08/19/2013 1420   AST 25 08/19/2013 1420   ALT 25 08/19/2013 1420   ALKPHOS 86 08/19/2013 1420   BILITOT 0.3 08/19/2013 1420   GFRNONAA >60 10/30/2017 0415   GFRAA >60 10/30/2017 0415   Lipase     Component Value  Date/Time   LIPASE 21 06/11/2009 1550       Studies/Results: No results found.  Anti-infectives: Anti-infectives (From admission, onward)   Start     Dose/Rate Route Frequency Ordered Stop   10/30/17 1030  doxycycline (VIBRA-TABS) tablet 100 mg     100 mg Oral Every 12 hours 10/30/17 1010     10/29/17 1300  clindamycin (CLEOCIN) IVPB 600 mg  Status:  Discontinued     600 mg 100 mL/hr over 30 Minutes Intravenous Every 8 hours 10/29/17 1159 10/30/17 1010   10/29/17 0800  vancomycin (VANCOCIN) 1,250 mg in sodium chloride 0.9 % 250 mL IVPB  Status:  Discontinued     1,250 mg 166.7 mL/hr over 90 Minutes Intravenous Every 12 hours 10/28/17 1829 10/30/17 1010   10/28/17 1930  vancomycin (VANCOCIN) 1,500 mg in sodium chloride 0.9 % 500 mL IVPB     1,500 mg 250 mL/hr over 120 Minutes Intravenous NOW 10/28/17 1644 10/28/17 2153   10/28/17 1800  clindamycin (CLEOCIN) IVPB 600 mg  Status:  Discontinued     600 mg 100 mL/hr over 30 Minutes Intravenous Every 6 hours 10/28/17 1644 10/29/17 1159   10/27/17 1830  ciprofloxacin (CIPRO) IVPB 400 mg  Status:  Discontinued     400 mg 200 mL/hr over 60 Minutes Intravenous Every 12 hours 10/27/17 1817 10/28/17 1644   10/27/17 1830  metroNIDAZOLE (FLAGYL) IVPB 500 mg  Status:  Discontinued     500 mg 100 mL/hr over 60 Minutes Intravenous Every 8 hours 10/27/17 1817 10/28/17 1644       Assessment/Plan Hypothyroidism - home meds HTN T2DM - appreciate diabetes coordinator, increased to lantus 44 units daily, added Novolog 4 units TID with meals, continue SSI Fibromyalgia/chronic pain - home dose oxy, IV dilaudid PRN GERD CKD, unknown stage - Cr 0.99 yesterday, recheck today  Depression Peripheral vascular disease Obesity   S/p umbilical hernia repair with mesh 10/16/17 Dr. Ninfa Linden Post-operative abscess S/p incision and drainage of umbilical abscess 10/24/93 Dr. Rosendo Gros - POD#3 - cx showing moderate actinomyces  - WBC WNL, afebrile -  Dressing changeswith saline WTD BID - shower with wound open Dizziness - could be multifactorial, orthostatics noted but I think standing reading likely not acurate - recheck BMET and continue IVF - continue to monitor blood sugars, hyperglycemia could be playing a role  FEN: CM diet, IVF VTE: SCDs, lovenox ID: cipro/flagyl 8/5; vanc/clindamycin 8/6>>8/8, doxycycline 8/8>> Follow up: Dr. Ninfa Linden.  Plan: Continue BID wet to dry dressing changes saline WTD. Shower with wound open. Continue oral abx (doxycycline).  Possibly ready for discharge tomorrow.   LOS: 3 days    Brigid Re , [facility] 10/31/2017, 9:38 AM Pager: [phone] Consults: [phone] Mon-Fri 7:00 am-4:30 pm Sat-Sun 7:00 am-11:30 am

## 2017-10-31 NOTE — Progress Notes (Signed)
Physical Therapy Treatment Patient Details Name: RAYVEN RETTIG MRN: [id number] DOB: Dec 01, 1970 Today's Date: 10/31/2017    History of Present Illness Ginnie Marich is a 47 y.o F s/p abdominal abscess repair (10/27/2017) following a umbilical hernia repair with mesh on 10/16/2017. PMH includes T2DM, two spinal surgeries, R drop foot, and arthritis.     PT Comments    Pt making excellent progress towards achieving her current functional mobility goals. Pt tolerated ambulation in hallway with min guard and RW for safety. Pt would continue to benefit from skilled physical therapy services at this time while admitted and after d/c to address the below listed limitations in order to improve overall safety and independence with functional mobility.   Follow Up Recommendations  Supervision for mobility/OOB;Home health PT     Equipment Recommendations  None recommended by PT    Recommendations for Other Services       Precautions / Restrictions Precautions Precautions: Fall Precaution Comments: monitor BP, open abdominal wound Restrictions Weight Bearing Restrictions: No    Mobility  Bed Mobility Overal bed mobility: Needs Assistance Bed Mobility: Supine to Sit;Sit to Supine     Supine to sit: Supervision Sit to supine: Supervision   General bed mobility comments: supervision for safety, no physical assistance required  Transfers Overall transfer level: Needs assistance Equipment used: Rolling walker (2 wheeled) Transfers: Sit to/from Stand Sit to Stand: Min guard         General transfer comment: min guard for safety, good technique, x2 from EOB x1 from toilet  Ambulation/Gait Ambulation/Gait assistance: Min guard Gait Distance (Feet): 100 Feet(100' x1, 20' x1) Assistive device: Rolling walker (2 wheeled) Gait Pattern/deviations: Step-through pattern;Decreased stride length;Decreased dorsiflexion - right;Steppage(Foot drop R LE) Gait velocity: decreased Gait  velocity interpretation: 1.31 - 2.62 ft/sec, indicative of limited community ambulator General Gait Details: pt steady with RW, consistent foot drop on R LE (pt usually wears on AFO)   Stairs             Wheelchair Mobility    Modified Rankin (Stroke Patients Only)       Balance Overall balance assessment: Needs assistance Sitting-balance support: No upper extremity supported;Feet supported Sitting balance-Leahy Scale: Good     Standing balance support: Bilateral upper extremity supported;Single extremity supported Standing balance-Leahy Scale: Poor                              Cognition Arousal/Alertness: Awake/alert Behavior During Therapy: WFL for tasks assessed/performed Overall Cognitive Status: Within Functional Limits for tasks assessed                                        Exercises      General Comments        Pertinent Vitals/Pain Pain Assessment: Faces Faces Pain Scale: Hurts even more Pain Location: abdomen Pain Descriptors / Indicators: Grimacing;Guarding;Sore Pain Intervention(s): Monitored during session;Repositioned    Home Living                      Prior Function            PT Goals (current goals can now be found in the care plan section) Acute Rehab PT Goals PT Goal Formulation: With patient Time For Goal Achievement: 11/13/17 Potential to Achieve Goals: Good Progress towards PT goals: Progressing toward goals  Frequency    Min 3X/week      PT Plan Current plan remains appropriate    Co-evaluation              AM-PAC PT "6 Clicks" Daily Activity  Outcome Measure  Difficulty turning over in bed (including adjusting bedclothes, sheets and blankets)?: A Little Difficulty moving from lying on back to sitting on the side of the bed? : A Little Difficulty sitting down on and standing up from a chair with arms (e.g., wheelchair, bedside commode, etc,.)?: Unable Help needed  moving to and from a bed to chair (including a wheelchair)?: None Help needed walking in hospital room?: A Little Help needed climbing 3-5 steps with a railing? : A Little 6 Click Score: 17    End of Session Equipment Utilized During Treatment: Gait belt Activity Tolerance: Patient tolerated treatment well Patient left: in bed;with call bell/phone within reach Nurse Communication: Mobility status PT Visit Diagnosis: Unsteadiness on feet (R26.81);Other abnormalities of gait and mobility (R26.89);Muscle weakness (generalized) (M62.81);Difficulty in walking, not elsewhere classified (R26.2);Other symptoms and signs involving the nervous system (R29.898);Dizziness and giddiness (R42);Pain Pain - part of body: (abdomen)     Time: 4967-5916 PT Time Calculation (min) (ACUTE ONLY): 131 min  Charges:  $Gait Training: 23-37 mins $Therapeutic Activity: 23-37 mins                     [place], [place], [place] 10/31/2017, 5:57 PM

## 2017-10-31 NOTE — Care Management Note (Signed)
Case Management Note  Patient Details  Name: [name] MRN: [id number] Date of Birth: Jul 27, 1970  Subjective/Objective:                    Action/Plan:  Spoke to patient and sign.other  at bedside. Explained patient and him will be taught how to change dressing before discharge because [facility] will not make daily visits. Both voiced understanding.   Patient will be discharging to [address], [place], [place] [address] phone [phone] Expected Discharge Date:                  Expected Discharge Plan:  [place]  In-House Referral:     Discharge planning Services  CM Consult  Post Acute Care Choice:  Home Health Choice offered to:  Patient  DME Arranged:  N/A DME Agency:  NA  HH Arranged:  RN [place] Agency:  [facility]  Status of Service:  Completed, signed off  If discussed at Central of Stay Meetings, dates discussed:    Additional Comments:  Marilu Favre, RN 10/31/2017, 10:08 AM

## 2017-11-01 LAB — TSH: TSH: 1.319 u[IU]/mL (ref 0.350–4.500)

## 2017-11-01 LAB — BASIC METABOLIC PANEL
Anion gap: 5 (ref 5–15)
BUN: 13 mg/dL (ref 6–20)
CO2: 27 mmol/L (ref 22–32)
Calcium: 8.5 mg/dL — ABNORMAL LOW (ref 8.9–10.3)
Chloride: 107 mmol/L (ref 98–111)
Creatinine, Ser: 0.91 mg/dL (ref 0.44–1.00)
GFR calc Af Amer: >60 mL/min (ref >60)
GFR calc non Af Amer: >60 mL/min (ref >60)
Glucose, Bld: 322 mg/dL — ABNORMAL HIGH (ref 70–99)
Potassium: 4.1 mmol/L (ref 3.5–5.1)
Sodium: 139 mmol/L (ref 135–145)

## 2017-11-01 LAB — GLUCOSE, CAPILLARY
Glucose-Capillary: 297 mg/dL — ABNORMAL HIGH (ref 70–99)
Glucose-Capillary: 293 mg/dL — ABNORMAL HIGH (ref 70–99)
Glucose-Capillary: 288 mg/dL — ABNORMAL HIGH (ref 70–99)
Glucose-Capillary: 224 mg/dL — ABNORMAL HIGH (ref 70–99)

## 2017-11-01 LAB — T4, FREE: Free T4: 0.97 ng/dL (ref 0.82–1.77)

## 2017-11-01 MED ORDER — INSULIN GLARGINE 100 UNIT/ML ~~LOC~~ SOLN
50.0000 [IU] | Freq: Every day | SUBCUTANEOUS | Status: DC
  Administered 2017-11-01: 50 [IU] via SUBCUTANEOUS
  Filled 2017-11-01: qty 0.5

## 2017-11-01 MED ORDER — AMOXICILLIN-POT CLAVULANATE 875-125 MG PO TABS
1.0000 | ORAL_TABLET | Freq: Two times a day (BID) | ORAL | Status: DC
  Administered 2017-11-01 – 2017-11-06 (×11): 1 via ORAL
  Filled 2017-11-01 (×11): qty 1

## 2017-11-01 MED ORDER — INSULIN ASPART 100 UNIT/ML ~~LOC~~ SOLN: 10.0000 [IU] | Freq: Three times a day (TID) | SUBCUTANEOUS | Status: DC | PRN
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
## 2017-11-01 NOTE — Progress Notes (Addendum)
4 Days Post-Op  Subjective: Stable and alert.  Asking when she can go home CBGs ranging from 269 up to 474.  Poor control. Lantus insulin increased to 44 units yesterday as recommended Being followed closely by diabetes coordinator Dr. Rosendo Gros said internal medicine would be following but that does not appear to be the case  Wound looks good Outpatient primary care and diabetes care has been a nurse practitioner to this point, according to the patient, as her physician left town in [place]  Objective: Vital signs in last 24 hours: Temp:  [98.1 F (36.7 C)-98.7 F (37.1 C)] 98.1 F (36.7 C) (08/10 0551) Pulse Rate:  [69-83] 69 (08/10 0551) Resp:  [18-20] 18 (08/10 0551) BP: (95-124)/(40-71) 124/71 (08/10 0551) SpO2:  [97 %-100 %] 97 % (08/10 0551) Last BM Date: 10/29/17  Intake/Output from previous day: 08/09 0701 - 08/10 0700 In: 3005.1 [P.O.:480; I.V.:2525.1] Out: -  Intake/Output this shift: Total I/O In: 2765.1 [P.O.:240; I.V.:2525.1] Out: -   General appearance: Alert.  No distress.  Ambulating independently.  Morbidly obese GI: Abdomen soft and nontender.  Open periumbilical wound looks clean.  Tunnels to the left a little bit but no odor or purulence.  I demonstrated dressing change to her boyfriend.  Lab Results:  Results for orders placed or performed during the hospital encounter of 10/27/17 (from the past 24 hour(s))  Glucose, capillary     Status: Abnormal   Collection Time: 10/31/17  6:54 AM  Result Value Ref Range   Glucose-Capillary 474 (H) 70 - 99 mg/dL  Glucose, capillary     Status: Abnormal   Collection Time: 10/31/17  8:54 AM  Result Value Ref Range   Glucose-Capillary 411 (H) 70 - 99 mg/dL  Glucose, capillary     Status: Abnormal   Collection Time: 10/31/17 12:24 PM  Result Value Ref Range   Glucose-Capillary 278 (H) 70 - 99 mg/dL  Glucose, capillary     Status: Abnormal   Collection Time: 10/31/17  5:17 PM  Result Value Ref Range   Glucose-Capillary 269 (H) 70 - 99 mg/dL  Glucose, capillary     Status: Abnormal   Collection Time: 10/31/17 10:07 PM  Result Value Ref Range   Glucose-Capillary 320 (H) 70 - 99 mg/dL     Studies/Results: No results found.  Marland Kitchen atorvastatin  20 mg Oral q1800  . doxycycline  100 mg Oral Q12H  . DULoxetine  120 mg Oral Daily  . enoxaparin (LOVENOX) injection  65 mg Subcutaneous Q24H  . fentaNYL  75 mcg Transdermal Q72H  . gabapentin  1,200 mg Oral TID  . insulin aspart  0-20 Units Subcutaneous TID WC  . insulin aspart  0-5 Units Subcutaneous QHS  . insulin aspart  4 Units Subcutaneous TID WC  . insulin glargine  44 Units Subcutaneous Daily  . levothyroxine  125 mcg Oral QAC breakfast  . lidocaine  2 patch Transdermal Q24H  . methocarbamol  1,000 mg Oral TID  . pantoprazole  80 mg Oral Daily     Assessment/Plan: s/p Procedure(s): INCISION AND DRAINAGE UMBILICAL HERNIA ABSCESS  S/p umbilical hernia repair with mesh 10/16/17 Dr. Ninfa Linden Post-operative abscess S/p incision and drainage of umbilical abscess 05/26/98 Dr. Rosendo Gros - POD#4 -cx showing moderate actinomyces -no sensitivity data but these are almost always sensitive to penicillins and amoxicillin.  She has taken amoxicillin on more than one occasion and has no problems with it.  I think I will switch her from doxycycline to Augmentin. - WBC  WNL, afebrile - Dressing changeswith saline WTD BID - shower with wound open  Discharge planning -diabetes needs to be under better control prior to discharge.  Will consult [facility] to help manage inpatient and outpatient diabetes plan                                  -Outpatient diabetes management needs to be secure, otherwise we have increased risk of facing a readmission.                                   -Boyfriend needs to learn dressing changes                                   -We will need HHN for twice daily dressing changes                                     -Follow-up Dr. Ninfa Linden approximately 1 week                                    -All of this may take another day or 2  Hypothyroidism - home meds HTN T2DM - appreciate diabetes coordinator,increased to lantus 44 unitsdaily, added Novolog 4 units TID with meals, continue SSI Fibromyalgia/chronic pain -home dose oxy, IV dilaudidPRN GERD CKD, unknown stage - Cr 0.99 yesterday, recheck today  Depression Peripheral vascular disease Obesity   @PROBHOSP @  LOS: 4 days    [name] [name] 11/01/2017  . .prob

## 2017-11-01 NOTE — Progress Notes (Signed)
Inpatient Diabetes Program Recommendations  AACE/ADA: New Consensus Statement on Inpatient Glycemic Control (2015)  Target Ranges:  Prepandial:   less than 140 mg/dL      Peak postprandial:   less than 180 mg/dL (1-2 hours)      Critically ill patients:  140 - 180 mg/dL   Lab Results  Component Value Date   GLUCAP 293 (H) 11/01/2017   HGBA1C 8.7 (H) 10/28/2017    Review of Glycemic Control  FBS and post-prandials elevated. Insulin adjustments ordered. Lantus increased to 50 units QD and Novolog meal coverage increased to 10 units tidwc.  Inpatient Diabetes Program Recommendations:     If FBS is > 180 mg/dL, increase Lantus to 55 units QD.  Will continue to monitor.  Thank you. Lorenda Peck, RD, LDN, CDE Inpatient Diabetes Coordinator [phone]

## 2017-11-02 DIAGNOSIS — T8149XA Infection following a procedure, other surgical site, initial encounter: Secondary | ICD-10-CM

## 2017-11-02 LAB — AEROBIC/ANAEROBIC CULTURE W GRAM STAIN (SURGICAL/DEEP WOUND): Gram Stain: NONE SEEN

## 2017-11-02 LAB — GLUCOSE, CAPILLARY
Glucose-Capillary: 392 mg/dL — ABNORMAL HIGH (ref 70–99)
Glucose-Capillary: 312 mg/dL — ABNORMAL HIGH (ref 70–99)
Glucose-Capillary: 350 mg/dL — ABNORMAL HIGH (ref 70–99)
Glucose-Capillary: 256 mg/dL — ABNORMAL HIGH (ref 70–99)
Glucose-Capillary: 180 mg/dL — ABNORMAL HIGH (ref 70–99)

## 2017-11-02 LAB — AEROBIC/ANAEROBIC CULTURE (SURGICAL/DEEP WOUND)

## 2017-11-02 MED ORDER — INSULIN ASPART 100 UNIT/ML ~~LOC~~ SOLN: 10.0000 [IU] | Freq: Three times a day (TID) | SUBCUTANEOUS | Status: DC

## 2017-11-02 MED ORDER — INSULIN GLARGINE 100 UNIT/ML ~~LOC~~ SOLN
55.0000 [IU] | Freq: Every day | SUBCUTANEOUS | Status: DC
  Administered 2017-11-02: 55 [IU] via SUBCUTANEOUS
  Filled 2017-11-02: qty 0.55

## 2017-11-02 NOTE — Progress Notes (Addendum)
5 Days Post-Op  Subjective: Stable and alert. I taught her boyfriend how to change the dressing yesterday but he has not demonstrated that he can change the dressing yet Wound looks good Appreciate internal medicine consult regarding diabetes.  Insulin doses adjusted.  CBGs have been less than 300 for the last 24 hours but still not controlled.  She was advised to have her PCP refer her to an endocrinologist and I think that is a good idea. She was advised to see a metabolism clinic in [place] but she states she cannot afford that.  Objective: Vital signs in last 24 hours: Temp:  [98.2 F (36.8 C)-98.8 F (37.1 C)] 98.2 F (36.8 C) (08/11 0619) Pulse Rate:  [62-82] 71 (08/11 0619) Resp:  [18-20] 18 (08/11 0619) BP: (104-138)/(59-74) 128/74 (08/11 0619) SpO2:  [97 %-98 %] 98 % (08/11 0619) Last BM Date: 10/30/17  Intake/Output from previous day: 08/10 0701 - 08/11 0700 In: 960 [P.O.:960] Out: -  Intake/Output this shift: No intake/output data recorded.  EXAM: General appearance: Alert.  No distress.  Ambulating independently.  Morbidly obese GI: Abdomen soft and nontender.  Open periumbilical wound looks clean.  Tunnels to the left a little bit but no odor or purulence.  I demonstrated dressing change to her boyfriend.   Lab Results:  Recent Labs    10/31/17 0434  WBC 6.4  HGB 10.6*  HCT 34.4*  PLT 228   BMET Recent Labs    10/31/17 0434 11/01/17 0549  NA 139 139  K 4.1 4.1  CL 106 107  CO2 23 27  GLUCOSE 616* 322*  BUN 13 13  CREATININE 1.13* 0.91  CALCIUM 8.4* 8.5*   PT/INR No results for input(s): LABPROT, INR in the last 72 hours. ABG No results for input(s): PHART, HCO3 in the last 72 hours.  Invalid input(s): PCO2, PO2  Studies/Results: No results found.  Anti-infectives: Anti-infectives (From admission, onward)   Start     Dose/Rate Route Frequency Ordered Stop   11/01/17 1000  amoxicillin-clavulanate (AUGMENTIN) 875-125 MG per tablet 1  tablet     1 tablet Oral Every 12 hours 11/01/17 0742     10/30/17 1030  doxycycline (VIBRA-TABS) tablet 100 mg  Status:  Discontinued     100 mg Oral Every 12 hours 10/30/17 1010 11/01/17 0742   10/29/17 1300  clindamycin (CLEOCIN) IVPB 600 mg  Status:  Discontinued     600 mg 100 mL/hr over 30 Minutes Intravenous Every 8 hours 10/29/17 1159 10/30/17 1010   10/29/17 0800  vancomycin (VANCOCIN) 1,250 mg in sodium chloride 0.9 % 250 mL IVPB  Status:  Discontinued     1,250 mg 166.7 mL/hr over 90 Minutes Intravenous Every 12 hours 10/28/17 1829 10/30/17 1010   10/28/17 1930  vancomycin (VANCOCIN) 1,500 mg in sodium chloride 0.9 % 500 mL IVPB     1,500 mg 250 mL/hr over 120 Minutes Intravenous NOW 10/28/17 1644 10/28/17 2153   10/28/17 1800  clindamycin (CLEOCIN) IVPB 600 mg  Status:  Discontinued     600 mg 100 mL/hr over 30 Minutes Intravenous Every 6 hours 10/28/17 1644 10/29/17 1159   10/27/17 1830  ciprofloxacin (CIPRO) IVPB 400 mg  Status:  Discontinued     400 mg 200 mL/hr over 60 Minutes Intravenous Every 12 hours 10/27/17 1817 10/28/17 1644   10/27/17 1830  metroNIDAZOLE (FLAGYL) IVPB 500 mg  Status:  Discontinued     500 mg 100 mL/hr over 60 Minutes Intravenous Every 8 hours  10/27/17 1817 10/28/17 1644      Assessment/Plan: s/p Procedure(s): INCISION AND DRAINAGE UMBILICAL HERNIA ABSCESS  S/p umbilical hernia repair with mesh 10/16/17 Dr. Ninfa Linden Post-operative abscess S/p incision and drainage of umbilical abscess 04/25/28 Dr. 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    I put her on Augmentin yesterday and she is tolerating that without any side effects - WBC WNL, afebrile - Dressing changeswithsaline WTDBID - shower with wound open  Diabetes mellitus.  Last A1c 8.7.  Control is improving but still not acceptable.  Appreciate internal  medicine consult and insulin adjustment.  Okay to discharge once diabetes management comes under control and outpatient management is arranged.   Discharge planning -from a surgical standpoint she may be discharged with twice daily wound care and home health nursing. -diabetes needs to be under better control prior to discharge.   Discharge home once cleared for discharge medically -.  Her primary care provider is a Designer, jewellery in [place].  She does not have a physician. -Outpatient referral to an endocrinologist is a good idea, and this can be arranged by her primary care provider                                   -Boyfriend needs to learn dressing changes                                   -We will need HHN for twice daily dressing changes                                    Dr. Nedra Hai will assume care tomorrow  Hypothyroidism - home meds HTN T2DM - appreciate diabetes coordinator,increased to lantus44unitsdaily, added Novolog 4 units TID with meals, continue SSI Fibromyalgia/chronic pain -home dose oxy, IV dilaudidPRN GERD CKD, unknown stage - Cr 0.99yesterday, recheck today Depression Peripheral vascular disease Obesity    LOS: 5 days    Adin Hector 11/02/2017  5 Days Post-Op   11/02/2017

## 2017-11-02 NOTE — Progress Notes (Signed)
CONSULT/PROGRESS NOTE    [name]  JOI:[id number] DOB: [birth year] DOA: 10/27/2017 PCP: Pecolia Ades, NP   Brief Narrative:  Patient is a 47 year old female with history of poorly controlled diabetes, obesity, status post open umbilical hernia repair with mesh on 10/16/2017 who was seen at urgent office a general surgery 10/23/2017 and a large hematoma was evacuated at that point from the inferior aspect of incision and patient placed on doxycycline.  Patient return for follow-up on 10/27/2017 with significant abdominal pain and weakness and drainage of a large amount of foul-smelling material.  Patient was admitted to the general surgical service who was admitted to the general surgical service for postoperative abscess and underwent incision and drainage of umbilical abscess 06/29/960 per Dr. Rosendo Gros.  [facility] were consulted for management of her poorly controlled diabetes and noted to have CBGs in the 600s.   Assessment & Plan:   Principal Problem:   Diabetes mellitus type 2 with complications, uncontrolled ([place]) Active Problems:   Morbidly obese ([place])   Hypothyroidism   Postoperative abscess  #1 poorly controlled type 2 diabetes Patient noted to be on Januvia, Victoza and Lantus without improvement in glucose levels.  Last A1c was 8.7 on 10/28/2017.  Patient symptomatic with polydipsia, polyuria and polyphagia.  Patient noted to have increased weight gain.  Patient started on Lantus and dose uptitrated to 50 units daily.  CBG at 392 this morning.  Will increase Lantus to 55 units daily.  Placed on meal coverage insulin NovoLog 10 units 3 times daily and uptitrate as needed for better blood glucose control.  Continue sliding scale insulin.  Will need outpatient follow-up with PCP and endocrinology likely for better control.  Diabetes coordinator following.  2.  Morbid obesity  3.  Hypothyroidism TSH 1.319. Continue home dose Synthroid.  4.  Postop abscess Status post  incision and drainage of umbilical abscess 10/26/6627 per Dr. Rosendo Gros.  Management per primary team.   DVT prophylaxis: Lovenox Code Status: Full Family Communication: Updated patient.  No family at bedside. Disposition Plan: Per primary team.   Consultants:   [facility] hospitalist: Dr. Evangeline Gula 11/01/2017  Procedures:   CT abdomen and pelvis 10/28/2017  Incision and drainage umbilical abscess per Dr. Rosendo Gros 10/28/2017  Antimicrobials:   Augmentin 11/01/2017  Doxycycline 10/30/2017>>>> 11/01/2017  IV ciprofloxacin 10/27/2017>>>> 10/28/2017  IV clindamycin 10/28/2017>>>>> 10/30/2017  IV vancomycin 10/28/2017>>>>> 10/30/2017   Subjective: Patient laying in bed on the phone.  Denies any chest pain no shortness of breath.  Objective: Vitals:   11/01/17 0551 11/01/17 1528 11/01/17 2109 11/02/17 0619  BP: 124/71 (!) 138/59 104/63 128/74  Pulse: 69 62 82 71  Resp: 18 20  18   Temp: 98.1 F (36.7 C) 98.3 F (36.8 C) 98.8 F (37.1 C) 98.2 F (36.8 C)  TempSrc: Oral Oral Oral Oral  SpO2: 97% 97% 97% 98%  Height:        Intake/Output Summary (Last 24 hours) at 11/02/2017 1245 Last data filed at 11/02/2017 0900 Gross per 24 hour  Intake 1080 ml  Output -  Net 1080 ml   There were no vitals filed for this visit.  Examination:  General exam: Appears calm and comfortable  Respiratory system: Clear to auscultation. Respiratory effort normal. Cardiovascular system: S1 & S2 heard, RRR. No JVD, murmurs, rubs, gallops or clicks. No pedal edema. Gastrointestinal system: Abdomen is mildly tender to palpation diffusely greater in the lower quadrants.  Positive bowel sounds.  Nondistended.  Central nervous system: Alert  and oriented. No focal neurological deficits. Extremities: Symmetric 5 x 5 power. Skin: No rashes, lesions or ulcers Psychiatry: Judgement and insight appear normal. Mood & affect appropriate.     Data Reviewed: I have personally reviewed following labs and imaging  studies  CBC: Recent Labs  Lab 10/27/17 1846 10/30/17 0415 10/31/17 0434  WBC 9.6 7.6 6.4  NEUTROABS 6.0  --  3.3  HGB 12.2 11.0* 10.6*  HCT 37.2 34.4* 34.4*  MCV 91.0 93.7 95.3  PLT 267 244 154   Basic Metabolic Panel: Recent Labs  Lab 10/27/17 1846 10/28/17 2225 10/30/17 0415 10/31/17 0434 11/01/17 0549  NA 137  --  138 139 139  K 3.7  --  3.9 4.1 4.1  CL 107  --  108 106 107  CO2 21*  --  23 23 27   GLUCOSE 258* 455* 371* 616* 322*  BUN 12  --  13 13 13   CREATININE 0.88  --  0.99 1.13* 0.91  CALCIUM 9.0  --  8.4* 8.4* 8.5*   GFR: CrCl cannot be calculated (Unknown ideal weight.). Liver Function Tests: No results for input(s): AST, ALT, ALKPHOS, BILITOT, PROT, ALBUMIN in the last 168 hours. No results for input(s): LIPASE, AMYLASE in the last 168 hours. No results for input(s): AMMONIA in the last 168 hours. Coagulation Profile: No results for input(s): INR, PROTIME in the last 168 hours. Cardiac Enzymes: No results for input(s): CKTOTAL, CKMB, CKMBINDEX, TROPONINI in the last 168 hours. BNP (last 3 results) No results for input(s): PROBNP in the last 8760 hours. HbA1C: No results for input(s): HGBA1C in the last 72 hours. CBG: Recent Labs  Lab 11/01/17 0720 11/01/17 1224 11/01/17 1725 11/01/17 2105 11/02/17 0809  GLUCAP 297* 293* 288* 224* 392*   Lipid Profile: No results for input(s): CHOL, HDL, LDLCALC, TRIG, CHOLHDL, LDLDIRECT in the last 72 hours. Thyroid Function Tests: Recent Labs    11/01/17 1520  TSH 1.319  FREET4 0.97   Anemia Panel: No results for input(s): VITAMINB12, FOLATE, FERRITIN, TIBC, IRON, RETICCTPCT in the last 72 hours. Sepsis Labs: No results for input(s): PROCALCITON, LATICACIDVEN in the last 168 hours.  Recent Results (from the past 240 hour(s))  Aerobic/Anaerobic Culture (surgical/deep wound)     Status: None (Preliminary result)   Collection Time: 10/28/17  2:46 PM  Result Value Ref Range Status   Specimen  Description ABSCESS ABDOMEN  Final   Special Requests NONE  Final   Gram Stain   Final    NO WBC SEEN RARE GRAM POSITIVE COCCI Performed at [facility], [address], [place], [place] [address]    Culture   Final    MODERATE ACTINOMYCES SPECIES Standardized susceptibility testing for this organism is not available. NO ANAEROBES ISOLATED; CULTURE IN PROGRESS FOR 5 DAYS    Report Status PENDING  Incomplete         Radiology Studies: No results found.      Scheduled Meds: . amoxicillin-clavulanate  1 tablet Oral Q12H  . atorvastatin  20 mg Oral q1800  . DULoxetine  120 mg Oral Daily  . enoxaparin (LOVENOX) injection  65 mg Subcutaneous Q24H  . fentaNYL  75 mcg Transdermal Q72H  . gabapentin  1,200 mg Oral TID  . insulin aspart  0-20 Units Subcutaneous TID WC  . insulin aspart  0-5 Units Subcutaneous QHS  . insulin aspart  10 Units Subcutaneous TID WC  . insulin glargine  55 Units Subcutaneous Daily  . levothyroxine  125  mcg Oral QAC breakfast  . lidocaine  2 patch Transdermal Q24H  . methocarbamol  1,000 mg Oral TID  . pantoprazole  80 mg Oral Daily   Continuous Infusions: . lactated ringers 10 mL/hr at 10/28/17 1330     LOS: 5 days    Time spent: 35 minutes    Irine Seal, MD [facility] Pager [phone]  If 7PM-7AM, please contact night-coverage [email or web address] Password [facility] 11/02/2017, 12:45 PM

## 2017-11-03 LAB — CBC WITH DIFFERENTIAL/PLATELET
Abs Immature Granulocytes: 0.1 10*3/uL (ref 0.0–0.1)
Basophils Absolute: 0 10*3/uL (ref 0.0–0.1)
Basophils Relative: 0 %
Eosinophils Absolute: 0 10*3/uL (ref 0.0–0.7)
Eosinophils Relative: 1 %
HCT: 37.3 % (ref 36.0–46.0)
Hemoglobin: 12.1 g/dL (ref 12.0–15.0)
Immature Granulocytes: 1 %
Lymphocytes Relative: 30 %
Lymphs Abs: 2.5 10*3/uL (ref 0.7–4.0)
MCH: 30 pg (ref 26.0–34.0)
MCHC: 32.4 g/dL (ref 30.0–36.0)
MCV: 92.3 fL (ref 78.0–100.0)
Monocytes Absolute: 0.6 10*3/uL (ref 0.1–1.0)
Monocytes Relative: 7 %
Neutro Abs: 5.3 10*3/uL (ref 1.7–7.7)
Neutrophils Relative %: 61 %
Platelets: 225 10*3/uL (ref 150–400)
RBC: 4.04 MIL/uL (ref 3.87–5.11)
RDW: 12.3 % (ref 11.5–15.5)
WBC: 8.5 10*3/uL (ref 4.0–10.5)

## 2017-11-03 LAB — BASIC METABOLIC PANEL
Anion gap: 8 (ref 5–15)
BUN: 14 mg/dL (ref 6–20)
CO2: 26 mmol/L (ref 22–32)
Calcium: 8.8 mg/dL — ABNORMAL LOW (ref 8.9–10.3)
Chloride: 104 mmol/L (ref 98–111)
Creatinine, Ser: 1 mg/dL (ref 0.44–1.00)
GFR calc Af Amer: >60 mL/min (ref >60)
GFR calc non Af Amer: >60 mL/min (ref >60)
Glucose, Bld: 435 mg/dL — ABNORMAL HIGH (ref 70–99)
Potassium: 4 mmol/L (ref 3.5–5.1)
Sodium: 138 mmol/L (ref 135–145)

## 2017-11-03 LAB — GLUCOSE, CAPILLARY
Glucose-Capillary: 305 mg/dL — ABNORMAL HIGH (ref 70–99)
Glucose-Capillary: 327 mg/dL — ABNORMAL HIGH (ref 70–99)
Glucose-Capillary: 160 mg/dL — ABNORMAL HIGH (ref 70–99)
Glucose-Capillary: 116 mg/dL — ABNORMAL HIGH (ref 70–99)

## 2017-11-03 LAB — C DIFFICILE QUICK SCREEN W PCR REFLEX
C Diff antigen: NEGATIVE
C Diff interpretation: NOT DETECTED
C Diff toxin: NEGATIVE

## 2017-11-03 LAB — MAGNESIUM: Magnesium: 1.5 mg/dL — ABNORMAL LOW (ref 1.7–2.4)

## 2017-11-03 MED ORDER — MAGNESIUM SULFATE 4 GM/100ML IV SOLN
4.0000 g | Freq: Once | INTRAVENOUS | Status: AC
  Administered 2017-11-03: 4 g via INTRAVENOUS
  Filled 2017-11-03: qty 100

## 2017-11-03 MED ORDER — INSULIN ASPART 100 UNIT/ML ~~LOC~~ SOLN: 15.0000 [IU] | Freq: Three times a day (TID) | SUBCUTANEOUS | Status: DC

## 2017-11-03 MED ORDER — HYDROMORPHONE HCL 1 MG/ML IJ SOLN
1.0000 mg | Freq: Four times a day (QID) | INTRAMUSCULAR | Status: DC | PRN
  Administered 2017-11-03 – 2017-11-06 (×8): 1 mg via INTRAVENOUS
  Filled 2017-11-03 (×8): qty 1

## 2017-11-03 MED ORDER — INSULIN ASPART 100 UNIT/ML ~~LOC~~ SOLN
15.0000 [IU] | Freq: Three times a day (TID) | SUBCUTANEOUS | Status: DC
  Administered 2017-11-03 – 2017-11-05 (×8): 15 [IU] via SUBCUTANEOUS

## 2017-11-03 MED ORDER — ELETRIPTAN HYDROBROMIDE 40 MG PO TABS
40.0000 mg | ORAL_TABLET | ORAL | Status: DC | PRN
  Administered 2017-11-03 – 2017-11-06 (×4): 40 mg via ORAL
  Filled 2017-11-03 (×6): qty 1

## 2017-11-03 MED ORDER — INSULIN GLARGINE 100 UNIT/ML ~~LOC~~ SOLN
65.0000 [IU] | Freq: Every day | SUBCUTANEOUS | Status: DC
  Administered 2017-11-03 – 2017-11-04 (×2): 65 [IU] via SUBCUTANEOUS
  Filled 2017-11-03 (×2): qty 0.65

## 2017-11-03 MED ORDER — HYDROMORPHONE HCL 2 MG PO TABS
4.0000 mg | ORAL_TABLET | ORAL | Status: DC | PRN
  Administered 2017-11-05 – 2017-11-06 (×5): 4 mg via ORAL
  Filled 2017-11-03 (×5): qty 2

## 2017-11-03 MED ORDER — HYDROMORPHONE HCL 1 MG/ML IJ SOLN: 0.5000 mg | Freq: Four times a day (QID) | INTRAMUSCULAR | Status: DC | PRN

## 2017-11-03 NOTE — Progress Notes (Signed)
CONSULT/PROGRESS NOTE    [name]  ZSW:[id number] DOB: 20-Jun-1970 DOA: 10/27/2017 PCP: Pecolia Ades, NP   Brief Narrative:  Patient is a 47 year old female with history of poorly controlled diabetes, obesity, status post open umbilical hernia repair with mesh on 10/16/2017 who was seen at urgent office a general surgery 10/23/2017 and a large hematoma was evacuated at that point from the inferior aspect of incision and patient placed on doxycycline.  Patient return for follow-up on 10/27/2017 with significant abdominal pain and weakness and drainage of a large amount of foul-smelling material.  Patient was admitted to the general surgical service who was admitted to the general surgical service for postoperative abscess and underwent incision and drainage of umbilical abscess 05/25/2023 per Dr. Rosendo Gros.  [facility] hospitalist were consulted for management of her poorly controlled diabetes and noted to have CBGs in the 600s.   Assessment & Plan:   Principal Problem:   Diabetes mellitus type 2 with complications, uncontrolled ([place]) Active Problems:   Morbidly obese ([place])   Hypothyroidism   Postoperative abscess  #1 poorly controlled type 2 diabetes Patient noted to be on Januvia, Victoza and Lantus without improvement in glucose levels.  Last A1c was 8.7 on 10/28/2017.  Patient symptomatic with polydipsia, polyuria and polyphagia.  Patient noted to have increased weight gain.  Patient started on Lantus and dose uptitrated to 50 units daily.  CBG at 305 this morning.  Will increase Lantus to 65 units daily.  Placed on meal coverage insulin NovoLog 10 units 3 times daily however patient not receiving new coverage insulin as parameters were initially placed per consulting physician.  Will remove parameters.  Increase NovoLog to 15 units 3 times daily with meals and uptitrate as needed for better blood glucose control.  Continue sliding scale insulin. Will need outpatient follow-up with PCP and  endocrinology likely for better control.  Diabetes coordinator following.  2.  Morbid obesity  3.  Hypothyroidism TSH 1.319.  Synthroid.  Repeat thyroid function studies in 4 to 6 weeks.    4.  Postop abscess Status post incision and drainage of umbilical abscess 06/24/7060 per Dr. Rosendo Gros.  Management per primary team.  5.  Nausea vomiting diarrhea Questionable etiology.  May be secondary to oral antibiotics.  Primary team is ordered a C. difficile PCR which is pending.  Supportive care.   DVT prophylaxis: Lovenox Code Status: Full Family Communication: Updated patient.  No family at bedside. Disposition Plan: Per primary team.   Consultants:   [facility] hospitalist: Dr. Evangeline Gula 11/01/2017  Procedures:   CT abdomen and pelvis 10/28/2017  Incision and drainage umbilical abscess per Dr. Rosendo Gros 10/28/2017  Antimicrobials:   Augmentin 11/01/2017  Doxycycline 10/30/2017>>>> 11/01/2017  IV ciprofloxacin 10/27/2017>>>> 10/28/2017  IV clindamycin 10/28/2017>>>>> 10/30/2017  IV vancomycin 10/28/2017>>>>> 10/30/2017   Subjective: Patient sleeping however easily arousable.  Denies any chest pain.  No shortness of breath.  Complaining of nausea and an episode of emesis early on today.  States has been having multiple loose watery stools over the past 24 to 48 hours with greater than 5 stools per day.   Objective: Vitals:   11/02/17 1624 11/02/17 2142 11/03/17 0522 11/03/17 1300  BP:  (!) 126/58 117/70 110/69  Pulse:  80 75 72  Resp:  18 16 16   Temp:  98.6 F (37 C) 98.5 F (36.9 C) 98.5 F (36.9 C)  TempSrc:  Oral Oral Oral  SpO2:  95% 99% 96%  Weight: (!) 139.7 kg  Height:        Intake/Output Summary (Last 24 hours) at 11/03/2017 1323 Last data filed at 11/03/2017 1030 Gross per 24 hour  Intake 600 ml  Output -  Net 600 ml   Filed Weights   11/02/17 1624  Weight: (!) 139.7 kg    Examination:  General exam: Appears calm and comfortable  Respiratory system: Lungs clear to  auscultation bilaterally.  No wheezes, no crackles, no rhonchi.  Respiratory effort normal. Cardiovascular system: Regular rate rhythm no murmurs rubs or gallops.  No JVD.  No lower extremity edema.   Gastrointestinal system: Abdomen is mildly tender to palpation diffusely greater in the lower quadrants.  Positive bowel sounds.  Nondistended.  Central nervous system: Alert and oriented. No focal neurological deficits. Extremities: Symmetric 5 x 5 power. Skin: No rashes, lesions or ulcers Psychiatry: Judgement and insight appear normal. Mood & affect appropriate.     Data Reviewed: I have personally reviewed following labs and imaging studies  CBC: Recent Labs  Lab 10/27/17 1846 10/30/17 0415 10/31/17 0434 11/03/17 0952  WBC 9.6 7.6 6.4 8.5  NEUTROABS 6.0  --  3.3 5.3  HGB 12.2 11.0* 10.6* 12.1  HCT 37.2 34.4* 34.4* 37.3  MCV 91.0 93.7 95.3 92.3  PLT 267 244 228 001   Basic Metabolic Panel: Recent Labs  Lab 10/27/17 1846 10/28/17 2225 10/30/17 0415 10/31/17 0434 11/01/17 0549 11/03/17 0952  NA 137  --  138 139 139 138  K 3.7  --  3.9 4.1 4.1 4.0  CL 107  --  108 106 107 104  CO2 21*  --  23 23 27 26   GLUCOSE 258* 455* 371* 616* 322* 435*  BUN 12  --  13 13 13 14   CREATININE 0.88  --  0.99 1.13* 0.91 1.00  CALCIUM 9.0  --  8.4* 8.4* 8.5* 8.8*  MG  --   --   --   --   --  1.5*   GFR: Estimated Creatinine Clearance: 98.3 mL/min (by C-G formula based on SCr of 1 mg/dL). Liver Function Tests: No results for input(s): AST, ALT, ALKPHOS, BILITOT, PROT, ALBUMIN in the last 168 hours. No results for input(s): LIPASE, AMYLASE in the last 168 hours. No results for input(s): AMMONIA in the last 168 hours. Coagulation Profile: No results for input(s): INR, PROTIME in the last 168 hours. Cardiac Enzymes: No results for input(s): CKTOTAL, CKMB, CKMBINDEX, TROPONINI in the last 168 hours. BNP (last 3 results) No results for input(s): PROBNP in the last 8760 hours. HbA1C: No  results for input(s): HGBA1C in the last 72 hours. CBG: Recent Labs  Lab 11/02/17 1644 11/02/17 1836 11/02/17 2140 11/03/17 0751 11/03/17 1204  GLUCAP 350* 256* 180* 305* 327*   Lipid Profile: No results for input(s): CHOL, HDL, LDLCALC, TRIG, CHOLHDL, LDLDIRECT in the last 72 hours. Thyroid Function Tests: Recent Labs    11/01/17 1520  TSH 1.319  FREET4 0.97   Anemia Panel: No results for input(s): VITAMINB12, FOLATE, FERRITIN, TIBC, IRON, RETICCTPCT in the last 72 hours. Sepsis Labs: No results for input(s): PROCALCITON, LATICACIDVEN in the last 168 hours.  Recent Results (from the past 240 hour(s))  Aerobic/Anaerobic Culture (surgical/deep wound)     Status: None   Collection Time: 10/28/17  2:46 PM  Result Value Ref Range Status   Specimen Description ABSCESS ABDOMEN  Final   Special Requests NONE  Final   Gram Stain   Final    NO WBC SEEN RARE GRAM  POSITIVE COCCI Performed at [facility], [place] [address], [place], [place] [address]    Culture   Final    MODERATE ACTINOMYCES SPECIES Standardized susceptibility testing for this organism is not available. MIXED ANAEROBIC FLORA PRESENT.  CALL LAB IF FURTHER IID REQUIRED.    Report Status 11/02/2017 FINAL  Final         Radiology Studies: No results found.      Scheduled Meds: . amoxicillin-clavulanate  1 tablet Oral Q12H  . atorvastatin  20 mg Oral q1800  . DULoxetine  120 mg Oral Daily  . enoxaparin (LOVENOX) injection  65 mg Subcutaneous Q24H  . fentaNYL  75 mcg Transdermal Q72H  . gabapentin  1,200 mg Oral TID  . insulin aspart  0-20 Units Subcutaneous TID WC  . insulin aspart  0-5 Units Subcutaneous QHS  . insulin aspart  15 Units Subcutaneous TID WC  . insulin glargine  65 Units Subcutaneous Daily  . levothyroxine  125 mcg Oral QAC breakfast  . lidocaine  2 patch Transdermal Q24H  . methocarbamol  1,000 mg Oral TID  . pantoprazole  80 mg Oral Daily   Continuous Infusions: . lactated  ringers 10 mL/hr at 10/28/17 1330  . magnesium sulfate 1 - 4 g bolus IVPB       LOS: 6 days    Time spent: 35 minutes    Irine Seal, MD [facility] Pager [phone]  If 7PM-7AM, please contact night-coverage [email or web address] Password [email or web address] 11/03/2017, 1:23 PM

## 2017-11-03 NOTE — Progress Notes (Signed)
OT Cancellation Note and Discharge  Patient Details Name: [name] MRN: [id number] DOB: December 29, 1970   Cancelled Treatment:    Reason Eval/Treat Not Completed: Other (comment). In to see pt for treatment session. Pt reports just back from bathroom and in a lot of pain in wound area. Pt reports she does not have any concerns about basic ADLs, has a tub bench at home, has all AE at home, and will have A of significant other prn post D/C, as well as she has been ambulating around unit without issues. No further OT needs identified, we will sign off  Almon Register [phone] 11/03/2017, 1:14 PM

## 2017-11-03 NOTE — Care Management Important Message (Signed)
Important Message  Patient Details  Name: [name] MRN: [id number] Date of Birth: 1970/05/31   Medicare Important Message Given:  Yes    Dalicia Kisner [address] 11/03/2017, 8:11 AM

## 2017-11-03 NOTE — Progress Notes (Signed)
[facility]/Trauma Progress Note  6 Days Post-Op   Assessment/Plan Hypothyroidism - home meds HTN T2DM - appreciate diabetes coordinator and medicine's assistance Fibromyalgia/chronic pain -home dose oxy, IV dilaudidPRN GERD CKD, unknown stage - Cr 0.99yesterday, recheck today Depression Peripheral vascular disease Obesity  S/p umbilical hernia repair with mesh 10/16/17 Dr. Ninfa Linden Post-operative abscess S/p incision and drainage of umbilical abscess 06/27/94 Dr. Rosendo Gros -cx showing moderate actinomyces and "mixed anaerobic flora"----no sensitivity data but these are almost always sensitive to penicillins and amoxicillin. Augmentin 08/11  - WBC WNL, afebrile - Dressing changeswithsaline WTDBID - shower with wound open Diarrhea - C diff pending  Diabetes mellitus.  Last A1c 8.7.  Control is improving but still not acceptable.  Appreciate internal medicine consult and insulin adjustment.  Will discharge once diabetes management comes under control and outpatient management is arranged.  Her primary care provider is a Designer, jewellery in [place]. Outpatient referral to an endocrinologist is a good idea, and this can be arranged by her primary care provider   FEN: carb modified VTE: SCD's, lovenox ID: Augmentin 08/10>> Foley: none Follow up: Dr. Ninfa Linden  DISPO: [facility] for wound, endocrine f/u per PCP. c diff pending. Possible discharge tomorrow pending c diff and glucose    LOS: 6 days    Subjective: CC: abdominal pain and diarrhea  Pt is having watery, foul smelling stools. Nausea and vomiting overnight. No fever or chills. Boyfriend just changed dressing  Objective: Vital signs in last 24 hours: Temp:  [98.5 F (36.9 C)-98.6 F (37 C)] 98.5 F (36.9 C) (08/12 0522) Pulse Rate:  [60-80] 75 (08/12 0522) Resp:  [16-18] 16 (08/12 0522) BP: (117-129)/(48-70) 117/70 (08/12 0522) SpO2:  [95 %-99 %] 99 % (08/12  0522) Weight:  [139.7 kg] 139.7 kg (08/11 1624) Last BM Date: 11/03/17  Intake/Output from previous day: 08/11 0701 - 08/12 0700 In: 720 [P.O.:720] Out: -  Intake/Output this shift: Total I/O In: 240 [P.O.:240] Out: -   PE: Gen:  Alert, NAD, pleasant, cooperative Card:  RRR, no M/G/R heard Pulm:  CTA, no W/R/R, rate and effort normal Abd: Soft, ND, obese, +BS, incisions C/D/I, mild TTP around umbilicus, no signs of peritonitis   Skin: no rashes noted, warm and dry   Anti-infectives: Anti-infectives (From admission, onward)   Start     Dose/Rate Route Frequency Ordered Stop   11/01/17 1000  amoxicillin-clavulanate (AUGMENTIN) 875-125 MG per tablet 1 tablet     1 tablet Oral Every 12 hours 11/01/17 0742     10/30/17 1030  doxycycline (VIBRA-TABS) tablet 100 mg  Status:  Discontinued     100 mg Oral Every 12 hours 10/30/17 1010 11/01/17 0742   10/29/17 1300  clindamycin (CLEOCIN) IVPB 600 mg  Status:  Discontinued     600 mg 100 mL/hr over 30 Minutes Intravenous Every 8 hours 10/29/17 1159 10/30/17 1010   10/29/17 0800  vancomycin (VANCOCIN) 1,250 mg in sodium chloride 0.9 % 250 mL IVPB  Status:  Discontinued     1,250 mg 166.7 mL/hr over 90 Minutes Intravenous Every 12 hours 10/28/17 1829 10/30/17 1010   10/28/17 1930  vancomycin (VANCOCIN) 1,500 mg in sodium chloride 0.9 % 500 mL IVPB     1,500 mg 250 mL/hr over 120 Minutes Intravenous NOW 10/28/17 1644 10/28/17 2153   10/28/17 1800  clindamycin (CLEOCIN) IVPB 600 mg  Status:  Discontinued     600 mg 100 mL/hr over 30 Minutes Intravenous Every 6 hours 10/28/17 1644 10/29/17 1159  10/27/17 1830  ciprofloxacin (CIPRO) IVPB 400 mg  Status:  Discontinued     400 mg 200 mL/hr over 60 Minutes Intravenous Every 12 hours 10/27/17 1817 10/28/17 1644   10/27/17 1830  metroNIDAZOLE (FLAGYL) IVPB 500 mg  Status:  Discontinued     500 mg 100 mL/hr over 60 Minutes Intravenous Every 8 hours 10/27/17 1817 10/28/17 1644      Lab  Results:  Recent Labs    11/03/17 0952  WBC 8.5  HGB 12.1  HCT 37.3  PLT 225   BMET Recent Labs    11/01/17 0549 11/03/17 0952  NA 139 138  K 4.1 4.0  CL 107 104  CO2 27 26  GLUCOSE 322* 435*  BUN 13 14  CREATININE 0.91 1.00  CALCIUM 8.5* 8.8*   PT/INR No results for input(s): LABPROT, INR in the last 72 hours. CMP     Component Value Date/Time   NA 138 11/03/2017 0952   K 4.0 11/03/2017 0952   CL 104 11/03/2017 0952   CO2 26 11/03/2017 0952   GLUCOSE 435 (H) 11/03/2017 0952   BUN 14 11/03/2017 0952   CREATININE 1.00 11/03/2017 0952   CALCIUM 8.8 (L) 11/03/2017 0952   PROT 7.2 08/19/2013 1420   ALBUMIN 3.7 08/19/2013 1420   AST 25 08/19/2013 1420   ALT 25 08/19/2013 1420   ALKPHOS 86 08/19/2013 1420   BILITOT 0.3 08/19/2013 1420   GFRNONAA >60 11/03/2017 0952   GFRAA >60 11/03/2017 0952   Lipase     Component Value Date/Time   LIPASE 21 06/11/2009 1550    Studies/Results: No results found.    Kalman Drape , [facility] 11/03/2017, 11:23 AM  Pager: [phone] Mon-Wed, Friday 7:00am-4:30pm Thurs 7am-11:30am  Consults: [phone]

## 2017-11-03 NOTE — Progress Notes (Addendum)
Inpatient Diabetes Program Recommendations  AACE/ADA: New Consensus Statement on Inpatient Glycemic Control (2019)  Target Ranges:  Prepandial:   less than 140 mg/dL      Peak postprandial:   less than 180 mg/dL (1-2 hours)      Critically ill patients:  140 - 180 mg/dL  Results for [name], [name] (MRN [id number]) as of 11/03/2017 08:00  Ref. Range 11/03/2017 07:51  Glucose-Capillary Latest Ref Range: 70 - 99 mg/dL 305 (H)   Results for [name], [name] (MRN [id number]) as of 11/03/2017 07:48  Ref. Range 11/02/2017 08:09 11/02/2017 13:23 11/02/2017 16:44 11/02/2017 18:36 11/02/2017 21:40  Glucose-Capillary Latest Ref Range: 70 - 99 mg/dL 392 (H) 312 (H) 350 (H) 256 (H) 180 (H)   Results for [name], [name] (MRN [id number]) as of 11/03/2017 07:48  Ref. Range 10/28/2017 04:27  Hemoglobin A1C Latest Ref Range: 4.8 - 5.6 % 8.7 (H)   Review of Glycemic Control  Diabetes history: DM2 Outpatient Diabetes medications: Lantus 44 units QHS, Januvia 50 mg daily, Victoza 1.8 mg daily Current orders for Inpatient glycemic control: Lantus 55 units daily, Novolog 10 units TID with meals, Novolog 0-20 units TID with meals, Novolog 0-5 units QHS  Inpatient Diabetes Program Recommendations:  Insulin - Basal: Please consider increasing Lantus to 65 units daily. Insulin - Meal Coverage: Please consider increasing meal coverage to Novolog 15 units TID with meals. HgbA1C: A1C 8.7% on 10/28/17 indicating an average glucose of 203 mg/dl over the past 2-3 months. Recommend patient establish care with an Endocrinologist to assist with DM management.  Addendum 11/03/17@13 :45-Spoke with patient about diabetes and home regimen for diabetes control. Patient reports that she is followed by PCP for diabetes management and currently she takes Lantus 44 units QHS, Januvia 50 mg daily, and Victoza 1.8 mg daily as an outpatient for diabetes control. Patient reports that she has only been taking the Lantus for a few months.  Patient reports that she use to take insulin in the past and she lost over 100 lbs and was able to come off of insulin and was just taking the Januvia and Victoza for DM control. Patient reports that she was checking glucose twice a day and it had been consistently in the mid 100's mg/dl but she noted it has been more elevated over the past few days.  Patient reports that her A1C had gotten down in the 5% range but had went back up over 10% so her PCP started her back on Lantus at that time. Patient states that her last A1C (with the Lantus) was in the 8% range. Discussed A1C results (8.7% on 10/28/17) and explained that her current A1C indicates an average glucose of 203 mg/dl over the past 2-3 months. Discussed glucose and A1C goals. Discussed importance of checking CBGs and maintaining good CBG control to prevent long-term and short-term complications. Stressed to the patient the importance of improving glycemic control to prevent further complications from uncontrolled diabetes. Discussed impact of nutrition, exercise, stress, sickness, and medications on diabetes control. Encouraged patient to get established with an Endocrinologist for assistance with improving DM control. Patient plans to find a Probation officer in [place]. Asked that she keep a detailed record of glucose trends and how much insulin she is taking which she needs to take to follow up appointments.  Patient verbalized understanding of information discussed and she states that she has no further questions at this time related to diabetes. Patient appears to be very motivated to get DM better  controlled.   Thanks, Barnie Alderman, RN, MSN, CDE Diabetes Coordinator Inpatient Diabetes Program [phone] (Team Pager from 8am to 5pm)

## 2017-11-04 LAB — BASIC METABOLIC PANEL
Anion gap: 8 (ref 5–15)
BUN: 14 mg/dL (ref 6–20)
CO2: 26 mmol/L (ref 22–32)
Calcium: 8.9 mg/dL (ref 8.9–10.3)
Chloride: 105 mmol/L (ref 98–111)
Creatinine, Ser: 0.98 mg/dL (ref 0.44–1.00)
GFR calc Af Amer: >60 mL/min (ref >60)
GFR calc non Af Amer: >60 mL/min (ref >60)
Glucose, Bld: 281 mg/dL — ABNORMAL HIGH (ref 70–99)
Potassium: 4.1 mmol/L (ref 3.5–5.1)
Sodium: 139 mmol/L (ref 135–145)

## 2017-11-04 LAB — CBC WITH DIFFERENTIAL/PLATELET
Abs Immature Granulocytes: 0.1 10*3/uL (ref 0.0–0.1)
Basophils Absolute: 0 10*3/uL (ref 0.0–0.1)
Basophils Relative: 0 %
Eosinophils Absolute: 0 10*3/uL (ref 0.0–0.7)
Eosinophils Relative: 0 %
HCT: 35.3 % — ABNORMAL LOW (ref 36.0–46.0)
Hemoglobin: 11.5 g/dL — ABNORMAL LOW (ref 12.0–15.0)
Immature Granulocytes: 1 %
Lymphocytes Relative: 31 %
Lymphs Abs: 2.7 10*3/uL (ref 0.7–4.0)
MCH: 30 pg (ref 26.0–34.0)
MCHC: 32.6 g/dL (ref 30.0–36.0)
MCV: 92.2 fL (ref 78.0–100.0)
Monocytes Absolute: 0.8 10*3/uL (ref 0.1–1.0)
Monocytes Relative: 9 %
Neutro Abs: 5 10*3/uL (ref 1.7–7.7)
Neutrophils Relative %: 59 %
Platelets: 210 10*3/uL (ref 150–400)
RBC: 3.83 MIL/uL — ABNORMAL LOW (ref 3.87–5.11)
RDW: 12.4 % (ref 11.5–15.5)
WBC: 8.5 10*3/uL (ref 4.0–10.5)

## 2017-11-04 LAB — GLUCOSE, CAPILLARY
Glucose-Capillary: 354 mg/dL — ABNORMAL HIGH (ref 70–99)
Glucose-Capillary: 213 mg/dL — ABNORMAL HIGH (ref 70–99)
Glucose-Capillary: 129 mg/dL — ABNORMAL HIGH (ref 70–99)
Glucose-Capillary: 207 mg/dL — ABNORMAL HIGH (ref 70–99)
Glucose-Capillary: 223 mg/dL — ABNORMAL HIGH (ref 70–99)

## 2017-11-04 LAB — MAGNESIUM: Magnesium: 1.7 mg/dL (ref 1.7–2.4)

## 2017-11-04 MED ORDER — MAGNESIUM SULFATE 4 GM/100ML IV SOLN
4.0000 g | Freq: Once | INTRAVENOUS | Status: AC
  Administered 2017-11-04: 4 g via INTRAVENOUS
  Filled 2017-11-04: qty 100

## 2017-11-04 MED ORDER — INSULIN GLARGINE 100 UNIT/ML ~~LOC~~ SOLN
35.0000 [IU] | Freq: Two times a day (BID) | SUBCUTANEOUS | Status: DC
  Administered 2017-11-05 (×2): 35 [IU] via SUBCUTANEOUS
  Filled 2017-11-04 (×2): qty 0.35

## 2017-11-04 MED ORDER — LOPERAMIDE HCL 2 MG PO CAPS
2.0000 mg | ORAL_CAPSULE | ORAL | Status: DC | PRN
  Administered 2017-11-04: 2 mg via ORAL
  Filled 2017-11-04: qty 1

## 2017-11-04 MED ORDER — INSULIN GLARGINE 100 UNIT/ML ~~LOC~~ SOLN
5.0000 [IU] | Freq: Once | SUBCUTANEOUS | Status: AC
  Administered 2017-11-04: 5 [IU] via SUBCUTANEOUS
  Filled 2017-11-04: qty 0.05

## 2017-11-04 NOTE — Plan of Care (Signed)

## 2017-11-04 NOTE — Progress Notes (Signed)
Physical Therapy Treatment Patient Details Name: [name] MRN: [id number] DOB: 1970-06-28 Today's Date: 11/04/2017    History of Present Illness Teighlor Korson is a 47 y.o F s/p abdominal abscess repair (10/27/2017) following a umbilical hernia repair with mesh on 10/16/2017. PMH includes T2DM, two spinal surgeries, R drop foot, and arthritis.     PT Comments    Pt performed gait training and progression to stair training for simulation of entry into home at d/c.  Pt tolerated session well and reports pain is better controlled.  Will continue to recommend HHPT at d/c to improve function as she remains to require use of RW and she was not using this device at baseline.    Follow Up Recommendations  Supervision for mobility/OOB;Home health PT     Equipment Recommendations  None recommended by PT    Recommendations for Other Services       Precautions / Restrictions Precautions Precautions: Fall Precaution Comments: monitor BP, open abdominal wound Restrictions Weight Bearing Restrictions: No    Mobility  Bed Mobility Overal bed mobility: Needs Assistance       Supine to sit: Supervision Sit to supine: Supervision   General bed mobility comments: supervision for safety, no physical assistance required  Transfers Overall transfer level: Needs assistance Equipment used: None Transfers: Sit to/from Stand Sit to Stand: Supervision         General transfer comment: Pt with poor balance in standing and require use of RW to progress to gait training.    Ambulation/Gait Ambulation/Gait assistance: Supervision Gait Distance (Feet): 400 Feet Assistive device: Rolling walker (2 wheeled) Gait Pattern/deviations: Step-through pattern;Decreased stride length;Decreased dorsiflexion - right;Steppage Gait velocity: decreased   General Gait Details: pt steady with RW, consistent foot drop on R LE (pt usually wears on AFO), cues for pacing patient presents with noticeable  DOE.     Stairs Stairs: Yes Stairs assistance: Supervision Stair Management: One rail Right;Sideways;Forwards Number of Stairs: 4 General stair comments: Forwards to ascend and sideways to descend.  Pt slow and guarded due to pain in abdomen.  Simulated curb negotiation on level surface for carryover at home.     Wheelchair Mobility    Modified Rankin (Stroke Patients Only)       Balance Overall balance assessment: Needs assistance   Sitting balance-Leahy Scale: Good       Standing balance-Leahy Scale: Poor                              Cognition Arousal/Alertness: Awake/alert Behavior During Therapy: WFL for tasks assessed/performed Overall Cognitive Status: Within Functional Limits for tasks assessed                                        Exercises      General Comments        Pertinent Vitals/Pain Pain Assessment: Faces Faces Pain Scale: Hurts a little bit Pain Location: abdomen Pain Descriptors / Indicators: Sore Pain Intervention(s): Monitored during session;Repositioned    Home Living                      Prior Function            PT Goals (current goals can now be found in the care plan section) Acute Rehab PT Goals Patient Stated Goal: to return home hopefully soon  Potential to Achieve Goals: Good Progress towards PT goals: Progressing toward goals    Frequency    Min 3X/week      PT Plan Current plan remains appropriate    Co-evaluation              AM-PAC PT "6 Clicks" Daily Activity  Outcome Measure  Difficulty turning over in bed (including adjusting bedclothes, sheets and blankets)?: A Little Difficulty moving from lying on back to sitting on the side of the bed? : A Little Difficulty sitting down on and standing up from a chair with arms (e.g., wheelchair, bedside commode, etc,.)?: A Little Help needed moving to and from a bed to chair (including a wheelchair)?: A Little Help  needed walking in hospital room?: A Little Help needed climbing 3-5 steps with a railing? : A Little 6 Click Score: 18    End of Session Equipment Utilized During Treatment: Gait belt Activity Tolerance: Patient tolerated treatment well Patient left: with call bell/phone within reach;in chair Nurse Communication: Mobility status PT Visit Diagnosis: Unsteadiness on feet (R26.81);Other abnormalities of gait and mobility (R26.89);Muscle weakness (generalized) (M62.81);Difficulty in walking, not elsewhere classified (R26.2);Other symptoms and signs involving the nervous system (R29.898);Dizziness and giddiness (R42);Pain Pain - part of body: (abdomen)     Time: 9983-3825 PT Time Calculation (min) (ACUTE ONLY): 27 min  Charges:  $Gait Training: 8-22 mins $Therapeutic Activity: 8-22 mins                     Governor Rooks, PTA pager [phone]    Cristela Blue 11/04/2017, 3:18 PM

## 2017-11-04 NOTE — Progress Notes (Signed)
PT Cancellation Note  Patient Details Name: [name] MRN: [id number] DOB: 07/06/1970   Cancelled Treatment:    Reason Eval/Treat Not Completed: (P) Pain limiting ability to participate(Pt reports incisional pain is too great to participate she reports she has received pain meds and will be tolerant of mobility later this pm.  Discussed need for stair training this pm.  Will f/u per POC.  )   Jaiveon Suppes Eli Hose 11/04/2017, 1:24 PM  Governor Rooks, PTA pager [phone]

## 2017-11-04 NOTE — Progress Notes (Signed)
Inpatient Diabetes Program Recommendations  AACE/ADA: New Consensus Statement on Inpatient Glycemic Control (2015)  Target Ranges:  Prepandial:   less than 140 mg/dL      Peak postprandial:   less than 180 mg/dL (1-2 hours)      Critically ill patients:  140 - 180 mg/dL   Lab Results  Component Value Date   GLUCAP 129 (H) 11/04/2017   HGBA1C 8.7 (H) 10/28/2017    Review of Glycemic Control  FBS 354. Post-prandials look good.  Recommendations:  Increase Lantus to 70 units QD.  Continue to follow.  Thank you. Lorenda Peck, RD, LDN, CDE Inpatient Diabetes Coordinator [phone]

## 2017-11-04 NOTE — Progress Notes (Signed)
CONSULT/PROGRESS NOTE    [name]  AYT:[id number] DOB: [birth year] DOA: 10/27/2017 PCP: Pecolia Ades, NP   Brief Narrative:  Patient is a 47 year old female with history of poorly controlled diabetes, obesity, status post open umbilical hernia repair with mesh on 10/16/2017 who was seen at urgent office a general surgery 10/23/2017 and a large hematoma was evacuated at that point from the inferior aspect of incision and patient placed on doxycycline.  Patient return for follow-up on 10/27/2017 with significant abdominal pain and weakness and drainage of a large amount of foul-smelling material.  Patient was admitted to the general surgical service who was admitted to the general surgical service for postoperative abscess and underwent incision and drainage of umbilical abscess 05/28/5730 per Dr. Rosendo Gros.  [facility] hospitalist were consulted for management of her poorly controlled diabetes and noted to have CBGs in the 600s.   Assessment & Plan:   Principal Problem:   Diabetes mellitus type 2 with complications, uncontrolled ([place]) Active Problems:   Morbidly obese ([place])   Hypothyroidism   Postoperative abscess  #1 poorly controlled type 2 diabetes Patient noted to be on Januvia, Victoza and Lantus without improvement in glucose levels.  Last A1c was 8.7 on 10/28/2017.  Patient symptomatic with polydipsia, polyuria and polyphagia.  Patient noted to have increased weight gain.  Patient started on Lantus and dose uptitrated to 65 units daily.  CBG at 354 this morning.  Postprandial blood glucose levels seem to be well controlled.  Patient already received Lantus 65 units daily this morning.  We will give additional 5 units of Lantus now.  Will place on Lantus 35 units twice daily starting tomorrow morning 11/05/2017.  Continue current dose of NovoLog 15 units 3 times daily for meal coverage.  Continue sliding scale insulin. Will need outpatient follow-up with PCP and endocrinology for better  control.  Diabetes coordinator following.  2.  Morbid obesity  3.  Hypothyroidism TSH 1.319.  Synthroid.  Repeat thyroid function studies in 4 to 6 weeks.    4.  Postop abscess Status post incision and drainage of umbilical abscess 2/0/2542 per Dr. Rosendo Gros.  Management per primary team.  5.  Nausea vomiting diarrhea Likely secondary to oral antibiotics.  C. difficile PCR is negative.  Patient started on Imodium as needed.    DVT prophylaxis: Lovenox Code Status: Full Family Communication: Updated patient.  No family at bedside. Disposition Plan: Per primary team.   Consultants:   [facility] hospitalist: Dr. Evangeline Gula 11/01/2017  Procedures:   CT abdomen and pelvis 10/28/2017  Incision and drainage umbilical abscess per Dr. Rosendo Gros 10/28/2017  Antimicrobials:   Augmentin 11/01/2017  Doxycycline 10/30/2017>>>> 11/01/2017  IV ciprofloxacin 10/27/2017>>>> 10/28/2017  IV clindamycin 10/28/2017>>>>> 10/30/2017  IV vancomycin 10/28/2017>>>>> 10/30/2017   Subjective: Patient states did not eat too well for dinner yesterday.  Denies any chest pain.  No shortness of breath.  Still with loose stools and stated was given some Imodium a few minutes ago.    Objective: Vitals:   11/03/17 0522 11/03/17 1300 11/03/17 2121 11/04/17 0520  BP: 117/70 110/69 108/71 108/61  Pulse: 75 72 72 72  Resp: 16 16 16 16   Temp: 98.5 F (36.9 C) 98.5 F (36.9 C) 99 F (37.2 C) 98.7 F (37.1 C)  TempSrc: Oral Oral Oral Oral  SpO2: 99% 96% 98% 92%  Weight:      Height:        Intake/Output Summary (Last 24 hours) at 11/04/2017 1223 Last data filed at  11/03/2017 2125 Gross per 24 hour  Intake 120 ml  Output -  Net 120 ml   Filed Weights   11/02/17 1624  Weight: (!) 139.7 kg    Examination:  General exam: Appears calm and comfortable  Respiratory system: CTAB.  No wheezes, no crackles, no rhonchi.  Normal respiratory effort.   Cardiovascular system: RRR no murmurs rubs or gallops.  No JVD.  No lower  extremity edema.    Gastrointestinal system: Abdomen is mildly tender to palpation diffusely greater in the lower quadrants.  Positive bowel sounds.  Nondistended.  Central nervous system: Alert and oriented. No focal neurological deficits. Extremities: Symmetric 5 x 5 power. Skin: No rashes, lesions or ulcers Psychiatry: Judgement and insight appear normal. Mood & affect appropriate.     Data Reviewed: I have personally reviewed following labs and imaging studies  CBC: Recent Labs  Lab 10/30/17 0415 10/31/17 0434 11/03/17 0952 11/04/17 0436  WBC 7.6 6.4 8.5 8.5  NEUTROABS  --  3.3 5.3 5.0  HGB 11.0* 10.6* 12.1 11.5*  HCT 34.4* 34.4* 37.3 35.3*  MCV 93.7 95.3 92.3 92.2  PLT 244 228 225 818   Basic Metabolic Panel: Recent Labs  Lab 10/30/17 0415 10/31/17 0434 11/01/17 0549 11/03/17 0952 11/04/17 0436  NA 138 139 139 138 139  K 3.9 4.1 4.1 4.0 4.1  CL 108 106 107 104 105  CO2 23 23 27 26 26   GLUCOSE 371* 616* 322* 435* 281*  BUN 13 13 13 14 14   CREATININE 0.99 1.13* 0.91 1.00 0.98  CALCIUM 8.4* 8.4* 8.5* 8.8* 8.9  MG  --   --   --  1.5* 1.7   GFR: Estimated Creatinine Clearance: 100.3 mL/min (by C-G formula based on SCr of 0.98 mg/dL). Liver Function Tests: No results for input(s): AST, ALT, ALKPHOS, BILITOT, PROT, ALBUMIN in the last 168 hours. No results for input(s): LIPASE, AMYLASE in the last 168 hours. No results for input(s): AMMONIA in the last 168 hours. Coagulation Profile: No results for input(s): INR, PROTIME in the last 168 hours. Cardiac Enzymes: No results for input(s): CKTOTAL, CKMB, CKMBINDEX, TROPONINI in the last 168 hours. BNP (last 3 results) No results for input(s): PROBNP in the last 8760 hours. HbA1C: No results for input(s): HGBA1C in the last 72 hours. CBG: Recent Labs  Lab 11/03/17 0751 11/03/17 1204 11/03/17 1752 11/03/17 2119 11/04/17 0820  GLUCAP 305* 327* 160* 116* 354*   Lipid Profile: No results for input(s): CHOL,  HDL, LDLCALC, TRIG, CHOLHDL, LDLDIRECT in the last 72 hours. Thyroid Function Tests: Recent Labs    11/01/17 1520  TSH 1.319  FREET4 0.97   Anemia Panel: No results for input(s): VITAMINB12, FOLATE, FERRITIN, TIBC, IRON, RETICCTPCT in the last 72 hours. Sepsis Labs: No results for input(s): PROCALCITON, LATICACIDVEN in the last 168 hours.  Recent Results (from the past 240 hour(s))  Aerobic/Anaerobic Culture (surgical/deep wound)     Status: None   Collection Time: 10/28/17  2:46 PM  Result Value Ref Range Status   Specimen Description ABSCESS ABDOMEN  Final   Special Requests NONE  Final   Gram Stain   Final    NO WBC SEEN RARE GRAM POSITIVE COCCI Performed at [facility], [address], [place], [place] [address]    Culture   Final    MODERATE ACTINOMYCES SPECIES Standardized susceptibility testing for this organism is not available. MIXED ANAEROBIC FLORA PRESENT.  CALL LAB IF FURTHER IID REQUIRED.    Report  Status 11/02/2017 FINAL  Final  C difficile quick scan w PCR reflex     Status: None   Collection Time: 11/03/17  5:32 PM  Result Value Ref Range Status   C Diff antigen NEGATIVE NEGATIVE Final   C Diff toxin NEGATIVE NEGATIVE Final   C Diff interpretation No C. difficile detected.  Final    Comment: Performed at [facility], [place] [address] [place], [place] [address]         Radiology Studies: No results found.      Scheduled Meds: . amoxicillin-clavulanate  1 tablet Oral Q12H  . atorvastatin  20 mg Oral q1800  . DULoxetine  120 mg Oral Daily  . enoxaparin (LOVENOX) injection  65 mg Subcutaneous Q24H  . fentaNYL  75 mcg Transdermal Q72H  . gabapentin  1,200 mg Oral TID  . insulin aspart  0-20 Units Subcutaneous TID WC  . insulin aspart  0-5 Units Subcutaneous QHS  . insulin aspart  15 Units Subcutaneous TID WC  . insulin glargine  65 Units Subcutaneous Daily  . levothyroxine  125 mcg Oral QAC breakfast  . lidocaine  2 patch  Transdermal Q24H  . methocarbamol  1,000 mg Oral TID  . pantoprazole  80 mg Oral Daily   Continuous Infusions: . magnesium sulfate 1 - 4 g bolus IVPB       LOS: 7 days    Time spent: 35 minutes    Irine Seal, MD [facility] Pager [phone]  If 7PM-7AM, please contact night-coverage [email or web address] Password [facility] 11/04/2017, 12:23 PM

## 2017-11-04 NOTE — Progress Notes (Signed)
7 Days Post-Op   Subjective/Chief Complaint: Feeling better Pain with dressing changes improving Still with some loose BM's   Objective: Vital signs in last 24 hours: Temp:  [98.5 F (36.9 C)-99 F (37.2 C)] 98.7 F (37.1 C) (08/13 0520) Pulse Rate:  [72] 72 (08/13 0520) Resp:  [16] 16 (08/13 0520) BP: (108-110)/(61-71) 108/61 (08/13 0520) SpO2:  [92 %-98 %] 92 % (08/13 0520) Last BM Date: 11/03/17  Intake/Output from previous day: 08/12 0701 - 08/13 0700 In: 360 [P.O.:360] Out: -  Intake/Output this shift: Total I/O In: 120 [P.O.:120] Out: -   Exam: Looks comfortable Abdomen soft, wound clean  Lab Results:  Recent Labs    11/03/17 0952 11/04/17 0436  WBC 8.5 8.5  HGB 12.1 11.5*  HCT 37.3 35.3*  PLT 225 210   BMET Recent Labs    11/03/17 0952 11/04/17 0436  NA 138 139  K 4.0 4.1  CL 104 105  CO2 26 26  GLUCOSE 435* 281*  BUN 14 14  CREATININE 1.00 0.98  CALCIUM 8.8* 8.9   PT/INR No results for input(s): LABPROT, INR in the last 72 hours. ABG No results for input(s): PHART, HCO3 in the last 72 hours.  Invalid input(s): PCO2, PO2  Studies/Results: No results found.  Anti-infectives: Anti-infectives (From admission, onward)   Start     Dose/Rate Route Frequency Ordered Stop   11/01/17 1000  amoxicillin-clavulanate (AUGMENTIN) 875-125 MG per tablet 1 tablet     1 tablet Oral Every 12 hours 11/01/17 0742     10/30/17 1030  doxycycline (VIBRA-TABS) tablet 100 mg  Status:  Discontinued     100 mg Oral Every 12 hours 10/30/17 1010 11/01/17 0742   10/29/17 1300  clindamycin (CLEOCIN) IVPB 600 mg  Status:  Discontinued     600 mg 100 mL/hr over 30 Minutes Intravenous Every 8 hours 10/29/17 1159 10/30/17 1010   10/29/17 0800  vancomycin (VANCOCIN) 1,250 mg in sodium chloride 0.9 % 250 mL IVPB  Status:  Discontinued     1,250 mg 166.7 mL/hr over 90 Minutes Intravenous Every 12 hours 10/28/17 1829 10/30/17 1010   10/28/17 1930  vancomycin (VANCOCIN)  1,500 mg in sodium chloride 0.9 % 500 mL IVPB     1,500 mg 250 mL/hr over 120 Minutes Intravenous NOW 10/28/17 1644 10/28/17 2153   10/28/17 1800  clindamycin (CLEOCIN) IVPB 600 mg  Status:  Discontinued     600 mg 100 mL/hr over 30 Minutes Intravenous Every 6 hours 10/28/17 1644 10/29/17 1159   10/27/17 1830  ciprofloxacin (CIPRO) IVPB 400 mg  Status:  Discontinued     400 mg 200 mL/hr over 60 Minutes Intravenous Every 12 hours 10/27/17 1817 10/28/17 1644   10/27/17 1830  metroNIDAZOLE (FLAGYL) IVPB 500 mg  Status:  Discontinued     500 mg 100 mL/hr over 60 Minutes Intravenous Every 8 hours 10/27/17 1817 10/28/17 1644      Assessment/Plan: s/p Procedure(s): INCISION AND DRAINAGE UMBILICAL HERNIA ABSCESS (N/A)   Appreciate Hospitalist's care as well as the diabetic coordinator  C.diff neg so will d/c precautions and try Imodium Continue wound care Hopefully home in the next 24 to 48 hours   LOS: 7 days    [name] A 11/04/2017

## 2017-11-05 DIAGNOSIS — E1165 Type 2 diabetes mellitus with hyperglycemia: Secondary | ICD-10-CM

## 2017-11-05 DIAGNOSIS — E039 Hypothyroidism, unspecified: Secondary | ICD-10-CM

## 2017-11-05 DIAGNOSIS — E118 Type 2 diabetes mellitus with unspecified complications: Secondary | ICD-10-CM

## 2017-11-05 LAB — BASIC METABOLIC PANEL
Anion gap: 9 (ref 5–15)
BUN: 17 mg/dL (ref 6–20)
CO2: 23 mmol/L (ref 22–32)
Calcium: 8.9 mg/dL (ref 8.9–10.3)
Chloride: 104 mmol/L (ref 98–111)
Creatinine, Ser: 0.98 mg/dL (ref 0.44–1.00)
GFR calc Af Amer: >60 mL/min (ref >60)
GFR calc non Af Amer: >60 mL/min (ref >60)
Glucose, Bld: 327 mg/dL — ABNORMAL HIGH (ref 70–99)
Potassium: 4.4 mmol/L (ref 3.5–5.1)
Sodium: 136 mmol/L (ref 135–145)

## 2017-11-05 LAB — CBC
HCT: 37.6 % (ref 36.0–46.0)
Hemoglobin: 11.9 g/dL — ABNORMAL LOW (ref 12.0–15.0)
MCH: 29.8 pg (ref 26.0–34.0)
MCHC: 31.6 g/dL (ref 30.0–36.0)
MCV: 94 fL (ref 78.0–100.0)
Platelets: 226 10*3/uL (ref 150–400)
RBC: 4 MIL/uL (ref 3.87–5.11)
RDW: 12.6 % (ref 11.5–15.5)
WBC: 8 10*3/uL (ref 4.0–10.5)

## 2017-11-05 LAB — GLUCOSE, CAPILLARY
Glucose-Capillary: 260 mg/dL — ABNORMAL HIGH (ref 70–99)
Glucose-Capillary: 233 mg/dL — ABNORMAL HIGH (ref 70–99)
Glucose-Capillary: 242 mg/dL — ABNORMAL HIGH (ref 70–99)
Glucose-Capillary: 131 mg/dL — ABNORMAL HIGH (ref 70–99)

## 2017-11-05 LAB — MAGNESIUM: Magnesium: 2.1 mg/dL (ref 1.7–2.4)

## 2017-11-05 MED ORDER — INSULIN ASPART 100 UNIT/ML ~~LOC~~ SOLN
18.0000 [IU] | Freq: Three times a day (TID) | SUBCUTANEOUS | Status: DC
  Administered 2017-11-05 – 2017-11-06 (×4): 18 [IU] via SUBCUTANEOUS

## 2017-11-05 MED ORDER — FENTANYL 50 MCG/HR TD PT72
75.0000 ug | MEDICATED_PATCH | TRANSDERMAL | Status: DC
  Administered 2017-11-06: 75 ug via TRANSDERMAL
  Filled 2017-11-05: qty 1

## 2017-11-05 NOTE — Progress Notes (Signed)
PT Cancellation Note  Patient Details Name: [name] MRN: [id number] DOB: 25-Oct-1970   Cancelled Treatment:    Reason Eval/Treat Not Completed: (P) Pain limiting ability to participate(Pt reports she is having back spasms and requesting pain meds.  She reports today is not a good day and has also refused mobility tech for ambulation.  Will continue efforts per POC.  )   Arline Ketter Eli Hose 11/05/2017, 3:32 PM  Governor [place], PTA pager [phone],

## 2017-11-05 NOTE — Progress Notes (Signed)
8 Days Post-Op   Subjective/Chief Complaint: Diarrhea mildly improved   Objective: Vital signs in last 24 hours: Temp:  [98.2 F (36.8 C)-98.5 F (36.9 C)] 98.5 F (36.9 C) (08/14 0427) Pulse Rate:  [75-77] 75 (08/14 0427) Resp:  [16] 16 (08/14 0427) BP: (102-111)/(59-76) 108/65 (08/14 0427) SpO2:  [97 %-100 %] 97 % (08/14 0427) Last BM Date: 11/04/17  Intake/Output from previous day: 08/13 0701 - 08/14 0700 In: 105.7 [IV Piggyback:105.7] Out: -  Intake/Output this shift: No intake/output data recorded.  Exam: Abdomen soft, wound clean  Lab Results:  Recent Labs    11/04/17 0436 11/05/17 0333  WBC 8.5 8.0  HGB 11.5* 11.9*  HCT 35.3* 37.6  PLT 210 226   BMET Recent Labs    11/04/17 0436 11/05/17 0333  NA 139 136  K 4.1 4.4  CL 105 104  CO2 26 23  GLUCOSE 281* 327*  BUN 14 17  CREATININE 0.98 0.98  CALCIUM 8.9 8.9   PT/INR No results for input(s): LABPROT, INR in the last 72 hours. ABG No results for input(s): PHART, HCO3 in the last 72 hours.  Invalid input(s): PCO2, PO2  Studies/Results: No results found.  Anti-infectives: Anti-infectives (From admission, onward)   Start     Dose/Rate Route Frequency Ordered Stop   11/01/17 1000  amoxicillin-clavulanate (AUGMENTIN) 875-125 MG per tablet 1 tablet     1 tablet Oral Every 12 hours 11/01/17 0742     10/30/17 1030  doxycycline (VIBRA-TABS) tablet 100 mg  Status:  Discontinued     100 mg Oral Every 12 hours 10/30/17 1010 11/01/17 0742   10/29/17 1300  clindamycin (CLEOCIN) IVPB 600 mg  Status:  Discontinued     600 mg 100 mL/hr over 30 Minutes Intravenous Every 8 hours 10/29/17 1159 10/30/17 1010   10/29/17 0800  vancomycin (VANCOCIN) 1,250 mg in sodium chloride 0.9 % 250 mL IVPB  Status:  Discontinued     1,250 mg 166.7 mL/hr over 90 Minutes Intravenous Every 12 hours 10/28/17 1829 10/30/17 1010   10/28/17 1930  vancomycin (VANCOCIN) 1,500 mg in sodium chloride 0.9 % 500 mL IVPB     1,500 mg 250  mL/hr over 120 Minutes Intravenous NOW 10/28/17 1644 10/28/17 2153   10/28/17 1800  clindamycin (CLEOCIN) IVPB 600 mg  Status:  Discontinued     600 mg 100 mL/hr over 30 Minutes Intravenous Every 6 hours 10/28/17 1644 10/29/17 1159   10/27/17 1830  ciprofloxacin (CIPRO) IVPB 400 mg  Status:  Discontinued     400 mg 200 mL/hr over 60 Minutes Intravenous Every 12 hours 10/27/17 1817 10/28/17 1644   10/27/17 1830  metroNIDAZOLE (FLAGYL) IVPB 500 mg  Status:  Discontinued     500 mg 100 mL/hr over 60 Minutes Intravenous Every 8 hours 10/27/17 1817 10/28/17 1644      Assessment/Plan: s/p Procedure(s): INCISION AND DRAINAGE UMBILICAL HERNIA ABSCESS (N/A)  Continuing wound care Oral antibiotics Biggest issue remains glucose control.  Plan discharge when this is under better control.  Again, Hospitalist's care appreciated  LOS: 8 days    Petr Bontempo A 11/05/2017

## 2017-11-05 NOTE — Progress Notes (Signed)
Inpatient Diabetes Program Recommendations  AACE/ADA: New Consensus Statement on Inpatient Glycemic Control (2019)  Target Ranges:  Prepandial:   less than 140 mg/dL      Peak postprandial:   less than 180 mg/dL (1-2 hours)      Critically ill patients:  140 - 180 mg/dL   Results for [name], [name] (MRN [id number]) as of 11/05/2017 11:32  Ref. Range 11/04/2017 08:20 11/04/2017 12:50 11/04/2017 14:26 11/04/2017 17:08 11/04/2017 20:57 11/05/2017 08:31  Glucose-Capillary Latest Ref Range: 70 - 99 mg/dL 354 (H)  Novolog 35 units  Lantus 65 units 213 (H)  Novolog 22 units   129 (H)     Lantus 5 units 207 (H)  Novolog 22 units 223 (H)  Novolog 2 units 260 (H)  Novolog 26 units   Review of Glycemic Control  Diabetes history: DM2 Outpatient Diabetes medications: Lantus 44 units QHS, Januvia 50 mg daily, Victoza 1.8 mg daily Current orders for Inpatient glycemic control: Lantus 35 units BID, Novolog 15 units TID with meals, Novolog 0-20 units TID with meals, Novolog 0-5 units QHS  Inpatient Diabetes Program Recommendations:  Insulin - Basal: Patient received a total of Lantus 70 units on 11/04/17.   Noted Lantus was increased and split into 2 doses (35 units BID) on 11/04/17 to start this morning at 10:00 am.  Insulin - Meal Coverage: Please consider increasing meal coverage to Novolog 22 units TID with meals. HgbA1C: A1C 8.7% on 10/28/17 indicating an average glucose of 203 mg/dl over the past 2-3 months.  Thanks, Barnie Alderman, RN, MSN, CDE Diabetes Coordinator Inpatient Diabetes Program [phone] (Team Pager from 8am to 5pm)

## 2017-11-05 NOTE — Progress Notes (Signed)
PROGRESS NOTE        PATIENT DETAILS Name: [name] Age: 47 y.o. Sex: female Date of Birth: 07/11/1970 Admit Date: 10/27/2017 Admitting Physician Coralie Keens, MD DJM:EQAST, Ripley Fraise, NP  Brief Narrative: Patient is a 47 y.o. female poorly controlled diabetes, obesity who recently underwent a complicated hernia repair with mesh on 7/25, subsequently had a large hematoma evacuated at the operative site, patient subsequently developed abdominal pain and weakness and foul-smelling material at the operative site, she was found to have a umbilical abscess and underwent irrigation and drainage by general surgery on 8/6.  Hospitalist service was asked to manage uncontrolled diabetes.  See below for further details  Subjective: No major issues overnight-lying comfortably in bed.  Assessment/Plan: Uncontrolled type 2 diabetes with hyperglycemia: CBGs much better-just changed to 35 units of Lantus twice daily-2 early to make further changes at this point-except to increase pre-meal NovoLog to 18 units.  Her CBGs will take several days to optimize-but this can be done in the outpatient setting.  Hypothyroidism: Continue with Synthroid  Morbid obesity  Post operative abscess: Per primary team  DVT Prophylaxis: Prophylactic Lovenox   Code Status: Full code  Family Communication: None at bedside  Disposition Plan: Per primary service  Antimicrobial agents: Anti-infectives (From admission, onward)   Start     Dose/Rate Route Frequency Ordered Stop   11/01/17 1000  amoxicillin-clavulanate (AUGMENTIN) 875-125 MG per tablet 1 tablet     1 tablet Oral Every 12 hours 11/01/17 0742     10/30/17 1030  doxycycline (VIBRA-TABS) tablet 100 mg  Status:  Discontinued     100 mg Oral Every 12 hours 10/30/17 1010 11/01/17 0742   10/29/17 1300  clindamycin (CLEOCIN) IVPB 600 mg  Status:  Discontinued     600 mg 100 mL/hr over 30 Minutes Intravenous Every 8  hours 10/29/17 1159 10/30/17 1010   10/29/17 0800  vancomycin (VANCOCIN) 1,250 mg in sodium chloride 0.9 % 250 mL IVPB  Status:  Discontinued     1,250 mg 166.7 mL/hr over 90 Minutes Intravenous Every 12 hours 10/28/17 1829 10/30/17 1010   10/28/17 1930  vancomycin (VANCOCIN) 1,500 mg in sodium chloride 0.9 % 500 mL IVPB     1,500 mg 250 mL/hr over 120 Minutes Intravenous NOW 10/28/17 1644 10/28/17 2153   10/28/17 1800  clindamycin (CLEOCIN) IVPB 600 mg  Status:  Discontinued     600 mg 100 mL/hr over 30 Minutes Intravenous Every 6 hours 10/28/17 1644 10/29/17 1159   10/27/17 1830  ciprofloxacin (CIPRO) IVPB 400 mg  Status:  Discontinued     400 mg 200 mL/hr over 60 Minutes Intravenous Every 12 hours 10/27/17 1817 10/28/17 1644   10/27/17 1830  metroNIDAZOLE (FLAGYL) IVPB 500 mg  Status:  Discontinued     500 mg 100 mL/hr over 60 Minutes Intravenous Every 8 hours 10/27/17 1817 10/28/17 1644      Time spent: 25-minutes-Greater than 50% of this time was spent in counseling, explanation of diagnosis, planning of further management, and coordination of care.  MEDICATIONS: Scheduled Meds: . amoxicillin-clavulanate  1 tablet Oral Q12H  . atorvastatin  20 mg Oral q1800  . DULoxetine  120 mg Oral Daily  . enoxaparin (LOVENOX) injection  65 mg Subcutaneous Q24H  . fentaNYL  75 mcg Transdermal Q72H  . gabapentin  1,200 mg Oral  TID  . insulin aspart  0-20 Units Subcutaneous TID WC  . insulin aspart  0-5 Units Subcutaneous QHS  . insulin aspart  15 Units Subcutaneous TID WC  . insulin glargine  35 Units Subcutaneous BID  . levothyroxine  125 mcg Oral QAC breakfast  . lidocaine  2 patch Transdermal Q24H  . methocarbamol  1,000 mg Oral TID  . pantoprazole  80 mg Oral Daily   Continuous Infusions: PRN Meds:.acetaminophen, ALPRAZolam, diphenhydrAMINE **OR** diphenhydrAMINE, eletriptan, HYDROmorphone (DILAUDID) injection, HYDROmorphone, hydrOXYzine, loperamide, ondansetron (ZOFRAN) IV,  oxyCODONE, senna   PHYSICAL EXAM: Vital signs: Vitals:   11/04/17 1457 11/04/17 2104 11/05/17 0427 11/05/17 1437  BP: (!) 102/59 111/76 108/65 93/63  Pulse: 76 77 75 75  Resp: 16 16 16 16   Temp: 98.2 F (36.8 C) 98.2 F (36.8 C) 98.5 F (36.9 C) 98.7 F (37.1 C)  TempSrc: Oral Oral Oral Oral  SpO2: 98% 100% 97% 98%  Weight:      Height:       Filed Weights   11/02/17 1624  Weight: (!) 139.7 kg   Body mass index is 49.71 kg/m.   General appearance :Awake, alert, not in any distress. Eyes:Pink conjunctiva HEENT: Atraumatic and Normocephalic Neck: supple, no JVD. No cervical lymphadenopathy. No thyromegaly Resp:Good air entry bilaterally, no added sounds  CVS: S1 S2 regular, no murmurs.  GI: Bowel sounds present, obese-mildly tender but no peritoneal signs. Extremities: B/L Lower Ext shows no edema, both legs are warm to touch Neurology:  speech clear,Non focal, sensation is grossly intact. Psychiatric: Normal judgment and insight. Alert and oriented x 3. Normal mood. Musculoskeletal:No digital cyanosis Skin:No Rash, warm and dry Wounds:N/A  I have personally reviewed following labs and imaging studies  LABORATORY DATA: CBC: Recent Labs  Lab 10/30/17 0415 10/31/17 0434 11/03/17 0952 11/04/17 0436 11/05/17 0333  WBC 7.6 6.4 8.5 8.5 8.0  NEUTROABS  --  3.3 5.3 5.0  --   HGB 11.0* 10.6* 12.1 11.5* 11.9*  HCT 34.4* 34.4* 37.3 35.3* 37.6  MCV 93.7 95.3 92.3 92.2 94.0  PLT 244 228 225 210 027    Basic Metabolic Panel: Recent Labs  Lab 10/31/17 0434 11/01/17 0549 11/03/17 0952 11/04/17 0436 11/05/17 0333  NA 139 139 138 139 136  K 4.1 4.1 4.0 4.1 4.4  CL 106 107 104 105 104  CO2 23 27 26 26 23   GLUCOSE 616* 322* 435* 281* 327*  BUN 13 13 14 14 17   CREATININE 1.13* 0.91 1.00 0.98 0.98  CALCIUM 8.4* 8.5* 8.8* 8.9 8.9  MG  --   --  1.5* 1.7 2.1    GFR: Estimated Creatinine Clearance: 100.3 mL/min (by C-G formula based on SCr of 0.98 mg/dL).  Liver  Function Tests: No results for input(s): AST, ALT, ALKPHOS, BILITOT, PROT, ALBUMIN in the last 168 hours. No results for input(s): LIPASE, AMYLASE in the last 168 hours. No results for input(s): AMMONIA in the last 168 hours.  Coagulation Profile: No results for input(s): INR, PROTIME in the last 168 hours.  Cardiac Enzymes: No results for input(s): CKTOTAL, CKMB, CKMBINDEX, TROPONINI in the last 168 hours.  BNP (last 3 results) No results for input(s): PROBNP in the last 8760 hours.  HbA1C: No results for input(s): HGBA1C in the last 72 hours.  CBG: Recent Labs  Lab 11/04/17 1426 11/04/17 1708 11/04/17 2057 11/05/17 0831 11/05/17 1224  GLUCAP 129* 207* 223* 260* 233*    Lipid Profile: No results for input(s): CHOL, HDL, LDLCALC, TRIG,  CHOLHDL, LDLDIRECT in the last 72 hours.  Thyroid Function Tests: No results for input(s): TSH, T4TOTAL, FREET4, T3FREE, THYROIDAB in the last 72 hours.  Anemia Panel: No results for input(s): VITAMINB12, FOLATE, FERRITIN, TIBC, IRON, RETICCTPCT in the last 72 hours.  Urine analysis:    Component Value Date/Time   COLORURINE YELLOW 09/05/2012 1141   APPEARANCEUR CLEAR 09/05/2012 1141   LABSPEC 1.016 09/05/2012 1141   PHURINE 5.5 09/05/2012 1141   GLUCOSEU NEGATIVE 09/05/2012 1141   HGBUR NEGATIVE 09/05/2012 1141   BILIRUBINUR NEGATIVE 09/05/2012 1141   KETONESUR NEGATIVE 09/05/2012 1141   PROTEINUR NEGATIVE 09/05/2012 1141   UROBILINOGEN 0.2 09/05/2012 1141   NITRITE NEGATIVE 09/05/2012 1141   LEUKOCYTESUR NEGATIVE 09/05/2012 1141    Sepsis Labs: Lactic Acid, Venous    Component Value Date/Time   LATICACIDVEN 0.5 05/14/2008 0400    MICROBIOLOGY: Recent Results (from the past 240 hour(s))  Aerobic/Anaerobic Culture (surgical/deep wound)     Status: None   Collection Time: 10/28/17  2:46 PM  Result Value Ref Range Status   Specimen Description ABSCESS ABDOMEN  Final   Special Requests NONE  Final   Gram Stain   Final     NO WBC SEEN RARE GRAM POSITIVE COCCI Performed at [facility], [place] [address] [place], [place] [address]    Culture   Final    MODERATE ACTINOMYCES SPECIES Standardized susceptibility testing for this organism is not available. MIXED ANAEROBIC FLORA PRESENT.  CALL LAB IF FURTHER IID REQUIRED.    Report Status 11/02/2017 FINAL  Final  C difficile quick scan w PCR reflex     Status: None   Collection Time: 11/03/17  5:32 PM  Result Value Ref Range Status   C Diff antigen NEGATIVE NEGATIVE Final   C Diff toxin NEGATIVE NEGATIVE Final   C Diff interpretation No C. difficile detected.  Final    Comment: Performed at [facility], [place] [address], [place], [place] [address]    RADIOLOGY STUDIES/RESULTS: Ct Abdomen Pelvis W Contrast  Result Date: 10/28/2017 CLINICAL DATA:  Umbilical hernia repair on 10/16/2017 with mesh after long standing wound. Diabetic patient was seen last week and that of a hematoma and placed on antibiotics. Currently the patient is tachycardic with foul-smelling drainage from lower post portion of the incision. EXAM: CT ABDOMEN AND PELVIS WITH CONTRAST TECHNIQUE: Multidetector CT imaging of the abdomen and pelvis was performed using the standard protocol following bolus administration of intravenous contrast. CONTRAST:  162mL OMNIPAQUE IOHEXOL 300 MG/ML  SOLN COMPARISON:  None. FINDINGS: LOWER CHEST: The included heart is top-normal in size without pericardial effusion. Subsegmental atelectasis is seen in the right lower lobe. No effusion or pulmonary consolidation. HEPATOBILIARY: Homogeneous attenuation of the liver without space-occupying mass. Mild steatosis of the liver is noted. The patient is status post cholecystectomy. There is no biliary dilatation. PANCREAS: Normal SPLEEN: Normal ADRENALS/URINARY TRACT: Normal bilateral adrenal glands. Symmetric cortical enhancement of both kidneys. No enhancing mass lesions of either kidney. No obstructive uropathy. The  urinary bladder is normal. STOMACH/BOWEL: The stomach, small and large bowel are normal in course and caliber without inflammatory changes. Normal appendix. VASCULAR/LYMPHATIC: Aortoiliac vessels are normal in course and caliber. No lymphadenopathy by CT size criteria. REPRODUCTIVE: Normal. OTHER: No drainable fluid collection or abscess is noted. No communication with underlying bowel is seen at site of umbilical hernia repair. Inflammatory phlegmonous tissue is noted surrounding the umbilical soft tissue wound as well overlying the peritoneal surface. Induration of  the mesenteric fat deep to the umbilicus is also noted likely representing necrosis of fat measures approximately 5.9 x 5.8 5.2 cm. MUSCULOSKELETAL: Calcified subcutaneous fatty mass is noted consistent with fat necrosis measuring 7.8 x 3.8 x 9.8 cm involving the right gluteal subcutaneous fat. IMPRESSION: 1. Status post umbilical hernia repair without recurrence of hernia noted. 2. Inflammatory thickening at site of previous umbilical fat containing hernia is noted without drainable fluid collection nor abscess. No enterocutaneous fistula is seen. 3. Mesenteric fat necrosis deep to the site of umbilical hernia repair. 4. Left gluteal fat necrosis is also noted in the subcutaneous soft tissues. 5. Hepatic steatosis is identified. Electronically Signed   By: Ashley Royalty M.D.   On: 10/28/2017 02:41     LOS: 8 days   Oren Binet, MD  [facility]  If 7PM-7AM, please contact night-coverage  Please page via [email or web address]-Password [email or web address]-click on MD name and type text message  11/05/2017, 5:08 PM

## 2017-11-06 LAB — GLUCOSE, CAPILLARY
Glucose-Capillary: 217 mg/dL — ABNORMAL HIGH (ref 70–99)
Glucose-Capillary: 228 mg/dL — ABNORMAL HIGH (ref 70–99)
Glucose-Capillary: 265 mg/dL — ABNORMAL HIGH (ref 70–99)

## 2017-11-06 MED ORDER — INSULIN GLARGINE 100 UNIT/ML ~~LOC~~ SOLN: 44.0000 [IU] | Freq: Every day | SUBCUTANEOUS | 11 refills | Status: DC

## 2017-11-06 MED ORDER — INSULIN GLARGINE 100 UNIT/ML ~~LOC~~ SOLN
40.0000 [IU] | Freq: Every day | SUBCUTANEOUS | Status: DC
  Filled 2017-11-06: qty 0.4

## 2017-11-06 MED ORDER — INSULIN GLARGINE 100 UNIT/ML ~~LOC~~ SOLN: 35.0000 [IU] | Freq: Two times a day (BID) | SUBCUTANEOUS | 11 refills | Status: DC

## 2017-11-06 MED ORDER — SULFAMETHOXAZOLE-TRIMETHOPRIM 800-160 MG PO TABS: 1.0000 | ORAL_TABLET | Freq: Two times a day (BID) | ORAL | 0 refills | Status: DC

## 2017-11-06 MED ORDER — INSULIN GLARGINE 100 UNIT/ML ~~LOC~~ SOLN: 35.0000 [IU] | Freq: Every morning | SUBCUTANEOUS | Status: DC

## 2017-11-06 MED ORDER — HYDROMORPHONE HCL 4 MG PO TABS: 4.0000 mg | ORAL_TABLET | Freq: Four times a day (QID) | ORAL | 0 refills | Status: DC | PRN

## 2017-11-06 NOTE — Care Management (Signed)
1529 11-06-17 Patient will have [place] via [facility]- Registered Nurse, Physical and Occupational Therapies. Dan with [facility] is aware and SOC to begin within 24-48 hours of transition home. No further needs from CM at this time. Bethena Roys, RN, BSN [phone]

## 2017-11-06 NOTE — Progress Notes (Signed)
Pt discharged to home. Pt. Is alert and oriented. Pt is hemodynamically stable. AVS reviewed with pt. Capable of re verbalizing medication regimen. Discharge plan appropriate and in place. 

## 2017-11-06 NOTE — Progress Notes (Signed)
Patient ID: [name], female   DOB: 12-05-1970, 47 y.o.   MRN: [id number]  She feels much better today Diarrhea less Pain controlled  Abdomen soft, wound clean  Plan: D/c today with continued wound care and close follow-up with PCP

## 2017-11-06 NOTE — Progress Notes (Signed)
Waiting for pt to get out of the shower so that husband can pack her wound and discharge can proceed.

## 2017-11-06 NOTE — Discharge Summary (Signed)
Physician Discharge Summary  Patient ID: [name] MRN: [id number] DOB/AGE: [birth year] 47 y.o.  Admit date: 10/27/2017 Discharge date: 11/06/2017  Admission Diagnoses:  Discharge Diagnoses:  Principal Problem:   Diabetes mellitus type 2 with complications, uncontrolled ([place]) Active Problems:   Morbidly obese ([facility])   Hypothyroidism   Postoperative abscess   Discharged Condition: good  Hospital Course: admitted with wound infection.  Taken to OR for wash out of midline wound.  Wound packing started.  Developed diarrhea post op.  C.diff was negative.  Hospitalists asked to see patient regarding diabetes.  Discharged home once cbg's under control  Consults: [facility]  Significant Diagnostic Studies:     Discharge Exam: Blood pressure 107/78, pulse 83, temperature 98 F (36.7 C), temperature source Oral, resp. rate (!) 22, height _0  (1.676 m), weight (!) 139.7 kg, SpO2 98 %. General appearance: alert, cooperative and no distress Resp: clear to auscultation bilaterally Cardio: regular rate and rhythm, S1, S2 normal, no murmur, click, rub or gallop Incision/Wound: abdomen soft, non-tender, wound clean  Disposition: Discharge disposition: 01-Home or Self Care        Allergies as of 11/06/2017      Reactions   Penicillins Hives, Other (See Comments)   HAS PT DEVELOPED SEVERE RASH INVOLVING MUCUS MEMBRANES or SKIN NECROSIS: #  #  YES  #  # PATIENT HAS HAD A PCN REACTION THAT REQUIRED HOSPITALIZATION:  #  #  YES  #  #  Tolerates amoxicillin on multiple occasions per Dr. Darrel Hoover note 11/01/17.  TDD.   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   ALPRAZolam 1 MG tablet Commonly known as:  XANAX Take 1 mg by mouth 2 (two) times daily as needed for anxiety.   atorvastatin 20 MG tablet Commonly known as:  LIPITOR Take 20 mg by mouth daily.   Biotin 10000 MCG Tabs Take 10,000 mcg by mouth daily.   carisoprodol 350 MG tablet Commonly known as:  SOMA Take 350 mg by mouth 5 (five) times daily as needed for muscle spasms.   cetirizine 10 MG tablet Commonly known as:  ZYRTEC Take 10 mg by mouth daily as needed for allergies.   DULoxetine 60 MG capsule Commonly known as:  CYMBALTA Take 120 mg by mouth daily.   eletriptan 40 MG tablet Commonly known as:  RELPAX Take 40 mg by mouth as needed for migraine or headache. May repeat in 2 hours if headache persists or recurs.   esomeprazole 40 MG capsule Commonly known as:  NEXIUM Take 40 mg by mouth daily before breakfast. Name Brand Only   fentaNYL 75 MCG/HR Commonly known as:  DURAGESIC - dosed mcg/hr Place 75 mcg onto the skin every 3 (three) days.   furosemide 20 MG tablet Commonly known as:  LASIX Take 20 mg by mouth daily as needed for fluid.   gabapentin 600 MG tablet Commonly known as:  NEURONTIN Take 1,200 mg by mouth 3 (three) times  daily.   HYDROmorphone 4 MG tablet Commonly known as:  DILAUDID Take 1 tablet (4 mg total) by mouth every 6 (six) hours as needed for severe pain. What changed:  Another medication with the same name was added. Make sure you understand how and when to take each.   HYDROmorphone 4 MG tablet Commonly known as:  DILAUDID Take 1 tablet (4 mg total) by mouth every 6 (six) hours as needed for moderate pain. What changed:  You were already taking a medication with the same name, and this prescription was added. Make sure you understand how and when to take each.   hydrOXYzine 25 MG capsule Commonly known as:   VISTARIL Take 25 mg by mouth 3 (three) times daily as needed for anxiety.   insulin glargine 100 UNIT/ML injection Commonly known as:  LANTUS Inject 0.44 mLs (44 Units total) into the skin at bedtime. What changed:  Another medication with the same name was added. Make sure you understand how and when to take each.   insulin glargine 100 UNIT/ML injection Commonly known as:  LANTUS Inject 0.35 mLs (35 Units total) into the skin 2 (two) times daily. What changed:  You were already taking a medication with the same name, and this prescription was added. Make sure you understand how and when to take each.   levothyroxine 125 MCG tablet Commonly known as:  SYNTHROID, LEVOTHROID Take 125 mcg by mouth daily.   lidocaine 5 % Commonly known as:  LIDODERM Place 1-3 patches onto the skin daily. ONE PATCH ON EACH KNEE AND ONE PATCH ON THE LOWER BACK   milk thistle 175 MG tablet Take 350 mg by mouth daily.   NARCAN 4 MG/0.1ML Liqd nasal spray kit Generic drug:  naloxone PLACE 1 SPRAY IN 1 NOSTRIL FOR ACCIDENTAL OVERDOSE. MAY REPEAT Q 2 TO 3 MIN IF PATIENT UNRESPONSIVE.   nystatin powder Generic drug:  nystatin Apply 1 g topically daily as needed (blemishes).   omega-3 acid ethyl esters 1 g capsule Commonly known as:  LOVAZA Take 2 g by mouth daily.   oxyCODONE 15 MG immediate release tablet Commonly known as:  ROXICODONE Take 15 mg by mouth every 6 (six) hours as needed (MODERATE PAIN).   phentermine 37.5 MG tablet Commonly known as:  ADIPEX-P Take 37.5 mg by mouth daily before breakfast.   PRENATAL PLUS PO Take 1 tablet by mouth daily.   promethazine 25 MG tablet Commonly known as:  PHENERGAN Take 25 mg by mouth every 6 (six) hours as needed for nausea or vomiting.   senna 8.6 MG Tabs tablet Commonly known as:  SENOKOT Take 8.6 mg by mouth daily as needed for mild constipation.   sitaGLIPtin 50 MG tablet Commonly known as:  JANUVIA Take 50 mg by mouth daily.    sulfamethoxazole-trimethoprim 800-160 MG tablet Commonly known as:  BACTRIM DS,SEPTRA DS Take 1 tablet by mouth 2 (two) times daily.   SUMAtriptan 100 MG tablet Commonly known as:  IMITREX Take 100 mg by mouth daily as needed for migraine.   topiramate 200 MG tablet Commonly known as:  TOPAMAX Take 200 mg by mouth 2 (two) times daily.   triamcinolone cream 0.1 % Commonly known as:  KENALOG Apply 1 application topically 3 (three) times daily as needed for rash.   VICTOZA 18 MG/3ML Soln injection Generic drug:  Liraglutide Inject 1.8 mg into the skin every morning.   VITAMIN B-12 IJ Inject 1 each as directed every 30 (thirty) days.  Follow-up Information    Coralie Keens, MD. Go on 11/06/2017.   Specialty:  General Surgery Why:  Your appointment is scheduled for 9:50 AM. Please arrive 30 min prior to appointment time. Bring photo ID and insurance information.  Contact information: [address] [place] [address] [phone]        Pecolia Ades, NP. Call in 1 week(s).   Specialty:  Adult Health Nurse Practitioner Contact information: [address] [address] [phone]           Signed: Harl Bowie 11/06/2017, 7:48 AM

## 2017-11-06 NOTE — Progress Notes (Signed)
Physical Therapy Treatment Patient Details Name: [name] MRN: [id number] DOB: 1970-06-24 Today's Date: 11/06/2017    History of Present Illness [name] is a 47 y.o F s/p abdominal abscess repair (10/27/2017) following a umbilical hernia repair with mesh on 10/16/2017. PMH includes T2DM, two spinal surgeries, R drop foot, and arthritis.     PT Comments    Pt mobilizing well and progressing towards goals. Pt very motivated to recover and aware of risks of immobility. Expected to d/c today with supervision provided by significant other.    Follow Up Recommendations  Supervision for mobility/OOB;Home health PT     Equipment Recommendations  None recommended by PT    Recommendations for Other Services OT consult     Precautions / Restrictions Precautions Precautions: Fall Precaution Comments: monitor BP, open abdominal wound Restrictions Weight Bearing Restrictions: No    Mobility  Bed Mobility Overal bed mobility: Needs Assistance Bed Mobility: Supine to Sit;Sit to Supine     Supine to sit: Supervision Sit to supine: Supervision   General bed mobility comments: supervision for safety, no physical assistance required  Transfers Overall transfer level: Needs assistance Equipment used: Rolling walker (2 wheeled) Transfers: Sit to/from Stand Sit to Stand: Supervision         General transfer comment: supervision for safety. Standing slightly prematurely, before RW  was in place.  Ambulation/Gait Ambulation/Gait assistance: Supervision Gait Distance (Feet): 500 Feet Assistive device: Rolling walker (2 wheeled) Gait Pattern/deviations: Step-through pattern;Decreased stride length;Decreased dorsiflexion - right;Steppage Gait velocity: decreased   General Gait Details: Pt overall steady with RW. Steppage gait on R to compensate for foot drop.    Stairs             Wheelchair Mobility    Modified Rankin (Stroke Patients Only)        Balance Overall balance assessment: Needs assistance Sitting-balance support: No upper extremity supported;Feet supported Sitting balance-Leahy Scale: Good     Standing balance support: Bilateral upper extremity supported;Single extremity supported Standing balance-Leahy Scale: Poor Standing balance comment: reliant on UE support during mobility at this time.                             Cognition Arousal/Alertness: Awake/alert Behavior During Therapy: WFL for tasks assessed/performed Overall Cognitive Status: Within Functional Limits for tasks assessed                                 General Comments: Pt is pleasant      Exercises      General Comments General comments (skin integrity, edema, etc.): Pt significant other present during session.      Pertinent Vitals/Pain Pain Assessment: Faces Faces Pain Scale: Hurts whole lot Pain Location: abdomen, back, BIL knees Pain Descriptors / Indicators: Sore;Other (Comment)(Shaking with pain) Pain Intervention(s): Monitored during session;Limited activity within patient's tolerance;Repositioned;Patient requesting pain meds-RN notified    Home Living                      Prior Function            PT Goals (current goals can now be found in the care plan section) Acute Rehab PT Goals Patient Stated Goal: to return home hopefully soon  PT Goal Formulation: With patient Time For Goal Achievement: 11/13/17 Potential to Achieve Goals: Good Progress towards PT goals: Progressing toward goals  Frequency    Min 3X/week      PT Plan Current plan remains appropriate    Co-evaluation              AM-PAC PT "6 Clicks" Daily Activity  Outcome Measure  Difficulty turning over in bed (including adjusting bedclothes, sheets and blankets)?: A Little Difficulty moving from lying on back to sitting on the side of the bed? : A Little Difficulty sitting down on and standing up from a chair  with arms (e.g., wheelchair, bedside commode, etc,.)?: A Little Help needed moving to and from a bed to chair (including a wheelchair)?: A Little Help needed walking in hospital room?: A Little Help needed climbing 3-5 steps with a railing? : A Little 6 Click Score: 18    End of Session   Activity Tolerance: Patient tolerated treatment well Patient left: with call bell/phone within reach;in chair;with family/visitor present Nurse Communication: Mobility status;Patient requests pain meds PT Visit Diagnosis: Unsteadiness on feet (R26.81);Other abnormalities of gait and mobility (R26.89);Muscle weakness (generalized) (M62.81);Difficulty in walking, not elsewhere classified (R26.2);Other symptoms and signs involving the nervous system (R29.898);Dizziness and giddiness (R42);Pain Pain - part of body: (abdomen)     Time: 7564-3329 PT Time Calculation (min) (ACUTE ONLY): 18 min  Charges:  $Gait Training: 8-22 mins                     Benjiman Core, [place] Pager [phone] Acute Rehab   Allena Katz 11/06/2017, 12:09 PM

## 2017-11-06 NOTE — Discharge Instructions (Signed)
WOUND CARE: - dressing to be changed twice daily - supplies: sterile saline, kerlix, scissors, ABD pads, tape  - remove dressing and all packing carefully, moistening with sterile saline as needed to avoid packing/internal dressing sticking to the wound. - clean edges of skin around the wound with water/gauze, making sure there is no tape debris or leakage left on skin that could cause skin irritation or breakdown. - dampen and clean kerlix with sterile saline and pack wound from wound base to skin level, making sure to take note of any possible areas of wound tracking, tunneling and packing appropriately. Wound can be packed loosely. Trim kerlix to size if a whole kerlix is not required. - cover wound with a dry ABD pad and secure with tape.  - write the date/time on the dry dressing/tape to better track when the last dressing change occurred. - apply any skin protectant/powder recommended by clinician to protect skin/skin folds. - change dressing as needed if leakage occurs, wound gets contaminated, or patient requests to shower. - patient may shower daily with wound open and following the shower the wound should be dried and a clean dressing placed.    Wet to dry Normal Saline dressing changes twice daily Call office [phone] for questions

## 2017-11-06 NOTE — Progress Notes (Signed)
RN way pharmacy paper and asked about the medication- pt stated that she got the medication already and did not need RN to retrieve it. Medication is Duragesic patch 75mg - pt states that it was placed on her back.

## 2017-11-19 ENCOUNTER — Ambulatory Visit (INDEPENDENT_AMBULATORY_CARE_PROVIDER_SITE_OTHER): Payer: Medicare HMO | Admitting: Orthopaedic Surgery

## 2017-11-19 DIAGNOSIS — M1711 Unilateral primary osteoarthritis, right knee: Secondary | ICD-10-CM | POA: Diagnosis not present

## 2017-11-19 DIAGNOSIS — M1712 Unilateral primary osteoarthritis, left knee: Secondary | ICD-10-CM

## 2017-11-19 MED ORDER — METHYLPREDNISOLONE ACETATE 40 MG/ML IJ SUSP
40.0000 mg | INTRAMUSCULAR | Status: AC | PRN
  Administered 2017-11-19: 40 mg via INTRA_ARTICULAR

## 2017-11-19 MED ORDER — LIDOCAINE HCL 1 % IJ SOLN
3.0000 mL | INTRAMUSCULAR | Status: AC | PRN
  Administered 2017-11-19: 3 mL

## 2017-11-19 NOTE — Progress Notes (Signed)
   Procedure Note  Patient: [name]             Date of Birth: 1971/03/10           MRN: [id number]             Visit Date: 11/19/2017  Procedures: Visit Diagnoses: Unilateral primary osteoarthritis, left knee  Unilateral primary osteoarthritis, right knee  Large Joint Inj: R knee on 11/19/2017 3:39 PM Indications: diagnostic evaluation and pain Details: 22 G 1.5 in needle, superolateral approach  Arthrogram: No  Medications: 3 mL lidocaine 1 %; 40 mg methylPREDNISolone acetate 40 MG/ML Outcome: tolerated well, no immediate complications Procedure, treatment alternatives, risks and benefits explained, specific risks discussed. Consent was given by the patient. Immediately prior to procedure a time out was called to verify the correct patient, procedure, equipment, support staff and site/side marked as required. Patient was prepped and draped in the usual sterile fashion.   Large Joint Inj: L knee on 11/19/2017 3:39 PM Indications: diagnostic evaluation and pain Details: 22 G 1.5 in needle, superolateral approach  Arthrogram: No  Medications: 3 mL lidocaine 1 %; 40 mg methylPREDNISolone acetate 40 MG/ML Outcome: tolerated well, no immediate complications Procedure, treatment alternatives, risks and benefits explained, specific risks discussed. Consent was given by the patient. Immediately prior to procedure a time out was called to verify the correct patient, procedure, equipment, support staff and site/side marked as required. Patient was prepped and draped in the usual sterile fashion.    The patient is well-known to me.  She is been struggling with abdominal surgery but has had some setbacks.  She also has severe degenerative joint disease in both knees.  She is also struggled with diabetes.  Last hemoglobin A1c from previously was 8.7 and this is improved from as high as 10 recently.  She reports much better blood glucose control and is seeing endocrinologist.  She would  like to have steroid injections in both knees today.  She understands fully this can affect her blood glucose.  Her arthritis is quite severe and we can proceed with knee replacement surgery on at least her right knee in the near future once her blood glucose is under better control and her abdominal wound is healed.  Today she tolerated steroid in both knees well but it was difficult to get them in.  I can repeat this again in 3 months if needed.

## 2018-01-06 ENCOUNTER — Telehealth (INDEPENDENT_AMBULATORY_CARE_PROVIDER_SITE_OTHER): Payer: Self-pay

## 2018-01-06 NOTE — Telephone Encounter (Signed)
Patient called stating that she had acute onset of foot pain bruising and swelling- NKI She was asking for appt. Worked her in to see Dr Junius Roads in the morning. She was very insistent on seeing Dr Joaquin Courts but advised her there was no appointments available

## 2018-01-07 ENCOUNTER — Ambulatory Visit (INDEPENDENT_AMBULATORY_CARE_PROVIDER_SITE_OTHER): Payer: Medicare HMO | Admitting: Family Medicine

## 2018-01-12 ENCOUNTER — Ambulatory Visit (INDEPENDENT_AMBULATORY_CARE_PROVIDER_SITE_OTHER): Payer: Medicare HMO | Admitting: Physician Assistant

## 2018-01-19 ENCOUNTER — Telehealth (INDEPENDENT_AMBULATORY_CARE_PROVIDER_SITE_OTHER): Payer: Self-pay | Admitting: Orthopaedic Surgery

## 2018-01-19 NOTE — Telephone Encounter (Signed)
Patient wants to speak with you regarding her right foot, ankle and knee. The bottom of her foot is going numb and says shes in excruciating pain So this is what she was wanting to speak with you about, she also mentioned maybe getting in sooner this week. # [phone]

## 2018-01-19 NOTE — Telephone Encounter (Signed)
Patient called wanted to speak with Caryl Pina has surgery questions.

## 2018-01-20 NOTE — Telephone Encounter (Signed)
Returned patients call and I can't leave any voicemails as her VM is full

## 2018-01-21 ENCOUNTER — Telehealth (INDEPENDENT_AMBULATORY_CARE_PROVIDER_SITE_OTHER): Payer: Self-pay | Admitting: Orthopaedic Surgery

## 2018-01-21 NOTE — Telephone Encounter (Signed)
Patient called in regard to foot. Very painful and having surgery in January w/[facility].  Advised you tried to call but she stated he VM was not full only has 3 messages on it. Verified # with patient and # is correct.   Please call patient to discuss problem w/foot.

## 2018-01-21 NOTE — Telephone Encounter (Signed)
Patient wanted an earlier appointment and I apologized and let her know I do not have anything that we are completely booked unfortunately

## 2018-01-28 ENCOUNTER — Ambulatory Visit (INDEPENDENT_AMBULATORY_CARE_PROVIDER_SITE_OTHER): Payer: Medicare HMO | Admitting: Orthopaedic Surgery

## 2018-01-28 ENCOUNTER — Ambulatory Visit (INDEPENDENT_AMBULATORY_CARE_PROVIDER_SITE_OTHER): Payer: Medicare HMO

## 2018-01-28 ENCOUNTER — Encounter (INDEPENDENT_AMBULATORY_CARE_PROVIDER_SITE_OTHER): Payer: Self-pay | Admitting: Orthopaedic Surgery

## 2018-01-28 VITALS — Wt 278.0 lb

## 2018-01-28 DIAGNOSIS — M1712 Unilateral primary osteoarthritis, left knee: Secondary | ICD-10-CM

## 2018-01-28 DIAGNOSIS — M1711 Unilateral primary osteoarthritis, right knee: Secondary | ICD-10-CM

## 2018-01-28 DIAGNOSIS — M79671 Pain in right foot: Secondary | ICD-10-CM

## 2018-01-28 DIAGNOSIS — M170 Bilateral primary osteoarthritis of knee: Secondary | ICD-10-CM

## 2018-01-28 NOTE — Progress Notes (Signed)
Office Visit Note   Patient: [name]           Date of Birth: [birth year]           MRN: [id number] Visit Date: 01/28/2018              Requested by: Pecolia Ades, NP [facility] [address], [place] [address] PCP: Pecolia Ades, NP   Assessment & Plan: Visit Diagnoses:  1. Pain in right foot   2. Unilateral primary osteoarthritis, left knee   3. Unilateral primary osteoarthritis, right knee     Plan: At this point I am still concerned about a knee replacement without knowing her hemoglobin A1c as well as waiting to see how she heals her abdominal wound and to see if she loses more weight.  She is tried heel inserts for her shoes.  We will see her back in 2 months to see how she is doing overall but no x-rays are needed.  Follow-Up Instructions: Return in about 2 months (around 03/30/2018).   Orders:  Orders Placed This Encounter  Procedures  . XR Foot Complete Right   No orders of the defined types were placed in this encounter.     Procedures: No procedures performed   Clinical Data: No additional findings.   Subjective: Chief Complaint  Patient presents with  . Left Foot - Pain  . Left Knee - Pain  . Right Knee - Pain  The patient is very well-known to me.  I seen her for many years now.  She is 47 years old and is morbidly obese with a BMI of 44.9.  However she is lost over half her body weight over the years that we have seen her.  She has severe arthritis in both her knees and is been having some right foot pain.  She is a poorly controlled diabetic and is been working on this diligently.  We decided to provide no more steroid injections in her knees due to the severity of arthritis but also the fact that this is having on her blood glucose.  She still working on getting an abdominal wound to heal as well.  She is working with a Designer, jewellery to get her blood glucose under control and is been referred to an endocrinologist  but cannot see them for a significant amount of time.  She does report that her blood sugars are doing better but I do not have a hemoglobin A1c to review.  HPI  Review of Systems She currently denies any headache, chest pain, shortness of breath, fever, chills, nausea, vomiting.  Objective: Vital Signs: Wt 278 lb (126.1 kg)   BMI 44.87 kg/m   Physical Exam She is alert and oriented x3 and in no acute distress Ortho Exam Examination of her right foot shows pain over the metatarsal heads but no deformities of the foot they can be seen externally.  She does have some neuropathy bilaterally.  Her foot appears to be well perfused.  Both knees have significant varus malalignment. Specialty Comments:  No specialty comments available.  Imaging: Xr Foot Complete Right  Result Date: 01/28/2018 3 views of the right foot show no acute findings.  There is slight midfoot arthritic changes.    PMFS History: Patient Active Problem List   Diagnosis Date Noted  . Postoperative abscess 10/27/2017  . Umbilical hernia, incarcerated, s/p repair 10/16/2017 10/16/2017  . Chronic pain of both knees 04/23/2017  . Unilateral  primary osteoarthritis, left knee 10/24/2016  . Unilateral primary osteoarthritis, right knee 10/24/2016  . Abnormal mammogram with microcalcification-Right inner lower quadrant 07/21/2013  . Oliguria and anuria 09/02/2012  . Constipation 03/02/2011  . Lethargy 03/01/2011  . Heel ulcer due to DM ([place]) 02/19/2011  . Wound, open, hip or thigh 02/19/2011  . Healing pressure ulcer stage III ([place]) 02/18/2011  . Morbidly obese ([place]) 02/18/2011  . Diabetes mellitus type 2 with complications, uncontrolled ([place]) 02/18/2011  . Sleep apnea 02/18/2011  . Hypothyroidism 02/18/2011  . Chronic pain 02/18/2011  . Acute lymphangitis 02/18/2011  . Cellulitis of foot 02/18/2011   Past Medical History:  Diagnosis Date  . Acute renal failure (ARF) ([place]) 09/02/2012  . Anemia   . Anxiety     panic attacks  . Back pain   . Blood transfusion   . Chronic heel ulcer ([place])   . Chronic kidney disease   . Chronic pain syndrome   . Depression   . Diabetes mellitus    type II   . DJD (degenerative joint disease)   . Dysrhythmia    pt unsure what this was  . Fibromyalgia   . GERD (gastroesophageal reflux disease)   . H/O: Bell's palsy 2011  . Heart murmur    "slight one"  . History of kidney stones   . History of MRSA infection OF ULCER  . Hypertension    no longer taken since 10-11 months per patient at preop phone call of 10/13/2017   . Hypothyroidism   . Hypoventilation associated with obesity syndrome ([place])   . Migraine   . Non-healing non-surgical wound    Right hip, has Wound vac to hip.  Started as a skin tear.  . Peripheral vascular disease ([place])   . PONV (postoperative nausea and vomiting)    can be slow to wake up after surgery  . Right foot drop   . Shortness of breath    with Activity  . Sleep apnea    "study shows not bad enough for CPAP.", pt denies  . Umbilical hernia   . URI (upper respiratory infection) 03/05/2011    Family History  Problem Relation Age of Onset  . Diabetes type II Father   . Heart attack Father   . Peripheral vascular disease Father   . Diabetes type II Mother   . Anesthesia problems Mother   . Heart attack Mother   . Peripheral vascular disease Mother   . Hypertension Mother     Past Surgical History:  Procedure Laterality Date  . BACK SURGERY     for lumbar disc disease X2  . [place]   Tumor removed- has steel plate  . BRAIN SURGERY      Plating due to soft spot closing too early- age 73  . BREAST LUMPECTOMY WITH NEEDLE LOCALIZATION Right 08/23/2013   Procedure: RIGHT BREAST LUMPECTOMY WITH NEEDLE LOCALIZATION;  Surgeon: Odis Hollingshead, MD;  Location: [facility];  Service: General;  Laterality: Right;  . CHOLECYSTECTOMY  1984  . EXCISION UMBILICAL NODULE N/A 5/62/1308   Procedure: EXCISION OF UMBILICUS;   Surgeon: Coralie Keens, MD;  Location: [facility];  Service: General;  Laterality: N/A;  . EYE SURGERY     eye lift  . I&D EXTREMITY  04/02/2011   Procedure: IRRIGATION AND DEBRIDEMENT EXTREMITY;  Surgeon: Mcarthur Rossetti;  Location: [facility];  Service: Orthopedics;  Laterality: Right;  I&D right heel ulcer, placement of A-cell graft  . INCISION AND  DRAINAGE ABSCESS N/A 10/28/2017   Procedure: INCISION AND DRAINAGE UMBILICAL HERNIA ABSCESS;  Surgeon: Ralene Ok, MD;  Location: [facility];  Service: General;  Laterality: N/A;  . INCISION AND DRAINAGE OF WOUND  08/29/2011   Procedure: IRRIGATION AND DEBRIDEMENT WOUND;  Surgeon: Theodoro Kos, DO;  Location: [facility];  Service: Plastics;  Laterality: Right;  . INSERTION OF MESH N/A 10/16/2017   Procedure: INSERTION OF MESH;  Surgeon: Coralie Keens, MD;  [facility];  Service: General;  Laterality: N/A;  . LITHOTRIPSY     2007ish  . right elbow    . UMBILICAL HERNIA REPAIR N/A 10/16/2017   Procedure: UMBILICAL HERNIA REPAIR WITH MESH;  Surgeon: Coralie Keens, MD;  [facility];  Service: General;  Laterality: N/A;   Social History   Occupational History  . Not on file  Tobacco Use  . Smoking status: Never Smoker  . Smokeless tobacco: Never Used  Substance and Sexual Activity  . Alcohol use: No  . Drug use: No  . Sexual activity: Never

## 2018-02-18 ENCOUNTER — Ambulatory Visit (INDEPENDENT_AMBULATORY_CARE_PROVIDER_SITE_OTHER): Payer: Medicare HMO | Admitting: Orthopaedic Surgery

## 2018-03-22 IMAGING — US US ABDOMEN LIMITED
1 series · 14 of 25 positions shown · non-contrast
Comparison: CT abdomen pelvis dated [DATE].

CLINICAL DATA: Periumbilical erythema. Evaluate for abscess. Known
periumbilical hernia.

EXAM:
ULTRASOUND ABDOMEN LIMITED

[Series 1: us abdomen limited · 0.13mm/px · 14 of 29 slices shown]
[im 1/29]
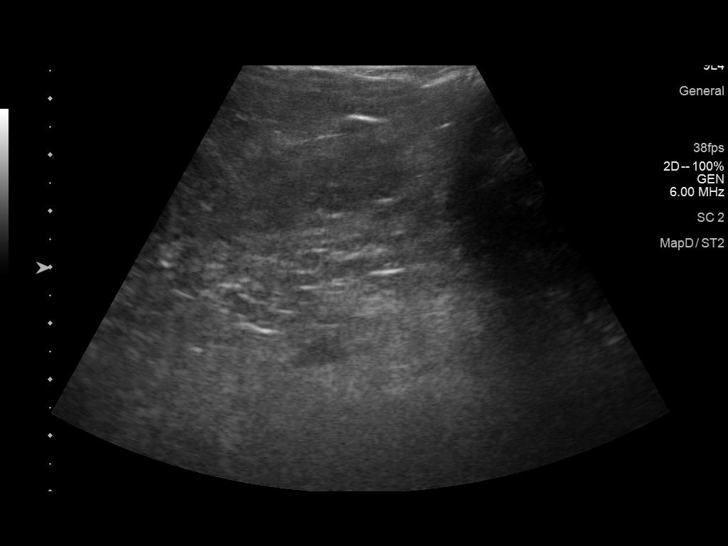
[im 3/29]
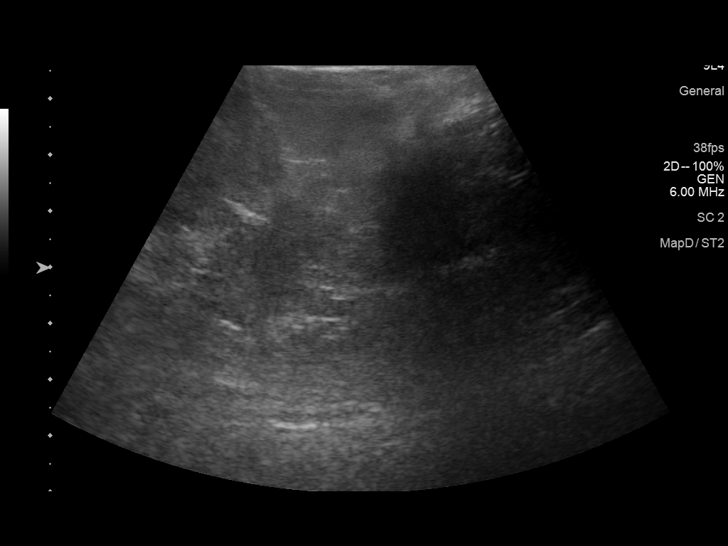
[im 5/29]
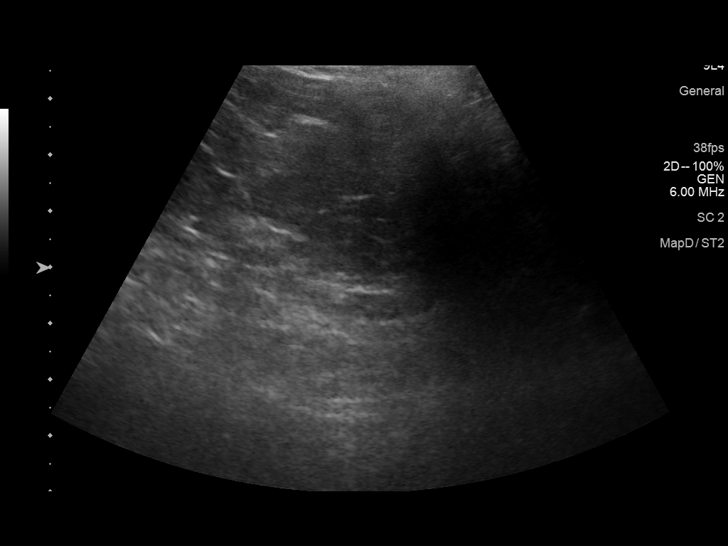
[im 8/29]
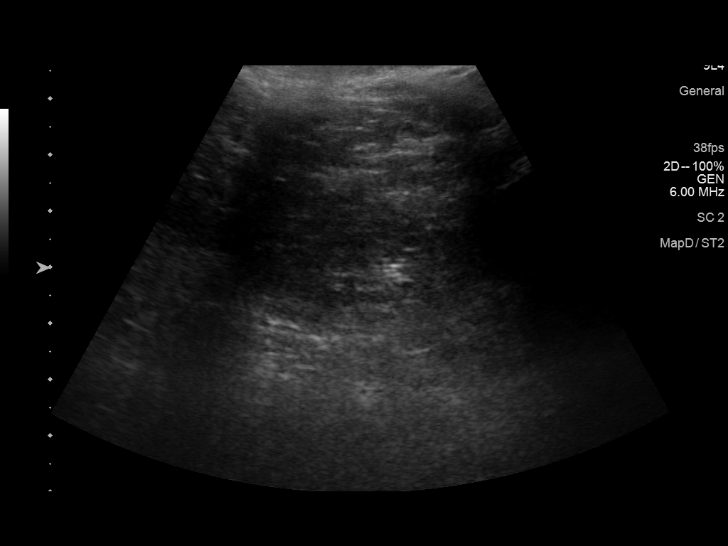
[im 10/29]
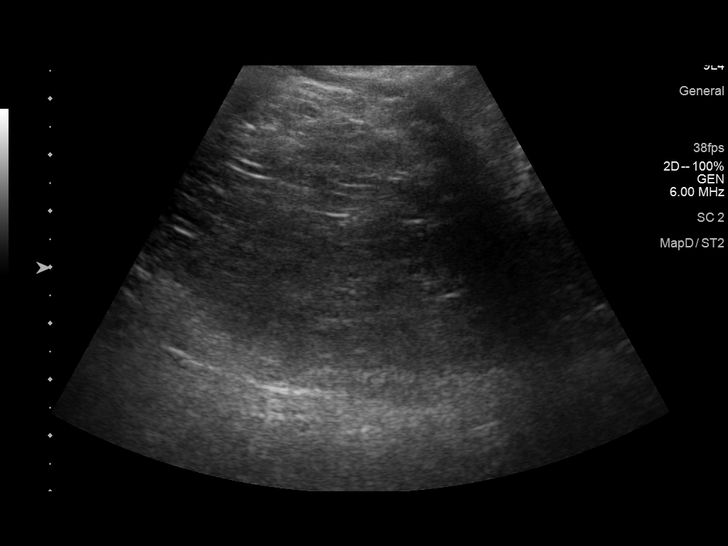
[im 11/29]
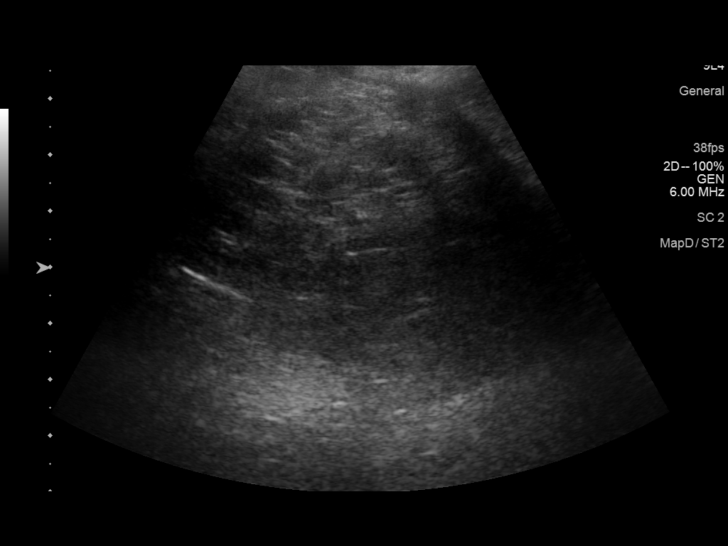
[im 13/29]
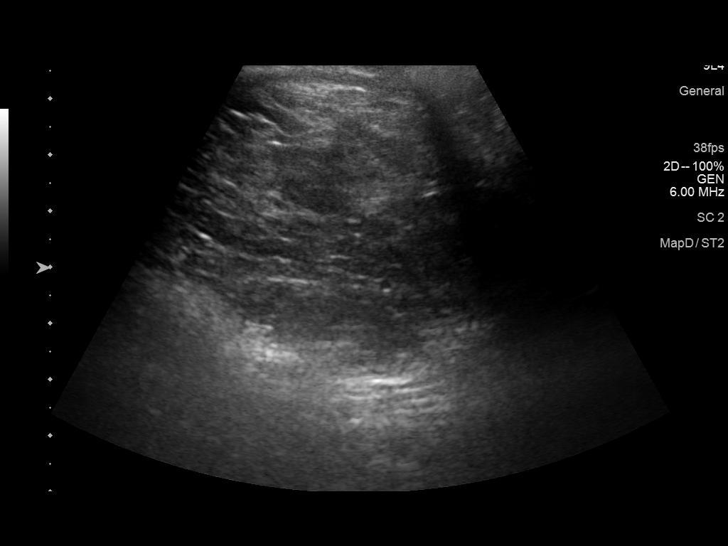
[im 16/29]
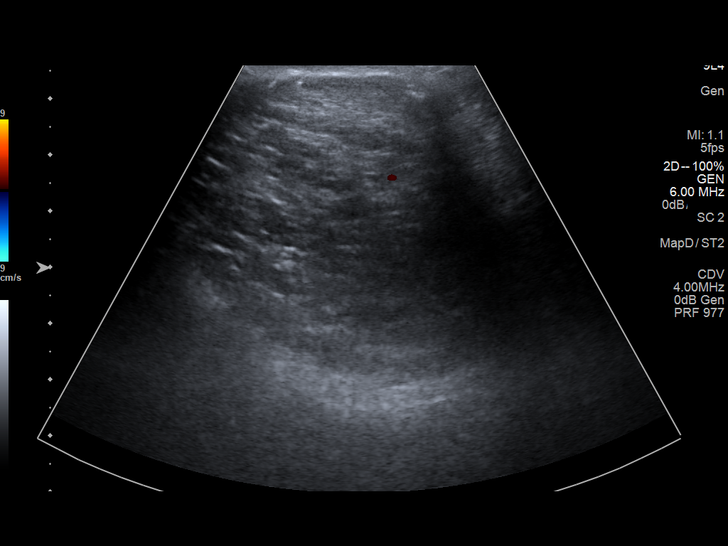
[im 18/29]
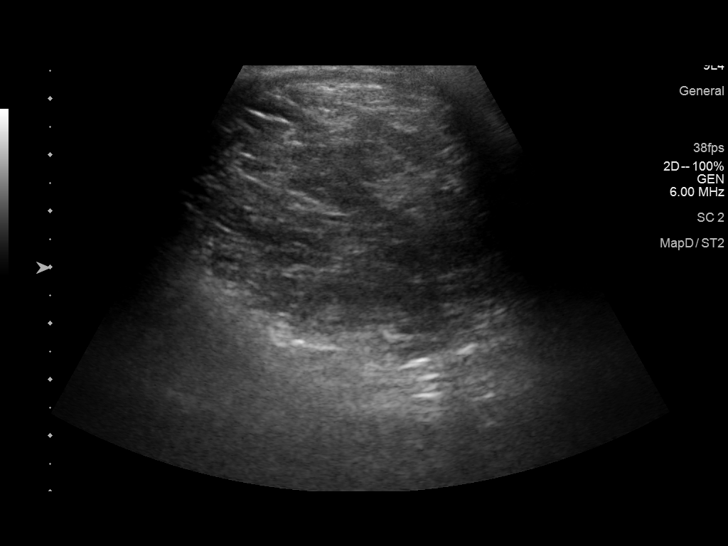
[im 19/29]
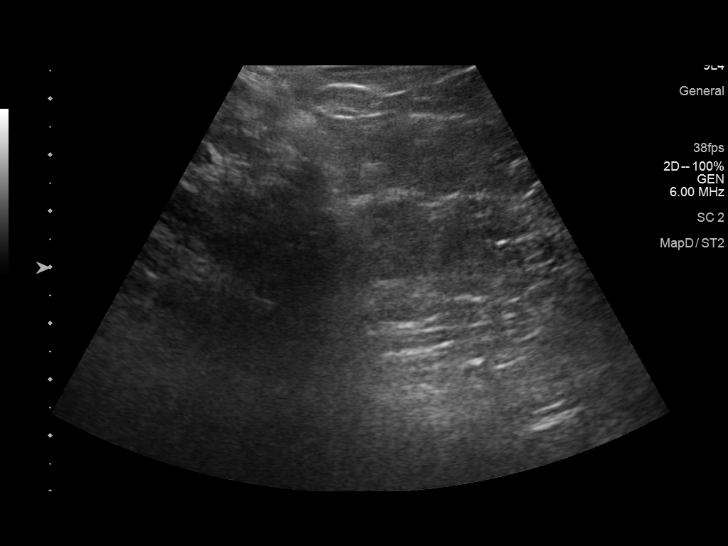
[im 22/29]
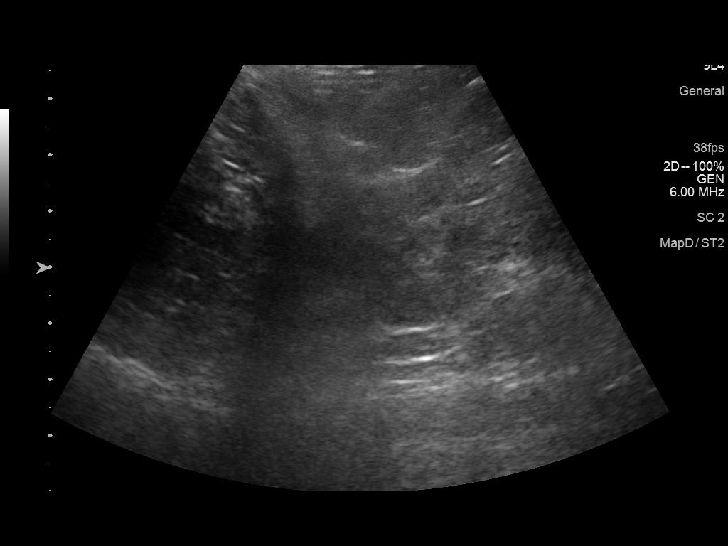
[im 24/29]
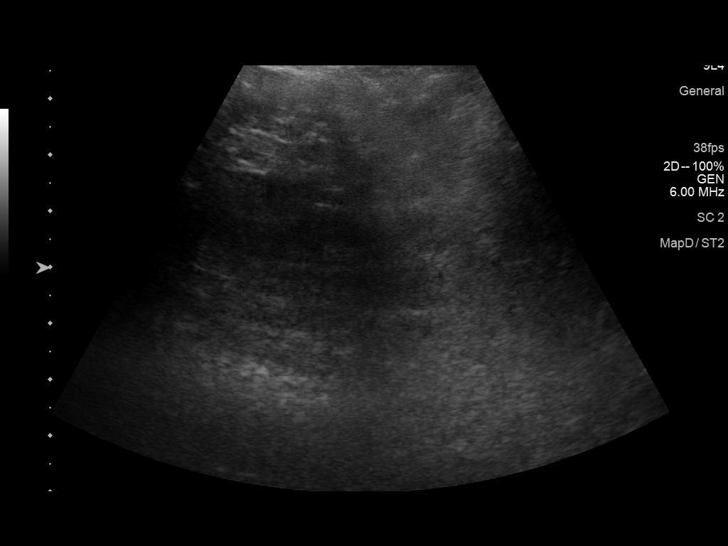
[im 26/29]
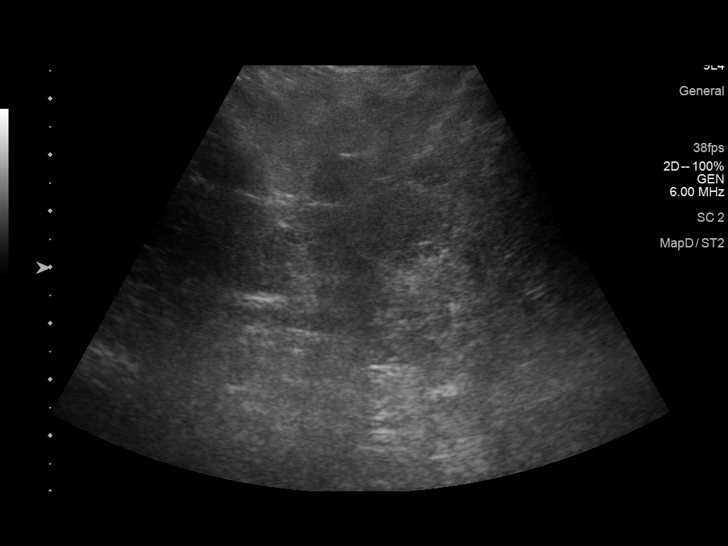
[im 29/29]
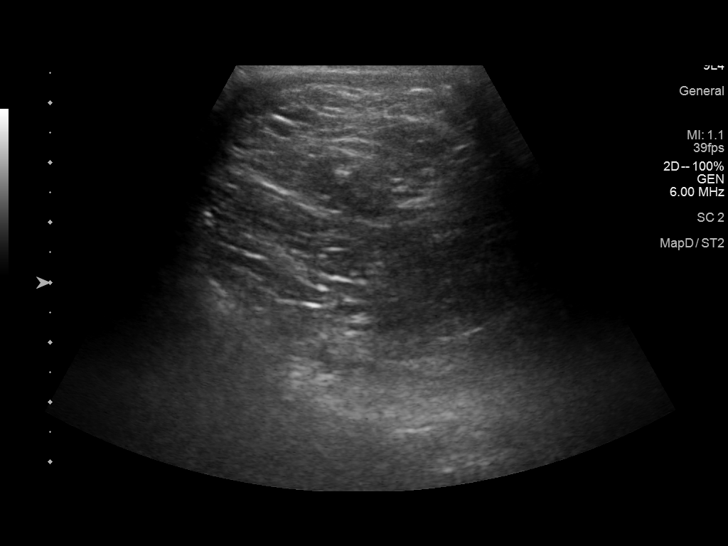

[14 of 25 positions shown; findings below may reference images not displayed]

FINDINGS: Large fat containing umbilical hernia is again seen. No definite
herniated bowel. No fluid collection.
IMPRESSION: No evidence of abscess. Unchanged large fat containing umbilical
hernia.

## 2018-04-01 ENCOUNTER — Encounter (INDEPENDENT_AMBULATORY_CARE_PROVIDER_SITE_OTHER): Payer: Self-pay | Admitting: Orthopaedic Surgery

## 2018-04-01 ENCOUNTER — Ambulatory Visit (INDEPENDENT_AMBULATORY_CARE_PROVIDER_SITE_OTHER): Payer: Medicare HMO | Admitting: Orthopaedic Surgery

## 2018-04-01 DIAGNOSIS — M170 Bilateral primary osteoarthritis of knee: Secondary | ICD-10-CM | POA: Diagnosis not present

## 2018-04-01 DIAGNOSIS — M1711 Unilateral primary osteoarthritis, right knee: Secondary | ICD-10-CM

## 2018-04-01 DIAGNOSIS — M1712 Unilateral primary osteoarthritis, left knee: Secondary | ICD-10-CM

## 2018-04-01 NOTE — Progress Notes (Signed)
The patient is very well-known to me.  We have seen her for many years now due to debilitating arthritis in both her knees.  She essentially has no cartilage left and it is the worst bone-on-bone arthritis we have seen a long period of time.  Even the last time I tried to place a steroid injection in the right knee I was unsuccessful of getting it in the knee joint and cause a lot of pain to her.  She is someone who is lost a tremendous amount of weight over the years and she is still working on weight loss so we can eventually replace her knees.  She is still having a significant problem with blood glucose control.  She states that her last hemoglobin A1c a month ago was 9.3 and her blood glucose runs 200-300 daily.  She understands that this is detrimental to her health as well as her ability to be able to perform knee replacement surgery.  She is also been dealing with her chronic pain management needs and has felt that her worsening pain contributes to her higher blood glucose.  Examination of her knees show significant deformities of both knees with varus malalignment and pain throughout the arc of motion.  There is significant patellofemoral crepitation with both knees.  I do feel that her pain management has been only somewhat successful for her as of late and I am not sure I can influence her pain management physicians to get her back on what her medications were in August.  She does have severe arthritis in her knees.  Hopefully they can get her back to her original dosages that will help her in terms of being able to then get her blood glucose under control so we can eventually replace her knees and get her off of narcotics or at least significantly diminish her narcotic intake.  I do feel that she should see a specialist such as Dr. Redgie Grayer they can help from a weight loss standpoint and nutritional standpoint.  I would like to see her back in about 3 months with a repeat AP and lateral both  knees.

## 2018-04-02 ENCOUNTER — Other Ambulatory Visit (INDEPENDENT_AMBULATORY_CARE_PROVIDER_SITE_OTHER): Payer: Self-pay

## 2018-04-02 DIAGNOSIS — E669 Obesity, unspecified: Secondary | ICD-10-CM

## 2018-04-23 ENCOUNTER — Ambulatory Visit (INDEPENDENT_AMBULATORY_CARE_PROVIDER_SITE_OTHER): Payer: Medicare HMO | Admitting: Orthopaedic Surgery

## 2018-05-06 ENCOUNTER — Ambulatory Visit (INDEPENDENT_AMBULATORY_CARE_PROVIDER_SITE_OTHER): Payer: Medicare HMO | Admitting: Orthopaedic Surgery

## 2018-05-18 ENCOUNTER — Other Ambulatory Visit: Payer: Self-pay | Admitting: Family Medicine

## 2018-05-18 ENCOUNTER — Encounter (INDEPENDENT_AMBULATORY_CARE_PROVIDER_SITE_OTHER): Payer: Self-pay | Admitting: Orthopaedic Surgery

## 2018-05-18 ENCOUNTER — Ambulatory Visit (INDEPENDENT_AMBULATORY_CARE_PROVIDER_SITE_OTHER): Payer: Medicare HMO | Admitting: Orthopaedic Surgery

## 2018-05-18 ENCOUNTER — Other Ambulatory Visit: Payer: Self-pay | Admitting: Obstetrics and Gynecology

## 2018-05-18 DIAGNOSIS — G8929 Other chronic pain: Secondary | ICD-10-CM

## 2018-05-18 DIAGNOSIS — M79671 Pain in right foot: Secondary | ICD-10-CM

## 2018-05-18 DIAGNOSIS — M25562 Pain in left knee: Secondary | ICD-10-CM | POA: Diagnosis not present

## 2018-05-18 DIAGNOSIS — Z8739 Personal history of other diseases of the musculoskeletal system and connective tissue: Secondary | ICD-10-CM | POA: Diagnosis not present

## 2018-05-18 DIAGNOSIS — Z1231 Encounter for screening mammogram for malignant neoplasm of breast: Secondary | ICD-10-CM

## 2018-05-18 MED ORDER — DICLOFENAC SODIUM 2 % TD SOLN: 2.0000 g | Freq: Four times a day (QID) | TRANSDERMAL | 3 refills | Status: DC | PRN

## 2018-05-18 MED ORDER — METHYLPREDNISOLONE ACETATE 40 MG/ML IJ SUSP
40.0000 mg | INTRAMUSCULAR | Status: AC | PRN
  Administered 2018-05-18: 40 mg via INTRA_ARTICULAR

## 2018-05-18 MED ORDER — LIDOCAINE HCL 1 % IJ SOLN
3.0000 mL | INTRAMUSCULAR | Status: AC | PRN
  Administered 2018-05-18: 3 mL

## 2018-05-18 MED ORDER — DICLOFENAC SODIUM 1 % TD GEL: 2.0000 g | Freq: Four times a day (QID) | TRANSDERMAL | 3 refills | Status: DC

## 2018-05-18 NOTE — Progress Notes (Signed)
Office Visit Note   Patient: [name]           Date of Birth: [birth year]           MRN: [id number] Visit Date: 05/18/2018              Requested by: Heather Ades, NP No address on file PCP: Heather Ades, NP   Assessment & Plan: Visit Diagnoses:  1. History of right foot drop   2. Pain in right foot   3. Chronic pain of left knee     Plan: I did provide a steroid injection in her left knee today and counseled her about how this may detrimentally affect her blood glucose.  I do feel that she would be a candidate for topical anti-inflammatories for her heel and both knees.  We will send cement for and hopefully her insurance can approve this.  I think is reasonable now given her diabetes that steroids are affecting this too much.  I did recommend at least a cushion gel insert for her right foot and we will send her to [facility] to see if they can provide something that will help her feet feel better.  All question concerns were answered addressed.  We will see her again in 3 months to see how she is doing overall.  Follow-Up Instructions: Return in about 3 months (around 08/16/2018).   Orders:  Orders Placed This Encounter  Procedures  . Large Joint Inj   Meds ordered this encounter  Medications  . diclofenac sodium (VOLTAREN) 1 % GEL    Sig: Apply 2 g topically 4 (four) times daily.    Dispense:  100 g    Refill:  3  . Diclofenac Sodium 2 % SOLN    Sig: Place 2 g onto the skin 4 (four) times daily as needed.    Dispense:  1 Bottle    Refill:  3      Procedures: Large Joint Inj: L knee on 05/18/2018 3:44 PM Indications: diagnostic evaluation and pain Details: 22 G 1.5 in needle, superolateral approach  Arthrogram: No  Medications: 3 mL lidocaine 1 %; 40 mg methylPREDNISolone acetate 40 MG/ML Outcome: tolerated well, no immediate complications Procedure, treatment alternatives, risks and benefits explained, specific risks discussed. Consent was  given by the patient. Immediately prior to procedure a time out was called to verify the correct patient, procedure, equipment, support staff and site/side marked as required. Patient was prepped and draped in the usual sterile fashion.       Clinical Data: No additional findings.   Subjective: Chief Complaint  Patient presents with  . Right Foot - Pain  The patient is well-known to me.  She is mainly coming in for her foot today.  She is someone who is morbidly obese but has lost a significant amount of weight over the years.  She has known severe end-stage arthritis in both her knees.  Things have gotten quite bad for her left knee.  Her right knee is already needed we can get a steroid shot in.  She also has chronic history of right foot drop.  She is also had a decubitus ulcer on the heel which we are able to get to heal with synthetic tissue over time.  She does wear a brace on that ankle due to her chronic foot drop.  She is been having a lot of heel pain.  X-rays recently showed no evidence of osteomyelitis.  This was of the right calcaneus.  She has been working on her blood glucose which is been under bad control but recently she says is been getting better.  HPI  Review of Systems She currently denies any headache, chest pain, short of breath, fever, chills, nausea, vomiting  Objective: Vital Signs: There were no vitals taken for this visit.  Physical Exam She is alert and oriented x3 and in no acute distress Ortho Exam Examination of her right heel shows that the skin is intact completely.  There is some pain over the Achilles and the heel itself.  She does have evidence of foot drop.  Her arch appears normal.  There is no open wounds. Specialty Comments:  No specialty comments available.  Imaging: No results found.   PMFS History: Patient Active Problem List   Diagnosis Date Noted  . Postoperative abscess 10/27/2017  . Umbilical hernia, incarcerated, s/p repair  10/16/2017 10/16/2017  . Chronic pain of both knees 04/23/2017  . Unilateral primary osteoarthritis, left knee 10/24/2016  . Unilateral primary osteoarthritis, right knee 10/24/2016  . Abnormal mammogram with microcalcification-Right inner lower quadrant 07/21/2013  . Oliguria and anuria 09/02/2012  . Constipation 03/02/2011  . Lethargy 03/01/2011  . Heel ulcer due to DM ([name]) 02/19/2011  . Wound, open, hip or thigh 02/19/2011  . Healing pressure ulcer stage III ([name]) 02/18/2011  . Morbidly obese ([name]) 02/18/2011  . Diabetes mellitus type 2 with complications, uncontrolled ([name]) 02/18/2011  . Sleep apnea 02/18/2011  . Hypothyroidism 02/18/2011  . Chronic pain 02/18/2011  . Acute lymphangitis 02/18/2011  . Cellulitis of foot 02/18/2011   Past Medical History:  Diagnosis Date  . Acute renal failure (ARF) ([name]) 09/02/2012  . Anemia   . Anxiety    panic attacks  . Back pain   . Blood transfusion   . Chronic heel ulcer ([name])   . Chronic kidney disease   . Chronic pain syndrome   . Depression   . Diabetes mellitus    type II   . DJD (degenerative joint disease)   . Dysrhythmia    pt unsure what this was  . Fibromyalgia   . GERD (gastroesophageal reflux disease)   . H/O: Bell's palsy 2011  . Heart murmur    "slight one"  . History of kidney stones   . History of MRSA infection OF ULCER  . Hypertension    no longer taken since 10-11 months per patient at preop phone call of 10/13/2017   . Hypothyroidism   . Hypoventilation associated with obesity syndrome ([name])   . Migraine   . Non-healing non-surgical wound    Right hip, has Wound vac to hip.  Started as a skin tear.  . Peripheral vascular disease ([name])   . PONV (postoperative nausea and vomiting)    can be slow to wake up after surgery  . Right foot drop   . Shortness of breath    with Activity  . Sleep apnea    "study shows not bad enough for CPAP.", pt denies  . Umbilical hernia   . URI (upper respiratory  infection) 03/05/2011    Family History  Problem Relation Age of Onset  . Diabetes type II Father   . Heart attack Father   . Peripheral vascular disease Father   . Diabetes type II Mother   . Anesthesia problems Mother   . Heart attack Mother   . Peripheral vascular disease Mother   . Hypertension Mother  Past Surgical History:  Procedure Laterality Date  . BACK SURGERY     for lumbar disc disease X2  . 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 BRAIN SURGERY      Plating due to soft spot closing too early- age 47  . BREAST LUMPECTOMY WITH NEEDLE LOCALIZATION Right 08/23/2013   Procedure: RIGHT BREAST LUMPECTOMY WITH NEEDLE LOCALIZATION;  Surgeon: Odis Hollingshead, MD;  Location: [facility];  Service: General;  Laterality: Right;  . CHOLECYSTECTOMY  1984  . EXCISION UMBILICAL NODULE N/A 06/21/760   Procedure: EXCISION OF UMBILICUS;  Surgeon: Coralie Keens, MD;  [facility];  Service: General;  Laterality: N/A;  . EYE SURGERY     eye lift  . I&D EXTREMITY  04/02/2011   Procedure: IRRIGATION AND DEBRIDEMENT EXTREMITY;  Surgeon: Mcarthur Rossetti;  Location: [facility];  Service: Orthopedics;  Laterality: Right;  I&D right heel ulcer, placement of A-cell graft  . INCISION AND DRAINAGE ABSCESS N/A 10/28/2017   Procedure: INCISION AND DRAINAGE UMBILICAL HERNIA ABSCESS;  Surgeon: Ralene Ok, MD;  Location: [facility];  Service: General;  Laterality: N/A;  . INCISION AND DRAINAGE OF WOUND  08/29/2011   Procedure: IRRIGATION AND DEBRIDEMENT WOUND;  Surgeon: Theodoro Kos, DO;  Location: [facility];  Service: Plastics;  Laterality: Right;  . INSERTION OF MESH N/A 10/16/2017   Procedure: INSERTION OF MESH;  Surgeon: Coralie Keens, MD;  [facility];  Service: General;  Laterality: N/A;  . LITHOTRIPSY     2007ish  . right elbow    . UMBILICAL HERNIA REPAIR N/A 10/16/2017   Procedure: UMBILICAL HERNIA REPAIR WITH MESH;  Surgeon: Coralie Keens, MD;  [facility];   Service: General;  Laterality: N/A;   Social History   Occupational History  . Not on file  Tobacco Use  . Smoking status: Never Smoker  . Smokeless tobacco: Never Used  Substance and Sexual Activity  . Alcohol use: No  . Drug use: No  . Sexual activity: Never

## 2018-05-19 ENCOUNTER — Telehealth (INDEPENDENT_AMBULATORY_CARE_PROVIDER_SITE_OTHER): Payer: Self-pay | Admitting: Orthopaedic Surgery

## 2018-05-19 NOTE — Telephone Encounter (Signed)
Pt called saying her RX Dr. Ninfa Linden wrote yesterday needs a pre authorization for Diclofenac Sodium 2 % SOLN  Pt # [phone]

## 2018-05-20 NOTE — Telephone Encounter (Signed)
I called patient and she says that this year when her meds have been denied as not covered, provider will call into Insurance and request to do PA, and state patient needs this med due to severe uncontrolled pain.  She is having so much pain she cannot hardly walk. I will look into this and let her know what I can find out.

## 2018-05-21 ENCOUNTER — Encounter (INDEPENDENT_AMBULATORY_CARE_PROVIDER_SITE_OTHER): Payer: Medicare HMO

## 2018-05-21 NOTE — Telephone Encounter (Signed)
Sorry I had this all day, I did not get to it.  Can you look into for her?

## 2018-05-22 ENCOUNTER — Other Ambulatory Visit (INDEPENDENT_AMBULATORY_CARE_PROVIDER_SITE_OTHER): Payer: Self-pay

## 2018-05-22 MED ORDER — DICLOFENAC SODIUM 2 % TD SOLN: 2.0000 g | Freq: Four times a day (QID) | TRANSDERMAL | 3 refills | Status: DC | PRN

## 2018-05-22 NOTE — Telephone Encounter (Signed)
Sent Rx to [facility] to try to see if they can help with cost and coverage

## 2018-05-25 ENCOUNTER — Telehealth (INDEPENDENT_AMBULATORY_CARE_PROVIDER_SITE_OTHER): Payer: Self-pay | Admitting: Orthopaedic Surgery

## 2018-05-25 NOTE — Telephone Encounter (Signed)
[facility] left message requesting a call back from Dr Trevor Mace office regarding the Rx for Pennsaid 2 grams.

## 2018-05-26 NOTE — Telephone Encounter (Signed)
Spoke with pharmacy

## 2018-05-28 ENCOUNTER — Ambulatory Visit (INDEPENDENT_AMBULATORY_CARE_PROVIDER_SITE_OTHER): Payer: Medicare HMO | Admitting: Family Medicine

## 2018-06-10 ENCOUNTER — Ambulatory Visit: Payer: Medicare HMO

## 2018-06-11 ENCOUNTER — Ambulatory Visit (INDEPENDENT_AMBULATORY_CARE_PROVIDER_SITE_OTHER): Payer: Medicare HMO | Admitting: Family Medicine

## 2018-06-16 ENCOUNTER — Telehealth (INDEPENDENT_AMBULATORY_CARE_PROVIDER_SITE_OTHER): Payer: Self-pay | Admitting: Orthopaedic Surgery

## 2018-06-16 NOTE — Telephone Encounter (Signed)
Patient called and stated that was told medication was coming from [facility] and she has not received it yet.  Please call patient to advise.

## 2018-06-17 NOTE — Telephone Encounter (Signed)
LMOM for patient letting her know that I am trying to get a hold of the pennsaid rep to see if we can get a sample bottle

## 2018-07-01 ENCOUNTER — Ambulatory Visit (INDEPENDENT_AMBULATORY_CARE_PROVIDER_SITE_OTHER): Payer: Medicare HMO | Admitting: Orthopaedic Surgery

## 2018-07-20 ENCOUNTER — Telehealth (INDEPENDENT_AMBULATORY_CARE_PROVIDER_SITE_OTHER): Payer: Self-pay | Admitting: Orthopaedic Surgery

## 2018-07-20 NOTE — Telephone Encounter (Signed)
Pt stated she has spoke with Caryl Pina about some medication and she is checking on the status of it.

## 2018-07-21 NOTE — Telephone Encounter (Signed)
LMOM for patient that I haven't gotten any samples recently of the Pennsaid

## 2018-08-19 ENCOUNTER — Ambulatory Visit: Payer: Self-pay | Admitting: Orthopaedic Surgery

## 2018-09-02 ENCOUNTER — Ambulatory Visit (INDEPENDENT_AMBULATORY_CARE_PROVIDER_SITE_OTHER): Payer: Medicare HMO

## 2018-09-02 ENCOUNTER — Encounter: Payer: Self-pay | Admitting: Orthopaedic Surgery

## 2018-09-02 ENCOUNTER — Other Ambulatory Visit: Payer: Self-pay

## 2018-09-02 ENCOUNTER — Ambulatory Visit: Payer: Self-pay

## 2018-09-02 ENCOUNTER — Ambulatory Visit (INDEPENDENT_AMBULATORY_CARE_PROVIDER_SITE_OTHER): Payer: Medicare HMO | Admitting: Orthopaedic Surgery

## 2018-09-02 DIAGNOSIS — M25562 Pain in left knee: Secondary | ICD-10-CM

## 2018-09-02 DIAGNOSIS — M25561 Pain in right knee: Secondary | ICD-10-CM

## 2018-09-02 DIAGNOSIS — G8929 Other chronic pain: Secondary | ICD-10-CM | POA: Diagnosis not present

## 2018-09-02 DIAGNOSIS — M1711 Unilateral primary osteoarthritis, right knee: Secondary | ICD-10-CM

## 2018-09-02 DIAGNOSIS — M1712 Unilateral primary osteoarthritis, left knee: Secondary | ICD-10-CM

## 2018-09-02 MED ORDER — LIDOCAINE HCL 1 % IJ SOLN
3.0000 mL | INTRAMUSCULAR | Status: AC | PRN
  Administered 2018-09-02: 3 mL

## 2018-09-02 MED ORDER — METHYLPREDNISOLONE ACETATE 40 MG/ML IJ SUSP
40.0000 mg | INTRAMUSCULAR | Status: AC | PRN
Start: 2018-09-02 — End: 2018-09-02
  Administered 2018-09-02: 40 mg via INTRA_ARTICULAR

## 2018-09-02 MED ORDER — METHYLPREDNISOLONE ACETATE 40 MG/ML IJ SUSP
40.0000 mg | INTRAMUSCULAR | Status: AC | PRN
  Administered 2018-09-02: 40 mg via INTRA_ARTICULAR

## 2018-09-02 NOTE — Progress Notes (Signed)
Office Visit Note   Patient: [name]           Date of Birth: May 08, 1970           MRN: [id number] Visit Date: 09/02/2018              Requested by: Pecolia Ades, NP No address on file PCP: Pecolia Ades, NP   Assessment & Plan: Visit Diagnoses:  1. Chronic pain of left knee   2. Chronic pain of right knee   3. Unilateral primary osteoarthritis, left knee   4. Unilateral primary osteoarthritis, right knee     Plan: I did place steroid injections in both knees today which she tolerated well.  I counseled her about how this may detrimentally affect her blood glucose.  She still wants to consider knee replacement surgery sometime potentially in the fall of this year.  Given her weight loss and better control of diabetes she is a better surgical candidate.  We will see how she is doing in 3 months.  No x-rays are needed but we would like to at least have her weighed and a BMI calculated.  Follow-Up Instructions: Return in about 3 months (around 12/03/2018).   Orders:  Orders Placed This Encounter  Procedures  . Large Joint Inj  . Large Joint Inj  . XR KNEE 3 VIEW LEFT  . XR KNEE 3 VIEW RIGHT   No orders of the defined types were placed in this encounter.     Procedures: Large Joint Inj: R knee on 09/02/2018 4:34 PM Indications: diagnostic evaluation and pain Details: 22 G 1.5 in needle, superolateral approach  Arthrogram: No  Medications: 3 mL lidocaine 1 %; 40 mg methylPREDNISolone acetate 40 MG/ML Outcome: tolerated well, no immediate complications Procedure, treatment alternatives, risks and benefits explained, specific risks discussed. Consent was given by the patient. Immediately prior to procedure a time out was called to verify the correct patient, procedure, equipment, support staff and site/side marked as required. Patient was prepped and draped in the usual sterile fashion.   Large Joint Inj: L knee on 09/02/2018 4:34 PM Indications:  diagnostic evaluation and pain Details: 22 G 1.5 in needle, superolateral approach  Arthrogram: No  Medications: 3 mL lidocaine 1 %; 40 mg methylPREDNISolone acetate 40 MG/ML Outcome: tolerated well, no immediate complications Procedure, treatment alternatives, risks and benefits explained, specific risks discussed. Consent was given by the patient. Immediately prior to procedure a time out was called to verify the correct patient, procedure, equipment, support staff and site/side marked as required. Patient was prepped and draped in the usual sterile fashion.       Clinical Data: No additional findings.   Subjective: Chief Complaint  Patient presents with  . Left Knee - Pain  . Right Knee - Pain  The patient is a 48 year old female well-known to [name].  She used to weigh well over 400 pounds and has lost over half of her body weight.  She has debilitating arthritis in both her knees.  She is also a diabetic that had poor control of her diabetes for a long period of time.  She sees an endocrinologist now and she reports a hemoglobin A1c below 8 in the 7 range.  She would like to have steroid injections in both knees today knowing that they may evaluate her blood glucose but her pain is severe.  She walks with assist device.  Since we saw her last she has healed a chronic  abdominal wound  HPI  Review of Systems She currently denies any headache, chest pain, shortness of breath, fever, chills, nausea, vomiting  Objective: Vital Signs: There were no vitals taken for this visit.  Physical Exam She is alert and orient x3 and in no acute distress Ortho Exam Examination of both knees show severe pain throughout both knees on clinical exam with range of motion and palpation.  There is significant crepitation throughout both knees. Specialty Comments:  No specialty comments available.  Imaging: Xr Knee 3 View Left  Result Date: 09/02/2018 2 views of the left knee show profound end-stage  arthritis with no joint space remaining throughout the knee.  There are large periarticular osteophytes in all 3 compartments.  Xr Knee 3 View Right  Result Date: 09/02/2018 2 views of the right knee show severe tricompartmental arthritis with complete joint space loss in all 3 compartments.  There are large para-articular osteophytes in all 3 compartments.    PMFS History: Patient Active Problem List   Diagnosis Date Noted  . Postoperative abscess 10/27/2017  . Umbilical hernia, incarcerated, s/p repair 10/16/2017 10/16/2017  . Chronic pain of both knees 04/23/2017  . Unilateral primary osteoarthritis, left knee 10/24/2016  . Unilateral primary osteoarthritis, right knee 10/24/2016  . Abnormal mammogram with microcalcification-Right inner lower quadrant 07/21/2013  . Oliguria and anuria 09/02/2012  . Constipation 03/02/2011  . Lethargy 03/01/2011  . Heel ulcer due to DM ([place]) 02/19/2011  . Wound, open, hip or thigh 02/19/2011  . Healing pressure ulcer stage III ([place]) 02/18/2011  . Morbidly obese ([place]) 02/18/2011  . Diabetes mellitus type 2 with complications, uncontrolled ([place]) 02/18/2011  . Sleep apnea 02/18/2011  . Hypothyroidism 02/18/2011  . Chronic pain 02/18/2011  . Acute lymphangitis 02/18/2011  . Cellulitis of foot 02/18/2011   Past Medical History:  Diagnosis Date  . Acute renal failure (ARF) ([place]) 09/02/2012  . Anemia   . Anxiety    panic attacks  . Back pain   . Blood transfusion   . Chronic heel ulcer ([place])   . Chronic kidney disease   . Chronic pain syndrome   . Depression   . Diabetes mellitus    type II   . DJD (degenerative joint disease)   . Dysrhythmia    pt unsure what this was  . Fibromyalgia   . GERD (gastroesophageal reflux disease)   . H/O: Bell's palsy 2011  . Heart murmur    "slight one"  . History of kidney stones   . History of MRSA infection OF ULCER  . Hypertension    no longer taken since 10-11 months per patient at preop phone call  of 10/13/2017   . Hypothyroidism   . Hypoventilation associated with obesity syndrome ([place])   . Migraine   . Non-healing non-surgical wound    Right hip, has Wound vac to hip.  Started as a skin tear.  . Peripheral vascular disease ([place])   . PONV (postoperative nausea and vomiting)    can be slow to wake up after surgery  . Right foot drop   . Shortness of breath    with Activity  . Sleep apnea    "study shows not bad enough for CPAP.", pt denies  . Umbilical hernia   . URI (upper respiratory infection) 03/05/2011    Family History  Problem Relation Age of Onset  . Diabetes type II Father   . Heart attack Father   . Peripheral vascular disease Father   . Diabetes  type II Mother   . Anesthesia problems Mother   . Heart attack Mother   . Peripheral vascular disease Mother   . Hypertension Mother     Past Surgical History:  Procedure Laterality Date  . BACK SURGERY     for lumbar disc disease X2  . [place]   Tumor removed- has steel plate  . BRAIN SURGERY      Plating due to soft spot closing too early- age 71  . BREAST LUMPECTOMY WITH NEEDLE LOCALIZATION Right 08/23/2013   Procedure: RIGHT BREAST LUMPECTOMY WITH NEEDLE LOCALIZATION;  Surgeon: Odis Hollingshead, MD;  Location: [facility];  Service: General;  Laterality: Right;  . CHOLECYSTECTOMY  1984  . EXCISION UMBILICAL NODULE N/A 0/09/1217   Procedure: EXCISION OF UMBILICUS;  Surgeon: Coralie Keens, MD;  [facility];  Service: General;  Laterality: N/A;  . EYE SURGERY     eye lift  . I&D EXTREMITY  04/02/2011   Procedure: IRRIGATION AND DEBRIDEMENT EXTREMITY;  Surgeon: Mcarthur Rossetti;  Location: [facility];  Service: Orthopedics;  Laterality: Right;  I&D right heel ulcer, placement of A-cell graft  . INCISION AND DRAINAGE ABSCESS N/A 10/28/2017   Procedure: INCISION AND DRAINAGE UMBILICAL HERNIA ABSCESS;  Surgeon: Ralene Ok, MD;  Location: [facility];  Service: General;  Laterality: N/A;  . INCISION AND  DRAINAGE OF WOUND  08/29/2011   Procedure: IRRIGATION AND DEBRIDEMENT WOUND;  Surgeon: Theodoro Kos, DO;  Location: [facility];  Service: Plastics;  Laterality: Right;  . INSERTION OF MESH N/A 10/16/2017   Procedure: INSERTION OF MESH;  Surgeon: Coralie Keens, MD;  [facility];  Service: General;  Laterality: N/A;  . LITHOTRIPSY     2007ish  . right elbow    . UMBILICAL HERNIA REPAIR N/A 10/16/2017   Procedure: UMBILICAL HERNIA REPAIR WITH MESH;  Surgeon: Coralie Keens, MD;  [facility];  Service: General;  Laterality: N/A;   Social History   Occupational History  . Not on file  Tobacco Use  . Smoking status: Never Smoker  . Smokeless tobacco: Never Used  Substance and Sexual Activity  . Alcohol use: No  . Drug use: No  . Sexual activity: Never

## 2018-10-08 ENCOUNTER — Other Ambulatory Visit: Payer: Self-pay

## 2018-10-08 MED ORDER — DICLOFENAC SODIUM 2 % TD SOLN: 2.0000 g | Freq: Four times a day (QID) | TRANSDERMAL | 3 refills | Status: DC | PRN

## 2018-12-02 ENCOUNTER — Other Ambulatory Visit: Payer: Self-pay

## 2018-12-02 ENCOUNTER — Encounter: Payer: Self-pay | Admitting: Orthopaedic Surgery

## 2018-12-02 ENCOUNTER — Ambulatory Visit (INDEPENDENT_AMBULATORY_CARE_PROVIDER_SITE_OTHER): Payer: Medicare HMO | Admitting: Orthopaedic Surgery

## 2018-12-02 VITALS — Ht 66.0 in | Wt 301.0 lb

## 2018-12-02 DIAGNOSIS — M1712 Unilateral primary osteoarthritis, left knee: Secondary | ICD-10-CM

## 2018-12-02 DIAGNOSIS — M1711 Unilateral primary osteoarthritis, right knee: Secondary | ICD-10-CM

## 2018-12-02 DIAGNOSIS — M170 Bilateral primary osteoarthritis of knee: Secondary | ICD-10-CM | POA: Diagnosis not present

## 2018-12-02 MED ORDER — LIDOCAINE HCL 1 % IJ SOLN
3.0000 mL | INTRAMUSCULAR | Status: AC | PRN
  Administered 2018-12-02: 3 mL

## 2018-12-02 MED ORDER — METHYLPREDNISOLONE ACETATE 40 MG/ML IJ SUSP
40.0000 mg | INTRAMUSCULAR | Status: AC | PRN
  Administered 2018-12-02: 40 mg via INTRA_ARTICULAR

## 2018-12-02 NOTE — Progress Notes (Signed)
   Procedure Note  Patient: [name]             Date of Birth: October 02, 1970           MRN: [id number]             Visit Date: 12/02/2018  HPI: Mrs. [name] returns today requesting bilateral knee injections.  She got relief with the injection back on 09/02/2018.  But now her right knee is gotten the point where she can barely walk.  She has had some of her medications changed by her primary care physician to help better control her diabetes.  Primary care physician she states she is aware that she is getting cortisone injections both knees every 3 months.  Last hemoglobin A1c was 8.9 , 2 months ago.  Physical exam: Bilateral knees no abnormal warmth erythema or effusion.  Global tenderness throughout.  Procedures: Visit Diagnoses:  1. Unilateral primary osteoarthritis, right knee   2. Unilateral primary osteoarthritis, left knee     Large Joint Inj on 12/02/2018 4:47 PM Indications: pain Details: 22 G 1.5 in needle, anterolateral approach  Arthrogram: No  Medications: 3 mL lidocaine 1 %; 40 mg methylPREDNISolone acetate 40 MG/ML Outcome: tolerated well, no immediate complications Procedure, treatment alternatives, risks and benefits explained, specific risks discussed. Consent was given by the patient. Immediately prior to procedure a time out was called to verify the correct patient, procedure, equipment, support staff and site/side marked as required. Patient was prepped and draped in the usual sterile fashion.     Plan: See her back in 3 months for repeat injections.  She is to pay close attention to her glucose levels over the next few days.  Discussed with her working on quad strengthening.  Questions were encouraged and answered.

## 2019-02-17 ENCOUNTER — Ambulatory Visit (INDEPENDENT_AMBULATORY_CARE_PROVIDER_SITE_OTHER): Payer: Medicare HMO | Admitting: Orthopaedic Surgery

## 2019-02-17 ENCOUNTER — Encounter: Payer: Self-pay | Admitting: Orthopaedic Surgery

## 2019-02-17 ENCOUNTER — Other Ambulatory Visit: Payer: Self-pay

## 2019-02-17 DIAGNOSIS — M1711 Unilateral primary osteoarthritis, right knee: Secondary | ICD-10-CM

## 2019-02-17 DIAGNOSIS — G8929 Other chronic pain: Secondary | ICD-10-CM | POA: Diagnosis not present

## 2019-02-17 DIAGNOSIS — M25562 Pain in left knee: Secondary | ICD-10-CM

## 2019-02-17 DIAGNOSIS — M170 Bilateral primary osteoarthritis of knee: Secondary | ICD-10-CM

## 2019-02-17 DIAGNOSIS — M1712 Unilateral primary osteoarthritis, left knee: Secondary | ICD-10-CM

## 2019-02-17 DIAGNOSIS — M25561 Pain in right knee: Secondary | ICD-10-CM

## 2019-02-17 MED ORDER — CLINDAMYCIN HCL 300 MG PO CAPS: 300.0000 mg | ORAL_CAPSULE | Freq: Three times a day (TID) | ORAL | 0 refills | Status: DC

## 2019-02-17 NOTE — Progress Notes (Signed)
Office Visit Note   Patient: [name]           Date of Birth: 1971/01/14           MRN: [id number] Visit Date: 02/17/2019              Requested by: Pecolia Ades, NP No address on file PCP: Pecolia Ades, NP   Assessment & Plan: Visit Diagnoses:  1. Unilateral primary osteoarthritis, right knee   2. Unilateral primary osteoarthritis, left knee   3. Chronic pain of left knee   4. Chronic pain of right knee     Plan: I am going to have her place Bactroban ointment on her wounds once to twice daily and clean them really well with antibacterial soapy water daily.  I will also send in some clindamycin for her.  I did place steroid injections in both knees today which are quite difficult given the severity of arthritis.  I have counseled her about weight loss and blood glucose control.  She understands we can inject her again in 3 to 4 months if needed.  Follow-Up Instructions: Return if symptoms worsen or fail to improve.   Orders:  No orders of the defined types were placed in this encounter.  Meds ordered this encounter  Medications  . clindamycin (CLEOCIN) 300 MG capsule    Sig: Take 1 capsule (300 mg total) by mouth 3 (three) times daily.    Dispense:  30 capsule    Refill:  0      Procedures: No procedures performed   Clinical Data: No additional findings.   Subjective: Chief Complaint  Patient presents with  . Left Knee - Follow-up, Pain  . Right Knee - Follow-up, Pain  The patient is well-known to me.  She is a 48 year old with severe debilitating end-stage arthritis of both knees.  She has been morbidly obese for a long period time but has lost 100s of pounds over the years.  She is still significantly obese.  She is also been a poorly controlled diabetic.  She is still working on that.  She is requesting steroid injections in her knees today due to the severity of her pain and arthritis.  She also want me to look at some wounds that are on  her abdomen that she said occurred while she was in [place] at [employer].  Her primary care physician is not available so she is hoping I can at least evaluate these to determine if she needs some type of antibiotic ointment and antibiotic.  HPI  Review of Systems She currently denies any headache, chest pain, shortness of breath, fever, chills, nausea, vomiting  Objective: Vital Signs: There were no vitals taken for this visit.  Physical Exam She is alert and orient x3 and in no acute distress Ortho Exam Examination of her knees shows severe arthritic changes in both knees with painful range of motion throughout both knees.  Examination of her abdomen shows some small bulla on the abdomen that are not weeping and appear to be scabbing over. Specialty Comments:  No specialty comments available.  Imaging: No results found.   PMFS History: Patient Active Problem List   Diagnosis Date Noted  . Postoperative abscess 10/27/2017  . Umbilical hernia, incarcerated, s/p repair 10/16/2017 10/16/2017  . Chronic pain of both knees 04/23/2017  . Unilateral primary osteoarthritis, left knee 10/24/2016  . Unilateral primary osteoarthritis, right knee 10/24/2016  . Abnormal mammogram with microcalcification-Right inner  lower quadrant 07/21/2013  . Oliguria and anuria 09/02/2012  . Constipation 03/02/2011  . Lethargy 03/01/2011  . Heel ulcer due to DM ([place]) 02/19/2011  . Wound, open, hip or thigh 02/19/2011  . Healing pressure ulcer stage III ([place]) 02/18/2011  . Morbidly obese ([place]) 02/18/2011  . Diabetes mellitus type 2 with complications, uncontrolled ([place]) 02/18/2011  . Sleep apnea 02/18/2011  . Hypothyroidism 02/18/2011  . Chronic pain 02/18/2011  . Acute lymphangitis 02/18/2011  . Cellulitis of foot 02/18/2011   Past Medical History:  Diagnosis Date  . Acute renal failure (ARF) ([place]) 09/02/2012  . Anemia   . Anxiety    panic attacks  . Back pain   . Blood transfusion   . Chronic  heel ulcer ([place])   . Chronic kidney disease   . Chronic pain syndrome   . Depression   . Diabetes mellitus    type II   . DJD (degenerative joint disease)   . Dysrhythmia    pt unsure what this was  . Fibromyalgia   . GERD (gastroesophageal reflux disease)   . H/O: Bell's palsy 2011  . Heart murmur    "slight one"  . History of kidney stones   . History of MRSA infection OF ULCER  . Hypertension    no longer taken since 10-11 months per patient at preop phone call of 10/13/2017   . Hypothyroidism   . Hypoventilation associated with obesity syndrome ([place])   . Migraine   . Non-healing non-surgical wound    Right hip, has Wound vac to hip.  Started as a skin tear.  . Peripheral vascular disease ([place])   . PONV (postoperative nausea and vomiting)    can be slow to wake up after surgery  . Right foot drop   . Shortness of breath    with Activity  . Sleep apnea    "study shows not bad enough for CPAP.", pt denies  . Umbilical hernia   . URI (upper respiratory infection) 03/05/2011    Family History  Problem Relation Age of Onset  . Diabetes type II Father   . Heart attack Father   . Peripheral vascular disease Father   . Diabetes type II Mother   . Anesthesia problems Mother   . Heart attack Mother   . Peripheral vascular disease Mother   . Hypertension Mother     Past Surgical History:  Procedure Laterality Date  . BACK SURGERY     for lumbar disc disease X2  . 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 BRAIN SURGERY      Plating due to soft spot closing too early- age 1  . BREAST LUMPECTOMY WITH NEEDLE LOCALIZATION Right 08/23/2013   Procedure: RIGHT BREAST LUMPECTOMY WITH NEEDLE LOCALIZATION;  Surgeon: Odis Hollingshead, MD;  Location: [facility];  Service: General;  Laterality: Right;  . CHOLECYSTECTOMY  1984  . EXCISION UMBILICAL NODULE N/A [id number]   Procedure: EXCISION OF UMBILICUS;  Surgeon: Coralie Keens, MD;  [facility];  Service: General;   Laterality: N/A;  . EYE SURGERY     eye lift  . I&D EXTREMITY  04/02/2011   Procedure: IRRIGATION AND DEBRIDEMENT EXTREMITY;  Surgeon: Mcarthur Rossetti;  Location: [facility];  Service: Orthopedics;  Laterality: Right;  I&D right heel ulcer, placement of A-cell graft  . INCISION AND DRAINAGE ABSCESS N/A 10/28/2017   Procedure: INCISION AND DRAINAGE UMBILICAL HERNIA ABSCESS;  Surgeon: Ralene Ok, MD;  Location:  [facility];  Service: General;  Laterality: N/A;  . INCISION AND DRAINAGE OF WOUND  08/29/2011   Procedure: IRRIGATION AND DEBRIDEMENT WOUND;  Surgeon: Theodoro Kos, DO;  Location: [facility];  Service: Plastics;  Laterality: Right;  . INSERTION OF MESH N/A 10/16/2017   Procedure: INSERTION OF MESH;  Surgeon: Coralie Keens, MD;  [facility];  Service: General;  Laterality: N/A;  . LITHOTRIPSY     2007ish  . right elbow    . UMBILICAL HERNIA REPAIR N/A 10/16/2017   Procedure: UMBILICAL HERNIA REPAIR WITH MESH;  Surgeon: Coralie Keens, MD;  [facility];  Service: General;  Laterality: N/A;   Social History   Occupational History  . Not on file  Tobacco Use  . Smoking status: Never Smoker  . Smokeless tobacco: Never Used  Substance and Sexual Activity  . Alcohol use: No  . Drug use: No  . Sexual activity: Never

## 2019-02-24 ENCOUNTER — Telehealth: Payer: Self-pay | Admitting: Orthopaedic Surgery

## 2019-02-24 MED ORDER — AMOXICILLIN 500 MG PO TABS: 500.0000 mg | ORAL_TABLET | Freq: Two times a day (BID) | ORAL | 0 refills | Status: DC

## 2019-02-24 NOTE — Telephone Encounter (Signed)
She said she can't take PCN but she CAN take Amoxic[place] if you want to change it

## 2019-02-24 NOTE — Telephone Encounter (Signed)
She needs to then stop the antibiotic and consult with her primary care physician about this.

## 2019-02-24 NOTE — Telephone Encounter (Signed)
Patient states she's having an allergic reaction(mouth sores, red blisters/spots) to the antibiotic she was given.

## 2019-02-24 NOTE — Telephone Encounter (Signed)
Amoxicillin is a penicillin but I sent it in for her anyway.  She may also want to pick up some type of oral medication to swish around in her mouth such as Listerine or any antifungals if she is having issues with blistering in her mouth.

## 2019-02-25 NOTE — Telephone Encounter (Signed)
done

## 2019-02-25 NOTE — Telephone Encounter (Signed)
Can you do me a huge favor and give her this message for me please

## 2019-03-03 ENCOUNTER — Ambulatory Visit: Payer: Medicare HMO | Admitting: Orthopaedic Surgery

## 2019-03-24 ENCOUNTER — Observation Stay (HOSPITAL_COMMUNITY)
Admission: EM | Admit: 2019-03-24 | Discharge: 2019-03-25 | Disposition: A | Discharge status: Home / Self Care | Payer: Medicare HMO | Attending: Internal Medicine | Admitting: Internal Medicine

## 2019-03-24 ENCOUNTER — Emergency Department (HOSPITAL_COMMUNITY): Payer: Medicare HMO

## 2019-03-24 ENCOUNTER — Encounter (HOSPITAL_COMMUNITY): Payer: Self-pay

## 2019-03-24 ENCOUNTER — Other Ambulatory Visit: Payer: Self-pay

## 2019-03-24 DIAGNOSIS — E118 Type 2 diabetes mellitus with unspecified complications: Secondary | ICD-10-CM

## 2019-03-24 DIAGNOSIS — F418 Other specified anxiety disorders: Secondary | ICD-10-CM

## 2019-03-24 DIAGNOSIS — E1151 Type 2 diabetes mellitus with diabetic peripheral angiopathy without gangrene: Secondary | ICD-10-CM | POA: Diagnosis not present

## 2019-03-24 DIAGNOSIS — R079 Chest pain, unspecified: Secondary | ICD-10-CM | POA: Diagnosis present

## 2019-03-24 DIAGNOSIS — Z794 Long term (current) use of insulin: Secondary | ICD-10-CM | POA: Diagnosis not present

## 2019-03-24 DIAGNOSIS — Z79890 Hormone replacement therapy (postmenopausal): Secondary | ICD-10-CM | POA: Diagnosis not present

## 2019-03-24 DIAGNOSIS — Z6841 Body Mass Index (BMI) 40.0 and over, adult: Secondary | ICD-10-CM | POA: Diagnosis not present

## 2019-03-24 DIAGNOSIS — E1122 Type 2 diabetes mellitus with diabetic chronic kidney disease: Secondary | ICD-10-CM | POA: Diagnosis not present

## 2019-03-24 DIAGNOSIS — E039 Hypothyroidism, unspecified: Secondary | ICD-10-CM | POA: Diagnosis not present

## 2019-03-24 DIAGNOSIS — N189 Chronic kidney disease, unspecified: Secondary | ICD-10-CM | POA: Insufficient documentation

## 2019-03-24 DIAGNOSIS — Z8616 Personal history of COVID-19: Secondary | ICD-10-CM | POA: Diagnosis present

## 2019-03-24 DIAGNOSIS — M797 Fibromyalgia: Secondary | ICD-10-CM | POA: Insufficient documentation

## 2019-03-24 DIAGNOSIS — F419 Anxiety disorder, unspecified: Secondary | ICD-10-CM | POA: Diagnosis not present

## 2019-03-24 DIAGNOSIS — M545 Low back pain: Secondary | ICD-10-CM | POA: Insufficient documentation

## 2019-03-24 DIAGNOSIS — R0789 Other chest pain: Principal | ICD-10-CM | POA: Insufficient documentation

## 2019-03-24 DIAGNOSIS — R0602 Shortness of breath: Secondary | ICD-10-CM | POA: Insufficient documentation

## 2019-03-24 DIAGNOSIS — IMO0002 Reserved for concepts with insufficient information to code with codable children: Secondary | ICD-10-CM | POA: Diagnosis present

## 2019-03-24 DIAGNOSIS — K219 Gastro-esophageal reflux disease without esophagitis: Secondary | ICD-10-CM | POA: Insufficient documentation

## 2019-03-24 DIAGNOSIS — M25562 Pain in left knee: Secondary | ICD-10-CM | POA: Insufficient documentation

## 2019-03-24 DIAGNOSIS — Z79899 Other long term (current) drug therapy: Secondary | ICD-10-CM | POA: Insufficient documentation

## 2019-03-24 DIAGNOSIS — F410 Panic disorder [episodic paroxysmal anxiety] without agoraphobia: Secondary | ICD-10-CM | POA: Insufficient documentation

## 2019-03-24 DIAGNOSIS — G43909 Migraine, unspecified, not intractable, without status migrainosus: Secondary | ICD-10-CM | POA: Diagnosis not present

## 2019-03-24 DIAGNOSIS — F329 Major depressive disorder, single episode, unspecified: Secondary | ICD-10-CM | POA: Insufficient documentation

## 2019-03-24 DIAGNOSIS — M25561 Pain in right knee: Secondary | ICD-10-CM | POA: Diagnosis not present

## 2019-03-24 DIAGNOSIS — Z8619 Personal history of other infectious and parasitic diseases: Secondary | ICD-10-CM

## 2019-03-24 DIAGNOSIS — E119 Type 2 diabetes mellitus without complications: Secondary | ICD-10-CM | POA: Diagnosis present

## 2019-03-24 DIAGNOSIS — E1165 Type 2 diabetes mellitus with hyperglycemia: Secondary | ICD-10-CM | POA: Diagnosis present

## 2019-03-24 DIAGNOSIS — G4730 Sleep apnea, unspecified: Secondary | ICD-10-CM | POA: Diagnosis not present

## 2019-03-24 DIAGNOSIS — F32A Depression, unspecified: Secondary | ICD-10-CM | POA: Diagnosis present

## 2019-03-24 DIAGNOSIS — I129 Hypertensive chronic kidney disease with stage 1 through stage 4 chronic kidney disease, or unspecified chronic kidney disease: Secondary | ICD-10-CM | POA: Insufficient documentation

## 2019-03-24 DIAGNOSIS — G8929 Other chronic pain: Secondary | ICD-10-CM

## 2019-03-24 DIAGNOSIS — G894 Chronic pain syndrome: Secondary | ICD-10-CM | POA: Insufficient documentation

## 2019-03-24 LAB — COMPREHENSIVE METABOLIC PANEL
ALT: 39 U/L (ref 0–44)
AST: 49 U/L — ABNORMAL HIGH (ref 15–41)
Albumin: 3.7 g/dL (ref 3.5–5.0)
Alkaline Phosphatase: 110 U/L (ref 38–126)
Anion gap: 9 (ref 5–15)
BUN: 17 mg/dL (ref 6–20)
CO2: 22 mmol/L (ref 22–32)
Calcium: 8.9 mg/dL (ref 8.9–10.3)
Chloride: 102 mmol/L (ref 98–111)
Creatinine, Ser: 1.04 mg/dL — ABNORMAL HIGH (ref 0.44–1.00)
GFR calc Af Amer: >60 mL/min (ref >60)
GFR calc non Af Amer: >60 mL/min (ref >60)
Glucose, Bld: 226 mg/dL — ABNORMAL HIGH (ref 70–99)
Potassium: 4 mmol/L (ref 3.5–5.1)
Sodium: 133 mmol/L — ABNORMAL LOW (ref 135–145)
Total Bilirubin: 0.5 mg/dL (ref 0.3–1.2)
Total Protein: 6.9 g/dL (ref 6.5–8.1)

## 2019-03-24 LAB — CBC
HCT: 45.8 % (ref 36.0–46.0)
Hemoglobin: 14.8 g/dL (ref 12.0–15.0)
MCH: 30.3 pg (ref 26.0–34.0)
MCHC: 32.3 g/dL (ref 30.0–36.0)
MCV: 93.7 fL (ref 80.0–100.0)
Platelets: 191 10*3/uL (ref 150–400)
RBC: 4.89 MIL/uL (ref 3.87–5.11)
RDW: 13.8 % (ref 11.5–15.5)
WBC: 8.9 10*3/uL (ref 4.0–10.5)
nRBC: 0 % (ref 0.0–0.2)

## 2019-03-24 LAB — BRAIN NATRIURETIC PEPTIDE: B Natriuretic Peptide: 23 pg/mL (ref 0.0–100.0)

## 2019-03-24 LAB — TROPONIN I (HIGH SENSITIVITY)
Troponin I (High Sensitivity): 12 ng/L (ref <18)
Troponin I (High Sensitivity): 12 ng/L (ref <18)

## 2019-03-24 IMAGING — CR DG CHEST 2V
2 series · 2 of 2 positions shown · non-contrast
Comparison: [DATE]

CLINICAL DATA: Shortness of breath, hypoxia

EXAM:
CHEST - 2 VIEW

[chest pa]
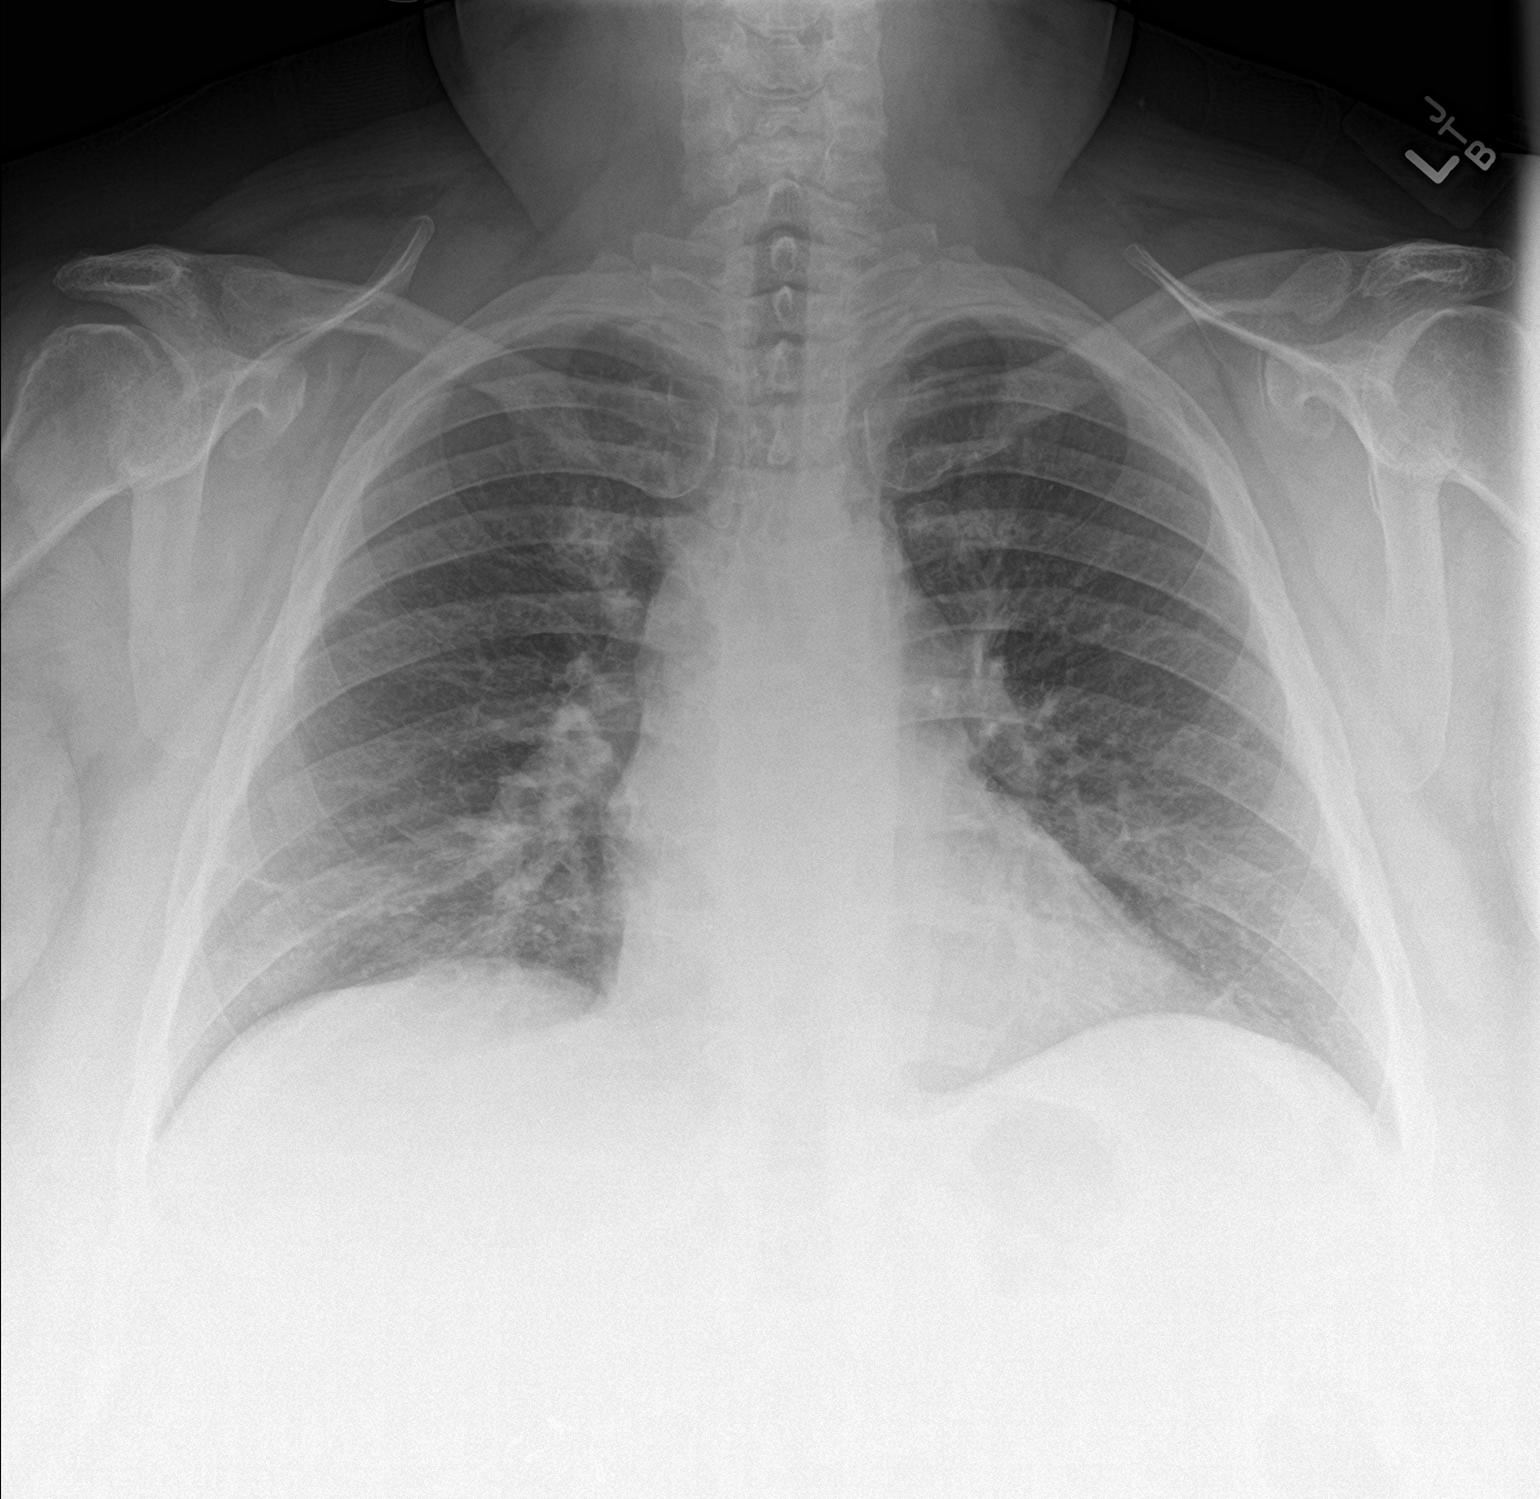

[chest lat]
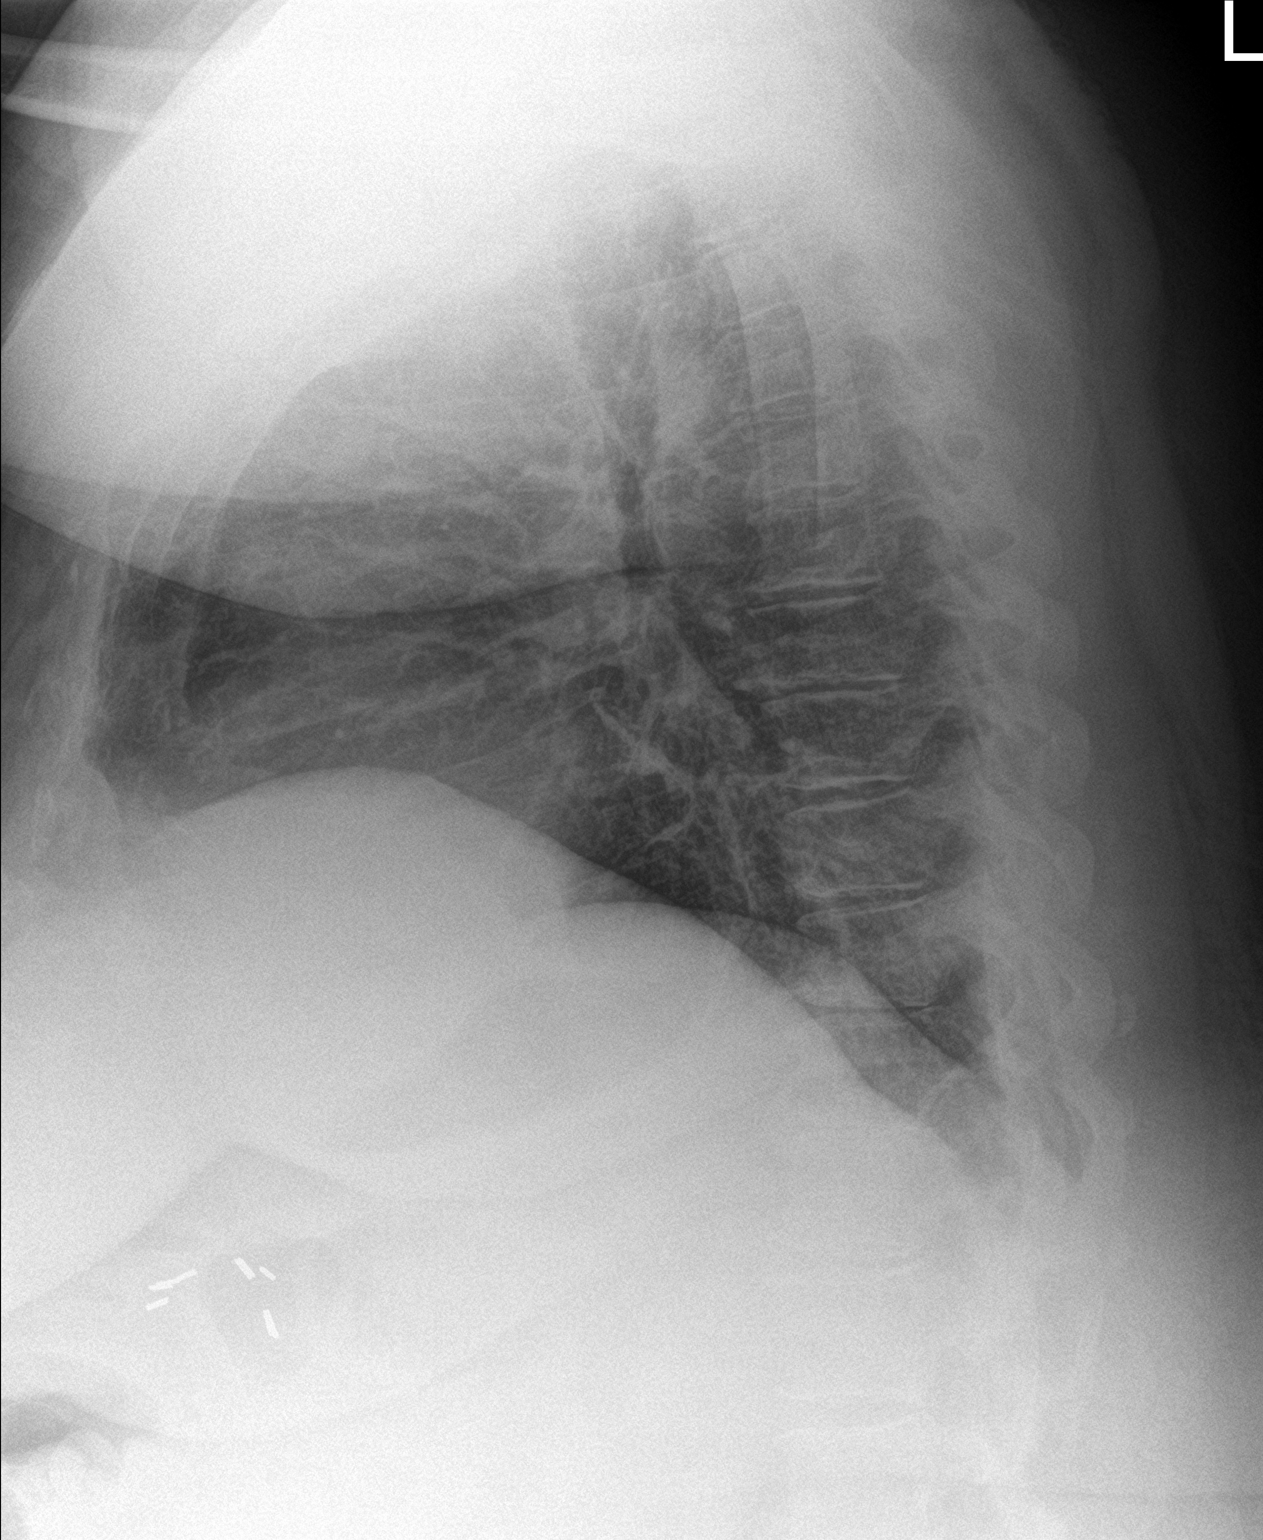

[2 of 2 positions shown; findings below may reference images not displayed]

FINDINGS: The heart size and mediastinal contours are within normal limits.
Minimal linear atelectasis within the bilateral lung bases. No focal
airspace consolidation, pleural effusion, or pneumothorax. The
visualized skeletal structures are unremarkable.
IMPRESSION: No active cardiopulmonary disease.

## 2019-03-24 IMAGING — CT CT ANGIO CHEST
2 of 6 series · 18 of 36 positions shown · IV contrast (omnipaque)
Comparison: Chest x-ray [DATE], CT chest [DATE]

CLINICAL DATA: Shortness of breath history of COVID

EXAM:
CT ANGIOGRAPHY CHEST WITH CONTRAST
TECHNIQUE: Multidetector CT imaging of the chest was performed using the
standard protocol during bolus administration of intravenous
contrast. Multiplanar CT image reconstructions and MIPs were
obtained to evaluate the vascular anatomy.
CONTRAST:  75mL OMNIPAQUE IOHEXOL 350 MG/ML SOLN

[Series 7: pe thins · axial · 0.77mm/px · z∈[+1205,+1421]mm · 17 of 345 slices shown]
[im 18/345  lung]
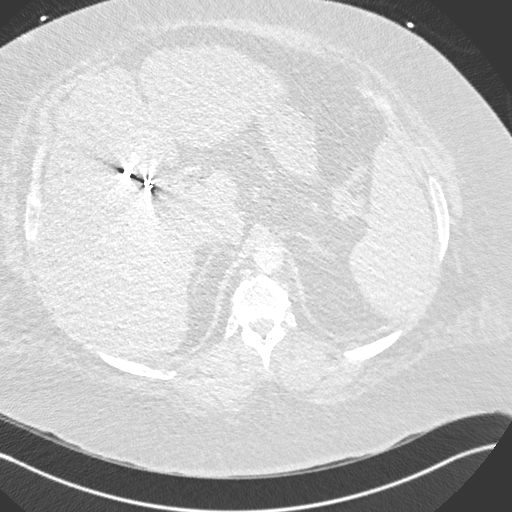
[im 35/345  mediastinal]
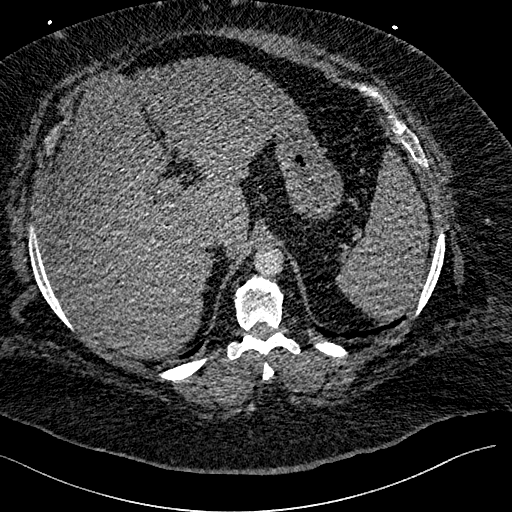
[im 52/345  lung]
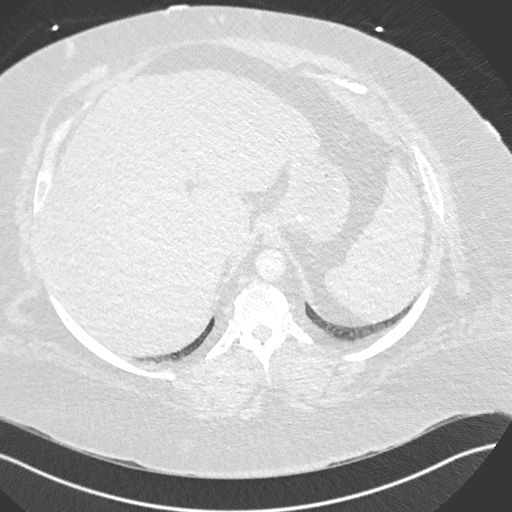
[im 69/345  mediastinal]
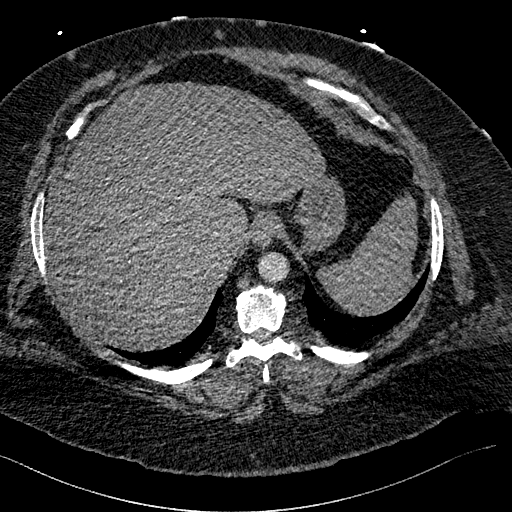
[im 104/345  lung]
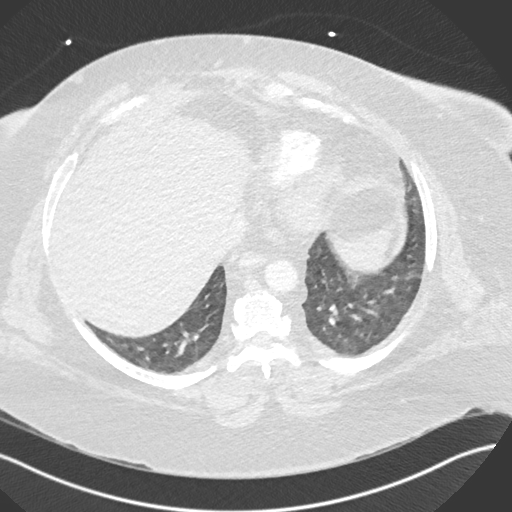
[im 121/345  mediastinal]
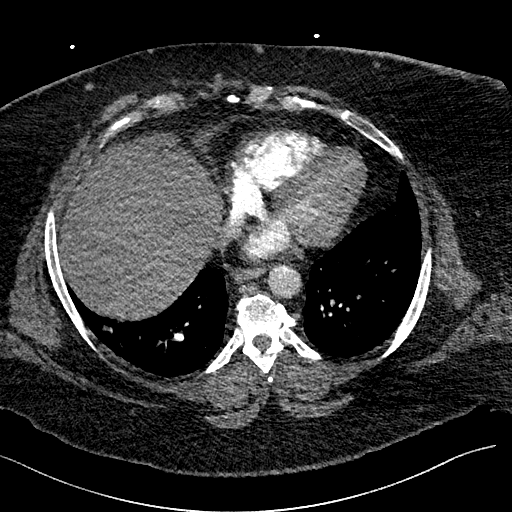
[im 138/345  lung]
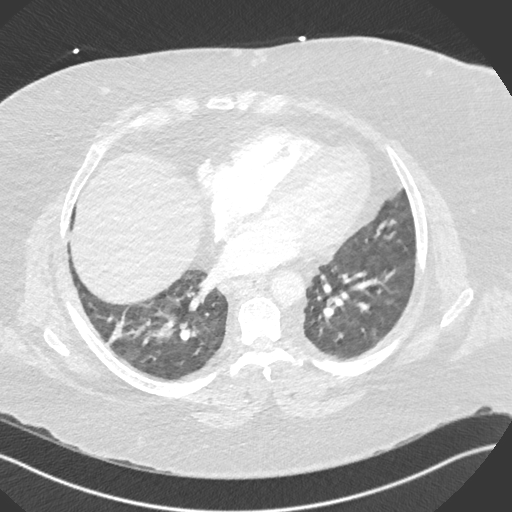
[im 155/345  mediastinal]
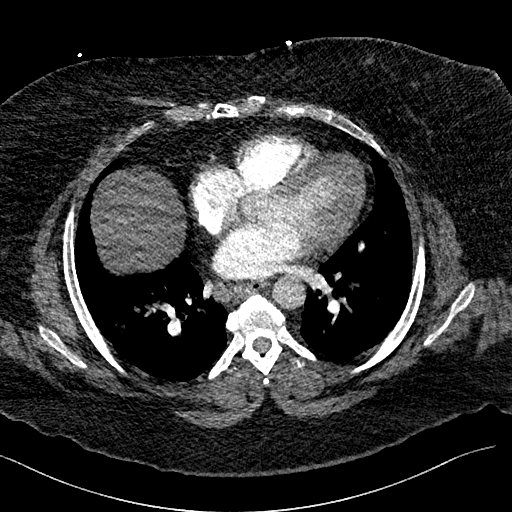
[im 173/345  lung]
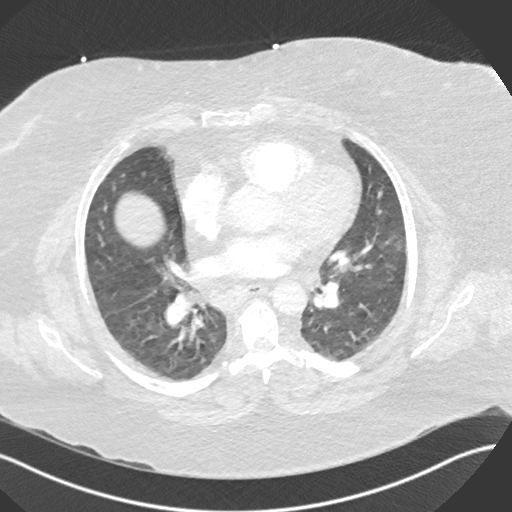
[im 190/345  mediastinal]
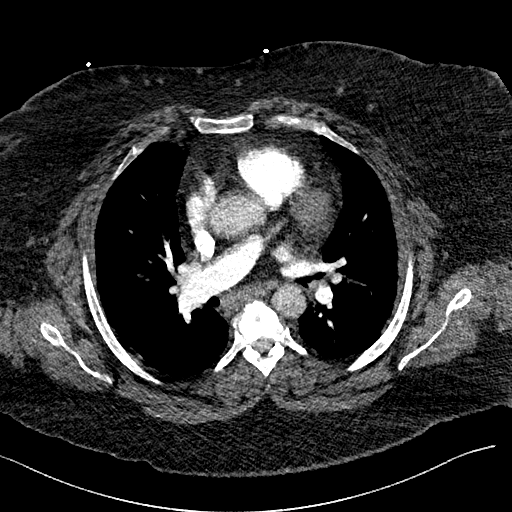
[im 207/345  lung]
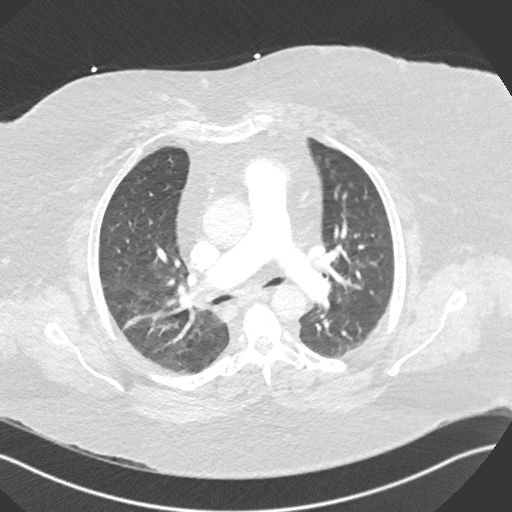
[im 224/345  mediastinal]
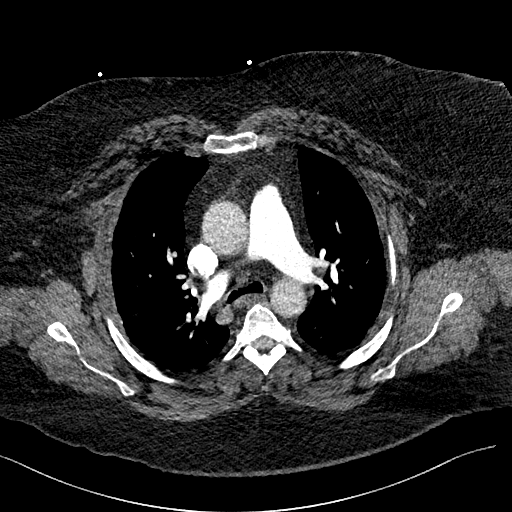
[im 241/345  lung]
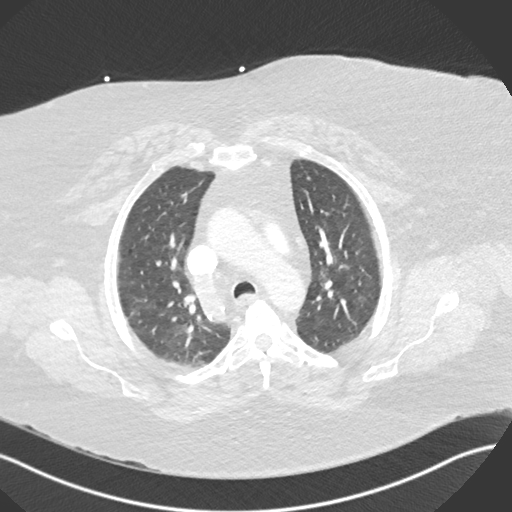
[im 276/345  mediastinal]
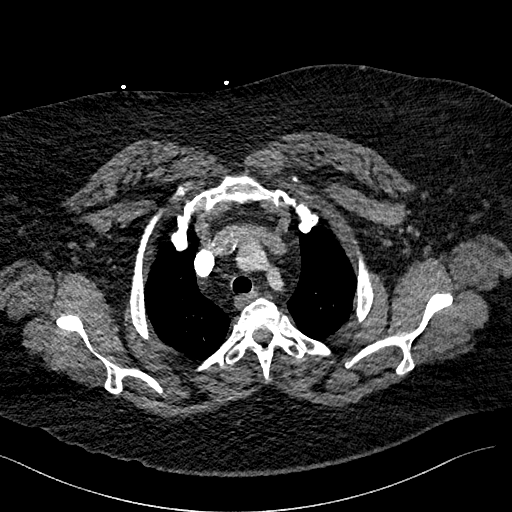
[im 293/345  lung]
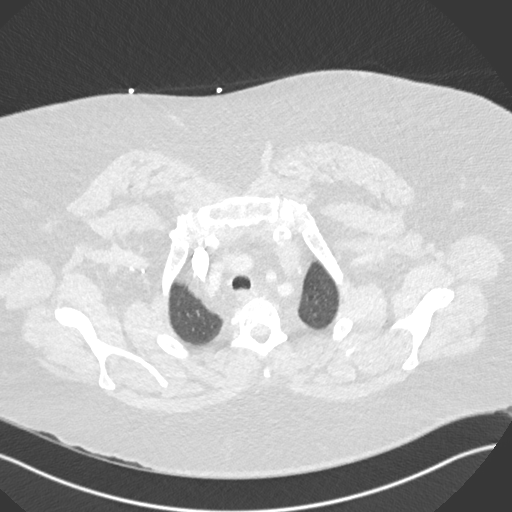
[im 310/345  mediastinal]
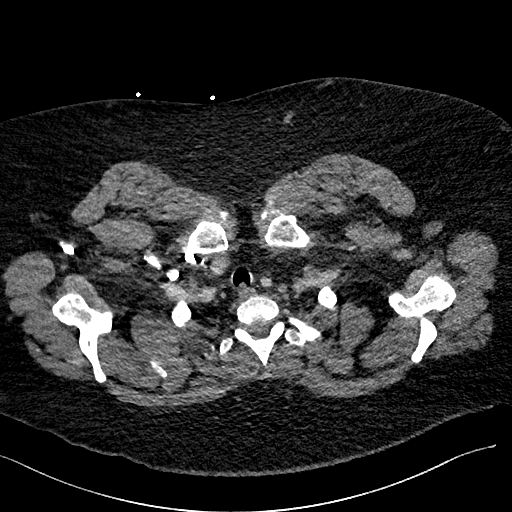
[im 327/345  lung]
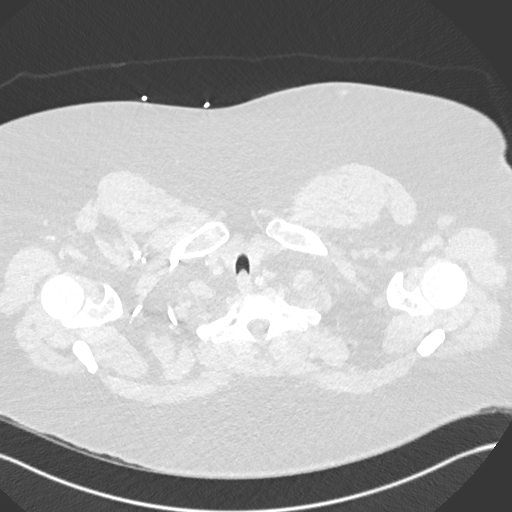

[Series 8: pe 2mm cor · coronal · 0.48mm/px · 1 of 155 slices shown]
[im 78/155  mediastinal]
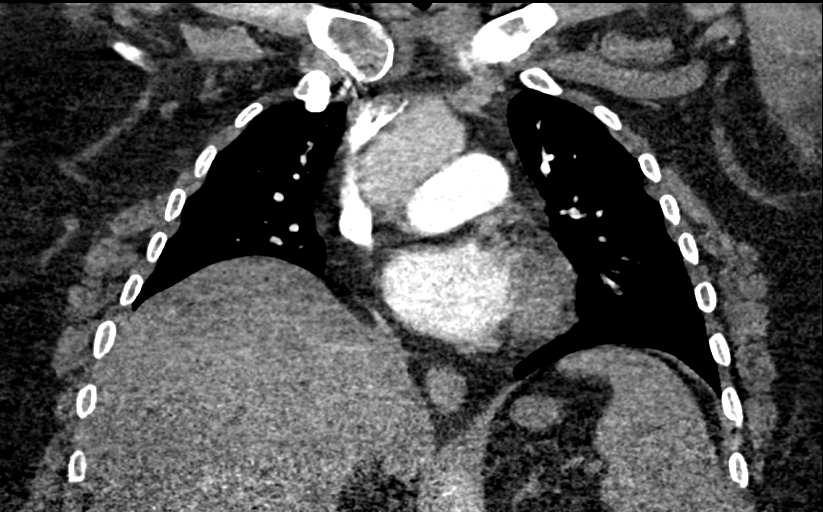

[18 of 36 positions shown; findings below may reference images not displayed]

FINDINGS: Cardiovascular: Satisfactory opacification of the pulmonary arteries
to the segmental level. Limited evaluation for distal PE, mostly at
the right lower lobe secondary to respiratory motion and poor vessel
opacifications. No acute central embolus is seen. Aorta is
nonaneurysmal. The heart size is within normal limits. No
pericardial effusion

Mediastinum/Nodes: Midline trachea. No thyroid mass. Borderline
right paratracheal nodes up to 1 cm. Esophagus normal

Lungs/Pleura: No pleural effusion or pneumothorax. Hazy and linear
densities in the right upper and lower lobes favored to represent
subsegmental atelectasis.

Upper Abdomen: No acute abnormality.

Musculoskeletal: No acute or suspicious osseous abnormality.

Review of the MIP images confirms the above findings.
IMPRESSION: 1. Limited evaluation for distal PE in the lower lobes due to motion
and poor vessel opacification. No acute central embolus is seen.
2. Hazy and linear densities in the right upper and bilateral lower
lobes favored to represent atelectasis. No focal pneumonia is seen.

## 2019-03-24 MED ORDER — HYDROMORPHONE HCL 1 MG/ML IJ SOLN
0.5000 mg | Freq: Once | INTRAMUSCULAR | Status: AC
  Administered 2019-03-24: 0.5 mg via INTRAVENOUS
  Filled 2019-03-24: qty 1

## 2019-03-24 MED ORDER — FENTANYL CITRATE (PF) 100 MCG/2ML IJ SOLN
50.0000 ug | Freq: Once | INTRAMUSCULAR | Status: AC
  Administered 2019-03-25: 50 ug via INTRAVENOUS
  Filled 2019-03-24: qty 2

## 2019-03-24 MED ORDER — ONDANSETRON HCL 4 MG/2ML IJ SOLN: 4.0000 mg | Freq: Four times a day (QID) | INTRAMUSCULAR | Status: DC | PRN

## 2019-03-24 MED ORDER — MORPHINE SULFATE (PF) 2 MG/ML IV SOLN
2.0000 mg | INTRAVENOUS | Status: DC | PRN
  Administered 2019-03-25: 2 mg via INTRAVENOUS
  Filled 2019-03-24: qty 1

## 2019-03-24 MED ORDER — IOHEXOL 350 MG/ML SOLN
75.0000 mL | Freq: Once | INTRAVENOUS | Status: AC | PRN
  Administered 2019-03-24: 75 mL via INTRAVENOUS

## 2019-03-24 NOTE — ED Notes (Signed)
While laying, pt O2sat was 93. Upon pt standing, O2sat elevated to 97. As pt ambulated around the pt room (for approximately 2 minutes due to SOB and knee pain), O2sat remained between 99 and 100. After sitting back down, pt O2sat dropped back down and remained at 93.

## 2019-03-24 NOTE — ED Triage Notes (Signed)
Pt arrives POV for eval of shortness of breath. Pt was recently dx'd w/ covid on 12/13, states she had another test since that was negative. Pt reports she went to PCP today for ongoing SOB and CXR, pt states that they sent her over here for further eval of CT to r/o PE. Per note from PCP, pt was hypoxic to 89% while speaking, and tachy to 1teens.

## 2019-03-24 NOTE — ED Provider Notes (Signed)
[facility] EMERGENCY DEPARTMENT Provider Note   CSN: [id number] Arrival date & time: 03/24/19  1503     History Chief Complaint  Patient presents with  . Shortness of Breath    [name] is a 48 y.o. female.  Presents to the emergency department with chief complaint of shortness of breath.  Patient states she was diagnosed with Covid at beginning of the month, experienced myalgias, cough, some difficulty in breathing at the time but her symptoms completely resolved.  States she had a test since then that was negative.  Over the past few days she has had worsening fatigue, cough, shortness of breath as well as some chest pain and back pain.  States that breathing is worse with any significant exertion.  States chest pain feels like a tightness, worse with deep breath.  Currently not having any chest pain.  States she has a fentanyl patch for chronic back pain and this seems to help with her pain.  Denies any known coronary artery disease history, states both her parents had history of CAD in their mid 32s.  Patient has history of morbid obesity, diabetes  HPI     Past Medical History:  Diagnosis Date  . Acute renal failure (ARF) ([place]) 09/02/2012  . Anemia   . Anxiety    panic attacks  . Back pain   . Blood transfusion   . Chronic heel ulcer ([place])   . Chronic kidney disease   . Chronic pain syndrome   . Depression   . Diabetes mellitus    type II   . DJD (degenerative joint disease)   . Dysrhythmia    pt unsure what this was  . Fibromyalgia   . GERD (gastroesophageal reflux disease)   . H/O: Bell's palsy 2011  . Heart murmur    "slight one"  . History of kidney stones   . History of MRSA infection OF ULCER  . Hypertension    no longer taken since 10-11 months per patient at preop phone call of 10/13/2017   . Hypothyroidism   . Hypoventilation associated with obesity syndrome ([place])   . Migraine   . Non-healing non-surgical wound    Right  hip, has Wound vac to hip.  Started as a skin tear.  . Peripheral vascular disease ([place])   . PONV (postoperative nausea and vomiting)    can be slow to wake up after surgery  . Right foot drop   . Shortness of breath    with Activity  . Sleep apnea    "study shows not bad enough for CPAP.", pt denies  . Umbilical hernia   . URI (upper respiratory infection) 03/05/2011    Patient Active Problem List   Diagnosis Date Noted  . Postoperative abscess 10/27/2017  . Umbilical hernia, incarcerated, s/p repair 10/16/2017 10/16/2017  . Chronic pain of both knees 04/23/2017  . Unilateral primary osteoarthritis, left knee 10/24/2016  . Unilateral primary osteoarthritis, right knee 10/24/2016  . Abnormal mammogram with microcalcification-Right inner lower quadrant 07/21/2013  . Oliguria and anuria 09/02/2012  . Constipation 03/02/2011  . Lethargy 03/01/2011  . Heel ulcer due to DM ([place]) 02/19/2011  . Wound, open, hip or thigh 02/19/2011  . Healing pressure ulcer stage III ([place]) 02/18/2011  . Morbidly obese ([place]) 02/18/2011  . Diabetes mellitus type 2 with complications, uncontrolled ([place]) 02/18/2011  . Sleep apnea 02/18/2011  . Hypothyroidism 02/18/2011  . Chronic pain 02/18/2011  . Acute lymphangitis 02/18/2011  . Cellulitis  of foot 02/18/2011    Past Surgical History:  Procedure Laterality Date  . BACK SURGERY     for lumbar disc disease X2  . [place]   Tumor removed- has steel plate  . BRAIN SURGERY      Plating due to soft spot closing too early- age 70  . BREAST LUMPECTOMY WITH NEEDLE LOCALIZATION Right 08/23/2013   Procedure: RIGHT BREAST LUMPECTOMY WITH NEEDLE LOCALIZATION;  Surgeon: Odis Hollingshead, MD;  Location: [facility];  Service: General;  Laterality: Right;  . CHOLECYSTECTOMY  1984  . EXCISION UMBILICAL NODULE N/A 3/53/2992   Procedure: EXCISION OF UMBILICUS;  Surgeon: Coralie Keens, MD;  Location: [facility];  Service: General;  Laterality: N/A;  . EYE SURGERY      eye lift  . I & D EXTREMITY  04/02/2011   Procedure: IRRIGATION AND DEBRIDEMENT EXTREMITY;  Surgeon: Mcarthur Rossetti;  Location: [facility];  Service: Orthopedics;  Laterality: Right;  I&D right heel ulcer, placement of A-cell graft  . INCISION AND DRAINAGE ABSCESS N/A 10/28/2017   Procedure: INCISION AND DRAINAGE UMBILICAL HERNIA ABSCESS;  Surgeon: Ralene Ok, MD;  Location: [facility];  Service: General;  Laterality: N/A;  . INCISION AND DRAINAGE OF WOUND  08/29/2011   Procedure: IRRIGATION AND DEBRIDEMENT WOUND;  Surgeon: Theodoro Kos, DO;  Location: [facility];  Service: Plastics;  Laterality: Right;  . INSERTION OF MESH N/A 10/16/2017   Procedure: INSERTION OF MESH;  Surgeon: Coralie Keens, MD;  Location: [facility];  Service: General;  Laterality: N/A;  . LITHOTRIPSY     2007ish  . right elbow    . UMBILICAL HERNIA REPAIR N/A 10/16/2017   Procedure: UMBILICAL HERNIA REPAIR WITH MESH;  Surgeon: Coralie Keens, MD;  Location: [facility];  Service: General;  Laterality: N/A;     OB History   No obstetric history on file.     Family History  Problem Relation Age of Onset  . Diabetes type II Father   . Heart attack Father   . Peripheral vascular disease Father   . Diabetes type II Mother   . Anesthesia problems Mother   . Heart attack Mother   . Peripheral vascular disease Mother   . Hypertension Mother     Social History   Tobacco Use  . Smoking status: Never Smoker  . Smokeless tobacco: Never Used  Substance Use Topics  . Alcohol use: No  . Drug use: No    Home Medications Prior to Admission medications   Medication Sig Start Date End Date Taking? Authorizing Provider  acetaminophen (TYLENOL) 500 MG tablet Take 1,000 mg by mouth 2 (two) times daily as needed for moderate pain or headache.    [provider]  ALPRAZolam Duanne Moron) 1 MG tablet Take 1 mg by mouth 2 (two) times daily as needed for anxiety.    [provider]  amoxicillin (AMOXIL) 500 MG tablet  Take 1 tablet (500 mg total) by mouth 2 (two) times daily. 02/24/19   Mcarthur Rossetti, MD  atorvastatin (LIPITOR) 20 MG tablet Take 20 mg by mouth daily.    [provider]  Biotin 10000 MCG TABS Take 10,000 mcg by mouth daily.    [provider]  carisoprodol (SOMA) 350 MG tablet Take 350 mg by mouth 5 (five) times daily as needed for muscle spasms.     [provider]  cetirizine (ZYRTEC) 10 MG tablet Take 10 mg by mouth daily as needed for allergies.  [provider]  clindamycin (CLEOCIN) 300 MG capsule Take 1 capsule (300 mg total) by mouth 3 (three) times daily. 02/17/19   Mcarthur Rossetti, MD  Cyanocobalamin (VITAMIN B-12 IJ) Inject 1 each as directed every 30 (thirty) days.     [provider]  Diclofenac Sodium 2 % SOLN Place 2-4 g onto the skin 4 (four) times daily as needed. 10/08/18   Mcarthur Rossetti, MD  DULoxetine (CYMBALTA) 60 MG capsule Take 120 mg by mouth daily.     [provider]  eletriptan (RELPAX) 40 MG tablet Take 40 mg by mouth as needed for migraine or headache. May repeat in 2 hours if headache persists or recurs.    [provider]  esomeprazole (NEXIUM) 40 MG capsule Take 40 mg by mouth daily before breakfast. Name Brand Only    [provider]  fentaNYL (DURAGESIC - DOSED MCG/HR) 75 MCG/HR Place 75 mcg onto the skin every 3 (three) days.     [provider]  furosemide (LASIX) 20 MG tablet Take 20 mg by mouth daily as needed for fluid.     [provider]  gabapentin (NEURONTIN) 600 MG tablet Take 1,200 mg by mouth 3 (three) times daily.    [provider]  hydrOXYzine (VISTARIL) 25 MG capsule Take 25 mg by mouth 3 (three) times daily as needed for anxiety.    [provider]  insulin glargine (LANTUS) 100 UNIT/ML injection Inject 0.44 mLs (44 Units total) into the skin at bedtime. 11/06/17   Coralie Keens, MD  insulin glargine (LANTUS)  100 UNIT/ML injection Inject 0.35 mLs (35 Units total) into the skin 2 (two) times daily. 11/06/17   Coralie Keens, MD  JARDIANCE 10 MG TABS tablet TK 1 T PO QD 11/18/18   [provider]  levothyroxine (SYNTHROID, LEVOTHROID) 125 MCG tablet Take 125 mcg by mouth daily.    [provider]  lidocaine (LIDODERM) 5 % Place 1-3 patches onto the skin daily. ONE PATCH ON EACH KNEE AND ONE PATCH ON THE LOWER BACK 05/30/17   [provider]  Liraglutide (VICTOZA) 18 MG/3ML SOLN Inject 1.8 mg into the skin every morning.     [provider]  milk thistle 175 MG tablet Take 350 mg by mouth daily.    [provider]  NARCAN 4 MG/0.1ML LIQD nasal spray kit PLACE 1 SPRAY IN 1 NOSTRIL FOR ACCIDENTAL OVERDOSE. MAY REPEAT Q 2 TO 3 MIN IF PATIENT UNRESPONSIVE. 10/22/17   [provider]  nystatin (MYCOSTATIN/NYSTOP) 100000 UNIT/GM POWD Apply 1 g topically daily as needed (blemishes).     [provider]  omega-3 acid ethyl esters (LOVAZA) 1 g capsule Take 2 g by mouth daily.    [provider]  oxyCODONE (ROXICODONE) 15 MG immediate release tablet Take 15 mg by mouth every 6 (six) hours as needed (MODERATE PAIN).     [provider]  Prenatal Vit-Fe Fumarate-FA (PRENATAL PLUS PO) Take 1 tablet by mouth daily.    [provider]  promethazine (PHENERGAN) 25 MG tablet Take 25 mg by mouth every 6 (six) hours as needed for nausea or vomiting.    [provider]  senna (SENOKOT) 8.6 MG TABS tablet Take 8.6 mg by mouth daily as needed for mild constipation.     [provider]  sitaGLIPtin (JANUVIA) 50 MG tablet Take 50 mg by mouth daily.    [provider]  SUMAtriptan (IMITREX) 100 MG tablet Take 100 mg  by mouth daily as needed for migraine.    [provider]  topiramate (TOPAMAX) 200 MG tablet Take 200 mg by mouth 2 (two) times daily.    [provider]  TRULICITY 1.5 JO/8.7OM SOPN INJ  [place] ONCE A WK UTD 11/24/18   [provider]    Allergies    Penicillins, Clindamycin/lincomycin, Nsaids, Sulfa antibiotics, and Versed [midazolam]  Review of Systems   Review of Systems  Constitutional: Positive for fatigue. Negative for chills and fever.  HENT: Negative for ear pain and sore throat.   Eyes: Negative for pain and visual disturbance.  Respiratory: Positive for cough and shortness of breath.   Cardiovascular: Positive for chest pain. Negative for palpitations.  Gastrointestinal: Negative for abdominal pain and vomiting.  Genitourinary: Negative for dysuria and hematuria.  Musculoskeletal: Positive for back pain. Negative for arthralgias.  Skin: Negative for color change and rash.  Neurological: Negative for seizures and syncope.  All other systems reviewed and are negative.   Physical Exam Updated Vital Signs BP (!) 147/98   Pulse 87   Temp 98.4 F (36.9 C) (Oral)   Resp 16   Ht 5' 5" (1.651 m)   Wt (!) 148.3 kg   SpO2 94%   BMI 54.42 kg/m   Physical Exam Vitals and nursing note reviewed.  Constitutional:      Appearance: She is well-developed.  HENT:     Head: Normocephalic and atraumatic.  Cardiovascular:     Rate and Rhythm: Regular rhythm. Tachycardia present.  Pulmonary:     Effort: Pulmonary effort is normal. No respiratory distress.     Comments: Mild tachypnea but no distress, clear to ascultation b/l Chest:     Chest wall: No mass or deformity.  Abdominal:     General: Bowel sounds are normal.     Palpations: Abdomen is soft.  Musculoskeletal:        General: Normal range of motion.     Cervical back: Normal range of motion and neck supple.  Skin:    General: Skin is warm and dry.     Capillary Refill: Capillary refill takes less than 2 seconds.  Neurological:     General: No focal deficit present.     Mental Status: She is alert.  Psychiatric:        Mood and Affect: Mood normal.        Behavior: Behavior normal.     ED  Results / Procedures / Treatments   Labs (all labs ordered are listed, but only abnormal results are displayed) Labs Reviewed  COMPREHENSIVE METABOLIC PANEL - Abnormal; Notable for the following components:      Result Value   Sodium 133 (*)    Glucose, Bld 226 (*)    Creatinine, Ser 1.04 (*)    AST 49 (*)    All other components within normal limits  CBC  BRAIN NATRIURETIC PEPTIDE  TROPONIN I (HIGH SENSITIVITY)  TROPONIN I (HIGH SENSITIVITY)    EKG EKG Interpretation  Date/Time:  Wednesday March 24 2019 15:11:42 EST Ventricular Rate:  110 PR Interval:  182 QRS Duration: 86 QT Interval:  312 QTC Calculation: 422 R Axis:   8 Text Interpretation: Sinus tachycardia Low voltage QRS Cannot rule out Anterior infarct , age undetermined Abnormal ECG Confirmed by Madalyn Rob [phone]) on 03/24/2019 8:25:57 PM   Radiology DG Chest 2 View  Result Date: 03/24/2019 CLINICAL DATA:  Shortness of breath, hypoxia EXAM: CHEST - 2 VIEW COMPARISON:  08/19/2013 FINDINGS:  The heart size and mediastinal contours are within normal limits. Minimal linear atelectasis within the bilateral lung bases. No focal airspace consolidation, pleural effusion, or pneumothorax. The visualized skeletal structures are unremarkable. IMPRESSION: No active cardiopulmonary disease. Electronically Signed   By: Davina Poke D.O.   On: 03/24/2019 15:58   CT Angio Chest PE W and/or Wo Contrast  Result Date: 03/24/2019 CLINICAL DATA:  Shortness of breath history of COVID EXAM: CT ANGIOGRAPHY CHEST WITH CONTRAST TECHNIQUE: Multidetector CT imaging of the chest was performed using the standard protocol during bolus administration of intravenous contrast. Multiplanar CT image reconstructions and MIPs were obtained to evaluate the vascular anatomy. CONTRAST:  78m OMNIPAQUE IOHEXOL 350 MG/ML SOLN COMPARISON:  Chest x-ray 03/24/2019, CT chest 12/30/2003 FINDINGS: Cardiovascular: Satisfactory opacification of the  pulmonary arteries to the segmental level. Limited evaluation for distal PE, mostly at the right lower lobe secondary to respiratory motion and poor vessel opacifications. No acute central embolus is seen. Aorta is nonaneurysmal. The heart size is within normal limits. No pericardial effusion Mediastinum/Nodes: Midline trachea. No thyroid mass. Borderline right paratracheal nodes up to 1 cm. Esophagus normal Lungs/Pleura: No pleural effusion or pneumothorax. Hazy and linear densities in the right upper and lower lobes favored to represent subsegmental atelectasis. Upper Abdomen: No acute abnormality. Musculoskeletal: No acute or suspicious osseous abnormality. Review of the MIP images confirms the above findings. IMPRESSION: 1. Limited evaluation for distal PE in the lower lobes due to motion and poor vessel opacification. No acute central embolus is seen. 2. Hazy and linear densities in the right upper and bilateral lower lobes favored to represent atelectasis. No focal pneumonia is seen. Electronically Signed   By: KDonavan FoilM.D.   On: 03/24/2019 23:13    Procedures Procedures (including critical care time)  Medications Ordered in ED Medications  fentaNYL (SUBLIMAZE) injection 50 mcg (has no administration in time range)  HYDROmorphone (DILAUDID) injection 0.5 mg (0.5 mg Intravenous Given 03/24/19 2106)  iohexol (OMNIPAQUE) 350 MG/ML injection 75 mL (75 mLs Intravenous Contrast Given 03/24/19 2212)    ED Course  I have reviewed the triage vital signs and the nursing notes.  Pertinent labs & imaging results that were available during my care of the patient were reviewed by me and considered in my medical decision making (see chart for details).  Clinical Course as of Mar 23 2337  Wed Mar 24, 2019  2025 Completed chart review - sent by PCP with concern for PE, will go assess patient, check CTA to r/o PE   [RD]  2052 Assessed patient   [RD]    Clinical Course User Index [RD] DLucrezia Starch MD   MDM Rules/Calculators/A&P                       48year old lady who presents to ER with concern for shortness of breath, chest pain.  Diagnosed earlier in the month with COVID-19 but had recovered from illness.  EKG without acute ischemic changes, initial troponin within normal limits.  CTA chest ordered to evaluate for PE, negative for acute PE.  Patient continuing to have some chest pain or difficulty in breathing.  Though vital signs remained stable.  Patient has risk factors for cardiac disease including obesity, diabetes, family history.  Believe she will benefit from further observation, chest pain work-up.  Will consult hospitalist for admission.  Final Clinical Impression(s) / ED Diagnoses Final diagnoses:  Chest pain, unspecified type    Rx /  DC Orders ED Discharge Orders    None       Lucrezia Starch, MD 03/24/19 [phone]

## 2019-03-25 ENCOUNTER — Encounter (HOSPITAL_COMMUNITY): Payer: Self-pay | Admitting: Family Medicine

## 2019-03-25 ENCOUNTER — Observation Stay (HOSPITAL_BASED_OUTPATIENT_CLINIC_OR_DEPARTMENT_OTHER): Payer: Medicare HMO

## 2019-03-25 DIAGNOSIS — R079 Chest pain, unspecified: Secondary | ICD-10-CM | POA: Diagnosis not present

## 2019-03-25 DIAGNOSIS — M7989 Other specified soft tissue disorders: Secondary | ICD-10-CM

## 2019-03-25 DIAGNOSIS — R52 Pain, unspecified: Secondary | ICD-10-CM

## 2019-03-25 DIAGNOSIS — F418 Other specified anxiety disorders: Secondary | ICD-10-CM | POA: Diagnosis present

## 2019-03-25 DIAGNOSIS — Z8616 Personal history of COVID-19: Secondary | ICD-10-CM

## 2019-03-25 DIAGNOSIS — Z8619 Personal history of other infectious and parasitic diseases: Secondary | ICD-10-CM | POA: Diagnosis not present

## 2019-03-25 DIAGNOSIS — E118 Type 2 diabetes mellitus with unspecified complications: Secondary | ICD-10-CM | POA: Diagnosis not present

## 2019-03-25 DIAGNOSIS — E1165 Type 2 diabetes mellitus with hyperglycemia: Secondary | ICD-10-CM | POA: Diagnosis not present

## 2019-03-25 DIAGNOSIS — F32A Depression, unspecified: Secondary | ICD-10-CM | POA: Diagnosis present

## 2019-03-25 HISTORY — DX: Personal history of COVID-19: Z86.16

## 2019-03-25 LAB — CBG MONITORING, ED
Glucose-Capillary: 132 mg/dL — ABNORMAL HIGH (ref 70–99)
Glucose-Capillary: 120 mg/dL — ABNORMAL HIGH (ref 70–99)
Glucose-Capillary: 114 mg/dL — ABNORMAL HIGH (ref 70–99)

## 2019-03-25 LAB — CBC
HCT: 44.2 % (ref 36.0–46.0)
Hemoglobin: 14.2 g/dL (ref 12.0–15.0)
MCH: 30.4 pg (ref 26.0–34.0)
MCHC: 32.1 g/dL (ref 30.0–36.0)
MCV: 94.6 fL (ref 80.0–100.0)
Platelets: 175 10*3/uL (ref 150–400)
RBC: 4.67 MIL/uL (ref 3.87–5.11)
RDW: 14 % (ref 11.5–15.5)
WBC: 7.6 10*3/uL (ref 4.0–10.5)
nRBC: 0 % (ref 0.0–0.2)

## 2019-03-25 LAB — TROPONIN I (HIGH SENSITIVITY): Troponin I (High Sensitivity): 10 ng/L (ref <18)

## 2019-03-25 LAB — HEMOGLOBIN A1C
Hgb A1c MFr Bld: 10.9 % — ABNORMAL HIGH (ref 4.8–5.6)
Mean Plasma Glucose: 266.13 mg/dL

## 2019-03-25 LAB — HIV ANTIBODY (ROUTINE TESTING W REFLEX): HIV Screen 4th Generation wRfx: NONREACTIVE

## 2019-03-25 MED ORDER — ACETAMINOPHEN 325 MG PO TABS: 650.0000 mg | ORAL_TABLET | ORAL | Status: DC | PRN

## 2019-03-25 MED ORDER — DULOXETINE HCL 60 MG PO CPEP: 60.0000 mg | ORAL_CAPSULE | ORAL | Status: DC

## 2019-03-25 MED ORDER — DICLOFENAC SODIUM 1 % EX GEL: 2.0000 g | Freq: Four times a day (QID) | CUTANEOUS | Status: DC | PRN

## 2019-03-25 MED ORDER — NITROGLYCERIN 0.4 MG SL SUBL: 0.4000 mg | SUBLINGUAL_TABLET | SUBLINGUAL | Status: DC | PRN

## 2019-03-25 MED ORDER — LEVOTHYROXINE SODIUM 25 MCG PO TABS
125.0000 ug | ORAL_TABLET | Freq: Every day | ORAL | Status: DC
  Administered 2019-03-25: 125 ug via ORAL
  Filled 2019-03-25: qty 1

## 2019-03-25 MED ORDER — INSULIN GLARGINE 100 UNIT/ML ~~LOC~~ SOLN: 95.0000 [IU] | Freq: Every day | SUBCUTANEOUS | Status: DC

## 2019-03-25 MED ORDER — OXYCODONE HCL 5 MG PO TABS
10.0000 mg | ORAL_TABLET | Freq: Two times a day (BID) | ORAL | Status: DC
  Administered 2019-03-25: 10 mg via ORAL
  Filled 2019-03-25: qty 2

## 2019-03-25 MED ORDER — FENTANYL 50 MCG/HR TD PT72
1.0000 | MEDICATED_PATCH | TRANSDERMAL | Status: DC
  Filled 2019-03-25: qty 1

## 2019-03-25 MED ORDER — ATORVASTATIN CALCIUM 10 MG PO TABS: 20.0000 mg | ORAL_TABLET | Freq: Every day | ORAL | Status: DC

## 2019-03-25 MED ORDER — INSULIN ASPART 100 UNIT/ML ~~LOC~~ SOLN
0.0000 [IU] | SUBCUTANEOUS | Status: DC
  Administered 2019-03-25: 3 [IU] via SUBCUTANEOUS

## 2019-03-25 MED ORDER — INSULIN GLARGINE 100 UNIT/ML ~~LOC~~ SOLN
80.0000 [IU] | Freq: Every day | SUBCUTANEOUS | Status: DC
  Filled 2019-03-25: qty 0.8

## 2019-03-25 MED ORDER — ENOXAPARIN SODIUM 40 MG/0.4ML ~~LOC~~ SOLN
40.0000 mg | Freq: Every day | SUBCUTANEOUS | Status: DC
  Filled 2019-03-25: qty 0.4

## 2019-03-25 MED ORDER — LIDOCAINE 5 % EX PTCH
1.0000 | MEDICATED_PATCH | CUTANEOUS | Status: DC
  Filled 2019-03-25: qty 3

## 2019-03-25 MED ORDER — HYDROXYZINE HCL 25 MG PO TABS: 25.0000 mg | ORAL_TABLET | Freq: Three times a day (TID) | ORAL | Status: DC | PRN

## 2019-03-25 MED ORDER — TOPIRAMATE 100 MG PO TABS
200.0000 mg | ORAL_TABLET | Freq: Two times a day (BID) | ORAL | Status: DC
  Filled 2019-03-25 (×2): qty 2

## 2019-03-25 MED ORDER — GABAPENTIN 600 MG PO TABS
1200.0000 mg | ORAL_TABLET | Freq: Three times a day (TID) | ORAL | Status: DC
  Administered 2019-03-25: 1200 mg via ORAL
  Filled 2019-03-25 (×3): qty 2

## 2019-03-25 MED ORDER — MORPHINE SULFATE (PF) 4 MG/ML IV SOLN: 4.0000 mg | INTRAVENOUS | Status: DC | PRN

## 2019-03-25 MED ORDER — PANTOPRAZOLE SODIUM 40 MG PO TBEC
40.0000 mg | DELAYED_RELEASE_TABLET | Freq: Every day | ORAL | Status: DC
  Administered 2019-03-25: 40 mg via ORAL
  Filled 2019-03-25: qty 1

## 2019-03-25 MED ORDER — DULOXETINE HCL 60 MG PO CPEP
60.0000 mg | ORAL_CAPSULE | Freq: Every day | ORAL | Status: DC
  Filled 2019-03-25: qty 1

## 2019-03-25 MED ORDER — DULOXETINE HCL 60 MG PO CPEP
120.0000 mg | ORAL_CAPSULE | Freq: Every day | ORAL | Status: DC
  Administered 2019-03-25: 120 mg via ORAL
  Filled 2019-03-25: qty 2

## 2019-03-25 MED ORDER — ARIPIPRAZOLE 5 MG PO TABS
5.0000 mg | ORAL_TABLET | Freq: Every day | ORAL | Status: DC
  Administered 2019-03-25: 5 mg via ORAL
  Filled 2019-03-25: qty 1

## 2019-03-25 MED ORDER — LEVETIRACETAM 500 MG PO TABS
1500.0000 mg | ORAL_TABLET | Freq: Two times a day (BID) | ORAL | Status: DC
  Administered 2019-03-25: 1500 mg via ORAL
  Filled 2019-03-25: qty 3

## 2019-03-25 NOTE — H&P (Addendum)
History and Physical    [name]:[id number] DOB: 1971-02-15 DOA: 03/24/2019  PCP: Pecolia Ades, NP   Patient coming from: Home   Chief Complaint: Chest pain, fatigue    HPI: [name] is a 48 y.o. female with medical history significant for insulin-dependent diabetes mellitus, chronic kidney disease, depression with anxiety, migraines, BMI 54, chronic pain, and recent COVID-19 infection, now presenting to the emergency department for evaluation of chest pain and fatigue.  Patient reports that she had upper respiratory symptoms approximately 1 month ago, was positive for COVID-19 on 03/03/2019, was subsequently negative for COVID-19 on 03/13/2019, and well the upper respiratory symptoms resolved, she has had progressive chest pain and fatigue.  Patient reports that the chest pain developed insidiously but has been impacting her usual activities for the past several days.  She initially thought that it was related to her indigestion but has not had any relief with treatment of that.  She describes the pain as occurring all across the anterior chest and sometimes radiating towards her back.  She has not been particularly short of breath but becomes fatigued with minimal activity recently.  She reports chronic leg swelling and tenderness.  She believes she may have had a small clot near her kidney previously and was on a blood thinner for that.  She has not had any recent fevers or chills.  She denies orthopnea.  ED Course: Upon arrival to the ED, patient is found to be afebrile, saturating mid 90s on room air, tachycardic in the 110s, and with stable blood pressure.  EKG features sinus tachycardia with rate 110.  Chest x-rays negative for acute cardiopulmonary disease.  CTA chest is negative for acute central PE but evaluation for distal PE limited.  Chemistry panel with creatinine 1.04, similar to priors.  CBC unremarkable.  High-sensitivity troponin and BNP are normal.  Patient  was treated with Dilaudid and fentanyl in the emergency department and hospitalists are asked to admit.  Review of Systems:  All other systems reviewed and apart from HPI, are negative.  Past Medical History:  Diagnosis Date  . Acute renal failure (ARF) ([place]) 09/02/2012  . Anemia   . Anxiety    panic attacks  . Back pain   . Blood transfusion   . Chronic heel ulcer ([place])   . Chronic kidney disease   . Chronic pain syndrome   . Depression   . Diabetes mellitus    type II   . DJD (degenerative joint disease)   . Dysrhythmia    pt unsure what this was  . Fibromyalgia   . GERD (gastroesophageal reflux disease)   . H/O: Bell's palsy 2011  . Heart murmur    "slight one"  . History of kidney stones   . History of MRSA infection OF ULCER  . Hypertension    no longer taken since 10-11 months per patient at preop phone call of 10/13/2017   . Hypothyroidism   . Hypoventilation associated with obesity syndrome ([place])   . Migraine   . Non-healing non-surgical wound    Right hip, has Wound vac to hip.  Started as a skin tear.  . Peripheral vascular disease ([place])   . PONV (postoperative nausea and vomiting)    can be slow to wake up after surgery  . Right foot drop   . Shortness of breath    with Activity  . Sleep apnea    "study shows not bad enough for CPAP.", pt  denies  . Umbilical hernia   . URI (upper respiratory infection) 03/05/2011    Past Surgical History:  Procedure Laterality Date  . BACK SURGERY     for lumbar disc disease X2  . 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 BRAIN SURGERY      Plating due to soft spot closing too early- age 48  . BREAST LUMPECTOMY WITH NEEDLE LOCALIZATION Right 08/23/2013   Procedure: RIGHT BREAST LUMPECTOMY WITH NEEDLE LOCALIZATION;  Surgeon: Odis Hollingshead, MD;  Location: [facility];  Service: General;  Laterality: Right;  . CHOLECYSTECTOMY  1984  . EXCISION UMBILICAL NODULE N/A 2/87/8676   Procedure: EXCISION OF UMBILICUS;   Surgeon: Coralie Keens, MD;  Location: [facility];  Service: General;  Laterality: N/A;  . EYE SURGERY     eye lift  . I & D EXTREMITY  04/02/2011   Procedure: IRRIGATION AND DEBRIDEMENT EXTREMITY;  Surgeon: Mcarthur Rossetti;  Location: [facility];  Service: Orthopedics;  Laterality: Right;  I&D right heel ulcer, placement of A-cell graft  . INCISION AND DRAINAGE ABSCESS N/A 10/28/2017   Procedure: INCISION AND DRAINAGE UMBILICAL HERNIA ABSCESS;  Surgeon: Ralene Ok, MD;  Location: [facility];  Service: General;  Laterality: N/A;  . INCISION AND DRAINAGE OF WOUND  08/29/2011   Procedure: IRRIGATION AND DEBRIDEMENT WOUND;  Surgeon: Theodoro Kos, DO;  Location: [facility];  Service: Plastics;  Laterality: Right;  . INSERTION OF MESH N/A 10/16/2017   Procedure: INSERTION OF MESH;  Surgeon: Coralie Keens, MD;  Location: [facility];  Service: General;  Laterality: N/A;  . LITHOTRIPSY     2007ish  . right elbow    . UMBILICAL HERNIA REPAIR N/A 10/16/2017   Procedure: UMBILICAL HERNIA REPAIR WITH MESH;  Surgeon: Coralie Keens, MD;  Location: [facility];  Service: General;  Laterality: N/A;     reports that she has never smoked. She has never used smokeless tobacco. She reports that she does not drink alcohol or use drugs.  Allergies  Allergen Reactions  . Penicillins Hives and Other (See Comments)    HAS PT DEVELOPED SEVERE RASH INVOLVING MUCUS MEMBRANES or SKIN NECROSIS: #  #  YES  #  # PATIENT HAS HAD A PCN REACTION THAT REQUIRED HOSPITALIZATION:  #  #  YES  #  #   Tolerates amoxicillin on multiple occasions per Dr. Darrel Hoover note 11/01/17.  TDD.  . Clindamycin/Lincomycin Hives, Dermatitis and Rash  . 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 Sulfa Antibiotics Hives  . Versed [Midazolam] Nausea And Vomiting    Pt had medication on October 16 2017 with no issues    Family History  Problem Relation Age of Onset  . Diabetes type II Father   . Heart attack  Father   . Peripheral vascular disease Father   . Diabetes type II Mother   . Anesthesia problems Mother   . Heart attack Mother   . Peripheral vascular disease Mother   . Hypertension Mother      Prior to Admission medications   Medication Sig Start Date End Date Taking? Authorizing Provider  acetaminophen (TYLENOL) 500 MG tablet Take 1,000 mg by mouth 2 (two) times daily as needed for moderate pain or headache.   Yes [provider]  ARIPiprazole (ABILIFY) 5 MG tablet Take 5 mg by mouth daily. 03/04/19  Yes [provider]  atorvastatin (LIPITOR) 20 MG tablet  Take 20 mg by mouth daily.   Yes [provider]  Biotin 10000 MCG TABS Take 10,000 mcg by mouth daily.   Yes [provider]  carisoprodol (SOMA) 350 MG tablet Take 350 mg by mouth 2 (two) times daily.    Yes [provider]  cetirizine (ZYRTEC) 10 MG tablet Take 10 mg by mouth daily as needed for allergies.   Yes [provider]  Cyanocobalamin (VITAMIN B-12 IJ) Inject 1 each as directed every 30 (thirty) days.    Yes [provider]  Diclofenac Sodium 2 % SOLN Place 2-4 g onto the skin 4 (four) times daily as needed. 10/08/18  Yes Mcarthur Rossetti, MD  DULoxetine (CYMBALTA) 60 MG capsule Take 60-120 mg by mouth See admin instructions. 120 mg in the morning, 60 mg in the evening   Yes [provider]  eletriptan (RELPAX) 40 MG tablet Take 40 mg by mouth as needed for migraine or headache. May repeat in 2 hours if headache persists or recurs.   Yes [provider]  esomeprazole (NEXIUM) 40 MG capsule Take 40 mg by mouth daily before breakfast. Name Brand Only   Yes [provider]  furosemide (LASIX) 20 MG tablet Take 20 mg by mouth daily as needed for fluid.    Yes [provider]  gabapentin (NEURONTIN) 600 MG tablet Take 1,200 mg by mouth 3 (three) times daily.   Yes [provider]  hydrOXYzine (VISTARIL) 25 MG  capsule Take 25 mg by mouth 3 (three) times daily as needed for anxiety.   Yes [provider]  insulin aspart (NOVOLOG) 100 UNIT/ML FlexPen Inject 95 Units into the skin 2 (two) times daily.   Yes [provider]  insulin glargine (LANTUS) 100 UNIT/ML injection Inject 0.44 mLs (44 Units total) into the skin at bedtime. Patient taking differently: Inject 95 Units into the skin at bedtime.  11/06/17  Yes Coralie Keens, MD  levETIRAcetam (KEPPRA) 750 MG tablet Take 1,500 mg by mouth 2 (two) times daily.  03/04/19  Yes [provider]  levothyroxine (SYNTHROID) 150 MCG tablet Take 125 mcg by mouth daily.    Yes [provider]  lidocaine (LIDODERM) 5 % Place 1-3 patches onto the skin daily. ONE PATCH ON EACH KNEE AND ONE PATCH ON THE LOWER BACK 05/30/17  Yes [provider]  milk thistle 175 MG tablet Take 350 mg by mouth daily.   Yes [provider]  NARCAN 4 MG/0.1ML LIQD nasal spray kit PLACE 1 SPRAY IN 1 NOSTRIL FOR ACCIDENTAL OVERDOSE. MAY REPEAT Q 2 TO 3 MIN IF PATIENT UNRESPONSIVE. 10/22/17  Yes [provider]  nystatin (MYCOSTATIN/NYSTOP) 100000 UNIT/GM POWD Apply 1 g topically daily as needed (blemishes).    Yes [provider]  Oxycodone HCl 10 MG TABS Take 10 mg by mouth 2 (two) times daily.    Yes [provider]  PE-Triprolidine-DM-GG-APAP ([facility] DAY/NIGHT PO) Take 1 tablet by mouth every 6 (six) hours as needed (cold symptoms).   Yes [provider]  Phenyleph-Doxylamine-DM-APAP (NYQUIL SEVERE COLD/FLU) 5-6.25-10-325 MG/15ML LIQD Take 30 mLs by mouth every 8 (eight) hours as needed (cold symptoms).   Yes [provider]  promethazine (PHENERGAN) 25 MG tablet Take 25 mg by mouth every 6 (six) hours as needed for nausea or vomiting.   Yes [provider]  senna (SENOKOT) 8.6 MG TABS tablet Take 8.6 mg by mouth daily as needed for mild constipation.  Yes [provider]  sitaGLIPtin (JANUVIA) 50 MG tablet Take 50 mg by mouth daily.   Yes [provider]  topiramate (TOPAMAX) 200 MG tablet Take 200 mg by mouth 2 (two) times daily.   Yes [provider]  TRULICITY 1.5 ER/7.4YC SOPN 1.5 mg by Subconjunctival route every Tuesday.  11/24/18  Yes [provider]  fentaNYL (DURAGESIC) 50 MCG/HR Place 50 mcg onto the skin every 3 (three) days.     [provider]    Physical Exam: Vitals:   03/24/19 2100 03/25/19 0000 03/25/19 0045 03/25/19 0115  BP: (!) 147/98 121/81 (!) 143/84 (!) 111/92  Pulse: 87 89    Resp: '16 12 14 15  ' Temp:      TempSrc:      SpO2: 94% 99%    Weight:      Height:        Constitutional: NAD, calm  Eyes: PERTLA, lids and conjunctivae normal ENMT: Mucous membranes are moist. Posterior pharynx clear of any exudate or lesions.   Neck: normal, supple, no masses, no thyromegaly Respiratory: no wheezing, no crackles. Normal respiratory effort. No accessory muscle use.  Cardiovascular: Rate ~110 and regular. JVP difficult to visualize. Abdomen: No distension, no tenderness, soft. Bowel sounds active.  Musculoskeletal: no clubbing / cyanosis. No joint deformity upper and lower extremities.  Skin: no significant rashes, lesions, ulcers. Warm, dry, well-perfused. Neurologic: No facial asymmetry. Sensation intact. Moving all extremities.  Psychiatric: Alert and answering questions appropriately. Pleasant, cooperative.     Labs on Admission: I have personally reviewed following labs and imaging studies  CBC: Recent Labs  Lab 03/24/19 1532  WBC 8.9  HGB 14.8  HCT 45.8  MCV 93.7  PLT 144   Basic Metabolic Panel: Recent Labs  Lab 03/24/19 1532  NA 133*  K 4.0  CL 102  CO2 22  GLUCOSE 226*  BUN 17  CREATININE 1.04*  CALCIUM 8.9   GFR: Estimated Creatinine Clearance: 95.5 mL/min (A) (by C-G formula based on SCr of 1.04 mg/dL (H)). Liver Function Tests: Recent Labs  Lab  03/24/19 1532  AST 49*  ALT 39  ALKPHOS 110  BILITOT 0.5  PROT 6.9  ALBUMIN 3.7   No results for input(s): LIPASE, AMYLASE in the last 168 hours. No results for input(s): AMMONIA in the last 168 hours. Coagulation Profile: No results for input(s): INR, PROTIME in the last 168 hours. Cardiac Enzymes: No results for input(s): CKTOTAL, CKMB, CKMBINDEX, TROPONINI in the last 168 hours. BNP (last 3 results) No results for input(s): PROBNP in the last 8760 hours. HbA1C: No results for input(s): HGBA1C in the last 72 hours. CBG: No results for input(s): GLUCAP in the last 168 hours. Lipid Profile: No results for input(s): CHOL, HDL, LDLCALC, TRIG, CHOLHDL, LDLDIRECT in the last 72 hours. Thyroid Function Tests: No results for input(s): TSH, T4TOTAL, FREET4, T3FREE, THYROIDAB in the last 72 hours. Anemia Panel: No results for input(s): VITAMINB12, FOLATE, FERRITIN, TIBC, IRON, RETICCTPCT in the last 72 hours. Urine analysis:    Component Value Date/Time   COLORURINE YELLOW 09/05/2012 1141   APPEARANCEUR CLEAR 09/05/2012 1141   LABSPEC 1.016 09/05/2012 1141   PHURINE 5.5 09/05/2012 1141   GLUCOSEU NEGATIVE 09/05/2012 1141   HGBUR NEGATIVE 09/05/2012 1141   BILIRUBINUR NEGATIVE 09/05/2012 1141   KETONESUR NEGATIVE 09/05/2012 1141   PROTEINUR NEGATIVE 09/05/2012 1141   UROBILINOGEN 0.2 09/05/2012 1141   NITRITE NEGATIVE 09/05/2012 1141   LEUKOCYTESUR NEGATIVE 09/05/2012 1141  Sepsis Labs: '@LABRCNTIP' (procalcitonin:4,lacticidven:4) )No results found for this or any previous visit (from the past 240 hour(s)).   Radiological Exams on Admission: DG Chest 2 View  Result Date: 03/24/2019 CLINICAL DATA:  Shortness of breath, hypoxia EXAM: CHEST - 2 VIEW COMPARISON:  08/19/2013 FINDINGS: The heart size and mediastinal contours are within normal limits. Minimal linear atelectasis within the bilateral lung bases. No focal airspace consolidation, pleural effusion, or pneumothorax. The  visualized skeletal structures are unremarkable. IMPRESSION: No active cardiopulmonary disease. Electronically Signed   By: Davina Poke D.O.   On: 03/24/2019 15:58   CT Angio Chest PE W and/or Wo Contrast  Result Date: 03/24/2019 CLINICAL DATA:  Shortness of breath history of COVID EXAM: CT ANGIOGRAPHY CHEST WITH CONTRAST TECHNIQUE: Multidetector CT imaging of the chest was performed using the standard protocol during bolus administration of intravenous contrast. Multiplanar CT image reconstructions and MIPs were obtained to evaluate the vascular anatomy. CONTRAST:  76m OMNIPAQUE IOHEXOL 350 MG/ML SOLN COMPARISON:  Chest x-ray 03/24/2019, CT chest 12/30/2003 FINDINGS: Cardiovascular: Satisfactory opacification of the pulmonary arteries to the segmental level. Limited evaluation for distal PE, mostly at the right lower lobe secondary to respiratory motion and poor vessel opacifications. No acute central embolus is seen. Aorta is nonaneurysmal. The heart size is within normal limits. No pericardial effusion Mediastinum/Nodes: Midline trachea. No thyroid mass. Borderline right paratracheal nodes up to 1 cm. Esophagus normal Lungs/Pleura: No pleural effusion or pneumothorax. Hazy and linear densities in the right upper and lower lobes favored to represent subsegmental atelectasis. Upper Abdomen: No acute abnormality. Musculoskeletal: No acute or suspicious osseous abnormality. Review of the MIP images confirms the above findings. IMPRESSION: 1. Limited evaluation for distal PE in the lower lobes due to motion and poor vessel opacification. No acute central embolus is seen. 2. Hazy and linear densities in the right upper and bilateral lower lobes favored to represent atelectasis. No focal pneumonia is seen. Electronically Signed   By: KDonavan FoilM.D.   On: 03/24/2019 23:13    EKG: Independently reviewed. Sinus tachycardia (rate 110).   Assessment/Plan  1. Chest pain  - Patient with recent history of  COVID-19 infection now presents with progressive chest pain, not improved with PPI at home, found to be tachycardic and dyspneic in outpatient clinic and sent to ED with concern for possible PE  - EKG with sinus tachycardia, HS troponin normal, and CTA chest negative for central PE  - ACS less likely with negative HS troponin and worsening with certain movements, central PE excluded with CTA in ED but distal PE possible, ?sequela of COVID-19 or MSK strain from recent coughing  - Continue cardiac monitoring, check one more HS troponin as priors drawn <2h apart, check LE venous dopplers    2. Insulin-dependent DM  - A1c was 8.7% in [address] - Hold Trulicity and Januvia for now and continue Lantus and Novolog -   3. Hypothyroidism  - Continue Synthroid    4. Chronic pain  - Mainly in low back and knees, followed by ortho  - Continue home regimen with fentanyl patch, oxycodone, Cymbalta, and topical Voltaren and lidocaine    5. Depression, anxiety  - Continue Abilify, Cymbalta, prn Vistaril    DVT prophylaxis: Lovenox  Code Status: Full  Family Communication: Discussed with patient  Consults called: None  Admission status: Observation     TVianne Bulls MD [facility] Pager [phone] If 7PM-7AM, please contact night-coverage [email or web address] Password [email or web address]  03/25/2019, 1:45 AM

## 2019-03-25 NOTE — ED Notes (Signed)
Lunch Tray Ordered @ 1108.  

## 2019-03-25 NOTE — ED Notes (Signed)
Ordered diet tray for pt  

## 2019-03-25 NOTE — Progress Notes (Signed)
Lower extremity venous has been completed.   Preliminary results in CV Proc.   [name] 03/25/2019 10:03 AM

## 2019-03-25 NOTE — Discharge Instructions (Signed)

## 2019-03-25 NOTE — Discharge Summary (Signed)
Physician Discharge Summary  [name] KPV:[id number] DOB: 05-17-1970 DOA: 03/24/2019  PCP: Pecolia Ades, NP  Admit date: 03/24/2019 Discharge date: 03/25/2019  Time spent: 45 minutes  Recommendations for Outpatient Follow-up:  1. Follow up with PCP 1-2 weeks for evaluation of symptoms.   Discharge Diagnoses:  Principal Problem:   Chest pain Active Problems:   Diabetes mellitus type 2 with complications, uncontrolled (HCC)   Hypothyroidism   Chronic pain of both knees   History of 2019 novel coronavirus disease (COVID-19)   Depression with anxiety   Discharge Condition: stable  Diet recommendation: carb modified heart healthy  Filed Weights   03/24/19 1526  Weight: (!) 148.3 kg    History of present illness:  [name] is a 48 y.o. female with medical history significant for insulin-dependent diabetes mellitus, chronic kidney disease, depression with anxiety, migraines, BMI 54, chronic pain, and recent COVID-19 infection, presented 12/30 to the emergency department for evaluation of chest pain and fatigue.  Patient reported that she had upper respiratory symptoms approximately 1 month ago, was positive for COVID-19 on 03/03/2019, was subsequently negative for COVID-19 on 03/13/2019. she has had progressive chest pain and fatigue.  Patient reported that the chest pain developed insidiously but had been impacting her usual activities for the  several days.  She initially thought that it was related to her indigestion but no relief with treatment of that.  She described the pain as occurring all across the anterior chest and sometimes radiating towards her back.  She had not been particularly short of breath but became fatigued with minimal activity recently.  She reported chronic leg swelling and tenderness.  She believed she may have had a small clot near her kidney previously and was on a blood thinner for that.  She had not had any recent fevers or chills.  She  denied orthopnea  Hospital Course:   1. Chest pain  - Atypical.likely musculoskeletal from coughing as pain reproducible and worse with movement.  Patient with recent history of COVID-19 infection  presented with progressive chest pain, not improved with PPI at home, found to be tachycardic and dyspneic in outpatient clinic and sent to ED with concern for possible PE  EKG with sinus tachycardia, HS troponin normal, and CTA chest negative for central PE  ACS less likely with negative HS troponin,  and worsening with certain movements, central PE excluded with CTA in ED. Doppler negative for dvt. Recommend follow up with PCP 1-2 weeks for evaluation of symptoms  2. Insulin-dependent DM  - A1c was 8.7% in 2019. Home meds include Trulicity and Januvia Lantus and Novolog  3. Hypothyroidism  - Continue Synthroid    4. Chronic pain  Mainly in low back and knees, followed by ortho. Continue home regimen with fentanyl patch, oxycodone, Cymbalta, and topical Voltaren and lidocaine    5. Depression, anxiety stable at baseline. Continue Abilify, Cymbalta, prn Vistaril   Procedures:  LE dopplar  Consultations:    Discharge Exam: Vitals:   03/25/19 0700 03/25/19 0800  BP: 111/74 114/77  Pulse: 83 83  Resp: 10 11  Temp:    SpO2: 94% 94%    General: awake alert obese no acute distress Cardiovascular: rrr no mgr trace LE edema chest tender to palpation anteriorly Respiratory: normal effort BS clear bilaterally no crackles or wheeze  Discharge Instructions   Discharge Instructions    Call MD for:  persistant dizziness or light-headedness   Complete by: As directed  Call MD for:  severe uncontrolled pain   Complete by: As directed    Call MD for:  temperature >100.4   Complete by: As directed    Diet - low sodium heart healthy   Complete by: As directed    Discharge instructions   Complete by: As directed    Follow up with PCP 1-2 weeks for evaluation of symptoms Recommend  weight loss program   Increase activity slowly   Complete by: As directed      Allergies as of 03/25/2019      Reactions   Penicillins Hives, Other (See Comments)   HAS PT DEVELOPED SEVERE RASH INVOLVING MUCUS MEMBRANES or SKIN NECROSIS: #  #  YES  #  # PATIENT HAS HAD A PCN REACTION THAT REQUIRED HOSPITALIZATION:  #  #  YES  #  #  Tolerates amoxicillin on multiple occasions per Dr. Darrel Hoover note 11/01/17.  TDD.   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   ARIPiprazole 5 MG tablet Commonly known as: ABILIFY Take 5 mg by mouth daily.   atorvastatin 20 MG tablet Commonly known as: LIPITOR Take 20 mg by mouth daily.   Biotin 10000 MCG Tabs Take 10,000 mcg by mouth daily.   carisoprodol 350 MG tablet Commonly known as: SOMA Take 350 mg by mouth 2 (two) times daily.   cetirizine 10 MG tablet Commonly known as: ZYRTEC Take 10 mg by mouth daily as needed for allergies.   Diclofenac Sodium 2 % Soln Place 2-4 g onto the skin 4 (four) times daily as needed.   DULoxetine 60 MG capsule Commonly known as: CYMBALTA Take 60-120 mg by mouth See admin instructions. 120 mg in the morning, 60 mg in the evening   eletriptan 40 MG tablet Commonly known as: RELPAX Take 40 mg by mouth as needed for migraine or headache. May repeat in 2 hours if headache persists or recurs.   esomeprazole 40 MG capsule Commonly known as: NEXIUM Take 40 mg by mouth daily before breakfast. Name Brand Only   fentaNYL 50 MCG/HR Commonly known as: DURAGESIC Place 50 mcg onto the skin every 3 (three) days.    furosemide 20 MG tablet Commonly known as: LASIX Take 20 mg by mouth daily as needed for fluid.   gabapentin 600 MG tablet Commonly known as: NEURONTIN Take 1,200 mg by mouth 3 (three) times daily.   hydrOXYzine 25 MG capsule Commonly known as: VISTARIL Take 25 mg by mouth 3 (three) times daily as needed for anxiety.   insulin aspart 100 UNIT/ML FlexPen Commonly known as: NOVOLOG Inject 95 Units into the skin 2 (two) times daily.   insulin glargine 100 UNIT/ML injection Commonly known as: LANTUS Inject 0.95 mLs (95 Units total) into the skin at bedtime.   levETIRAcetam 750 MG tablet Commonly known as: KEPPRA Take 1,500 mg by mouth 2 (two) times daily.   levothyroxine 150 MCG tablet Commonly known as: SYNTHROID Take 125 mcg by mouth daily.   lidocaine 5 % Commonly known as: LIDODERM Place 1-3 patches onto  the skin daily. ONE PATCH ON EACH KNEE AND ONE PATCH ON THE LOWER BACK   milk thistle 175 MG tablet Take 350 mg by mouth daily.   MUCINEX CLEAR & COOL DAY/NIGHT PO Take 1 tablet by mouth every 6 (six) hours as needed (cold symptoms).   Narcan 4 MG/0.1ML Liqd nasal spray kit Generic drug: naloxone PLACE 1 SPRAY IN 1 NOSTRIL FOR ACCIDENTAL OVERDOSE. MAY REPEAT Q 2 TO 3 MIN IF PATIENT UNRESPONSIVE.   NyQuil Severe Cold/Flu 5-6.25-10-325 MG/15ML Liqd Generic drug: Phenyleph-Doxylamine-DM-APAP Take 30 mLs by mouth every 8 (eight) hours as needed (cold symptoms).   nystatin powder Generic drug: nystatin Apply 1 g topically daily as needed (blemishes).   Oxycodone HCl 10 MG Tabs Take 10 mg by mouth 2 (two) times daily.   promethazine 25 MG tablet Commonly known as: PHENERGAN Take 25 mg by mouth every 6 (six) hours as needed for nausea or vomiting.   senna 8.6 MG Tabs tablet Commonly known as: SENOKOT Take 8.6 mg by mouth daily as needed for mild constipation.   sitaGLIPtin 50 MG tablet Commonly known as: JANUVIA Take 50 mg by mouth daily.   topiramate 200  MG tablet Commonly known as: TOPAMAX Take 200 mg by mouth 2 (two) times daily.   Trulicity 1.5 DH/7.4BU Sopn Generic drug: Dulaglutide 1.5 mg by Subconjunctival route every Tuesday.   VITAMIN B-12 IJ Inject 1 each as directed every 30 (thirty) days.      Allergies  Allergen Reactions  . Penicillins Hives and Other (See Comments)    HAS PT DEVELOPED SEVERE RASH INVOLVING MUCUS MEMBRANES or SKIN NECROSIS: #  #  YES  #  # PATIENT HAS HAD A PCN REACTION THAT REQUIRED HOSPITALIZATION:  #  #  YES  #  #   Tolerates amoxicillin on multiple occasions per Dr. Darrel Hoover note 11/01/17.  TDD.  . Clindamycin/Lincomycin Hives, Dermatitis and Rash  . 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 Sulfa Antibiotics Hives  . Versed [Midazolam] Nausea And Vomiting    Pt had medication on October 16 2017 with no issues      The results of significant diagnostics from this hospitalization (including imaging, microbiology, ancillary and laboratory) are listed below for reference.    Significant Diagnostic Studies: DG Chest 2 View  Result Date: 03/24/2019 CLINICAL DATA:  Shortness of breath, hypoxia EXAM: CHEST - 2 VIEW COMPARISON:  08/19/2013 FINDINGS: The heart size and mediastinal contours are within normal limits. Minimal linear atelectasis within the bilateral lung bases. No focal airspace consolidation, pleural effusion, or pneumothorax. The visualized skeletal structures are unremarkable. IMPRESSION: No active cardiopulmonary disease. Electronically Signed   By: Davina Poke D.O.   On: 03/24/2019 15:58   CT Angio Chest PE W and/or Wo Contrast  Result Date: 03/24/2019 CLINICAL DATA:  Shortness of breath history of COVID EXAM: CT ANGIOGRAPHY CHEST WITH CONTRAST TECHNIQUE: Multidetector CT imaging of the chest was performed using the standard protocol during bolus administration of intravenous contrast. Multiplanar CT image reconstructions and MIPs were  obtained to evaluate the vascular anatomy. CONTRAST:  42m OMNIPAQUE IOHEXOL 350 MG/ML SOLN COMPARISON:  Chest x-ray 03/24/2019, CT chest 12/30/2003 FINDINGS: Cardiovascular: Satisfactory opacification of the pulmonary arteries to the segmental level. Limited evaluation for distal PE, mostly at the right lower lobe secondary to respiratory motion and poor vessel opacifications. No acute central embolus is seen. Aorta is nonaneurysmal. The heart size is within normal  limits. No pericardial effusion Mediastinum/Nodes: Midline trachea. No thyroid mass. Borderline right paratracheal nodes up to 1 cm. Esophagus normal Lungs/Pleura: No pleural effusion or pneumothorax. Hazy and linear densities in the right upper and lower lobes favored to represent subsegmental atelectasis. Upper Abdomen: No acute abnormality. Musculoskeletal: No acute or suspicious osseous abnormality. Review of the MIP images confirms the above findings. IMPRESSION: 1. Limited evaluation for distal PE in the lower lobes due to motion and poor vessel opacification. No acute central embolus is seen. 2. Hazy and linear densities in the right upper and bilateral lower lobes favored to represent atelectasis. No focal pneumonia is seen. Electronically Signed   By: Donavan Foil M.D.   On: 03/24/2019 23:13   VAS [name] LOWER EXTREMITY VENOUS (DVT)  Result Date: 03/25/2019  Lower Venous Study Indications: Concern for PE, Pain, and Swelling.  Limitations: Body habitus and poor ultrasound/tissue interface. Comparison Study: no prior Performing Technologist: [name] RVS  Examination Guidelines: A complete evaluation includes B-mode imaging, spectral Doppler, color Doppler, and power Doppler as needed of all accessible portions of each vessel. Bilateral testing is considered an integral part of a complete examination. Limited examinations for reoccurring indications may be performed as noted.   +---------+---------------+---------+-----------+----------+-------------------+ RIGHT    CompressibilityPhasicitySpontaneityPropertiesThrombus Aging      +---------+---------------+---------+-----------+----------+-------------------+ CFV      Full           Yes      Yes                                      +---------+---------------+---------+-----------+----------+-------------------+ SFJ      Full                                                             +---------+---------------+---------+-----------+----------+-------------------+ FV Prox  Full                                                             +---------+---------------+---------+-----------+----------+-------------------+ FV Mid   Full                                                             +---------+---------------+---------+-----------+----------+-------------------+ FV Distal               Yes      Yes                  unable to visualize                                                       compression well    +---------+---------------+---------+-----------+----------+-------------------+ PFV      Full                                                             +---------+---------------+---------+-----------+----------+-------------------+  POP      Full           Yes      Yes                                      +---------+---------------+---------+-----------+----------+-------------------+ PTV                                                   Not visualized      +---------+---------------+---------+-----------+----------+-------------------+ PERO                                                  Not visualized      +---------+---------------+---------+-----------+----------+-------------------+   +---------+---------------+---------+-----------+----------+-------------------+ LEFT     CompressibilityPhasicitySpontaneityPropertiesThrombus Aging       +---------+---------------+---------+-----------+----------+-------------------+ CFV      Full           Yes      Yes                                      +---------+---------------+---------+-----------+----------+-------------------+ SFJ      Full                                                             +---------+---------------+---------+-----------+----------+-------------------+ FV Prox  Full                                                             +---------+---------------+---------+-----------+----------+-------------------+ FV Mid                  Yes      Yes                  unable to visualize                                                       compression well    +---------+---------------+---------+-----------+----------+-------------------+ FV Distal               Yes      Yes                  unable to visualize                                                       compression well    +---------+---------------+---------+-----------+----------+-------------------+  PFV      Full                                                             +---------+---------------+---------+-----------+----------+-------------------+ POP      Full           Yes      Yes                                      +---------+---------------+---------+-----------+----------+-------------------+ PTV      Full                                                             +---------+---------------+---------+-----------+----------+-------------------+ PERO                                                  Not visualized      +---------+---------------+---------+-----------+----------+-------------------+   Summary: Right: There is no evidence of deep vein thrombosis in the lower extremity. However, portions of this examination were limited- see technologist comments above. No cystic structure found in the popliteal fossa. Left: There is no evidence  of deep vein thrombosis in the lower extremity. However, portions of this examination were limited- see technologist comments above. No cystic structure found in the popliteal fossa.  *See table(s) above for measurements and observations.    Preliminary     Microbiology: No results found for this or any previous visit (from the past 240 hour(s)).   Labs: Basic Metabolic Panel: Recent Labs  Lab 03/24/19 1532  NA 133*  K 4.0  CL 102  CO2 22  GLUCOSE 226*  BUN 17  CREATININE 1.04*  CALCIUM 8.9   Liver Function Tests: Recent Labs  Lab 03/24/19 1532  AST 49*  ALT 39  ALKPHOS 110  BILITOT 0.5  PROT 6.9  ALBUMIN 3.7   No results for input(s): LIPASE, AMYLASE in the last 168 hours. No results for input(s): AMMONIA in the last 168 hours. CBC: Recent Labs  Lab 03/24/19 1532 03/25/19 0238  WBC 8.9 7.6  HGB 14.8 14.2  HCT 45.8 44.2  MCV 93.7 94.6  PLT 191 175   Cardiac Enzymes: No results for input(s): CKTOTAL, CKMB, CKMBINDEX, TROPONINI in the last 168 hours. BNP: BNP (last 3 results) Recent Labs    03/24/19 2012  BNP 23.0    ProBNP (last 3 results) No results for input(s): PROBNP in the last 8760 hours.  CBG: Recent Labs  Lab 03/25/19 0229 03/25/19 0901 03/25/19 1216  GLUCAP 132* 120* 114*       Signed:  Radene Gunning NP [facility] 03/25/2019, 1:00 PM

## 2019-03-25 NOTE — ED Notes (Signed)
SisterAnnorah Brenchley, [phone], would like updates on pt.

## 2019-04-07 ENCOUNTER — Ambulatory Visit: Payer: Medicare HMO | Admitting: Cardiology

## 2019-05-03 ENCOUNTER — Ambulatory Visit: Payer: Medicare HMO | Admitting: Registered"

## 2019-05-06 ENCOUNTER — Ambulatory Visit: Payer: Medicare HMO | Admitting: Orthopaedic Surgery

## 2019-05-10 ENCOUNTER — Encounter: Payer: Self-pay | Admitting: Orthopaedic Surgery

## 2019-05-10 ENCOUNTER — Other Ambulatory Visit: Payer: Self-pay

## 2019-05-10 ENCOUNTER — Ambulatory Visit: Payer: Medicare HMO | Admitting: Orthopaedic Surgery

## 2019-05-10 DIAGNOSIS — M1712 Unilateral primary osteoarthritis, left knee: Secondary | ICD-10-CM

## 2019-05-10 DIAGNOSIS — M1710 Unilateral primary osteoarthritis, unspecified knee: Secondary | ICD-10-CM

## 2019-05-10 DIAGNOSIS — M1711 Unilateral primary osteoarthritis, right knee: Secondary | ICD-10-CM

## 2019-05-10 MED ORDER — LIDOCAINE HCL 1 % IJ SOLN
0.5000 mL | INTRAMUSCULAR | Status: AC | PRN
  Administered 2019-05-10: .5 mL

## 2019-05-10 MED ORDER — METHYLPREDNISOLONE ACETATE 40 MG/ML IJ SUSP
40.0000 mg | INTRAMUSCULAR | Status: AC | PRN
  Administered 2019-05-10: 40 mg via INTRA_ARTICULAR

## 2019-05-10 NOTE — Progress Notes (Signed)
   Procedure Note  Patient: [name]             Date of Birth: 1970/12/28           MRN: [id number]             Visit Date: 05/10/2019  HPI: Ayzlee comes in today requesting injections both knees.  She states her right knee is bothering her worse than the left.  She notes the wounds on her abdomen she had after being in [place] improved greatly on antibiotics and it healed up.  She also reports her diabetes is under good control.  Physical exam: Height 5 foot 6 weight 320 pounds BMI 51.6 Bilateral knees no abnormal warmth or erythema.  Overall good range of motion both knees with painful range of motion.  Procedures: Visit Diagnoses:  1. Unilateral primary osteoarthritis, right knee   2. Unilateral primary osteoarthritis, left knee     Large Joint Inj: bilateral knee on 05/10/2019 4:39 PM Indications: pain Details: 22 G 1.5 in needle, anterolateral approach  Arthrogram: No  Medications (Right): 0.5 mL lidocaine 1 %; 40 mg methylPREDNISolone acetate 40 MG/ML Medications (Left): 0.5 mL lidocaine 1 %; 40 mg methylPREDNISolone acetate 40 MG/ML Outcome: tolerated well, no immediate complications Procedure, treatment alternatives, risks and benefits explained, specific risks discussed. Consent was given by the patient. Immediately prior to procedure a time out was called to verify the correct patient, procedure, equipment, support staff and site/side marked as required. Patient was prepped and draped in the usual sterile fashion.    Plan: She asked about knee friendly exercises I recommended recumbent bike.  She will continue work on weight loss and strengthening both knees.  Follow-up with [name] as needed.  Knows to wait least 3 months between injections in the knees.

## 2019-05-20 ENCOUNTER — Ambulatory Visit: Payer: Medicare HMO | Admitting: Orthopaedic Surgery

## 2019-05-29 ENCOUNTER — Ambulatory Visit: Payer: Medicare HMO | Attending: Internal Medicine

## 2019-05-29 DIAGNOSIS — Z23 Encounter for immunization: Secondary | ICD-10-CM

## 2019-05-29 NOTE — Progress Notes (Signed)
   Covid-19 Vaccination Clinic  Name:  [name]    MRN: [id number] DOB: 1970/07/02  05/29/2019  [name] was observed post Covid-19 immunization for 15 minutes without incident. She was provided with Vaccine Information Sheet and instruction to access the V-Safe system.   [name] was instructed to call 911 with any severe reactions post vaccine: Marland Kitchen Difficulty breathing  . Swelling of face and throat  . A fast heartbeat  . A bad rash all over body  . Dizziness and weakness   Immunizations Administered    Name Date Dose VIS Date Route   Pfizer COVID-19 Vaccine 05/29/2019  4:36 PM 0.3 mL 03/05/2019 Intramuscular   Manufacturer: [place]   Lot: [id number]   [place]: [id number]

## 2019-06-03 ENCOUNTER — Encounter: Payer: Self-pay | Admitting: Cardiology

## 2019-06-19 ENCOUNTER — Ambulatory Visit: Payer: Medicare HMO | Attending: Internal Medicine

## 2019-06-19 DIAGNOSIS — Z23 Encounter for immunization: Secondary | ICD-10-CM

## 2019-06-19 NOTE — Progress Notes (Signed)
   Covid-19 Vaccination Clinic  Name:  [name]    MRN: [id number] DOB: 31-Jul-1970  06/19/2019  [name] was observed post Covid-19 immunization for 15 minutes without incident. She was provided with Vaccine Information Sheet and instruction to access the V-Safe system.   [name] was instructed to call 911 with any severe reactions post vaccine: Marland Kitchen Difficulty breathing  . Swelling of face and throat  . A fast heartbeat  . A bad rash all over body  . Dizziness and weakness   Immunizations Administered    Name Date Dose VIS Date Route   Pfizer COVID-19 Vaccine 06/19/2019  4:00 PM 0.3 mL 03/05/2019 Intramuscular   Manufacturer: [place]   Lot: [id number]   [place]: [id number]

## 2019-06-21 ENCOUNTER — Other Ambulatory Visit: Payer: Self-pay | Admitting: Family Medicine

## 2019-06-21 DIAGNOSIS — Z1231 Encounter for screening mammogram for malignant neoplasm of breast: Secondary | ICD-10-CM

## 2019-08-11 ENCOUNTER — Ambulatory Visit: Payer: Medicare HMO | Admitting: Orthopaedic Surgery

## 2019-08-18 ENCOUNTER — Ambulatory Visit (INDEPENDENT_AMBULATORY_CARE_PROVIDER_SITE_OTHER): Payer: Medicare HMO

## 2019-08-18 ENCOUNTER — Other Ambulatory Visit: Payer: Self-pay

## 2019-08-18 ENCOUNTER — Encounter: Payer: Self-pay | Admitting: Orthopaedic Surgery

## 2019-08-18 ENCOUNTER — Ambulatory Visit: Payer: Medicare HMO | Admitting: Orthopaedic Surgery

## 2019-08-18 DIAGNOSIS — M1711 Unilateral primary osteoarthritis, right knee: Secondary | ICD-10-CM

## 2019-08-18 DIAGNOSIS — M170 Bilateral primary osteoarthritis of knee: Secondary | ICD-10-CM

## 2019-08-18 DIAGNOSIS — M1712 Unilateral primary osteoarthritis, left knee: Secondary | ICD-10-CM

## 2019-08-18 MED ORDER — METHYLPREDNISOLONE ACETATE 40 MG/ML IJ SUSP
40.0000 mg | INTRAMUSCULAR | Status: AC | PRN
  Administered 2019-08-18: 40 mg via INTRA_ARTICULAR

## 2019-08-18 MED ORDER — LIDOCAINE HCL 1 % IJ SOLN
3.0000 mL | INTRAMUSCULAR | Status: AC | PRN
  Administered 2019-08-18: 3 mL

## 2019-08-18 MED ORDER — METHYLPREDNISOLONE ACETATE 40 MG/ML IJ SUSP
40.0000 mg | INTRAMUSCULAR | Status: AC | PRN
Start: 2019-08-18 — End: 2019-08-18
  Administered 2019-08-18: 40 mg via INTRA_ARTICULAR

## 2019-08-18 NOTE — Progress Notes (Signed)
Office Visit Note   Patient: [name]           Date of Birth: [birth year]           MRN: [id number] Visit Date: 08/18/2019              Requested by: Pecolia Ades, NP No address on file PCP: Pecolia Ades, NP   Assessment & Plan: Visit Diagnoses:  1. Unilateral primary osteoarthritis, right knee     Plan: I counseled her quite significantly about steroid injections and how this is definitely affecting her blood glucose.  She states that she will continue to work on her blood glucose control and would like to have injections today so I did place steroid injections in both knees.  We can always repeat this in 3 months.  All questions and concerns were answered and addressed.  She is still not a candidate for knee replacement surgery.  At her next visit I would like her weight and her BMI calculated.  Follow-Up Instructions: Return in about 3 months (around 11/18/2019).   Orders:  Orders Placed This Encounter  Procedures  . Large Joint Inj  . Large Joint Inj  . XR Knee 1-2 Views Right   No orders of the defined types were placed in this encounter.     Procedures: Large Joint Inj: R knee on 08/18/2019 4:06 PM Indications: diagnostic evaluation and pain Details: 22 G 1.5 in needle, superolateral approach  Arthrogram: No  Medications: 3 mL lidocaine 1 %; 40 mg methylPREDNISolone acetate 40 MG/ML Outcome: tolerated well, no immediate complications Procedure, treatment alternatives, risks and benefits explained, specific risks discussed. Consent was given by the patient. Immediately prior to procedure a time out was called to verify the correct patient, procedure, equipment, support staff and site/side marked as required. Patient was prepped and draped in the usual sterile fashion.   Large Joint Inj: L knee on 08/18/2019 4:06 PM Indications: diagnostic evaluation and pain Details: 22 G 1.5 in needle, superolateral approach  Arthrogram: No  Medications: 3  mL lidocaine 1 %; 40 mg methylPREDNISolone acetate 40 MG/ML Outcome: tolerated well, no immediate complications Procedure, treatment alternatives, risks and benefits explained, specific risks discussed. Consent was given by the patient. Immediately prior to procedure a time out was called to verify the correct patient, procedure, equipment, support staff and site/side marked as required. Patient was prepped and draped in the usual sterile fashion.       Clinical Data: No additional findings.   Subjective: Chief Complaint  Patient presents with  . Left Knee - Follow-up  . Right Knee - Follow-up  The patient is well-known to me.  She is morbidly obese 49 year old female with severe arthritis in both her knees.  She has lost a considerable arm weight over the years.  She has gained some of this back but still stays much lower than what she was when we first saw her.  She has well-documented end-stage arthritis of both her knees.  She is requesting steroid injections today.  She always waits at least 3 months between injections.  She reports recently that she had a hemoglobin A1c with her primary care physician of only 7.1.  However just 4 months ago her hemoglobin A1c was 10.9.  She states that her blood sugar in the morning is usually about 130.  He has fallen recently injuring her right knee.  Both knees hurt severely.  HPI  Review of Systems She currently  denies any headache, chest pain, short of breath, fever, chills, nausea, vomiting  Objective: Vital Signs: There were no vitals taken for this visit.  Physical Exam She is alert and orient x3 and in no acute distress Ortho Exam Examination of both knees show severe patellofemoral crepitation.  There is surprisingly good range of motion of both knees but they are very painful and grind quite a bit with medial lateral joint line tenderness. Specialty Comments:  No specialty comments available.  Imaging: XR Knee 1-2 Views  Right  Result Date: 08/18/2019 2 views of the right knee show severe end-stage arthritis.    PMFS History: Patient Active Problem List   Diagnosis Date Noted  . History of 2019 novel coronavirus disease (COVID-19) 03/25/2019  . Depression with anxiety 03/25/2019  . Chest pain 03/24/2019  . Postoperative abscess 10/27/2017  . Umbilical hernia, incarcerated, s/p repair 10/16/2017 10/16/2017  . Chronic pain of both knees 04/23/2017  . Unilateral primary osteoarthritis, left knee 10/24/2016  . Unilateral primary osteoarthritis, right knee 10/24/2016  . Abnormal mammogram with microcalcification-Right inner lower quadrant 07/21/2013  . Oliguria and anuria 09/02/2012  . Constipation 03/02/2011  . Lethargy 03/01/2011  . Heel ulcer due to DM ([place]) 02/19/2011  . Wound, open, hip or thigh 02/19/2011  . Healing pressure ulcer stage III ([place]) 02/18/2011  . Morbidly obese ([place]) 02/18/2011  . Diabetes mellitus type 2 with complications, uncontrolled ([place]) 02/18/2011  . Sleep apnea 02/18/2011  . Hypothyroidism 02/18/2011  . Chronic pain 02/18/2011  . Acute lymphangitis 02/18/2011  . Cellulitis of foot 02/18/2011   Past Medical History:  Diagnosis Date  . Acute renal failure (ARF) ([place]) 09/02/2012  . Anemia   . Anxiety    panic attacks  . Back pain   . Blood transfusion   . Chronic heel ulcer ([place])   . Chronic kidney disease   . Chronic pain syndrome   . Depression   . Diabetes mellitus    type II   . DJD (degenerative joint disease)   . Dysrhythmia    pt unsure what this was  . Fibromyalgia   . GERD (gastroesophageal reflux disease)   . H/O: Bell's palsy 2011  . Heart murmur    "slight one"  . History of kidney stones   . History of MRSA infection OF ULCER  . Hypertension    no longer taken since 10-11 months per patient at preop phone call of 10/13/2017   . Hypothyroidism   . Hypoventilation associated with obesity syndrome ([place])   . Migraine   . Non-healing non-surgical  wound    Right hip, has Wound vac to hip.  Started as a skin tear.  . Peripheral vascular disease ([place])   . PONV (postoperative nausea and vomiting)    can be slow to wake up after surgery  . Right foot drop   . Shortness of breath    with Activity  . Sleep apnea    "study shows not bad enough for CPAP.", pt denies  . Umbilical hernia   . URI (upper respiratory infection) 03/05/2011    Family History  Problem Relation Age of Onset  . Diabetes type II Father   . Heart attack Father   . Peripheral vascular disease Father   . Diabetes type II Mother   . Anesthesia problems Mother   . Heart attack Mother   . Peripheral vascular disease Mother   . Hypertension Mother     Past Surgical History:  Procedure Laterality Date  .  BACK SURGERY     for lumbar disc disease X2  . 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 BRAIN SURGERY      Plating due to soft spot closing too early- age 13  . BREAST LUMPECTOMY WITH NEEDLE LOCALIZATION Right 08/23/2013   Procedure: RIGHT BREAST LUMPECTOMY WITH NEEDLE LOCALIZATION;  Surgeon: Odis Hollingshead, MD;  Location: [facility];  Service: General;  Laterality: Right;  . CHOLECYSTECTOMY  1984  . EXCISION UMBILICAL NODULE N/A [id number]   Procedure: EXCISION OF UMBILICUS;  Surgeon: Coralie Keens, MD;  [facility];  Service: General;  Laterality: N/A;  . EYE SURGERY     eye lift  . I & D EXTREMITY  04/02/2011   Procedure: IRRIGATION AND DEBRIDEMENT EXTREMITY;  Surgeon: Mcarthur Rossetti;  Location: [facility];  Service: Orthopedics;  Laterality: Right;  I&D right heel ulcer, placement of A-cell graft  . INCISION AND DRAINAGE ABSCESS N/A 10/28/2017   Procedure: INCISION AND DRAINAGE UMBILICAL HERNIA ABSCESS;  Surgeon: Ralene Ok, MD;  Location: [facility];  Service: General;  Laterality: N/A;  . INCISION AND DRAINAGE OF WOUND  08/29/2011   Procedure: IRRIGATION AND DEBRIDEMENT WOUND;  Surgeon: Theodoro Kos, DO;  Location: [facility];  Service:  Plastics;  Laterality: Right;  . INSERTION OF MESH N/A 10/16/2017   Procedure: INSERTION OF MESH;  Surgeon: Coralie Keens, MD;  [facility];  Service: General;  Laterality: N/A;  . LITHOTRIPSY     2007ish  . right elbow    . UMBILICAL HERNIA REPAIR N/A 10/16/2017   Procedure: UMBILICAL HERNIA REPAIR WITH MESH;  Surgeon: Coralie Keens, MD;  [facility];  Service: General;  Laterality: N/A;   Social History   Occupational History  . Not on file  Tobacco Use  . Smoking status: Never Smoker  . Smokeless tobacco: Never Used  Substance and Sexual Activity  . Alcohol use: No  . Drug use: No  . Sexual activity: Never

## 2019-08-19 ENCOUNTER — Ambulatory Visit: Payer: Medicare HMO

## 2019-11-11 ENCOUNTER — Ambulatory Visit: Payer: Medicare HMO | Admitting: Orthopaedic Surgery

## 2019-11-11 ENCOUNTER — Encounter: Payer: Self-pay | Admitting: Orthopaedic Surgery

## 2019-11-11 DIAGNOSIS — M25561 Pain in right knee: Secondary | ICD-10-CM | POA: Diagnosis not present

## 2019-11-11 DIAGNOSIS — M1711 Unilateral primary osteoarthritis, right knee: Secondary | ICD-10-CM

## 2019-11-11 DIAGNOSIS — M25562 Pain in left knee: Secondary | ICD-10-CM

## 2019-11-11 DIAGNOSIS — G8929 Other chronic pain: Secondary | ICD-10-CM | POA: Diagnosis not present

## 2019-11-11 DIAGNOSIS — M1712 Unilateral primary osteoarthritis, left knee: Secondary | ICD-10-CM

## 2019-11-11 MED ORDER — LIDOCAINE HCL 1 % IJ SOLN
3.0000 mL | INTRAMUSCULAR | Status: AC | PRN
  Administered 2019-11-11: 3 mL

## 2019-11-11 MED ORDER — METHYLPREDNISOLONE ACETATE 40 MG/ML IJ SUSP
40.0000 mg | INTRAMUSCULAR | Status: AC | PRN
  Administered 2019-11-11: 40 mg via INTRA_ARTICULAR

## 2019-11-11 NOTE — Progress Notes (Signed)
Office Visit Note   Patient: [name]           Date of Birth: [birth year]           MRN: [id number] Visit Date: 11/11/2019              Requested by: Pecolia Ades, NP No address on file PCP: Pecolia Ades, NP   Assessment & Plan: Visit Diagnoses:  1. Chronic pain of left knee   2. Chronic pain of right knee   3. Unilateral primary osteoarthritis, right knee   4. Unilateral primary osteoarthritis, left knee     Plan: I did place steroid injections in both knees without difficulty. All questions and concerns were answered and addressed. When she does come back in 3 months for repeat injections I would like our office to obtain a weight and calculated BMI.  Follow-Up Instructions: Return in about 3 months (around 02/11/2020).   Orders:  Orders Placed This Encounter  Procedures  . Large Joint Inj  . Large Joint Inj   No orders of the defined types were placed in this encounter.     Procedures: Large Joint Inj: R knee on 11/11/2019 4:40 PM Indications: diagnostic evaluation and pain Details: 22 G 1.5 in needle, superolateral approach  Arthrogram: No  Medications: 3 mL lidocaine 1 %; 40 mg methylPREDNISolone acetate 40 MG/ML Outcome: tolerated well, no immediate complications Procedure, treatment alternatives, risks and benefits explained, specific risks discussed. Consent was given by the patient. Immediately prior to procedure a time out was called to verify the correct patient, procedure, equipment, support staff and site/side marked as required. Patient was prepped and draped in the usual sterile fashion.   Large Joint Inj: L knee on 11/11/2019 4:40 PM Indications: diagnostic evaluation and pain Details: 22 G 1.5 in needle, superolateral approach  Arthrogram: No  Medications: 3 mL lidocaine 1 %; 40 mg methylPREDNISolone acetate 40 MG/ML Outcome: tolerated well, no immediate complications Procedure, treatment alternatives, risks and benefits  explained, specific risks discussed. Consent was given by the patient. Immediately prior to procedure a time out was called to verify the correct patient, procedure, equipment, support staff and site/side marked as required. Patient was prepped and draped in the usual sterile fashion.       Clinical Data: No additional findings.   Subjective: Chief Complaint  Patient presents with  . Left Knee - Follow-up, Pain  . Right Knee - Pain, Follow-up  The patient comes in today requesting steroid injections in both her knees. She has known osteoarthritis of both his knees and she is steroid injections about every 3 months. She is a diabetic but reports better control. Her last BMI of her primary care physician was 90. She reports that she has lost weight but according to the note she is actually gained weight. They reported the weight of 350 pounds. She has had no other acute change in her medical status she states. HPI  Review of Systems She currently denies any headache, chest pain, shortness of breath, fever, chills, nausea, vomiting  Objective: Vital Signs: There were no vitals taken for this visit.  Physical Exam She is alert and orient x3 and in no acute distress Ortho Exam Examination of both her knees shows patellofemoral crepitation and global tenderness. Both knees are morbidly obese. Specialty Comments:  No specialty comments available.  Imaging: No results found.   PMFS History: Patient Active Problem List   Diagnosis Date Noted  . History  of 2019 novel coronavirus disease (COVID-19) 03/25/2019  . Depression with anxiety 03/25/2019  . Chest pain 03/24/2019  . Postoperative abscess 10/27/2017  . Umbilical hernia, incarcerated, s/p repair 10/16/2017 10/16/2017  . Chronic pain of both knees 04/23/2017  . Unilateral primary osteoarthritis, left knee 10/24/2016  . Unilateral primary osteoarthritis, right knee 10/24/2016  . Abnormal mammogram with microcalcification-Right  inner lower quadrant 07/21/2013  . Oliguria and anuria 09/02/2012  . Constipation 03/02/2011  . Lethargy 03/01/2011  . Heel ulcer due to DM ([place]) 02/19/2011  . Wound, open, hip or thigh 02/19/2011  . Healing pressure ulcer stage III ([place]) 02/18/2011  . Morbidly obese ([place]) 02/18/2011  . Diabetes mellitus type 2 with complications, uncontrolled ([place]) 02/18/2011  . Sleep apnea 02/18/2011  . Hypothyroidism 02/18/2011  . Chronic pain 02/18/2011  . Acute lymphangitis 02/18/2011  . Cellulitis of foot 02/18/2011   Past Medical History:  Diagnosis Date  . Acute renal failure (ARF) ([place]) 09/02/2012  . Anemia   . Anxiety    panic attacks  . Back pain   . Blood transfusion   . Chronic heel ulcer ([place])   . Chronic kidney disease   . Chronic pain syndrome   . Depression   . Diabetes mellitus    type II   . DJD (degenerative joint disease)   . Dysrhythmia    pt unsure what this was  . Fibromyalgia   . GERD (gastroesophageal reflux disease)   . H/O: Bell's palsy 2011  . Heart murmur    "slight one"  . History of kidney stones   . History of MRSA infection OF ULCER  . Hypertension    no longer taken since 10-11 months per patient at preop phone call of 10/13/2017   . Hypothyroidism   . Hypoventilation associated with obesity syndrome ([place])   . Migraine   . Non-healing non-surgical wound    Right hip, has Wound vac to hip.  Started as a skin tear.  . Peripheral vascular disease ([place])   . PONV (postoperative nausea and vomiting)    can be slow to wake up after surgery  . Right foot drop   . Shortness of breath    with Activity  . Sleep apnea    "study shows not bad enough for CPAP.", pt denies  . Umbilical hernia   . URI (upper respiratory infection) 03/05/2011    Family History  Problem Relation Age of Onset  . Diabetes type II Father   . Heart attack Father   . Peripheral vascular disease Father   . Diabetes type II Mother   . Anesthesia problems Mother   . Heart attack  Mother   . Peripheral vascular disease Mother   . Hypertension Mother     Past Surgical History:  Procedure Laterality Date  . BACK SURGERY     for lumbar disc disease X2  . 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 BRAIN SURGERY      Plating due to soft spot closing too early- age 82  . BREAST LUMPECTOMY WITH NEEDLE LOCALIZATION Right 08/23/2013   Procedure: RIGHT BREAST LUMPECTOMY WITH NEEDLE LOCALIZATION;  Surgeon: Odis Hollingshead, MD;  Location: [facility];  Service: General;  Laterality: Right;  . CHOLECYSTECTOMY  1984  . EXCISION UMBILICAL NODULE N/A 1/61/0960   Procedure: EXCISION OF UMBILICUS;  Surgeon: Coralie Keens, MD;  [facility];  Service: General;  Laterality: N/A;  . EYE SURGERY  eye lift  . I & D EXTREMITY  04/02/2011   Procedure: IRRIGATION AND DEBRIDEMENT EXTREMITY;  Surgeon: Mcarthur Rossetti;  Location: [facility];  Service: Orthopedics;  Laterality: Right;  I&D right heel ulcer, placement of A-cell graft  . INCISION AND DRAINAGE ABSCESS N/A 10/28/2017   Procedure: INCISION AND DRAINAGE UMBILICAL HERNIA ABSCESS;  Surgeon: Ralene Ok, MD;  Location: [facility];  Service: General;  Laterality: N/A;  . INCISION AND DRAINAGE OF WOUND  08/29/2011   Procedure: IRRIGATION AND DEBRIDEMENT WOUND;  Surgeon: Theodoro Kos, DO;  Location: [facility];  Service: Plastics;  Laterality: Right;  . INSERTION OF MESH N/A 10/16/2017   Procedure: INSERTION OF MESH;  Surgeon: Coralie Keens, MD;  [facility];  Service: General;  Laterality: N/A;  . LITHOTRIPSY     2007ish  . right elbow    . UMBILICAL HERNIA REPAIR N/A 10/16/2017   Procedure: UMBILICAL HERNIA REPAIR WITH MESH;  Surgeon: Coralie Keens, MD;  [facility];  Service: General;  Laterality: N/A;   Social History   Occupational History  . Not on file  Tobacco Use  . Smoking status: Never Smoker  . Smokeless tobacco: Never Used  Vaping Use  . Vaping Use: Never used  Substance and Sexual  Activity  . Alcohol use: No  . Drug use: No  . Sexual activity: Never

## 2019-11-18 ENCOUNTER — Ambulatory Visit: Payer: Medicare HMO | Admitting: Orthopaedic Surgery

## 2019-11-23 ENCOUNTER — Other Ambulatory Visit: Payer: Self-pay | Admitting: Specialist

## 2019-11-23 DIAGNOSIS — M5417 Radiculopathy, lumbosacral region: Secondary | ICD-10-CM

## 2019-11-23 DIAGNOSIS — M5412 Radiculopathy, cervical region: Secondary | ICD-10-CM

## 2019-12-14 ENCOUNTER — Telehealth: Payer: Self-pay | Admitting: Orthopaedic Surgery

## 2019-12-14 NOTE — Telephone Encounter (Signed)
Patient called. She would like a RX for a wheelchair. Her call back number is [phone]

## 2019-12-15 NOTE — Telephone Encounter (Signed)
That will be fine given her obesity and severely arthritic knees.

## 2019-12-15 NOTE — Telephone Encounter (Signed)
Patient aware note is ready

## 2019-12-18 ENCOUNTER — Ambulatory Visit
Admission: RE | Admit: 2019-12-18 | Discharge: 2019-12-18 | Disposition: A | Discharge status: Home / Self Care | Payer: Medicare HMO | Source: Ambulatory Visit | Attending: Specialist | Admitting: Specialist

## 2019-12-18 DIAGNOSIS — M5417 Radiculopathy, lumbosacral region: Secondary | ICD-10-CM

## 2019-12-18 DIAGNOSIS — M5412 Radiculopathy, cervical region: Secondary | ICD-10-CM

## 2019-12-18 IMAGING — MR MR LUMBAR SPINE W/O CM
4 of 5 series · 24 of 48 positions shown · non-contrast
Comparison: Comparison made with prior CT of the abdomen and pelvis
from [DATE].

CLINICAL DATA: Initial evaluation for sharp pain and burning in
spine, weakness in both legs for past 6 months. History of prior
surgery.

EXAM:
MRI LUMBAR SPINE WITHOUT CONTRAST
TECHNIQUE: Multiplanar, multisequence MR imaging of the lumbar spine was
performed. No intravenous contrast was administered.

[Series 3: T2 post-contrast · sagittal · 4.0mm · 0.53mm/px · 6 of 16 slices shown]
[im 1/16]
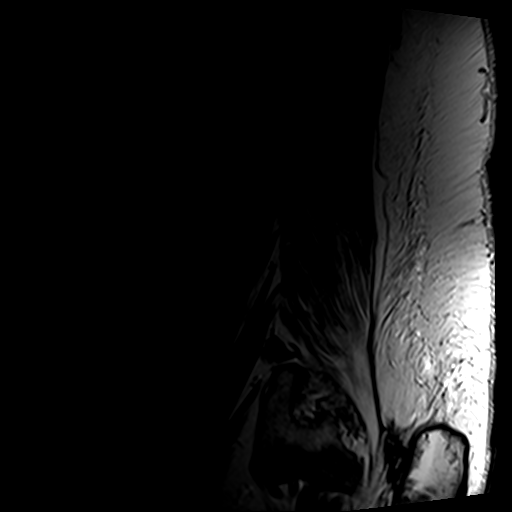
[im 4/16]
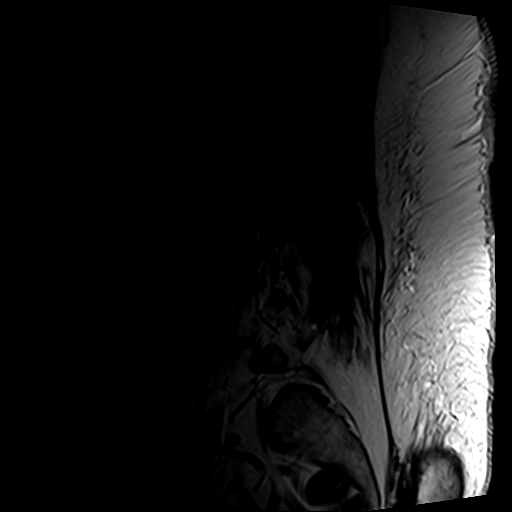
[im 7/16]
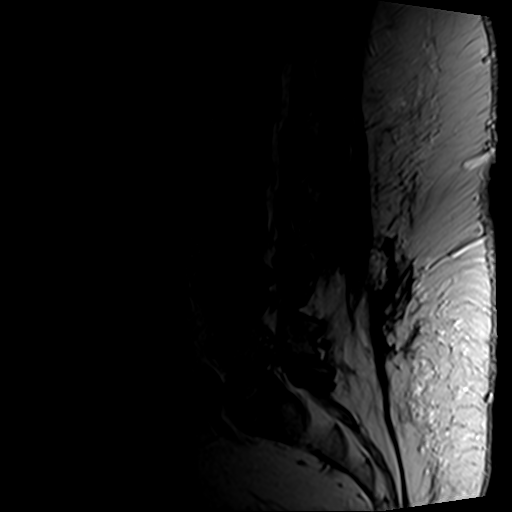
[im 10/16]
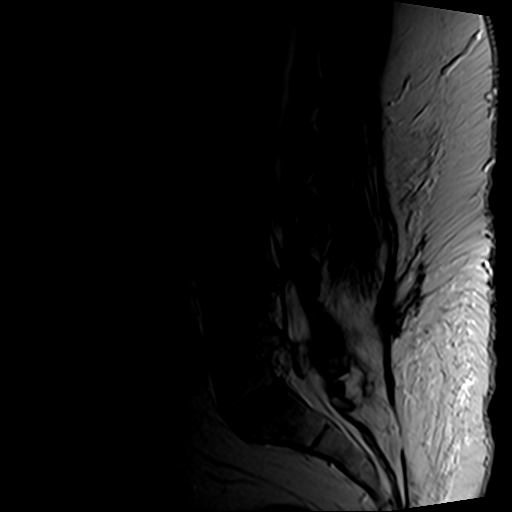
[im 13/16]
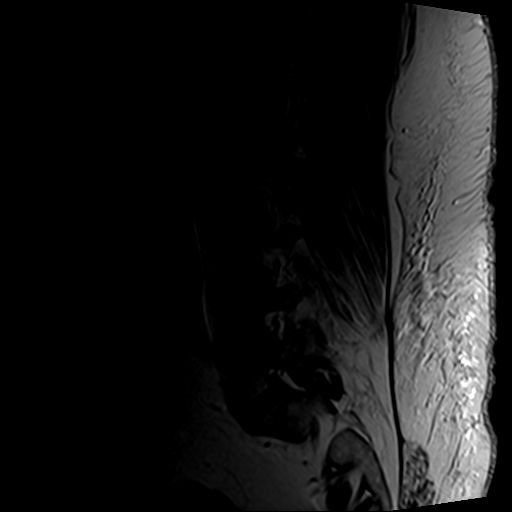
[im 16/16]
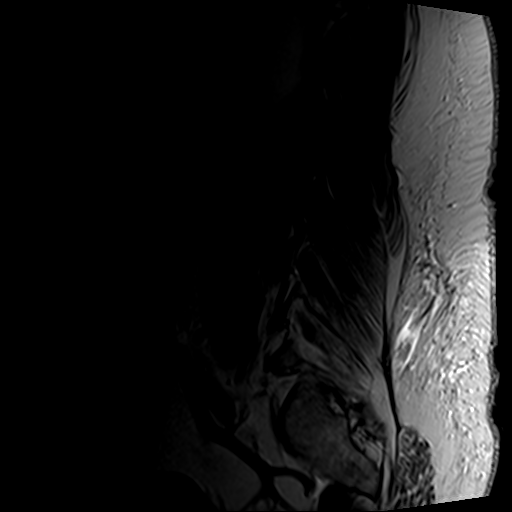

[Series 5: T1 · sagittal · 4.0mm · 0.53mm/px · 6 of 16 slices shown (1 of 2)]
[im 1/16]
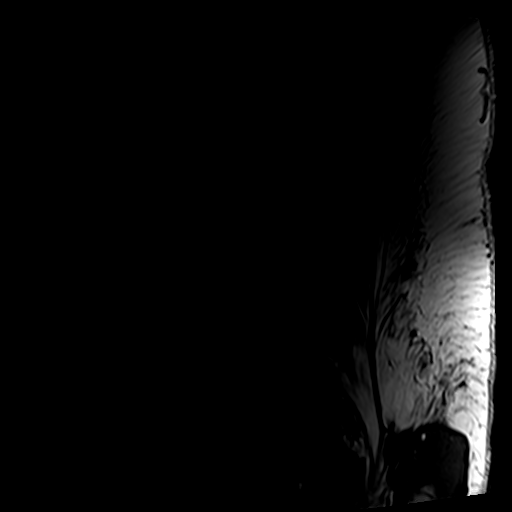
[im 4/16]
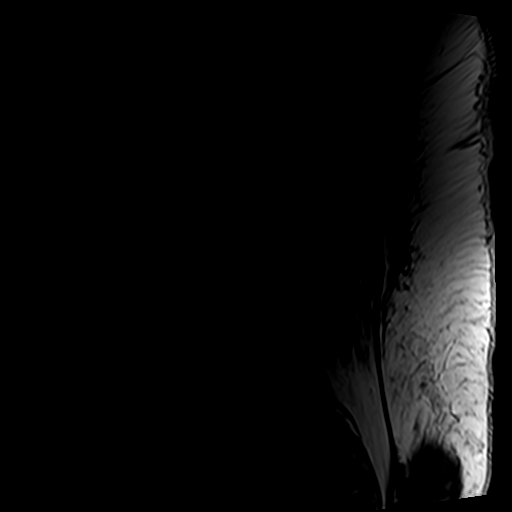
[im 7/16]
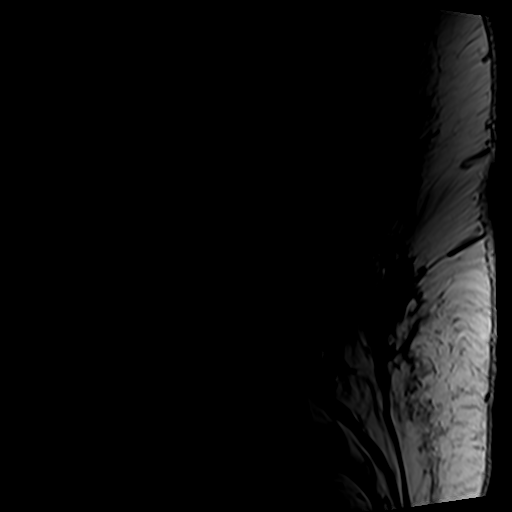
[im 10/16]
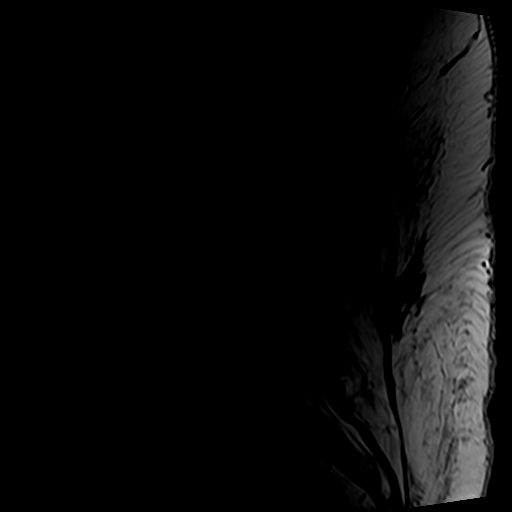
[im 13/16]
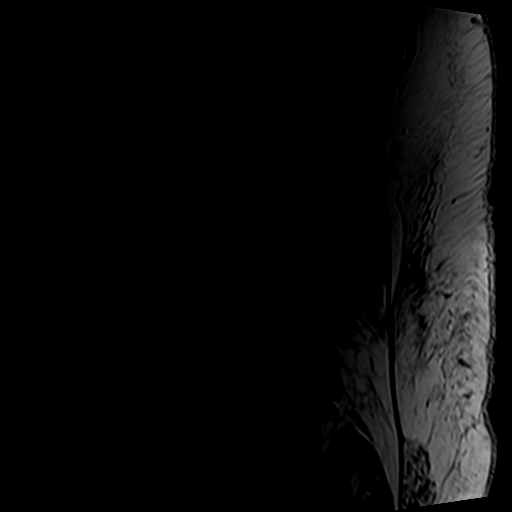
[im 16/16]
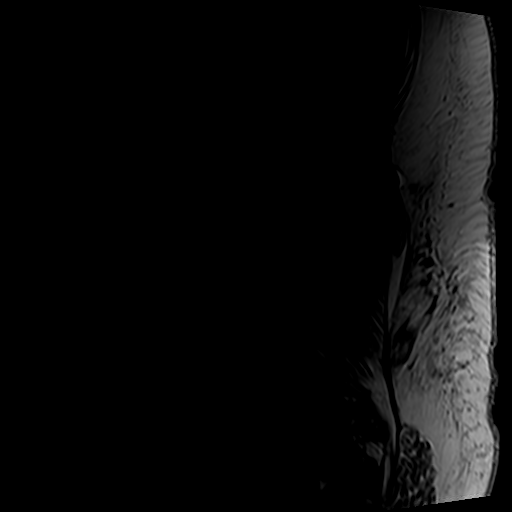

[Series 6: T2 · axial · 4.0mm · 0.70mm/px · z∈[-60,+158]mm · 9 of 40 slices shown]
[im 1/40]
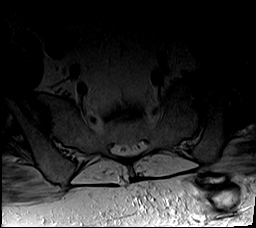
[im 6/40]
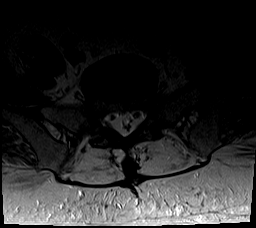
[im 12/40]
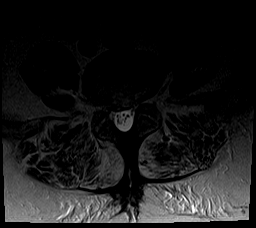
[im 17/40]
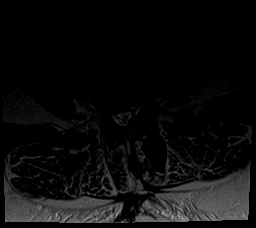
[im 20/40]
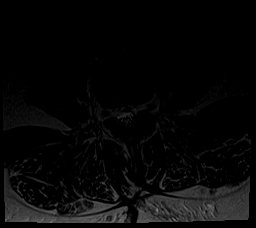
[im 23/40]
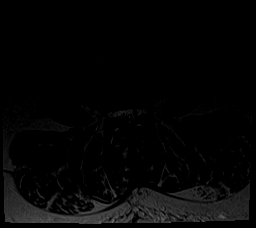
[im 28/40]
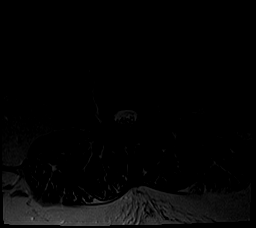
[im 34/40]
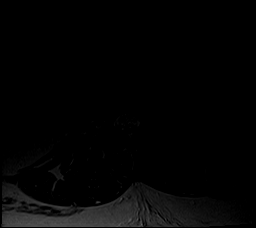
[im 40/40]
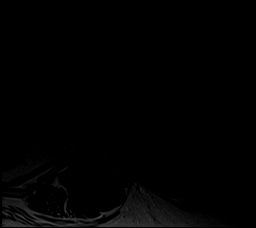

[Series 7: T1 · axial · 4.0mm · 0.35mm/px · z∈[-35,+127]mm · 3 of 40 slices shown (2 of 2)]
[im 6/40]
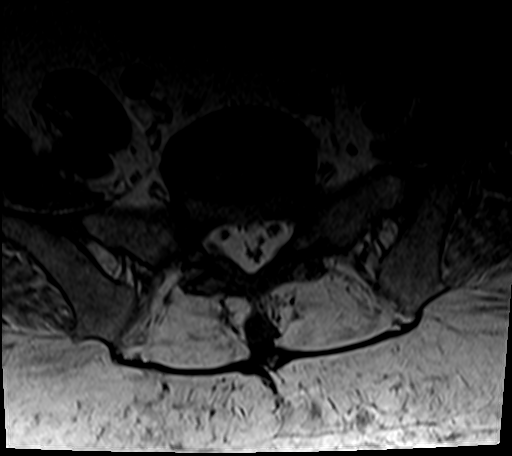
[im 20/40]
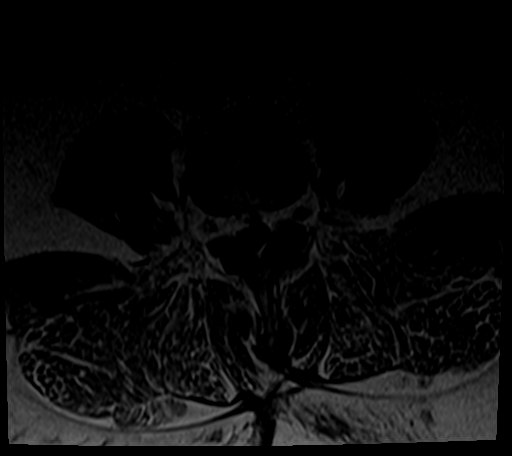
[im 34/40]
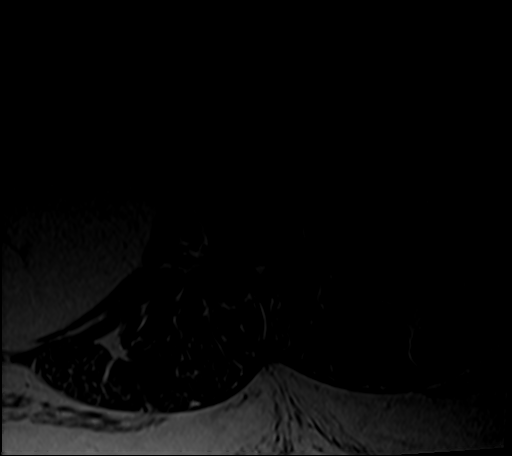

[24 of 48 positions shown; findings below may reference images not displayed]

FINDINGS: Segmentation: Standard. Lowest well-formed disc space labeled the
L5-S1 level.

Alignment: Straightening of the normal lumbar lordosis. 3 mm
anterolisthesis of L5 on S1, with trace retrolisthesis of L3 on L4.

Vertebrae: Vertebral body height maintained without acute or chronic
fracture. Bone marrow signal intensity within normal limits. No
discrete or worrisome osseous lesions. No abnormal marrow edema.

Conus medullaris and cauda equina: Conus extends to the T12 level.
Conus and cauda equina appear normal.

Paraspinal and other soft tissues: Chronic postoperative scarring
present within the lower posterior paraspinous soft tissues. 3.3 x
2.2 cm heterogeneous T2 hyperintense lesion partially visualized
posterior to the left ilium, corresponding with a partially
calcified fatty lesion seen on prior CT, indeterminate, likely
benign. Paraspinous soft tissues demonstrate no acute finding.
Partially visualized visceral structures otherwise unremarkable.

Disc levels:

T11-12: Diffuse disc bulge with disc desiccation. No significant
stenosis.

T12-L1: Unremarkable.

L1-2: Normal interspace. Mild to moderate bilateral facet
hypertrophy. No significant canal or foraminal stenosis.

L2-3: Negative interspace. Moderate facet and ligament flavum
hypertrophy with associated trace bilateral joint effusions.
Resultant mild spinal stenosis. Foramina remain patent.

L3-4: Degenerative intervertebral disc space narrowing with diffuse
disc bulge and disc desiccation. Associated reactive endplate
changes with marginal endplate spurring. Superimposed central disc
protrusion flattens the ventral thecal sac, slightly asymmetric to
the right. Associated annular fissure with slight caudad angulation.
Superimposed moderate bilateral facet hypertrophy. Resultant severe
spinal stenosis with the thecal sac measuring 6 mm in AP diameter at
its most narrow point. Mild left L3 foraminal narrowing. No
significant right foraminal encroachment.

L4-5: Advanced degenerative intervertebral disc space narrowing with
disc desiccation. Posterior endplate osseous spurring mildly
flattens the ventral thecal sac, asymmetric to the right. Prior
decompressive laminectomy. Moderate facet hypertrophy. Epidural
lipomatosis. Linear T2 hypointense band traversing the right aspect
of the thecal sac at this level likely reflects a fibrotic
band/synechiae (series 6, image 30). Thecal sac remains patent
without significant spinal stenosis. Mild right L4 foraminal
narrowing. No significant left foraminal encroachment.

L5-S1: Disc desiccation with minimal annular disc bulge. Moderate to
advanced bilateral facet hypertrophy. Epidural lipomatosis
compresses the distal thecal sac. No other significant spinal
stenosis. Mild to moderate bilateral L5 foraminal narrowing, left
slightly worse than right, largely due to listhesis and facet
hypertrophy.
IMPRESSION: 1. Prior decompressive laminectomy at L4-5 without residual spinal
stenosis.
2. Adjacent segment disease with multifactorial degenerative changes
at L3-4 with resultant severe spinal stenosis.
3. Mild to moderate bilateral L5 foraminal stenosis, largely related
to trace listhesis and facet hypertrophy at this level.

## 2019-12-18 IMAGING — MR MR CERVICAL SPINE W/O CM
4 of 5 series · 27 of 48 positions shown · non-contrast
Comparison: None available.

CLINICAL DATA: Initial evaluation for sharp pain and burning in
spine, pain and weakness in both legs for past 6 months.

EXAM:
MRI CERVICAL SPINE WITHOUT CONTRAST
TECHNIQUE: Multiplanar, multisequence MR imaging of the cervical spine was
performed. No intravenous contrast was administered.

[Series 3: T2 · sagittal · 3.0mm · 0.66mm/px · 6 of 14 slices shown (1 of 2)]
[im 1/14]
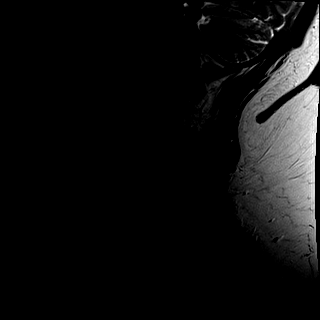
[im 3/14]
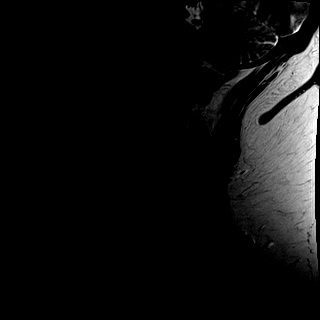
[im 6/14]
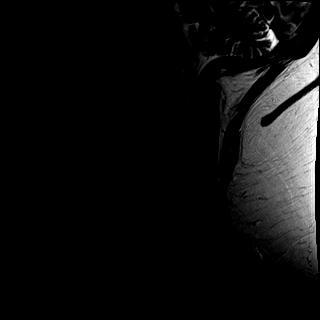
[im 8/14]
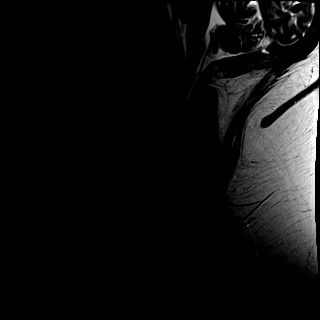
[im 11/14]
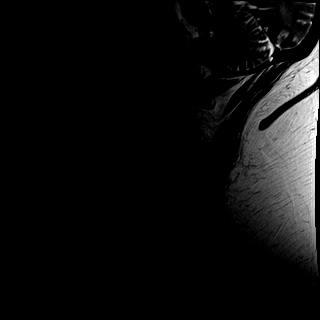
[im 14/14]
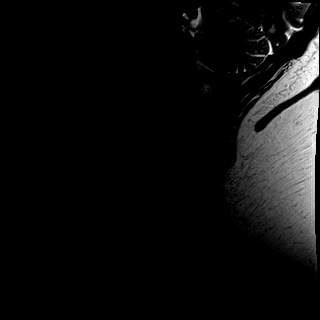

[Series 4: T1 · sagittal · 3.0mm · 0.41mm/px · 7 of 14 slices shown]
[im 1/14]
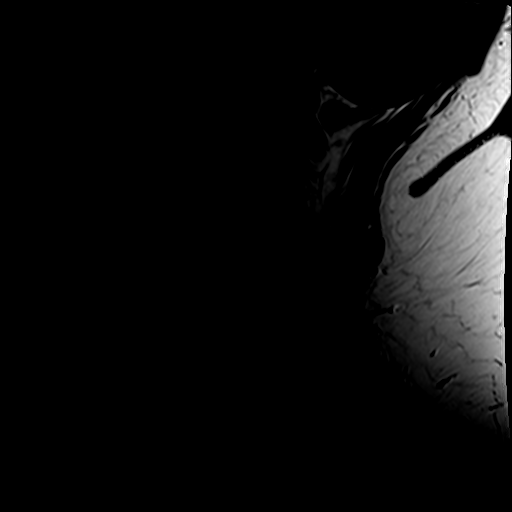
[im 3/14]
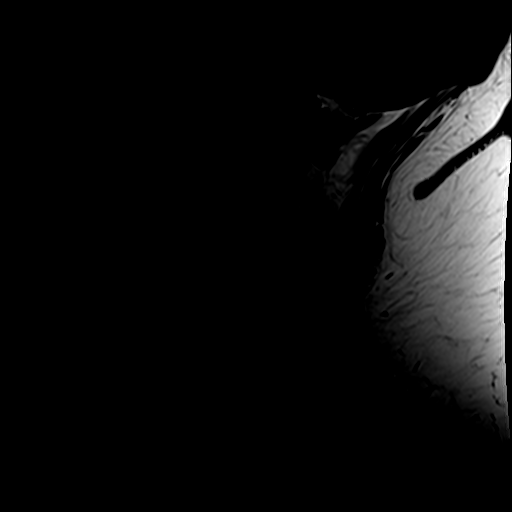
[im 5/14]
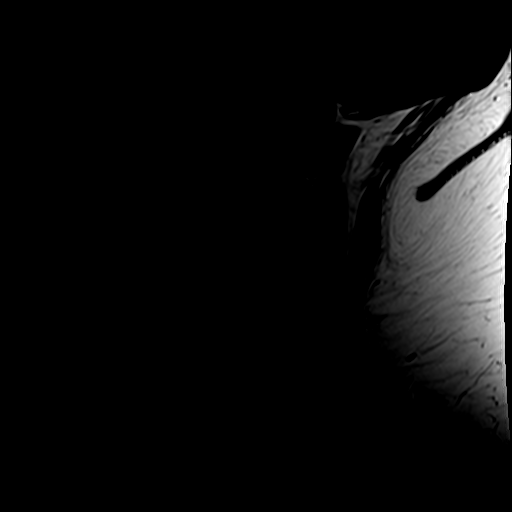
[im 7/14]
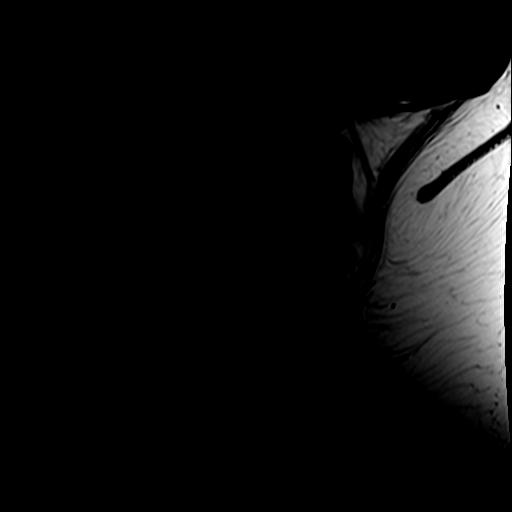
[im 9/14]
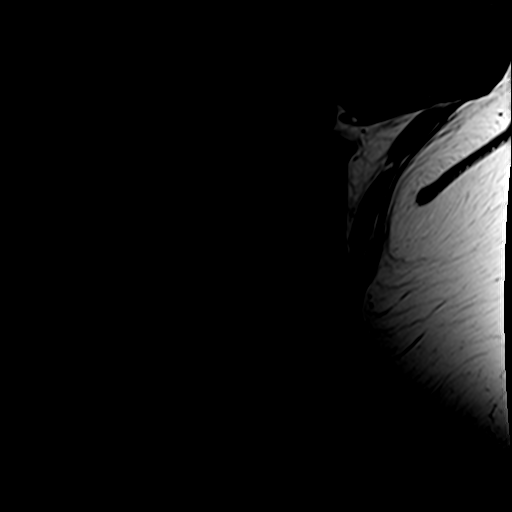
[im 11/14]
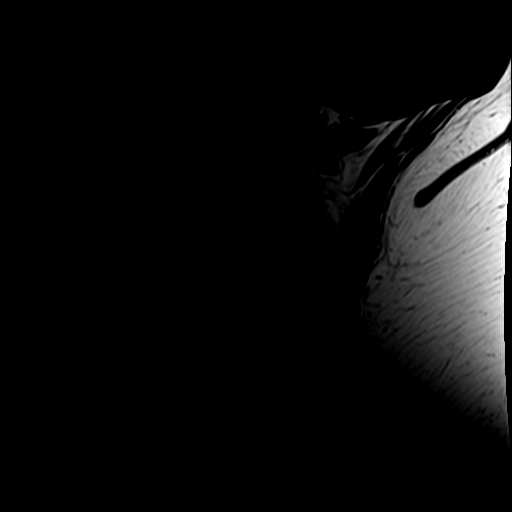
[im 14/14]
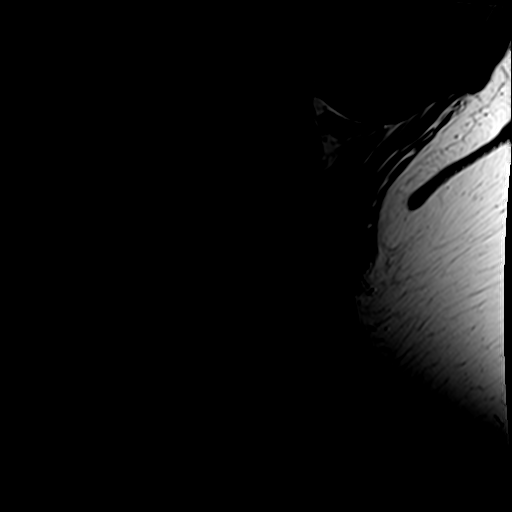

[Series 5: tir sag · sagittal · 3.0mm · 0.41mm/px · 6 of 14 slices shown]
[im 1/14]
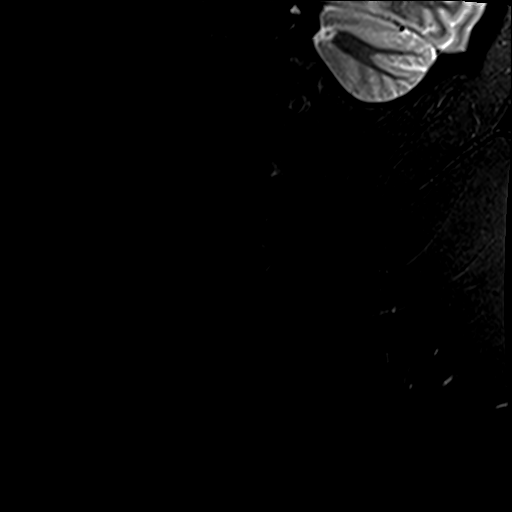
[im 3/14]
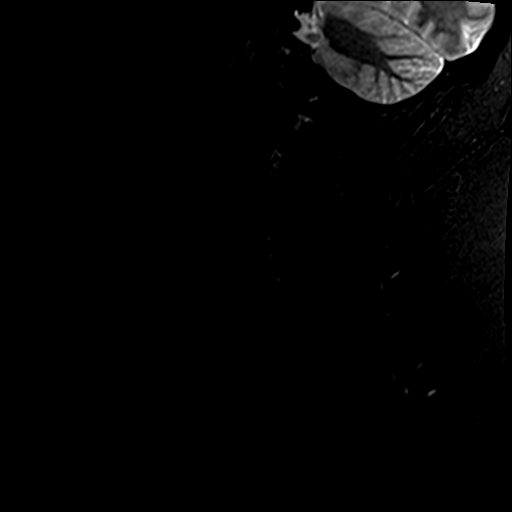
[im 5/14]
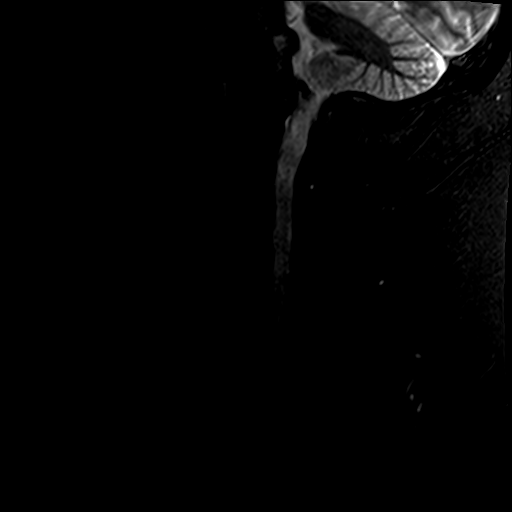
[im 7/14]
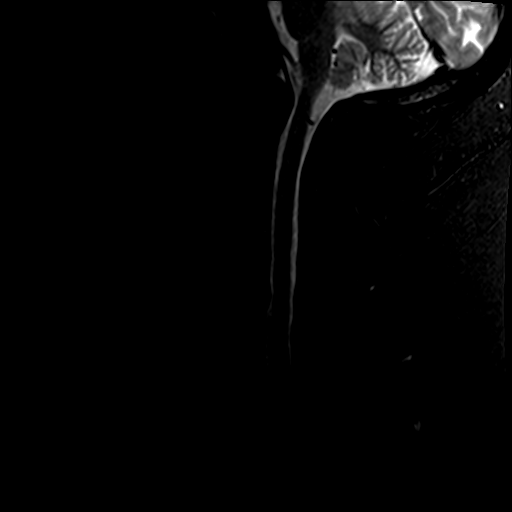
[im 9/14]
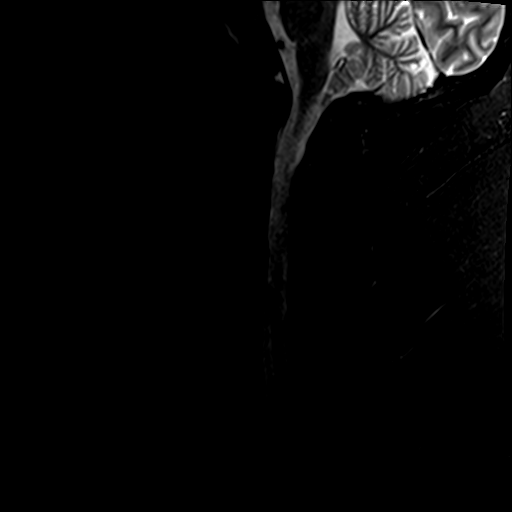
[im 11/14]
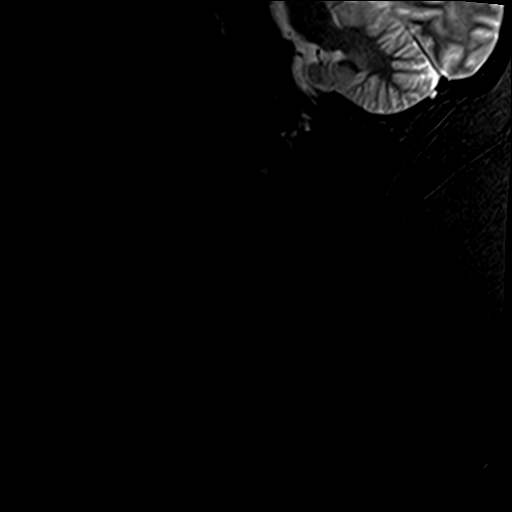

[Series 7: T2 · axial · 3.0mm · 0.70mm/px · z∈[-107,+2]mm · 8 of 30 slices shown (2 of 2)]
[im 1/30]
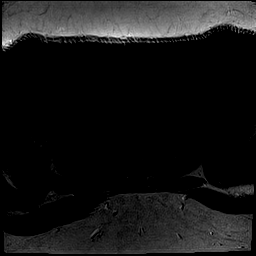
[im 5/30]
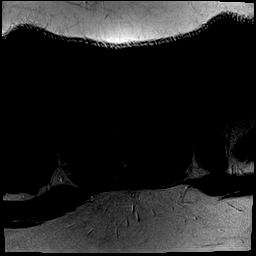
[im 9/30]
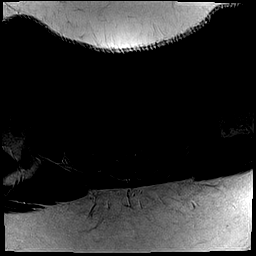
[im 14/30]
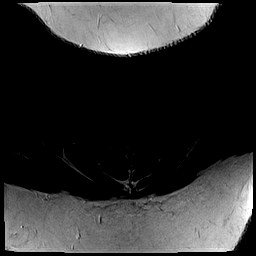
[im 16/30]
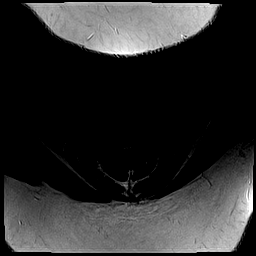
[im 21/30]
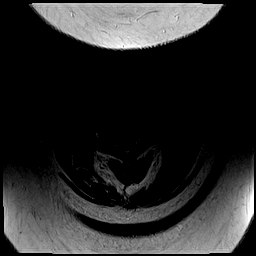
[im 25/30]
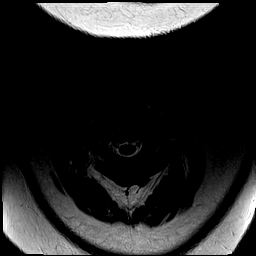
[im 30/30]
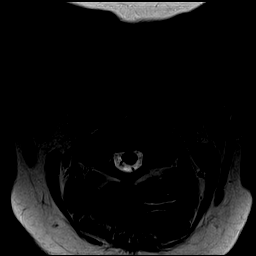

[27 of 48 positions shown; findings below may reference images not displayed]

FINDINGS: Alignment: Straightening of the normal cervical lordosis. No
listhesis.

Vertebrae: Vertebral body height maintained without acute or chronic
fracture. Bone marrow signal intensity within normal limits. Benign
hemangioma noted involving the posterior aspect of the T2 vertebral
body with extension into the right pedicle. No other discrete or
worrisome osseous lesions. No abnormal marrow edema.

Cord: Normal signal and morphology.

Posterior Fossa, vertebral arteries, paraspinal tissues: Visualized
brain and posterior fossa within normal limits. Craniocervical
junction normal. Paraspinous and prevertebral soft tissues within
normal limits. Normal intravascular flow voids seen within the
vertebral arteries bilaterally.

Disc levels:

C2-C3: Normal interspace. Mild left-sided facet hypertrophy. No
canal or foraminal stenosis.

C3-C4: Right-sided uncovertebral hypertrophy without significant
disc bulge. Mild left-sided facet and ligament flavum hypertrophy.
No significant spinal stenosis. Foramina remain patent.

C4-C5: Mild disc bulge. No significant spinal stenosis. Foramina
remain patent.

C5-C6: Diffuse disc bulge with mild uncovertebral hypertrophy.
Flattening and partial effacement of the ventral thecal sac.
Superimposed mild ligament flavum hypertrophy. No more than mild
spinal stenosis without cord deformity or impingement. Foramina
remain patent.

C6-C7: Mild annular disc bulge. Minimal ligament flavum thickening.
No significant canal or foraminal stenosis.

C7-T1: Normal interspace. Mild facet hypertrophy. No canal or
foraminal stenosis.

Visualized upper thoracic spine demonstrates no significant finding.
IMPRESSION: 1. Mild disc bulging with uncovertebral hypertrophy at C5-6 with
resultant mild spinal stenosis but no cord impingement or deformity.
2. Additional mild noncompressive disc bulging at C4-5 and C6-7
without significant stenosis or impingement.

## 2019-12-28 ENCOUNTER — Telehealth: Payer: Self-pay

## 2019-12-28 NOTE — Telephone Encounter (Signed)
Patient came to pick prescription for wheelchair . Prescription isn't in the bin behind me call back:[phone]

## 2019-12-28 NOTE — Telephone Encounter (Signed)
Patient came in to pick up prescription for wheelchair, prescription isn't up here call back:[phone]

## 2020-02-10 ENCOUNTER — Ambulatory Visit: Payer: Medicare HMO | Admitting: Orthopaedic Surgery

## 2020-02-16 ENCOUNTER — Ambulatory Visit: Payer: Medicare HMO | Admitting: Orthopaedic Surgery

## 2020-02-16 ENCOUNTER — Encounter: Payer: Self-pay | Admitting: Orthopaedic Surgery

## 2020-02-16 VITALS — Ht 65.0 in | Wt 350.0 lb

## 2020-02-16 DIAGNOSIS — M25561 Pain in right knee: Secondary | ICD-10-CM

## 2020-02-16 DIAGNOSIS — G8929 Other chronic pain: Secondary | ICD-10-CM | POA: Diagnosis not present

## 2020-02-16 DIAGNOSIS — M25562 Pain in left knee: Secondary | ICD-10-CM

## 2020-02-16 MED ORDER — LIDOCAINE HCL 1 % IJ SOLN
3.0000 mL | INTRAMUSCULAR | Status: AC | PRN
  Administered 2020-02-16: 3 mL

## 2020-02-16 MED ORDER — METHYLPREDNISOLONE ACETATE 40 MG/ML IJ SUSP
40.0000 mg | INTRAMUSCULAR | Status: AC | PRN
  Administered 2020-02-16: 40 mg via INTRA_ARTICULAR

## 2020-02-16 NOTE — Progress Notes (Signed)
Office Visit Note   Patient: [name]           Date of Birth: September 16, 1970           MRN: [id number] Visit Date: 02/16/2020              Requested by: Pecolia Ades, NP No address on file PCP: Pecolia Ades, NP   Assessment & Plan: Visit Diagnoses:  1. Chronic pain of left knee   2. Chronic pain of right knee     Plan: She understands that steroid injections can increase her blood glucose.  I placed a steroid injection in both knees without a lot of difficulty.  All questions and concerns were answered and addressed.  She knows that we can repeat these again in 3 to 4 months if needed.  Follow-Up Instructions: Return if symptoms worsen or fail to improve.   Orders:  Orders Placed This Encounter  Procedures  . Large Joint Inj  . Large Joint Inj   No orders of the defined types were placed in this encounter.     Procedures: Large Joint Inj: R knee on 02/16/2020 3:52 PM Indications: diagnostic evaluation and pain Details: 22 G 1.5 in needle, superolateral approach  Arthrogram: No  Medications: 3 mL lidocaine 1 %; 40 mg methylPREDNISolone acetate 40 MG/ML Outcome: tolerated well, no immediate complications Procedure, treatment alternatives, risks and benefits explained, specific risks discussed. Consent was given by the patient. Immediately prior to procedure a time out was called to verify the correct patient, procedure, equipment, support staff and site/side marked as required. Patient was prepped and draped in the usual sterile fashion.   Large Joint Inj: L knee on 02/16/2020 3:52 PM Indications: diagnostic evaluation and pain Details: 22 G 1.5 in needle, superolateral approach  Arthrogram: No  Medications: 3 mL lidocaine 1 %; 40 mg methylPREDNISolone acetate 40 MG/ML Outcome: tolerated well, no immediate complications Procedure, treatment alternatives, risks and benefits explained, specific risks discussed. Consent was given by the patient.  Immediately prior to procedure a time out was called to verify the correct patient, procedure, equipment, support staff and site/side marked as required. Patient was prepped and draped in the usual sterile fashion.       Clinical Data: No additional findings.   Subjective: Chief Complaint  Patient presents with  . Left Knee - Pain  . Right Knee - Pain  The patient comes in today due to severe pain in both her knees and known end-stage arthritis.  She is someone who is battled morbid obesity for many years now.  Her knees are quite deformed and in need of knee replacement surgery.  However, her BMI is too high for [name] to even consider this.  She has been a poorly controlled diabetic in the past but now she reports her hemoglobin A1c is down to 7.1.  She is requesting steroid injections in both knees today.  She does report weight gain as well.  HPI  Review of Systems She currently denies any headache, chest pain, shortness of breath, fever, chills, nausea, vomiting  Objective: Vital Signs: Ht 5\' 5"  (1.651 m)   Wt (!) 350 lb (158.8 kg)   BMI 58.24 kg/m   Physical Exam She is alert and orient x3 and in no acute distress Ortho Exam Examination of both knees shows global tenderness with significant patellofemoral crepitation. Specialty Comments:  No specialty comments available.  Imaging: No results found.   PMFS History: Patient Active  Problem List   Diagnosis Date Noted  . History of 2019 novel coronavirus disease (COVID-19) 03/25/2019  . Depression with anxiety 03/25/2019  . Chest pain 03/24/2019  . Postoperative abscess 10/27/2017  . Umbilical hernia, incarcerated, s/p repair 10/16/2017 10/16/2017  . Chronic pain of both knees 04/23/2017  . Unilateral primary osteoarthritis, left knee 10/24/2016  . Unilateral primary osteoarthritis, right knee 10/24/2016  . Abnormal mammogram with microcalcification-Right inner lower quadrant 07/21/2013  . Oliguria and anuria  09/02/2012  . Constipation 03/02/2011  . Lethargy 03/01/2011  . Heel ulcer due to DM ([place]) 02/19/2011  . Wound, open, hip or thigh 02/19/2011  . Healing pressure ulcer stage III ([place]) 02/18/2011  . Morbidly obese ([place]) 02/18/2011  . Diabetes mellitus type 2 with complications, uncontrolled ([place]) 02/18/2011  . Sleep apnea 02/18/2011  . Hypothyroidism 02/18/2011  . Chronic pain 02/18/2011  . Acute lymphangitis 02/18/2011  . Cellulitis of foot 02/18/2011   Past Medical History:  Diagnosis Date  . Acute renal failure (ARF) ([place]) 09/02/2012  . Anemia   . Anxiety    panic attacks  . Back pain   . Blood transfusion   . Chronic heel ulcer ([place])   . Chronic kidney disease   . Chronic pain syndrome   . Depression   . Diabetes mellitus    type II   . DJD (degenerative joint disease)   . Dysrhythmia    pt unsure what this was  . Fibromyalgia   . GERD (gastroesophageal reflux disease)   . H/O: Bell's palsy 2011  . Heart murmur    "slight one"  . History of kidney stones   . History of MRSA infection OF ULCER  . Hypertension    no longer taken since 10-11 months per patient at preop phone call of 10/13/2017   . Hypothyroidism   . Hypoventilation associated with obesity syndrome ([place])   . Migraine   . Non-healing non-surgical wound    Right hip, has Wound vac to hip.  Started as a skin tear.  . Peripheral vascular disease ([place])   . PONV (postoperative nausea and vomiting)    can be slow to wake up after surgery  . Right foot drop   . Shortness of breath    with Activity  . Sleep apnea    "study shows not bad enough for CPAP.", pt denies  . Umbilical hernia   . URI (upper respiratory infection) 03/05/2011    Family History  Problem Relation Age of Onset  . Diabetes type II Father   . Heart attack Father   . Peripheral vascular disease Father   . Diabetes type II Mother   . Anesthesia problems Mother   . Heart attack Mother   . Peripheral vascular disease Mother   .  Hypertension Mother     Past Surgical History:  Procedure Laterality Date  . BACK SURGERY     for lumbar disc disease X2  . 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 BRAIN SURGERY      Plating due to soft spot closing too early- age 5  . BREAST LUMPECTOMY WITH NEEDLE LOCALIZATION Right 08/23/2013   Procedure: RIGHT BREAST LUMPECTOMY WITH NEEDLE LOCALIZATION;  Surgeon: Odis Hollingshead, MD;  Location: [facility];  Service: General;  Laterality: Right;  . CHOLECYSTECTOMY  1984  . EXCISION UMBILICAL NODULE N/A 0/93/2671   Procedure: EXCISION OF UMBILICUS;  Surgeon: Coralie Keens, MD;  Location: [facility];  Service: General;  Laterality: N/A;  . EYE SURGERY     eye lift  . I & D EXTREMITY  04/02/2011   Procedure: IRRIGATION AND DEBRIDEMENT EXTREMITY;  Surgeon: Mcarthur Rossetti;  Location: [facility];  Service: Orthopedics;  Laterality: Right;  I&D right heel ulcer, placement of A-cell graft  . INCISION AND DRAINAGE ABSCESS N/A 10/28/2017   Procedure: INCISION AND DRAINAGE UMBILICAL HERNIA ABSCESS;  Surgeon: Ralene Ok, MD;  Location: [facility];  Service: General;  Laterality: N/A;  . INCISION AND DRAINAGE OF WOUND  08/29/2011   Procedure: IRRIGATION AND DEBRIDEMENT WOUND;  Surgeon: Theodoro Kos, DO;  Location: [facility];  Service: Plastics;  Laterality: Right;  . INSERTION OF MESH N/A 10/16/2017   Procedure: INSERTION OF MESH;  Surgeon: Coralie Keens, MD;  Location: [facility];  Service: General;  Laterality: N/A;  . LITHOTRIPSY     2007ish  . right elbow    . UMBILICAL HERNIA REPAIR N/A 10/16/2017   Procedure: UMBILICAL HERNIA REPAIR WITH MESH;  Surgeon: Coralie Keens, MD;  Location: [facility];  Service: General;  Laterality: N/A;   Social History   Occupational History  . Not on file  Tobacco Use  . Smoking status: Never Smoker  . Smokeless tobacco: Never Used  Vaping Use  . Vaping Use: Never used  Substance and Sexual Activity  . Alcohol use: No  . Drug use: No  . Sexual  activity: Never

## 2020-03-07 ENCOUNTER — Other Ambulatory Visit: Payer: Self-pay

## 2020-03-07 ENCOUNTER — Emergency Department (HOSPITAL_COMMUNITY): Payer: Medicare Other

## 2020-03-07 ENCOUNTER — Encounter (HOSPITAL_COMMUNITY): Payer: Self-pay | Admitting: Emergency Medicine

## 2020-03-07 ENCOUNTER — Inpatient Hospital Stay (HOSPITAL_COMMUNITY)
Admission: EM | Admit: 2020-03-07 | Discharge: 2020-03-12 | DRG: 638 | Disposition: A | Discharge status: Home / Self Care | Payer: Medicare Other | Attending: Internal Medicine | Admitting: Internal Medicine

## 2020-03-07 DIAGNOSIS — R300 Dysuria: Secondary | ICD-10-CM

## 2020-03-07 DIAGNOSIS — G8929 Other chronic pain: Secondary | ICD-10-CM | POA: Diagnosis present

## 2020-03-07 DIAGNOSIS — Z79899 Other long term (current) drug therapy: Secondary | ICD-10-CM

## 2020-03-07 DIAGNOSIS — I129 Hypertensive chronic kidney disease with stage 1 through stage 4 chronic kidney disease, or unspecified chronic kidney disease: Secondary | ICD-10-CM | POA: Diagnosis not present

## 2020-03-07 DIAGNOSIS — G510 Bell's palsy: Secondary | ICD-10-CM | POA: Diagnosis not present

## 2020-03-07 DIAGNOSIS — Y92239 Unspecified place in hospital as the place of occurrence of the external cause: Secondary | ICD-10-CM | POA: Diagnosis present

## 2020-03-07 DIAGNOSIS — F410 Panic disorder [episodic paroxysmal anxiety] without agoraphobia: Secondary | ICD-10-CM | POA: Diagnosis present

## 2020-03-07 DIAGNOSIS — Z886 Allergy status to analgesic agent status: Secondary | ICD-10-CM | POA: Diagnosis not present

## 2020-03-07 DIAGNOSIS — E1122 Type 2 diabetes mellitus with diabetic chronic kidney disease: Secondary | ICD-10-CM | POA: Diagnosis not present

## 2020-03-07 DIAGNOSIS — M797 Fibromyalgia: Secondary | ICD-10-CM | POA: Diagnosis not present

## 2020-03-07 DIAGNOSIS — E1165 Type 2 diabetes mellitus with hyperglycemia: Secondary | ICD-10-CM | POA: Diagnosis not present

## 2020-03-07 DIAGNOSIS — F40240 Claustrophobia: Secondary | ICD-10-CM | POA: Diagnosis not present

## 2020-03-07 DIAGNOSIS — E1151 Type 2 diabetes mellitus with diabetic peripheral angiopathy without gangrene: Secondary | ICD-10-CM | POA: Diagnosis present

## 2020-03-07 DIAGNOSIS — K219 Gastro-esophageal reflux disease without esophagitis: Secondary | ICD-10-CM | POA: Diagnosis present

## 2020-03-07 DIAGNOSIS — T426X5A Adverse effect of other antiepileptic and sedative-hypnotic drugs, initial encounter: Secondary | ICD-10-CM | POA: Diagnosis not present

## 2020-03-07 DIAGNOSIS — N390 Urinary tract infection, site not specified: Secondary | ICD-10-CM | POA: Diagnosis present

## 2020-03-07 DIAGNOSIS — Z20822 Contact with and (suspected) exposure to covid-19: Secondary | ICD-10-CM | POA: Diagnosis present

## 2020-03-07 DIAGNOSIS — E662 Morbid (severe) obesity with alveolar hypoventilation: Secondary | ICD-10-CM | POA: Diagnosis present

## 2020-03-07 DIAGNOSIS — R569 Unspecified convulsions: Secondary | ICD-10-CM

## 2020-03-07 DIAGNOSIS — N189 Chronic kidney disease, unspecified: Secondary | ICD-10-CM | POA: Diagnosis present

## 2020-03-07 DIAGNOSIS — Z79890 Hormone replacement therapy (postmenopausal): Secondary | ICD-10-CM

## 2020-03-07 DIAGNOSIS — Z888 Allergy status to other drugs, medicaments and biological substances status: Secondary | ICD-10-CM

## 2020-03-07 DIAGNOSIS — Z8249 Family history of ischemic heart disease and other diseases of the circulatory system: Secondary | ICD-10-CM

## 2020-03-07 DIAGNOSIS — G4730 Sleep apnea, unspecified: Secondary | ICD-10-CM | POA: Diagnosis present

## 2020-03-07 DIAGNOSIS — E118 Type 2 diabetes mellitus with unspecified complications: Secondary | ICD-10-CM

## 2020-03-07 DIAGNOSIS — E119 Type 2 diabetes mellitus without complications: Secondary | ICD-10-CM | POA: Diagnosis present

## 2020-03-07 DIAGNOSIS — Z880 Allergy status to penicillin: Secondary | ICD-10-CM | POA: Diagnosis not present

## 2020-03-07 DIAGNOSIS — F32A Depression, unspecified: Secondary | ICD-10-CM | POA: Diagnosis present

## 2020-03-07 DIAGNOSIS — Z882 Allergy status to sulfonamides status: Secondary | ICD-10-CM | POA: Diagnosis not present

## 2020-03-07 DIAGNOSIS — N179 Acute kidney failure, unspecified: Secondary | ICD-10-CM | POA: Diagnosis not present

## 2020-03-07 DIAGNOSIS — E039 Hypothyroidism, unspecified: Secondary | ICD-10-CM | POA: Diagnosis present

## 2020-03-07 DIAGNOSIS — G40909 Epilepsy, unspecified, not intractable, without status epilepticus: Secondary | ICD-10-CM

## 2020-03-07 DIAGNOSIS — R739 Hyperglycemia, unspecified: Secondary | ICD-10-CM

## 2020-03-07 DIAGNOSIS — R251 Tremor, unspecified: Secondary | ICD-10-CM | POA: Insufficient documentation

## 2020-03-07 DIAGNOSIS — Z6841 Body Mass Index (BMI) 40.0 and over, adult: Secondary | ICD-10-CM

## 2020-03-07 DIAGNOSIS — M21371 Foot drop, right foot: Secondary | ICD-10-CM | POA: Diagnosis present

## 2020-03-07 DIAGNOSIS — Z794 Long term (current) use of insulin: Secondary | ICD-10-CM

## 2020-03-07 DIAGNOSIS — IMO0002 Reserved for concepts with insufficient information to code with codable children: Secondary | ICD-10-CM | POA: Diagnosis present

## 2020-03-07 DIAGNOSIS — G894 Chronic pain syndrome: Secondary | ICD-10-CM | POA: Diagnosis present

## 2020-03-07 DIAGNOSIS — Z833 Family history of diabetes mellitus: Secondary | ICD-10-CM

## 2020-03-07 DIAGNOSIS — G253 Myoclonus: Secondary | ICD-10-CM | POA: Diagnosis present

## 2020-03-07 DIAGNOSIS — F418 Other specified anxiety disorders: Secondary | ICD-10-CM

## 2020-03-07 DIAGNOSIS — E785 Hyperlipidemia, unspecified: Secondary | ICD-10-CM | POA: Diagnosis present

## 2020-03-07 LAB — URINALYSIS, ROUTINE W REFLEX MICROSCOPIC
Glucose, UA: >=500 mg/dL — AB
Hgb urine dipstick: NEGATIVE
Ketones, ur: NEGATIVE mg/dL
Nitrite: NEGATIVE
Protein, ur: 30 mg/dL — AB
Specific Gravity, Urine: 1.027 (ref 1.005–1.030)
pH: 5 (ref 5.0–8.0)

## 2020-03-07 LAB — I-STAT BETA HCG BLOOD, ED (MC, WL, AP ONLY): I-stat hCG, quantitative: <5 m[IU]/mL (ref <5)

## 2020-03-07 LAB — CBC
HCT: 44.5 % (ref 36.0–46.0)
Hemoglobin: 15 g/dL (ref 12.0–15.0)
MCH: 31.4 pg (ref 26.0–34.0)
MCHC: 33.7 g/dL (ref 30.0–36.0)
MCV: 93.3 fL (ref 80.0–100.0)
Platelets: 233 10*3/uL (ref 150–400)
RBC: 4.77 MIL/uL (ref 3.87–5.11)
RDW: 13.1 % (ref 11.5–15.5)
WBC: 11.6 10*3/uL — ABNORMAL HIGH (ref 4.0–10.5)
nRBC: 0 % (ref 0.0–0.2)

## 2020-03-07 LAB — BASIC METABOLIC PANEL
Anion gap: 12 (ref 5–15)
Anion gap: 14 (ref 5–15)
BUN: 25 mg/dL — ABNORMAL HIGH (ref 6–20)
BUN: 35 mg/dL — ABNORMAL HIGH (ref 6–20)
CO2: 19 mmol/L — ABNORMAL LOW (ref 22–32)
CO2: 19 mmol/L — ABNORMAL LOW (ref 22–32)
Calcium: 9 mg/dL (ref 8.9–10.3)
Calcium: 9 mg/dL (ref 8.9–10.3)
Chloride: 100 mmol/L (ref 98–111)
Chloride: 97 mmol/L — ABNORMAL LOW (ref 98–111)
Creatinine, Ser: 1.95 mg/dL — ABNORMAL HIGH (ref 0.44–1.00)
Creatinine, Ser: 2.27 mg/dL — ABNORMAL HIGH (ref 0.44–1.00)
GFR, Estimated: 31 mL/min — ABNORMAL LOW (ref >60)
GFR, Estimated: 26 mL/min — ABNORMAL LOW (ref >60)
Glucose, Bld: 304 mg/dL — ABNORMAL HIGH (ref 70–99)
Glucose, Bld: 388 mg/dL — ABNORMAL HIGH (ref 70–99)
Potassium: 3.8 mmol/L (ref 3.5–5.1)
Potassium: 3.9 mmol/L (ref 3.5–5.1)
Sodium: 131 mmol/L — ABNORMAL LOW (ref 135–145)
Sodium: 130 mmol/L — ABNORMAL LOW (ref 135–145)

## 2020-03-07 LAB — CBG MONITORING, ED
Glucose-Capillary: 312 mg/dL — ABNORMAL HIGH (ref 70–99)
Glucose-Capillary: 346 mg/dL — ABNORMAL HIGH (ref 70–99)
Glucose-Capillary: 341 mg/dL — ABNORMAL HIGH (ref 70–99)
Glucose-Capillary: 313 mg/dL — ABNORMAL HIGH (ref 70–99)

## 2020-03-07 LAB — RESP PANEL BY RT-PCR (FLU A&B, COVID) ARPGX2
Influenza A by PCR: NEGATIVE
Influenza B by PCR: NEGATIVE
SARS Coronavirus 2 by RT PCR: NEGATIVE

## 2020-03-07 IMAGING — CR DG CHEST 2V
2 series · 2 of 2 positions shown · non-contrast
Comparison: [DATE]

CLINICAL DATA: Shortness of breath

EXAM:
CHEST - 2 VIEW

[chest pa]
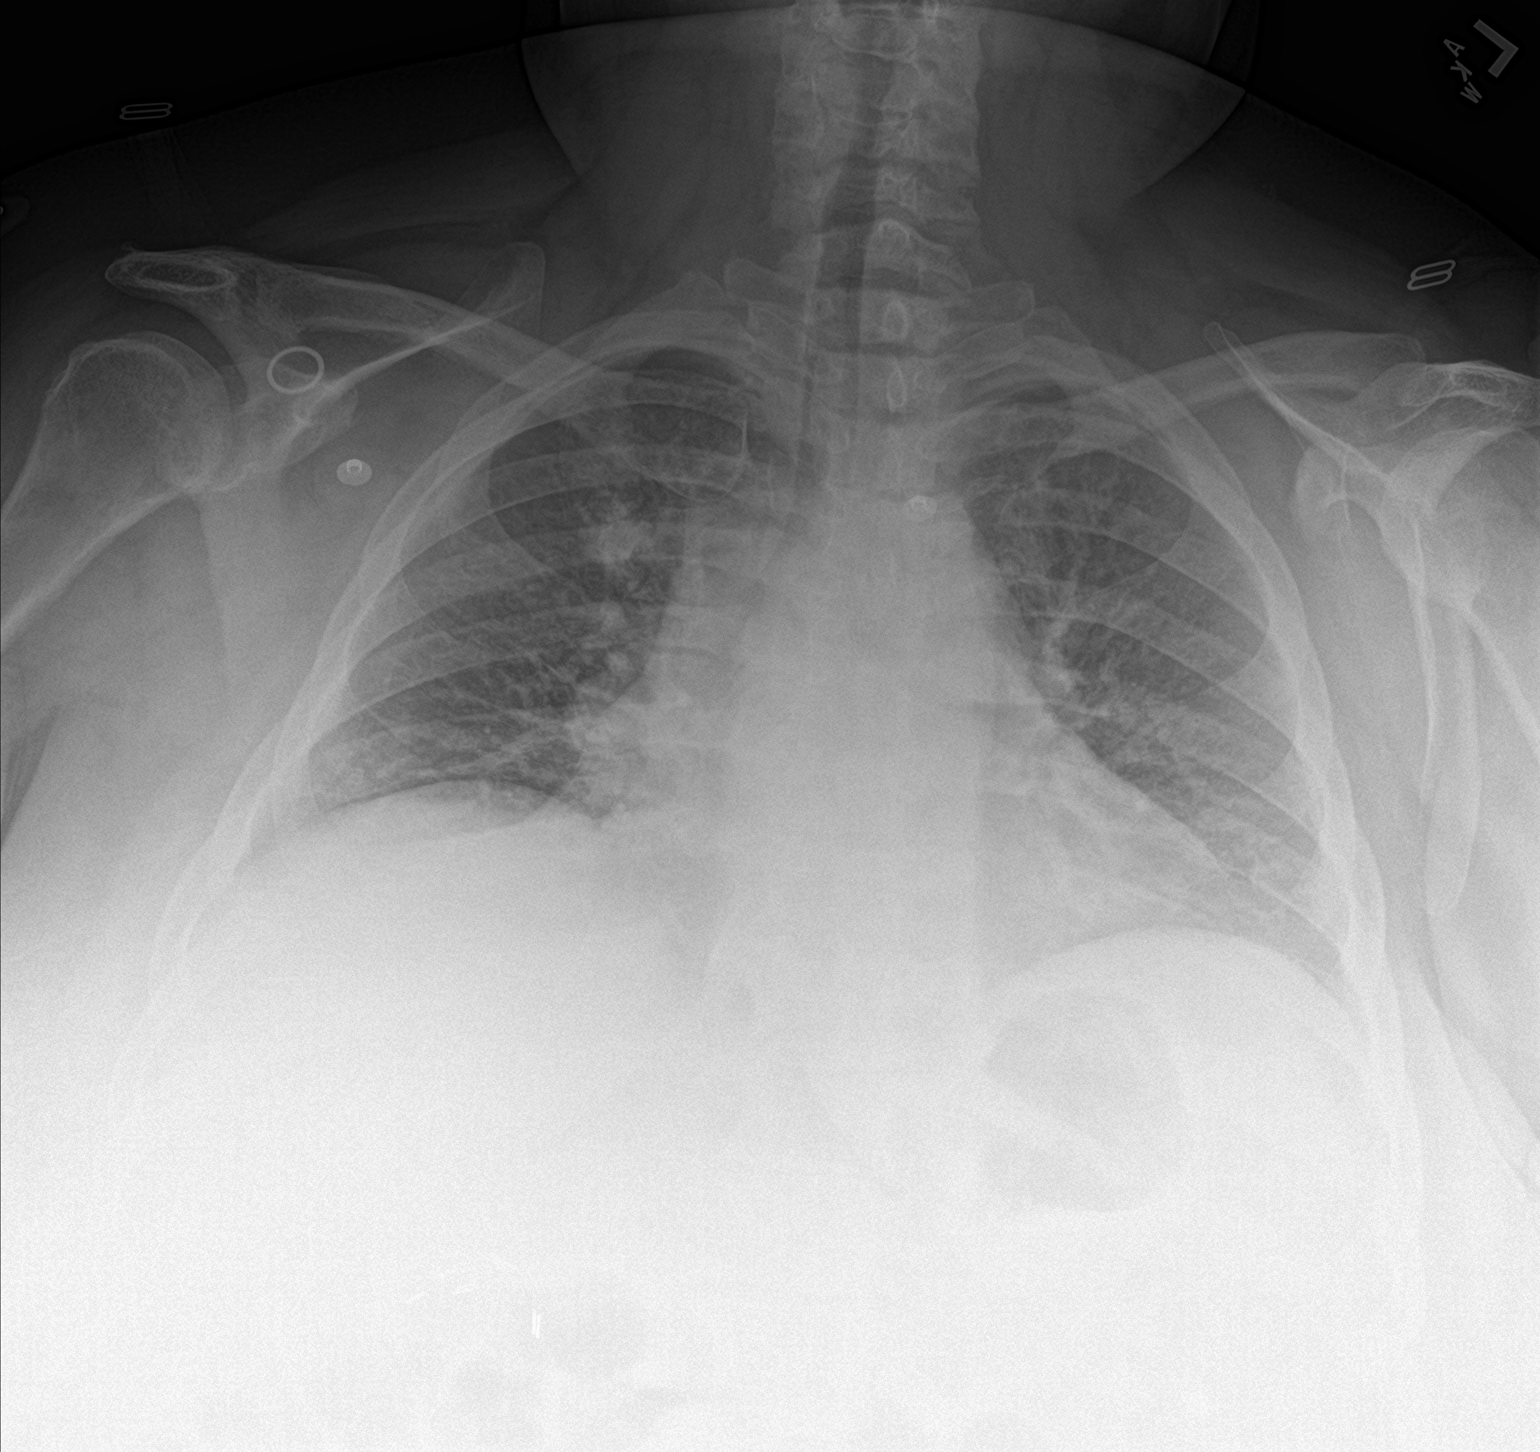

[chest lat]
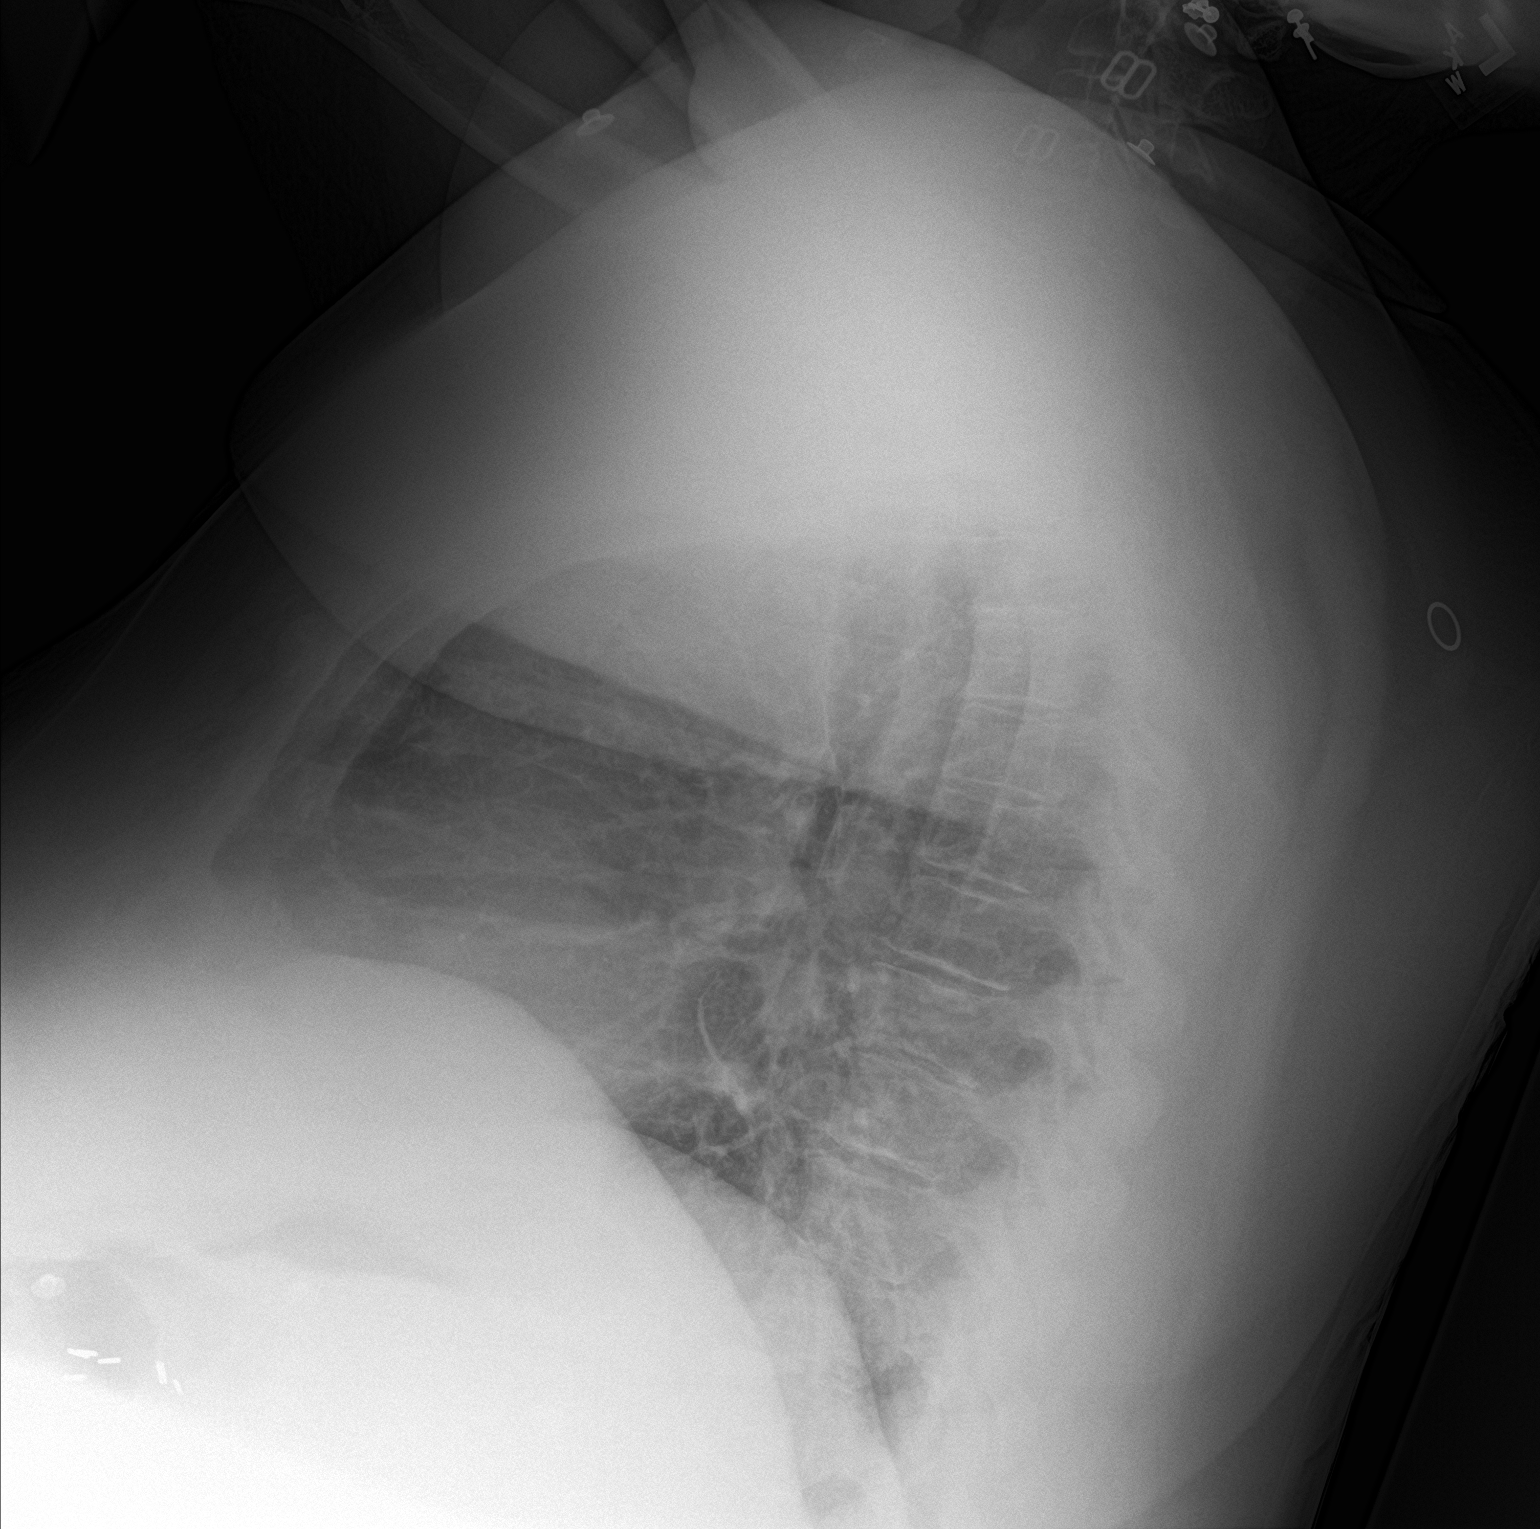

[2 of 2 positions shown; findings below may reference images not displayed]

FINDINGS: Shallow lung inflation with mild interstitial opacity, unchanged. No
focal consolidation. Normal pleural spaces and cardiomediastinal
contours.
IMPRESSION: Shallow lung inflation without focal airspace disease.

## 2020-03-07 MED ORDER — ATORVASTATIN CALCIUM 10 MG PO TABS
20.0000 mg | ORAL_TABLET | Freq: Every day | ORAL | Status: DC
  Administered 2020-03-08 – 2020-03-12 (×5): 20 mg via ORAL
  Filled 2020-03-07 (×5): qty 2

## 2020-03-07 MED ORDER — LEVETIRACETAM 500 MG PO TABS
1500.0000 mg | ORAL_TABLET | Freq: Once | ORAL | Status: AC
  Administered 2020-03-07: 1500 mg via ORAL
  Filled 2020-03-07: qty 3

## 2020-03-07 MED ORDER — ACETAMINOPHEN 325 MG PO TABS
650.0000 mg | ORAL_TABLET | Freq: Four times a day (QID) | ORAL | Status: DC | PRN
  Administered 2020-03-10 – 2020-03-12 (×2): 650 mg via ORAL
  Filled 2020-03-07 (×2): qty 2

## 2020-03-07 MED ORDER — SODIUM CHLORIDE 0.9 % IV BOLUS
1000.0000 mL | Freq: Once | INTRAVENOUS | Status: AC
  Administered 2020-03-07: 1000 mL via INTRAVENOUS

## 2020-03-07 MED ORDER — INSULIN ASPART 100 UNIT/ML ~~LOC~~ SOLN
0.0000 [IU] | Freq: Three times a day (TID) | SUBCUTANEOUS | Status: DC
  Administered 2020-03-08 (×2): 15 [IU] via SUBCUTANEOUS
  Administered 2020-03-08 – 2020-03-09 (×2): 11 [IU] via SUBCUTANEOUS
  Administered 2020-03-09: 15 [IU] via SUBCUTANEOUS
  Administered 2020-03-09: 8 [IU] via SUBCUTANEOUS
  Administered 2020-03-10: 15 [IU] via SUBCUTANEOUS
  Administered 2020-03-10: 11 [IU] via SUBCUTANEOUS
  Administered 2020-03-10: 15 [IU] via SUBCUTANEOUS
  Administered 2020-03-11 (×3): 8 [IU] via SUBCUTANEOUS
  Administered 2020-03-12 (×2): 5 [IU] via SUBCUTANEOUS

## 2020-03-07 MED ORDER — ONDANSETRON HCL 4 MG/2ML IJ SOLN: 4.0000 mg | Freq: Four times a day (QID) | INTRAMUSCULAR | Status: DC | PRN

## 2020-03-07 MED ORDER — HYDROXYZINE HCL 10 MG PO TABS
25.0000 mg | ORAL_TABLET | Freq: Three times a day (TID) | ORAL | Status: DC | PRN
  Administered 2020-03-08: 25 mg via ORAL
  Filled 2020-03-07: qty 3

## 2020-03-07 MED ORDER — SODIUM CHLORIDE 0.9 % IV SOLN: INTRAVENOUS | Status: DC

## 2020-03-07 MED ORDER — LEVETIRACETAM 750 MG PO TABS
750.0000 mg | ORAL_TABLET | Freq: Two times a day (BID) | ORAL | Status: DC
  Administered 2020-03-08 – 2020-03-12 (×9): 750 mg via ORAL
  Filled 2020-03-07 (×10): qty 1

## 2020-03-07 MED ORDER — SENNOSIDES-DOCUSATE SODIUM 8.6-50 MG PO TABS: 1.0000 | ORAL_TABLET | Freq: Every evening | ORAL | Status: DC | PRN

## 2020-03-07 MED ORDER — GABAPENTIN 600 MG PO TABS
300.0000 mg | ORAL_TABLET | Freq: Two times a day (BID) | ORAL | Status: DC
  Administered 2020-03-08 – 2020-03-12 (×9): 300 mg via ORAL
  Filled 2020-03-07 (×9): qty 1

## 2020-03-07 MED ORDER — INSULIN GLARGINE 100 UNIT/ML ~~LOC~~ SOLN: 40.0000 [IU] | Freq: Every day | SUBCUTANEOUS | Status: DC

## 2020-03-07 MED ORDER — ARIPIPRAZOLE 5 MG PO TABS
5.0000 mg | ORAL_TABLET | Freq: Every day | ORAL | Status: DC
  Administered 2020-03-08 – 2020-03-12 (×5): 5 mg via ORAL
  Filled 2020-03-07 (×5): qty 1

## 2020-03-07 MED ORDER — SODIUM CHLORIDE 0.9 % IV SOLN
1.0000 g | INTRAVENOUS | Status: DC
  Administered 2020-03-08 – 2020-03-09 (×3): 1 g via INTRAVENOUS
  Filled 2020-03-07 (×3): qty 10

## 2020-03-07 MED ORDER — OXYCODONE HCL 5 MG PO TABS
10.0000 mg | ORAL_TABLET | Freq: Three times a day (TID) | ORAL | Status: DC | PRN
  Administered 2020-03-07 – 2020-03-11 (×9): 10 mg via ORAL
  Filled 2020-03-07 (×9): qty 2

## 2020-03-07 MED ORDER — LIDOCAINE 5 % EX PTCH
4.0000 | MEDICATED_PATCH | CUTANEOUS | Status: DC
  Administered 2020-03-08 – 2020-03-09 (×3): 4 via TRANSDERMAL
  Administered 2020-03-11 – 2020-03-12 (×2): 2 via TRANSDERMAL
  Filled 2020-03-07 (×6): qty 4

## 2020-03-07 MED ORDER — ONDANSETRON HCL 4 MG PO TABS: 4.0000 mg | ORAL_TABLET | Freq: Four times a day (QID) | ORAL | Status: DC | PRN

## 2020-03-07 MED ORDER — PANTOPRAZOLE SODIUM 40 MG PO TBEC
40.0000 mg | DELAYED_RELEASE_TABLET | Freq: Every day | ORAL | Status: DC
  Administered 2020-03-08 – 2020-03-12 (×5): 40 mg via ORAL
  Filled 2020-03-07 (×5): qty 1

## 2020-03-07 MED ORDER — INSULIN GLARGINE 100 UNIT/ML ~~LOC~~ SOLN
40.0000 [IU] | Freq: Every day | SUBCUTANEOUS | Status: DC
  Administered 2020-03-07: 40 [IU] via SUBCUTANEOUS
  Filled 2020-03-07 (×4): qty 0.4

## 2020-03-07 MED ORDER — AMITRIPTYLINE HCL 50 MG PO TABS
50.0000 mg | ORAL_TABLET | Freq: Every day | ORAL | Status: DC
  Administered 2020-03-08 – 2020-03-11 (×5): 50 mg via ORAL
  Filled 2020-03-07 (×6): qty 1

## 2020-03-07 MED ORDER — TOPIRAMATE 100 MG PO TABS
200.0000 mg | ORAL_TABLET | Freq: Two times a day (BID) | ORAL | Status: DC
  Administered 2020-03-08 – 2020-03-12 (×10): 200 mg via ORAL
  Filled 2020-03-07 (×3): qty 8
  Filled 2020-03-07 (×10): qty 2
  Filled 2020-03-07: qty 8

## 2020-03-07 MED ORDER — ONDANSETRON HCL 4 MG/2ML IJ SOLN
4.0000 mg | Freq: Four times a day (QID) | INTRAMUSCULAR | Status: DC | PRN
  Administered 2020-03-07: 4 mg via INTRAVENOUS
  Filled 2020-03-07: qty 2

## 2020-03-07 MED ORDER — ACETAMINOPHEN 650 MG RE SUPP: 650.0000 mg | Freq: Four times a day (QID) | RECTAL | Status: DC | PRN

## 2020-03-07 MED ORDER — BACLOFEN 5 MG HALF TABLET
5.0000 mg | ORAL_TABLET | Freq: Two times a day (BID) | ORAL | Status: DC
  Administered 2020-03-08 – 2020-03-12 (×9): 5 mg via ORAL
  Filled 2020-03-07 (×10): qty 1

## 2020-03-07 MED ORDER — SENNA 8.6 MG PO TABS
8.6000 mg | ORAL_TABLET | Freq: Every day | ORAL | Status: DC | PRN
  Administered 2020-03-11: 8.6 mg via ORAL
  Filled 2020-03-07: qty 1

## 2020-03-07 MED ORDER — INSULIN ASPART 100 UNIT/ML ~~LOC~~ SOLN
0.0000 [IU] | Freq: Every day | SUBCUTANEOUS | Status: DC
  Administered 2020-03-07: 4 [IU] via SUBCUTANEOUS
  Administered 2020-03-08 – 2020-03-09 (×2): 5 [IU] via SUBCUTANEOUS
  Administered 2020-03-10: 3 [IU] via SUBCUTANEOUS
  Administered 2020-03-11: 4 [IU] via SUBCUTANEOUS

## 2020-03-07 MED ORDER — LEVOTHYROXINE SODIUM 75 MCG PO TABS
150.0000 ug | ORAL_TABLET | Freq: Every day | ORAL | Status: DC
  Administered 2020-03-08 – 2020-03-12 (×5): 150 ug via ORAL
  Filled 2020-03-07 (×6): qty 2

## 2020-03-07 MED ORDER — SODIUM CHLORIDE 0.9 % IV SOLN: 1.0000 g | Freq: Once | INTRAVENOUS | Status: DC

## 2020-03-07 MED ORDER — ENOXAPARIN SODIUM 40 MG/0.4ML ~~LOC~~ SOLN
40.0000 mg | SUBCUTANEOUS | Status: DC
  Administered 2020-03-08 – 2020-03-09 (×3): 40 mg via SUBCUTANEOUS
  Filled 2020-03-07 (×4): qty 0.4

## 2020-03-07 MED ORDER — FENTANYL 50 MCG/HR TD PT72
1.0000 | MEDICATED_PATCH | TRANSDERMAL | Status: DC
  Administered 2020-03-08 – 2020-03-11 (×2): 1 via TRANSDERMAL
  Filled 2020-03-07 (×2): qty 1

## 2020-03-07 MED ORDER — CIPROFLOXACIN HCL 500 MG PO TABS
500.0000 mg | ORAL_TABLET | Freq: Once | ORAL | Status: AC
  Administered 2020-03-07: 500 mg via ORAL
  Filled 2020-03-07: qty 1

## 2020-03-07 NOTE — Progress Notes (Signed)
Inpatient Diabetes Program Recommendations  AACE/ADA: New Consensus Statement on Inpatient Glycemic Control (2015)  Target Ranges:  Prepandial:   less than 140 mg/dL      Peak postprandial:   less than 180 mg/dL (1-2 hours)      Critically ill patients:  140 - 180 mg/dL   Lab Results  Component Value Date   GLUCAP 346 (H) 03/07/2020   HGBA1C 10.9 (H) 03/25/2019    Review of Glycemic Control Results for [name], [name] (MRN [id number]) as of 03/07/2020 10:37  Ref. Range 03/07/2020 02:32 03/07/2020 07:33  Glucose-Capillary Latest Ref Range: 70 - 99 mg/dL 312 (H) 346 (H)  Diabetes history: Type 2 DM Outpatient Diabetes medications: Lantus 95 units QHS, Novolog 95 units BID, Januvia 50 mg QD, Trulicity 1.5 mg qwk Current orders for Inpatient glycemic control: none  Inpatient Diabetes Program Recommendations:    If patient to be admitted, consider:   -Novolog 0-15 units TID & HS -A1C as last one from 08/2019 was 7.1% (per Dr Almetta Lovely note, outpt endocrinology) -Lantus 40 units QD  Thanks, Bronson Curb, MSN, RNC-OB Diabetes Coordinator [phone] (8a-5p)

## 2020-03-07 NOTE — ED Triage Notes (Signed)
Pt transported from home by EMS with c/o hyperglycemia in the 300's, feeling "shaky", pt called EMS earlier tonight and chose not to be transported after BS came down with insulin administration, later BS increased again with general malaise.  IV est, 500cc NS bolus given by EMS

## 2020-03-07 NOTE — ED Provider Notes (Signed)
[facility] EMERGENCY DEPARTMENT Provider Note   CSN: [id number] Arrival date & time: 03/07/20  0157     History Chief Complaint  Patient presents with  . Hyperglycemia    [name] is a 49 y.o. female.  Patient with history of diabetes, seizure disorder, chronic pain presents the emergency department today for complaint of fatigue, increased jerkiness and shakiness, burning with urination, and elevated blood sugar.  Patient was seen after a prolonged ED wait time.  Patient states that over the past several days she has been sleeping a lot more than usual and that her blood sugars have been running in the 3-400 range which is higher than she normally is.  She has had a fever to 101-102 F at home which she treats with over-the-counter medications.  She has had burning with urination.  Patient states that her doctor placed her on an antibiotic, amoxicillin, for UTI which she has been taking (however chart the notes that she requested antibiotic for URI symptoms in epic recently).  Patient also had increased jerking and shaking.  Typically Keppra will make this better.  She reports being very anxious about breakthrough seizure as she had one about a week ago.  She states that she takes her Keppra regularly however did not take this morning because of being in the emergency department.  Chronic pain in back -- no new Keppra. Also reports SOB with exertion without CP. This has been worse over the past 2-3 days. No fever, cough, worsening swelling.         Past Medical History:  Diagnosis Date  . Acute renal failure (ARF) ([place]) 09/02/2012  . Anemia   . Anxiety    panic attacks  . Back pain   . Blood transfusion   . Chronic heel ulcer ([place])   . Chronic kidney disease   . Chronic pain syndrome   . Depression   . Diabetes mellitus    type II   . DJD (degenerative joint disease)   . Dysrhythmia    pt unsure what this was  . Fibromyalgia   . GERD  (gastroesophageal reflux disease)   . H/O: Bell's palsy 2011  . Heart murmur    "slight one"  . History of kidney stones   . History of MRSA infection OF ULCER  . Hypertension    no longer taken since 10-11 months per patient at preop phone call of 10/13/2017   . Hypothyroidism   . Hypoventilation associated with obesity syndrome ([place])   . Migraine   . Non-healing non-surgical wound    Right hip, has Wound vac to hip.  Started as a skin tear.  . Peripheral vascular disease ([place])   . PONV (postoperative nausea and vomiting)    can be slow to wake up after surgery  . Right foot drop   . Shortness of breath    with Activity  . Sleep apnea    "study shows not bad enough for CPAP.", pt denies  . Umbilical hernia   . URI (upper respiratory infection) 03/05/2011    Patient Active Problem List   Diagnosis Date Noted  . History of 2019 novel coronavirus disease (COVID-19) 03/25/2019  . Depression with anxiety 03/25/2019  . Chest pain 03/24/2019  . Postoperative abscess 10/27/2017  . Umbilical hernia, incarcerated, s/p repair 10/16/2017 10/16/2017  . Chronic pain of both knees 04/23/2017  . Unilateral primary osteoarthritis, left knee 10/24/2016  . Unilateral primary osteoarthritis, right knee 10/24/2016  .  Abnormal mammogram with microcalcification-Right inner lower quadrant 07/21/2013  . Oliguria and anuria 09/02/2012  . Constipation 03/02/2011  . Lethargy 03/01/2011  . Heel ulcer due to DM ([place]) 02/19/2011  . Wound, open, hip or thigh 02/19/2011  . Healing pressure ulcer stage III ([place]) 02/18/2011  . Morbidly obese ([place]) 02/18/2011  . Diabetes mellitus type 2 with complications, uncontrolled ([place]) 02/18/2011  . Sleep apnea 02/18/2011  . Hypothyroidism 02/18/2011  . Chronic pain 02/18/2011  . Acute lymphangitis 02/18/2011  . Cellulitis of foot 02/18/2011    Past Surgical History:  Procedure Laterality Date  . BACK SURGERY     for lumbar disc disease X2  . [place]   Tumor removed- has steel plate  . BRAIN SURGERY      Plating due to soft spot closing too early- age 25  . BREAST LUMPECTOMY WITH NEEDLE LOCALIZATION Right 08/23/2013   Procedure: RIGHT BREAST LUMPECTOMY WITH NEEDLE LOCALIZATION;  Surgeon: Odis Hollingshead, MD;  Location: [facility];  Service: General;  Laterality: Right;  . CHOLECYSTECTOMY  1984  . EXCISION UMBILICAL NODULE N/A 7/61/6073   Procedure: EXCISION OF UMBILICUS;  Surgeon: Coralie Keens, MD;  Location: [facility];  Service: General;  Laterality: N/A;  . EYE SURGERY     eye lift  . I & D EXTREMITY  04/02/2011   Procedure: IRRIGATION AND DEBRIDEMENT EXTREMITY;  Surgeon: Mcarthur Rossetti;  Location: [facility];  Service: Orthopedics;  Laterality: Right;  I&D right heel ulcer, placement of A-cell graft  . INCISION AND DRAINAGE ABSCESS N/A 10/28/2017   Procedure: INCISION AND DRAINAGE UMBILICAL HERNIA ABSCESS;  Surgeon: Ralene Ok, MD;  Location: [facility];  Service: General;  Laterality: N/A;  . INCISION AND DRAINAGE OF WOUND  08/29/2011   Procedure: IRRIGATION AND DEBRIDEMENT WOUND;  Surgeon: Theodoro Kos, DO;  Location: [facility];  Service: Plastics;  Laterality: Right;  . INSERTION OF MESH N/A 10/16/2017   Procedure: INSERTION OF MESH;  Surgeon: Coralie Keens, MD;  Location: [facility];  Service: General;  Laterality: N/A;  . LITHOTRIPSY     2007ish  . right elbow    . UMBILICAL HERNIA REPAIR N/A 10/16/2017   Procedure: UMBILICAL HERNIA REPAIR WITH MESH;  Surgeon: Coralie Keens, MD;  Location: [facility];  Service: General;  Laterality: N/A;     OB History   No obstetric history on file.     Family History  Problem Relation Age of Onset  . Diabetes type II Father   . Heart attack Father   . Peripheral vascular disease Father   . Diabetes type II Mother   . Anesthesia problems Mother   . Heart attack Mother   . Peripheral vascular disease Mother   . Hypertension Mother     Social History   Tobacco Use  . Smoking status:  Never Smoker  . Smokeless tobacco: Never Used  Vaping Use  . Vaping Use: Never used  Substance Use Topics  . Alcohol use: No  . Drug use: No    Home Medications Prior to Admission medications   Medication Sig Start Date End Date Taking? Authorizing Provider  acetaminophen (TYLENOL) 500 MG tablet Take 1,000 mg by mouth 2 (two) times daily as needed for moderate pain or headache.    [provider]  ARIPiprazole (ABILIFY) 5 MG tablet Take 5 mg by mouth daily. 03/04/19   [provider]  atorvastatin (LIPITOR) 20 MG tablet Take 20 mg by mouth daily.    [provider]  Biotin 10000 MCG TABS Take 10,000 mcg by mouth daily.    [provider]  carisoprodol (SOMA) 350 MG tablet Take 350 mg by mouth 2 (two) times daily.     [provider]  cetirizine (ZYRTEC) 10 MG tablet Take 10 mg by mouth daily as needed for allergies.    [provider]  Cyanocobalamin (VITAMIN B-12 IJ) Inject 1 each as directed every 30 (thirty) days.     [provider]  Diclofenac Sodium 2 % SOLN Place 2-4 g onto the skin 4 (four) times daily as needed. 10/08/18   Mcarthur Rossetti, MD  DULoxetine (CYMBALTA) 60 MG capsule Take 60-120 mg by mouth See admin instructions. 120 mg in the morning, 60 mg in the evening    [provider]  eletriptan (RELPAX) 40 MG tablet Take 40 mg by mouth as needed for migraine or headache. May repeat in 2 hours if headache persists or recurs.    [provider]  esomeprazole (NEXIUM) 40 MG capsule Take 40 mg by mouth daily before breakfast. Name Brand Only    [provider]  fentaNYL (DURAGESIC) 50 MCG/HR Place 50 mcg onto the skin every 3 (three) days.     [provider]  furosemide (LASIX) 20 MG tablet Take 20 mg by mouth daily as needed for fluid.     [provider]  gabapentin (NEURONTIN) 600 MG tablet Take 1,200 mg by mouth 3 (three) times daily.    [provider]   hydrOXYzine (VISTARIL) 25 MG capsule Take 25 mg by mouth 3 (three) times daily as needed for anxiety.    [provider]  insulin aspart (NOVOLOG) 100 UNIT/ML FlexPen Inject 95 Units into the skin 2 (two) times daily.    [provider]  insulin glargine (LANTUS) 100 UNIT/ML injection Inject 0.95 mLs (95 Units total) into the skin at bedtime. 03/25/19   Radene Gunning, NP  levETIRAcetam (KEPPRA) 750 MG tablet Take 1,500 mg by mouth 2 (two) times daily.  03/04/19   [provider]  levothyroxine (SYNTHROID) 150 MCG tablet Take 125 mcg by mouth daily.     [provider]  lidocaine (LIDODERM) 5 % Place 1-3 patches onto the skin daily. ONE PATCH ON EACH KNEE AND ONE PATCH ON THE LOWER BACK 05/30/17   [provider]  milk thistle 175 MG tablet Take 350 mg by mouth daily.    [provider]  NARCAN 4 MG/0.1ML LIQD nasal spray kit PLACE 1 SPRAY IN 1 NOSTRIL FOR ACCIDENTAL OVERDOSE. MAY REPEAT Q 2 TO 3 MIN IF PATIENT UNRESPONSIVE. 10/22/17   [provider]  nystatin (MYCOSTATIN/NYSTOP) 100000 UNIT/GM POWD Apply 1 g topically daily as needed (blemishes).     [provider]  Oxycodone HCl 10 MG TABS Take 10 mg by mouth 2 (two) times daily.     [provider]  PE-Triprolidine-DM-GG-APAP [facility] CLEAR & COOL DAY/NIGHT PO) Take 1 tablet by mouth every 6 (six) hours as needed (cold symptoms).    [provider]  Phenyleph-Doxylamine-DM-APAP (NYQUIL SEVERE COLD/FLU) 5-6.25-10-325 MG/15ML LIQD Take 30 mLs by mouth every 8 (eight) hours as needed (cold symptoms).    [provider]  promethazine (PHENERGAN) 25 MG tablet Take 25 mg by mouth every 6 (six) hours as needed for nausea or vomiting.    [provider]  senna (SENOKOT) 8.6 MG TABS tablet Take 8.6 mg by mouth daily as needed for mild constipation.  [provider]  sitaGLIPtin (JANUVIA) 50 MG tablet Take 50 mg by mouth daily.    [provider]  topiramate (TOPAMAX) 200 MG tablet Take 200 mg by mouth 2 (two) times daily.    [provider]  TRULICITY 1.5 KW/4.0XB SOPN 1.5 mg by Subconjunctival route every Tuesday.  11/24/18   [provider]    Allergies    Penicillins, Clindamycin/lincomycin, Nsaids, Sulfa antibiotics, and Versed [midazolam]  Review of Systems   Review of Systems  Constitutional: Positive for chills and fever. Negative for diaphoresis.  Eyes: Negative for redness.  Respiratory: Positive for shortness of breath. Negative for cough.   Cardiovascular: Positive for chest pain. Negative for palpitations and leg swelling.  Gastrointestinal: Negative for abdominal pain, nausea and vomiting.  Genitourinary: Positive for dysuria. Negative for flank pain and hematuria.  Musculoskeletal: Negative for back pain and neck pain.  Skin: Negative for rash.  Neurological: Positive for tremors. Negative for seizures (recent breakthrough, none recently), syncope and light-headedness.  Psychiatric/Behavioral: The patient is not nervous/anxious.     Physical Exam Updated Vital Signs BP (!) 115/91 (BP Location: Right Arm)   Pulse (!) 101   Temp 98 F (36.7 C)   Resp 14   SpO2 92%   Physical Exam Vitals and nursing note reviewed.  Constitutional:      General: She is not in acute distress.    Appearance: She is well-developed.  HENT:     Head: Normocephalic and atraumatic.     Right Ear: External ear normal.     Left Ear: External ear normal.     Nose: Nose normal.  Eyes:     Conjunctiva/sclera: Conjunctivae normal.  Cardiovascular:     Rate and Rhythm: Regular rhythm. Tachycardia present.     Heart sounds: No murmur heard.     Comments: HR 100 Pulmonary:     Effort: No respiratory distress.     Breath sounds: No wheezing, rhonchi or rales.  Abdominal:     Palpations: Abdomen is soft.     Tenderness: There is no abdominal tenderness. There is no guarding or rebound.   Musculoskeletal:     Cervical back: Normal range of motion and neck supple.     Right lower leg: No edema.     Left lower leg: No edema.  Skin:    General: Skin is warm and dry.     Findings: No rash.  Neurological:     General: No focal deficit present.     Mental Status: She is alert. Mental status is at baseline.     Motor: No weakness.  Psychiatric:        Mood and Affect: Mood normal.     ED Results / Procedures / Treatments   Labs (all labs ordered are listed, but only abnormal results are displayed) Labs Reviewed  BASIC METABOLIC PANEL - Abnormal; Notable for the following components:      Result Value   Sodium 131 (*)    CO2 19 (*)    Glucose, Bld 304 (*)    BUN 25 (*)    Creatinine, Ser 1.95 (*)    GFR, Estimated 31 (*)    All other components within normal limits  CBC - Abnormal; Notable for the following components:   WBC 11.6 (*)    All other components within normal limits  URINALYSIS, ROUTINE W REFLEX MICROSCOPIC - Abnormal; Notable for the following components:   Color, Urine AMBER (*)    APPearance  CLOUDY (*)    Glucose, UA >=500 (*)    Bilirubin Urine SMALL (*)    Protein, ur 30 (*)    Leukocytes,Ua SMALL (*)    Bacteria, UA RARE (*)    All other components within normal limits  BASIC METABOLIC PANEL - Abnormal; Notable for the following components:   Sodium 130 (*)    Chloride 97 (*)    CO2 19 (*)    Glucose, Bld 388 (*)    BUN 35 (*)    Creatinine, Ser 2.27 (*)    GFR, Estimated 26 (*)    All other components within normal limits  CBG MONITORING, ED - Abnormal; Notable for the following components:   Glucose-Capillary 312 (*)    All other components within normal limits  CBG MONITORING, ED - Abnormal; Notable for the following components:   Glucose-Capillary 346 (*)    All other components within normal limits  CBG MONITORING, ED - Abnormal; Notable for the following components:   Glucose-Capillary 341 (*)    All other components within  normal limits  RESP PANEL BY RT-PCR (FLU A&B, COVID) [id number]  URINE CULTURE  HEMOGLOBIN A1C  I-STAT BETA HCG BLOOD, ED ([facility], [facility], [facility] ONLY)  I-STAT CHEM 8, ED    EKG None  Radiology DG Chest 2 View  Result Date: 03/07/2020 CLINICAL DATA:  Shortness of breath EXAM: CHEST - 2 VIEW COMPARISON:  03/24/2019 FINDINGS: Shallow lung inflation with mild interstitial opacity, unchanged. No focal consolidation. Normal pleural spaces and cardiomediastinal contours. IMPRESSION: Shallow lung inflation without focal airspace disease. Electronically Signed   By: Deatra Robinson M.D.   On: 03/07/2020 03:30    Procedures Procedures (including critical care time)  Medications Ordered in ED Medications  sodium chloride 0.9 % bolus 1,000 mL (1,000 mLs Intravenous New Bag/Given 03/07/20 1705)  levETIRAcetam (KEPPRA) tablet 1,500 mg (1,500 mg Oral Given 03/07/20 1705)  ciprofloxacin (CIPRO) tablet 500 mg (500 mg Oral Given 03/07/20 1705)    ED Course  I have reviewed the triage vital signs and the nursing notes.  Pertinent labs & imaging results that were available during my care of the patient were reviewed by me and considered in my medical decision making (see chart for details).  Patient seen and examined. Presents with multiple complaints.  Labs ordered last night reviewed.  Patient has AKI, elevated blood sugar.  UA is not clean-catch and difficult to interpret.  Vital signs reviewed and are as follows: BP (!) 115/91 (BP Location: Right Arm)   Pulse (!) 101   Temp 98 F (36.7 C)   Resp 14   SpO2 92%   High blood sugar: No signs of DKA however will recheck BMP given wait time. IV fluid bolus ordered. May be exacerbated by possible UTI.    Dysuria: Patient states ongoing treatment with amoxicillin, however per notes this might be due to URI symptoms.  She has been taking this intermittently and only appears to have taken 3 or so days worth in the past 9 days.  Patient is allergic to  cephalosporins, sulfa drugs.  Would prefer not to use nitrofurantoin given recent kidney function.  Will change antibiotic to Cipro.  Patient afebrile here, low concern for pyelonephritis.  No flank pain or vomiting.  Shortness of breath: No DVT symptoms.  Chest x-ray is clear.  No pneumonia symptoms.  Sounds like a chronic problem for the patient.  Likely exacerbated by hypoventilation syndrome.  Pulse ox here mostly in the low to  mid 22s.  AKI: We will give IV hydration, recheck BMP.  Seizure disorder: Seizure and transient confusion about 9 days ago, continue Keppra.   Given these problems, patient does not appear toxic.  Will reassess patient.  6:15 PM Pt rechecked. Has received fluid bolus.   Creatinine 1.95 >> 2.27. Baseline ~ 1.   Discussed overnight admission with patient. I feel this is appropriate 2/2 worsening kidney function in setting of probable UTI.   7:02 PM Discussed with Dr. Myna Hidalgo who will see.     MDM Rules/Calculators/A&P                          Admit.   Final Clinical Impression(s) / ED Diagnoses Final diagnoses:  Acute kidney injury [facility])  Hyperglycemia  Dysuria    Rx / DC Orders ED Discharge Orders    None       Carlisle Cater, Hershal Coria 03/07/20 1903    Truddie Hidden, MD 03/07/20 [phone]

## 2020-03-07 NOTE — ED Triage Notes (Signed)
Pt appears tremulous with occasional twitching, slow responses, tearful at times, repetitive responses, pt states she is on Keppra, last seizure 1 week ago. Pt c/o pain to low back and knees,  Pt states she did not take her night meds because she feel asleep.

## 2020-03-07 NOTE — H&P (Signed)
History and Physical    [name] MPN:[id number] DOB: 07/17/1970 DOA: 03/07/2020  PCP: Pecolia Ades, NP   Patient coming from: Home   Chief Complaint: Hyperglycemia, dysuria    HPI: [name] is a 49 y.o. female with medical history significant for depression, anxiety, seizures, chronic pain, insulin-dependent diabetes mellitus, and hypertension, now presenting to the emergency department for evaluation of hyperglycemia, dysuria, and general malaise.  Patient reports that she has been unable to control her blood sugar at home recently, noting CBGs in the 300-400 range, and is also been experiencing dysuria and general malaise.  She denies any flank pain, nausea, or vomiting but reports that she has had fever at home.  She had some amoxicillin at home from a prior URI that she has been taking for the urinary symptoms.  She reports a seizure 1 week ago, reports that she is typically adherent with her medications but did not take them yesterday.    ED Course: Upon arrival to the ED, patient is found to be afebrile, saturating low 90s on room air, and with stable blood pressure.  Chest x-ray is negative for acute findings.  Chemistry panel notable for glucose of 388, BUN 35, and creatinine 2.27, up from a baseline of roughly 1.  CBC features a mild leukocytosis.  Patient was given a liter of saline, Keppra, and ciprofloxacin in the ED.  She was seen by diabetic coordinator.  Review of Systems:  All other systems reviewed and apart from HPI, are negative.  Past Medical History:  Diagnosis Date  . Acute renal failure (ARF) ([place]) 09/02/2012  . Anemia   . Anxiety    panic attacks  . Back pain   . Blood transfusion   . Chronic heel ulcer ([place])   . Chronic kidney disease   . Chronic pain syndrome   . Depression   . Diabetes mellitus    type II   . DJD (degenerative joint disease)   . Dysrhythmia    pt unsure what this was  . Fibromyalgia   . GERD (gastroesophageal  reflux disease)   . H/O: Bell's palsy 2011  . Heart murmur    "slight one"  . History of kidney stones   . History of MRSA infection OF ULCER  . Hypertension    no longer taken since 10-11 months per patient at preop phone call of 10/13/2017   . Hypothyroidism   . Hypoventilation associated with obesity syndrome ([place])   . Migraine   . Non-healing non-surgical wound    Right hip, has Wound vac to hip.  Started as a skin tear.  . Peripheral vascular disease ([place])   . PONV (postoperative nausea and vomiting)    can be slow to wake up after surgery  . Right foot drop   . Shortness of breath    with Activity  . Sleep apnea    "study shows not bad enough for CPAP.", pt denies  . Umbilical hernia   . URI (upper respiratory infection) 03/05/2011    Past Surgical History:  Procedure Laterality Date  . BACK SURGERY     for lumbar disc disease X2  . 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 BRAIN SURGERY      Plating due to soft spot closing too early- age 18  . BREAST LUMPECTOMY WITH NEEDLE LOCALIZATION Right 08/23/2013   Procedure: RIGHT BREAST LUMPECTOMY WITH NEEDLE LOCALIZATION;  Surgeon: Odis Hollingshead, MD;  Location: [facility];  Service: General;  Laterality: Right;  . CHOLECYSTECTOMY  1984  . EXCISION UMBILICAL NODULE N/A 09/28/2374   Procedure: EXCISION OF UMBILICUS;  Surgeon: Coralie Keens, MD;  Location: [facility];  Service: General;  Laterality: N/A;  . EYE SURGERY     eye lift  . I & D EXTREMITY  04/02/2011   Procedure: IRRIGATION AND DEBRIDEMENT EXTREMITY;  Surgeon: Mcarthur Rossetti;  Location: [facility];  Service: Orthopedics;  Laterality: Right;  I&D right heel ulcer, placement of A-cell graft  . INCISION AND DRAINAGE ABSCESS N/A 10/28/2017   Procedure: INCISION AND DRAINAGE UMBILICAL HERNIA ABSCESS;  Surgeon: Ralene Ok, MD;  Location: [facility];  Service: General;  Laterality: N/A;  . INCISION AND DRAINAGE OF WOUND  08/29/2011   Procedure: IRRIGATION AND  DEBRIDEMENT WOUND;  Surgeon: Theodoro Kos, DO;  Location: [facility];  Service: Plastics;  Laterality: Right;  . INSERTION OF MESH N/A 10/16/2017   Procedure: INSERTION OF MESH;  Surgeon: Coralie Keens, MD;  Location: [facility];  Service: General;  Laterality: N/A;  . LITHOTRIPSY     2007ish  . right elbow    . UMBILICAL HERNIA REPAIR N/A 10/16/2017   Procedure: UMBILICAL HERNIA REPAIR WITH MESH;  Surgeon: Coralie Keens, MD;  Location: [facility];  Service: General;  Laterality: N/A;    Social History:   reports that she has never smoked. She has never used smokeless tobacco. She reports that she does not drink alcohol and does not use drugs.  Allergies  Allergen Reactions  . Penicillins Hives and Other (See Comments)    HAS PT DEVELOPED SEVERE RASH INVOLVING MUCUS MEMBRANES or SKIN NECROSIS: #  #  YES  #  # PATIENT HAS HAD A PCN REACTION THAT REQUIRED HOSPITALIZATION:  #  #  YES  #  #   Tolerates amoxicillin on multiple occasions per Dr. Darrel Hoover note 11/01/17.  TDD.  . Clindamycin/Lincomycin Hives, Dermatitis and Rash  . 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 Sulfa Antibiotics Hives  . Versed [Midazolam] Nausea And Vomiting    Pt had medication on October 16 2017 with no issues    Family History  Problem Relation Age of Onset  . Diabetes type II Father   . Heart attack Father   . Peripheral vascular disease Father   . Diabetes type II Mother   . Anesthesia problems Mother   . Heart attack Mother   . Peripheral vascular disease Mother   . Hypertension Mother      Prior to Admission medications   Medication Sig Start Date End Date Taking? Authorizing Provider  acetaminophen (TYLENOL) 500 MG tablet Take 1,000 mg by mouth 2 (two) times daily as needed for moderate pain or headache.   Yes [provider]  amitriptyline (ELAVIL) 25 MG tablet Take 50 mg by mouth at bedtime. 02/07/20  Yes [provider]  amoxicillin (AMOXIL)  500 MG capsule Take 1,000 mg by mouth 2 (two) times daily. 02/22/20  Yes [provider]  ARIPiprazole (ABILIFY) 5 MG tablet Take 5 mg by mouth daily. 03/04/19  Yes [provider]  atorvastatin (LIPITOR) 20 MG tablet Take 20 mg by mouth daily.   Yes [provider]  baclofen (LIORESAL) 10 MG tablet Take 10 mg by mouth 3 (three) times daily. 01/13/20  Yes [provider]  Biotin 10000 MCG TABS Take 10,000 mcg by mouth daily.   Yes [provider]  carisoprodol (SOMA) 350 MG tablet Take 350 mg by mouth 2 (two) times daily.   Yes [provider]  cetirizine (ZYRTEC) 10 MG tablet Take 10 mg by mouth daily as needed for allergies.   Yes [provider]  Cyanocobalamin (VITAMIN B-12 IJ) Inject 1 each as directed every 30 (thirty) days.   Yes [provider]  Diclofenac Sodium 2 % SOLN Place 2-4 g onto the skin 4 (four) times daily as needed. Patient taking differently: Place 4 g onto the skin 4 (four) times daily as needed (pain). 10/08/18  Yes Mcarthur Rossetti, MD  DULoxetine (CYMBALTA) 60 MG capsule Take 60 mg by mouth 2 (two) times daily.   Yes [provider]  eletriptan (RELPAX) 40 MG tablet Take 40 mg by mouth as needed for migraine or headache. May repeat in 2 hours if headache persists or recurs.   Yes [provider]  esomeprazole (NEXIUM) 40 MG capsule Take 40 mg by mouth daily before breakfast. Name Brand Only   Yes [provider]  fentaNYL (DURAGESIC) 50 MCG/HR Place 50 mcg onto the skin every 3 (three) days.   Yes [provider]  furosemide (LASIX) 20 MG tablet Take 20 mg by mouth daily as needed for fluid.    Yes [provider]  gabapentin (NEURONTIN) 600 MG tablet Take 1,200 mg by mouth 3 (three) times daily.   Yes [provider]  hydrOXYzine (VISTARIL) 25 MG capsule Take 25 mg by mouth 3 (three) times daily as needed for anxiety.   Yes [provider]  insulin aspart (NOVOLOG) 100 UNIT/ML FlexPen Inject 75 Units into the skin See admin instructions. Injects 75 units under the skin before breakfast; injects 25 units at lunch; injects 75 units at dinner time   Yes [provider]  insulin glargine (LANTUS) 100 UNIT/ML injection Inject 0.95 mLs (95 Units total) into the skin at bedtime. Patient taking differently: Inject 75 Units into the skin at bedtime. 03/25/19  Yes Black, Lezlie Octave, NP  levETIRAcetam (KEPPRA) 750 MG tablet Take 750 mg by mouth 2 (two) times daily. 03/04/19  Yes [provider]  levothyroxine (SYNTHROID) 150 MCG tablet Take 150 mcg by mouth daily.   Yes [provider]  lidocaine (LIDODERM) 5 % Place 4 patches onto the skin daily. 05/30/17  Yes [provider]  lisinopril (ZESTRIL) 20 MG tablet Take 20 mg by mouth daily. 03/02/20  Yes [provider]  NARCAN 4 MG/0.1ML LIQD nasal spray kit Place 4 mg into the nose daily as needed (opioid overdose). 10/22/17  Yes [provider]  nystatin (MYCOSTATIN/NYSTOP) 100000 UNIT/GM POWD Apply 1 g topically daily.   Yes [provider]  Oxycodone HCl 10 MG TABS Take 10 mg by mouth 4 (four) times daily.   Yes [provider]  PE-Triprolidine-DM-GG-APAP ([place] DAY/NIGHT PO) Take 1 tablet by mouth every 6 (six) hours as needed (cold symptoms).   Yes [provider]  Phenyleph-Doxylamine-DM-APAP (NYQUIL SEVERE COLD/FLU) 5-6.25-10-325 MG/15ML LIQD Take 30 mLs by mouth every 8 (eight) hours as needed (cold symptoms).   Yes [provider]  promethazine (PHENERGAN) 25 MG tablet Take 25 mg by mouth every 6 (six) hours as needed for nausea or vomiting.   Yes [provider]  senna (SENOKOT) 8.6 MG TABS tablet Take 8.6 mg by mouth daily as needed for mild constipation.    Yes [provider]  sitaGLIPtin (JANUVIA) 50 MG  tablet Take 50 mg by mouth daily.   Yes [provider]  topiramate (TOPAMAX) 200 MG tablet Take 200 mg by mouth 2 (two) times daily.   Yes [provider]  TRULICITY 1.5 DX/4.1OI SOPN Inject 1.5 mg into the skin every Thursday. 11/24/18  Yes [provider]    Physical Exam: Vitals:   03/07/20 2200 03/07/20 2215 03/07/20 2225 03/07/20 2230  BP: (!) 162/121 103/72 119/77 112/61  Pulse: 96 94 92 95  Resp: _0 Temp:   98.2 F (36.8 C)   TempSrc:   Oral   SpO2: 96% 91% 100% 90%  Weight:      Height:        Constitutional: NAD, calm  Eyes: PERTLA, lids and conjunctivae normal ENMT: Mucous membranes are moist. Posterior pharynx clear of any exudate or lesions.   Neck: normal, supple, no masses, no thyromegaly Respiratory: no wheezing, no crackles. No accessory muscle use.  Cardiovascular: S1 & S2 heard, regular rate and rhythm. No extremity edema.   Abdomen: No distension, no tenderness, soft. Bowel sounds active.  Musculoskeletal: no clubbing / cyanosis. No joint deformity upper and lower extremities.   Skin: no significant rashes, lesions, ulcers. Warm, dry, well-perfused. Neurologic: CN 2-12 grossly intact. Sensation intact. Resting tremor involving UEs. Moving all extremities.  Psychiatric: Alert and oriented to person, place, and situation. Pleasant and cooperative.    Labs and Imaging on Admission: I have personally reviewed following labs and imaging studies  CBC: Recent Labs  Lab 03/07/20 0223  WBC 11.6*  HGB 15.0  HCT 44.5  MCV 93.3  PLT 786   Basic Metabolic Panel: Recent Labs  Lab 03/07/20 0223 03/07/20 1628  NA 131* 130*  K 3.8 3.9  CL 100 97*  CO2 19* 19*  GLUCOSE 304* 388*  BUN 25* 35*  CREATININE 1.95* 2.27*  CALCIUM 9.0 9.0   GFR: Estimated Creatinine Clearance: 45.2 mL/min (A) (by C-G formula based on SCr of 2.27 mg/dL (H)). Liver Function Tests: No results for input(s): AST, ALT, ALKPHOS, BILITOT, PROT, ALBUMIN in the last 168 hours. No results for input(s):  LIPASE, AMYLASE in the last 168 hours. No results for input(s): AMMONIA in the last 168 hours. Coagulation Profile: No results for input(s): INR, PROTIME in the last 168 hours. Cardiac Enzymes: No results for input(s): CKTOTAL, CKMB, CKMBINDEX, TROPONINI in the last 168 hours. BNP (last 3 results) No results for input(s): PROBNP in the last 8760 hours. HbA1C: No results for input(s): HGBA1C in the last 72 hours. CBG: Recent Labs  Lab 03/07/20 0232 03/07/20 0733 03/07/20 1139 03/07/20 2027  GLUCAP 312* 346* 341* 313*   Lipid Profile: No results for input(s): CHOL, HDL, LDLCALC, TRIG, CHOLHDL, LDLDIRECT in the last 72 hours. Thyroid Function Tests: No results for input(s): TSH, T4TOTAL, FREET4, T3FREE, THYROIDAB in the last 72 hours. Anemia Panel: No results for input(s): VITAMINB12, FOLATE, FERRITIN, TIBC, IRON, RETICCTPCT in the last 72 hours. Urine analysis:    Component Value Date/Time   COLORURINE AMBER (A) 03/07/2020 0214   APPEARANCEUR CLOUDY (A) 03/07/2020 0214   LABSPEC 1.027 03/07/2020 0214   PHURINE 5.0 03/07/2020 0214   GLUCOSEU >=500 (A) 03/07/2020 0214   HGBUR NEGATIVE 03/07/2020 0214   BILIRUBINUR SMALL (A) 03/07/2020 0214   KETONESUR NEGATIVE 03/07/2020 0214   PROTEINUR 30 (A) 03/07/2020 0214   UROBILINOGEN 0.2 09/05/2012 1141   NITRITE NEGATIVE 03/07/2020 0214   LEUKOCYTESUR SMALL (A) 03/07/2020 0214   Sepsis Labs: @  LABRCNTIP(procalcitonin:4,lacticidven:4) ) Recent Results (from the past 240 hour(s))  Resp Panel by RT-PCR (Flu A&B, Covid) Nasopharyngeal Swab     Status: None   Collection Time: 03/07/20  2:30 AM   Specimen: Nasopharyngeal Swab; Nasopharyngeal(NP) swabs in vial transport medium  Result Value Ref Range Status   SARS Coronavirus 2 by RT PCR NEGATIVE NEGATIVE Final    Comment: (NOTE) SARS-CoV-2 target nucleic acids are NOT DETECTED.  The SARS-CoV-2 RNA is generally detectable in upper respiratory specimens during the acute phase of  infection. The lowest concentration of SARS-CoV-2 viral copies this assay can detect is 138 copies/mL. A negative result does not preclude SARS-Cov-2 infection and should not be used as the sole basis for treatment or other patient management decisions. A negative result may occur with  improper specimen collection/handling, submission of specimen other than nasopharyngeal swab, presence of viral mutation(s) within the areas targeted by this assay, and inadequate number of viral copies(<138 copies/mL). A negative result must be combined with clinical observations, patient history, and epidemiological information. The expected result is Negative.  Fact Sheet for Patients:  EntrepreneurPulse.com.au  Fact Sheet for Healthcare Providers:  IncredibleEmployment.be  This test is no t yet approved or cleared by the [place] FDA and  has been authorized for detection and/or diagnosis of SARS-CoV-2 by FDA under an Emergency Use Authorization (EUA). This EUA will remain  in effect (meaning this test can be used) for the duration of the COVID-19 declaration under Section 564(b)(1) of the Act, 21 U.S.C.section 360bbb-3(b)(1), unless the authorization is terminated  or revoked sooner.       Influenza A by PCR NEGATIVE NEGATIVE Final   Influenza B by PCR NEGATIVE NEGATIVE Final    Comment: (NOTE) The Xpert Xpress SARS-CoV-2/FLU/RSV plus assay is intended as an aid in the diagnosis of influenza from Nasopharyngeal swab specimens and should not be used as a sole basis for treatment. Nasal washings and aspirates are unacceptable for Xpert Xpress SARS-CoV-2/FLU/RSV testing.  Fact Sheet for Patients: EntrepreneurPulse.com.au  Fact Sheet for Healthcare Providers: IncredibleEmployment.be  This test is not yet approved or cleared by the [place] FDA and has been authorized for detection and/or diagnosis of SARS-CoV-2  by FDA under an Emergency Use Authorization (EUA). This EUA will remain in effect (meaning this test can be used) for the duration of the COVID-19 declaration under Section 564(b)(1) of the Act, 21 U.S.C. section 360bbb-3(b)(1), unless the authorization is terminated or revoked.  Performed at [facility], [place] [address], [place], [place] [address]      Radiological Exams on Admission: DG Chest 2 View  Result Date: 03/07/2020 CLINICAL DATA:  Shortness of breath EXAM: CHEST - 2 VIEW COMPARISON:  03/24/2019 FINDINGS: Shallow lung inflation with mild interstitial opacity, unchanged. No focal consolidation. Normal pleural spaces and cardiomediastinal contours. IMPRESSION: Shallow lung inflation without focal airspace disease. Electronically Signed   By: Ulyses Jarred M.D.   On: 03/07/2020 03:30    Assessment/Plan   1. Acute kidney injury  - Presents with uncontrolled blood sugars and dysuria and is found to have SCr of 2.27, up from apparent baseline of ~1  - She is being treated for UTI but does not have flank pain or sepsis  - Hold lisinopril, renally-dose medications, hydrate with IVF, repeat chem panel in am    2. Uncontrolled type II DM  - A1c was 10.9% one year ago, serum glucose 388 in ED  - Appreciate Diabetes Coordinator recommendations, will check CBGs and  use Lantus 40 units qD plus Novolog 0-15 units TID & HS    3. Chronic pain  - No pain complaints on admission  - Continue fentanyl and lidocaine patches, continue oxycodone with dose reduction in light of renal insufficiency, and continue gabapentin and baclofen with renal-dosing    4. Depression, anxiety  - Appears to be stable  - Hold Cymbalta until renal function improves, continue Elavil and Abilify    5. UTI  - She is not septic on admission  - Send urine for culture, treat with Rocephin for now    6. Hypothyroidism  - Continue Synthroid     DVT prophylaxis: Lovenox  Code Status: Full  Family  Communication: Discussed with patient  Disposition Plan:  Patient is from: home  Anticipated d/c is to: Home  Anticipated d/c date is: 12/15 or 03/09/20 Patient currently: Pending improvement in renal function  Consults called: None  Admission status: Observation     Vianne Bulls, MD [facility]  03/07/2020, 11:10 PM

## 2020-03-07 NOTE — ED Triage Notes (Signed)
Emergency Medicine Provider Triage Evaluation Note  EMONII [name] , a 49 y.o. female  was evaluated in triage.  Pt complains of "I'm jerking."  Patient states that she has had shaking and jerking of her upper and lower extremities, onset tonight.  She reports that she had similar symptoms about a week ago prior to having a breakthrough seizure despite remaining compliant with her home Keppra.  She reports that for the last week that she has been having dysuria.  She does have a history of chronic low back pain, but states that she has been having worsening pain for the last week.  She does note that she had 1 episode of urinary incontinence.  She denies fecal incontinence.  She does also feel as if her legs have been more weak for the last week.  She denies numbness.  She did receive a steroid shot in her knee on 11/24, but has not had any recent injections in her back.  Around that time, she began to notice dyspnea on exertion.  No dyspnea at rest.   For the last 2 to 3 days, she reports that she has been markedly fatigued.  Reports that she has slept almost the entire day for the last few days.  Because of this, she has not been eating and drinking well and has only voided twice in the last 24 hours.  She denies fever, chills, abdominal pain, vomiting, leg swelling, headache, visual changes, chest pain.  She has not been taking additional doses have her home pain medication.  She has had no recent adjustments to her home medication regimen.  She was on a course of azithromycin and amoxicillin for a URI, but states that she did not complete the course of amoxicillin due to her symptoms.  Review of Systems  Positive: Tremors, seizures, weakness, dyspnea, back pain, dysuria, urinary incontinence Negative: Fecal incontinence, fever, chills, chest pain, abdominal pain, hematuria, vaginal bleeding, leg swelling, visual changes  Physical Exam  BP (!) 119/96   Pulse (!) 117   Temp 100 F (37.8  C) (Oral)   Resp 16   SpO2 90%  Gen:   Awake, no distress, tearful, anxious appearing HEENT:  Atraumatic, face is erythematous Resp:  Normal effort  Cardiac:  Tachycardic Abd:   Nondistended, nontender  MSK:   Moves extremities without difficulty  Neuro:  Intermittent tremors noted to the bilateral forearms, no tremors noted to the bilateral lower extremities. Speech clear, alert, GCS 15, answers questions in complete, fluent sentences.,  Follows simple commands, moves all 4 extremities spontaneously, sensation is intact and equal throughout  Medical Decision Making  Medically screening exam initiated at 3:09 AM.  Appropriate orders placed.  [name] was informed that the remainder of the evaluation will be completed by another provider, this initial triage assessment does not replace that evaluation, and the importance of remaining in the ED until their evaluation is complete.  Clinical Impression  49 year old female presenting with concern that she may have another breakthrough seizure like she did 1 week ago despite compliance with her home Keppra.  She has had no recent medication changes other than antibiotics for a URI over the last few weeks.   Given concern for decreased urine output since p.o. intake with worsening low back pain and dysuria, there is concern for AKI since patient did have a history of acute renal failure.  Could also be dehydration from poor p.o. intake versus UTI or pyelonephritis.  Will order i-STAT creatinine,  UA, basic labs.  Will consider giving patient a loading dose of Keppra depending her on her renal function today.  Creatinine today is 1.95.  Patient was discussed with Dr. Christy Gentles, attending physician.  Given her creatinine clearance today with her home dose of Keppra, will defer loading dose of antiepileptic at this time she has now been observed for more than 2 and half hours in the ER without evidence of seizure-like activity.   Joline Maxcy A,  PA-C 03/07/20 [id number]

## 2020-03-08 ENCOUNTER — Inpatient Hospital Stay (HOSPITAL_COMMUNITY): Payer: Medicare Other

## 2020-03-08 DIAGNOSIS — N390 Urinary tract infection, site not specified: Secondary | ICD-10-CM | POA: Diagnosis not present

## 2020-03-08 DIAGNOSIS — Z882 Allergy status to sulfonamides status: Secondary | ICD-10-CM | POA: Diagnosis not present

## 2020-03-08 DIAGNOSIS — R569 Unspecified convulsions: Secondary | ICD-10-CM

## 2020-03-08 DIAGNOSIS — K219 Gastro-esophageal reflux disease without esophagitis: Secondary | ICD-10-CM | POA: Diagnosis not present

## 2020-03-08 DIAGNOSIS — G510 Bell's palsy: Secondary | ICD-10-CM | POA: Diagnosis not present

## 2020-03-08 DIAGNOSIS — F410 Panic disorder [episodic paroxysmal anxiety] without agoraphobia: Secondary | ICD-10-CM | POA: Diagnosis not present

## 2020-03-08 DIAGNOSIS — E785 Hyperlipidemia, unspecified: Secondary | ICD-10-CM | POA: Diagnosis not present

## 2020-03-08 DIAGNOSIS — N189 Chronic kidney disease, unspecified: Secondary | ICD-10-CM | POA: Diagnosis not present

## 2020-03-08 DIAGNOSIS — R300 Dysuria: Secondary | ICD-10-CM | POA: Diagnosis present

## 2020-03-08 DIAGNOSIS — F444 Conversion disorder with motor symptom or deficit: Secondary | ICD-10-CM

## 2020-03-08 DIAGNOSIS — Z20822 Contact with and (suspected) exposure to covid-19: Secondary | ICD-10-CM | POA: Diagnosis not present

## 2020-03-08 DIAGNOSIS — Y92239 Unspecified place in hospital as the place of occurrence of the external cause: Secondary | ICD-10-CM | POA: Diagnosis not present

## 2020-03-08 DIAGNOSIS — Z880 Allergy status to penicillin: Secondary | ICD-10-CM | POA: Diagnosis not present

## 2020-03-08 DIAGNOSIS — E039 Hypothyroidism, unspecified: Secondary | ICD-10-CM | POA: Diagnosis not present

## 2020-03-08 DIAGNOSIS — N179 Acute kidney failure, unspecified: Secondary | ICD-10-CM | POA: Diagnosis not present

## 2020-03-08 DIAGNOSIS — E1151 Type 2 diabetes mellitus with diabetic peripheral angiopathy without gangrene: Secondary | ICD-10-CM | POA: Diagnosis not present

## 2020-03-08 DIAGNOSIS — Z6841 Body Mass Index (BMI) 40.0 and over, adult: Secondary | ICD-10-CM | POA: Diagnosis not present

## 2020-03-08 DIAGNOSIS — G894 Chronic pain syndrome: Secondary | ICD-10-CM | POA: Diagnosis not present

## 2020-03-08 DIAGNOSIS — Z888 Allergy status to other drugs, medicaments and biological substances status: Secondary | ICD-10-CM | POA: Diagnosis not present

## 2020-03-08 DIAGNOSIS — T426X5A Adverse effect of other antiepileptic and sedative-hypnotic drugs, initial encounter: Secondary | ICD-10-CM | POA: Diagnosis not present

## 2020-03-08 DIAGNOSIS — E662 Morbid (severe) obesity with alveolar hypoventilation: Secondary | ICD-10-CM | POA: Diagnosis not present

## 2020-03-08 DIAGNOSIS — F40240 Claustrophobia: Secondary | ICD-10-CM | POA: Diagnosis not present

## 2020-03-08 DIAGNOSIS — E1122 Type 2 diabetes mellitus with diabetic chronic kidney disease: Secondary | ICD-10-CM | POA: Diagnosis not present

## 2020-03-08 DIAGNOSIS — G253 Myoclonus: Secondary | ICD-10-CM | POA: Diagnosis not present

## 2020-03-08 DIAGNOSIS — E1165 Type 2 diabetes mellitus with hyperglycemia: Secondary | ICD-10-CM | POA: Diagnosis not present

## 2020-03-08 DIAGNOSIS — E118 Type 2 diabetes mellitus with unspecified complications: Secondary | ICD-10-CM | POA: Diagnosis not present

## 2020-03-08 DIAGNOSIS — I129 Hypertensive chronic kidney disease with stage 1 through stage 4 chronic kidney disease, or unspecified chronic kidney disease: Secondary | ICD-10-CM | POA: Diagnosis not present

## 2020-03-08 DIAGNOSIS — M797 Fibromyalgia: Secondary | ICD-10-CM | POA: Diagnosis not present

## 2020-03-08 DIAGNOSIS — Z886 Allergy status to analgesic agent status: Secondary | ICD-10-CM | POA: Diagnosis not present

## 2020-03-08 DIAGNOSIS — F418 Other specified anxiety disorders: Secondary | ICD-10-CM | POA: Diagnosis not present

## 2020-03-08 LAB — CBC
HCT: 38.6 % (ref 36.0–46.0)
Hemoglobin: 12.5 g/dL (ref 12.0–15.0)
MCH: 30.2 pg (ref 26.0–34.0)
MCHC: 32.4 g/dL (ref 30.0–36.0)
MCV: 93.2 fL (ref 80.0–100.0)
Platelets: 180 10*3/uL (ref 150–400)
RBC: 4.14 MIL/uL (ref 3.87–5.11)
RDW: 13.2 % (ref 11.5–15.5)
WBC: 8.7 10*3/uL (ref 4.0–10.5)
nRBC: 0 % (ref 0.0–0.2)

## 2020-03-08 LAB — GLUCOSE, CAPILLARY
Glucose-Capillary: 320 mg/dL — ABNORMAL HIGH (ref 70–99)
Glucose-Capillary: 394 mg/dL — ABNORMAL HIGH (ref 70–99)
Glucose-Capillary: 383 mg/dL — ABNORMAL HIGH (ref 70–99)
Glucose-Capillary: 353 mg/dL — ABNORMAL HIGH (ref 70–99)

## 2020-03-08 LAB — BASIC METABOLIC PANEL
Anion gap: 13 (ref 5–15)
BUN: 40 mg/dL — ABNORMAL HIGH (ref 6–20)
CO2: 22 mmol/L (ref 22–32)
Calcium: 8.2 mg/dL — ABNORMAL LOW (ref 8.9–10.3)
Chloride: 98 mmol/L (ref 98–111)
Creatinine, Ser: 1.97 mg/dL — ABNORMAL HIGH (ref 0.44–1.00)
GFR, Estimated: 30 mL/min — ABNORMAL LOW (ref >60)
Glucose, Bld: 373 mg/dL — ABNORMAL HIGH (ref 70–99)
Potassium: 4 mmol/L (ref 3.5–5.1)
Sodium: 133 mmol/L — ABNORMAL LOW (ref 135–145)

## 2020-03-08 LAB — HEMOGLOBIN A1C
Hgb A1c MFr Bld: 11.9 % — ABNORMAL HIGH (ref 4.8–5.6)
Mean Plasma Glucose: 294.83 mg/dL

## 2020-03-08 MED ORDER — INSULIN ASPART 100 UNIT/ML ~~LOC~~ SOLN
10.0000 [IU] | Freq: Three times a day (TID) | SUBCUTANEOUS | Status: DC
  Administered 2020-03-08: 10 [IU] via SUBCUTANEOUS

## 2020-03-08 MED ORDER — SODIUM CHLORIDE 0.9 % IV SOLN: INTRAVENOUS | Status: DC

## 2020-03-08 MED ORDER — LORAZEPAM 2 MG/ML IJ SOLN
1.0000 mg | Freq: Once | INTRAMUSCULAR | Status: AC | PRN
  Administered 2020-03-09: 1 mg via INTRAVENOUS
  Filled 2020-03-08: qty 1

## 2020-03-08 MED ORDER — INSULIN GLARGINE 100 UNIT/ML ~~LOC~~ SOLN
60.0000 [IU] | Freq: Every day | SUBCUTANEOUS | Status: DC
  Administered 2020-03-08: 60 [IU] via SUBCUTANEOUS
  Filled 2020-03-08 (×3): qty 0.6

## 2020-03-08 NOTE — Progress Notes (Signed)
EEG complete - results pending 

## 2020-03-08 NOTE — Progress Notes (Signed)
Paged Dr Myna Hidalgo to ask if ok to give 10 units of mealtime insulin patient missed while in MRI. Per Dr Myna Hidalgo ok to give if patient eats her dinner tray. Patient just finished dinner at 2245 due to bedside EEG. Giving mealtime coverage and sliding scale as ordered. Will continue to monitor patient.

## 2020-03-08 NOTE — Progress Notes (Addendum)
Progress Note    [name]  HEN:[id number] DOB: 01/31/1971  DOA: 03/07/2020 PCP: Pecolia Ades, NP      Brief Narrative:    Medical records reviewed and are as summarized below:  [name] is a 49 y.o. female with medical history significant for depression, anxiety, seizures, chronic pain, insulin-dependent diabetes mellitus, and hypertension, who presented to the emergency department for evaluation of hyperglycemia, dysuria, and general malaise.      Assessment/Plan:   Principal Problem:   AKI (acute kidney injury) ([place]) Active Problems:   Diabetes mellitus type 2 with complications, uncontrolled ([place])   Sleep apnea   Hypothyroidism   Chronic pain   Depression with anxiety   Seizures ([place])   Acute lower UTI    Body mass index is 57.49 kg/m.  (Morbid obesity)   AKI: Creatinine is slowly improving.  Continue IV fluids.  Monitor BMP and urine output.  Lisinopril has been held.  Type II DM with severe hyperglycemia: Hemoglobin A1c is 11.9.  She said she takes NovoLog 75 units in the morning, 25 units at lunch and 75 units at night.  She says she no longer takes Lantus 75 units nightly because it was discontinued by her doctor.  She said her HbA1c had gone down to 7.1 at some point.,  Chart review showed hemoglobin A1c was 10.9 n 11 months ago and hemoglobin A1c was 8.7 2 years ago.  Lantus from 40 units to 60 units nightly.  Add NovoLog 10 units 3 times daily with meals.  Continue NovoLog per sliding scale.  Monitor glucose levels closely and adjust insulin therapy accordingly.  Acute lower UTI: Continue IV Rocephin.  Follow-up urine culture.  Seizure disorder: Continue Keppra.  It is not clear whether the movements of the upper extremities represent partial seizures.  Consulted neurologist, Dr. Curly [place], for further evaluation.  Depression and anxiety: Continue antidepressants  Other comorbidities include chronic pain and  hypothyroidism.   Diet Order            Diet heart healthy/carb modified Room service appropriate? Yes; Fluid consistency: Thin  Diet effective now                    Consultants:  Neurology  Procedures:  None    Medications:   . amitriptyline  50 mg Oral QHS  . ARIPiprazole  5 mg Oral Daily  . atorvastatin  20 mg Oral Daily  . baclofen  5 mg Oral BID  . enoxaparin (LOVENOX) injection  40 mg Subcutaneous Q24H  . fentaNYL  1 patch Transdermal Q72H  . gabapentin  300 mg Oral BID  . insulin aspart  0-15 Units Subcutaneous TID WC  . insulin aspart  0-5 Units Subcutaneous QHS  . insulin glargine  40 Units Subcutaneous QHS  . levETIRAcetam  750 mg Oral BID  . levothyroxine  150 mcg Oral Q0600  . lidocaine  4 patch Transdermal Q24H  . pantoprazole  40 mg Oral Daily  . topiramate  200 mg Oral BID   Continuous Infusions: . sodium chloride 125 mL/hr at 03/08/20 0859  . cefTRIAXone (ROCEPHIN)  IV 1 g (03/08/20 0027)     Anti-infectives (From admission, onward)   Start     Dose/Rate Route Frequency Ordered Stop   03/08/20 0000  cefTRIAXone (ROCEPHIN) 1 g in sodium chloride 0.9 % 100 mL IVPB        1 g 200 mL/hr over 30 Minutes Intravenous  Every 24 hours 03/07/20 2309     03/07/20 1700  cefTRIAXone (ROCEPHIN) 1 g in sodium chloride 0.9 % 100 mL IVPB  Status:  Discontinued        1 g 200 mL/hr over 30 Minutes Intravenous  Once 03/07/20 1646 03/07/20 1647   03/07/20 1700  ciprofloxacin (CIPRO) tablet 500 mg        500 mg Oral  Once 03/07/20 1655 03/07/20 1705             Family Communication/Anticipated D/C date and plan/Code Status   DVT prophylaxis: enoxaparin (LOVENOX) injection 40 mg Start: 03/08/20 0000     Code Status: Full Code  Family Communication: None Disposition Plan:    Status is: Observation  The patient will require care spanning > 2 midnights and should be moved to inpatient because: IV treatments appropriate due to intensity of  illness or inability to take PO and Inpatient level of care appropriate due to severity of illness  Dispo: The patient is from: Home              Anticipated d/c is to: Home              Anticipated d/c date is: 1 day              Patient currently is not medically stable to d/c.           Subjective:   C/o jerking movements of bilateral upper extremities.  She said these movements have become more frequent and she attributes this to seizures.  Objective:    Vitals:   03/08/20 0442 03/08/20 0714 03/08/20 0857 03/08/20 0901  BP: (!) 96/59 98/66 99/65  104/60  Pulse: 82 83 85 90  Resp: 18 20 16    Temp: 97.7 F (36.5 C) 97.8 F (36.6 C)    TempSrc: Oral Oral    SpO2: 93% 97% 95%   Weight:      Height:       No data found.   Intake/Output Summary (Last 24 hours) at 03/08/2020 0943 Last data filed at 03/08/2020 0900 Gross per 24 hour  Intake 1732.19 ml  Output --  Net 1732.19 ml   Filed Weights   03/07/20 1707 03/08/20 0048  Weight: (!) 158.8 kg (!) 156.7 kg    Exam:  GEN: NAD SKIN: Erythema under b/l breasts  EYES: EOMI ENT: MMM CV: RRR PULM: CTA B ABD: soft, obese, suprapubic tenderness, +BS CNS: AAO x 3, right foot drop, jerky movement of upper extremities/ tremors EXT: No edema or tenderness   Data Reviewed:   I have personally reviewed following labs and imaging studies:  Labs: Labs show the following:   Basic Metabolic Panel: Recent Labs  Lab 03/07/20 0223 03/07/20 1628 03/08/20 0303  NA 131* 130* 133*  K 3.8 3.9 4.0  CL 100 97* 98  CO2 19* 19* 22  GLUCOSE 304* 388* 373*  BUN 25* 35* 40*  CREATININE 1.95* 2.27* 1.97*  CALCIUM 9.0 9.0 8.2*   GFR Estimated Creatinine Clearance: 51.7 mL/min (A) (by C-G formula based on SCr of 1.97 mg/dL (H)). Liver Function Tests: No results for input(s): AST, ALT, ALKPHOS, BILITOT, PROT, ALBUMIN in the last 168 hours. No results for input(s): LIPASE, AMYLASE in the last 168 hours. No results for  input(s): AMMONIA in the last 168 hours. Coagulation profile No results for input(s): INR, PROTIME in the last 168 hours.  CBC: Recent Labs  Lab 03/07/20 0223 03/08/20 0303  WBC 11.6*  8.7  HGB 15.0 12.5  HCT 44.5 38.6  MCV 93.3 93.2  PLT 233 180   Cardiac Enzymes: No results for input(s): CKTOTAL, CKMB, CKMBINDEX, TROPONINI in the last 168 hours. BNP (last 3 results) No results for input(s): PROBNP in the last 8760 hours. CBG: Recent Labs  Lab 03/07/20 0232 03/07/20 0733 03/07/20 1139 03/07/20 2027 03/08/20 0535  GLUCAP 312* 346* 341* 313* 320*   D-Dimer: No results for input(s): DDIMER in the last 72 hours. Hgb A1c: Recent Labs    03/08/20 0303  HGBA1C 11.9*   Lipid Profile: No results for input(s): CHOL, HDL, LDLCALC, TRIG, CHOLHDL, LDLDIRECT in the last 72 hours. Thyroid function studies: No results for input(s): TSH, T4TOTAL, T3FREE, THYROIDAB in the last 72 hours.  Invalid input(s): FREET3 Anemia work up: No results for input(s): VITAMINB12, FOLATE, FERRITIN, TIBC, IRON, RETICCTPCT in the last 72 hours. Sepsis Labs: Recent Labs  Lab 03/07/20 0223 03/08/20 0303  WBC 11.6* 8.7    Microbiology Recent Results (from the past 240 hour(s))  Resp Panel by RT-PCR (Flu A&B, Covid) Nasopharyngeal Swab     Status: None   Collection Time: 03/07/20  2:30 AM   Specimen: Nasopharyngeal Swab; Nasopharyngeal(NP) swabs in vial transport medium  Result Value Ref Range Status   SARS Coronavirus 2 by RT PCR NEGATIVE NEGATIVE Final    Comment: (NOTE) SARS-CoV-2 target nucleic acids are NOT DETECTED.  The SARS-CoV-2 RNA is generally detectable in upper respiratory specimens during the acute phase of infection. The lowest concentration of SARS-CoV-2 viral copies this assay can detect is 138 copies/mL. A negative result does not preclude SARS-Cov-2 infection and should not be used as the sole basis for treatment or other patient management decisions. A negative result  may occur with  improper specimen collection/handling, submission of specimen other than nasopharyngeal swab, presence of viral mutation(s) within the areas targeted by this assay, and inadequate number of viral copies(<138 copies/mL). A negative result must be combined with clinical observations, patient history, and epidemiological information. The expected result is Negative.  Fact Sheet for Patients:  EntrepreneurPulse.com.au  Fact Sheet for Healthcare Providers:  IncredibleEmployment.be  This test is no t yet approved or cleared by the [place] FDA and  has been authorized for detection and/or diagnosis of SARS-CoV-2 by FDA under an Emergency Use Authorization (EUA). This EUA will remain  in effect (meaning this test can be used) for the duration of the COVID-19 declaration under Section 564(b)(1) of the Act, 21 U.S.C.section 360bbb-3(b)(1), unless the authorization is terminated  or revoked sooner.       Influenza A by PCR NEGATIVE NEGATIVE Final   Influenza B by PCR NEGATIVE NEGATIVE Final    Comment: (NOTE) The Xpert Xpress SARS-CoV-2/FLU/RSV plus assay is intended as an aid in the diagnosis of influenza from Nasopharyngeal swab specimens and should not be used as a sole basis for treatment. Nasal washings and aspirates are unacceptable for Xpert Xpress SARS-CoV-2/FLU/RSV testing.  Fact Sheet for Patients: EntrepreneurPulse.com.au  Fact Sheet for Healthcare Providers: IncredibleEmployment.be  This test is not yet approved or cleared by the [place] FDA and has been authorized for detection and/or diagnosis of SARS-CoV-2 by FDA under an Emergency Use Authorization (EUA). This EUA will remain in effect (meaning this test can be used) for the duration of the COVID-19 declaration under Section 564(b)(1) of the Act, 21 U.S.C. section 360bbb-3(b)(1), unless the authorization is terminated  or revoked.  Performed at [facility], [place] [address],  [place], [place] [address]     Procedures and diagnostic studies:  DG Chest 2 View  Result Date: 03/07/2020 CLINICAL DATA:  Shortness of breath EXAM: CHEST - 2 VIEW COMPARISON:  03/24/2019 FINDINGS: Shallow lung inflation with mild interstitial opacity, unchanged. No focal consolidation. Normal pleural spaces and cardiomediastinal contours. IMPRESSION: Shallow lung inflation without focal airspace disease. Electronically Signed   By: Ulyses Jarred M.D.   On: 03/07/2020 03:30               LOS: 0 days   Sumi Lye  [facility]   Pager on [email or web address]. If 7PM-7AM, please contact night-coverage at [email or web address]     03/08/2020, 9:43 AM

## 2020-03-08 NOTE — Progress Notes (Signed)
Pt claustrophobic,pt states could not tolerate exam after given meds. Pt refused MRI. RN aware.

## 2020-03-08 NOTE — Progress Notes (Addendum)
Inpatient Diabetes Program Recommendations  AACE/ADA: New Consensus Statement on Inpatient Glycemic Control (2015)  Target Ranges:  Prepandial:   less than 140 mg/dL      Peak postprandial:   less than 180 mg/dL (1-2 hours)      Critically ill patients:  140 - 180 mg/dL   Lab Results  Component Value Date   GLUCAP 394 (H) 03/08/2020   HGBA1C 11.9 (H) 03/08/2020    Review of Glycemic Control Results for EDELIN, FRYER (MRN [id number]) as of 03/08/2020 13:10  Ref. Range 03/07/2020 07:33 03/07/2020 11:39 03/07/2020 20:27 03/08/2020 05:35 03/08/2020 11:29  Glucose-Capillary Latest Ref Range: 70 - 99 mg/dL 346 (H) 341 (H) 313 (H) 320 (H) 394 (H)   Diabetes history: DM 2 Outpatient Diabetes medications:  Lantus 75 units q HS, Novolog 75 units q AM, Novolog 25 units with lunch, Novolog 75 units with supper Current orders for Inpatient glycemic control:  Lantus 40 units q HS, Novolog moderate tid with meals and HS  Inpatient Diabetes Program Recommendations:   Consider increasing Lantus to 60 units q HS.  Also please add Novolog meal coverage 15 units tid with meals (hold if patient eats less than 50%).   Thanks  Adah Perl, RN, BC-ADM Inpatient Diabetes Coordinator Pager [phone] (8a-5p)

## 2020-03-08 NOTE — Progress Notes (Signed)
Provided update to patient's fiance, with patient's permission.
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
## 2020-03-08 NOTE — Progress Notes (Signed)
Patient's home meds has been given to fiance at the bedside to take home. Primary RN made aware.

## 2020-03-09 ENCOUNTER — Inpatient Hospital Stay (HOSPITAL_COMMUNITY): Payer: Medicare Other

## 2020-03-09 DIAGNOSIS — R569 Unspecified convulsions: Secondary | ICD-10-CM | POA: Diagnosis not present

## 2020-03-09 DIAGNOSIS — E118 Type 2 diabetes mellitus with unspecified complications: Secondary | ICD-10-CM | POA: Diagnosis not present

## 2020-03-09 DIAGNOSIS — F418 Other specified anxiety disorders: Secondary | ICD-10-CM | POA: Diagnosis not present

## 2020-03-09 DIAGNOSIS — N179 Acute kidney failure, unspecified: Secondary | ICD-10-CM | POA: Diagnosis not present

## 2020-03-09 DIAGNOSIS — E1165 Type 2 diabetes mellitus with hyperglycemia: Secondary | ICD-10-CM | POA: Diagnosis not present

## 2020-03-09 DIAGNOSIS — N390 Urinary tract infection, site not specified: Secondary | ICD-10-CM | POA: Diagnosis not present

## 2020-03-09 DIAGNOSIS — R300 Dysuria: Secondary | ICD-10-CM | POA: Diagnosis not present

## 2020-03-09 LAB — CBC
HCT: 34.7 % — ABNORMAL LOW (ref 36.0–46.0)
Hemoglobin: 11.8 g/dL — ABNORMAL LOW (ref 12.0–15.0)
MCH: 31.6 pg (ref 26.0–34.0)
MCHC: 34 g/dL (ref 30.0–36.0)
MCV: 93 fL (ref 80.0–100.0)
Platelets: 151 10*3/uL (ref 150–400)
RBC: 3.73 MIL/uL — ABNORMAL LOW (ref 3.87–5.11)
RDW: 13.2 % (ref 11.5–15.5)
WBC: 6.3 10*3/uL (ref 4.0–10.5)
nRBC: 0 % (ref 0.0–0.2)

## 2020-03-09 LAB — GLUCOSE, CAPILLARY
Glucose-Capillary: 305 mg/dL — ABNORMAL HIGH (ref 70–99)
Glucose-Capillary: 345 mg/dL — ABNORMAL HIGH (ref 70–99)
Glucose-Capillary: 376 mg/dL — ABNORMAL HIGH (ref 70–99)
Glucose-Capillary: 299 mg/dL — ABNORMAL HIGH (ref 70–99)
Glucose-Capillary: 357 mg/dL — ABNORMAL HIGH (ref 70–99)

## 2020-03-09 LAB — BASIC METABOLIC PANEL
Anion gap: 8 (ref 5–15)
BUN: 28 mg/dL — ABNORMAL HIGH (ref 6–20)
CO2: 20 mmol/L — ABNORMAL LOW (ref 22–32)
Calcium: 7.9 mg/dL — ABNORMAL LOW (ref 8.9–10.3)
Chloride: 105 mmol/L (ref 98–111)
Creatinine, Ser: 1.18 mg/dL — ABNORMAL HIGH (ref 0.44–1.00)
GFR, Estimated: 56 mL/min — ABNORMAL LOW (ref >60)
Glucose, Bld: 324 mg/dL — ABNORMAL HIGH (ref 70–99)
Potassium: 3.8 mmol/L (ref 3.5–5.1)
Sodium: 133 mmol/L — ABNORMAL LOW (ref 135–145)

## 2020-03-09 IMAGING — MR MR HEAD W/O CM
15 of 16 series · 44 of 48 positions shown · non-contrast
Comparison: CT head [DATE]

CLINICAL DATA: Seizure.

EXAM:
MRI HEAD WITHOUT CONTRAST
TECHNIQUE: Multiplanar, multiecho pulse sequences of the brain and surrounding
structures were obtained without intravenous contrast.

[Series 9: DWI · axial · 3.0mm · 0.96mm/px · z∈[-132,+3]mm · 5 of 96 slices shown (1 of 4)]
[im 1/96]
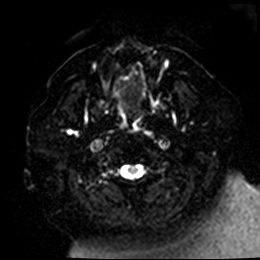
[im 24/96]
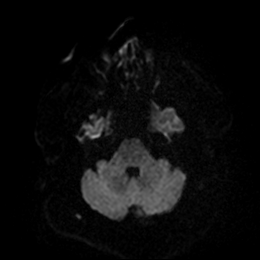
[im 48/96]
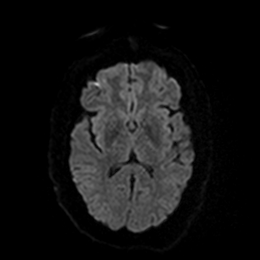
[im 72/96]
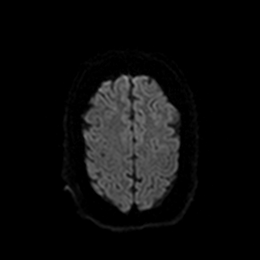
[im 96/96]
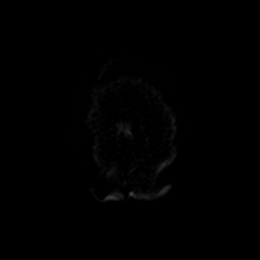

[Series 10: DWI · axial · 3.0mm · 0.96mm/px · z∈[-132,+3]mm · 2 of 48 slices shown (2 of 4)]
[im 1/48]
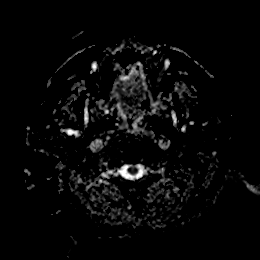
[im 48/48]
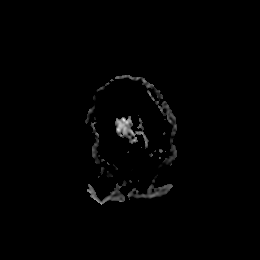

[Series 11: DWI · coronal · 4.0mm · 0.88mm/px · 5 of 72 slices shown (3 of 4)]
[im 1/72]
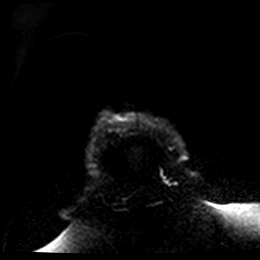
[im 18/72]
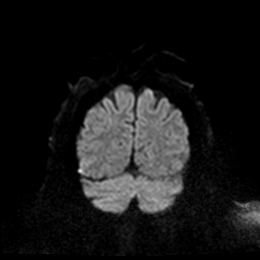
[im 36/72]
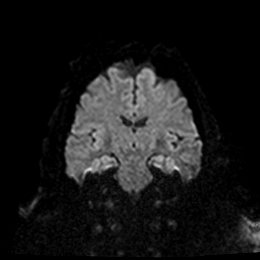
[im 54/72]
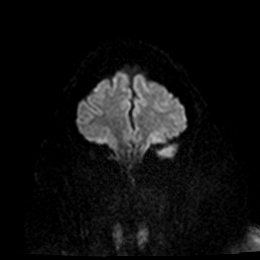
[im 72/72]
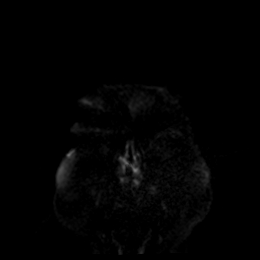

[Series 12: DWI · coronal · 4.0mm · 0.88mm/px · 2 of 36 slices shown (4 of 4)]
[im 1/36]
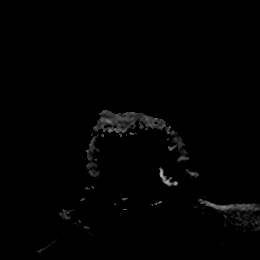
[im 36/36]
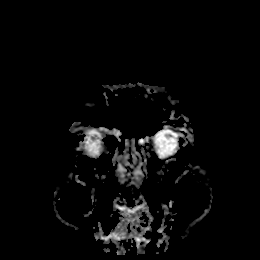

[Series 13: T2 · axial · 5.0mm · 0.78mm/px · z∈[-130,+8]mm · 2 of 25 slices shown (1 of 2)]
[im 1/25]
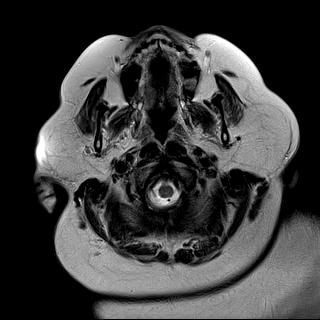
[im 25/25]
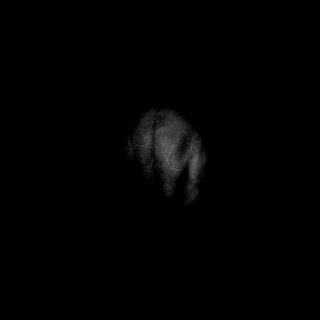

[Series 14: FLAIR · axial · 5.0mm · 0.49mm/px · z∈[-134,+3]mm · 2 of 25 slices shown (1 of 3)]
[im 1/25]
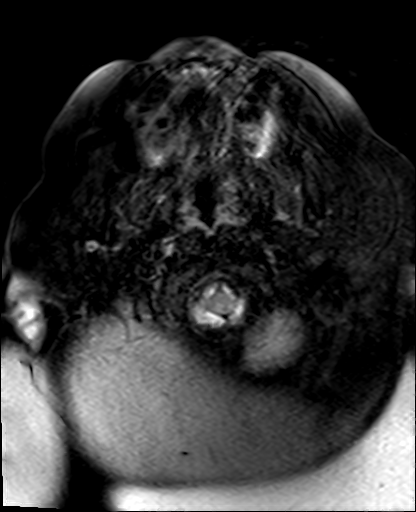
[im 25/25]
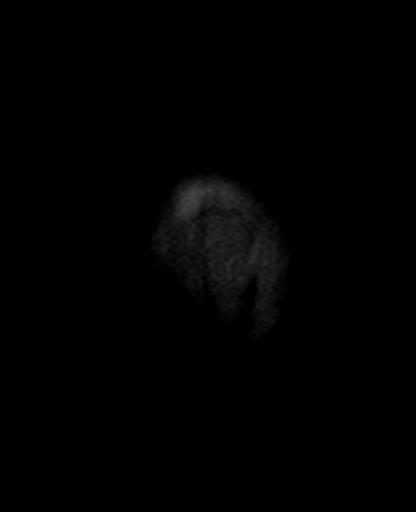

[Series 15: T1 · sagittal · 5.0mm · 0.75mm/px · 1 of 23 slices shown]
[im 1/23]
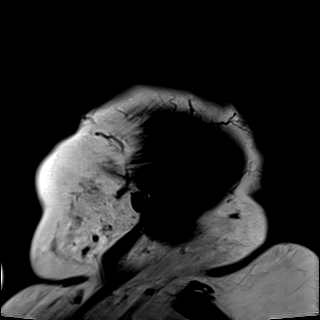

[Series 16: FLAIR · axial · 5.0mm · 0.90mm/px · z∈[-153,-1]mm · 2 of 27 slices shown (2 of 3)]
[im 1/27]
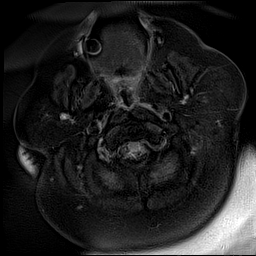
[im 27/27]
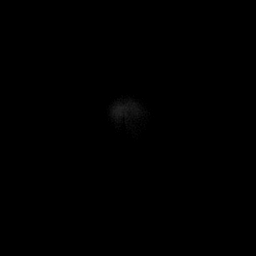

[Series 17: mag_images · axial · 3.0mm · 0.98mm/px · z∈[-150,+19]mm · 4 of 60 slices shown]
[im 1/60]
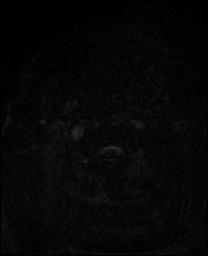
[im 20/60]
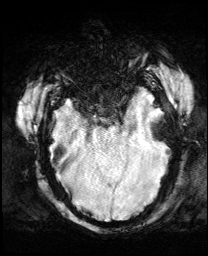
[im 40/60]
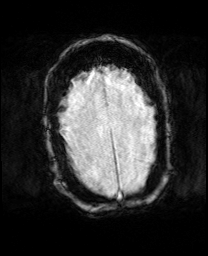
[im 60/60]
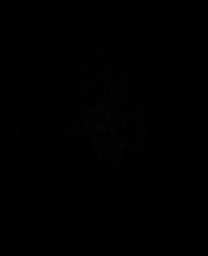

[Series 18: pha_images · axial · 3.0mm · 0.98mm/px · z∈[-150,+13]mm · 4 of 58 slices shown]
[im 1/58]
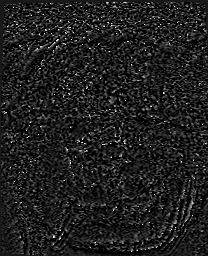
[im 20/58]
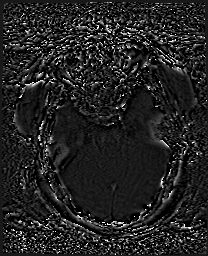
[im 39/58]
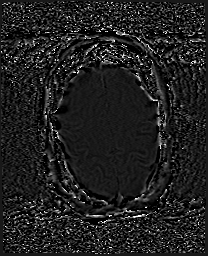
[im 58/58]
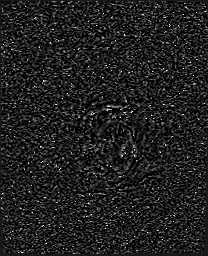

[Series 19: swi_images · axial · 3.0mm · 0.98mm/px · z∈[-150,+19]mm · 4 of 60 slices shown]
[im 1/60]
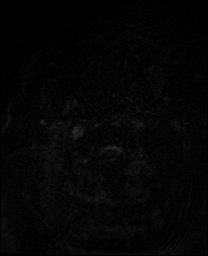
[im 20/60]
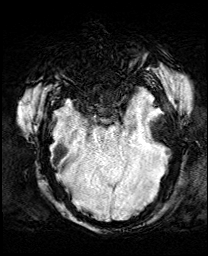
[im 40/60]
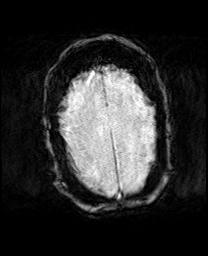
[im 60/60]
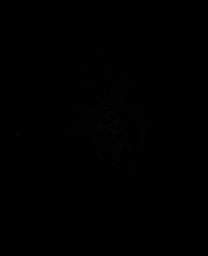

[Series 20: mip_images(sw) · axial · 24.0mm · 0.98mm/px · z∈[-140,+9]mm · 3 of 53 slices shown]
[im 1/53]
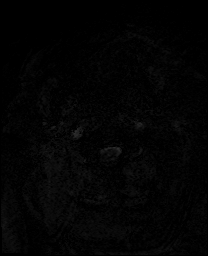
[im 27/53]
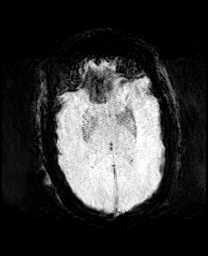
[im 53/53]
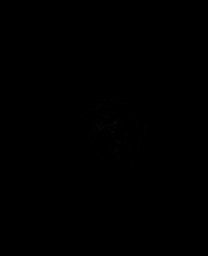

[Series 22: T2 post-contrast · coronal · 5.0mm · 0.72mm/px · 2 of 29 slices shown]
[im 1/29]
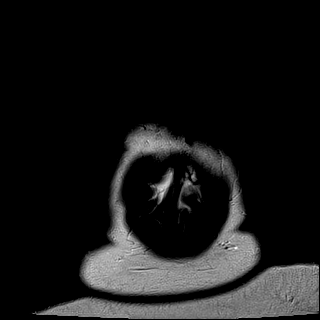
[im 29/29]
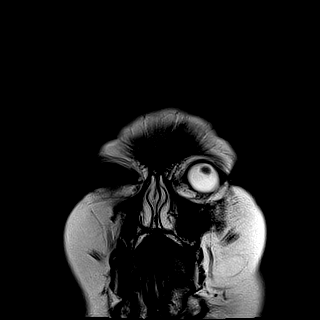

[Series 23: T2 · coronal · 3.0mm · 0.30mm/px · 3 of 40 slices shown (2 of 2)]
[im 1/40]
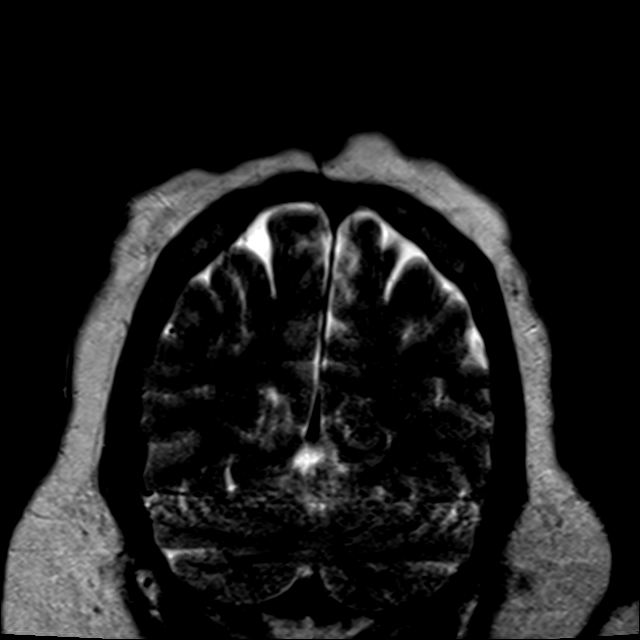
[im 20/40]
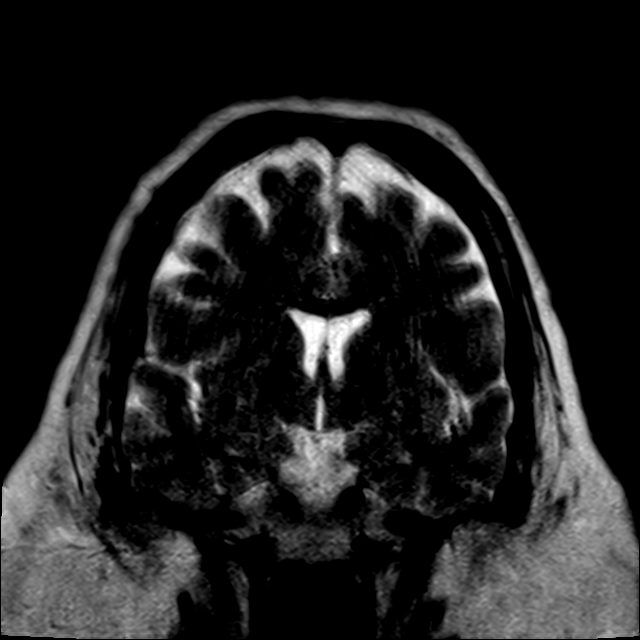
[im 40/40]
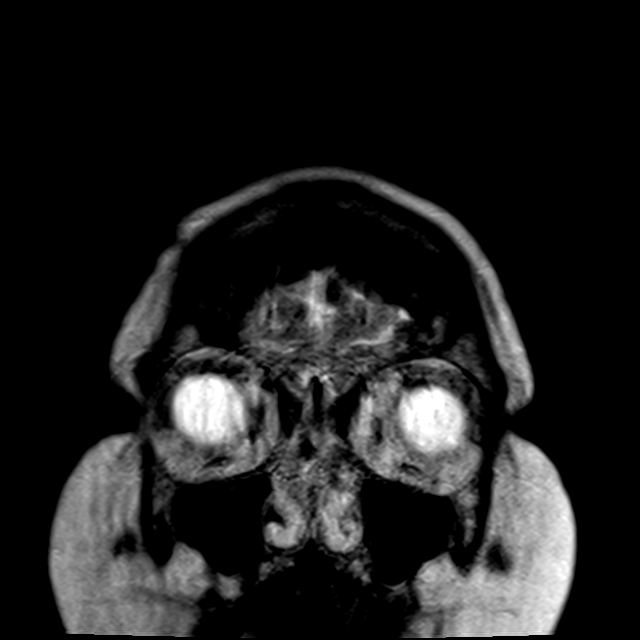

[Series 24: FLAIR · coronal · 3.0mm · 0.56mm/px · 3 of 40 slices shown (3 of 3)]
[im 1/40]
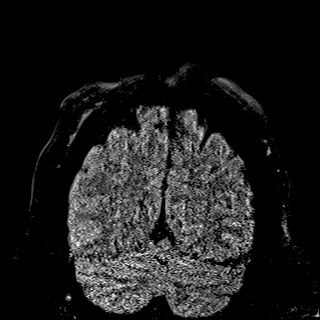
[im 20/40]
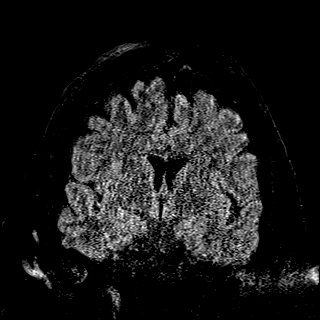
[im 40/40]
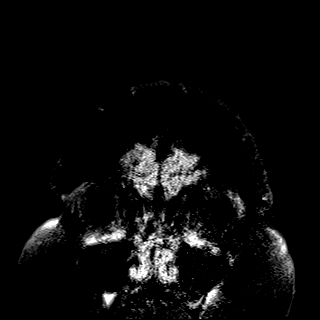

[44 of 48 positions shown; findings below may reference images not displayed]

FINDINGS: Brain: Ventricle size and cerebral volume normal. Negative for acute
infarct, hemorrhage, mass. Normal white matter. Mesial temporal lobe
normal in signal and volume bilaterally.

Vascular: Normal arterial flow voids.

Skull and upper cervical spine: Elongation of the calvarium
compatible with scaphocephaly. There appears to be prior
cranioplasty in the posterior parietal lobe in the midline.

Sinuses/Orbits: Paranasal sinuses clear.  Negative orbit

Other: None
IMPRESSION: No significant intracranial abnormality.

Scaphocephaly.

## 2020-03-09 MED ORDER — INSULIN ASPART 100 UNIT/ML ~~LOC~~ SOLN
15.0000 [IU] | Freq: Three times a day (TID) | SUBCUTANEOUS | Status: DC
  Administered 2020-03-09 – 2020-03-10 (×3): 15 [IU] via SUBCUTANEOUS

## 2020-03-09 MED ORDER — LORAZEPAM 2 MG/ML IJ SOLN: 1.0000 mg | Freq: Once | INTRAMUSCULAR | Status: AC | PRN

## 2020-03-09 MED ORDER — INSULIN GLARGINE 100 UNIT/ML ~~LOC~~ SOLN
80.0000 [IU] | Freq: Every day | SUBCUTANEOUS | Status: DC
  Administered 2020-03-09: 80 [IU] via SUBCUTANEOUS
  Filled 2020-03-09 (×2): qty 0.8

## 2020-03-09 MED ORDER — SODIUM CHLORIDE 0.9 % IV SOLN: INTRAVENOUS | Status: DC

## 2020-03-09 MED ORDER — CLOTRIMAZOLE 2 % VA CREA
1.0000 | TOPICAL_CREAM | Freq: Every day | VAGINAL | Status: AC
  Administered 2020-03-09 – 2020-03-11 (×3): 1 via VAGINAL
  Filled 2020-03-09: qty 21

## 2020-03-09 MED ORDER — LORAZEPAM 2 MG/ML IJ SOLN
INTRAMUSCULAR | Status: AC
  Administered 2020-03-09: 1 mg via INTRAVENOUS
  Filled 2020-03-09: qty 1

## 2020-03-09 NOTE — Progress Notes (Addendum)
Progress Note    NOLA BOTKINS  TFT:[id number] DOB: 01-Sep-1970  DOA: 03/07/2020 PCP: Pecolia Ades, NP      Brief Narrative:    Medical records reviewed and are as summarized below:  [name] is a 49 y.o. female with medical history significant for depression, anxiety, seizures, chronic pain, insulin-dependent diabetes mellitus, and hypertension, who presented to the emergency department for evaluation of hyperglycemia, dysuria, and general malaise.      Assessment/Plan:   Principal Problem:   AKI (acute kidney injury) ([address]) Active Problems:   Diabetes mellitus type 2 with complications, uncontrolled ([place])   Sleep apnea   Hypothyroidism   Chronic pain   Depression with anxiety   Seizures ([place])   Acute lower UTI    Body mass index is 57.3 kg/m.  (Morbid obesity)   AKI: Creatinine is still trending down.  Continue IV fluids.  Monitor BMP and urine output.  Lisinopril is still on hold.    Type II DM with severe hyperglycemia: Hemoglobin A1c is 11.9.  She said she takes NovoLog 75 units in the morning, 25 units at lunch and 75 units at night.  She says she no longer takes Lantus 75 units nightly because it was discontinued by her doctor.  She said her HbA1c had gone down to 7.1 at some point.  Chart review showed hemoglobin A1c was 10.9  11 months ago and hemoglobin A1c was 8.7 2 years ago.  She is still hyperglycemic.  Increase Lantus to 80 units nightly.  NovoLog has been increased to 15 units 3 times daily.  Continue NovoLog as needed per sliding scale insulin.  Acute lower UTI: Urine culture still pending.  Continue IV Rocephin.  Seizure disorder: MRI brain did not show any acute abnormality.  EEG was suggestive of mild diffuse encephalopathy but no evidence of seizures or epileptiform discharges.  Continue Keppra.  Appreciate input from neurologist.  Depression and anxiety: Continue antidepressants  Other comorbidities include chronic pain  and hypothyroidism.  Out of bed to chair and encourage ambulation.   Diet Order            Diet heart healthy/carb modified Room service appropriate? Yes; Fluid consistency: Thin  Diet effective now                    Consultants:  Neurology  Procedures:  EEG on 03/09/2020    Medications:   . amitriptyline  50 mg Oral QHS  . ARIPiprazole  5 mg Oral Daily  . atorvastatin  20 mg Oral Daily  . baclofen  5 mg Oral BID  . enoxaparin (LOVENOX) injection  40 mg Subcutaneous Q24H  . fentaNYL  1 patch Transdermal Q72H  . gabapentin  300 mg Oral BID  . insulin aspart  0-15 Units Subcutaneous TID WC  . insulin aspart  0-5 Units Subcutaneous QHS  . insulin aspart  15 Units Subcutaneous TID WC  . insulin glargine  60 Units Subcutaneous QHS  . levETIRAcetam  750 mg Oral BID  . levothyroxine  150 mcg Oral Q0600  . lidocaine  4 patch Transdermal Q24H  . pantoprazole  40 mg Oral Daily  . topiramate  200 mg Oral BID   Continuous Infusions: . sodium chloride 75 mL/hr at 03/09/20 0904  . cefTRIAXone (ROCEPHIN)  IV 1 g (03/08/20 2324)     Anti-infectives (From admission, onward)   Start     Dose/Rate Route Frequency Ordered Stop  03/08/20 0000  cefTRIAXone (ROCEPHIN) 1 g in sodium chloride 0.9 % 100 mL IVPB        1 g 200 mL/hr over 30 Minutes Intravenous Every 24 hours 03/07/20 2309     03/07/20 1700  cefTRIAXone (ROCEPHIN) 1 g in sodium chloride 0.9 % 100 mL IVPB  Status:  Discontinued        1 g 200 mL/hr over 30 Minutes Intravenous  Once 03/07/20 1646 03/07/20 1647   03/07/20 1700  ciprofloxacin (CIPRO) tablet 500 mg        500 mg Oral  Once 03/07/20 1655 03/07/20 1705             Family Communication/Anticipated D/C date and plan/Code Status   DVT prophylaxis: enoxaparin (LOVENOX) injection 40 mg Start: 03/08/20 0000     Code Status: Full Code  Family Communication: None Disposition Plan:    Status is: Observation  The patient will require care  spanning > 2 midnights and should be moved to inpatient because: IV treatments appropriate due to intensity of illness or inability to take PO and Inpatient level of care appropriate due to severity of illness  Dispo: The patient is from: Home              Anticipated d/c is to: Home              Anticipated d/c date is: 1 day              Patient currently is not medically stable to d/c.           Subjective:   Interval events noted.  She still sleepy from Ativan that was given earlier for sedation for MRI brain.  Objective:    Vitals:   03/09/20 0021 03/09/20 0416 03/09/20 0751 03/09/20 0756  BP: 120/61 (!) 83/47 118/73 104/65  Pulse: 89 79 81 79  Resp: 14 18  18   Temp: 98.2 F (36.8 C) 97.8 F (36.6 C)  97.6 F (36.4 C)  TempSrc: Oral Oral  Axillary  SpO2: 90% 97% 97% 97%  Weight: (!) 156.2 kg     Height:       No data found.   Intake/Output Summary (Last 24 hours) at 03/09/2020 1437 Last data filed at 03/09/2020 0920 Gross per 24 hour  Intake 1138.58 ml  Output 1000 ml  Net 138.58 ml   Filed Weights   03/07/20 1707 03/08/20 0048 03/09/20 0021  Weight: (!) 158.8 kg (!) 156.7 kg (!) 156.2 kg    Exam:  GEN: NAD SKIN: Warm and dry. EYES: No pallor or icterus ENT: MMM CV: RRR PULM: CTA B ABD: soft, obese, NT, +BS CNS: Sleepy but arousable and conversant.  Chronic right foot drop EXT: No edema or tenderness      Data Reviewed:   I have personally reviewed following labs and imaging studies:  Labs: Labs show the following:   Basic Metabolic Panel: Recent Labs  Lab 03/07/20 0223 03/07/20 1628 03/08/20 0303 03/09/20 0221  NA 131* 130* 133* 133*  K 3.8 3.9 4.0 3.8  CL 100 97* 98 105  CO2 19* 19* 22 20*  GLUCOSE 304* 388* 373* 324*  BUN 25* 35* 40* 28*  CREATININE 1.95* 2.27* 1.97* 1.18*  CALCIUM 9.0 9.0 8.2* 7.9*   GFR Estimated Creatinine Clearance: 86.1 mL/min (A) (by C-G formula based on SCr of 1.18 mg/dL (H)). Liver Function  Tests: No results for input(s): AST, ALT, ALKPHOS, BILITOT, PROT, ALBUMIN in the last 168  hours. No results for input(s): LIPASE, AMYLASE in the last 168 hours. No results for input(s): AMMONIA in the last 168 hours. Coagulation profile No results for input(s): INR, PROTIME in the last 168 hours.  CBC: Recent Labs  Lab 03/07/20 0223 03/08/20 0303 03/09/20 0221  WBC 11.6* 8.7 6.3  HGB 15.0 12.5 11.8*  HCT 44.5 38.6 34.7*  MCV 93.3 93.2 93.0  PLT 233 180 151   Cardiac Enzymes: No results for input(s): CKTOTAL, CKMB, CKMBINDEX, TROPONINI in the last 168 hours. BNP (last 3 results) No results for input(s): PROBNP in the last 8760 hours. CBG: Recent Labs  Lab 03/08/20 1622 03/08/20 2038 03/09/20 0141 03/09/20 0532 03/09/20 1129  GLUCAP 383* 353* 305* 345* 376*   D-Dimer: No results for input(s): DDIMER in the last 72 hours. Hgb A1c: Recent Labs    03/08/20 0303  HGBA1C 11.9*   Lipid Profile: No results for input(s): CHOL, HDL, LDLCALC, TRIG, CHOLHDL, LDLDIRECT in the last 72 hours. Thyroid function studies: No results for input(s): TSH, T4TOTAL, T3FREE, THYROIDAB in the last 72 hours.  Invalid input(s): FREET3 Anemia work up: No results for input(s): VITAMINB12, FOLATE, FERRITIN, TIBC, IRON, RETICCTPCT in the last 72 hours. Sepsis Labs: Recent Labs  Lab 03/07/20 0223 03/08/20 0303 03/09/20 0221  WBC 11.6* 8.7 6.3    Microbiology Recent Results (from the past 240 hour(s))  Resp Panel by RT-PCR (Flu A&B, Covid) Nasopharyngeal Swab     Status: None   Collection Time: 03/07/20  2:30 AM   Specimen: Nasopharyngeal Swab; Nasopharyngeal(NP) swabs in vial transport medium  Result Value Ref Range Status   SARS Coronavirus 2 by RT PCR NEGATIVE NEGATIVE Final    Comment: (NOTE) SARS-CoV-2 target nucleic acids are NOT DETECTED.  The SARS-CoV-2 RNA is generally detectable in upper respiratory specimens during the acute phase of infection. The lowest concentration of  SARS-CoV-2 viral copies this assay can detect is 138 copies/mL. A negative result does not preclude SARS-Cov-2 infection and should not be used as the sole basis for treatment or other patient management decisions. A negative result may occur with  improper specimen collection/handling, submission of specimen other than nasopharyngeal swab, presence of viral mutation(s) within the areas targeted by this assay, and inadequate number of viral copies(<138 copies/mL). A negative result must be combined with clinical observations, patient history, and epidemiological information. The expected result is Negative.  Fact Sheet for Patients:  EntrepreneurPulse.com.au  Fact Sheet for Healthcare Providers:  IncredibleEmployment.be  This test is no t yet approved or cleared by the [place] FDA and  has been authorized for detection and/or diagnosis of SARS-CoV-2 by FDA under an Emergency Use Authorization (EUA). This EUA will remain  in effect (meaning this test can be used) for the duration of the COVID-19 declaration under Section 564(b)(1) of the Act, 21 U.S.C.section 360bbb-3(b)(1), unless the authorization is terminated  or revoked sooner.       Influenza A by PCR NEGATIVE NEGATIVE Final   Influenza B by PCR NEGATIVE NEGATIVE Final    Comment: (NOTE) The Xpert Xpress SARS-CoV-2/FLU/RSV plus assay is intended as an aid in the diagnosis of influenza from Nasopharyngeal swab specimens and should not be used as a sole basis for treatment. Nasal washings and aspirates are unacceptable for Xpert Xpress SARS-CoV-2/FLU/RSV testing.  Fact Sheet for Patients: EntrepreneurPulse.com.au  Fact Sheet for Healthcare Providers: IncredibleEmployment.be  This test is not yet approved or cleared by the [place] FDA and has been authorized for detection and/or diagnosis  of SARS-CoV-2 by FDA under an Emergency Use  Authorization (EUA). This EUA will remain in effect (meaning this test can be used) for the duration of the COVID-19 declaration under Section 564(b)(1) of the Act, 21 U.S.C. section 360bbb-3(b)(1), unless the authorization is terminated or revoked.  Performed at [facility], [place] [address], [place], [place] [address]     Procedures and diagnostic studies:  EEG  Result Date: 03/09/2020 Lora Havens, MD     03/09/2020  9:04 AM Patient Name: [name] MRN: [id number] Epilepsy Attending: Lora Havens Referring Physician/Provider:  Dr Lesleigh Noe Date: 03/08/2020 Duration: 27.32 mins Patient history: 49 year old female with history of seizures who presented with increasing resting tremors and jerks.  EEG to evaluate for seizures. Level of alertness: Awake AEDs during EEG study: Keppra, Topamax, gabapentin Technical aspects: This EEG study was done with scalp electrodes positioned according to the 10-20 International system of electrode placement. Electrical activity was acquired at a sampling rate of 500Hz  and reviewed with a high frequency filter of 70Hz  and a low frequency filter of 1Hz . EEG data were recorded continuously and digitally stored. Description: The posterior dominant rhythm consists of 7.5 Hz activity of moderate voltage (25-35 uV) seen predominantly in posterior head regions, symmetric and reactive to eye opening and eye closing. EEG showed continuous generalized 5 to 6 Hz theta as well as intermittent generalized 2 to 3 Hz delta slowing. Physiologic photic driving was not seen during photic stimulation.  Hyperventilation was not performed.   ABNORMALITY -Continuous slow, generalized IMPRESSION: This study is suggestive of mild diffuse encephalopathy, nonspecific etiology. No seizures or epileptiform discharges were seen throughout the recording. Lora Havens   MR BRAIN WO CONTRAST  Result Date: 03/09/2020 CLINICAL DATA:  Seizure. EXAM: MRI HEAD WITHOUT  CONTRAST TECHNIQUE: Multiplanar, multiecho pulse sequences of the brain and surrounding structures were obtained without intravenous contrast. COMPARISON:  CT head 03/01/2011 FINDINGS: Brain: Ventricle size and cerebral volume normal. Negative for acute infarct, hemorrhage, mass. Normal white matter. Mesial temporal lobe normal in signal and volume bilaterally. Vascular: Normal arterial flow voids. 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 There appears to be prior cranioplasty in the posterior parietal lobe in the midline. Sinuses/Orbits: Paranasal sinuses clear.  Negative orbit Other: None IMPRESSION: No significant intracranial abnormality. Scaphocephaly. Electronically Signed   By: Franchot Gallo M.D.   On: 03/09/2020 11:23               LOS: 1 day   Michiel Sivley  [facility]   Pager on [email or web address]. If 7PM-7AM, please contact night-coverage at [email or web address]     03/09/2020, 2:37 PM

## 2020-03-09 NOTE — Plan of Care (Signed)
  Problem: Health Behavior/Discharge Planning: Goal: Ability to manage health-related needs will improve Outcome: Progressing   Problem: Clinical Measurements: Goal: Ability to maintain clinical measurements within normal limits will improve Outcome: Progressing   

## 2020-03-09 NOTE — Progress Notes (Signed)
SWAT RN in MRI with patient, patient feels anxious. Gave 1mg  prior to leaving unit for claustrobia. Patient requests stronger dose to get through the MRI. Mal Misty, MD paged to notify about patient's condition.

## 2020-03-09 NOTE — Progress Notes (Signed)
Patient is being treated for a UTI and patient stated vaginal discomfort and would like cream to soothe the area. Paged on call MD.

## 2020-03-09 NOTE — Progress Notes (Signed)
Patient has arrived back onto the unit from MRI. Patient's mentation is lethargic secondary to 2mg  IV ativan given for sedation. RR and VS is within baseline. Will continue to monitor.

## 2020-03-09 NOTE — Progress Notes (Signed)
Inpatient Diabetes Program Recommendations  AACE/ADA: New Consensus Statement on Inpatient Glycemic Control   Target Ranges:  Prepandial:   less than 140 mg/dL      Peak postprandial:   less than 180 mg/dL (1-2 hours)      Critically ill patients:  140 - 180 mg/dL   Lab Results  Component Value Date   GLUCAP 376 (H) 03/09/2020   HGBA1C 11.9 (H) 03/08/2020    Review of Glycemic Control Results for [name], [place] (MRN [id number]) as of 03/09/2020 15:35  Ref. Range 03/09/2020 05:32 03/09/2020 11:29  Glucose-Capillary Latest Ref Range: 70 - 99 mg/dL 345 (H) 376 (H)  Diabetes history: DM 2 Outpatient Diabetes medications:  Lantus 75 units q HS, Novolog 75 units q AM, Novolog 25 units with lunch, Novolog 75 units with supper Current orders for Inpatient glycemic control:  Lantus 60 units q HS, Novolog moderate tid with meals and HS, Novolog 15 units tid with meals  Inpatient Diabetes Program Recommendations:    Please consider increasing Lantus to 80 units q HS and increase Novolog meal coverage to 20 units tid with meals.  Attempted to discuss A1C with patient this afternoon.  She was taking a nap.  Will follow-up on 03/10/20.   Thanks, Adah Perl, RN, BC-ADM Inpatient Diabetes Coordinator Pager [phone] (8a-5p)

## 2020-03-09 NOTE — Procedures (Signed)
Patient Name: [name]  MRN: [id number]  Epilepsy Attending: Lora Havens  Referring Physician/Provider:  Dr Lesleigh Noe Date: 03/08/2020 Duration: 27.32 mins  Patient history: 49 year old female with history of seizures who presented with increasing resting tremors and jerks.  EEG to evaluate for seizures.  Level of alertness: Awake  AEDs during EEG study: Keppra, Topamax, gabapentin  Technical aspects: This EEG study was done with scalp electrodes positioned according to the 10-20 International system of electrode placement. Electrical activity was acquired at a sampling rate of 500Hz  and reviewed with a high frequency filter of 70Hz  and a low frequency filter of 1Hz . EEG data were recorded continuously and digitally stored.   Description: The posterior dominant rhythm consists of 7.5 Hz activity of moderate voltage (25-35 uV) seen predominantly in posterior head regions, symmetric and reactive to eye opening and eye closing. EEG showed continuous generalized 5 to 6 Hz theta as well as intermittent generalized 2 to 3 Hz delta slowing. Physiologic photic driving was not seen during photic stimulation.  Hyperventilation was not performed.     ABNORMALITY -Continuous slow, generalized  IMPRESSION: This study is suggestive of mild diffuse encephalopathy, nonspecific etiology. No seizures or epileptiform discharges were seen throughout the recording.  Dorine Duffey [name]

## 2020-03-10 DIAGNOSIS — N390 Urinary tract infection, site not specified: Secondary | ICD-10-CM | POA: Diagnosis not present

## 2020-03-10 DIAGNOSIS — E118 Type 2 diabetes mellitus with unspecified complications: Secondary | ICD-10-CM | POA: Diagnosis not present

## 2020-03-10 DIAGNOSIS — E1165 Type 2 diabetes mellitus with hyperglycemia: Secondary | ICD-10-CM | POA: Diagnosis not present

## 2020-03-10 DIAGNOSIS — N179 Acute kidney failure, unspecified: Secondary | ICD-10-CM | POA: Diagnosis not present

## 2020-03-10 DIAGNOSIS — R300 Dysuria: Secondary | ICD-10-CM | POA: Diagnosis not present

## 2020-03-10 DIAGNOSIS — F418 Other specified anxiety disorders: Secondary | ICD-10-CM | POA: Diagnosis not present

## 2020-03-10 LAB — BASIC METABOLIC PANEL
Anion gap: 8 (ref 5–15)
BUN: 16 mg/dL (ref 6–20)
CO2: 23 mmol/L (ref 22–32)
Calcium: 8.2 mg/dL — ABNORMAL LOW (ref 8.9–10.3)
Chloride: 104 mmol/L (ref 98–111)
Creatinine, Ser: 0.94 mg/dL (ref 0.44–1.00)
GFR, Estimated: >60 mL/min (ref >60)
Glucose, Bld: 313 mg/dL — ABNORMAL HIGH (ref 70–99)
Potassium: 3.8 mmol/L (ref 3.5–5.1)
Sodium: 135 mmol/L (ref 135–145)

## 2020-03-10 LAB — CBC
HCT: 36.1 % (ref 36.0–46.0)
Hemoglobin: 11.6 g/dL — ABNORMAL LOW (ref 12.0–15.0)
MCH: 30.8 pg (ref 26.0–34.0)
MCHC: 32.1 g/dL (ref 30.0–36.0)
MCV: 95.8 fL (ref 80.0–100.0)
Platelets: 133 10*3/uL — ABNORMAL LOW (ref 150–400)
RBC: 3.77 MIL/uL — ABNORMAL LOW (ref 3.87–5.11)
RDW: 13.3 % (ref 11.5–15.5)
WBC: 6 10*3/uL (ref 4.0–10.5)
nRBC: 0 % (ref 0.0–0.2)

## 2020-03-10 LAB — GLUCOSE, CAPILLARY
Glucose-Capillary: 320 mg/dL — ABNORMAL HIGH (ref 70–99)
Glucose-Capillary: 414 mg/dL — ABNORMAL HIGH (ref 70–99)
Glucose-Capillary: 351 mg/dL — ABNORMAL HIGH (ref 70–99)
Glucose-Capillary: 280 mg/dL — ABNORMAL HIGH (ref 70–99)

## 2020-03-10 MED ORDER — CEPHALEXIN 250 MG PO CAPS
500.0000 mg | ORAL_CAPSULE | Freq: Three times a day (TID) | ORAL | Status: DC
  Administered 2020-03-10 – 2020-03-12 (×6): 500 mg via ORAL
  Filled 2020-03-10 (×6): qty 2

## 2020-03-10 MED ORDER — ENOXAPARIN SODIUM 80 MG/0.8ML ~~LOC~~ SOLN
80.0000 mg | SUBCUTANEOUS | Status: DC
  Administered 2020-03-10 – 2020-03-11 (×2): 80 mg via SUBCUTANEOUS
  Filled 2020-03-10 (×2): qty 0.8

## 2020-03-10 MED ORDER — INSULIN ASPART 100 UNIT/ML ~~LOC~~ SOLN
25.0000 [IU] | Freq: Three times a day (TID) | SUBCUTANEOUS | Status: DC
  Administered 2020-03-10 – 2020-03-11 (×5): 25 [IU] via SUBCUTANEOUS

## 2020-03-10 MED ORDER — INSULIN GLARGINE 100 UNIT/ML ~~LOC~~ SOLN
90.0000 [IU] | Freq: Every day | SUBCUTANEOUS | Status: DC
  Administered 2020-03-10 – 2020-03-11 (×2): 90 [IU] via SUBCUTANEOUS
  Filled 2020-03-10 (×3): qty 0.9

## 2020-03-10 NOTE — Progress Notes (Addendum)
Progress Note    SHILYNN [name]  BHA:[id number] DOB: 1970/11/19  DOA: 03/07/2020 PCP: Pecolia Ades, NP      Brief Narrative:    Medical records reviewed and are as summarized below:  [name] is a 49 y.o. female with medical history significant for depression, anxiety, seizures, chronic pain, insulin-dependent diabetes mellitus, and hypertension, who presented to the emergency department for evaluation of hyperglycemia, dysuria, and general malaise.  She was admitted to the hospital for acute kidney injury, UTI and severe hyperglycemia.  She was treated with IV fluids and IV Rocephin.  Insulin dose was increased because of persistent hyperglycemia.  She complained of frequent jerking movements of the upper extremities and she was concerned about seizures.  Neurologist was consulted to assist with management.  MRI brain and EEG were unremarkable.  Neurologist recommended outpatient follow-up.      Assessment/Plan:   Principal Problem:   AKI (acute kidney injury) ([place]) Active Problems:   Diabetes mellitus type 2 with complications, uncontrolled ([place])   Sleep apnea   Hypothyroidism   Chronic pain   Depression with anxiety   Seizures ([place])   Acute lower UTI    Body mass index is 57.6 kg/m.  (Morbid obesity)   AKI: Resolved.  Discontinue IV fluids.  Type II DM with severe hyperglycemia: Hemoglobin A1c is 11.9.  She said she takes NovoLog 75 units in the morning, 25 units at lunch and 75 units at night.  She says she no longer takes Lantus 75 units nightly because it was discontinued by her doctor.  She said her HbA1c had gone down to 7.1 at some point.  Chart review showed hemoglobin A1c was 10.9  11 months ago and hemoglobin A1c was 8.7 2 years ago.  Glucose levels are still uncontrolled with glucose up to 414 today.  Increase Lantus from 18 units to 90 units nightly.  Increase NovoLog from 15 units 3 times daily to 25 units 3 times daily.  Continue  NovoLog as needed per sliding scale.  Monitor glucose levels closely.  Acute lower UTI: I called the lab to follow-up on urine culture results but unfortunately urine was not cultured because he did not get a specimen for culture.  Discontinue IV Rocephin and switch to Keflex.  Seizure disorder: MRI brain did not show any acute abnormality.  EEG was suggestive of mild diffuse encephalopathy but no evidence of seizures or epileptiform discharges.  Continue Keppra.  Appreciate input from neurologist.  Depression and anxiety: Continue antidepressants  Generalized weakness/debility: Consult PT.  Other comorbidities include chronic pain and hypothyroidism.  Possible discharge to home tomorrow.   Diet Order            Diet heart healthy/carb modified Room service appropriate? Yes; Fluid consistency: Thin  Diet effective now                    Consultants:  Neurology  Procedures:  EEG on 03/09/2020    Medications:   . amitriptyline  50 mg Oral QHS  . ARIPiprazole  5 mg Oral Daily  . atorvastatin  20 mg Oral Daily  . baclofen  5 mg Oral BID  . cephALEXin  500 mg Oral Q8H  . clotrimazole  1 Applicatorful Vaginal QHS  . enoxaparin (LOVENOX) injection  40 mg Subcutaneous Q24H  . fentaNYL  1 patch Transdermal Q72H  . gabapentin  300 mg Oral BID  . insulin aspart  0-15 Units Subcutaneous  TID WC  . insulin aspart  0-5 Units Subcutaneous QHS  . insulin aspart  25 Units Subcutaneous TID WC  . insulin glargine  90 Units Subcutaneous QHS  . levETIRAcetam  750 mg Oral BID  . levothyroxine  150 mcg Oral Q0600  . lidocaine  4 patch Transdermal Q24H  . pantoprazole  40 mg Oral Daily  . topiramate  200 mg Oral BID   Continuous Infusions:    Anti-infectives (From admission, onward)   Start     Dose/Rate Route Frequency Ordered Stop   03/10/20 1400  cephALEXin (KEFLEX) capsule 500 mg        500 mg Oral Every 8 hours 03/10/20 1224 03/14/20 1359   03/08/20 0000  cefTRIAXone  (ROCEPHIN) 1 g in sodium chloride 0.9 % 100 mL IVPB  Status:  Discontinued        1 g 200 mL/hr over 30 Minutes Intravenous Every 24 hours 03/07/20 2309 03/10/20 1224   03/07/20 1700  cefTRIAXone (ROCEPHIN) 1 g in sodium chloride 0.9 % 100 mL IVPB  Status:  Discontinued        1 g 200 mL/hr over 30 Minutes Intravenous  Once 03/07/20 1646 03/07/20 1647   03/07/20 1700  ciprofloxacin (CIPRO) tablet 500 mg        500 mg Oral  Once 03/07/20 1655 03/07/20 1705             Family Communication/Anticipated D/C date and plan/Code Status   DVT prophylaxis: enoxaparin (LOVENOX) injection 40 mg Start: 03/08/20 0000     Code Status: Full Code  Family Communication: None Disposition Plan:    Status is: Observation  The patient will require care spanning > 2 midnights and should be moved to inpatient because: IV treatments appropriate due to intensity of illness or inability to take PO and Inpatient level of care appropriate due to severity of illness  Dispo: The patient is from: Home              Anticipated d/c is to: Home              Anticipated d/c date is: 1 day              Patient currently is not medically stable to d/c.           Subjective:   Interval events noted.  She complains of generalized weakness.  She still complains of tremors mainly in the right upper extremities.  She has not been able to get out of bed.  Objective:    Vitals:   03/10/20 0003 03/10/20 0420 03/10/20 0722 03/10/20 1059  BP: (!) 141/67 94/65 121/69 113/65  Pulse: 81 77 81 90  Resp: 17 16 20 20   Temp: 98.2 F (36.8 C) 98.4 F (36.9 C) 98.3 F (36.8 C) 98.2 F (36.8 C)  TempSrc: Oral Oral Oral Oral  SpO2: 99% 94% 94% 96%  Weight: (!) 157 kg     Height:       No data found.   Intake/Output Summary (Last 24 hours) at 03/10/2020 1224 Last data filed at 03/10/2020 0931 Gross per 24 hour  Intake 1560.77 ml  Output 1200 ml  Net 360.77 ml   Filed Weights   03/08/20 0048  03/09/20 0021 03/10/20 0003  Weight: (!) 156.7 kg (!) 156.2 kg (!) 157 kg    Exam:  GEN: NAD SKIN: Warm and dry EYES: EOMI ENT: MMM CV: RRR PULM: CTA B ABD: soft, obese, NT, +BS CNS:  AAO x 3, right foot drop.  Tremors of right upper extremity. EXT: No edema or tenderness     Data Reviewed:   I have personally reviewed following labs and imaging studies:  Labs: Labs show the following:   Basic Metabolic Panel: Recent Labs  Lab 03/07/20 0223 03/07/20 1628 03/08/20 0303 03/09/20 0221 03/10/20 0233  NA 131* 130* 133* 133* 135  K 3.8 3.9 4.0 3.8 3.8  CL 100 97* 98 105 104  CO2 19* 19* 22 20* 23  GLUCOSE 304* 388* 373* 324* 313*  BUN 25* 35* 40* 28* 16  CREATININE 1.95* 2.27* 1.97* 1.18* 0.94  CALCIUM 9.0 9.0 8.2* 7.9* 8.2*   GFR Estimated Creatinine Clearance: 108.4 mL/min (by C-G formula based on SCr of 0.94 mg/dL). Liver Function Tests: No results for input(s): AST, ALT, ALKPHOS, BILITOT, PROT, ALBUMIN in the last 168 hours. No results for input(s): LIPASE, AMYLASE in the last 168 hours. No results for input(s): AMMONIA in the last 168 hours. Coagulation profile No results for input(s): INR, PROTIME in the last 168 hours.  CBC: Recent Labs  Lab 03/07/20 0223 03/08/20 0303 03/09/20 0221 03/10/20 0233  WBC 11.6* 8.7 6.3 6.0  HGB 15.0 12.5 11.8* 11.6*  HCT 44.5 38.6 34.7* 36.1  MCV 93.3 93.2 93.0 95.8  PLT 233 180 151 133*   Cardiac Enzymes: No results for input(s): CKTOTAL, CKMB, CKMBINDEX, TROPONINI in the last 168 hours. BNP (last 3 results) No results for input(s): PROBNP in the last 8760 hours. CBG: Recent Labs  Lab 03/09/20 1129 03/09/20 1629 03/09/20 2037 03/10/20 0528 03/10/20 1101  GLUCAP 376* 299* 357* 320* 414*   D-Dimer: No results for input(s): DDIMER in the last 72 hours. Hgb A1c: Recent Labs    03/08/20 0303  HGBA1C 11.9*   Lipid Profile: No results for input(s): CHOL, HDL, LDLCALC, TRIG, CHOLHDL, LDLDIRECT in the last  72 hours. Thyroid function studies: No results for input(s): TSH, T4TOTAL, T3FREE, THYROIDAB in the last 72 hours.  Invalid input(s): FREET3 Anemia work up: No results for input(s): VITAMINB12, FOLATE, FERRITIN, TIBC, IRON, RETICCTPCT in the last 72 hours. Sepsis Labs: Recent Labs  Lab 03/07/20 0223 03/08/20 0303 03/09/20 0221 03/10/20 0233  WBC 11.6* 8.7 6.3 6.0    Microbiology Recent Results (from the past 240 hour(s))  Resp Panel by RT-PCR (Flu A&B, Covid) Nasopharyngeal Swab     Status: None   Collection Time: 03/07/20  2:30 AM   Specimen: Nasopharyngeal Swab; Nasopharyngeal(NP) swabs in vial transport medium  Result Value Ref Range Status   SARS Coronavirus 2 by RT PCR NEGATIVE NEGATIVE Final    Comment: (NOTE) SARS-CoV-2 target nucleic acids are NOT DETECTED.  The SARS-CoV-2 RNA is generally detectable in upper respiratory specimens during the acute phase of infection. The lowest concentration of SARS-CoV-2 viral copies this assay can detect is 138 copies/mL. A negative result does not preclude SARS-Cov-2 infection and should not be used as the sole basis for treatment or other patient management decisions. A negative result may occur with  improper specimen collection/handling, submission of specimen other than nasopharyngeal swab, presence of viral mutation(s) within the areas targeted by this assay, and inadequate number of viral copies(<138 copies/mL). A negative result must be combined with clinical observations, patient history, and epidemiological information. The expected result is Negative.  Fact Sheet for Patients:  EntrepreneurPulse.com.au  Fact Sheet for Healthcare Providers:  IncredibleEmployment.be  This test is no t yet approved or cleared by the [place] and  has been authorized for detection and/or diagnosis of SARS-CoV-2 by FDA under an Emergency Use Authorization (EUA). This EUA will remain  in  effect (meaning this test can be used) for the duration of the COVID-19 declaration under Section 564(b)(1) of the Act, 21 U.S.C.section 360bbb-3(b)(1), unless the authorization is terminated  or revoked sooner.       Influenza A by PCR NEGATIVE NEGATIVE Final   Influenza B by PCR NEGATIVE NEGATIVE Final    Comment: (NOTE) The Xpert Xpress SARS-CoV-2/FLU/RSV plus assay is intended as an aid in the diagnosis of influenza from Nasopharyngeal swab specimens and should not be used as a sole basis for treatment. Nasal washings and aspirates are unacceptable for Xpert Xpress SARS-CoV-2/FLU/RSV testing.  Fact Sheet for Patients: EntrepreneurPulse.com.au  Fact Sheet for Healthcare Providers: IncredibleEmployment.be  This test is not yet approved or cleared by the [place] FDA and has been authorized for detection and/or diagnosis of SARS-CoV-2 by FDA under an Emergency Use Authorization (EUA). This EUA will remain in effect (meaning this test can be used) for the duration of the COVID-19 declaration under Section 564(b)(1) of the Act, 21 U.S.C. section 360bbb-3(b)(1), unless the authorization is terminated or revoked.  Performed at [facility], [place] [address] [place], [place] [address]     Procedures and diagnostic studies:  EEG  Result Date: 03/09/2020 Lora Havens, MD     03/09/2020  9:04 AM Patient Name: [name] MRN: [id number] Epilepsy Attending: Lora Havens Referring Physician/Provider:  Dr Lesleigh Noe Date: 03/08/2020 Duration: 27.32 mins Patient history: 49 year old female with history of seizures who presented with increasing resting tremors and jerks.  EEG to evaluate for seizures. Level of alertness: Awake AEDs during EEG study: Keppra, Topamax, gabapentin Technical aspects: This EEG study was done with scalp electrodes positioned according to the 10-20 International system of electrode placement.  Electrical activity was acquired at a sampling rate of 500Hz  and reviewed with a high frequency filter of 70Hz  and a low frequency filter of 1Hz . EEG data were recorded continuously and digitally stored. Description: The posterior dominant rhythm consists of 7.5 Hz activity of moderate voltage (25-35 uV) seen predominantly in posterior head regions, symmetric and reactive to eye opening and eye closing. EEG showed continuous generalized 5 to 6 Hz theta as well as intermittent generalized 2 to 3 Hz delta slowing. Physiologic photic driving was not seen during photic stimulation.  Hyperventilation was not performed.   ABNORMALITY -Continuous slow, generalized IMPRESSION: This study is suggestive of mild diffuse encephalopathy, nonspecific etiology. No seizures or epileptiform discharges were seen throughout the recording. Lora Havens   MR BRAIN WO CONTRAST  Result Date: 03/09/2020 CLINICAL DATA:  Seizure. EXAM: MRI HEAD WITHOUT CONTRAST TECHNIQUE: Multiplanar, multiecho pulse sequences of the brain and surrounding structures were obtained without intravenous contrast. COMPARISON:  CT head 03/01/2011 FINDINGS: Brain: Ventricle size and cerebral volume normal. Negative for acute infarct, hemorrhage, mass. Normal white matter. Mesial temporal lobe normal in signal and volume bilaterally. Vascular: Normal arterial flow voids. 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 There appears to be prior cranioplasty in the posterior parietal lobe in the midline. Sinuses/Orbits: Paranasal sinuses clear.  Negative orbit Other: None IMPRESSION: No significant intracranial abnormality. Scaphocephaly. Electronically Signed   By: Franchot Gallo M.D.   On: 03/09/2020 11:23               LOS: 2 days   Jhordan Mckibben  [facility]   Pager on [email or web address]. If 7PM-7AM, please contact night-coverage at [email or web address]     03/10/2020, 12:24 PM

## 2020-03-10 NOTE — Progress Notes (Addendum)
Inpatient Diabetes Program Recommendations  AACE/ADA: New Consensus Statement on Inpatient Glycemic Control (2015)  Target Ranges:  Prepandial:   less than 140 mg/dL      Peak postprandial:   less than 180 mg/dL (1-2 hours)      Critically ill patients:  140 - 180 mg/dL   Results for [name], [name] (MRN [id number]) as of 03/10/2020 08:46  Ref. Range 03/09/2020 01:41 03/09/2020 05:32 03/09/2020 11:29 03/09/2020 16:29 03/09/2020 20:37  Glucose-Capillary Latest Ref Range: 70 - 99 mg/dL 305 (H)   60 units LANTUS _0 :14pm 345 (H)  11 units NOVOLOG _1 :30am  15 units NOVOLOG _2   376 (H)  15 units NOVOLOG  299 (H)  23 units NOVOLOG  357 (H)  5 units NOVOLOG  80 units LANTUS   Results for [name], [name] (MRN [id number]) as of 03/10/2020 08:46  Ref. Range 03/10/2020 05:28  Glucose-Capillary Latest Ref Range: 70 - 99 mg/dL 320 (H)  11 units NOVOLOG _3   15 units NOVOLOG _4    Results for [name], [name] (MRN [id number]) as of 03/10/2020 08:46  Ref. Range 03/08/2020 03:03  Hemoglobin A1C Latest Ref Range: 4.8 - 5.6 % 11.9 (H)  (294 mg/dl)    Admit with: Hyperglycemia/ Acute Kidney Injury  History: DM  Home DM Meds: Lantus 75 units QHS       Novolog 75 units Breakfast/ 25 units Lunch/ 75 units Dinner       Januvia 50 mg Daily       Trulicity 1.5 mg Qweek  Current Orders: Lantus 80 units QHS      Novolog Moderate Correction Scale/ SSI (0-15 units) TID AC + HS      Novolog 15 units TID with meals    Endocrinologist: Dr. Chalmers Cater with [facility]    MD- Please consider the following in-hospital insulin adjustments:  1. CBG 320 this AM--Please increase Lantus to 90 units QHS  2. Increase Novolog Meal Coverage to 25 units TID with meals (takes larger doses at home)  3. Likely needs to start Lantus again at home until she can be seen by her ENDO for follow up    Addendum 11:30am--Met w/ pt at bedside this AM.  States she was taken  off Lantus by Dr. Chalmers Cater about 3 months ago and started on large doses of Novolog--A1c at last visit with Balan was 7.1%--Doesn't know why CBGs have been so high. Takes all meds as prescribed and has meds at home.  Checks CBGs often at home and told me she is very concerned about why her CBGs have been so high.  Discussed w/ pt current A1c of 11.9%--pt has meds at home and has been taking meds.  Discussed w/ pt that we may need to d/c her back home on the Lantus b/c she has been requiring such large doses of both Lantus and Novolog in the hospital setting.  Pt hesitant to take Lantus and Novolog at the same time.  Tried to call pt's ENDO this afternoon, however, the ENDO office closes at 12pm.      --Will follow patient during hospitalization--  Wyn Quaker RN, MSN, CDE Diabetes Coordinator Inpatient Glycemic Control Team Team Pager: [phone] (8a-5p)

## 2020-03-10 NOTE — Progress Notes (Signed)
Pts CBG 414, MD paged made aware.

## 2020-03-10 NOTE — Evaluation (Signed)
Physical Therapy Evaluation Patient Details Name: [name] MRN: [id number] DOB: 1971/03/12 Today's Date: 03/10/2020   History of Present Illness  49 y.o. female with medical history significant for depression, anxiety, seizures, chronic pain, insulin-dependent diabetes mellitus, and hypertension, who presented to the emergency department for evaluation of hyperglycemia, dysuria, and general malaise. Pt found to have AKI with acute lower UTI.  Clinical Impression  Pt presents to PT with deficits in endurance, power, strength, gait, and functional mobility. Pt reports cardiopulmonary endurance and SOB as her greatest deficits right now, feeling SOB with minimal ambulation despite stable oxygen saturation while on room air. Pt also requires some assistance to mobilize in bed at this time but is able to transfer and ambulate short household distances without physical assistance. Pt will benefit from continued acute PT POC to improve mobility quality and to reduce falls risk. PT recommends discharge home with HHPT and intermittent assistance from her significant other.    Follow Up Recommendations Home health PT;Supervision for mobility/OOB    Equipment Recommendations  None recommended by PT (pt owns necessary DME)    Recommendations for Other Services       Precautions / Restrictions Precautions Precautions: Fall Precaution Comments: drop foot RLE, wears AFO Restrictions Weight Bearing Restrictions: No      Mobility  Bed Mobility Overal bed mobility: Needs Assistance Bed Mobility: Supine to Sit;Sit to Supine     Supine to sit: Min assist Sit to supine: Supervision   General bed mobility comments: hand hold to pull into sitting    Transfers Overall transfer level: Needs assistance Equipment used: Rolling walker (2 wheeled) Transfers: Sit to/from Stand Sit to Stand: Supervision            Ambulation/Gait Ambulation/Gait assistance: Min guard Gait Distance  (Feet): 40 Feet Assistive device: Rolling walker (2 wheeled) Gait Pattern/deviations: Step-to pattern Gait velocity: reduced Gait velocity interpretation: <1.8 ft/sec, indicate of risk for recurrent falls General Gait Details: short step-to gait, reduced step length and widened BOS  Stairs            Wheelchair Mobility    Modified Rankin (Stroke Patients Only)       Balance Overall balance assessment: Needs assistance Sitting-balance support: No upper extremity supported;Feet supported Sitting balance-Leahy Scale: Good Sitting balance - Comments: dons socks and shoes   Standing balance support: Bilateral upper extremity supported Standing balance-Leahy Scale: Poor Standing balance comment: reliant on UE support of RW                             Pertinent Vitals/Pain Pain Assessment: Faces Faces Pain Scale: Hurts little more Pain Location: back and knees Pain Descriptors / Indicators: Grimacing Pain Intervention(s): Monitored during session    Home Living Family/patient expects to be discharged to:: Private residence Living Arrangements: Spouse/significant other Available Help at Discharge: Family;Available PRN/intermittently (works days) Type of Home: House Home Access: Stairs to enter Entrance Stairs-Rails: None Technical brewer of Steps: 2 Home Layout: One level Home Equipment: Environmental consultant - 2 wheels;Walker - 4 wheels;Cane - single point;Bedside commode      Prior Function Level of Independence: Independent with assistive device(s)         Comments: pt reports ambulating with cane, gradual decline in activity tolerance over the last month limiting her to short household distances. Previously she was able to walk for 2 miles     Hand Dominance        Extremity/Trunk  Assessment   Upper Extremity Assessment Upper Extremity Assessment: Overall WFL for tasks assessed    Lower Extremity Assessment Lower Extremity Assessment: Generalized  weakness    Cervical / Trunk Assessment Cervical / Trunk Assessment: Other exceptions Cervical / Trunk Exceptions: excess body habitus  Communication   Communication: No difficulties  Cognition Arousal/Alertness: Awake/alert Behavior During Therapy: WFL for tasks assessed/performed Overall Cognitive Status: Within Functional Limits for tasks assessed                                        General Comments General comments (skin integrity, edema, etc.): pt initially on 2L [place] upon arrival with sats at 99-100%. PT weans to RA with sats in high 90s for all mobility. Pt's RR does increase with ambulation and she reports SOB however sats remain stable. Pt requests to replace 2L [place] at the end of session    Exercises     Assessment/Plan    PT Assessment Patient needs continued PT services  PT Problem List Decreased strength;Decreased activity tolerance;Decreased balance;Decreased mobility       PT Treatment Interventions DME instruction;Gait training;Stair training;Functional mobility training;Therapeutic activities;Therapeutic exercise;Balance training;Neuromuscular re-education;Patient/family education    PT Goals (Current goals can be found in the Care Plan section)  Acute Rehab PT Goals Patient Stated Goal: to improve activity tolerance and regain independence in community mobility PT Goal Formulation: With patient Time For Goal Achievement: 03/24/20 Potential to Achieve Goals: Good    Frequency Min 3X/week   Barriers to discharge        Co-evaluation               AM-PAC PT "6 Clicks" Mobility  Outcome Measure Help needed turning from your back to your side while in a flat bed without using bedrails?: A Little Help needed moving from lying on your back to sitting on the side of a flat bed without using bedrails?: A Little Help needed moving to and from a bed to a chair (including a wheelchair)?: A Little Help needed standing up from a chair using  your arms (e.g., wheelchair or bedside chair)?: A Little Help needed to walk in hospital room?: A Little Help needed climbing 3-5 steps with a railing? : A Little 6 Click Score: 18    End of Session Equipment Utilized During Treatment: Other (comment) (AFO R foot) Activity Tolerance: Patient limited by fatigue Patient left: in bed;with call bell/phone within reach;with bed alarm set Nurse Communication: Mobility status PT Visit Diagnosis: Other abnormalities of gait and mobility (R26.89)    Time: 2263-3354 PT Time Calculation (min) (ACUTE ONLY): 25 min   Charges:   PT Evaluation $PT Eval Low Complexity: 1 Low PT Treatments $Therapeutic Activity: 8-22 mins        Zenaida Niece, PT, DPT Acute Rehabilitation Pager: [phone]   Zenaida Niece 03/10/2020, 4:57 PM

## 2020-03-11 DIAGNOSIS — N390 Urinary tract infection, site not specified: Secondary | ICD-10-CM | POA: Diagnosis not present

## 2020-03-11 DIAGNOSIS — R300 Dysuria: Secondary | ICD-10-CM | POA: Diagnosis not present

## 2020-03-11 DIAGNOSIS — N179 Acute kidney failure, unspecified: Secondary | ICD-10-CM | POA: Diagnosis not present

## 2020-03-11 DIAGNOSIS — F418 Other specified anxiety disorders: Secondary | ICD-10-CM | POA: Diagnosis not present

## 2020-03-11 DIAGNOSIS — E1165 Type 2 diabetes mellitus with hyperglycemia: Secondary | ICD-10-CM | POA: Diagnosis not present

## 2020-03-11 LAB — GLUCOSE, CAPILLARY
Glucose-Capillary: 277 mg/dL — ABNORMAL HIGH (ref 70–99)
Glucose-Capillary: 292 mg/dL — ABNORMAL HIGH (ref 70–99)
Glucose-Capillary: 253 mg/dL — ABNORMAL HIGH (ref 70–99)
Glucose-Capillary: 315 mg/dL — ABNORMAL HIGH (ref 70–99)

## 2020-03-11 NOTE — Progress Notes (Signed)
PROGRESS NOTE    [name]  NLZ:[id number] DOB: May 31, 1970 DOA: 03/07/2020 PCP: Pecolia Ades, NP    Chief Complaint  Patient presents with  . Hyperglycemia    Brief Narrative:   49 year old lady with prior h/o anxiety, depression, insulin dependent DM, hypertension, seizures,  Chronic pain syndrome, OSA, presented to ED with AKI, UTI and uncontrolled DM with hyperglycemia. She also reports jerky movements of the right upper extremity and concerned about seizures.   Assessment & Plan:   Principal Problem:   AKI (acute kidney injury) ([place]) Active Problems:   Diabetes mellitus type 2 with complications, uncontrolled ([place])   Sleep apnea   Hypothyroidism   Chronic pain   Depression with anxiety   Seizures ([place])   Acute lower UTI   AKI  Resolved , probably from hyperglycemia.     Uncontrolled type 2 diabetes mellitus:  CBG (last 3)  Recent Labs    03/10/20 1559 03/10/20 2120 03/11/20 0550  GLUCAP 351* 280* 277*   A1c is 11.9.  Currently on lantus 90 units every night and novolog TIDAC 25 units Monitor cbgs on the new insulin regimen for another 24 hours before making changes.  In addition continue with SSI.    Acute UTI:  On keflex to complete the course.    Depression and anxiety:  Resume home meds.    Hypothyroidism:  Resume synthroid.    H/o seizures:  MRI brain does not show any intracranial abnormality.  EEG suggestive of mild diffuse encephalopathy, nonspecific etiology. No seizures or epileptiform discharges were seen throughout the recording. Jerking of the right upper extremity probably intentional tremors.  No changes in meds.  Continue with Keppra and topamax.    Hyperlipidemia:  Resume statin on discharge.    DVT prophylaxis: (Lovenox) Code Status: (Full code) Family Communication: none at bedside.  Disposition:   Status is: Inpatient  Remains inpatient appropriate because:Inpatient level of care appropriate due to  severity of illness, persistent tremors and dysuria.    Dispo: The patient is from: Home              Anticipated d/c is to: Home              Anticipated d/c date is: 1 day              Patient currently is not medically stable to d/c.       Consultants:   None.   Procedures: None.  Antimicrobials: Keflex.    Subjective: She does not want to be discharged today.  She reports she is not back to baseline yet, she still has burning urination and her tremors are bothering her.  She would like to be discharged tomorrow.   Objective: Vitals:   03/10/20 1557 03/10/20 2004 03/11/20 0019 03/11/20 0507  BP: 122/78 (!) 112/59 120/70 108/61  Pulse: 78 84 76 76  Resp: 20 18  12   Temp: (!) 97.5 F (36.4 C) 98.2 F (36.8 C) 98.2 F (36.8 C) 98.4 F (36.9 C)  TempSrc: Oral Oral Oral Oral  SpO2: 100% 100% 100% 93%  Weight:    (!) 163.3 kg  Height:        Intake/Output Summary (Last 24 hours) at 03/11/2020 1053 Last data filed at 03/11/2020 0020 Gross per 24 hour  Intake 954 ml  Output 1350 ml  Net -396 ml   Filed Weights   03/09/20 0021 03/10/20 0003 03/11/20 0507  Weight: (!) 156.2 kg (!) 157  kg (!) 163.3 kg    Examination:  General exam: morbidly obese lady, not in any apparent distress.  Respiratory system: Clear to auscultation. Respiratory effort normal. Cardiovascular system: S1 & S2 heard, RRR. No JVD, murmurs, . No pedal edema. Gastrointestinal system: Abdomen is nondistended, soft and nontender.Normal bowel sounds heard. Central nervous system: Alert and oriented. No focal neurological deficits. Has intentional tremors in the right hand.  Extremities: Symmetric 5 x 5 power. Skin: No rashes, lesions or ulcers Psychiatry: Mood & affect appropriate.     Data Reviewed: I have personally reviewed following labs and imaging studies  CBC: Recent Labs  Lab 03/07/20 0223 03/08/20 0303 03/09/20 0221 03/10/20 0233  WBC 11.6* 8.7 6.3 6.0  HGB 15.0 12.5 11.8*  11.6*  HCT 44.5 38.6 34.7* 36.1  MCV 93.3 93.2 93.0 95.8  PLT 233 180 151 133*    Basic Metabolic Panel: Recent Labs  Lab 03/07/20 0223 03/07/20 1628 03/08/20 0303 03/09/20 0221 03/10/20 0233  NA 131* 130* 133* 133* 135  K 3.8 3.9 4.0 3.8 3.8  CL 100 97* 98 105 104  CO2 19* 19* 22 20* 23  GLUCOSE 304* 388* 373* 324* 313*  BUN 25* 35* 40* 28* 16  CREATININE 1.95* 2.27* 1.97* 1.18* 0.94  CALCIUM 9.0 9.0 8.2* 7.9* 8.2*    GFR: Estimated Creatinine Clearance: 111.2 mL/min (by C-G formula based on SCr of 0.94 mg/dL).  Liver Function Tests: No results for input(s): AST, ALT, ALKPHOS, BILITOT, PROT, ALBUMIN in the last 168 hours.  CBG: Recent Labs  Lab 03/10/20 0528 03/10/20 1101 03/10/20 1559 03/10/20 2120 03/11/20 0550  GLUCAP 320* 414* 351* 280* 277*     Recent Results (from the past 240 hour(s))  Resp Panel by RT-PCR (Flu A&B, Covid) Nasopharyngeal Swab     Status: None   Collection Time: 03/07/20  2:30 AM   Specimen: Nasopharyngeal Swab; Nasopharyngeal(NP) swabs in vial transport medium  Result Value Ref Range Status   SARS Coronavirus 2 by RT PCR NEGATIVE NEGATIVE Final    Comment: (NOTE) SARS-CoV-2 target nucleic acids are NOT DETECTED.  The SARS-CoV-2 RNA is generally detectable in upper respiratory specimens during the acute phase of infection. The lowest concentration of SARS-CoV-2 viral copies this assay can detect is 138 copies/mL. A negative result does not preclude SARS-Cov-2 infection and should not be used as the sole basis for treatment or other patient management decisions. A negative result may occur with  improper specimen collection/handling, submission of specimen other than nasopharyngeal swab, presence of viral mutation(s) within the areas targeted by this assay, and inadequate number of viral copies(<138 copies/mL). A negative result must be combined with clinical observations, patient history, and epidemiological information. The  expected result is Negative.  Fact Sheet for Patients:  EntrepreneurPulse.com.au  Fact Sheet for Healthcare Providers:  IncredibleEmployment.be  This test is no t yet approved or cleared by the [place] FDA and  has been authorized for detection and/or diagnosis of SARS-CoV-2 by FDA under an Emergency Use Authorization (EUA). This EUA will remain  in effect (meaning this test can be used) for the duration of the COVID-19 declaration under Section 564(b)(1) of the Act, 21 U.S.C.section 360bbb-3(b)(1), unless the authorization is terminated  or revoked sooner.       Influenza A by PCR NEGATIVE NEGATIVE Final   Influenza B by PCR NEGATIVE NEGATIVE Final    Comment: (NOTE) The Xpert Xpress SARS-CoV-2/FLU/RSV plus assay is intended as an aid in the diagnosis of influenza from  Nasopharyngeal swab specimens and should not be used as a sole basis for treatment. Nasal washings and aspirates are unacceptable for Xpert Xpress SARS-CoV-2/FLU/RSV testing.  Fact Sheet for Patients: EntrepreneurPulse.com.au  Fact Sheet for Healthcare Providers: IncredibleEmployment.be  This test is not yet approved or cleared by the [place] FDA and has been authorized for detection and/or diagnosis of SARS-CoV-2 by FDA under an Emergency Use Authorization (EUA). This EUA will remain in effect (meaning this test can be used) for the duration of the COVID-19 declaration under Section 564(b)(1) of the Act, 21 U.S.C. section 360bbb-3(b)(1), unless the authorization is terminated or revoked.  Performed at [facility], [place] [address], [place], [place] [address]          Radiology Studies: MR BRAIN WO CONTRAST  Result Date: 03/09/2020 CLINICAL DATA:  Seizure. EXAM: MRI HEAD WITHOUT CONTRAST TECHNIQUE: Multiplanar, multiecho pulse sequences of the brain and surrounding structures were obtained without intravenous  contrast. COMPARISON:  CT head 03/01/2011 FINDINGS: Brain: Ventricle size and cerebral volume normal. Negative for acute infarct, hemorrhage, mass. Normal white matter. Mesial temporal lobe normal in signal and volume bilaterally. Vascular: Normal arterial flow voids. 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 There appears to be prior cranioplasty in the posterior parietal lobe in the midline. Sinuses/Orbits: Paranasal sinuses clear.  Negative orbit Other: None IMPRESSION: No significant intracranial abnormality. Scaphocephaly. Electronically Signed   By: Franchot Gallo M.D.   On: 03/09/2020 11:23        Scheduled Meds: . amitriptyline  50 mg Oral QHS  . ARIPiprazole  5 mg Oral Daily  . atorvastatin  20 mg Oral Daily  . baclofen  5 mg Oral BID  . cephALEXin  500 mg Oral Q8H  . clotrimazole  1 Applicatorful Vaginal QHS  . enoxaparin (LOVENOX) injection  80 mg Subcutaneous Q24H  . fentaNYL  1 patch Transdermal Q72H  . gabapentin  300 mg Oral BID  . insulin aspart  0-15 Units Subcutaneous TID WC  . insulin aspart  0-5 Units Subcutaneous QHS  . insulin aspart  25 Units Subcutaneous TID WC  . insulin glargine  90 Units Subcutaneous QHS  . levETIRAcetam  750 mg Oral BID  . levothyroxine  150 mcg Oral Q0600  . lidocaine  4 patch Transdermal Q24H  . pantoprazole  40 mg Oral Daily  . topiramate  200 mg Oral BID   Continuous Infusions:   LOS: 3 days        Hosie Poisson, MD [facility]   To contact the attending provider between 7A-7P or the covering provider during after hours 7P-7A, please log into the web site [email or web address] and access using universal [place] password for that web site. If you do not have the password, please call the hospital operator.  03/11/2020, 10:53 AM

## 2020-03-11 NOTE — Progress Notes (Signed)
Inpatient Diabetes Program Recommendations  AACE/ADA: New Consensus Statement on Inpatient Glycemic Control   Target Ranges:  Prepandial:   less than 140 mg/dL      Peak postprandial:   less than 180 mg/dL (1-2 hours)      Critically ill patients:  140 - 180 mg/dL   Results for [name], [name] (MRN [id number]) as of 03/11/2020 12:22  Ref. Range 03/10/2020 05:28 03/10/2020 11:01 03/10/2020 15:59 03/10/2020 21:20 03/11/2020 05:50 03/11/2020 12:12  Glucose-Capillary Latest Ref Range: 70 - 99 mg/dL 320 (H) 414 (H) 351 (H) 280 (H) 277 (H) 292 (H)   Review of Glycemic Control  Diabetes history: DM2 Outpatient Diabetes medications: Lantus 95 units QHS (per home med list taking 75 units), Novolog 75 units with breakfast, Novolog 25 units with lunch, Novolog 75 units with supper, Januvia 50 mg daily, Trulicity 1.5 mg Qweek Current orders for Inpatient glycemic control: Lantus 90 units QHS, Novolog 25 units TID with meals, Novolog 0-15 units TID with meals, Novolog 0-5 units QHS  Inpatient Diabetes Program Recommendations:    Insulin: Please consider increasing Lantus to 95 units QHS and meal coverage to Novolog 35 units TID with meals.  NOTE: Per summarization of episode note from office visit on 12/28/19 by Dr. Chalmers Cater, patient has the following DM medicaitons listed: Trulicity 3 mg Qweek, Novolog 70/30 75 units with breakfast, 70/30  25 units with lunch, 70/30 75 units with supper, Jardiance 10 mg daily, Januvia 50 mg daily.   Thanks, Barnie Alderman, RN, MSN, CDE Diabetes Coordinator Inpatient Diabetes Program [phone] (Team Pager from 8am to 5pm)

## 2020-03-12 DIAGNOSIS — N179 Acute kidney failure, unspecified: Secondary | ICD-10-CM | POA: Diagnosis not present

## 2020-03-12 DIAGNOSIS — R300 Dysuria: Secondary | ICD-10-CM | POA: Diagnosis not present

## 2020-03-12 DIAGNOSIS — E1165 Type 2 diabetes mellitus with hyperglycemia: Secondary | ICD-10-CM | POA: Diagnosis not present

## 2020-03-12 LAB — GLUCOSE, CAPILLARY
Glucose-Capillary: 224 mg/dL — ABNORMAL HIGH (ref 70–99)
Glucose-Capillary: 215 mg/dL — ABNORMAL HIGH (ref 70–99)

## 2020-03-12 MED ORDER — INSULIN ASPART 100 UNIT/ML ~~LOC~~ SOLN
35.0000 [IU] | Freq: Three times a day (TID) | SUBCUTANEOUS | Status: DC
  Administered 2020-03-12 (×2): 35 [IU] via SUBCUTANEOUS

## 2020-03-12 MED ORDER — GABAPENTIN 600 MG PO TABS: 300.0000 mg | ORAL_TABLET | Freq: Three times a day (TID) | ORAL | Status: DC

## 2020-03-12 MED ORDER — CEPHALEXIN 500 MG PO CAPS: 500.0000 mg | ORAL_CAPSULE | Freq: Three times a day (TID) | ORAL | 0 refills | Status: AC

## 2020-03-12 MED ORDER — OXYCODONE HCL 5 MG PO TABS
10.0000 mg | ORAL_TABLET | Freq: Four times a day (QID) | ORAL | Status: DC | PRN
  Administered 2020-03-12 (×2): 10 mg via ORAL
  Filled 2020-03-12 (×2): qty 2

## 2020-03-12 NOTE — Progress Notes (Signed)
Patient given discharge instructions. New medications discussed including the importance of finishing entire antibiotic prescription. Patient verbalized understand and family at bedside also present for teaching. Tele removed and patient taken by wheelchair to main entrance

## 2020-03-12 NOTE — Plan of Care (Signed)

## 2020-03-12 NOTE — Care Management (Addendum)
[id number] 03-12-20 Case Manager offered choice for home health services via  Medicare.gov list for physical therapy and registered nurse. Patient declined services stating that she has support in the home and will not require home health. Patient states she takes her medications appropriately and gets to appointments without any problems. Case Manager made the patient aware that if she needs services in the future to contact her primary care provider. Secure chat sent to the provider to make her aware of patient declining services. Bethena Roys, RN,BSN Case Manager

## 2020-03-22 NOTE — Discharge Summary (Signed)
Physician Discharge Summary  [name] FYB:[id number] DOB: 02-07-1971 DOA: 03/07/2020  PCP: Pecolia Ades, NP  Admit date: 03/07/2020 Discharge date: 03/12/2020  Time spent: 35 minutes  Recommendations for Outpatient Follow-up:  1. PCP in 1 week, please review all imaging studies and relevant labs 2. Close follow-up with endocrinology Dr. Chalmers Cater, titrate insulin dose 3. Caution with polypharmacy   Discharge Diagnoses:  Principal Problem:   AKI (acute kidney injury) ([place]) Myoclonus Morbid obesity   Diabetes mellitus type 2 with complications, uncontrolled ([place])   Sleep apnea   Hypothyroidism   Chronic pain   Depression with anxiety   Seizures ([place])   Acute lower UTI   Discharge Condition: Stable  Diet recommendation: Diabetic  Filed Weights   03/10/20 0003 03/11/20 0507 03/12/20 0137  Weight: (!) 157 kg (!) 163.3 kg (!) 163.6 kg    History of present illness:  49 year old lady with prior h/o anxiety, depression, insulin dependent DM, hypertension, seizures,  Chronic pain syndrome, OSA, presented to ED with AKI, UTI and uncontrolled DM with hyperglycemia. She also reports jerky movements of the right upper extremity   Hospital Course:   AKI  Resolved , probably from hyperglycemia.   Uncontrolled type 2 diabetes mellitus:  -A1c is 11.9.  -Followed by endocrinologist Dr. Chalmers Cater who has her on the regimen of high-dose NovoLog 75 units in the morning, 75 units at night and 25 units at lunch -Advised close follow-up with her endocrinologist with a log of her blood sugars to make sure she does not need to be on a long-acting insulin  Acute UTI:  -Cultures not done on admission, transition to Keflex at discharge for 2 more days, symptoms have resolved  Tremors, jerking movements -Suspected to be myoclonus in the setting of decreased gabapentin clearance due to acute kidney injury, gabapentin dose decreased, seizures ruled out with EEG and MRI -Keppra  continued at home dose, also seen by neurology earlier this admission  Depression and anxiety:  Resume home meds.   Hypothyroidism:  Resume synthroid.   Hyperlipidemia:  Resume statin on discharge.       Discharge Exam: Vitals:   03/12/20 0338 03/12/20 0900  BP: 122/68 128/88  Pulse: 80 74  Resp: 20 15  Temp: (!) 97.5 F (36.4 C) 98.6 F (37 C)  SpO2: 94% 96%    General: AAOx3 Cardiovascular: S1-S2, regular rate rhythm Respiratory: Clear  Discharge Instructions   Discharge Instructions    Diet - low sodium heart healthy   Complete by: As directed    Diet Carb Modified   Complete by: As directed    Increase activity slowly   Complete by: As directed      Allergies as of 03/12/2020      Reactions   Penicillins Hives, Other (See Comments)   HAS PT DEVELOPED SEVERE RASH INVOLVING MUCUS MEMBRANES or SKIN NECROSIS: #  #  YES  #  # PATIENT HAS HAD A PCN REACTION THAT REQUIRED HOSPITALIZATION:  #  #  YES  #  #  Tolerates amoxicillin on multiple occasions per Dr. Darrel Hoover note 11/01/17.  TDD.   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   amitriptyline 25 MG tablet Commonly known as: ELAVIL Take 50 mg by mouth at bedtime.   ARIPiprazole 5 MG tablet Commonly known as: ABILIFY Take 5 mg by mouth daily.   atorvastatin 20 MG tablet Commonly known as: LIPITOR Take 20 mg by mouth daily.   baclofen 10 MG  tablet Commonly known as: LIORESAL Take 10 mg by mouth 3 (three) times daily.   Biotin 10000 MCG Tabs Take 10,000 mcg by mouth daily.   carisoprodol 350 MG tablet Commonly known as: SOMA Take 350 mg by mouth 2 (two) times daily.   cetirizine 10 MG tablet Commonly known as: ZYRTEC Take 10 mg by mouth daily as needed for allergies.   Diclofenac Sodium 2 % Soln Place 2-4 g onto the skin 4 (four) times daily as needed. What changed:   how much to take  reasons to take this   DULoxetine 60 MG capsule Commonly known as: CYMBALTA Take 60 mg by mouth 2 (two) times daily.   eletriptan 40 MG tablet Commonly known as: RELPAX Take 40 mg by mouth as needed for migraine or headache. May repeat in 2 hours if headache persists or recurs.   esomeprazole 40 MG capsule Commonly known as: NEXIUM Take 40 mg by mouth daily before breakfast. Name Brand Only   fentaNYL 50 MCG/HR Commonly known as: DURAGESIC Place 50 mcg onto the skin every 3 (three) days.   furosemide 20 MG tablet Commonly known as: LASIX Take 20 mg by mouth daily as needed for fluid.   gabapentin 600 MG tablet Commonly known as: NEURONTIN Take 0.5 tablets (300 mg total) by mouth 3 (three) times daily. What changed: how much to take   hydrOXYzine 25 MG capsule Commonly known as: VISTARIL Take 25 mg by mouth 3 (three) times daily as needed for anxiety.   insulin aspart 100 UNIT/ML FlexPen Commonly known as: NOVOLOG Inject 75 Units into the skin See admin instructions. Injects 75 units under the skin before breakfast; injects 25 units at lunch; injects 75 units at dinner time   levETIRAcetam 750 MG tablet Commonly known as: KEPPRA Take 750 mg by mouth 2 (two) times daily.   levothyroxine 150 MCG tablet Commonly known as: SYNTHROID Take 150 mcg by mouth daily.   lidocaine 5 % Commonly known as: LIDODERM Place 4 patches onto the skin daily.   lisinopril 20 MG tablet Commonly known as: ZESTRIL Take 20 mg by  mouth daily.   MUCINEX CLEAR & COOL DAY/NIGHT PO Take 1 tablet by mouth every 6 (six) hours as needed (cold symptoms).   Narcan 4 MG/0.1ML Liqd nasal spray kit Generic drug: naloxone Place 4 mg into the nose daily as needed (opioid overdose).   NyQuil Severe Cold/Flu 5-6.25-10-325 MG/15ML Liqd Generic drug: Phenyleph-Doxylamine-DM-APAP Take 30 mLs by mouth every 8 (eight) hours as needed (cold symptoms).   nystatin powder Generic drug: nystatin Apply 1 g topically daily.   Oxycodone HCl 10 MG Tabs Take 10 mg by mouth 4 (four) times daily.   promethazine 25 MG tablet Commonly known as: PHENERGAN Take 25 mg by mouth every 6 (six) hours as needed for nausea or vomiting.   senna 8.6 MG Tabs tablet Commonly  known as: SENOKOT Take 8.6 mg by mouth daily as needed for mild constipation.   sitaGLIPtin 50 MG tablet Commonly known as: JANUVIA Take 50 mg by mouth daily.   topiramate 200 MG tablet Commonly known as: TOPAMAX Take 200 mg by mouth 2 (two) times daily.   Trulicity 1.5 TK/1.6WF Sopn Generic drug: Dulaglutide Inject 1.5 mg into the skin every Thursday.   VITAMIN B-12 IJ Inject 1 each as directed every 30 (thirty) days.     ASK your doctor about these medications   cephALEXin 500 MG capsule Commonly known as: KEFLEX Take 1 capsule (500 mg total) by mouth every 8 (eight) hours for 2 days. Ask about: Should I take this medication?      Allergies  Allergen Reactions  . Penicillins Hives and Other (See Comments)    HAS PT DEVELOPED SEVERE RASH INVOLVING MUCUS MEMBRANES or SKIN NECROSIS: #  #  YES  #  # PATIENT HAS HAD A PCN REACTION THAT REQUIRED HOSPITALIZATION:  #  #  YES  #  #   Tolerates amoxicillin on multiple occasions per Dr. Darrel Hoover note 11/01/17.  TDD.  . Clindamycin/Lincomycin Hives, Dermatitis and Rash  . 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 Sulfa Antibiotics Hives  . Versed [Midazolam] Nausea And Vomiting     Pt had medication on October 16 2017 with no issues    Follow-up Information    Regan, Ripley Fraise, NP. Schedule an appointment as soon as possible for a visit in 1 week(s).   Specialty: Adult Health Nurse Practitioner Contact information: [place] [address] [phone]        Jacelyn Pi, MD. Schedule an appointment as soon as possible for a visit in 2 week(s).   Specialty: Endocrinology Contact information: [address] [place] [address] [phone]                The results of significant diagnostics from this hospitalization (including imaging, microbiology, ancillary and laboratory) are listed below for reference.    Significant Diagnostic Studies: EEG  Result Date: 03/09/2020 Lora Havens, MD     03/09/2020  9:04 AM Patient Name: [name] MRN: [id number] Epilepsy Attending: Lora Havens Referring Physician/Provider:  Dr Lesleigh Noe Date: 03/08/2020 Duration: 27.32 mins Patient history: 49 year old female with history of seizures who presented with increasing resting tremors and jerks.  EEG to evaluate for seizures. Level of alertness: Awake AEDs during EEG study: Keppra, Topamax, gabapentin Technical aspects: This EEG study was done with scalp electrodes positioned according to the 10-20 International system of electrode placement. Electrical activity was acquired at a sampling rate of '500Hz'  and reviewed with a high frequency filter of '70Hz'  and a low frequency filter of '1Hz' . EEG data were recorded continuously and digitally stored. Description: The posterior dominant rhythm consists of 7.5 Hz activity of moderate voltage (25-35 uV) seen predominantly in posterior head regions, symmetric and reactive to eye opening and eye closing. EEG showed continuous generalized 5 to 6 Hz theta as well as intermittent generalized 2 to 3 Hz delta slowing. Physiologic photic driving was not seen during photic  stimulation.  Hyperventilation was not performed.   ABNORMALITY -Continuous slow, generalized IMPRESSION: This study is suggestive of mild diffuse encephalopathy, nonspecific etiology. No seizures or epileptiform discharges were seen throughout the recording. Lora Havens   DG Chest 2 View  Result Date:  03/07/2020 CLINICAL DATA:  Shortness of breath EXAM: CHEST - 2 VIEW COMPARISON:  03/24/2019 FINDINGS: Shallow lung inflation with mild interstitial opacity, unchanged. No focal consolidation. Normal pleural spaces and cardiomediastinal contours. IMPRESSION: Shallow lung inflation without focal airspace disease. Electronically Signed   By: Ulyses Jarred M.D.   On: 03/07/2020 03:30   MR BRAIN WO CONTRAST  Result Date: 03/09/2020 CLINICAL DATA:  Seizure. EXAM: MRI HEAD WITHOUT CONTRAST TECHNIQUE: Multiplanar, multiecho pulse sequences of the brain and surrounding structures were obtained without intravenous contrast. COMPARISON:  CT head 03/01/2011 FINDINGS: Brain: Ventricle size and cerebral volume normal. Negative for acute infarct, hemorrhage, mass. Normal white matter. Mesial temporal lobe normal in signal and volume bilaterally. Vascular: Normal arterial flow voids. 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 There appears to be prior cranioplasty in the posterior parietal lobe in the midline. Sinuses/Orbits: Paranasal sinuses clear.  Negative orbit Other: None IMPRESSION: No significant intracranial abnormality. Scaphocephaly. Electronically Signed   By: Franchot Gallo M.D.   On: 03/09/2020 11:23    Microbiology: No results found for this or any previous visit (from the past 240 hour(s)).   Labs: Basic Metabolic Panel: No results for input(s): NA, K, CL, CO2, GLUCOSE, BUN, CREATININE, CALCIUM, MG, PHOS in the last 168 hours. Liver Function Tests: No results for input(s): AST, ALT, ALKPHOS, BILITOT, PROT, ALBUMIN in the last 168 hours. No  results for input(s): LIPASE, AMYLASE in the last 168 hours. No results for input(s): AMMONIA in the last 168 hours. CBC: No results for input(s): WBC, NEUTROABS, HGB, HCT, MCV, PLT in the last 168 hours. Cardiac Enzymes: No results for input(s): CKTOTAL, CKMB, CKMBINDEX, TROPONINI in the last 168 hours. BNP: BNP (last 3 results) Recent Labs    03/24/19 2012  BNP 23.0    ProBNP (last 3 results) No results for input(s): PROBNP in the last 8760 hours.  CBG: No results for input(s): GLUCAP in the last 168 hours.     Signed:  Domenic Polite MD.  [facility] 03/22/2020, 3:59 PM

## 2020-03-26 ENCOUNTER — Inpatient Hospital Stay (HOSPITAL_COMMUNITY): Payer: Medicare HMO

## 2020-03-26 ENCOUNTER — Encounter (HOSPITAL_COMMUNITY): Payer: Self-pay | Admitting: Internal Medicine

## 2020-03-26 ENCOUNTER — Emergency Department (HOSPITAL_COMMUNITY): Payer: Medicare HMO

## 2020-03-26 ENCOUNTER — Inpatient Hospital Stay (HOSPITAL_COMMUNITY)
Admission: EM | Admit: 2020-03-26 | Discharge: 2020-04-04 | DRG: 682 | Disposition: A | Discharge status: Skilled Nursing Facility | Payer: Medicare HMO | Attending: Internal Medicine | Admitting: Internal Medicine

## 2020-03-26 ENCOUNTER — Other Ambulatory Visit: Payer: Self-pay

## 2020-03-26 DIAGNOSIS — R278 Other lack of coordination: Secondary | ICD-10-CM | POA: Diagnosis present

## 2020-03-26 DIAGNOSIS — G928 Other toxic encephalopathy: Secondary | ICD-10-CM | POA: Diagnosis present

## 2020-03-26 DIAGNOSIS — G43909 Migraine, unspecified, not intractable, without status migrainosus: Secondary | ICD-10-CM | POA: Diagnosis present

## 2020-03-26 DIAGNOSIS — G253 Myoclonus: Secondary | ICD-10-CM | POA: Diagnosis present

## 2020-03-26 DIAGNOSIS — Z888 Allergy status to other drugs, medicaments and biological substances status: Secondary | ICD-10-CM

## 2020-03-26 DIAGNOSIS — N179 Acute kidney failure, unspecified: Secondary | ICD-10-CM | POA: Diagnosis present

## 2020-03-26 DIAGNOSIS — J9601 Acute respiratory failure with hypoxia: Secondary | ICD-10-CM | POA: Diagnosis present

## 2020-03-26 DIAGNOSIS — M797 Fibromyalgia: Secondary | ICD-10-CM | POA: Diagnosis present

## 2020-03-26 DIAGNOSIS — K5900 Constipation, unspecified: Secondary | ICD-10-CM | POA: Diagnosis present

## 2020-03-26 DIAGNOSIS — Z8249 Family history of ischemic heart disease and other diseases of the circulatory system: Secondary | ICD-10-CM

## 2020-03-26 DIAGNOSIS — M21371 Foot drop, right foot: Secondary | ICD-10-CM | POA: Diagnosis present

## 2020-03-26 DIAGNOSIS — R509 Fever, unspecified: Secondary | ICD-10-CM

## 2020-03-26 DIAGNOSIS — G9341 Metabolic encephalopathy: Secondary | ICD-10-CM | POA: Diagnosis present

## 2020-03-26 DIAGNOSIS — T426X5A Adverse effect of other antiepileptic and sedative-hypnotic drugs, initial encounter: Secondary | ICD-10-CM | POA: Diagnosis present

## 2020-03-26 DIAGNOSIS — Z20822 Contact with and (suspected) exposure to covid-19: Secondary | ICD-10-CM | POA: Diagnosis present

## 2020-03-26 DIAGNOSIS — Z6841 Body Mass Index (BMI) 40.0 and over, adult: Secondary | ICD-10-CM | POA: Diagnosis not present

## 2020-03-26 DIAGNOSIS — E869 Volume depletion, unspecified: Secondary | ICD-10-CM | POA: Diagnosis present

## 2020-03-26 DIAGNOSIS — G8929 Other chronic pain: Secondary | ICD-10-CM | POA: Diagnosis present

## 2020-03-26 DIAGNOSIS — F05 Delirium due to known physiological condition: Secondary | ICD-10-CM | POA: Diagnosis present

## 2020-03-26 DIAGNOSIS — G9340 Encephalopathy, unspecified: Secondary | ICD-10-CM | POA: Diagnosis not present

## 2020-03-26 DIAGNOSIS — N189 Chronic kidney disease, unspecified: Secondary | ICD-10-CM | POA: Diagnosis present

## 2020-03-26 DIAGNOSIS — E1165 Type 2 diabetes mellitus with hyperglycemia: Secondary | ICD-10-CM | POA: Diagnosis present

## 2020-03-26 DIAGNOSIS — E662 Morbid (severe) obesity with alveolar hypoventilation: Secondary | ICD-10-CM | POA: Diagnosis present

## 2020-03-26 DIAGNOSIS — Z886 Allergy status to analgesic agent status: Secondary | ICD-10-CM | POA: Diagnosis not present

## 2020-03-26 DIAGNOSIS — E1122 Type 2 diabetes mellitus with diabetic chronic kidney disease: Secondary | ICD-10-CM | POA: Diagnosis present

## 2020-03-26 DIAGNOSIS — I129 Hypertensive chronic kidney disease with stage 1 through stage 4 chronic kidney disease, or unspecified chronic kidney disease: Secondary | ICD-10-CM | POA: Diagnosis present

## 2020-03-26 DIAGNOSIS — F32A Depression, unspecified: Secondary | ICD-10-CM | POA: Diagnosis present

## 2020-03-26 DIAGNOSIS — K219 Gastro-esophageal reflux disease without esophagitis: Secondary | ICD-10-CM | POA: Diagnosis present

## 2020-03-26 DIAGNOSIS — Z87440 Personal history of urinary (tract) infections: Secondary | ICD-10-CM

## 2020-03-26 DIAGNOSIS — N170 Acute kidney failure with tubular necrosis: Principal | ICD-10-CM | POA: Diagnosis present

## 2020-03-26 DIAGNOSIS — Z880 Allergy status to penicillin: Secondary | ICD-10-CM | POA: Diagnosis not present

## 2020-03-26 DIAGNOSIS — E118 Type 2 diabetes mellitus with unspecified complications: Secondary | ICD-10-CM | POA: Diagnosis not present

## 2020-03-26 DIAGNOSIS — E039 Hypothyroidism, unspecified: Secondary | ICD-10-CM | POA: Diagnosis present

## 2020-03-26 DIAGNOSIS — M170 Bilateral primary osteoarthritis of knee: Secondary | ICD-10-CM | POA: Diagnosis present

## 2020-03-26 DIAGNOSIS — E1151 Type 2 diabetes mellitus with diabetic peripheral angiopathy without gangrene: Secondary | ICD-10-CM | POA: Diagnosis present

## 2020-03-26 DIAGNOSIS — Z833 Family history of diabetes mellitus: Secondary | ICD-10-CM

## 2020-03-26 DIAGNOSIS — Z79890 Hormone replacement therapy (postmenopausal): Secondary | ICD-10-CM | POA: Diagnosis not present

## 2020-03-26 DIAGNOSIS — Z452 Encounter for adjustment and management of vascular access device: Secondary | ICD-10-CM

## 2020-03-26 DIAGNOSIS — T50905A Adverse effect of unspecified drugs, medicaments and biological substances, initial encounter: Secondary | ICD-10-CM | POA: Diagnosis not present

## 2020-03-26 DIAGNOSIS — E1100 Type 2 diabetes mellitus with hyperosmolarity without nonketotic hyperglycemic-hyperosmolar coma (NKHHC): Secondary | ICD-10-CM | POA: Diagnosis not present

## 2020-03-26 DIAGNOSIS — F419 Anxiety disorder, unspecified: Secondary | ICD-10-CM | POA: Diagnosis present

## 2020-03-26 DIAGNOSIS — IMO0002 Reserved for concepts with insufficient information to code with codable children: Secondary | ICD-10-CM | POA: Diagnosis present

## 2020-03-26 DIAGNOSIS — M1990 Unspecified osteoarthritis, unspecified site: Secondary | ICD-10-CM | POA: Diagnosis present

## 2020-03-26 DIAGNOSIS — Z79899 Other long term (current) drug therapy: Secondary | ICD-10-CM

## 2020-03-26 DIAGNOSIS — Z794 Long term (current) use of insulin: Secondary | ICD-10-CM

## 2020-03-26 DIAGNOSIS — Z882 Allergy status to sulfonamides status: Secondary | ICD-10-CM | POA: Diagnosis not present

## 2020-03-26 DIAGNOSIS — N171 Acute kidney failure with acute cortical necrosis: Secondary | ICD-10-CM | POA: Diagnosis not present

## 2020-03-26 DIAGNOSIS — F410 Panic disorder [episodic paroxysmal anxiety] without agoraphobia: Secondary | ICD-10-CM | POA: Diagnosis present

## 2020-03-26 DIAGNOSIS — G4730 Sleep apnea, unspecified: Secondary | ICD-10-CM | POA: Diagnosis present

## 2020-03-26 DIAGNOSIS — G894 Chronic pain syndrome: Secondary | ICD-10-CM | POA: Diagnosis present

## 2020-03-26 DIAGNOSIS — R000 Tachycardia, unspecified: Secondary | ICD-10-CM | POA: Diagnosis present

## 2020-03-26 DIAGNOSIS — E119 Type 2 diabetes mellitus without complications: Secondary | ICD-10-CM | POA: Diagnosis present

## 2020-03-26 DIAGNOSIS — Z8614 Personal history of Methicillin resistant Staphylococcus aureus infection: Secondary | ICD-10-CM

## 2020-03-26 LAB — BLOOD GAS, VENOUS
Acid-base deficit: 8.4 mmol/L — ABNORMAL HIGH (ref 0.0–2.0)
Bicarbonate: 18 mmol/L — ABNORMAL LOW (ref 20.0–28.0)
FIO2: 21
O2 Saturation: 95.5 %
Patient temperature: 37
pCO2, Ven: 45.4 mmHg (ref 44.0–60.0)
pH, Ven: 7.222 — ABNORMAL LOW (ref 7.250–7.430)

## 2020-03-26 LAB — RESP PANEL BY RT-PCR (FLU A&B, COVID) ARPGX2
Influenza A by PCR: NEGATIVE
Influenza B by PCR: NEGATIVE
SARS Coronavirus 2 by RT PCR: NEGATIVE

## 2020-03-26 LAB — PROTIME-INR
INR: 1.1 (ref 0.8–1.2)
Prothrombin Time: 14.1 seconds (ref 11.4–15.2)

## 2020-03-26 LAB — I-STAT ARTERIAL BLOOD GAS, ED
Acid-base deficit: 8 mmol/L — ABNORMAL HIGH (ref 0.0–2.0)
Bicarbonate: 18.5 mmol/L — ABNORMAL LOW (ref 20.0–28.0)
Calcium, Ion: 1.07 mmol/L — ABNORMAL LOW (ref 1.15–1.40)
HCT: 38 % (ref 36.0–46.0)
Hemoglobin: 12.9 g/dL (ref 12.0–15.0)
O2 Saturation: 89 %
Potassium: 4.9 mmol/L (ref 3.5–5.1)
Sodium: 130 mmol/L — ABNORMAL LOW (ref 135–145)
TCO2: 20 mmol/L — ABNORMAL LOW (ref 22–32)
pCO2 arterial: 40.1 mmHg (ref 32.0–48.0)
pH, Arterial: 7.271 — ABNORMAL LOW (ref 7.350–7.450)
pO2, Arterial: 65 mmHg — ABNORMAL LOW (ref 83.0–108.0)

## 2020-03-26 LAB — COMPREHENSIVE METABOLIC PANEL
ALT: 41 U/L (ref 0–44)
AST: 57 U/L — ABNORMAL HIGH (ref 15–41)
Albumin: 3.6 g/dL (ref 3.5–5.0)
Alkaline Phosphatase: 109 U/L (ref 38–126)
Anion gap: 15 (ref 5–15)
BUN: 39 mg/dL — ABNORMAL HIGH (ref 6–20)
CO2: 18 mmol/L — ABNORMAL LOW (ref 22–32)
Calcium: 8.6 mg/dL — ABNORMAL LOW (ref 8.9–10.3)
Chloride: 100 mmol/L (ref 98–111)
Creatinine, Ser: 4.89 mg/dL — ABNORMAL HIGH (ref 0.44–1.00)
GFR, Estimated: 10 mL/min — ABNORMAL LOW (ref >60)
Glucose, Bld: 336 mg/dL — ABNORMAL HIGH (ref 70–99)
Potassium: 4.5 mmol/L (ref 3.5–5.1)
Sodium: 133 mmol/L — ABNORMAL LOW (ref 135–145)
Total Bilirubin: 0.6 mg/dL (ref 0.3–1.2)
Total Protein: 6.8 g/dL (ref 6.5–8.1)

## 2020-03-26 LAB — URINALYSIS, ROUTINE W REFLEX MICROSCOPIC
Bacteria, UA: NONE SEEN
Glucose, UA: 50 mg/dL — AB
Hgb urine dipstick: NEGATIVE
Ketones, ur: NEGATIVE mg/dL
Leukocytes,Ua: NEGATIVE
Nitrite: NEGATIVE
Protein, ur: 30 mg/dL — AB
Specific Gravity, Urine: 1.027 (ref 1.005–1.030)
pH: 5 (ref 5.0–8.0)

## 2020-03-26 LAB — CBC WITH DIFFERENTIAL/PLATELET
Abs Immature Granulocytes: 0.08 10*3/uL — ABNORMAL HIGH (ref 0.00–0.07)
Basophils Absolute: 0 10*3/uL (ref 0.0–0.1)
Basophils Relative: 0 %
Eosinophils Absolute: 0.1 10*3/uL (ref 0.0–0.5)
Eosinophils Relative: 0 %
HCT: 44 % (ref 36.0–46.0)
Hemoglobin: 13.8 g/dL (ref 12.0–15.0)
Immature Granulocytes: 1 %
Lymphocytes Relative: 27 %
Lymphs Abs: 3.9 10*3/uL (ref 0.7–4.0)
MCH: 30.5 pg (ref 26.0–34.0)
MCHC: 31.4 g/dL (ref 30.0–36.0)
MCV: 97.3 fL (ref 80.0–100.0)
Monocytes Absolute: 1.5 10*3/uL — ABNORMAL HIGH (ref 0.1–1.0)
Monocytes Relative: 10 %
Neutro Abs: 9.2 10*3/uL — ABNORMAL HIGH (ref 1.7–7.7)
Neutrophils Relative %: 62 %
Platelets: 279 10*3/uL (ref 150–400)
RBC: 4.52 MIL/uL (ref 3.87–5.11)
RDW: 14.3 % (ref 11.5–15.5)
WBC: 14.8 10*3/uL — ABNORMAL HIGH (ref 4.0–10.5)
nRBC: 0 % (ref 0.0–0.2)

## 2020-03-26 LAB — CBG MONITORING, ED
Glucose-Capillary: 322 mg/dL — ABNORMAL HIGH (ref 70–99)
Glucose-Capillary: 312 mg/dL — ABNORMAL HIGH (ref 70–99)
Glucose-Capillary: 268 mg/dL — ABNORMAL HIGH (ref 70–99)
Glucose-Capillary: 252 mg/dL — ABNORMAL HIGH (ref 70–99)
Glucose-Capillary: 204 mg/dL — ABNORMAL HIGH (ref 70–99)
Glucose-Capillary: 325 mg/dL — ABNORMAL HIGH (ref 70–99)
Glucose-Capillary: 186 mg/dL — ABNORMAL HIGH (ref 70–99)
Glucose-Capillary: 194 mg/dL — ABNORMAL HIGH (ref 70–99)
Glucose-Capillary: 171 mg/dL — ABNORMAL HIGH (ref 70–99)

## 2020-03-26 LAB — BASIC METABOLIC PANEL
Anion gap: 15 (ref 5–15)
BUN: 44 mg/dL — ABNORMAL HIGH (ref 6–20)
CO2: 15 mmol/L — ABNORMAL LOW (ref 22–32)
Calcium: 8 mg/dL — ABNORMAL LOW (ref 8.9–10.3)
Chloride: 104 mmol/L (ref 98–111)
Creatinine, Ser: 4.71 mg/dL — ABNORMAL HIGH (ref 0.44–1.00)
GFR, Estimated: 11 mL/min — ABNORMAL LOW (ref >60)
Glucose, Bld: 193 mg/dL — ABNORMAL HIGH (ref 70–99)
Potassium: 5.3 mmol/L — ABNORMAL HIGH (ref 3.5–5.1)
Sodium: 134 mmol/L — ABNORMAL LOW (ref 135–145)

## 2020-03-26 LAB — I-STAT BETA HCG BLOOD, ED (MC, WL, AP ONLY): I-stat hCG, quantitative: <5 m[IU]/mL (ref <5)

## 2020-03-26 LAB — APTT: aPTT: 30 seconds (ref 24–36)

## 2020-03-26 LAB — SODIUM, URINE, RANDOM: Sodium, Ur: 15 mmol/L

## 2020-03-26 LAB — GLUCOSE, CAPILLARY
Glucose-Capillary: 185 mg/dL — ABNORMAL HIGH (ref 70–99)
Glucose-Capillary: 191 mg/dL — ABNORMAL HIGH (ref 70–99)
Glucose-Capillary: 168 mg/dL — ABNORMAL HIGH (ref 70–99)
Glucose-Capillary: 157 mg/dL — ABNORMAL HIGH (ref 70–99)

## 2020-03-26 LAB — LACTIC ACID, PLASMA
Lactic Acid, Venous: 2.5 mmol/L (ref 0.5–1.9)
Lactic Acid, Venous: 1.4 mmol/L (ref 0.5–1.9)

## 2020-03-26 LAB — PROCALCITONIN: Procalcitonin: 0.95 ng/mL

## 2020-03-26 LAB — HIV ANTIBODY (ROUTINE TESTING W REFLEX): HIV Screen 4th Generation wRfx: NONREACTIVE

## 2020-03-26 LAB — MRSA PCR SCREENING: MRSA by PCR: NEGATIVE

## 2020-03-26 LAB — CREATININE, URINE, RANDOM: Creatinine, Urine: 310.88 mg/dL

## 2020-03-26 IMAGING — CT CT HEAD W/O CM
3 of 4 series · 16 of 47 positions shown, 19 images · non-contrast
Comparison: [DATE]

CLINICAL DATA: Delirium.

EXAM:
CT HEAD WITHOUT CONTRAST
TECHNIQUE: Contiguous axial images were obtained from the base of the skull
through the vertex without intravenous contrast.

[Series 4: head 2.0 h70h · axial · 0.46mm/px · z∈[+922,+1096]mm · 10 of 97 slices shown, 13 images]
[im 5/97  brain]
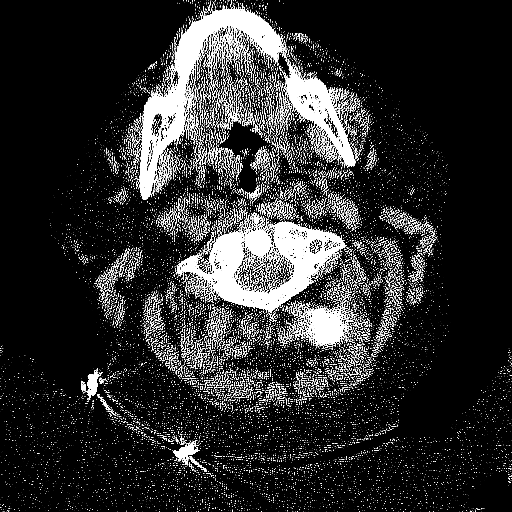
[im 5/97  bone]
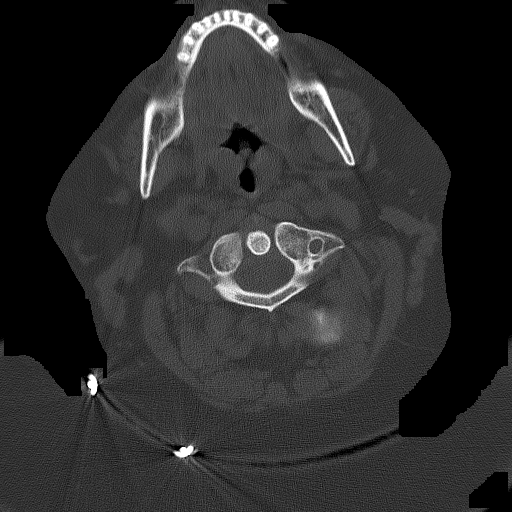
[im 15/97  brain]
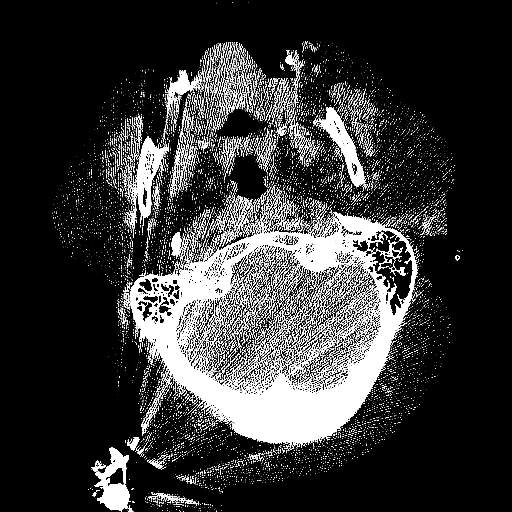
[im 25/97  brain]
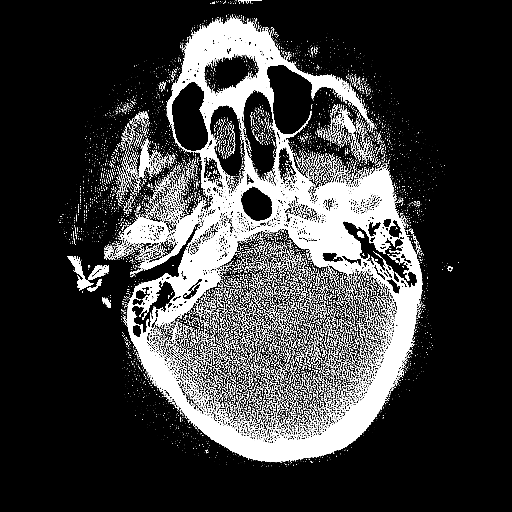
[im 34/97  brain]
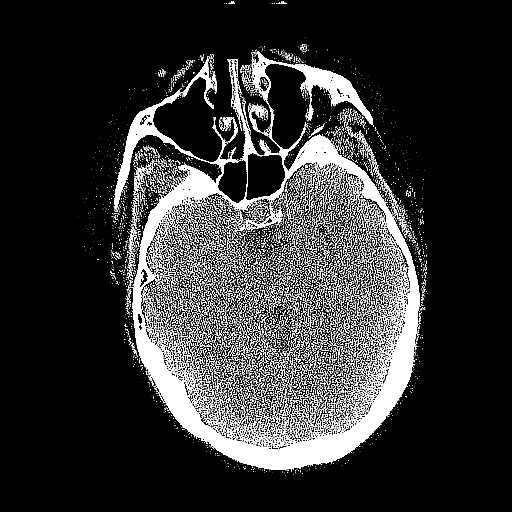
[im 44/97  brain]
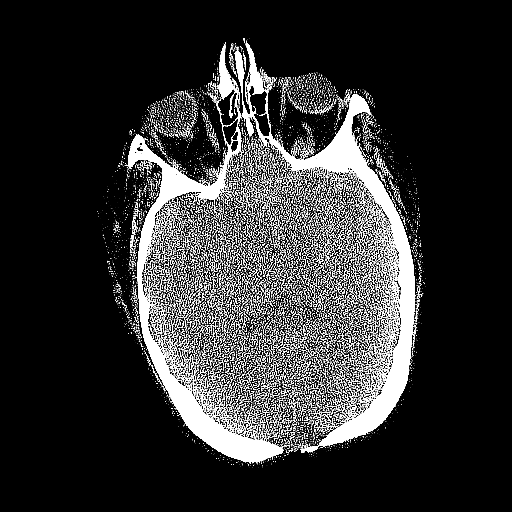
[im 44/97  bone]
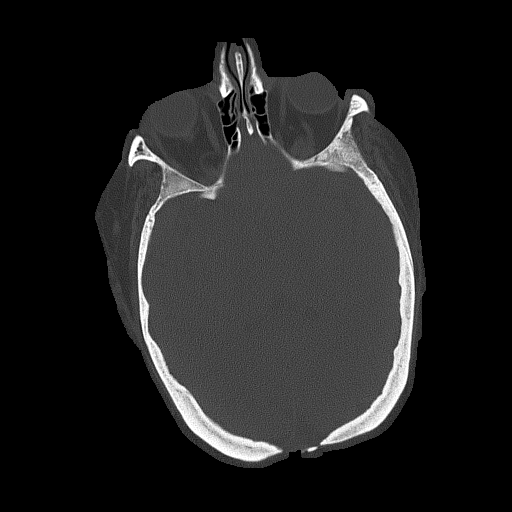
[im 53/97  brain]
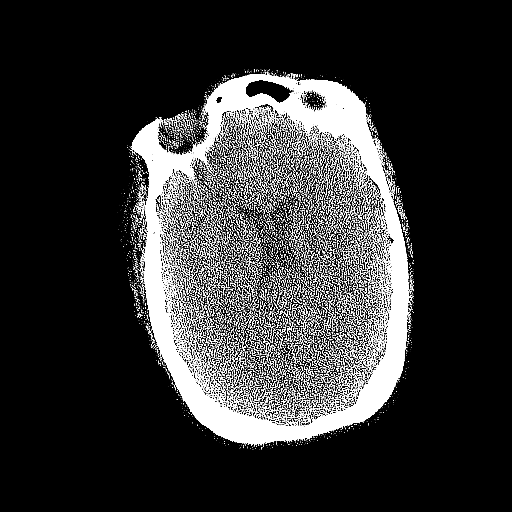
[im 63/97  brain]
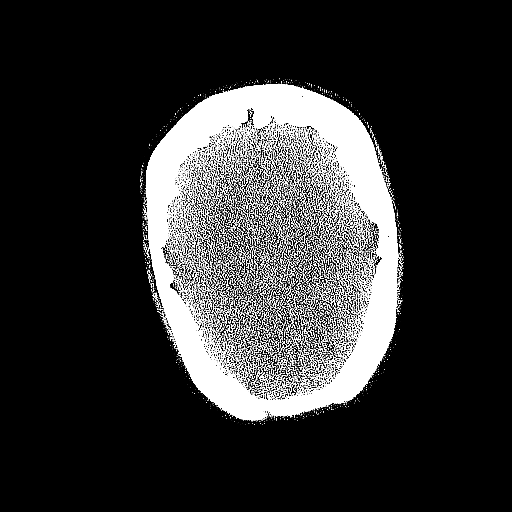
[im 73/97  brain]
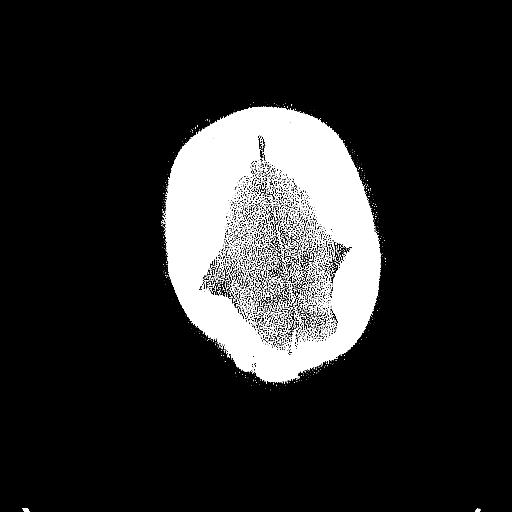
[im 82/97  brain]
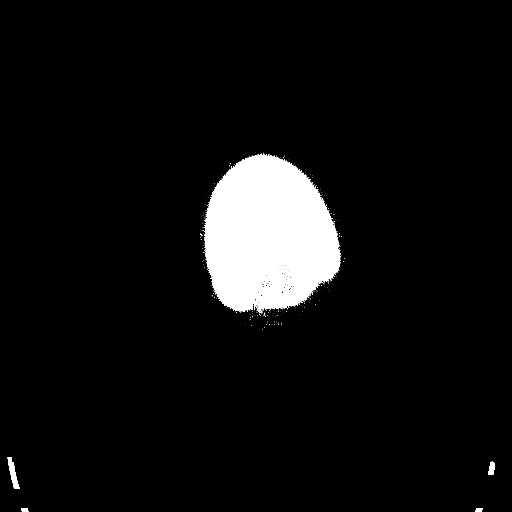
[im 82/97  bone]
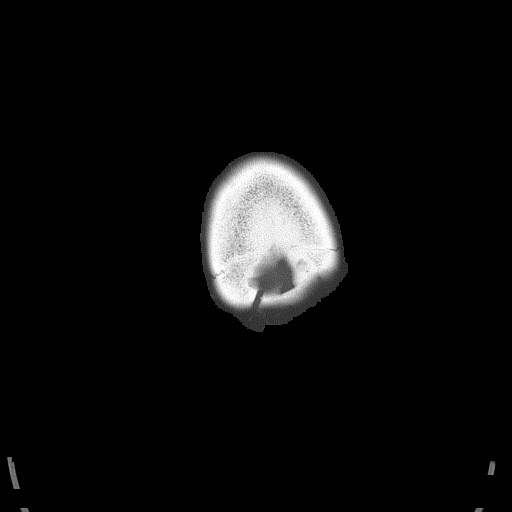
[im 92/97  brain]
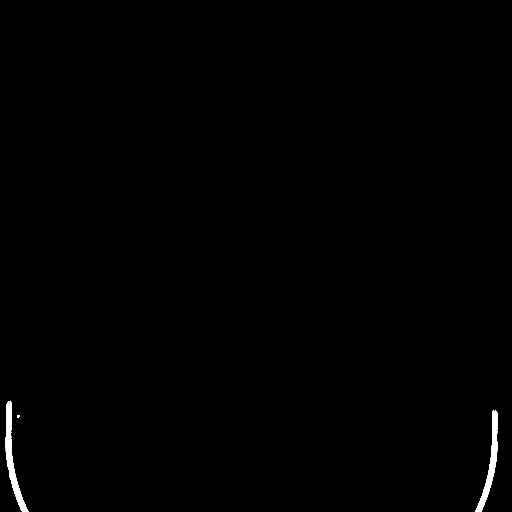

[Series 5: head 3.0 mpr cor · coronal · 0.35mm/px · 3 of 77 slices shown]
[im 26/77  brain]
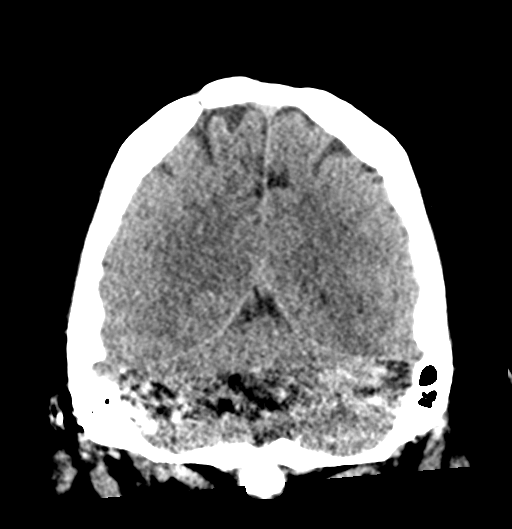
[im 34/77  brain]
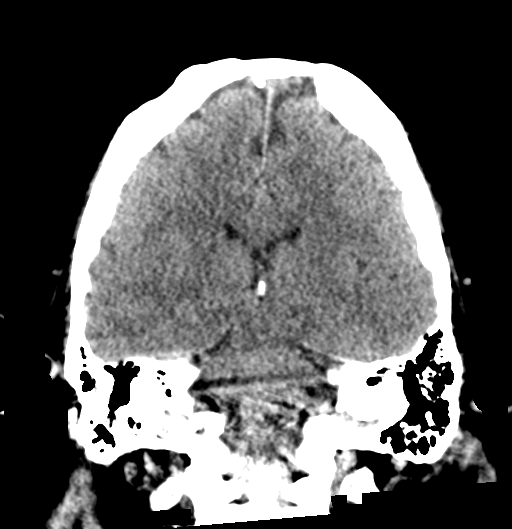
[im 43/77  brain]
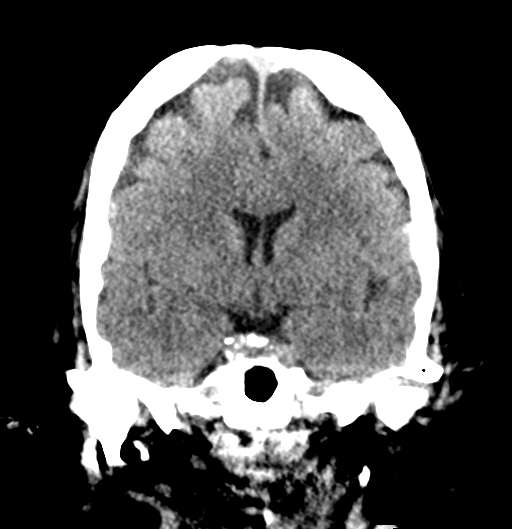

[Series 6: head 3.0 mpr sag · sagittal · 0.35mm/px · 3 of 67 slices shown]
[im 23/67  brain]
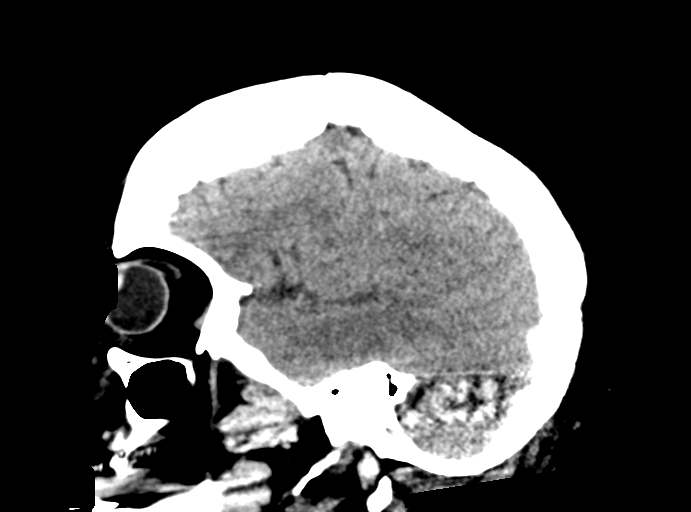
[im 34/67  brain]
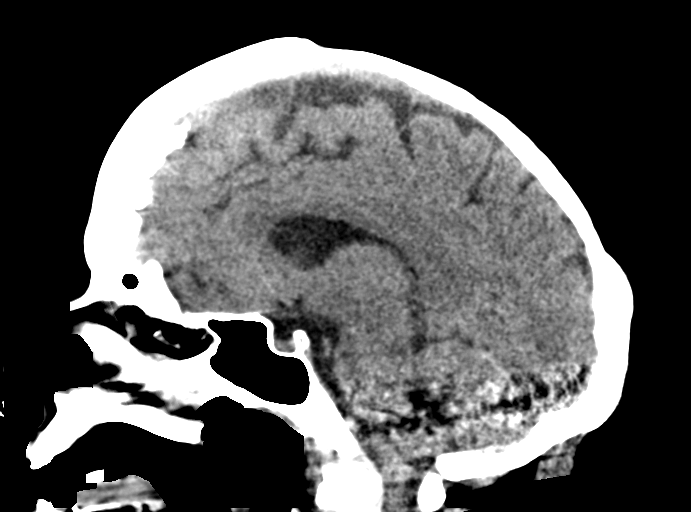
[im 45/67  brain]
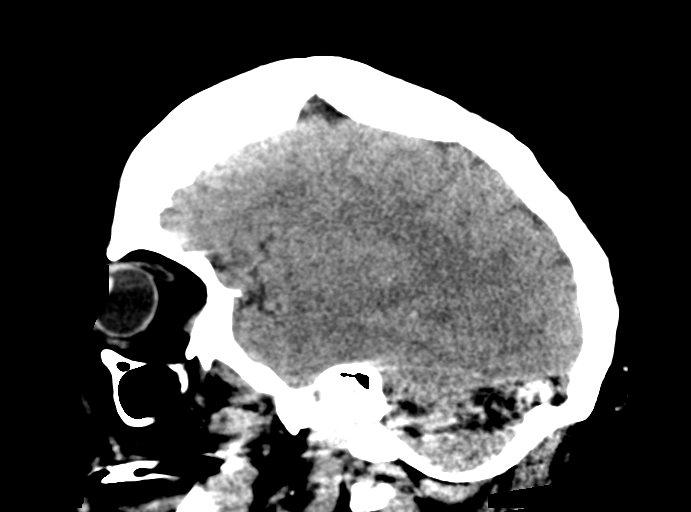

[16 of 47 positions shown; findings below may reference images not displayed]

FINDINGS: Brain: No evidence of acute infarction, hemorrhage, hydrocephalus,
extra-axial collection or mass lesion/mass effect.

Vascular: No hyperdense vessel is noted.

Skull: Defect in parietal bone over convexity stable compared to
prior exam

Sinuses/Orbits: No acute finding.

Other: None.
IMPRESSION: No focal acute intracranial abnormality identified.

## 2020-03-26 IMAGING — DX DG CHEST 1V PORT
1 series · 1 of 1 positions shown · non-contrast
Comparison: None.

CLINICAL DATA: Sepsis

EXAM:
PORTABLE CHEST 1 VIEW

[chest]
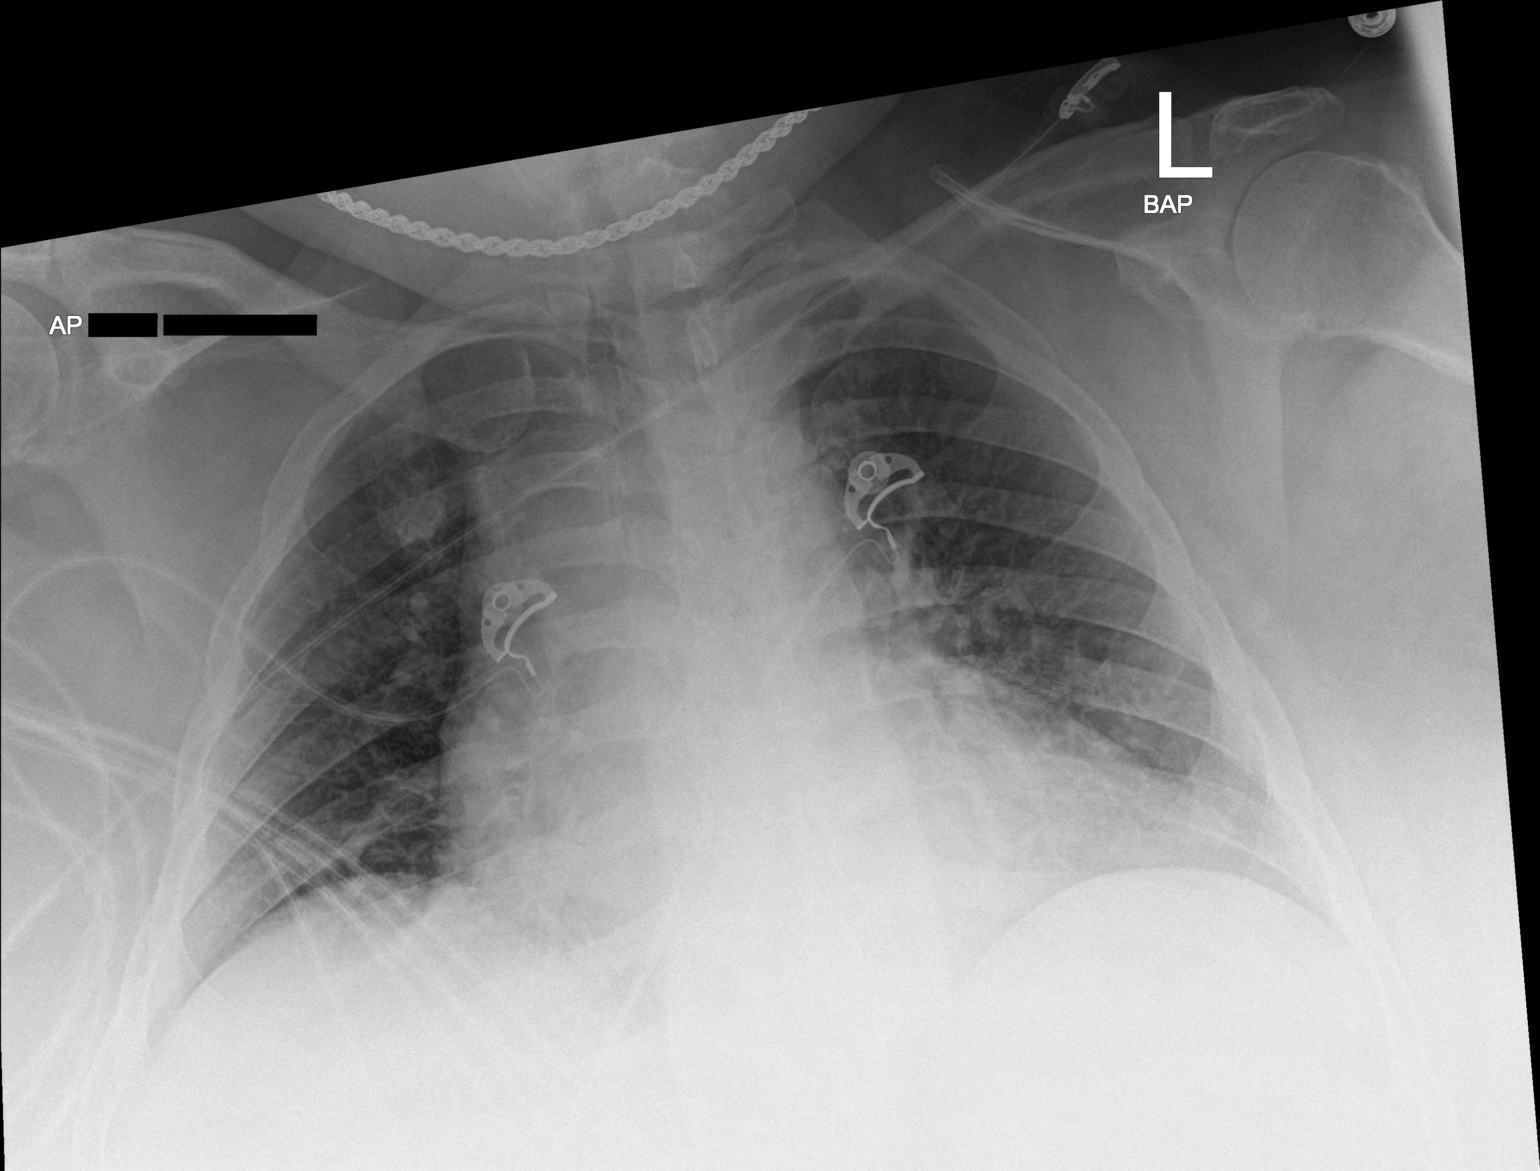

[1 of 1 positions shown; findings below may reference images not displayed]

FINDINGS: The heart size and mediastinal contours are within normal limits.
Overall shallow degree of aeration. Both lungs are clear. The
visualized skeletal structures are unremarkable.
IMPRESSION: No active disease.  Shallow degree of aeration.

## 2020-03-26 IMAGING — CT CT RENAL STONE PROTOCOL
2 of 4 series · 16 of 46 positions shown, 18 images · non-contrast
Comparison: [DATE] CT abdomen/pelvis.

CLINICAL DATA: Psych pain, fever, dyspnea and confusion.

EXAM:
CT ABDOMEN AND PELVIS WITHOUT CONTRAST
TECHNIQUE: Multidetector CT imaging of the abdomen and pelvis was performed
following the standard protocol without IV contrast.

[Series 3: renal stone 5.0 · axial · 0.98mm/px · z∈[+1034,+1559]mm · 13 of 116 slices shown, 15 images]
[im 6/116  soft-tissue]
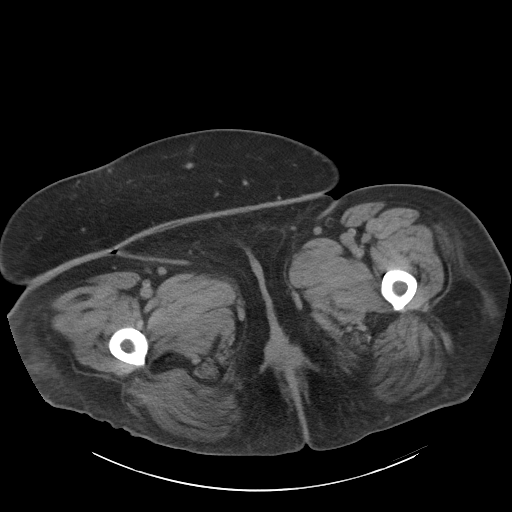
[im 6/116  bone]
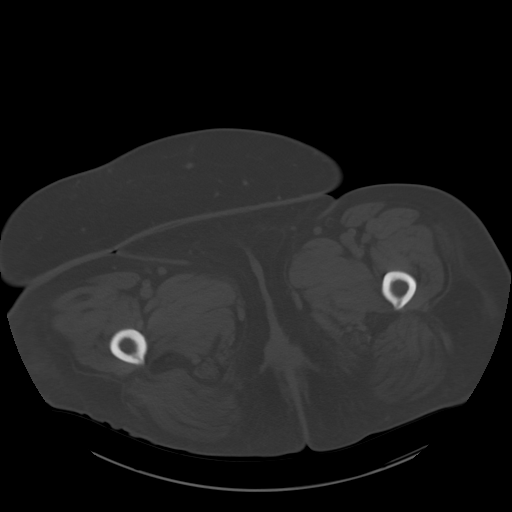
[im 16/116  soft-tissue]
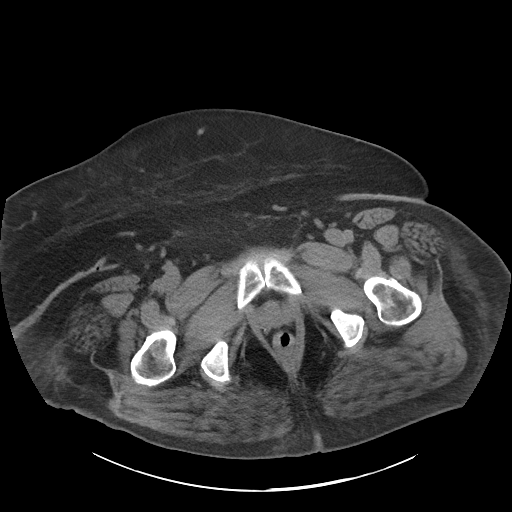
[im 26/116  soft-tissue]
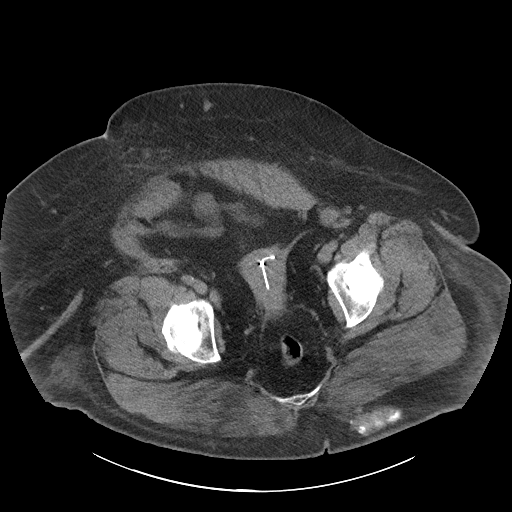
[im 31/116  soft-tissue]
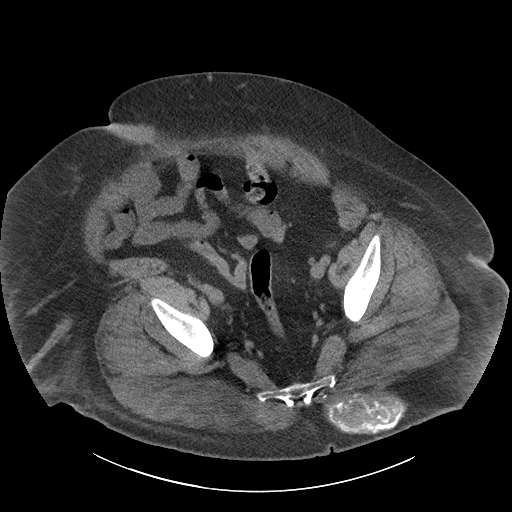
[im 41/116  soft-tissue]
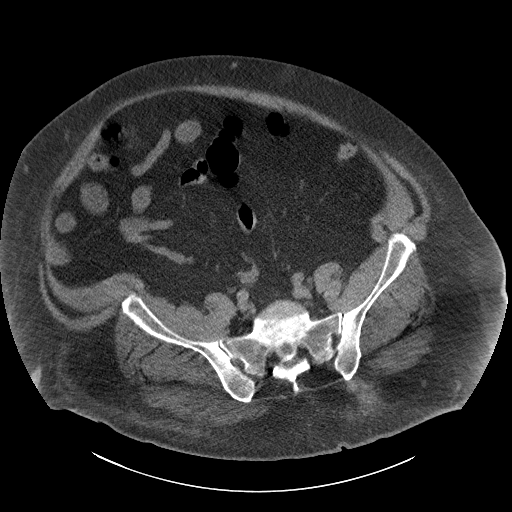
[im 51/116  soft-tissue]
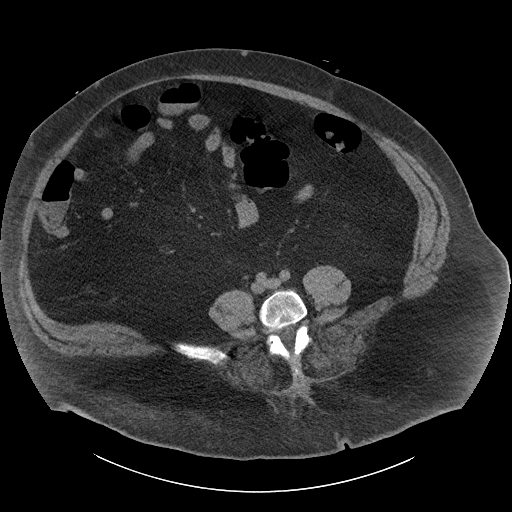
[im 61/116  soft-tissue]
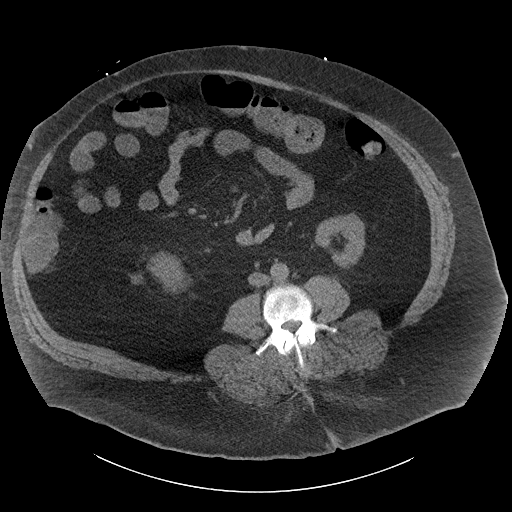
[im 66/116  soft-tissue]
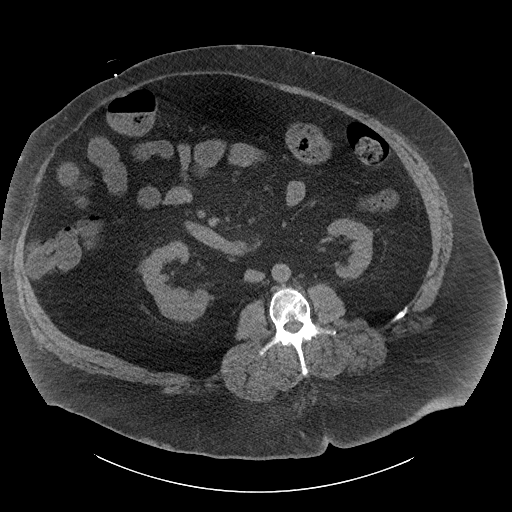
[im 76/116  soft-tissue]
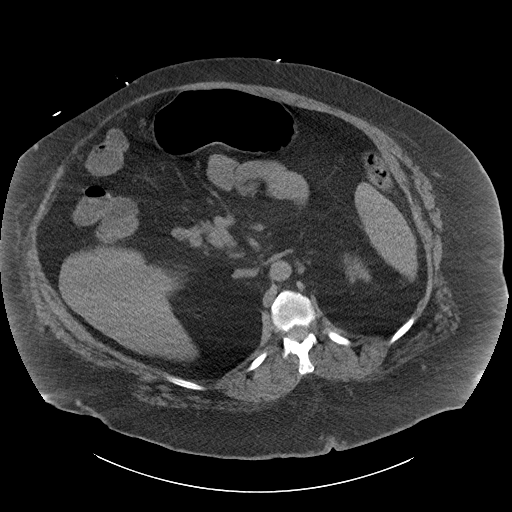
[im 76/116  bone]
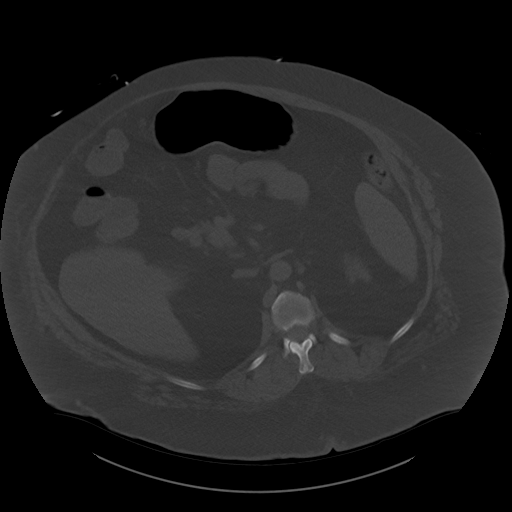
[im 86/116  soft-tissue]
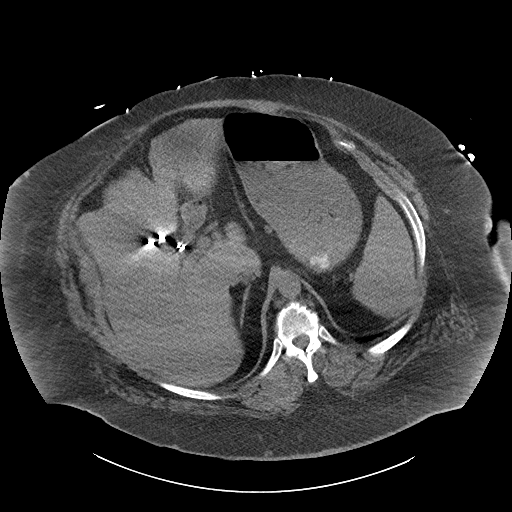
[im 91/116  soft-tissue]
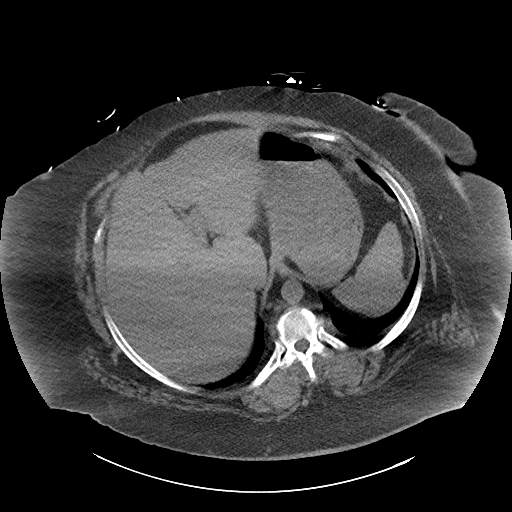
[im 101/116  soft-tissue]
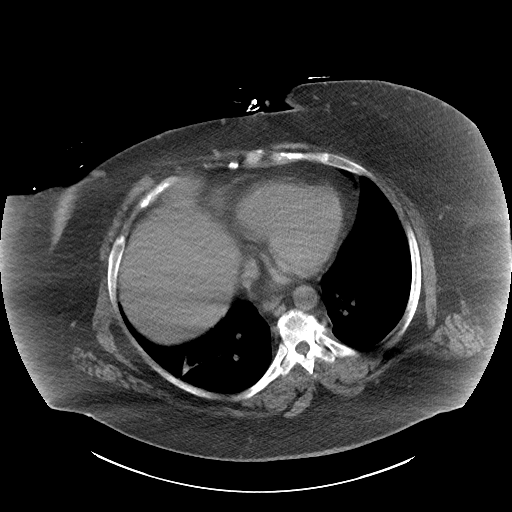
[im 111/116  soft-tissue]
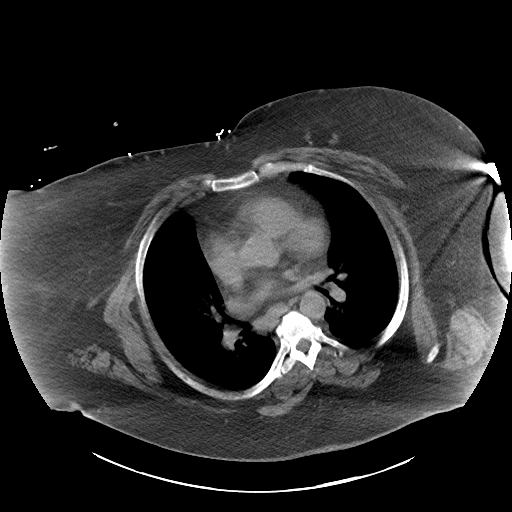

[Series 6: coronal · coronal · 1.09mm/px · 3 of 148 slices shown]
[im 50/148  soft-tissue]
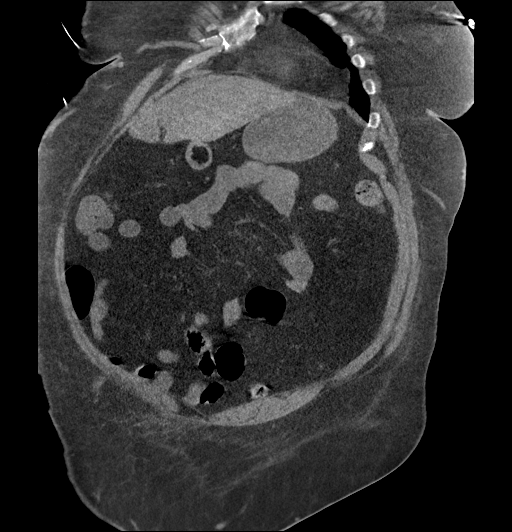
[im 66/148  soft-tissue]
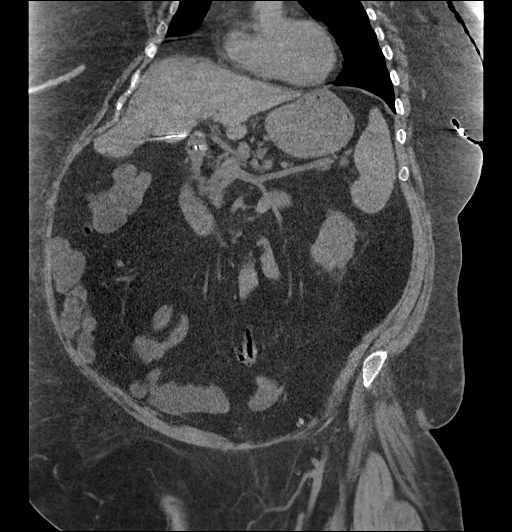
[im 82/148  soft-tissue]
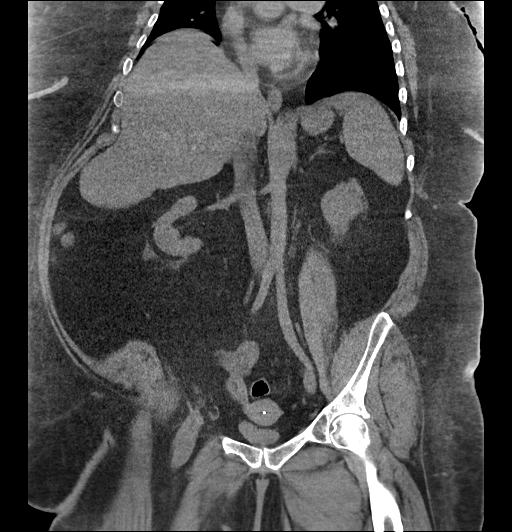

[16 of 46 positions shown; findings below may reference images not displayed]

FINDINGS: Lower chest: No significant pulmonary nodules or acute consolidative
airspace disease. Mildly enlarged 1.2 cm right subcarinal node
(series 3/image 10), stable since [XE] CT, most compatible with
benign reactive etiology.

Hepatobiliary: Normal liver size. No liver mass. Cholecystectomy. No
biliary ductal dilatation.

Pancreas: Normal, with no mass or duct dilation.

Spleen: Normal size. No mass.

Adrenals/Urinary Tract: Normal adrenals. No hydronephrosis. No renal
stones. No contour deforming renal masses. Normal caliber ureters.
No ureteral stones. Normal nondistended bladder.

Stomach/Bowel: Normal non-distended stomach. Normal caliber small
bowel with no small bowel wall thickening. Normal appendix. Normal
large bowel with no diverticulosis, large bowel wall thickening or
pericolonic fat stranding.

Vascular/Lymphatic: Normal caliber abdominal aorta. Stable mild
porta hepatis adenopathy up to 1.1 cm (series 3/image 34). No new
pathologically enlarged lymph nodes in the abdomen or pelvis.

Reproductive: Grossly normal uterus. Intrauterine device is grossly
well-positioned in the uterine cavity. No adnexal mass.

Other: No pneumoperitoneum, ascites or focal fluid collection.

Musculoskeletal: No aggressive appearing focal osseous lesions.
Marked lumbar spondylosis. Stable mixed calcific and fat density
cm focus in subcutaneous medial left gluteal region, most compatible
with fat necrosis.
IMPRESSION: 1. No acute abnormality. No nephrolithiasis. No hydronephrosis. No
evidence of bowel obstruction or acute bowel inflammation. Normal
appendix.
2. Chronic stable mild porta hepatis adenopathy, probably reactive.

## 2020-03-26 MED ORDER — ACETAMINOPHEN 650 MG RE SUPP: 650.0000 mg | Freq: Four times a day (QID) | RECTAL | Status: DC | PRN

## 2020-03-26 MED ORDER — CHLORHEXIDINE GLUCONATE CLOTH 2 % EX PADS
6.0000 | MEDICATED_PAD | Freq: Every day | CUTANEOUS | Status: DC
  Administered 2020-03-26 – 2020-04-03 (×3): 6 via TOPICAL

## 2020-03-26 MED ORDER — PANTOPRAZOLE SODIUM 40 MG PO TBEC
40.0000 mg | DELAYED_RELEASE_TABLET | Freq: Every day | ORAL | Status: DC
  Administered 2020-03-27 – 2020-04-03 (×8): 40 mg via ORAL
  Filled 2020-03-26 (×8): qty 1

## 2020-03-26 MED ORDER — DULOXETINE HCL 60 MG PO CPEP
60.0000 mg | ORAL_CAPSULE | Freq: Two times a day (BID) | ORAL | Status: DC
Start: 2020-03-26 — End: 2020-03-26
  Filled 2020-03-26 (×2): qty 1

## 2020-03-26 MED ORDER — VANCOMYCIN HCL IN DEXTROSE 1-5 GM/200ML-% IV SOLN: 1000.0000 mg | Freq: Once | INTRAVENOUS | Status: DC

## 2020-03-26 MED ORDER — ONDANSETRON HCL 4 MG/2ML IJ SOLN: 4.0000 mg | Freq: Four times a day (QID) | INTRAMUSCULAR | Status: DC | PRN

## 2020-03-26 MED ORDER — ATORVASTATIN CALCIUM 10 MG PO TABS
20.0000 mg | ORAL_TABLET | Freq: Every day | ORAL | Status: DC
Start: 2020-03-26 — End: 2020-04-04
  Administered 2020-03-27 – 2020-04-03 (×8): 20 mg via ORAL
  Filled 2020-03-26 (×8): qty 2

## 2020-03-26 MED ORDER — ALTEPLASE 2 MG IJ SOLR: 2.0000 mg | Freq: Once | INTRAMUSCULAR | Status: DC | PRN

## 2020-03-26 MED ORDER — PRISMASOL BGK 4/2.5 32-4-2.5 MEQ/L REPLACEMENT SOLN
Status: DC
  Filled 2020-03-26 (×2): qty 5000

## 2020-03-26 MED ORDER — ONDANSETRON HCL 4 MG PO TABS: 4.0000 mg | ORAL_TABLET | Freq: Four times a day (QID) | ORAL | Status: DC | PRN

## 2020-03-26 MED ORDER — VANCOMYCIN HCL 2000 MG/400ML IV SOLN
2000.0000 mg | Freq: Once | INTRAVENOUS | Status: AC
  Administered 2020-03-26: 2000 mg via INTRAVENOUS
  Filled 2020-03-26: qty 400

## 2020-03-26 MED ORDER — LIDOCAINE 5 % EX PTCH
4.0000 | MEDICATED_PATCH | CUTANEOUS | Status: DC
  Administered 2020-03-27 – 2020-04-03 (×8): 4 via TRANSDERMAL
  Filled 2020-03-26 (×8): qty 4

## 2020-03-26 MED ORDER — VANCOMYCIN VARIABLE DOSE PER UNSTABLE RENAL FUNCTION (PHARMACIST DOSING): Status: DC

## 2020-03-26 MED ORDER — SODIUM CHLORIDE 0.9 % FOR CRRT: INTRAVENOUS_CENTRAL | Status: DC | PRN

## 2020-03-26 MED ORDER — HEPARIN SODIUM (PORCINE) 1000 UNIT/ML DIALYSIS
1000.0000 [IU] | INTRAMUSCULAR | Status: DC | PRN
  Administered 2020-03-27: 1300 [IU] via INTRAVENOUS_CENTRAL
  Filled 2020-03-26 (×3): qty 3

## 2020-03-26 MED ORDER — SODIUM CHLORIDE 0.9 % IV SOLN
750.0000 mg | Freq: Two times a day (BID) | INTRAVENOUS | Status: DC
  Administered 2020-03-26 – 2020-03-30 (×9): 750 mg via INTRAVENOUS
  Filled 2020-03-26 (×12): qty 7.5

## 2020-03-26 MED ORDER — ARIPIPRAZOLE 5 MG PO TABS
5.0000 mg | ORAL_TABLET | Freq: Every day | ORAL | Status: DC
  Filled 2020-03-26: qty 1

## 2020-03-26 MED ORDER — LACTATED RINGERS IV SOLN: INTRAVENOUS | Status: DC

## 2020-03-26 MED ORDER — FENTANYL 50 MCG/HR TD PT72
1.0000 | MEDICATED_PATCH | TRANSDERMAL | Status: DC
  Administered 2020-03-26 – 2020-04-01 (×3): 1 via TRANSDERMAL
  Filled 2020-03-26 (×3): qty 1

## 2020-03-26 MED ORDER — POLYETHYLENE GLYCOL 3350 17 G PO PACK: 17.0000 g | PACK | Freq: Every day | ORAL | Status: DC | PRN

## 2020-03-26 MED ORDER — LACTATED RINGERS IV BOLUS
1000.0000 mL | Freq: Once | INTRAVENOUS | Status: AC
  Administered 2020-03-26: 1000 mL via INTRAVENOUS

## 2020-03-26 MED ORDER — INSULIN REGULAR(HUMAN) IN NACL 100-0.9 UT/100ML-% IV SOLN
INTRAVENOUS | Status: AC
  Administered 2020-03-26: 11.5 [IU]/h via INTRAVENOUS
  Administered 2020-03-27: 13 [IU]/h via INTRAVENOUS
  Administered 2020-03-27: 5.5 [IU]/h via INTRAVENOUS
  Administered 2020-03-28: 7 [IU]/h via INTRAVENOUS
  Filled 2020-03-26 (×5): qty 100

## 2020-03-26 MED ORDER — ALBUTEROL SULFATE (2.5 MG/3ML) 0.083% IN NEBU: 2.5000 mg | INHALATION_SOLUTION | RESPIRATORY_TRACT | Status: DC | PRN

## 2020-03-26 MED ORDER — SODIUM CHLORIDE 0.9 % IV SOLN: 2.0000 g | Freq: Once | INTRAVENOUS | Status: DC

## 2020-03-26 MED ORDER — HYDRALAZINE HCL 20 MG/ML IJ SOLN: 5.0000 mg | INTRAMUSCULAR | Status: DC | PRN

## 2020-03-26 MED ORDER — HEPARIN SODIUM (PORCINE) 5000 UNIT/ML IJ SOLN
5000.0000 [IU] | Freq: Three times a day (TID) | INTRAMUSCULAR | Status: DC
  Administered 2020-03-26 – 2020-04-03 (×23): 5000 [IU] via SUBCUTANEOUS
  Filled 2020-03-26 (×23): qty 1

## 2020-03-26 MED ORDER — PRISMASOL BGK 4/2.5 32-4-2.5 MEQ/L REPLACEMENT SOLN
Status: DC
  Filled 2020-03-26 (×4): qty 5000

## 2020-03-26 MED ORDER — LACTATED RINGERS IV BOLUS (SEPSIS)
1000.0000 mL | Freq: Once | INTRAVENOUS | Status: AC
  Administered 2020-03-26: 1000 mL via INTRAVENOUS

## 2020-03-26 MED ORDER — DEXTROSE IN LACTATED RINGERS 5 % IV SOLN: INTRAVENOUS | Status: AC

## 2020-03-26 MED ORDER — METRONIDAZOLE IN NACL 5-0.79 MG/ML-% IV SOLN
500.0000 mg | Freq: Once | INTRAVENOUS | Status: AC
  Administered 2020-03-26: 500 mg via INTRAVENOUS
  Filled 2020-03-26: qty 100

## 2020-03-26 MED ORDER — SENNA 8.6 MG PO TABS: 8.6000 mg | ORAL_TABLET | Freq: Every day | ORAL | Status: DC | PRN

## 2020-03-26 MED ORDER — LACTATED RINGERS IV BOLUS (SEPSIS)
800.0000 mL | Freq: Once | INTRAVENOUS | Status: AC
  Administered 2020-03-26: 800 mL via INTRAVENOUS

## 2020-03-26 MED ORDER — SODIUM CHLORIDE 0.9 % IV SOLN
2.0000 g | Freq: Two times a day (BID) | INTRAVENOUS | Status: DC
  Administered 2020-03-26 – 2020-03-29 (×6): 2 g via INTRAVENOUS
  Filled 2020-03-26 (×6): qty 2

## 2020-03-26 MED ORDER — PRISMASOL BGK 4/2.5 32-4-2.5 MEQ/L EC SOLN
Status: DC
  Filled 2020-03-26 (×20): qty 5000

## 2020-03-26 MED ORDER — DEXTROSE 50 % IV SOLN
0.0000 mL | INTRAVENOUS | Status: DC | PRN
Start: 2020-03-26 — End: 2020-04-04

## 2020-03-26 MED ORDER — LEVOTHYROXINE SODIUM 75 MCG PO TABS
150.0000 ug | ORAL_TABLET | Freq: Every day | ORAL | Status: DC
  Administered 2020-03-27 – 2020-04-03 (×8): 150 ug via ORAL
  Filled 2020-03-26 (×4): qty 2
  Filled 2020-03-26: qty 1
  Filled 2020-03-26 (×2): qty 2
  Filled 2020-03-26: qty 1

## 2020-03-26 MED ORDER — SODIUM CHLORIDE 0.9 % IV SOLN
2.0000 g | Freq: Once | INTRAVENOUS | Status: AC
  Administered 2020-03-26: 2 g via INTRAVENOUS
  Filled 2020-03-26: qty 2

## 2020-03-26 MED ORDER — ACETAMINOPHEN 325 MG PO TABS
650.0000 mg | ORAL_TABLET | Freq: Four times a day (QID) | ORAL | Status: DC | PRN
  Administered 2020-03-26 – 2020-03-28 (×6): 650 mg via ORAL
  Filled 2020-03-26 (×7): qty 2

## 2020-03-26 MED ORDER — SODIUM CHLORIDE 0.9 % IV SOLN: 2.0000 g | INTRAVENOUS | Status: DC

## 2020-03-26 MED ORDER — NALOXONE HCL 4 MG/0.1ML NA LIQD
4.0000 mg | Freq: Every day | NASAL | Status: DC | PRN
  Filled 2020-03-26: qty 8

## 2020-03-26 MED ORDER — SODIUM CHLORIDE 0.9% FLUSH
3.0000 mL | Freq: Two times a day (BID) | INTRAVENOUS | Status: DC
  Administered 2020-03-26 – 2020-04-03 (×18): 3 mL via INTRAVENOUS

## 2020-03-26 NOTE — ED Notes (Addendum)
Called Dr. Ophelia Charter to bedside for concern of patient's breathing status. Pt currently appears labored, taking short frequent breaths. Increase in O2 requirement from 2 L to 4 L due to pt being mildly hypoxic at 90 %. O2 sat did improve to 94% but pt sluggish to respond and only AOx2. Dr. Ophelia Charter ordered ABG and consulting with CCM.

## 2020-03-26 NOTE — Consult Note (Signed)
Renal Service Consult Note [facility]  [name] 03/26/2020 Sol Blazing, MD Requesting Physician: Dr Lorin Mercy  Reason for Consult: Renal failure HPI: The patient is a 50 y.o. year-old w/ hx of OHS, morbid obesuity, hypothyroid, HTN, FM, DJD, DM2 on insulin, depression, chronic pain, anemia , AKI presented from home by EMS , recent UTI finished abx then got sick w/ fever, SOB and AMS and jerking of extremities. The jerking started 03/25/20 and has progressed per the family.  The patient is on multiple pain related medications as well as antidepressants, antipsychotic, seizure med (keppra), seizure/ migraine (topomax). In ED pt MS was waxing and waning and pt exhibiting significant myoclonic jerking of UE's , head. Labs showed BUN 39, Creat 4.89, eGFR 10 ml/min.  Ca 8.6. K+ 4.5.  BP's were on the lower side 90's - 110's.  RR 15, HR 90's- 100's.  4L [place] 2 95% sats. CXR showed no acute findings. Pt started on IV insulin.  Also rec'd bolus 2800 cc of LR and is getting D5LR at 125 cc/hr. Started on IV vanc, cefepime for poss sepsis. Asked to see for renal failure.      Past admits:   01/2011 - pressure ulcer stage III, MO, DM2, foot cellulits   03/2011 -  Chronic heel ulcers , I&D per ortho   08/2012 - AKI, creat 7.5 >> 1.3 on dc w/ IVF's.    09/2017 - incarcerated hernia repair   10/2017 - chest pain, hx CV 19, CTA neg for PE   12/14- 03/10/20 - AKI 2.2 >> 0.9 at dc     Pt seen in ED.  Pt is in and out of altered mental status, having ongoing continuous myoclonic jerking of the face, mouth, eyes, UE's. Barely able to speak properly. Will answer simple questions if asked in a loud voice.       ROS  denies CP  no joint pain   no HA  no blurry vision  no rash  no dysuria  no difficulty voiding  no change in urine color    Past Medical History  Past Medical History:  Diagnosis Date  . Acute renal failure (ARF) ([place]) 09/02/2012  . Anemia   . Anxiety    panic attacks   . Back pain   . Blood transfusion   . Chronic heel ulcer ([place])   . Chronic kidney disease   . Chronic pain syndrome   . Depression   . Diabetes mellitus    type II   . DJD (degenerative joint disease)   . Dysrhythmia    pt unsure what this was  . Fibromyalgia   . GERD (gastroesophageal reflux disease)   . H/O: Bell's palsy 2011  . Heart murmur    "slight one"  . History of kidney stones   . History of MRSA infection OF ULCER  . Hypertension    no longer taken since 10-11 months per patient at preop phone call of 10/13/2017   . Hypothyroidism   . Hypoventilation associated with obesity syndrome ([place])   . Migraine   . Non-healing non-surgical wound    Right hip, has Wound vac to hip.  Started as a skin tear.  . Peripheral vascular disease ([place])   . PONV (postoperative nausea and vomiting)    can be slow to wake up after surgery  . Right foot drop   . Shortness of breath    with Activity  . Sleep apnea    "study  shows not bad enough for CPAP.", pt denies  . Umbilical hernia    Past Surgical History  Past Surgical History:  Procedure Laterality Date  . BACK SURGERY     for lumbar disc disease X2  . [place]   Tumor removed- has steel plate  . BRAIN SURGERY      Plating due to soft spot closing too early- age 46  . BREAST LUMPECTOMY WITH NEEDLE LOCALIZATION Right 08/23/2013   Procedure: RIGHT BREAST LUMPECTOMY WITH NEEDLE LOCALIZATION;  Surgeon: Odis Hollingshead, MD;  Location: [facility];  Service: General;  Laterality: Right;  . CHOLECYSTECTOMY  1984  . EXCISION UMBILICAL NODULE N/A 2/48/2500   Procedure: EXCISION OF UMBILICUS;  Surgeon: Coralie Keens, MD;  Location: [facility];  Service: General;  Laterality: N/A;  . EYE SURGERY     eye lift  . I & D EXTREMITY  04/02/2011   Procedure: IRRIGATION AND DEBRIDEMENT EXTREMITY;  Surgeon: Mcarthur Rossetti;  Location: [facility];  Service: Orthopedics;  Laterality: Right;  I&D right heel ulcer, placement of A-cell graft   . INCISION AND DRAINAGE ABSCESS N/A 10/28/2017   Procedure: INCISION AND DRAINAGE UMBILICAL HERNIA ABSCESS;  Surgeon: Ralene Ok, MD;  Location: [facility];  Service: General;  Laterality: N/A;  . INCISION AND DRAINAGE OF WOUND  08/29/2011   Procedure: IRRIGATION AND DEBRIDEMENT WOUND;  Surgeon: Theodoro Kos, DO;  Location: [facility];  Service: Plastics;  Laterality: Right;  . INSERTION OF MESH N/A 10/16/2017   Procedure: INSERTION OF MESH;  Surgeon: Coralie Keens, MD;  Location: [facility];  Service: General;  Laterality: N/A;  . LITHOTRIPSY     2007ish  . right elbow    . UMBILICAL HERNIA REPAIR N/A 10/16/2017   Procedure: UMBILICAL HERNIA REPAIR WITH MESH;  Surgeon: Coralie Keens, MD;  Location: [facility];  Service: General;  Laterality: N/A;   Family History  Family History  Problem Relation Age of Onset  . Diabetes type II Father   . Heart attack Father   . Peripheral vascular disease Father   . Diabetes type II Mother   . Anesthesia problems Mother   . Heart attack Mother   . Peripheral vascular disease Mother   . Hypertension Mother    Social History  reports that she has never smoked. She has never used smokeless tobacco. She reports that she does not drink alcohol and does not use drugs. Allergies  Allergies  Allergen Reactions  . Penicillins Hives and Other (See Comments)    HAS PT DEVELOPED SEVERE RASH INVOLVING MUCUS MEMBRANES or SKIN NECROSIS: #  #  YES  #  # PATIENT HAS HAD A PCN REACTION THAT REQUIRED HOSPITALIZATION:  #  #  YES  #  #   Tolerates amoxicillin on multiple occasions per Dr. Darrel Hoover note 11/01/17.  TDD.  . Clindamycin/Lincomycin Hives, Dermatitis and Rash  . 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 Sulfa Antibiotics Hives  . Versed [Midazolam] Nausea And Vomiting    Pt had medication on October 16 2017 with no issues   Home medications Prior to Admission medications   Medication Sig Start Date End Date Taking?  Authorizing Provider  acetaminophen (TYLENOL) 500 MG tablet Take 1,000 mg by mouth 2 (two) times daily as needed for moderate pain or headache.   Yes [provider]  amitriptyline (ELAVIL) 25 MG tablet Take 25-75 mg by mouth at bedtime. 02/07/20  Yes [provider]  ARIPiprazole (ABILIFY) 5 MG tablet Take 5 mg by mouth at bedtime. 03/04/19  Yes [provider]  atorvastatin (LIPITOR) 20 MG tablet Take 20 mg by mouth daily.   Yes [provider]  baclofen (LIORESAL) 10 MG tablet Take 10 mg by mouth 3 (three) times daily as needed for muscle spasms (or as otherwise directed). 01/13/20  Yes [provider]  carisoprodol (SOMA) 350 MG tablet Take 350 mg by mouth 4 (four) times daily as needed for muscle spasms.   Yes [provider]  cetirizine (ZYRTEC) 10 MG tablet Take 10 mg by mouth daily.   Yes [provider]  Cyanocobalamin (VITAMIN DEFICIENCY SYSTEM-B12) 1000 MCG/ML KIT Inject 1,000 mcg as directed every 30 (thirty) days.   Yes [provider]  Diclofenac Sodium 2 % SOLN Place 2-4 g onto the skin 4 (four) times daily as needed. Patient taking differently: Place 4 g onto the skin 4 (four) times daily as needed (for pain). 10/08/18  Yes Mcarthur Rossetti, MD  DULoxetine (CYMBALTA) 60 MG capsule Take 60 mg by mouth 2 (two) times daily.   Yes [provider]  eletriptan (RELPAX) 40 MG tablet Take 40 mg by mouth as needed for migraine or headache (and may repeat in 2 hours if headache persists or recurs).   Yes [provider]  furosemide (LASIX) 20 MG tablet Take 20 mg by mouth daily.   Yes [provider]  gabapentin (NEURONTIN) 600 MG tablet Take 0.5 tablets (300 mg total) by mouth 3 (three) times daily. Patient taking differently: Take 1,200 mg by mouth 3 (three) times daily. 03/12/20  Yes Domenic Polite, MD  insulin aspart protamine - aspart (NOVOLOG MIX 70/30 FLEXPEN) (70-30) 100 UNIT/ML  FlexPen Inject 75 Units into the skin 2 (two) times daily.   Yes [provider]  levETIRAcetam (KEPPRA) 750 MG tablet Take 750 mg by mouth 2 (two) times daily. 03/04/19  Yes [provider]  levothyroxine (SYNTHROID) 150 MCG tablet Take 150 mcg by mouth daily before breakfast.   Yes [provider]  lidocaine (LIDODERM) 5 % Place 3 patches onto the skin See admin instructions. Place 3 patches daily to affected areas and replace every 24 hours 05/30/17  Yes [provider]  lisinopril (ZESTRIL) 40 MG tablet Take 40 mg by mouth daily.   Yes [provider]  NARCAN 4 MG/0.1ML LIQD nasal spray kit Place 4 mg into the nose daily as needed (opioid overdose). 10/22/17  Yes [provider]  NEXIUM 40 MG capsule Take 40 mg by mouth at bedtime.   Yes [provider]  nystatin (MYCOSTATIN/NYSTOP) 100000 UNIT/GM POWD Apply 1 g topically daily as needed (to affected areas).   Yes [provider]  Omega-3 1000 MG CAPS Take 1,000 mg by mouth 2 (two) times daily.   Yes [provider]  Oxycodone HCl 10 MG TABS Take 10 mg by mouth 4 (four) times daily as needed (for pain).   Yes [provider]  phentermine (ADIPEX-P) 37.5 MG tablet Take 37.5 mg by mouth daily before breakfast.   Yes [provider]  sitaGLIPtin (JANUVIA) 50 MG tablet Take 50 mg by mouth daily.   Yes [provider]  topiramate (TOPAMAX) 200 MG tablet Take 200 mg by mouth 2 (two) times daily.   Yes [provider]  TRULICITY 1.5 UY/3.7QD SOPN Inject 3 mg into the skin every Thursday. 11/24/18  Yes [provider]  Biotin 10000 MCG TABS Take 10,000 mcg by mouth daily.    [provider]  fentaNYL (DURAGESIC) 50 MCG/HR Place 150 mcg onto the skin every 7 (seven) days.    [provider]  hydrOXYzine (VISTARIL) 25 MG capsule Take 25 mg by mouth 3 (three) times daily as needed for anxiety.    [provider]   promethazine (PHENERGAN) 25 MG tablet Take 25 mg by mouth every 6 (six) hours as needed for nausea or vomiting.    [provider]  senna (SENOKOT) 8.6 MG TABS tablet Take 8.6 mg by mouth daily as needed for mild constipation.     [provider]     Vitals:   03/26/20 1615 03/26/20 1630 03/26/20 1645 03/26/20 1700  BP: (!) 119/52 138/84 104/70 (!) 120/101  Pulse: 93 96 100 96  Resp: '11 12 14 12  ' Temp:      TempSrc:      SpO2: 97% 97% 99% 98%  Weight:      Height:       Exam Gen morbid obese, mouth and UE's non-rhythmic jerking/ trembli No rash, cyanosis or gangrene Sclera anicteric, throat clear No jvd or bruits, flat neck veins Chest clear bilat to bases, no rales or wheezing RRR no MRG Abd soft ntnd no mass or ascites +bs very obese GU  defer MS no joint effusions or deformity Ext 1+ bilat pretib edema, no wounds or ulcers Neuro Ox 3, myoclonic jerking as above     Date   Creat  eGFR   2012    1.34 >> 0.9 AKI   2013   0.8- 0.9   2014    7.50 >> 1.30  AKI   2019   0.8- 0.9   03/24/19  1.04    12/14- 03/10/20 2.27 >> 0.94 AKI, treated w/ IVF's    03/26/20  4.89  eGFR 10 ml/min    Home meds:  - novolog  80/03 75 bid/ trulicity 3ug wkly/ januvia 50 qd  - abilify 5/ cymbalta 60 bid/ neurontin 1200 tid/ keppra 750 bid/ oxy IR qid prn/ topamax 200 bid/ duragesic patch 150 ug q7d/ elavil 25-75 hs  - prn baclofen/ prn zyrtec/ prn soma/ prn nracan/ prn vistaril/ prn phenergan  - synthroid 150 ug  - lasix 20 qd/ lipitor 20/ lisinopril 40 mg qd  - prn's/ vitamins/ supplements     UA 1/2 - 30 prot, no bact, 0-5 rbc/ wbc    CXR - IMPRESSION: No active disease.  Shallow degree of aeration.     CT stone study no contrast - Adrenals/Urinary Tract: Normal adrenals. No hydronephrosis. No renal stones. No contour deforming renal masses. Normal caliber ureters. No ureteral stones. Normal nondistended bladder.  Assessment/ Plan: 1. AKI - prob due to vol depletion,  hx of the same on many prior admits. Prob due to uncont DM2 and the osmotic fluid losses associated. Creat baseline 0.9. Creat here 4.89 prob due to vol loss/ hemodynamics +/- ATN. UA unremarkable, CT w/o obstruction. Pt has really bad myoclonic jerking/ asterixis w/ advanced AKI , eGFR 10 ml/min .  Some of this could be medication-related, but uremia is just as likely.  Would recommend CRRT for this patient tonight.  Have d/w CCM/ pmd. Cont IVF"s 100- 125 cc/hr. Will follow.  2. IDDM 3. Depression / chronic pain - on multiple meds, seen by neurology 4. Seizures - on keppra , to continue per neuro 5. Vol depletion - exam difficult, but this  is usually what she comes in with. CXR clear. IVF"s.       Kelly Splinter  MD 03/26/2020, 5:24 PM  Recent Labs  Lab 03/26/20 0130 03/26/20 0824  WBC 14.8*  --   HGB 13.8 12.9   Recent Labs  Lab 03/26/20 0130 03/26/20 0824  K 4.5 4.9  BUN 39*  --   CREATININE 4.89*  --   CALCIUM 8.6*  --

## 2020-03-26 NOTE — Procedures (Signed)
Patient Name: [name]  MRN: [id number]  Epilepsy Attending: Charlsie Quest  Referring Physician/Provider: Dr Caryl Pina Date: 03/26/2020 Duration: 23.50 mins  Patient history: 50 year old female with a history of seizures, on Keppra, presenting with acute encephalopathy and tremor-like movements in the setting of AKI and polypharmacy. EEG to evaluate for seizure.  Level of alertness: Awake, asleep  AEDs during EEG study: LEV  Technical aspects: This EEG study was done with scalp electrodes positioned according to the 10-20 International system of electrode placement. Electrical activity was acquired at a sampling rate of 500Hz  and reviewed with a high frequency filter of 70Hz  and a low frequency filter of 1Hz . EEG data were recorded continuously and digitally stored.   Description: No posterior dominant rhythm was seen. Sleep was characterized by vertex waves, sleep spindles (12 to 14 Hz), maximal frontocentral region.  EEG showed continuous generalized 3 to 6 Hz theta-delta slowing. Multiple episodes of brief whole body twitching were recorded. Concomitant eeg before, during and after the event didn't show any eeg change to suggest seizure.   Hyperventilation and photic stimulation were not performed.     ABNORMALITY -Continuous slow, generalized  IMPRESSION: This study is suggestive of moderate diffuse encephalopathy, nonspecific etiology. No seizures or epileptiform discharges were seen throughout the recording.  Multiple episodes of brief whole body twitching were recorded without concomitant eeg change and were NOT epileptic.   [name] 

## 2020-03-26 NOTE — ED Notes (Signed)
Notified Dr. Ophelia Charter of obtaining bladder scan. Scanned pt three times just to verify as pt had not made any urine since being on shift. Per the bladder scan, 0 ml is noted to be in bladder. Received verbal order for 1 L bolus and to rescan after.

## 2020-03-26 NOTE — H&P (Signed)
History and Physical    [name] FXO:[id number] DOB: 1970/12/10 DOA: 03/26/2020  PCP: Pecolia Ades, NP Consultants:  Ninfa Linden - orthopedics; Alfredo Martinez - [facility] - endocrinology Patient coming from:  Home - lives with boyfriend; NOK: Boyfriend, Gustavo Lah, [phone]  Chief Complaint: AMS  HPI: [name] is a 50 y.o. female with medical history significant of OSA; PVD; OHS; hypothyroidism; HTN; DM; depression/anxiety; and chronic pain presenting with lethargy, AMS.  She was previously admitted from 12/14-19 with AKI, thought to be due to hyperglycemia from uncontrolled DM (A1c 11.9).  She was also thought to have an acute UTI (no culture done) and myoclonus associated with Neurontin.  The patient returns again with AMS and AKI.  Her boyfriend reports that she has chronic pain syndrome and that he is concerned about how much medication she takes.  She has had perseveration and been altered.  She does not wear CPAP/BIPAP and he is unaware of whether she has episodes of apnea at home.  He reports that she has been like this since last night.  The patient is periodically very somnolent with shallow respirations (?baseline) alternating with periods of being alert and able to answer a few questions but perseverates.  She has myoclonic jerks periodically.    ED Course:  Carryover, per Dr. Cyd Silence:  50 y.o. female with medical history significant for depression, anxiety, chronic pain, insulin-dependent diabetes mellitus, and hypertension, obesity and recent hospitalization earlier in the month discharged on 12/19 for acute kidney injury and urinary tract infection. Now being brought in by EMS due to rather rapid onset of lethargy and confusion as well as reported fever and shortness of breath according to EMS notes.patient now found to exhibit multiple SIRS criteria as well as low-grade temperature and mild lactic acidosis of 2.5. Patient found to have recurrent acute kidney  injury with creatinine of 4.89. Urinalysis essentially unremarkable. Chest x-ray unremarkable although it is almost a certainty that the lower lobes are underrepresented due to patient's morbid obesity. Patient is visibly lethargic and confused in the emergency department. Patient has been initiated on broad-spectrum intravenous antibiotics by the emergency department provider as well as intravenous fluids  Review of Systems: Unable to perform  COVID Vaccine Status:  Complete  Past Medical History:  Diagnosis Date  . Acute renal failure (ARF) ([place]) 09/02/2012  . Anemia   . Anxiety    panic attacks  . Back pain   . Blood transfusion   . Chronic heel ulcer ([place])   . Chronic kidney disease   . Chronic pain syndrome   . Depression   . Diabetes mellitus    type II   . DJD (degenerative joint disease)   . Dysrhythmia    pt unsure what this was  . Fibromyalgia   . GERD (gastroesophageal reflux disease)   . H/O: Bell's palsy 2011  . Heart murmur    "slight one"  . History of kidney stones   . History of MRSA infection OF ULCER  . Hypertension    no longer taken since 10-11 months per patient at preop phone call of 10/13/2017   . Hypothyroidism   . Hypoventilation associated with obesity syndrome ([place])   . Migraine   . Non-healing non-surgical wound    Right hip, has Wound vac to hip.  Started as a skin tear.  . Peripheral vascular disease ([place])   . PONV (postoperative nausea and vomiting)    can be slow to wake up after  surgery  . Right foot drop   . Shortness of breath    with Activity  . Sleep apnea    "study shows not bad enough for CPAP.", pt denies  . Umbilical hernia     Past Surgical History:  Procedure Laterality Date  . BACK SURGERY     for lumbar disc disease X2  . [place]   Tumor removed- has steel plate  . BRAIN SURGERY      Plating due to soft spot closing too early- age 63  . BREAST LUMPECTOMY WITH NEEDLE LOCALIZATION Right 08/23/2013    Procedure: RIGHT BREAST LUMPECTOMY WITH NEEDLE LOCALIZATION;  Surgeon: Odis Hollingshead, MD;  Location: [facility];  Service: General;  Laterality: Right;  . CHOLECYSTECTOMY  1984  . EXCISION UMBILICAL NODULE N/A 8/93/8101   Procedure: EXCISION OF UMBILICUS;  Surgeon: Coralie Keens, MD;  Location: [facility];  Service: General;  Laterality: N/A;  . EYE SURGERY     eye lift  . I & D EXTREMITY  04/02/2011   Procedure: IRRIGATION AND DEBRIDEMENT EXTREMITY;  Surgeon: Mcarthur Rossetti;  Location: [facility];  Service: Orthopedics;  Laterality: Right;  I&D right heel ulcer, placement of A-cell graft  . INCISION AND DRAINAGE ABSCESS N/A 10/28/2017   Procedure: INCISION AND DRAINAGE UMBILICAL HERNIA ABSCESS;  Surgeon: Ralene Ok, MD;  Location: [facility];  Service: General;  Laterality: N/A;  . INCISION AND DRAINAGE OF WOUND  08/29/2011   Procedure: IRRIGATION AND DEBRIDEMENT WOUND;  Surgeon: Theodoro Kos, DO;  Location: [facility];  Service: Plastics;  Laterality: Right;  . INSERTION OF MESH N/A 10/16/2017   Procedure: INSERTION OF MESH;  Surgeon: Coralie Keens, MD;  [facility];  Service: General;  Laterality: N/A;  . LITHOTRIPSY     2007ish  . right elbow    . UMBILICAL HERNIA REPAIR N/A 10/16/2017   Procedure: UMBILICAL HERNIA REPAIR WITH MESH;  Surgeon: Coralie Keens, MD;  [facility];  Service: General;  Laterality: N/A;    Social History   Socioeconomic History  . Marital status: Single    Spouse name: Not on file  . Number of children: Not on file  . Years of education: Not on file  . Highest education level: Not on file  Occupational History  . Not on file  Tobacco Use  . Smoking status: Never Smoker  . Smokeless tobacco: Never Used  Vaping Use  . Vaping Use: Never used  Substance and Sexual Activity  . Alcohol use: No  . Drug use: No  . Sexual activity: Never  Other Topics Concern  . Not on file  Social History Narrative  . Not on file   Social Determinants of  Health   Financial Resource Strain: Not on file  Food Insecurity: Not on file  Transportation Needs: Not on file  Physical Activity: Not on file  Stress: Not on file  Social Connections: Not on file  Intimate Partner Violence: Not on file    Allergies  Allergen Reactions  . Penicillins Hives and Other (See Comments)    HAS PT DEVELOPED SEVERE RASH INVOLVING MUCUS MEMBRANES or SKIN NECROSIS: #  #  YES  #  # PATIENT HAS HAD A PCN REACTION THAT REQUIRED HOSPITALIZATION:  #  #  YES  #  #   Tolerates amoxicillin on multiple occasions per Dr. Darrel Hoover note 11/01/17.  TDD.  . Clindamycin/Lincomycin Hives, Dermatitis and Rash  . Nsaids Other (See Comments)    Avoid  due to kidney failure caused by celebrex   . Sulfa Antibiotics Hives  . Versed [Midazolam] Nausea And Vomiting    Pt had medication on October 16 2017 with no issues    Family History  Problem Relation Age of Onset  . Diabetes type II Father   . Heart attack Father   . Peripheral vascular disease Father   . Diabetes type II Mother   . Anesthesia problems Mother   . Heart attack Mother   . Peripheral vascular disease Mother   . Hypertension Mother     Prior to Admission medications   Medication Sig Start Date End Date Taking? Authorizing Provider  acetaminophen (TYLENOL) 500 MG tablet Take 1,000 mg by mouth 2 (two) times daily as needed for moderate pain or headache.    [provider]  amitriptyline (ELAVIL) 25 MG tablet Take 50 mg by mouth at bedtime. 02/07/20   [provider]  ARIPiprazole (ABILIFY) 5 MG tablet Take 5 mg by mouth daily. 03/04/19   [provider]  atorvastatin (LIPITOR) 20 MG tablet Take 20 mg by mouth daily.    [provider]  baclofen (LIORESAL) 10 MG tablet Take 10 mg by mouth 3 (three) times daily. 01/13/20   [provider]  Biotin 10000 MCG TABS Take 10,000 mcg by mouth daily.    [provider]  carisoprodol (SOMA) 350 MG tablet Take 350 mg  by mouth 2 (two) times daily.    [provider]  cetirizine (ZYRTEC) 10 MG tablet Take 10 mg by mouth daily as needed for allergies.    [provider]  Cyanocobalamin (VITAMIN B-12 IJ) Inject 1 each as directed every 30 (thirty) days.    [provider]  Diclofenac Sodium 2 % SOLN Place 2-4 g onto the skin 4 (four) times daily as needed. Patient taking differently: Place 4 g onto the skin 4 (four) times daily as needed (pain). 10/08/18   Mcarthur Rossetti, MD  DULoxetine (CYMBALTA) 60 MG capsule Take 60 mg by mouth 2 (two) times daily.    [provider]  eletriptan (RELPAX) 40 MG tablet Take 40 mg by mouth as needed for migraine or headache. May repeat in 2 hours if headache persists or recurs.    [provider]  esomeprazole (NEXIUM) 40 MG capsule Take 40 mg by mouth daily before breakfast. Name Brand Only    [provider]  fentaNYL (DURAGESIC) 50 MCG/HR Place 50 mcg onto the skin every 3 (three) days.    [provider]  furosemide (LASIX) 20 MG tablet Take 20 mg by mouth daily as needed for fluid.     [provider]  gabapentin (NEURONTIN) 600 MG tablet Take 0.5 tablets (300 mg total) by mouth 3 (three) times daily. 03/12/20   Domenic Polite, MD  hydrOXYzine (VISTARIL) 25 MG capsule Take 25 mg by mouth 3 (three) times daily as needed for anxiety.    [provider]  insulin aspart (NOVOLOG) 100 UNIT/ML FlexPen Inject 75 Units into the skin See admin instructions. Injects 75 units under the skin before breakfast; injects 25 units at lunch; injects 75 units at dinner time    [provider]  levETIRAcetam (KEPPRA) 750 MG tablet Take 750 mg by mouth 2 (two) times daily. 03/04/19   [provider]  levothyroxine (SYNTHROID) 150 MCG tablet Take 150 mcg by mouth daily.    [provider]  lidocaine (LIDODERM) 5 % Place 4 patches onto the skin  daily. 05/30/17   [provider]   lisinopril (ZESTRIL) 20 MG tablet Take 20 mg by mouth daily. 03/02/20   [provider]  NARCAN 4 MG/0.1ML LIQD nasal spray kit Place 4 mg into the nose daily as needed (opioid overdose). 10/22/17   [provider]  nystatin (MYCOSTATIN/NYSTOP) 100000 UNIT/GM POWD Apply 1 g topically daily.    [provider]  Oxycodone HCl 10 MG TABS Take 10 mg by mouth 4 (four) times daily.    [provider]  PE-Triprolidine-DM-GG-APAP [facility] CLEAR & COOL DAY/NIGHT PO) Take 1 tablet by mouth every 6 (six) hours as needed (cold symptoms).    [provider]  Phenyleph-Doxylamine-DM-APAP (NYQUIL SEVERE COLD/FLU) 5-6.25-10-325 MG/15ML LIQD Take 30 mLs by mouth every 8 (eight) hours as needed (cold symptoms).    [provider]  promethazine (PHENERGAN) 25 MG tablet Take 25 mg by mouth every 6 (six) hours as needed for nausea or vomiting.    [provider]  senna (SENOKOT) 8.6 MG TABS tablet Take 8.6 mg by mouth daily as needed for mild constipation.     [provider]  sitaGLIPtin (JANUVIA) 50 MG tablet Take 50 mg by mouth daily.    [provider]  topiramate (TOPAMAX) 200 MG tablet Take 200 mg by mouth 2 (two) times daily.    [provider]  TRULICITY 1.5 IO/9.6EX SOPN Inject 1.5 mg into the skin every Thursday. 11/24/18   [provider]    Physical Exam: Vitals:   03/26/20 1615 03/26/20 1630 03/26/20 1645 03/26/20 1700  BP: (!) 119/52 138/84 104/70 (!) 120/101  Pulse: 93 96 100 96  Resp: _0 Temp:      TempSrc:      SpO2: 97% 97% 99% 98%  Weight:      Height:         . General:  Alternates between obtunded and alert with perseveration; myoclonic jerks occur frequently . Eyes:  EOMI, normal lids, iris . ENT:  grossly normal hearing, lips & tongue . Neck:  no LAD, masses or thyromegaly . Cardiovascular:  RR with mild tachycardia, no m/r/g.  . Respiratory:   CTA bilaterally with no  wheezes/rales/rhonchi.  Normal respiratory effort when awake.  When sleeping, she has more frequent and shallow respirations . Abdomen:  soft, NT, obese . Skin:  no rash or induration seen on limited exam . Musculoskeletal:  grossly normal tone BUE/BLE, no bony abnormality . Lower extremity:  No LE edema.  Limited foot exam with no ulcerations.  2+ distal pulses. Marland Kitchen Psychiatric:  altered mood and affect, speech fluent with perseveration (when trying to describe why she needed pain medication, she repeated "I cry" over and over without ever completing the sentence . Neurologic:  Myoclonic jerks which are fairly diffuse but do seem to preserve the RLE    Radiological Exams on Admission: Independently reviewed - see discussion in A/P where applicable  CT HEAD WO CONTRAST  Result Date: 03/26/2020 CLINICAL DATA:  Delirium. EXAM: CT HEAD WITHOUT CONTRAST TECHNIQUE: Contiguous axial images were obtained from the base of the skull through the vertex without intravenous contrast. COMPARISON:  March 01, 2011 FINDINGS: Brain: No evidence of acute infarction, hemorrhage, hydrocephalus, extra-axial collection or mass lesion/mass effect. Vascular: No hyperdense vessel is noted. Skull: Defect in parietal bone over convexity stable compared to prior exam Sinuses/Orbits: No acute finding. Other: None. IMPRESSION: No focal acute intracranial abnormality identified. Electronically Signed   By: Seward Meth  Augustin Coupe M.D.   On: 03/26/2020 10:58   DG Chest Port 1 View  Result Date: 03/26/2020 CLINICAL DATA:  Sepsis EXAM: PORTABLE CHEST 1 VIEW COMPARISON:  None. FINDINGS: The heart size and mediastinal contours are within normal limits. Overall shallow degree of aeration. Both lungs are clear. The visualized skeletal structures are unremarkable. IMPRESSION: No active disease.  Shallow degree of aeration. Electronically Signed   By: Prudencio Pair M.D.   On: 03/26/2020 01:50   CT RENAL STONE STUDY  Result Date:  03/26/2020 CLINICAL DATA:  Psych pain, fever, dyspnea and confusion. EXAM: CT ABDOMEN AND PELVIS WITHOUT CONTRAST TECHNIQUE: Multidetector CT imaging of the abdomen and pelvis was performed following the standard protocol without IV contrast. COMPARISON:  10/28/2017 CT abdomen/pelvis. FINDINGS: Lower chest: No significant pulmonary nodules or acute consolidative airspace disease. Mildly enlarged 1.2 cm right subcarinal node (series 3/image 10), stable since 2014 CT, most compatible with benign reactive etiology. Hepatobiliary: Normal liver size. No liver mass. Cholecystectomy. No biliary ductal dilatation. Pancreas: Normal, with no mass or duct dilation. Spleen: Normal size. No mass. Adrenals/Urinary Tract: Normal adrenals. No hydronephrosis. No renal stones. No contour deforming renal masses. Normal caliber ureters. No ureteral stones. Normal nondistended bladder. Stomach/Bowel: Normal non-distended stomach. Normal caliber small bowel with no small bowel wall thickening. Normal appendix. Normal large bowel with no diverticulosis, large bowel wall thickening or pericolonic fat stranding. Vascular/Lymphatic: Normal caliber abdominal aorta. Stable mild porta hepatis adenopathy up to 1.1 cm (series 3/image 34). No new pathologically enlarged lymph nodes in the abdomen or pelvis. Reproductive: Grossly normal uterus. Intrauterine device is grossly well-positioned in the uterine cavity. No adnexal mass. Other: No pneumoperitoneum, ascites or focal fluid collection. Musculoskeletal: No aggressive appearing focal osseous lesions. Marked lumbar spondylosis. Stable mixed calcific and fat density 7.8 cm focus in subcutaneous medial left gluteal region, most compatible with fat necrosis. IMPRESSION: 1. No acute abnormality. No nephrolithiasis. No hydronephrosis. No evidence of bowel obstruction or acute bowel inflammation. Normal appendix. 2. Chronic stable mild porta hepatis adenopathy, probably reactive. Electronically Signed    By: Ilona Sorrel M.D.   On: 03/26/2020 04:07    EKG: Independently reviewed.  Sinus tachycardia with rate 121; nonspecific ST changes with no evidence of acute ischemia   Labs on Admission: I have personally reviewed the available labs and imaging studies at the time of the admission.  Pertinent labs:   Na++ 133 CO2 18 Glucose 33 BUN 39/Creatinine 4.89/GFR 10 AST 57/ALT 41 Lactate 2.5 -> 1.4 Procalcitonin 0.95 WBC 14.8 INR 1.1 HCG <5 UA: 50 glucose, 30 protein Blood and urine cultures pending ABG: 7.271/40.1/65/18.5/89%   Assessment/Plan Principal Problem:   Acute metabolic encephalopathy Active Problems:   Morbidly obese ([place])   Diabetes mellitus type 2 with complications, uncontrolled ([place])   Sleep apnea   Hypothyroidism   Chronic pain   Acute renal failure (ARF) ([facility])   Drug induced myoclonus   Acute metabolic encephalopathy -Patient previously admitted for the same, presenting with AMS and myoclonic jerks -She was found to have acute renal failure then and again today -She is having myoclonic jerks which may be associated with Neurontin toxicity -At the time of my evaluation this AM, I consulted PCCM for possible admission; ABG was checked and unremarkable and so she was admitted to progressive care unit -BIPAP was ordered for qhs and with naps due to a probable component of OHS/OSA -Neurology was consulted and recommended EEG, agreed that this is likely metabolic in nature -Nephrology was consulted  and now recommends CRRT -As such, the patient will now be admitted to ICU for CRRT to help clear the metabolites - although Neurontin is not necessarily cleared with dialysis -There was also concern for sepsis due to tachycardia, tachypnea, leukocytosis with borderline hypotension and elevated lactate (which has cleared) - while this may be explained by her renal failure, will continue Cefepime for now.  Vanc is held but was given and will not clear until  HD/CRRT -Holding sedating medications -Negative head CT  Acute renal failure -Likely associated with poor PO intake in the setting of persistent use of Zestril, Lasix, Neurontin - as well as chronic hyperglycemia and possible infection, as above -She has been hydrated but is not making good urine and bladder scan was negative -She was given an additional bolus with plan for repeat bladder scan but is now going to CRRT in the ICU instead  Myoclonus -Likely associated with Neurontin toxicity from poor renal excretion -Hold Neurontin and consider discontinuation since this has now been a recurrent issue -Has underlying seizure disorder so continue Keppra but give IV to ensure delivery -Hold Topamax -EEG pending -Neurology is consulting  Uncontrolled DM -Given her poor glycemic control in addition to other problems, she was started on the Endotool -There may be a mild component of DKA, but her anion gap was normal and her bicarbonate was minimally low -Hold Januvia, Trulicity -Recent I4P was 11.9, indicating long-standing very poor control  Morbid obesity with OSA/OHS -Her hypersomnolence and decreased LOC with shallow respirations during sleep is likely associated with her body habitus -She has a prior diagnosis of OSA in her chart but her boyfriend reports that she has never worn CPAP -Patient was started on BIPAP qhs and with naps -BMI is 60  Chronic pain -I have reviewed this patient in the [place] Controlled Substances Reporting System.  She is receiving medications from multiple providers. -She is not particularly high risk of opioid misuse, diversion, or overdose. -Will continue fentanyl patch to prevent opiate withdrawal; will also continue lidoderm patches -Will hold all other sedating/controlled medications at this time including: Phentermine; Elavil; Abilify; Baclofen; Carisoprodol; Cymbalta; Oxycodone; Vistaril; Phenergan -Consider discontinuation of most of these  medications -Needs very close outpatient f/u with tapering of all sedating medications  Hypothyroidism -Check TSH -Continue Synthroid at current dose for now    Note: This patient has been tested and is negative for the novel coronavirus COVID-19. The patient has been fully vaccinated against COVID-19.   Total critical care time: 95 minutes Critical care time was exclusive of separately billable procedures and treating other patients. Critical care was necessary to treat or prevent imminent or life-threatening deterioration. Critical care was time spent personally by me on the following activities: development of treatment plan with patient and/or surrogate as well as nursing, discussions with consultants, evaluation of patient's response to treatment, examination of patient, obtaining history from patient or surrogate, ordering and performing treatments and interventions, ordering and review of laboratory studies, ordering and review of radiographic studies, pulse oximetry and re-evaluation of patient's condition.    DVT prophylaxis: Heparin Code Status:   Full  Family Communication: Boyfriend was present for f/u evaluation Disposition Plan:  The patient is from: home  Anticipated d/c is to: be determined  Anticipated d/c date will depend on clinical response to treatment, but likely a number of days  Patient is currently: acutely critically ill Consults called:  PCCM; Nephrology; Neurology Admission status:  Admit - It is my clinical opinion that  admission to INPATIENT is reasonable and necessary because of the expectation that this patient will require hospital care that crosses at least 2 midnights to treat this condition based on the medical complexity of the problems presented.  Given the aforementioned information, the predictability of an adverse outcome is felt to be significant.    Karmen Bongo MD [facility]   How to contact the [facility] Attending or Consulting  provider [place] or covering provider during after hours [place], for this patient?  1. Check the care team in [facility] and look for a) attending/consulting [facility] provider listed and b) the [facility] team listed 2. Log into [email or web address] and use [place]'s universal password to access. If you do not have the password, please contact the hospital operator. 3. Locate the [facility] provider you are looking for under [facility] and page to a number that you can be directly reached. 4. If you still have difficulty reaching the provider, please page the [facility] (Director on Call) for the Hospitalists listed on amion for assistance.   03/26/2020, 5:31 PM

## 2020-03-26 NOTE — Progress Notes (Signed)
[facility] PCCM  Nephrology has seen Ms. Seat and feels that she would benefit from CRRT. We will admit her to the ICU and plan for this. Will continue current medical management as outlined by the hospitalist team today. Would prefer to put the HD cath in in the ICU as this will be somewhat high risk given her body habitus and encephalopathy.  Heber [place], MD [place] [facility] Pager: [phone] Cell: [phone] If no response, call [phone]

## 2020-03-26 NOTE — Progress Notes (Signed)
EEG complete - results pending 

## 2020-03-26 NOTE — Plan of Care (Signed)
Spoke with the patient at the bedside about nephrology's plan to do CRRT tonight and she was very resistant to undergoing dialysis or CRRT.  In review of her labs, her last creatinine and BUN were from 1:30am, BUN was 40.   Recent ABG showed K<5.  She was having myoclonic jerks at the bedside, was alert but confused.  No emergent need for HD or CRRT at this moment.  We will plan to draw a repeat set of labs, and place foley, and monitor UOP for a couple hours.  Depending on labs and UOP, will determine need for placing IJ HD catheter and starting HD or CRRT.  Notified nephrology consults of this plan.

## 2020-03-26 NOTE — ED Notes (Signed)
Pt not in room for VS reassessment. 

## 2020-03-26 NOTE — Consult Note (Signed)
NAME:  [name], MRN:  [id number], DOB:  1970-08-17, LOS: 0 ADMISSION DATE:  03/26/2020, CONSULTATION DATE:  03/26/20 REFERRING MD:  Lorin Mercy - [facility] , CHIEF COMPLAINT:  AMS  Brief History:  50 yo on several AEDs at home brought to ED for AMS. Has AKI. Worse AMS in ED -- PCCM consulted   History of Present Illness:  50 yo hx morbid obesity, history of osa / ohs, seizure disorder, chronic pain syndromes, migraine who presented to ED 1/2 with AMS, fever. Of note pt recently admitted 12/14 with increasing tremor and AKI at which time no sz activity was noted and OP neuro follow up recommended. On presentation 1/2, pt significant other reports AMS and notes recent UTI. On arrival to ED, pt complaining of back pain. Started on empiric abx Admitted to [facility].  Labs reveal AKI with Cr. 4.89.  K 4.5, Glu 336, LA 2.5 WBC 14.8  ABG 7.27/40/65 UA not consistent with infection    In ED, worsening AMS and change is respiratory pattern -- PCCM consulted in this setting  Past Medical History:  AKI Anxiety Depression DM2 DJD Fibromyalgia GERD MRSA infection HTN Migraine PVD Foot drop OSA, OHS  Umbilical hernia Morbid obesity  Chronic pain  Hypothyroidism Seizure disorder   Significant Hospital Events:  1/2 presented to ED for AMS. CCM consult for worsening AMS, changing resp pattern. Admit to [facility]  Consults:  PCCM nephro Neuro   Procedures:    Significant Diagnostic Tests:    Micro Data:  1/2 COVID neg 1/2 BCx> 1/2 UCx>   Antimicrobials:  1/2 vanc 1/2 cefepime 1/2 flagyl  Interim History / Subjective:  Mental status has improved, respiratory pattern has improved now that patient is more awake   Objective   Blood pressure (!) 94/54, pulse (!) 103, temperature 98.7 F (37.1 C), temperature source Oral, resp. rate 17, height _0  (1.651 m), weight (!) 163.6 kg, SpO2 92 %.        Intake/Output Summary (Last 24 hours) at 03/26/2020 0837 Last data filed at 03/26/2020  0370 Gross per 24 hour  Intake 3400 ml  Output --  Net 3400 ml   Filed Weights   03/26/20 0224  Weight: (!) 163.6 kg    Examination: General: Morbidly obese middle aged F, reclined in stretcher NAD HENT: NCAT, short neck with redundant tissue. Anicteric sclera Lungs: Distant breath sounds. Symmetrical chest expansion, even, unlabored shallow respirations.  Cardiovascular: rrr distant heart sounds cap refill brisk  Abdomen: Obese, soft, ndnt  Extremities: No obvious joint deformity no cyanosis or clubbing  Neuro: Awake, alert. Oriented x 3. PERRL 37m. No nystagmus.  myoclonic twitching of L foot, LUE, R hand GU: wnl, no foley  Psych:  Calm, cooperative. Intermittent perseverative thoughts and occasional word salad   Resolved Hospital Problem list     Assessment & Plan:   Morbid Obesity Acute respiratory insufficiency with hypoxia Hx Sleep Apnea  -see in hx sleep apnea, pt denies, is not on CPAP at home. -wonder if abnormal breathing in ED when pt somnolent was this  P -might benefit from noct BiPAP -Supplemental O2 for SpO2 > 92% -elevate HOB, IS   Acute encephalopathy, suspect toxic/metabolic -AKI + possible tox of AEDs or other toxidrome?, possible sepsis? Sz? -ABG without hypercarbia  Myoclonus  Hx seizure disorder P -recommend neuro consult, nephro consult  -limit CNS depressing medications  -home AEDs may need to be re-evaluated if felt that these may be cause of acute renal failure,  encephalopathy   AKI -CT renal stone study without stones, hydronephrosis -wonder if 2/2 AEDs    P -nephro consult   -trend renal indices, I/O -if not voiding, probably will need foley. Suspect habitus will make preclude accurate bladder scan  -hold statin   Leukocytosis UA here not suggestive of UTI P -Cx and abx per primary    Chronic pain Fibromyalgia Depression -hold home meds   Hypothyroidism -synthroid   DM2 with hyperglycemia -being started on insulin gtt  in ED, suspect this will be able to be transitioned later today      Thank you for consulting critical care. D/w with primary -- PCCM will sign off. Please re-engage if pt clinical status changes or if further help is needed.   Best practice (evaluated daily)  Diet: NPO Pain/Anxiety/Delirium protocol (if indicated): n/a VAP protocol (if indicated): n/a DVT prophylaxis: SQ hep GI prophylaxis: --  Glucose control: SSI Mobility: rec PT/OT  Disposition:SDU   Goals of Care:  Last date of multidisciplinary goals of care discussion:-- Family and staff present: -- Summary of discussion: -- Follow up goals of care discussion due: -- Code Status: Full   Labs   CBC: Recent Labs  Lab 03/26/20 0130 03/26/20 0824  WBC 14.8*  --   NEUTROABS 9.2*  --   HGB 13.8 12.9  HCT 44.0 38.0  MCV 97.3  --   PLT 279  --     Basic Metabolic Panel: Recent Labs  Lab 03/26/20 0130 03/26/20 0824  NA 133* 130*  K 4.5 4.9  CL 100  --   CO2 18*  --   GLUCOSE 336*  --   BUN 39*  --   CREATININE 4.89*  --   CALCIUM 8.6*  --    GFR: Estimated Creatinine Clearance: 21.4 mL/min (A) (by C-G formula based on SCr of 4.89 mg/dL (H)). Recent Labs  Lab 03/26/20 0130  WBC 14.8*  LATICACIDVEN 2.5*    Liver Function Tests: Recent Labs  Lab 03/26/20 0130  AST 57*  ALT 41  ALKPHOS 109  BILITOT 0.6  PROT 6.8  ALBUMIN 3.6   No results for input(s): LIPASE, AMYLASE in the last 168 hours. No results for input(s): AMMONIA in the last 168 hours.  ABG    Component Value Date/Time   PHART 7.271 (L) 03/26/2020 0824   PCO2ART 40.1 03/26/2020 0824   PO2ART 65 (L) 03/26/2020 0824   HCO3 18.5 (L) 03/26/2020 0824   TCO2 20 (L) 03/26/2020 0824   ACIDBASEDEF 8.0 (H) 03/26/2020 0824   O2SAT 89.0 03/26/2020 0824     Coagulation Profile: Recent Labs  Lab 03/26/20 0130  INR 1.1    Cardiac Enzymes: No results for input(s): CKTOTAL, CKMB, CKMBINDEX, TROPONINI in the last 168  hours.  HbA1C: Hgb A1c MFr Bld  Date/Time Value Ref Range Status  03/08/2020 03:03 AM 11.9 (H) 4.8 - 5.6 % Final    Comment:    (NOTE) Pre diabetes:          5.7%-6.4%  Diabetes:              >6.4%  Glycemic control for   <7.0% adults with diabetes   03/25/2019 02:38 AM 10.9 (H) 4.8 - 5.6 % Final    Comment:    (NOTE) Pre diabetes:          5.7%-6.4% Diabetes:              >6.4% Glycemic control for   <7.0% adults with diabetes  CBG: No results for input(s): GLUCAP in the last 168 hours.  Review of Systems:    Limited due to encephalopathy and perseverating thoughts  Endorses low back pain, fatigue, mild SOB   Past Medical History:  She,  has a past medical history of Acute renal failure (ARF) ([place]) (09/02/2012), Anemia, Anxiety, Back pain, Blood transfusion, Chronic heel ulcer ([place]), Chronic kidney disease, Chronic pain syndrome, Depression, Diabetes mellitus, DJD (degenerative joint disease), Dysrhythmia, Fibromyalgia, GERD (gastroesophageal reflux disease), H/O: Bell's palsy (2011), Heart murmur, History of kidney stones, History of MRSA infection ([facility]), Hypertension, Hypothyroidism, Hypoventilation associated with obesity syndrome ([place]), Migraine, Non-healing non-surgical wound, Peripheral vascular disease ([place]), PONV (postoperative nausea and vomiting), Right foot drop, Shortness of breath, Sleep apnea, and Umbilical hernia.   Surgical History:   Past Surgical History:  Procedure Laterality Date  . BACK SURGERY     for lumbar disc disease X2  . [place]   Tumor removed- has steel plate  . BRAIN SURGERY      Plating due to soft spot closing too early- age 31  . BREAST LUMPECTOMY WITH NEEDLE LOCALIZATION Right 08/23/2013   Procedure: RIGHT BREAST LUMPECTOMY WITH NEEDLE LOCALIZATION;  Surgeon: Odis Hollingshead, MD;  Location: [place];  Service: General;  Laterality: Right;  . CHOLECYSTECTOMY  1984  . EXCISION UMBILICAL NODULE N/A 05/17/3610    Procedure: EXCISION OF UMBILICUS;  Surgeon: Coralie Keens, MD;  Location: [facility];  Service: General;  Laterality: N/A;  . EYE SURGERY     eye lift  . I & D EXTREMITY  04/02/2011   Procedure: IRRIGATION AND DEBRIDEMENT EXTREMITY;  Surgeon: Mcarthur Rossetti;  Location: [place];  Service: Orthopedics;  Laterality: Right;  I&D right heel ulcer, placement of A-cell graft  . INCISION AND DRAINAGE ABSCESS N/A 10/28/2017   Procedure: INCISION AND DRAINAGE UMBILICAL HERNIA ABSCESS;  Surgeon: Ralene Ok, MD;  Location: [place];  Service: General;  Laterality: N/A;  . INCISION AND DRAINAGE OF WOUND  08/29/2011   Procedure: IRRIGATION AND DEBRIDEMENT WOUND;  Surgeon: Theodoro Kos, DO;  Location: [place];  Service: Plastics;  Laterality: Right;  . INSERTION OF MESH N/A 10/16/2017   Procedure: INSERTION OF MESH;  Surgeon: Coralie Keens, MD;  Location: [facility];  Service: General;  Laterality: N/A;  . LITHOTRIPSY     2007ish  . right elbow    . UMBILICAL HERNIA REPAIR N/A 10/16/2017   Procedure: UMBILICAL HERNIA REPAIR WITH MESH;  Surgeon: Coralie Keens, MD;  Location: [facility];  Service: General;  Laterality: N/A;     Social History:   reports that she has never smoked. She has never used smokeless tobacco. She reports that she does not drink alcohol and does not use drugs.   Family History:  Her family history includes Anesthesia problems in her mother; Diabetes type II in her father and mother; Heart attack in her father and mother; Hypertension in her mother; Peripheral vascular disease in her father and mother.   Allergies Allergies  Allergen Reactions  . Penicillins Hives and Other (See Comments)    HAS PT DEVELOPED SEVERE RASH INVOLVING MUCUS MEMBRANES or SKIN NECROSIS: #  #  YES  #  # PATIENT HAS HAD A PCN REACTION THAT REQUIRED HOSPITALIZATION:  #  #  YES  #  #   Tolerates amoxicillin on multiple occasions per Dr. Darrel Hoover note 11/01/17.  TDD.  . Clindamycin/Lincomycin Hives,  Dermatitis and Rash  . Nsaids Other (See Comments)  Avoid due to kidney failure caused by celebrex   . Sulfa Antibiotics Hives  . Versed [Midazolam] Nausea And Vomiting    Pt had medication on October 16 2017 with no issues     Home Medications  Prior to Admission medications   Medication Sig Start Date End Date Taking? Authorizing Provider  acetaminophen (TYLENOL) 500 MG tablet Take 1,000 mg by mouth 2 (two) times daily as needed for moderate pain or headache.    [provider]  amitriptyline (ELAVIL) 25 MG tablet Take 50 mg by mouth at bedtime. 02/07/20   [provider]  ARIPiprazole (ABILIFY) 5 MG tablet Take 5 mg by mouth daily. 03/04/19   [provider]  atorvastatin (LIPITOR) 20 MG tablet Take 20 mg by mouth daily.    [provider]  baclofen (LIORESAL) 10 MG tablet Take 10 mg by mouth 3 (three) times daily. 01/13/20   [provider]  Biotin 10000 MCG TABS Take 10,000 mcg by mouth daily.    [provider]  carisoprodol (SOMA) 350 MG tablet Take 350 mg by mouth 2 (two) times daily.    [provider]  cetirizine (ZYRTEC) 10 MG tablet Take 10 mg by mouth daily as needed for allergies.    [provider]  Cyanocobalamin (VITAMIN B-12 IJ) Inject 1 each as directed every 30 (thirty) days.    [provider]  Diclofenac Sodium 2 % SOLN Place 2-4 g onto the skin 4 (four) times daily as needed. Patient taking differently: Place 4 g onto the skin 4 (four) times daily as needed (pain). 10/08/18   Mcarthur Rossetti, MD  DULoxetine (CYMBALTA) 60 MG capsule Take 60 mg by mouth 2 (two) times daily.    [provider]  eletriptan (RELPAX) 40 MG tablet Take 40 mg by mouth as needed for migraine or headache. May repeat in 2 hours if headache persists or recurs.    [provider]  esomeprazole (NEXIUM) 40 MG capsule Take 40 mg by mouth daily before breakfast. Name Brand Only    [provider]  fentaNYL (DURAGESIC) 50 MCG/HR Place 50 mcg onto the skin every 3 (three) days.    [provider]  furosemide (LASIX) 20 MG tablet Take 20 mg by mouth daily as needed for fluid.     [provider]  gabapentin (NEURONTIN) 600 MG tablet Take 0.5 tablets (300 mg total) by mouth 3 (three) times daily. 03/12/20   Domenic Polite, MD  hydrOXYzine (VISTARIL) 25 MG capsule Take 25 mg by mouth 3 (three) times daily as needed for anxiety.    [provider]  insulin aspart (NOVOLOG) 100 UNIT/ML FlexPen Inject 75 Units into the skin See admin instructions. Injects 75 units under the skin before breakfast; injects 25 units at lunch; injects 75 units at dinner time    [provider]  levETIRAcetam (KEPPRA) 750 MG tablet Take 750 mg by mouth 2 (two) times daily. 03/04/19   [provider]  levothyroxine (SYNTHROID) 150 MCG tablet Take 150 mcg by mouth daily.    [provider]  lidocaine (LIDODERM) 5 % Place 4 patches onto the skin daily. 05/30/17   [provider]  lisinopril (ZESTRIL) 20 MG tablet Take 20 mg by mouth daily. 03/02/20   [provider]  NARCAN 4 MG/0.1ML LIQD nasal spray kit Place 4 mg into the nose daily as needed (opioid overdose). 10/22/17   [provider]  nystatin (MYCOSTATIN/NYSTOP) 100000 UNIT/GM POWD Apply  1 g topically daily.    [provider]  Oxycodone HCl 10 MG TABS Take 10 mg by mouth 4 (four) times daily.    [provider]  PE-Triprolidine-DM-GG-APAP [facility] CLEAR & COOL DAY/NIGHT PO) Take 1 tablet by mouth every 6 (six) hours as needed (cold symptoms).    [provider]  Phenyleph-Doxylamine-DM-APAP (NYQUIL SEVERE COLD/FLU) 5-6.25-10-325 MG/15ML LIQD Take 30 mLs by mouth every 8 (eight) hours as needed (cold symptoms).    [provider]  promethazine (PHENERGAN) 25 MG tablet Take 25 mg by mouth every 6 (six) hours as needed for nausea or vomiting.     [provider]  senna (SENOKOT) 8.6 MG TABS tablet Take 8.6 mg by mouth daily as needed for mild constipation.     [provider]  sitaGLIPtin (JANUVIA) 50 MG tablet Take 50 mg by mouth daily.    [provider]  topiramate (TOPAMAX) 200 MG tablet Take 200 mg by mouth 2 (two) times daily.    [provider]  TRULICITY 1.5 HY/8.5OY SOPN Inject 1.5 mg into the skin every Thursday. 11/24/18   [provider]     CCT- Maria Antonia MSN, AGACNP-BC [place] [phone] If no answer, [phone] 03/26/2020, 9:19 AM

## 2020-03-26 NOTE — ED Notes (Signed)
Notified Dr. Ophelia Charter of endotool advising to administer 23 units of insulin after last CBG check of 325. Pt previously only on 9 units but did have a quick increase since starting D5LR when CBG was previously 204. Dr. Ophelia Charter gave ok to administer 23 units as endotool advised.

## 2020-03-26 NOTE — ED Provider Notes (Signed)
[facility] EMERGENCY DEPARTMENT Provider Note   CSN: [id number] Arrival date & time: 03/26/20  0057     History Chief Complaint  Patient presents with  . Fever  . Shortness of Breath  . Altered Mental Status    [name] is a 50 y.o. female.  Patient comes to the emergency department by ambulance from home.  Patient's significant other reports that she became acutely confused tonight.  Patient found to be febrile by EMS.  Patient reportedly recently finished antibiotics for a urinary tract infection.  At arrival, patient complaining of back pain.  She reports history of chronic back pain, however.         Past Medical History:  Diagnosis Date  . Acute renal failure (ARF) ([place]) 09/02/2012  . Anemia   . Anxiety    panic attacks  . Back pain   . Blood transfusion   . Chronic heel ulcer ([place])   . Chronic kidney disease   . Chronic pain syndrome   . Depression   . Diabetes mellitus    type II   . DJD (degenerative joint disease)   . Dysrhythmia    pt unsure what this was  . Fibromyalgia   . GERD (gastroesophageal reflux disease)   . H/O: Bell's palsy 2011  . Heart murmur    "slight one"  . History of kidney stones   . History of MRSA infection OF ULCER  . Hypertension    no longer taken since 10-11 months per patient at preop phone call of 10/13/2017   . Hypothyroidism   . Hypoventilation associated with obesity syndrome ([place])   . Migraine   . Non-healing non-surgical wound    Right hip, has Wound vac to hip.  Started as a skin tear.  . Peripheral vascular disease ([place])   . PONV (postoperative nausea and vomiting)    can be slow to wake up after surgery  . Right foot drop   . Shortness of breath    with Activity  . Sleep apnea    "study shows not bad enough for CPAP.", pt denies  . Umbilical hernia   . URI (upper respiratory infection) 03/05/2011    Patient Active Problem List   Diagnosis Date Noted  . AKI (acute kidney injury)  ([place]) 03/07/2020  . Seizures ([place]) 03/07/2020  . Acute lower UTI 03/07/2020  . Tremor 03/07/2020  . History of 2019 novel coronavirus disease (COVID-19) 03/25/2019  . Depression with anxiety 03/25/2019  . Chest pain 03/24/2019  . Postoperative abscess 10/27/2017  . Umbilical hernia, incarcerated, s/p repair 10/16/2017 10/16/2017  . Chronic pain of both knees 04/23/2017  . Unilateral primary osteoarthritis, left knee 10/24/2016  . Unilateral primary osteoarthritis, right knee 10/24/2016  . Abnormal mammogram with microcalcification-Right inner lower quadrant 07/21/2013  . Oliguria and anuria 09/02/2012  . Constipation 03/02/2011  . Lethargy 03/01/2011  . Heel ulcer due to DM ([place]) 02/19/2011  . Wound, open, hip or thigh 02/19/2011  . Healing pressure ulcer stage III ([place]) 02/18/2011  . Morbidly obese ([place]) 02/18/2011  . Diabetes mellitus type 2 with complications, uncontrolled ([place]) 02/18/2011  . Sleep apnea 02/18/2011  . Hypothyroidism 02/18/2011  . Chronic pain 02/18/2011  . Acute lymphangitis 02/18/2011  . Cellulitis of foot 02/18/2011    Past Surgical History:  Procedure Laterality Date  . BACK SURGERY     for lumbar disc disease X2  . [place]   Tumor removed- has steel plate  .  BRAIN SURGERY      Plating due to soft spot closing too early- age 38  . BREAST LUMPECTOMY WITH NEEDLE LOCALIZATION Right 08/23/2013   Procedure: RIGHT BREAST LUMPECTOMY WITH NEEDLE LOCALIZATION;  Surgeon: Odis Hollingshead, MD;  Location: [facility];  Service: General;  Laterality: Right;  . CHOLECYSTECTOMY  1984  . EXCISION UMBILICAL NODULE N/A 5/46/5681   Procedure: EXCISION OF UMBILICUS;  Surgeon: Coralie Keens, MD;  Location: [facility];  Service: General;  Laterality: N/A;  . EYE SURGERY     eye lift  . I & D EXTREMITY  04/02/2011   Procedure: IRRIGATION AND DEBRIDEMENT EXTREMITY;  Surgeon: Mcarthur Rossetti;  Location: [facility];  Service: Orthopedics;  Laterality: Right;  I&D right  heel ulcer, placement of A-cell graft  . INCISION AND DRAINAGE ABSCESS N/A 10/28/2017   Procedure: INCISION AND DRAINAGE UMBILICAL HERNIA ABSCESS;  Surgeon: Ralene Ok, MD;  Location: [facility];  Service: General;  Laterality: N/A;  . INCISION AND DRAINAGE OF WOUND  08/29/2011   Procedure: IRRIGATION AND DEBRIDEMENT WOUND;  Surgeon: Theodoro Kos, DO;  Location: [facility];  Service: Plastics;  Laterality: Right;  . INSERTION OF MESH N/A 10/16/2017   Procedure: INSERTION OF MESH;  Surgeon: Coralie Keens, MD;  Location: [facility];  Service: General;  Laterality: N/A;  . LITHOTRIPSY     2007ish  . right elbow    . UMBILICAL HERNIA REPAIR N/A 10/16/2017   Procedure: UMBILICAL HERNIA REPAIR WITH MESH;  Surgeon: Coralie Keens, MD;  Location: [facility];  Service: General;  Laterality: N/A;     OB History   No obstetric history on file.     Family History  Problem Relation Age of Onset  . Diabetes type II Father   . Heart attack Father   . Peripheral vascular disease Father   . Diabetes type II Mother   . Anesthesia problems Mother   . Heart attack Mother   . Peripheral vascular disease Mother   . Hypertension Mother     Social History   Tobacco Use  . Smoking status: Never Smoker  . Smokeless tobacco: Never Used  Vaping Use  . Vaping Use: Never used  Substance Use Topics  . Alcohol use: No  . Drug use: No    Home Medications Prior to Admission medications   Medication Sig Start Date End Date Taking? Authorizing Provider  acetaminophen (TYLENOL) 500 MG tablet Take 1,000 mg by mouth 2 (two) times daily as needed for moderate pain or headache.    [provider]  amitriptyline (ELAVIL) 25 MG tablet Take 50 mg by mouth at bedtime. 02/07/20   [provider]  ARIPiprazole (ABILIFY) 5 MG tablet Take 5 mg by mouth daily. 03/04/19   [provider]  atorvastatin (LIPITOR) 20 MG tablet Take 20 mg by mouth daily.    [provider]  baclofen (LIORESAL)  10 MG tablet Take 10 mg by mouth 3 (three) times daily. 01/13/20   [provider]  Biotin 10000 MCG TABS Take 10,000 mcg by mouth daily.    [provider]  carisoprodol (SOMA) 350 MG tablet Take 350 mg by mouth 2 (two) times daily.    [provider]  cetirizine (ZYRTEC) 10 MG tablet Take 10 mg by mouth daily as needed for allergies.    [provider]  Cyanocobalamin (VITAMIN B-12 IJ) Inject 1 each as directed every 30 (thirty) days.    [provider]  Diclofenac Sodium 2 % SOLN  Place 2-4 g onto the skin 4 (four) times daily as needed. Patient taking differently: Place 4 g onto the skin 4 (four) times daily as needed (pain). 10/08/18   Mcarthur Rossetti, MD  DULoxetine (CYMBALTA) 60 MG capsule Take 60 mg by mouth 2 (two) times daily.    [provider]  eletriptan (RELPAX) 40 MG tablet Take 40 mg by mouth as needed for migraine or headache. May repeat in 2 hours if headache persists or recurs.    [provider]  esomeprazole (NEXIUM) 40 MG capsule Take 40 mg by mouth daily before breakfast. Name Brand Only    [provider]  fentaNYL (DURAGESIC) 50 MCG/HR Place 50 mcg onto the skin every 3 (three) days.    [provider]  furosemide (LASIX) 20 MG tablet Take 20 mg by mouth daily as needed for fluid.     [provider]  gabapentin (NEURONTIN) 600 MG tablet Take 0.5 tablets (300 mg total) by mouth 3 (three) times daily. 03/12/20   Domenic Polite, MD  hydrOXYzine (VISTARIL) 25 MG capsule Take 25 mg by mouth 3 (three) times daily as needed for anxiety.    [provider]  insulin aspart (NOVOLOG) 100 UNIT/ML FlexPen Inject 75 Units into the skin See admin instructions. Injects 75 units under the skin before breakfast; injects 25 units at lunch; injects 75 units at dinner time    [provider]  levETIRAcetam (KEPPRA) 750 MG tablet Take 750 mg by mouth 2 (two) times daily. 03/04/19    [provider]  levothyroxine (SYNTHROID) 150 MCG tablet Take 150 mcg by mouth daily.    [provider]  lidocaine (LIDODERM) 5 % Place 4 patches onto the skin daily. 05/30/17   [provider]  lisinopril (ZESTRIL) 20 MG tablet Take 20 mg by mouth daily. 03/02/20   [provider]  NARCAN 4 MG/0.1ML LIQD nasal spray kit Place 4 mg into the nose daily as needed (opioid overdose). 10/22/17   [provider]  nystatin (MYCOSTATIN/NYSTOP) 100000 UNIT/GM POWD Apply 1 g topically daily.    [provider]  Oxycodone HCl 10 MG TABS Take 10 mg by mouth 4 (four) times daily.    [provider]  PE-Triprolidine-DM-GG-APAP [facility] CLEAR & COOL DAY/NIGHT PO) Take 1 tablet by mouth every 6 (six) hours as needed (cold symptoms).    [provider]  Phenyleph-Doxylamine-DM-APAP (NYQUIL SEVERE COLD/FLU) 5-6.25-10-325 MG/15ML LIQD Take 30 mLs by mouth every 8 (eight) hours as needed (cold symptoms).    [provider]  promethazine (PHENERGAN) 25 MG tablet Take 25 mg by mouth every 6 (six) hours as needed for nausea or vomiting.    [provider]  senna (SENOKOT) 8.6 MG TABS tablet Take 8.6 mg by mouth daily as needed for mild constipation.     [provider]  sitaGLIPtin (JANUVIA) 50 MG tablet Take 50 mg by mouth daily.    [provider]  topiramate (TOPAMAX) 200 MG tablet Take 200 mg by mouth 2 (two) times daily.    [provider]  TRULICITY 1.5 JM/4.2AS SOPN Inject 1.5 mg into the skin every Thursday. 11/24/18   [provider]    Allergies    Penicillins, Clindamycin/lincomycin, Nsaids, Sulfa antibiotics, and Versed [midazolam]  Review of Systems   Review of Systems  Physical Exam Updated Vital Signs BP (!) 92/58   Pulse (!) 105   Temp 98.7 F (37.1 C) (Oral)   Resp 14  Ht _0  (1.651 m)   Wt (!) 163.6 kg   SpO2 94%   BMI 60.02 kg/m   Physical Exam  ED Results /  Procedures / Treatments   Labs (all labs ordered are listed, but only abnormal results are displayed) Labs Reviewed  LACTIC ACID, PLASMA - Abnormal; Notable for the following components:      Result Value   Lactic Acid, Venous 2.5 (*)    All other components within normal limits  COMPREHENSIVE METABOLIC PANEL - Abnormal; Notable for the following components:   Sodium 133 (*)    CO2 18 (*)    Glucose, Bld 336 (*)    BUN 39 (*)    Creatinine, Ser 4.89 (*)    Calcium 8.6 (*)    AST 57 (*)    GFR, Estimated 10 (*)    All other components within normal limits  CBC WITH DIFFERENTIAL/PLATELET - Abnormal; Notable for the following components:   WBC 14.8 (*)    Neutro Abs 9.2 (*)    Monocytes Absolute 1.5 (*)    Abs Immature Granulocytes 0.08 (*)    All other components within normal limits  URINALYSIS, ROUTINE W REFLEX MICROSCOPIC - Abnormal; Notable for the following components:   Color, Urine AMBER (*)    APPearance HAZY (*)    Glucose, UA 50 (*)    Bilirubin Urine SMALL (*)    Protein, ur 30 (*)    All other components within normal limits  CULTURE, BLOOD (ROUTINE X 2)  CULTURE, BLOOD (ROUTINE X 2)  URINE CULTURE  RESP PANEL BY RT-PCR (FLU A&B, COVID) ARPGX2  PROTIME-INR  APTT  LACTIC ACID, PLASMA  I-STAT BETA HCG BLOOD, ED ([facility], [facility], [facility] ONLY)    EKG EKG Interpretation  Date/Time:  Sunday March 26 2020 01:06:12 EST Ventricular Rate:  121 PR Interval:    QRS Duration: 95 QT Interval:  308 QTC Calculation: 437 R Axis:   15 Text Interpretation: Sinus tachycardia Low voltage, precordial leads Nonspecific T abnormalities, lateral leads Baseline wander in lead(s) V1 Confirmed by Orpah Greek [phone]) on 03/26/2020 1:33:36 AM   Radiology DG Chest Port 1 View  Result Date: 03/26/2020 CLINICAL DATA:  Sepsis EXAM: PORTABLE CHEST 1 VIEW COMPARISON:  None. FINDINGS: The heart size and mediastinal contours are within normal limits. Overall shallow degree of aeration. Both  lungs are clear. The visualized skeletal structures are unremarkable. IMPRESSION: No active disease.  Shallow degree of aeration. Electronically Signed   By: Prudencio Pair M.D.   On: 03/26/2020 01:50   CT RENAL STONE STUDY  Result Date: 03/26/2020 CLINICAL DATA:  Psych pain, fever, dyspnea and confusion. EXAM: CT ABDOMEN AND PELVIS WITHOUT CONTRAST TECHNIQUE: Multidetector CT imaging of the abdomen and pelvis was performed following the standard protocol without IV contrast. COMPARISON:  10/28/2017 CT abdomen/pelvis. FINDINGS: Lower chest: No significant pulmonary nodules or acute consolidative airspace disease. Mildly enlarged 1.2 cm right subcarinal node (series 3/image 10), stable since 2014 CT, most compatible with benign reactive etiology. Hepatobiliary: Normal liver size. No liver mass. Cholecystectomy. No biliary ductal dilatation. Pancreas: Normal, with no mass or duct dilation. Spleen: Normal size. No mass. Adrenals/Urinary Tract: Normal adrenals. No hydronephrosis. No renal stones. No contour deforming renal masses. Normal caliber ureters. No ureteral stones. Normal nondistended bladder. Stomach/Bowel: Normal non-distended stomach. Normal caliber small bowel with no small bowel wall thickening. Normal appendix. Normal large bowel with no diverticulosis, large bowel wall thickening or pericolonic fat stranding. Vascular/Lymphatic: Normal caliber abdominal  aorta. Stable mild porta hepatis adenopathy up to 1.1 cm (series 3/image 34). No new pathologically enlarged lymph nodes in the abdomen or pelvis. Reproductive: Grossly normal uterus. Intrauterine device is grossly well-positioned in the uterine cavity. No adnexal mass. Other: No pneumoperitoneum, ascites or focal fluid collection. Musculoskeletal: No aggressive appearing focal osseous lesions. Marked lumbar spondylosis. Stable mixed calcific and fat density 7.8 cm focus in subcutaneous medial left gluteal region, most compatible with fat necrosis.  IMPRESSION: 1. No acute abnormality. No nephrolithiasis. No hydronephrosis. No evidence of bowel obstruction or acute bowel inflammation. Normal appendix. 2. Chronic stable mild porta hepatis adenopathy, probably reactive. Electronically Signed   By: Ilona Sorrel M.D.   On: 03/26/2020 04:07    Procedures Procedures (including critical care time)  Medications Ordered in ED Medications  vancomycin (VANCOREADY) IVPB 2000 mg/400 mL (2,000 mg Intravenous New Bag/Given 03/26/20 0437)  ceFEPIme (MAXIPIME) 2 g in sodium chloride 0.9 % 100 mL IVPB (has no administration in time range)  vancomycin variable dose per unstable renal function (pharmacist dosing) (has no administration in time range)  metroNIDAZOLE (FLAGYL) IVPB 500 mg (0 mg Intravenous Stopped 03/26/20 0339)  ceFEPIme (MAXIPIME) 2 g in sodium chloride 0.9 % 100 mL IVPB (0 g Intravenous Stopped 03/26/20 0220)  lactated ringers bolus 1,000 mL (0 mLs Intravenous Stopped 03/26/20 0538)    And  lactated ringers bolus 800 mL (0 mLs Intravenous Stopped 03/26/20 0538)  lactated ringers bolus 1,000 mL (1,000 mLs Intravenous New Bag/Given 03/26/20 0540)    ED Course  I have reviewed the triage vital signs and the nursing notes.  Pertinent labs & imaging results that were available during my care of the patient were reviewed by me and considered in my medical decision making (see chart for details).    MDM Rules/Calculators/A&P                          Patient presents to the emergency department for evaluation of fever and confusion.  Symptoms reportedly began earlier today.  Patient recently treated for UTI.  At arrival to the emergency department, patient is very confused.  She is having difficulty furnishing further information.  She does complain of back pain but reports chronic back pain.  Patient hypertensive at arrival but blood pressure did become somewhat soft.  Administered IV fluid bolus.  Based on her fever and presentation, sepsis treatment  initiated.  Initial lactic acid mildly elevated at 2.5.  She was given broad-spectrum antibiotics at arrival.  At this point, etiology of fever is unclear.  No source found.  Chest x-ray is clear.  Urinalysis does not suggest recurrent infection.  She is, however, found to have significant acute kidney injury.  Based on the back pain, CT renal stone study performed to further evaluate.  No pathology noted.  Patient did seem mildly delirious at arrival.  She was complaining of possible seizure.  She had what appeared to be rigors with mild mild clonus intermittently.  Reviewing records from most recent hospitalization reveals all of these findings were present with her previous UTI admission.  She had MRI of brain and EEG performed at that time.  Ultimately it was thought she had increased myoclonus secondary to decreased gabapentin clearance secondary to acute kidney injury, which is once again seen today.  Patient is delirious and this is also likely affected by the large amount of narcotics she is taking.  She has no focal findings, no concern for  stroke.  Neck is supple, no findings that would be concern for meningitis.  Again the symptoms have been seen with previous admissions.  CRITICAL CARE Performed by: Orpah Greek   Total critical care time: 30 minutes  Critical care time was exclusive of separately billable procedures and treating other patients.  Critical care was necessary to treat or prevent imminent or life-threatening deterioration.  Critical care was time spent personally by me on the following activities: development of treatment plan with patient and/or surrogate as well as nursing, discussions with consultants, evaluation of patient's response to treatment, examination of patient, obtaining history from patient or surrogate, ordering and performing treatments and interventions, ordering and review of laboratory studies, ordering and review of radiographic studies, pulse  oximetry and re-evaluation of patient's condition.   Final Clinical Impression(s) / ED Diagnoses Final diagnoses:  Acute encephalopathy  AKI (acute kidney injury) ([place])  Febrile illness    Rx / DC Orders ED Discharge Orders    None       Haelyn Forgey, Gwenyth Allegra, MD 03/26/20 [phone]

## 2020-03-26 NOTE — ED Triage Notes (Signed)
Pt from home by EMS; hx of UTI, finished abx, then became sick with fever, SOB, and confusion.

## 2020-03-26 NOTE — Consult Note (Signed)
NEURO HOSPITALIST CONSULT NOTE   Requestig physician: Dr. Lorin Mercy  Reason for Consult: Acute encephalopathy  History obtained from:  Patient and Chart     HPI:                                                                                                                                          [name] is a 50 y.o. female with supermorbid obesity, seizures and additional complex PMHx documented below who presents with acute encephalopathy. The patient has waxing and waning LOC during assessment, therefore history provided by her is fragmentory. She is able to state that she last had a seizure about 1.5 weeks ago. She states that her tremor and cognition started to get worse recently after she was prescribed with a "seizure medicine" by her outpatient Neurologist. Other attempts to obtain information from her are unsuccessful.   Per EDP note: "Patient comes to the emergency department by ambulance from home.  Patient's significant other reports that she became acutely confused tonight.  Patient found to be febrile by EMS.  Patient reportedly recently finished antibiotics for a urinary tract infection.  At arrival, patient complaining of back pain.  She reports history of chronic back pain, however."  Past Medical History:  Diagnosis Date  . Acute renal failure (ARF) ([place]) 09/02/2012  . Anemia   . Anxiety    panic attacks  . Back pain   . Blood transfusion   . Chronic heel ulcer ([place])   . Chronic kidney disease   . Chronic pain syndrome   . Depression   . Diabetes mellitus    type II   . DJD (degenerative joint disease)   . Dysrhythmia    pt unsure what this was  . Fibromyalgia   . GERD (gastroesophageal reflux disease)   . H/O: Bell's palsy 2011  . Heart murmur    "slight one"  . History of kidney stones   . History of MRSA infection OF ULCER  . Hypertension    no longer taken since 10-11 months per patient at preop phone call of 10/13/2017   .  Hypothyroidism   . Hypoventilation associated with obesity syndrome ([place])   . Migraine   . Non-healing non-surgical wound    Right hip, has Wound vac to hip.  Started as a skin tear.  . Peripheral vascular disease ([place])   . PONV (postoperative nausea and vomiting)    can be slow to wake up after surgery  . Right foot drop   . Shortness of breath    with Activity  . Sleep apnea    "study shows not bad enough for CPAP.", pt denies  . Umbilical hernia     Past Surgical History:  Procedure Laterality Date  . BACK SURGERY  for lumbar disc disease X2  . [place]   Tumor removed- has steel plate  . BRAIN SURGERY      Plating due to soft spot closing too early- age 58  . BREAST LUMPECTOMY WITH NEEDLE LOCALIZATION Right 08/23/2013   Procedure: RIGHT BREAST LUMPECTOMY WITH NEEDLE LOCALIZATION;  Surgeon: Odis Hollingshead, MD;  Location: [facility];  Service: General;  Laterality: Right;  . CHOLECYSTECTOMY  1984  . EXCISION UMBILICAL NODULE N/A 8/93/7342   Procedure: EXCISION OF UMBILICUS;  Surgeon: Coralie Keens, MD;  Location: [facility];  Service: General;  Laterality: N/A;  . EYE SURGERY     eye lift  . I & D EXTREMITY  04/02/2011   Procedure: IRRIGATION AND DEBRIDEMENT EXTREMITY;  Surgeon: Mcarthur Rossetti;  Location: [facility];  Service: Orthopedics;  Laterality: Right;  I&D right heel ulcer, placement of A-cell graft  . INCISION AND DRAINAGE ABSCESS N/A 10/28/2017   Procedure: INCISION AND DRAINAGE UMBILICAL HERNIA ABSCESS;  Surgeon: Ralene Ok, MD;  Location: [facility];  Service: General;  Laterality: N/A;  . INCISION AND DRAINAGE OF WOUND  08/29/2011   Procedure: IRRIGATION AND DEBRIDEMENT WOUND;  Surgeon: Theodoro Kos, DO;  Location: [facility];  Service: Plastics;  Laterality: Right;  . INSERTION OF MESH N/A 10/16/2017   Procedure: INSERTION OF MESH;  Surgeon: Coralie Keens, MD;  Location: [facility];  Service: General;  Laterality: N/A;  . LITHOTRIPSY     2007ish  . right  elbow    . UMBILICAL HERNIA REPAIR N/A 10/16/2017   Procedure: UMBILICAL HERNIA REPAIR WITH MESH;  Surgeon: Coralie Keens, MD;  Location: [facility];  Service: General;  Laterality: N/A;    Family History  Problem Relation Age of Onset  . Diabetes type II Father   . Heart attack Father   . Peripheral vascular disease Father   . Diabetes type II Mother   . Anesthesia problems Mother   . Heart attack Mother   . Peripheral vascular disease Mother   . Hypertension Mother               Social History:  reports that she has never smoked. She has never used smokeless tobacco. She reports that she does not drink alcohol and does not use drugs.  Allergies  Allergen Reactions  . Penicillins Hives and Other (See Comments)    HAS PT DEVELOPED SEVERE RASH INVOLVING MUCUS MEMBRANES or SKIN NECROSIS: #  #  YES  #  # PATIENT HAS HAD A PCN REACTION THAT REQUIRED HOSPITALIZATION:  #  #  YES  #  #   Tolerates amoxicillin on multiple occasions per Dr. Darrel Hoover note 11/01/17.  TDD.  . Clindamycin/Lincomycin Hives, Dermatitis and Rash  . 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 Sulfa Antibiotics Hives  . Versed [Midazolam] Nausea And Vomiting    Pt had medication on October 16 2017 with no issues    MEDICATIONS:  No current facility-administered medications on file prior to encounter.   Current Outpatient Medications on File Prior to Encounter  Medication Sig Dispense Refill  . acetaminophen (TYLENOL) 500 MG tablet Take 1,000 mg by mouth 2 (two) times daily as needed for moderate pain or headache.    Marland Kitchen amitriptyline (ELAVIL) 25 MG tablet Take 50 mg by mouth at bedtime.    . ARIPiprazole (ABILIFY) 5 MG tablet Take 5 mg by mouth daily.    Marland Kitchen atorvastatin (LIPITOR) 20 MG tablet Take 20 mg by mouth daily.    . baclofen (LIORESAL) 10 MG tablet Take 10  mg by mouth 3 (three) times daily.    . Biotin 10000 MCG TABS Take 10,000 mcg by mouth daily.    . carisoprodol (SOMA) 350 MG tablet Take 350 mg by mouth 2 (two) times daily.    . cetirizine (ZYRTEC) 10 MG tablet Take 10 mg by mouth daily as needed for allergies.    . Cyanocobalamin (VITAMIN B-12 IJ) Inject 1 each as directed every 30 (thirty) days.    . Diclofenac Sodium 2 % SOLN Place 2-4 g onto the skin 4 (four) times daily as needed. (Patient taking differently: Place 4 g onto the skin 4 (four) times daily as needed (pain).) 200 g 3  . DULoxetine (CYMBALTA) 60 MG capsule Take 60 mg by mouth 2 (two) times daily.    Marland Kitchen eletriptan (RELPAX) 40 MG tablet Take 40 mg by mouth as needed for migraine or headache. May repeat in 2 hours if headache persists or recurs.    Marland Kitchen esomeprazole (NEXIUM) 40 MG capsule Take 40 mg by mouth daily before breakfast. Name Brand Only    . fentaNYL (DURAGESIC) 50 MCG/HR Place 50 mcg onto the skin every 3 (three) days.    . furosemide (LASIX) 20 MG tablet Take 20 mg by mouth daily as needed for fluid.     Marland Kitchen gabapentin (NEURONTIN) 600 MG tablet Take 0.5 tablets (300 mg total) by mouth 3 (three) times daily.    . hydrOXYzine (VISTARIL) 25 MG capsule Take 25 mg by mouth 3 (three) times daily as needed for anxiety.    . insulin aspart (NOVOLOG) 100 UNIT/ML FlexPen Inject 75 Units into the skin See admin instructions. Injects 75 units under the skin before breakfast; injects 25 units at lunch; injects 75 units at dinner time    . levETIRAcetam (KEPPRA) 750 MG tablet Take 750 mg by mouth 2 (two) times daily.    Marland Kitchen levothyroxine (SYNTHROID) 150 MCG tablet Take 150 mcg by mouth daily.    Marland Kitchen lidocaine (LIDODERM) 5 % Place 4 patches onto the skin daily.  0  . lisinopril (ZESTRIL) 20 MG tablet Take 20 mg by mouth daily.    Marland Kitchen NARCAN 4 MG/0.1ML LIQD nasal spray kit Place 4 mg into the nose daily as needed (opioid overdose).  0  . nystatin (MYCOSTATIN/NYSTOP) 100000 UNIT/GM POWD Apply 1 g  topically daily.    . Oxycodone HCl 10 MG TABS Take 10 mg by mouth 4 (four) times daily.    Marland Kitchen PE-Triprolidine-DM-GG-APAP (MUCINEX CLEAR & COOL DAY/NIGHT PO) Take 1 tablet by mouth every 6 (six) hours as needed (cold symptoms).    . Phenyleph-Doxylamine-DM-APAP (NYQUIL SEVERE COLD/FLU) 5-6.25-10-325 MG/15ML LIQD Take 30 mLs by mouth every 8 (eight) hours as needed (cold symptoms).    . promethazine (PHENERGAN) 25 MG tablet Take 25 mg by mouth every 6 (six) hours as needed for nausea or vomiting.    . senna (  SENOKOT) 8.6 MG TABS tablet Take 8.6 mg by mouth daily as needed for mild constipation.     . sitaGLIPtin (JANUVIA) 50 MG tablet Take 50 mg by mouth daily.    Marland Kitchen topiramate (TOPAMAX) 200 MG tablet Take 200 mg by mouth 2 (two) times daily.    . TRULICITY 1.5 OI/7.5ZV SOPN Inject 1.5 mg into the skin every Thursday.       ROS:                                                                                                                                       States she has been shaking. Unable to obtain detailed ROS due to her delirium.    Blood pressure (!) 94/54, pulse (!) 103, temperature 98.7 F (37.1 C), temperature source Oral, resp. rate 17, height '5\' 5"'  (1.651 m), weight (!) 163.6 kg, SpO2 92 %.   General Examination:                                                                                                       Physical Exam  HEENT-  [place]/AT. Oral mucosa is poorly hydrated and voice is hoarse.  Lungs- Labored and tachypneic respirations Extremities- Prominent adiposity  Neurological Examination Mental Status: Waxing and waning level of consciousness. Impaired attention. Poorly oriented. Perseverates frequently. Verbal output is dysarthric and stuttering. Speech is fluent at times and unintelligible at others. Follows simple commands when more awake. At one point became more delirious with eyes closed, not answering questions but was repeating the same phrase over and over  again.  Cranial Nerves: II: Did not blink to threat. PERRL.  III,IV, VI: Clenches eyes shut at times, at others squints. EOMI without nystagmus.  V,VII: Reacts to cool temp bilaterally. Face symmetric.  VIII: hearing intact to voice.  IX,X: Hoarseness noted.  XI: Head is midline XII: Midline tongue extension Motor: Moves bilateral upper and lower extremities antigravity but compromised by pain x 4. Resists examiner briefly with 4/5 strength x 4 without asymmetry but quickly loses attention.   Sensory: Subjectively intact to FT and temp BUE and BLE Deep Tendon Reflexes: Difficult to elicit due to prominent adiposity Plantars: Mute bilaterally  Cerebellar: No cerebellar ataxia, but there is prominent twitching in all 4 limbs with both negative and positive myoclonus noted.  Gait: Unable to assess   Lab Results: Basic Metabolic Panel: Recent Labs  Lab 03/26/20 0130 03/26/20 0824  NA 133* 130*  K 4.5 4.9  CL 100  --   CO2 18*  --   GLUCOSE 336*  --   BUN 39*  --   CREATININE 4.89*  --   CALCIUM 8.6*  --     CBC: Recent Labs  Lab 03/26/20 0130 03/26/20 0824  WBC 14.8*  --   NEUTROABS 9.2*  --   HGB 13.8 12.9  HCT 44.0 38.0  MCV 97.3  --   PLT 279  --     Cardiac Enzymes: No results for input(s): CKTOTAL, CKMB, CKMBINDEX, TROPONINI in the last 168 hours.  Lipid Panel: No results for input(s): CHOL, TRIG, HDL, CHOLHDL, VLDL, LDLCALC in the last 168 hours.  Imaging: DG Chest Port 1 View  Result Date: 03/26/2020 CLINICAL DATA:  Sepsis EXAM: PORTABLE CHEST 1 VIEW COMPARISON:  None. FINDINGS: The heart size and mediastinal contours are within normal limits. Overall shallow degree of aeration. Both lungs are clear. The visualized skeletal structures are unremarkable. IMPRESSION: No active disease.  Shallow degree of aeration. Electronically Signed   By: Prudencio Pair M.D.   On: 03/26/2020 01:50   CT RENAL STONE STUDY  Result Date: 03/26/2020 CLINICAL DATA:  Psych pain,  fever, dyspnea and confusion. EXAM: CT ABDOMEN AND PELVIS WITHOUT CONTRAST TECHNIQUE: Multidetector CT imaging of the abdomen and pelvis was performed following the standard protocol without IV contrast. COMPARISON:  10/28/2017 CT abdomen/pelvis. FINDINGS: Lower chest: No significant pulmonary nodules or acute consolidative airspace disease. Mildly enlarged 1.2 cm right subcarinal node (series 3/image 10), stable since 2014 CT, most compatible with benign reactive etiology. Hepatobiliary: Normal liver size. No liver mass. Cholecystectomy. No biliary ductal dilatation. Pancreas: Normal, with no mass or duct dilation. Spleen: Normal size. No mass. Adrenals/Urinary Tract: Normal adrenals. No hydronephrosis. No renal stones. No contour deforming renal masses. Normal caliber ureters. No ureteral stones. Normal nondistended bladder. Stomach/Bowel: Normal non-distended stomach. Normal caliber small bowel with no small bowel wall thickening. Normal appendix. Normal large bowel with no diverticulosis, large bowel wall thickening or pericolonic fat stranding. Vascular/Lymphatic: Normal caliber abdominal aorta. Stable mild porta hepatis adenopathy up to 1.1 cm (series 3/image 34). No new pathologically enlarged lymph nodes in the abdomen or pelvis. Reproductive: Grossly normal uterus. Intrauterine device is grossly well-positioned in the uterine cavity. No adnexal mass. Other: No pneumoperitoneum, ascites or focal fluid collection. Musculoskeletal: No aggressive appearing focal osseous lesions. Marked lumbar spondylosis. Stable mixed calcific and fat density 7.8 cm focus in subcutaneous medial left gluteal region, most compatible with fat necrosis. IMPRESSION: 1. No acute abnormality. No nephrolithiasis. No hydronephrosis. No evidence of bowel obstruction or acute bowel inflammation. Normal appendix. 2. Chronic stable mild porta hepatis adenopathy, probably reactive. Electronically Signed   By: Ilona Sorrel M.D.   On:  03/26/2020 04:07    Assessment: 50 year old female with a history of seizures, on Keppra, presenting with acute encephalopathy and tremor-like movements in the setting of AKI and polypharmacy.  1. Exam reveals florid generally nonsynchronous twitching movements of her upper and lower extremities that are nearly constant throughout the exam. The movements wax and wane in intensity and frequency, which correlates with patient's level of arousal. She also has waxing and waning level of consciousness, perseveration and confusion, consistent with a delirium.  2. Most likely etiology for her delirium: combined metabolic encephalopathy (uremia in the setting of AKI, with possible hypoxemic component) and toxic encephalopathy (polypharmacy effects exacerbated by subacutely decreased renal clearance). Neurontin can result in diffuse tremor and twitching movements if levels are  toxic. 3. Seizure less likely, but will need to obtain EEG.  4. Serotonin syndrome is also on the DDx as serotonergic medication serum levels may have been acutely increased due to her AKI.  5. Leukocytosis. Infection also may be contributing to her AMS. No neck stiffness on exam.   Recommendations: 1. EEG has been ordered and is pending.  2. CT head.  3. Hold Neurontin.  4. Hold aripiprazole and cymbalta. Would not restart home Elavil, baclofen, Soma, Zyrtec, hydroxyzine, oxycodone, Mucinex (the type she is taking contains dextromethorphan), Nyquil (the type she is taking contains dextromethorphan and doxylamine), phenergan and Topamax.  5. Restart her Keppra at home dose of 750 mg BID. Switch to IV (ordered). Do not hold this med as withdrawal seizures may occur.  6. Agree with consolidating her opiate pain medication regimen with fentanyl patch.   Electronically signed: Dr. Kerney Elbe 03/26/2020, 10:40 AM

## 2020-03-26 NOTE — Progress Notes (Signed)
eLink Physician-Brief Progress Note Patient Name: [name] DOB: Feb 27, 1971 MRN: [id number]   Date of Service  03/26/2020  HPI/Events of Note  50 year old woman with confusion and AKI. Seen by PCCM earlier and now being evaluated by bedside PCCM  eICU Interventions  Stable vitals on camera, discussing case with bedside MD     Intervention Category Major Interventions: Electrolyte abnormality - evaluation and management Evaluation Type: New Patient Evaluation  Oretha Milch 03/26/2020, 7:38 PM

## 2020-03-26 NOTE — Progress Notes (Signed)
Pharmacy Antibiotic Note  [name] is a 50 y.o. female admitted on 03/26/2020 with sepsis.  Pharmacy has been consulted for Vancomycin/Cefepime dosing. Recent discharge. WBC elevated. Acute renal failure (Scr 0.94>>>4.89 over 2 week period).   Plan: Vancomycin 2000 mg IV x 1, further dosing based on renal function trend Cefepime 2g IV q24h Trend WBC, temp, renal function  F/U infectious work-up Drug levels as indicated  Height: 5\' 5"  (165.1 cm) Weight: (!) 163.6 kg (360 lb 10.8 oz) IBW/kg (Calculated) : 57  Temp (24hrs), Avg:100.3 F (37.9 C), Min:100.3 F (37.9 C), Max:100.3 F (37.9 C)  Recent Labs  Lab 03/26/20 0130  WBC 14.8*  CREATININE 4.89*  LATICACIDVEN 2.5*    Estimated Creatinine Clearance: 21.4 mL/min (A) (by C-G formula based on SCr of 4.89 mg/dL (H)).    Allergies  Allergen Reactions  . Penicillins Hives and Other (See Comments)    HAS PT DEVELOPED SEVERE RASH INVOLVING MUCUS MEMBRANES or SKIN NECROSIS: #  #  YES  #  # PATIENT HAS HAD A PCN REACTION THAT REQUIRED HOSPITALIZATION:  #  #  YES  #  #   Tolerates amoxicillin on multiple occasions per Dr. 05/24/20 note 11/01/17.  TDD.  . Clindamycin/Lincomycin Hives, Dermatitis and Rash  . 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 Sulfa Antibiotics Hives  . Versed [Midazolam] Nausea And Vomiting    Pt had medication on October 16 2017 with no issues   07-24-1979, PharmD, BCPS Clinical Pharmacist Phone: [phone]

## 2020-03-26 NOTE — Progress Notes (Signed)
Pt being followed for sepsis protocol 

## 2020-03-27 ENCOUNTER — Inpatient Hospital Stay (HOSPITAL_COMMUNITY): Payer: Medicare HMO

## 2020-03-27 ENCOUNTER — Encounter (HOSPITAL_COMMUNITY): Payer: Self-pay | Admitting: Pulmonary Disease

## 2020-03-27 DIAGNOSIS — N171 Acute kidney failure with acute cortical necrosis: Secondary | ICD-10-CM | POA: Diagnosis not present

## 2020-03-27 DIAGNOSIS — E039 Hypothyroidism, unspecified: Secondary | ICD-10-CM | POA: Diagnosis not present

## 2020-03-27 DIAGNOSIS — G4730 Sleep apnea, unspecified: Secondary | ICD-10-CM

## 2020-03-27 DIAGNOSIS — G253 Myoclonus: Secondary | ICD-10-CM

## 2020-03-27 DIAGNOSIS — E1100 Type 2 diabetes mellitus with hyperosmolarity without nonketotic hyperglycemic-hyperosmolar coma (NKHHC): Secondary | ICD-10-CM

## 2020-03-27 DIAGNOSIS — E1165 Type 2 diabetes mellitus with hyperglycemia: Secondary | ICD-10-CM

## 2020-03-27 DIAGNOSIS — G9341 Metabolic encephalopathy: Secondary | ICD-10-CM

## 2020-03-27 LAB — GLUCOSE, CAPILLARY
Glucose-Capillary: 115 mg/dL — ABNORMAL HIGH (ref 70–99)
Glucose-Capillary: 119 mg/dL — ABNORMAL HIGH (ref 70–99)
Glucose-Capillary: 119 mg/dL — ABNORMAL HIGH (ref 70–99)
Glucose-Capillary: 121 mg/dL — ABNORMAL HIGH (ref 70–99)
Glucose-Capillary: 121 mg/dL — ABNORMAL HIGH (ref 70–99)
Glucose-Capillary: 123 mg/dL — ABNORMAL HIGH (ref 70–99)
Glucose-Capillary: 127 mg/dL — ABNORMAL HIGH (ref 70–99)
Glucose-Capillary: 128 mg/dL — ABNORMAL HIGH (ref 70–99)
Glucose-Capillary: 129 mg/dL — ABNORMAL HIGH (ref 70–99)
Glucose-Capillary: 130 mg/dL — ABNORMAL HIGH (ref 70–99)
Glucose-Capillary: 133 mg/dL — ABNORMAL HIGH (ref 70–99)
Glucose-Capillary: 136 mg/dL — ABNORMAL HIGH (ref 70–99)
Glucose-Capillary: 137 mg/dL — ABNORMAL HIGH (ref 70–99)
Glucose-Capillary: 150 mg/dL — ABNORMAL HIGH (ref 70–99)
Glucose-Capillary: 153 mg/dL — ABNORMAL HIGH (ref 70–99)
Glucose-Capillary: 155 mg/dL — ABNORMAL HIGH (ref 70–99)
Glucose-Capillary: 156 mg/dL — ABNORMAL HIGH (ref 70–99)
Glucose-Capillary: 158 mg/dL — ABNORMAL HIGH (ref 70–99)
Glucose-Capillary: 161 mg/dL — ABNORMAL HIGH (ref 70–99)

## 2020-03-27 LAB — POCT I-STAT 7, (LYTES, BLD GAS, ICA,H+H)
Acid-base deficit: 7 mmol/L — ABNORMAL HIGH (ref 0.0–2.0)
Acid-base deficit: 6 mmol/L — ABNORMAL HIGH (ref 0.0–2.0)
Bicarbonate: 20.7 mmol/L (ref 20.0–28.0)
Bicarbonate: 21.3 mmol/L (ref 20.0–28.0)
Calcium, Ion: 1.15 mmol/L (ref 1.15–1.40)
Calcium, Ion: 1.14 mmol/L — ABNORMAL LOW (ref 1.15–1.40)
HCT: 38 % (ref 36.0–46.0)
HCT: 39 % (ref 36.0–46.0)
Hemoglobin: 12.9 g/dL (ref 12.0–15.0)
Hemoglobin: 13.3 g/dL (ref 12.0–15.0)
O2 Saturation: 97 %
O2 Saturation: 98 %
Patient temperature: 98.6
Patient temperature: 98.6
Potassium: 4.1 mmol/L (ref 3.5–5.1)
Potassium: 4 mmol/L (ref 3.5–5.1)
Sodium: 136 mmol/L (ref 135–145)
Sodium: 136 mmol/L (ref 135–145)
TCO2: 22 mmol/L (ref 22–32)
TCO2: 23 mmol/L (ref 22–32)
pCO2 arterial: 49.8 mmHg — ABNORMAL HIGH (ref 32.0–48.0)
pCO2 arterial: 50.2 mmHg — ABNORMAL HIGH (ref 32.0–48.0)
pH, Arterial: 7.226 — ABNORMAL LOW (ref 7.350–7.450)
pH, Arterial: 7.235 — ABNORMAL LOW (ref 7.350–7.450)
pO2, Arterial: 107 mmHg (ref 83.0–108.0)
pO2, Arterial: 115 mmHg — ABNORMAL HIGH (ref 83.0–108.0)

## 2020-03-27 LAB — RENAL FUNCTION PANEL
Albumin: 3.2 g/dL — ABNORMAL LOW (ref 3.5–5.0)
Albumin: 2.9 g/dL — ABNORMAL LOW (ref 3.5–5.0)
Anion gap: 13 (ref 5–15)
Anion gap: 13 (ref 5–15)
BUN: 47 mg/dL — ABNORMAL HIGH (ref 6–20)
BUN: 39 mg/dL — ABNORMAL HIGH (ref 6–20)
CO2: 18 mmol/L — ABNORMAL LOW (ref 22–32)
CO2: 19 mmol/L — ABNORMAL LOW (ref 22–32)
Calcium: 8.1 mg/dL — ABNORMAL LOW (ref 8.9–10.3)
Calcium: 7.6 mg/dL — ABNORMAL LOW (ref 8.9–10.3)
Chloride: 103 mmol/L (ref 98–111)
Chloride: 101 mmol/L (ref 98–111)
Creatinine, Ser: 4.32 mg/dL — ABNORMAL HIGH (ref 0.44–1.00)
Creatinine, Ser: 2.42 mg/dL — ABNORMAL HIGH (ref 0.44–1.00)
GFR, Estimated: 12 mL/min — ABNORMAL LOW (ref >60)
GFR, Estimated: 24 mL/min — ABNORMAL LOW (ref >60)
Glucose, Bld: 133 mg/dL — ABNORMAL HIGH (ref 70–99)
Glucose, Bld: 147 mg/dL — ABNORMAL HIGH (ref 70–99)
Phosphorus: 5.8 mg/dL — ABNORMAL HIGH (ref 2.5–4.6)
Phosphorus: 4.2 mg/dL (ref 2.5–4.6)
Potassium: 4.2 mmol/L (ref 3.5–5.1)
Potassium: 4.9 mmol/L (ref 3.5–5.1)
Sodium: 134 mmol/L — ABNORMAL LOW (ref 135–145)
Sodium: 133 mmol/L — ABNORMAL LOW (ref 135–145)

## 2020-03-27 LAB — PROCALCITONIN: Procalcitonin: 0.86 ng/mL

## 2020-03-27 LAB — MAGNESIUM: Magnesium: 1.4 mg/dL — ABNORMAL LOW (ref 1.7–2.4)

## 2020-03-27 LAB — APTT: aPTT: 29 seconds (ref 24–36)

## 2020-03-27 LAB — CBC
HCT: 38.4 % (ref 36.0–46.0)
Hemoglobin: 12.7 g/dL (ref 12.0–15.0)
MCH: 31.4 pg (ref 26.0–34.0)
MCHC: 33.1 g/dL (ref 30.0–36.0)
MCV: 95 fL (ref 80.0–100.0)
Platelets: 211 10*3/uL (ref 150–400)
RBC: 4.04 MIL/uL (ref 3.87–5.11)
RDW: 14.5 % (ref 11.5–15.5)
WBC: 11.5 10*3/uL — ABNORMAL HIGH (ref 4.0–10.5)
nRBC: 0 % (ref 0.0–0.2)

## 2020-03-27 LAB — URINE CULTURE: Culture: NO GROWTH

## 2020-03-27 IMAGING — DX DG CHEST 1V PORT
1 series · 1 of 1 positions shown · non-contrast
Comparison: [DATE]

CLINICAL DATA: Central venous catheter placement

EXAM:
PORTABLE CHEST 1 VIEW

[chest]
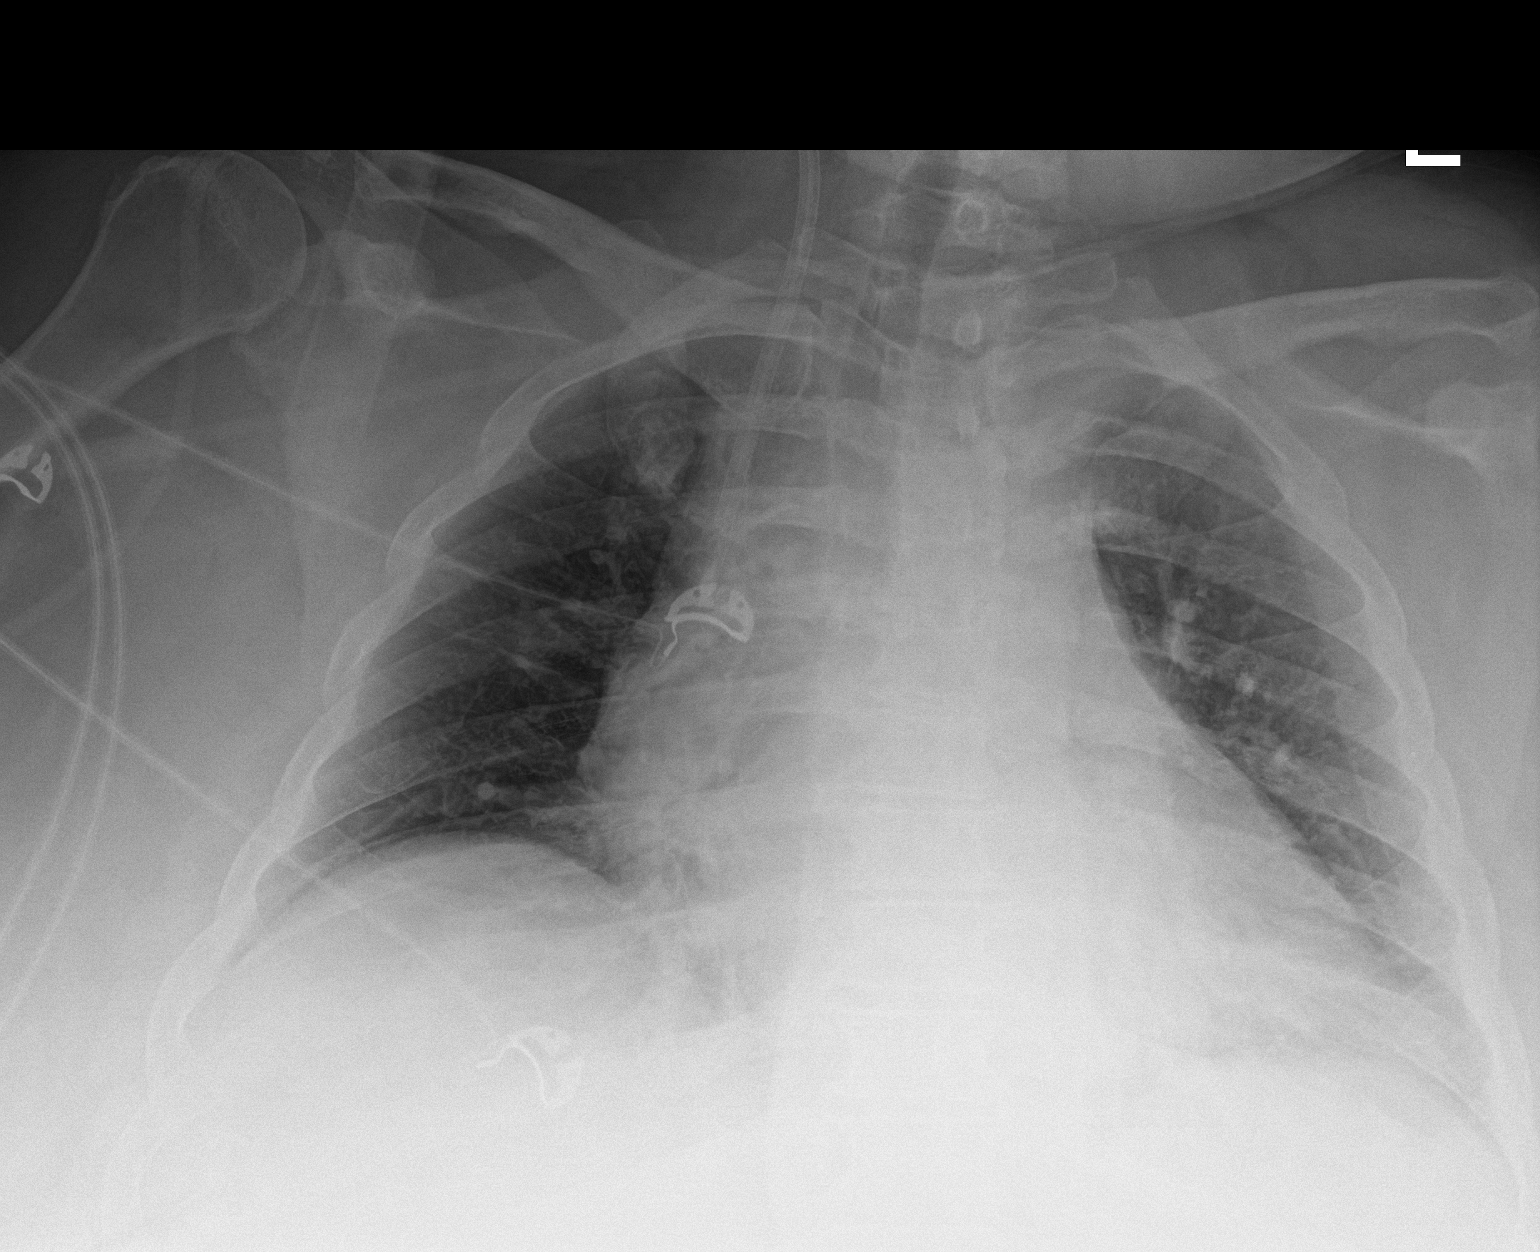

[1 of 1 positions shown; findings below may reference images not displayed]

FINDINGS: Right internal jugular hemodialysis catheter is seen with its tip
overlying the superior vena cava. Lung volumes are small but are
symmetric and are clear. No pneumothorax or pleural effusion. Mild
cardiomegaly is stable. No acute bone abnormality.
IMPRESSION: Right internal jugular hemodialysis catheter tip within the superior
vena cava. No pneumothorax.

Stable pulmonary hypoinflation.

## 2020-03-27 MED ORDER — CHLORHEXIDINE GLUCONATE 0.12 % MT SOLN
15.0000 mL | Freq: Two times a day (BID) | OROMUCOSAL | Status: DC
  Administered 2020-03-27 – 2020-04-03 (×15): 15 mL via OROMUCOSAL
  Filled 2020-03-27 (×10): qty 15

## 2020-03-27 MED ORDER — HEPARIN SODIUM (PORCINE) 1000 UNIT/ML DIALYSIS
1000.0000 [IU] | INTRAMUSCULAR | Status: DC | PRN
  Administered 2020-03-27 (×2): 2600 [IU] via INTRAVENOUS_CENTRAL
  Filled 2020-03-27 (×3): qty 6

## 2020-03-27 MED ORDER — ALTEPLASE 2 MG IJ SOLR
2.0000 mg | Freq: Once | INTRAMUSCULAR | Status: AC
  Administered 2020-03-27: 2 mg
  Filled 2020-03-27: qty 2

## 2020-03-27 MED ORDER — MAGNESIUM SULFATE 2 GM/50ML IV SOLN
2.0000 g | Freq: Once | INTRAVENOUS | Status: AC
  Administered 2020-03-27: 2 g via INTRAVENOUS
  Filled 2020-03-27: qty 50

## 2020-03-27 MED ORDER — ALTEPLASE 2 MG IJ SOLR
2.0000 mg | Freq: Once | INTRAMUSCULAR | Status: DC
  Filled 2020-03-27: qty 2

## 2020-03-27 MED ORDER — HEPARIN (PORCINE) 2000 UNITS/L FOR CRRT
INTRAVENOUS_CENTRAL | Status: DC | PRN
  Filled 2020-03-27 (×2): qty 1000

## 2020-03-27 MED ORDER — OXYCODONE HCL 5 MG PO TABS
10.0000 mg | ORAL_TABLET | Freq: Three times a day (TID) | ORAL | Status: DC | PRN
  Administered 2020-03-27: 10 mg via ORAL
  Filled 2020-03-27: qty 2

## 2020-03-27 MED ORDER — ORAL CARE MOUTH RINSE
15.0000 mL | Freq: Two times a day (BID) | OROMUCOSAL | Status: DC
  Administered 2020-03-27 – 2020-04-03 (×10): 15 mL via OROMUCOSAL

## 2020-03-27 MED ORDER — MIDAZOLAM HCL 2 MG/2ML IJ SOLN
INTRAMUSCULAR | Status: AC
  Administered 2020-03-27: 2 mg via INTRAVENOUS
  Filled 2020-03-27: qty 2

## 2020-03-27 MED ORDER — MIDAZOLAM HCL 2 MG/2ML IJ SOLN: 2.0000 mg | Freq: Once | INTRAMUSCULAR | Status: AC

## 2020-03-27 MED ORDER — SODIUM CHLORIDE 0.9 % IV SOLN: INTRAVENOUS | Status: DC | PRN

## 2020-03-27 NOTE — Progress Notes (Signed)
[facility] Progress Note   Assessment/ Plan:   Assessment/ Plan: 1. AKI - prob due to vol depletion, hx of the same on many prior admits. Prob due to uncont DM2 and the osmotic fluid losses associated. Creat baseline 0.9. Creat here 4.89 prob due to vol loss/ hemodynamics +/- ATN. UA unremarkable, CT w/o obstruction. Pt has really bad myoclonic jerking/ asterixis w/ advanced AKI , eGFR 10 ml/min .  Uremia +/- medication (gapapentin + baclofen combo).  Continue CRRT for now- anticipate she will need 24 more hours to really clear.  Continue IVFs.  Heparinized saline in primer. If clots again will need hep syringe.  2. IDDM 3. Depression / chronic pain - on multiple meds, seen by neurology 4. Seizures - on keppra , to continue per neuro 5. Vol depletion - exam difficult, but this is usually what she comes in with. CXR clear. IVF"s.    Subjective:    Seen in room.  Twitching still present but pt reports it's better.  She is able to tell me that she was taking 1-3 pills of baclofen daily too.     Objective:   BP 129/78   Pulse 96   Temp 98.8 F (37.1 C) (Axillary)   Resp 14   Ht $R'5\' 5"'Ng$  (1.651 m)   Wt (!) 163.6 kg   SpO2 98%   BMI 60.02 kg/m   Physical Exam: Gen: obese, NAD CVS: RRR Resp: clear Abd: soft Ext: no LE edema ACCESS: R IJ nontunneled HD cath MSK: pronounced myoclonic jerking  Labs: BMET Recent Labs  Lab 03/26/20 0130 03/26/20 0824 03/26/20 2005 03/27/20 0126 03/27/20 0149 03/27/20 0408  NA 133* 130* 134* 134* 136 136  K 4.5 4.9 5.3* 4.2 4.1 4.0  CL 100  --  104 103  --   --   CO2 18*  --  15* 18*  --   --   GLUCOSE 336*  --  193* 133*  --   --   BUN 39*  --  44* 47*  --   --   CREATININE 4.89*  --  4.71* 4.32*  --   --   CALCIUM 8.6*  --  8.0* 8.1*  --   --   PHOS  --   --   --  5.8*  --   --    CBC Recent Labs  Lab 03/26/20 0130 03/26/20 0824 03/27/20 0126 03/27/20 0149 03/27/20 0408  WBC 14.8*  --  11.5*  --   --   NEUTROABS  9.2*  --   --   --   --   HGB 13.8 12.9 12.7 12.9 13.3  HCT 44.0 38.0 38.4 38.0 39.0  MCV 97.3  --  95.0  --   --   PLT 279  --  211  --   --       Medications:    . alteplase  2 mg Intracatheter Once  . atorvastatin  20 mg Oral Daily  . chlorhexidine  15 mL Mouth Rinse BID  . Chlorhexidine Gluconate Cloth  6 each Topical Q0600  . fentaNYL  1 patch Transdermal Q72H  . heparin  5,000 Units Subcutaneous Q8H  . levothyroxine  150 mcg Oral Daily  . lidocaine  4 patch Transdermal Q24H  . mouth rinse  15 mL Mouth Rinse q12n4p  . pantoprazole  40 mg Oral Daily  . sodium chloride flush  3 mL Intravenous Q12H     Madelon Lips MD 03/27/2020, 11:06  AM  

## 2020-03-27 NOTE — Progress Notes (Signed)
Inpatient Diabetes Program Recommendations  AACE/ADA: New Consensus Statement on Inpatient Glycemic Control (2015)  Target Ranges:  Prepandial:   less than 140 mg/dL      Peak postprandial:   less than 180 mg/dL (1-2 hours)      Critically ill patients:  140 - 180 mg/dL   Lab Results  Component Value Date   GLUCAP 121 (H) 03/27/2020   HGBA1C 11.9 (H) 03/08/2020    Review of Glycemic Control  Diabetes history: DM2 Outpatient Diabetes medications: 70/30 75 units bid + Januvia 50  Current orders for Inpatient glycemic control: IV insulin  Inpatient Diabetes Program Recommendations:   Noted patient sees endocrinologist Dr. Talmage Nap. Patient was just discharged from [facility] facility on 03/12/20. While in the hospital, patient was on Lantus 95 units + Novolog 35 units tid meal coverage when eating  When ready to transition off of IV insulin and start Lantus: Give basal insulin 2 hrs. Prior to IV insulin discontinued and cover CBG with correction scale when IV insulin stopped.  50% home insulin basal = approximately 52 units daily -add Novolog meal coverage when eating -Novolog correction resistant q 4 hrs. While NPO.  Thank you, Billy Fischer. Aden Youngman, RN, MSN, CDE  Diabetes Coordinator Inpatient Glycemic Control Team Team Pager [phone] (8am-5pm) 03/27/2020 9:11 AM

## 2020-03-27 NOTE — Procedures (Signed)
Arterial Catheter Insertion Procedure Note  [name]  [id number]  Jan 23, 1971  Date:03/27/20  Time:1:49 AM    Provider Performing: Leafy Half    Procedure: Insertion of Arterial Line (44967) without US guidance  Indication(s) Blood pressure monitoring and/or need for frequent ABGs  Consent Unable to obtain consent due to emergent nature of procedure.  Anesthesia None   Time Out Verified patient identification, verified procedure, site/side was marked, verified correct patient position, special equipment/implants available, medications/allergies/relevant history reviewed, required imaging and test results available.   Sterile Technique Maximal sterile technique including full sterile barrier drape, hand hygiene, sterile gown, sterile gloves, mask, hair covering, sterile ultrasound probe cover (if used).   Procedure Description Area of catheter insertion was cleaned with chlorhexidine and draped in sterile fashion. With real-time ultrasound guidance an arterial catheter was placed into the left radial artery.  Appropriate arterial tracings confirmed on monitor.     Complications/Tolerance None; patient tolerated the procedure well.   EBL Minimal   Specimen(s) None

## 2020-03-27 NOTE — Progress Notes (Addendum)
1930: Pt arrived to unit from ED. Upon skin assessment, pt found to have 4 fentanyl patches on her back. 3 of the patches were dated 12/30 with one dated 03/27/19. The 3 outdated patches removed and Md made aware.   0010: Pt placed on Bipap  0126: HD cath placed to RIJ. Pt given 2mg  Versed prior to procedure for anxiety/agitation. VSS, Line confirmed with xray  0147: A-line placed to L radial by RT.   0208: CRRT started  0426: CRRT filter clotting, blood returned  0450: CRRT restarted. Pt tolerating well. Will continue to monitor

## 2020-03-27 NOTE — Consult Note (Signed)
NAME:  [name], MRN:  [id number], DOB:  10-23-1970, LOS: 1 ADMISSION DATE:  03/26/2020, CONSULTATION DATE:  03/26/20 REFERRING MD:  Lorin Mercy - [facility] , CHIEF COMPLAINT:  AMS  Brief History:  50 yo on several AEDs at home brought to ED for AMS. Has AKI. Worse AMS in ED -- PCCM consulted   History of Present Illness:  50 yo hx morbid obesity, history of osa / ohs, seizure disorder, chronic pain syndromes, migraine who presented to ED 1/2 with AMS, fever. Of note pt recently admitted 12/14 with increasing tremor and AKI at which time no sz activity was noted and OP neuro follow up recommended. On presentation 1/2, pt significant other reports AMS and notes recent UTI. On arrival to ED, pt complaining of back pain. Started on empiric abx Admitted to [facility].  Labs reveal AKI with Cr. 4.89.  K 4.5, Glu 336, LA 2.5 WBC 14.8  ABG 7.27/40/65 UA not consistent with infection    In ED, worsening AMS and change is respiratory pattern -- PCCM consulted in this setting  Past Medical History:  AKI Anxiety Depression DM2 DJD Fibromyalgia GERD MRSA infection HTN Migraine PVD Foot drop OSA, OHS  Umbilical hernia Morbid obesity  Chronic pain  Hypothyroidism Seizure disorder   Significant Hospital Events:  1/2 presented to ED for AMS. CCM consult for worsening AMS, changing resp pattern. Admit to [facility]  Consults:  PCCM nephro Neuro   Procedures:    Significant Diagnostic Tests:    Micro Data:  1/2 COVID neg 1/2 BCx> 1/2 UCx>   Antimicrobials:  1/2 vanc 1/2 cefepime 1/2 flagyl  Interim History / Subjective:  Overnight started on CRRT. Paused due to clotted catheter this morning. On my arrival patient alert and oriented to self and situation but not to time. Asking for food. Says she remembers being confused at home and felt like she was going into renal failure like she has done in the past. Still on insulin gtt  Objective   Blood pressure (!) 88/73, pulse 97, temperature  98.1 F (36.7 C), temperature source Oral, resp. rate 13, height _0  (1.651 m), weight (!) 163.6 kg, SpO2 96 %.    FiO2 (%):  [40 %] 40 %   Intake/Output Summary (Last 24 hours) at 03/27/2020 1851 Last data filed at 03/27/2020 1800 Gross per 24 hour  Intake 4742 ml  Output 1044 ml  Net 3698 ml   Filed Weights   03/26/20 0224  Weight: (!) 163.6 kg    Examination: General: Morbidly obese middle aged F, reclined in stretcher NAD HENT: NCAT, short neck with redundant tissue. Anicteric sclera Lungs: Distant breath sounds. Symmetrical chest expansion, even, unlabored shallow respirations.  Cardiovascular: rrr distant heart sounds cap refill brisk  Abdomen: Obese, soft, ndnt  Extremities: No obvious joint deformity no cyanosis or clubbing  Neuro: Awake, alert.  myoclonic twitching of L foot, LUE, R hand GU: foley Psych:  Calm, cooperative. Intermittent confused  Resolved Hospital Problem list     Assessment & Plan:   Acute encephalopathy, suspect toxic/metabolic - suspect gabapentin toxicity in setting of renal failure, also with hyperglycemic state -ABG without hypercarbia  Myoclonus  Hx seizure disorder P -appreciate neuro consult, nephro consult  -limit CNS depressing medications  -holding home medications, will slowly fold back some pain medications per patient request. Fentanyl patch only, got somnolent with oxycodone.   Morbid Obesity Acute respiratory insufficiency with hypoxia Hx Sleep Apnea  -see in hx sleep apnea, pt denies,  is not on CPAP at home. -wonder if abnormal breathing in ED when pt somnolent was this  P -might benefit from noct BiPAP -Supplemental O2 for SpO2 > 92% -elevate HOB, IS  - needs OSA eval as outpatient  AKI -CT renal stone study without stones, hydronephrosis P -nephro consult. Tolerated a few hours of CRRT before catheter had TPA x2 without improvement of flows. Discussed with Dr. Johnney Ou, will hold for now since her mental status is  Not  worsening at this time.  -trend renal indices, I/O - maintain foley catheter  -hold statin    Chronic pain Fibromyalgia Depression -hold home meds   Hypothyroidism -synthroid   DM2 with HHS - continue insulin gtt until taking po more consistently.   Best practice (evaluated daily)  Diet: NPO Pain/Anxiety/Delirium protocol (if indicated): n/a VAP protocol (if indicated): n/a DVT prophylaxis: SQ hep GI prophylaxis: --  Glucose control: SSI Mobility: rec PT/OT  Disposition:SDU   Goals of Care:  Last date of multidisciplinary goals of care discussion:-- Family and staff present: -- Summary of discussion: -- Follow up goals of care discussion due: -- Code Status: Full   Lenice Llamas, MD Pulmonary and [place] Pager: [place]   CBC: Recent Labs  Lab 03/26/20 0130 03/26/20 0824 03/27/20 0126 03/27/20 0149 03/27/20 0408  WBC 14.8*  --  11.5*  --   --   NEUTROABS 9.2*  --   --   --   --   HGB 13.8 12.9 12.7 12.9 13.3  HCT 44.0 38.0 38.4 38.0 39.0  MCV 97.3  --  95.0  --   --   PLT 279  --  211  --   --     Basic Metabolic Panel: Recent Labs  Lab 03/26/20 0130 03/26/20 0824 03/26/20 2005 03/27/20 0126 03/27/20 0149 03/27/20 0408 03/27/20 1615  NA 133*   < > 134* 134* 136 136 133*  K 4.5   < > 5.3* 4.2 4.1 4.0 4.9  CL 100  --  104 103  --   --  101  CO2 18*  --  15* 18*  --   --  19*  GLUCOSE 336*  --  193* 133*  --   --  147*  BUN 39*  --  44* 47*  --   --  39*  CREATININE 4.89*  --  4.71* 4.32*  --   --  2.42*  CALCIUM 8.6*  --  8.0* 8.1*  --   --  7.6*  MG  --   --   --  1.4*  --   --   --   PHOS  --   --   --  5.8*  --   --  4.2   < > = values in this interval not displayed.   GFR: Estimated Creatinine Clearance: 43.2 mL/min (A) (by C-G formula based on SCr of 2.42 mg/dL (H)). Recent Labs  Lab 03/26/20 0130 03/26/20 0943 03/27/20 0126  PROCALCITON  --  0.95 0.86  WBC 14.8*   --  11.5*  LATICACIDVEN 2.5* 1.4  --     Liver Function Tests: Recent Labs  Lab 03/26/20 0130 03/27/20 0126 03/27/20 1615  AST 57*  --   --   ALT 41  --   --   ALKPHOS 109  --   --   BILITOT 0.6  --   --   PROT 6.8  --   --  ALBUMIN 3.6 3.2* 2.9*   No results for input(s): LIPASE, AMYLASE in the last 168 hours. No results for input(s): AMMONIA in the last 168 hours.  ABG    Component Value Date/Time   PHART 7.235 (L) 03/27/2020 0408   PCO2ART 50.2 (H) 03/27/2020 0408   PO2ART 115 (H) 03/27/2020 0408   HCO3 21.3 03/27/2020 0408   TCO2 23 03/27/2020 0408   ACIDBASEDEF 6.0 (H) 03/27/2020 0408   O2SAT 98.0 03/27/2020 0408     Coagulation Profile: Recent Labs  Lab 03/26/20 0130  INR 1.1    Cardiac Enzymes: No results for input(s): CKTOTAL, CKMB, CKMBINDEX, TROPONINI in the last 168 hours.  HbA1C: Hgb A1c MFr Bld  Date/Time Value Ref Range Status  03/08/2020 03:03 AM 11.9 (H) 4.8 - 5.6 % Final    Comment:    (NOTE) Pre diabetes:          5.7%-6.4%  Diabetes:              >6.4%  Glycemic control for   <7.0% adults with diabetes   03/25/2019 02:38 AM 10.9 (H) 4.8 - 5.6 % Final    Comment:    (NOTE) Pre diabetes:          5.7%-6.4% Diabetes:              >6.4% Glycemic control for   <7.0% adults with diabetes     CBG: Recent Labs  Lab 03/27/20 1103 03/27/20 1150 03/27/20 1419 03/27/20 1518 03/27/20 1655  GLUCAP 137* 115* 153* 119* 127*    Review of Systems:    Limited due to encephalopathy and perseverating thoughts  Endorses low back pain, fatigue, mild SOB   Past Medical History:  She,  has a past medical history of Acute renal failure (ARF) ([place]) (09/02/2012), Anemia, Anxiety, Back pain, Blood transfusion, Chronic heel ulcer ([place]), Chronic kidney disease, Chronic pain syndrome, Depression, Diabetes mellitus, DJD (degenerative joint disease), Dysrhythmia, Fibromyalgia, GERD (gastroesophageal reflux disease), H/O: Bell's palsy (2011), Heart  murmur, History of kidney stones, History of MRSA infection ([facility]), Hypertension, Hypothyroidism, Hypoventilation associated with obesity syndrome ([place]), Migraine, Non-healing non-surgical wound, Peripheral vascular disease ([place]), PONV (postoperative nausea and vomiting), Right foot drop, Shortness of breath, Sleep apnea, and Umbilical hernia.   Surgical History:   Past Surgical History:  Procedure Laterality Date  . BACK SURGERY     for lumbar disc disease X2  . [place]   Tumor removed- has steel plate  . BRAIN SURGERY      Plating due to soft spot closing too early- age 41  . BREAST LUMPECTOMY WITH NEEDLE LOCALIZATION Right 08/23/2013   Procedure: RIGHT BREAST LUMPECTOMY WITH NEEDLE LOCALIZATION;  Surgeon: Odis Hollingshead, MD;  Location: [place];  Service: General;  Laterality: Right;  . CHOLECYSTECTOMY  1984  . EXCISION UMBILICAL NODULE N/A 4/54/0981   Procedure: EXCISION OF UMBILICUS;  Surgeon: Coralie Keens, MD;  Location: [facility];  Service: General;  Laterality: N/A;  . EYE SURGERY     eye lift  . I & D EXTREMITY  04/02/2011   Procedure: IRRIGATION AND DEBRIDEMENT EXTREMITY;  Surgeon: Mcarthur Rossetti;  Location: [place];  Service: Orthopedics;  Laterality: Right;  I&D right heel ulcer, placement of A-cell graft  . INCISION AND DRAINAGE ABSCESS N/A 10/28/2017   Procedure: INCISION AND DRAINAGE UMBILICAL HERNIA ABSCESS;  Surgeon: Ralene Ok, MD;  Location: [place];  Service: General;  Laterality: N/A;  . INCISION AND DRAINAGE OF WOUND  08/29/2011   Procedure: IRRIGATION AND DEBRIDEMENT WOUND;  Surgeon: Theodoro Kos, DO;  Location: [place];  Service: Plastics;  Laterality: Right;  . INSERTION OF MESH N/A 10/16/2017   Procedure: INSERTION OF MESH;  Surgeon: Coralie Keens, MD;  Location: [facility];  Service: General;  Laterality: N/A;  . LITHOTRIPSY     2007ish  . right elbow    . UMBILICAL HERNIA REPAIR N/A 10/16/2017   Procedure: UMBILICAL HERNIA REPAIR WITH MESH;   Surgeon: Coralie Keens, MD;  Location: [facility];  Service: General;  Laterality: N/A;     Social History:   reports that she has never smoked. She has never used smokeless tobacco. She reports that she does not drink alcohol and does not use drugs.   Family History:  Her family history includes Anesthesia problems in her mother; Diabetes type II in her father and mother; Heart attack in her father and mother; Hypertension in her mother; Peripheral vascular disease in her father and mother.   Allergies Allergies  Allergen Reactions  . Penicillins Hives and Other (See Comments)    HAS PT DEVELOPED SEVERE RASH INVOLVING MUCUS MEMBRANES or SKIN NECROSIS: #  #  YES  #  # PATIENT HAS HAD A PCN REACTION THAT REQUIRED HOSPITALIZATION:  #  #  YES  #  #   Tolerates amoxicillin on multiple occasions per Dr. Darrel Hoover note 11/01/17.  TDD.  . Clindamycin/Lincomycin Hives, Dermatitis and Rash  . 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 Sulfa Antibiotics Hives  . Versed [Midazolam] Nausea And Vomiting    Pt had medication on October 16 2017 with no issues     Home Medications  Prior to Admission medications   Medication Sig Start Date End Date Taking? Authorizing Provider  acetaminophen (TYLENOL) 500 MG tablet Take 1,000 mg by mouth 2 (two) times daily as needed for moderate pain or headache.    [provider]  amitriptyline (ELAVIL) 25 MG tablet Take 50 mg by mouth at bedtime. 02/07/20   [provider]  ARIPiprazole (ABILIFY) 5 MG tablet Take 5 mg by mouth daily. 03/04/19   [provider]  atorvastatin (LIPITOR) 20 MG tablet Take 20 mg by mouth daily.    [provider]  baclofen (LIORESAL) 10 MG tablet Take 10 mg by mouth 3 (three) times daily. 01/13/20   [provider]  Biotin 10000 MCG TABS Take 10,000 mcg by mouth daily.    [provider]  carisoprodol (SOMA) 350 MG tablet Take 350 mg by mouth 2  (two) times daily.    [provider]  cetirizine (ZYRTEC) 10 MG tablet Take 10 mg by mouth daily as needed for allergies.    [provider]  Cyanocobalamin (VITAMIN B-12 IJ) Inject 1 each as directed every 30 (thirty) days.    [provider]  Diclofenac Sodium 2 % SOLN Place 2-4 g onto the skin 4 (four) times daily as needed. Patient taking differently: Place 4 g onto the skin 4 (four) times daily as needed (pain). 10/08/18   Mcarthur Rossetti, MD  DULoxetine (CYMBALTA) 60 MG capsule Take 60 mg by mouth 2 (two) times daily.    [provider]  eletriptan (RELPAX) 40 MG tablet Take 40 mg by mouth as needed for migraine or headache. May repeat in 2 hours if headache persists or recurs.    [provider]  esomeprazole (NEXIUM) 40 MG capsule Take  40 mg by mouth daily before breakfast. Name Brand Only    [provider]  fentaNYL (DURAGESIC) 50 MCG/HR Place 50 mcg onto the skin every 3 (three) days.    [provider]  furosemide (LASIX) 20 MG tablet Take 20 mg by mouth daily as needed for fluid.     [provider]  gabapentin (NEURONTIN) 600 MG tablet Take 0.5 tablets (300 mg total) by mouth 3 (three) times daily. 03/12/20   Domenic Polite, MD  hydrOXYzine (VISTARIL) 25 MG capsule Take 25 mg by mouth 3 (three) times daily as needed for anxiety.    [provider]  insulin aspart (NOVOLOG) 100 UNIT/ML FlexPen Inject 75 Units into the skin See admin instructions. Injects 75 units under the skin before breakfast; injects 25 units at lunch; injects 75 units at dinner time    [provider]  levETIRAcetam (KEPPRA) 750 MG tablet Take 750 mg by mouth 2 (two) times daily. 03/04/19   [provider]  levothyroxine (SYNTHROID) 150 MCG tablet Take 150 mcg by mouth daily.    [provider]  lidocaine (LIDODERM) 5 % Place 4 patches onto the skin daily. 05/30/17   [provider]  lisinopril  (ZESTRIL) 20 MG tablet Take 20 mg by mouth daily. 03/02/20   [provider]  NARCAN 4 MG/0.1ML LIQD nasal spray kit Place 4 mg into the nose daily as needed (opioid overdose). 10/22/17   [provider]  nystatin (MYCOSTATIN/NYSTOP) 100000 UNIT/GM POWD Apply 1 g topically daily.    [provider]  Oxycodone HCl 10 MG TABS Take 10 mg by mouth 4 (four) times daily.    [provider]  PE-Triprolidine-DM-GG-APAP [facility] CLEAR & COOL DAY/NIGHT PO) Take 1 tablet by mouth every 6 (six) hours as needed (cold symptoms).    [provider]  Phenyleph-Doxylamine-DM-APAP (NYQUIL SEVERE COLD/FLU) 5-6.25-10-325 MG/15ML LIQD Take 30 mLs by mouth every 8 (eight) hours as needed (cold symptoms).    [provider]  promethazine (PHENERGAN) 25 MG tablet Take 25 mg by mouth every 6 (six) hours as needed for nausea or vomiting.    [provider]  senna (SENOKOT) 8.6 MG TABS tablet Take 8.6 mg by mouth daily as needed for mild constipation.     [provider]  sitaGLIPtin (JANUVIA) 50 MG tablet Take 50 mg by mouth daily.    [provider]  topiramate (TOPAMAX) 200 MG tablet Take 200 mg by mouth 2 (two) times daily.    [provider]  TRULICITY 1.5 DJ/5.7SV SOPN Inject 1.5 mg into the skin every Thursday. 11/24/18   [provider]

## 2020-03-27 NOTE — Progress Notes (Signed)
NEUROLOGY CONSULTATION PROGRESS NOTE   Date of service: March 27, 2020 Patient Name: [name] MRN:  [id number] DOB:  [birth year]  Brief HPI   [name] is a 50 y.o. female with supermorbid obesity, seizures and additional complex PMHx documented below who presents with acute encephalopathy and jerking.  Encephalopathy has significantly improved. Continues to have asterixis/negative myoclonus. Althou, feels it is much better now.  Interval Hx   Encephalopathy has significantly improved. Continues to endorse a brain fog but able to engage in conversations and able to do simple calculation. Continues to have asterixis/negative myoclonus. Althou, feels it is much better now.  Vitals   Vitals:   03/27/20 0700 03/27/20 0800 03/27/20 0806 03/27/20 0900  BP:      Pulse: 95 100  (!) 104  Resp: 15 19  (!) 21  Temp:   98.8 F (37.1 C)   TempSrc:   Axillary   SpO2: 96% 98%  97%  Weight:      Height:         Body mass index is 60.02 kg/m.  Physical Exam   General: Laying comfortably in bed; in no acute distress. HENT: Normal oropharynx and mucosa. Normal external appearance of ears and nose. Neck: Supple, no pain or tenderness CV: No JVD. No peripheral edema. Pulmonary: Symmetric Chest rise. Normal respiratory effort. Abdomen: Soft to touch, non-tender. Ext: No cyanosis, edema, or deformity Skin: No rash. Normal palpation of skin.  Musculoskeletal: Normal digits and nails by inspection. No clubbing.  Neurologic Examination  Mental status/Cognition: Alert, oriented to self, place but not month and year, endorses problems with concentration and a brain fog. Able to do simple calculations. Speech/language: Fluent, comprehension intact, object naming intact, repetition intact. Cranial nerves:   CN II Pupils equal and reactive to light, no VF deficits   CN III,IV,VI EOM intact, no gaze preference or deviation, no nystagmus   CN V normal sensation in V1, V2, and  V3 segments bilaterally   CN VII no asymmetry, no nasolabial fold flattening   CN VIII normal hearing to speech   CN IX & X normal palatal elevation, no uvular deviation   CN XI 5/5 head turn and 5/5 shoulder shrug bilaterally   CN XII midline tongue protrusion   Motor:  Muscle bulk: normal, tone normal, pronator drift none, Asterixis/negative myoclonus noted in BL upper extremities.  Moves all extremities spotaneously and atleast antigravity. Has a R foot drop.  Sensation:  Light touch Intact in all extremities to light touch.   Pin prick    Temperature    Vibration   Proprioception    Coordination/Complex Motor:  - Finger to Nose intact BL - Heel to shin unable to do due to R foot drop and morbid obesity. - Rapid alternating movement are normal  Labs   Basic Metabolic Panel:  Lab Results  Component Value Date   NA 136 03/27/2020   K 4.0 03/27/2020   CO2 18 (L) 03/27/2020   GLUCOSE 133 (H) 03/27/2020   BUN 47 (H) 03/27/2020   CREATININE 4.32 (H) 03/27/2020   CALCIUM 8.1 (L) 03/27/2020   GFRNONAA 12 (L) 03/27/2020   GFRAA >60 03/24/2019   HbA1c:  Lab Results  Component Value Date   HGBA1C 11.9 (H) 03/08/2020   LDL:  Lab Results  Component Value Date   [facility]  04/21/2008    61        Total Cholesterol/HDL:CHD Risk Coronary Heart Disease Risk Table  Men   Women  1/2 Average Risk   3.4   3.3  Average Risk       5.0   4.4  2 X Average Risk   9.6   7.1  3 X Average Risk  23.4   11.0        Use the calculated Patient Ratio above and the CHD Risk Table to determine the patient's CHD Risk.        ATP III CLASSIFICATION (LDL):  <100     mg/dL   Optimal  481-856  mg/dL   Near or Above                    Optimal  130-159  mg/dL   Borderline  314-970  mg/dL   High  >263     mg/dL   Very High   Urine Drug Screen:     Component Value Date/Time   LABOPIA POSITIVE (A) 04/21/2008 0031   COCAINSCRNUR NONE DETECTED 04/21/2008 0031   LABBENZ  POSITIVE (A) 04/21/2008 0031   AMPHETMU NONE DETECTED 04/21/2008 0031   THCU NONE DETECTED 04/21/2008 0031   LABBARB  04/21/2008 0031    NONE DETECTED        DRUG SCREEN FOR MEDICAL PURPOSES ONLY.  IF CONFIRMATION IS NEEDED FOR ANY PURPOSE, NOTIFY LAB WITHIN 5 DAYS.        LOWEST DETECTABLE LIMITS FOR URINE DRUG SCREEN Drug Class       Cutoff (ng/mL) Amphetamine      1000 Barbiturate      200 Benzodiazepine   200 Tricyclics       300 Opiates          300 Cocaine          300 THC              50    Alcohol Level     Component Value Date/Time   [facility]  04/21/2008 0020    <5        LOWEST DETECTABLE LIMIT FOR SERUM ALCOHOL IS 5 mg/dL FOR MEDICAL PURPOSES ONLY   No results found for: PHENYTOIN, ZONISAMIDE, LAMOTRIGINE, LEVETIRACETA No results found for: PHENYTOIN, PHENOBARB, VALPROATE, CBMZ  Imaging and Diagnostic studies   CT HEAD WITHOUT CONTRAST No focal acute intracranial abnormality identified.  REEG: This study is suggestive of moderate diffuse encephalopathy, nonspecific etiology. No seizures or epileptiform discharges were seen throughout the recording.  Multiple episodes of brief whole body twitching were recorded without concomitant eeg change and were NOT epileptic.   Impression   [name] is a 50 year old female with a history of seizures, on Keppra, presenting with acute encephalopathy and tremor-like movements in the setting of AKI and polypharmacy. Encephalopathy significantly improved. The movements are much improved today and consistent with asterixis/negative myoclonus. Likely due to gabapentin toxicity in the setting of AKI.  Recommendations  - Agree with holding Gabapentin until resolution of asterixis and improvement in mentation. - Would recommend gradually reintroducing gabapentin at a lower dose of 100mg  TID and then gradually increasing as needed when asterixis is improved and AKI has  resolved. ______________________________________________________________________   Thank you for the opportunity to take part in the care of this patient. If you have any further questions, please contact the neurology consultation attending.  Signed,  [facility] Pager Number Erick Blinks

## 2020-03-27 NOTE — Progress Notes (Signed)
Dr. Celine Mans made aware of CRRT not working properly. Return and Access Pressures remained elevated despite tpa, filter change, and machine change. Dr. Celine Mans spoke with nephrology and it was decided that CRRT was to remain off tonight. MD will reassess in the morning.

## 2020-03-27 NOTE — Plan of Care (Signed)
Patient more lethargic on evaluation around 1am, and with marginal urine output, labs that had not improved, decision was made to place HD catheter for CRRT or HD.  She had been placed on bipap about 30 min prior to evaluation.  Patient was not able to consent for procedure due to confusion and lethargy, so consent was obtained from her brother over the phone.    She was kept on bipap, repeat ABG following catheter placement showed pH similar to prior 7.23.  BIPAP settings were increased from 12/6 with rate 10 to 18/8 with rate of 14.   Patient did start waking up more.  Plan to repeat ABG in a couple hours.

## 2020-03-27 NOTE — Procedures (Signed)
Central Venous Catheter Insertion Procedure Note  [name]  [id number]  [birth year]  Date:03/27/20  Time:1:35 AM   Provider Performing:Kamani Magnussen T Cyndie Chime   Procedure: Insertion of Non-tunneled Central Venous Catheter(36556)with US guidance (87564)    Indication(s) Hemodialysis  Consent Risks of the procedure as well as the alternatives and risks of each were explained to the patient and/or caregiver.  Consent for the procedure was obtained and is signed in the bedside chart  Anesthesia Topical only with 1% lidocaine   Timeout Verified patient identification, verified procedure, site/side was marked, verified correct patient position, special equipment/implants available, medications/allergies/relevant history reviewed, required imaging and test results available.  Sterile Technique Maximal sterile technique including full sterile barrier drape, hand hygiene, sterile gown, sterile gloves, mask, hair covering, sterile ultrasound probe cover (if used).  Procedure Description Area of catheter insertion was cleaned with chlorhexidine and draped in sterile fashion.   With real-time ultrasound guidance a HD catheter was placed into the right internal jugular vein.  Nonpulsatile blood flow and easy flushing noted in all ports.  The catheter was sutured in place and sterile dressing applied.  Complications/Tolerance None; patient tolerated the procedure well. Chest X-ray is ordered to verify placement for internal jugular or subclavian cannulation.  Chest x-ray is not ordered for femoral cannulation.  EBL Minimal  Specimen(s) None

## 2020-03-27 NOTE — Plan of Care (Signed)
  Problem: Clinical Measurements: Goal: Diagnostic test results will improve Outcome: Progressing   Problem: Elimination: Goal: Will not experience complications related to urinary retention Outcome: Progressing   Problem: Safety: Goal: Ability to remain free from injury will improve Outcome: Progressing   Problem: Activity: Goal: Risk for activity intolerance will decrease Outcome: Not Progressing Note: Unable to mobilize at this time due to critical status   Problem: Nutrition: Goal: Adequate nutrition will be maintained Outcome: Not Progressing Note: Unable to start diet due to BiPAP.

## 2020-03-27 NOTE — Progress Notes (Signed)
eLink Physician-Brief Progress Note Patient Name: GERALDIN HABERMEHL DOB: 1971/02/06 MRN: [id number]   Date of Service  03/27/2020  HPI/Events of Note  Patient with hyperglycemia on an Insulin infusion, she is getting D 5  LR at 125 ml / hour.  eICU Interventions  iv fluids slowed to 50 ml / hour.        Migdalia Dk 03/27/2020, 8:39 PM

## 2020-03-27 NOTE — Progress Notes (Signed)
Around 0730 this morning, CRRT machine was alarming that access and return pressures were elevated. HD catheter assess by this RN. Access (red) port flushed well and pull back blood well. Return (blue) port was difficult to flush and unable to pull back blood. When set disconnected from blue port, a 3 inch long clot was removed. RN consulted IV team to instill TPA in blue port and RN instilled red port with heparin. Dr. Signe Colt with Nephrology and Dr. Celine Mans with CCM made aware that CRRT will not be able to be restarted until the TPA is withdrawn, which is around 1120. MD okay with this and order given to prime next set with heparinized saline. If set continues to clot despite heparinized saline, RN will contact Dr. Signe Colt for additional orders.

## 2020-03-28 DIAGNOSIS — G9341 Metabolic encephalopathy: Secondary | ICD-10-CM | POA: Diagnosis not present

## 2020-03-28 DIAGNOSIS — G894 Chronic pain syndrome: Secondary | ICD-10-CM

## 2020-03-28 DIAGNOSIS — E118 Type 2 diabetes mellitus with unspecified complications: Secondary | ICD-10-CM

## 2020-03-28 DIAGNOSIS — N171 Acute kidney failure with acute cortical necrosis: Secondary | ICD-10-CM | POA: Diagnosis not present

## 2020-03-28 LAB — GLUCOSE, CAPILLARY
Glucose-Capillary: 105 mg/dL — ABNORMAL HIGH (ref 70–99)
Glucose-Capillary: 111 mg/dL — ABNORMAL HIGH (ref 70–99)
Glucose-Capillary: 119 mg/dL — ABNORMAL HIGH (ref 70–99)
Glucose-Capillary: 122 mg/dL — ABNORMAL HIGH (ref 70–99)
Glucose-Capillary: 124 mg/dL — ABNORMAL HIGH (ref 70–99)
Glucose-Capillary: 126 mg/dL — ABNORMAL HIGH (ref 70–99)
Glucose-Capillary: 131 mg/dL — ABNORMAL HIGH (ref 70–99)
Glucose-Capillary: 157 mg/dL — ABNORMAL HIGH (ref 70–99)
Glucose-Capillary: 189 mg/dL — ABNORMAL HIGH (ref 70–99)
Glucose-Capillary: 207 mg/dL — ABNORMAL HIGH (ref 70–99)

## 2020-03-28 LAB — RENAL FUNCTION PANEL
Albumin: 2.6 g/dL — ABNORMAL LOW (ref 3.5–5.0)
Anion gap: 9 (ref 5–15)
BUN: 39 mg/dL — ABNORMAL HIGH (ref 6–20)
CO2: 21 mmol/L — ABNORMAL LOW (ref 22–32)
Calcium: 7.7 mg/dL — ABNORMAL LOW (ref 8.9–10.3)
Chloride: 104 mmol/L (ref 98–111)
Creatinine, Ser: 1.8 mg/dL — ABNORMAL HIGH (ref 0.44–1.00)
GFR, Estimated: 34 mL/min — ABNORMAL LOW (ref >60)
Glucose, Bld: 135 mg/dL — ABNORMAL HIGH (ref 70–99)
Phosphorus: 3.7 mg/dL (ref 2.5–4.6)
Potassium: 4.3 mmol/L (ref 3.5–5.1)
Sodium: 134 mmol/L — ABNORMAL LOW (ref 135–145)

## 2020-03-28 LAB — URINE CULTURE: Culture: NO GROWTH

## 2020-03-28 LAB — APTT: aPTT: 41 seconds — ABNORMAL HIGH (ref 24–36)

## 2020-03-28 LAB — PROCALCITONIN: Procalcitonin: 0.8 ng/mL

## 2020-03-28 LAB — MAGNESIUM: Magnesium: 1.7 mg/dL (ref 1.7–2.4)

## 2020-03-28 MED ORDER — INSULIN ASPART 100 UNIT/ML ~~LOC~~ SOLN
0.0000 [IU] | Freq: Three times a day (TID) | SUBCUTANEOUS | Status: DC
  Administered 2020-03-28: 4 [IU] via SUBCUTANEOUS
  Administered 2020-03-29 (×2): 3 [IU] via SUBCUTANEOUS
  Administered 2020-03-29: 7 [IU] via SUBCUTANEOUS
  Administered 2020-03-30: 4 [IU] via SUBCUTANEOUS
  Administered 2020-03-31: 7 [IU] via SUBCUTANEOUS
  Administered 2020-03-31: 20 [IU] via SUBCUTANEOUS
  Administered 2020-03-31 – 2020-04-01 (×2): 4 [IU] via SUBCUTANEOUS
  Administered 2020-04-01: 7 [IU] via SUBCUTANEOUS
  Administered 2020-04-01: 11 [IU] via SUBCUTANEOUS
  Administered 2020-04-02 (×2): 7 [IU] via SUBCUTANEOUS
  Administered 2020-04-02: 11 [IU] via SUBCUTANEOUS
  Administered 2020-04-03: 4 [IU] via SUBCUTANEOUS
  Administered 2020-04-03 (×2): 7 [IU] via SUBCUTANEOUS

## 2020-03-28 MED ORDER — OXYCODONE HCL 5 MG PO TABS
5.0000 mg | ORAL_TABLET | Freq: Four times a day (QID) | ORAL | Status: DC | PRN
  Administered 2020-03-28 (×3): 5 mg via ORAL
  Filled 2020-03-28 (×3): qty 1

## 2020-03-28 MED ORDER — INSULIN ASPART 100 UNIT/ML ~~LOC~~ SOLN
0.0000 [IU] | Freq: Every day | SUBCUTANEOUS | Status: DC
  Administered 2020-03-30: 2 [IU] via SUBCUTANEOUS
  Administered 2020-03-31: 3 [IU] via SUBCUTANEOUS
  Administered 2020-04-01 – 2020-04-02 (×2): 2 [IU] via SUBCUTANEOUS

## 2020-03-28 MED ORDER — DULOXETINE HCL 60 MG PO CPEP
60.0000 mg | ORAL_CAPSULE | Freq: Two times a day (BID) | ORAL | Status: DC
Start: 2020-03-28 — End: 2020-04-04
  Administered 2020-03-28 – 2020-04-03 (×13): 60 mg via ORAL
  Filled 2020-03-28: qty 2
  Filled 2020-03-28 (×12): qty 1

## 2020-03-28 MED ORDER — AMITRIPTYLINE HCL 25 MG PO TABS
25.0000 mg | ORAL_TABLET | Freq: Every day | ORAL | Status: DC
  Administered 2020-03-28 – 2020-04-03 (×7): 25 mg via ORAL
  Filled 2020-03-28 (×7): qty 1

## 2020-03-28 MED ORDER — ELETRIPTAN HYDROBROMIDE 20 MG PO TABS
40.0000 mg | ORAL_TABLET | ORAL | Status: DC | PRN
  Administered 2020-03-29: 40 mg via ORAL
  Filled 2020-03-28 (×3): qty 2

## 2020-03-28 MED ORDER — OXYCODONE HCL 5 MG PO TABS
10.0000 mg | ORAL_TABLET | Freq: Four times a day (QID) | ORAL | Status: DC | PRN
  Administered 2020-03-28 – 2020-03-29 (×3): 10 mg via ORAL
  Filled 2020-03-28 (×3): qty 2

## 2020-03-28 MED ORDER — INSULIN ASPART PROT & ASPART (70-30 MIX) 100 UNIT/ML ~~LOC~~ SUSP
75.0000 [IU] | Freq: Two times a day (BID) | SUBCUTANEOUS | Status: DC
  Administered 2020-03-28 – 2020-04-03 (×14): 75 [IU] via SUBCUTANEOUS
  Filled 2020-03-28 (×3): qty 10

## 2020-03-28 MED ORDER — INSULIN ASPART 100 UNIT/ML ~~LOC~~ SOLN: 20.0000 [IU] | Freq: Three times a day (TID) | SUBCUTANEOUS | Status: DC

## 2020-03-28 NOTE — Progress Notes (Signed)
[facility] Progress Note   Assessment/ Plan:   Assessment/ Plan: 1. AKI - prob due to vol depletion, hx of the same on many prior admits. Prob due to uncont DM2 and the osmotic fluid losses associated-- other option is polypharmacy. Creat baseline 0.9. Creat here 4.89 prob due to vol loss/ hemodynamics +/- ATN. UA unremarkable, CT w/o obstruction. Pt has really bad myoclonic jerking/ asterixis w/ advanced AKI , eGFR 10 ml/min.  Had CRRT briefly overnight 03/26/20 and IVFs with quick resolution of AKI.  Cr down to 1.8 today, making good urine. Do not anticipate her needing more RRT.  Will place order to remove catheter.   2. IDDM 3. Depression / chronic pain - on multiple meds, seen by neurology- please avoid gabapentin/ baclofen combination (given her h/o recurrent AKI should not be on baclofen at all) 4. Seizures - on keppra , to continue per neuro 5. Vol depletion - improved on IVF"s.  6. Dispo: will sign off.  Call with questions   Subjective:    Catheter didn't work yesterday despite tPA x 2 so didn't get any CRRT.  Cr down to 1.8 today, no twitching, able to sleep on BiPaP.     Objective:   BP 101/60   Pulse 97   Temp 100 F (37.8 C) (Axillary)   Resp 15   Ht 5' 5" (1.651 m)   Wt (!) 168.5 kg   SpO2 100%   BMI 61.82 kg/m   Physical Exam: Gen: obese, NAD CVS: RRR Resp: clear Abd: soft Ext: no LE edema ACCESS: R IJ nontunneled HD cath MSK: no more jerking movements  Labs: BMET Recent Labs  Lab 03/26/20 0130 03/26/20 0824 03/26/20 2005 03/27/20 0126 03/27/20 0149 03/27/20 0408 03/27/20 1615 03/28/20 0434  NA 133* 130* 134* 134* 136 136 133* 134*  K 4.5 4.9 5.3* 4.2 4.1 4.0 4.9 4.3  CL 100  --  104 103  --   --  101 104  CO2 18*  --  15* 18*  --   --  19* 21*  GLUCOSE 336*  --  193* 133*  --   --  147* 135*  BUN 39*  --  44* 47*  --   --  39* 39*  CREATININE 4.89*  --  4.71* 4.32*  --   --  2.42* 1.80*  CALCIUM 8.6*  --  8.0* 8.1*  --   --   7.6* 7.7*  PHOS  --   --   --  5.8*  --   --  4.2 3.7   CBC Recent Labs  Lab 03/26/20 0130 03/26/20 0824 03/27/20 0126 03/27/20 0149 03/27/20 0408  WBC 14.8*  --  11.5*  --   --   NEUTROABS 9.2*  --   --   --   --   HGB 13.8 12.9 12.7 12.9 13.3  HCT 44.0 38.0 38.4 38.0 39.0  MCV 97.3  --  95.0  --   --   PLT 279  --  211  --   --       Medications:    . alteplase  2 mg Intracatheter Once  . atorvastatin  20 mg Oral Daily  . chlorhexidine  15 mL Mouth Rinse BID  . Chlorhexidine Gluconate Cloth  6 each Topical Q0600  . fentaNYL  1 patch Transdermal Q72H  . heparin  5,000 Units Subcutaneous Q8H  . insulin aspart  0-20 Units Subcutaneous TID WC  . insulin aspart  0-5   Units Subcutaneous QHS  . insulin aspart  20 Units Subcutaneous TID WC  . insulin aspart protamine- aspart  75 Units Subcutaneous BID WC  . levothyroxine  150 mcg Oral Daily  . lidocaine  4 patch Transdermal Q24H  . mouth rinse  15 mL Mouth Rinse q12n4p  . pantoprazole  40 mg Oral Daily  . sodium chloride flush  3 mL Intravenous Q12H     Madelon Lips MD 03/28/2020, 10:16 AM

## 2020-03-28 NOTE — Progress Notes (Signed)
RT NOTE: patient still currently sleeping and resting comfortably on bipap.  Will remove bipap once patient is awake.  Will continue to monitor.

## 2020-03-28 NOTE — Progress Notes (Signed)
Brief Neuro Update:  Patient sleeping today and bedside. Did not wake her up. Discussed with bedside RN and primary team. Her tremors have resolved. Will recommend gradually introducing Gabapentin and baclofen as needed at small doses for pain and gradually uptitrating it as needed and as tolerated.  Neurology inpatient team will signoff. Please feel free to contact us with any questions or concerns.  Erick Blinks [facility] Pager Number [phone]

## 2020-03-28 NOTE — Progress Notes (Signed)
Inpatient Diabetes Program Recommendations  AACE/ADA: New Consensus Statement on Inpatient Glycemic Control (2015)  Target Ranges:  Prepandial:   less than 140 mg/dL      Peak postprandial:   less than 180 mg/dL (1-2 hours)      Critically ill patients:  140 - 180 mg/dL   Lab Results  Component Value Date   GLUCAP 124 (H) 03/28/2020   HGBA1C 11.9 (H) 03/08/2020    Review of Glycemic Control Results for GIZELLE, WHETSEL (MRN [id number]) as of 03/28/2020 10:12  Ref. Range 03/28/2020 05:06 03/28/2020 07:25 03/28/2020 09:43  Glucose-Capillary Latest Ref Range: 70 - 99 mg/dL 403 (H) 979 (H) 536 (H)   Diabetes history: Type 2 DM Outpatient Diabetes medications: 70/30 75 units bid + Januvia 50  Current orders for Inpatient glycemic control: IV insulin  Inpatient Diabetes Program Recommendations:    Noted plan for transition. Typically Novolog 70/30 not given in addition to meal coverage. Consider discontinuing Novolog 20 units TID.  Based on current needs, assuming patient may need adjustment to home dose.  Received total of 261 units in 24 hours on 03/27/20. Secure chat sent to NP.  Thanks, Lujean Rave, MSN, RNC-OB Diabetes Coordinator [phone] (8a-5p)

## 2020-03-28 NOTE — Progress Notes (Signed)
NAME:  [name], MRN:  [id number], DOB:  May 24, 1970, LOS: 2 ADMISSION DATE:  03/26/2020, CONSULTATION DATE:  03/26/20 REFERRING MD:  Lorin Mercy - [facility] , CHIEF COMPLAINT:  AMS  Brief History:  50 yo on several AEDs at home brought to ED for AMS. Has AKI. Worse AMS in ED -- PCCM consulted   History of Present Illness:  50 yo hx morbid obesity, history of osa / ohs, seizure disorder, chronic pain syndromes, migraine who presented to ED 1/2 with AMS, fever. Of note pt recently admitted 12/14 with increasing tremor and AKI at which time no sz activity was noted and OP neuro follow up recommended. On presentation 1/2, pt significant other reports AMS and notes recent UTI. On arrival to ED, pt complaining of back pain. Started on empiric abx Admitted to [facility].  Labs reveal AKI with Cr. 4.89.  K 4.5, Glu 336, LA 2.5 WBC 14.8  ABG 7.27/40/65 UA not consistent with infection    In ED, worsening AMS and change is respiratory pattern -- PCCM consulted in this setting  Past Medical History:  AKI Anxiety Depression DM2 DJD Fibromyalgia GERD MRSA infection HTN Migraine PVD Foot drop OSA, OHS  Umbilical hernia Morbid obesity  Chronic pain  Hypothyroidism Seizure disorder   Significant Hospital Events:  1/2 presented to ED for AMS. CCM consult for worsening AMS, changing resp pattern. Admit to [facility] 1/3 PCCM consulted  Consults:  PCCM nephro Neuro   Procedures:    Significant Diagnostic Tests:    Micro Data:  1/2 COVID neg 1/2 BCx> 1/2 UCx>   Antimicrobials:  1/2 vanc 1/2 cefepime 1/2 flagyl  Interim History / Subjective:  Received a total of 6 hours CRRT before HD cath clotted off. Creatinine improving  CRRT has been stopped 851 cc's urine output last 24 hours Remains net + 8900 cc's per I&O Remains on Insulin gtt, gap is 9 Mag 1.7 PCT 0.80   Objective   Blood pressure (!) 99/59, pulse 97, temperature 100 F (37.8 C), temperature source Axillary, resp. rate 15,  height 5' 5" (1.651 m), weight (!) 168.5 kg, SpO2 100 %.    FiO2 (%):  [40 %] 40 %   Intake/Output Summary (Last 24 hours) at 03/28/2020 0940 Last data filed at 03/28/2020 0600 Gross per 24 hour  Intake 3120.92 ml  Output 793 ml  Net 2327.92 ml   Filed Weights   03/26/20 0224 03/28/20 0000 03/28/20 0500  Weight: (!) 163.6 kg (!) 170.2 kg (!) 168.5 kg    Examination: General: Morbidly obese middle aged F, supine in bed in NAD HENT: NCAT, short neck with redundant tissue. Anicteric sclera, No LAD Lungs: Bilateral chest excursion, Distant breath sounds.  even, unlabored shallow respirations. Remains on 2L [place], BiPAP at night Cardiovascular:S1, S2,  rrr distant heart sounds cap refill brisk  Abdomen: Obese, soft, ndnt , BS +, Tolerating diet Extremities: No obvious joint deformity no cyanosis or clubbing  Neuro: Awake, alert. Oriented to place and self, needs help with time and date,  myoclonic twitching has improved GU: foley with adequate UO, clear amber with some sediment Psych:  Calm, cooperative. Intermittently  confused  Resolved Hospital Problem list     Assessment & Plan:   Acute encephalopathy, suspect toxic/metabolic - suspect gabapentin toxicity in setting of renal failure, also with hyperglycemic state -ABG without hypercarbia  Myoclonus  Hx seizure disorder P -appreciate neuro consult, nephro consult  -limit CNS depressing medications  -holding home medications, will slowly fold  back some pain medications per patient request.  Fentanyl patch only, got somnolent with oxycodone.   Morbid Obesity Acute respiratory insufficiency with hypoxia Hx Sleep Apnea  -see in hx sleep apnea, pt denies, is not on CPAP at home. -wonder if abnormal breathing in ED when pt somnolent was this  P -might benefit from noct BiPAP or CPAP -Supplemental O2 for SpO2 > 92% -elevate HOB, IS  - needs formal OSA eval as outpatient>> Pulmonary  AKI -CT renal stone study without stones,  hydronephrosis P - nephro consult. Tolerated a 6  hours of CRRT before catheter clotted  had TPA x2 without improvement of flows. Discussed with Dr. Kruska, will hold for now since her mental status is  Not worsening at this time.  - trend renal indices, I/O - maintain foley catheter  - hold statin  - Follow urine Cx for final result   Chronic pain Fibromyalgia Depression -hold home meds   Hypothyroidism -synthroid   DM2 with HHS. - Plan to  Transition off insulin gtt, as taking po's well - Carb modified diet  Once transitioned off Insulin gtt, can go to Med Surg Tele bed  bed with CPAP nocturnally. CPAP of at 12 , with RT to titrate.   Best practice (evaluated daily)  Diet: Carb modified Pain/Anxiety/Delirium protocol (if indicated): n/a VAP protocol (if indicated): n/a DVT prophylaxis: SQ hep GI prophylaxis: -- Protonix Glucose control: SSI Mobility: rec PT/OT  Disposition: Med Surg   Goals of Care:  Last date of multidisciplinary goals of care discussion:-- Family and staff present: -- Summary of discussion: -- Follow up goals of care discussion due: -- Code Status: Full   I have called and updated patient's brother in full regarding current status and plan of care. He verbalized understanding and had no further questions.    F. , MSN, AGACNP-BC [facility] See Amion for personal pager 03/28/2020 10:12 AM   Labs   CBC: Recent Labs  Lab 03/26/20 0130 03/26/20 0824 03/27/20 0126 03/27/20 0149 03/27/20 0408  WBC 14.8*  --  11.5*  --   --   NEUTROABS 9.2*  --   --   --   --   HGB 13.8 12.9 12.7 12.9 13.3  HCT 44.0 38.0 38.4 38.0 39.0  MCV 97.3  --  95.0  --   --   PLT 279  --  211  --   --     Basic Metabolic Panel: Recent Labs  Lab 03/26/20 0130 03/26/20 0824 03/26/20 2005 03/27/20 0126 03/27/20 0149 03/27/20 0408 03/27/20 1615 03/28/20 0434  NA 133*   < > 134* 134* 136 136 133* 134*  K 4.5   < > 5.3*  4.2 4.1 4.0 4.9 4.3  CL 100  --  104 103  --   --  101 104  CO2 18*  --  15* 18*  --   --  19* 21*  GLUCOSE 336*  --  193* 133*  --   --  147* 135*  BUN 39*  --  44* 47*  --   --  39* 39*  CREATININE 4.89*  --  4.71* 4.32*  --   --  2.42* 1.80*  CALCIUM 8.6*  --  8.0* 8.1*  --   --  7.6* 7.7*  MG  --   --   --  1.4*  --   --   --  1.7  PHOS  --   --   --    5.8*  --   --  4.2 3.7   < > = values in this interval not displayed.   GFR: Estimated Creatinine Clearance: 59.3 mL/min (A) (by C-G formula based on SCr of 1.8 mg/dL (H)). Recent Labs  Lab 03/26/20 0130 03/26/20 0943 03/27/20 0126 03/28/20 0434  PROCALCITON  --  0.95 0.86 0.80  WBC 14.8*  --  11.5*  --   LATICACIDVEN 2.5* 1.4  --   --     Liver Function Tests: Recent Labs  Lab 03/26/20 0130 03/27/20 0126 03/27/20 1615 03/28/20 0434  AST 57*  --   --   --   ALT 41  --   --   --   ALKPHOS 109  --   --   --   BILITOT 0.6  --   --   --   PROT 6.8  --   --   --   ALBUMIN 3.6 3.2* 2.9* 2.6*   No results for input(s): LIPASE, AMYLASE in the last 168 hours. No results for input(s): AMMONIA in the last 168 hours.  ABG    Component Value Date/Time   PHART 7.235 (L) 03/27/2020 0408   PCO2ART 50.2 (H) 03/27/2020 0408   PO2ART 115 (H) 03/27/2020 0408   HCO3 21.3 03/27/2020 0408   TCO2 23 03/27/2020 0408   ACIDBASEDEF 6.0 (H) 03/27/2020 0408   O2SAT 98.0 03/27/2020 0408     Coagulation Profile: Recent Labs  Lab 03/26/20 0130  INR 1.1    Cardiac Enzymes: No results for input(s): CKTOTAL, CKMB, CKMBINDEX, TROPONINI in the last 168 hours.  HbA1C: Hgb A1c MFr Bld  Date/Time Value Ref Range Status  03/08/2020 03:03 AM 11.9 (H) 4.8 - 5.6 % Final    Comment:    (NOTE) Pre diabetes:          5.7%-6.4%  Diabetes:              >6.4%  Glycemic control for   <7.0% adults with diabetes   03/25/2019 02:38 AM 10.9 (H) 4.8 - 5.6 % Final    Comment:    (NOTE) Pre diabetes:          5.7%-6.4% Diabetes:               >6.4% Glycemic control for   <7.0% adults with diabetes     CBG: Recent Labs  Lab 03/28/20 0006 03/28/20 0102 03/28/20 0303 03/28/20 0506 03/28/20 0725  GLUCAP 122* 126* 119* 131* 111*   Subjective Endorses low back pain, fatigue, mild SOB  Allergies Allergies  Allergen Reactions  . Penicillins Hives and Other (See Comments)    HAS PT DEVELOPED SEVERE RASH INVOLVING MUCUS MEMBRANES or SKIN NECROSIS: #  #  YES  #  # PATIENT HAS HAD A PCN REACTION THAT REQUIRED HOSPITALIZATION:  #  #  YES  #  #   Tolerates amoxicillin on multiple occasions per Dr. Ingram's note 11/01/17.  TDD.  . Clindamycin/Lincomycin Hives, Dermatitis and Rash  . 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 Sulfa Antibiotics Hives  . Versed [Midazolam] Nausea And Vomiting    Pt had medication on October 16 2017 with no issues     Home Medications  Prior to Admission medications   Medication Sig Start Date End Date Taking? Authorizing Provider  acetaminophen (TYLENOL) 500 MG tablet Take 1,000 mg by mouth 2 (two) times daily as needed for moderate pain or headache.      [provider]  amitriptyline (ELAVIL) 25 MG tablet Take 50 mg by mouth at bedtime. 02/07/20   [provider]  ARIPiprazole (ABILIFY) 5 MG tablet Take 5 mg by mouth daily. 03/04/19   [provider]  atorvastatin (LIPITOR) 20 MG tablet Take 20 mg by mouth daily.    [provider]  baclofen (LIORESAL) 10 MG tablet Take 10 mg by mouth 3 (three) times daily. 01/13/20   [provider]  Biotin 10000 MCG TABS Take 10,000 mcg by mouth daily.    [provider]  carisoprodol (SOMA) 350 MG tablet Take 350 mg by mouth 2 (two) times daily.    [provider]  cetirizine (ZYRTEC) 10 MG tablet Take 10 mg by mouth daily as needed for allergies.    [provider]  Cyanocobalamin (VITAMIN B-12 IJ) Inject 1 each as directed every 30 (thirty) days.     [provider]  Diclofenac Sodium 2 % SOLN Place 2-4 g onto the skin 4 (four) times daily as needed. Patient taking differently: Place 4 g onto the skin 4 (four) times daily as needed (pain). 10/08/18   Mcarthur Rossetti, MD  DULoxetine (CYMBALTA) 60 MG capsule Take 60 mg by mouth 2 (two) times daily.    [provider]  eletriptan (RELPAX) 40 MG tablet Take 40 mg by mouth as needed for migraine or headache. May repeat in 2 hours if headache persists or recurs.    [provider]  esomeprazole (NEXIUM) 40 MG capsule Take 40 mg by mouth daily before breakfast. Name Brand Only    [provider]  fentaNYL (DURAGESIC) 50 MCG/HR Place 50 mcg onto the skin every 3 (three) days.    [provider]  furosemide (LASIX) 20 MG tablet Take 20 mg by mouth daily as needed for fluid.     [provider]  gabapentin (NEURONTIN) 600 MG tablet Take 0.5 tablets (300 mg total) by mouth 3 (three) times daily. 03/12/20   Domenic Polite, MD  hydrOXYzine (VISTARIL) 25 MG capsule Take 25 mg by mouth 3 (three) times daily as needed for anxiety.    [provider]  insulin aspart (NOVOLOG) 100 UNIT/ML FlexPen Inject 75 Units into the skin See admin instructions. Injects 75 units under the skin before breakfast; injects 25 units at lunch; injects 75 units at dinner time    [provider]  levETIRAcetam (KEPPRA) 750 MG tablet Take 750 mg by mouth 2 (two) times daily. 03/04/19   [provider]  levothyroxine (SYNTHROID) 150 MCG tablet Take 150 mcg by mouth daily.    [provider]  lidocaine (LIDODERM) 5 % Place 4 patches onto the skin daily. 05/30/17   [provider]  lisinopril (ZESTRIL) 20 MG tablet Take 20 mg by mouth daily. 03/02/20   [provider]  NARCAN 4 MG/0.1ML LIQD nasal spray kit Place 4 mg into the nose daily as needed (opioid overdose). 10/22/17   [provider]  nystatin  (MYCOSTATIN/NYSTOP) 100000 UNIT/GM POWD Apply 1 g topically daily.    [provider]  Oxycodone HCl 10 MG TABS Take 10 mg by mouth 4 (four) times daily.    [provider]  PE-Triprolidine-DM-GG-APAP [facility] CLEAR & COOL DAY/NIGHT PO) Take 1 tablet by mouth every 6 (six) hours as needed (cold symptoms).    [provider]  Phenyleph-Doxylamine-DM-APAP (NYQUIL SEVERE COLD/FLU) 5-6.25-10-325 MG/15ML LIQD Take 30 mLs by mouth every 8 (eight) hours as needed (cold symptoms).  [provider]  promethazine (PHENERGAN) 25 MG tablet Take 25 mg by mouth every 6 (six) hours as needed for nausea or vomiting.    [provider]  senna (SENOKOT) 8.6 MG TABS tablet Take 8.6 mg by mouth daily as needed for mild constipation.     [provider]  sitaGLIPtin (JANUVIA) 50 MG tablet Take 50 mg by mouth daily.    [provider]  topiramate (TOPAMAX) 200 MG tablet Take 200 mg by mouth 2 (two) times daily.    [provider]  TRULICITY 1.5 HK/7.4QV SOPN Inject 1.5 mg into the skin every Thursday. 11/24/18   [provider]    Magdalen Spatz, MSN, AGACNP-BC [place] for personal pager PCCM on call pager [phone] 03/28/2020 9:41 AM

## 2020-03-29 DIAGNOSIS — E118 Type 2 diabetes mellitus with unspecified complications: Secondary | ICD-10-CM | POA: Diagnosis not present

## 2020-03-29 DIAGNOSIS — N171 Acute kidney failure with acute cortical necrosis: Secondary | ICD-10-CM | POA: Diagnosis not present

## 2020-03-29 DIAGNOSIS — G894 Chronic pain syndrome: Secondary | ICD-10-CM | POA: Diagnosis not present

## 2020-03-29 DIAGNOSIS — G253 Myoclonus: Secondary | ICD-10-CM | POA: Diagnosis not present

## 2020-03-29 DIAGNOSIS — T50905A Adverse effect of unspecified drugs, medicaments and biological substances, initial encounter: Secondary | ICD-10-CM

## 2020-03-29 LAB — RENAL FUNCTION PANEL
Albumin: 2.5 g/dL — ABNORMAL LOW (ref 3.5–5.0)
Anion gap: 7 (ref 5–15)
BUN: 35 mg/dL — ABNORMAL HIGH (ref 6–20)
CO2: 23 mmol/L (ref 22–32)
Calcium: 8.2 mg/dL — ABNORMAL LOW (ref 8.9–10.3)
Chloride: 106 mmol/L (ref 98–111)
Creatinine, Ser: 1.17 mg/dL — ABNORMAL HIGH (ref 0.44–1.00)
GFR, Estimated: 56 mL/min — ABNORMAL LOW (ref >60)
Glucose, Bld: 192 mg/dL — ABNORMAL HIGH (ref 70–99)
Phosphorus: 2.8 mg/dL (ref 2.5–4.6)
Potassium: 4.7 mmol/L (ref 3.5–5.1)
Sodium: 136 mmol/L (ref 135–145)

## 2020-03-29 LAB — CBC
HCT: 31.6 % — ABNORMAL LOW (ref 36.0–46.0)
Hemoglobin: 10.4 g/dL — ABNORMAL LOW (ref 12.0–15.0)
MCH: 31.5 pg (ref 26.0–34.0)
MCHC: 32.9 g/dL (ref 30.0–36.0)
MCV: 95.8 fL (ref 80.0–100.0)
Platelets: 110 10*3/uL — ABNORMAL LOW (ref 150–400)
RBC: 3.3 MIL/uL — ABNORMAL LOW (ref 3.87–5.11)
RDW: 14.3 % (ref 11.5–15.5)
WBC: 6.3 10*3/uL (ref 4.0–10.5)
nRBC: 0 % (ref 0.0–0.2)

## 2020-03-29 LAB — GLUCOSE, CAPILLARY
Glucose-Capillary: 204 mg/dL — ABNORMAL HIGH (ref 70–99)
Glucose-Capillary: 145 mg/dL — ABNORMAL HIGH (ref 70–99)
Glucose-Capillary: 145 mg/dL — ABNORMAL HIGH (ref 70–99)
Glucose-Capillary: 93 mg/dL (ref 70–99)

## 2020-03-29 MED ORDER — SODIUM CHLORIDE 0.9 % IV SOLN: 2.0000 g | Freq: Three times a day (TID) | INTRAVENOUS | Status: DC

## 2020-03-29 MED ORDER — OXYCODONE HCL 5 MG PO TABS
10.0000 mg | ORAL_TABLET | ORAL | Status: DC | PRN
  Administered 2020-03-29 – 2020-04-03 (×24): 10 mg via ORAL
  Filled 2020-03-29 (×24): qty 2

## 2020-03-29 MED ORDER — CARISOPRODOL 350 MG PO TABS
350.0000 mg | ORAL_TABLET | Freq: Two times a day (BID) | ORAL | Status: DC | PRN
Start: 2020-03-29 — End: 2020-04-04
  Administered 2020-04-02 – 2020-04-03 (×2): 350 mg via ORAL
  Filled 2020-03-29 (×2): qty 1

## 2020-03-29 MED ORDER — GABAPENTIN 600 MG PO TABS
300.0000 mg | ORAL_TABLET | Freq: Three times a day (TID) | ORAL | Status: DC
  Administered 2020-03-29 – 2020-04-03 (×17): 300 mg via ORAL
  Filled 2020-03-29 (×17): qty 1

## 2020-03-29 MED ORDER — BACLOFEN 5 MG HALF TABLET: 5.0000 mg | ORAL_TABLET | Freq: Two times a day (BID) | ORAL | Status: DC | PRN

## 2020-03-29 NOTE — Progress Notes (Signed)
Pharmacy Antibiotic Note  [name] is a 50 y.o. female admitted on 03/26/2020 with sepsis.  Pharmacy consulted 03/26/20 for Cefepime dosing.  WBC has trended down> within normal.  Afebrile Acute renal failure improved , CRRT stopped 1/3. SCr trending down 4.71 > 2.42 > 1.80> down to 1.17 today (03/29/20). Urine ouput 2450 ml/24hr, net -1303.8 ml.   Antibiotics this admission:  Vanc 2g at 0430 on 1/2 Cefepime 1/2 >>  Dose adjustments:  1/5: Will  increase cefepime to 2g every 8 hours due to improved renal function.  Microbiology: 1/2 MRSA PCR - negative 1/2 UCx (PM) - negative 1/2 UCx  (AM)- negative 1/2 BCx - NGTD 1/2 COVID: neg; flu: neg   Plan: Increase Cefepime to 2g IV every 8 hours Trend WBC, temp, renal function  F/U infectious work-up   Height: 5\' 5"  (165.1 cm) Weight: (!) 167.3 kg (368 lb 13.3 oz) IBW/kg (Calculated) : 57  Temp (24hrs), Avg:98.5 F (36.9 C), Min:98.2 F (36.8 C), Max:98.7 F (37.1 C)  Recent Labs  Lab 03/26/20 0130 03/26/20 0943 03/26/20 2005 03/27/20 0126 03/27/20 1615 03/28/20 0434 03/29/20 0155  WBC 14.8*  --   --  11.5*  --   --  6.3  CREATININE 4.89*  --  4.71* 4.32* 2.42* 1.80* 1.17*  LATICACIDVEN 2.5* 1.4  --   --   --   --   --     Estimated Creatinine Clearance: 90.8 mL/min (A) (by C-G formula based on SCr of 1.17 mg/dL (H)).    Allergies  Allergen Reactions  . Penicillins Hives and Other (See Comments)    HAS PT DEVELOPED SEVERE RASH INVOLVING MUCUS MEMBRANES or SKIN NECROSIS: #  #  YES  #  # PATIENT HAS HAD A PCN REACTION THAT REQUIRED HOSPITALIZATION:  #  #  YES  #  #   Tolerates amoxicillin on multiple occasions per Dr. 05/27/20 note 11/01/17.  TDD.  . Clindamycin/Lincomycin Hives, Dermatitis and Rash  . 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 Sulfa Antibiotics Hives  . Versed [Midazolam] Nausea And Vomiting    Pt had medication on October 16 2017 with no issues    Thank  you for allowing pharmacy to be part of this patients care team.  07-24-1981, RPh Clinical Pharmacist  Please check AMION for all [facility] Pharmacy phone numbers After 10:00 PM, call Main Pharmacy [phone] 03/29/2020 9:31 AM

## 2020-03-29 NOTE — Progress Notes (Signed)
Pt. Requesting to have CPAP off at this time. Removed by RN. RT called to make aware.

## 2020-03-29 NOTE — Plan of Care (Signed)
  Problem: Clinical Measurements: Goal: Diagnostic test results will improve Outcome: Progressing Goal: Respiratory complications will improve Outcome: Progressing   Problem: Safety: Goal: Ability to remain free from injury will improve Outcome: Progressing   

## 2020-03-29 NOTE — Progress Notes (Signed)
RE:   [name]                                                                            Date of Birth:      April 16, 2070                                                        Date:          03/29/20                                                        To Whom It May Concern:  Please be advised that the above-named patient will require a short-term nursing home stay - anticipated 30 days or less for rehabilitation and strengthening.  The plan is for return home.

## 2020-03-29 NOTE — Evaluation (Signed)
Physical Therapy Evaluation Patient Details Name: EDMUND RICK MRN: [id number] DOB: 1971/02/25 Today's Date: 03/29/2020   History of Present Illness  DALAYA SUPPA is a 50 y.o. female with medical history significant of OSA, PVD, OHS, hypothyroidism, HTN, DM;,depression/anxiety, seizures and chronic pain presenting with lethargy, AMS and tremor like movements in setting of AKI and polypharmarcy. Previously admitted 12/14-19 with AKI, thought to be due to hyperglycemia. CT head negative for acute intracranial abnormality.  Clinical Impression  Prior to admission, pt lives with her boyfriend and is independent with mobility using a cane and a right AFO. Pt presents with generalized weakness, pain, balance deficits, and decreased activity tolerance. Requiring two person moderate assist for bed mobility and transfers to standing using a walker. Recommending post acute rehab to address deficits and maximize functional mobility.     Follow Up Recommendations SNF    Equipment Recommendations  Other (comment) (defer)    Recommendations for Other Services       Precautions / Restrictions Precautions Precautions: Fall;Other (comment) Precaution Comments: drop foot RLE, wears AFO Restrictions Weight Bearing Restrictions: No      Mobility  Bed Mobility Overal bed mobility: Needs Assistance Bed Mobility: Supine to Sit;Sit to Supine     Supine to sit: Mod assist;+2 for physical assistance Sit to supine: Mod assist;+2 for physical assistance   General bed mobility comments: Pt able to bring BLE's off edge of bed, assist to pull trunk to upright. Heavy modA to elevate BLE's back into bed    Transfers Overall transfer level: Needs assistance Equipment used: Rolling walker (2 wheeled) Transfers: Sit to/from Stand Sit to Stand: Mod assist;+2 physical assistance         General transfer comment: ModA + 2 to rise from edge of bed, cues for upright posture. Able to take side steps  up to the head of the bed  Ambulation/Gait                Stairs            Wheelchair Mobility    Modified Rankin (Stroke Patients Only)       Balance Overall balance assessment: Needs assistance Sitting-balance support: No upper extremity supported;Feet supported Sitting balance-Leahy Scale: Good Sitting balance - Comments: Brushing teeth at EOB without loss of balance   Standing balance support: Bilateral upper extremity supported Standing balance-Leahy Scale: Poor Standing balance comment: reliant on UE support of RW                             Pertinent Vitals/Pain Pain Assessment: Faces Faces Pain Scale: Hurts even more Pain Location: back, R buttocks Pain Descriptors / Indicators: Grimacing;Tightness Pain Intervention(s): Limited activity within patient's tolerance;Monitored during session;Premedicated before session    Home Living Family/patient expects to be discharged to:: Private residence Living Arrangements: Spouse/significant other (boyfriend) Available Help at Discharge: Family;Available PRN/intermittently Type of Home: House Home Access: Stairs to enter Entrance Stairs-Rails: None Entrance Stairs-Number of Steps: 2 Home Layout: One level Home Equipment: Walker - 2 wheels;Walker - 4 wheels;Bedside commode;Cane - quad      Prior Function Level of Independence: Independent with assistive device(s)         Comments: Using quad cane     Hand Dominance        Extremity/Trunk Assessment   Upper Extremity Assessment Upper Extremity Assessment: Defer to OT evaluation    Lower Extremity Assessment Lower Extremity Assessment: Generalized weakness;RLE deficits/detail  RLE Deficits / Details: R drop foot (chronic)    Cervical / Trunk Assessment Cervical / Trunk Assessment: Other exceptions Cervical / Trunk Exceptions: excess body habitus  Communication   Communication: No difficulties  Cognition Arousal/Alertness:  Awake/alert Behavior During Therapy: WFL for tasks assessed/performed Overall Cognitive Status: Impaired/Different from baseline Area of Impairment: Orientation;Awareness                 Orientation Level: Disoriented to;Time         Awareness: Emergent   General Comments: Not oriented to year, overall very pleasant and following all commands. Fearful of falling      General Comments      Exercises General Exercises - Lower Extremity Long Arc Quad: Both;10 reps;Seated   Assessment/Plan    PT Assessment Patient needs continued PT services  PT Problem List Decreased strength;Decreased activity tolerance;Decreased balance;Decreased mobility       PT Treatment Interventions DME instruction;Gait training;Functional mobility training;Therapeutic activities;Therapeutic exercise;Balance training;Neuromuscular re-education;Patient/family education    PT Goals (Current goals can be found in the Care Plan section)  Acute Rehab PT Goals Patient Stated Goal: to get stronger, less pain PT Goal Formulation: With patient Time For Goal Achievement: 04/12/20 Potential to Achieve Goals: Good    Frequency Min 3X/week   Barriers to discharge        Co-evaluation PT/OT/SLP Co-Evaluation/Treatment: Yes Reason for Co-Treatment: Complexity of the patient's impairments (multi-system involvement);For patient/therapist safety;To address functional/ADL transfers PT goals addressed during session: Mobility/safety with mobility         AM-PAC PT "6 Clicks" Mobility  Outcome Measure Help needed turning from your back to your side while in a flat bed without using bedrails?: A Little Help needed moving from lying on your back to sitting on the side of a flat bed without using bedrails?: A Lot Help needed moving to and from a bed to a chair (including a wheelchair)?: A Lot Help needed standing up from a chair using your arms (e.g., wheelchair or bedside chair)?: A Lot Help needed to  walk in hospital room?: Total Help needed climbing 3-5 steps with a railing? : Total 6 Click Score: 11    End of Session Equipment Utilized During Treatment: Gait belt Activity Tolerance: Patient tolerated treatment well Patient left: in bed;with call bell/phone within reach;with bed alarm set Nurse Communication: Mobility status PT Visit Diagnosis: Other abnormalities of gait and mobility (R26.89)    Time: [id number] PT Time Calculation (min) (ACUTE ONLY): 40 min   Charges:   PT Evaluation $PT Eval Moderate Complexity: 1 Mod PT Treatments $Therapeutic Activity: 8-22 mins        Wyona Almas, PT, DPT Acute Rehabilitation Services Pager [phone] Office [phone]   Deno Etienne 03/29/2020, 12:05 PM

## 2020-03-29 NOTE — Progress Notes (Signed)
Patient had Dialysis removed from right side of neck 2 days ago.  Patient states CPAP mask bothered her last night in that area.  CPAP withheld at this time.

## 2020-03-29 NOTE — TOC Initial Note (Addendum)
Transition of Care [facility]) - Initial/Assessment Note    Patient Details  Name: [name] MRN: [id number] Date of Birth: 1970-09-28  [facility] CM/SW Contact:    Leone Haven, RN Phone Number: 03/29/2020, 3:12 PM  Clinical Narrative:                 NCM spoke with patient at bedside, this NCM informed her that physical therapy rec short term SNF for her , asked if she was agreeable to this, she states yes.  She states NCM can fax her information out to see what bed offers we get. She states she was in a SNF before in 2003 after her back surgery.   She plans to go back and live with her boyfriend after short term SNF. [facility] number is pending,  [place] must sight is down today. Patient weight is 368.06 pounds. NCM faxed out to [facility].  Expected Discharge Plan: Skilled Nursing Facility Barriers to Discharge: Continued Medical Work up   Patient Goals and CMS Choice Patient states their goals for this hospitalization and ongoing recovery are:: to get better CMS Medicare.gov Compare Post Acute Care list provided to:: Patient Choice offered to / list presented to : Patient  Expected Discharge Plan and Services Expected Discharge Plan: Skilled Nursing Facility   Discharge Planning Services: CM Consult Post Acute Care Choice: Skilled Nursing Facility Living arrangements for the past 2 months: Single Family Home                   DME Agency: NA       HH Arranged: NA          Prior Living Arrangements/Services Living arrangements for the past 2 months: Single Family Home Lives with:: Significant Other Patient language and need for interpreter reviewed:: Yes Do you feel safe going back to the place where you live?: Yes      Need for Family Participation in Patient Care: Yes (Comment) Care giver support system in place?: Yes (comment)   Criminal Activity/Legal Involvement Pertinent to Current Situation/Hospitalization: No - Comment as  needed  Activities of Daily Living   ADL Screening (condition at time of admission) Patient's cognitive ability adequate to safely complete daily activities?: No Is the patient deaf or have difficulty hearing?: No Does the patient have difficulty seeing, even when wearing glasses/contacts?: No Does the patient have difficulty concentrating, remembering, or making decisions?: No Patient able to express need for assistance with ADLs?: Yes Does the patient have difficulty dressing or bathing?: No Independently performs ADLs?: Yes (appropriate for developmental age) Does the patient have difficulty walking or climbing stairs?: Yes Weakness of Legs: Both Weakness of Arms/Hands: None  Permission Sought/Granted                  Emotional Assessment Appearance:: Appears stated age Attitude/Demeanor/Rapport: Engaged Affect (typically observed): Appropriate Orientation: : Oriented to Self,Oriented to Place,Oriented to Situation Alcohol / Substance Use: Not Applicable Psych Involvement: No (comment)  Admission diagnosis:  Acute renal failure (ARF) (HCC) [N17.9] Acute encephalopathy [G93.40] AKI (acute kidney injury) (HCC) [N17.9] Febrile illness [R50.9] Patient Active Problem List   Diagnosis Date Noted  . Acute renal failure (ARF) (HCC) 03/26/2020  . Acute metabolic encephalopathy 03/26/2020  . 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 AKI (acute kidney injury) (HCC) 03/07/2020  . Seizures (HCC) 03/07/2020  . Acute lower UTI 03/07/2020  . Tremor 03/07/2020  . History of 2019 novel coronavirus disease (COVID-19) 03/25/2019  .  Depression with anxiety 03/25/2019  . Chest pain 03/24/2019  . Postoperative abscess 10/27/2017  . Umbilical hernia, incarcerated, s/p repair 10/16/2017 10/16/2017  . Chronic pain of both knees 04/23/2017  . Unilateral primary osteoarthritis, left knee 10/24/2016  . Unilateral primary osteoarthritis, right knee 10/24/2016  . Abnormal mammogram with  microcalcification-Right inner lower quadrant 07/21/2013  . Oliguria and anuria 09/02/2012  . Constipation 03/02/2011  . Lethargy 03/01/2011  . Heel ulcer due to DM ([place]) 02/19/2011  . Wound, open, hip or thigh 02/19/2011  . Healing pressure ulcer stage III ([place]) 02/18/2011  . Morbidly obese ([place]) 02/18/2011  . Diabetes mellitus type 2 with complications, uncontrolled ([place]) 02/18/2011  . Sleep apnea 02/18/2011  . Hypothyroidism 02/18/2011  . Chronic pain 02/18/2011  . Acute lymphangitis 02/18/2011  . Cellulitis of foot 02/18/2011   PCP:  Pecolia Ades, NP Pharmacy:   [place], [place] [address] [place] [address] Phone: [phone] Fax: [phone]     Social Determinants of Health (SDOH) Interventions    Readmission Risk Interventions No flowsheet data found.

## 2020-03-29 NOTE — NC FL2 (Addendum)
[place] LEVEL OF CARE SCREENING TOOL     IDENTIFICATION  Patient Name: [name] Birthdate: 22-Dec-1970 Sex: female Admission Date (Current Location): 03/26/2020  [facility] and [place] Number:  Herbalist and Address:  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   AKI (acute kidney injury) ([place]) 03/07/2020   Seizures ([place]) 03/07/2020   Acute lower UTI 03/07/2020   Tremor 03/07/2020   History of 2019 novel coronavirus disease (COVID-19) 03/25/2019   Depression with anxiety 03/25/2019   Chest pain 03/24/2019   Postoperative abscess [id number]   Umbilical hernia, incarcerated, s/p repair 10/16/2017 10/16/2017   Chronic pain of both knees 04/23/2017   Unilateral primary osteoarthritis, left knee 10/24/2016   Unilateral primary osteoarthritis, right knee 10/24/2016   Abnormal mammogram with microcalcification-Right inner lower quadrant 07/21/2013   Oliguria and anuria 09/02/2012   Constipation 03/02/2011   Lethargy 03/01/2011   Heel ulcer due to DM ([place]) 02/19/2011   Wound, open, hip or thigh 02/19/2011   Healing pressure ulcer stage III ([place]) 02/18/2011   Morbidly obese ([place]) 02/18/2011   Diabetes mellitus type 2 with complications, uncontrolled ([place]) 02/18/2011   Sleep apnea 02/18/2011   Hypothyroidism 02/18/2011   Chronic pain 02/18/2011    Acute lymphangitis 02/18/2011   Cellulitis of foot 02/18/2011    Orientation RESPIRATION BLADDER Height & Weight     Self,Place,Situation  Normal Incontinent,External catheter Weight: (!) 167.3 kg Height:  5\' 5"  (165.1 cm)  BEHAVIORAL SYMPTOMS/MOOD NEUROLOGICAL BOWEL NUTRITION STATUS      Continent Diet (see dc summary)  AMBULATORY STATUS COMMUNICATION OF NEEDS Skin   Extensive Assist Verbally Normal                       Personal Care Assistance Level of Assistance  Bathing,Feeding,Dressing Bathing Assistance: Limited assistance Feeding assistance: Independent Dressing Assistance: Independent     Functional Limitations Info  Sight,Hearing,Speech Sight Info: Adequate Hearing Info: Adequate Speech Info: Adequate    SPECIAL CARE FACTORS FREQUENCY  PT (By licensed PT),OT (By licensed OT)     PT Frequency: 5x/week OT Frequency: 5x/week            Contractures Contractures Info: Not present    Additional Factors Info  Code Status,Allergies Code Status Info: FULL Allergies Info: Penicillins, Clindamycin/lincomycin, Nsaids, Sulfa Antibiotics, Versed (Midazolam)           Current Medications (03/29/2020):  This is the current hospital active medication list Current Facility-Administered Medications  Medication Dose Route Frequency Provider Last Rate Last Admin   0.9 %  sodium chloride infusion   Intra-arterial PRN Maryjane Hurter, MD       acetaminophen (TYLENOL) tablet 650 mg  650 mg Oral Q6H PRN Karmen Bongo, MD   650 mg at 03/28/20 1758   Or   acetaminophen (TYLENOL) suppository 650 mg  650 mg Rectal Q6H PRN Lorin Mercy,  Victorino Dike, MD       albuterol (PROVENTIL) (2.5 MG/3ML) 0.083% nebulizer solution 2.5 mg  2.5 mg Nebulization Q2H PRN Jonah Blue, MD       alteplase (CATHFLO ACTIVASE) injection 2 mg  2 mg Intracatheter Once Lupita Leash, MD       amitriptyline (ELAVIL) tablet 25 mg  25 mg Oral QHS Charlott Holler, MD   25 mg at 03/28/20  2131   atorvastatin (LIPITOR) tablet 20 mg  20 mg Oral Daily Jonah Blue, MD   20 mg at 03/29/20 1134   carisoprodol (SOMA) tablet 350 mg  350 mg Oral BID PRN Edsel Petrin, DO       chlorhexidine (PERIDEX) 0.12 % solution 15 mL  15 mL Mouth Rinse BID Max Fickle B, MD   15 mL at 03/29/20 1137   Chlorhexidine Gluconate Cloth 2 % PADS 6 each  6 each Topical Q0600 Lupita Leash, MD   6 each at 03/28/20 1700   dextrose 50 % solution 0-50 mL  0-50 mL Intravenous PRN Jonah Blue, MD       DULoxetine (CYMBALTA) DR capsule 60 mg  60 mg Oral BID Charlott Holler, MD   60 mg at 03/29/20 0913   eletriptan (RELPAX) tablet 40 mg  40 mg Oral Q2H PRN Charlott Holler, MD       fentaNYL (DURAGESIC) 50 MCG/HR 1 patch  1 patch Transdermal Vickie Epley, MD   1 patch at 03/29/20 1333   gabapentin (NEURONTIN) tablet 300 mg  300 mg Oral TID Edsel Petrin, DO       heparin injection 5,000 Units  5,000 Units Subcutaneous Bobette Mo, MD   5,000 Units at 03/29/20 1333   hydrALAZINE (APRESOLINE) injection 5 mg  5 mg Intravenous Q4H PRN Jonah Blue, MD       insulin aspart (novoLOG) injection 0-20 Units  0-20 Units Subcutaneous TID WC Charlott Holler, MD   3 Units at 03/29/20 1333   insulin aspart (novoLOG) injection 0-5 Units  0-5 Units Subcutaneous QHS Charlott Holler, MD       insulin aspart protamine- aspart (NOVOLOG MIX 70/30) injection 75 Units  75 Units Subcutaneous BID WC Charlott Holler, MD   75 Units at 03/29/20 0914   levETIRAcetam (KEPPRA) 750 mg in sodium chloride 0.9 % 100 mL IVPB  750 mg Intravenous Q12H Caryl Pina, MD 430 mL/hr at 03/29/20 1134 750 mg at 03/29/20 1134   levothyroxine (SYNTHROID) tablet 150 mcg  150 mcg Oral Daily Jonah Blue, MD   150 mcg at 03/29/20 0645   lidocaine (LIDODERM) 5 % 4 patch  4 patch Transdermal Q24H Jonah Blue, MD   4 patch at 03/29/20 0913   MEDLINE mouth rinse  15 mL Mouth Rinse q12n4p Max Fickle  B, MD   15 mL at 03/29/20 1137   ondansetron (ZOFRAN) tablet 4 mg  4 mg Oral Q6H PRN Jonah Blue, MD       Or   ondansetron [facility]) injection 4 mg  4 mg Intravenous Q6H PRN Jonah Blue, MD       oxyCODONE (Oxy IR/ROXICODONE) immediate release tablet 10 mg  10 mg Oral Q4H PRN Edsel Petrin, DO       pantoprazole (PROTONIX) EC tablet 40 mg  40 mg Oral Daily Jonah Blue, MD   40 mg at 03/29/20 0913   polyethylene glycol (MIRALAX / GLYCOLAX) packet 17 g  17 g Oral Daily PRN Jonah Blue, MD  senna (SENOKOT) tablet 8.6 mg  8.6 mg Oral Daily PRN Karmen Bongo, MD       sodium chloride flush (NS) 0.9 % injection 3 mL  3 mL Intravenous Q12H Karmen Bongo, MD   3 mL at 03/29/20 1138     Discharge Medications: Please see discharge summary for a list of discharge medications.  Relevant Imaging Results:  Relevant Lab Results:   Additional Information [phone]  Patient weighs 368.06 pounds  Zenon Mayo, RN

## 2020-03-29 NOTE — Progress Notes (Signed)
PROGRESS NOTE    [name]  OFB:[id number] DOB: 1970/09/20 DOA: 03/26/2020 PCP: Geraldine Contras, NP   Brief Narrative:  HPI on 03/26/2020 by Dr. Ashok Croon is a 50 y.o. female with medical history significant of OSA; PVD; OHS; hypothyroidism; HTN; DM; depression/anxiety; and chronic pain presenting with lethargy, AMS.  She was previously admitted from 12/14-19 with AKI, thought to be due to hyperglycemia from uncontrolled DM (A1c 11.9).  She was also thought to have an acute UTI (no culture done) and myoclonus associated with Neurontin.  The patient returns again with AMS and AKI.  Her boyfriend reports that she has chronic pain syndrome and that he is concerned about how much medication she takes.  She has had perseveration and been altered.  She does not wear CPAP/BIPAP and he is unaware of whether she has episodes of apnea at home.  He reports that she has been like this since last night.  The patient is periodically very somnolent with shallow respirations (?baseline) alternating with periods of being alert and able to answer a few questions but perseverates.  She has myoclonic jerks periodically.  Interim history Patient initially presented with altered mental status and was noted to have acute kidney injury with worsening mental status.  Was then transferred to ICU given change in respiratory pattern as well.  Patient was also noted to have myoclonus and was started on CVVHD given acute kidney injury.  Creatinine has since improved so has mental status.  Assessment & Plan   Acute metabolic/toxic encephalopathy -Suspected to be secondary to gabapentin toxicity in the setting of acute renal failure also with hyperglycemia -Patient was noted to have myoclonus -Neurology as well as nephrology consulted and appreciated -All home medications were held  Acute respiratory insufficiency with hypoxia/history of sleep apnea -patient does not use CPAP at home -She may  benefit from sleep apnea testing as well as CPAP, outpatient pulmonology referral -Currently on room air  Acute kidney injury -CT renal stone study without stones or hydronephrosis -Nephrology consulted and appreciated m-creatinine down to 1.17 today (was 4.89 on admission) -Patient did have 6 hours of CRRT however catheter clotted and was given TPA but without improvement of flows -Given her renal and mental status have improved and no further need for CVVH or HD at this time  Chronic pain/fibromyalgia and depression -Currently on Elavil, cymbalta, fentanyl patch -Will restart gabapentin 300 mg 3 times daily, baclofen at 5 mg twice daily as needed  Hypothyroidism -Continue Synthroid  Diabetes mellitus, type II with HHS -Patient did require insulin drip however has been transitioned to 7030 and insulin sliding scale -Continue CBG monitoring -Continue carb modified diet  Morbid obesity -BMI 61.38 -Patient will need to follow-up with PCP for lifestyle modifications  History of seizure disorder -continue keppra  DVT Prophylaxis  Heparin   Code Status: Full  Family Communication: None at bedside  Disposition Plan:  Status is: Inpatient  Remains inpatient appropriate because:Inpatient level of care appropriate due to severity of illness   Dispo: The patient is from: Home              Anticipated d/c is to: SNF              Anticipated d/c date is: 2 days              Patient currently is not medically stable to d/c.  Consultants PCCM Nephrology Neurology  Procedures  CRRT  Antibiotics  Anti-infectives (From admission, onward)   Start     Dose/Rate Route Frequency Ordered Stop   03/29/20 1500  ceFEPIme (MAXIPIME) 2 g in sodium chloride 0.9 % 100 mL IVPB        2 g 200 mL/hr over 30 Minutes Intravenous Every 8 hours 03/29/20 0937     03/26/20 2200  ceFEPIme (MAXIPIME) 2 g in sodium chloride 0.9 % 100 mL IVPB  Status:  Discontinued        2 g 200 mL/hr over 30  Minutes Intravenous Every 24 hours 03/26/20 0536 03/26/20 1802   03/26/20 2000  ceFEPIme (MAXIPIME) 2 g in sodium chloride 0.9 % 100 mL IVPB  Status:  Discontinued        2 g 200 mL/hr over 30 Minutes Intravenous Every 12 hours 03/26/20 1802 03/29/20 0937   03/26/20 0537  vancomycin variable dose per unstable renal function (pharmacist dosing)  Status:  Discontinued         Does not apply See admin instructions 03/26/20 0537 03/26/20 0847   03/26/20 0130  aztreonam (AZACTAM) 2 g in sodium chloride 0.9 % 100 mL IVPB  Status:  Discontinued        2 g 200 mL/hr over 30 Minutes Intravenous  Once 03/26/20 0117 03/26/20 0127   03/26/20 0130  metroNIDAZOLE (FLAGYL) IVPB 500 mg        500 mg 100 mL/hr over 60 Minutes Intravenous  Once 03/26/20 0117 03/26/20 0339   03/26/20 0130  vancomycin (VANCOCIN) IVPB 1000 mg/200 mL premix  Status:  Discontinued        1,000 mg 200 mL/hr over 60 Minutes Intravenous  Once 03/26/20 0117 03/26/20 0127   03/26/20 0130  ceFEPIme (MAXIPIME) 2 g in sodium chloride 0.9 % 100 mL IVPB        2 g 200 mL/hr over 30 Minutes Intravenous  Once 03/26/20 0127 03/26/20 0220   03/26/20 0130  vancomycin (VANCOREADY) IVPB 2000 mg/400 mL        2,000 mg 200 mL/hr over 120 Minutes Intravenous  Once 03/26/20 0127 03/26/20 6440      Subjective:   [name] seen and examined today.  Patient complains of pain everywhere especially her back.  States that she takes a lot of medications and that her doctor recently discontinued her Xanax abruptly.  She denies chest pain or shortness of breath, abdominal pain, nausea or vomiting, dizziness or headache.  Objective:   Vitals:   03/29/20 0126 03/29/20 0127 03/29/20 0517 03/29/20 1225  BP:  106/68 (!) 106/58 105/67  Pulse:  88 94 89  Resp:  18 18 20   Temp:   98.6 F (37 C) 98.5 F (36.9 C)  TempSrc:  Oral Oral Oral  SpO2:  99% 93% 92%  Weight: (!) 167.3 kg     Height:        Intake/Output Summary (Last 24 hours) at  03/29/2020 1425 Last data filed at 03/29/2020 1420 Gross per 24 hour  Intake 1183.29 ml  Output 2200 ml  Net -1016.71 ml   Filed Weights   03/28/20 0000 03/28/20 0500 03/29/20 0126  Weight: (!) 170.2 kg (!) 168.5 kg (!) 167.3 kg    Exam  General: Well developed, chronically ill-appearing, NAD  HEENT: NCAT, mucous membranes moist.   Cardiovascular: S1 S2 auscultated, RRR  Respiratory: Clear to auscultation bilaterally anteriorly  Abdomen: Soft, morbidly obese, nontender, nondistended, + bowel sounds  Extremities: warm dry without cyanosis clubbing or edema  Neuro: AAOx3, nonfocal  Psych: anxious, however, appropriate mood and affect   Data Reviewed: I have personally reviewed following labs and imaging studies  CBC: Recent Labs  Lab 03/26/20 0130 03/26/20 0824 03/27/20 0126 03/27/20 0149 03/27/20 0408 03/29/20 0155  WBC 14.8*  --  11.5*  --   --  6.3  NEUTROABS 9.2*  --   --   --   --   --   HGB 13.8 12.9 12.7 12.9 13.3 10.4*  HCT 44.0 38.0 38.4 38.0 39.0 31.6*  MCV 97.3  --  95.0  --   --  95.8  PLT 279  --  211  --   --  110*   Basic Metabolic Panel: Recent Labs  Lab 03/26/20 2005 03/27/20 0126 03/27/20 0149 03/27/20 0408 03/27/20 1615 03/28/20 0434 03/29/20 0155  NA 134* 134* 136 136 133* 134* 136  K 5.3* 4.2 4.1 4.0 4.9 4.3 4.7  CL 104 103  --   --  101 104 106  CO2 15* 18*  --   --  19* 21* 23  GLUCOSE 193* 133*  --   --  147* 135* 192*  BUN 44* 47*  --   --  39* 39* 35*  CREATININE 4.71* 4.32*  --   --  2.42* 1.80* 1.17*  CALCIUM 8.0* 8.1*  --   --  7.6* 7.7* 8.2*  MG  --  1.4*  --   --   --  1.7  --   PHOS  --  5.8*  --   --  4.2 3.7 2.8   GFR: Estimated Creatinine Clearance: 90.8 mL/min (A) (by C-G formula based on SCr of 1.17 mg/dL (H)). Liver Function Tests: Recent Labs  Lab 03/26/20 0130 03/27/20 0126 03/27/20 1615 03/28/20 0434 03/29/20 0155  AST 57*  --   --   --   --   ALT 41  --   --   --   --   ALKPHOS 109  --   --   --   --    BILITOT 0.6  --   --   --   --   PROT 6.8  --   --   --   --   ALBUMIN 3.6 3.2* 2.9* 2.6* 2.5*   No results for input(s): LIPASE, AMYLASE in the last 168 hours. No results for input(s): AMMONIA in the last 168 hours. Coagulation Profile: Recent Labs  Lab 03/26/20 0130  INR 1.1   Cardiac Enzymes: No results for input(s): CKTOTAL, CKMB, CKMBINDEX, TROPONINI in the last 168 hours. BNP (last 3 results) No results for input(s): PROBNP in the last 8760 hours. HbA1C: No results for input(s): HGBA1C in the last 72 hours. CBG: Recent Labs  Lab 03/28/20 1707 03/28/20 1931 03/28/20 2157 03/29/20 0651 03/29/20 1223  GLUCAP 157* 207* 189* 204* 145*   Lipid Profile: No results for input(s): CHOL, HDL, LDLCALC, TRIG, CHOLHDL, LDLDIRECT in the last 72 hours. Thyroid Function Tests: No results for input(s): TSH, T4TOTAL, FREET4, T3FREE, THYROIDAB in the last 72 hours. Anemia Panel: No results for input(s): VITAMINB12, FOLATE, FERRITIN, TIBC, IRON, RETICCTPCT in the last 72 hours. Urine analysis:    Component Value Date/Time   COLORURINE AMBER (A) 03/26/2020 0252   APPEARANCEUR HAZY (A) 03/26/2020 0252   LABSPEC 1.027 03/26/2020 0252   PHURINE 5.0 03/26/2020 0252   GLUCOSEU 50 (A) 03/26/2020 0252   HGBUR NEGATIVE 03/26/2020 0252   BILIRUBINUR SMALL (A) 03/26/2020 0252   KETONESUR NEGATIVE 03/26/2020 0252   PROTEINUR  30 (A) 03/26/2020 0252   UROBILINOGEN 0.2 09/05/2012 1141   NITRITE NEGATIVE 03/26/2020 0252   LEUKOCYTESUR NEGATIVE 03/26/2020 0252   Sepsis Labs: @LABRCNTIP (procalcitonin:4,lacticidven:4)  ) Recent Results (from the past 240 hour(s))  Blood Culture (routine x 2)     Status: None (Preliminary result)   Collection Time: 03/26/20  1:20 AM   Specimen: BLOOD  Result Value Ref Range Status   Specimen Description BLOOD SITE NOT SPECIFIED  Final   Special Requests   Final    BOTTLES DRAWN AEROBIC AND ANAEROBIC Blood Culture adequate volume   Culture   Final    NO  GROWTH 3 DAYS Performed at [facility], [address], [place], [place]    Report Status PENDING  Incomplete  Blood Culture (routine x 2)     Status: None (Preliminary result)   Collection Time: 03/26/20  1:30 AM   Specimen: BLOOD  Result Value Ref Range Status   Specimen Description BLOOD SITE NOT SPECIFIED  Final   Special Requests   Final    BOTTLES DRAWN AEROBIC AND ANAEROBIC Blood Culture adequate volume   Culture   Final    NO GROWTH 3 DAYS Performed at [facility], [address], [place], [place]    Report Status PENDING  Incomplete  Urine culture     Status: None   Collection Time: 03/26/20  2:52 AM   Specimen: In/Out Cath Urine  Result Value Ref Range Status   Specimen Description IN/OUT CATH URINE  Final   Special Requests NONE  Final   Culture   Final    NO GROWTH Performed at [facility], [address], [place], [place]    Report Status 03/27/2020 FINAL  Final  Resp Panel by RT-PCR (Flu A&B, Covid) Nasopharyngeal Swab     Status: None   Collection Time: 03/26/20  4:38 AM   Specimen: Nasopharyngeal Swab; Nasopharyngeal(NP) swabs in vial transport medium  Result Value Ref Range Status   SARS Coronavirus 2 by RT PCR NEGATIVE NEGATIVE Final    Comment: (NOTE) SARS-CoV-2 target nucleic acids are NOT DETECTED.  The SARS-CoV-2 RNA is generally detectable in upper respiratory specimens during the acute phase of infection. The lowest concentration of SARS-CoV-2 viral copies this assay can detect is 138 copies/mL. A negative result does not preclude SARS-Cov-2 infection and should not be used as the sole basis for treatment or other patient management decisions. A negative result may occur with  improper specimen collection/handling, submission of specimen other than nasopharyngeal swab, presence of viral mutation(s) within the areas targeted by this assay, and inadequate number of viral copies(<138 copies/mL). A  negative result must be combined with clinical observations, patient history, and epidemiological information. The expected result is Negative.  Fact Sheet for Patients:  05/24/20  Fact Sheet for Healthcare Providers:  BloggerCourse.com  This test is no t yet approved or cleared by the [place] FDA and  has been authorized for detection and/or diagnosis of SARS-CoV-2 by FDA under an Emergency Use Authorization (EUA). This EUA will remain  in effect (meaning this test can be used) for the duration of the COVID-19 declaration under Section 564(b)(1) of the Act, 21 U.S.C.section 360bbb-3(b)(1), unless the authorization is terminated  or revoked sooner.       Influenza A by PCR NEGATIVE NEGATIVE Final   Influenza B by PCR NEGATIVE NEGATIVE Final    Comment: (NOTE) The Xpert Xpress SARS-CoV-2/FLU/RSV plus assay is  intended as an aid in the diagnosis of influenza from Nasopharyngeal swab specimens and should not be used as a sole basis for treatment. Nasal washings and aspirates are unacceptable for Xpert Xpress SARS-CoV-2/FLU/RSV testing.  Fact Sheet for Patients: BloggerCourse.com  Fact Sheet for Healthcare Providers: SeriousBroker.it  This test is not yet approved or cleared by the [place] FDA and has been authorized for detection and/or diagnosis of SARS-CoV-2 by FDA under an Emergency Use Authorization (EUA). This EUA will remain in effect (meaning this test can be used) for the duration of the COVID-19 declaration under Section 564(b)(1) of the Act, 21 U.S.C. section 360bbb-3(b)(1), unless the authorization is terminated or revoked.  Performed at [facility], [address], [place], [place] [address]   MRSA PCR Screening     Status: None   Collection Time: 03/26/20  7:53 PM   Specimen: Nasopharyngeal  Result Value Ref Range Status   MRSA by PCR  NEGATIVE NEGATIVE Final    Comment:        The GeneXpert MRSA Assay (FDA approved for NASAL specimens only), is one component of a comprehensive MRSA colonization surveillance program. It is not intended to diagnose MRSA infection nor to guide or monitor treatment for MRSA infections. Performed at [facility], [address] [place], [place] [address]   Culture, Urine     Status: None   Collection Time: 03/26/20  9:12 PM   Specimen: Urine, Catheterized  Result Value Ref Range Status   Specimen Description URINE, CATHETERIZED  Final   Special Requests NONE  Final   Culture   Final    NO GROWTH Performed at [facility], [address], [place], [place] [address]    Report Status 03/28/2020 FINAL  Final      Radiology Studies: No results found.   Scheduled Meds: . alteplase  2 mg Intracatheter Once  . amitriptyline  25 mg Oral QHS  . atorvastatin  20 mg Oral Daily  . chlorhexidine  15 mL Mouth Rinse BID  . Chlorhexidine Gluconate Cloth  6 each Topical Q0600  . DULoxetine  60 mg Oral BID  . fentaNYL  1 patch Transdermal Q72H  . heparin  5,000 Units Subcutaneous Q8H  . insulin aspart  0-20 Units Subcutaneous TID WC  . insulin aspart  0-5 Units Subcutaneous QHS  . insulin aspart protamine- aspart  75 Units Subcutaneous BID WC  . levothyroxine  150 mcg Oral Daily  . lidocaine  4 patch Transdermal Q24H  . mouth rinse  15 mL Mouth Rinse q12n4p  . pantoprazole  40 mg Oral Daily  . sodium chloride flush  3 mL Intravenous Q12H   Continuous Infusions: . sodium chloride    . ceFEPime (MAXIPIME) IV    . levETIRAcetam 750 mg (03/29/20 1134)     LOS: 3 days   Time Spent in minutes   45 minutes  Raneisha Bress D.O. on 03/29/2020 at 2:25 PM  Between 7am to 7pm - Please see pager noted on [email or web address]  After 7pm go to [email or web address]  And look for the night coverage person covering for me after hours  [facility] Office  [phone]

## 2020-03-29 NOTE — Evaluation (Signed)
Occupational Therapy Evaluation Patient Details Name: [name] MRN: [id number] DOB: October 20, 1970 Today's Date: 03/29/2020    History of Present Illness ILIYAH [name] is a 50 y.o. female with medical history significant of OSA, PVD, OHS, hypothyroidism, HTN, DM;,depression/anxiety, seizures and chronic pain presenting with lethargy, AMS and tremor like movements in setting of AKI and polypharmarcy. Previously admitted 12/14-19 with AKI, thought to be due to hyperglycemia. CT head negative for acute intracranial abnormality.   Clinical Impression   Pt PTA:Pt living with s/o and just remodeled her house, but did not move into it yet. Pt reports independence with ADL and mobility with quad cane. Pt currently mod A+2 for transfers and set-upA to maxA for ADL tasks. Pt able to take a few steps to [facility], but requires R AFO. Pt would benefit from continued OT skilled services for ADL, mobility and safety in SNF setting. OT following acutely.      Follow Up Recommendations  SNF;Supervision/Assistance - 24 hour    Equipment Recommendations  3 in 1 bedside commode    Recommendations for Other Services       Precautions / Restrictions Precautions Precautions: Fall;Other (comment) Precaution Comments: drop foot RLE, wears AFO Restrictions Weight Bearing Restrictions: No      Mobility Bed Mobility Overal bed mobility: Needs Assistance Bed Mobility: Supine to Sit;Sit to Supine     Supine to sit: Mod assist;+2 for physical assistance Sit to supine: Mod assist;+2 for physical assistance   General bed mobility comments: Assist for trunk and BLEs    Transfers Overall transfer level: Needs assistance Equipment used: Rolling walker (2 wheeled) Transfers: Sit to/from Stand Sit to Stand: Mod assist;+2 physical assistance         General transfer comment: ModA +2 for power up initially and cues to take steps to [facility]    Balance Overall balance assessment: Needs  assistance Sitting-balance support: No upper extremity supported;Feet supported Sitting balance-Leahy Scale: Good Sitting balance - Comments: Brushing teeth at EOB without loss of balance   Standing balance support: Bilateral upper extremity supported Standing balance-Leahy Scale: Poor Standing balance comment: reliant on UE support of RW                           ADL either performed or assessed with clinical judgement   ADL Overall ADL's : Needs assistance/impaired Eating/Feeding: Set up;Sitting   Grooming: Set up;Sitting   Upper Body Bathing: Minimal assistance;Sitting   Lower Body Bathing: Maximal assistance;Sitting/lateral leans   Upper Body Dressing : Minimal assistance;Sitting   Lower Body Dressing: Maximal assistance;Sitting/lateral leans   Toilet Transfer: Moderate assistance;+2 for physical assistance;+2 for safety/equipment;Cueing for safety;Cueing for sequencing;RW   Toileting- Clothing Manipulation and Hygiene: Maximal assistance;+2 for physical assistance;+2 for safety/equipment;Sitting/lateral lean;Sit to/from stand       Functional mobility during ADLs: Moderate assistance;+2 for physical assistance;+2 for safety/equipment;Rolling walker;Cueing for safety General ADL Comments: Pt limited by decreased strength, decreased activity tolerance and decreased ability to care for self.     Vision Baseline Vision/History: No visual deficits Patient Visual Report: No change from baseline Vision Assessment?: No apparent visual deficits     Perception     Praxis      Pertinent Vitals/Pain Pain Assessment: Faces Faces Pain Scale: Hurts even more Pain Location: back, R buttocks Pain Descriptors / Indicators: Grimacing;Tightness Pain Intervention(s): Monitored during session     Hand Dominance Right   Extremity/Trunk Assessment Upper Extremity Assessment Upper Extremity Assessment:  Generalized weakness   Lower Extremity Assessment Lower Extremity  Assessment: Generalized weakness RLE Deficits / Details: R drop foot (chronic)   Cervical / Trunk Assessment Cervical / Trunk Assessment: Other exceptions Cervical / Trunk Exceptions: excess body habitus   Communication Communication Communication: No difficulties   Cognition Arousal/Alertness: Awake/alert Behavior During Therapy: WFL for tasks assessed/performed Overall Cognitive Status: Impaired/Different from baseline Area of Impairment: Orientation;Awareness                 Orientation Level: Disoriented to;Time         Awareness: Emergent   General Comments: Not oriented to year, overall very pleasant and following all commands. Fearful of falling   General Comments       Exercises     Shoulder Instructions      Home Living Family/patient expects to be discharged to:: Private residence Living Arrangements: Spouse/significant other (boyfriend) Available Help at Discharge: Family;Available PRN/intermittently Type of Home: House Home Access: Stairs to enter [employer] of Steps: 2 Entrance Stairs-Rails: None Home Layout: One level     Bathroom Shower/Tub: Chief Strategy Officer: Standard     Home Equipment: Environmental consultant - 2 wheels;Walker - 4 wheels;Bedside commode;Cane - quad          Prior Functioning/Environment Level of Independence: Independent with assistive device(s)        Comments: Using quad cane        OT Problem List: Decreased strength;Decreased activity tolerance;Impaired vision/perception;Decreased cognition;Decreased safety awareness;Pain;Increased edema;Cardiopulmonary status limiting activity      OT Treatment/Interventions: Self-care/ADL training;Therapeutic exercise;Neuromuscular education;Energy conservation;DME and/or AE instruction;Therapeutic activities;Balance training;Patient/family education    OT Goals(Current goals can be found in the care plan section) Acute Rehab OT Goals Patient Stated Goal: to  get stronger, less pain OT Goal Formulation: With patient Time For Goal Achievement: 04/12/20 Potential to Achieve Goals: Good ADL Goals Pt Will Perform Grooming: with min guard assist;standing Pt Will Perform Lower Body Dressing: with min assist;sit to/from stand Additional ADL Goal #1: pt will participate in x15 mins of EOB or OOB ADL tasks with 2 seated rest breaks in order to increase activity tolerance  OT Frequency: Min 2X/week   Barriers to D/C:            Co-evaluation PT/OT/SLP Co-Evaluation/Treatment: Yes Reason for Co-Treatment: Complexity of the patient's impairments (multi-system involvement)   OT goals addressed during session: ADL's and self-care      AM-PAC OT "6 Clicks" Daily Activity     Outcome Measure Help from another person eating meals?: A Little Help from another person taking care of personal grooming?: A Little Help from another person toileting, which includes using toliet, bedpan, or urinal?: A Lot Help from another person bathing (including washing, rinsing, drying)?: A Lot Help from another person to put on and taking off regular upper body clothing?: A Little Help from another person to put on and taking off regular lower body clothing?: A Lot 6 Click Score: 15   End of Session Equipment Utilized During Treatment: Gait belt;Rolling walker Nurse Communication: Mobility status  Activity Tolerance: Patient limited by fatigue Patient left: in bed;with call bell/phone within reach;with bed alarm set  OT Visit Diagnosis: Unsteadiness on feet (R26.81);Muscle weakness (generalized) (M62.81);Pain;Other symptoms and signs involving cognitive function                Time: 1111-1155 OT Time Calculation (min): 44 min Charges:  OT General Charges $OT Visit: 1 Visit OT Evaluation $OT Eval Moderate Complexity:  1 Mod  {Tali Coster C, OTR/L Acute Rehabilitation Services Pager: [phone] Office: [phone]   Sandra Tellefsen C 03/29/2020, 6:17 PM

## 2020-03-30 DIAGNOSIS — G894 Chronic pain syndrome: Secondary | ICD-10-CM | POA: Diagnosis not present

## 2020-03-30 DIAGNOSIS — N171 Acute kidney failure with acute cortical necrosis: Secondary | ICD-10-CM | POA: Diagnosis not present

## 2020-03-30 DIAGNOSIS — G253 Myoclonus: Secondary | ICD-10-CM | POA: Diagnosis not present

## 2020-03-30 DIAGNOSIS — E118 Type 2 diabetes mellitus with unspecified complications: Secondary | ICD-10-CM | POA: Diagnosis not present

## 2020-03-30 LAB — GLUCOSE, CAPILLARY
Glucose-Capillary: 108 mg/dL — ABNORMAL HIGH (ref 70–99)
Glucose-Capillary: 109 mg/dL — ABNORMAL HIGH (ref 70–99)
Glucose-Capillary: 196 mg/dL — ABNORMAL HIGH (ref 70–99)
Glucose-Capillary: 215 mg/dL — ABNORMAL HIGH (ref 70–99)

## 2020-03-30 LAB — CBC
HCT: 33.5 % — ABNORMAL LOW (ref 36.0–46.0)
Hemoglobin: 11.1 g/dL — ABNORMAL LOW (ref 12.0–15.0)
MCH: 31.4 pg (ref 26.0–34.0)
MCHC: 33.1 g/dL (ref 30.0–36.0)
MCV: 94.9 fL (ref 80.0–100.0)
Platelets: 148 10*3/uL — ABNORMAL LOW (ref 150–400)
RBC: 3.53 MIL/uL — ABNORMAL LOW (ref 3.87–5.11)
RDW: 13.6 % (ref 11.5–15.5)
WBC: 7 10*3/uL (ref 4.0–10.5)
nRBC: 0 % (ref 0.0–0.2)

## 2020-03-30 LAB — RENAL FUNCTION PANEL
Albumin: 2.5 g/dL — ABNORMAL LOW (ref 3.5–5.0)
Anion gap: 8 (ref 5–15)
BUN: 21 mg/dL — ABNORMAL HIGH (ref 6–20)
CO2: 23 mmol/L (ref 22–32)
Calcium: 8.7 mg/dL — ABNORMAL LOW (ref 8.9–10.3)
Chloride: 105 mmol/L (ref 98–111)
Creatinine, Ser: 0.81 mg/dL (ref 0.44–1.00)
GFR, Estimated: >60 mL/min (ref >60)
Glucose, Bld: 114 mg/dL — ABNORMAL HIGH (ref 70–99)
Phosphorus: 2.3 mg/dL — ABNORMAL LOW (ref 2.5–4.6)
Potassium: 4.7 mmol/L (ref 3.5–5.1)
Sodium: 136 mmol/L (ref 135–145)

## 2020-03-30 LAB — SARS CORONAVIRUS 2 (TAT 6-24 HRS): SARS Coronavirus 2: NEGATIVE

## 2020-03-30 MED ORDER — POLYETHYLENE GLYCOL 3350 17 G PO PACK
17.0000 g | PACK | Freq: Every day | ORAL | Status: DC | PRN
  Filled 2020-03-30: qty 1

## 2020-03-30 MED ORDER — LEVETIRACETAM 750 MG PO TABS
750.0000 mg | ORAL_TABLET | Freq: Two times a day (BID) | ORAL | Status: DC
  Administered 2020-03-30 – 2020-04-03 (×9): 750 mg via ORAL
  Filled 2020-03-30 (×9): qty 1

## 2020-03-30 NOTE — TOC Progression Note (Addendum)
Transition of Care [facility]) - Progression Note    Patient Details  Name: [name] MRN: [id number] Date of Birth: 1971-01-25  Transition of [facility]) CM/SW Contact  Erin Sons, [place] Phone Number: 03/30/2020, 2:19 PM  Clinical Narrative:     [facility] offered in the hub but when contacted, they have no beds available. [facility] is unable to guarantee bed over the weekend or Monday.   Pt has no other offers  Expected Discharge Plan: Skilled Nursing Facility Barriers to Discharge: Continued Medical Work up  Expected Discharge Plan and Services Expected Discharge Plan: Skilled Nursing Facility   Discharge Planning Services: CM Consult Post Acute Care Choice: Skilled Nursing Facility Living arrangements for the past 2 months: Single Family Home                   DME Agency: NA       HH Arranged: NA           Social Determinants of Health (SDOH) Interventions    Readmission Risk Interventions No flowsheet data found.

## 2020-03-30 NOTE — Progress Notes (Signed)
PROGRESS NOTE    [name]  [id number] DOB: [birth year] DOA: 03/26/2020 PCP: Pecolia Ades, NP   Brief Narrative:  HPI on 03/26/2020 by Dr. Massie Bougie is a 49 y.o. female with medical history significant of OSA; PVD; OHS; hypothyroidism; HTN; DM; depression/anxiety; and chronic pain presenting with lethargy, AMS.  She was previously admitted from 12/14-19 with AKI, thought to be due to hyperglycemia from uncontrolled DM (A1c 11.9).  She was also thought to have an acute UTI (no culture done) and myoclonus associated with Neurontin.  The patient returns again with AMS and AKI.  Her boyfriend reports that she has chronic pain syndrome and that he is concerned about how much medication she takes.  She has had perseveration and been altered.  She does not wear CPAP/BIPAP and he is unaware of whether she has episodes of apnea at home.  He reports that she has been like this since last night.  The patient is periodically very somnolent with shallow respirations (?baseline) alternating with periods of being alert and able to answer a few questions but perseverates.  She has myoclonic jerks periodically.  Interim history Patient initially presented with altered mental status and was noted to have acute kidney injury with worsening mental status.  Was then transferred to ICU given change in respiratory pattern as well.  Patient was also noted to have myoclonus and was started on CVVHD given acute kidney injury.  Creatinine has since improved so has mental status.  Assessment & Plan   Acute metabolic/toxic encephalopathy -Suspected to be secondary to gabapentin toxicity in the setting of acute renal failure also with hyperglycemia -Patient was noted to have myoclonus -Neurology as well as nephrology consulted and appreciated -All home medications were held initially- have restarted some at smaller doses and monitoring closely  Acute respiratory insufficiency with  hypoxia/history of sleep apnea -patient does not use CPAP at home -She may benefit from sleep apnea testing as well as CPAP, outpatient pulmonology referral -Currently on room air  Acute kidney injury -CT renal stone study without stones or hydronephrosis -Nephrology consulted and appreciated m-creatinine down to 0.81 today (was 4.89 on admission) -Patient did have 6 hours of CRRT however catheter clotted and was given TPA but without improvement of flows -Given her renal and mental status have improved and no further need for CVVH or HD at this time  Chronic pain/fibromyalgia and depression -Currently on Elavil, cymbalta, fentanyl patch -Restarted gabapentin 300mg  TID.  -Discussed with nephrology, will not resume baclofen given that it is renally excreted  -Restarted Soma 350mg  BID PRN  Hypothyroidism -Continue Synthroid  Diabetes mellitus, type II with HHS -Patient did require insulin drip however has been transitioned to 70/30 and insulin sliding scale -Continue CBG monitoring -Continue carb modified diet  Morbid obesity -BMI 61.38 -Patient will need to follow-up with PCP for lifestyle modifications  History of seizure disorder -continue keppra  DVT Prophylaxis  Heparin   Code Status: Full  Family Communication: None at bedside  Disposition Plan:  Status is: Inpatient  Remains inpatient appropriate because:Inpatient level of care appropriate due to severity of illness   Dispo: The patient is from: Home              Anticipated d/c is to: SNF              Anticipated d/c date is: 1 day              Patient currently is  not medically stable to d/c.  Consultants PCCM Nephrology Neurology  Procedures  CRRT  Antibiotics   Anti-infectives (From admission, onward)   Start     Dose/Rate Route Frequency Ordered Stop   03/29/20 1500  ceFEPIme (MAXIPIME) 2 g in sodium chloride 0.9 % 100 mL IVPB  Status:  Discontinued        2 g 200 mL/hr over 30 Minutes  Intravenous Every 8 hours 03/29/20 0937 03/29/20 1439   03/26/20 2200  ceFEPIme (MAXIPIME) 2 g in sodium chloride 0.9 % 100 mL IVPB  Status:  Discontinued        2 g 200 mL/hr over 30 Minutes Intravenous Every 24 hours 03/26/20 0536 03/26/20 1802   03/26/20 2000  ceFEPIme (MAXIPIME) 2 g in sodium chloride 0.9 % 100 mL IVPB  Status:  Discontinued        2 g 200 mL/hr over 30 Minutes Intravenous Every 12 hours 03/26/20 1802 03/29/20 0937   03/26/20 0537  vancomycin variable dose per unstable renal function (pharmacist dosing)  Status:  Discontinued         Does not apply See admin instructions 03/26/20 0537 03/26/20 0847   03/26/20 0130  aztreonam (AZACTAM) 2 g in sodium chloride 0.9 % 100 mL IVPB  Status:  Discontinued        2 g 200 mL/hr over 30 Minutes Intravenous  Once 03/26/20 0117 03/26/20 0127   03/26/20 0130  metroNIDAZOLE (FLAGYL) IVPB 500 mg        500 mg 100 mL/hr over 60 Minutes Intravenous  Once 03/26/20 0117 03/26/20 0339   03/26/20 0130  vancomycin (VANCOCIN) IVPB 1000 mg/200 mL premix  Status:  Discontinued        1,000 mg 200 mL/hr over 60 Minutes Intravenous  Once 03/26/20 0117 03/26/20 0127   03/26/20 0130  ceFEPIme (MAXIPIME) 2 g in sodium chloride 0.9 % 100 mL IVPB        2 g 200 mL/hr over 30 Minutes Intravenous  Once 03/26/20 0127 03/26/20 0220   03/26/20 0130  vancomycin (VANCOREADY) IVPB 2000 mg/400 mL        2,000 mg 200 mL/hr over 120 Minutes Intravenous  Once 03/26/20 0127 03/26/20 [id number]      Subjective:   [name] seen and examined today.  Patient feels mildly better today but continues to have pain everywhere especially in her back.  She is concerned about taking baclofen again.  She currently denies chest pain or shortness of breath, abdominal pain, nausea or vomiting, diarrhea or constipation, dizziness or headache.    Objective:   Vitals:   03/29/20 1225 03/29/20 1933 03/30/20 0421 03/30/20 0849  BP: 105/67 100/61 102/66 (!) 95/53  Pulse: 89  86 81 89  Resp: 20 18 18 19   Temp: 98.5 F (36.9 C) 98.2 F (36.8 C) 98 F (36.7 C) 98.9 F (37.2 C)  TempSrc: Oral Oral Oral Oral  SpO2: 92% 95% 97% 96%  Weight:   (!) 165.7 kg   Height:        Intake/Output Summary (Last 24 hours) at 03/30/2020 1158 Last data filed at 03/30/2020 1032 Gross per 24 hour  Intake 800 ml  Output 2100 ml  Net -1300 ml   Filed Weights   03/28/20 0500 03/29/20 0126 03/30/20 0421  Weight: (!) 168.5 kg (!) 167.3 kg (!) 165.7 kg   Exam  General: Well developed, chronically ill-appearing, NAD  HEENT: NCAT, mucous membranes moist.   Cardiovascular: S1 S2 auscultated, RRR  Respiratory: Clear to auscultation bilaterally, anteriorly  Abdomen: Soft, obese, nontender, nondistended, + bowel sounds  Extremities: warm dry without cyanosis clubbing or edema  Neuro: AAOx3, nonfocal  Psych: appropriate mood and affect  Data Reviewed: I have personally reviewed following labs and imaging studies  CBC: Recent Labs  Lab 03/26/20 0130 03/26/20 0824 03/27/20 0126 03/27/20 0149 03/27/20 0408 03/29/20 0155 03/30/20 0327  WBC 14.8*  --  11.5*  --   --  6.3 7.0  NEUTROABS 9.2*  --   --   --   --   --   --   HGB 13.8   < > 12.7 12.9 13.3 10.4* 11.1*  HCT 44.0   < > 38.4 38.0 39.0 31.6* 33.5*  MCV 97.3  --  95.0  --   --  95.8 94.9  PLT 279  --  211  --   --  110* 148*   < > = values in this interval not displayed.   Basic Metabolic Panel: Recent Labs  Lab 03/27/20 0126 03/27/20 0149 03/27/20 0408 03/27/20 1615 03/28/20 0434 03/29/20 0155 03/30/20 0327  NA 134*   < > 136 133* 134* 136 136  K 4.2   < > 4.0 4.9 4.3 4.7 4.7  CL 103  --   --  101 104 106 105  CO2 18*  --   --  19* 21* 23 23  GLUCOSE 133*  --   --  147* 135* 192* 114*  BUN 47*  --   --  39* 39* 35* 21*  CREATININE 4.32*  --   --  2.42* 1.80* 1.17* 0.81  CALCIUM 8.1*  --   --  7.6* 7.7* 8.2* 8.7*  MG 1.4*  --   --   --  1.7  --   --   PHOS 5.8*  --   --  4.2 3.7 2.8 2.3*   < >  = values in this interval not displayed.   GFR: Estimated Creatinine Clearance: 130.4 mL/min (by C-G formula based on SCr of 0.81 mg/dL). Liver Function Tests: Recent Labs  Lab 03/26/20 0130 03/27/20 0126 03/27/20 1615 03/28/20 0434 03/29/20 0155 03/30/20 0327  AST 57*  --   --   --   --   --   ALT 41  --   --   --   --   --   ALKPHOS 109  --   --   --   --   --   BILITOT 0.6  --   --   --   --   --   PROT 6.8  --   --   --   --   --   ALBUMIN 3.6 3.2* 2.9* 2.6* 2.5* 2.5*   No results for input(s): LIPASE, AMYLASE in the last 168 hours. No results for input(s): AMMONIA in the last 168 hours. Coagulation Profile: Recent Labs  Lab 03/26/20 0130  INR 1.1   Cardiac Enzymes: No results for input(s): CKTOTAL, CKMB, CKMBINDEX, TROPONINI in the last 168 hours. BNP (last 3 results) No results for input(s): PROBNP in the last 8760 hours. HbA1C: No results for input(s): HGBA1C in the last 72 hours. CBG: Recent Labs  Lab 03/29/20 0651 03/29/20 1223 03/29/20 1659 03/29/20 2129 03/30/20 0613  GLUCAP 204* 145* 145* 93 108*   Lipid Profile: No results for input(s): CHOL, HDL, LDLCALC, TRIG, CHOLHDL, LDLDIRECT in the last 72 hours. Thyroid Function Tests: No results for input(s): TSH, T4TOTAL, FREET4, T3FREE, THYROIDAB in  the last 72 hours. Anemia Panel: No results for input(s): VITAMINB12, FOLATE, FERRITIN, TIBC, IRON, RETICCTPCT in the last 72 hours. Urine analysis:    Component Value Date/Time   COLORURINE AMBER (A) 03/26/2020 0252   APPEARANCEUR HAZY (A) 03/26/2020 0252   LABSPEC 1.027 03/26/2020 0252   PHURINE 5.0 03/26/2020 0252   GLUCOSEU 50 (A) 03/26/2020 0252   HGBUR NEGATIVE 03/26/2020 0252   BILIRUBINUR SMALL (A) 03/26/2020 0252   KETONESUR NEGATIVE 03/26/2020 0252   PROTEINUR 30 (A) 03/26/2020 0252   UROBILINOGEN 0.2 09/05/2012 1141   NITRITE NEGATIVE 03/26/2020 0252   LEUKOCYTESUR NEGATIVE 03/26/2020 0252   Sepsis  Labs: @LABRCNTIP (procalcitonin:4,lacticidven:4)  ) Recent Results (from the past 240 hour(s))  Blood Culture (routine x 2)     Status: None (Preliminary result)   Collection Time: 03/26/20  1:20 AM   Specimen: BLOOD  Result Value Ref Range Status   Specimen Description BLOOD SITE NOT SPECIFIED  Final   Special Requests   Final    BOTTLES DRAWN AEROBIC AND ANAEROBIC Blood Culture adequate volume   Culture   Final    NO GROWTH 4 DAYS Performed at [facility], [address], [place], [place]    Report Status PENDING  Incomplete  Blood Culture (routine x 2)     Status: None (Preliminary result)   Collection Time: 03/26/20  1:30 AM   Specimen: BLOOD  Result Value Ref Range Status   Specimen Description BLOOD SITE NOT SPECIFIED  Final   Special Requests   Final    BOTTLES DRAWN AEROBIC AND ANAEROBIC Blood Culture adequate volume   Culture   Final    NO GROWTH 4 DAYS Performed at [facility], [address] [place], [place]    Report Status PENDING  Incomplete  Urine culture     Status: None   Collection Time: 03/26/20  2:52 AM   Specimen: In/Out Cath Urine  Result Value Ref Range Status   Specimen Description IN/OUT CATH URINE  Final   Special Requests NONE  Final   Culture   Final    NO GROWTH Performed at [facility], [address], [place], [place]    Report Status 03/27/2020 FINAL  Final  Resp Panel by RT-PCR (Flu A&B, Covid) Nasopharyngeal Swab     Status: None   Collection Time: 03/26/20  4:38 AM   Specimen: Nasopharyngeal Swab; Nasopharyngeal(NP) swabs in vial transport medium  Result Value Ref Range Status   SARS Coronavirus 2 by RT PCR NEGATIVE NEGATIVE Final    Comment: (NOTE) SARS-CoV-2 target nucleic acids are NOT DETECTED.  The SARS-CoV-2 RNA is generally detectable in upper respiratory specimens during the acute phase of infection. The lowest concentration of SARS-CoV-2 viral copies this assay can detect  is 138 copies/mL. A negative result does not preclude SARS-Cov-2 infection and should not be used as the sole basis for treatment or other patient management decisions. A negative result may occur with  improper specimen collection/handling, submission of specimen other than nasopharyngeal swab, presence of viral mutation(s) within the areas targeted by this assay, and inadequate number of viral copies(<138 copies/mL). A negative result must be combined with clinical observations, patient history, and epidemiological information. The expected result is Negative.  Fact Sheet for Patients:  05/24/20  Fact Sheet for Healthcare Providers:  BloggerCourse.com  This test is no t yet approved or cleared by the [employer] FDA and  has been authorized for detection  and/or diagnosis of SARS-CoV-2 by FDA under an Emergency Use Authorization (EUA). This EUA will remain  in effect (meaning this test can be used) for the duration of the COVID-19 declaration under Section 564(b)(1) of the Act, 21 U.S.C.section 360bbb-3(b)(1), unless the authorization is terminated  or revoked sooner.       Influenza A by PCR NEGATIVE NEGATIVE Final   Influenza B by PCR NEGATIVE NEGATIVE Final    Comment: (NOTE) The Xpert Xpress SARS-CoV-2/FLU/RSV plus assay is intended as an aid in the diagnosis of influenza from Nasopharyngeal swab specimens and should not be used as a sole basis for treatment. Nasal washings and aspirates are unacceptable for Xpert Xpress SARS-CoV-2/FLU/RSV testing.  Fact Sheet for Patients: EntrepreneurPulse.com.au  Fact Sheet for Healthcare Providers: IncredibleEmployment.be  This test is not yet approved or cleared by the [place] FDA and has been authorized for detection and/or diagnosis of SARS-CoV-2 by FDA under an Emergency Use Authorization (EUA). This EUA will remain in effect  (meaning this test can be used) for the duration of the COVID-19 declaration under Section 564(b)(1) of the Act, 21 U.S.C. section 360bbb-3(b)(1), unless the authorization is terminated or revoked.  Performed at [facility], [place] [address] [place], [place] [address]   MRSA PCR Screening     Status: None   Collection Time: 03/26/20  7:53 PM   Specimen: Nasopharyngeal  Result Value Ref Range Status   MRSA by PCR NEGATIVE NEGATIVE Final    Comment:        The GeneXpert MRSA Assay (FDA approved for NASAL specimens only), is one component of a comprehensive MRSA colonization surveillance program. It is not intended to diagnose MRSA infection nor to guide or monitor treatment for MRSA infections. Performed at [facility], [place] [address] [place], [place] [address]   Culture, Urine     Status: None   Collection Time: 03/26/20  9:12 PM   Specimen: Urine, Catheterized  Result Value Ref Range Status   Specimen Description URINE, CATHETERIZED  Final   Special Requests NONE  Final   Culture   Final    NO GROWTH Performed at [facility], [address] [place], [place] [address]    Report Status 03/28/2020 FINAL  Final      Radiology Studies: No results found.   Scheduled Meds: . alteplase  2 mg Intracatheter Once  . amitriptyline  25 mg Oral QHS  . atorvastatin  20 mg Oral Daily  . chlorhexidine  15 mL Mouth Rinse BID  . Chlorhexidine Gluconate Cloth  6 each Topical Q0600  . DULoxetine  60 mg Oral BID  . fentaNYL  1 patch Transdermal Q72H  . gabapentin  300 mg Oral TID  . heparin  5,000 Units Subcutaneous Q8H  . insulin aspart  0-20 Units Subcutaneous TID WC  . insulin aspart  0-5 Units Subcutaneous QHS  . insulin aspart protamine- aspart  75 Units Subcutaneous BID WC  . levETIRAcetam  750 mg Oral BID  . levothyroxine  150 mcg Oral Daily  . lidocaine  4 patch Transdermal Q24H  . mouth rinse  15 mL Mouth Rinse q12n4p  . pantoprazole  40 mg Oral  Daily  . sodium chloride flush  3 mL Intravenous Q12H   Continuous Infusions: . sodium chloride       LOS: 4 days   Time Spent in minutes   45 minutes  Anushri Casalino D.O. on 03/30/2020 at 11:58 AM  Between 7am  to 7pm - Please see pager noted on [email or web address]  After 7pm go to [email or web address]  And look for the night coverage person covering for me after hours  [facility] Office  [phone]

## 2020-03-30 NOTE — Plan of Care (Signed)
  Problem: Clinical Measurements: Goal: Respiratory complications will improve Outcome: Progressing   Problem: Activity: Goal: Risk for activity intolerance will decrease Outcome: Progressing   Problem: Coping: Goal: Level of anxiety will decrease Outcome: Progressing   Problem: Pain Managment: Goal: General experience of comfort will improve Outcome: Progressing   Problem: Safety: Goal: Ability to remain free from injury will improve Outcome: Progressing   

## 2020-03-31 DIAGNOSIS — E118 Type 2 diabetes mellitus with unspecified complications: Secondary | ICD-10-CM | POA: Diagnosis not present

## 2020-03-31 DIAGNOSIS — G253 Myoclonus: Secondary | ICD-10-CM | POA: Diagnosis not present

## 2020-03-31 DIAGNOSIS — G894 Chronic pain syndrome: Secondary | ICD-10-CM | POA: Diagnosis not present

## 2020-03-31 DIAGNOSIS — N171 Acute kidney failure with acute cortical necrosis: Secondary | ICD-10-CM | POA: Diagnosis not present

## 2020-03-31 LAB — RENAL FUNCTION PANEL
Albumin: 2.5 g/dL — ABNORMAL LOW (ref 3.5–5.0)
Anion gap: 10 (ref 5–15)
BUN: 18 mg/dL (ref 6–20)
CO2: 24 mmol/L (ref 22–32)
Calcium: 8.9 mg/dL (ref 8.9–10.3)
Chloride: 102 mmol/L (ref 98–111)
Creatinine, Ser: 0.79 mg/dL (ref 0.44–1.00)
GFR, Estimated: >60 mL/min (ref >60)
Glucose, Bld: 240 mg/dL — ABNORMAL HIGH (ref 70–99)
Phosphorus: 2.4 mg/dL — ABNORMAL LOW (ref 2.5–4.6)
Potassium: 4.5 mmol/L (ref 3.5–5.1)
Sodium: 136 mmol/L (ref 135–145)

## 2020-03-31 LAB — GLUCOSE, CAPILLARY
Glucose-Capillary: 381 mg/dL — ABNORMAL HIGH (ref 70–99)
Glucose-Capillary: 193 mg/dL — ABNORMAL HIGH (ref 70–99)
Glucose-Capillary: 234 mg/dL — ABNORMAL HIGH (ref 70–99)
Glucose-Capillary: 251 mg/dL — ABNORMAL HIGH (ref 70–99)

## 2020-03-31 NOTE — TOC Progression Note (Signed)
Transition of Care [facility]) - Progression Note    Patient Details  Name: [name] MRN: [id number] Date of Birth: 1970/11/18  Transition of [facility] CM/SW [place], [place] Phone Number: 03/31/2020, 3:06 PM  Clinical Narrative:     CSW met with pt to discuss SNF choice. CSW explained that [facility] accepted her on hub but that currently has no beds. Pt is agreeable to go to [place] if they have bed available. Otherwise, pt is agreeable to expanding bed search to [place]. CSW expanded bed search.   Expected Discharge Plan: [place] Barriers to Discharge: Continued Medical Work up  Expected Discharge Plan and Services Expected Discharge Plan: [place]   Discharge Planning Services: CM Consult Post Acute Care Choice: [place] Living arrangements for the past 2 months: Single Family Home                   DME Agency: NA       HH Arranged: NA           Social Determinants of Health (SDOH) Interventions    Readmission Risk Interventions No flowsheet data found.

## 2020-03-31 NOTE — Plan of Care (Signed)
  Problem: Activity: Goal: Risk for activity intolerance will decrease Outcome: Progressing   Problem: Nutrition: Goal: Adequate nutrition will be maintained Outcome: Progressing   Problem: Coping: Goal: Level of anxiety will decrease Outcome: Progressing   Problem: Pain Managment: Goal: General experience of comfort will improve Outcome: Progressing   

## 2020-03-31 NOTE — Progress Notes (Signed)
Asked pt about bipap/cpap states she is sleeping good with out it.

## 2020-03-31 NOTE — Progress Notes (Signed)
PROGRESS NOTE    RONNIESHA SEIBOLD  VVO:[id number] DOB: Mar 11, 1971 DOA: 03/26/2020 PCP: Pecolia Ades, NP   Brief Narrative:  HPI on 03/26/2020 by Dr. Massie Bougie is a 50 y.o. female with medical history significant of OSA; PVD; OHS; hypothyroidism; HTN; DM; depression/anxiety; and chronic pain presenting with lethargy, AMS.  She was previously admitted from 12/14-19 with AKI, thought to be due to hyperglycemia from uncontrolled DM (A1c 11.9).  She was also thought to have an acute UTI (no culture done) and myoclonus associated with Neurontin.  The patient returns again with AMS and AKI.  Her boyfriend reports that she has chronic pain syndrome and that he is concerned about how much medication she takes.  She has had perseveration and been altered.  She does not wear CPAP/BIPAP and he is unaware of whether she has episodes of apnea at home.  He reports that she has been like this since last night.  The patient is periodically very somnolent with shallow respirations (?baseline) alternating with periods of being alert and able to answer a few questions but perseverates.  She has myoclonic jerks periodically.  Interim history Patient initially presented with altered mental status and was noted to have acute kidney injury with worsening mental status.  Was then transferred to ICU given change in respiratory pattern as well.  Patient was also noted to have myoclonus and was started on CVVHD given acute kidney injury.  Creatinine has since improved so has mental status.  Assessment & Plan   Acute metabolic/toxic encephalopathy -Suspected to be secondary to gabapentin toxicity in the setting of acute renal failure also with hyperglycemia -Patient was noted to have myoclonus -Neurology as well as nephrology consulted and appreciated -All home medications were held initially- have restarted some at smaller doses and monitoring closely -mental status currently stable,  AAOx3  Acute respiratory insufficiency with hypoxia/history of sleep apnea -patient does not use CPAP at home -She may benefit from sleep apnea testing as well as CPAP, outpatient pulmonology referral -Currently on room air  Acute kidney injury -CT renal stone study without stones or hydronephrosis -Nephrology consulted and appreciated m-creatinine down to 0.79 today (was 4.89 on admission) -Patient did have 6 hours of CRRT however catheter clotted and was given TPA but without improvement of flows -Given her renal and mental status have improved and no further need for CVVH or HD at this time  Chronic pain/fibromyalgia and depression -Currently on Elavil, cymbalta, fentanyl patch -Continue gabapentin 300mg  TID, Soma 350mg  BID PRN -Discussed with nephrology, will not resume baclofen given that it is renally excreted   Hypothyroidism -Continue Synthroid  Diabetes mellitus, type II with HHS -Patient did require insulin drip however has been transitioned to 70/30 and insulin sliding scale -Continue CBG monitoring -Continue carb modified diet  Morbid obesity -BMI 61.38 -Patient will need to follow-up with PCP for lifestyle modifications  History of seizure disorder -continue keppra  DVT Prophylaxis  Heparin   Code Status: Full  Family Communication: None at bedside  Disposition Plan:  Status is: Inpatient  Remains inpatient appropriate because:Inpatient level of care appropriate due to severity of illness. Pending SNF.    Dispo: The patient is from: Home              Anticipated d/c is to: SNF              Anticipated d/c date is: 1 day  Patient currently is not medically stable to d/c.  Consultants PCCM Nephrology Neurology  Procedures  CRRT  Antibiotics   Anti-infectives (From admission, onward)   Start     Dose/Rate Route Frequency Ordered Stop   03/29/20 1500  ceFEPIme (MAXIPIME) 2 g in sodium chloride 0.9 % 100 mL IVPB  Status:  Discontinued         2 g 200 mL/hr over 30 Minutes Intravenous Every 8 hours 03/29/20 0937 03/29/20 1439   03/26/20 2200  ceFEPIme (MAXIPIME) 2 g in sodium chloride 0.9 % 100 mL IVPB  Status:  Discontinued        2 g 200 mL/hr over 30 Minutes Intravenous Every 24 hours 03/26/20 0536 03/26/20 1802   03/26/20 2000  ceFEPIme (MAXIPIME) 2 g in sodium chloride 0.9 % 100 mL IVPB  Status:  Discontinued        2 g 200 mL/hr over 30 Minutes Intravenous Every 12 hours 03/26/20 1802 03/29/20 0937   03/26/20 0537  vancomycin variable dose per unstable renal function (pharmacist dosing)  Status:  Discontinued         Does not apply See admin instructions 03/26/20 0537 03/26/20 0847   03/26/20 0130  aztreonam (AZACTAM) 2 g in sodium chloride 0.9 % 100 mL IVPB  Status:  Discontinued        2 g 200 mL/hr over 30 Minutes Intravenous  Once 03/26/20 0117 03/26/20 0127   03/26/20 0130  metroNIDAZOLE (FLAGYL) IVPB 500 mg        500 mg 100 mL/hr over 60 Minutes Intravenous  Once 03/26/20 0117 03/26/20 0339   03/26/20 0130  vancomycin (VANCOCIN) IVPB 1000 mg/200 mL premix  Status:  Discontinued        1,000 mg 200 mL/hr over 60 Minutes Intravenous  Once 03/26/20 0117 03/26/20 0127   03/26/20 0130  ceFEPIme (MAXIPIME) 2 g in sodium chloride 0.9 % 100 mL IVPB        2 g 200 mL/hr over 30 Minutes Intravenous  Once 03/26/20 0127 03/26/20 0220   03/26/20 0130  vancomycin (VANCOREADY) IVPB 2000 mg/400 mL        2,000 mg 200 mL/hr over 120 Minutes Intravenous  Once 03/26/20 0127 03/26/20 5284      Subjective:   Allyah Heather seen and examined today.  Patient states she is feeling better today.  Continues to have pain.  Denies current chest pain or shortness of breath, abdominal pain, nausea or vomiting, diarrhea or constipation, dizziness or headache.    Objective:   Vitals:   03/30/20 2026 03/31/20 0525 03/31/20 1003 03/31/20 1224  BP: (!) 105/52 116/63 (!) 141/56 108/62  Pulse: 83 85  88  Resp: 18 20  20   Temp: 98.8  F (37.1 C) 98.4 F (36.9 C)  98 F (36.7 C)  TempSrc: Oral Oral  Oral  SpO2: 98% 99%  97%  Weight:  (!) 165.1 kg    Height:        Intake/Output Summary (Last 24 hours) at 03/31/2020 1307 Last data filed at 03/31/2020 1250 Gross per 24 hour  Intake 900 ml  Output 1250 ml  Net -350 ml   Filed Weights   03/29/20 0126 03/30/20 0421 03/31/20 0525  Weight: (!) 167.3 kg (!) 165.7 kg (!) 165.1 kg   Exam  General: Well developed, chronically ill-appearing, NAD  HEENT: NCAT, mucous membranes moist.   Cardiovascular: S1 S2 auscultated, RRR  Respiratory: Clear to auscultation bilaterally anteriorly   Abdomen: Soft, nontender,  nondistended, + bowel sounds  Extremities: warm dry without cyanosis clubbing or edema  Neuro: AAOx3, nonfocal  Psych: Appropriate mood and affect  Data Reviewed: I have personally reviewed following labs and imaging studies  CBC: Recent Labs  Lab 03/26/20 0130 03/26/20 0824 03/27/20 0126 03/27/20 0149 03/27/20 0408 03/29/20 0155 03/30/20 0327  WBC 14.8*  --  11.5*  --   --  6.3 7.0  NEUTROABS 9.2*  --   --   --   --   --   --   HGB 13.8   < > 12.7 12.9 13.3 10.4* 11.1*  HCT 44.0   < > 38.4 38.0 39.0 31.6* 33.5*  MCV 97.3  --  95.0  --   --  95.8 94.9  PLT 279  --  211  --   --  110* 148*   < > = values in this interval not displayed.   Basic Metabolic Panel: Recent Labs  Lab 03/27/20 0126 03/27/20 0149 03/27/20 1615 03/28/20 0434 03/29/20 0155 03/30/20 0327 03/31/20 0124  NA 134*   < > 133* 134* 136 136 136  K 4.2   < > 4.9 4.3 4.7 4.7 4.5  CL 103  --  101 104 106 105 102  CO2 18*  --  19* 21* 23 23 24   GLUCOSE 133*  --  147* 135* 192* 114* 240*  BUN 47*  --  39* 39* 35* 21* 18  CREATININE 4.32*  --  2.42* 1.80* 1.17* 0.81 0.79  CALCIUM 8.1*  --  7.6* 7.7* 8.2* 8.7* 8.9  MG 1.4*  --   --  1.7  --   --   --   PHOS 5.8*  --  4.2 3.7 2.8 2.3* 2.4*   < > = values in this interval not displayed.   GFR: Estimated Creatinine  Clearance: 131.6 mL/min (by C-G formula based on SCr of 0.79 mg/dL). Liver Function Tests: Recent Labs  Lab 03/26/20 0130 03/27/20 0126 03/27/20 1615 03/28/20 0434 03/29/20 0155 03/30/20 0327 03/31/20 0124  AST 57*  --   --   --   --   --   --   ALT 41  --   --   --   --   --   --   ALKPHOS 109  --   --   --   --   --   --   BILITOT 0.6  --   --   --   --   --   --   PROT 6.8  --   --   --   --   --   --   ALBUMIN 3.6   < > 2.9* 2.6* 2.5* 2.5* 2.5*   < > = values in this interval not displayed.   No results for input(s): LIPASE, AMYLASE in the last 168 hours. No results for input(s): AMMONIA in the last 168 hours. Coagulation Profile: Recent Labs  Lab 03/26/20 0130  INR 1.1   Cardiac Enzymes: No results for input(s): CKTOTAL, CKMB, CKMBINDEX, TROPONINI in the last 168 hours. BNP (last 3 results) No results for input(s): PROBNP in the last 8760 hours. HbA1C: No results for input(s): HGBA1C in the last 72 hours. CBG: Recent Labs  Lab 03/30/20 0613 03/30/20 1235 03/30/20 1607 03/30/20 2118 03/31/20 0616  GLUCAP 108* 109* 196* 215* 381*   Lipid Profile: No results for input(s): CHOL, HDL, LDLCALC, TRIG, CHOLHDL, LDLDIRECT in the last 72 hours. Thyroid Function Tests: No results for  input(s): TSH, T4TOTAL, FREET4, T3FREE, THYROIDAB in the last 72 hours. Anemia Panel: No results for input(s): VITAMINB12, FOLATE, FERRITIN, TIBC, IRON, RETICCTPCT in the last 72 hours. Urine analysis:    Component Value Date/Time   COLORURINE AMBER (A) 03/26/2020 0252   APPEARANCEUR HAZY (A) 03/26/2020 0252   LABSPEC 1.027 03/26/2020 0252   PHURINE 5.0 03/26/2020 0252   GLUCOSEU 50 (A) 03/26/2020 0252   HGBUR NEGATIVE 03/26/2020 0252   BILIRUBINUR SMALL (A) 03/26/2020 0252   KETONESUR NEGATIVE 03/26/2020 0252   PROTEINUR 30 (A) 03/26/2020 0252   UROBILINOGEN 0.2 09/05/2012 1141   NITRITE NEGATIVE 03/26/2020 0252   LEUKOCYTESUR NEGATIVE 03/26/2020 0252   Sepsis  Labs: @LABRCNTIP (procalcitonin:4,lacticidven:4)  ) Recent Results (from the past 240 hour(s))  Blood Culture (routine x 2)     Status: None (Preliminary result)   Collection Time: 03/26/20  1:20 AM   Specimen: BLOOD  Result Value Ref Range Status   Specimen Description BLOOD SITE NOT SPECIFIED  Final   Special Requests   Final    BOTTLES DRAWN AEROBIC AND ANAEROBIC Blood Culture adequate volume   Culture   Final    NO GROWTH 4 DAYS Performed at [facility], [address], [place]    Report Status PENDING  Incomplete  Blood Culture (routine x 2)     Status: None (Preliminary result)   Collection Time: 03/26/20  1:30 AM   Specimen: BLOOD  Result Value Ref Range Status   Specimen Description BLOOD SITE NOT SPECIFIED  Final   Special Requests   Final    BOTTLES DRAWN AEROBIC AND ANAEROBIC Blood Culture adequate volume   Culture   Final    NO GROWTH 4 DAYS Performed at [facility], [address], [place], [place]    Report Status PENDING  Incomplete  Urine culture     Status: None   Collection Time: 03/26/20  2:52 AM   Specimen: In/Out Cath Urine  Result Value Ref Range Status   Specimen Description IN/OUT CATH URINE  Final   Special Requests NONE  Final   Culture   Final    NO GROWTH Performed at [facility], [address], [place], [place]    Report Status 03/27/2020 FINAL  Final  Resp Panel by RT-PCR (Flu A&B, Covid) Nasopharyngeal Swab     Status: None   Collection Time: 03/26/20  4:38 AM   Specimen: Nasopharyngeal Swab; Nasopharyngeal(NP) swabs in vial transport medium  Result Value Ref Range Status   SARS Coronavirus 2 by RT PCR NEGATIVE NEGATIVE Final    Comment: (NOTE) SARS-CoV-2 target nucleic acids are NOT DETECTED.  The SARS-CoV-2 RNA is generally detectable in upper respiratory specimens during the acute phase of infection. The lowest concentration of SARS-CoV-2 viral copies this assay can detect  is 138 copies/mL. A negative result does not preclude SARS-Cov-2 infection and should not be used as the sole basis for treatment or other patient management decisions. A negative result may occur with  improper specimen collection/handling, submission of specimen other than nasopharyngeal swab, presence of viral mutation(s) within the areas targeted by this assay, and inadequate number of viral copies(<138 copies/mL). A negative result must be combined with clinical observations, patient history, and epidemiological information. The expected result is Negative.  Fact Sheet for Patients:  05/24/20  Fact Sheet for Healthcare Providers:  BloggerCourse.com  This test is no t yet approved or cleared by the [place] FDA  and  has been authorized for detection and/or diagnosis of SARS-CoV-2 by FDA under an Emergency Use Authorization (EUA). This EUA will remain  in effect (meaning this test can be used) for the duration of the COVID-19 declaration under Section 564(b)(1) of the Act, 21 U.S.C.section 360bbb-3(b)(1), unless the authorization is terminated  or revoked sooner.       Influenza A by PCR NEGATIVE NEGATIVE Final   Influenza B by PCR NEGATIVE NEGATIVE Final    Comment: (NOTE) The Xpert Xpress SARS-CoV-2/FLU/RSV plus assay is intended as an aid in the diagnosis of influenza from Nasopharyngeal swab specimens and should not be used as a sole basis for treatment. Nasal washings and aspirates are unacceptable for Xpert Xpress SARS-CoV-2/FLU/RSV testing.  Fact Sheet for Patients: EntrepreneurPulse.com.au  Fact Sheet for Healthcare Providers: IncredibleEmployment.be  This test is not yet approved or cleared by the [place] FDA and has been authorized for detection and/or diagnosis of SARS-CoV-2 by FDA under an Emergency Use Authorization (EUA). This EUA will remain in effect  (meaning this test can be used) for the duration of the COVID-19 declaration under Section 564(b)(1) of the Act, 21 U.S.C. section 360bbb-3(b)(1), unless the authorization is terminated or revoked.  Performed at [facility], [place] [address] [place], [place] [address]   MRSA PCR Screening     Status: None   Collection Time: 03/26/20  7:53 PM   Specimen: Nasopharyngeal  Result Value Ref Range Status   MRSA by PCR NEGATIVE NEGATIVE Final    Comment:        The GeneXpert MRSA Assay (FDA approved for NASAL specimens only), is one component of a comprehensive MRSA colonization surveillance program. It is not intended to diagnose MRSA infection nor to guide or monitor treatment for MRSA infections. Performed at [facility], [place] [address], [place], [place] [address]   Culture, Urine     Status: None   Collection Time: 03/26/20  9:12 PM   Specimen: Urine, Catheterized  Result Value Ref Range Status   Specimen Description URINE, CATHETERIZED  Final   Special Requests NONE  Final   Culture   Final    NO GROWTH Performed at [facility], [address], [place], [place] [address]    Report Status 03/28/2020 FINAL  Final  SARS CORONAVIRUS 2 (TAT 6-24 HRS) Nasopharyngeal Nasopharyngeal Swab     Status: None   Collection Time: 03/30/20 11:15 AM   Specimen: Nasopharyngeal Swab  Result Value Ref Range Status   SARS Coronavirus 2 NEGATIVE NEGATIVE Final    Comment: (NOTE) SARS-CoV-2 target nucleic acids are NOT DETECTED.  The SARS-CoV-2 RNA is generally detectable in upper and lower respiratory specimens during the acute phase of infection. Negative results do not preclude SARS-CoV-2 infection, do not rule out co-infections with other pathogens, and should not be used as the sole basis for treatment or other patient management decisions. Negative results must be combined with clinical observations, patient history, and epidemiological information. The  expected result is Negative.  Fact Sheet for Patients: SugarRoll.be  Fact Sheet for Healthcare Providers: https://www.woods-mathews.com/  This test is not yet approved or cleared by the [place] FDA and  has been authorized for detection and/or diagnosis of SARS-CoV-2 by FDA under an Emergency Use Authorization (EUA). This EUA will remain  in effect (meaning this test can be used) for the duration of the COVID-19 declaration under Se ction 564(b)(1) of the Act, 21 U.S.C. section 360bbb-3(b)(1), unless the authorization  is terminated or revoked sooner.  Performed at [facility], [address] [place], [place] [address]       Radiology Studies: No results found.   Scheduled Meds: . alteplase  2 mg Intracatheter Once  . amitriptyline  25 mg Oral QHS  . atorvastatin  20 mg Oral Daily  . chlorhexidine  15 mL Mouth Rinse BID  . Chlorhexidine Gluconate Cloth  6 each Topical Q0600  . DULoxetine  60 mg Oral BID  . fentaNYL  1 patch Transdermal Q72H  . gabapentin  300 mg Oral TID  . heparin  5,000 Units Subcutaneous Q8H  . insulin aspart  0-20 Units Subcutaneous TID WC  . insulin aspart  0-5 Units Subcutaneous QHS  . insulin aspart protamine- aspart  75 Units Subcutaneous BID WC  . levETIRAcetam  750 mg Oral BID  . levothyroxine  150 mcg Oral Daily  . lidocaine  4 patch Transdermal Q24H  . mouth rinse  15 mL Mouth Rinse q12n4p  . pantoprazole  40 mg Oral Daily  . sodium chloride flush  3 mL Intravenous Q12H   Continuous Infusions: . sodium chloride       LOS: 5 days   Time Spent in minutes   30 minutes  Cleopatra Sardo D.O. on 03/31/2020 at 1:07 PM  Between 7am to 7pm - Please see pager noted on [email or web address]  After 7pm go to [email or web address]  And look for the night coverage person covering for me after hours  [facility] Office  [phone]

## 2020-03-31 NOTE — Progress Notes (Signed)
[employer] called and confirmed pt has auth but a facility needs to be added to get auth details. [id number]

## 2020-03-31 NOTE — Progress Notes (Signed)
Physical Therapy Treatment Patient Details Name: [name] MRN: [id number] DOB: [birth year] Today's Date: 03/31/2020    History of Present Illness [name] is a 50 y.o. female with medical history significant of OSA, PVD, OHS, hypothyroidism, HTN, DM;,depression/anxiety, seizures and chronic pain presenting with lethargy, AMS and tremor like movements in setting of AKI and polypharmarcy. Previously admitted 12/14-19 with AKI, thought to be due to hyperglycemia. CT head negative for acute intracranial abnormality.    PT Comments     Today's skilled session focused on mobility progression with use of pt's personal AFO on right LE. Pt able to progress steps with RW and to OOB to chair this session. Of note, pt's AFO appears to be too small/short for her as it only reaches to her tarsal heads where it puts pressure that can lead to breakdown. Pt reports she has had this one since [employer] made in on 2013. This is something that can be addressed at the next venue of care if not able to be addressed during her acute care stay. Acute PT to continue while pt is in the hospital.     Follow Up Recommendations  SNF     Equipment Recommendations  Other (comment) (to be determined by next venue of care)    Precautions / Restrictions Precautions Precautions: Fall;Other (comment) Precaution Comments: drop foot RLE, wears AFO- in room. Of note AFO is too short on her foot (comes just to her tarsal heads when fully seated in the heel) due to being from 2003. Pt would benefit from a new one. Restrictions Weight Bearing Restrictions: No    Mobility  Bed Mobility Overal bed mobility: Needs Assistance Bed Mobility: Supine to Sit     Supine to sit: Mod assist;HOB elevated     General bed mobility comments: pt able to bring bil LE's off edge of bed. with HOB elevated and rail used mod assist to fully elevated her trunk into sitting position. Once seated pt was able to scoot herself closer  to edge of bed for her feet to touch the ground with bil UE's.  Transfers Overall transfer level: Needs assistance Equipment used: Rolling walker (2 wheeled) Transfers: Sit to/from [facility] Sit to Stand: Min assist;+2 physical assistance;From elevated surface Stand pivot transfers: Min assist;+2 physical assistance;Min guard       General transfer comment: total assist to don right AFO and bil sneaker prior to standing/transfering to chair. min assist of 2 for pt to stand bed to RW. Then with min guard to min assist of 2 for 3-4 lateral steps along edge of bed, progressing to 4-5 pivot steps from bed to recliner. min assist of 1 person for controlled descent into recliner. No buckling noted. Pt with good foot clearance with AFO donned.      Cognition Arousal/Alertness: Awake/alert Behavior During Therapy: WFL for tasks assessed/performed Overall Cognitive Status: Within Functional Limits for tasks assessed                Pertinent Vitals/Pain Pain Assessment: 0-10 Pain Score: 4  Pain Location: back, R buttocks Pain Descriptors / Indicators: Aching;Sore Pain Intervention(s): Limited activity within patient's tolerance;Monitored during session;Repositioned     PT Goals (current goals can now be found in the care plan section) Acute Rehab PT Goals Patient Stated Goal: to get stronger, less pain PT Goal Formulation: With patient Time For Goal Achievement: 04/12/20 Potential to Achieve Goals: Good Progress towards PT goals: Progressing toward goals    Frequency  Min 3X/week      PT Plan Current plan remains appropriate    AM-PAC PT "6 Clicks" Mobility   Outcome Measure  Help needed turning from your back to your side while in a flat bed without using bedrails?: A Little Help needed moving from lying on your back to sitting on the side of a flat bed without using bedrails?: A Lot Help needed moving to and from a bed to a chair (including a  wheelchair)?: A Little Help needed standing up from a chair using your arms (e.g., wheelchair or bedside chair)?: A Little Help needed to walk in hospital room?: A Lot Help needed climbing 3-5 steps with a railing? : Total 6 Click Score: 14    End of Session Equipment Utilized During Treatment: Gait belt Activity Tolerance: Patient tolerated treatment well;No increased pain Patient left: in chair;with call bell/phone within reach;with chair alarm set Nurse Communication: Mobility status PT Visit Diagnosis: Other abnormalities of gait and mobility (R26.89)     Time: 9242-6834 PT Time Calculation (min) (ACUTE ONLY): 14 min  Charges:  $Therapeutic Activity: 8-22 mins                    Willow Ora, PTA, [facility] Acute Rehab Services Office- [phone] 03/31/20, 3:14 PM  Willow Ora 03/31/2020, 3:13 PM

## 2020-03-31 NOTE — Progress Notes (Signed)
Foley catherer has been removed. Patient is due to void around 8pm.

## 2020-04-01 DIAGNOSIS — G894 Chronic pain syndrome: Secondary | ICD-10-CM | POA: Diagnosis not present

## 2020-04-01 DIAGNOSIS — N171 Acute kidney failure with acute cortical necrosis: Secondary | ICD-10-CM | POA: Diagnosis not present

## 2020-04-01 DIAGNOSIS — E118 Type 2 diabetes mellitus with unspecified complications: Secondary | ICD-10-CM | POA: Diagnosis not present

## 2020-04-01 DIAGNOSIS — G253 Myoclonus: Secondary | ICD-10-CM | POA: Diagnosis not present

## 2020-04-01 LAB — CULTURE, BLOOD (ROUTINE X 2)
Culture: NO GROWTH
Culture: NO GROWTH
Special Requests: ADEQUATE
Special Requests: ADEQUATE

## 2020-04-01 LAB — RENAL FUNCTION PANEL
Albumin: 2.5 g/dL — ABNORMAL LOW (ref 3.5–5.0)
Anion gap: 8 (ref 5–15)
BUN: 16 mg/dL (ref 6–20)
CO2: 24 mmol/L (ref 22–32)
Calcium: 8.8 mg/dL — ABNORMAL LOW (ref 8.9–10.3)
Chloride: 104 mmol/L (ref 98–111)
Creatinine, Ser: 0.89 mg/dL (ref 0.44–1.00)
GFR, Estimated: >60 mL/min (ref >60)
Glucose, Bld: 297 mg/dL — ABNORMAL HIGH (ref 70–99)
Phosphorus: 2.6 mg/dL (ref 2.5–4.6)
Potassium: 4.4 mmol/L (ref 3.5–5.1)
Sodium: 136 mmol/L (ref 135–145)

## 2020-04-01 LAB — GLUCOSE, CAPILLARY
Glucose-Capillary: 261 mg/dL — ABNORMAL HIGH (ref 70–99)
Glucose-Capillary: 219 mg/dL — ABNORMAL HIGH (ref 70–99)
Glucose-Capillary: 184 mg/dL — ABNORMAL HIGH (ref 70–99)
Glucose-Capillary: 232 mg/dL — ABNORMAL HIGH (ref 70–99)

## 2020-04-01 NOTE — TOC Progression Note (Signed)
Transition of Care [facility]) - Progression Note    Patient Details  Name: [name] MRN: [id number] Date of Birth: 04/16/1970  [facility] CM/SW [place], [place] Phone Number: [phone] 04/01/2020, 4:33 PM  Clinical Narrative:     Patient still does not have any other bed offers except for [facility]. They may have a bed at the beginning of the week.  TOC team will continue to assist with discharge planning needs.  Expected Discharge Plan: [place] Barriers to Discharge: Continued Medical Work up  Expected Discharge Plan and Services Expected Discharge Plan: [place]   Discharge Planning Services: CM Consult Post Acute Care Choice: [place] Living arrangements for the past 2 months: Single Family Home                   DME Agency: NA       HH Arranged: NA           Social Determinants of Health (SDOH) Interventions    Readmission Risk Interventions No flowsheet data found.

## 2020-04-01 NOTE — Progress Notes (Signed)
PROGRESS NOTE    [name]  [id number] DOB: 14-May-1970 DOA: 03/26/2020 PCP: Pecolia Ades, NP   Brief Narrative:  HPI on 03/26/2020 by Dr. Massie Bougie is a 50 y.o. female with medical history significant of OSA; PVD; OHS; hypothyroidism; HTN; DM; depression/anxiety; and chronic pain presenting with lethargy, AMS.  She was previously admitted from 12/14-19 with AKI, thought to be due to hyperglycemia from uncontrolled DM (A1c 11.9).  She was also thought to have an acute UTI (no culture done) and myoclonus associated with Neurontin.  The patient returns again with AMS and AKI.  Her boyfriend reports that she has chronic pain syndrome and that he is concerned about how much medication she takes.  She has had perseveration and been altered.  She does not wear CPAP/BIPAP and he is unaware of whether she has episodes of apnea at home.  He reports that she has been like this since last night.  The patient is periodically very somnolent with shallow respirations (?baseline) alternating with periods of being alert and able to answer a few questions but perseverates.  She has myoclonic jerks periodically.  Interim history Patient initially presented with altered mental status and was noted to have acute kidney injury with worsening mental status.  Was then transferred to ICU given change in respiratory pattern as well.  Patient was also noted to have myoclonus and was started on CVVHD given acute kidney injury.  Creatinine and mental status have improved.  Assessment & Plan   Acute metabolic/toxic encephalopathy -Suspected to be secondary to gabapentin toxicity in the setting of acute renal failure also with hyperglycemia -Patient was noted to have myoclonus -Neurology as well as nephrology consulted and appreciated -All home medications were held initially- have restarted some at smaller doses and monitoring closely -mental status currently stable, AAOx3  Acute  respiratory insufficiency with hypoxia/history of sleep apnea -patient does not use CPAP at home -She may benefit from sleep apnea testing as well as CPAP, outpatient pulmonology referral -Currently on room air  Acute kidney injury -Resolved -CT renal stone study without stones or hydronephrosis -Nephrology consulted and appreciated m-creatinine down to 0.89 today (was 4.89 on admission) -Patient did have 6 hours of CRRT however catheter clotted and was given TPA but without improvement of flows -Given her renal and mental status have improved and no further need for CVVH or HD at this time  Chronic pain/fibromyalgia and depression -Currently on Elavil, cymbalta, fentanyl patch -Continue gabapentin 300mg  TID, Soma 350mg  BID PRN -Discussed with nephrology, will not resume baclofen given that it is renally excreted   Hypothyroidism -Continue Synthroid  Diabetes mellitus, type II with HHS -Patient did require insulin drip however has been transitioned to 70/30 and insulin sliding scale -Continue CBG monitoring -Continue carb modified diet  Morbid obesity -BMI 61.38 -Patient will need to follow-up with PCP for lifestyle modifications  History of seizure disorder -continue keppra  DVT Prophylaxis  Heparin   Code Status: Full  Family Communication: Boyfriend at bedside  Disposition Plan:  Status is: Inpatient  Remains inpatient appropriate because:Pending SNF.   Dispo: The patient is from: Home              Anticipated d/c is to: SNF              Anticipated d/c date is: 1 day              Patient currently is not medically stable to d/c.  Consultants PCCM Nephrology Neurology  Procedures  CRRT  Antibiotics   Anti-infectives (From admission, onward)   Start     Dose/Rate Route Frequency Ordered Stop   03/29/20 1500  ceFEPIme (MAXIPIME) 2 g in sodium chloride 0.9 % 100 mL IVPB  Status:  Discontinued        2 g 200 mL/hr over 30 Minutes Intravenous Every 8 hours  03/29/20 0937 03/29/20 1439   03/26/20 2200  ceFEPIme (MAXIPIME) 2 g in sodium chloride 0.9 % 100 mL IVPB  Status:  Discontinued        2 g 200 mL/hr over 30 Minutes Intravenous Every 24 hours 03/26/20 0536 03/26/20 1802   03/26/20 2000  ceFEPIme (MAXIPIME) 2 g in sodium chloride 0.9 % 100 mL IVPB  Status:  Discontinued        2 g 200 mL/hr over 30 Minutes Intravenous Every 12 hours 03/26/20 1802 03/29/20 0937   03/26/20 0537  vancomycin variable dose per unstable renal function (pharmacist dosing)  Status:  Discontinued         Does not apply See admin instructions 03/26/20 0537 03/26/20 0847   03/26/20 0130  aztreonam (AZACTAM) 2 g in sodium chloride 0.9 % 100 mL IVPB  Status:  Discontinued        2 g 200 mL/hr over 30 Minutes Intravenous  Once 03/26/20 0117 03/26/20 0127   03/26/20 0130  metroNIDAZOLE (FLAGYL) IVPB 500 mg        500 mg 100 mL/hr over 60 Minutes Intravenous  Once 03/26/20 0117 03/26/20 0339   03/26/20 0130  vancomycin (VANCOCIN) IVPB 1000 mg/200 mL premix  Status:  Discontinued        1,000 mg 200 mL/hr over 60 Minutes Intravenous  Once 03/26/20 0117 03/26/20 0127   03/26/20 0130  ceFEPIme (MAXIPIME) 2 g in sodium chloride 0.9 % 100 mL IVPB        2 g 200 mL/hr over 30 Minutes Intravenous  Once 03/26/20 0127 03/26/20 0220   03/26/20 0130  vancomycin (VANCOREADY) IVPB 2000 mg/400 mL        2,000 mg 200 mL/hr over 120 Minutes Intravenous  Once 03/26/20 0127 03/26/20 0998      Subjective:   [name] seen and examined today.  Patient feeling better today.  Continues to have chronic pain.  Denies current chest pain or shortness of breath, abdominal pain, nausea or vomiting, diarrhea constipation, dizziness or headache.  Inquiring about possibly going home instead of skilled nursing facility if one is not found on Monday.    Objective:   Vitals:   03/31/20 1224 03/31/20 2231 04/01/20 0628 04/01/20 0643  BP: 108/62 113/63  108/71  Pulse: 88 81  80  Resp: 20 20   20   Temp: 98 F (36.7 C) 99.1 F (37.3 C)  98.1 F (36.7 C)  TempSrc: Oral Oral  Oral  SpO2: 97% 97%  100%  Weight:   (!) 164.4 kg   Height:        Intake/Output Summary (Last 24 hours) at 04/01/2020 1009 Last data filed at 04/01/2020 0300 Gross per 24 hour  Intake 780 ml  Output 1000 ml  Net -220 ml   Filed Weights   03/30/20 0421 03/31/20 0525 04/01/20 0628  Weight: (!) 165.7 kg (!) 165.1 kg (!) 164.4 kg   Exam  General: Well developed, chronically ill-appearing, NAD  HEENT: NCAT, mucous membranes moist.   Cardiovascular: S1 S2 auscultated, RRR, no murmur.  Respiratory: Clear to auscultation bilaterally  with equal chest rise, no wheezing  Abdomen: Soft, obese, nontender, nondistended, + bowel sounds  Extremities: warm dry without cyanosis clubbing or edema  Neuro: AAOx3, nonfocal  Psych: appropriate mood and affect, pleasant  Data Reviewed: I have personally reviewed following labs and imaging studies  CBC: Recent Labs  Lab 03/26/20 0130 03/26/20 0824 03/27/20 0126 03/27/20 0149 03/27/20 0408 03/29/20 0155 03/30/20 0327  WBC 14.8*  --  11.5*  --   --  6.3 7.0  NEUTROABS 9.2*  --   --   --   --   --   --   HGB 13.8   < > 12.7 12.9 13.3 10.4* 11.1*  HCT 44.0   < > 38.4 38.0 39.0 31.6* 33.5*  MCV 97.3  --  95.0  --   --  95.8 94.9  PLT 279  --  211  --   --  110* 148*   < > = values in this interval not displayed.   Basic Metabolic Panel: Recent Labs  Lab 03/27/20 0126 03/27/20 0149 03/28/20 0434 03/29/20 0155 03/30/20 0327 03/31/20 0124 04/01/20 0431  NA 134*   < > 134* 136 136 136 136  K 4.2   < > 4.3 4.7 4.7 4.5 4.4  CL 103   < > 104 106 105 102 104  CO2 18*   < > 21* 23 23 24 24   GLUCOSE 133*   < > 135* 192* 114* 240* 297*  BUN 47*   < > 39* 35* 21* 18 16  CREATININE 4.32*   < > 1.80* 1.17* 0.81 0.79 0.89  CALCIUM 8.1*   < > 7.7* 8.2* 8.7* 8.9 8.8*  MG 1.4*  --  1.7  --   --   --   --   PHOS 5.8*   < > 3.7 2.8 2.3* 2.4* 2.6   < > = values  in this interval not displayed.   GFR: Estimated Creatinine Clearance: 118.1 mL/min (by C-G formula based on SCr of 0.89 mg/dL). Liver Function Tests: Recent Labs  Lab 03/26/20 0130 03/27/20 0126 03/28/20 0434 03/29/20 0155 03/30/20 0327 03/31/20 0124 04/01/20 0431  AST 57*  --   --   --   --   --   --   ALT 41  --   --   --   --   --   --   ALKPHOS 109  --   --   --   --   --   --   BILITOT 0.6  --   --   --   --   --   --   PROT 6.8  --   --   --   --   --   --   ALBUMIN 3.6   < > 2.6* 2.5* 2.5* 2.5* 2.5*   < > = values in this interval not displayed.   No results for input(s): LIPASE, AMYLASE in the last 168 hours. No results for input(s): AMMONIA in the last 168 hours. Coagulation Profile: Recent Labs  Lab 03/26/20 0130  INR 1.1   Cardiac Enzymes: No results for input(s): CKTOTAL, CKMB, CKMBINDEX, TROPONINI in the last 168 hours. BNP (last 3 results) No results for input(s): PROBNP in the last 8760 hours. HbA1C: No results for input(s): HGBA1C in the last 72 hours. CBG: Recent Labs  Lab 03/31/20 0616 03/31/20 1138 03/31/20 1618 03/31/20 2214 04/01/20 0616  GLUCAP 381* 193* 234* 251* 261*   Lipid Profile: No results  for input(s): CHOL, HDL, LDLCALC, TRIG, CHOLHDL, LDLDIRECT in the last 72 hours. Thyroid Function Tests: No results for input(s): TSH, T4TOTAL, FREET4, T3FREE, THYROIDAB in the last 72 hours. Anemia Panel: No results for input(s): VITAMINB12, FOLATE, FERRITIN, TIBC, IRON, RETICCTPCT in the last 72 hours. Urine analysis:    Component Value Date/Time   COLORURINE AMBER (A) 03/26/2020 0252   APPEARANCEUR HAZY (A) 03/26/2020 0252   LABSPEC 1.027 03/26/2020 0252   PHURINE 5.0 03/26/2020 0252   GLUCOSEU 50 (A) 03/26/2020 0252   HGBUR NEGATIVE 03/26/2020 0252   BILIRUBINUR SMALL (A) 03/26/2020 0252   KETONESUR NEGATIVE 03/26/2020 0252   PROTEINUR 30 (A) 03/26/2020 0252   UROBILINOGEN 0.2 09/05/2012 1141   NITRITE NEGATIVE 03/26/2020 0252    LEUKOCYTESUR NEGATIVE 03/26/2020 0252   Sepsis Labs: @LABRCNTIP (procalcitonin:4,lacticidven:4)  ) Recent Results (from the past 240 hour(s))  Blood Culture (routine x 2)     Status: None (Preliminary result)   Collection Time: 03/26/20  1:20 AM   Specimen: BLOOD  Result Value Ref Range Status   Specimen Description BLOOD SITE NOT SPECIFIED  Final   Special Requests   Final    BOTTLES DRAWN AEROBIC AND ANAEROBIC Blood Culture adequate volume   Culture   Final    NO GROWTH 4 DAYS Performed at [facility], [place] [address] [place], [place] [address]    Report Status PENDING  Incomplete  Blood Culture (routine x 2)     Status: None (Preliminary result)   Collection Time: 03/26/20  1:30 AM   Specimen: BLOOD  Result Value Ref Range Status   Specimen Description BLOOD SITE NOT SPECIFIED  Final   Special Requests   Final    BOTTLES DRAWN AEROBIC AND ANAEROBIC Blood Culture adequate volume   Culture   Final    NO GROWTH 4 DAYS Performed at [facility], [address], [place], [place] [address]    Report Status PENDING  Incomplete  Urine culture     Status: None   Collection Time: 03/26/20  2:52 AM   Specimen: In/Out Cath Urine  Result Value Ref Range Status   Specimen Description IN/OUT CATH URINE  Final   Special Requests NONE  Final   Culture   Final    NO GROWTH Performed at [facility], [place] [address] [place], [place] [address]    Report Status 03/27/2020 FINAL  Final  Resp Panel by RT-PCR (Flu A&B, Covid) Nasopharyngeal Swab     Status: None   Collection Time: 03/26/20  4:38 AM   Specimen: Nasopharyngeal Swab; Nasopharyngeal(NP) swabs in vial transport medium  Result Value Ref Range Status   SARS Coronavirus 2 by RT PCR NEGATIVE NEGATIVE Final    Comment: (NOTE) SARS-CoV-2 target nucleic acids are NOT DETECTED.  The SARS-CoV-2 RNA is generally detectable in upper respiratory specimens during the acute phase of infection. The lowest concentration  of SARS-CoV-2 viral copies this assay can detect is 138 copies/mL. A negative result does not preclude SARS-Cov-2 infection and should not be used as the sole basis for treatment or other patient management decisions. A negative result may occur with  improper specimen collection/handling, submission of specimen other than nasopharyngeal swab, presence of viral mutation(s) within the areas targeted by this assay, and inadequate number of viral copies(<138 copies/mL). A negative result must be combined with clinical observations, patient history, and epidemiological information. The expected result is Negative.  Fact Sheet for Patients:  EntrepreneurPulse.com.au  Fact Sheet for  Healthcare Providers:  IncredibleEmployment.be  This test is no t yet approved or cleared by the [place] and  has been authorized for detection and/or diagnosis of SARS-CoV-2 by FDA under an Emergency Use Authorization (EUA). This EUA will remain  in effect (meaning this test can be used) for the duration of the COVID-19 declaration under Section 564(b)(1) of the Act, 21 U.S.C.section 360bbb-3(b)(1), unless the authorization is terminated  or revoked sooner.       Influenza A by PCR NEGATIVE NEGATIVE Final   Influenza B by PCR NEGATIVE NEGATIVE Final    Comment: (NOTE) The Xpert Xpress SARS-CoV-2/FLU/RSV plus assay is intended as an aid in the diagnosis of influenza from Nasopharyngeal swab specimens and should not be used as a sole basis for treatment. Nasal washings and aspirates are unacceptable for Xpert Xpress SARS-CoV-2/FLU/RSV testing.  Fact Sheet for Patients: EntrepreneurPulse.com.au  Fact Sheet for Healthcare Providers: IncredibleEmployment.be  This test is not yet approved or cleared by the [place] FDA and has been authorized for detection and/or diagnosis of SARS-CoV-2 by FDA under an Emergency Use  Authorization (EUA). This EUA will remain in effect (meaning this test can be used) for the duration of the COVID-19 declaration under Section 564(b)(1) of the Act, 21 U.S.C. section 360bbb-3(b)(1), unless the authorization is terminated or revoked.  Performed at [facility], [place] [address] [place], [place] [address]   MRSA PCR Screening     Status: None   Collection Time: 03/26/20  7:53 PM   Specimen: Nasopharyngeal  Result Value Ref Range Status   MRSA by PCR NEGATIVE NEGATIVE Final    Comment:        The GeneXpert MRSA Assay (FDA approved for NASAL specimens only), is one component of a comprehensive MRSA colonization surveillance program. It is not intended to diagnose MRSA infection nor to guide or monitor treatment for MRSA infections. Performed at [facility], [place] [address] [place], [place] [address]   Culture, Urine     Status: None   Collection Time: 03/26/20  9:12 PM   Specimen: Urine, Catheterized  Result Value Ref Range Status   Specimen Description URINE, CATHETERIZED  Final   Special Requests NONE  Final   Culture   Final    NO GROWTH Performed at [facility], [address], [place],  [address]    Report Status 03/28/2020 FINAL  Final  SARS CORONAVIRUS 2 (TAT 6-24 HRS) Nasopharyngeal Nasopharyngeal Swab     Status: None   Collection Time: 03/30/20 11:15 AM   Specimen: Nasopharyngeal Swab  Result Value Ref Range Status   SARS Coronavirus 2 NEGATIVE NEGATIVE Final    Comment: (NOTE) SARS-CoV-2 target nucleic acids are NOT DETECTED.  The SARS-CoV-2 RNA is generally detectable in upper and lower respiratory specimens during the acute phase of infection. Negative results do not preclude SARS-CoV-2 infection, do not rule out co-infections with other pathogens, and should not be used as the sole basis for treatment or other patient management decisions. Negative results must be combined with clinical observations, patient  history, and epidemiological information. The expected result is Negative.  Fact Sheet for Patients: SugarRoll.be  Fact Sheet for Healthcare Providers: https://www.woods-mathews.com/  This test is not yet approved or cleared by the [place] FDA and  has been authorized for detection and/or diagnosis of SARS-CoV-2 by FDA under an Emergency Use Authorization (EUA). This EUA will remain  in effect (meaning this test can be used) for the  duration of the COVID-19 declaration under Se ction 564(b)(1) of the Act, 21 U.S.C. section 360bbb-3(b)(1), unless the authorization is terminated or revoked sooner.  Performed at [facility], [name] [address], [place], [place] [address]       Radiology Studies: No results found.   Scheduled Meds: . alteplase  2 mg Intracatheter Once  . amitriptyline  25 mg Oral QHS  . atorvastatin  20 mg Oral Daily  . chlorhexidine  15 mL Mouth Rinse BID  . Chlorhexidine Gluconate Cloth  6 each Topical Q0600  . DULoxetine  60 mg Oral BID  . fentaNYL  1 patch Transdermal Q72H  . gabapentin  300 mg Oral TID  . heparin  5,000 Units Subcutaneous Q8H  . insulin aspart  0-20 Units Subcutaneous TID WC  . insulin aspart  0-5 Units Subcutaneous QHS  . insulin aspart protamine- aspart  75 Units Subcutaneous BID WC  . levETIRAcetam  750 mg Oral BID  . levothyroxine  150 mcg Oral Daily  . lidocaine  4 patch Transdermal Q24H  . mouth rinse  15 mL Mouth Rinse q12n4p  . pantoprazole  40 mg Oral Daily  . sodium chloride flush  3 mL Intravenous Q12H   Continuous Infusions: . sodium chloride       LOS: 6 days   Time Spent in minutes   30 minutes  Zaide Kardell D.O. on 04/01/2020 at 10:09 AM  Between 7am to 7pm - Please see pager noted on [email or web address]  After 7pm go to [email or web address]  And look for the night coverage person covering for me after hours  [facility] Office  [phone]

## 2020-04-01 NOTE — Progress Notes (Signed)
Pt refused BiPAP for the night.

## 2020-04-01 NOTE — Plan of Care (Signed)
  Problem: Clinical Measurements: Goal: Diagnostic test results will improve Outcome: Progressing Goal: Respiratory complications will improve Outcome: Progressing   Problem: Activity: Goal: Risk for activity intolerance will decrease Outcome: Progressing   

## 2020-04-02 DIAGNOSIS — N171 Acute kidney failure with acute cortical necrosis: Secondary | ICD-10-CM | POA: Diagnosis not present

## 2020-04-02 DIAGNOSIS — G253 Myoclonus: Secondary | ICD-10-CM | POA: Diagnosis not present

## 2020-04-02 DIAGNOSIS — G894 Chronic pain syndrome: Secondary | ICD-10-CM | POA: Diagnosis not present

## 2020-04-02 DIAGNOSIS — E118 Type 2 diabetes mellitus with unspecified complications: Secondary | ICD-10-CM | POA: Diagnosis not present

## 2020-04-02 LAB — GLUCOSE, CAPILLARY
Glucose-Capillary: 211 mg/dL — ABNORMAL HIGH (ref 70–99)
Glucose-Capillary: 243 mg/dL — ABNORMAL HIGH (ref 70–99)
Glucose-Capillary: 251 mg/dL — ABNORMAL HIGH (ref 70–99)
Glucose-Capillary: 215 mg/dL — ABNORMAL HIGH (ref 70–99)

## 2020-04-02 MED ORDER — NYSTATIN 100000 UNIT/GM EX POWD
Freq: Two times a day (BID) | CUTANEOUS | Status: DC
  Filled 2020-04-02: qty 15

## 2020-04-02 MED ORDER — SENNOSIDES-DOCUSATE SODIUM 8.6-50 MG PO TABS
1.0000 | ORAL_TABLET | Freq: Two times a day (BID) | ORAL | Status: DC
  Administered 2020-04-02 – 2020-04-03 (×2): 1 via ORAL
  Filled 2020-04-02 (×3): qty 1

## 2020-04-02 MED ORDER — BISACODYL 5 MG PO TBEC
5.0000 mg | DELAYED_RELEASE_TABLET | Freq: Every day | ORAL | Status: DC | PRN
  Administered 2020-04-02: 5 mg via ORAL
  Filled 2020-04-02: qty 1

## 2020-04-02 NOTE — Progress Notes (Signed)
PROGRESS NOTE    [name]  WCB:[id number] DOB: 1970-12-06 DOA: 03/26/2020 PCP: Pecolia Ades, NP   Brief Narrative:  HPI on 03/26/2020 by Dr. Massie Bougie is a 50 y.o. female with medical history significant of OSA; PVD; OHS; hypothyroidism; HTN; DM; depression/anxiety; and chronic pain presenting with lethargy, AMS.  She was previously admitted from 12/14-19 with AKI, thought to be due to hyperglycemia from uncontrolled DM (A1c 11.9).  She was also thought to have an acute UTI (no culture done) and myoclonus associated with Neurontin.  The patient returns again with AMS and AKI.  Her boyfriend reports that she has chronic pain syndrome and that he is concerned about how much medication she takes.  She has had perseveration and been altered.  She does not wear CPAP/BIPAP and he is unaware of whether she has episodes of apnea at home.  He reports that she has been like this since last night.  The patient is periodically very somnolent with shallow respirations (?baseline) alternating with periods of being alert and able to answer a few questions but perseverates.  She has myoclonic jerks periodically.  Interim history Patient initially presented with altered mental status and was noted to have acute kidney injury with worsening mental status.  Was then transferred to ICU given change in respiratory pattern as well.  Patient was also noted to have myoclonus and was started on CVVHD given acute kidney injury.  Creatinine and mental status have improved.  Assessment & Plan   Acute metabolic/toxic encephalopathy -Suspected to be secondary to gabapentin toxicity in the setting of acute renal failure also with hyperglycemia -Patient was noted to have myoclonus -Neurology as well as nephrology consulted and appreciated -All home medications were held initially- have restarted some at smaller doses and monitoring closely -mental status currently stable, AAOx3  Acute  respiratory insufficiency with hypoxia/history of sleep apnea -patient does not use CPAP at home -She may benefit from sleep apnea testing as well as CPAP, outpatient pulmonology referral -Currently on room air  Acute kidney injury -Resolved -CT renal stone study without stones or hydronephrosis -Nephrology consulted and appreciated m-creatinine down to 0.89 (was 4.89 on admission) -Patient did have 6 hours of CRRT however catheter clotted and was given TPA but without improvement of flows -Given her renal and mental status have improved and no further need for CVVH or HD at this time  Chronic pain/fibromyalgia and depression -Currently on Elavil, cymbalta, fentanyl patch -Continue gabapentin 300mg  TID, Soma 350mg  BID PRN -Discussed with nephrology, will not resume baclofen given that it is renally excreted   Hypothyroidism -Continue Synthroid  Diabetes mellitus, type II with HHS -Patient did require insulin drip however has been transitioned to 70/30 and insulin sliding scale -Continue CBG monitoring -Continue carb modified diet  Morbid obesity -BMI 61.38 -Patient will need to follow-up with PCP for lifestyle modifications  History of seizure disorder -continue keppra  Physical deconditioning -PT OT recommending SNF -Patient states that if an SNF is not found by Monday, 04/03/2020, she would like to go home with home health -Have discussed at length the patient does need supervision at home given her current situation.  Constipation -Have ordered bowel regimen, will continue to monitor  DVT Prophylaxis  Heparin   Code Status: Full  Family Communication: None at bedside  Disposition Plan:  Status is: Inpatient  Remains inpatient appropriate because:Pending SNF.   Dispo: The patient is from: Home  Anticipated d/c is to: SNF              Anticipated d/c date is: 1 day              Patient currently is not medically stable to  d/c.  Consultants PCCM Nephrology Neurology  Procedures  CRRT  Antibiotics   Anti-infectives (From admission, onward)   Start     Dose/Rate Route Frequency Ordered Stop   03/29/20 1500  ceFEPIme (MAXIPIME) 2 g in sodium chloride 0.9 % 100 mL IVPB  Status:  Discontinued        2 g 200 mL/hr over 30 Minutes Intravenous Every 8 hours 03/29/20 0937 03/29/20 1439   03/26/20 2200  ceFEPIme (MAXIPIME) 2 g in sodium chloride 0.9 % 100 mL IVPB  Status:  Discontinued        2 g 200 mL/hr over 30 Minutes Intravenous Every 24 hours 03/26/20 0536 03/26/20 1802   03/26/20 2000  ceFEPIme (MAXIPIME) 2 g in sodium chloride 0.9 % 100 mL IVPB  Status:  Discontinued        2 g 200 mL/hr over 30 Minutes Intravenous Every 12 hours 03/26/20 1802 03/29/20 0937   03/26/20 0537  vancomycin variable dose per unstable renal function (pharmacist dosing)  Status:  Discontinued         Does not apply See admin instructions 03/26/20 0537 03/26/20 0847   03/26/20 0130  aztreonam (AZACTAM) 2 g in sodium chloride 0.9 % 100 mL IVPB  Status:  Discontinued        2 g 200 mL/hr over 30 Minutes Intravenous  Once 03/26/20 0117 03/26/20 0127   03/26/20 0130  metroNIDAZOLE (FLAGYL) IVPB 500 mg        500 mg 100 mL/hr over 60 Minutes Intravenous  Once 03/26/20 0117 03/26/20 0339   03/26/20 0130  vancomycin (VANCOCIN) IVPB 1000 mg/200 mL premix  Status:  Discontinued        1,000 mg 200 mL/hr over 60 Minutes Intravenous  Once 03/26/20 0117 03/26/20 0127   03/26/20 0130  ceFEPIme (MAXIPIME) 2 g in sodium chloride 0.9 % 100 mL IVPB        2 g 200 mL/hr over 30 Minutes Intravenous  Once 03/26/20 0127 03/26/20 0220   03/26/20 0130  vancomycin (VANCOREADY) IVPB 2000 mg/400 mL        2,000 mg 200 mL/hr over 120 Minutes Intravenous  Once 03/26/20 0127 03/26/20 [id number]      Subjective:   [name] seen and examined today.  Patient feeling better today.  Continues to have chronic pain.  Denies current chest pain, shortness  of breath, dental pain, nausea or vomiting, diarrhea, dizziness or headache.  Does complain of constipation and states she has not had good bowel movement in days.   Objective:   Vitals:   04/01/20 1700 04/01/20 1932 04/02/20 0600 04/02/20 0900  BP: 114/66 (!) 119/54 102/69 114/70  Pulse: 79 84 87 83  Resp: 20 18 16 20   Temp: 98.4 F (36.9 C) 97.8 F (36.6 C) 98.7 F (37.1 C) 98.6 F (37 C)  TempSrc: Oral Oral Oral Oral  SpO2: 94% 96% 99% 94%  Weight:   (!) 162 kg   Height:        Intake/Output Summary (Last 24 hours) at 04/02/2020 1300 Last data filed at 04/02/2020 0900 Gross per 24 hour  Intake 480 ml  Output 900 ml  Net -420 ml   Filed Weights   03/31/20 0525 04/01/20  [id number] 04/02/20 0600  Weight: (!) 165.1 kg (!) 164.4 kg (!) 162 kg   Exam  General: Well developed, chronically ill-appearing, NAD  HEENT: NCAT,  mucous membranes moist.   Cardiovascular: S1 S2 auscultated, RRR, no murmur.  Respiratory: Clear to auscultation bilaterally with equal chest rise  Abdomen: Soft, obese, nontender, nondistended, + bowel sounds  Extremities: warm dry without cyanosis clubbing or edema  Neuro: AAOx3, nonfocal  Psych: appropriate mood and affect, pleasant  Data Reviewed: I have personally reviewed following labs and imaging studies  CBC: Recent Labs  Lab 03/27/20 0126 03/27/20 0149 03/27/20 0408 03/29/20 0155 03/30/20 0327  WBC 11.5*  --   --  6.3 7.0  HGB 12.7 12.9 13.3 10.4* 11.1*  HCT 38.4 38.0 39.0 31.6* 33.5*  MCV 95.0  --   --  95.8 94.9  PLT 211  --   --  110* [id number]*   Basic Metabolic Panel: Recent Labs  Lab 03/27/20 0126 03/27/20 0149 03/28/20 0434 03/29/20 0155 03/30/20 0327 03/31/20 0124 04/01/20 0431  NA 134*   < > 134* 136 136 136 136  K 4.2   < > 4.3 4.7 4.7 4.5 4.4  CL 103   < > 104 106 105 102 104  CO2 18*   < > 21* 23 23 24 24   GLUCOSE 133*   < > 135* 192* 114* 240* 297*  BUN 47*   < > 39* 35* 21* 18 16  CREATININE 4.32*   < > 1.80* 1.17*  0.81 0.79 0.89  CALCIUM 8.1*   < > 7.7* 8.2* 8.7* 8.9 8.8*  MG 1.4*  --  1.7  --   --   --   --   PHOS 5.8*   < > 3.7 2.8 2.3* 2.4* 2.6   < > = values in this interval not displayed.   GFR: Estimated Creatinine Clearance: 116.9 mL/min (by C-G formula based on SCr of 0.89 mg/dL). Liver Function Tests: Recent Labs  Lab 03/28/20 0434 03/29/20 0155 03/30/20 0327 03/31/20 0124 04/01/20 0431  ALBUMIN 2.6* 2.5* 2.5* 2.5* 2.5*   No results for input(s): LIPASE, AMYLASE in the last 168 hours. No results for input(s): AMMONIA in the last 168 hours. Coagulation Profile: No results for input(s): INR, PROTIME in the last 168 hours. Cardiac Enzymes: No results for input(s): CKTOTAL, CKMB, CKMBINDEX, TROPONINI in the last 168 hours. BNP (last 3 results) No results for input(s): PROBNP in the last 8760 hours. HbA1C: No results for input(s): HGBA1C in the last 72 hours. CBG: Recent Labs  Lab 04/01/20 1123 04/01/20 1652 04/01/20 2145 04/02/20 0634 04/02/20 1120  GLUCAP 219* 184* 232* 211* 243*   Lipid Profile: No results for input(s): CHOL, HDL, LDLCALC, TRIG, CHOLHDL, LDLDIRECT in the last 72 hours. Thyroid Function Tests: No results for input(s): TSH, T4TOTAL, FREET4, T3FREE, THYROIDAB in the last 72 hours. Anemia Panel: No results for input(s): VITAMINB12, FOLATE, FERRITIN, TIBC, IRON, RETICCTPCT in the last 72 hours. Urine analysis:    Component Value Date/Time   COLORURINE AMBER (A) 03/26/2020 0252   APPEARANCEUR HAZY (A) 03/26/2020 0252   LABSPEC 1.027 03/26/2020 0252   PHURINE 5.0 03/26/2020 0252   GLUCOSEU 50 (A) 03/26/2020 0252   HGBUR NEGATIVE 03/26/2020 0252   BILIRUBINUR SMALL (A) 03/26/2020 0252   KETONESUR NEGATIVE 03/26/2020 0252   PROTEINUR 30 (A) 03/26/2020 0252   UROBILINOGEN 0.2 09/05/2012 1141   NITRITE NEGATIVE 03/26/2020 0252   LEUKOCYTESUR NEGATIVE 03/26/2020 0252   Sepsis Labs: @LABRCNTIP (procalcitonin:4,lacticidven:4)  )  Recent Results (from the  past 240 hour(s))  Blood Culture (routine x 2)     Status: None   Collection Time: 03/26/20  1:20 AM   Specimen: BLOOD  Result Value Ref Range Status   Specimen Description BLOOD SITE NOT SPECIFIED  Final   Special Requests   Final    BOTTLES DRAWN AEROBIC AND ANAEROBIC Blood Culture adequate volume   Culture   Final    NO GROWTH 6 DAYS Performed at [facility], [address], [place], [place] [address]    Report Status 04/01/2020 FINAL  Final  Blood Culture (routine x 2)     Status: None   Collection Time: 03/26/20  1:30 AM   Specimen: BLOOD  Result Value Ref Range Status   Specimen Description BLOOD SITE NOT SPECIFIED  Final   Special Requests   Final    BOTTLES DRAWN AEROBIC AND ANAEROBIC Blood Culture adequate volume   Culture   Final    NO GROWTH 6 DAYS Performed at [facility], [place] [address] [place], [place] [address]    Report Status 04/01/2020 FINAL  Final  Urine culture     Status: None   Collection Time: 03/26/20  2:52 AM   Specimen: In/Out Cath Urine  Result Value Ref Range Status   Specimen Description IN/OUT CATH URINE  Final   Special Requests NONE  Final   Culture   Final    NO GROWTH Performed at [facility], [place] [address] [place], [place] [address]    Report Status 03/27/2020 FINAL  Final  Resp Panel by RT-PCR (Flu A&B, Covid) Nasopharyngeal Swab     Status: None   Collection Time: 03/26/20  4:38 AM   Specimen: Nasopharyngeal Swab; Nasopharyngeal(NP) swabs in vial transport medium  Result Value Ref Range Status   SARS Coronavirus 2 by RT PCR NEGATIVE NEGATIVE Final    Comment: (NOTE) SARS-CoV-2 target nucleic acids are NOT DETECTED.  The SARS-CoV-2 RNA is generally detectable in upper respiratory specimens during the acute phase of infection. The lowest concentration of SARS-CoV-2 viral copies this assay can detect is 138 copies/mL. A negative result does not preclude SARS-Cov-2 infection and should not be used as the sole  basis for treatment or other patient management decisions. A negative result may occur with  improper specimen collection/handling, submission of specimen other than nasopharyngeal swab, presence of viral mutation(s) within the areas targeted by this assay, and inadequate number of viral copies(<138 copies/mL). A negative result must be combined with clinical observations, patient history, and epidemiological information. The expected result is Negative.  Fact Sheet for Patients:  EntrepreneurPulse.com.au  Fact Sheet for Healthcare Providers:  IncredibleEmployment.be  This test is no t yet approved or cleared by the [place] FDA and  has been authorized for detection and/or diagnosis of SARS-CoV-2 by FDA under an Emergency Use Authorization (EUA). This EUA will remain  in effect (meaning this test can be used) for the duration of the COVID-19 declaration under Section 564(b)(1) of the Act, 21 U.S.C.section 360bbb-3(b)(1), unless the authorization is terminated  or revoked sooner.       Influenza A by PCR NEGATIVE NEGATIVE Final   Influenza B by PCR NEGATIVE NEGATIVE Final    Comment: (NOTE) The Xpert Xpress SARS-CoV-2/FLU/RSV plus assay is intended as an aid in the diagnosis of influenza from Nasopharyngeal swab specimens and should not be used as a sole basis for treatment. Nasal washings and aspirates are unacceptable for  Xpert Xpress SARS-CoV-2/FLU/RSV testing.  Fact Sheet for Patients: EntrepreneurPulse.com.au  Fact Sheet for Healthcare Providers: IncredibleEmployment.be  This test is not yet approved or cleared by the [place] FDA and has been authorized for detection and/or diagnosis of SARS-CoV-2 by FDA under an Emergency Use Authorization (EUA). This EUA will remain in effect (meaning this test can be used) for the duration of the COVID-19 declaration under Section 564(b)(1) of the Act,  21 U.S.C. section 360bbb-3(b)(1), unless the authorization is terminated or revoked.  Performed at [facility], [place] [address], [place], [place] [address]   MRSA PCR Screening     Status: None   Collection Time: 03/26/20  7:53 PM   Specimen: Nasopharyngeal  Result Value Ref Range Status   MRSA by PCR NEGATIVE NEGATIVE Final    Comment:        The GeneXpert MRSA Assay (FDA approved for NASAL specimens only), is one component of a comprehensive MRSA colonization surveillance program. It is not intended to diagnose MRSA infection nor to guide or monitor treatment for MRSA infections. Performed at [facility], [place] [address], [place], [place] [address]   Culture, Urine     Status: None   Collection Time: 03/26/20  9:12 PM   Specimen: Urine, Catheterized  Result Value Ref Range Status   Specimen Description URINE, CATHETERIZED  Final   Special Requests NONE  Final   Culture   Final    NO GROWTH Performed at [facility], [address] [place], [place] [address]    Report Status 03/28/2020 FINAL  Final  SARS CORONAVIRUS 2 (TAT 6-24 HRS) Nasopharyngeal Nasopharyngeal Swab     Status: None   Collection Time: 03/30/20 11:15 AM   Specimen: Nasopharyngeal Swab  Result Value Ref Range Status   SARS Coronavirus 2 NEGATIVE NEGATIVE Final    Comment: (NOTE) SARS-CoV-2 target nucleic acids are NOT DETECTED.  The SARS-CoV-2 RNA is generally detectable in upper and lower respiratory specimens during the acute phase of infection. Negative results do not preclude SARS-CoV-2 infection, do not rule out co-infections with other pathogens, and should not be used as the sole basis for treatment or other patient management decisions. Negative results must be combined with clinical observations, patient history, and epidemiological information. The expected result is Negative.  Fact Sheet for Patients: SugarRoll.be  Fact Sheet for  Healthcare Providers: https://www.woods-mathews.com/  This test is not yet approved or cleared by the [place] FDA and  has been authorized for detection and/or diagnosis of SARS-CoV-2 by FDA under an Emergency Use Authorization (EUA). This EUA will remain  in effect (meaning this test can be used) for the duration of the COVID-19 declaration under Se ction 564(b)(1) of the Act, 21 U.S.C. section 360bbb-3(b)(1), unless the authorization is terminated or revoked sooner.  Performed at [facility], [place] [address] [place], [place] [address]       Radiology Studies: No results found.   Scheduled Meds: . alteplase  2 mg Intracatheter Once  . amitriptyline  25 mg Oral QHS  . atorvastatin  20 mg Oral Daily  . chlorhexidine  15 mL Mouth Rinse BID  . Chlorhexidine Gluconate Cloth  6 each Topical Q0600  . DULoxetine  60 mg Oral BID  . fentaNYL  1 patch Transdermal Q72H  . gabapentin  300 mg Oral TID  . heparin  5,000 Units Subcutaneous Q8H  . insulin aspart  0-20 Units Subcutaneous TID WC  . insulin aspart  0-5 Units Subcutaneous QHS  . insulin aspart protamine- aspart  75 Units Subcutaneous BID WC  . levETIRAcetam  750 mg Oral BID  . levothyroxine  150 mcg Oral Daily  . lidocaine  4 patch Transdermal Q24H  . mouth rinse  15 mL Mouth Rinse q12n4p  . pantoprazole  40 mg Oral Daily  . senna-docusate  1 tablet Oral BID  . sodium chloride flush  3 mL Intravenous Q12H   Continuous Infusions: . sodium chloride       LOS: 7 days   Time Spent in minutes   30 minutes  Gisele Pack D.O. on 04/02/2020 at 1:00 PM  Between 7am to 7pm - Please see pager noted on [email or web address]  After 7pm go to [email or web address]  And look for the night coverage person covering for me after hours  [facility] Office  [phone]

## 2020-04-02 NOTE — Plan of Care (Signed)
  Problem: Clinical Measurements: Goal: Respiratory complications will improve Outcome: Progressing   Problem: Activity: Goal: Risk for activity intolerance will decrease Outcome: Progressing   

## 2020-04-02 NOTE — Progress Notes (Signed)
PT refused CPAP for the night.

## 2020-04-03 DIAGNOSIS — G4730 Sleep apnea, unspecified: Secondary | ICD-10-CM | POA: Diagnosis not present

## 2020-04-03 DIAGNOSIS — E039 Hypothyroidism, unspecified: Secondary | ICD-10-CM | POA: Diagnosis not present

## 2020-04-03 DIAGNOSIS — N171 Acute kidney failure with acute cortical necrosis: Secondary | ICD-10-CM | POA: Diagnosis not present

## 2020-04-03 LAB — GLUCOSE, CAPILLARY
Glucose-Capillary: 231 mg/dL — ABNORMAL HIGH (ref 70–99)
Glucose-Capillary: 207 mg/dL — ABNORMAL HIGH (ref 70–99)
Glucose-Capillary: 198 mg/dL — ABNORMAL HIGH (ref 70–99)
Glucose-Capillary: 166 mg/dL — ABNORMAL HIGH (ref 70–99)

## 2020-04-03 LAB — SARS CORONAVIRUS 2 (TAT 6-24 HRS): SARS Coronavirus 2: NEGATIVE

## 2020-04-03 MED ORDER — GABAPENTIN 600 MG PO TABS: 300.0000 mg | ORAL_TABLET | Freq: Three times a day (TID) | ORAL | Status: DC

## 2020-04-03 MED ORDER — CARISOPRODOL 350 MG PO TABS: 350.0000 mg | ORAL_TABLET | Freq: Two times a day (BID) | ORAL | Status: DC | PRN

## 2020-04-03 MED ORDER — ACETAMINOPHEN 325 MG PO TABS: 650.0000 mg | ORAL_TABLET | Freq: Four times a day (QID) | ORAL | Status: DC | PRN

## 2020-04-03 MED ORDER — AMITRIPTYLINE HCL 25 MG PO TABS: 25.0000 mg | ORAL_TABLET | Freq: Every day | ORAL | Status: DC

## 2020-04-03 MED ORDER — PANTOPRAZOLE SODIUM 40 MG PO TBEC: 40.0000 mg | DELAYED_RELEASE_TABLET | Freq: Every day | ORAL | Status: DC

## 2020-04-03 MED ORDER — ESOMEPRAZOLE MAGNESIUM 40 MG PO CPDR: 40.0000 mg | DELAYED_RELEASE_CAPSULE | Freq: Every day | ORAL | Status: DC

## 2020-04-03 MED ORDER — OXYCODONE HCL 10 MG PO TABS: 10.0000 mg | ORAL_TABLET | Freq: Four times a day (QID) | ORAL | 0 refills | Status: DC | PRN

## 2020-04-03 MED ORDER — FENTANYL 50 MCG/HR TD PT72: 1.0000 | MEDICATED_PATCH | TRANSDERMAL | 0 refills | Status: AC

## 2020-04-03 NOTE — Progress Notes (Signed)
Auth approved from 1/10 -- 1/12. [id number]. 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

## 2020-04-03 NOTE — Discharge Summary (Addendum)
Physician Discharge Summary  [name]:[id number] DOB: 1971/01/06 DOA: 03/26/2020  PCP: Pecolia Ades, NP  Admit date: 03/26/2020 Discharge date: 04/03/2020  Time spent: 45 minutes  Recommendations for Outpatient Follow-up:  Patient will be discharged to skilled nursing facility, continue physical and occupational therapy.  Patient will need to follow up with primary care provider within one week of discharge, discuss sleep apnea testing.  Follow-up with your pain management specialist.  Patient should continue medications as prescribed.  Patient should follow a carb modfiied diet.   Discharge Diagnoses:  Acute metabolic/toxic encephalopathy Acute respiratory insufficiency with hypoxia/history of sleep apnea Acute kidney injury Chronic pain/fibromyalgia and depression Hypothyroidism Diabetes mellitus, type II with HHS Morbid obesity History of seizure disorder Physical deconditioning Constipation  Discharge Condition: Stable  Diet recommendation: Carb modified   Filed Weights   04/01/20 0628 04/02/20 0600 04/03/20 0032  Weight: (!) 164.4 kg (!) 162 kg (!) 161.7 kg    History of present illness:  on 03/26/2020 by Dr. Anda Kraft a 50 y.o.femalewith medical history significant ofOSA; PVD; OHS; hypothyroidism; HTN; DM; depression/anxiety; and chronic pain presenting with lethargy, AMS. She was previously admitted from 12/14-19 with AKI, thought to be due to hyperglycemia from uncontrolled DM (A1c 11.9). She was also thought to have an acute UTI (no culture done) and myoclonus associated with Neurontin. The patient returns again with AMS and AKI. Her boyfriend reports that she has chronic pain syndrome and that he is concerned about how much medication she takes. She has had perseveration and been altered. She does not wear CPAP/BIPAP and he is unaware of whether she has episodes of apnea at home. He reports that she has been like this  since last night. The patient is periodically very somnolent with shallow respirations (?baseline) alternating with periods of being alert and able to answer a few questions but perseverates. She has myoclonic jerks periodically.  Hospital Course:  Acute metabolic/toxic encephalopathy -Suspected to be secondary to gabapentin toxicity in the setting of acute renal failure also with hyperglycemia -Patient was noted to have myoclonus -Neurology as well as nephrology consulted and appreciated -All home medications were held initially- have restarted some at smaller doses and monitoring closely -mental status currently stable, AAOx3 -sepsis ruled out  Acute respiratory insufficiency with hypoxia/history of sleep apnea -patient does not use CPAP at home -She may benefit from sleep apnea testing as well as CPAP, outpatient pulmonology referral -Currently on room air  Acute kidney injury -Resolved -CT renal stone study without stones or hydronephrosis -Nephrology consulted and appreciated m-creatinine down to 0.89 (was 4.89 on admission) -Patient did have 6 hours of CRRT however catheter clotted and was given TPA but without improvement of flows -Given her renal and mental status have improved and no further need for CVVH or HD at this time  Chronic pain/fibromyalgia and depression -Currently on Elavil, cymbalta, fentanyl patch -Continue gabapentin 342m TID, Soma 3535mBID PRN -Discussed with nephrology, will not resume baclofen given that it is renally excreted   Hypothyroidism -Continue Synthroid  Diabetes mellitus, type II with HHS -Patient did require insulin drip however has been transitioned to 70/30 and insulin sliding scale -Continue carb modified diet -Continue home medications on discharge  Morbid obesity -BMI 61.38 -Patient will need to follow-up with PCP for lifestyle modifications  History of seizure disorder -continue keppra  Physical deconditioning -PT OT  recommending SNF -Patient states that if an SNF is not found by Monday, 04/03/2020, she  would like to go home with home health -Have discussed at length the patient does need supervision at home given her current situation.  Constipation -Have ordered bowel regimen, will continue to monitor  Consultants PCCM Nephrology Neurology  Procedures  CRRT  Discharge Exam: Vitals:   04/02/20 2120 04/03/20 0447  BP: 114/69 117/76  Pulse: 88 85  Resp: 16 (!) 22  Temp: 98.9 F (37.2 C) 98.4 F (36.9 C)  SpO2: 94% 99%     General: Well developed, chronically ill appearing, NAD  HEENT: NCAT, mucous membranes moist.  Cardiovascular: S1 S2 auscultated, RRR, no murmur  Respiratory: Clear to auscultation bilaterally   Abdomen: Soft, obese nontender, nondistended, + bowel sounds  Extremities: warm dry without cyanosis clubbing or edema  Neuro: AAOx3, nonfocal  Psych: Appropriate mood and affect  Discharge Instructions Discharge Instructions    Diet - low sodium heart healthy   Complete by: As directed    Discharge instructions   Complete by: As directed    Patient will be discharged to skilled nursing facility, continue physical and occupational therapy.  Patient will need to follow up with primary care provider within one week of discharge, discuss sleep apnea testing.  Follow-up with your pain management specialist.  Patient should continue medications as prescribed.  Patient should follow a carb modfiied diet.   Increase activity slowly   Complete by: As directed      Allergies as of 04/03/2020      Reactions   Penicillins Hives, Other (See Comments)   HAS PT DEVELOPED SEVERE RASH INVOLVING MUCUS MEMBRANES or SKIN NECROSIS: #  #  YES  #  # PATIENT HAS HAD A PCN REACTION THAT REQUIRED HOSPITALIZATION:  #  #  YES  #  #  Tolerates amoxicillin on multiple occasions per Dr. Darrel Hoover note 11/01/17.  TDD.   Clindamycin/lincomycin Hives, Dermatitis, Rash   Nsaids Other (See  Comments)   Avoid due to kidney failure caused by celebrex    Sulfa Antibiotics Hives   Versed [midazolam] Nausea And Vomiting   Pt had medication on October 16 2017 with no issues      Medication List    STOP taking these medications   ARIPiprazole 5 MG tablet Commonly known as: ABILIFY   baclofen 10 MG tablet Commonly known as: LIORESAL   esomeprazole 40 MG capsule Commonly known as: NEXIUM   furosemide 20 MG tablet Commonly known as: LASIX   lisinopril 40 MG tablet Commonly known as: ZESTRIL   NexIUM 40 MG capsule Generic drug: esomeprazole   phentermine 37.5 MG tablet Commonly known as: ADIPEX-P   topiramate 200 MG tablet Commonly known as: TOPAMAX     TAKE these medications   acetaminophen 325 MG tablet Commonly known as: TYLENOL Take 2 tablets (650 mg total) by mouth every 6 (six) hours as needed for mild pain (or Fever >/= 101). What changed:   medication strength  how much to take  when to take this  reasons to take this   amitriptyline 25 MG tablet Commonly known as: ELAVIL Take 1 tablet (25 mg total) by mouth at bedtime. What changed: how much to take   atorvastatin 20 MG tablet Commonly known as: LIPITOR Take 20 mg by mouth daily.   Biotin 10000 MCG Tabs Take 10,000 mcg by mouth daily.   carisoprodol 350 MG tablet Commonly known as: SOMA Take 1 tablet (350 mg total) by mouth 2 (two) times daily as needed (muscle spasms). What changed:   when  to take this  reasons to take this   cetirizine 10 MG tablet Commonly known as: ZYRTEC Take 10 mg by mouth daily.   Diclofenac Sodium 2 % Soln Place 2-4 g onto the skin 4 (four) times daily as needed. What changed:   how much to take  reasons to take this   DULoxetine 60 MG capsule Commonly known as: CYMBALTA Take 60 mg by mouth 2 (two) times daily.   eletriptan 40 MG tablet Commonly known as: RELPAX Take 40 mg by mouth as needed for migraine or headache (and may repeat in 2 hours if  headache persists or recurs).   fentaNYL 50 MCG/HR Commonly known as: [place] 1 patch onto the skin every 3 (three) days. Start taking on: April 04, 2020 What changed:   how much to take  when to take this   gabapentin 600 MG tablet Commonly known as: NEURONTIN Take 0.5 tablets (300 mg total) by mouth 3 (three) times daily. What changed: how much to take   hydrOXYzine 25 MG capsule Commonly known as: VISTARIL Take 25 mg by mouth 3 (three) times daily as needed for anxiety.   levETIRAcetam 750 MG tablet Commonly known as: KEPPRA Take 750 mg by mouth 2 (two) times daily.   levothyroxine 150 MCG tablet Commonly known as: SYNTHROID Take 150 mcg by mouth daily before breakfast.   lidocaine 5 % Commonly known as: LIDODERM Place 3 patches onto the skin See admin instructions. Place 3 patches daily to affected areas and replace every 24 hours   Narcan 4 MG/0.1ML Liqd nasal spray kit Generic drug: naloxone Place 4 mg into the nose daily as needed (opioid overdose).   NovoLOG Mix 70/30 FlexPen (70-30) 100 UNIT/ML FlexPen Generic drug: insulin aspart protamine - aspart Inject 75 Units into the skin 2 (two) times daily.   nystatin powder Generic drug: nystatin Apply 1 g topically daily as needed (to affected areas).   Omega-3 1000 MG Caps Take 1,000 mg by mouth 2 (two) times daily.   Oxycodone HCl 10 MG Tabs Take 1 tablet (10 mg total) by mouth 4 (four) times daily as needed (for pain).   pantoprazole 40 MG tablet Commonly known as: PROTONIX Take 1 tablet (40 mg total) by mouth daily. Start taking on: April 04, 2020   promethazine 25 MG tablet Commonly known as: PHENERGAN Take 25 mg by mouth every 6 (six) hours as needed for nausea or vomiting.   senna 8.6 MG Tabs tablet Commonly known as: SENOKOT Take 8.6 mg by mouth daily as needed for mild constipation.   sitaGLIPtin 50 MG tablet Commonly known as: JANUVIA Take 50 mg by mouth daily.   Trulicity  1.5 LI/1.0VU Sopn Generic drug: Dulaglutide Inject 3 mg into the skin every Thursday.   Vitamin Deficiency System-B12 1000 MCG/ML Kit Generic drug: Cyanocobalamin Inject 1,000 mcg as directed every 30 (thirty) days.      Allergies  Allergen Reactions   Penicillins Hives and Other (See Comments)    HAS PT DEVELOPED SEVERE RASH INVOLVING MUCUS MEMBRANES or SKIN NECROSIS: #  #  YES  #  # PATIENT HAS HAD A PCN REACTION THAT REQUIRED HOSPITALIZATION:  #  #  YES  #  #   Tolerates amoxicillin on multiple occasions per Dr. Darrel Hoover note 11/01/17.  TDD.   Clindamycin/Lincomycin Hives, Dermatitis and Rash   Nsaids Other (See Comments)    Avoid due to kidney failure caused by celebrex    Sulfa Antibiotics Hives  Versed [Midazolam] Nausea And Vomiting    Pt had medication on October 16 2017 with no issues    Follow-up Information    Regan, Ripley Fraise, NP. Schedule an appointment as soon as possible for a visit in 1 week(s).   Specialty: Adult Health Nurse Practitioner Why: Hospital follow up Contact information: [address] [place] [address] [phone]                The results of significant diagnostics from this hospitalization (including imaging, microbiology, ancillary and laboratory) are listed below for reference.    Significant Diagnostic Studies: EEG  Result Date: 03/26/2020 Lora Havens, MD     03/26/2020  8:55 PM Patient Name: [name] MRN: [id number] Epilepsy Attending: Lora Havens Referring Physician/Provider: Dr Kerney Elbe Date: 03/26/2020 Duration: 23.50 mins Patient history: 50 year old female with a history of seizures, on Keppra, presenting with acute encephalopathy and tremor-like movements in the setting of AKI and polypharmacy. EEG to evaluate for seizure. Level of alertness: Awake, asleep AEDs during EEG study: LEV Technical aspects: This EEG study was done with scalp electrodes positioned according to the 10-20  International system of electrode placement. Electrical activity was acquired at a sampling rate of '500Hz'  and reviewed with a high frequency filter of '70Hz'  and a low frequency filter of '1Hz' . EEG data were recorded continuously and digitally stored. Description: No posterior dominant rhythm was seen. Sleep was characterized by vertex waves, sleep spindles (12 to 14 Hz), maximal frontocentral region.  EEG showed continuous generalized 3 to 6 Hz theta-delta slowing. Multiple episodes of brief whole body twitching were recorded. Concomitant eeg before, during and after the event didn't show any eeg change to suggest seizure.   Hyperventilation and photic stimulation were not performed.   ABNORMALITY -Continuous slow, generalized IMPRESSION: This study is suggestive of moderate diffuse encephalopathy, nonspecific etiology. No seizures or epileptiform discharges were seen throughout the recording. Multiple episodes of brief whole body twitching were recorded without concomitant eeg change and were NOT epileptic. Lora Havens   EEG  Result Date: 03/09/2020 Lora Havens, MD     03/09/2020  9:04 AM Patient Name: [name] MRN: [id number] Epilepsy Attending: Lora Havens Referring Physician/Provider:  Dr Lesleigh Noe Date: 03/08/2020 Duration: 27.32 mins Patient history: 50 year old female with history of seizures who presented with increasing resting tremors and jerks.  EEG to evaluate for seizures. Level of alertness: Awake AEDs during EEG study: Keppra, Topamax, gabapentin Technical aspects: This EEG study was done with scalp electrodes positioned according to the 10-20 International system of electrode placement. Electrical activity was acquired at a sampling rate of '500Hz'  and reviewed with a high frequency filter of '70Hz'  and a low frequency filter of '1Hz' . EEG data were recorded continuously and digitally stored. Description: The posterior dominant rhythm consists of 7.5 Hz activity of moderate  voltage (25-35 uV) seen predominantly in posterior head regions, symmetric and reactive to eye opening and eye closing. EEG showed continuous generalized 5 to 6 Hz theta as well as intermittent generalized 2 to 3 Hz delta slowing. Physiologic photic driving was not seen during photic stimulation.  Hyperventilation was not performed.   ABNORMALITY -Continuous slow, generalized IMPRESSION: This study is suggestive of mild diffuse encephalopathy, nonspecific etiology. No seizures or epileptiform discharges were seen throughout the recording. Lora Havens   DG Chest 2 View  Result Date: 03/07/2020 CLINICAL DATA:  Shortness of breath EXAM: CHEST -  2 VIEW COMPARISON:  03/24/2019 FINDINGS: Shallow lung inflation with mild interstitial opacity, unchanged. No focal consolidation. Normal pleural spaces and cardiomediastinal contours. IMPRESSION: Shallow lung inflation without focal airspace disease. Electronically Signed   By: Ulyses Jarred M.D.   On: 03/07/2020 03:30   CT HEAD WO CONTRAST  Result Date: 03/26/2020 CLINICAL DATA:  Delirium. EXAM: CT HEAD WITHOUT CONTRAST TECHNIQUE: Contiguous axial images were obtained from the base of the skull through the vertex without intravenous contrast. COMPARISON:  March 01, 2011 FINDINGS: Brain: No evidence of acute infarction, hemorrhage, hydrocephalus, extra-axial collection or mass lesion/mass effect. Vascular: No hyperdense vessel is noted. Skull: Defect in parietal bone over convexity stable compared to prior exam Sinuses/Orbits: No acute finding. Other: None. IMPRESSION: No focal acute intracranial abnormality identified. Electronically Signed   By: Abelardo Diesel M.D.   On: 03/26/2020 10:58   MR BRAIN WO CONTRAST  Result Date: 03/09/2020 CLINICAL DATA:  Seizure. EXAM: MRI HEAD WITHOUT CONTRAST TECHNIQUE: Multiplanar, multiecho pulse sequences of the brain and surrounding structures were obtained without intravenous contrast. COMPARISON:  CT head 03/01/2011  FINDINGS: Brain: Ventricle size and cerebral volume normal. Negative for acute infarct, hemorrhage, mass. Normal white matter. Mesial temporal lobe normal in signal and volume bilaterally. Vascular: Normal arterial flow voids. 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 There appears to be prior cranioplasty in the posterior parietal lobe in the midline. Sinuses/Orbits: Paranasal sinuses clear.  Negative orbit Other: None IMPRESSION: No significant intracranial abnormality. Scaphocephaly. Electronically Signed   By: Franchot Gallo M.D.   On: 03/09/2020 11:23   DG CHEST PORT 1 VIEW  Result Date: 03/27/2020 CLINICAL DATA:  Central venous catheter placement EXAM: PORTABLE CHEST 1 VIEW COMPARISON:  03/26/2020 FINDINGS: Right internal jugular hemodialysis catheter is seen with its tip overlying the superior vena cava. Lung volumes are small but are symmetric and are clear. No pneumothorax or pleural effusion. Mild cardiomegaly is stable. No acute bone abnormality. IMPRESSION: Right internal jugular hemodialysis catheter tip within the superior vena cava. No pneumothorax. Stable pulmonary hypoinflation. Electronically Signed   By: Fidela Salisbury MD   On: 03/27/2020 01:36   DG Chest Port 1 View  Result Date: 03/26/2020 CLINICAL DATA:  Sepsis EXAM: PORTABLE CHEST 1 VIEW COMPARISON:  None. FINDINGS: The heart size and mediastinal contours are within normal limits. Overall shallow degree of aeration. Both lungs are clear. The visualized skeletal structures are unremarkable. IMPRESSION: No active disease.  Shallow degree of aeration. Electronically Signed   By: Prudencio Pair M.D.   On: 03/26/2020 01:50   CT RENAL STONE STUDY  Result Date: 03/26/2020 CLINICAL DATA:  Psych pain, fever, dyspnea and confusion. EXAM: CT ABDOMEN AND PELVIS WITHOUT CONTRAST TECHNIQUE: Multidetector CT imaging of the abdomen and pelvis was performed following the standard protocol without IV  contrast. COMPARISON:  10/28/2017 CT abdomen/pelvis. FINDINGS: Lower chest: No significant pulmonary nodules or acute consolidative airspace disease. Mildly enlarged 1.2 cm right subcarinal node (series 3/image 10), stable since 2014 CT, most compatible with benign reactive etiology. Hepatobiliary: Normal liver size. No liver mass. Cholecystectomy. No biliary ductal dilatation. Pancreas: Normal, with no mass or duct dilation. Spleen: Normal size. No mass. Adrenals/Urinary Tract: Normal adrenals. No hydronephrosis. No renal stones. No contour deforming renal masses. Normal caliber ureters. No ureteral stones. Normal nondistended bladder. Stomach/Bowel: Normal non-distended stomach. Normal caliber small bowel with no small bowel wall thickening. Normal appendix. Normal large bowel with no diverticulosis, large bowel wall thickening or pericolonic  fat stranding. Vascular/Lymphatic: Normal caliber abdominal aorta. Stable mild porta hepatis adenopathy up to 1.1 cm (series 3/image 34). No new pathologically enlarged lymph nodes in the abdomen or pelvis. Reproductive: Grossly normal uterus. Intrauterine device is grossly well-positioned in the uterine cavity. No adnexal mass. Other: No pneumoperitoneum, ascites or focal fluid collection. Musculoskeletal: No aggressive appearing focal osseous lesions. Marked lumbar spondylosis. Stable mixed calcific and fat density 7.8 cm focus in subcutaneous medial left gluteal region, most compatible with fat necrosis. IMPRESSION: 1. No acute abnormality. No nephrolithiasis. No hydronephrosis. No evidence of bowel obstruction or acute bowel inflammation. Normal appendix. 2. Chronic stable mild porta hepatis adenopathy, probably reactive. Electronically Signed   By: Ilona Sorrel M.D.   On: 03/26/2020 04:07    Microbiology: Recent Results (from the past 240 hour(s))  Blood Culture (routine x 2)     Status: None   Collection Time: 03/26/20  1:20 AM   Specimen: BLOOD  Result Value  Ref Range Status   Specimen Description BLOOD SITE NOT SPECIFIED  Final   Special Requests   Final    BOTTLES DRAWN AEROBIC AND ANAEROBIC Blood Culture adequate volume   Culture   Final    NO GROWTH 6 DAYS Performed at [facility], [address], [place], [place] [address]    Report Status 04/01/2020 FINAL  Final  Blood Culture (routine x 2)     Status: None   Collection Time: 03/26/20  1:30 AM   Specimen: BLOOD  Result Value Ref Range Status   Specimen Description BLOOD SITE NOT SPECIFIED  Final   Special Requests   Final    BOTTLES DRAWN AEROBIC AND ANAEROBIC Blood Culture adequate volume   Culture   Final    NO GROWTH 6 DAYS Performed at [facility], [place] [address] [place], [place] [address]    Report Status 04/01/2020 FINAL  Final  Urine culture     Status: None   Collection Time: 03/26/20  2:52 AM   Specimen: In/Out Cath Urine  Result Value Ref Range Status   Specimen Description IN/OUT CATH URINE  Final   Special Requests NONE  Final   Culture   Final    NO GROWTH Performed at [facility], [place] [address], [place], [place] [address]    Report Status 03/27/2020 FINAL  Final  Resp Panel by RT-PCR (Flu A&B, Covid) Nasopharyngeal Swab     Status: None   Collection Time: 03/26/20  4:38 AM   Specimen: Nasopharyngeal Swab; Nasopharyngeal(NP) swabs in vial transport medium  Result Value Ref Range Status   SARS Coronavirus 2 by RT PCR NEGATIVE NEGATIVE Final    Comment: (NOTE) SARS-CoV-2 target nucleic acids are NOT DETECTED.  The SARS-CoV-2 RNA is generally detectable in upper respiratory specimens during the acute phase of infection. The lowest concentration of SARS-CoV-2 viral copies this assay can detect is 138 copies/mL. A negative result does not preclude SARS-Cov-2 infection and should not be used as the sole basis for treatment or other patient management decisions. A negative result may occur with  improper specimen collection/handling,  submission of specimen other than nasopharyngeal swab, presence of viral mutation(s) within the areas targeted by this assay, and inadequate number of viral copies(<138 copies/mL). A negative result must be combined with clinical observations, patient history, and epidemiological information. The expected result is Negative.  Fact Sheet for Patients:  EntrepreneurPulse.com.au  Fact Sheet for Healthcare Providers:  IncredibleEmployment.be  This test is no t  yet approved or cleared by the [place] and  has been authorized for detection and/or diagnosis of SARS-CoV-2 by FDA under an Emergency Use Authorization (EUA). This EUA will remain  in effect (meaning this test can be used) for the duration of the COVID-19 declaration under Section 564(b)(1) of the Act, 21 U.S.C.section 360bbb-3(b)(1), unless the authorization is terminated  or revoked sooner.       Influenza A by PCR NEGATIVE NEGATIVE Final   Influenza B by PCR NEGATIVE NEGATIVE Final    Comment: (NOTE) The Xpert Xpress SARS-CoV-2/FLU/RSV plus assay is intended as an aid in the diagnosis of influenza from Nasopharyngeal swab specimens and should not be used as a sole basis for treatment. Nasal washings and aspirates are unacceptable for Xpert Xpress SARS-CoV-2/FLU/RSV testing.  Fact Sheet for Patients: EntrepreneurPulse.com.au  Fact Sheet for Healthcare Providers: IncredibleEmployment.be  This test is not yet approved or cleared by the [place] FDA and has been authorized for detection and/or diagnosis of SARS-CoV-2 by FDA under an Emergency Use Authorization (EUA). This EUA will remain in effect (meaning this test can be used) for the duration of the COVID-19 declaration under Section 564(b)(1) of the Act, 21 U.S.C. section 360bbb-3(b)(1), unless the authorization is terminated or revoked.  Performed at [facility], [place] [address], [place], [place] [address]   MRSA PCR Screening     Status: None   Collection Time: 03/26/20  7:53 PM   Specimen: Nasopharyngeal  Result Value Ref Range Status   MRSA by PCR NEGATIVE NEGATIVE Final    Comment:        The GeneXpert MRSA Assay (FDA approved for NASAL specimens only), is one component of a comprehensive MRSA colonization surveillance program. It is not intended to diagnose MRSA infection nor to guide or monitor treatment for MRSA infections. Performed at [facility], [place] [address], [place], [place] [address]   Culture, Urine     Status: None   Collection Time: 03/26/20  9:12 PM   Specimen: Urine, Catheterized  Result Value Ref Range Status   Specimen Description URINE, CATHETERIZED  Final   Special Requests NONE  Final   Culture   Final    NO GROWTH Performed at [facility], [address] [place], [place] [address]    Report Status 03/28/2020 FINAL  Final  SARS CORONAVIRUS 2 (TAT 6-24 HRS) Nasopharyngeal Nasopharyngeal Swab     Status: None   Collection Time: 03/30/20 11:15 AM   Specimen: Nasopharyngeal Swab  Result Value Ref Range Status   SARS Coronavirus 2 NEGATIVE NEGATIVE Final    Comment: (NOTE) SARS-CoV-2 target nucleic acids are NOT DETECTED.  The SARS-CoV-2 RNA is generally detectable in upper and lower respiratory specimens during the acute phase of infection. Negative results do not preclude SARS-CoV-2 infection, do not rule out co-infections with other pathogens, and should not be used as the sole basis for treatment or other patient management decisions. Negative results must be combined with clinical observations, patient history, and epidemiological information. The expected result is Negative.  Fact Sheet for Patients: SugarRoll.be  Fact Sheet for Healthcare Providers: https://www.woods-mathews.com/  This test is not yet approved or cleared by the [place] FDA and   has been authorized for detection and/or diagnosis of SARS-CoV-2 by FDA under an Emergency Use Authorization (EUA). This EUA will remain  in effect (meaning this test can be used) for the duration of the COVID-19 declaration under Se ction 564(b)(1) of  the Act, 21 U.S.C. section 360bbb-3(b)(1), unless the authorization is terminated or revoked sooner.  Performed at [facility], [place] [address] [place], [place] [address]      Labs: Basic Metabolic Panel: Recent Labs  Lab 03/28/20 0434 03/29/20 0155 03/30/20 0327 03/31/20 0124 04/01/20 0431  NA 134* 136 136 136 136  K 4.3 4.7 4.7 4.5 4.4  CL 104 106 105 102 104  CO2 21* '23 23 24 24  ' GLUCOSE 135* 192* 114* 240* 297*  BUN 39* 35* 21* 18 16  CREATININE 1.80* 1.17* 0.81 0.79 0.89  CALCIUM 7.7* 8.2* 8.7* 8.9 8.8*  MG 1.7  --   --   --   --   PHOS 3.7 2.8 2.3* 2.4* 2.6   Liver Function Tests: Recent Labs  Lab 03/28/20 0434 03/29/20 0155 03/30/20 0327 03/31/20 0124 04/01/20 0431  ALBUMIN 2.6* 2.5* 2.5* 2.5* 2.5*   No results for input(s): LIPASE, AMYLASE in the last 168 hours. No results for input(s): AMMONIA in the last 168 hours. CBC: Recent Labs  Lab 03/29/20 0155 03/30/20 0327  WBC 6.3 7.0  HGB 10.4* 11.1*  HCT 31.6* 33.5*  MCV 95.8 94.9  PLT 110* 148*   Cardiac Enzymes: No results for input(s): CKTOTAL, CKMB, CKMBINDEX, TROPONINI in the last 168 hours. BNP: BNP (last 3 results) No results for input(s): BNP in the last 8760 hours.  ProBNP (last 3 results) No results for input(s): PROBNP in the last 8760 hours.  CBG: Recent Labs  Lab 04/02/20 0634 04/02/20 1120 04/02/20 1643 04/02/20 2122 04/03/20 0621  GLUCAP 211* 243* 251* 215* 231*       Signed:  Kwane Rohl  [facility] 04/03/2020, 10:51 AM

## 2020-04-03 NOTE — Progress Notes (Signed)
Pt refused CPAP for the night.   

## 2020-04-03 NOTE — Care Management Important Message (Signed)
Important Message  Patient Details  Name: [name] MRN: [id number] Date of Birth: 07-Jul-1970   Medicare Important Message Given:  Yes     Shelda Altes 04/03/2020, 11:58 AM

## 2020-04-03 NOTE — Progress Notes (Signed)
D/C instructions printed and placed in packet at nurse's station along with signed narcotic script. Waiting for covid test results.

## 2020-04-03 NOTE — Plan of Care (Signed)
  Problem: Education: Goal: Knowledge of General Education information will improve Description: Including pain rating scale, medication(s)/side effects and non-pharmacologic comfort measures Outcome: Adequate for Discharge   Problem: Health Behavior/Discharge Planning: Goal: Ability to manage health-related needs will improve Outcome: Adequate for Discharge   Problem: Clinical Measurements: Goal: Ability to maintain clinical measurements within normal limits will improve Outcome: Adequate for Discharge Goal: Will remain free from infection Outcome: Adequate for Discharge Goal: Diagnostic test results will improve Outcome: Adequate for Discharge Goal: Respiratory complications will improve Outcome: Adequate for Discharge Goal: Cardiovascular complication will be avoided Outcome: Adequate for Discharge   Problem: Activity: Goal: Risk for activity intolerance will decrease Outcome: Adequate for Discharge   Problem: Nutrition: Goal: Adequate nutrition will be maintained Outcome: Adequate for Discharge   Problem: Coping: Goal: Level of anxiety will decrease Outcome: Adequate for Discharge   Problem: Elimination: Goal: Will not experience complications related to bowel motility Outcome: Adequate for Discharge Goal: Will not experience complications related to urinary retention Outcome: Adequate for Discharge   Problem: Pain Managment: Goal: General experience of comfort will improve Outcome: Adequate for Discharge   Problem: Safety: Goal: Ability to remain free from injury will improve Outcome: Adequate for Discharge   Problem: Skin Integrity: Goal: Risk for impaired skin integrity will decrease Outcome: Adequate for Discharge   Problem: Education: Goal: Knowledge of disease and its progression will improve Outcome: Adequate for Discharge   Problem: Health Behavior/Discharge Planning: Goal: Ability to manage health-related needs will improve Outcome: Adequate for  Discharge   Problem: Clinical Measurements: Goal: Complications related to the disease process or treatment will be avoided or minimized Outcome: Adequate for Discharge Goal: Dialysis access will remain free of complications Outcome: Adequate for Discharge   Problem: Activity: Goal: Activity intolerance will improve Outcome: Adequate for Discharge   Problem: Fluid Volume: Goal: Fluid volume balance will be maintained or improved Outcome: Adequate for Discharge   Problem: Nutritional: Goal: Ability to make appropriate dietary choices will improve Outcome: Adequate for Discharge   Problem: Respiratory: Goal: Respiratory symptoms related to disease process will be avoided Outcome: Adequate for Discharge   Problem: Self-Concept: Goal: Body image disturbance will be avoided or minimized Outcome: Adequate for Discharge   Problem: Urinary Elimination: Goal: Progression of disease will be identified and treated Outcome: Adequate for Discharge   Problem: Education: Goal: Ability to describe self-care measures that may prevent or decrease complications (Diabetes Survival Skills Education) will improve Outcome: Adequate for Discharge Goal: Individualized Educational Video(s) Outcome: Adequate for Discharge   Problem: Coping: Goal: Ability to adjust to condition or change in health will improve Outcome: Adequate for Discharge   Problem: Fluid Volume: Goal: Ability to maintain a balanced intake and output will improve Outcome: Adequate for Discharge   Problem: Health Behavior/Discharge Planning: Goal: Ability to identify and utilize available resources and services will improve Outcome: Adequate for Discharge Goal: Ability to manage health-related needs will improve Outcome: Adequate for Discharge   Problem: Metabolic: Goal: Ability to maintain appropriate glucose levels will improve Outcome: Adequate for Discharge   Problem: Nutritional: Goal: Maintenance of adequate  nutrition will improve Outcome: Adequate for Discharge Goal: Progress toward achieving an optimal weight will improve Outcome: Adequate for Discharge   Problem: Skin Integrity: Goal: Risk for impaired skin integrity will decrease Outcome: Adequate for Discharge   Problem: Tissue Perfusion: Goal: Adequacy of tissue perfusion will improve Outcome: Adequate for Discharge

## 2020-04-03 NOTE — Discharge Instructions (Signed)
Acute Kidney Injury, Adult  Acute kidney injury is a sudden worsening of kidney function. The kidneys are organs that have several jobs. They filter the blood to remove waste products and extra fluid. They also maintain a healthy balance of minerals and hormones in the body, which helps control blood pressure and keep bones strong. With this condition, your kidneys do not do their jobs as well as they should. This condition ranges from mild to severe. Over time, it may develop into long-lasting (chronic) kidney disease. Early detection and treatment may prevent acute kidney injury from developing into a chronic condition. What are the causes? Common causes of this condition include:  A problem with blood flow to the kidneys. This may be caused by: ? Low blood pressure (hypotension) or shock. ? Blood loss. ? Heart and blood vessel (cardiovascular) disease. ? Severe burns. ? Liver disease.  Direct damage to the kidneys. This may be caused by: ? Certain medicines. ? A kidney infection. ? Poisoning. ? Being around or in contact with toxic substances. ? A surgical wound. ? A hard, direct hit to the kidney area.  A sudden blockage of urine flow. This may be caused by: ? Cancer. ? Kidney stones. ? An enlarged prostate in males. What increases the risk? You are more likely to develop this condition if you:  Are older than age 65.  Are female.  Are hospitalized, especially if you are in critical condition.  Have certain conditions, such as: ? Chronic kidney disease. ? Diabetes. ? Coronary artery disease and heart failure. ? Pulmonary disease. ? Chronic liver disease. What are the signs or symptoms? Symptoms of this condition may not be obvious until the condition becomes severe. Symptoms of this condition can include:  Tiredness (lethargy) or difficulty staying awake.  Nausea or vomiting.  Swelling (edema) of the face, legs, ankles, or feet.  Problems with urination, such  as: ? Pain in the abdomen, or pain along the side of your stomach (flank). ? Producing little or no urine. ? Passing urine with a weak flow.  Muscle twitches and cramps, especially in the legs.  Confusion or trouble concentrating.  Loss of appetite.  Fever. How is this diagnosed? Your health care provider can diagnose this condition based on your symptoms, medical history, and a physical exam.  You may also have other tests, such as:  Blood tests.  Urine tests.  Imaging tests.  A test in which a sample of tissue is removed from the kidneys to be examined under a microscope (kidney biopsy). How is this treated? Treatment for this condition depends on the cause and how severe the condition is. In mild cases, treatment may not be needed. The kidneys may heal on their own. In more severe cases, treatment will involve:  Treating the cause of the kidney injury. This may involve changing any medicines you are taking or adjusting your dosage.  Fluids. You may need specialized IV fluids to balance your body's needs.  Having a catheter placed to drain urine and prevent blockages.  Preventing problems from occurring. This may mean avoiding certain medicines or procedures that can cause further injury to the kidneys. In some cases, treatment may also require:  A procedure to remove toxic wastes from the body (dialysis or continuous renal replacement therapy, CRRT).  Surgery. This may be done to repair a torn kidney or to remove the blockage from the urinary system. Follow these instructions at home: Medicines  Take over-the-counter and prescription medicines only as   told by your health care provider.  Do not take any new medicines without your health care provider's approval. Many medicines can worsen your kidney damage.  Do not take any vitamin and mineral supplements without your health care provider's approval. Many nutritional supplements can worsen your kidney  damage. Lifestyle  If your health care provider prescribed changes to your diet, follow them. You may need to decrease the amount of protein you eat.  Achieve and maintain a healthy weight. If you need help with this, ask your health care provider.  Start or continue an exercise plan. Try to exercise at least 30 minutes a day, 5 days a week.  Do not use any products that contain nicotine or tobacco, such as cigarettes, e-cigarettes, and chewing tobacco. If you need help quitting, ask your health care provider.   General instructions  Keep track of your blood pressure. Report changes in your blood pressure as told by your health care provider.  Stay up to date with your vaccines. Ask your health care provider which vaccines you need.  Keep all follow-up visits as told by your health care provider. This is important.   Where to find more information  American Association of Kidney Patients: www.aakp.org  National Kidney Foundation: www.kidney.org  American Kidney Fund: www.akfinc.org  Life Options Rehabilitation Program: ? www.lifeoptions.org ? www.kidneyschool.org Contact a health care provider if:  Your symptoms get worse.  You develop new symptoms. Get help right away if:  You develop symptoms of worsening kidney disease, which include: ? Headaches. ? Abnormally dark or light skin. ? Easy bruising. ? Frequent hiccups. ? Chest pain. ? Shortness of breath. ? End of menstruation in women. ? Seizures. ? Confusion or altered mental status. ? Abdominal or back pain. ? Itchiness.  You have a fever.  Your body is producing less urine.  You have pain or bleeding when you urinate. Summary  Acute kidney injury is a sudden worsening of kidney function.  Acute kidney injury can be caused by problems with blood flow to the kidneys, direct damage to the kidneys, and sudden blockage of urine flow.  Symptoms of this condition may not be obvious until it becomes severe.  Symptoms may include edema, lethargy, confusion, nausea or vomiting, and problems passing urine.  This condition can be diagnosed with blood tests, urine tests, and imaging tests. Sometimes a kidney biopsy is done to diagnose this condition.  Treatment for this condition often involves treating the underlying cause. It is treated with fluids, medicines, diet changes, dialysis, or surgery. This information is not intended to replace advice given to you by your health care provider. Make sure you discuss any questions you have with your health care provider. Document Revised: 01/19/2019 Document Reviewed: 01/19/2019 Elsevier Patient Education  2021 Elsevier Inc.  

## 2020-05-18 ENCOUNTER — Ambulatory Visit: Payer: Medicare HMO | Admitting: Orthopaedic Surgery

## 2020-05-31 ENCOUNTER — Encounter: Payer: Self-pay | Admitting: Orthopaedic Surgery

## 2020-05-31 ENCOUNTER — Ambulatory Visit: Payer: Medicare HMO | Admitting: Orthopaedic Surgery

## 2020-05-31 DIAGNOSIS — M170 Bilateral primary osteoarthritis of knee: Secondary | ICD-10-CM | POA: Diagnosis not present

## 2020-05-31 DIAGNOSIS — M1712 Unilateral primary osteoarthritis, left knee: Secondary | ICD-10-CM

## 2020-05-31 DIAGNOSIS — G8929 Other chronic pain: Secondary | ICD-10-CM

## 2020-05-31 DIAGNOSIS — M25561 Pain in right knee: Secondary | ICD-10-CM

## 2020-05-31 DIAGNOSIS — M25562 Pain in left knee: Secondary | ICD-10-CM | POA: Diagnosis not present

## 2020-05-31 DIAGNOSIS — M1711 Unilateral primary osteoarthritis, right knee: Secondary | ICD-10-CM

## 2020-05-31 NOTE — Progress Notes (Signed)
Office Visit Note   Patient: [name]           Date of Birth: 05/05/1970           MRN: [id number] Visit Date: 05/31/2020              Requested by: Pecolia Ades, NP No address on file PCP: Pecolia Ades, NP   Assessment & Plan: Visit Diagnoses:  1. Chronic pain of left knee   2. Chronic pain of right knee   3. Unilateral primary osteoarthritis, right knee   4. Unilateral primary osteoarthritis, left knee     Plan: I counseled her extensively about the risk and benefits of steroid injections and then placed 1 in each knee without difficulty.  We talked about weight loss and activity modification.  She knows to wait at least 4 months between injections if possible.  Follow-Up Instructions: Return if symptoms worsen or fail to improve.   Orders:  No orders of the defined types were placed in this encounter.  No orders of the defined types were placed in this encounter.     Procedures: No procedures performed   Clinical Data: No additional findings.   Subjective: Chief Complaint  Patient presents with  . Right Knee - Pain  . Left Knee - Pain  The patient is well-known to me.  She has chronic bilateral knee pain and known end-stage arthritis of the severe both knees.  She has significant comorbidities including morbid obesity and poorly controlled diabetes.  She has been hospitalized once in December and once in January with other issues.  I was able to review all of those records from those 2 hospitalizations.  It has been 4 months since we put steroid injections in her knees.  That is really the only option that we have at this point with her weight and poorly controlled diabetes that her prohibiting [name] from ever considering knee replacement surgery.  She also understands that blood glucose can be significantly elevated with steroid injections as well.  HPI  Review of Systems Today she denies any headache, chest pain, shortness of breath, fever,  chills, nausea, vomiting  Objective: Vital Signs: There were no vitals taken for this visit.  Physical Exam She is alert and orient x3 and in no acute distress she ambulates using a cane Ortho Exam .  Both knees have severe medial and lateral joint line tenderness and significant patellofemoral crepitation. Specialty Comments:  No specialty comments available.  Imaging: No results found.   PMFS History: Patient Active Problem List   Diagnosis Date Noted  . Acute renal failure (ARF) ([place]) 03/26/2020  . Acute metabolic encephalopathy 34/19/6222  . 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 AKI (acute kidney injury) ([place]) 03/07/2020  . Seizures ([place]) 03/07/2020  . Acute lower UTI 03/07/2020  . Tremor 03/07/2020  . History of 2019 novel coronavirus disease (COVID-19) 03/25/2019  . Depression with anxiety 03/25/2019  . Chest pain 03/24/2019  . Postoperative abscess 10/27/2017  . Umbilical hernia, incarcerated, s/p repair 10/16/2017 10/16/2017  . Chronic pain of both knees 04/23/2017  . Unilateral primary osteoarthritis, left knee 10/24/2016  . Unilateral primary osteoarthritis, right knee 10/24/2016  . Abnormal mammogram with microcalcification-Right inner lower quadrant 07/21/2013  . Oliguria and anuria 09/02/2012  . Constipation 03/02/2011  . Lethargy 03/01/2011  . Heel ulcer due to DM ([place]) 02/19/2011  . Wound, open, hip or thigh 02/19/2011  . Healing pressure ulcer stage III ([place]) 02/18/2011  .  Morbidly obese ([place]) 02/18/2011  . Diabetes mellitus type 2 with complications, uncontrolled ([place]) 02/18/2011  . Sleep apnea 02/18/2011  . Hypothyroidism 02/18/2011  . Chronic pain 02/18/2011  . Acute lymphangitis 02/18/2011  . Cellulitis of foot 02/18/2011   Past Medical History:  Diagnosis Date  . Acute renal failure (ARF) ([place]) 09/02/2012  . Anemia   . Anxiety    panic attacks  . Back pain   . Blood transfusion   . Chronic heel ulcer ([place])   . Chronic kidney disease   .  Chronic pain syndrome   . Depression   . Diabetes mellitus    type II   . DJD (degenerative joint disease)   . Dysrhythmia    pt unsure what this was  . Fibromyalgia   . GERD (gastroesophageal reflux disease)   . H/O: Bell's palsy 2011  . Heart murmur    "slight one"  . History of kidney stones   . History of MRSA infection OF ULCER  . Hypertension    no longer taken since 10-11 months per patient at preop phone call of 10/13/2017   . Hypothyroidism   . Hypoventilation associated with obesity syndrome ([place])   . Migraine   . Non-healing non-surgical wound    Right hip, has Wound vac to hip.  Started as a skin tear.  . Peripheral vascular disease ([place])   . PONV (postoperative nausea and vomiting)    can be slow to wake up after surgery  . Right foot drop   . Shortness of breath    with Activity  . Sleep apnea    "study shows not bad enough for CPAP.", pt denies  . Umbilical hernia     Family History  Problem Relation Age of Onset  . Diabetes type II Father   . Heart attack Father   . Peripheral vascular disease Father   . Diabetes type II Mother   . Anesthesia problems Mother   . Heart attack Mother   . Peripheral vascular disease Mother   . Hypertension Mother     Past Surgical History:  Procedure Laterality Date  . BACK SURGERY     for lumbar disc disease X2  . 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 BRAIN SURGERY      Plating due to soft spot closing too early- age 29  . BREAST LUMPECTOMY WITH NEEDLE LOCALIZATION Right 08/23/2013   Procedure: RIGHT BREAST LUMPECTOMY WITH NEEDLE LOCALIZATION;  Surgeon: Odis Hollingshead, MD;  Location: [facility];  Service: General;  Laterality: Right;  . CHOLECYSTECTOMY  1984  . EXCISION UMBILICAL NODULE N/A 11/21/5619   Procedure: EXCISION OF UMBILICUS;  Surgeon: Coralie Keens, MD;  Location: [facility];  Service: General;  Laterality: N/A;  . EYE SURGERY     eye lift  . I & D EXTREMITY  04/02/2011   Procedure:  IRRIGATION AND DEBRIDEMENT EXTREMITY;  Surgeon: Mcarthur Rossetti;  Location: [facility];  Service: Orthopedics;  Laterality: Right;  I&D right heel ulcer, placement of A-cell graft  . INCISION AND DRAINAGE ABSCESS N/A 10/28/2017   Procedure: INCISION AND DRAINAGE UMBILICAL HERNIA ABSCESS;  Surgeon: Ralene Ok, MD;  Location: [facility];  Service: General;  Laterality: N/A;  . INCISION AND DRAINAGE OF WOUND  08/29/2011   Procedure: IRRIGATION AND DEBRIDEMENT WOUND;  Surgeon: Theodoro Kos, DO;  Location: [facility];  Service: Plastics;  Laterality: Right;  . INSERTION OF MESH N/A 10/16/2017   Procedure: INSERTION  OF MESH;  Surgeon: Coralie Keens, MD;  Location: [facility];  Service: General;  Laterality: N/A;  . LITHOTRIPSY     2007ish  . right elbow    . UMBILICAL HERNIA REPAIR N/A 10/16/2017   Procedure: UMBILICAL HERNIA REPAIR WITH MESH;  Surgeon: Coralie Keens, MD;  Location: [facility];  Service: General;  Laterality: N/A;   Social History   Occupational History  . Not on file  Tobacco Use  . Smoking status: Never Smoker  . Smokeless tobacco: Never Used  Vaping Use  . Vaping Use: Never used  Substance and Sexual Activity  . Alcohol use: No  . Drug use: No  . Sexual activity: Never

## 2020-09-07 ENCOUNTER — Encounter: Payer: Self-pay | Admitting: Orthopaedic Surgery

## 2020-09-07 ENCOUNTER — Ambulatory Visit: Payer: Medicare HMO | Admitting: Orthopaedic Surgery

## 2020-09-07 DIAGNOSIS — M1711 Unilateral primary osteoarthritis, right knee: Secondary | ICD-10-CM

## 2020-09-07 DIAGNOSIS — M25561 Pain in right knee: Secondary | ICD-10-CM | POA: Diagnosis not present

## 2020-09-07 DIAGNOSIS — M25562 Pain in left knee: Secondary | ICD-10-CM | POA: Diagnosis not present

## 2020-09-07 DIAGNOSIS — G8929 Other chronic pain: Secondary | ICD-10-CM

## 2020-09-07 DIAGNOSIS — M1712 Unilateral primary osteoarthritis, left knee: Secondary | ICD-10-CM

## 2020-09-07 MED ORDER — LIDOCAINE HCL 1 % IJ SOLN
3.0000 mL | INTRAMUSCULAR | Status: AC | PRN
  Administered 2020-09-07: 3 mL

## 2020-09-07 MED ORDER — METHYLPREDNISOLONE ACETATE 40 MG/ML IJ SUSP
40.0000 mg | INTRAMUSCULAR | Status: AC | PRN
  Administered 2020-09-07: 40 mg via INTRA_ARTICULAR

## 2020-09-07 NOTE — Progress Notes (Signed)
Office Visit Note   Patient: [name]           Date of Birth: 08/28/1970           MRN: [id number] Visit Date: 09/07/2020              Requested by: Pecolia Ades, NP No address on file PCP: Pecolia Ades, NP   Assessment & Plan: Visit Diagnoses:  1. Chronic pain of left knee   2. Chronic pain of right knee   3. Unilateral primary osteoarthritis, right knee   4. Unilateral primary osteoarthritis, left knee     Plan: She understands that continued steroid injections in both knees can still elevate her blood glucose.  I did provide steroid injections in both knees without difficulty.  We can do this again in 3 months if needed.  Follow-Up Instructions: Return in about 3 months (around 12/08/2020).   Orders:  Orders Placed This Encounter  Procedures   Large Joint Inj   Large Joint Inj   No orders of the defined types were placed in this encounter.     Procedures: Large Joint Inj: R knee on 09/07/2020 4:08 PM Indications: diagnostic evaluation and pain Details: 22 G 1.5 in needle, superolateral approach  Arthrogram: No  Medications: 3 mL lidocaine 1 %; 40 mg methylPREDNISolone acetate 40 MG/ML Outcome: tolerated well, no immediate complications Procedure, treatment alternatives, risks and benefits explained, specific risks discussed. Consent was given by the patient. Immediately prior to procedure a time out was called to verify the correct patient, procedure, equipment, support staff and site/side marked as required. Patient was prepped and draped in the usual sterile fashion.    Large Joint Inj: L knee on 09/07/2020 4:09 PM Indications: diagnostic evaluation and pain Details: 22 G 1.5 in needle, superolateral approach  Arthrogram: No  Medications: 3 mL lidocaine 1 %; 40 mg methylPREDNISolone acetate 40 MG/ML Outcome: tolerated well, no immediate complications Procedure, treatment alternatives, risks and benefits explained, specific risks  discussed. Consent was given by the patient. Immediately prior to procedure a time out was called to verify the correct patient, procedure, equipment, support staff and site/side marked as required. Patient was prepped and draped in the usual sterile fashion.      Clinical Data: No additional findings.   Subjective: Chief Complaint  Patient presents with   Left Knee - Follow-up   Right Knee - Follow-up  The patient comes in today for steroid injections in both her knees.  She has terrible end-stage arthritis of both knees but is not a surgical candidate due to comorbidities combined with morbid obesity.  She reports that she has lost weight and her kidney function is better and her blood glucose is under better control.  Those have been issues in the past.  HPI  Review of Systems Today she denies any headache, chest pain, shortness of breath, fever, chills, nausea, vomiting  Objective: Vital Signs: There were no vitals taken for this visit.  Physical Exam She is alert and oriented x3 and in no acute distress Ortho Exam Examination of both knees shows painful arc of motion of both knees with a large soft tissue envelope around both knees. Specialty Comments:  No specialty comments available.  Imaging: No results found.   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   AKI (  acute kidney injury) ([place]) 03/07/2020   Seizures ([place]) 03/07/2020   Acute lower UTI 03/07/2020   Tremor 03/07/2020   History of 2019 novel coronavirus disease (COVID-19) 03/25/2019   Depression with anxiety 03/25/2019   Chest pain 03/24/2019   Postoperative abscess 82/99/3716   Umbilical hernia, incarcerated, s/p repair 10/16/2017 10/16/2017   Chronic pain of both knees 04/23/2017   Unilateral primary osteoarthritis, left knee 10/24/2016   Unilateral  primary osteoarthritis, right knee 10/24/2016   Abnormal mammogram with microcalcification-Right inner lower quadrant 07/21/2013   Oliguria and anuria 09/02/2012   Constipation 03/02/2011   Lethargy 03/01/2011   Heel ulcer due to DM ([place]) 02/19/2011   Wound, open, hip or thigh 02/19/2011   Healing pressure ulcer stage III ([place]) 02/18/2011   Morbidly obese ([place]) 02/18/2011   Diabetes mellitus type 2 with complications, uncontrolled ([place]) 02/18/2011   Sleep apnea 02/18/2011   Hypothyroidism 02/18/2011   Chronic pain 02/18/2011   Acute lymphangitis 02/18/2011   Cellulitis of foot 02/18/2011   Past Medical History:  Diagnosis Date   Acute renal failure (ARF) ([place]) 09/02/2012   Anemia    Anxiety    panic attacks   Back pain    Blood transfusion    Chronic heel ulcer ([place])    Chronic kidney disease    Chronic pain syndrome    Depression    Diabetes mellitus    type II    DJD (degenerative joint disease)    Dysrhythmia    pt unsure what this was   Fibromyalgia    GERD (gastroesophageal reflux disease)    H/O: Bell's palsy 2011   Heart murmur    "slight one"   History of kidney stones    History of MRSA infection OF ULCER   Hypertension    no longer taken since 10-11 months per patient at preop phone call of 10/13/2017    Hypothyroidism    Hypoventilation associated with obesity syndrome ([place])    Migraine    Non-healing non-surgical wound    Right hip, has Wound vac to hip.  Started as a skin tear.   Peripheral vascular disease (HCC)    PONV (postoperative nausea and vomiting)    can be slow to wake up after surgery   Right foot drop    Shortness of breath    with Activity   Sleep apnea    "study shows not bad enough for CPAP.", pt denies   Umbilical hernia     Family History  Problem Relation Age of Onset   Diabetes type II Father    Heart attack Father    Peripheral vascular disease Father    Diabetes type II Mother    Anesthesia problems Mother    Heart attack  Mother    Peripheral vascular disease Mother    Hypertension Mother     Past Surgical History:  Procedure Laterality Date   BACK SURGERY     for lumbar disc disease [place]   Tumor removed- has steel plate   BRAIN SURGERY      Plating due to soft spot closing too early- age 63   BREAST LUMPECTOMY WITH NEEDLE LOCALIZATION Right 08/23/2013   Procedure: RIGHT BREAST LUMPECTOMY WITH NEEDLE LOCALIZATION;  Surgeon: Odis Hollingshead, MD;  Location: [facility];  Service: General;  Laterality: Right;   CHOLECYSTECTOMY  [id number]   EXCISION UMBILICAL NODULE N/A 9/38/1017   Procedure: EXCISION OF UMBILICUS;  Surgeon: Coralie Keens, MD;  Location:  [facility];  Service: General;  Laterality: N/A;   EYE SURGERY     eye lift   I & D EXTREMITY  04/02/2011   Procedure: IRRIGATION AND DEBRIDEMENT EXTREMITY;  Surgeon: Mcarthur Rossetti;  Location: [facility];  Service: Orthopedics;  Laterality: Right;  I&D right heel ulcer, placement of A-cell graft   INCISION AND DRAINAGE ABSCESS N/A 10/28/2017   Procedure: INCISION AND DRAINAGE UMBILICAL HERNIA ABSCESS;  Surgeon: Ralene Ok, MD;  Location: [facility];  Service: General;  Laterality: N/A;   INCISION AND DRAINAGE OF WOUND  08/29/2011   Procedure: IRRIGATION AND DEBRIDEMENT WOUND;  Surgeon: Theodoro Kos, DO;  Location: [facility];  Service: Plastics;  Laterality: Right;   INSERTION OF MESH N/A 10/16/2017   Procedure: INSERTION OF MESH;  Surgeon: Coralie Keens, MD;  Location: [facility];  Service: General;  Laterality: N/A;   LITHOTRIPSY     2007ish   right elbow     UMBILICAL HERNIA REPAIR N/A 10/16/2017   Procedure: UMBILICAL HERNIA REPAIR WITH MESH;  Surgeon: Coralie Keens, MD;  [facility]: [facility];  Service: General;  Laterality: N/A;   Social History   Occupational History   Not on file  Tobacco Use   Smoking status: Never   Smokeless tobacco: Never  Vaping Use   Vaping Use: Never used  Substance and Sexual Activity   Alcohol use: No   Drug  use: No   Sexual activity: Never

## 2020-10-20 ENCOUNTER — Other Ambulatory Visit: Payer: Self-pay | Admitting: Adult Health Nurse Practitioner

## 2020-10-20 DIAGNOSIS — Z1231 Encounter for screening mammogram for malignant neoplasm of breast: Secondary | ICD-10-CM

## 2020-12-07 ENCOUNTER — Encounter: Payer: Self-pay | Admitting: Orthopaedic Surgery

## 2020-12-07 ENCOUNTER — Other Ambulatory Visit: Payer: Self-pay

## 2020-12-07 ENCOUNTER — Ambulatory Visit
Admission: RE | Admit: 2020-12-07 | Discharge: 2020-12-07 | Disposition: A | Discharge status: Home / Self Care | Payer: Medicare HMO | Source: Ambulatory Visit | Attending: Adult Health Nurse Practitioner | Admitting: Adult Health Nurse Practitioner

## 2020-12-07 ENCOUNTER — Ambulatory Visit: Payer: Medicare HMO | Admitting: Orthopaedic Surgery

## 2020-12-07 DIAGNOSIS — M25562 Pain in left knee: Secondary | ICD-10-CM

## 2020-12-07 DIAGNOSIS — G8929 Other chronic pain: Secondary | ICD-10-CM

## 2020-12-07 DIAGNOSIS — M1711 Unilateral primary osteoarthritis, right knee: Secondary | ICD-10-CM | POA: Diagnosis not present

## 2020-12-07 DIAGNOSIS — Z1231 Encounter for screening mammogram for malignant neoplasm of breast: Secondary | ICD-10-CM

## 2020-12-07 DIAGNOSIS — M25561 Pain in right knee: Secondary | ICD-10-CM

## 2020-12-07 DIAGNOSIS — M1712 Unilateral primary osteoarthritis, left knee: Secondary | ICD-10-CM

## 2020-12-07 MED ORDER — LIDOCAINE HCL 1 % IJ SOLN
3.0000 mL | INTRAMUSCULAR | Status: AC | PRN
  Administered 2020-12-07: 3 mL

## 2020-12-07 MED ORDER — METHYLPREDNISOLONE ACETATE 40 MG/ML IJ SUSP
40.0000 mg | INTRAMUSCULAR | Status: AC | PRN
Start: 2020-12-07 — End: 2020-12-07
  Administered 2020-12-07: 40 mg via INTRA_ARTICULAR

## 2020-12-07 MED ORDER — METHYLPREDNISOLONE ACETATE 40 MG/ML IJ SUSP
40.0000 mg | INTRAMUSCULAR | Status: AC | PRN
  Administered 2020-12-07: 40 mg via INTRA_ARTICULAR

## 2020-12-07 NOTE — Progress Notes (Signed)
Office Visit Note   Patient: [name]           Date of Birth: [birth year]           MRN: [id number] Visit Date: 12/07/2020              Requested by: Pecolia Ades, NP No address on file PCP: Pecolia Ades, NP   Assessment & Plan: Visit Diagnoses:  1. Chronic pain of left knee   2. Chronic pain of right knee   3. Unilateral primary osteoarthritis, right knee   4. Unilateral primary osteoarthritis, left knee     Plan: I did place a steroid injection in both knees today per her request.  We can repeat this in 3 months.  She understands this can detrimental effect her blood glucose.  At her next visit I would like a weight and BMI calculation.  Follow-Up Instructions: Return in about 3 months (around 03/08/2021).   Orders:  Orders Placed This Encounter  Procedures   Large Joint Inj   Large Joint Inj   No orders of the defined types were placed in this encounter.     Procedures: Large Joint Inj: R knee on 12/07/2020 4:24 PM Indications: diagnostic evaluation and pain Details: 22 G 1.5 in needle, superolateral approach  Arthrogram: No  Medications: 3 mL lidocaine 1 %; 40 mg methylPREDNISolone acetate 40 MG/ML Outcome: tolerated well, no immediate complications Procedure, treatment alternatives, risks and benefits explained, specific risks discussed. Consent was given by the patient. Immediately prior to procedure a time out was called to verify the correct patient, procedure, equipment, support staff and site/side marked as required. Patient was prepped and draped in the usual sterile fashion.    Large Joint Inj: L knee on 12/07/2020 4:24 PM Indications: diagnostic evaluation and pain Details: 22 G 1.5 in needle, superolateral approach  Arthrogram: No  Medications: 3 mL lidocaine 1 %; 40 mg methylPREDNISolone acetate 40 MG/ML Outcome: tolerated well, no immediate complications Procedure, treatment alternatives, risks and benefits explained,  specific risks discussed. Consent was given by the patient. Immediately prior to procedure a time out was called to verify the correct patient, procedure, equipment, support staff and site/side marked as required. Patient was prepped and draped in the usual sterile fashion.      Clinical Data: No additional findings.   Subjective: Chief Complaint  Patient presents with   Left Knee - Pain   Right Knee - Pain  The patient is well-known to me.  She has severe end-stage arthritis of both her knees.  She comes in about every 3 months for steroid injections in both knees.  She says that her blood glucose is under better control and she states her last hemoglobin A1c was 7.1.  She also states that her primary care physician said she lost about 30 pounds.  She is requesting a steroid injection of both knees today.  HPI  Review of Systems There is currently listed no headache, chest pain, shortness of breath, fever, chills, nausea, vomiting  Objective: Vital Signs: There were no vitals taken for this visit.  Physical Exam She is alert and orient x3 and in no acute distress Ortho Exam Examination of both knees shows significant limitations with range of motion and global tenderness with large soft tissue envelope around both knees. Specialty Comments:  No specialty comments available.  Imaging: No results found.   PMFS History: Patient Active Problem List   Diagnosis Date Noted  Acute renal failure (ARF) (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   AKI (acute kidney injury) ([place]) 03/07/2020   Seizures ([place]) 03/07/2020   Acute lower UTI 03/07/2020   Tremor 03/07/2020   History of 2019 novel coronavirus disease (COVID-19) 03/25/2019   Depression with anxiety 03/25/2019   Chest pain 03/24/2019   Postoperative abscess [id number]   Umbilical hernia, incarcerated, s/p repair 10/16/2017 10/16/2017   Chronic pain of both knees  04/23/2017   Unilateral primary osteoarthritis, left knee 10/24/2016   Unilateral primary osteoarthritis, right knee 10/24/2016   Abnormal mammogram with microcalcification-Right inner lower quadrant 07/21/2013   Oliguria and anuria 09/02/2012   Constipation 03/02/2011   Lethargy 03/01/2011   Heel ulcer due to DM ([place]) 02/19/2011   Wound, open, hip or thigh 02/19/2011   Healing pressure ulcer stage III ([place]) 02/18/2011   Morbidly obese ([place]) 02/18/2011   Diabetes mellitus type 2 with complications, uncontrolled ([place]) 02/18/2011   Sleep apnea 02/18/2011   Hypothyroidism 02/18/2011   Chronic pain 02/18/2011   Acute lymphangitis 02/18/2011   Cellulitis of foot 02/18/2011   Past Medical History:  Diagnosis Date   Acute renal failure (ARF) ([place]) 09/02/2012   Anemia    Anxiety    panic attacks   Back pain    Blood transfusion    Chronic heel ulcer ([place])    Chronic kidney disease    Chronic pain syndrome    Depression    Diabetes mellitus    type II    DJD (degenerative joint disease)    Dysrhythmia    pt unsure what this was   Fibromyalgia    GERD (gastroesophageal reflux disease)    H/O: Bell's palsy 2011   Heart murmur    "slight one"   History of kidney stones    History of MRSA infection OF ULCER   Hypertension    no longer taken since 10-11 months per patient at preop phone call of 10/13/2017    Hypothyroidism    Hypoventilation associated with obesity syndrome ([place])    Migraine    Non-healing non-surgical wound    Right hip, has Wound vac to hip.  Started as a skin tear.   Peripheral vascular disease (HCC)    PONV (postoperative nausea and vomiting)    can be slow to wake up after surgery   Right foot drop    Shortness of breath    with Activity   Sleep apnea    "study shows not bad enough for CPAP.", pt denies   Umbilical hernia     Family History  Problem Relation Age of Onset   Diabetes type II Father    Heart attack Father    Peripheral vascular  disease Father    Diabetes type II Mother    Anesthesia problems Mother    Heart attack Mother    Peripheral vascular disease Mother    Hypertension Mother     Past Surgical History:  Procedure Laterality Date   BACK SURGERY     for lumbar disc disease [place]   Tumor removed- has steel plate   BRAIN SURGERY      Plating due to soft spot closing too early- age 52   BREAST LUMPECTOMY WITH NEEDLE LOCALIZATION Right 08/23/2013   Procedure: RIGHT BREAST LUMPECTOMY WITH NEEDLE LOCALIZATION;  Surgeon: Odis Hollingshead, MD;  Location: [facility];  Service: General;  Laterality: Right;   CHOLECYSTECTOMY  [id number]   EXCISION UMBILICAL NODULE N/A [id number]   Procedure: EXCISION OF UMBILICUS;  Surgeon: Coralie Keens, MD;  [facility];  Service: General;  Laterality: N/A;   EYE SURGERY     eye lift   I & D EXTREMITY  04/02/2011   Procedure: IRRIGATION AND DEBRIDEMENT EXTREMITY;  Surgeon: Mcarthur Rossetti;  Location: [facility];  Service: Orthopedics;  Laterality: Right;  I&D right heel ulcer, placement of A-cell graft   INCISION AND DRAINAGE ABSCESS N/A 10/28/2017   Procedure: INCISION AND DRAINAGE UMBILICAL HERNIA ABSCESS;  Surgeon: Ralene Ok, MD;  Location: [facility];  Service: General;  Laterality: N/A;   INCISION AND DRAINAGE OF WOUND  08/29/2011   Procedure: IRRIGATION AND DEBRIDEMENT WOUND;  Surgeon: Theodoro Kos, DO;  Location: [facility];  Service: Plastics;  Laterality: Right;   INSERTION OF MESH N/A 10/16/2017   Procedure: INSERTION OF MESH;  Surgeon: Coralie Keens, MD;  [facility];  Service: General;  Laterality: N/A;   LITHOTRIPSY     2007ish   right elbow     UMBILICAL HERNIA REPAIR N/A 10/16/2017   Procedure: UMBILICAL HERNIA REPAIR WITH MESH;  Surgeon: Coralie Keens, MD;  [facility];  Service: General;  Laterality: N/A;   Social History   Occupational History   Not on file  Tobacco Use   Smoking status: Never   Smokeless tobacco: Never  Vaping  Use   Vaping Use: Never used  Substance and Sexual Activity   Alcohol use: No   Drug use: No   Sexual activity: Never

## 2020-12-08 ENCOUNTER — Ambulatory Visit: Payer: Medicare HMO

## 2020-12-26 ENCOUNTER — Other Ambulatory Visit: Payer: Self-pay

## 2020-12-26 ENCOUNTER — Encounter (HOSPITAL_COMMUNITY): Payer: Self-pay

## 2020-12-26 ENCOUNTER — Emergency Department (HOSPITAL_COMMUNITY): Payer: Medicare HMO

## 2020-12-26 ENCOUNTER — Inpatient Hospital Stay (HOSPITAL_COMMUNITY)
Admission: EM | Admit: 2020-12-26 | Discharge: 2020-12-30 | DRG: 682 | Disposition: A | Discharge status: Home / Self Care | Payer: Medicare HMO | Attending: Internal Medicine | Admitting: Internal Medicine

## 2020-12-26 DIAGNOSIS — E872 Acidosis, unspecified: Secondary | ICD-10-CM | POA: Diagnosis present

## 2020-12-26 DIAGNOSIS — E860 Dehydration: Secondary | ICD-10-CM | POA: Diagnosis present

## 2020-12-26 DIAGNOSIS — R569 Unspecified convulsions: Secondary | ICD-10-CM | POA: Diagnosis present

## 2020-12-26 DIAGNOSIS — M21371 Foot drop, right foot: Secondary | ICD-10-CM | POA: Diagnosis present

## 2020-12-26 DIAGNOSIS — D649 Anemia, unspecified: Secondary | ICD-10-CM | POA: Diagnosis present

## 2020-12-26 DIAGNOSIS — Z6841 Body Mass Index (BMI) 40.0 and over, adult: Secondary | ICD-10-CM | POA: Diagnosis not present

## 2020-12-26 DIAGNOSIS — R739 Hyperglycemia, unspecified: Secondary | ICD-10-CM

## 2020-12-26 DIAGNOSIS — E861 Hypovolemia: Secondary | ICD-10-CM | POA: Diagnosis present

## 2020-12-26 DIAGNOSIS — E871 Hypo-osmolality and hyponatremia: Secondary | ICD-10-CM | POA: Diagnosis present

## 2020-12-26 DIAGNOSIS — Z79899 Other long term (current) drug therapy: Secondary | ICD-10-CM

## 2020-12-26 DIAGNOSIS — K219 Gastro-esophageal reflux disease without esophagitis: Secondary | ICD-10-CM | POA: Diagnosis present

## 2020-12-26 DIAGNOSIS — G894 Chronic pain syndrome: Secondary | ICD-10-CM | POA: Diagnosis present

## 2020-12-26 DIAGNOSIS — Z8249 Family history of ischemic heart disease and other diseases of the circulatory system: Secondary | ICD-10-CM | POA: Diagnosis not present

## 2020-12-26 DIAGNOSIS — E1165 Type 2 diabetes mellitus with hyperglycemia: Secondary | ICD-10-CM | POA: Diagnosis present

## 2020-12-26 DIAGNOSIS — Z79890 Hormone replacement therapy (postmenopausal): Secondary | ICD-10-CM

## 2020-12-26 DIAGNOSIS — E662 Morbid (severe) obesity with alveolar hypoventilation: Secondary | ICD-10-CM | POA: Diagnosis present

## 2020-12-26 DIAGNOSIS — Z833 Family history of diabetes mellitus: Secondary | ICD-10-CM

## 2020-12-26 DIAGNOSIS — I10 Essential (primary) hypertension: Secondary | ICD-10-CM | POA: Diagnosis present

## 2020-12-26 DIAGNOSIS — M797 Fibromyalgia: Secondary | ICD-10-CM | POA: Diagnosis present

## 2020-12-26 DIAGNOSIS — Z20822 Contact with and (suspected) exposure to covid-19: Secondary | ICD-10-CM | POA: Diagnosis present

## 2020-12-26 DIAGNOSIS — Z880 Allergy status to penicillin: Secondary | ICD-10-CM

## 2020-12-26 DIAGNOSIS — I9589 Other hypotension: Secondary | ICD-10-CM | POA: Diagnosis not present

## 2020-12-26 DIAGNOSIS — E039 Hypothyroidism, unspecified: Secondary | ICD-10-CM | POA: Diagnosis present

## 2020-12-26 DIAGNOSIS — Z882 Allergy status to sulfonamides status: Secondary | ICD-10-CM

## 2020-12-26 DIAGNOSIS — R571 Hypovolemic shock: Secondary | ICD-10-CM | POA: Diagnosis present

## 2020-12-26 DIAGNOSIS — G9341 Metabolic encephalopathy: Secondary | ICD-10-CM | POA: Diagnosis not present

## 2020-12-26 DIAGNOSIS — R579 Shock, unspecified: Secondary | ICD-10-CM | POA: Diagnosis not present

## 2020-12-26 DIAGNOSIS — Z794 Long term (current) use of insulin: Secondary | ICD-10-CM

## 2020-12-26 DIAGNOSIS — F32A Depression, unspecified: Secondary | ICD-10-CM | POA: Diagnosis present

## 2020-12-26 DIAGNOSIS — R29810 Facial weakness: Secondary | ICD-10-CM | POA: Diagnosis not present

## 2020-12-26 DIAGNOSIS — Z888 Allergy status to other drugs, medicaments and biological substances status: Secondary | ICD-10-CM

## 2020-12-26 DIAGNOSIS — Z8616 Personal history of COVID-19: Secondary | ICD-10-CM | POA: Diagnosis not present

## 2020-12-26 DIAGNOSIS — N179 Acute kidney failure, unspecified: Secondary | ICD-10-CM | POA: Diagnosis present

## 2020-12-26 LAB — CBC WITH DIFFERENTIAL/PLATELET
Abs Immature Granulocytes: 0.05 10*3/uL (ref 0.00–0.07)
Basophils Absolute: 0 10*3/uL (ref 0.0–0.1)
Basophils Relative: 0 %
Eosinophils Absolute: 0.2 10*3/uL (ref 0.0–0.5)
Eosinophils Relative: 1 %
HCT: 39.6 % (ref 36.0–46.0)
Hemoglobin: 12.5 g/dL (ref 12.0–15.0)
Immature Granulocytes: 0 %
Lymphocytes Relative: 32 %
Lymphs Abs: 3.8 10*3/uL (ref 0.7–4.0)
MCH: 30.9 pg (ref 26.0–34.0)
MCHC: 31.6 g/dL (ref 30.0–36.0)
MCV: 97.8 fL (ref 80.0–100.0)
Monocytes Absolute: 1 10*3/uL (ref 0.1–1.0)
Monocytes Relative: 9 %
Neutro Abs: 6.7 10*3/uL (ref 1.7–7.7)
Neutrophils Relative %: 58 %
Platelets: 188 10*3/uL (ref 150–400)
RBC: 4.05 MIL/uL (ref 3.87–5.11)
RDW: 13 % (ref 11.5–15.5)
WBC: 11.7 10*3/uL — ABNORMAL HIGH (ref 4.0–10.5)
nRBC: 0 % (ref 0.0–0.2)

## 2020-12-26 LAB — I-STAT ARTERIAL BLOOD GAS, ED
Acid-base deficit: 10 mmol/L — ABNORMAL HIGH (ref 0.0–2.0)
Bicarbonate: 18 mmol/L — ABNORMAL LOW (ref 20.0–28.0)
Calcium, Ion: 1.06 mmol/L — ABNORMAL LOW (ref 1.15–1.40)
HCT: 34 % — ABNORMAL LOW (ref 36.0–46.0)
Hemoglobin: 11.6 g/dL — ABNORMAL LOW (ref 12.0–15.0)
O2 Saturation: 95 %
Patient temperature: 98.9
Potassium: 4.9 mmol/L (ref 3.5–5.1)
Sodium: 134 mmol/L — ABNORMAL LOW (ref 135–145)
TCO2: 19 mmol/L — ABNORMAL LOW (ref 22–32)
pCO2 arterial: 47.3 mmHg (ref 32.0–48.0)
pH, Arterial: 7.19 — CL (ref 7.350–7.450)
pO2, Arterial: 97 mmHg (ref 83.0–108.0)

## 2020-12-26 LAB — URINALYSIS, ROUTINE W REFLEX MICROSCOPIC
Glucose, UA: 50 mg/dL — AB
Hgb urine dipstick: NEGATIVE
Ketones, ur: 5 mg/dL — AB
Leukocytes,Ua: NEGATIVE
Nitrite: NEGATIVE
Protein, ur: NEGATIVE mg/dL
Specific Gravity, Urine: 1.02 (ref 1.005–1.030)
pH: 5 (ref 5.0–8.0)

## 2020-12-26 LAB — BASIC METABOLIC PANEL
Anion gap: 12 (ref 5–15)
Anion gap: 14 (ref 5–15)
Anion gap: 11 (ref 5–15)
Anion gap: 8 (ref 5–15)
BUN: 55 mg/dL — ABNORMAL HIGH (ref 6–20)
BUN: 58 mg/dL — ABNORMAL HIGH (ref 6–20)
BUN: 58 mg/dL — ABNORMAL HIGH (ref 6–20)
BUN: 51 mg/dL — ABNORMAL HIGH (ref 6–20)
CO2: 19 mmol/L — ABNORMAL LOW (ref 22–32)
CO2: 18 mmol/L — ABNORMAL LOW (ref 22–32)
CO2: 17 mmol/L — ABNORMAL LOW (ref 22–32)
CO2: 17 mmol/L — ABNORMAL LOW (ref 22–32)
Calcium: 7.9 mg/dL — ABNORMAL LOW (ref 8.9–10.3)
Calcium: 7.9 mg/dL — ABNORMAL LOW (ref 8.9–10.3)
Calcium: 7 mg/dL — ABNORMAL LOW (ref 8.9–10.3)
Calcium: 7.2 mg/dL — ABNORMAL LOW (ref 8.9–10.3)
Chloride: 97 mmol/L — ABNORMAL LOW (ref 98–111)
Chloride: 97 mmol/L — ABNORMAL LOW (ref 98–111)
Chloride: 103 mmol/L (ref 98–111)
Chloride: 107 mmol/L (ref 98–111)
Creatinine, Ser: 5.45 mg/dL — ABNORMAL HIGH (ref 0.44–1.00)
Creatinine, Ser: 6.35 mg/dL — ABNORMAL HIGH (ref 0.44–1.00)
Creatinine, Ser: 5.74 mg/dL — ABNORMAL HIGH (ref 0.44–1.00)
Creatinine, Ser: 4.29 mg/dL — ABNORMAL HIGH (ref 0.44–1.00)
GFR, Estimated: 9 mL/min — ABNORMAL LOW (ref >60)
GFR, Estimated: 7 mL/min — ABNORMAL LOW (ref >60)
GFR, Estimated: 8 mL/min — ABNORMAL LOW (ref >60)
GFR, Estimated: 12 mL/min — ABNORMAL LOW (ref >60)
Glucose, Bld: 523 mg/dL (ref 70–99)
Glucose, Bld: 427 mg/dL — ABNORMAL HIGH (ref 70–99)
Glucose, Bld: 330 mg/dL — ABNORMAL HIGH (ref 70–99)
Glucose, Bld: 322 mg/dL — ABNORMAL HIGH (ref 70–99)
Potassium: 4.6 mmol/L (ref 3.5–5.1)
Potassium: 4.9 mmol/L (ref 3.5–5.1)
Potassium: 4.3 mmol/L (ref 3.5–5.1)
Potassium: 4.7 mmol/L (ref 3.5–5.1)
Sodium: 128 mmol/L — ABNORMAL LOW (ref 135–145)
Sodium: 129 mmol/L — ABNORMAL LOW (ref 135–145)
Sodium: 131 mmol/L — ABNORMAL LOW (ref 135–145)
Sodium: 132 mmol/L — ABNORMAL LOW (ref 135–145)

## 2020-12-26 LAB — CBG MONITORING, ED
Glucose-Capillary: 416 mg/dL — ABNORMAL HIGH (ref 70–99)
Glucose-Capillary: 419 mg/dL — ABNORMAL HIGH (ref 70–99)
Glucose-Capillary: 404 mg/dL — ABNORMAL HIGH (ref 70–99)
Glucose-Capillary: 311 mg/dL — ABNORMAL HIGH (ref 70–99)
Glucose-Capillary: 280 mg/dL — ABNORMAL HIGH (ref 70–99)
Glucose-Capillary: 268 mg/dL — ABNORMAL HIGH (ref 70–99)
Glucose-Capillary: 286 mg/dL — ABNORMAL HIGH (ref 70–99)

## 2020-12-26 LAB — CBC
HCT: 37.8 % (ref 36.0–46.0)
Hemoglobin: 12.2 g/dL (ref 12.0–15.0)
MCH: 31.1 pg (ref 26.0–34.0)
MCHC: 32.3 g/dL (ref 30.0–36.0)
MCV: 96.4 fL (ref 80.0–100.0)
Platelets: 182 10*3/uL (ref 150–400)
RBC: 3.92 MIL/uL (ref 3.87–5.11)
RDW: 13.2 % (ref 11.5–15.5)
WBC: 11.4 10*3/uL — ABNORMAL HIGH (ref 4.0–10.5)
nRBC: 0 % (ref 0.0–0.2)

## 2020-12-26 LAB — MRSA NEXT GEN BY PCR, NASAL: MRSA by PCR Next Gen: NOT DETECTED

## 2020-12-26 LAB — BETA-HYDROXYBUTYRIC ACID
Beta-Hydroxybutyric Acid: 0.17 mmol/L (ref 0.05–0.27)
Beta-Hydroxybutyric Acid: 0.47 mmol/L — ABNORMAL HIGH (ref 0.05–0.27)

## 2020-12-26 LAB — I-STAT BETA HCG BLOOD, ED (MC, WL, AP ONLY): I-stat hCG, quantitative: 9.2 m[IU]/mL — ABNORMAL HIGH (ref <5)

## 2020-12-26 LAB — OSMOLALITY: Osmolality: 304 mOsm/kg — ABNORMAL HIGH (ref 275–295)

## 2020-12-26 LAB — HEMOGLOBIN A1C
Hgb A1c MFr Bld: 10.3 % — ABNORMAL HIGH (ref 4.8–5.6)
Mean Plasma Glucose: 248.91 mg/dL

## 2020-12-26 LAB — GLUCOSE, CAPILLARY
Glucose-Capillary: 280 mg/dL — ABNORMAL HIGH (ref 70–99)
Glucose-Capillary: 292 mg/dL — ABNORMAL HIGH (ref 70–99)

## 2020-12-26 LAB — RESP PANEL BY RT-PCR (FLU A&B, COVID) ARPGX2
Influenza A by PCR: NEGATIVE
Influenza B by PCR: NEGATIVE
SARS Coronavirus 2 by RT PCR: NEGATIVE

## 2020-12-26 LAB — LACTIC ACID, PLASMA: Lactic Acid, Venous: 1 mmol/L (ref 0.5–1.9)

## 2020-12-26 IMAGING — CT CT RENAL STONE PROTOCOL
2 of 3 series · 15 of 46 positions shown, 17 images · non-contrast
Comparison: CT the abdomen and pelvis [DATE].

CLINICAL DATA: 50-year-old female with history of flank pain.
Suspected kidney stone.

EXAM:
CT ABDOMEN AND PELVIS WITHOUT CONTRAST
TECHNIQUE: Multidetector CT imaging of the abdomen and pelvis was performed
following the standard protocol without IV contrast.

[Series 3: cor · coronal · 0.95mm/px · 3 of 224 slices shown]
[im 75/224  soft-tissue]
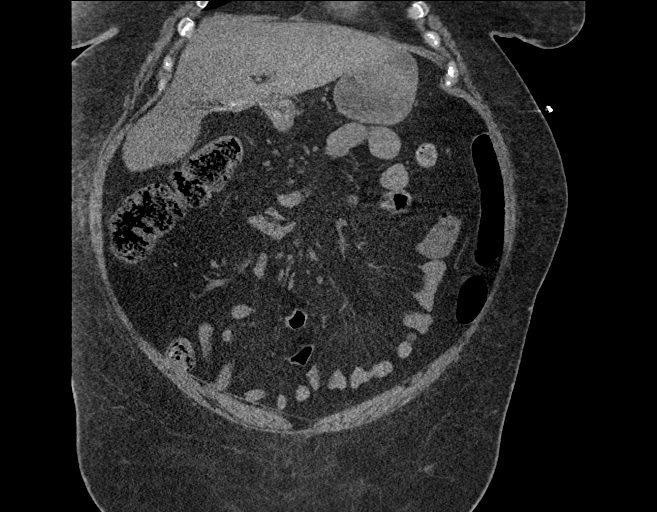
[im 100/224  soft-tissue]
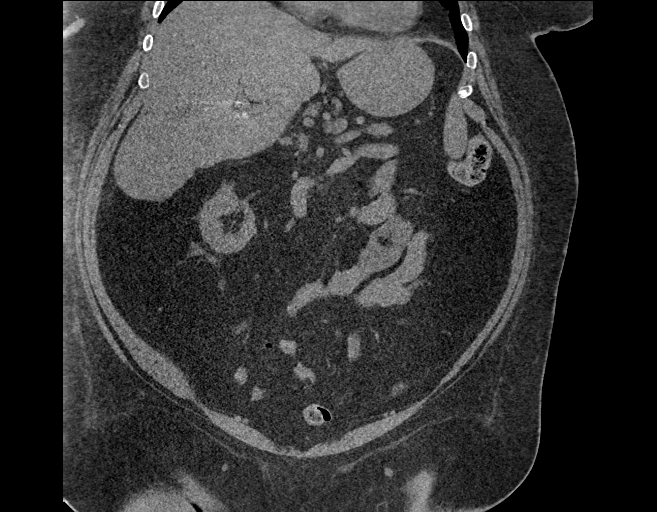
[im 124/224  soft-tissue]
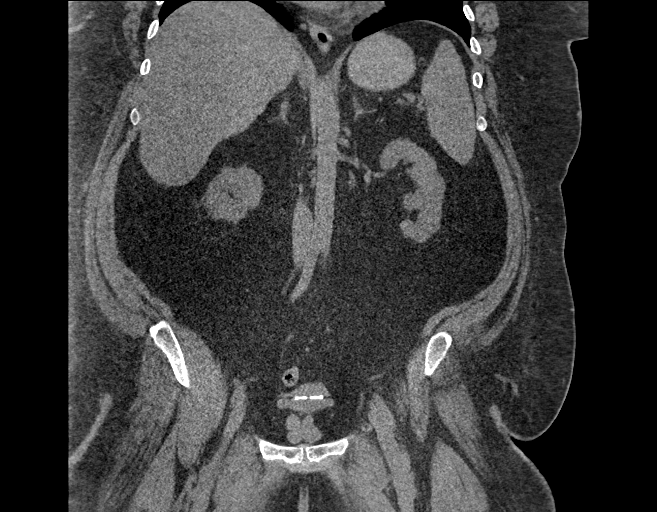

[Series 4: renal stone 5.0 · axial · 0.98mm/px · z∈[-351,+64]mm · 12 of 97 slices shown, 14 images]
[im 7/97  soft-tissue]
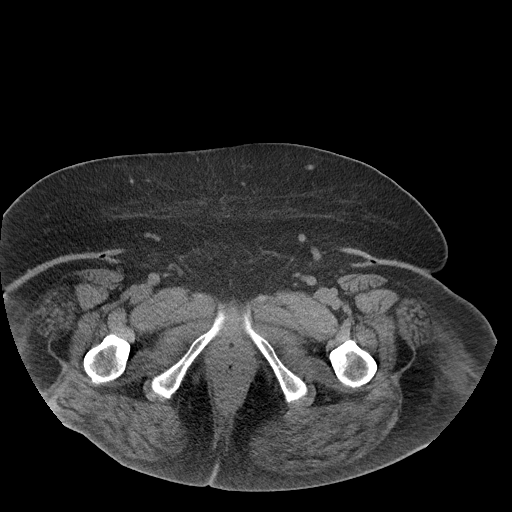
[im 7/97  bone]
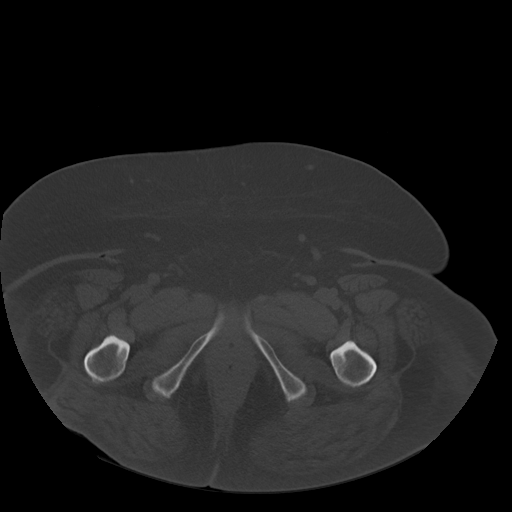
[im 13/97  soft-tissue]
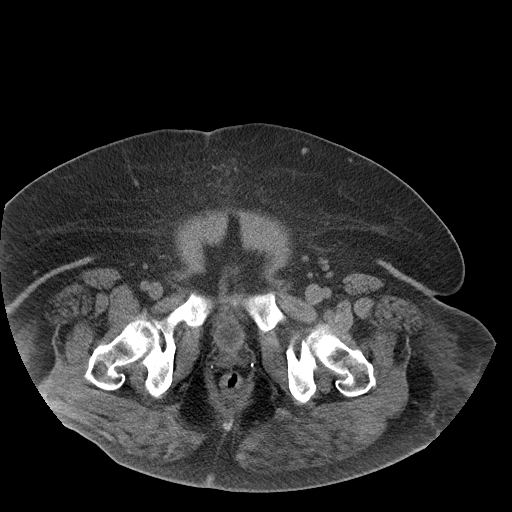
[im 22/97  soft-tissue]
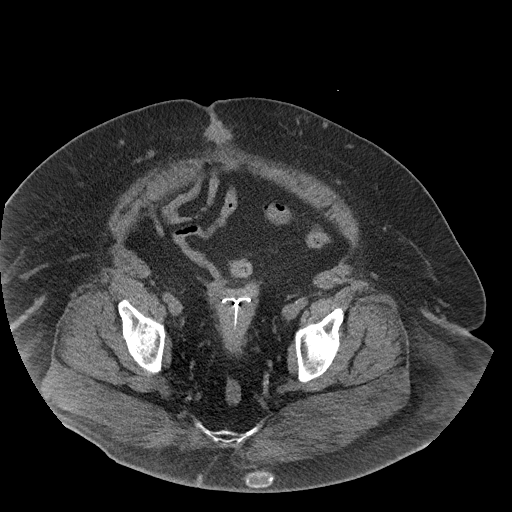
[im 28/97  soft-tissue]
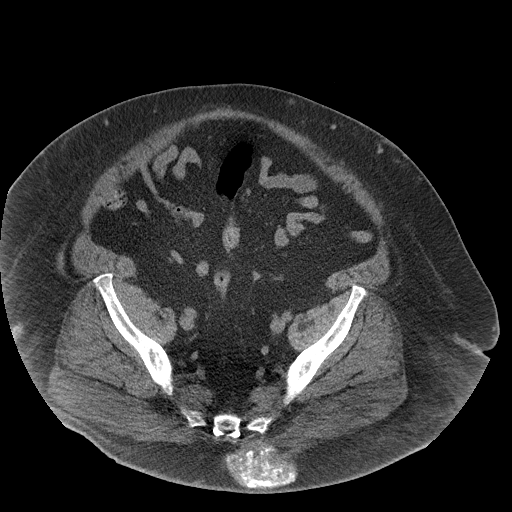
[im 38/97  soft-tissue]
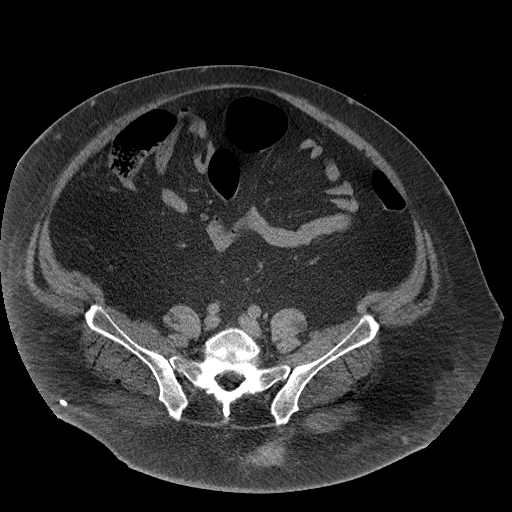
[im 44/97  soft-tissue]
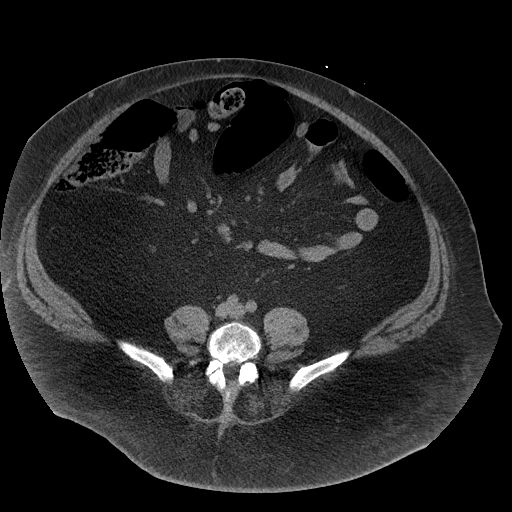
[im 53/97  soft-tissue]
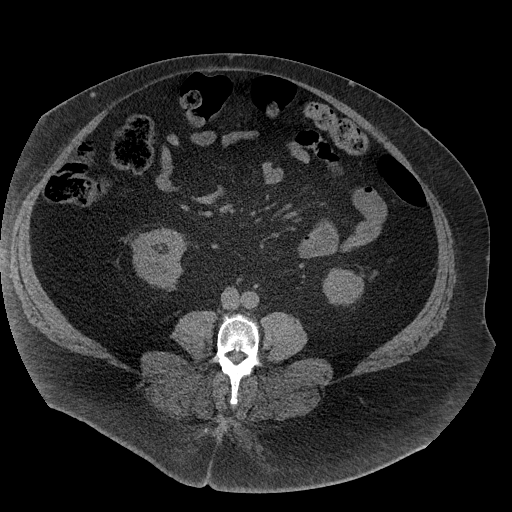
[im 59/97  soft-tissue]
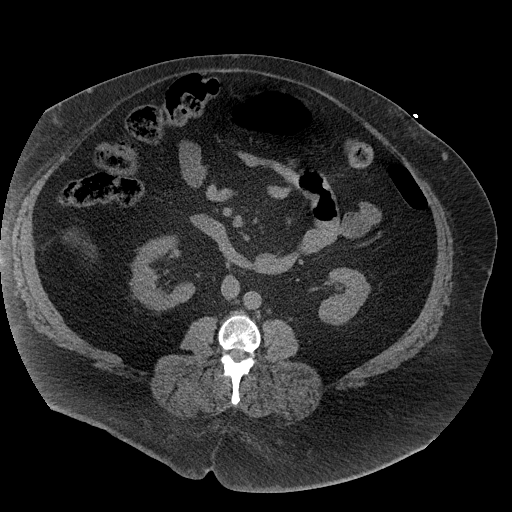
[im 69/97  soft-tissue]
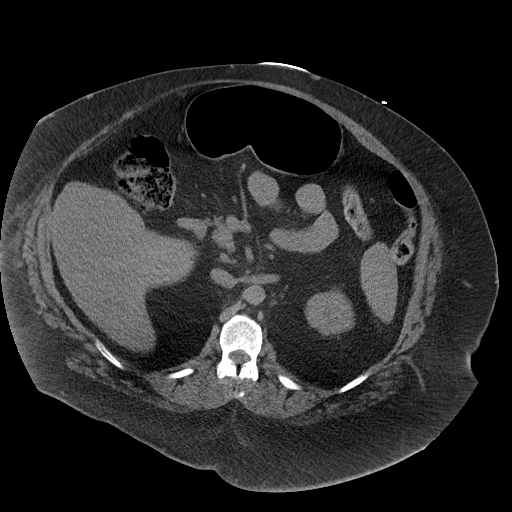
[im 69/97  bone]
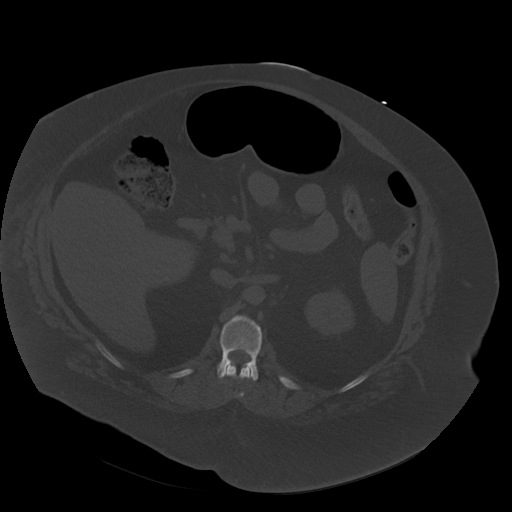
[im 75/97  soft-tissue]
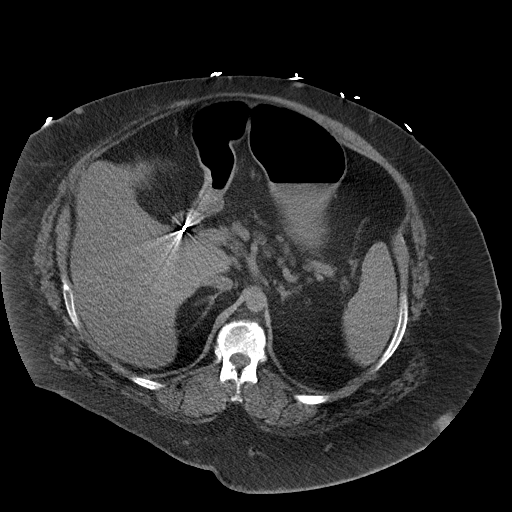
[im 84/97  soft-tissue]
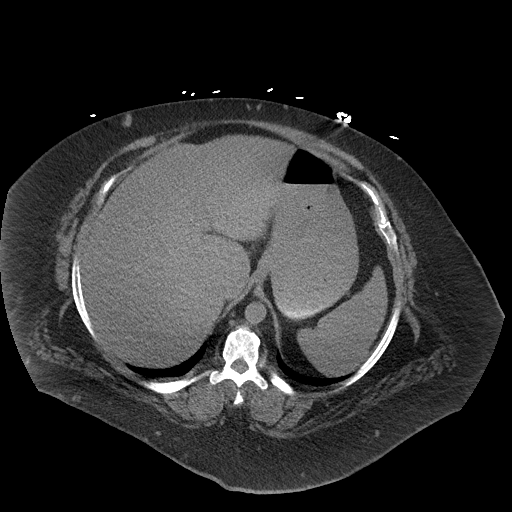
[im 90/97  soft-tissue]
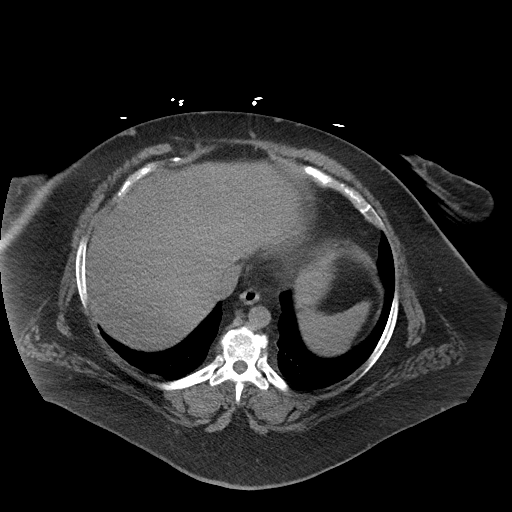

[15 of 46 positions shown; findings below may reference images not displayed]

FINDINGS: Lower chest: Areas of mild scarring are noted in the lung bases
bilaterally.

Hepatobiliary: Diffuse low attenuation throughout the hepatic
parenchyma, indicative of severe hepatic steatosis. No discrete
cystic or solid hepatic lesions are confidently identified on
today's noncontrast CT examination. Status post cholecystectomy.

Pancreas: No definite pancreatic mass or peripancreatic fluid
collections or inflammatory changes are noted on today's noncontrast
CT examination.

Spleen: Unremarkable.

Adrenals/Urinary Tract: There are no abnormal calcifications within
the collecting system of either kidney, along the course of either
ureter, or within the lumen of the urinary bladder. No
hydroureteronephrosis or perinephric stranding to suggest urinary
tract obstruction at this time. The unenhanced appearance of the
kidneys is unremarkable bilaterally. Urinary bladder is completely
decompressed around an indwelling Foley balloon catheter, but is
otherwise normal in appearance. Bilateral adrenal glands are normal
in appearance.

Stomach/Bowel: Unenhanced appearance of the stomach is normal. No
pathologic dilatation of small bowel or colon. Normal appendix.

Vascular/Lymphatic: No atherosclerotic calcifications in the
abdominal aorta or pelvic vasculature. No lymphadenopathy noted in
the abdomen or pelvis.

Reproductive: IUD present in the uterus. Uterus and ovaries are
otherwise unremarkable in appearance.

Other: No significant volume of ascites.  No pneumoperitoneum.

Musculoskeletal: Mixed attenuation partially calcified mass in the
medial aspect of the left gluteal region, similar to prior studies,
measuring up to 7.4 x 4.5 cm on today's examination, compatible with
a benign lesions such as fat necrosis or dystrophic posttraumatic
calcifications. There are no aggressive appearing lytic or blastic
lesions noted in the visualized portions of the skeleton.
IMPRESSION: 1. No acute findings are noted in the abdomen or pelvis to account
for the patient's symptoms. Specifically, no urinary tract calculus
and no findings of urinary tract obstruction.
2. Severe hepatic steatosis.
3. Additional incidental findings, as above.

## 2020-12-26 MED ORDER — POLYETHYLENE GLYCOL 3350 17 G PO PACK: 17.0000 g | PACK | Freq: Every day | ORAL | Status: DC | PRN

## 2020-12-26 MED ORDER — NALOXONE HCL 0.4 MG/ML IJ SOLN: 0.4000 mg | Freq: Once | INTRAMUSCULAR | Status: AC

## 2020-12-26 MED ORDER — HYDROMORPHONE HCL 1 MG/ML IJ SOLN: 0.5000 mg | Freq: Once | INTRAMUSCULAR | Status: AC

## 2020-12-26 MED ORDER — SODIUM CHLORIDE 0.9 % IV BOLUS
1000.0000 mL | Freq: Once | INTRAVENOUS | Status: AC
  Administered 2020-12-26: 1000 mL via INTRAVENOUS

## 2020-12-26 MED ORDER — NOREPINEPHRINE 4 MG/250ML-% IV SOLN
2.0000 ug/min | INTRAVENOUS | Status: DC
  Administered 2020-12-26: 2 ug/min via INTRAVENOUS
  Administered 2020-12-27 (×2): 5 ug/min via INTRAVENOUS
  Filled 2020-12-26 (×3): qty 250

## 2020-12-26 MED ORDER — POTASSIUM CHLORIDE 10 MEQ/100ML IV SOLN
10.0000 meq | INTRAVENOUS | Status: AC
  Administered 2020-12-26 (×2): 10 meq via INTRAVENOUS
  Filled 2020-12-26 (×2): qty 100

## 2020-12-26 MED ORDER — SODIUM CHLORIDE 0.9 % IV SOLN
250.0000 mL | INTRAVENOUS | Status: DC
  Administered 2020-12-27 (×2): 250 mL via INTRAVENOUS

## 2020-12-26 MED ORDER — DEXTROSE 50 % IV SOLN: 0.0000 mL | INTRAVENOUS | Status: DC | PRN

## 2020-12-26 MED ORDER — HYDROMORPHONE HCL 1 MG/ML IJ SOLN
INTRAMUSCULAR | Status: AC
  Administered 2020-12-26: 0.1 mg via INTRAVENOUS
  Filled 2020-12-26: qty 1

## 2020-12-26 MED ORDER — HEPARIN SODIUM (PORCINE) 5000 UNIT/ML IJ SOLN
5000.0000 [IU] | Freq: Three times a day (TID) | INTRAMUSCULAR | Status: DC
  Administered 2020-12-26 – 2020-12-30 (×12): 5000 [IU] via SUBCUTANEOUS
  Filled 2020-12-26 (×12): qty 1

## 2020-12-26 MED ORDER — SODIUM CHLORIDE 0.9 % IV SOLN: INTRAVENOUS | Status: DC

## 2020-12-26 MED ORDER — SODIUM CHLORIDE 0.9 % IV BOLUS
20.0000 mL/kg | Freq: Once | INTRAVENOUS | Status: AC
  Administered 2020-12-26: 3102 mL via INTRAVENOUS

## 2020-12-26 MED ORDER — DULOXETINE HCL 60 MG PO CPEP
60.0000 mg | ORAL_CAPSULE | Freq: Two times a day (BID) | ORAL | Status: DC
  Administered 2020-12-27 – 2020-12-30 (×7): 60 mg via ORAL
  Filled 2020-12-26 (×7): qty 1

## 2020-12-26 MED ORDER — INSULIN ASPART 100 UNIT/ML IJ SOLN
0.0000 [IU] | INTRAMUSCULAR | Status: DC
  Administered 2020-12-26: 11 [IU] via SUBCUTANEOUS
  Administered 2020-12-27: 15 [IU] via SUBCUTANEOUS
  Administered 2020-12-27: 7 [IU] via SUBCUTANEOUS
  Administered 2020-12-27: 11 [IU] via SUBCUTANEOUS
  Administered 2020-12-27: 7 [IU] via SUBCUTANEOUS
  Administered 2020-12-27: 11 [IU] via SUBCUTANEOUS
  Administered 2020-12-27: 7 [IU] via SUBCUTANEOUS
  Administered 2020-12-27: 15 [IU] via SUBCUTANEOUS
  Administered 2020-12-28 (×2): 11 [IU] via SUBCUTANEOUS

## 2020-12-26 MED ORDER — ACETAMINOPHEN 325 MG PO TABS
650.0000 mg | ORAL_TABLET | Freq: Four times a day (QID) | ORAL | Status: DC | PRN
  Administered 2020-12-27 – 2020-12-29 (×2): 650 mg via ORAL
  Filled 2020-12-26 (×2): qty 2

## 2020-12-26 MED ORDER — INSULIN REGULAR(HUMAN) IN NACL 100-0.9 UT/100ML-% IV SOLN
INTRAVENOUS | Status: DC
  Administered 2020-12-26: 10 [IU]/h via INTRAVENOUS
  Filled 2020-12-26: qty 100

## 2020-12-26 MED ORDER — SENNOSIDES-DOCUSATE SODIUM 8.6-50 MG PO TABS
1.0000 | ORAL_TABLET | Freq: Every evening | ORAL | Status: DC | PRN
  Filled 2020-12-26: qty 1

## 2020-12-26 MED ORDER — DOCUSATE SODIUM 100 MG PO CAPS: 100.0000 mg | ORAL_CAPSULE | Freq: Two times a day (BID) | ORAL | Status: DC | PRN

## 2020-12-26 MED ORDER — LIDOCAINE 5 % EX PTCH
1.0000 | MEDICATED_PATCH | CUTANEOUS | Status: DC
  Administered 2020-12-27 – 2020-12-29 (×3): 1 via TRANSDERMAL
  Filled 2020-12-26 (×3): qty 1

## 2020-12-26 MED ORDER — NALOXONE HCL 0.4 MG/ML IJ SOLN
INTRAMUSCULAR | Status: AC
  Administered 2020-12-26: 0.4 mg via INTRAVENOUS
  Filled 2020-12-26: qty 1

## 2020-12-26 MED ORDER — LEVOTHYROXINE SODIUM 75 MCG PO TABS
150.0000 ug | ORAL_TABLET | Freq: Every day | ORAL | Status: DC
  Administered 2020-12-27 – 2020-12-30 (×4): 150 ug via ORAL
  Filled 2020-12-26 (×4): qty 2

## 2020-12-26 MED ORDER — DEXTROSE IN LACTATED RINGERS 5 % IV SOLN: INTRAVENOUS | Status: DC

## 2020-12-26 MED ORDER — FENTANYL CITRATE PF 50 MCG/ML IJ SOSY
50.0000 ug | PREFILLED_SYRINGE | Freq: Once | INTRAMUSCULAR | Status: AC
  Administered 2020-12-26: 50 ug via INTRAVENOUS
  Filled 2020-12-26: qty 1

## 2020-12-26 MED ORDER — LACTATED RINGERS IV BOLUS: 20.0000 mL/kg | Freq: Once | INTRAVENOUS | Status: DC

## 2020-12-26 MED ORDER — INSULIN ASPART 100 UNIT/ML IJ SOLN
6.0000 [IU] | Freq: Once | INTRAMUSCULAR | Status: AC
  Administered 2020-12-26: 6 [IU] via INTRAVENOUS

## 2020-12-26 MED ORDER — ACETAMINOPHEN 650 MG RE SUPP: 650.0000 mg | Freq: Four times a day (QID) | RECTAL | Status: DC | PRN

## 2020-12-26 MED ORDER — SODIUM CHLORIDE 0.9 % IV SOLN: INTRAVENOUS | Status: DC | PRN

## 2020-12-26 MED ORDER — LACTATED RINGERS IV SOLN: INTRAVENOUS | Status: AC

## 2020-12-26 MED ORDER — ACETAMINOPHEN 325 MG PO TABS: 650.0000 mg | ORAL_TABLET | Freq: Four times a day (QID) | ORAL | Status: DC | PRN

## 2020-12-26 MED ORDER — CHLORHEXIDINE GLUCONATE CLOTH 2 % EX PADS
6.0000 | MEDICATED_PAD | Freq: Every day | CUTANEOUS | Status: DC
  Administered 2020-12-26 – 2020-12-30 (×5): 6 via TOPICAL

## 2020-12-26 NOTE — ED Notes (Signed)
Hospitalist at bedside 

## 2020-12-26 NOTE — Consult Note (Signed)
[facility]  INPATIENT CONSULTATION  Reason for Consultation: AKI Requesting Provider: Dr. Francia Greaves  HPI: [name] is an 50 y.o. female with morbid obesity, chronic pain, hypothyroidism, OHS, HTN, type 2 DM on insulin, anemia, h/o AKI who is seen for evaluation and management of AKI.   She presented to the ED today with a 5 day history of decreased UOP despite increasing oral intake.  Also having myoclonic jerking which she's had with prior AKI episodes.  Labs here showing BUN 58, Cr 6.35, K 4.9, Sodium 129, bicarb 18, WBC 11.7, Hb 12.5,  Plt 188; UA no infection, CT renal stone protocol - no stones or acute issues.  Afebrile, BPs 80/50s - 120s, HR 100s. Foley was placed and drained only ~ 170mL. Given 2L NS so far.   She denies fevers, chills, emesis, abd issues other than GERD x 3 days.  No NSAID use.  No new meds.  No rashes.  +mouth dry (more than usual), +DOE since COVID (2nd epidosde) in 08/2020.   Brother is with her in the ED; lives with boyfriend  PMH: Past Medical History:  Diagnosis Date   Acute renal failure (ARF) ([place]) 09/02/2012   Anemia    Anxiety    panic attacks   Back pain    Blood transfusion    Chronic heel ulcer ([place])    Chronic kidney disease    Chronic pain syndrome    Depression    Diabetes mellitus    type II    DJD (degenerative joint disease)    Dysrhythmia    pt unsure what this was   Fibromyalgia    GERD (gastroesophageal reflux disease)    H/O: Bell's palsy 2011   Heart murmur    "slight one"   History of kidney stones    History of MRSA infection OF ULCER   Hypertension    no longer taken since 10-11 months per patient at preop phone call of 10/13/2017    Hypothyroidism    Hypoventilation associated with obesity syndrome (HCC)    Migraine    Non-healing non-surgical wound    Right hip, has Wound vac to hip.  Started as a skin tear.   Peripheral vascular disease (HCC)    PONV (postoperative nausea and vomiting)    can  be slow to wake up after surgery   Right foot drop    Shortness of breath    with Activity   Sleep apnea    "study shows not bad enough for CPAP.", pt denies   Umbilical hernia    PSH: Past Surgical History:  Procedure Laterality Date   BACK SURGERY     for lumbar disc disease X2   BRAIN SURGERY  1970   Tumor removed- has steel plate   BRAIN SURGERY      Plating due to soft spot closing too early- age 10   BREAST LUMPECTOMY WITH NEEDLE LOCALIZATION Right 08/23/2013   Procedure: RIGHT BREAST LUMPECTOMY WITH NEEDLE LOCALIZATION;  Surgeon: Odis Hollingshead, MD;  Location: [place] [facility];  Service: General;  Laterality: Right;   CHOLECYSTECTOMY  9937   EXCISION UMBILICAL NODULE N/A 1/69/6789   Procedure: EXCISION OF UMBILICUS;  Surgeon: Coralie Keens, MD;  Location: [facility];  Service: General;  Laterality: N/A;   EYE SURGERY     eye lift   I & D EXTREMITY  04/02/2011   Procedure: IRRIGATION AND DEBRIDEMENT EXTREMITY;  Surgeon: Mcarthur Rossetti;  Location: [place];  Service: Orthopedics;  Laterality: Right;  I&D right heel ulcer, placement of A-cell graft   INCISION AND DRAINAGE ABSCESS N/A 10/28/2017   Procedure: INCISION AND DRAINAGE UMBILICAL HERNIA ABSCESS;  Surgeon: Ralene Ok, MD;  Location: [facility];  Service: General;  Laterality: N/A;   INCISION AND DRAINAGE OF WOUND  08/29/2011   Procedure: IRRIGATION AND DEBRIDEMENT WOUND;  Surgeon: Theodoro Kos, DO;  Location: [place];  Service: Plastics;  Laterality: Right;   INSERTION OF MESH N/A 10/16/2017   Procedure: INSERTION OF MESH;  Surgeon: Coralie Keens, MD;  Location: [facility];  Service: General;  Laterality: N/A;   LITHOTRIPSY     2007ish   right elbow     UMBILICAL HERNIA REPAIR N/A 10/16/2017   Procedure: UMBILICAL HERNIA REPAIR WITH MESH;  Surgeon: Coralie Keens, MD;  Location: [facility];  Service: General;  Laterality: N/A;    Past Medical History:  Diagnosis Date   Acute renal failure (ARF) ([place]) 09/02/2012   Anemia     Anxiety    panic attacks   Back pain    Blood transfusion    Chronic heel ulcer ([place])    Chronic kidney disease    Chronic pain syndrome    Depression    Diabetes mellitus    type II    DJD (degenerative joint disease)    Dysrhythmia    pt unsure what this was   Fibromyalgia    GERD (gastroesophageal reflux disease)    H/O: Bell's palsy 2011   Heart murmur    "slight one"   History of kidney stones    History of MRSA infection OF ULCER   Hypertension    no longer taken since 10-11 months per patient at preop phone call of 10/13/2017    Hypothyroidism    Hypoventilation associated with obesity syndrome ([place])    Migraine    Non-healing non-surgical wound    Right hip, has Wound vac to hip.  Started as a skin tear.   Peripheral vascular disease (HCC)    PONV (postoperative nausea and vomiting)    can be slow to wake up after surgery   Right foot drop    Shortness of breath    with Activity   Sleep apnea    "study shows not bad enough for CPAP.", pt denies   Umbilical hernia     Medications:  I have reviewed the patient's current medications.   (Not in a hospital admission)   ALLERGIES:   Allergies  Allergen Reactions   Penicillins Hives and Other (See Comments)    HAS PT DEVELOPED SEVERE RASH INVOLVING MUCUS MEMBRANES or SKIN NECROSIS: #  #  YES  #  # PATIENT HAS HAD A PCN REACTION THAT REQUIRED HOSPITALIZATION:  #  #  YES  #  #   Tolerates amoxicillin on multiple occasions per Dr. Darrel Hoover note 11/01/17.  TDD.   Clindamycin/Lincomycin Hives, Dermatitis and Rash   Nsaids Other (See Comments)    Avoid due to kidney failure caused by celebrex    Sulfa Antibiotics Hives   Versed [Midazolam] Nausea And Vomiting    Pt had medication on October 16 2017 with no issues    FAM HX: Family History  Problem Relation Age of Onset   Diabetes type II Father    Heart attack Father    Peripheral vascular disease Father    Diabetes type II Mother    Anesthesia problems  Mother    Heart attack Mother    Peripheral vascular disease Mother  Hypertension Mother     Social History:   reports that she has never smoked. She has never used smokeless tobacco. She reports that she does not drink alcohol and does not use drugs.  ROS: 12 system ROS neg excpet per HPI  Blood pressure (!) 106/93, pulse (!) 104, temperature 99 F (37.2 C), temperature source Oral, resp. rate 20, SpO2 93 %. PHYSICAL EXAM: Gen: obese woman lying flat on stretcher in no distress Eyes: anicteric ENT: MM dry Neck: supple, thick CV:  tachy, regular, 2+ radial pulses Abd:  soft, obese, nontender Lungs: clear ant, normal WOB lying GU: foley with 142mL amber urine Extr: no edema Neuro: intermittent myoclonic jerking  of UE and head; conversant but has a hard time collecting thoughts Skin: no rashes noted   Results for orders placed or performed during the hospital encounter of 12/26/20 (from the past 48 hour(s))  Resp Panel by RT-PCR (Flu A&B, Covid) Nasopharyngeal Swab     Status: None   Collection Time: 12/26/20 12:55 AM   Specimen: Nasopharyngeal Swab; Nasopharyngeal(NP) swabs in vial transport medium  Result Value Ref Range   SARS Coronavirus 2 by RT PCR NEGATIVE NEGATIVE    Comment: (NOTE) SARS-CoV-2 target nucleic acids are NOT DETECTED.  The SARS-CoV-2 RNA is generally detectable in upper respiratory specimens during the acute phase of infection. The lowest concentration of SARS-CoV-2 viral copies this assay can detect is 138 copies/mL. A negative result does not preclude SARS-Cov-2 infection and should not be used as the sole basis for treatment or other patient management decisions. A negative result may occur with  improper specimen collection/handling, submission of specimen other than nasopharyngeal swab, presence of viral mutation(s) within the areas targeted by this assay, and inadequate number of viral copies(<138 copies/mL). A negative result must be combined  with clinical observations, patient history, and epidemiological information. The expected result is Negative.  Fact Sheet for Patients:  EntrepreneurPulse.com.au  Fact Sheet for Healthcare Providers:  IncredibleEmployment.be  This test is no t yet approved or cleared by the [place] FDA and  has been authorized for detection and/or diagnosis of SARS-CoV-2 by FDA under an Emergency Use Authorization (EUA). This EUA will remain  in effect (meaning this test can be used) for the duration of the COVID-19 declaration under Section 564(b)(1) of the Act, 21 U.S.C.section 360bbb-3(b)(1), unless the authorization is terminated  or revoked sooner.       Influenza A by PCR NEGATIVE NEGATIVE   Influenza B by PCR NEGATIVE NEGATIVE    Comment: (NOTE) The Xpert Xpress SARS-CoV-2/FLU/RSV plus assay is intended as an aid in the diagnosis of influenza from Nasopharyngeal swab specimens and should not be used as a sole basis for treatment. Nasal washings and aspirates are unacceptable for Xpert Xpress SARS-CoV-2/FLU/RSV testing.  Fact Sheet for Patients: EntrepreneurPulse.com.au  Fact Sheet for Healthcare Providers: IncredibleEmployment.be  This test is not yet approved or cleared by the [place] FDA and has been authorized for detection and/or diagnosis of SARS-CoV-2 by FDA under an Emergency Use Authorization (EUA). This EUA will remain in effect (meaning this test can be used) for the duration of the COVID-19 declaration under Section 564(b)(1) of the Act, 21 U.S.C. section 360bbb-3(b)(1), unless the authorization is terminated or revoked.  Performed at [facility], [place] [address], [place], [place] [address]   CBC with Differential/Platelet     Status: Abnormal   Collection Time: 12/26/20  1:02 AM  Result Value Ref Range   WBC  11.7 (H) 4.0 - 10.5 K/uL   RBC 4.05 3.87 - 5.11 MIL/uL   Hemoglobin  12.5 12.0 - 15.0 g/dL   HCT 39.6 36.0 - 46.0 %   MCV 97.8 80.0 - 100.0 fL   MCH 30.9 26.0 - 34.0 pg   MCHC 31.6 30.0 - 36.0 g/dL   RDW 13.0 11.5 - 15.5 %   Platelets 188 150 - 400 K/uL   nRBC 0.0 0.0 - 0.2 %   Neutrophils Relative % 58 %   Neutro Abs 6.7 1.7 - 7.7 K/uL   Lymphocytes Relative 32 %   Lymphs Abs 3.8 0.7 - 4.0 K/uL   Monocytes Relative 9 %   Monocytes Absolute 1.0 0.1 - 1.0 K/uL   Eosinophils Relative 1 %   Eosinophils Absolute 0.2 0.0 - 0.5 K/uL   Basophils Relative 0 %   Basophils Absolute 0.0 0.0 - 0.1 K/uL   Immature Granulocytes 0 %   Abs Immature Granulocytes 0.05 0.00 - 0.07 K/uL    Comment: Performed at [facility] [address] [place], [place] [address]  Basic metabolic panel     Status: Abnormal   Collection Time: 12/26/20  1:02 AM  Result Value Ref Range   Sodium 128 (L) 135 - 145 mmol/L   Potassium 4.6 3.5 - 5.1 mmol/L   Chloride 97 (L) 98 - 111 mmol/L   CO2 19 (L) 22 - 32 mmol/L   Glucose, Bld 523 (HH) 70 - 99 mg/dL    Comment: Glucose reference range applies only to samples taken after fasting for at least 8 hours. CRITICAL RESULT CALLED TO, READ BACK BY AND VERIFIED WITH: BRITTANY [facility] RN 12/26/20 0144    BUN 55 (H) 6 - 20 mg/dL   Creatinine, Ser 5.45 (H) 0.44 - 1.00 mg/dL   Calcium 7.9 (L) 8.9 - 10.3 mg/dL   GFR, Estimated 9 (L) >60 mL/min    Comment: (NOTE) Calculated using the CKD-EPI Creatinine Equation (2021)    Anion gap 12 5 - 15    Comment: Performed at [facility] [address], [place], [place] [address]  CBG monitoring, ED     Status: Abnormal   Collection Time: 12/26/20  8:41 AM  Result Value Ref Range   Glucose-Capillary 419 (H) 70 - 99 mg/dL    Comment: Glucose reference range applies only to samples taken after fasting for at least 8 hours.  CBG monitoring, ED     Status: Abnormal   Collection Time: 12/26/20  9:02 AM  Result Value Ref Range   Glucose-Capillary 416 (H) 70 - 99 mg/dL    Comment:  Glucose reference range applies only to samples taken after fasting for at least 8 hours.  Basic metabolic panel     Status: Abnormal   Collection Time: 12/26/20 10:00 AM  Result Value Ref Range   Sodium 129 (L) 135 - 145 mmol/L   Potassium 4.9 3.5 - 5.1 mmol/L   Chloride 97 (L) 98 - 111 mmol/L   CO2 18 (L) 22 - 32 mmol/L   Glucose, Bld 427 (H) 70 - 99 mg/dL    Comment: Glucose reference range applies only to samples taken after fasting for at least 8 hours.   BUN 58 (H) 6 - 20 mg/dL   Creatinine, Ser 6.35 (H) 0.44 - 1.00 mg/dL   Calcium 7.9 (L) 8.9 - 10.3 mg/dL   GFR, Estimated 7 (L) >60 mL/min    Comment: (NOTE) Calculated using the CKD-EPI Creatinine  Equation (2021)    Anion gap 14 5 - 15    Comment: Performed at [facility], [place] [address] [place], [place] [address]  CBG monitoring, ED     Status: Abnormal   Collection Time: 12/26/20 10:22 AM  Result Value Ref Range   Glucose-Capillary 404 (H) 70 - 99 mg/dL    Comment: Glucose reference range applies only to samples taken after fasting for at least 8 hours.  Urinalysis, Routine w reflex microscopic     Status: Abnormal   Collection Time: 12/26/20 10:34 AM  Result Value Ref Range   Color, Urine AMBER (A) YELLOW    Comment: BIOCHEMICALS MAY BE AFFECTED BY COLOR   APPearance CLOUDY (A) CLEAR   Specific Gravity, Urine 1.020 1.005 - 1.030   pH 5.0 5.0 - 8.0   Glucose, UA 50 (A) NEGATIVE mg/dL   Hgb urine dipstick NEGATIVE NEGATIVE   Bilirubin Urine SMALL (A) NEGATIVE   Ketones, ur 5 (A) NEGATIVE mg/dL   Protein, ur NEGATIVE NEGATIVE mg/dL   Nitrite NEGATIVE NEGATIVE   Leukocytes,Ua NEGATIVE NEGATIVE    Comment: Performed at [facility] [address] [place], [place] [address]    DG Chest 2 View  Result Date: 12/26/2020 CLINICAL DATA:  Hypoxia and urinary retention EXAM: CHEST - 2 VIEW COMPARISON:  03/27/2020 FINDINGS: Cardiac shadow is mildly prominent but stable. Lungs are well aerated bilaterally. No  focal infiltrate or effusion is seen. Mild degenerative changes of the thoracic spine are noted. IMPRESSION: No acute abnormality noted. Electronically Signed   By: Inez Catalina M.D.   On: 12/26/2020 01:52   CT Renal Stone Study  Result Date: 12/26/2020 CLINICAL DATA:  50 year old female with history of flank pain. Suspected kidney stone. EXAM: CT ABDOMEN AND PELVIS WITHOUT CONTRAST TECHNIQUE: Multidetector CT imaging of the abdomen and pelvis was performed following the standard protocol without IV contrast. COMPARISON:  CT the abdomen and pelvis 03/26/2020. FINDINGS: Lower chest: Areas of mild scarring are noted in the lung bases bilaterally. Hepatobiliary: Diffuse low attenuation throughout the hepatic parenchyma, indicative of severe hepatic steatosis. No discrete cystic or solid hepatic lesions are confidently identified on today's noncontrast CT examination. Status post cholecystectomy. Pancreas: No definite pancreatic mass or peripancreatic fluid collections or inflammatory changes are noted on today's noncontrast CT examination. Spleen: Unremarkable. Adrenals/Urinary Tract: There are no abnormal calcifications within the collecting system of either kidney, along the course of either ureter, or within the lumen of the urinary bladder. No hydroureteronephrosis or perinephric stranding to suggest urinary tract obstruction at this time. The unenhanced appearance of the kidneys is unremarkable bilaterally. Urinary bladder is completely decompressed around an indwelling Foley balloon catheter, but is otherwise normal in appearance. Bilateral adrenal glands are normal in appearance. Stomach/Bowel: Unenhanced appearance of the stomach is normal. No pathologic dilatation of small bowel or colon. Normal appendix. Vascular/Lymphatic: No atherosclerotic calcifications in the abdominal aorta or pelvic vasculature. No lymphadenopathy noted in the abdomen or pelvis. Reproductive: IUD present in the uterus. Uterus and  ovaries are otherwise unremarkable in appearance. Other: No significant volume of ascites.  No pneumoperitoneum. Musculoskeletal: Mixed attenuation partially calcified mass in the medial aspect of the left gluteal region, similar to prior studies, measuring up to 7.4 x 4.5 cm on today's examination, compatible with a benign lesions such as fat necrosis or dystrophic posttraumatic calcifications. There are no aggressive appearing lytic or blastic lesions noted in the visualized portions of the skeleton. IMPRESSION: 1. No acute  findings are noted in the abdomen or pelvis to account for the patient's symptoms. Specifically, no urinary tract calculus and no findings of urinary tract obstruction. 2. Severe hepatic steatosis. 3. Additional incidental findings, as above. Electronically Signed   By: Vinnie Langton M.D.   On: 12/26/2020 12:03    Assessment/Plan **AKI, severe:  Has normal baseline renal function but 2 prior episodes of AKI, most recently 03/2020 which presented similarly and attributed to volume depletion.  Her imaging and UA are reassuring today. Clinically she looks dry and BPs are low - would continue with volume expansion though she may have progressed to ATN -- LR 100/hr ordered.  No current indications for RRT and hopefully won't develop - discussed.  Avoid nephrotoxins. Dose meds for renal function.   **Metabolic acidosis: secondary to AKI: mild, follow for now.    **Hyponatremia, hypovolemic:  corrects to nearly normal for glucose but follow with volume expansion.   **DM:  BG >400 and ketones in urine - per primary.   **Chronic pain:  per primary.  Pervasive issue for her.    **h/o seizures  Will follow, page with issues.   Justin Mend 12/26/2020, 12:57 PM

## 2020-12-26 NOTE — ED Notes (Signed)
Pts oxygen saturation on room air 90%, pt denies any SOB. Pt placed on 2L [place], oxygen saturation now at 94%

## 2020-12-26 NOTE — ED Notes (Signed)
Patient transported to CT 

## 2020-12-26 NOTE — Code Documentation (Signed)
Code stroke called for Left sided facial droop. Pt is alert, oriented x4 and states she has Bell's palsy since 1998. NIHSS performed and one point given for left facial droop. Her left eyelid droops as well however she can raise and lower her left eyebrow.  NIHSS 1 Discussed with Dr. Cheral Marker over the telephone. Due to chronic nature of her Bell's palsy and absence of any other focal deficit, code stroke cancelled.

## 2020-12-26 NOTE — Progress Notes (Signed)
Upon initial assessment of patient to ICU, patient has a left-sided facial droop and left eye droop. Patient A&Ox4, able to move all four extremities equally on both sides, clear speech, and no vision loss.  Patient stated she has a history of Bell's Palsy. When asked which side, the patient pointed to the right-side of her face, had some confusion / could not confidently remember or state which side.   Notified E-Link MD and Code Stroke called. Will continue to monitor patient closely.

## 2020-12-26 NOTE — Progress Notes (Addendum)
eLink Physician-Brief Progress Note Patient Name: [name] DOB: 1970/08/28 MRN: [id number]   Date of Service  12/26/2020  HPI/Events of Note  Patient with left sided facial droop. She was originally admitted for acute renal failure, Hypovolemic shock, DKA with severe volume depletion, and hypothyroidism.  eICU Interventions  Bedside RN asked to call a code stroke. New Patient Evaluation.        [name] [name] 12/26/2020, 9:41 PM
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
## 2020-12-26 NOTE — ED Notes (Signed)
2 fentanyl patches found on L buttocks dated 9/30. Removed both patches. MD aware

## 2020-12-26 NOTE — ED Notes (Signed)
RN Aware of pts low BP

## 2020-12-26 NOTE — ED Notes (Signed)
Notified [place] MD about pt BP 94/43.

## 2020-12-26 NOTE — ED Notes (Signed)
Paged admitting regarding pt's BP of 74/46. Awaiting reply

## 2020-12-26 NOTE — ED Notes (Signed)
Attempted to bladder scan pt with assistance by Dr Francia Greaves, unable to get a reading due to body habitus. Pt reports severe urinary retention. Order for foley in place.

## 2020-12-26 NOTE — H&P (Addendum)
NAME:  [name], MRN:  [id number], DOB:  [birth year], LOS: 0 ADMISSION DATE:  12/26/2020, CONSULTATION DATE:  12/26/20 REFERRING MD: Maudie Mercury, MD  CHIEF COMPLAINT:  hypotension  History of Present Illness:  [name] is a 50 year old woman with CKD, fibromyalgia, GERD, hypertension and depression who presented to the hospital with decreased urine output over the past 2-3 days and noticed myoclonic jerks which she has had with prior kidney injury's. Her Cr is 6.35 today with a baseline around 0.8. CT Renal stone protocol is negative for hydronephrosis. Patient received fentanyl for pain and developed altered mentation with hypotension. Her mentation improved with narcan. PCCM has been consulted for the hypotension.  Patient's brother is at the bedside. Patient is somnolent but arousable to verbal stimuli. She denied being in any pain or having nausea, vomiting, dizzniness of feeling light headed.   She has received 5L of IV fluids in the ER. Nephrology has seen the patient and started her on LR at 177m/hr. She has made around 3062mof urine since foley  placement.   Pertinent  Medical History  Chronic Kidney Disease Diabetes mellitus Depression Chronic Pain Syndrome GERD Anemia Hypertension Hypothyroidism OSA/OHS  Significant Hospital Events: Including procedures, antibiotic start and stop dates in addition to other pertinent events   10/4 admitted for Acute renal failure and hypotension  Interim History / Subjective:    Objective   Blood pressure (!) 94/43, pulse (!) 105, temperature 98.8 F (37.1 C), temperature source Oral, resp. rate 17, weight (!) 155.1 kg, SpO2 93 %.        Intake/Output Summary (Last 24 hours) at 12/26/2020 1728 Last data filed at 12/26/2020 1441 Gross per 24 hour  Intake 2000 ml  Output --  Net 2000 ml   Filed Weights   12/26/20 1519  Weight: (!) 155.1 kg    Examination: General: obese woman, somnolent, no acute  distress HENT: [place]/AT, sclera anicteric, dry mucous membranes Lungs: Clear to auscultation bilaterally, no wheezing. Cardiovascular: Tachycardic, S1-S2, no murmurs Abdomen: Soft, nontender, nondistended, bowel sounds present Extremities: No edema, warm Neuro: Somnolent, arousable to verbal stimuli, follows commands GU: Foley catheter in place  Resolved Hospital Problem list     Assessment & Plan:  Acute renal failure Hyponatremia Non-anion gap Metabolic acidosis Likely prerenal etiology due to hypovolemia -Nephrology has been consulted and is following -Continue LR at 100 mL/h, further boluses as needed -No acute indication for renal replacement therapy at this time - Renally dosed medications, avoid nephrotoxic agents - CT renal protocol negative for obstruction  Acute encephalopathy In setting of shock, acute renal failure and metabolic abnormalities -Avoid sedating agents  Shock In setting of hypovolemia and vasaplegia secondary to narcotic medication -She has received 5 L of crystalloid fluid resuscitation -Continue continuous IV fluids at 100 mL/h -PRN fluid boluses -Peripheral Levophed -Place arterial line for more accurate blood pressure readings due to difficulty with cuff pressures based on body habitus - Check ABG  Diabetes mellitus Hyperglycemia -Every 4 hours sliding scale, aggressive -Patient is n.p.o. at this time we will add long-acting regimen once diet resumed -hold home oral agents  Chronic pain -Hold her home narcotics at this time along with gabapentin, Soma and amitriptyline -Continue Cymbalta -Remove fentanyl patch if currently applied  Hypothyroidism - continue synthroid 15063mdaily  Best Practice (right click and "Reselect all SmartList Selections" daily)   Diet/type: NPO w/ oral meds DVT prophylaxis: prophylactic heparin  GI prophylaxis: N/A Lines: Arterial Line Foley:  Yes, and it is still needed Code Status:  full code Last date of  multidisciplinary goals of care discussion [10/4 with brother at bedside]  Labs   CBC: Recent Labs  Lab 12/26/20 0102  WBC 11.7*  NEUTROABS 6.7  HGB 12.5  HCT 39.6  MCV 97.8  PLT 166    Basic Metabolic Panel: Recent Labs  Lab 12/26/20 0102 12/26/20 1000 12/26/20 1619  NA 128* 129* 131*  K 4.6 4.9 4.3  CL 97* 97* 103  CO2 19* 18* 17*  GLUCOSE 523* 427* 330*  BUN 55* 58* 58*  CREATININE 5.45* 6.35* 5.74*  CALCIUM 7.9* 7.9* 7.0*   GFR: CrCl cannot be calculated (Unknown ideal weight.). Recent Labs  Lab 12/26/20 0102  WBC 11.7*    Liver Function Tests: No results for input(s): AST, ALT, ALKPHOS, BILITOT, PROT, ALBUMIN in the last 168 hours. No results for input(s): LIPASE, AMYLASE in the last 168 hours. No results for input(s): AMMONIA in the last 168 hours.  ABG    Component Value Date/Time   PHART 7.235 (L) 03/27/2020 0408   PCO2ART 50.2 (H) 03/27/2020 0408   PO2ART 115 (H) 03/27/2020 0408   HCO3 21.3 03/27/2020 0408   TCO2 23 03/27/2020 0408   ACIDBASEDEF 6.0 (H) 03/27/2020 0408   O2SAT 98.0 03/27/2020 0408     Coagulation Profile: No results for input(s): INR, PROTIME in the last 168 hours.  Cardiac Enzymes: No results for input(s): CKTOTAL, CKMB, CKMBINDEX, TROPONINI in the last 168 hours.  HbA1C: Hgb A1c MFr Bld  Date/Time Value Ref Range Status  03/08/2020 03:03 AM 11.9 (H) 4.8 - 5.6 % Final    Comment:    (NOTE) Pre diabetes:          5.7%-6.4%  Diabetes:              >6.4%  Glycemic control for   <7.0% adults with diabetes   03/25/2019 02:38 AM 10.9 (H) 4.8 - 5.6 % Final    Comment:    (NOTE) Pre diabetes:          5.7%-6.4% Diabetes:              >6.4% Glycemic control for   <7.0% adults with diabetes     CBG: Recent Labs  Lab 12/26/20 0841 12/26/20 0902 12/26/20 1022 12/26/20 1530 12/26/20 1636  GLUCAP 419* 416* 404* 311* 280*    Review of Systems:   Review of Systems  Constitutional:  Positive for  malaise/fatigue. Negative for chills, fever and weight loss.  HENT:  Negative for congestion, sinus pain and sore throat.   Eyes: Negative.   Respiratory:  Negative for cough, hemoptysis, sputum production, shortness of breath and wheezing.   Cardiovascular:  Negative for chest pain, palpitations, orthopnea, claudication and leg swelling.  Gastrointestinal:  Negative for abdominal pain, heartburn, nausea and vomiting.  Genitourinary:  Negative for dysuria, flank pain and hematuria.       Decreased urine output  Musculoskeletal:  Negative for joint pain and myalgias.  Skin:  Negative for rash.  Neurological:  Positive for tremors. Negative for weakness.  Endo/Heme/Allergies: Negative.   Psychiatric/Behavioral: Negative.      Past Medical History:  She,  has a past medical history of Acute renal failure (ARF) ([place]) (09/02/2012), Anemia, Anxiety, Back pain, Blood transfusion, Chronic heel ulcer ([place]), Chronic kidney disease, Chronic pain syndrome, Depression, Diabetes mellitus, DJD (degenerative joint disease), Dysrhythmia, Fibromyalgia, GERD (gastroesophageal reflux disease), H/O: Bell's palsy (2011), Heart murmur, History of kidney  stones, History of MRSA infection ([facility]), Hypertension, Hypothyroidism, Hypoventilation associated with obesity syndrome ([place]), Migraine, Non-healing non-surgical wound, Peripheral vascular disease ([place]), PONV (postoperative nausea and vomiting), Right foot drop, Shortness of breath, Sleep apnea, and Umbilical hernia.   Surgical History:   Past Surgical History:  Procedure Laterality Date   BACK SURGERY     for lumbar disc disease [place]   Tumor removed- has steel plate   BRAIN SURGERY      Plating due to soft spot closing too early- age 66   BREAST LUMPECTOMY WITH NEEDLE LOCALIZATION Right 08/23/2013   Procedure: RIGHT BREAST LUMPECTOMY WITH NEEDLE LOCALIZATION;  Surgeon: Odis Hollingshead, MD;  Location: [facility];  Service: General;   Laterality: Right;   CHOLECYSTECTOMY  2595   EXCISION UMBILICAL NODULE N/A 6/38/7564   Procedure: EXCISION OF UMBILICUS;  Surgeon: Coralie Keens, MD;  Location: [facility];  Service: General;  Laterality: N/A;   EYE SURGERY     eye lift   I & D EXTREMITY  04/02/2011   Procedure: IRRIGATION AND DEBRIDEMENT EXTREMITY;  Surgeon: Mcarthur Rossetti;  Location: [place];  Service: Orthopedics;  Laterality: Right;  I&D right heel ulcer, placement of A-cell graft   INCISION AND DRAINAGE ABSCESS N/A 10/28/2017   Procedure: INCISION AND DRAINAGE UMBILICAL HERNIA ABSCESS;  Surgeon: Ralene Ok, MD;  Location: [facility];  Service: General;  Laterality: N/A;   INCISION AND DRAINAGE OF WOUND  08/29/2011   Procedure: IRRIGATION AND DEBRIDEMENT WOUND;  Surgeon: Theodoro Kos, DO;  Location: [place] [facility];  Service: Plastics;  Laterality: Right;   INSERTION OF MESH N/A 10/16/2017   Procedure: INSERTION OF MESH;  Surgeon: Coralie Keens, MD;  Location: [facility];  Service: General;  Laterality: N/A;   LITHOTRIPSY     2007ish   right elbow     UMBILICAL HERNIA REPAIR N/A 10/16/2017   Procedure: UMBILICAL HERNIA REPAIR WITH MESH;  Surgeon: Coralie Keens, MD;  Location: [facility];  Service: General;  Laterality: N/A;     Social History:   reports that she has never smoked. She has never used smokeless tobacco. She reports that she does not drink alcohol and does not use drugs.   Family History:  Her family history includes Anesthesia problems in her mother; Diabetes type II in her father and mother; Heart attack in her father and mother; Hypertension in her mother; Peripheral vascular disease in her father and mother.   Allergies Allergies  Allergen Reactions   Penicillins Hives and Other (See Comments)    HAS PT DEVELOPED SEVERE RASH INVOLVING MUCUS MEMBRANES or SKIN NECROSIS: #  #  YES  #  # PATIENT HAS HAD A PCN REACTION THAT REQUIRED HOSPITALIZATION:  #  #  YES  #  #   Tolerates amoxicillin on multiple  occasions per Dr. Darrel Hoover note 11/01/17.  TDD.   Clindamycin/Lincomycin Hives, Dermatitis and Rash   Nsaids Other (See Comments)    Avoid due to kidney failure caused by celebrex    Sulfa Antibiotics Hives   Versed [Midazolam] Nausea And Vomiting    Pt had medication on October 16 2017 with no issues     Home Medications  Prior to Admission medications   Medication Sig Start Date End Date Taking? Authorizing Provider  acetaminophen (TYLENOL) 325 MG tablet Take 2 tablets (650 mg total) by mouth every 6 (six) hours as needed for mild pain (or Fever >/= 101). 04/03/20  Yes Mikhail, Velta Addison, DO  amitriptyline (ELAVIL) 25 MG tablet Take 1 tablet (25 mg total) by mouth at bedtime. 04/03/20  Yes Mikhail, Velta Addison, DO  atorvastatin (LIPITOR) 20 MG tablet Take 20 mg by mouth daily.   Yes [provider]  Biotin 10000 MCG TABS Take 10,000 mcg by mouth daily.   Yes [provider]  carisoprodol (SOMA) 350 MG tablet Take 1 tablet (350 mg total) by mouth 2 (two) times daily as needed (muscle spasms). Patient taking differently: Take 350 mg by mouth 2 (two) times daily as needed for muscle spasms. 04/03/20  Yes Mikhail, Velta Addison, DO  cetirizine (ZYRTEC) 10 MG tablet Take 10 mg by mouth daily.   Yes [provider]  Cyanocobalamin (VITAMIN DEFICIENCY SYSTEM-B12) 1000 MCG/ML KIT Inject 1,000 mcg as directed every 30 (thirty) days.   Yes [provider]  Diclofenac Sodium 2 % SOLN Place 2-4 g onto the skin 4 (four) times daily as needed. Patient taking differently: Place 4 g onto the skin 4 (four) times daily as needed (for pain). 10/08/18  Yes Mcarthur Rossetti, MD  DULoxetine (CYMBALTA) 60 MG capsule Take 60 mg by mouth 2 (two) times daily.   Yes [provider]  eletriptan (RELPAX) 40 MG tablet Take 40 mg by mouth as needed for migraine or headache (and may repeat in 2 hours if headache persists or recurs).   Yes [provider]  fentaNYL (DURAGESIC) 50  MCG/HR Place 1 patch onto the skin every 3 (three) days. 04/04/20  Yes Mikhail, Maryann, DO  gabapentin (NEURONTIN) 600 MG tablet Take 0.5 tablets (300 mg total) by mouth 3 (three) times daily. 04/03/20  Yes Mikhail, [place], DO  hydrOXYzine (VISTARIL) 25 MG capsule Take 25 mg by mouth 3 (three) times daily as needed for anxiety.   Yes [provider]  insulin aspart protamine - aspart (NOVOLOG MIX 70/30 FLEXPEN) (70-30) 100 UNIT/ML FlexPen Inject 75 Units into the skin 2 (two) times daily.   Yes [provider]  levETIRAcetam (KEPPRA) 750 MG tablet Take 750 mg by mouth 2 (two) times daily. 03/04/19  Yes [provider]  levothyroxine (SYNTHROID) 150 MCG tablet Take 150 mcg by mouth daily before breakfast.   Yes [provider]  lidocaine (LIDODERM) 5 % Place 3 patches onto the skin See admin instructions. Place 3 patches daily to affected areas and replace every 24 hours 05/30/17  Yes [provider]  NARCAN 4 MG/0.1ML LIQD nasal spray kit Place 4 mg into the nose daily as needed (opioid overdose). 10/22/17  Yes [provider]  nystatin (MYCOSTATIN/NYSTOP) 100000 UNIT/GM POWD Apply 1 g topically daily as needed (to affected areas).   Yes [provider]  Omega-3 1000 MG CAPS Take 1,000 mg by mouth 2 (two) times daily.   Yes [provider]  Oxycodone HCl 10 MG TABS Take 1 tablet (10 mg total) by mouth 4 (four) times daily as needed (for pain). 04/03/20  Yes Mikhail, Maryann, DO  pantoprazole (PROTONIX) 40 MG tablet Take 1 tablet (40 mg total) by mouth daily. 04/04/20  Yes Mikhail, Velta [place], DO  promethazine (PHENERGAN) 25 MG tablet Take 25 mg by mouth every 6 (six) hours as needed for nausea or vomiting.   Yes [provider]  senna (SENOKOT) 8.6 MG TABS tablet Take 8.6 mg by mouth daily as needed for mild constipation.    Yes [provider]  sitaGLIPtin (JANUVIA) 50 MG tablet Take 50 mg by mouth daily.   Yes [provider]  TRULICITY 1.5 FY/9.2KM SOPN Inject 3 mg into the skin every Thursday. 11/24/18  Yes [provider]     Critical care time: 45 minutes    Freda Jackson, MD [place] Office: [phone]   See Amion for personal pager PCCM on call pager [phone] until 7pm. Please call Elink 7p-7a. [phone]

## 2020-12-26 NOTE — ED Notes (Signed)
Patient asked this NT if she could take home insulin for her blood sugar. States its high and she doesn't want to go into a coma. Rob, [place] states this is fine if she has it. Informed patient. Patient states "cant they give me any in there? I shouldn't have to go home to get medicine." Informed patient that any medicines would be apart of treatment plan and she needed a treatment room in order to finish up her care.

## 2020-12-26 NOTE — ED Triage Notes (Signed)
Pt arrived with EMS, hasn't urinated since Saturday evening. Reports generalized weakeness and fatigue  CBG 564, bp 100/54, pulse 98, spo2 93%  RA

## 2020-12-26 NOTE — ED Provider Notes (Signed)
[facility] EMERGENCY DEPARTMENT Provider Note   CSN: [id number] Arrival date & time: 12/26/20  0050     History Chief Complaint  Patient presents with   Urinary Retention    [name] is a 50 y.o. female.  50 year old female with prior medical history as detailed below presents for evaluation.  Patient reports minimal to no urine production over the last 2 to 3 days.  Patient complains of achy pain all over.  Patient reports prior history of renal failure.  She reports that her symptoms today are concerning for prior episodes of renal failure.  Patient denies associated fever.  She denies chest pain or shortness of breath.  She denies abdominal pain. Patient reports prior admission in January for renal failure.  She reports that her renal failure was secondary to use of gabapentin and elevated blood glucose.  She reports brief course of inpatient dialysis.  She denies any outpatient follow-up with nephrology after this admission in January 2022.  She denies new medication use in the last month.   The history is provided by the patient.  Illness Location:  Myalgia, decreased urine output Severity:  Moderate Onset quality:  Gradual Duration:  2 days Timing:  Constant Progression:  Worsening Chronicity:  Recurrent     Past Medical History:  Diagnosis Date   Acute renal failure (ARF) ([place]) 09/02/2012   Anemia    Anxiety    panic attacks   Back pain    Blood transfusion    Chronic heel ulcer ([place])    Chronic kidney disease    Chronic pain syndrome    Depression    Diabetes mellitus    type II    DJD (degenerative joint disease)    Dysrhythmia    pt unsure what this was   Fibromyalgia    GERD (gastroesophageal reflux disease)    H/O: Bell's palsy 2011   Heart murmur    "slight one"   History of kidney stones    History of MRSA infection OF ULCER   Hypertension    no longer taken since 10-11 months per patient at preop phone call of  10/13/2017    Hypothyroidism    Hypoventilation associated with obesity syndrome (HCC)    Migraine    Non-healing non-surgical wound    Right hip, has Wound vac to hip.  Started as a skin tear.   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   AKI (acute kidney injury) ([place]) 03/07/2020   Seizures ([place]) 03/07/2020   Acute lower UTI 03/07/2020   Tremor 03/07/2020   History of 2019 novel coronavirus disease (COVID-19) 03/25/2019   Depression with anxiety 03/25/2019   Chest pain 03/24/2019   Postoperative abscess 24/40/1027   Umbilical hernia, incarcerated, s/p repair 10/16/2017 10/16/2017   Chronic pain of both knees 04/23/2017   Unilateral primary osteoarthritis, left knee 10/24/2016   Unilateral primary osteoarthritis, right knee 10/24/2016   Abnormal mammogram with microcalcification-Right inner lower quadrant 07/21/2013   Oliguria and  anuria 09/02/2012   Constipation 03/02/2011   Lethargy 03/01/2011   Heel ulcer due to DM ([place]) 02/19/2011   Wound, open, hip or thigh 02/19/2011   Healing pressure ulcer stage III ([place]) 02/18/2011   Morbidly obese ([place]) 02/18/2011   Diabetes mellitus type 2 with complications, uncontrolled ([place]) 02/18/2011   Sleep apnea 02/18/2011   Hypothyroidism 02/18/2011   Chronic pain 02/18/2011   Acute lymphangitis 02/18/2011   Cellulitis of foot 02/18/2011    Past Surgical History:  Procedure Laterality Date   BACK SURGERY     for lumbar disc disease [place]   Tumor removed- has steel plate   BRAIN SURGERY      Plating due to soft spot  closing too early- age 46   BREAST LUMPECTOMY WITH NEEDLE LOCALIZATION Right 08/23/2013   Procedure: RIGHT BREAST LUMPECTOMY WITH NEEDLE LOCALIZATION;  Surgeon: Odis Hollingshead, MD;  Location: [place];  Service: General;  Laterality: Right;   CHOLECYSTECTOMY  3154   EXCISION UMBILICAL NODULE N/A 0/10/6759   Procedure: EXCISION OF UMBILICUS;  Surgeon: Coralie Keens, MD;  Location: [facility];  Service: General;  Laterality: N/A;   EYE SURGERY     eye lift   I & D EXTREMITY  04/02/2011   Procedure: IRRIGATION AND DEBRIDEMENT EXTREMITY;  Surgeon: Mcarthur Rossetti;  Location: [place];  Service: Orthopedics;  Laterality: Right;  I&D right heel ulcer, placement of A-cell graft   INCISION AND DRAINAGE ABSCESS N/A 10/28/2017   Procedure: INCISION AND DRAINAGE UMBILICAL HERNIA ABSCESS;  Surgeon: Ralene Ok, MD;  Location: [place];  Service: General;  Laterality: N/A;   INCISION AND DRAINAGE OF WOUND  08/29/2011   Procedure: IRRIGATION AND DEBRIDEMENT WOUND;  Surgeon: Theodoro Kos, DO;  Location: [place];  Service: Plastics;  Laterality: Right;   INSERTION OF MESH N/A 10/16/2017   Procedure: INSERTION OF MESH;  Surgeon: Coralie Keens, MD;  Location: [facility];  Service: General;  Laterality: N/A;   LITHOTRIPSY     2007ish   right elbow     UMBILICAL HERNIA REPAIR N/A 10/16/2017   Procedure: UMBILICAL HERNIA REPAIR WITH MESH;  Surgeon: Coralie Keens, MD;  Location: [facility];  Service: General;  Laterality: N/A;     OB History   No obstetric history on file.     Family History  Problem Relation Age of Onset   Diabetes type II Father    Heart attack Father    Peripheral vascular disease Father    Diabetes type II Mother    Anesthesia problems Mother    Heart attack Mother    Peripheral vascular disease Mother    Hypertension Mother     Social History   Tobacco Use   Smoking status: Never   Smokeless tobacco: Never  Vaping Use   Vaping Use: Never used  Substance Use Topics   Alcohol  use: No   Drug use: No    Home Medications Prior to Admission medications   Medication Sig Start Date End Date Taking? Authorizing Provider  acetaminophen (TYLENOL) 325 MG tablet Take 2 tablets (650 mg total) by mouth every 6 (six) hours as needed for mild pain (or Fever >/= 101). 04/03/20   Mikhail, Velta Addison, DO  amitriptyline (ELAVIL) 25 MG tablet Take 1 tablet (25 mg total) by mouth at bedtime. 04/03/20   Mikhail, Velta Addison, DO  atorvastatin (LIPITOR) 20 MG tablet Take 20 mg by mouth daily.    [provider]  Biotin 10000  MCG TABS Take 10,000 mcg by mouth daily.    [provider]  carisoprodol (SOMA) 350 MG tablet Take 1 tablet (350 mg total) by mouth 2 (two) times daily as needed (muscle spasms). 04/03/20   Mikhail, Velta Addison, DO  cetirizine (ZYRTEC) 10 MG tablet Take 10 mg by mouth daily.    [provider]  Cyanocobalamin (VITAMIN DEFICIENCY SYSTEM-B12) 1000 MCG/ML KIT Inject 1,000 mcg as directed every 30 (thirty) days.    [provider]  Diclofenac Sodium 2 % SOLN Place 2-4 g onto the skin 4 (four) times daily as needed. Patient taking differently: Place 4 g onto the skin 4 (four) times daily as needed (for pain). 10/08/18   Mcarthur Rossetti, MD  DULoxetine (CYMBALTA) 60 MG capsule Take 60 mg by mouth 2 (two) times daily.    [provider]  eletriptan (RELPAX) 40 MG tablet Take 40 mg by mouth as needed for migraine or headache (and may repeat in 2 hours if headache persists or recurs).    [provider]  fentaNYL (DURAGESIC) 50 MCG/HR Place 1 patch onto the skin every 3 (three) days. 04/04/20   Mikhail, Velta Addison, DO  gabapentin (NEURONTIN) 600 MG tablet Take 0.5 tablets (300 mg total) by mouth 3 (three) times daily. 04/03/20   Mikhail, Velta Addison, DO  hydrOXYzine (VISTARIL) 25 MG capsule Take 25 mg by mouth 3 (three) times daily as needed for anxiety.    [provider]  insulin aspart protamine - aspart (NOVOLOG MIX 70/30  FLEXPEN) (70-30) 100 UNIT/ML FlexPen Inject 75 Units into the skin 2 (two) times daily.    [provider]  levETIRAcetam (KEPPRA) 750 MG tablet Take 750 mg by mouth 2 (two) times daily. 03/04/19   [provider]  levothyroxine (SYNTHROID) 150 MCG tablet Take 150 mcg by mouth daily before breakfast.    [provider]  lidocaine (LIDODERM) 5 % Place 3 patches onto the skin See admin instructions. Place 3 patches daily to affected areas and replace every 24 hours 05/30/17   [provider]  NARCAN 4 MG/0.1ML LIQD nasal spray kit Place 4 mg into the nose daily as needed (opioid overdose). 10/22/17   [provider]  nystatin (MYCOSTATIN/NYSTOP) 100000 UNIT/GM POWD Apply 1 g topically daily as needed (to affected areas).    [provider]  Omega-3 1000 MG CAPS Take 1,000 mg by mouth 2 (two) times daily.    [provider]  Oxycodone HCl 10 MG TABS Take 1 tablet (10 mg total) by mouth 4 (four) times daily as needed (for pain). 04/03/20   Mikhail, Velta Addison, DO  pantoprazole (PROTONIX) 40 MG tablet Take 1 tablet (40 mg total) by mouth daily. 04/04/20   Mikhail, Velta Addison, DO  promethazine (PHENERGAN) 25 MG tablet Take 25 mg by mouth every 6 (six) hours as needed for nausea or vomiting.    [provider]  senna (SENOKOT) 8.6 MG TABS tablet Take 8.6 mg by mouth daily as needed for mild constipation.     [provider]  sitaGLIPtin (JANUVIA) 50 MG tablet Take 50 mg by mouth daily.    [provider]  TRULICITY 1.5 MW/4.1LK SOPN Inject 3 mg into the skin every Thursday. 11/24/18   [provider]    Allergies    Penicillins, Clindamycin/lincomycin, Nsaids, Sulfa antibiotics, and Versed [midazolam]  Review of Systems   Review of Systems  All other systems reviewed and are negative.  Physical Exam Updated Vital Signs BP (!) 103/46 (  BP Location: Left Arm)   Pulse (!) 104   Temp 98.9 F (37.2 C) (Oral)   Resp  (!) 22   SpO2 95%   Physical Exam Vitals and nursing note reviewed.  Constitutional:      General: She is not in acute distress.    Appearance: Normal appearance. She is well-developed.     Comments: Morbidly obese  HENT:     Head: Normocephalic and atraumatic.  Eyes:     Conjunctiva/sclera: Conjunctivae normal.     Pupils: Pupils are equal, round, and reactive to light.  Cardiovascular:     Rate and Rhythm: Normal rate and regular rhythm.     Heart sounds: Normal heart sounds.  Pulmonary:     Effort: Pulmonary effort is normal. No respiratory distress.     Breath sounds: Normal breath sounds.  Abdominal:     General: There is no distension.     Palpations: Abdomen is soft.     Tenderness: There is no abdominal tenderness.  Musculoskeletal:        General: No deformity. Normal range of motion.     Cervical back: Normal range of motion and neck supple.  Skin:    General: Skin is warm and dry.  Neurological:     General: No focal deficit present.     Mental Status: She is alert and oriented to person, place, and time.    ED Results / Procedures / Treatments   Labs (all labs ordered are listed, but only abnormal results are displayed) Labs Reviewed  CBC WITH DIFFERENTIAL/PLATELET - Abnormal; Notable for the following components:      Result Value   WBC 11.7 (*)    All other components within normal limits  BASIC METABOLIC PANEL - Abnormal; Notable for the following components:   Sodium 128 (*)    Chloride 97 (*)    CO2 19 (*)    Glucose, Bld 523 (*)    BUN 55 (*)    Creatinine, Ser 5.45 (*)    Calcium 7.9 (*)    GFR, Estimated 9 (*)    All other components within normal limits  URINALYSIS, ROUTINE W REFLEX MICROSCOPIC - Abnormal; Notable for the following components:   Color, Urine AMBER (*)    APPearance CLOUDY (*)    Glucose, UA 50 (*)    Bilirubin Urine SMALL (*)    Ketones, ur 5 (*)    All other components within normal limits  BASIC METABOLIC PANEL -  Abnormal; Notable for the following components:   Sodium 129 (*)    Chloride 97 (*)    CO2 18 (*)    Glucose, Bld 427 (*)    BUN 58 (*)    Creatinine, Ser 6.35 (*)    Calcium 7.9 (*)    GFR, Estimated 7 (*)    All other components within normal limits  CBG MONITORING, ED - Abnormal; Notable for the following components:   Glucose-Capillary 419 (*)    All other components within normal limits  CBG MONITORING, ED - Abnormal; Notable for the following components:   Glucose-Capillary 416 (*)    All other components within normal limits  CBG MONITORING, ED - Abnormal; Notable for the following components:   Glucose-Capillary 404 (*)    All other components within normal limits  RESP PANEL BY RT-PCR (FLU A&B, COVID) ARPGX2  URINE CULTURE    EKG None  Radiology DG Chest 2 View  Result Date: 12/26/2020 CLINICAL DATA:  Hypoxia and  urinary retention EXAM: CHEST - 2 VIEW COMPARISON:  03/27/2020 FINDINGS: Cardiac shadow is mildly prominent but stable. Lungs are well aerated bilaterally. No focal infiltrate or effusion is seen. Mild degenerative changes of the thoracic spine are noted. IMPRESSION: No acute abnormality noted. Electronically Signed   By: Inez Catalina M.D.   On: 12/26/2020 01:52    Procedures Procedures   Medications Ordered in ED Medications  sodium chloride 0.9 % bolus 1,000 mL (has no administration in time range)    ED Course  I have reviewed the triage vital signs and the nursing notes.  Pertinent labs & imaging results that were available during my care of the patient were reviewed by me and considered in my medical decision making (see chart for details).    MDM Rules/Calculators/A&P                           MDM  MSE complete  [name] was evaluated in Emergency Department on 12/26/2020 for the symptoms described in the history of present illness. She was evaluated in the context of the global COVID-19 pandemic, which necessitated consideration  that the patient might be at risk for infection with the SARS-CoV-2 virus that causes COVID-19. Institutional protocols and algorithms that pertain to the evaluation of patients at risk for COVID-19 are in a state of rapid change based on information released by regulatory bodies including the CDC and federal and state organizations. These policies and algorithms were followed during the patient's care in the ED.   Patient presents with concern for possible renal failure.  Patient's labs are concerning given elevated creatinine (6.35) above baseline.  Potassium is 4.9.  Placement of Foley delivered approximately 100 cc of urine.  Patient also noted to be hyperglycemic without open anion gap.  Patient would benefit from admission for further work-up and treatment.  Nephrology aware of case and will consult.  Medicine services aware of  case and will evaluate for admission.   Final Clinical Impression(s) / ED Diagnoses Final diagnoses:  Acute renal failure, unspecified acute renal failure type ([place])  Hyperglycemia    Rx / DC Orders ED Discharge Orders     None        Valarie Merino, MD 12/26/20 1217

## 2020-12-26 NOTE — ED Provider Notes (Signed)
Emergency Medicine Provider Triage Evaluation Note  [name] , a 50 y.o. female  was evaluated in triage.  Pt complains of urinary retention, body aches.  Has hx of kidney failure.  States this feels the same.  Hasn't urinated in 2 days.  Review of Systems  Positive: Urinary retention, body aches Negative: Fever, chills  Physical Exam  BP 122/65 (BP Location: Right Arm)   Pulse (!) 108   Temp 98.9 F (37.2 C) (Oral)   Resp 17   SpO2 (!) 89%  Gen:   Awake, ill appearing Resp:  Normal effort  MSK:   Moves extremities without difficulty  Other:  tremors  Medical Decision Making  Medically screening exam initiated at 12:57 AM.  Appropriate orders placed.  [name] was informed that the remainder of the evaluation will be completed by another provider, this initial triage assessment does not replace that evaluation, and the importance of remaining in the ED until their evaluation is complete. Urinary retention   Heather Circle, PA-C 12/26/20 [place], April, MD 12/26/20 [phone]

## 2020-12-26 NOTE — ED Notes (Signed)
Winters MD at bedside

## 2020-12-26 NOTE — ED Notes (Signed)
CCM provider at bedside

## 2020-12-26 NOTE — ED Notes (Signed)
[name] [phone] ( cousin) call her for any updates

## 2020-12-26 NOTE — Procedures (Signed)
Arterial Catheter Insertion Procedure Note  [name]  [id number]  08-06-1970  Date:12/26/20  Time:7:05 PM    Provider Performing: Catha Brow    Procedure: Insertion of Arterial Line [phone]) without US guidance  Indication(s) Blood pressure monitoring and/or need for frequent ABGs  Consent Risks of the procedure as well as the alternatives and risks of each were explained to the patient and/or caregiver.  Consent for the procedure was obtained and is signed in the bedside chart  Anesthesia None   Time Out Verified patient identification, verified procedure, site/side was marked, verified correct patient position, special equipment/implants available, medications/allergies/relevant history reviewed, required imaging and test results available.   Sterile Technique Maximal sterile technique including full sterile barrier drape, hand hygiene, sterile gown, sterile gloves, mask, hair covering, sterile ultrasound probe cover (if used).   Procedure Description Area of catheter insertion was cleaned with chlorhexidine and draped in sterile fashion. With real-time ultrasound guidance an arterial catheter was placed into the left radial artery.  Appropriate arterial tracings confirmed on monitor.     Complications/Tolerance None; patient tolerated the procedure well.   EBL Minimal   Specimen(s) None

## 2020-12-27 DIAGNOSIS — R579 Shock, unspecified: Secondary | ICD-10-CM | POA: Diagnosis not present

## 2020-12-27 LAB — CBC
HCT: 35.6 % — ABNORMAL LOW (ref 36.0–46.0)
Hemoglobin: 11.7 g/dL — ABNORMAL LOW (ref 12.0–15.0)
MCH: 31.3 pg (ref 26.0–34.0)
MCHC: 32.9 g/dL (ref 30.0–36.0)
MCV: 95.2 fL (ref 80.0–100.0)
Platelets: 173 10*3/uL (ref 150–400)
RBC: 3.74 MIL/uL — ABNORMAL LOW (ref 3.87–5.11)
RDW: 13.1 % (ref 11.5–15.5)
WBC: 8.9 10*3/uL (ref 4.0–10.5)
nRBC: 0 % (ref 0.0–0.2)

## 2020-12-27 LAB — URINE CULTURE: Culture: NO GROWTH

## 2020-12-27 LAB — MAGNESIUM: Magnesium: 1.6 mg/dL — ABNORMAL LOW (ref 1.7–2.4)

## 2020-12-27 LAB — BASIC METABOLIC PANEL
Anion gap: 7 (ref 5–15)
BUN: 48 mg/dL — ABNORMAL HIGH (ref 6–20)
CO2: 20 mmol/L — ABNORMAL LOW (ref 22–32)
Calcium: 7.3 mg/dL — ABNORMAL LOW (ref 8.9–10.3)
Chloride: 107 mmol/L (ref 98–111)
Creatinine, Ser: 2.93 mg/dL — ABNORMAL HIGH (ref 0.44–1.00)
GFR, Estimated: 19 mL/min — ABNORMAL LOW (ref >60)
Glucose, Bld: 267 mg/dL — ABNORMAL HIGH (ref 70–99)
Potassium: 4.3 mmol/L (ref 3.5–5.1)
Sodium: 134 mmol/L — ABNORMAL LOW (ref 135–145)

## 2020-12-27 LAB — GLUCOSE, CAPILLARY
Glucose-Capillary: 222 mg/dL — ABNORMAL HIGH (ref 70–99)
Glucose-Capillary: 228 mg/dL — ABNORMAL HIGH (ref 70–99)
Glucose-Capillary: 232 mg/dL — ABNORMAL HIGH (ref 70–99)
Glucose-Capillary: 256 mg/dL — ABNORMAL HIGH (ref 70–99)
Glucose-Capillary: 301 mg/dL — ABNORMAL HIGH (ref 70–99)
Glucose-Capillary: 323 mg/dL — ABNORMAL HIGH (ref 70–99)

## 2020-12-27 LAB — CK: Total CK: 1158 U/L — ABNORMAL HIGH (ref 38–234)

## 2020-12-27 LAB — PHOSPHORUS: Phosphorus: 4.2 mg/dL (ref 2.5–4.6)

## 2020-12-27 LAB — BETA-HYDROXYBUTYRIC ACID: Beta-Hydroxybutyric Acid: 0.16 mmol/L (ref 0.05–0.27)

## 2020-12-27 MED ORDER — MAGNESIUM SULFATE 4 GM/100ML IV SOLN
4.0000 g | Freq: Once | INTRAVENOUS | Status: AC
  Administered 2020-12-27: 4 g via INTRAVENOUS
  Filled 2020-12-27: qty 100

## 2020-12-27 MED ORDER — OXYCODONE HCL 5 MG PO TABS
10.0000 mg | ORAL_TABLET | Freq: Four times a day (QID) | ORAL | Status: DC | PRN
  Administered 2020-12-27 – 2020-12-29 (×7): 10 mg via ORAL
  Filled 2020-12-27 (×7): qty 2

## 2020-12-27 MED ORDER — LIDOCAINE HCL (PF) 1 % IJ SOLN
INTRAMUSCULAR | Status: AC
  Filled 2020-12-27: qty 5

## 2020-12-27 MED ORDER — SENNA 8.6 MG PO TABS
1.0000 | ORAL_TABLET | Freq: Every day | ORAL | Status: DC
  Administered 2020-12-27 – 2020-12-28 (×2): 8.6 mg via ORAL
  Filled 2020-12-27 (×3): qty 1

## 2020-12-27 MED ORDER — FENTANYL 50 MCG/HR TD PT72
1.0000 | MEDICATED_PATCH | TRANSDERMAL | Status: DC
  Administered 2020-12-27 – 2020-12-30 (×2): 1 via TRANSDERMAL
  Filled 2020-12-27 (×2): qty 1

## 2020-12-27 MED ORDER — INSULIN DETEMIR 100 UNIT/ML ~~LOC~~ SOLN
25.0000 [IU] | Freq: Two times a day (BID) | SUBCUTANEOUS | Status: DC
  Administered 2020-12-27 (×2): 25 [IU] via SUBCUTANEOUS
  Filled 2020-12-27 (×5): qty 0.25

## 2020-12-27 MED ORDER — ORAL CARE MOUTH RINSE
15.0000 mL | Freq: Two times a day (BID) | OROMUCOSAL | Status: DC
  Administered 2020-12-27 – 2020-12-30 (×7): 15 mL via OROMUCOSAL

## 2020-12-27 MED ORDER — PHENOL 1.4 % MT LIQD
1.0000 | OROMUCOSAL | Status: DC | PRN
  Filled 2020-12-27: qty 177

## 2020-12-27 MED ORDER — LEVETIRACETAM 750 MG PO TABS
750.0000 mg | ORAL_TABLET | Freq: Two times a day (BID) | ORAL | Status: DC
  Administered 2020-12-27 – 2020-12-30 (×7): 750 mg via ORAL
  Filled 2020-12-27 (×7): qty 1

## 2020-12-27 NOTE — Progress Notes (Addendum)
   12/27/20 1357  Clinical Encounter Type  Visited With Patient  Visit Type Initial;Spiritual support  Referral From Nurse  Consult/Referral To Chaplain   Chaplain responded to request for an Advance Directive. Patient said she wants to appoint her brother as her 54. Chaplain provided AD education. Patient said she will let her nurse know once it is completed and ready for notary. Patient requested the chaplain to pray for her.  This note was prepared by Jeanine Luz, M.Div..  For questions please contact by phone [phone].

## 2020-12-27 NOTE — Procedures (Signed)
Arterial Catheter Insertion Procedure Note  [name]  [id number]  01/18/1971  Date:12/27/20  Time:11:11 PM    Provider Performing: Otilio Carpen [name]    Procedure: Insertion of Arterial Line [phone]) with US guidance ([id number])   Indication(s) Blood pressure monitoring and/or need for frequent ABGs  Consent Risks of the procedure as well as the alternatives and risks of each were explained to the patient and/or caregiver.  Consent for the procedure was obtained and is signed in the bedside chart  Anesthesia 1% Lidocaine   Time Out Verified patient identification, verified procedure, site/side was marked, verified correct patient position, special equipment/implants available, medications/allergies/relevant history reviewed, required imaging and test results available.   Sterile Technique Maximal sterile technique including full sterile barrier drape, hand hygiene, sterile gown, sterile gloves, mask, hair covering, sterile ultrasound probe cover (if used).   Procedure Description Area of catheter insertion was cleaned with chlorhexidine and draped in sterile fashion. With real-time ultrasound guidance an arterial catheter was placed into the right radial artery.  Appropriate arterial tracings confirmed on monitor.     Complications/Tolerance None; patient tolerated the procedure well.   EBL Minimal   Specimen(s) None   Otilio Carpen [name], PA-C

## 2020-12-27 NOTE — Progress Notes (Signed)
[facility] ADULT ICU REPLACEMENT PROTOCOL   The patient does apply for the [facility] Adult ICU Electrolyte Replacment Protocol based on the criteria listed below:   1.Exclusion criteria: TCTS patients, ECMO patients and Hypothermia Protocol, and   Dialysis patients 2. Is GFR >/= 30 ml/min?  No Patient's GFR today is 19 3. Is SCr </= 2? Yes.   Patient's SCr is 2.93 mg/dL 4. Did SCr increase >/= 0.5 in 24 hours? No. 5.Pt's weight >40kg  Yes.   6. Abnormal electrolyte(s): mag 1.6  7. Electrolytes replaced per protocol 8.  Call MD STAT for K+ </= 2.5, Phos </= 1, or Mag </= 1 Physician:  n/a   Darlys Gales 12/27/2020 5:01 AM

## 2020-12-27 NOTE — Progress Notes (Signed)
Patients arterial line not drawing back blood or getting a good reading. RTT came to assess. Art line found to be no longer in artery and was removed at this time. Will make E-Link aware at this time. New art line will be placed by RTT.

## 2020-12-27 NOTE — Progress Notes (Signed)
NAME:  [name], MRN:  [id number], DOB:  09/06/1970, LOS: 1 ADMISSION DATE:  12/26/2020, CONSULTATION DATE:  12/26/20 REFERRING MD: Maudie Mercury, MD  CHIEF COMPLAINT:  hypotension  History of Present Illness:  [name] is a 50 year old woman with CKD, fibromyalgia, GERD, hypertension and depression who presented to the hospital with decreased urine output over the past 2-3 days and noticed myoclonic jerks which she has had with prior kidney injury's. Her Cr is 6.35 today with a baseline around 0.8. CT Renal stone protocol is negative for hydronephrosis. Patient received fentanyl for pain and developed altered mentation with hypotension. Her mentation improved with narcan. PCCM has been consulted for the hypotension.  Patient's brother is at the bedside. Patient is somnolent but arousable to verbal stimuli. She denied being in any pain or having nausea, vomiting, dizzniness of feeling light headed.   She has received 5L of IV fluids in the ER. Nephrology has seen the patient and started her on LR at 161mL/hr. She has made around 34mL of urine since foley  placement.   Pertinent  Medical History  Chronic Kidney Disease Diabetes mellitus Depression Chronic Pain Syndrome GERD Anemia Hypertension Hypothyroidism OSA/OHS  Significant Hospital Events: Including procedures, antibiotic start and stop dates in addition to other pertinent events   10/4 admitted for Acute renal failure and hypotension  Interim History / Subjective:  Patient reports feeling well this morning. She is having some back and knee pain which is chronic. She is not on supplemental oxygen at home.   Objective   Blood pressure (!) 115/41, pulse 84, temperature 97.8 F (36.6 C), temperature source Oral, resp. rate 15, weight (!) 166.6 kg, SpO2 100 %.        Intake/Output Summary (Last 24 hours) at 12/27/2020 0716 Last data filed at 12/27/2020 0700 Gross per 24 hour  Intake 7194.88 ml  Output 3060 ml   Net 4134.88 ml    Filed Weights   12/26/20 1519 12/27/20 0406  Weight: (!) 155.1 kg (!) 166.6 kg    Examination: General: obese woman, awake and alert, no acute distress HENT: [place]/AT, sclera anicteric Lungs: Clear to auscultation bilaterally, no wheezing. Cardiovascular: regular rate and rhythm, S1-S2, no murmurs Abdomen: Soft, nontender, nondistended, bowel sounds present Extremities: No edema, warm Neuro: alert and oriented x3 GU: Foley catheter in place  Resolved Hospital Problem list   Hyponatremia Acute encephalopathy   Assessment & Plan:  Acute renal failure Hyponatremia, resolved  Non-anion gap Metabolic acidosis Likely prerenal etiology due to hypovolemia. Renal function improved with volume expansion, Cr 2.93<4.29. Corrected Na wnl, 137.  CT renal stone study unremarkable. -Nephrology following, appreciate recommendations -Continue LR at 100 mL/h, further boluses as needed -Check CK -No acute indication for renal replacement therapy at this time -Renally dosed medications, avoid nephrotoxic agents  Acute encephalopathy, resolved In setting of shock, acute renal failure and metabolic abnormalities. Resolved, she is alert and oriented today.   Shock In setting of hypovolemia and vasaplegia secondary to narcotic medication. She has received 5 L of crystalloid fluid resuscitation and now on continuous LR with improvement in her pressures. Weaning levophed as tolerated.  -Continue continuous IV fluids at 100 mL/h -PRN fluid boluses -Peripheral Levophed  Diabetes mellitus Hyperglycemia Blood sugars have improved, in the 200s today. Home medications include 70/30 75 units BID. Will start levemir 25 units BID and titrate as needed. Okay to resume diet today.  -Every 4 hours sliding scale -Levemir 25 units BID  Chronic pain -Restart home medications for back pain, fentanyl patch and oxycodone 10 mg qid PRN -Continue to hold gabapentin, Soma and  amitriptyline -Continue Cymbalta  Hypothyroidism - continue synthroid 115mcg daily  Best Practice (right click and "Reselect all SmartList Selections" daily)   Diet/type: Regular consistency (see orders) DVT prophylaxis: prophylactic heparin  GI prophylaxis: N/A Lines: Arterial Line Foley:  Yes, and it is still needed Code Status:  full code Last date of multidisciplinary goals of care discussion [10/5]  Labs   CBC: Recent Labs  Lab 12/26/20 0102 12/26/20 1956 12/26/20 2145 12/27/20 0310  WBC 11.7*  --  11.4* 8.9  NEUTROABS 6.7  --   --   --   HGB 12.5 11.6* 12.2 11.7*  HCT 39.6 34.0* 37.8 35.6*  MCV 97.8  --  96.4 95.2  PLT 188  --  182 173     Basic Metabolic Panel: Recent Labs  Lab 12/26/20 0102 12/26/20 1000 12/26/20 1619 12/26/20 1956 12/26/20 2145 12/27/20 0310  NA 128* 129* 131* 134* 132* 134*  K 4.6 4.9 4.3 4.9 4.7 4.3  CL 97* 97* 103  --  107 107  CO2 19* 18* 17*  --  17* 20*  GLUCOSE 523* 427* 330*  --  322* 267*  BUN 55* 58* 58*  --  51* 48*  CREATININE 5.45* 6.35* 5.74*  --  4.29* 2.93*  CALCIUM 7.9* 7.9* 7.0*  --  7.2* 7.3*  MG  --   --   --   --   --  1.6*  PHOS  --   --   --   --   --  4.2    GFR: CrCl cannot be calculated (Unknown ideal weight.). Recent Labs  Lab 12/26/20 0102 12/26/20 2117 12/26/20 2145 12/27/20 0310  WBC 11.7*  --  11.4* 8.9  LATICACIDVEN  --  1.0  --   --      Liver Function Tests: No results for input(s): AST, ALT, ALKPHOS, BILITOT, PROT, ALBUMIN in the last 168 hours. No results for input(s): LIPASE, AMYLASE in the last 168 hours. No results for input(s): AMMONIA in the last 168 hours.  ABG    Component Value Date/Time   PHART 7.190 (LL) 12/26/2020 1956   PCO2ART 47.3 12/26/2020 1956   PO2ART 97 12/26/2020 1956   HCO3 18.0 (L) 12/26/2020 1956   TCO2 19 (L) 12/26/2020 1956   ACIDBASEDEF 10.0 (H) 12/26/2020 1956   O2SAT 95.0 12/26/2020 1956      Coagulation Profile: No results for input(s): INR,  PROTIME in the last 168 hours.  Cardiac Enzymes: No results for input(s): CKTOTAL, CKMB, CKMBINDEX, TROPONINI in the last 168 hours.  HbA1C: Hgb A1c MFr Bld  Date/Time Value Ref Range Status  12/26/2020 06:08 PM 10.3 (H) 4.8 - 5.6 % Final    Comment:    (NOTE) Pre diabetes:          5.7%-6.4%  Diabetes:              >6.4%  Glycemic control for   <7.0% adults with diabetes   03/08/2020 03:03 AM 11.9 (H) 4.8 - 5.6 % Final    Comment:    (NOTE) Pre diabetes:          5.7%-6.4%  Diabetes:              >6.4%  Glycemic control for   <7.0% adults with diabetes     CBG: Recent Labs  Lab 12/26/20 1750 12/26/20 2041 12/26/20  2116 12/26/20 2355 12/27/20 0340  GLUCAP 268* 286* 292* 280* 228*     Review of Systems:   Review of Systems  Respiratory:  Negative for shortness of breath.   Cardiovascular:  Negative for chest pain and leg swelling.  Gastrointestinal:  Negative for abdominal pain, nausea and vomiting.  Musculoskeletal:  Positive for back pain and joint pain.  Neurological:  Negative for dizziness and headaches.        Harlow Ohms, DO PGY-3

## 2020-12-27 NOTE — Progress Notes (Signed)
[facility] Progress Note   Assessment/Plan **AKI, severe:  Has normal baseline renal function but 2 prior episodes of AKI, most recently 03/2020 which presented similarly and attributed to volume depletion in setting of polypharmacy  Her imaging and UA are reassuring. She's improved with volume repletion and BP support with creatinine improved from 5 to 3 today.  D/C IVF and follow.  Avoid nephrotoxins. Dose meds for renal function.   **Hypotension:  presumable hypovolemic based on course, now normotensive after volume resuscitation   **Metabolic acidosis: secondary to AKI: mild, follow for now.     **Hyponatremia, hypovolemic:  corrected to nearly normal for glucose and improved with volume expansion   **DM:  per primary.    **Chronic pain:  per primary.  Pervasive issue for her.  Seems polypharmacy and oversedation is contributing to her recurrent admissions with AKI     **h/o seizures   Will follow, page with issues.   Subjective:   Seen in ICU.  Feeling much better. Myoclonic jerks improved. I/Os 7L/3L yesterday. Required low dose NE overnight.  Did have code stroke overnight for L sided facial droop but has h/o Bells palsy and deficit attributed to that.   Objective Vitals:   12/27/20 0600 12/27/20 0615 12/27/20 0630 12/27/20 0645  BP:      Pulse: 88 86 87 85  Resp: 17 14 15 14   Temp:      TempSrc:      SpO2: 98% 100% 100% 100%  Weight:       Physical Exam Gen: comfortable in bed lying flat Eyes: anicteric ENT: MMM Neck: supple, thick CV:  RRR, 2+ radial pulses Abd:  soft, obese, nontender Lungs: clear ant, normal WOB lying GU: foley with yellow urine Extr: no edema Neuro: Aox3, much better concentration and fluent speech, no myoclonic jerking Skin: no rashes noted  Additional Objective Labs: Basic Metabolic Panel: Recent Labs  Lab 12/26/20 1619 12/26/20 1956 12/26/20 2145 12/27/20 0310  NA 131* 134* 132* 134*  K 4.3 4.9 4.7 4.3  CL 103   --  107 107  CO2 17*  --  17* 20*  GLUCOSE 330*  --  322* 267*  BUN 58*  --  51* 48*  CREATININE 5.74*  --  4.29* 2.93*  CALCIUM 7.0*  --  7.2* 7.3*  PHOS  --   --   --  4.2   Liver Function Tests: No results for input(s): AST, ALT, ALKPHOS, BILITOT, PROT, ALBUMIN in the last 168 hours. No results for input(s): LIPASE, AMYLASE in the last 168 hours. CBC: Recent Labs  Lab 12/26/20 0102 12/26/20 1956 12/26/20 2145 12/27/20 0310  WBC 11.7*  --  11.4* 8.9  NEUTROABS 6.7  --   --   --   HGB 12.5 11.6* 12.2 11.7*  HCT 39.6 34.0* 37.8 35.6*  MCV 97.8  --  96.4 95.2  PLT 188  --  182 173   Blood Culture    Component Value Date/Time   SDES URINE, CATHETERIZED 03/26/2020 2112   SPECREQUEST NONE 03/26/2020 2112   CULT  03/26/2020 2112    NO GROWTH Performed at [facility] [address] [place], [place] [address]    REPTSTATUS 03/28/2020 FINAL 03/26/2020 2112    Cardiac Enzymes: No results for input(s): CKTOTAL, CKMB, CKMBINDEX, TROPONINI in the last 168 hours. CBG: Recent Labs  Lab 12/26/20 1750 12/26/20 2041 12/26/20 2116 12/26/20 2355 12/27/20 0340  GLUCAP 268* 286* 292* 280* 228*   Iron  Studies: No results for input(s): IRON, TIBC, TRANSFERRIN, FERRITIN in the last 72 hours. @lablastinr3 @ Studies/Results: DG Chest 2 View  Result Date: 12/26/2020 CLINICAL DATA:  Hypoxia and urinary retention EXAM: CHEST - 2 VIEW COMPARISON:  03/27/2020 FINDINGS: Cardiac shadow is mildly prominent but stable. Lungs are well aerated bilaterally. No focal infiltrate or effusion is seen. Mild degenerative changes of the thoracic spine are noted. IMPRESSION: No acute abnormality noted. Electronically Signed   By: Inez Catalina M.D.   On: 12/26/2020 01:52   CT Renal Stone Study  Result Date: 12/26/2020 CLINICAL DATA:  50 year old female with history of flank pain. Suspected kidney stone. EXAM: CT ABDOMEN AND PELVIS WITHOUT CONTRAST TECHNIQUE: Multidetector CT imaging of the abdomen  and pelvis was performed following the standard protocol without IV contrast. COMPARISON:  CT the abdomen and pelvis 03/26/2020. FINDINGS: Lower chest: Areas of mild scarring are noted in the lung bases bilaterally. Hepatobiliary: Diffuse low attenuation throughout the hepatic parenchyma, indicative of severe hepatic steatosis. No discrete cystic or solid hepatic lesions are confidently identified on today's noncontrast CT examination. Status post cholecystectomy. Pancreas: No definite pancreatic mass or peripancreatic fluid collections or inflammatory changes are noted on today's noncontrast CT examination. Spleen: Unremarkable. Adrenals/Urinary Tract: There are no abnormal calcifications within the collecting system of either kidney, along the course of either ureter, or within the lumen of the urinary bladder. No hydroureteronephrosis or perinephric stranding to suggest urinary tract obstruction at this time. The unenhanced appearance of the kidneys is unremarkable bilaterally. Urinary bladder is completely decompressed around an indwelling Foley balloon catheter, but is otherwise normal in appearance. Bilateral adrenal glands are normal in appearance. Stomach/Bowel: Unenhanced appearance of the stomach is normal. No pathologic dilatation of small bowel or colon. Normal appendix. Vascular/Lymphatic: No atherosclerotic calcifications in the abdominal aorta or pelvic vasculature. No lymphadenopathy noted in the abdomen or pelvis. Reproductive: IUD present in the uterus. Uterus and ovaries are otherwise unremarkable in appearance. Other: No significant volume of ascites.  No pneumoperitoneum. Musculoskeletal: Mixed attenuation partially calcified mass in the medial aspect of the left gluteal region, similar to prior studies, measuring up to 7.4 x 4.5 cm on today's examination, compatible with a benign lesions such as fat necrosis or dystrophic posttraumatic calcifications. There are no aggressive appearing lytic or  blastic lesions noted in the visualized portions of the skeleton. IMPRESSION: 1. No acute findings are noted in the abdomen or pelvis to account for the patient's symptoms. Specifically, no urinary tract calculus and no findings of urinary tract obstruction. 2. Severe hepatic steatosis. 3. Additional incidental findings, as above. Electronically Signed   By: Vinnie Langton M.D.   On: 12/26/2020 12:03   Medications:  sodium chloride Stopped (12/27/20 0538)   sodium chloride     lactated ringers 100 mL/hr at 12/27/20 0600   magnesium sulfate bolus IVPB 50 mL/hr at 12/27/20 0600   norepinephrine (LEVOPHED) Adult infusion 4 mcg/min (12/27/20 0600)    Chlorhexidine Gluconate Cloth  6 each Topical Daily   DULoxetine  60 mg Oral BID   heparin  5,000 Units Subcutaneous Q8H   insulin aspart  0-20 Units Subcutaneous Q4H   levothyroxine  150 mcg Oral Q0600   lidocaine  1 patch Transdermal Q24H   mouth rinse  15 mL Mouth Rinse BID     Jannifer Hick MD 12/27/2020, 6:46 AM  [facility] Pager: [phone]

## 2020-12-27 NOTE — Progress Notes (Signed)
Gleason PA at bedside to place new arterial line at this time.

## 2020-12-27 NOTE — Progress Notes (Signed)
RT attempted A-line stick 2x but unable to keep the waveform as well as blood return. RN aware. RT will cont to monitor.

## 2020-12-28 ENCOUNTER — Inpatient Hospital Stay (HOSPITAL_COMMUNITY): Payer: Medicare HMO

## 2020-12-28 DIAGNOSIS — I9589 Other hypotension: Secondary | ICD-10-CM

## 2020-12-28 LAB — BASIC METABOLIC PANEL
Anion gap: 6 (ref 5–15)
BUN: 29 mg/dL — ABNORMAL HIGH (ref 6–20)
CO2: 21 mmol/L — ABNORMAL LOW (ref 22–32)
Calcium: 8.3 mg/dL — ABNORMAL LOW (ref 8.9–10.3)
Chloride: 107 mmol/L (ref 98–111)
Creatinine, Ser: 1.22 mg/dL — ABNORMAL HIGH (ref 0.44–1.00)
GFR, Estimated: 53 mL/min — ABNORMAL LOW (ref >60)
Glucose, Bld: 287 mg/dL — ABNORMAL HIGH (ref 70–99)
Potassium: 4.6 mmol/L (ref 3.5–5.1)
Sodium: 134 mmol/L — ABNORMAL LOW (ref 135–145)

## 2020-12-28 LAB — ECHOCARDIOGRAM COMPLETE
Area-P 1/2: 4.74 cm2
Height: 65.5 in
S' Lateral: 2.7 cm
Weight: 5834.25 oz

## 2020-12-28 LAB — MAGNESIUM: Magnesium: 2 mg/dL (ref 1.7–2.4)

## 2020-12-28 LAB — PROCALCITONIN: Procalcitonin: 0.1 ng/mL

## 2020-12-28 LAB — GLUCOSE, CAPILLARY
Glucose-Capillary: 279 mg/dL — ABNORMAL HIGH (ref 70–99)
Glucose-Capillary: 274 mg/dL — ABNORMAL HIGH (ref 70–99)
Glucose-Capillary: 302 mg/dL — ABNORMAL HIGH (ref 70–99)
Glucose-Capillary: 294 mg/dL — ABNORMAL HIGH (ref 70–99)
Glucose-Capillary: 286 mg/dL — ABNORMAL HIGH (ref 70–99)

## 2020-12-28 LAB — LACTIC ACID, PLASMA: Lactic Acid, Venous: 1 mmol/L (ref 0.5–1.9)

## 2020-12-28 LAB — CK: Total CK: 827 U/L — ABNORMAL HIGH (ref 38–234)

## 2020-12-28 LAB — PHOSPHORUS: Phosphorus: 2.6 mg/dL (ref 2.5–4.6)

## 2020-12-28 MED ORDER — LACTATED RINGERS IV SOLN: INTRAVENOUS | Status: AC

## 2020-12-28 MED ORDER — GABAPENTIN 300 MG PO CAPS
300.0000 mg | ORAL_CAPSULE | Freq: Three times a day (TID) | ORAL | Status: DC
  Administered 2020-12-28 – 2020-12-30 (×6): 300 mg via ORAL
  Filled 2020-12-28 (×6): qty 1

## 2020-12-28 MED ORDER — INSULIN ASPART 100 UNIT/ML IJ SOLN
0.0000 [IU] | Freq: Three times a day (TID) | INTRAMUSCULAR | Status: DC
  Administered 2020-12-28: 11 [IU] via SUBCUTANEOUS
  Administered 2020-12-28: 15 [IU] via SUBCUTANEOUS
  Administered 2020-12-29 (×2): 11 [IU] via SUBCUTANEOUS
  Administered 2020-12-29: 7 [IU] via SUBCUTANEOUS
  Administered 2020-12-30: 11 [IU] via SUBCUTANEOUS

## 2020-12-28 MED ORDER — INSULIN DETEMIR 100 UNIT/ML ~~LOC~~ SOLN
32.0000 [IU] | Freq: Two times a day (BID) | SUBCUTANEOUS | Status: DC
  Administered 2020-12-28 (×2): 32 [IU] via SUBCUTANEOUS
  Filled 2020-12-28 (×4): qty 0.32

## 2020-12-28 MED ORDER — HYDROMORPHONE HCL 1 MG/ML IJ SOLN
0.5000 mg | Freq: Once | INTRAMUSCULAR | Status: AC
  Administered 2020-12-28: 0.5 mg via INTRAVENOUS
  Filled 2020-12-28: qty 0.5

## 2020-12-28 MED ORDER — AMITRIPTYLINE HCL 25 MG PO TABS
25.0000 mg | ORAL_TABLET | Freq: Every day | ORAL | Status: DC
  Administered 2020-12-28 – 2020-12-29 (×2): 25 mg via ORAL
  Filled 2020-12-28 (×2): qty 1

## 2020-12-28 MED ORDER — INSULIN ASPART 100 UNIT/ML IJ SOLN
0.0000 [IU] | Freq: Every day | INTRAMUSCULAR | Status: DC
  Administered 2020-12-28: 3 [IU] via SUBCUTANEOUS
  Administered 2020-12-29: 4 [IU] via SUBCUTANEOUS

## 2020-12-28 MED ORDER — PERFLUTREN LIPID MICROSPHERE
1.0000 mL | INTRAVENOUS | Status: AC | PRN
  Administered 2020-12-28: 2 mL via INTRAVENOUS
  Filled 2020-12-28: qty 10

## 2020-12-28 NOTE — Progress Notes (Signed)
  Echocardiogram 2D Echocardiogram has been performed.  [name] 12/28/2020, 10:43 AM

## 2020-12-28 NOTE — Progress Notes (Addendum)
NAME:  [name], MRN:  [id number], DOB:  1971/03/21, LOS: 2 ADMISSION DATE:  12/26/2020, CONSULTATION DATE:  12/26/20 REFERRING MD: Maudie Mercury, MD  CHIEF COMPLAINT:  hypotension  History of Present Illness:  [name] is a 50 year old woman with CKD, fibromyalgia, GERD, hypertension and depression who presented to the hospital with decreased urine output over the past 2-3 days and noticed myoclonic jerks which she has had with prior kidney injury's. Her Cr is 6.35 today with a baseline around 0.8. CT Renal stone protocol is negative for hydronephrosis. Patient received fentanyl for pain and developed altered mentation with hypotension. Her mentation improved with narcan. PCCM has been consulted for the hypotension.  Patient's brother is at the bedside. Patient is somnolent but arousable to verbal stimuli. She denied being in any pain or having nausea, vomiting, dizzniness of feeling light headed.   She has received 5L of IV fluids in the ER. Nephrology has seen the patient and started her on LR at 133mL/hr. She has made around 332mL of urine since foley  placement.   Pertinent  Medical History  Chronic Kidney Disease Diabetes mellitus Depression Chronic Pain Syndrome GERD Anemia Hypertension Hypothyroidism OSA/OHS  Significant Hospital Events: Including procedures, antibiotic start and stop dates in addition to other pertinent events   10/4 admitted for Acute renal failure and hypotension 10/5 Arterial line replaced   Interim History / Subjective:  No acute events overnight.  Patient continues to have back pain.  Denies any other complaints at this time.  Objective   Blood pressure (!) 118/97, pulse 88, temperature 97.8 F (36.6 C), temperature source Oral, resp. rate 15, height 5' 5.5" (1.664 m), weight (!) 165.4 kg, SpO2 96 %.        Intake/Output Summary (Last 24 hours) at 12/28/2020 0723 Last data filed at 12/28/2020 0500 Gross per 24 hour  Intake 1235.9 ml   Output 5162 ml  Net -3926.1 ml    Filed Weights   12/26/20 1519 12/27/20 0406 12/28/20 0500  Weight: (!) 155.1 kg (!) 166.6 kg (!) 165.4 kg    Examination: General: obese woman, awake and alert, no acute distress HENT: [place]/AT, sclera anicteric Lungs: Clear to auscultation bilaterally, no wheezing. Cardiovascular: regular rate and rhythm, S1-S2, no murmurs Abdomen: Soft, nontender, nondistended, bowel sounds present Extremities: No edema, warm Neuro: alert and oriented x3 GU: Foley catheter in place  Resolved Hospital Problem list   Hyponatremia Acute encephalopathy  Non-anion gap metabolic acidosis  Assessment & Plan:  Acute renal failure, resolved Likely prerenal etiology due to hypovolemia.  Renal function nearly back to baseline with volume expansion, creatinine 1.2 to with a baseline of 0.8.  No indication for RRT.  CK mildly elevated up 1158.  Patient is tolerating oral intake.  -Nephrology following, appreciate recommendations -Renally dosed medications, avoid nephrotoxic agent  Shock Likely in setting of hypovolemia possibly related to hyperglycemia and vasaplegia secondary to narcotic medication.  She still requiring low doses of Levophed, possibly delayed response to hydration.  Weaning Levophed as tolerated.  Will check lactic acid, procalcitonin and echocardiogram. No signs or symptoms of infectious etiology.  Check pressures via cuff and if similar to pressures obtained by A-line can remove catheter. -Continuous LR infusion 75 mils per hour -Follow-up LA, procalcitonin echocardiogram -Peripheral Levophed, wean as tolerated for MAP goal greater than 65  Diabetes mellitus Hyperglycemia Blood sugars remain in the upper 200-300 range.. Home medications include 70/30 75 units BID. Will increase levemir to 32 units  BID and titrate as needed.  -SSI  -Levemir 32 units BID   Chronic back pain -Continue home meds fentanyl patch and oxycodone 10 mg qid PRN -IV Dilaudid  0.5 mg once  -Continue to hold gabapentin, Soma and amitriptyline -Continue Cymbalta  Hypothyroidism - continue synthroid 126mcg daily  Best Practice (right click and "Reselect all SmartList Selections" daily)   Diet/type: Regular consistency (see orders) DVT prophylaxis: prophylactic heparin  GI prophylaxis: N/A Lines: Arterial Line Foley:  Yes, and it is still needed Code Status:  full code Last date of multidisciplinary goals of care discussion [10/5]  Labs   CBC: Recent Labs  Lab 12/26/20 0102 12/26/20 1956 12/26/20 2145 12/27/20 0310  WBC 11.7*  --  11.4* 8.9  NEUTROABS 6.7  --   --   --   HGB 12.5 11.6* 12.2 11.7*  HCT 39.6 34.0* 37.8 35.6*  MCV 97.8  --  96.4 95.2  PLT 188  --  182 173     Basic Metabolic Panel: Recent Labs  Lab 12/26/20 1000 12/26/20 1619 12/26/20 1956 12/26/20 2145 12/27/20 0310 12/28/20 0400  NA 129* 131* 134* 132* 134* 134*  K 4.9 4.3 4.9 4.7 4.3 4.6  CL 97* 103  --  107 107 107  CO2 18* 17*  --  17* 20* 21*  GLUCOSE 427* 330*  --  322* 267* 287*  BUN 58* 58*  --  51* 48* 29*  CREATININE 6.35* 5.74*  --  4.29* 2.93* 1.22*  CALCIUM 7.9* 7.0*  --  7.2* 7.3* 8.3*  MG  --   --   --   --  1.6* 2.0  PHOS  --   --   --   --  4.2 2.6    GFR: Estimated Creatinine Clearance: 86.1 mL/min (A) (by C-G formula based on SCr of 1.22 mg/dL (H)). Recent Labs  Lab 12/26/20 0102 12/26/20 2117 12/26/20 2145 12/27/20 0310  WBC 11.7*  --  11.4* 8.9  LATICACIDVEN  --  1.0  --   --      Liver Function Tests: No results for input(s): AST, ALT, ALKPHOS, BILITOT, PROT, ALBUMIN in the last 168 hours. No results for input(s): LIPASE, AMYLASE in the last 168 hours. No results for input(s): AMMONIA in the last 168 hours.  ABG    Component Value Date/Time   PHART 7.190 (LL) 12/26/2020 1956   PCO2ART 47.3 12/26/2020 1956   PO2ART 97 12/26/2020 1956   HCO3 18.0 (L) 12/26/2020 1956   TCO2 19 (L) 12/26/2020 1956   ACIDBASEDEF 10.0 (H) 12/26/2020  1956   O2SAT 95.0 12/26/2020 1956      Coagulation Profile: No results for input(s): INR, PROTIME in the last 168 hours.  Cardiac Enzymes: Recent Labs  Lab 12/27/20 0310  CKTOTAL 1,158*    HbA1C: Hgb A1c MFr Bld  Date/Time Value Ref Range Status  12/26/2020 06:08 PM 10.3 (H) 4.8 - 5.6 % Final    Comment:    (NOTE) Pre diabetes:          5.7%-6.4%  Diabetes:              >6.4%  Glycemic control for   <7.0% adults with diabetes   03/08/2020 03:03 AM 11.9 (H) 4.8 - 5.6 % Final    Comment:    (NOTE) Pre diabetes:          5.7%-6.4%  Diabetes:              >6.4%  Glycemic control  for   <7.0% adults with diabetes     CBG: Recent Labs  Lab 12/27/20 1148 12/27/20 1522 12/27/20 2006 12/27/20 2330 12/28/20 0350  GLUCAP 222* 256* 323* 301* 279*     Review of Systems:   Review of Systems  Respiratory:  Negative for shortness of breath.   Cardiovascular:  Negative for chest pain and leg swelling.  Gastrointestinal:  Negative for abdominal pain, nausea and vomiting.  Musculoskeletal:  Positive for back pain and joint pain.  Neurological:  Negative for dizziness and headaches.        Harlow Ohms, DO PGY-3

## 2020-12-28 NOTE — Progress Notes (Signed)
[facility] Progress Note   Assessment/Plan **AKI, severe:  Has normal baseline renal function but 2 prior episodes of AKI, most recently 03/2020 which presented similarly and attributed to volume depletion in setting of polypharmacy  Her imaging and UA are reassuring. She's improved with volume repletion and BP support with creatinine improved from 5 to 1.2 in just 2 day.  Looks like she is in polyuric phase of tubular injury/AKI recovery.  Avoid nephrotoxins. Dose meds for renal function. We discussed the recurrent nature of her AKI which appears to be oversedation from meds leading to hypovolemia and AKI.  Asked she work with PCP and pain MD to minimize meds as able to prevent recurrent AKI.  I don't think specific nephrology f/u at this point is needed which we discussed.   **Hypotension:  presumable hypovolemic based on course, now normotensive after volume resuscitation   **Metabolic acidosis: secondary to AKI: mild, follow for now.     **Hyponatremia, hypovolemic:  corrected to nearly normal for glucose and improved with volume expansion   **DM:  per primary.    **Chronic pain:  per primary.  Pervasive issue for her.  Seems polypharmacy and oversedation is contributing to her recurrent admissions with AKI     **h/o seizures   Will sign off, page with issues.   Subjective:   Seen in ICU.  Feeling much better. Myoclonic jerks improved. I/Os 1.2L/5.2L yesterday. TTE pending. A line being manipulated currently - diff to check accurate BP.  Objective Vitals:   12/28/20 0700 12/28/20 0800 12/28/20 0810 12/28/20 0900  BP:      Pulse: 89 85  81  Resp: 10 14  17   Temp:   98.1 F (36.7 C)   TempSrc:   Oral   SpO2: 96% 96%  95%  Weight:      Height:       Physical Exam Gen: comfortable in bed lying flat Eyes: anicteric ENT: MMM Neck: supple, thick CV:  RRR, 2+ radial pulses Abd:  soft, obese, nontender Lungs: clear ant, normal WOB lying GU: foley with yellow  urine Extr: no edema Neuro: Aox3, much better concentration and fluent speech, no myoclonic jerking Skin: no rashes noted  Additional Objective Labs: Basic Metabolic Panel: Recent Labs  Lab 12/26/20 2145 12/27/20 0310 12/28/20 0400  NA 132* 134* 134*  K 4.7 4.3 4.6  CL 107 107 107  CO2 17* 20* 21*  GLUCOSE 322* 267* 287*  BUN 51* 48* 29*  CREATININE 4.29* 2.93* 1.22*  CALCIUM 7.2* 7.3* 8.3*  PHOS  --  4.2 2.6    Liver Function Tests: No results for input(s): AST, ALT, ALKPHOS, BILITOT, PROT, ALBUMIN in the last 168 hours. No results for input(s): LIPASE, AMYLASE in the last 168 hours. CBC: Recent Labs  Lab 12/26/20 0102 12/26/20 1956 12/26/20 2145 12/27/20 0310  WBC 11.7*  --  11.4* 8.9  NEUTROABS 6.7  --   --   --   HGB 12.5 11.6* 12.2 11.7*  HCT 39.6 34.0* 37.8 35.6*  MCV 97.8  --  96.4 95.2  PLT 188  --  182 173    Blood Culture    Component Value Date/Time   SDES URINE, CATHETERIZED 12/26/2020 0939   SPECREQUEST NONE 12/26/2020 0939   CULT  12/26/2020 0939    NO GROWTH Performed at [facility], [place] [address], [place], [place] [address]    REPTSTATUS 12/27/2020 FINAL 12/26/2020 0254    Cardiac Enzymes: Recent Labs  Lab  12/27/20 0310  CKTOTAL 1,158*   CBG: Recent Labs  Lab 12/27/20 1522 12/27/20 2006 12/27/20 2330 12/28/20 0350 12/28/20 0804  GLUCAP 256* 323* 301* 279* 274*    Iron Studies: No results for input(s): IRON, TIBC, TRANSFERRIN, FERRITIN in the last 72 hours. @lablastinr3 @ Studies/Results: CT Renal Stone Study  Result Date: 12/26/2020 CLINICAL DATA:  50 year old female with history of flank pain. Suspected kidney stone. EXAM: CT ABDOMEN AND PELVIS WITHOUT CONTRAST TECHNIQUE: Multidetector CT imaging of the abdomen and pelvis was performed following the standard protocol without IV contrast. COMPARISON:  CT the abdomen and pelvis 03/26/2020. FINDINGS: Lower chest: Areas of mild scarring are noted in the lung bases  bilaterally. Hepatobiliary: Diffuse low attenuation throughout the hepatic parenchyma, indicative of severe hepatic steatosis. No discrete cystic or solid hepatic lesions are confidently identified on today's noncontrast CT examination. Status post cholecystectomy. Pancreas: No definite pancreatic mass or peripancreatic fluid collections or inflammatory changes are noted on today's noncontrast CT examination. Spleen: Unremarkable. Adrenals/Urinary Tract: There are no abnormal calcifications within the collecting system of either kidney, along the course of either ureter, or within the lumen of the urinary bladder. No hydroureteronephrosis or perinephric stranding to suggest urinary tract obstruction at this time. The unenhanced appearance of the kidneys is unremarkable bilaterally. Urinary bladder is completely decompressed around an indwelling Foley balloon catheter, but is otherwise normal in appearance. Bilateral adrenal glands are normal in appearance. Stomach/Bowel: Unenhanced appearance of the stomach is normal. No pathologic dilatation of small bowel or colon. Normal appendix. Vascular/Lymphatic: No atherosclerotic calcifications in the abdominal aorta or pelvic vasculature. No lymphadenopathy noted in the abdomen or pelvis. Reproductive: IUD present in the uterus. Uterus and ovaries are otherwise unremarkable in appearance. Other: No significant volume of ascites.  No pneumoperitoneum. Musculoskeletal: Mixed attenuation partially calcified mass in the medial aspect of the left gluteal region, similar to prior studies, measuring up to 7.4 x 4.5 cm on today's examination, compatible with a benign lesions such as fat necrosis or dystrophic posttraumatic calcifications. There are no aggressive appearing lytic or blastic lesions noted in the visualized portions of the skeleton. IMPRESSION: 1. No acute findings are noted in the abdomen or pelvis to account for the patient's symptoms. Specifically, no urinary tract  calculus and no findings of urinary tract obstruction. 2. Severe hepatic steatosis. 3. Additional incidental findings, as above. Electronically Signed   By: Vinnie Langton M.D.   On: 12/26/2020 12:03   Medications:  sodium chloride 10 mL/hr at 12/28/20 0700   sodium chloride     lactated ringers 75 mL/hr at 12/28/20 1014   norepinephrine (LEVOPHED) Adult infusion 2 mcg/min (12/28/20 0800)    amitriptyline  25 mg Oral QHS   Chlorhexidine Gluconate Cloth  6 each Topical Daily   DULoxetine  60 mg Oral BID   fentaNYL  1 patch Transdermal Q72H   gabapentin  300 mg Oral Q8H   heparin  5,000 Units Subcutaneous Q8H   insulin aspart  0-20 Units Subcutaneous TID WC   insulin aspart  0-5 Units Subcutaneous QHS   insulin detemir  32 Units Subcutaneous BID   levETIRAcetam  750 mg Oral BID   levothyroxine  150 mcg Oral Q0600   lidocaine  1 patch Transdermal Q24H   mouth rinse  15 mL Mouth Rinse BID   senna  1 tablet Oral Daily     Jannifer Hick MD 12/28/2020, 11:30 AM  [facility] Pager: [phone]

## 2020-12-29 LAB — BASIC METABOLIC PANEL
Anion gap: 6 (ref 5–15)
BUN: 21 mg/dL — ABNORMAL HIGH (ref 6–20)
CO2: 26 mmol/L (ref 22–32)
Calcium: 8.9 mg/dL (ref 8.9–10.3)
Chloride: 106 mmol/L (ref 98–111)
Creatinine, Ser: 0.98 mg/dL (ref 0.44–1.00)
GFR, Estimated: >60 mL/min (ref >60)
Glucose, Bld: 244 mg/dL — ABNORMAL HIGH (ref 70–99)
Potassium: 4.9 mmol/L (ref 3.5–5.1)
Sodium: 138 mmol/L (ref 135–145)

## 2020-12-29 LAB — PHOSPHORUS: Phosphorus: 2.5 mg/dL (ref 2.5–4.6)

## 2020-12-29 LAB — GLUCOSE, CAPILLARY
Glucose-Capillary: 238 mg/dL — ABNORMAL HIGH (ref 70–99)
Glucose-Capillary: 269 mg/dL — ABNORMAL HIGH (ref 70–99)
Glucose-Capillary: 294 mg/dL — ABNORMAL HIGH (ref 70–99)
Glucose-Capillary: 335 mg/dL — ABNORMAL HIGH (ref 70–99)

## 2020-12-29 LAB — MAGNESIUM: Magnesium: 1.6 mg/dL — ABNORMAL LOW (ref 1.7–2.4)

## 2020-12-29 MED ORDER — INSULIN DETEMIR 100 UNIT/ML ~~LOC~~ SOLN
40.0000 [IU] | Freq: Two times a day (BID) | SUBCUTANEOUS | Status: DC
  Filled 2020-12-29 (×2): qty 0.4

## 2020-12-29 MED ORDER — INSULIN DETEMIR 100 UNIT/ML ~~LOC~~ SOLN
45.0000 [IU] | Freq: Two times a day (BID) | SUBCUTANEOUS | Status: DC
  Administered 2020-12-29 (×2): 45 [IU] via SUBCUTANEOUS
  Filled 2020-12-29 (×5): qty 0.45

## 2020-12-29 MED ORDER — MAGNESIUM SULFATE 4 GM/100ML IV SOLN
4.0000 g | Freq: Once | INTRAVENOUS | Status: AC
  Administered 2020-12-29: 4 g via INTRAVENOUS
  Filled 2020-12-29: qty 100

## 2020-12-29 MED ORDER — INSULIN ASPART 100 UNIT/ML IJ SOLN
6.0000 [IU] | Freq: Three times a day (TID) | INTRAMUSCULAR | Status: DC
  Administered 2020-12-29 (×2): 6 [IU] via SUBCUTANEOUS

## 2020-12-29 MED ORDER — OXYCODONE HCL 5 MG PO TABS
10.0000 mg | ORAL_TABLET | Freq: Four times a day (QID) | ORAL | Status: DC | PRN
  Administered 2020-12-29 – 2020-12-30 (×4): 10 mg via ORAL
  Filled 2020-12-29 (×4): qty 2

## 2020-12-29 NOTE — TOC Initial Note (Signed)
Transition of Care [facility]) - Initial/Assessment Note    Patient Details  Name: [name] MRN: [id number] Date of Birth: 07/17/1970  Transition of Care [facility] CM/SW Contact:    Tom-Johnson, Renea Ee, RN Phone Number: 12/29/2020, 6:34 PM  Clinical Narrative:                 CM spoke with patient at bedside. Has a PMH of CKD, Diabetes mellitus, GERD, Anemia, Depression, Chronic Pain Syndrome, Hypertension, Hypothyroidism, OSA/OHS. Presented to the ED with decreased urine output over the past 2-3 days and noticed myoclonic jerks which she has had with prior kidney injuries. States she lives alone and has no children. Parents are deceased and has no siblings. Has a boyfriend who is involved in her care. Independent with care and drives self prior to hospitalization. Ambulates with a cane. Denies any needs at this time. No recommendations from PT. CM will continue to follow with needs.    Expected Discharge Plan: Home/Self Care Barriers to Discharge: Continued Medical Work up   Patient Goals and CMS Choice Patient states their goals for this hospitalization and ongoing recovery are:: To go home      Expected Discharge Plan and Services Expected Discharge Plan: Home/Self Care   Discharge Planning Services: CM Consult   Living arrangements for the past 2 months: Single Family Home                   DME Agency: NA       HH Arranged: NA [place] Agency: NA        Prior Living Arrangements/Services Living arrangements for the past 2 months: Single Family Home Lives with:: Self Patient language and need for interpreter reviewed:: Yes Do you feel safe going back to the place where you live?: Yes      Need for Family Participation in Patient Care: Yes (Comment) Care giver support system in place?: Yes (comment) Current home services: DME Kasandra Knudsen) Criminal Activity/Legal Involvement Pertinent to Current Situation/Hospitalization: No - Comment as needed  Activities of Daily  Living      Permission Sought/Granted Permission sought to share information with : Case Manager, Customer service manager Permission granted to share information with : Yes, Verbal Permission Granted              Emotional Assessment Appearance:: Appears stated age Attitude/Demeanor/Rapport: Engaged Affect (typically observed): Accepting, Appropriate, Calm, Hopeful Orientation: : Oriented to Self, Oriented to Place, Oriented to  Time, Oriented to Situation Alcohol / Substance Use: Not Applicable Psych Involvement: No (comment)  Admission diagnosis:  Hyperglycemia [R73.9] Acute renal failure (ARF) (HCC) [N17.9] AKI (acute kidney injury) ([place]) [N17.9] Acute renal failure, unspecified acute renal failure type ([place]) [N17.9] Patient Active Problem List   Diagnosis Date Noted   Shock ([place])    Acute renal failure (ARF) ([place]) 62/70/3500   Acute metabolic encephalopathy 93/81/8299   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   AKI (acute kidney injury) ([place]) 03/07/2020   Seizures ([place]) 03/07/2020   Acute lower UTI 03/07/2020   Tremor 03/07/2020   History of 2019 novel coronavirus disease (COVID-19) 03/25/2019   Depression with anxiety 03/25/2019   Chest pain 03/24/2019   Postoperative abscess 37/16/9678   Umbilical hernia, incarcerated, s/p repair 10/16/2017 10/16/2017   Chronic pain of both knees 04/23/2017   Unilateral primary osteoarthritis, left knee 10/24/2016   Unilateral primary osteoarthritis, right knee 10/24/2016   Abnormal mammogram with microcalcification-Right inner lower quadrant 07/21/2013   Oliguria and anuria 09/02/2012   Constipation  03/02/2011   Lethargy 03/01/2011   Heel ulcer due to DM ([place]) 02/19/2011   Wound, open, hip or thigh 02/19/2011   Healing pressure ulcer stage III ([place]) 02/18/2011   Morbidly obese ([place]) 02/18/2011   Diabetes mellitus type 2 with complications, uncontrolled ([place]) 02/18/2011   Sleep apnea 02/18/2011   Hypothyroidism 02/18/2011    Chronic pain 02/18/2011   Acute lymphangitis 02/18/2011   Cellulitis of foot 02/18/2011   PCP:  Pecolia Ades, NP Pharmacy:   [facility], [address] [place] [address] Phone: [phone] Fax: [phone]     Social Determinants of Health (SDOH) Interventions    Readmission Risk Interventions No flowsheet data found.

## 2020-12-29 NOTE — Progress Notes (Signed)
PROGRESS NOTE    [name]  TGG:[id number] DOB: 1970/03/26 DOA: 12/26/2020 PCP: Pecolia Ades, NP   Brief Narrative: 50 year old with past medical history significant for  fibromyalgia, GERD, hypertension, depression who presented to hospital with decreased urine output over past 2 or 3 days and noticed myoclonic jerk which she has had with prior kidney injuries.  Her creatinine on admission was 6.3 baseline of 0.8.  CT renal stone protocol was negative for hydronephrosis.  Patient received fentanyl for pain and developed altered mental status and hypotension.  Her mentation improved with Narcan.  CCM admitted patient for hypovolemic shock, patient received 5 L of IV fluids in the ED.  She was a started on low-dose Levophed.  Nephrology  was consulted for her acute on chronic renal failure.  Renal Failure thought to be related to  oversedation from medications leading to hypovolemia and AKI.   Assessment & Plan:   Active Problems:   AKI (acute kidney injury) ([place])   Acute renal failure (ARF) ([place])   Shock ([place])  1-AKI ;  Patient presented with a creatinine of 6.3 Second admission for AKI, similar presentation attributed to volume depletion in the setting of polypharmacy. Renal function improved with IV fluids and blood pressure support. Patient was counseled in regards her outpatient pain regimen.  She will need to continue to taper down these medications. Per nephrology  patient has AKI no CKD. Creatinine has decreased to 1.2  Hypovolemic shock: Thought to be related to dehydration. Echo: Ejection fraction 60% Patient was weaned off of Levophed. He received IV fluids.  Diabetes mellitus, uncontrolled hyperglycemia Increase Levemir to 45 units twice daily.  Will add 6 units with meal coverage.  Continue with a sliding scale insulin Hypomagnesemia: Replete IV Metabolic acidosis: Secondary to AKI: Improved Hyponatremia: Secondary to hypovolemia improved. Chronic back  pain: Change oxycodone to every 6 hours as needed for pain.  Continue with fentanyl patch.  Hypothyroidism: Continue with Synthroid History of seizure: Continue with Keppra Morbid obesity: Need lifestyle modification Estimated body mass index is 59.86 kg/m as calculated from the following:   Height as of this encounter: 5' 5.5" (1.664 m).   Weight as of this encounter: 165.7 kg.   DVT prophylaxis: Heparin Code Status: Full code Family Communication: Care discussed with patient Disposition Plan:  Status is: Inpatient  Remains inpatient appropriate because:IV treatments appropriate due to intensity of illness or inability to take PO  Dispo: The patient is from: Home              Anticipated d/c is to: Home              Patient currently is not medically stable to d/c.  PT OT eval   Difficult to place patient No        Consultants:  CCM admitted patient Nephrology signed off  Procedures:  Echo:Left Ventricle: Left ventricular ejection fraction, by estimation, is 65  to 70%. The left ventricle has normal function. The left ventricle has no  regional wall motion abnormalities. Definity contrast agent was given IV  to delineate the left ventricular   endocardial borders. The left ventricular internal cavity size was normal  in size. There is no left ventricular hypertrophy. Left ventricular  diastolic function could not be evaluated.   Antimicrobials:  None  Subjective: She is feeling okay, we discussed about trying to decrease her pain regimen.  She said that in the past she has been on higher dose and she  was able to decrease the dose.  Objective: Vitals:   12/29/20 0300 12/29/20 0500 12/29/20 0625 12/29/20 0803  BP: (!) 109/54  130/64   Pulse: 81     Resp: 13     Temp:    98.1 F (36.7 C)  TempSrc:    Oral  SpO2: 97%     Weight:  (!) 165.7 kg    Height:        Intake/Output Summary (Last 24 hours) at 12/29/2020 0945 Last data filed at 12/29/2020 0801 Gross  per 24 hour  Intake 1422.5 ml  Output 820 ml  Net 602.5 ml   Filed Weights   12/27/20 0406 12/28/20 0500 12/29/20 0500  Weight: (!) 166.6 kg (!) 165.4 kg (!) 165.7 kg    Examination:  General exam: Appears calm and comfortable  Respiratory system: Clear to auscultation. Respiratory effort normal. Cardiovascular system: S1 & S2 heard, RRR. No JVD, murmurs, rubs, gallops or clicks. No pedal edema. Gastrointestinal system: Abdomen is nondistended, soft and nontender. No organomegaly or masses felt. Normal bowel sounds heard. Central nervous system: Alert and oriented. Extremities: Symmetric 5 x 5 power.    Data Reviewed: I have personally reviewed following labs and imaging studies  CBC: Recent Labs  Lab 12/26/20 0102 12/26/20 1956 12/26/20 2145 12/27/20 0310  WBC 11.7*  --  11.4* 8.9  NEUTROABS 6.7  --   --   --   HGB 12.5 11.6* 12.2 11.7*  HCT 39.6 34.0* 37.8 35.6*  MCV 97.8  --  96.4 95.2  PLT 188  --  182 408   Basic Metabolic Panel: Recent Labs  Lab 12/26/20 1619 12/26/20 1956 12/26/20 2145 12/27/20 0310 12/28/20 0400 12/29/20 0232  NA 131* 134* 132* 134* 134* 138  K 4.3 4.9 4.7 4.3 4.6 4.9  CL 103  --  107 107 107 106  CO2 17*  --  17* 20* 21* 26  GLUCOSE 330*  --  322* 267* 287* 244*  BUN 58*  --  51* 48* 29* 21*  CREATININE 5.74*  --  4.29* 2.93* 1.22* 0.98  CALCIUM 7.0*  --  7.2* 7.3* 8.3* 8.9  MG  --   --   --  1.6* 2.0 1.6*  PHOS  --   --   --  4.2 2.6 2.5   GFR: Estimated Creatinine Clearance: 107.3 mL/min (by C-G formula based on SCr of 0.98 mg/dL). Liver Function Tests: No results for input(s): AST, ALT, ALKPHOS, BILITOT, PROT, ALBUMIN in the last 168 hours. No results for input(s): LIPASE, AMYLASE in the last 168 hours. No results for input(s): AMMONIA in the last 168 hours. Coagulation Profile: No results for input(s): INR, PROTIME in the last 168 hours. Cardiac Enzymes: Recent Labs  Lab 12/27/20 0310 12/28/20 1110  CKTOTAL 1,158*  827*   BNP (last 3 results) No results for input(s): PROBNP in the last 8760 hours. HbA1C: Recent Labs    12/26/20 1808  HGBA1C 10.3*   CBG: Recent Labs  Lab 12/28/20 0804 12/28/20 1214 12/28/20 1717 12/28/20 2310 12/29/20 0802  GLUCAP 274* 302* 294* 286* 238*   Lipid Profile: No results for input(s): CHOL, HDL, LDLCALC, TRIG, CHOLHDL, LDLDIRECT in the last 72 hours. Thyroid Function Tests: No results for input(s): TSH, T4TOTAL, FREET4, T3FREE, THYROIDAB in the last 72 hours. Anemia Panel: No results for input(s): VITAMINB12, FOLATE, FERRITIN, TIBC, IRON, RETICCTPCT in the last 72 hours. Sepsis Labs: Recent Labs  Lab 12/26/20 2117 12/28/20 1110  PROCALCITON  --  0.10  LATICACIDVEN 1.0 1.0    Recent Results (from the past 240 hour(s))  Resp Panel by RT-PCR (Flu A&B, Covid) Nasopharyngeal Swab     Status: None   Collection Time: 12/26/20 12:55 AM   Specimen: Nasopharyngeal Swab; Nasopharyngeal(NP) swabs in vial transport medium  Result Value Ref Range Status   SARS Coronavirus 2 by RT PCR NEGATIVE NEGATIVE Final    Comment: (NOTE) SARS-CoV-2 target nucleic acids are NOT DETECTED.  The SARS-CoV-2 RNA is generally detectable in upper respiratory specimens during the acute phase of infection. The lowest concentration of SARS-CoV-2 viral copies this assay can detect is 138 copies/mL. A negative result does not preclude SARS-Cov-2 infection and should not be used as the sole basis for treatment or other patient management decisions. A negative result may occur with  improper specimen collection/handling, submission of specimen other than nasopharyngeal swab, presence of viral mutation(s) within the areas targeted by this assay, and inadequate number of viral copies(<138 copies/mL). A negative result must be combined with clinical observations, patient history, and epidemiological information. The expected result is Negative.  Fact Sheet for Patients:   EntrepreneurPulse.com.au  Fact Sheet for Healthcare Providers:  IncredibleEmployment.be  This test is no t yet approved or cleared by the [place] FDA and  has been authorized for detection and/or diagnosis of SARS-CoV-2 by FDA under an Emergency Use Authorization (EUA). This EUA will remain  in effect (meaning this test can be used) for the duration of the COVID-19 declaration under Section 564(b)(1) of the Act, 21 U.S.C.section 360bbb-3(b)(1), unless the authorization is terminated  or revoked sooner.       Influenza A by PCR NEGATIVE NEGATIVE Final   Influenza B by PCR NEGATIVE NEGATIVE Final    Comment: (NOTE) The Xpert Xpress SARS-CoV-2/FLU/RSV plus assay is intended as an aid in the diagnosis of influenza from Nasopharyngeal swab specimens and should not be used as a sole basis for treatment. Nasal washings and aspirates are unacceptable for Xpert Xpress SARS-CoV-2/FLU/RSV testing.  Fact Sheet for Patients: EntrepreneurPulse.com.au  Fact Sheet for Healthcare Providers: IncredibleEmployment.be  This test is not yet approved or cleared by the [place] FDA and has been authorized for detection and/or diagnosis of SARS-CoV-2 by FDA under an Emergency Use Authorization (EUA). This EUA will remain in effect (meaning this test can be used) for the duration of the COVID-19 declaration under Section 564(b)(1) of the Act, 21 U.S.C. section 360bbb-3(b)(1), unless the authorization is terminated or revoked.  Performed at [facility], [place] [address] [place], [place] [address]   Urine Culture     Status: None   Collection Time: 12/26/20  9:39 AM   Specimen: Urine, Catheterized  Result Value Ref Range Status   Specimen Description URINE, CATHETERIZED  Final   Special Requests NONE  Final   Culture   Final    NO GROWTH Performed at [facility] [address], [place], [place]  [address]    Report Status 12/27/2020 FINAL  Final  MRSA Next Gen by PCR, Nasal     Status: None   Collection Time: 12/26/20  9:15 PM   Specimen: Nasal Mucosa; Nasal Swab  Result Value Ref Range Status   MRSA by PCR Next Gen NOT DETECTED NOT DETECTED Final    Comment: (NOTE) The GeneXpert MRSA Assay (FDA approved for NASAL specimens only), is one component of a comprehensive MRSA colonization surveillance program. It is not intended to diagnose MRSA infection nor to guide or  monitor treatment for MRSA infections. Test performance is not FDA approved in patients less than 68 years old. Performed at [facility], [place] [address] [place], [place] [address]          Radiology Studies: ECHOCARDIOGRAM COMPLETE  Result Date: 12/28/2020    ECHOCARDIOGRAM REPORT   Patient Name:   [name] Date of Exam: 12/28/2020 Medical Rec #:  [id number]         Height:       65.5 in Accession #:    [id number]        Weight:       364.6 lb Date of Birth:  January 08, 1971         BSA:          2.568 m Patient Age:    57 years          BP:           118/97 mmHg Patient Gender: F                 HR:           81 bpm. Exam Location:  Inpatient Procedure: 2D Echo, Cardiac Doppler, Color Doppler and Intracardiac            Opacification Agent Indications:    Hypotension  History:        Patient has no prior history of Echocardiogram examinations.                 Signs/Symptoms:Hypotension and ARF; Risk Factors:Hypertension,                 Diabetes, Sleep Apnea and Morbid obesity.  Sonographer:    Dustin Flock RDCS Referring Phys: [id number] Candee Furbish  Sonographer Comments: Technically difficult study due to poor echo windows and patient is morbidly obese. IMPRESSIONS  1. Technically difficult echo with poor image quality.  2. Left ventricular ejection fraction, by estimation, is 65 to 70%. The left ventricle has normal function. The left ventricle has no regional wall motion abnormalities. Left ventricular  diastolic function could not be evaluated.  3. Right ventricular systolic function was not well visualized. The right ventricular size is not well visualized.  4. The mitral valve is grossly normal. No evidence of mitral valve regurgitation.  5. The aortic valve is grossly normal. Aortic valve regurgitation is not visualized. FINDINGS  Left Ventricle: Left ventricular ejection fraction, by estimation, is 65 to 70%. The left ventricle has normal function. The left ventricle has no regional wall motion abnormalities. Definity contrast agent was given IV to delineate the left ventricular  endocardial borders. The left ventricular internal cavity size was normal in size. There is no left ventricular hypertrophy. Left ventricular diastolic function could not be evaluated. Right Ventricle: The right ventricular size is not well visualized. Right vetricular wall thickness was not well visualized. Right ventricular systolic function was not well visualized. Left Atrium: Left atrial size was not well visualized. Right Atrium: Right atrial size was not well visualized. Pericardium: There is no evidence of pericardial effusion. Mitral Valve: The mitral valve is grossly normal. No evidence of mitral valve regurgitation. Tricuspid Valve: The tricuspid valve is normal in structure. Tricuspid valve regurgitation is trivial. Aortic Valve: The aortic valve is grossly normal. Aortic valve regurgitation is not visualized. Pulmonic Valve: The pulmonic valve was normal in structure. Pulmonic valve regurgitation is not visualized. Aorta: The aortic root and ascending aorta are structurally normal, with no evidence of dilitation.  IAS/Shunts: The interatrial septum was not well visualized.  LEFT VENTRICLE PLAX 2D LVIDd:         4.50 cm LVIDs:         2.70 cm LV PW:         1.30 cm LV IVS:        1.20 cm LVOT diam:     2.10 cm LV SV:         47 LV SV Index:   18 LVOT Area:     3.46 cm  LEFT ATRIUM         Index LA diam:    3.70 cm 1.44  cm/m  AORTIC VALVE LVOT Vmax:   77.50 cm/s LVOT Vmean:  53.800 cm/s LVOT VTI:    0.136 m  AORTA Ao Root diam: 2.50 cm MITRAL VALVE MV Area (PHT): 4.74 cm    SHUNTS MV Decel Time: 160 msec    Systemic VTI:  0.14 m MV E velocity: 77.60 cm/s  Systemic Diam: 2.10 cm MV A velocity: 60.80 cm/s MV E/A ratio:  1.28 Mertie Moores MD Electronically signed by Mertie Moores MD Signature Date/Time: 12/28/2020/12:30:40 PM    Final         Scheduled Meds:  amitriptyline  25 mg Oral QHS   Chlorhexidine Gluconate Cloth  6 each Topical Daily   DULoxetine  60 mg Oral BID   fentaNYL  1 patch Transdermal Q72H   gabapentin  300 mg Oral Q8H   heparin  5,000 Units Subcutaneous Q8H   insulin aspart  0-20 Units Subcutaneous TID WC   insulin aspart  0-5 Units Subcutaneous QHS   insulin aspart  6 Units Subcutaneous TID WC   insulin detemir  45 Units Subcutaneous BID   levETIRAcetam  750 mg Oral BID   levothyroxine  150 mcg Oral Q0600   lidocaine  1 patch Transdermal Q24H   mouth rinse  15 mL Mouth Rinse BID   senna  1 tablet Oral Daily   Continuous Infusions:  sodium chloride Stopped (12/28/20 1505)   sodium chloride       LOS: 3 days    Time spent: 35 minutes    Marquin Patino A Jaelynn Currier, MD [facility]   If 7PM-7AM, please contact night-coverage [email or web address]  12/29/2020, 9:45 AM

## 2020-12-29 NOTE — Progress Notes (Deleted)
NAME:  [name], MRN:  [id number], DOB:  December 20, 1970, LOS: 3 ADMISSION DATE:  12/26/2020, CONSULTATION DATE:  12/26/20 REFERRING MD: Maudie Mercury, MD  CHIEF COMPLAINT:  hypotension  History of Present Illness:  [name] is a 50 year old woman with CKD, fibromyalgia, GERD, hypertension and depression who presented to the hospital with decreased urine output over the past 2-3 days and noticed myoclonic jerks which she has had with prior kidney injury's. Her Cr is 6.35 today with a baseline around 0.8. CT Renal stone protocol is negative for hydronephrosis. Patient received fentanyl for pain and developed altered mentation with hypotension. Her mentation improved with narcan. PCCM has been consulted for the hypotension.  Patient's brother is at the bedside. Patient is somnolent but arousable to verbal stimuli. She denied being in any pain or having nausea, vomiting, dizzniness of feeling light headed.   She has received 5L of IV fluids in the ER. Nephrology has seen the patient and started her on LR at 121mL/hr. She has made around 359mL of urine since foley  placement.   Pertinent  Medical History  Chronic Kidney Disease Diabetes mellitus Depression Chronic Pain Syndrome GERD Anemia Hypertension Hypothyroidism OSA/OHS  Significant Hospital Events: Including procedures, antibiotic start and stop dates in addition to other pertinent events   10/4 admitted for Acute renal failure and hypotension 10/5 Arterial line replaced   Interim History / Subjective:  No acute events overnight.  No complaints this morning.  Objective   Blood pressure 130/64, pulse 81, temperature 97.8 F (36.6 C), resp. rate 13, height 5' 5.5" (1.664 m), weight (!) 165.7 kg, SpO2 97 %.        Intake/Output Summary (Last 24 hours) at 12/29/2020 1572 Last data filed at 12/29/2020 0400 Gross per 24 hour  Intake 1632.05 ml  Output 1120 ml  Net 512.05 ml    Filed Weights   12/27/20 0406 12/28/20  0500 12/29/20 0500  Weight: (!) 166.6 kg (!) 165.4 kg (!) 165.7 kg    Examination: General: obese woman, awake and alert, no acute distress HENT: [place]/AT, sclera anicteric Lungs: Clear to auscultation bilaterally, no wheezing. Cardiovascular: regular rate and rhythm, S1-S2, no murmurs Abdomen: Soft, nontender, nondistended, bowel sounds present Extremities: No edema, warm Neuro: alert and oriented x3 GU: Foley catheter in place  Resolved Hospital Problem list   Hyponatremia Acute encephalopathy  Non-anion gap metabolic acidosis AKI Shock  Assessment & Plan:  AKI, resolved Likely prerenal etiology due to hypovolemia.  Renal function significantly improved with volume expansion, creatinine of 0.98 this morning, was above 6 on admission.  She has had 2 prior episodes of AKI attributed to volume depletion in the setting of polypharmacy.  Patient will need a thorough medication reconciliation. -Renally dosed medications, avoid nephrotoxic agent -Mag repleted overnight  Shock, resolved Likely in setting of hypovolemia possibly related to hyperglycemia and vasaplegia secondary to narcotic medication.  Pressures have improved and she has been weaned off of Levophed.  Of note cuff pressures are compatible with A-line pressures, can remove arterial line today.  Lactic acid and procalcitonin unremarkable.  Echocardiogram with normal EF and no regional wall abnormalities.  Can transfer out of the ICU today.  Diabetes mellitus Hyperglycemia Blood sugars remain in the upper 200-300 range. Home medications include 70/30 75 units BID.  Uptitrate insulin regimen. Consider oral diabetic medications for better glycemic control at discharge. -SSI  -Levemir increased to 40 units twice daily  Chronic back pain -Continue home meds fentanyl patch  and oxycodone 10 mg qid PRN -Resumed home gabapentin and amitriptyline as renal function improved to prevent withdrawal; however may need to wean   -Holding  Soma -Continue Cymbalta -Will need a thorough medication reconciliation to prevent recurrence of AKI in the setting of hypovolemia and polypharmacy  Hypothyroidism - continue synthroid 133mcg daily  Best Practice (right click and "Reselect all SmartList Selections" daily)   Diet/type: Regular consistency (see orders) DVT prophylaxis: prophylactic heparin  GI prophylaxis: N/A Lines: Arterial Line Foley:  removal ordered  Code Status:  full code Last date of multidisciplinary goals of care discussion [10/7]  Labs   CBC: Recent Labs  Lab 12/26/20 0102 12/26/20 1956 12/26/20 2145 12/27/20 0310  WBC 11.7*  --  11.4* 8.9  NEUTROABS 6.7  --   --   --   HGB 12.5 11.6* 12.2 11.7*  HCT 39.6 34.0* 37.8 35.6*  MCV 97.8  --  96.4 95.2  PLT 188  --  182 173     Basic Metabolic Panel: Recent Labs  Lab 12/26/20 1619 12/26/20 1956 12/26/20 2145 12/27/20 0310 12/28/20 0400 12/29/20 0232  NA 131* 134* 132* 134* 134* 138  K 4.3 4.9 4.7 4.3 4.6 4.9  CL 103  --  107 107 107 106  CO2 17*  --  17* 20* 21* 26  GLUCOSE 330*  --  322* 267* 287* 244*  BUN 58*  --  51* 48* 29* 21*  CREATININE 5.74*  --  4.29* 2.93* 1.22* 0.98  CALCIUM 7.0*  --  7.2* 7.3* 8.3* 8.9  MG  --   --   --  1.6* 2.0 1.6*  PHOS  --   --   --  4.2 2.6 2.5    GFR: Estimated Creatinine Clearance: 107.3 mL/min (by C-G formula based on SCr of 0.98 mg/dL). Recent Labs  Lab 12/26/20 0102 12/26/20 2117 12/26/20 2145 12/27/20 0310 12/28/20 1110  PROCALCITON  --   --   --   --  0.10  WBC 11.7*  --  11.4* 8.9  --   LATICACIDVEN  --  1.0  --   --  1.0     Liver Function Tests: No results for input(s): AST, ALT, ALKPHOS, BILITOT, PROT, ALBUMIN in the last 168 hours. No results for input(s): LIPASE, AMYLASE in the last 168 hours. No results for input(s): AMMONIA in the last 168 hours.  ABG    Component Value Date/Time   PHART 7.190 (LL) 12/26/2020 1956   PCO2ART 47.3 12/26/2020 1956   PO2ART 97 12/26/2020  1956   HCO3 18.0 (L) 12/26/2020 1956   TCO2 19 (L) 12/26/2020 1956   ACIDBASEDEF 10.0 (H) 12/26/2020 1956   O2SAT 95.0 12/26/2020 1956      Coagulation Profile: No results for input(s): INR, PROTIME in the last 168 hours.  Cardiac Enzymes: Recent Labs  Lab 12/27/20 0310 12/28/20 1110  CKTOTAL 1,158* 827*     HbA1C: Hgb A1c MFr Bld  Date/Time Value Ref Range Status  12/26/2020 06:08 PM 10.3 (H) 4.8 - 5.6 % Final    Comment:    (NOTE) Pre diabetes:          5.7%-6.4%  Diabetes:              >6.4%  Glycemic control for   <7.0% adults with diabetes   03/08/2020 03:03 AM 11.9 (H) 4.8 - 5.6 % Final    Comment:    (NOTE) Pre diabetes:  5.7%-6.4%  Diabetes:              >6.4%  Glycemic control for   <7.0% adults with diabetes     CBG: Recent Labs  Lab 12/28/20 0350 12/28/20 0804 12/28/20 1214 12/28/20 1717 12/28/20 2310  GLUCAP 279* 274* 302* 294* 286*     Review of Systems:   Review of Systems  Respiratory:  Negative for shortness of breath.   Cardiovascular:  Negative for chest pain and leg swelling.  Gastrointestinal:  Negative for abdominal pain, nausea and vomiting.  Musculoskeletal:  Positive for back pain and joint pain.  Neurological:  Negative for dizziness and headaches.   Harlow Ohms, DO PGY-3

## 2020-12-29 NOTE — Progress Notes (Signed)
[facility] ADULT ICU REPLACEMENT PROTOCOL   The patient does apply for the [facility] Adult ICU Electrolyte Replacment Protocol based on the criteria listed below:   1.Exclusion criteria: TCTS patients, ECMO patients and Hypothermia Protocol, and   Dialysis patients 2. Is GFR >/= 30 ml/min? Yes.    Patient's GFR today is >60 3. Is SCr </= 2? No. Patient's SCr is 0.98 mg/dL 4. Did SCr increase >/= 0.5 in 24 hours? No. 5.Pt's weight >40kg  Yes.   6. Abnormal electrolyte(s): mag 1.6  7. Electrolytes replaced per protocol 8.  Call MD STAT for K+ </= 2.5, Phos </= 1, or Mag </= 1 Physician:  n/a  Darlys Gales 12/29/2020 5:22 AM
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
## 2020-12-30 LAB — BASIC METABOLIC PANEL
Anion gap: 5 (ref 5–15)
BUN: 16 mg/dL (ref 6–20)
CO2: 27 mmol/L (ref 22–32)
Calcium: 8.9 mg/dL (ref 8.9–10.3)
Chloride: 104 mmol/L (ref 98–111)
Creatinine, Ser: 0.99 mg/dL (ref 0.44–1.00)
GFR, Estimated: >60 mL/min (ref >60)
Glucose, Bld: 364 mg/dL — ABNORMAL HIGH (ref 70–99)
Potassium: 5 mmol/L (ref 3.5–5.1)
Sodium: 136 mmol/L (ref 135–145)

## 2020-12-30 LAB — GLUCOSE, CAPILLARY: Glucose-Capillary: 253 mg/dL — ABNORMAL HIGH (ref 70–99)

## 2020-12-30 LAB — IRON AND TIBC
Iron: 58 ug/dL (ref 28–170)
Saturation Ratios: 19 % (ref 10.4–31.8)
TIBC: 305 ug/dL (ref 250–450)
UIBC: 247 ug/dL

## 2020-12-30 LAB — PHOSPHORUS: Phosphorus: 2.6 mg/dL (ref 2.5–4.6)

## 2020-12-30 LAB — FERRITIN: Ferritin: 50 ng/mL (ref 11–307)

## 2020-12-30 LAB — VITAMIN B12: Vitamin B-12: 672 pg/mL (ref 180–914)

## 2020-12-30 LAB — MAGNESIUM: Magnesium: 1.9 mg/dL (ref 1.7–2.4)

## 2020-12-30 MED ORDER — DULOXETINE HCL 60 MG PO CPEP: 60.0000 mg | ORAL_CAPSULE | Freq: Two times a day (BID) | ORAL | 3 refills | Status: DC

## 2020-12-30 MED ORDER — INSULIN ASPART 100 UNIT/ML IJ SOLN
10.0000 [IU] | Freq: Three times a day (TID) | INTRAMUSCULAR | Status: DC
  Administered 2020-12-30: 10 [IU] via SUBCUTANEOUS

## 2020-12-30 MED ORDER — INSULIN DETEMIR 100 UNIT/ML ~~LOC~~ SOLN
50.0000 [IU] | Freq: Two times a day (BID) | SUBCUTANEOUS | Status: DC
  Administered 2020-12-30: 50 [IU] via SUBCUTANEOUS
  Filled 2020-12-30 (×2): qty 0.5

## 2020-12-30 MED ORDER — OXYCODONE HCL 10 MG PO TABS: 10.0000 mg | ORAL_TABLET | Freq: Four times a day (QID) | ORAL | 0 refills | Status: AC | PRN

## 2020-12-30 NOTE — Progress Notes (Signed)
Patient transferred to room 6N12 from 3M12. Alert and oriented x4. Vital signs within normal limits. Denies c/o pain. Oriented to room and remote. Will continue to monitor.
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
## 2020-12-30 NOTE — Discharge Summary (Signed)
Physician Discharge Summary  [name] VHQ:[id number] DOB: 09-24-1970 DOA: 12/26/2020  PCP: Pecolia Ades, NP  Admit date: 12/26/2020 Discharge date: 12/30/2020  Admitted From: Home  Disposition: Home   Recommendations for Outpatient Follow-up:  Follow up with PCP in 1-2 weeks Please obtain BMP/CBC in one week Needs further taper down  of pain medication.  Needs Sleep Study  Repeat renal function.  Needs weight loss.   Home Health: None   Discharge Condition: Stable.  CODE STATUS: Full Code Diet recommendation: Heart Healthy   Brief/Interim Summary: 50 year old with past medical history significant for  fibromyalgia, GERD, hypertension, depression who presented to hospital with decreased urine output over past 2 or 3 days and noticed myoclonic jerk which she has had with prior kidney injuries.  Her creatinine on admission was 6.3 baseline of 0.8.  CT renal stone protocol was negative for hydronephrosis.  Patient received fentanyl for pain and developed altered mental status and hypotension.  Her mentation improved with Narcan.  CCM admitted patient for hypovolemic shock, patient received 5 L of IV fluids in the ED.  She was a started on low-dose Levophed.  Nephrology  was consulted for her acute on chronic renal failure.  Renal Failure thought to be related to  oversedation from medications leading to hypovolemia and AKI.    1-AKI ;  Patient presented with a creatinine of 6.3 Second admission for AKI, similar presentation attributed to volume depletion in the setting of polypharmacy. Renal function improved with IV fluids and blood pressure support. Patient was counseled in regards her outpatient pain regimen.  She will need to continue to taper down these medications. Per nephrology  patient has AKI no CKD. Creatinine has decreased to 1.2   Hypovolemic shock: Thought to be related to dehydration. Echo: Ejection fraction 60% Patient was weaned off of Levophed. She  received IV fluids.   Diabetes mellitus, uncontrolled hyperglycemia Resume home regimen at discharge.  Hypomagnesemia: Replaced.  Metabolic acidosis: Secondary to AKI: Improved Hyponatremia: Secondary to hypovolemia improved. Chronic back pain: Change oxycodone to every 6 hours as needed for pain.  Continue with fentanyl patch. Soma discontinue.   Hypothyroidism: Continue with Synthroid History of seizure: Continue with Keppra Morbid obesity: Need lifestyle modification Estimated body mass index is 59.86 kg/m as calculated from the following:   Height as of this encounter: 5' 5.5" (1.664 m).   Weight as of this encounter: 165.7 kg.      Discharge Diagnoses:  Active Problems:   AKI (acute kidney injury) ([place])   Acute renal failure (ARF) ([place])   Shock ([place])    Discharge Instructions  Discharge Instructions     Diet - low sodium heart healthy   Complete by: As directed    Increase activity slowly   Complete by: As directed       Allergies as of 12/30/2020       Reactions   Penicillins Hives, Other (See Comments)   HAS PT DEVELOPED SEVERE RASH INVOLVING MUCUS MEMBRANES or SKIN NECROSIS: #  #  YES  #  # PATIENT HAS HAD A PCN REACTION THAT REQUIRED HOSPITALIZATION:  #  #  YES  #  #  Tolerates amoxicillin on multiple occasions per Dr. Darrel Hoover note 11/01/17.  TDD.   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    Sulfa Antibiotics Hives   Versed [midazolam] Nausea And Vomiting   Pt had medication on  October 16 2017 with no issues        Medication List     STOP taking these medications    ARIPiprazole 5 MG tablet Commonly known as: ABILIFY   carisoprodol 350 MG tablet Commonly known as: SOMA   eletriptan 40 MG tablet Commonly known as: RELPAX   hydrOXYzine 25 MG capsule Commonly known as: VISTARIL   lisinopril 40 MG tablet Commonly known as: ZESTRIL   NexIUM 40 MG  capsule Generic drug: esomeprazole   nystatin powder Generic drug: nystatin   promethazine 25 MG tablet Commonly known as: PHENERGAN   topiramate 200 MG tablet Commonly known as: TOPAMAX       TAKE these medications    acetaminophen 325 MG tablet Commonly known as: TYLENOL Take 2 tablets (650 mg total) by mouth every 6 (six) hours as needed for mild pain (or Fever >/= 101). What changed: Another medication with the same name was removed. Continue taking this medication, and follow the directions you see here.   amitriptyline 25 MG tablet Commonly known as: ELAVIL Take 1 tablet (25 mg total) by mouth at bedtime.   atorvastatin 20 MG tablet Commonly known as: LIPITOR Take 20 mg by mouth daily.   Biotin 10000 MCG Tabs Take 10,000 mcg by mouth daily.   cetirizine 10 MG tablet Commonly known as: ZYRTEC Take 10 mg by mouth daily as needed for allergies.   Diclofenac Sodium 2 % Soln Place 2-4 g onto the skin 4 (four) times daily as needed. What changed:  how much to take reasons to take this   DULoxetine 60 MG capsule Commonly known as: CYMBALTA Take 1 capsule (60 mg total) by mouth 2 (two) times daily. What changed: how much to take   fentaNYL 50 MCG/HR Commonly known as: [place] 1 patch onto the skin every 3 (three) days.   gabapentin 600 MG tablet Commonly known as: NEURONTIN Take 0.5 tablets (300 mg total) by mouth 3 (three) times daily.   levETIRAcetam 750 MG tablet Commonly known as: KEPPRA Take 750 mg by mouth 2 (two) times daily.   levothyroxine 150 MCG tablet Commonly known as: SYNTHROID Take 150 mcg by mouth daily before breakfast.   lidocaine 5 % Commonly known as: LIDODERM Place 3 patches onto the skin See admin instructions. Place 3 patches daily to affected areas and replace every 24 hours   Narcan 4 MG/0.1ML Liqd nasal spray kit Generic drug: naloxone Place 4 mg into the nose daily as needed (opioid overdose).   NovoLOG Mix 70/30  FlexPen (70-30) 100 UNIT/ML FlexPen Generic drug: insulin aspart protamine - aspart Inject 75 Units into the skin in the morning, at noon, and at bedtime.   omega-3 acid ethyl esters 1 g capsule Commonly known as: LOVAZA Take 1 capsule by mouth 2 (two) times daily.   Oxycodone HCl 10 MG Tabs Take 1 tablet (10 mg total) by mouth every 6 (six) hours as needed (for pain). What changed: when to take this   pantoprazole 40 MG tablet Commonly known as: PROTONIX Take 1 tablet (40 mg total) by mouth daily.   senna 8.6 MG Tabs tablet Commonly known as: SENOKOT Take 8.6 mg by mouth daily as needed for mild constipation.   sitaGLIPtin 50 MG tablet Commonly known as: JANUVIA Take 50 mg by mouth daily.   triamcinolone cream 0.1 % Commonly known as: KENALOG Apply 1 application topically 2 (two) times daily as needed (skin rash).   Trulicity 3 UL/8.4TX Sopn Generic drug:  Dulaglutide Inject 3 mg into the skin once a week. Every Thursday   Vitamin Deficiency System-B12 1000 MCG/ML Kit Generic drug: Cyanocobalamin Inject 1,000 mcg as directed every 30 (thirty) days. What changed: Another medication with the same name was removed. Continue taking this medication, and follow the directions you see here.        Allergies  Allergen Reactions   Penicillins Hives and Other (See Comments)    HAS PT DEVELOPED SEVERE RASH INVOLVING MUCUS MEMBRANES or SKIN NECROSIS: #  #  YES  #  # PATIENT HAS HAD A PCN REACTION THAT REQUIRED HOSPITALIZATION:  #  #  YES  #  #   Tolerates amoxicillin on multiple occasions per Dr. Darrel Hoover note 11/01/17.  TDD.   Clindamycin/Lincomycin Hives, Dermatitis and Rash   Nsaids Other (See Comments)    Avoid due to kidney failure caused by celebrex    Sulfa Antibiotics Hives   Versed [Midazolam] Nausea And Vomiting    Pt had medication on October 16 2017 with no issues    Consultations: Nephrology CCM admitted patient.    Procedures/Studies: DG Chest 2  View  Result Date: 12/26/2020 CLINICAL DATA:  Hypoxia and urinary retention EXAM: CHEST - 2 VIEW COMPARISON:  03/27/2020 FINDINGS: Cardiac shadow is mildly prominent but stable. Lungs are well aerated bilaterally. No focal infiltrate or effusion is seen. Mild degenerative changes of the thoracic spine are noted. IMPRESSION: No acute abnormality noted. Electronically Signed   By: Inez Catalina M.D.   On: 12/26/2020 01:52   MM 3D SCREEN BREAST BILATERAL  Result Date: 12/13/2020 CLINICAL DATA:  Screening. EXAM: DIGITAL SCREENING BILATERAL MAMMOGRAM WITH TOMOSYNTHESIS AND CAD TECHNIQUE: Bilateral screening digital craniocaudal and mediolateral oblique mammograms were obtained. Bilateral screening digital breast tomosynthesis was performed. The images were evaluated with computer-aided detection. COMPARISON:  Previous exam(s). ACR Breast Density Category a: The breast tissue is almost entirely fatty. FINDINGS: There are no findings suspicious for malignancy. IMPRESSION: No mammographic evidence of malignancy. A result letter of this screening mammogram will be mailed directly to the patient. RECOMMENDATION: Screening mammogram in one year. (Code:SM-B-01Y) BI-RADS CATEGORY  1: Negative. Electronically Signed   By: Nolon Nations M.D.   On: 12/13/2020 11:55   ECHOCARDIOGRAM COMPLETE  Result Date: 12/28/2020    ECHOCARDIOGRAM REPORT   Patient Name:   [name] Date of Exam: 12/28/2020 Medical Rec #:  [id number]         Height:       65.5 in Accession #:    [id number]        Weight:       364.6 lb Date of Birth:  [birth year]         BSA:          2.568 m Patient Age:    50 years          BP:           118/97 mmHg Patient Gender: F                 HR:           81 bpm. Exam Location:  Inpatient Procedure: 2D Echo, Cardiac Doppler, Color Doppler and Intracardiac            Opacification Agent Indications:    Hypotension  History:        Patient has no prior history of Echocardiogram examinations.  Signs/Symptoms:Hypotension and ARF; Risk Factors:Hypertension,                 Diabetes, Sleep Apnea and Morbid obesity.  Sonographer:    Dustin Flock RDCS Referring Phys: [id number] Candee Furbish  Sonographer Comments: Technically difficult study due to poor echo windows and patient is morbidly obese. IMPRESSIONS  1. Technically difficult echo with poor image quality.  2. Left ventricular ejection fraction, by estimation, is 65 to 70%. The left ventricle has normal function. The left ventricle has no regional wall motion abnormalities. Left ventricular diastolic function could not be evaluated.  3. Right ventricular systolic function was not well visualized. The right ventricular size is not well visualized.  4. The mitral valve is grossly normal. No evidence of mitral valve regurgitation.  5. The aortic valve is grossly normal. Aortic valve regurgitation is not visualized. FINDINGS  Left Ventricle: Left ventricular ejection fraction, by estimation, is 65 to 70%. The left ventricle has normal function. The left ventricle has no regional wall motion abnormalities. Definity contrast agent was given IV to delineate the left ventricular  endocardial borders. The left ventricular internal cavity size was normal in size. There is no left ventricular hypertrophy. Left ventricular diastolic function could not be evaluated. Right Ventricle: The right ventricular size is not well visualized. Right vetricular wall thickness was not well visualized. Right ventricular systolic function was not well visualized. Left Atrium: Left atrial size was not well visualized. Right Atrium: Right atrial size was not well visualized. Pericardium: There is no evidence of pericardial effusion. Mitral Valve: The mitral valve is grossly normal. No evidence of mitral valve regurgitation. Tricuspid Valve: The tricuspid valve is normal in structure. Tricuspid valve regurgitation is trivial. Aortic Valve: The aortic valve is grossly normal.  Aortic valve regurgitation is not visualized. Pulmonic Valve: The pulmonic valve was normal in structure. Pulmonic valve regurgitation is not visualized. Aorta: The aortic root and ascending aorta are structurally normal, with no evidence of dilitation. IAS/Shunts: The interatrial septum was not well visualized.  LEFT VENTRICLE PLAX 2D LVIDd:         4.50 cm LVIDs:         2.70 cm LV PW:         1.30 cm LV IVS:        1.20 cm LVOT diam:     2.10 cm LV SV:         47 LV SV Index:   18 LVOT Area:     3.46 cm  LEFT ATRIUM         Index LA diam:    3.70 cm 1.44 cm/m  AORTIC VALVE LVOT Vmax:   77.50 cm/s LVOT Vmean:  53.800 cm/s LVOT VTI:    0.136 m  AORTA Ao Root diam: 2.50 cm MITRAL VALVE MV Area (PHT): 4.74 cm    SHUNTS MV Decel Time: 160 msec    Systemic VTI:  0.14 m MV E velocity: 77.60 cm/s  Systemic Diam: 2.10 cm MV A velocity: 60.80 cm/s MV E/A ratio:  1.28 Mertie Moores MD Electronically signed by Mertie Moores MD Signature Date/Time: 12/28/2020/12:30:40 PM    Final    CT Renal Stone Study  Result Date: 12/26/2020 CLINICAL DATA:  50 year old female with history of flank pain. Suspected kidney stone. EXAM: CT ABDOMEN AND PELVIS WITHOUT CONTRAST TECHNIQUE: Multidetector CT imaging of the abdomen and pelvis was performed following the standard protocol without IV contrast. COMPARISON:  CT the abdomen and pelvis 03/26/2020. FINDINGS: Lower  chest: Areas of mild scarring are noted in the lung bases bilaterally. Hepatobiliary: Diffuse low attenuation throughout the hepatic parenchyma, indicative of severe hepatic steatosis. No discrete cystic or solid hepatic lesions are confidently identified on today's noncontrast CT examination. Status post cholecystectomy. Pancreas: No definite pancreatic mass or peripancreatic fluid collections or inflammatory changes are noted on today's noncontrast CT examination. Spleen: Unremarkable. Adrenals/Urinary Tract: There are no abnormal calcifications within the collecting  system of either kidney, along the course of either ureter, or within the lumen of the urinary bladder. No hydroureteronephrosis or perinephric stranding to suggest urinary tract obstruction at this time. The unenhanced appearance of the kidneys is unremarkable bilaterally. Urinary bladder is completely decompressed around an indwelling Foley balloon catheter, but is otherwise normal in appearance. Bilateral adrenal glands are normal in appearance. Stomach/Bowel: Unenhanced appearance of the stomach is normal. No pathologic dilatation of small bowel or colon. Normal appendix. Vascular/Lymphatic: No atherosclerotic calcifications in the abdominal aorta or pelvic vasculature. No lymphadenopathy noted in the abdomen or pelvis. Reproductive: IUD present in the uterus. Uterus and ovaries are otherwise unremarkable in appearance. Other: No significant volume of ascites.  No pneumoperitoneum. Musculoskeletal: Mixed attenuation partially calcified mass in the medial aspect of the left gluteal region, similar to prior studies, measuring up to 7.4 x 4.5 cm on today's examination, compatible with a benign lesions such as fat necrosis or dystrophic posttraumatic calcifications. There are no aggressive appearing lytic or blastic lesions noted in the visualized portions of the skeleton. IMPRESSION: 1. No acute findings are noted in the abdomen or pelvis to account for the patient's symptoms. Specifically, no urinary tract calculus and no findings of urinary tract obstruction. 2. Severe hepatic steatosis. 3. Additional incidental findings, as above. Electronically Signed   By: Vinnie Langton M.D.   On: 12/26/2020 12:03     Subjective: She is alert, feeling well.   Discharge Exam: Vitals:   12/30/20 0329 12/30/20 0733  BP: (!) 159/76 (!) 102/54  Pulse: 84 80  Resp: 17 18  Temp: 98 F (36.7 C) 97.9 F (36.6 C)  SpO2: 99% 96%     General: Pt is alert, awake, not in acute distress Cardiovascular: RRR, S1/S2 +, no  rubs, no gallops Respiratory: CTA bilaterally, no wheezing, no rhonchi Abdominal: Soft, NT, ND, bowel sounds + Extremities: no edema, no cyanosis    The results of significant diagnostics from this hospitalization (including imaging, microbiology, ancillary and laboratory) are listed below for reference.     Microbiology: Recent Results (from the past 240 hour(s))  Resp Panel by RT-PCR (Flu A&B, Covid) Nasopharyngeal Swab     Status: None   Collection Time: 12/26/20 12:55 AM   Specimen: Nasopharyngeal Swab; Nasopharyngeal(NP) swabs in vial transport medium  Result Value Ref Range Status   SARS Coronavirus 2 by RT PCR NEGATIVE NEGATIVE Final    Comment: (NOTE) SARS-CoV-2 target nucleic acids are NOT DETECTED.  The SARS-CoV-2 RNA is generally detectable in upper respiratory specimens during the acute phase of infection. The lowest concentration of SARS-CoV-2 viral copies this assay can detect is 138 copies/mL. A negative result does not preclude SARS-Cov-2 infection and should not be used as the sole basis for treatment or other patient management decisions. A negative result may occur with  improper specimen collection/handling, submission of specimen other than nasopharyngeal swab, presence of viral mutation(s) within the areas targeted by this assay, and inadequate number of viral copies(<138 copies/mL). A negative result must be combined with clinical observations, patient history,  and epidemiological information. The expected result is Negative.  Fact Sheet for Patients:  EntrepreneurPulse.com.au  Fact Sheet for Healthcare Providers:  IncredibleEmployment.be  This test is no t yet approved or cleared by the [place] FDA and  has been authorized for detection and/or diagnosis of SARS-CoV-2 by FDA under an Emergency Use Authorization (EUA). This EUA will remain  in effect (meaning this test can be used) for the duration of  the COVID-19 declaration under Section 564(b)(1) of the Act, 21 U.S.C.section 360bbb-3(b)(1), unless the authorization is terminated  or revoked sooner.       Influenza A by PCR NEGATIVE NEGATIVE Final   Influenza B by PCR NEGATIVE NEGATIVE Final    Comment: (NOTE) The Xpert Xpress SARS-CoV-2/FLU/RSV plus assay is intended as an aid in the diagnosis of influenza from Nasopharyngeal swab specimens and should not be used as a sole basis for treatment. Nasal washings and aspirates are unacceptable for Xpert Xpress SARS-CoV-2/FLU/RSV testing.  Fact Sheet for Patients: EntrepreneurPulse.com.au  Fact Sheet for Healthcare Providers: IncredibleEmployment.be  This test is not yet approved or cleared by the [place] FDA and has been authorized for detection and/or diagnosis of SARS-CoV-2 by FDA under an Emergency Use Authorization (EUA). This EUA will remain in effect (meaning this test can be used) for the duration of the COVID-19 declaration under Section 564(b)(1) of the Act, 21 U.S.C. section 360bbb-3(b)(1), unless the authorization is terminated or revoked.  Performed at [facility], [place] [address], [place], [place] [address]   Urine Culture     Status: None   Collection Time: 12/26/20  9:39 AM   Specimen: Urine, Catheterized  Result Value Ref Range Status   Specimen Description URINE, CATHETERIZED  Final   Special Requests NONE  Final   Culture   Final    NO GROWTH Performed at [facility] [address] [place], [place] [address]    Report Status 12/27/2020 FINAL  Final  MRSA Next Gen by PCR, Nasal     Status: None   Collection Time: 12/26/20  9:15 PM   Specimen: Nasal Mucosa; Nasal Swab  Result Value Ref Range Status   MRSA by PCR Next Gen NOT DETECTED NOT DETECTED Final    Comment: (NOTE) The GeneXpert MRSA Assay (FDA approved for NASAL specimens only), is one component of a comprehensive MRSA colonization  surveillance program. It is not intended to diagnose MRSA infection nor to guide or monitor treatment for MRSA infections. Test performance is not FDA approved in patients less than 29 years old. Performed at [facility], [place] [address] [place], [place] [address]      Labs: BNP (last 3 results) No results for input(s): BNP in the last 8760 hours. Basic Metabolic Panel: Recent Labs  Lab 12/26/20 2145 12/27/20 0310 12/28/20 0400 12/29/20 0232 12/30/20 0211  NA 132* 134* 134* 138 136  K 4.7 4.3 4.6 4.9 5.0  CL 107 107 107 106 104  CO2 17* 20* 21* 26 27  GLUCOSE 322* 267* 287* 244* 364*  BUN 51* 48* 29* 21* 16  CREATININE 4.29* 2.93* 1.22* 0.98 0.99  CALCIUM 7.2* 7.3* 8.3* 8.9 8.9  MG  --  1.6* 2.0 1.6* 1.9  PHOS  --  4.2 2.6 2.5 2.6   Liver Function Tests: No results for input(s): AST, ALT, ALKPHOS, BILITOT, PROT, ALBUMIN in the last 168 hours. No results for input(s): LIPASE, AMYLASE in the last 168 hours. No results for input(s): AMMONIA in  the last 168 hours. CBC: Recent Labs  Lab 12/26/20 0102 12/26/20 1956 12/26/20 2145 12/27/20 0310  WBC 11.7*  --  11.4* 8.9  NEUTROABS 6.7  --   --   --   HGB 12.5 11.6* 12.2 11.7*  HCT 39.6 34.0* 37.8 35.6*  MCV 97.8  --  96.4 95.2  PLT 188  --  182 173   Cardiac Enzymes: Recent Labs  Lab 12/27/20 0310 12/28/20 1110  CKTOTAL 1,158* 827*   BNP: Invalid input(s): POCBNP CBG: Recent Labs  Lab 12/29/20 0802 12/29/20 1133 12/29/20 1607 12/29/20 2335 12/30/20 0731  GLUCAP 238* 294* 269* 335* 253*   D-Dimer No results for input(s): DDIMER in the last 72 hours. Hgb A1c No results for input(s): HGBA1C in the last 72 hours. Lipid Profile No results for input(s): CHOL, HDL, LDLCALC, TRIG, CHOLHDL, LDLDIRECT in the last 72 hours. Thyroid function studies No results for input(s): TSH, T4TOTAL, T3FREE, THYROIDAB in the last 72 hours.  Invalid input(s): FREET3 Anemia work up Recent Labs    12/30/20 0812   VITAMINB12 672  FERRITIN 50  TIBC 305  IRON 58   Urinalysis    Component Value Date/Time   COLORURINE AMBER (A) 12/26/2020 1034   APPEARANCEUR CLOUDY (A) 12/26/2020 1034   LABSPEC 1.020 12/26/2020 1034   PHURINE 5.0 12/26/2020 1034   GLUCOSEU 50 (A) 12/26/2020 1034   HGBUR NEGATIVE 12/26/2020 1034   BILIRUBINUR SMALL (A) 12/26/2020 1034   KETONESUR 5 (A) 12/26/2020 1034   PROTEINUR NEGATIVE 12/26/2020 1034   UROBILINOGEN 0.2 09/05/2012 1141   NITRITE NEGATIVE 12/26/2020 1034   LEUKOCYTESUR NEGATIVE 12/26/2020 1034   Sepsis Labs Invalid input(s): PROCALCITONIN,  WBC,  LACTICIDVEN Microbiology Recent Results (from the past 240 hour(s))  Resp Panel by RT-PCR (Flu A&B, Covid) Nasopharyngeal Swab     Status: None   Collection Time: 12/26/20 12:55 AM   Specimen: Nasopharyngeal Swab; Nasopharyngeal(NP) swabs in vial transport medium  Result Value Ref Range Status   SARS Coronavirus 2 by RT PCR NEGATIVE NEGATIVE Final    Comment: (NOTE) SARS-CoV-2 target nucleic acids are NOT DETECTED.  The SARS-CoV-2 RNA is generally detectable in upper respiratory specimens during the acute phase of infection. The lowest concentration of SARS-CoV-2 viral copies this assay can detect is 138 copies/mL. A negative result does not preclude SARS-Cov-2 infection and should not be used as the sole basis for treatment or other patient management decisions. A negative result may occur with  improper specimen collection/handling, submission of specimen other than nasopharyngeal swab, presence of viral mutation(s) within the areas targeted by this assay, and inadequate number of viral copies(<138 copies/mL). A negative result must be combined with clinical observations, patient history, and epidemiological information. The expected result is Negative.  Fact Sheet for Patients:  EntrepreneurPulse.com.au  Fact Sheet for Healthcare Providers:   IncredibleEmployment.be  This test is no t yet approved or cleared by the [place] FDA and  has been authorized for detection and/or diagnosis of SARS-CoV-2 by FDA under an Emergency Use Authorization (EUA). This EUA will remain  in effect (meaning this test can be used) for the duration of the COVID-19 declaration under Section 564(b)(1) of the Act, 21 U.S.C.section 360bbb-3(b)(1), unless the authorization is terminated  or revoked sooner.       Influenza A by PCR NEGATIVE NEGATIVE Final   Influenza B by PCR NEGATIVE NEGATIVE Final    Comment: (NOTE) The Xpert Xpress SARS-CoV-2/FLU/RSV plus assay is intended as an aid in the diagnosis  of influenza from Nasopharyngeal swab specimens and should not be used as a sole basis for treatment. Nasal washings and aspirates are unacceptable for Xpert Xpress SARS-CoV-2/FLU/RSV testing.  Fact Sheet for Patients: EntrepreneurPulse.com.au  Fact Sheet for Healthcare Providers: IncredibleEmployment.be  This test is not yet approved or cleared by the [place] FDA and has been authorized for detection and/or diagnosis of SARS-CoV-2 by FDA under an Emergency Use Authorization (EUA). This EUA will remain in effect (meaning this test can be used) for the duration of the COVID-19 declaration under Section 564(b)(1) of the Act, 21 U.S.C. section 360bbb-3(b)(1), unless the authorization is terminated or revoked.  Performed at [facility], [place] [address] [place], [place] [address]   Urine Culture     Status: None   Collection Time: 12/26/20  9:39 AM   Specimen: Urine, Catheterized  Result Value Ref Range Status   Specimen Description URINE, CATHETERIZED  Final   Special Requests NONE  Final   Culture   Final    NO GROWTH Performed at [facility] [address] [place], [place] [address]    Report Status 12/27/2020 FINAL  Final  MRSA Next Gen by PCR, Nasal      Status: None   Collection Time: 12/26/20  9:15 PM   Specimen: Nasal Mucosa; Nasal Swab  Result Value Ref Range Status   MRSA by PCR Next Gen NOT DETECTED NOT DETECTED Final    Comment: (NOTE) The GeneXpert MRSA Assay (FDA approved for NASAL specimens only), is one component of a comprehensive MRSA colonization surveillance program. It is not intended to diagnose MRSA infection nor to guide or monitor treatment for MRSA infections. Test performance is not FDA approved in patients less than 58 years old. Performed at [facility], [place] [address], [place], [place] [address]      Time coordinating discharge: 40 minutes  SIGNED:   Elmarie Shiley, MD  [facility]

## 2020-12-30 NOTE — Plan of Care (Signed)

## 2020-12-30 NOTE — Progress Notes (Signed)
[name] to be D/C'd  per MD order.  Discussed with the patient and all questions fully answered.  VSS, Skin clean, dry and intact without evidence of skin break down, no evidence of skin tears noted.  IV catheter discontinued intact. Site without signs and symptoms of complications. Dressing and pressure applied.  An After Visit Summary was printed and given to the patient. Patient received prescription.  D/c education completed with patient/family including follow up instructions, medication list, d/c activities limitations if indicated, with other d/c instructions as indicated by MD - patient able to verbalize understanding, all questions fully answered.   Patient instructed to return to ED, call 911, or call MD for any changes in condition.   Patient to be escorted via [place], and D/C home via private auto.

## 2021-01-03 ENCOUNTER — Telehealth: Payer: Self-pay | Admitting: Orthopaedic Surgery

## 2021-01-03 NOTE — Telephone Encounter (Signed)
Pt stated she picked the provider she wanted to use for her P.T. option. She wants to use [facility] for both knees. The best call back number is [phone].

## 2021-01-04 ENCOUNTER — Other Ambulatory Visit: Payer: Self-pay

## 2021-01-04 DIAGNOSIS — G8929 Other chronic pain: Secondary | ICD-10-CM

## 2021-01-16 NOTE — Therapy (Incomplete)
OUTPATIENT PHYSICAL THERAPY LOWER EXTREMITY EVALUATION   Patient Name: [name] MRN: [id number] DOB:1971/01/05, 50 y.o., female Today's Date: 01/16/2021    Past Medical History:  Diagnosis Date   Acute renal failure (ARF) ([place]) 09/02/2012   Anemia    Anxiety    panic attacks   Back pain    Blood transfusion    Chronic heel ulcer ([place])    Chronic kidney disease    Chronic pain syndrome    Depression    Diabetes mellitus    type II    DJD (degenerative joint disease)    Dysrhythmia    pt unsure what this was   Fibromyalgia    GERD (gastroesophageal reflux disease)    H/O: Bell's palsy 2011   Heart murmur    "slight one"   History of kidney stones    History of MRSA infection OF ULCER   Hypertension    no longer taken since 10-11 months per patient at preop phone call of 10/13/2017    Hypothyroidism    Hypoventilation associated with obesity syndrome (HCC)    Migraine    Non-healing non-surgical wound    Right hip, has Wound vac to hip.  Started as a skin tear.   Peripheral vascular disease (HCC)    PONV (postoperative nausea and vomiting)    can be slow to wake up after surgery   Right foot drop    Shortness of breath    with Activity   Sleep apnea    "study shows not bad enough for CPAP.", pt denies   Umbilical hernia    Past Surgical History:  Procedure Laterality Date   BACK SURGERY     for lumbar disc disease X2   BRAIN SURGERY  1970   Tumor removed- has steel plate   BRAIN SURGERY      Plating due to soft spot closing too early- age 37   BREAST LUMPECTOMY WITH NEEDLE LOCALIZATION Right 08/23/2013   Procedure: RIGHT BREAST LUMPECTOMY WITH NEEDLE LOCALIZATION;  Surgeon: Odis Hollingshead, MD;  Location: [facility];  Service: General;  Laterality: Right;   CHOLECYSTECTOMY  9937   EXCISION UMBILICAL NODULE N/A 1/69/6789   Procedure: EXCISION OF UMBILICUS;  Surgeon: Coralie Keens, MD;  Location: [facility];  Service: General;  Laterality: N/A;   EYE  SURGERY     eye lift   I & D EXTREMITY  04/02/2011   Procedure: IRRIGATION AND DEBRIDEMENT EXTREMITY;  Surgeon: Mcarthur Rossetti;  Location: [facility];  Service: Orthopedics;  Laterality: Right;  I&D right heel ulcer, placement of A-cell graft   INCISION AND DRAINAGE ABSCESS N/A 10/28/2017   Procedure: INCISION AND DRAINAGE UMBILICAL HERNIA ABSCESS;  Surgeon: Ralene Ok, MD;  Location: [facility];  Service: General;  Laterality: N/A;   INCISION AND DRAINAGE OF WOUND  08/29/2011   Procedure: IRRIGATION AND DEBRIDEMENT WOUND;  Surgeon: Theodoro Kos, DO;  Location: [facility];  Service: Plastics;  Laterality: Right;   INSERTION OF MESH N/A 10/16/2017   Procedure: INSERTION OF MESH;  Surgeon: Coralie Keens, MD;  Location: [facility];  Service: General;  Laterality: N/A;   LITHOTRIPSY     2007ish   right elbow     UMBILICAL HERNIA REPAIR N/A 10/16/2017   Procedure: UMBILICAL HERNIA REPAIR WITH MESH;  Surgeon: Coralie Keens, MD;  Location: [facility];  Service: General;  Laterality: N/A;   Patient Active Problem List   Diagnosis Date Noted   Shock ([place])    Acute renal  failure (ARF) ([facility]) 47/42/5956   Acute metabolic encephalopathy 38/75/6433   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   AKI (acute kidney injury) ([place]) 03/07/2020   Seizures ([place]) 03/07/2020   Acute lower UTI 03/07/2020   Tremor 03/07/2020   History of 2019 novel coronavirus disease (COVID-19) 03/25/2019   Depression with anxiety 03/25/2019   Chest pain 03/24/2019   Postoperative abscess 29/51/8841   Umbilical hernia, incarcerated, s/p repair 10/16/2017 10/16/2017   Chronic pain of both knees 04/23/2017   Unilateral primary osteoarthritis, left knee 10/24/2016   Unilateral primary osteoarthritis, right knee 10/24/2016   Abnormal mammogram with microcalcification-Right inner lower quadrant 07/21/2013   Oliguria and anuria 09/02/2012   Constipation 03/02/2011   Lethargy 03/01/2011   Heel ulcer due to DM ([place]) 02/19/2011   Wound, open, hip  or thigh 02/19/2011   Healing pressure ulcer stage III ([place]) 02/18/2011   Morbidly obese ([place]) 02/18/2011   Diabetes mellitus type 2 with complications, uncontrolled ([place]) 02/18/2011   Sleep apnea 02/18/2011   Hypothyroidism 02/18/2011   Chronic pain 02/18/2011   Acute lymphangitis 02/18/2011   Cellulitis of foot 02/18/2011    PCP: Pecolia Ades, NP  REFERRING PROVIDER: Mcarthur Rossetti, MD   REFERRING DIAG: [phone] (ICD-10-CM) - Chronic pain of left knee M25.561,G89.29 (ICD-10-CM) - Chronic pain of right knee   THERAPY DIAG:  No diagnosis found.  ONSET DATE: ***  SUBJECTIVE:   SUBJECTIVE STATEMENT: ***  PERTINENT HISTORY: ***  PAIN:  Are you having pain? {yes/no:20286} VAS scale: ***/10 Pain location: *** Pain orientation: {Pain Orientation:25161}  PAIN TYPE: {type:313116} Pain description: {PAIN DESCRIPTION:21022940}  Aggravating factors: *** Relieving factors: ***  PRECAUTIONS: {Therapy precautions:24002}  WEIGHT BEARING RESTRICTIONS {Yes ***/No:24003}  FALLS:  Has patient fallen in last 6 months? {yes/no:20286}, Number of falls: ***  LIVING ENVIRONMENT: Lives with: {[facility] lives with:25569::"lives with their family"} Lives in: {Lives in:25570} Stairs: {yes/no:20286}; {Stairs:24000} Has following equipment at home: {Assistive devices:23999}  OCCUPATION: ***  PLOF: {PLOF:24004}  PATIENT GOALS ***   OBJECTIVE:   DIAGNOSTIC FINDINGS: ***  PATIENT SURVEYS:  {rehab surveys:24030}  COGNITION:  Overall cognitive status: {cognition:24006}     SENSATION:  Light touch: {intact/deficits:24005}  Stereognosis: {intact/deficits:24005}  Hot/Cold: {intact/deficits:24005}  Proprioception: {intact/deficits:24005}  MUSCLE LENGTH: Hamstrings: Right *** deg; Left *** deg Thomas test: Right *** deg; Left *** deg  POSTURE:  ***  LE AROM/PROM:  A/PROM Right 01/16/2021 Left 01/16/2021  Hip flexion    Hip extension    Hip abduction     Hip adduction    Hip internal rotation    Hip external rotation    Knee flexion    Knee extension    Ankle dorsiflexion    Ankle plantarflexion    Ankle inversion    Ankle eversion     (Blank rows = not tested)  LE MMT:  MMT Right 01/16/2021 Left 01/16/2021  Hip flexion    Hip extension    Hip abduction    Hip adduction    Hip internal rotation    Hip external rotation    Knee flexion    Knee extension    Ankle dorsiflexion    Ankle plantarflexion    Ankle inversion    Ankle eversion     (Blank rows = not tested)  LOWER EXTREMITY SPECIAL TESTS:  {LEspecialtests:26242}  JOINT MOBILITY ASSESSMENT:  ***  FUNCTIONAL TESTS:  {Functional tests:24029}  GAIT: Distance walked: *** Assistive device utilized: {Assistive devices:23999} Level of assistance: {Levels of assistance:24026} Comments: ***  TODAY'S TREATMENT: ***   PATIENT EDUCATION:  Education details: *** Person educated: {Person educated:25204} Education method: {Education Method:25205} Education comprehension: {Education Comprehension:25206}   HOME EXERCISE PROGRAM: ***  ASSESSMENT:  CLINICAL IMPRESSION: Patient is a *** y.o. *** who was seen today for physical therapy evaluation and treatment for ***. Objective impairments include {opptimpairments:25111}. These impairments are limiting patient from {activity limitations:25113}. Personal factors including {Personal factors:25162} are also affecting patient's functional outcome. Patient will benefit from skilled PT to address above impairments and improve overall function.  REHAB POTENTIAL: {rehabpotential:25112}  CLINICAL DECISION MAKING: {clinical decision making:25114}  EVALUATION COMPLEXITY: {Evaluation complexity:25115}   GOALS: Goals reviewed with patient? {yes/no:20286}  SHORT TERM GOALS:  STG Name Target Date Goal status  1 *** Baseline:  {follow up:25551} {GOALSTATUS:25110}  2 *** Baseline:  {follow up:25551}  {GOALSTATUS:25110}  3 *** Baseline: {follow up:25551} {GOALSTATUS:25110}  4 *** Baseline: {follow up:25551} {GOALSTATUS:25110}  5 *** Baseline: {follow up:25551} {GOALSTATUS:25110}  6 *** Baseline: {follow up:25551} {GOALSTATUS:25110}  7 *** Baseline: {follow up:25551} {GOALSTATUS:25110}   LONG TERM GOALS:   LTG Name Target Date Goal status  1 *** Baseline: {follow up:25551} {GOALSTATUS:25110}  2 *** Baseline: {follow up:25551} {GOALSTATUS:25110}  3 *** Baseline: {follow up:25551} {GOALSTATUS:25110}  4 *** Baseline: {follow up:25551} {GOALSTATUS:25110}  5 *** Baseline: {follow up:25551} {GOALSTATUS:25110}  6 *** Baseline: {follow up:25551} {GOALSTATUS:25110}  7 *** Baseline: {follow up:25551} {GOALSTATUS:25110}   PLAN: PT FREQUENCY: {rehab frequency:25116}  PT DURATION: {rehab duration:25117}  PLANNED INTERVENTIONS: {rehab planned interventions:25118::"Therapeutic exercises","Therapeutic activity","Neuro Muscular re-education","Balance training","Gait training","Patient/Family education","Joint mobilization"}  PLAN FOR NEXT SESSION: Daleen Bo 01/16/2021, 4:38 PM

## 2021-01-17 ENCOUNTER — Ambulatory Visit (HOSPITAL_BASED_OUTPATIENT_CLINIC_OR_DEPARTMENT_OTHER): Payer: Medicare HMO | Attending: Orthopaedic Surgery | Admitting: Physical Therapy

## 2021-02-10 ENCOUNTER — Emergency Department (HOSPITAL_BASED_OUTPATIENT_CLINIC_OR_DEPARTMENT_OTHER)
Admission: EM | Admit: 2021-02-10 | Discharge: 2021-02-10 | Disposition: A | Discharge status: Home / Self Care | Payer: Medicare HMO | Attending: Emergency Medicine | Admitting: Emergency Medicine

## 2021-02-10 ENCOUNTER — Emergency Department (HOSPITAL_BASED_OUTPATIENT_CLINIC_OR_DEPARTMENT_OTHER): Payer: Medicare HMO

## 2021-02-10 ENCOUNTER — Encounter (HOSPITAL_BASED_OUTPATIENT_CLINIC_OR_DEPARTMENT_OTHER): Payer: Self-pay | Admitting: Urology

## 2021-02-10 DIAGNOSIS — Z79899 Other long term (current) drug therapy: Secondary | ICD-10-CM | POA: Diagnosis not present

## 2021-02-10 DIAGNOSIS — Z7984 Long term (current) use of oral hypoglycemic drugs: Secondary | ICD-10-CM | POA: Insufficient documentation

## 2021-02-10 DIAGNOSIS — M25562 Pain in left knee: Secondary | ICD-10-CM | POA: Insufficient documentation

## 2021-02-10 DIAGNOSIS — G8929 Other chronic pain: Secondary | ICD-10-CM | POA: Insufficient documentation

## 2021-02-10 DIAGNOSIS — I129 Hypertensive chronic kidney disease with stage 1 through stage 4 chronic kidney disease, or unspecified chronic kidney disease: Secondary | ICD-10-CM | POA: Diagnosis not present

## 2021-02-10 DIAGNOSIS — Z794 Long term (current) use of insulin: Secondary | ICD-10-CM | POA: Diagnosis not present

## 2021-02-10 DIAGNOSIS — Z8616 Personal history of COVID-19: Secondary | ICD-10-CM | POA: Insufficient documentation

## 2021-02-10 DIAGNOSIS — M79605 Pain in left leg: Secondary | ICD-10-CM | POA: Diagnosis present

## 2021-02-10 DIAGNOSIS — E1122 Type 2 diabetes mellitus with diabetic chronic kidney disease: Secondary | ICD-10-CM | POA: Diagnosis not present

## 2021-02-10 DIAGNOSIS — E039 Hypothyroidism, unspecified: Secondary | ICD-10-CM | POA: Insufficient documentation

## 2021-02-10 DIAGNOSIS — N189 Chronic kidney disease, unspecified: Secondary | ICD-10-CM | POA: Insufficient documentation

## 2021-02-10 IMAGING — US US EXTREM LOW VENOUS*L*
1 series · 14 of 24 positions shown · non-contrast
Comparison: None.

CLINICAL DATA: Rule out DVT.  Swelling for 2 days.

EXAM:
LEFT LOWER EXTREMITY VENOUS DOPPLER ULTRASOUND
TECHNIQUE: Gray-scale sonography with compression, as well as color and duplex
ultrasound, were performed to evaluate the deep venous system(s)
from the level of the common femoral vein through the popliteal and
proximal calf veins.

[Series 1: us extrem low venous*left* · 14 of 43 slices shown]
[im 1/43]
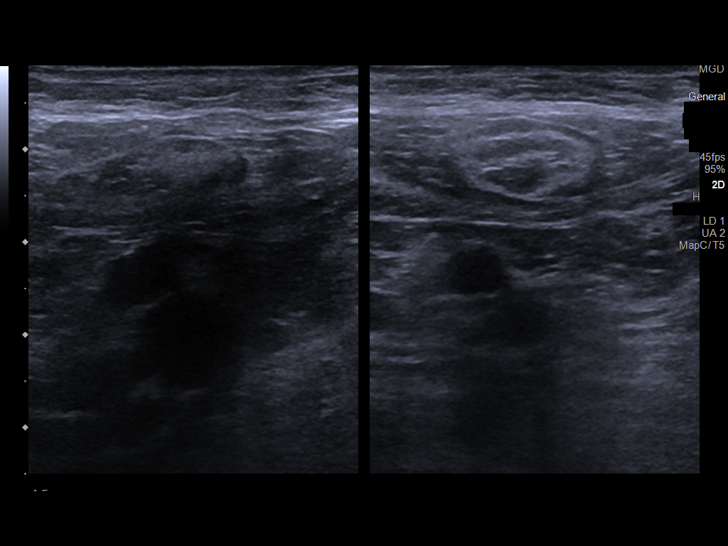
[im 4/43]
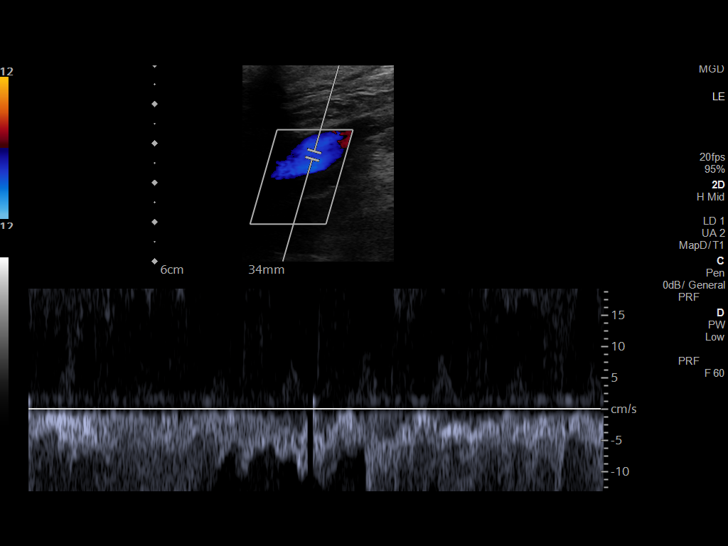
[im 8/43]
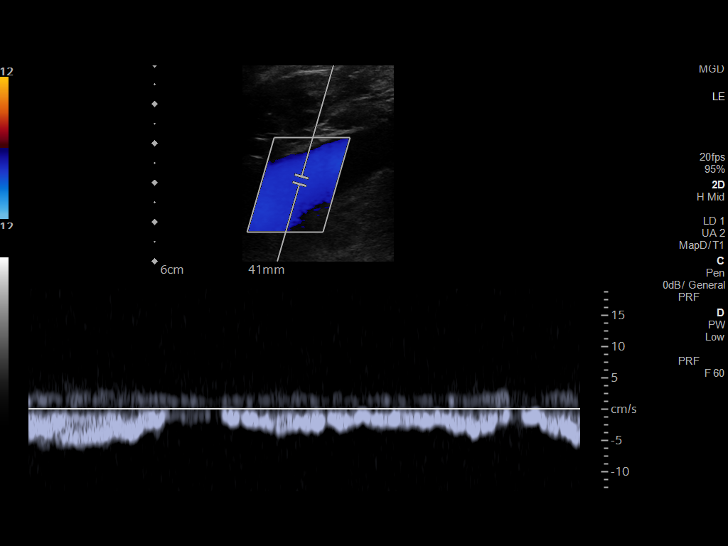
[im 11/43]
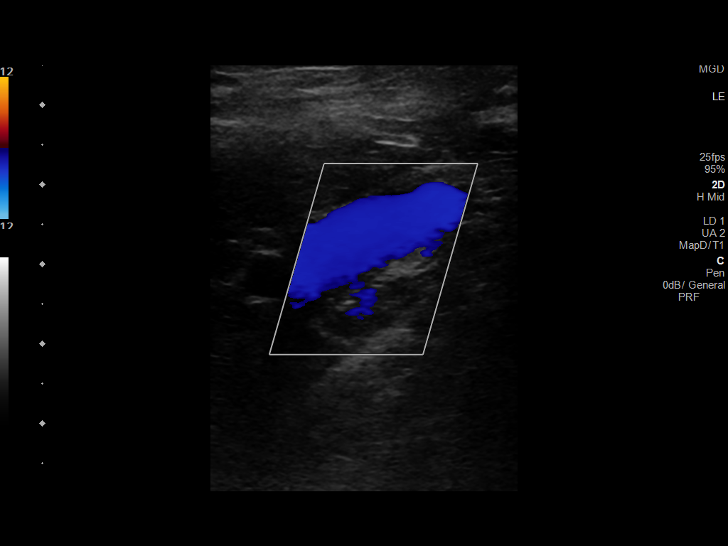
[im 13/43]
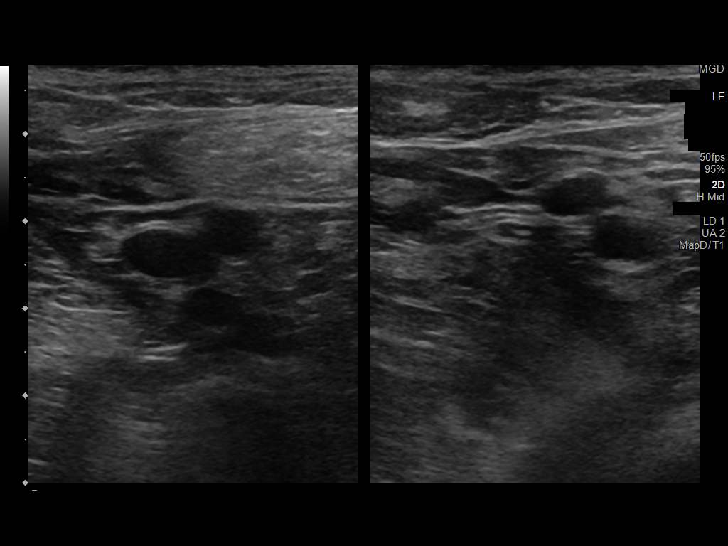
[im 17/43]
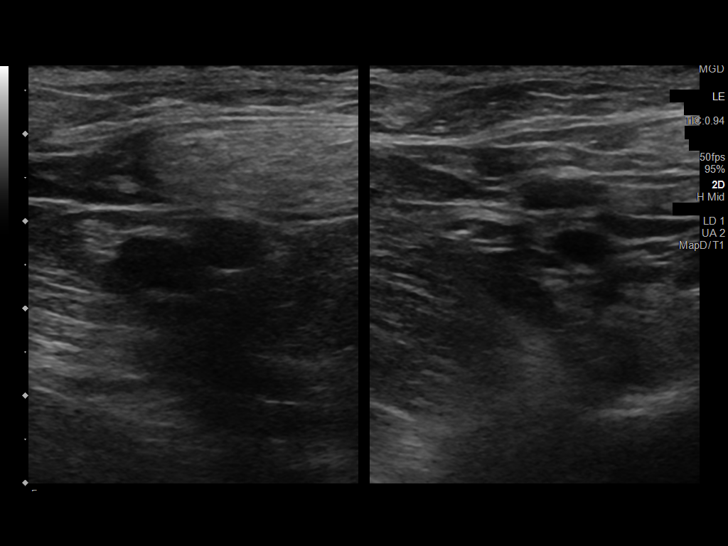
[im 21/43]
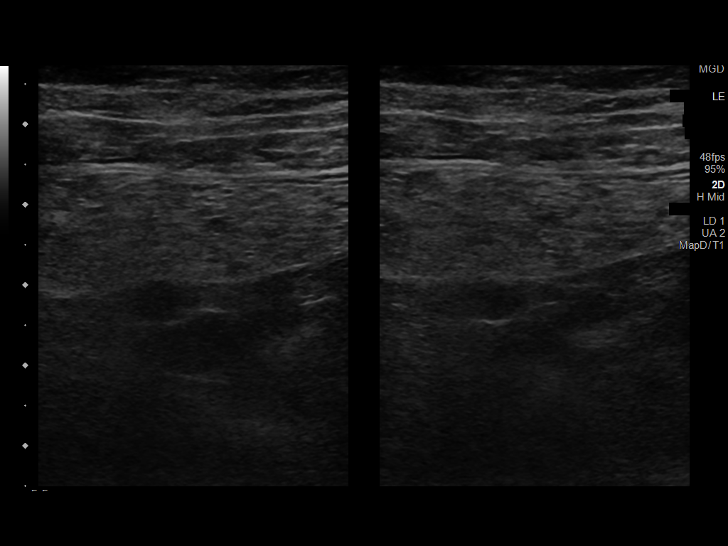
[im 22/43]
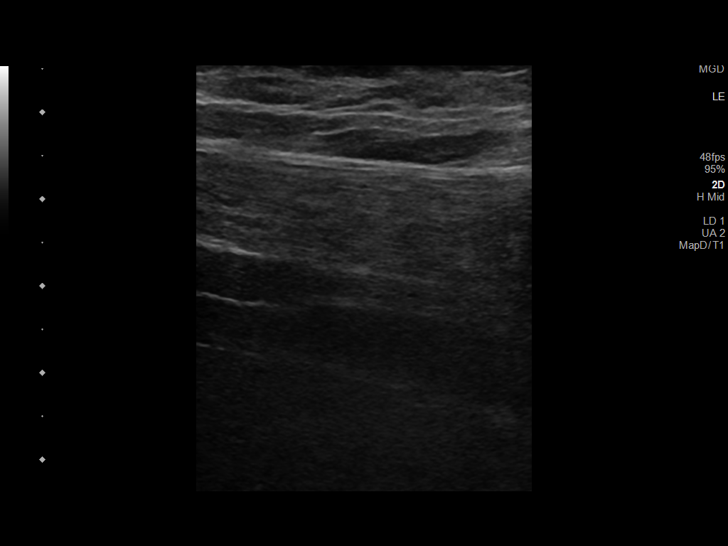
[im 26/43]
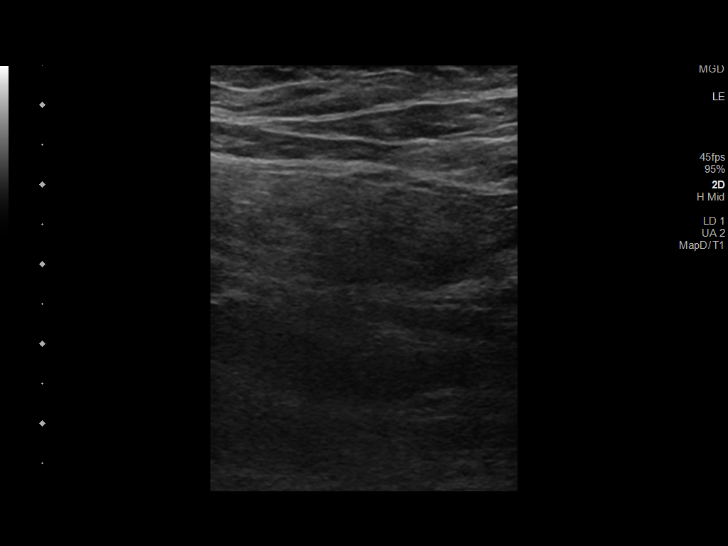
[im 30/43]
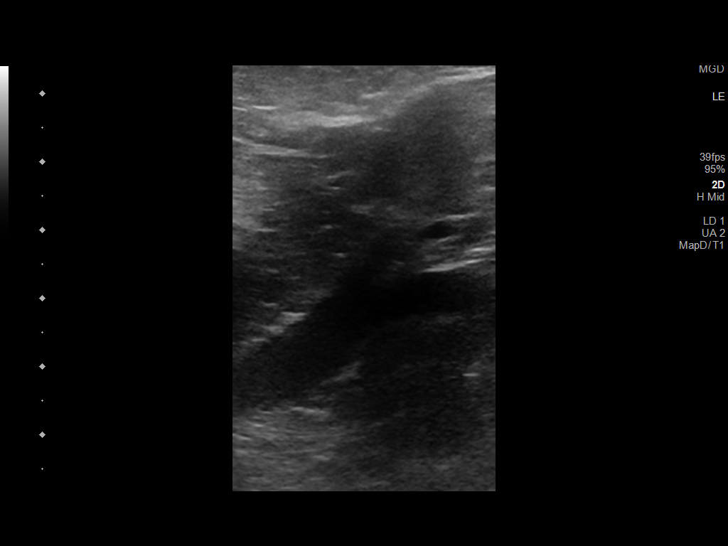
[im 33/43]
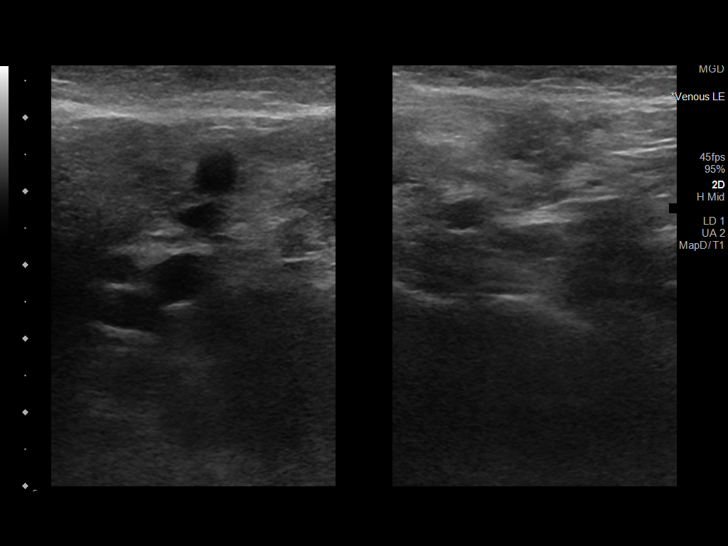
[im 35/43]
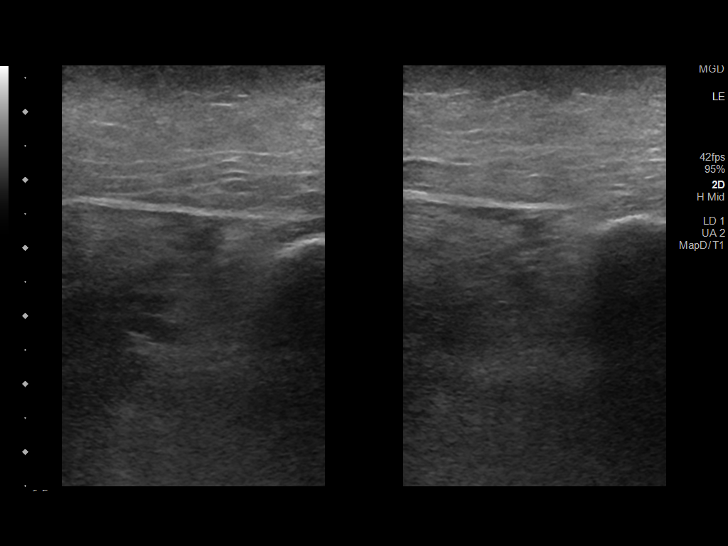
[im 39/43]
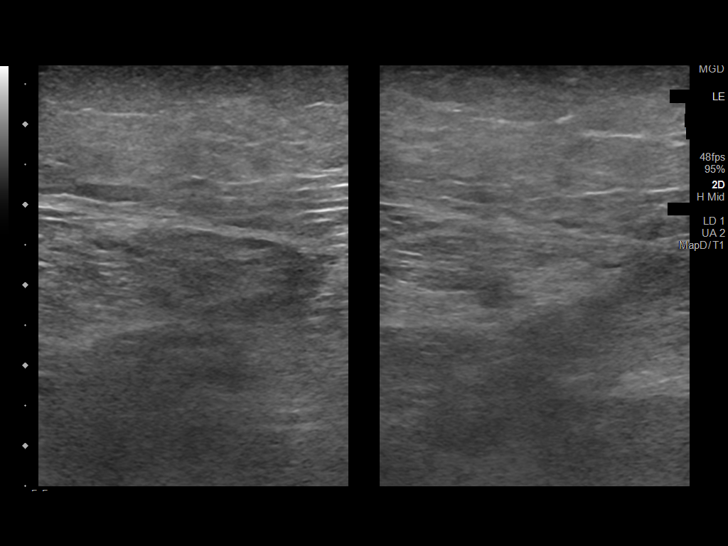
[im 43/43]
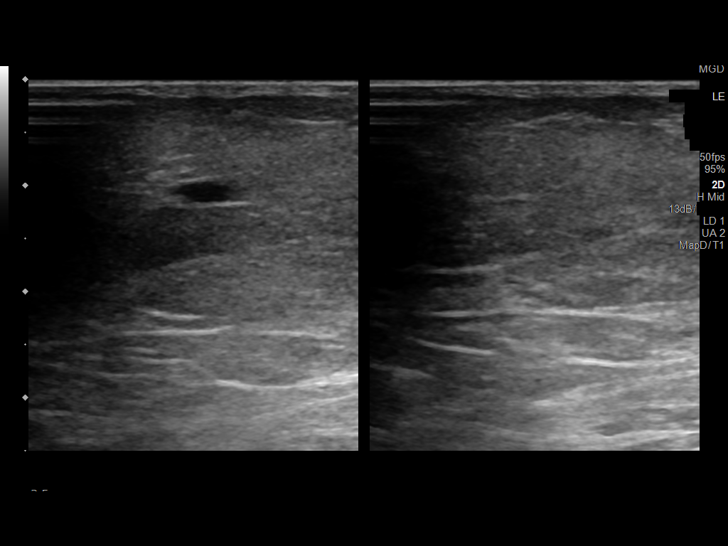

[14 of 24 positions shown; findings below may reference images not displayed]

FINDINGS: VENOUS

Normal compressibility of the common femoral, superficial femoral,
and popliteal veins, as well as the visualized calf veins.
Visualized portions of profunda femoral vein and great saphenous
vein unremarkable. No filling defects to suggest DVT on grayscale or
color Doppler imaging. Doppler waveforms show normal direction of
venous flow, normal respiratory plasticity and response to
augmentation.

Limited views of the contralateral common femoral vein are
unremarkable.

OTHER

None.

Limitations: none
IMPRESSION: Negative.

## 2021-02-10 NOTE — ED Provider Notes (Signed)
[facility] EMERGENCY DEPARTMENT Provider Note   CSN: [id number] Arrival date & time: 02/10/21  1742     History Chief Complaint  Patient presents with   Leg Pain    [name] is a 50 y.o. female.  Sent by PCP for DVT study, left leg pain last several days, Chronic left knee pain.  The history is provided by the patient.  Leg Pain Location:  Leg Leg location:  L leg Pain details:    Quality:  Aching   Radiates to:  Does not radiate   Severity:  Mild   Onset quality:  Gradual   Timing:  Intermittent   Progression:  Waxing and waning Chronicity:  New Relieved by:  Nothing Worsened by:  Nothing Associated symptoms: no back pain, no decreased ROM, no fatigue, no fever, no itching, no muscle weakness, no neck pain, no numbness, no stiffness, no swelling and no tingling       Past Medical History:  Diagnosis Date   Acute renal failure (ARF) (HCC) 09/02/2012   Anemia    Anxiety    panic attacks   Back pain    Blood transfusion    Chronic heel ulcer (HCC)    Chronic kidney disease    Chronic pain syndrome    Depression    Diabetes mellitus    type II    DJD (degenerative joint disease)    Dysrhythmia    pt unsure what this was   Fibromyalgia    GERD (gastroesophageal reflux disease)    H/O: Bell's palsy 2011   Heart murmur    "slight one"   History of kidney stones    History of MRSA infection OF ULCER   Hypertension    no longer taken since 10-11 months per patient at preop phone call of 10/13/2017    Hypothyroidism    Hypoventilation associated with obesity syndrome (HCC)    Migraine    Non-healing non-surgical wound    Right hip, has Wound vac to hip.  Started as a skin tear.   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   AKI (acute kidney injury) ([place]) 03/07/2020   Seizures ([place]) 03/07/2020   Acute lower UTI 03/07/2020   Tremor 03/07/2020   History of 2019 novel coronavirus disease (COVID-19) 03/25/2019   Depression with anxiety 03/25/2019   Chest pain 03/24/2019   Postoperative abscess 63/14/9702   Umbilical hernia, incarcerated, s/p repair 10/16/2017 10/16/2017   Chronic pain of both knees 04/23/2017   Unilateral primary osteoarthritis, left knee 10/24/2016   Unilateral primary osteoarthritis, right knee 10/24/2016   Abnormal mammogram with microcalcification-Right inner lower quadrant 07/21/2013   Oliguria and anuria 09/02/2012   Constipation 03/02/2011   Lethargy 03/01/2011   Heel ulcer due to DM ([place]) 02/19/2011   Wound, open, hip or thigh 02/19/2011   Healing pressure ulcer stage III ([place]) 02/18/2011   Morbidly obese ([place]) 02/18/2011   Diabetes mellitus type 2 with complications, uncontrolled ([place])  02/18/2011   Sleep apnea 02/18/2011   Hypothyroidism 02/18/2011   Chronic pain 02/18/2011   Acute lymphangitis 02/18/2011   Cellulitis of foot 02/18/2011    Past Surgical History:  Procedure Laterality Date   BACK SURGERY     for lumbar disc disease [place]   Tumor removed- has steel plate   BRAIN SURGERY      Plating due to soft spot closing too early- age 69   BREAST LUMPECTOMY WITH NEEDLE LOCALIZATION Right 08/23/2013   Procedure: RIGHT BREAST LUMPECTOMY WITH NEEDLE LOCALIZATION;  Surgeon: Odis Hollingshead, MD;  Location: [facility];  Service: General;  Laterality: Right;   CHOLECYSTECTOMY  9381   EXCISION UMBILICAL NODULE N/A 0/17/5102   Procedure: EXCISION OF UMBILICUS;  Surgeon: Coralie Keens, MD;  Location: [facility];  Service: General;  Laterality: N/A;   EYE  SURGERY     eye lift   I & D EXTREMITY  04/02/2011   Procedure: IRRIGATION AND DEBRIDEMENT EXTREMITY;  Surgeon: Mcarthur Rossetti;  Location: [facility];  Service: Orthopedics;  Laterality: Right;  I&D right heel ulcer, placement of A-cell graft   INCISION AND DRAINAGE ABSCESS N/A 10/28/2017   Procedure: INCISION AND DRAINAGE UMBILICAL HERNIA ABSCESS;  Surgeon: Ralene Ok, MD;  Location: [facility];  Service: General;  Laterality: N/A;   INCISION AND DRAINAGE OF WOUND  08/29/2011   Procedure: IRRIGATION AND DEBRIDEMENT WOUND;  Surgeon: Theodoro Kos, DO;  Location: [facility];  Service: Plastics;  Laterality: Right;   INSERTION OF MESH N/A 10/16/2017   Procedure: INSERTION OF MESH;  Surgeon: Coralie Keens, MD;  [facility];  Service: General;  Laterality: N/A;   LITHOTRIPSY     2007ish   right elbow     UMBILICAL HERNIA REPAIR N/A 10/16/2017   Procedure: UMBILICAL HERNIA REPAIR WITH MESH;  Surgeon: Coralie Keens, MD;  [facility];  Service: General;  Laterality: N/A;     OB History   No obstetric history on file.     Family History  Problem Relation Age of Onset   Diabetes type II Father    Heart attack Father    Peripheral vascular disease Father    Diabetes type II Mother    Anesthesia problems Mother    Heart attack Mother    Peripheral vascular disease Mother    Hypertension Mother     Social History   Tobacco Use   Smoking status: Never   Smokeless tobacco: Never  Vaping Use   Vaping Use: Never used  Substance Use Topics   Alcohol use: No   Drug use: No    Home Medications Prior to Admission medications   Medication Sig Start Date End Date Taking? Authorizing Provider  acetaminophen (TYLENOL) 325 MG tablet Take 2 tablets (650 mg total) by mouth every 6 (six) hours as needed for mild pain (or Fever >/= 101). Patient not taking: Reported on 12/27/2020 04/03/20   Cristal Ford, DO  amitriptyline (ELAVIL) 25 MG tablet Take 1 tablet (25 mg total) by mouth at  bedtime. 04/03/20   Mikhail, Velta Addison, DO  atorvastatin (LIPITOR) 20 MG tablet Take 20 mg by mouth daily.    [provider]  Biotin 10000 MCG TABS Take 10,000 mcg by mouth daily.    [provider]  cetirizine (ZYRTEC) 10 MG tablet Take 10 mg by mouth daily as needed for allergies.    [provider]  Cyanocobalamin (VITAMIN DEFICIENCY SYSTEM-B12) 1000 MCG/ML KIT Inject 1,000  mcg as directed every 30 (thirty) days.    [provider]  Diclofenac Sodium 2 % SOLN Place 2-4 g onto the skin 4 (four) times daily as needed. Patient taking differently: Place 4 g onto the skin 4 (four) times daily as needed (for pain). 10/08/18   Mcarthur Rossetti, MD  DULoxetine (CYMBALTA) 60 MG capsule Take 1 capsule (60 mg total) by mouth 2 (two) times daily. 12/30/20   Regalado, Belkys A, MD  fentaNYL (DURAGESIC) 50 MCG/HR Place 1 patch onto the skin every 3 (three) days. 04/04/20   Mikhail, Velta Addison, DO  gabapentin (NEURONTIN) 600 MG tablet Take 0.5 tablets (300 mg total) by mouth 3 (three) times daily. 04/03/20   Mikhail, Velta Addison, DO  insulin aspart protamine - aspart (NOVOLOG MIX 70/30 FLEXPEN) (70-30) 100 UNIT/ML FlexPen Inject 75 Units into the skin in the morning, at noon, and at bedtime.    [provider]  levETIRAcetam (KEPPRA) 750 MG tablet Take 750 mg by mouth 2 (two) times daily. 03/04/19   [provider]  levothyroxine (SYNTHROID) 150 MCG tablet Take 150 mcg by mouth daily before breakfast.    [provider]  lidocaine (LIDODERM) 5 % Place 3 patches onto the skin See admin instructions. Place 3 patches daily to affected areas and replace every 24 hours 05/30/17   [provider]  NARCAN 4 MG/0.1ML LIQD nasal spray kit Place 4 mg into the nose daily as needed (opioid overdose). 10/22/17   [provider]  omega-3 acid ethyl esters (LOVAZA) 1 g capsule Take 1 capsule by mouth 2 (two) times daily. 09/07/20   [provider]   Oxycodone HCl 10 MG TABS Take 1 tablet (10 mg total) by mouth every 6 (six) hours as needed (for pain). 12/30/20   Regalado, Belkys A, MD  pantoprazole (PROTONIX) 40 MG tablet Take 1 tablet (40 mg total) by mouth daily. 04/04/20   Mikhail, Velta Addison, DO  senna (SENOKOT) 8.6 MG TABS tablet Take 8.6 mg by mouth daily as needed for mild constipation.     [provider]  sitaGLIPtin (JANUVIA) 50 MG tablet Take 50 mg by mouth daily.    [provider]  triamcinolone cream (KENALOG) 0.1 % Apply 1 application topically 2 (two) times daily as needed (skin rash). 12/04/20   [provider]  TRULICITY 3 ST/4.1DQ SOPN Inject 3 mg into the skin once a week. Every Thursday 12/04/20   [provider]    Allergies    Penicillins, Clindamycin/lincomycin, Nsaids, Sulfa antibiotics, and Versed [midazolam]  Review of Systems   Review of Systems  Constitutional:  Negative for chills, fatigue and fever.  HENT:  Negative for ear pain and sore throat.   Eyes:  Negative for pain and visual disturbance.  Respiratory:  Negative for cough and shortness of breath.   Cardiovascular:  Negative for chest pain and palpitations.  Gastrointestinal:  Negative for abdominal pain and vomiting.  Genitourinary:  Negative for dysuria and hematuria.  Musculoskeletal:  Negative for arthralgias, back pain, neck pain and stiffness.       Left calf pain   Skin:  Negative for color change, itching and rash.  Neurological:  Negative for seizures and syncope.  All other systems reviewed and are negative.  Physical Exam Updated Vital Signs BP 129/83 (BP Location: Right Wrist)   Pulse (!) 103   Temp 99.3 F (37.4 C) (Oral)   Resp 20   Ht '5\' 6"'  (1.676 m)   Wt (!) 145.6  kg   SpO2 94%   BMI 51.81 kg/m   Physical Exam Vitals and nursing note reviewed.  Constitutional:      General: She is not in acute distress.    Appearance: She is well-developed.  HENT:     Head: Normocephalic and  atraumatic.  Eyes:     Conjunctiva/sclera: Conjunctivae normal.  Cardiovascular:     Rate and Rhythm: Normal rate and regular rhythm.     Pulses: Normal pulses.     Heart sounds: Normal heart sounds. No murmur heard. Pulmonary:     Effort: Pulmonary effort is normal. No respiratory distress.     Breath sounds: Normal breath sounds.  Abdominal:     Palpations: Abdomen is soft.     Tenderness: There is no abdominal tenderness.  Musculoskeletal:        General: Tenderness (TTP to left hamstring) present. No swelling.     Cervical back: Normal range of motion and neck supple.  Skin:    General: Skin is warm and dry.     Capillary Refill: Capillary refill takes less than 2 seconds.  Neurological:     General: No focal deficit present.     Mental Status: She is alert.     Sensory: No sensory deficit.     Motor: No weakness.  Psychiatric:        Mood and Affect: Mood normal.    ED Results / Procedures / Treatments   Labs (all labs ordered are listed, but only abnormal results are displayed) Labs Reviewed - No data to display  EKG None  Radiology US Venous Img Lower  Left (DVT Study)  Result Date: 02/10/2021 CLINICAL DATA:  Rule out DVT.  Swelling for 2 days. EXAM: LEFT LOWER EXTREMITY VENOUS DOPPLER ULTRASOUND TECHNIQUE: Gray-scale sonography with compression, as well as color and duplex ultrasound, were performed to evaluate the deep venous system(s) from the level of the common femoral vein through the popliteal and proximal calf veins. COMPARISON:  None. FINDINGS: VENOUS Normal compressibility of the common femoral, superficial femoral, and popliteal veins, as well as the visualized calf veins. Visualized portions of profunda femoral vein and great saphenous vein unremarkable. No filling defects to suggest DVT on grayscale or color Doppler imaging. Doppler waveforms show normal direction of venous flow, normal respiratory plasticity and response to augmentation. Limited views of  the contralateral common femoral vein are unremarkable. OTHER None. Limitations: none IMPRESSION: Negative. Electronically Signed   By: Dorise Bullion III M.D.   On: 02/10/2021 19:38    Procedures Procedures   Medications Ordered in ED Medications - No data to display  ED Course  I have reviewed the triage vital signs and the nursing notes.  Pertinent labs & imaging results that were available during my care of the patient were reviewed by me and considered in my medical decision making (see chart for details).    MDM Rules/Calculators/A&P                           [name] is here with left lower leg pain.  History of DVT and sent by primary care doctor for DVT rule out.  History of chronic left knee problems.  Is supposed to start rehab in several weeks as she tries to gain strength for possible knee replacement.  She is wearing a fentanyl patch on her left knee.  She uses muscle relaxants and oral opioids as well.  She has been  having pain up and down her left leg for the last several days.  Pain mostly now in the left hamstring area.  Seems that this is muscular nature.  There is no traumatic process.  She has good pulses in her lower extremities.  DVT study has been ordered.  Overall if negative suspect PT and ongoing pain management with primary care doctor as needed.  DVT study negative.  Discharged in good condition.  Recommend ongoing pain management and primary care doctor.  Believe she will benefit from physical therapy.  This chart was dictated using voice recognition software.  Despite best efforts to proofread,  errors can occur which can change the documentation meaning.   Final Clinical Impression(s) / ED Diagnoses Final diagnoses:  Left leg pain    Rx / DC Orders ED Discharge Orders     None        Lennice Sites, DO 02/10/21 1941

## 2021-02-10 NOTE — ED Notes (Signed)
ED Provider at bedside. 

## 2021-02-10 NOTE — ED Triage Notes (Signed)
Left leg pain x 2 days, sent from PCP to rule out DVT. Pain worsening starts in calf and radiates to thigh.  H/o blood clots

## 2021-02-10 NOTE — Discharge Instructions (Signed)
DVT study is negative.  Continue current pain management per primary care doctor.  Suspect that physical therapy will help with discomfort.

## 2021-02-26 NOTE — Therapy (Incomplete)
OUTPATIENT PHYSICAL THERAPY LOWER EXTREMITY EVALUATION   Patient Name: [name] MRN: [id number] DOB:[birth year], 50 y.o., female Today's Date: 02/26/2021    Past Medical History:  Diagnosis Date   Acute renal failure (ARF) ([place]) 09/02/2012   Anemia    Anxiety    panic attacks   Back pain    Blood transfusion    Chronic heel ulcer ([place])    Chronic kidney disease    Chronic pain syndrome    Depression    Diabetes mellitus    type II    DJD (degenerative joint disease)    Dysrhythmia    pt unsure what this was   Fibromyalgia    GERD (gastroesophageal reflux disease)    H/O: Bell's palsy 2011   Heart murmur    "slight one"   History of kidney stones    History of MRSA infection OF ULCER   Hypertension    no longer taken since 10-11 months per patient at preop phone call of 10/13/2017    Hypothyroidism    Hypoventilation associated with obesity syndrome (HCC)    Migraine    Non-healing non-surgical wound    Right hip, has Wound vac to hip.  Started as a skin tear.   Peripheral vascular disease (HCC)    PONV (postoperative nausea and vomiting)    can be slow to wake up after surgery   Right foot drop    Shortness of breath    with Activity   Sleep apnea    "study shows not bad enough for CPAP.", pt denies   Umbilical hernia    Past Surgical History:  Procedure Laterality Date   BACK SURGERY     for lumbar disc disease X2   BRAIN SURGERY  1970   Tumor removed- has steel plate   BRAIN SURGERY      Plating due to soft spot closing too early- age 39   BREAST LUMPECTOMY WITH NEEDLE LOCALIZATION Right 08/23/2013   Procedure: RIGHT BREAST LUMPECTOMY WITH NEEDLE LOCALIZATION;  Surgeon: Odis Hollingshead, MD;  Location: [facility];  Service: General;  Laterality: Right;   CHOLECYSTECTOMY  3614   EXCISION UMBILICAL NODULE N/A 4/31/5400   Procedure: EXCISION OF UMBILICUS;  Surgeon: Coralie Keens, MD;  [facility];  Service: General;  Laterality: N/A;   EYE SURGERY      eye lift   I & D EXTREMITY  04/02/2011   Procedure: IRRIGATION AND DEBRIDEMENT EXTREMITY;  Surgeon: Mcarthur Rossetti;  Location: [facility];  Service: Orthopedics;  Laterality: Right;  I&D right heel ulcer, placement of A-cell graft   INCISION AND DRAINAGE ABSCESS N/A 10/28/2017   Procedure: INCISION AND DRAINAGE UMBILICAL HERNIA ABSCESS;  Surgeon: Ralene Ok, MD;  Location: [facility];  Service: General;  Laterality: N/A;   INCISION AND DRAINAGE OF WOUND  08/29/2011   Procedure: IRRIGATION AND DEBRIDEMENT WOUND;  Surgeon: Theodoro Kos, DO;  Location: 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   AKI (acute kidney injury) ([place]) 03/07/2020   Seizures ([place]) 03/07/2020   Acute lower UTI 03/07/2020   Tremor 03/07/2020   History of 2019 novel coronavirus disease (COVID-19) 03/25/2019   Depression with anxiety 03/25/2019   Chest pain 03/24/2019   Postoperative abscess 18/56/3149   Umbilical hernia, incarcerated, s/p repair 10/16/2017 10/16/2017   Chronic pain of both knees 04/23/2017   Unilateral primary osteoarthritis, left knee 10/24/2016   Unilateral primary osteoarthritis, right knee 10/24/2016   Abnormal mammogram with microcalcification-Right inner lower quadrant 07/21/2013   Oliguria and anuria 09/02/2012   Constipation 03/02/2011   Lethargy 03/01/2011   Heel ulcer due to DM ([place]) 02/19/2011   Wound, open, hip or  thigh 02/19/2011   Healing pressure ulcer stage III ([place]) 02/18/2011   Morbidly obese ([place]) 02/18/2011   Diabetes mellitus type 2 with complications, uncontrolled ([place]) 02/18/2011   Sleep apnea 02/18/2011   Hypothyroidism 02/18/2011   Chronic pain 02/18/2011   Acute lymphangitis 02/18/2011   Cellulitis of foot 02/18/2011    PCP: Pecolia Ades, NP  REFERRING PROVIDER: Mcarthur Rossetti, MD   REFERRING DIAG: [phone] (ICD-10-CM) - Chronic pain of left knee M25.561,G89.29 (ICD-10-CM) - Chronic pain of right knee   THERAPY DIAG:  No diagnosis found.  ONSET DATE: ***  SUBJECTIVE:   SUBJECTIVE STATEMENT: ***  PERTINENT HISTORY:  depression, anxiety, seizures, chronic pain, insulin-dependent diabetes mellitus, and hypertension  PAIN:  Are you having pain? {yes/no:20286} VAS scale: ***/10 Pain location: *** Pain orientation: {Pain Orientation:25161}  PAIN TYPE: {type:313116} Pain description: {PAIN DESCRIPTION:21022940}  Aggravating factors: *** Relieving factors: ***  PRECAUTIONS: {Therapy precautions:24002}  WEIGHT BEARING RESTRICTIONS {Yes ***/No:24003}  FALLS:  Has patient fallen in last 6 months? {yes/no:20286}, Number of falls: ***  LIVING ENVIRONMENT: Lives with: {[facility] lives with:25569::"lives with their family"} Lives in: {Lives in:25570} Stairs: {yes/no:20286}; {Stairs:24000} Has following equipment at home: {Assistive devices:23999}  OCCUPATION: ***  PLOF: {PLOF:24004}  PATIENT GOALS ***   OBJECTIVE:   DIAGNOSTIC FINDINGS:   2 views of the left knee show profound end-stage arthritis with no joint  space remaining throughout the knee.  There are large periarticular  osteophytes in all 3 compartments.   2 views of the right knee show severe tricompartmental arthritis with  complete joint space loss in all 3 compartments.  There are large  para-articular osteophytes in all 3 compartments.  PATIENT SURVEYS:  FOTO  *** D/C MCII  COGNITION:  Overall cognitive status: {cognition:24006}     SENSATION:  Light touch: {intact/deficits:24005}  Stereognosis: {intact/deficits:24005}  Hot/Cold: {intact/deficits:24005}  Proprioception: {intact/deficits:24005}  MUSCLE LENGTH: Hamstrings: Right *** deg; Left *** deg Thomas test: Right *** deg; Left *** deg  POSTURE:  ***  LE AROM/PROM:  A/PROM Right 02/26/2021 Left 02/26/2021  Hip flexion    Hip extension    Hip abduction    Hip adduction    Hip internal rotation    Hip external rotation    Knee flexion    Knee extension    Ankle dorsiflexion    Ankle plantarflexion    Ankle inversion    Ankle eversion     (Blank rows = not tested)  LE MMT:  MMT Right 02/26/2021 Left 02/26/2021  Hip flexion    Hip extension    Hip abduction    Hip adduction    Hip internal rotation    Hip external rotation  Knee flexion    Knee extension    Ankle dorsiflexion    Ankle plantarflexion    Ankle inversion    Ankle eversion     (Blank rows = not tested)  LOWER EXTREMITY SPECIAL TESTS:  {LEspecialtests:26242}  JOINT MOBILITY ASSESSMENT:  ***  FUNCTIONAL TESTS:  {Functional tests:24029}  GAIT: Distance walked: *** Assistive device utilized: {Assistive devices:23999} Level of assistance: {Levels of assistance:24026} Comments: ***    TODAY'S TREATMENT: ***   PATIENT EDUCATION:  Education details: MOI, diagnosis, prognosis, anatomy, exercise progression, DOMS expectations, muscle firing,  envelope of function, HEP, POC  Person educated: Patient Education method: Explanation, Demonstration, Tactile cues, Verbal cues, and Handouts Education comprehension: verbalized understanding, returned demonstration, verbal cues required, and tactile cues required   HOME EXERCISE PROGRAM: ***  ASSESSMENT:  CLINICAL IMPRESSION: Patient is a 50 y.o. female who was seen today for physical therapy evaluation and treatment for cc of bilateral  knee pain. Objective impairments include Abnormal gait, decreased activity tolerance, decreased balance, decreased endurance, decreased mobility, difficulty walking, decreased ROM, decreased strength, hypomobility, increased muscle spasms, impaired flexibility, improper body mechanics, and postural dysfunction. These impairments are limiting patient from cleaning, community activity, driving, meal prep, laundry, yard work, shopping, and exercise . Personal factors including Age, Behavior pattern, Fitness, Past/current experiences, Time since onset of injury/illness/exacerbation, and 3+ comorbidities:    are also affecting patient's functional outcome. Patient will benefit from skilled PT to address above impairments and improve overall function.  REHAB POTENTIAL: Fair    CLINICAL DECISION MAKING: Evolving/moderate complexity  EVALUATION COMPLEXITY: Moderate   GOALS:   SHORT TERM GOALS:  STG Name Target Date Goal status  1 Pt will become independent with HEP in order to demonstrate synthesis of PT education.  03/12/2021 INITIAL  2 Pt will score at least *** pt increase on FOTO to demonstrate functional improvement in MCII and pt perceived function.    04/09/2021 INITIAL  3 Pt will report at least 2 pt reduction on NPRS scale for pain in order to demonstrate functional improvement with household activity, self care, and ADL.   04/09/2021 INITIAL  4 Pt will be able to demonstrate ___ in order to demonstrate functional improvement in UE/LE function for self-care and house hold duties.   04/09/2021 INITIAL   LONG TERM GOALS:   LTG Name Target Date Goal status  1 Pt  will become independent with final HEP in order to demonstrate synthesis of PT education.  05/21/2021 INITIAL  2 Pt will be able to demonstrate/report ability to walk >___ mins without pain in order to demonstrate functional improvement and tolerance to exercise and community mobility.   05/21/2021 INITIAL  3 Pt will be able to  perform 5XSTS in under 12s  in order to demonstrate functional improvement above the cut off score for adults.    05/21/2021 INITIAL  4 Pt will score >/= *** on FOTO to demonstrate functional improvement in bilat knee pain.   05/21/2021 INITIAL   PLAN: PT FREQUENCY: 1-2x/week  PT DURATION: 12 weeks (likely D/C by ***)  PLANNED INTERVENTIONS: Therapeutic exercises, Therapeutic activity, Neuro Muscular re-education, Balance training, Gait training, Patient/Family education, Joint mobilization, Stair training, Prosthetic training, DME instructions, Aquatic Therapy, Dry Needling, Electrical stimulation, Spinal mobilization, Cryotherapy, Moist heat, scar mobilization, Taping, Vasopneumatic device, Traction, Ultrasound, Ionotophoresis 4mg /ml Dexamethasone, and Manual therapy  PLAN FOR NEXT SESSION: review HEP, ***  Daleen Bo PT, DPT 02/26/21 3:45 PM

## 2021-02-27 ENCOUNTER — Ambulatory Visit (HOSPITAL_BASED_OUTPATIENT_CLINIC_OR_DEPARTMENT_OTHER): Payer: Medicare HMO | Admitting: Physical Therapy

## 2021-03-08 ENCOUNTER — Ambulatory Visit: Payer: Medicare HMO | Admitting: Orthopedic Surgery

## 2021-03-08 ENCOUNTER — Ambulatory Visit (INDEPENDENT_AMBULATORY_CARE_PROVIDER_SITE_OTHER): Payer: Medicare HMO | Admitting: Orthopaedic Surgery

## 2021-03-08 DIAGNOSIS — M170 Bilateral primary osteoarthritis of knee: Secondary | ICD-10-CM | POA: Diagnosis not present

## 2021-03-08 DIAGNOSIS — M25561 Pain in right knee: Secondary | ICD-10-CM

## 2021-03-08 DIAGNOSIS — M1711 Unilateral primary osteoarthritis, right knee: Secondary | ICD-10-CM

## 2021-03-08 DIAGNOSIS — G8929 Other chronic pain: Secondary | ICD-10-CM | POA: Diagnosis not present

## 2021-03-08 DIAGNOSIS — M25562 Pain in left knee: Secondary | ICD-10-CM

## 2021-03-08 DIAGNOSIS — M1712 Unilateral primary osteoarthritis, left knee: Secondary | ICD-10-CM

## 2021-03-08 MED ORDER — LIDOCAINE HCL 1 % IJ SOLN
3.0000 mL | INTRAMUSCULAR | Status: AC | PRN
  Administered 2021-03-08: 3 mL

## 2021-03-08 MED ORDER — METHYLPREDNISOLONE ACETATE 40 MG/ML IJ SUSP
40.0000 mg | INTRAMUSCULAR | Status: AC | PRN
  Administered 2021-03-08: 40 mg via INTRA_ARTICULAR

## 2021-03-08 NOTE — Progress Notes (Signed)
Office Visit Note   Patient: [name]           Date of Birth: 1971/02/05           MRN: [id number] Visit Date: 03/08/2021              Requested by: Pecolia Ades, NP No address on file PCP: Pecolia Ades, NP   Assessment & Plan: Visit Diagnoses:  1. Chronic pain of left knee   2. Chronic pain of right knee   3. Unilateral primary osteoarthritis, right knee   4. Unilateral primary osteoarthritis, left knee     Plan: Per the patient's request I did place a steroid injection of both knees today.  She understands that she really needs to watch her blood glucose closely.  I can repeat these in 3 months.  Follow-Up Instructions: Return in about 3 months (around 06/06/2021).   Orders:  Orders Placed This Encounter  Procedures   Large Joint Inj   Large Joint Inj   No orders of the defined types were placed in this encounter.     Procedures: Large Joint Inj: R knee on 03/08/2021 3:46 PM Indications: diagnostic evaluation and pain Details: 22 G 1.5 in needle, superolateral approach  Arthrogram: No  Medications: 3 mL lidocaine 1 %; 40 mg methylPREDNISolone acetate 40 MG/ML Outcome: tolerated well, no immediate complications Procedure, treatment alternatives, risks and benefits explained, specific risks discussed. Consent was given by the patient. Immediately prior to procedure a time out was called to verify the correct patient, procedure, equipment, support staff and site/side marked as required. Patient was prepped and draped in the usual sterile fashion.    Large Joint Inj: L knee on 03/08/2021 3:46 PM Indications: diagnostic evaluation and pain Details: 22 G 1.5 in needle, superolateral approach  Arthrogram: No  Medications: 3 mL lidocaine 1 %; 40 mg methylPREDNISolone acetate 40 MG/ML Outcome: tolerated well, no immediate complications Procedure, treatment alternatives, risks and benefits explained, specific risks discussed. Consent was given by  the patient. Immediately prior to procedure a time out was called to verify the correct patient, procedure, equipment, support staff and site/side marked as required. Patient was prepped and draped in the usual sterile fashion.      Clinical Data: No additional findings.   Subjective: Chief Complaint  Patient presents with   Left Knee - Pain   Right Knee - Pain  The patient is well-known to me.  She comes in every 3 months for steroid injections in both knees due to severe end-stage arthritis and severe knee pain.  She is not a surgical candidate.  Her BMI recently is 51.  She has had significant kidney issues and is a poorly controlled diabetic.  She understands that steroid injections can contribute to her blood glucose being high.  She has just started new blood glucose medications recently.  HPI  Review of Systems   Objective: Vital Signs: There were no vitals taken for this visit.  Physical Exam She is alert and orient x3 and in no acute distress Ortho Exam Both knees have pain globally with patellofemoral crepitation and a large soft tissue envelope around both knees. Specialty Comments:  No specialty comments available.  Imaging: No results found.   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   AKI (  acute kidney injury) ([place]) 03/07/2020   Seizures ([place]) 03/07/2020   Acute lower UTI 03/07/2020   Tremor 03/07/2020   History of 2019 novel coronavirus disease (COVID-19) 03/25/2019   Depression with anxiety 03/25/2019   Chest pain 03/24/2019   Postoperative abscess 85/46/2703   Umbilical hernia, incarcerated, s/p repair 10/16/2017 10/16/2017   Chronic pain of both knees 04/23/2017   Unilateral primary osteoarthritis, left knee 10/24/2016   Unilateral primary osteoarthritis, right knee 10/24/2016    Abnormal mammogram with microcalcification-Right inner lower quadrant 07/21/2013   Oliguria and anuria 09/02/2012   Constipation 03/02/2011   Lethargy 03/01/2011   Heel ulcer due to DM ([place]) 02/19/2011   Wound, open, hip or thigh 02/19/2011   Healing pressure ulcer stage III ([place]) 02/18/2011   Morbidly obese ([place]) 02/18/2011   Diabetes mellitus type 2 with complications, uncontrolled ([place]) 02/18/2011   Sleep apnea 02/18/2011   Hypothyroidism 02/18/2011   Chronic pain 02/18/2011   Acute lymphangitis 02/18/2011   Cellulitis of foot 02/18/2011   Past Medical History:  Diagnosis Date   Acute renal failure (ARF) ([place]) 09/02/2012   Anemia    Anxiety    panic attacks   Back pain    Blood transfusion    Chronic heel ulcer ([place])    Chronic kidney disease    Chronic pain syndrome    Depression    Diabetes mellitus    type II    DJD (degenerative joint disease)    Dysrhythmia    pt unsure what this was   Fibromyalgia    GERD (gastroesophageal reflux disease)    H/O: Bell's palsy 2011   Heart murmur    "slight one"   History of kidney stones    History of MRSA infection OF ULCER   Hypertension    no longer taken since 10-11 months per patient at preop phone call of 10/13/2017    Hypothyroidism    Hypoventilation associated with obesity syndrome ([place])    Migraine    Non-healing non-surgical wound    Right hip, has Wound vac to hip.  Started as a skin tear.   Peripheral vascular disease ([facility])    PONV (postoperative nausea and vomiting)    can be slow to wake up after surgery   Right foot drop    Shortness of breath    with Activity   Sleep apnea    "study shows not bad enough for CPAP.", pt denies   Umbilical hernia     Family History  Problem Relation Age of Onset   Diabetes type II Father    Heart attack Father    Peripheral vascular disease Father    Diabetes type II Mother    Anesthesia problems Mother    Heart attack Mother    Peripheral vascular disease Mother     Hypertension Mother     Past Surgical History:  Procedure Laterality Date   BACK SURGERY     for lumbar disc disease [place]   Tumor removed- has steel plate   BRAIN SURGERY      Plating due to soft spot closing too early- age 27   BREAST LUMPECTOMY WITH NEEDLE LOCALIZATION Right 08/23/2013   Procedure: RIGHT BREAST LUMPECTOMY WITH NEEDLE LOCALIZATION;  Surgeon: Odis Hollingshead, MD;  Location: [facility];  Service: General;  Laterality: Right;   CHOLECYSTECTOMY  5009   EXCISION UMBILICAL NODULE N/A 3/81/8299   Procedure: EXCISION OF UMBILICUS;  Surgeon: Coralie Keens, MD;  Location:  [facility];  Service: General;  Laterality: N/A;   EYE SURGERY     eye lift   I & D EXTREMITY  04/02/2011   Procedure: IRRIGATION AND DEBRIDEMENT EXTREMITY;  Surgeon: Mcarthur Rossetti;  Location: [facility];  Service: Orthopedics;  Laterality: Right;  I&D right heel ulcer, placement of A-cell graft   INCISION AND DRAINAGE ABSCESS N/A 10/28/2017   Procedure: INCISION AND DRAINAGE UMBILICAL HERNIA ABSCESS;  Surgeon: Ralene Ok, MD;  Location: [facility];  Service: General;  Laterality: N/A;   INCISION AND DRAINAGE OF WOUND  08/29/2011   Procedure: IRRIGATION AND DEBRIDEMENT WOUND;  Surgeon: Theodoro Kos, DO;  Location: [facility];  Service: Plastics;  Laterality: Right;   INSERTION OF MESH N/A 10/16/2017   Procedure: INSERTION OF MESH;  Surgeon: Coralie Keens, MD;  Location: [facility];  Service: General;  Laterality: N/A;   LITHOTRIPSY     2007ish   right elbow     UMBILICAL HERNIA REPAIR N/A 10/16/2017   Procedure: UMBILICAL HERNIA REPAIR WITH MESH;  Surgeon: Coralie Keens, MD;  Location: [facility];  Service: General;  Laterality: N/A;   Social History   Occupational History   Not on file  Tobacco Use   Smoking status: Never   Smokeless tobacco: Never  Vaping Use   Vaping Use: Never used  Substance and Sexual Activity   Alcohol use: No   Drug use: No   Sexual activity: Never

## 2021-03-15 ENCOUNTER — Ambulatory Visit (HOSPITAL_BASED_OUTPATIENT_CLINIC_OR_DEPARTMENT_OTHER): Payer: Medicare HMO | Admitting: Physical Therapy

## 2021-04-05 ENCOUNTER — Ambulatory Visit (HOSPITAL_BASED_OUTPATIENT_CLINIC_OR_DEPARTMENT_OTHER): Payer: Medicare HMO | Admitting: Physical Therapy

## 2021-04-26 ENCOUNTER — Telehealth: Payer: Self-pay | Admitting: Orthopaedic Surgery

## 2021-04-26 NOTE — Telephone Encounter (Signed)
Pt called. She states she needs to talk to [facility] about physical therapy.   CB [phone]

## 2021-05-14 ENCOUNTER — Inpatient Hospital Stay (HOSPITAL_BASED_OUTPATIENT_CLINIC_OR_DEPARTMENT_OTHER)
Admission: EM | Admit: 2021-05-14 | Discharge: 2021-05-17 | DRG: 683 | Disposition: A | Discharge status: Home / Self Care | Payer: Medicare HMO | Attending: Internal Medicine | Admitting: Internal Medicine

## 2021-05-14 ENCOUNTER — Other Ambulatory Visit: Payer: Self-pay

## 2021-05-14 ENCOUNTER — Encounter (HOSPITAL_BASED_OUTPATIENT_CLINIC_OR_DEPARTMENT_OTHER): Payer: Self-pay | Admitting: *Deleted

## 2021-05-14 ENCOUNTER — Emergency Department (HOSPITAL_BASED_OUTPATIENT_CLINIC_OR_DEPARTMENT_OTHER): Payer: Medicare HMO

## 2021-05-14 DIAGNOSIS — I10 Essential (primary) hypertension: Secondary | ICD-10-CM | POA: Diagnosis present

## 2021-05-14 DIAGNOSIS — Z79891 Long term (current) use of opiate analgesic: Secondary | ICD-10-CM | POA: Diagnosis not present

## 2021-05-14 DIAGNOSIS — R338 Other retention of urine: Secondary | ICD-10-CM | POA: Diagnosis not present

## 2021-05-14 DIAGNOSIS — G894 Chronic pain syndrome: Secondary | ICD-10-CM | POA: Diagnosis present

## 2021-05-14 DIAGNOSIS — M25562 Pain in left knee: Secondary | ICD-10-CM | POA: Diagnosis present

## 2021-05-14 DIAGNOSIS — R5381 Other malaise: Secondary | ICD-10-CM

## 2021-05-14 DIAGNOSIS — Z881 Allergy status to other antibiotic agents status: Secondary | ICD-10-CM | POA: Diagnosis not present

## 2021-05-14 DIAGNOSIS — G40909 Epilepsy, unspecified, not intractable, without status epilepticus: Secondary | ICD-10-CM | POA: Diagnosis present

## 2021-05-14 DIAGNOSIS — Z6841 Body Mass Index (BMI) 40.0 and over, adult: Secondary | ICD-10-CM | POA: Diagnosis not present

## 2021-05-14 DIAGNOSIS — R339 Retention of urine, unspecified: Secondary | ICD-10-CM

## 2021-05-14 DIAGNOSIS — R300 Dysuria: Secondary | ICD-10-CM | POA: Diagnosis present

## 2021-05-14 DIAGNOSIS — Z794 Long term (current) use of insulin: Secondary | ICD-10-CM

## 2021-05-14 DIAGNOSIS — E875 Hyperkalemia: Secondary | ICD-10-CM | POA: Diagnosis not present

## 2021-05-14 DIAGNOSIS — Z8616 Personal history of COVID-19: Secondary | ICD-10-CM

## 2021-05-14 DIAGNOSIS — E1151 Type 2 diabetes mellitus with diabetic peripheral angiopathy without gangrene: Secondary | ICD-10-CM | POA: Diagnosis present

## 2021-05-14 DIAGNOSIS — E039 Hypothyroidism, unspecified: Secondary | ICD-10-CM | POA: Diagnosis present

## 2021-05-14 DIAGNOSIS — M797 Fibromyalgia: Secondary | ICD-10-CM | POA: Diagnosis present

## 2021-05-14 DIAGNOSIS — G8929 Other chronic pain: Secondary | ICD-10-CM | POA: Diagnosis present

## 2021-05-14 DIAGNOSIS — Z833 Family history of diabetes mellitus: Secondary | ICD-10-CM

## 2021-05-14 DIAGNOSIS — Z20822 Contact with and (suspected) exposure to covid-19: Secondary | ICD-10-CM | POA: Diagnosis present

## 2021-05-14 DIAGNOSIS — F32A Depression, unspecified: Secondary | ICD-10-CM | POA: Diagnosis present

## 2021-05-14 DIAGNOSIS — Z87442 Personal history of urinary calculi: Secondary | ICD-10-CM

## 2021-05-14 DIAGNOSIS — Z7984 Long term (current) use of oral hypoglycemic drugs: Secondary | ICD-10-CM

## 2021-05-14 DIAGNOSIS — M545 Low back pain, unspecified: Secondary | ICD-10-CM | POA: Diagnosis present

## 2021-05-14 DIAGNOSIS — Z880 Allergy status to penicillin: Secondary | ICD-10-CM

## 2021-05-14 DIAGNOSIS — M25561 Pain in right knee: Secondary | ICD-10-CM | POA: Diagnosis present

## 2021-05-14 DIAGNOSIS — G4730 Sleep apnea, unspecified: Secondary | ICD-10-CM | POA: Diagnosis present

## 2021-05-14 DIAGNOSIS — R569 Unspecified convulsions: Secondary | ICD-10-CM

## 2021-05-14 DIAGNOSIS — N179 Acute kidney failure, unspecified: Secondary | ICD-10-CM | POA: Diagnosis present

## 2021-05-14 DIAGNOSIS — R34 Anuria and oliguria: Secondary | ICD-10-CM | POA: Diagnosis present

## 2021-05-14 DIAGNOSIS — I959 Hypotension, unspecified: Secondary | ICD-10-CM | POA: Diagnosis present

## 2021-05-14 DIAGNOSIS — E1165 Type 2 diabetes mellitus with hyperglycemia: Secondary | ICD-10-CM

## 2021-05-14 DIAGNOSIS — Z8249 Family history of ischemic heart disease and other diseases of the circulatory system: Secondary | ICD-10-CM

## 2021-05-14 DIAGNOSIS — Z882 Allergy status to sulfonamides status: Secondary | ICD-10-CM

## 2021-05-14 DIAGNOSIS — Z79899 Other long term (current) drug therapy: Secondary | ICD-10-CM

## 2021-05-14 DIAGNOSIS — E871 Hypo-osmolality and hyponatremia: Secondary | ICD-10-CM | POA: Diagnosis present

## 2021-05-14 DIAGNOSIS — E119 Type 2 diabetes mellitus without complications: Secondary | ICD-10-CM | POA: Diagnosis not present

## 2021-05-14 DIAGNOSIS — Z888 Allergy status to other drugs, medicaments and biological substances status: Secondary | ICD-10-CM

## 2021-05-14 DIAGNOSIS — Z8614 Personal history of Methicillin resistant Staphylococcus aureus infection: Secondary | ICD-10-CM

## 2021-05-14 DIAGNOSIS — K219 Gastro-esophageal reflux disease without esophagitis: Secondary | ICD-10-CM | POA: Diagnosis present

## 2021-05-14 DIAGNOSIS — Z9049 Acquired absence of other specified parts of digestive tract: Secondary | ICD-10-CM

## 2021-05-14 DIAGNOSIS — Z79890 Hormone replacement therapy (postmenopausal): Secondary | ICD-10-CM

## 2021-05-14 DIAGNOSIS — Z7982 Long term (current) use of aspirin: Secondary | ICD-10-CM

## 2021-05-14 LAB — URINALYSIS, ROUTINE W REFLEX MICROSCOPIC
Glucose, UA: NEGATIVE mg/dL
Hgb urine dipstick: NEGATIVE
Ketones, ur: 15 mg/dL — AB
Leukocytes,Ua: NEGATIVE
Nitrite: NEGATIVE
Protein, ur: NEGATIVE mg/dL
Specific Gravity, Urine: >1.03 — ABNORMAL HIGH (ref 1.005–1.030)
pH: 5 (ref 5.0–8.0)

## 2021-05-14 LAB — COMPREHENSIVE METABOLIC PANEL
ALT: 36 U/L (ref 0–44)
AST: 49 U/L — ABNORMAL HIGH (ref 15–41)
Albumin: 3.8 g/dL (ref 3.5–5.0)
Alkaline Phosphatase: 129 U/L — ABNORMAL HIGH (ref 38–126)
Anion gap: 11 (ref 5–15)
BUN: 43 mg/dL — ABNORMAL HIGH (ref 6–20)
CO2: 20 mmol/L — ABNORMAL LOW (ref 22–32)
Calcium: 9 mg/dL (ref 8.9–10.3)
Chloride: 100 mmol/L (ref 98–111)
Creatinine, Ser: 3.99 mg/dL — ABNORMAL HIGH (ref 0.44–1.00)
GFR, Estimated: 13 mL/min — ABNORMAL LOW (ref >60)
Glucose, Bld: 376 mg/dL — ABNORMAL HIGH (ref 70–99)
Potassium: 4.4 mmol/L (ref 3.5–5.1)
Sodium: 131 mmol/L — ABNORMAL LOW (ref 135–145)
Total Bilirubin: 0.5 mg/dL (ref 0.3–1.2)
Total Protein: 7.4 g/dL (ref 6.5–8.1)

## 2021-05-14 LAB — CBC WITH DIFFERENTIAL/PLATELET
Abs Immature Granulocytes: 0.03 10*3/uL (ref 0.00–0.07)
Basophils Absolute: 0 10*3/uL (ref 0.0–0.1)
Basophils Relative: 0 %
Eosinophils Absolute: 0 10*3/uL (ref 0.0–0.5)
Eosinophils Relative: 0 %
HCT: 44.3 % (ref 36.0–46.0)
Hemoglobin: 14.6 g/dL (ref 12.0–15.0)
Immature Granulocytes: 0 %
Lymphocytes Relative: 45 %
Lymphs Abs: 4.7 10*3/uL — ABNORMAL HIGH (ref 0.7–4.0)
MCH: 30.7 pg (ref 26.0–34.0)
MCHC: 33 g/dL (ref 30.0–36.0)
MCV: 93.3 fL (ref 80.0–100.0)
Monocytes Absolute: 0.9 10*3/uL (ref 0.1–1.0)
Monocytes Relative: 8 %
Neutro Abs: 4.9 10*3/uL (ref 1.7–7.7)
Neutrophils Relative %: 47 %
Platelets: 216 10*3/uL (ref 150–400)
RBC: 4.75 MIL/uL (ref 3.87–5.11)
RDW: 13.4 % (ref 11.5–15.5)
WBC: 10.5 10*3/uL (ref 4.0–10.5)
nRBC: 0 % (ref 0.0–0.2)

## 2021-05-14 LAB — RESP PANEL BY RT-PCR (FLU A&B, COVID) ARPGX2
Influenza A by PCR: NEGATIVE
Influenza B by PCR: NEGATIVE
SARS Coronavirus 2 by RT PCR: NEGATIVE

## 2021-05-14 LAB — HCG, SERUM, QUALITATIVE: Preg, Serum: NEGATIVE

## 2021-05-14 LAB — CBG MONITORING, ED: Glucose-Capillary: 312 mg/dL — ABNORMAL HIGH (ref 70–99)

## 2021-05-14 LAB — LIPASE, BLOOD: Lipase: 38 U/L (ref 11–51)

## 2021-05-14 LAB — GLUCOSE, CAPILLARY: Glucose-Capillary: 254 mg/dL — ABNORMAL HIGH (ref 70–99)

## 2021-05-14 IMAGING — CT CT RENAL STONE PROTOCOL
2 of 7 series · 14 of 46 positions shown, 18 images · non-contrast
Comparison: CT [DATE]

CLINICAL DATA: Bilateral flank pain



[Series 2: axial st · axial · 0.98mm/px · z∈[-581,-86]mm · 11 of 113 slices shown, 15 images]
[im 7/113  soft-tissue]
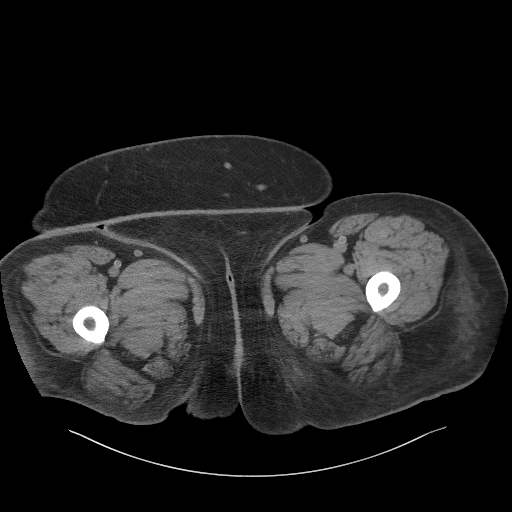
[im 7/113  bone]
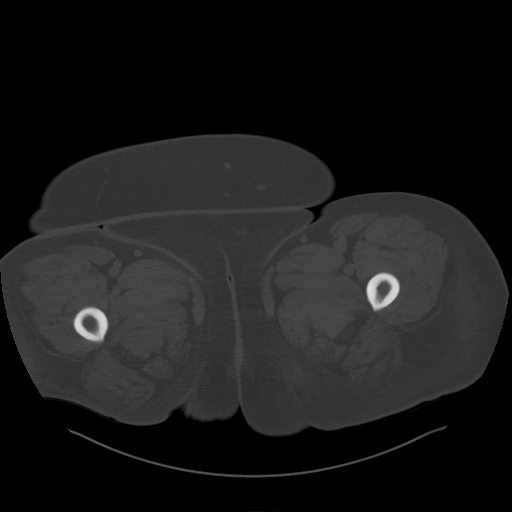
[im 19/113  soft-tissue]
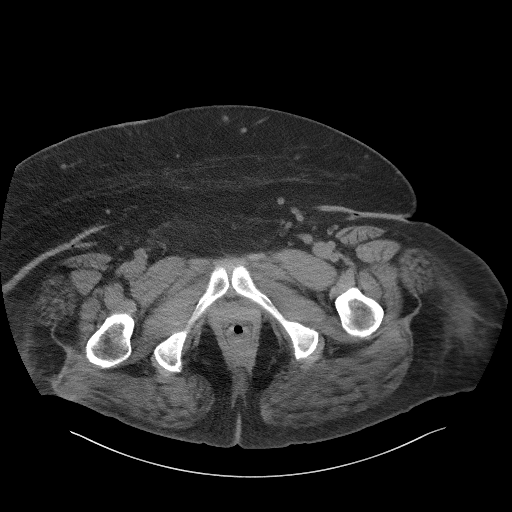
[im 32/113  soft-tissue]
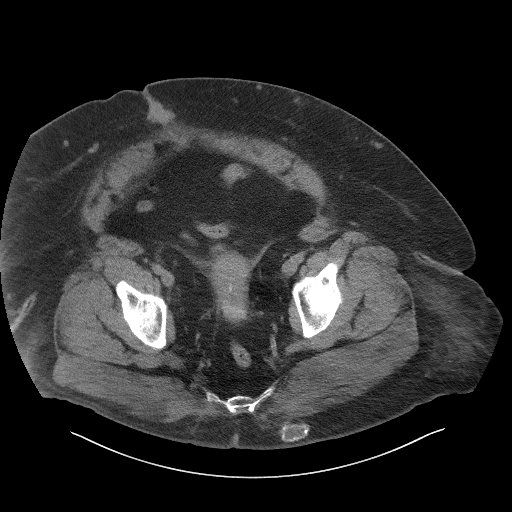
[im 44/113  soft-tissue]
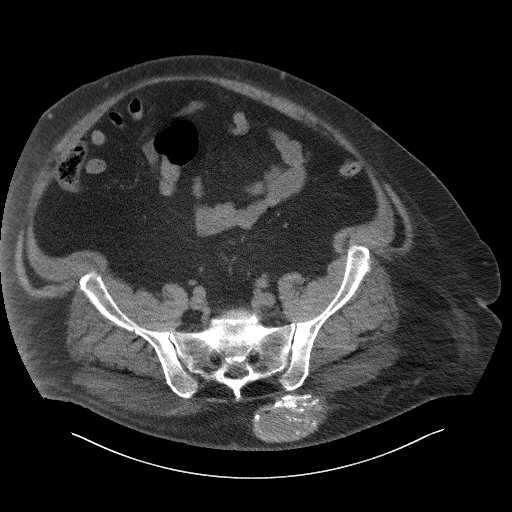
[im 57/113  soft-tissue]
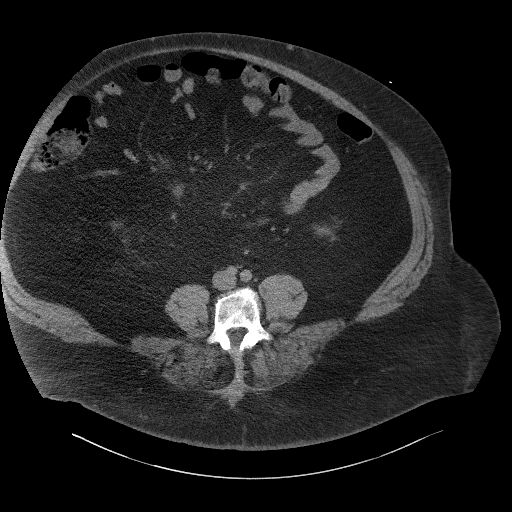
[im 69/113  soft-tissue]
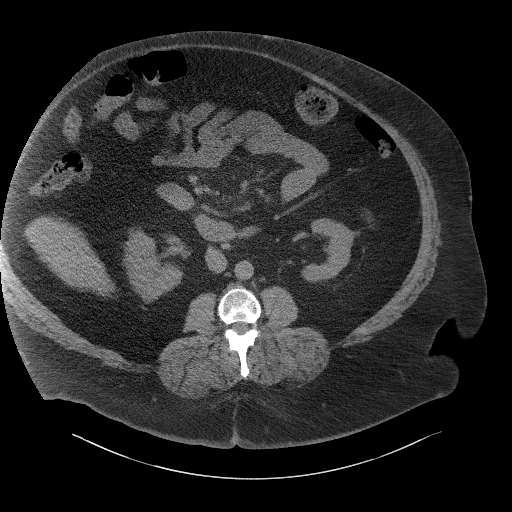
[im 81/113  soft-tissue]
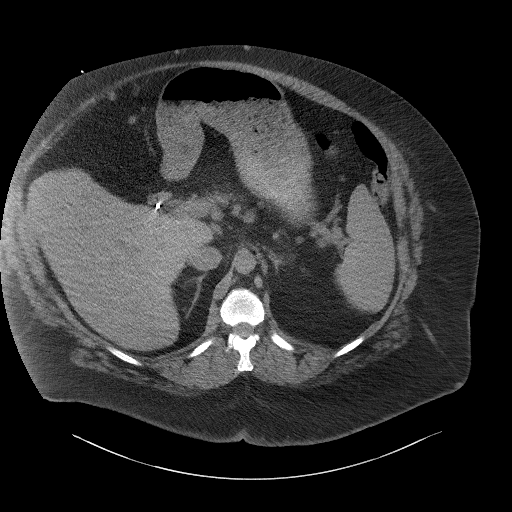
[im 88/113  lung]
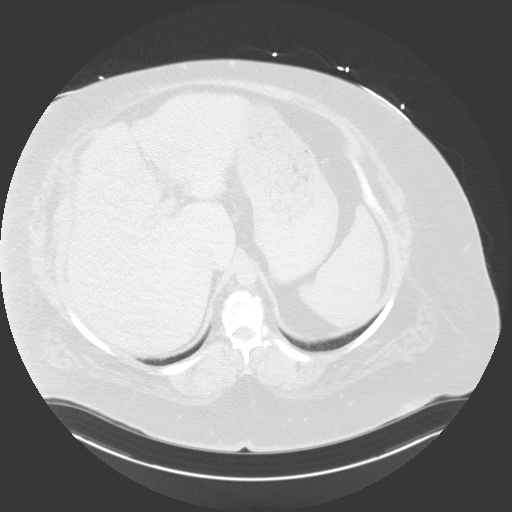
[im 94/113  soft-tissue]
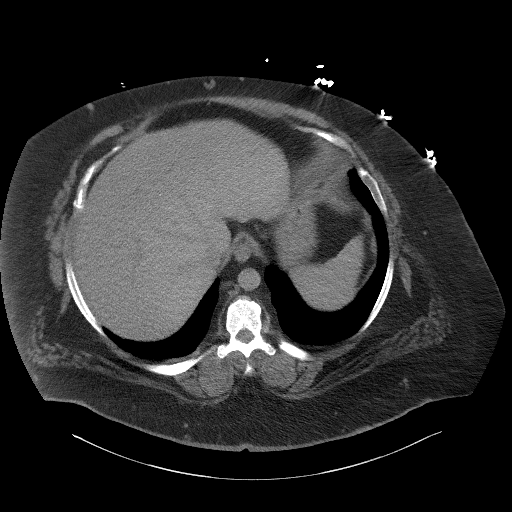
[im 94/113  lung]
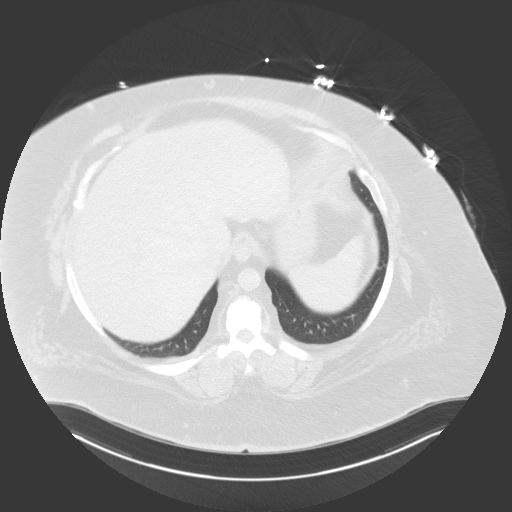
[im 100/113  lung]
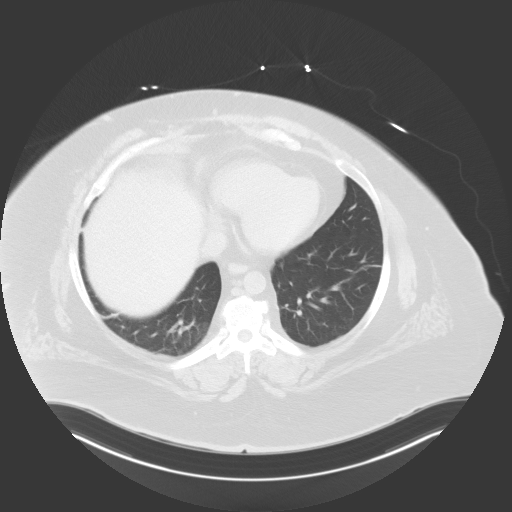
[im 106/113  soft-tissue]
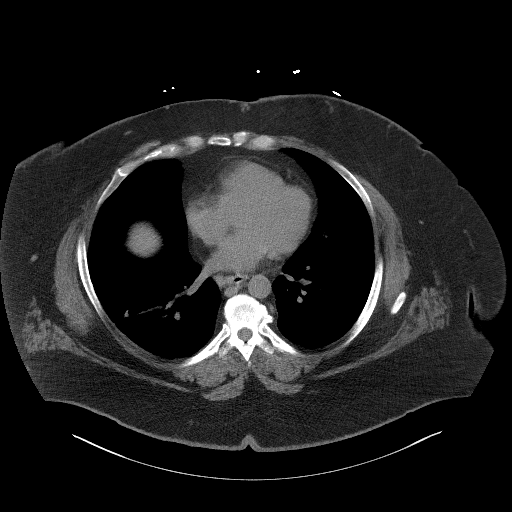
[im 106/113  lung]
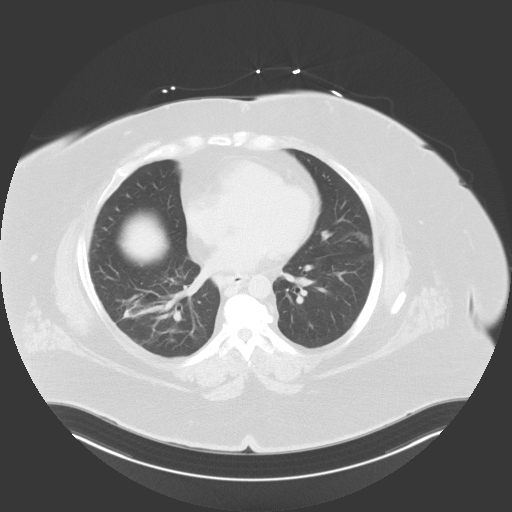
[im 106/113  bone]
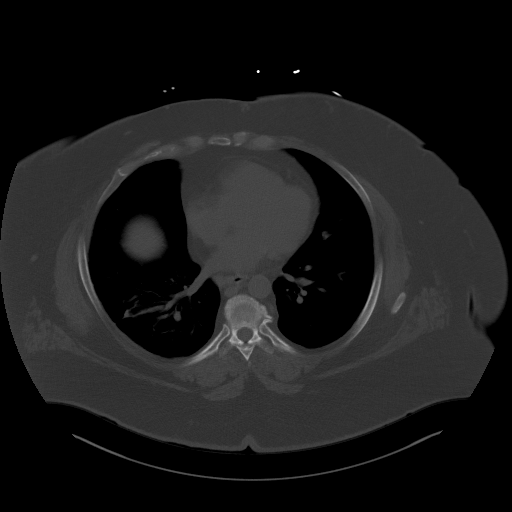

[Series 5: coronal st · coronal · 1.09mm/px · 3 of 138 slices shown]
[im 35/138  soft-tissue]
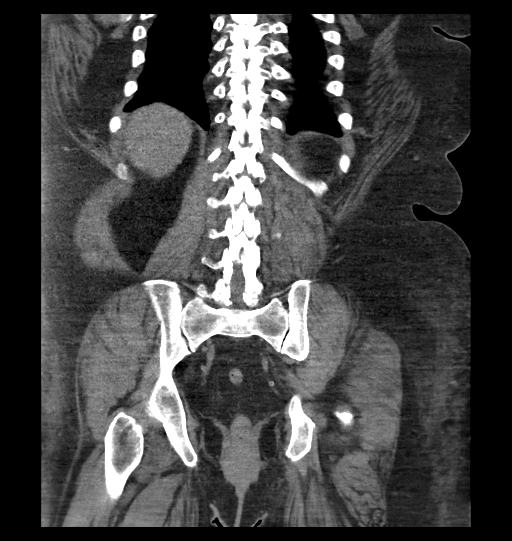
[im 69/138  soft-tissue]
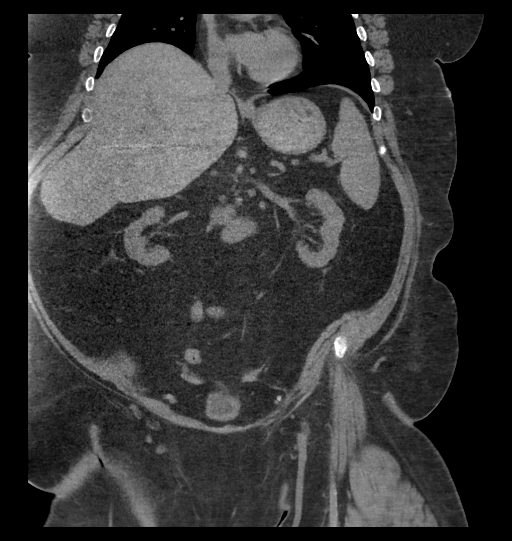
[im 103/138  soft-tissue]
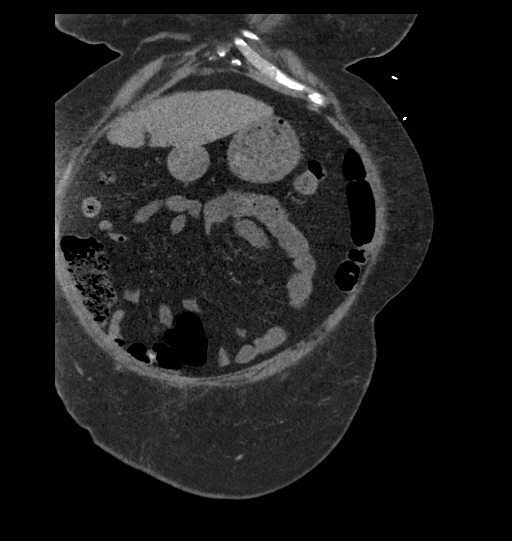

[14 of 46 positions shown; findings below may reference images not displayed]

FINDINGS: Lower chest: Mild bibasilar atelectasis.  Heart size is normal.

Hepatobiliary: No focal liver abnormality is seen. Status post
cholecystectomy. No biliary dilatation.

Pancreas: Unremarkable. No pancreatic ductal dilatation or
surrounding inflammatory changes.

Spleen: Normal in size without focal abnormality.

Adrenals/Urinary Tract: Adrenal glands are unremarkable. Kidneys are
normal, without renal calculi, focal lesion, or hydronephrosis.
Bladder is unremarkable for the degree of distension.

Stomach/Bowel: Stomach is within normal limits. No evidence of bowel
wall thickening, distention, or inflammatory changes.

Vascular/Lymphatic: No significant vascular findings are present.
Multiple mildly prominent upper abdominal lymph nodes, stable from
prior. No enlarged pelvic lymph nodes.

Reproductive: IUD within the uterus. Uterus and adnexal regions are
otherwise unremarkable.

Other: No free fluid. No abdominopelvic fluid collection. No
pneumoperitoneum. No abdominal wall hernia.

Musculoskeletal: Redemonstration of mixed attenuation partially
calcified mass within the subcutaneous soft tissues at the medial
aspect of the left gluteal region. This is stable in size and
appearance compared to multiple previous studies and is most
compatible with a benign entity such as fat necrosis. No acute bony
abnormality. Postsurgical changes to the lower lumbar spine.
IMPRESSION: No acute abdominopelvic findings. Specifically, no evidence of
obstructive uropathy.

## 2021-05-14 MED ORDER — INSULIN ASPART 100 UNIT/ML IJ SOLN
0.0000 [IU] | Freq: Every day | INTRAMUSCULAR | Status: DC
  Administered 2021-05-14: 3 [IU] via SUBCUTANEOUS
  Administered 2021-05-15: 5 [IU] via SUBCUTANEOUS
  Administered 2021-05-16: 2 [IU] via SUBCUTANEOUS

## 2021-05-14 MED ORDER — ONDANSETRON HCL 4 MG/2ML IJ SOLN
4.0000 mg | Freq: Four times a day (QID) | INTRAMUSCULAR | Status: DC | PRN
  Administered 2021-05-16: 4 mg via INTRAVENOUS
  Filled 2021-05-14: qty 2

## 2021-05-14 MED ORDER — HEPARIN SODIUM (PORCINE) 5000 UNIT/ML IJ SOLN
5000.0000 [IU] | Freq: Three times a day (TID) | INTRAMUSCULAR | Status: DC
  Administered 2021-05-14 – 2021-05-17 (×8): 5000 [IU] via SUBCUTANEOUS
  Filled 2021-05-14 (×8): qty 1

## 2021-05-14 MED ORDER — ACETAMINOPHEN 325 MG PO TABS
650.0000 mg | ORAL_TABLET | Freq: Four times a day (QID) | ORAL | Status: DC | PRN
  Administered 2021-05-16 (×2): 650 mg via ORAL
  Filled 2021-05-14 (×2): qty 2

## 2021-05-14 MED ORDER — LACTATED RINGERS IV BOLUS
1000.0000 mL | Freq: Once | INTRAVENOUS | Status: AC
  Administered 2021-05-14: 1000 mL via INTRAVENOUS

## 2021-05-14 MED ORDER — INSULIN GLARGINE-YFGN 100 UNIT/ML ~~LOC~~ SOLN
10.0000 [IU] | Freq: Every day | SUBCUTANEOUS | Status: DC
  Administered 2021-05-15: 10 [IU] via SUBCUTANEOUS
  Filled 2021-05-14 (×2): qty 0.1

## 2021-05-14 MED ORDER — FLUCONAZOLE 150 MG PO TABS
150.0000 mg | ORAL_TABLET | Freq: Once | ORAL | Status: AC
  Administered 2021-05-14: 150 mg via ORAL
  Filled 2021-05-14: qty 1

## 2021-05-14 MED ORDER — INSULIN ASPART 100 UNIT/ML IJ SOLN
0.0000 [IU] | Freq: Three times a day (TID) | INTRAMUSCULAR | Status: DC
  Administered 2021-05-15 (×2): 15 [IU] via SUBCUTANEOUS
  Administered 2021-05-15 – 2021-05-16 (×3): 11 [IU] via SUBCUTANEOUS
  Administered 2021-05-16: 15 [IU] via SUBCUTANEOUS
  Administered 2021-05-17: 5 [IU] via SUBCUTANEOUS

## 2021-05-14 MED ORDER — CHLORHEXIDINE GLUCONATE CLOTH 2 % EX PADS
6.0000 | MEDICATED_PAD | Freq: Every day | CUTANEOUS | Status: DC
  Administered 2021-05-15 – 2021-05-16 (×2): 6 via TOPICAL

## 2021-05-14 MED ORDER — INSULIN ASPART PROT & ASPART (70-30 MIX) 100 UNIT/ML PEN: 75.0000 [IU] | PEN_INJECTOR | SUBCUTANEOUS | Status: DC

## 2021-05-14 MED ORDER — LEVETIRACETAM 500 MG PO TABS
500.0000 mg | ORAL_TABLET | Freq: Two times a day (BID) | ORAL | Status: DC
  Administered 2021-05-14 – 2021-05-17 (×6): 500 mg via ORAL
  Filled 2021-05-14 (×6): qty 1

## 2021-05-14 MED ORDER — LACTATED RINGERS IV BOLUS: 1000.0000 mL | Freq: Once | INTRAVENOUS | Status: DC

## 2021-05-14 MED ORDER — MORPHINE SULFATE (PF) 2 MG/ML IV SOLN
2.0000 mg | Freq: Once | INTRAVENOUS | Status: DC
Start: 2021-05-14 — End: 2021-05-14

## 2021-05-14 MED ORDER — ACETAMINOPHEN 650 MG RE SUPP: 650.0000 mg | Freq: Four times a day (QID) | RECTAL | Status: DC | PRN

## 2021-05-14 MED ORDER — FAMOTIDINE 20 MG PO TABS
10.0000 mg | ORAL_TABLET | Freq: Two times a day (BID) | ORAL | Status: DC
  Administered 2021-05-14 – 2021-05-17 (×6): 10 mg via ORAL
  Filled 2021-05-14 (×6): qty 1

## 2021-05-14 MED ORDER — SODIUM CHLORIDE 0.9 % IV SOLN: INTRAVENOUS | Status: DC

## 2021-05-14 MED ORDER — LINAGLIPTIN 5 MG PO TABS: 5.0000 mg | ORAL_TABLET | Freq: Every day | ORAL | Status: DC

## 2021-05-14 MED ORDER — OXYCODONE HCL 5 MG PO TABS
5.0000 mg | ORAL_TABLET | Freq: Four times a day (QID) | ORAL | Status: DC | PRN
  Administered 2021-05-15 – 2021-05-16 (×6): 5 mg via ORAL
  Filled 2021-05-14 (×6): qty 1

## 2021-05-14 MED ORDER — MELATONIN 5 MG PO TABS: 10.0000 mg | ORAL_TABLET | Freq: Every evening | ORAL | Status: DC | PRN

## 2021-05-14 MED ORDER — INSULIN GLARGINE-YFGN 100 UNIT/ML ~~LOC~~ SOLN
60.0000 [IU] | SUBCUTANEOUS | Status: AC
  Administered 2021-05-14: 60 [IU] via SUBCUTANEOUS
  Filled 2021-05-14: qty 10

## 2021-05-14 MED ORDER — LIDOCAINE 5 % EX PTCH
1.0000 | MEDICATED_PATCH | CUTANEOUS | Status: AC
  Filled 2021-05-14: qty 1

## 2021-05-14 MED ORDER — GABAPENTIN 300 MG PO CAPS
300.0000 mg | ORAL_CAPSULE | ORAL | Status: AC
Start: 2021-05-14 — End: 2021-05-14
  Administered 2021-05-14: 300 mg via ORAL
  Filled 2021-05-14: qty 1

## 2021-05-14 MED ORDER — LINAGLIPTIN 5 MG PO TABS
5.0000 mg | ORAL_TABLET | Freq: Every day | ORAL | Status: DC
  Filled 2021-05-14: qty 1

## 2021-05-14 MED ORDER — ONDANSETRON HCL 4 MG PO TABS: 4.0000 mg | ORAL_TABLET | Freq: Four times a day (QID) | ORAL | Status: DC | PRN

## 2021-05-14 NOTE — H&P (Signed)
History and Physical    [name] GGY:[id number] DOB: 01/15/1971 DOA: 05/14/2021  DOS: the patient was seen and examined on 05/14/2021  PCP: Pecolia Ades, NP   Patient coming from: Home  I have personally briefly reviewed patient's old medical records in [place]  CC: anuria since Sunday HPI: 51 year old female with a history of type 2 diabetes, hypertension, morbid obesity, chronic pain syndrome on chronic narcotics, history of seizure disorder who presents to the ER today with an area for about 36 hours.  Patient states that she last urinated on Saturday night.  She did not urinate at all on Sunday.  She states that she has been drinking water every day.  She states that she stays well-hydrated at home.  She states that she drinks upwards of a gallon of water a day.  She denies any nonsteroidal anti-inflammatory use.  She states that she has had a history of renal failure in the past was told to stop all nonsteroidals.  She is also required hemodialysis temporarily due to her renal failure.  Going through her chart, appears the patient had been on Celebrex and Neurontin in the past.  She was told to stop both these medicines.  Since her discharge from renal failure back in October of last year, she was restarted on Neurontin.  Not sure by what provider.  Patient denies any fever, chills.  She has not had any vomiting.  There has been no diarrhea.  Patient takes a multitude of medications, some of which are renally cleared.   On arrival to the ER, temp 98 heart rate 110 blood pressure 84 over 7394% room air saturations.  Patient given 3 L of IV lactated Ringer's.  This did improve her blood pressure.  Labs showed a UA that was negative for any protein.  Specific gravity greater than 1.030.  There were no casts.  Chemistry sodium 141, potassium 4.4, chloride 100, bicarb 20, BUN of 43, creatinine 3.99, glucose of 376.  AST of 49 ALT 36 alk phos of 129 total bili  0.5  Lipase was normal at 38  White count 10.5, hemoglobin 14.6 platelets of 216  CT urogram was negative for any evidence of obstructive uropathy.  There is no kidney stones.  Due to her anuria and acute renal failure, [facility] hospitalist contacted for admission.   ED Course: hypotensive in ER. Responded to IVF. Scr 3.99. UA negative. CT urogram negative for obstruction. Foley catheter placed. No bladder outlet obstruction  Review of Systems:  Review of Systems  Constitutional:  Negative for chills, fever and weight loss.  HENT: Negative.    Eyes: Negative.   Respiratory: Negative.    Cardiovascular: Negative.   Gastrointestinal:  Negative for abdominal pain, diarrhea, nausea and vomiting.  Genitourinary:        +anuria  Musculoskeletal:  Positive for back pain.       Chronic back pain  Skin: Negative.  Negative for itching and rash.  Neurological: Negative.   Endo/Heme/Allergies: Negative.   Psychiatric/Behavioral: Negative.    All other systems reviewed and are negative.  Past Medical History:  Diagnosis Date   Acute renal failure (ARF) ([place]) 09/02/2012   Anemia    Anxiety    panic attacks   Back pain    Blood transfusion    Chronic heel ulcer ([place])    Chronic kidney disease    Chronic pain syndrome    Depression    Diabetes mellitus    type  II    DJD (degenerative joint disease)    Dysrhythmia    pt unsure what this was   Fibromyalgia    GERD (gastroesophageal reflux disease)    H/O: Bell's palsy 2011   Heart murmur    "slight one"   History of 2019 novel coronavirus disease (COVID-19) 03/25/2019   History of kidney stones    History of MRSA infection OF ULCER   Hypertension    no longer taken since 10-11 months per patient at preop phone call of 10/13/2017    Hypothyroidism    Hypoventilation associated with obesity syndrome (HCC)    Migraine    Non-healing non-surgical wound    Right hip, has Wound vac to hip.  Started as a skin tear.   Peripheral  vascular disease (HCC)    PONV (postoperative nausea and vomiting)    can be slow to wake up after surgery   Postoperative abscess 10/27/2017   Right foot drop    Shortness of breath    with Activity   Sleep apnea    "study shows not bad enough for CPAP.", pt denies   Umbilical hernia     Past Surgical History:  Procedure Laterality Date   BACK SURGERY     for lumbar disc disease [place]   Tumor removed- has steel plate   BRAIN SURGERY      Plating due to soft spot closing too early- age 56   BREAST LUMPECTOMY WITH NEEDLE LOCALIZATION Right 08/23/2013   Procedure: RIGHT BREAST LUMPECTOMY WITH NEEDLE LOCALIZATION;  Surgeon: Odis Hollingshead, MD;  Location: [place];  Service: General;  Laterality: Right;   CHOLECYSTECTOMY  3704   EXCISION UMBILICAL NODULE N/A 8/88/9169   Procedure: EXCISION OF UMBILICUS;  Surgeon: Coralie Keens, MD;  Location: [facility];  Service: General;  Laterality: N/A;   EYE SURGERY     eye lift   I & D EXTREMITY  04/02/2011   Procedure: IRRIGATION AND DEBRIDEMENT EXTREMITY;  Surgeon: Mcarthur Rossetti;  Location: [facility];  Service: Orthopedics;  Laterality: Right;  I&D right heel ulcer, placement of A-cell graft   INCISION AND DRAINAGE ABSCESS N/A 10/28/2017   Procedure: INCISION AND DRAINAGE UMBILICAL HERNIA ABSCESS;  Surgeon: Ralene Ok, MD;  Location: [facility];  Service: General;  Laterality: N/A;   INCISION AND DRAINAGE OF WOUND  08/29/2011   Procedure: IRRIGATION AND DEBRIDEMENT WOUND;  Surgeon: Theodoro Kos, DO;  Location: [facility];  Service: Plastics;  Laterality: Right;   INSERTION OF MESH N/A 10/16/2017   Procedure: INSERTION OF MESH;  Surgeon: Coralie Keens, MD;  Location: [facility];  Service: General;  Laterality: N/A;   LITHOTRIPSY     2007ish   right elbow     UMBILICAL HERNIA REPAIR N/A 10/16/2017   Procedure: UMBILICAL HERNIA REPAIR WITH MESH;  Surgeon: Coralie Keens, MD;  Location: [facility];  Service: General;  Laterality: N/A;      reports that she has never smoked. She has never used smokeless tobacco. She reports that she does not drink alcohol and does not use drugs.  Allergies  Allergen Reactions   Penicillins Hives and Other (See Comments)    HAS PT DEVELOPED SEVERE RASH INVOLVING MUCUS MEMBRANES or SKIN NECROSIS: #  #  YES  #  # PATIENT HAS HAD A PCN REACTION THAT REQUIRED HOSPITALIZATION:  #  #  YES  #  #   Tolerates amoxicillin on multiple occasions per Dr. Darrel Hoover note  11/01/17.  TDD.   Clindamycin/Lincomycin Hives, Dermatitis and Rash   Nsaids Other (See Comments)    Avoid due to kidney failure caused by celebrex    Sulfa Antibiotics Hives   Dulaglutide Other (See Comments)    unknown   No [employer] Food Allergies Hives    Chicken   Versed [Midazolam] Nausea And Vomiting    Pt had medication on October 16 2017 with no issues    Family History  Problem Relation Age of Onset   Diabetes type II Father    Heart attack Father    Peripheral vascular disease Father    Diabetes type II Mother    Anesthesia problems Mother    Heart attack Mother    Peripheral vascular disease Mother    Hypertension Mother     Prior to Admission medications   Medication Sig Start Date End Date Taking? Authorizing Provider  acetaminophen (TYLENOL) 325 MG tablet Take 2 tablets (650 mg total) by mouth every 6 (six) hours as needed for mild pain (or Fever >/= 101). 04/03/20  Yes Mikhail, Velta Addison, DO  amitriptyline (ELAVIL) 25 MG tablet Take 1 tablet (25 mg total) by mouth at bedtime. 04/03/20  Yes Mikhail, Maryann, DO  ASPIRIN LOW DOSE 81 MG EC tablet Take 243 mg by mouth daily. 01/19/21  Yes [provider]  atorvastatin (LIPITOR) 20 MG tablet Take 20 mg by mouth daily.   Yes [provider]  Biotin 10000 MCG TABS Take 10,000 mcg by mouth daily.   Yes [provider]  carisoprodol (SOMA) 350 MG tablet Take 350 mg by mouth 4 (four) times daily as needed. 05/01/21  Yes [provider]   cetirizine (ZYRTEC) 10 MG tablet Take 10 mg by mouth daily as needed for allergies.   Yes [provider]  Cholecalciferol (VITAMIN D3) 50 MCG (2000 UT) TABS Take 1 tablet by mouth daily. 01/19/21  Yes [provider]  Cyanocobalamin (VITAMIN DEFICIENCY SYSTEM-B12) 1000 MCG/ML KIT Inject 1,000 mcg as directed every 30 (thirty) days.   Yes [provider]  Diclofenac Sodium 2 % SOLN Place 2-4 g onto the skin 4 (four) times daily as needed. Patient taking differently: Place 4 g onto the skin 4 (four) times daily as needed (for pain). 10/08/18  Yes Mcarthur Rossetti, MD  DULoxetine (CYMBALTA) 60 MG capsule Take 1 capsule (60 mg total) by mouth 2 (two) times daily. 12/30/20  Yes Regalado, Belkys A, MD  fentaNYL (DURAGESIC) 50 MCG/HR Place 1 patch onto the skin every 3 (three) days. 04/04/20  Yes Mikhail, Maryann, DO  gabapentin (NEURONTIN) 600 MG tablet Take 0.5 tablets (300 mg total) by mouth 3 (three) times daily. 04/03/20  Yes Mikhail, Clinical biochemist, DO  insulin aspart protamine - aspart (NOVOLOG MIX 70/30 FLEXPEN) (70-30) 100 UNIT/ML FlexPen Inject 75 Units into the skin in the morning, at noon, and at bedtime.   Yes [provider]  levothyroxine (SYNTHROID) 150 MCG tablet Take 150 mcg by mouth daily before breakfast.   Yes [provider]  lidocaine (LIDODERM) 5 % Place 3 patches onto the skin See admin instructions. Place 3 patches daily to affected areas and replace every 24 hours 05/30/17  Yes [provider]  NARCAN 4 MG/0.1ML LIQD nasal spray kit Place 4 mg into the nose daily as needed (opioid overdose). 10/22/17  Yes [provider]  NEXIUM 40 MG capsule Take 40 mg by mouth daily. 02/06/21  Yes [provider]  nystatin (MYCOSTATIN/NYSTOP) powder Apply 1  application topically 3 (three) times daily. 04/02/21  Yes [provider]  omega-3 acid ethyl esters (LOVAZA) 1 g capsule Take 1 capsule by mouth 2 (two) times daily.  09/07/20  Yes [provider]  Oxycodone HCl 10 MG TABS Take 1 tablet (10 mg total) by mouth every 6 (six) hours as needed (for pain). 12/30/20  Yes Regalado, Belkys A, MD  OZEMPIC, 0.25 OR 0.5 MG/DOSE, 2 MG/1.5ML SOPN Inject 0.5 mg into the skin every Thursday. 05/09/21  Yes [provider]  sitaGLIPtin (JANUVIA) 50 MG tablet Take 50 mg by mouth daily.   Yes [provider]  SYMBICORT 160-4.5 MCG/ACT inhaler Inhale 2 puffs into the lungs 2 (two) times daily as needed for shortness of breath or wheezing. 02/22/21  Yes [provider]  triamcinolone cream (KENALOG) 0.1 % Apply 1 application topically 2 (two) times daily as needed (skin rash). 12/04/20  Yes [provider]  levETIRAcetam (KEPPRA) 750 MG tablet Take 750 mg by mouth 2 (two) times daily. Patient not taking: Reported on 05/14/2021 03/04/19   [provider]  pantoprazole (PROTONIX) 40 MG tablet Take 1 tablet (40 mg total) by mouth daily. Patient not taking: Reported on 05/14/2021 04/04/20   Cristal Ford, DO  senna (SENOKOT) 8.6 MG TABS tablet Take 8.6 mg by mouth daily as needed for mild constipation.  Patient not taking: Reported on 05/14/2021    [provider]  TRULICITY 3 TU/8.8KC SOPN Inject 3 mg into the skin once a week. Every Thursday Patient not taking: Reported on 05/14/2021 12/04/20   [provider]    Physical Exam: Vitals:   05/14/21 1604 05/14/21 1700 05/14/21 1800 05/14/21 1923  BP:  94/66 98/64 124/60  Pulse:  96 92 98  Resp:   20 19  Temp:    98.6 F (37 C)  TempSrc:    Oral  SpO2: 93% 91% 94% 97%  Weight:      Height:    '5\' 6"'  (1.676 m)    Physical Exam Vitals and nursing note reviewed.  Constitutional:      General: She is not in acute distress.    Appearance: She is obese. She is not toxic-appearing or diaphoretic.  HENT:     Head: Normocephalic and atraumatic.     Nose: Nose normal. No rhinorrhea.  Eyes:     General:        Right  eye: No discharge.        Left eye: No discharge.  Cardiovascular:     Rate and Rhythm: Normal rate and regular rhythm.     Pulses: Normal pulses.  Pulmonary:     Effort: Pulmonary effort is normal. No respiratory distress.     Breath sounds: No wheezing or rales.  Abdominal:     General: Abdomen is protuberant. Bowel sounds are normal. There is no distension.     Palpations: Abdomen is soft.     Tenderness: There is no abdominal tenderness. There is no guarding or rebound.     Comments: Small ventral hernia near umbilicus  Skin:    General: Skin is warm and dry.     Capillary Refill: Capillary refill takes less than 2 seconds.  Neurological:     General: No focal deficit present.     Mental Status: She is alert and oriented to person, place, and time.     Labs on Admission: I have personally reviewed following labs and imaging studies  CBC: Recent Labs  Lab 05/14/21  1003  WBC 10.5  NEUTROABS 4.9  HGB 14.6  HCT 44.3  MCV 93.3  PLT 130   Basic Metabolic Panel: Recent Labs  Lab 05/14/21 1003  NA 131*  K 4.4  CL 100  CO2 20*  GLUCOSE 376*  BUN 43*  CREATININE 3.99*  CALCIUM 9.0   GFR: Estimated Creatinine Clearance: 24.4 mL/min (A) (by C-G formula based on SCr of 3.99 mg/dL (H)). Liver Function Tests: Recent Labs  Lab 05/14/21 1003  AST 49*  ALT 36  ALKPHOS 129*  BILITOT 0.5  PROT 7.4  ALBUMIN 3.8   Recent Labs  Lab 05/14/21 1003  LIPASE 38   No results for input(s): AMMONIA in the last 168 hours. Coagulation Profile: No results for input(s): INR, PROTIME in the last 168 hours. Cardiac Enzymes: No results for input(s): CKTOTAL, CKMB, CKMBINDEX, TROPONINI in the last 168 hours. BNP (last 3 results) No results for input(s): PROBNP in the last 8760 hours. HbA1C: No results for input(s): HGBA1C in the last 72 hours. CBG: Recent Labs  Lab 05/14/21 1557 05/14/21 2109  GLUCAP 312* 254*   Lipid Profile: No results for input(s): CHOL, HDL,  LDLCALC, TRIG, CHOLHDL, LDLDIRECT in the last 72 hours. Thyroid Function Tests: No results for input(s): TSH, T4TOTAL, FREET4, T3FREE, THYROIDAB in the last 72 hours. Anemia Panel: No results for input(s): VITAMINB12, FOLATE, FERRITIN, TIBC, IRON, RETICCTPCT in the last 72 hours. Urine analysis:    Component Value Date/Time   COLORURINE YELLOW 05/14/2021 1313   APPEARANCEUR CLEAR 05/14/2021 1313   LABSPEC >1.030 (H) 05/14/2021 1313   PHURINE 5.0 05/14/2021 1313   GLUCOSEU NEGATIVE 05/14/2021 1313   HGBUR NEGATIVE 05/14/2021 1313   BILIRUBINUR MODERATE (A) 05/14/2021 1313   KETONESUR 15 (A) 05/14/2021 1313   PROTEINUR NEGATIVE 05/14/2021 1313   UROBILINOGEN 0.2 09/05/2012 1141   NITRITE NEGATIVE 05/14/2021 1313   LEUKOCYTESUR NEGATIVE 05/14/2021 1313    Radiological Exams on Admission: I have personally reviewed images CT Renal Stone Study  Result Date: 05/14/2021 CLINICAL DATA:  Bilateral flank pain EXAM: CT ABDOMEN AND PELVIS WITHOUT CONTRAST TECHNIQUE: Multidetector CT imaging of the abdomen and pelvis was performed following the standard protocol without IV contrast. RADIATION DOSE REDUCTION: This exam was performed according to the departmental dose-optimization program which includes automated exposure control, adjustment of the mA and/or kV according to patient size and/or use of iterative reconstruction technique. COMPARISON:  CT 12/26/2020 FINDINGS: Lower chest: Mild bibasilar atelectasis.  Heart size is normal. Hepatobiliary: No focal liver abnormality is seen. Status post cholecystectomy. No biliary dilatation. Pancreas: Unremarkable. No pancreatic ductal dilatation or surrounding inflammatory changes. Spleen: Normal in size without focal abnormality. Adrenals/Urinary Tract: Adrenal glands are unremarkable. Kidneys are normal, without renal calculi, focal lesion, or hydronephrosis. Bladder is unremarkable for the degree of distension. Stomach/Bowel: Stomach is within normal limits.  No evidence of bowel wall thickening, distention, or inflammatory changes. Vascular/Lymphatic: No significant vascular findings are present. Multiple mildly prominent upper abdominal lymph nodes, stable from prior. No enlarged pelvic lymph nodes. Reproductive: IUD within the uterus. Uterus and adnexal regions are otherwise unremarkable. Other: No free fluid. No abdominopelvic fluid collection. No pneumoperitoneum. No abdominal wall hernia. Musculoskeletal: Redemonstration of mixed attenuation partially calcified mass within the subcutaneous soft tissues at the medial aspect of the left gluteal region. This is stable in size and appearance compared to multiple previous studies and is most compatible with a benign entity such as fat necrosis. No acute bony abnormality. Postsurgical changes to the lower  lumbar spine. IMPRESSION: No acute abdominopelvic findings. Specifically, no evidence of obstructive uropathy. Electronically Signed   By: Davina Poke D.O.   On: 05/14/2021 11:09    EKG: I have personally reviewed EKG: NSR   Assessment/Plan Principal Problem:   Acute renal failure (ARF) (HCC) Active Problems:   Chronic pain   Oliguria and anuria   Chronically on opiate therapy   Morbidly obese (HCC)   Controlled type 2 diabetes mellitus without complication, without long-term current use of insulin (HCC)   Sleep apnea   Hypothyroidism   Seizures ([place])    Assessment and Plan: * Acute renal failure (ARF) ([place])- (present on admission) Admit to medical bed. Continue with IVF. UA is bland. Pt denies any NSAIDs. Was told in the past that neurontin or celebrex could have been the cause of her renal failure. Has needed HD in the past for ARF.  Somehow, pt was restarted on neurontin as outpatient for her pain. Pt drinks about 1 gallon of water per day. No recent diarrhea, vomiting. Does not take any diuretics. Doubt intra-vascular volume depletion as the source of her renal failure. Most likely this is  a polypharmacy situation with multiple meds that are renally cleared.  Pt does states that she was changed to Ozempic recently(had been taking Trulicity).  Package Insert does not list ARF as side effect of Ozempic.  Discussed with the patient that we will need to stop all medications that are renally cleared or of the potential to cause renal failure.  This includes her pain medications.  She is hesitant about stopping her pain medicines but understands that with her renal function the way that she is, she may need to start dialysis again if her renal function does not improve.  We will go ahead and stop her fentanyl patch.  If she has any withdrawal symptoms, and use oxycodone sparingly.  CT urogram was negative for any stones.  There is no hydronephrosis.  Her Foley catheter was placed.  There is no urinary outlet obstruction.  She has no protein in urine.  I doubt this is myeloma.  There are no RBC casts.  This is not nephritic syndrome.  She has not had any sore throats or recent strep infections.  Doubt this is post strep GN.  Her liver enzymes are near normal.  Doubt this is hepatorenal syndrome.  She has no history of heart failure.  Doubt this is cardiorenal syndrome.  If her serum creatinine does not improve tomorrow with IV fluids, she may benefit from seeing nephrology.  Chronically on opiate therapy Will temporarily stop fentanyl patch. Pt states she is down from a high of 400 mcg/hr. She has slowly tapered down over time.  Oliguria and anuria- (present on admission) Continue IVF and foley catheter  Chronic pain- (present on admission) Stop fentanyl pain. Only use oxycodone sparingly for pain.  Seizures ([place]) Continue keppra. No recent seizures.  Hypothyroidism- (present on admission) Stable. Check TSH.  Sleep apnea- (present on admission) Continue CPAP.  Controlled type 2 diabetes mellitus without complication, without long-term current use of insulin (HCC) Add low dose lantus and  SSI.  Morbidly obese ([place])- (present on admission) Chronic.   DVT prophylaxis: SQ Heparin Code Status: Full Code Family Communication: no family at bedside  Disposition Plan: return home  Consults called: none  Admission status: Inpatient, Telemetry bed   Kristopher Oppenheim, DO [facility] 05/14/2021, 9:12 PM

## 2021-05-14 NOTE — Progress Notes (Signed)
Pt states the she does not use CPAP qhs at home and doesn't want to wear it while here.

## 2021-05-14 NOTE — Progress Notes (Signed)
Plan of Care Note for accepted transfer   Patient: FAHIMA CIFELLI MRN: [id number]   [place]: 05/14/2021  Facility requesting transfer: [facility] Requesting Provider: Dr. Pearline Cables Reason for transfer: AKI Facility course: 51 yo F presenting with lower back pain. Found to have AKI. Baseline SCr 0.9. She's 3.99 today at presentation. Imaging negative. Anuric since yesterday. Trying to get urine sample. No fever, normal WBC. Getting fluids. Question if medications are causing her this issue. She was hypotensive at presentation, but improving with fluids.  Plan of care: The patient is accepted for admission to Telemetry unit, at [facility]. While holding at [facility], medical decisions/orders will remain with the ED staff. [facility] will assume care with the patient arrives at [facility]. Upon arrival, the nursing staff will need to notify PATIENT PLACEMENT so that the proper [facility] physician can evaluate the patient.   Author: Jonnie Finner, DO 05/14/2021  Check [email or web address] for on-call coverage.  Nursing staff, Please call [place] number on Amion as soon as patient's arrival, so appropriate admitting provider can evaluate the pt.

## 2021-05-14 NOTE — ED Notes (Signed)
Presents with back pain, states last voided was yesterday 0700hrs. Ankle swelling is noted.

## 2021-05-14 NOTE — Assessment & Plan Note (Addendum)
-  Continue keppra. No recent seizures.

## 2021-05-14 NOTE — ED Notes (Signed)
Will hold fluid bolus at this time

## 2021-05-14 NOTE — Assessment & Plan Note (Addendum)
Acute urinary retention - UA relatively unremarkable on admission -In the past she was told that Celebrex may have contributed to her renal failure in January 2022.  She also required renal adjustment of Neurontin.  [name] was also discontinued at that time -Again presents with concern for possible polypharmacy contributing to renal failure along with possible volume depletion. - CT renal stone study was unremarkable -Foley catheter placed on presentation. -Presented with creatinine of 3.99.  Treated with IV fluids.  Creatinine improved to 0.95 today. -DC Foley catheter today.  If goes back into retention, another Foley catheter will be placed and she will need outpatient urology follow-up.  Discharge patient home today.  Outpatient follow-up of BMP.

## 2021-05-14 NOTE — Assessment & Plan Note (Addendum)
Continue CPAP.  

## 2021-05-14 NOTE — ED Triage Notes (Signed)
C/o lower back pain, hast not urinated x 28 hours,  shaking

## 2021-05-14 NOTE — Assessment & Plan Note (Addendum)
Continue IVF and foley catheter -See AKI

## 2021-05-14 NOTE — Assessment & Plan Note (Addendum)
-   Outpatient follow-up °

## 2021-05-14 NOTE — Assessment & Plan Note (Deleted)
-  Fentanyl patch on hold.  Continue as needed oxycodone.  Outpatient follow-up with PCP/pain management

## 2021-05-14 NOTE — ED Notes (Signed)
Bladder Scan performed with this RN and ED PA Gerald Stabs), can visualize bladder easily and no urine in bladder was noted, orders for IVF bolus was given and initiated via infusion pump

## 2021-05-14 NOTE — Subjective & Objective (Signed)
CC: anuria since Sunday HPI: 51 year old female with a history of type 2 diabetes, hypertension, morbid obesity, chronic pain syndrome on chronic narcotics, history of seizure disorder who presents to the ER today with an area for about 36 hours.  Patient states that she last urinated on Saturday night.  She did not urinate at all on Sunday.  She states that she has been drinking water every day.  She states that she stays well-hydrated at home.  She states that she drinks upwards of a gallon of water a day.  She denies any nonsteroidal anti-inflammatory use.  She states that she has had a history of renal failure in the past was told to stop all nonsteroidals.  She is also required hemodialysis temporarily due to her renal failure.  Going through her chart, appears the patient had been on Celebrex and Neurontin in the past.  She was told to stop both these medicines.  Since her discharge from renal failure back in October of last year, she was restarted on Neurontin.  Not sure by what provider.  Patient denies any fever, chills.  She has not had any vomiting.  There has been no diarrhea.  Patient takes a multitude of medications, some of which are renally cleared.   On arrival to the ER, temp 98 heart rate 110 blood pressure 84 over 7394% room air saturations.  Patient given 3 L of IV lactated Ringer's.  This did improve her blood pressure.  Labs showed a UA that was negative for any protein.  Specific gravity greater than 1.030.  There were no casts.  Chemistry sodium 141, potassium 4.4, chloride 100, bicarb 20, BUN of 43, creatinine 3.99, glucose of 376.  AST of 49 ALT 36 alk phos of 129 total bili 0.5  Lipase was normal at 38  White count 10.5, hemoglobin 14.6 platelets of 216  CT urogram was negative for any evidence of obstructive uropathy.  There is no kidney stones.  Due to her anuria and acute renal failure, [facility] hospitalist contacted for admission.

## 2021-05-14 NOTE — ED Provider Notes (Signed)
[facility] Provider Note   CSN: [id number] Arrival date & time: 05/14/21  0857     History  Chief Complaint  Patient presents with   Back Pain   Dysuria    [name] is a 51 y.o. female with a complex medical history that includes acute renal failure, anemia, anxiety, kidney stones, chronic low back pain, fibromyalgia, diabetes, DJD, GERD, hypertension, hypothyroidism, morbid obesity, sleep apnea.  Patient reports that she has been unable to urinate since yesterday.  Patient reports that she has had history of AKI's in the past, at one point requiring dialysis and inpatient admission.  Based on chart review, internal medicine believes that her past episodes of urinary retention/AKI have been due to her high dosages of narcotic pain medication along with gabapentin.  Patient has chronic low back pain, chronic knee pain.  Patient states that she has decreased her narcotic dosages since being told this.  Patient reports that she has been unable to urinate since yesterday, and began developing bilateral low back pain that she reported as "bee stinging her" that she said was consistent with the last time she had an AKI.  Patient reports that she had slight dysuria yesterday when she was able to urinate, she states that when she urinated she was able to completely empty her bladder without problems. Patient denies any foul smell to her urine, recent fevers, nausea, vomiting.  Patient feels as if her abdomen is bloated.   Back Pain Associated symptoms: dysuria   Associated symptoms: no abdominal pain, no chest pain, no fever and no weakness   Dysuria Associated symptoms: no abdominal pain, no fever, no nausea and no vomiting       Home Medications Prior to Admission medications   Medication Sig Start Date End Date Taking? Authorizing Provider  acetaminophen (TYLENOL) 325 MG tablet Take 2 tablets (650 mg total) by mouth every 6 (six) hours as needed for  mild pain (or Fever >/= 101). Patient not taking: Reported on 12/27/2020 04/03/20   Cristal Ford, DO  amitriptyline (ELAVIL) 25 MG tablet Take 1 tablet (25 mg total) by mouth at bedtime. 04/03/20   Mikhail, Velta Addison, DO  atorvastatin (LIPITOR) 20 MG tablet Take 20 mg by mouth daily.    [provider]  Biotin 10000 MCG TABS Take 10,000 mcg by mouth daily.    [provider]  cetirizine (ZYRTEC) 10 MG tablet Take 10 mg by mouth daily as needed for allergies.    [provider]  Cyanocobalamin (VITAMIN DEFICIENCY SYSTEM-B12) 1000 MCG/ML KIT Inject 1,000 mcg as directed every 30 (thirty) days.    [provider]  Diclofenac Sodium 2 % SOLN Place 2-4 g onto the skin 4 (four) times daily as needed. Patient taking differently: Place 4 g onto the skin 4 (four) times daily as needed (for pain). 10/08/18   Mcarthur Rossetti, MD  DULoxetine (CYMBALTA) 60 MG capsule Take 1 capsule (60 mg total) by mouth 2 (two) times daily. 12/30/20   Regalado, Belkys A, MD  fentaNYL (DURAGESIC) 50 MCG/HR Place 1 patch onto the skin every 3 (three) days. 04/04/20   Mikhail, Velta Addison, DO  gabapentin (NEURONTIN) 600 MG tablet Take 0.5 tablets (300 mg total) by mouth 3 (three) times daily. 04/03/20   Mikhail, Velta Addison, DO  insulin aspart protamine - aspart (NOVOLOG MIX 70/30 FLEXPEN) (70-30) 100 UNIT/ML FlexPen Inject 75 Units into the skin in the morning, at noon, and at bedtime.    [provider]  levETIRAcetam (KEPPRA) 750 MG tablet Take 750 mg by mouth 2 (two) times daily. 03/04/19   [provider]  levothyroxine (SYNTHROID) 150 MCG tablet Take 150 mcg by mouth daily before breakfast.    [provider]  lidocaine (LIDODERM) 5 % Place 3 patches onto the skin See admin instructions. Place 3 patches daily to affected areas and replace every 24 hours 05/30/17   [provider]  NARCAN 4 MG/0.1ML LIQD nasal spray kit Place 4 mg into the nose daily as needed  (opioid overdose). 10/22/17   [provider]  omega-3 acid ethyl esters (LOVAZA) 1 g capsule Take 1 capsule by mouth 2 (two) times daily. 09/07/20   [provider]  Oxycodone HCl 10 MG TABS Take 1 tablet (10 mg total) by mouth every 6 (six) hours as needed (for pain). 12/30/20   Regalado, Belkys A, MD  pantoprazole (PROTONIX) 40 MG tablet Take 1 tablet (40 mg total) by mouth daily. 04/04/20   Mikhail, Velta Addison, DO  senna (SENOKOT) 8.6 MG TABS tablet Take 8.6 mg by mouth daily as needed for mild constipation.     [provider]  sitaGLIPtin (JANUVIA) 50 MG tablet Take 50 mg by mouth daily.    [provider]  triamcinolone cream (KENALOG) 0.1 % Apply 1 application topically 2 (two) times daily as needed (skin rash). 12/04/20   [provider]  TRULICITY 3 QQ/2.2LN SOPN Inject 3 mg into the skin once a week. Every Thursday 12/04/20   [provider]      Allergies    Penicillins, Clindamycin/lincomycin, Nsaids, Sulfa antibiotics, and Versed [midazolam]    Review of Systems   Review of Systems  Constitutional:  Negative for chills and fever.  Respiratory:  Negative for shortness of breath.   Cardiovascular:  Negative for chest pain.  Gastrointestinal:  Negative for abdominal pain, diarrhea, nausea and vomiting.  Genitourinary:  Positive for difficulty urinating and dysuria.  Musculoskeletal:  Positive for back pain.  Neurological:  Negative for dizziness, weakness and light-headedness.  All other systems reviewed and are negative.  Physical Exam Updated Vital Signs BP 102/84 (BP Location: Left Arm)    Pulse 99    Temp 98 F (36.7 C) (Oral)    Resp 18    Ht '5\' 6"'  (1.676 m)    Wt (!) 145.6 kg    SpO2 92%    BMI 51.81 kg/m  Physical Exam Vitals and nursing note reviewed.  Constitutional:      General: She is not in acute distress.    Appearance: She is obese. She is not ill-appearing, toxic-appearing or diaphoretic.  HENT:     Head:  Normocephalic and atraumatic.     Nose: Nose normal. No congestion.     Mouth/Throat:     Mouth: Mucous membranes are moist.  Eyes:     Extraocular Movements: Extraocular movements intact.     Conjunctiva/sclera: Conjunctivae normal.     Pupils: Pupils are equal, round, and reactive to light.  Cardiovascular:     Rate and Rhythm: Normal rate and regular rhythm.  Pulmonary:     Effort: Pulmonary effort is normal.     Breath sounds: Normal breath sounds. No wheezing.  Abdominal:     General: Bowel sounds are normal. There is distension.     Tenderness: There is no abdominal tenderness. There is no right CVA tenderness or left CVA tenderness.  Musculoskeletal:        General: Tenderness (Bilateral low  back) present. Normal range of motion.     Cervical back: Normal range of motion and neck supple. No rigidity or tenderness.  Skin:    General: Skin is warm and dry.     Capillary Refill: Capillary refill takes less than 2 seconds.  Neurological:     Mental Status: She is alert and oriented to person, place, and time.    ED Results / Procedures / Treatments   Labs (all labs ordered are listed, but only abnormal results are displayed) Labs Reviewed  CBC WITH DIFFERENTIAL/PLATELET - Abnormal; Notable for the following components:      Result Value   Lymphs Abs 4.7 (*)    All other components within normal limits  COMPREHENSIVE METABOLIC PANEL - Abnormal; Notable for the following components:   Sodium 131 (*)    CO2 20 (*)    Glucose, Bld 376 (*)    BUN 43 (*)    Creatinine, Ser 3.99 (*)    AST 49 (*)    Alkaline Phosphatase 129 (*)    GFR, Estimated 13 (*)    All other components within normal limits  RESP PANEL BY RT-PCR (FLU A&B, COVID) ARPGX2  LIPASE, BLOOD  HCG, SERUM, QUALITATIVE  URINALYSIS, ROUTINE W REFLEX MICROSCOPIC    EKG None  Radiology CT Renal Stone Study  Result Date: 05/14/2021 CLINICAL DATA:  Bilateral flank pain EXAM: CT ABDOMEN AND PELVIS WITHOUT  CONTRAST TECHNIQUE: Multidetector CT imaging of the abdomen and pelvis was performed following the standard protocol without IV contrast. RADIATION DOSE REDUCTION: This exam was performed according to the departmental dose-optimization program which includes automated exposure control, adjustment of the mA and/or kV according to patient size and/or use of iterative reconstruction technique. COMPARISON:  CT 12/26/2020 FINDINGS: Lower chest: Mild bibasilar atelectasis.  Heart size is normal. Hepatobiliary: No focal liver abnormality is seen. Status post cholecystectomy. No biliary dilatation. Pancreas: Unremarkable. No pancreatic ductal dilatation or surrounding inflammatory changes. Spleen: Normal in size without focal abnormality. Adrenals/Urinary Tract: Adrenal glands are unremarkable. Kidneys are normal, without renal calculi, focal lesion, or hydronephrosis. Bladder is unremarkable for the degree of distension. Stomach/Bowel: Stomach is within normal limits. No evidence of bowel wall thickening, distention, or inflammatory changes. Vascular/Lymphatic: No significant vascular findings are present. Multiple mildly prominent upper abdominal lymph nodes, stable from prior. No enlarged pelvic lymph nodes. Reproductive: IUD within the uterus. Uterus and adnexal regions are otherwise unremarkable. Other: No free fluid. No abdominopelvic fluid collection. No pneumoperitoneum. No abdominal wall hernia. Musculoskeletal: Redemonstration of mixed attenuation partially calcified mass within the subcutaneous soft tissues at the medial aspect of the left gluteal region. This is stable in size and appearance compared to multiple previous studies and is most compatible with a benign entity such as fat necrosis. No acute bony abnormality. Postsurgical changes to the lower lumbar spine. IMPRESSION: No acute abdominopelvic findings. Specifically, no evidence of obstructive uropathy. Electronically Signed   By: Davina Poke D.O.    On: 05/14/2021 11:09    Procedures Procedures    Medications Ordered in ED Medications  lidocaine (LIDODERM) 5 % 1 patch (1 patch Transdermal Patient Refused/Not Given 05/14/21 1039)  lactated ringers bolus 1,000 mL (has no administration in time range)  lactated ringers bolus 1,000 mL (1,000 mLs Intravenous New Bag/Given 05/14/21 1039)    ED Course/ Medical Decision Making/ A&P  Medical Decision Making Amount and/or Complexity of Data Reviewed Labs: ordered. Radiology: ordered.  Risk Prescription drug management. Decision regarding hospitalization.   51 year old emergency ED for evaluation of supposed urinary retention since yesterday.  Patient has history of AKI with recent admission back in October for same.  On examination, the patient is afebrile, tachycardic up to 110 with hypotension.  Patient last reported blood pressure was 102/84.  Patient denies lightheadedness, dizziness, weakness, shortness of breath.  Patient abdomen is distended.  Patient lung sounds are clear to auscultation bilaterally, no rales or wheezing.  Patient hypotension and tachycardia brings in a question whether or not this patient is hypovolemic.  She reports that she has been drinking plenty of fluids in the last few days.  Following labs and imaging studies were ordered and interpreted by me: -Bladder scan conducted, showed no significant amount of bladder in urine.   -CT renal stone study does not show any signs of hydronephrosis, obstruction. -CMP shows slight hyponatremia at 131, elevated BUN to 43, elevated creatinine at 3.99, decreased GFR to 13.  These values are a deviation for the patient's baseline of a creatinine of 0.99.  -This patient has an AKI. -Patient CBC unremarkable -Patient lipase unremarkable -Patient EKG sinus rhythm.  Unchanged from past EKG. -Patient urinalysis is still pending.  This patient reports that she is not able to urinate.  I have ordered the  tech to do an in and out cath so we can obtain a urine sample.  At this time, this patient requires inpatient admission due to her AKI.  She will need IV fluids.  I have started the patient on 1000 mL of lactated Ringer's so far.  I have ordered a second lactated Ringer's bolus 1000 mL as well.  This patient is complaining of low back pain, I have offered her lidocaine patch for her back.  She refused stating that this does not help her pain.  I am apprehensive to start her on any IV narcotic pain medication here as this is supposedly a cause of her recurrent AKI.  I have consulted with hospitalist, Dr. Marylyn Ishihara has agreed to see and admit the patient.  Patient will need to be transferred from med center [facility] to receiving facility.  Patient stable at time of admission.  Final Clinical Impression(s) / ED Diagnoses Final diagnoses:  AKI (acute kidney injury) [facility])    Rx / Junior Orders ED Discharge Orders     None         Azucena Cecil, PA-C 05/14/21 1231    Jeanell Sparrow, DO 05/16/21 1300

## 2021-05-14 NOTE — Assessment & Plan Note (Addendum)
Continue Synthroid °

## 2021-05-14 NOTE — Assessment & Plan Note (Addendum)
Chronically on opiate therapy/opioid dependence -Resume fentanyl patch on discharge.  Continue as needed oxycodone.  Outpatient follow-up with PCP/pain management

## 2021-05-14 NOTE — Assessment & Plan Note (Addendum)
Hyperglycemia - A1c 10.3% - Carb modified diet.  Continue home regimen.  Outpatient follow-up with PCP.

## 2021-05-15 LAB — CBC WITH DIFFERENTIAL/PLATELET
Abs Immature Granulocytes: 0.01 10*3/uL (ref 0.00–0.07)
Basophils Absolute: 0 10*3/uL (ref 0.0–0.1)
Basophils Relative: 0 %
Eosinophils Absolute: 0 10*3/uL (ref 0.0–0.5)
Eosinophils Relative: 0 %
HCT: 38.6 % (ref 36.0–46.0)
Hemoglobin: 12.6 g/dL (ref 12.0–15.0)
Immature Granulocytes: 0 %
Lymphocytes Relative: 44 %
Lymphs Abs: 3.5 10*3/uL (ref 0.7–4.0)
MCH: 30.8 pg (ref 26.0–34.0)
MCHC: 32.6 g/dL (ref 30.0–36.0)
MCV: 94.4 fL (ref 80.0–100.0)
Monocytes Absolute: 0.7 10*3/uL (ref 0.1–1.0)
Monocytes Relative: 8 %
Neutro Abs: 3.8 10*3/uL (ref 1.7–7.7)
Neutrophils Relative %: 48 %
Platelets: 166 10*3/uL (ref 150–400)
RBC: 4.09 MIL/uL (ref 3.87–5.11)
RDW: 13.5 % (ref 11.5–15.5)
WBC: 8 10*3/uL (ref 4.0–10.5)
nRBC: 0 % (ref 0.0–0.2)

## 2021-05-15 LAB — MAGNESIUM: Magnesium: 1.6 mg/dL — ABNORMAL LOW (ref 1.7–2.4)

## 2021-05-15 LAB — COMPREHENSIVE METABOLIC PANEL
ALT: 33 U/L (ref 0–44)
AST: 35 U/L (ref 15–41)
Albumin: 3.5 g/dL (ref 3.5–5.0)
Alkaline Phosphatase: 93 U/L (ref 38–126)
Anion gap: 7 (ref 5–15)
BUN: 47 mg/dL — ABNORMAL HIGH (ref 6–20)
CO2: 22 mmol/L (ref 22–32)
Calcium: 8.2 mg/dL — ABNORMAL LOW (ref 8.9–10.3)
Chloride: 103 mmol/L (ref 98–111)
Creatinine, Ser: 2.69 mg/dL — ABNORMAL HIGH (ref 0.44–1.00)
GFR, Estimated: 21 mL/min — ABNORMAL LOW (ref >60)
Glucose, Bld: 265 mg/dL — ABNORMAL HIGH (ref 70–99)
Potassium: 3.9 mmol/L (ref 3.5–5.1)
Sodium: 132 mmol/L — ABNORMAL LOW (ref 135–145)
Total Bilirubin: 0.3 mg/dL (ref 0.3–1.2)
Total Protein: 6.1 g/dL — ABNORMAL LOW (ref 6.5–8.1)

## 2021-05-15 LAB — OSMOLALITY: Osmolality: 305 mOsm/kg — ABNORMAL HIGH (ref 275–295)

## 2021-05-15 LAB — OSMOLALITY, URINE: Osmolality, Ur: 334 mOsm/kg (ref 300–900)

## 2021-05-15 LAB — GLUCOSE, CAPILLARY
Glucose-Capillary: 319 mg/dL — ABNORMAL HIGH (ref 70–99)
Glucose-Capillary: 360 mg/dL — ABNORMAL HIGH (ref 70–99)
Glucose-Capillary: 360 mg/dL — ABNORMAL HIGH (ref 70–99)
Glucose-Capillary: 375 mg/dL — ABNORMAL HIGH (ref 70–99)

## 2021-05-15 LAB — TSH: TSH: 0.722 u[IU]/mL (ref 0.350–4.500)

## 2021-05-15 LAB — HIV ANTIBODY (ROUTINE TESTING W REFLEX): HIV Screen 4th Generation wRfx: NONREACTIVE

## 2021-05-15 LAB — CREATININE, URINE, RANDOM: Creatinine, Urine: 100.26 mg/dL

## 2021-05-15 LAB — HEMOGLOBIN A1C
Hgb A1c MFr Bld: 12.4 % — ABNORMAL HIGH (ref 4.8–5.6)
Mean Plasma Glucose: 309 mg/dL

## 2021-05-15 LAB — SODIUM, URINE, RANDOM: Sodium, Ur: 20 mmol/L

## 2021-05-15 MED ORDER — MAGNESIUM SULFATE 2 GM/50ML IV SOLN
2.0000 g | Freq: Once | INTRAVENOUS | Status: AC
  Administered 2021-05-15: 2 g via INTRAVENOUS
  Filled 2021-05-15: qty 50

## 2021-05-15 MED ORDER — SODIUM CHLORIDE 0.9 % IV SOLN: INTRAVENOUS | Status: DC

## 2021-05-15 MED ORDER — LEVOTHYROXINE SODIUM 50 MCG PO TABS
150.0000 ug | ORAL_TABLET | Freq: Every day | ORAL | Status: DC
  Administered 2021-05-16 – 2021-05-17 (×2): 150 ug via ORAL
  Filled 2021-05-15 (×2): qty 1

## 2021-05-15 NOTE — TOC Progression Note (Signed)
Transition of Care [facility]) - Progression Note    Patient Details  Name: [name] MRN: [id number] Date of Birth: 1970/10/23  Transition of Care [facility]) CM/SW Contact  Purcell Mouton, RN Phone Number: 05/15/2021, 3:57 PM  Clinical Narrative:      Transition of Care (TOC) Screening Note   Patient Details  Name: [name] Date of Birth: April 18, 1970   Transition of Care [facility]) CM/SW Contact:    Purcell Mouton, RN Phone Number: 05/15/2021, 3:57 PM    Transition of Care Department [facility]) has reviewed patient and no TOC needs have been identified at this time. We will continue to monitor patient advancement through interdisciplinary progression rounds. If new patient transition needs arise, please place a TOC consult.         Expected Discharge Plan and Services                                                 Social Determinants of Health (SDOH) Interventions    Readmission Risk Interventions No flowsheet data found.

## 2021-05-15 NOTE — Plan of Care (Signed)
  Problem: Clinical Measurements: Goal: Diagnostic test results will improve Outcome: Progressing   
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
## 2021-05-15 NOTE — Progress Notes (Signed)
Patient continues to refuse nocturnal CPAP. RN aware.

## 2021-05-15 NOTE — Progress Notes (Signed)
Care of patient assumed from  [facility]. Patient resting in bed. Resp even and unlabored. No complaints at this time will continue to monitor.

## 2021-05-15 NOTE — Progress Notes (Signed)
Inpatient Diabetes Program Recommendations  AACE/ADA: New Consensus Statement on Inpatient Glycemic Control (2015)  Target Ranges:  Prepandial:   less than 140 mg/dL      Peak postprandial:   less than 180 mg/dL (1-2 hours)      Critically ill patients:  140 - 180 mg/dL   Lab Results  Component Value Date   GLUCAP 360 (H) 05/15/2021   HGBA1C 10.3 (H) 12/26/2020    Review of Glycemic Control  Diabetes history: DM2 Outpatient Diabetes medications: Novolog 70/30 75-65-75 TID, Ozempic 0.5 mg weekly, Januvia 50 mg QD Current orders for Inpatient glycemic control: Semglee 10 QD, Novolog 0-15 units TID with meals and 0-5 HS  HgbA1C - 10.3% Eating 100%  Inpatient Diabetes Program Recommendations:    Increase Semglee to 36 units BID Add Novolog 12 units TID with meals if eating > 50%  Spoke with pt at bedside regarding her diabetes and HgbA1C of 10.3%. Pt states she has been eating a lot of sweets, ice cream, etc, as her BF brings her "unhealthy food." Has just recently moved out of BF's house and back in her own home, to focus on taking care of herself. Wants to eat healthier and try to move around as much as possible, with back issues. Checks blood sugars at home and pt states she knows what she needs to do. Said she's lost weight before and knows she can do it, because she wants to live. Gave support and encouragement to take care of herself.  Discussed above with RN.  Will f/u and watch glucose trends while inpatient.   Thank you. Lorenda Peck, RD, LDN, CDE Inpatient Diabetes Coordinator [phone]

## 2021-05-15 NOTE — Hospital Course (Addendum)
51 yo female with PMH of DMII, HTN, obesity, chronic pain syndrome, seizure disorder, renal failure in January 2022 requiring brief dialysis/CRRT (possibly due to polypharmacy, notably Celebrex and Neurontin) presented with decreased urine output.  On presentation, creatinine was 3.99, BUN 43. CT renal stone study showed no acute findings, specifically no obstructive uropathy.  She was started on IV fluids.  During the hospitalization, her condition has improved.  Creatinine has normalized.  Foley catheter will be removed today for voiding trial.  If she goes back into urinary retention, she will have a Foley catheter placed again and she will need outpatient urology follow-up.  She will be discharged home today.

## 2021-05-15 NOTE — Progress Notes (Signed)
Progress Note   Patient: [name] ENI:[id number] DOB: September 08, 1970 DOA: 05/14/2021     1 DOS: the patient was seen and examined on 05/15/2021   Brief hospital course: [name] is a 51 yo female with PMH DMII, HTN, obesity, chronic pain syndrome, hx seizure d/o who presented with decreased urine output since approximately Saturday.  She has a history of requiring dialysis from renal failure in Jan 2022 (briefly on CRRT < 12 hours).  In the past this was felt due to contribution of polypharmacy notably celebrex and neurontin.  She tried remaining hydrated however due to ongoing decreased urine output, she finally presented for further evaluation. It was reported that she has since been resumed on Neurontin approximately October 2022. Creatinine on admission noted to be 3.99, BUN 43. CT renal stone study showed no acute findings, specifically no obstructive uropathy. Most of her home medications were held, she was started on fluids and admitted for further monitoring.  Assessment and Plan: * AKI (acute kidney injury) ([place])- (present on admission) - UA relatively unremarkable on admission -In the past she was told that Celebrex may have contributed to her renal failure in January 2022.  She also required renal adjustment of Neurontin.  [name] was also discontinued at that time -Again presents with concern for possible polypharmacy contributing. - Other etiology may be some volume depletion and fluids are reasonable for now - Check urine studies -If no significant improvement or worsening renal function, would consult nephrology while inpatient. May need renal biopsy if etiology ambiguous -Renally dose medications as needed and resume home meds slowly -Continue Foley for now  Oliguria and anuria- (present on admission) Continue IVF and foley catheter -See AKI  Chronically on opiate therapy Will temporarily stop fentanyl patch. Pt states she is down from a high of 400 mcg/hr. She has  slowly tapered down over time.  Seizure disorder ([name]) Continue keppra. No recent seizures.  Chronic pain- (present on admission) - Fentanyl patch held on admission - As needed oxycodone ordered  Hypothyroidism- (present on admission) - Check TSH - Resume home Synthroid  Sleep apnea- (present on admission) Continue CPAP.  Controlled type 2 diabetes mellitus without complication, without long-term current use of insulin (HCC) - A1c 10.3% - Continue Semglee and SSI for now but caution with renal dysfunction as insulin accumulation is expected  Morbidly obese ([place])- (present on admission) Chronic.   Subjective: Resting in bed comfortably this morning.  Her biggest complaint was lethargy on admission which is somewhat improved when seen. She also had concerned about a spot on the bottom of her right foot; I evaluated this and reassured her it appeared to be a callus with no surrounding signs of infection.   Physical Exam: Vitals:   05/15/21 0500 05/15/21 0607 05/15/21 0706 05/15/21 1010  BP:  (!) 94/52 98/70 114/90  Pulse:  85 88 84  Resp:  16  16  Temp:  98.1 F (36.7 C)  98.1 F (36.7 C)  TempSrc:  Oral  Oral  SpO2:  98%  97%  Weight: (!) 158.2 kg     Height:      Physical Exam Constitutional:      Appearance: Normal appearance. She is obese.  HENT:     Head: Normocephalic and atraumatic.     Mouth/Throat:     Mouth: Mucous membranes are moist.  Eyes:     Extraocular Movements: Extraocular movements intact.  Cardiovascular:     Rate and Rhythm: Normal rate and regular  rhythm.  Pulmonary:     Effort: Pulmonary effort is normal.     Breath sounds: Normal breath sounds.  Abdominal:     General: Bowel sounds are normal. There is no distension.     Palpations: Abdomen is soft.     Tenderness: There is no abdominal tenderness.  Musculoskeletal:        General: Normal range of motion.     Cervical back: Normal range of motion and neck supple.  Feet:     Comments:  Small callus noted on sole of right foot Skin:    General: Skin is warm and dry.  Neurological:     General: No focal deficit present.     Mental Status: She is alert.  Psychiatric:        Mood and Affect: Mood normal.        Behavior: Behavior normal.     Data Reviewed:  Results for orders placed or performed during the hospital encounter of 05/14/21 (from the past 24 hour(s))  Urinalysis, Routine w reflex microscopic Urine, Catheterized     Status: Abnormal   Collection Time: 05/14/21  1:13 PM  Result Value Ref Range   Color, Urine YELLOW YELLOW   APPearance CLEAR CLEAR   Specific Gravity, Urine >1.030 (H) 1.005 - 1.030   pH 5.0 5.0 - 8.0   Glucose, UA NEGATIVE NEGATIVE mg/dL   Hgb urine dipstick NEGATIVE NEGATIVE   Bilirubin Urine MODERATE (A) NEGATIVE   Ketones, ur 15 (A) NEGATIVE mg/dL   Protein, ur NEGATIVE NEGATIVE mg/dL   Nitrite NEGATIVE NEGATIVE   Leukocytes,Ua NEGATIVE NEGATIVE  POC CBG, ED     Status: Abnormal   Collection Time: 05/14/21  3:57 PM  Result Value Ref Range   Glucose-Capillary 312 (H) 70 - 99 mg/dL  Glucose, capillary     Status: Abnormal   Collection Time: 05/14/21  9:09 PM  Result Value Ref Range   Glucose-Capillary 254 (H) 70 - 99 mg/dL  CBC with Differential/Platelet     Status: None   Collection Time: 05/15/21  4:07 AM  Result Value Ref Range   WBC 8.0 4.0 - 10.5 K/uL   RBC 4.09 3.87 - 5.11 MIL/uL   Hemoglobin 12.6 12.0 - 15.0 g/dL   HCT 38.6 36.0 - 46.0 %   MCV 94.4 80.0 - 100.0 fL   MCH 30.8 26.0 - 34.0 pg   MCHC 32.6 30.0 - 36.0 g/dL   RDW 13.5 11.5 - 15.5 %   Platelets 166 150 - 400 K/uL   nRBC 0.0 0.0 - 0.2 %   Neutrophils Relative % 48 %   Neutro Abs 3.8 1.7 - 7.7 K/uL   Lymphocytes Relative 44 %   Lymphs Abs 3.5 0.7 - 4.0 K/uL   Monocytes Relative 8 %   Monocytes Absolute 0.7 0.1 - 1.0 K/uL   Eosinophils Relative 0 %   Eosinophils Absolute 0.0 0.0 - 0.5 K/uL   Basophils Relative 0 %   Basophils Absolute 0.0 0.0 - 0.1 K/uL    Immature Granulocytes 0 %   Abs Immature Granulocytes 0.01 0.00 - 0.07 K/uL  Comprehensive metabolic panel     Status: Abnormal   Collection Time: 05/15/21  4:07 AM  Result Value Ref Range   Sodium 132 (L) 135 - 145 mmol/L   Potassium 3.9 3.5 - 5.1 mmol/L   Chloride 103 98 - 111 mmol/L   CO2 22 22 - 32 mmol/L   Glucose, Bld 265 (H) 70 - 99  mg/dL   BUN 47 (H) 6 - 20 mg/dL   Creatinine, Ser 2.69 (H) 0.44 - 1.00 mg/dL   Calcium 8.2 (L) 8.9 - 10.3 mg/dL   Total Protein 6.1 (L) 6.5 - 8.1 g/dL   Albumin 3.5 3.5 - 5.0 g/dL   AST 35 15 - 41 U/L   ALT 33 0 - 44 U/L   Alkaline Phosphatase 93 38 - 126 U/L   Total Bilirubin 0.3 0.3 - 1.2 mg/dL   GFR, Estimated 21 (L) >60 mL/min   Anion gap 7 5 - 15  Magnesium     Status: Abnormal   Collection Time: 05/15/21  4:07 AM  Result Value Ref Range   Magnesium 1.6 (L) 1.7 - 2.4 mg/dL  HIV Antibody (routine testing w rflx)     Status: None   Collection Time: 05/15/21  4:07 AM  Result Value Ref Range   HIV Screen 4th Generation wRfx Non Reactive Non Reactive  Glucose, capillary     Status: Abnormal   Collection Time: 05/15/21  8:10 AM  Result Value Ref Range   Glucose-Capillary 319 (H) 70 - 99 mg/dL  Osmolality     Status: Abnormal   Collection Time: 05/15/21  8:43 AM  Result Value Ref Range   Osmolality 305 (H) 275 - 295 mOsm/kg  Glucose, capillary     Status: Abnormal   Collection Time: 05/15/21 11:40 AM  Result Value Ref Range   Glucose-Capillary 360 (H) 70 - 99 mg/dL    I have Reviewed nursing notes, Vitals, and Lab results since pt's last encounter. Pertinent lab results : see above I have ordered test including BMP, CBC, Mg I have reviewed the last note from staff over past 24 hours I have discussed pt's care plan and test results with nursing staff, case manager   Family Communication:   Disposition: Status is: Inpatient Remains inpatient appropriate because: Treatment as outlined in A&P   Planned Discharge Destination:  Home  Antimicrobials:   Consultants:   Procedures:    DVT ppx:  heparin injection 5,000 Units Start: 05/14/21 2200 SCDs Start: 05/14/21 2124     Code Status: Full Code     Author: Dwyane Dee, MD 05/15/2021 12:49 PM  For on call review [email or web address].

## 2021-05-16 ENCOUNTER — Ambulatory Visit: Payer: Medicare HMO | Admitting: Orthopaedic Surgery

## 2021-05-16 DIAGNOSIS — E875 Hyperkalemia: Secondary | ICD-10-CM

## 2021-05-16 DIAGNOSIS — G8929 Other chronic pain: Secondary | ICD-10-CM

## 2021-05-16 DIAGNOSIS — G40909 Epilepsy, unspecified, not intractable, without status epilepticus: Secondary | ICD-10-CM

## 2021-05-16 DIAGNOSIS — E871 Hypo-osmolality and hyponatremia: Secondary | ICD-10-CM

## 2021-05-16 DIAGNOSIS — R5381 Other malaise: Secondary | ICD-10-CM

## 2021-05-16 LAB — GLUCOSE, CAPILLARY
Glucose-Capillary: 361 mg/dL — ABNORMAL HIGH (ref 70–99)
Glucose-Capillary: 401 mg/dL — ABNORMAL HIGH (ref 70–99)
Glucose-Capillary: 304 mg/dL — ABNORMAL HIGH (ref 70–99)
Glucose-Capillary: 206 mg/dL — ABNORMAL HIGH (ref 70–99)

## 2021-05-16 LAB — BASIC METABOLIC PANEL
Anion gap: 4 — ABNORMAL LOW (ref 5–15)
BUN: 37 mg/dL — ABNORMAL HIGH (ref 6–20)
CO2: 23 mmol/L (ref 22–32)
Calcium: 8.3 mg/dL — ABNORMAL LOW (ref 8.9–10.3)
Chloride: 106 mmol/L (ref 98–111)
Creatinine, Ser: 1.43 mg/dL — ABNORMAL HIGH (ref 0.44–1.00)
GFR, Estimated: 44 mL/min — ABNORMAL LOW (ref >60)
Glucose, Bld: 393 mg/dL — ABNORMAL HIGH (ref 70–99)
Potassium: 5.2 mmol/L — ABNORMAL HIGH (ref 3.5–5.1)
Sodium: 133 mmol/L — ABNORMAL LOW (ref 135–145)

## 2021-05-16 LAB — CBC WITH DIFFERENTIAL/PLATELET
Abs Immature Granulocytes: 0.01 10*3/uL (ref 0.00–0.07)
Basophils Absolute: 0 10*3/uL (ref 0.0–0.1)
Basophils Relative: 0 %
Eosinophils Absolute: 0 10*3/uL (ref 0.0–0.5)
Eosinophils Relative: 0 %
HCT: 39.9 % (ref 36.0–46.0)
Hemoglobin: 12.4 g/dL (ref 12.0–15.0)
Immature Granulocytes: 0 %
Lymphocytes Relative: 42 %
Lymphs Abs: 2 10*3/uL (ref 0.7–4.0)
MCH: 30.2 pg (ref 26.0–34.0)
MCHC: 31.1 g/dL (ref 30.0–36.0)
MCV: 97.1 fL (ref 80.0–100.0)
Monocytes Absolute: 0.5 10*3/uL (ref 0.1–1.0)
Monocytes Relative: 11 %
Neutro Abs: 2.2 10*3/uL (ref 1.7–7.7)
Neutrophils Relative %: 47 %
Platelets: 142 10*3/uL — ABNORMAL LOW (ref 150–400)
RBC: 4.11 MIL/uL (ref 3.87–5.11)
RDW: 13.3 % (ref 11.5–15.5)
WBC: 4.8 10*3/uL (ref 4.0–10.5)
nRBC: 0 % (ref 0.0–0.2)

## 2021-05-16 LAB — MAGNESIUM: Magnesium: 2.1 mg/dL (ref 1.7–2.4)

## 2021-05-16 LAB — GLUCOSE, RANDOM: Glucose, Bld: 413 mg/dL — ABNORMAL HIGH (ref 70–99)

## 2021-05-16 MED ORDER — INSULIN ASPART 100 UNIT/ML IJ SOLN: 20.0000 [IU] | Freq: Once | INTRAMUSCULAR | Status: DC

## 2021-05-16 MED ORDER — OXYCODONE HCL 5 MG PO TABS
5.0000 mg | ORAL_TABLET | Freq: Four times a day (QID) | ORAL | Status: DC | PRN
  Administered 2021-05-16 – 2021-05-17 (×3): 10 mg via ORAL
  Filled 2021-05-16 (×3): qty 2

## 2021-05-16 MED ORDER — INSULIN GLARGINE-YFGN 100 UNIT/ML ~~LOC~~ SOLN
20.0000 [IU] | Freq: Two times a day (BID) | SUBCUTANEOUS | Status: DC
  Administered 2021-05-16 – 2021-05-17 (×2): 20 [IU] via SUBCUTANEOUS
  Filled 2021-05-16 (×3): qty 0.2

## 2021-05-16 MED ORDER — INSULIN GLARGINE-YFGN 100 UNIT/ML ~~LOC~~ SOLN
20.0000 [IU] | Freq: Every day | SUBCUTANEOUS | Status: DC
  Administered 2021-05-16: 20 [IU] via SUBCUTANEOUS
  Filled 2021-05-16: qty 0.2

## 2021-05-16 MED ORDER — INSULIN ASPART 100 UNIT/ML IJ SOLN
7.0000 [IU] | Freq: Three times a day (TID) | INTRAMUSCULAR | Status: DC
  Administered 2021-05-16 – 2021-05-17 (×3): 7 [IU] via SUBCUTANEOUS

## 2021-05-16 NOTE — Progress Notes (Signed)
Progress Note   Patient: [name] ZHG:[id number] DOB: 1970-07-12 DOA: 05/14/2021     2 DOS: the patient was seen and examined on 05/16/2021   Brief hospital course: 51 yo female with PMH of DMII, HTN, obesity, chronic pain syndrome, seizure disorder, renal failure in January 2022 requiring brief dialysis/CRRT (possibly due to polypharmacy, notably Celebrex and Neurontin) presented with decreased urine output.  On presentation, creatinine was 3.99, BUN 43. CT renal stone study showed no acute findings, specifically no obstructive uropathy.  She was started on IV fluids.   Assessment and Plan: * AKI (acute kidney injury) ([name])- (present on admission) - UA relatively unremarkable on admission -In the past she was told that Celebrex may have contributed to her renal failure in January 2022.  She also required renal adjustment of Neurontin.  [name] was also discontinued at that time -Again presents with concern for possible polypharmacy contributing to renal failure along with possible volume depletion. - CT renal stone study was unremarkable -Presented with creatinine of 3.99.  Treated with IV fluids.  Creatinine improving to 1.43 today.  Decrease IV fluids to 75 cc an hour.  Repeat a.m. creatinine. -Avoid nephrotoxic medications.  Chronic pain- (present on admission) Chronically on opiate therapy --Fentanyl patch on hold.  Continue as needed oxycodone.  Outpatient follow-up with PCP/pain management  Hyponatremia - Mild.  IV fluids as above.  Repeat a.m. labs.  Seizure disorder ([name]) -Continue keppra. No recent seizures.  Hyperkalemia - Mild.  Repeat a.m. labs.  Hypothyroidism- (present on admission) -Continue Synthroid  Controlled type 2 diabetes mellitus without complication, without long-term current use of insulin (HCC) Hyperglycemia - A1c 10.3% - Increase dose of long-acting insulin.  Add NovoLog with meals.  Continue CBGs with SSI.  Sleep apnea- (present on  admission) -Continue CPAP.  Morbidly obese ([name])- (present on admission) - Outpatient follow-up  Physical deconditioning - PT eval        Subjective:  Patient seen and examined at bedside.  Still feels weak and tired.  Does not feel ready to go home today.  Complains of back pain.  No overnight fever, nausea or vomiting reported.  Physical Exam: Vitals:   05/15/21 1312 05/15/21 2026 05/16/21 0000 05/16/21 0626  BP: (!) 135/92 117/65  132/68  Pulse: 84 86  78  Resp:  20 17 20   Temp: 97.9 F (36.6 C) 98.2 F (36.8 C)  98 F (36.7 C)  TempSrc: Oral Oral  Oral  SpO2: 99% 93%  100%  Weight:    (!) 159.9 kg  Height:       General: No acute distress, currently on room air.  Looks chronically ill and deconditioned. ENT/neck: No elevated JVD.  No obvious masses  respiratory: Bilateral decreased breath sounds at bases with some scattered crackles CVS: S1-S2 heard, rate controlled Abdominal: Soft, morbidly obese, nontender, nondistended, no organomegaly, bowel sounds heard Extremities: No cyanosis, clubbing; trace lower extremity edema CNS: Alert, awake and oriented.  No focal neurologic deficit.  Moving extremities. Lymph: No cervical lymphadenopathy Skin: No rashes, lesions, ulcers Psych: Affect is mostly flat.  No signs of agitation currently.  Musculoskeletal: No obvious joint deformity/tenderness/swelling   Data Reviewed: I reviewed patient's investigations since admission myself including this morning's labs: Sodium 133, potassium 5.2, creatinine 1.43, blood sugars in the 300s, WBC of 4.8  Family Communication: None at bedside  Disposition: Status is: Inpatient Remains inpatient appropriate because: Of need for IV fluids     Planned Discharge Destination: Home  Time spent: 50 minutes  Author: Aline August, MD 05/16/2021 10:39 AM  For on call review [email or web address].

## 2021-05-16 NOTE — Evaluation (Signed)
Physical Therapy Evaluation Patient Details Name: [name] MRN: [id number] DOB: Feb 06, 1971 Today's Date: 05/16/2021  History of Present Illness  Pt is a 51 yo female who presents with decreased urine output; admitted 2/20 with AKI. PMH: diabetes, HTN, obesity, chronic pain syndrome, seizure disorder, renal failure in January 2022 requiring brief dialysis/CRRT, fibromyalgia   Clinical Impression  Pt admitted with above diagnosis. Pt was ind using quad cane in the home and electric scooter at grocery store, has R AFO for foot drop since 2003, ind with self care, had boyfriend assisting with household chores but recently broke up. Pt mobility around room with min guard-supv for safety, using RW due to no R AFO at hospital currently. Pt with sad mood, crying at times regarding bil knee pain and needing knee replacements, recently broke up with boyfriend- pt agreeable with PT notifying nurse and also reports chaplain is coming again today which makes pt happy. Recommending OPPT for bil knee and chronic back pain. Pt currently with functional limitations due to the deficits listed below (see PT Problem List). Pt will benefit from skilled PT to increase their independence and safety with mobility to allow discharge to the venue listed below.          Recommendations for follow up therapy are one component of a multi-disciplinary discharge planning process, led by the attending physician.  Recommendations may be updated based on patient status, additional functional criteria and insurance authorization.  Follow Up Recommendations Outpatient PT    Assistance Recommended at Discharge PRN  Patient can return home with the following  Assistance with cooking/housework;Help with stairs or ramp for entrance    Equipment Recommendations None recommended by PT  Recommendations for Other Services       Functional Status Assessment Patient has had a recent decline in their functional status and  demonstrates the ability to make significant improvements in function in a reasonable and predictable amount of time.     Precautions / Restrictions Precautions Precautions: Fall Required Braces or Orthoses: Other Brace Other Brace: R AFO for foot drop Restrictions Weight Bearing Restrictions: No      Mobility  Bed Mobility Overal bed mobility: Modified Independent  General bed mobility comments: therapist managing foley catheter    Transfers Overall transfer level: Needs assistance Equipment used: Rolling walker (2 wheels) Transfers: Sit to/from Stand Sit to Stand: Supervision  General transfer comment: powers to stand with BUE assisting, initial dizziness that quickly resolves, no unsteadiness    Ambulation/Gait Ambulation/Gait assistance: Min guard Gait Distance (Feet): 40 Feet Assistive device: Rolling walker (2 wheels) Gait Pattern/deviations: Step-through pattern, Decreased stride length, Wide base of support Gait velocity: decreased  General Gait Details: step through pattern with RW, wide BOS, limited by bil knee pain complaints without knee buckling noted and LBP complaints, no R AFO for foot drop in room but able to compensate appropriately without overt LOB  Stairs            Wheelchair Mobility    Modified Rankin (Stroke Patients Only)       Balance Overall balance assessment: Needs assistance Sitting-balance support: Feet supported Sitting balance-Leahy Scale: Good  Standing balance support: During functional activity, Bilateral upper extremity supported Standing balance-Leahy Scale: Fair       Pertinent Vitals/Pain Pain Assessment Pain Assessment: 0-10 Pain Score: 7  Pain Location: low back, bil knees Pain Descriptors / Indicators: Sore, Discomfort Pain Intervention(s): Limited activity within patient's tolerance, Monitored during session, Premedicated before session, Repositioned (pt reports  normally wears fentyl patch for pain)    Home  Living Family/patient expects to be discharged to:: Private residence Living Arrangements: Alone Available Help at Discharge: Friend(s);Available PRN/intermittently Type of Home: Mobile home Home Access: Stairs to enter Entrance Stairs-Rails: Right;Left Entrance Stairs-Number of Steps: 3   Home Layout: One level Home Equipment: Conservation officer, nature (2 wheels);Cane - quad;Shower seat;Tub bench;Wheelchair Probation officer (4 wheels);Other (comment) (R AFO for drop foot since 2003)      Prior Function Prior Level of Function : Independent/Modified Independent;Driving  Mobility Comments: pt reports using quad cane in the home, using electric scooter at grocery store or w/c for community distances ADLs Comments: pt reports requires some assist with cooking, house cleaning at baseline from boyfriend but recently broke up; pt reports ind with self care tasks     Hand Dominance   Dominant Hand: Right    Extremity/Trunk Assessment   Upper Extremity Assessment Upper Extremity Assessment: Overall WFL for tasks assessed    Lower Extremity Assessment Lower Extremity Assessment: Overall WFL for tasks assessed    Cervical / Trunk Assessment Cervical / Trunk Assessment: Normal  Communication   Communication: No difficulties  Cognition Arousal/Alertness: Awake/alert Behavior During Therapy: WFL for tasks assessed/performed Overall Cognitive Status: Within Functional Limits for tasks assessed    General Comments      Exercises     Assessment/Plan    PT Assessment Patient needs continued PT services  PT Problem List Decreased activity tolerance;Decreased balance;Obesity;Pain       PT Treatment Interventions DME instruction;Gait training;Functional mobility training;Therapeutic activities;Therapeutic exercise;Balance training;Stair training;Patient/family education    PT Goals (Current goals can be found in the Care Plan section)  Acute Rehab PT Goals Patient Stated Goal: lose weight  and get knees replaced PT Goal Formulation: With patient Time For Goal Achievement: 05/30/21 Potential to Achieve Goals: Good    Frequency Min 3X/week     Co-evaluation               AM-PAC PT "6 Clicks" Mobility  Outcome Measure Help needed turning from your back to your side while in a flat bed without using bedrails?: None Help needed moving from lying on your back to sitting on the side of a flat bed without using bedrails?: None Help needed moving to and from a bed to a chair (including a wheelchair)?: A Little Help needed standing up from a chair using your arms (e.g., wheelchair or bedside chair)?: A Little Help needed to walk in hospital room?: A Little Help needed climbing 3-5 steps with a railing? : A Little 6 Click Score: 20    End of Session   Activity Tolerance: Patient tolerated treatment well Patient left: in bed;with call bell/phone within reach;with bed alarm set Nurse Communication: Mobility status;Other (comment) (sad mood, skin integrity R groin/hip) PT Visit Diagnosis: Other abnormalities of gait and mobility (R26.89);Pain Pain - part of body: Knee    Time: 4818-5909 PT Time Calculation (min) (ACUTE ONLY): 35 min   Charges:   PT Evaluation $PT Eval Low Complexity: 1 Low PT Treatments $Gait Training: 8-22 mins         Tori Tharon Bomar PT, DPT 05/16/21, 3:11 PM

## 2021-05-16 NOTE — Assessment & Plan Note (Addendum)
Resolved

## 2021-05-16 NOTE — Assessment & Plan Note (Addendum)
-   Mild.  Treated with IV fluids.  Resolved.

## 2021-05-16 NOTE — Progress Notes (Signed)
Patient refused cpap 

## 2021-05-16 NOTE — Plan of Care (Signed)
  Problem: Clinical Measurements: Goal: Cardiovascular complication will be avoided Outcome: Progressing   Problem: Nutrition: Goal: Adequate nutrition will be maintained Outcome: Progressing   

## 2021-05-16 NOTE — Assessment & Plan Note (Addendum)
-   Will need outpatient PT.

## 2021-05-17 DIAGNOSIS — R338 Other retention of urine: Secondary | ICD-10-CM

## 2021-05-17 DIAGNOSIS — R339 Retention of urine, unspecified: Secondary | ICD-10-CM

## 2021-05-17 LAB — BASIC METABOLIC PANEL
Anion gap: 3 — ABNORMAL LOW (ref 5–15)
BUN: 22 mg/dL — ABNORMAL HIGH (ref 6–20)
CO2: 26 mmol/L (ref 22–32)
Calcium: 8.9 mg/dL (ref 8.9–10.3)
Chloride: 107 mmol/L (ref 98–111)
Creatinine, Ser: 0.95 mg/dL (ref 0.44–1.00)
GFR, Estimated: >60 mL/min (ref >60)
Glucose, Bld: 214 mg/dL — ABNORMAL HIGH (ref 70–99)
Potassium: 4.3 mmol/L (ref 3.5–5.1)
Sodium: 136 mmol/L (ref 135–145)

## 2021-05-17 LAB — CYTOLOGY - NON PAP

## 2021-05-17 LAB — MAGNESIUM: Magnesium: 1.8 mg/dL (ref 1.7–2.4)

## 2021-05-17 LAB — GLUCOSE, CAPILLARY: Glucose-Capillary: 239 mg/dL — ABNORMAL HIGH (ref 70–99)

## 2021-05-17 NOTE — TOC Progression Note (Signed)
Transition of Care [facility]) - Progression Note    Patient Details  Name: [name] MRN: [id number] Date of Birth: 17-Aug-1970  [facility]) CM/SW Contact  Purcell Mouton, RN Phone Number: 05/17/2021, 1:02 PM  Clinical Narrative:     I spoke with pt and her brother [name], on the phone. Pt is not trying to stay here. She wants to be able to urinate before leaving. She has not urinated.        Expected Discharge Plan and Services           Expected Discharge Date: 05/17/21                                     Social Determinants of Health (SDOH) Interventions    Readmission Risk Interventions No flowsheet data found.

## 2021-05-17 NOTE — Plan of Care (Signed)
°  Problem: Education: Goal: Knowledge of General Education information will improve Description: Including pain rating scale, medication(s)/side effects and non-pharmacologic comfort measures Outcome: Adequate for Discharge   Problem: Health Behavior/Discharge Planning: Goal: Ability to manage health-related needs will improve Outcome: Adequate for Discharge   Problem: Clinical Measurements: Goal: Ability to maintain clinical measurements within normal limits will improve Outcome: Adequate for Discharge Goal: Will remain free from infection Outcome: Adequate for Discharge Goal: Diagnostic test results will improve Outcome: Adequate for Discharge Goal: Respiratory complications will improve 05/17/2021 1151 by Jerene Pitch, RN Outcome: Adequate for Discharge 05/17/2021 1150 by Jerene Pitch, RN Outcome: Progressing Goal: Cardiovascular complication will be avoided 05/17/2021 1151 by Jerene Pitch, RN Outcome: Adequate for Discharge 05/17/2021 1150 by Jerene Pitch, RN Outcome: Progressing   Problem: Activity: Goal: Risk for activity intolerance will decrease 05/17/2021 1151 by Jerene Pitch, RN Outcome: Adequate for Discharge 05/17/2021 1150 by Jerene Pitch, RN Outcome: Progressing   Problem: Nutrition: Goal: Adequate nutrition will be maintained Outcome: Adequate for Discharge   Problem: Coping: Goal: Level of anxiety will decrease Outcome: Adequate for Discharge   Problem: Elimination: Goal: Will not experience complications related to bowel motility Outcome: Adequate for Discharge Goal: Will not experience complications related to urinary retention Outcome: Adequate for Discharge   Problem: Pain Managment: Goal: General experience of comfort will improve Outcome: Adequate for Discharge   Problem: Safety: Goal: Ability to remain free from injury will improve Outcome: Adequate for Discharge   Problem: Skin Integrity: Goal: Risk for impaired skin  integrity will decrease Outcome: Adequate for Discharge

## 2021-05-17 NOTE — Discharge Summary (Signed)
Physician Discharge Summary   Patient: [name] MRN: [id number] DOB: 05-16-1970  Admit date:     05/14/2021  Discharge date: 05/17/21  Discharge Physician: Aline August   PCP: Pecolia Ades, NP   Recommendations at discharge:   Follow-up with PCP within a week with repeat BMP Outpatient follow-up with PCP/pain management for her chronic pain Follow-up in the ED if symptoms worsen or new appear   Hospital Course: 51 yo female with PMH of DMII, HTN, obesity, chronic pain syndrome, seizure disorder, renal failure in January 2022 requiring brief dialysis/CRRT (possibly due to polypharmacy, notably Celebrex and Neurontin) presented with decreased urine output.  On presentation, creatinine was 3.99, BUN 43. CT renal stone study showed no acute findings, specifically no obstructive uropathy.  She was started on IV fluids.  During the hospitalization, her condition has improved.  Creatinine has normalized.  Foley catheter will be removed today for voiding trial.  If she goes back into urinary retention, she will have a Foley catheter placed again and she will need outpatient urology follow-up.  She will be discharged home today.   Discharge diagnosis and assessment and Plan:   Consultants: None Procedures performed: None Disposition: Home Diet recommendation:  Discharge Diet Orders (From admission, onward)     Start     Ordered   05/17/21 0000  Diet - low sodium heart healthy        05/17/21 1029   05/17/21 0000  Diet Carb Modified        05/17/21 1029           Cardiac and Carb modified diet  DISCHARGE MEDICATION: Allergies as of 05/17/2021       Reactions   Penicillins Hives, Other (See Comments)   HAS PT DEVELOPED SEVERE RASH INVOLVING MUCUS MEMBRANES or SKIN NECROSIS: #  #  YES  #  # PATIENT HAS HAD A PCN REACTION THAT REQUIRED HOSPITALIZATION:  #  #  YES  #  #  Tolerates amoxicillin on multiple occasions per Dr. Darrel Hoover note 11/01/17.  TDD.    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    Sulfa Antibiotics Hives   Dulaglutide Other (See Comments)   unknown   No [facility] Food Allergies Hives   Chicken   Versed [midazolam] Nausea And Vomiting   Pt had medication on October 16 2017 with no issues        Medication List     STOP taking these medications    pantoprazole 40 MG tablet Commonly known as: PROTONIX   Trulicity 3 TO/6.7TI Sopn Generic drug: Dulaglutide       TAKE these medications    acetaminophen 325 MG tablet Commonly known as: TYLENOL Take 2 tablets (650 mg total) by mouth every 6 (six) hours as needed for mild pain (or Fever >/= 101).   amitriptyline 25 MG tablet Commonly known as: ELAVIL Take 1 tablet (25 mg total) by mouth at bedtime.   Aspirin Low Dose 81 MG EC tablet Generic drug: aspirin Take 243 mg by mouth daily.   atorvastatin 20 MG tablet Commonly known as: LIPITOR Take 20 mg by mouth daily.   Biotin 10000 MCG Tabs Take 10,000 mcg by mouth daily.   carisoprodol 350 MG tablet Commonly known as: SOMA Take 350 mg by mouth 4 (four) times daily as needed.   cetirizine 10 MG tablet Commonly known as: ZYRTEC Take 10 mg by  mouth daily as needed for allergies.   Diclofenac Sodium 2 % Soln Place 2-4 g onto the skin 4 (four) times daily as needed. What changed:  how much to take reasons to take this   DULoxetine 60 MG capsule Commonly known as: CYMBALTA Take 1 capsule (60 mg total) by mouth 2 (two) times daily.   fentaNYL 50 MCG/HR Commonly known as: [place] 1 patch onto the skin every 3 (three) days.   gabapentin 600 MG tablet Commonly known as: NEURONTIN Take 0.5 tablets (300 mg total) by mouth 3 (three) times daily.   levETIRAcetam 750 MG tablet Commonly known as: KEPPRA Take 750 mg by mouth 2 (two) times daily.   levothyroxine 150 MCG tablet Commonly known as: SYNTHROID Take  150 mcg by mouth daily before breakfast.   lidocaine 5 % Commonly known as: LIDODERM Place 3 patches onto the skin See admin instructions. Place 3 patches daily to affected areas and replace every 24 hours   Narcan 4 MG/0.1ML Liqd nasal spray kit Generic drug: naloxone Place 4 mg into the nose daily as needed (opioid overdose).   NexIUM 40 MG capsule Generic drug: esomeprazole Take 40 mg by mouth daily.   NovoLOG Mix 70/30 FlexPen (70-30) 100 UNIT/ML FlexPen Generic drug: insulin aspart protamine - aspart Inject 75 Units into the skin in the morning, at noon, and at bedtime.   nystatin powder Commonly known as: MYCOSTATIN/NYSTOP Apply 1 application topically 3 (three) times daily.   omega-3 acid ethyl esters 1 g capsule Commonly known as: LOVAZA Take 1 capsule by mouth 2 (two) times daily.   Oxycodone HCl 10 MG Tabs Take 1 tablet (10 mg total) by mouth every 6 (six) hours as needed (for pain).   Ozempic (0.25 or 0.5 MG/DOSE) 2 MG/1.5ML Sopn Generic drug: Semaglutide(0.25 or 0.5MG/DOS) Inject 0.5 mg into the skin every Thursday.   senna 8.6 MG Tabs tablet Commonly known as: SENOKOT Take 8.6 mg by mouth daily as needed for mild constipation.   sitaGLIPtin 50 MG tablet Commonly known as: JANUVIA Take 50 mg by mouth daily.   Symbicort 160-4.5 MCG/ACT inhaler Generic drug: budesonide-formoterol Inhale 2 puffs into the lungs 2 (two) times daily as needed for shortness of breath or wheezing.   triamcinolone cream 0.1 % Commonly known as: KENALOG Apply 1 application topically 2 (two) times daily as needed (skin rash).   Vitamin D3 50 MCG (2000 UT) Tabs Take 1 tablet by mouth daily.   Vitamin Deficiency System-B12 1000 MCG/ML Kit Generic drug: Cyanocobalamin Inject 1,000 mcg as directed every 30 (thirty) days.        Follow-up Information     Regan, Ripley Fraise, NP. Schedule an appointment as soon as possible for a visit in 1 week(s).   Specialty: Adult Health  Nurse Practitioner Why: with repeat bmp Contact information: [address] [place] [address] [phone]                Subjective: Patient seen and examined at bedside.  Complains of back pain.  Denies worsening fever, nausea, vomiting or shortness of breath. Discharge Exam: Filed Weights   05/15/21 0500 05/16/21 0626 05/17/21 0534  Weight: (!) 158.2 kg (!) 159.9 kg (!) 155.8 kg   General: No acute distress, currently on room air.  Looks chronically ill and deconditioned. respiratory: Bilateral decreased breath sounds at bases with some scattered crackles CVS: S1-S2 heard, rate controlled Abdominal: Soft, morbidly obese, nontender, nondistended, no organomegaly, bowel sounds heard  Extremities: No cyanosis, clubbing; trace lower extremity edema present   Condition at discharge: fair  The results of significant diagnostics from this hospitalization (including imaging, microbiology, ancillary and laboratory) are listed below for reference.   Imaging Studies: CT Renal Stone Study  Result Date: 05/14/2021 CLINICAL DATA:  Bilateral flank pain EXAM: CT ABDOMEN AND PELVIS WITHOUT CONTRAST TECHNIQUE: Multidetector CT imaging of the abdomen and pelvis was performed following the standard protocol without IV contrast. RADIATION DOSE REDUCTION: This exam was performed according to the departmental dose-optimization program which includes automated exposure control, adjustment of the mA and/or kV according to patient size and/or use of iterative reconstruction technique. COMPARISON:  CT 12/26/2020 FINDINGS: Lower chest: Mild bibasilar atelectasis.  Heart size is normal. Hepatobiliary: No focal liver abnormality is seen. Status post cholecystectomy. No biliary dilatation. Pancreas: Unremarkable. No pancreatic ductal dilatation or surrounding inflammatory changes. Spleen: Normal in size without focal abnormality. Adrenals/Urinary Tract: Adrenal glands are unremarkable.  Kidneys are normal, without renal calculi, focal lesion, or hydronephrosis. Bladder is unremarkable for the degree of distension. Stomach/Bowel: Stomach is within normal limits. No evidence of bowel wall thickening, distention, or inflammatory changes. Vascular/Lymphatic: No significant vascular findings are present. Multiple mildly prominent upper abdominal lymph nodes, stable from prior. No enlarged pelvic lymph nodes. Reproductive: IUD within the uterus. Uterus and adnexal regions are otherwise unremarkable. Other: No free fluid. No abdominopelvic fluid collection. No pneumoperitoneum. No abdominal wall hernia. Musculoskeletal: Redemonstration of mixed attenuation partially calcified mass within the subcutaneous soft tissues at the medial aspect of the left gluteal region. This is stable in size and appearance compared to multiple previous studies and is most compatible with a benign entity such as fat necrosis. No acute bony abnormality. Postsurgical changes to the lower lumbar spine. IMPRESSION: No acute abdominopelvic findings. Specifically, no evidence of obstructive uropathy. Electronically Signed   By: Davina Poke D.O.   On: 05/14/2021 11:09    Microbiology: Results for orders placed or performed during the hospital encounter of 05/14/21  Resp Panel by RT-PCR (Flu A&B, Covid) Nasopharyngeal Swab     Status: None   Collection Time: 05/14/21 12:18 PM   Specimen: Nasopharyngeal Swab; Nasopharyngeal(NP) swabs in vial transport medium  Result Value Ref Range Status   SARS Coronavirus 2 by RT PCR NEGATIVE NEGATIVE Final    Comment: (NOTE) SARS-CoV-2 target nucleic acids are NOT DETECTED.  The SARS-CoV-2 RNA is generally detectable in upper respiratory specimens during the acute phase of infection. The lowest concentration of SARS-CoV-2 viral copies this assay can detect is 138 copies/mL. A negative result does not preclude SARS-Cov-2 infection and should not be used as the sole basis for  treatment or other patient management decisions. A negative result may occur with  improper specimen collection/handling, submission of specimen other than nasopharyngeal swab, presence of viral mutation(s) within the areas targeted by this assay, and inadequate number of viral copies(<138 copies/mL). A negative result must be combined with clinical observations, patient history, and epidemiological information. The expected result is Negative.  Fact Sheet for Patients:  EntrepreneurPulse.com.au  Fact Sheet for Healthcare Providers:  IncredibleEmployment.be  This test is no t yet approved or cleared by the [place] FDA and  has been authorized for detection and/or diagnosis of SARS-CoV-2 by FDA under an Emergency Use Authorization (EUA). This EUA will remain  in effect (meaning this test can be used) for the duration of the COVID-19 declaration under Section 564(b)(1) of the Act, 21 U.S.C.section 360bbb-3(b)(1), unless the authorization is terminated  or revoked sooner.       Influenza A by PCR NEGATIVE NEGATIVE Final   Influenza B by PCR NEGATIVE NEGATIVE Final    Comment: (NOTE) The Xpert Xpress SARS-CoV-2/FLU/RSV plus assay is intended as an aid in the diagnosis of influenza from Nasopharyngeal swab specimens and should not be used as a sole basis for treatment. Nasal washings and aspirates are unacceptable for Xpert Xpress SARS-CoV-2/FLU/RSV testing.  Fact Sheet for Patients: EntrepreneurPulse.com.au  Fact Sheet for Healthcare Providers: IncredibleEmployment.be  This test is not yet approved or cleared by the [place] FDA and has been authorized for detection and/or diagnosis of SARS-CoV-2 by FDA under an Emergency Use Authorization (EUA). This EUA will remain in effect (meaning this test can be used) for the duration of the COVID-19 declaration under Section 564(b)(1) of the Act, 21  U.S.C. section 360bbb-3(b)(1), unless the authorization is terminated or revoked.  Performed at [facility], [address] [place], [place] [address]     Labs: CBC: Recent Labs  Lab 05/14/21 1003 05/15/21 0407 05/16/21 0418  WBC 10.5 8.0 4.8  NEUTROABS 4.9 3.8 2.2  HGB 14.6 12.6 12.4  HCT 44.3 38.6 39.9  MCV 93.3 94.4 97.1  PLT 216 166 712*   Basic Metabolic Panel: Recent Labs  Lab 05/14/21 1003 05/15/21 0407 05/16/21 0418 05/16/21 1209 05/17/21 0418  NA 131* 132* 133*  --  136  K 4.4 3.9 5.2*  --  4.3  CL 100 103 106  --  107  CO2 20* 22 23  --  26  GLUCOSE 376* 265* 393* 413* 214*  BUN 43* 47* 37*  --  22*  CREATININE 3.99* 2.69* 1.43*  --  0.95  CALCIUM 9.0 8.2* 8.3*  --  8.9  MG  --  1.6* 2.1  --  1.8   Liver Function Tests: Recent Labs  Lab 05/14/21 1003 05/15/21 0407  AST 49* 35  ALT 36 33  ALKPHOS 129* 93  BILITOT 0.5 0.3  PROT 7.4 6.1*  ALBUMIN 3.8 3.5   CBG: Recent Labs  Lab 05/16/21 0816 05/16/21 1116 05/16/21 1643 05/16/21 2141 05/17/21 0735  GLUCAP 361* 401* 304* 206* 239*    Discharge time spent: greater than 30 minutes.  Signed: Aline August, MD [facility] 05/17/2021

## 2021-05-17 NOTE — Plan of Care (Signed)
°  Problem: Clinical Measurements: °Goal: Respiratory complications will improve °Outcome: Progressing °Goal: Cardiovascular complication will be avoided °Outcome: Progressing °  °Problem: Activity: °Goal: Risk for activity intolerance will decrease °Outcome: Progressing °  °

## 2021-05-17 NOTE — Progress Notes (Signed)
Provided and discussed discharge instructions. Addressed all questions and concerns. IV removed intact.  ?Abdurahman Rugg N Jezebel Pollet ? ?

## 2021-05-17 NOTE — Plan of Care (Signed)
  Problem: Clinical Measurements: Goal: Diagnostic test results will improve Outcome: Progressing   

## 2021-05-23 ENCOUNTER — Ambulatory Visit: Payer: Medicare HMO | Admitting: Orthopaedic Surgery

## 2021-05-23 ENCOUNTER — Telehealth: Payer: Self-pay | Admitting: Orthopaedic Surgery

## 2021-05-23 NOTE — Telephone Encounter (Signed)
Pt called. She is unable to make her appt today because has a flat tire. She said she has a black spot on her foot that needs to be looked at. She thinks if she waits until 03/22 it will get infected. Can we work her in?  ? ?CB [phone] ?

## 2021-05-28 ENCOUNTER — Encounter: Payer: Self-pay | Admitting: Orthopaedic Surgery

## 2021-05-28 ENCOUNTER — Ambulatory Visit (INDEPENDENT_AMBULATORY_CARE_PROVIDER_SITE_OTHER): Payer: Medicare HMO | Admitting: Orthopaedic Surgery

## 2021-05-28 DIAGNOSIS — L97511 Non-pressure chronic ulcer of other part of right foot limited to breakdown of skin: Secondary | ICD-10-CM | POA: Diagnosis not present

## 2021-05-28 MED ORDER — DOXYCYCLINE HYCLATE 100 MG PO TABS: 100.0000 mg | ORAL_TABLET | Freq: Two times a day (BID) | ORAL | 1 refills | Status: DC

## 2021-05-28 NOTE — Progress Notes (Signed)
The patient comes in today for evaluation treatment of the right foot wound.  She was recently hospitalized and her hemoglobin A1c was 12.4.  She said the small area was a black spot that is getting bigger.  She was in the hospital due to acute on chronic kidney issues. ? ?Examination her right foot shows a small wound near the metatarsal on the fifth ray area.  I used a #10 blade and unroofed this area.  There was a small area of purulence.  It does track slightly deep.  There is no redness.  I was able to clean this area thoroughly. ? ?I am going to have her soak her foot daily and antibacterial soapy water for 15 to 20 minutes.  She will then place Bactroban ointment and protect the wound daily.  We will start her on doxycycline as an anti-inflammatory.  We will see her back in a month to make sure she is doing well.  If this is not getting better prior to then she will let us know.  At her next visit I would like 3 views of her right foot. ?

## 2021-05-29 DIAGNOSIS — Z975 Presence of (intrauterine) contraceptive device: Secondary | ICD-10-CM | POA: Insufficient documentation

## 2021-05-29 DIAGNOSIS — G43009 Migraine without aura, not intractable, without status migrainosus: Secondary | ICD-10-CM | POA: Insufficient documentation

## 2021-05-31 ENCOUNTER — Ambulatory Visit: Payer: Medicare HMO | Admitting: Orthopaedic Surgery

## 2021-06-27 ENCOUNTER — Ambulatory Visit: Payer: Medicare HMO | Admitting: Orthopaedic Surgery

## 2021-06-28 ENCOUNTER — Encounter: Payer: Self-pay | Admitting: Orthopaedic Surgery

## 2021-06-28 ENCOUNTER — Ambulatory Visit (INDEPENDENT_AMBULATORY_CARE_PROVIDER_SITE_OTHER): Payer: Medicare HMO

## 2021-06-28 ENCOUNTER — Ambulatory Visit (INDEPENDENT_AMBULATORY_CARE_PROVIDER_SITE_OTHER): Payer: Medicare HMO | Admitting: Orthopaedic Surgery

## 2021-06-28 DIAGNOSIS — M25561 Pain in right knee: Secondary | ICD-10-CM

## 2021-06-28 DIAGNOSIS — L97511 Non-pressure chronic ulcer of other part of right foot limited to breakdown of skin: Secondary | ICD-10-CM | POA: Diagnosis not present

## 2021-06-28 DIAGNOSIS — G8929 Other chronic pain: Secondary | ICD-10-CM | POA: Diagnosis not present

## 2021-06-28 DIAGNOSIS — M25562 Pain in left knee: Secondary | ICD-10-CM | POA: Diagnosis not present

## 2021-06-28 MED ORDER — METHYLPREDNISOLONE ACETATE 40 MG/ML IJ SUSP
40.0000 mg | INTRAMUSCULAR | Status: AC | PRN
  Administered 2021-06-28: 40 mg via INTRA_ARTICULAR

## 2021-06-28 MED ORDER — LIDOCAINE HCL 1 % IJ SOLN
3.0000 mL | INTRAMUSCULAR | Status: AC | PRN
  Administered 2021-06-28: 3 mL

## 2021-06-28 NOTE — Progress Notes (Signed)
? ?Office Visit Note ?  ?Patient: [name]           ?Date of Birth: 12-18-1970           ?MRN: [id number] ?Visit Date: 06/28/2021 ?             ?Requested by: Pecolia Ades, NP ?No address on file ?PCP: Pecolia Ades, NP ? ? ?Assessment & Plan: ?Visit Diagnoses:  ?1. Non-pressure chronic ulcer of other part of right foot limited to breakdown of skin ([place])   ?2. Chronic pain of left knee   ?3. Chronic pain of right knee   ? ? ?Plan: I did place a steroid injection in both knees today.  We can repeat this in 3 months as long as she is still under better blood glucose control.  If there is any issues with her right foot wound she will let me know. ? ?Follow-Up Instructions: Return in about 3 months (around 09/27/2021).  ? ?Orders:  ?Orders Placed This Encounter  ?Procedures  ? Large Joint Inj: R knee  ? Large Joint Inj: L knee  ? XR Foot Complete Right  ? ?No orders of the defined types were placed in this encounter. ? ? ? ? Procedures: ?Large Joint Inj: R knee on 06/28/2021 53:57 PM ?Indications: diagnostic evaluation and pain ?Details: 22 G 1.5 in needle, superolateral approach ? ?Arthrogram: No ? ?Medications: 3 mL lidocaine 1 %; 40 mg methylPREDNISolone acetate 40 MG/ML ?Outcome: tolerated well, no immediate complications ?Procedure, treatment alternatives, risks and benefits explained, specific risks discussed. Consent was given by the patient. Immediately prior to procedure a time out was called to verify the correct patient, procedure, equipment, support staff and site/side marked as required. Patient was prepped and draped in the usual sterile fashion.  ? ? ?Large Joint Inj: L knee on 06/28/2021 53:57 PM ?Indications: diagnostic evaluation and pain ?Details: 22 G 1.5 in needle, superolateral approach ? ?Arthrogram: No ? ?Medications: 3 mL lidocaine 1 %; 40 mg methylPREDNISolone acetate 40 MG/ML ?Outcome: tolerated well, no immediate complications ?Procedure, treatment alternatives, risks and  benefits explained, specific risks discussed. Consent was given by the patient. Immediately prior to procedure a time out was called to verify the correct patient, procedure, equipment, support staff and site/side marked as required. Patient was prepped and draped in the usual sterile fashion.  ? ? ? ? ?Clinical Data: ?No additional findings. ? ? ?Subjective: ?Chief Complaint  ?Patient presents with  ? Right Foot - Follow-up, Wound Check  ?The patient comes in today for continued follow-up for a wound that was underneath the fifth metatarsal area of her right foot.  She is also a poorly controlled diabetic.  At her last visit her hemoglobin A1c was over 12 and had declined to place injections in her knees with steroids like she has had in the past due to poorly controlled diabetes.  She says today that she really needs those and steroid injections in her knees and that her hemoglobin A1c has come down 3 points.  I do not have a record of that in epic.  She says her foot wound is doing well.  She says that her hemoglobin A1c is 8.1.  She also reports losing about 40 pounds. ? ?Wound Check ? ? ?Review of Systems ?She currently denies any headache, chest pain, shortness of breath, fever, chills, nausea, vomiting ? ?Objective: ?Vital Signs: There were no vitals taken for this visit. ? ?Physical Exam ?She is  alert and orient x3 and in no acute distress ?Ortho Exam ?Both knees have severe pain globally and patellofemoral crepitation with varus malalignment and significant bone-on-bone wear with pain medially on both knees and laterally.  Her left foot has a healing wound over the fifth metatarsal area that shows no evidence of infection today.  It is minimal thickness. ?Specialty Comments:  ?No specialty comments available. ? ?Imaging: ?No results found. ? ? ?PMFS History: ?Patient Active Problem List  ? Diagnosis Date Noted  ? Acute urinary retention 05/17/2021  ? Physical deconditioning 05/16/2021  ? Hyponatremia  05/16/2021  ? Hyperkalemia 05/16/2021  ? Chronically on opiate therapy 05/14/2021  ? AKI (acute kidney injury) ([place]) 03/26/2020  ? Seizure disorder ([place]) 03/07/2020  ? Depression with anxiety 03/25/2019  ? Umbilical hernia, incarcerated, s/p repair 10/16/2017 10/16/2017  ? Chronic pain of both knees 04/23/2017  ? Unilateral primary osteoarthritis, left knee 10/24/2016  ? Unilateral primary osteoarthritis, right knee 10/24/2016  ? Abnormal mammogram with microcalcification-Right inner lower quadrant 07/21/2013  ? Constipation 03/02/2011  ? Heel ulcer due to DM ([place]) 02/19/2011  ? Wound, open, hip or thigh 02/19/2011  ? Healing pressure ulcer stage III ([place]) 02/18/2011  ? Morbidly obese ([place]) 02/18/2011  ? Controlled type 2 diabetes mellitus without complication, without long-term current use of insulin ([place]) 02/18/2011  ? Sleep apnea 02/18/2011  ? Hypothyroidism 02/18/2011  ? Chronic pain 02/18/2011  ? ?Past Medical History:  ?Diagnosis Date  ? Acute renal failure (ARF) ([place]) 09/02/2012  ? Anemia   ? Anxiety   ? panic attacks  ? Back pain   ? Blood transfusion   ? Chronic heel ulcer ([place])   ? Chronic kidney disease   ? Chronic pain syndrome   ? Depression   ? Diabetes mellitus   ? type II   ? DJD (degenerative joint disease)   ? Dysrhythmia   ? pt unsure what this was  ? Fibromyalgia   ? GERD (gastroesophageal reflux disease)   ? H/O: Bell's palsy 2011  ? Heart murmur   ? "slight one"  ? History of 2019 novel coronavirus disease (COVID-19) 03/25/2019  ? History of kidney stones   ? History of MRSA infection OF ULCER  ? Hypertension   ? no longer taken since 10-11 months per patient at preop phone call of 10/13/2017   ? Hypothyroidism   ? Hypoventilation associated with obesity syndrome ([place])   ? Migraine   ? Non-healing non-surgical wound   ? Right hip, has Wound vac to hip.  Started as a skin tear.  ? Peripheral vascular disease ([place])   ? PONV (postoperative nausea and vomiting)   ? can be slow to wake up after surgery   ? Postoperative abscess 10/27/2017  ? Right foot drop   ? Shortness of breath   ? with Activity  ? Sleep apnea   ? "study shows not bad enough for CPAP.", pt denies  ? Umbilical hernia   ?  ?Family History  ?Problem Relation Age of Onset  ? Diabetes type II Father   ? Heart attack Father   ? Peripheral vascular disease Father   ? Diabetes type II Mother   ? Anesthesia problems Mother   ? Heart attack Mother   ? Peripheral vascular disease Mother   ? Hypertension Mother   ?  ?Past Surgical History:  ?Procedure Laterality Date  ? BACK SURGERY    ? for lumbar disc disease X2  ? BRAIN SURGERY  1970  ?  Tumor removed- has steel plate  ? BRAIN SURGERY    ?  Plating due to soft spot closing too early- age 44  ? BREAST LUMPECTOMY WITH NEEDLE LOCALIZATION Right 08/23/2013  ? Procedure: RIGHT BREAST LUMPECTOMY WITH NEEDLE LOCALIZATION;  Surgeon: Odis Hollingshead, MD;  Location: [facility];  Service: General;  Laterality: Right;  ? CHOLECYSTECTOMY  1984  ? EXCISION UMBILICAL NODULE N/A 12/08/9448  ? Procedure: EXCISION OF UMBILICUS;  Surgeon: Coralie Keens, MD;  Location: [facility];  Service: General;  Laterality: N/A;  ? EYE SURGERY    ? eye lift  ? I & D EXTREMITY  04/02/2011  ? Procedure: IRRIGATION AND DEBRIDEMENT EXTREMITY;  Surgeon: Mcarthur Rossetti;  Location: [facility];  Service: Orthopedics;  Laterality: Right;  I&D right heel ulcer, placement of A-cell graft  ? INCISION AND DRAINAGE ABSCESS N/A 10/28/2017  ? Procedure: INCISION AND DRAINAGE UMBILICAL HERNIA ABSCESS;  Surgeon: Ralene Ok, MD;  Location: [facility];  Service: General;  Laterality: N/A;  ? INCISION AND DRAINAGE OF WOUND  08/29/2011  ? Procedure: IRRIGATION AND DEBRIDEMENT WOUND;  Surgeon: Theodoro Kos, DO;  Location: [facility];  Service: Plastics;  Laterality: Right;  ? INSERTION OF MESH N/A 10/16/2017  ? Procedure: INSERTION OF MESH;  Surgeon: Coralie Keens, MD;  Location: [facility];  Service: General;  Laterality: N/A;  ? LITHOTRIPSY    ? 2007ish  ? right elbow    ?  UMBILICAL HERNIA REPAIR N/A 10/16/2017  ? Procedure: UMBILICAL HERNIA REPAIR WITH MESH;  Surgeon: Coralie Keens, MD;  Location: [facility];  Service: General;  Laterality: N/A;  ? ?Social History  ? ?Occupationa

## 2021-09-18 ENCOUNTER — Emergency Department (HOSPITAL_COMMUNITY): Payer: Medicare HMO

## 2021-09-18 ENCOUNTER — Other Ambulatory Visit: Payer: Self-pay

## 2021-09-18 ENCOUNTER — Inpatient Hospital Stay (HOSPITAL_COMMUNITY)
Admission: EM | Admit: 2021-09-18 | Discharge: 2021-09-28 | DRG: 683 | Disposition: A | Discharge status: Home Health | Payer: Medicare HMO | Attending: Family Medicine | Admitting: Family Medicine

## 2021-09-18 ENCOUNTER — Encounter (HOSPITAL_COMMUNITY): Payer: Self-pay

## 2021-09-18 DIAGNOSIS — E039 Hypothyroidism, unspecified: Secondary | ICD-10-CM | POA: Diagnosis present

## 2021-09-18 DIAGNOSIS — E876 Hypokalemia: Secondary | ICD-10-CM | POA: Diagnosis not present

## 2021-09-18 DIAGNOSIS — R4020 Unspecified coma: Secondary | ICD-10-CM | POA: Diagnosis present

## 2021-09-18 DIAGNOSIS — R55 Syncope and collapse: Secondary | ICD-10-CM | POA: Diagnosis present

## 2021-09-18 DIAGNOSIS — R569 Unspecified convulsions: Secondary | ICD-10-CM | POA: Diagnosis not present

## 2021-09-18 DIAGNOSIS — F32A Depression, unspecified: Secondary | ICD-10-CM | POA: Diagnosis present

## 2021-09-18 DIAGNOSIS — M170 Bilateral primary osteoarthritis of knee: Secondary | ICD-10-CM | POA: Diagnosis present

## 2021-09-18 DIAGNOSIS — N390 Urinary tract infection, site not specified: Secondary | ICD-10-CM | POA: Diagnosis not present

## 2021-09-18 DIAGNOSIS — G510 Bell's palsy: Secondary | ICD-10-CM | POA: Diagnosis present

## 2021-09-18 DIAGNOSIS — E872 Acidosis, unspecified: Secondary | ICD-10-CM | POA: Diagnosis present

## 2021-09-18 DIAGNOSIS — I959 Hypotension, unspecified: Secondary | ICD-10-CM | POA: Diagnosis present

## 2021-09-18 DIAGNOSIS — Z79890 Hormone replacement therapy (postmenopausal): Secondary | ICD-10-CM

## 2021-09-18 DIAGNOSIS — M21371 Foot drop, right foot: Secondary | ICD-10-CM | POA: Diagnosis present

## 2021-09-18 DIAGNOSIS — M797 Fibromyalgia: Secondary | ICD-10-CM | POA: Diagnosis present

## 2021-09-18 DIAGNOSIS — N179 Acute kidney failure, unspecified: Principal | ICD-10-CM | POA: Diagnosis present

## 2021-09-18 DIAGNOSIS — Z6841 Body Mass Index (BMI) 40.0 and over, adult: Secondary | ICD-10-CM

## 2021-09-18 DIAGNOSIS — Z8614 Personal history of Methicillin resistant Staphylococcus aureus infection: Secondary | ICD-10-CM

## 2021-09-18 DIAGNOSIS — M47816 Spondylosis without myelopathy or radiculopathy, lumbar region: Secondary | ICD-10-CM | POA: Diagnosis present

## 2021-09-18 DIAGNOSIS — B9620 Unspecified Escherichia coli [E. coli] as the cause of diseases classified elsewhere: Secondary | ICD-10-CM | POA: Diagnosis not present

## 2021-09-18 DIAGNOSIS — E860 Dehydration: Secondary | ICD-10-CM | POA: Diagnosis present

## 2021-09-18 DIAGNOSIS — K5909 Other constipation: Secondary | ICD-10-CM | POA: Diagnosis present

## 2021-09-18 DIAGNOSIS — Z886 Allergy status to analgesic agent status: Secondary | ICD-10-CM

## 2021-09-18 DIAGNOSIS — T383X6A Underdosing of insulin and oral hypoglycemic [antidiabetic] drugs, initial encounter: Secondary | ICD-10-CM | POA: Diagnosis present

## 2021-09-18 DIAGNOSIS — G40909 Epilepsy, unspecified, not intractable, without status epilepticus: Secondary | ICD-10-CM | POA: Diagnosis not present

## 2021-09-18 DIAGNOSIS — Z7984 Long term (current) use of oral hypoglycemic drugs: Secondary | ICD-10-CM

## 2021-09-18 DIAGNOSIS — T796XXA Traumatic ischemia of muscle, initial encounter: Principal | ICD-10-CM

## 2021-09-18 DIAGNOSIS — E1151 Type 2 diabetes mellitus with diabetic peripheral angiopathy without gangrene: Secondary | ICD-10-CM | POA: Diagnosis present

## 2021-09-18 DIAGNOSIS — Z881 Allergy status to other antibiotic agents status: Secondary | ICD-10-CM

## 2021-09-18 DIAGNOSIS — G894 Chronic pain syndrome: Secondary | ICD-10-CM | POA: Diagnosis present

## 2021-09-18 DIAGNOSIS — Z602 Problems related to living alone: Secondary | ICD-10-CM | POA: Diagnosis present

## 2021-09-18 DIAGNOSIS — Z79899 Other long term (current) drug therapy: Secondary | ICD-10-CM

## 2021-09-18 DIAGNOSIS — Z880 Allergy status to penicillin: Secondary | ICD-10-CM

## 2021-09-18 DIAGNOSIS — Z888 Allergy status to other drugs, medicaments and biological substances status: Secondary | ICD-10-CM

## 2021-09-18 DIAGNOSIS — Z794 Long term (current) use of insulin: Secondary | ICD-10-CM

## 2021-09-18 DIAGNOSIS — G4700 Insomnia, unspecified: Secondary | ICD-10-CM | POA: Diagnosis present

## 2021-09-18 DIAGNOSIS — F410 Panic disorder [episodic paroxysmal anxiety] without agoraphobia: Secondary | ICD-10-CM | POA: Diagnosis present

## 2021-09-18 DIAGNOSIS — Z8616 Personal history of COVID-19: Secondary | ICD-10-CM | POA: Diagnosis not present

## 2021-09-18 DIAGNOSIS — F514 Sleep terrors [night terrors]: Secondary | ICD-10-CM | POA: Diagnosis present

## 2021-09-18 DIAGNOSIS — Y92002 Bathroom of unspecified non-institutional (private) residence single-family (private) house as the place of occurrence of the external cause: Secondary | ICD-10-CM | POA: Diagnosis not present

## 2021-09-18 DIAGNOSIS — E1165 Type 2 diabetes mellitus with hyperglycemia: Secondary | ICD-10-CM | POA: Insufficient documentation

## 2021-09-18 DIAGNOSIS — W19XXXA Unspecified fall, initial encounter: Secondary | ICD-10-CM | POA: Diagnosis present

## 2021-09-18 DIAGNOSIS — K219 Gastro-esophageal reflux disease without esophagitis: Secondary | ICD-10-CM | POA: Diagnosis present

## 2021-09-18 DIAGNOSIS — Z833 Family history of diabetes mellitus: Secondary | ICD-10-CM

## 2021-09-18 DIAGNOSIS — Z7982 Long term (current) use of aspirin: Secondary | ICD-10-CM

## 2021-09-18 DIAGNOSIS — E11649 Type 2 diabetes mellitus with hypoglycemia without coma: Secondary | ICD-10-CM | POA: Diagnosis not present

## 2021-09-18 DIAGNOSIS — G8929 Other chronic pain: Secondary | ICD-10-CM | POA: Diagnosis present

## 2021-09-18 DIAGNOSIS — F419 Anxiety disorder, unspecified: Secondary | ICD-10-CM

## 2021-09-18 DIAGNOSIS — Z8249 Family history of ischemic heart disease and other diseases of the circulatory system: Secondary | ICD-10-CM

## 2021-09-18 DIAGNOSIS — R300 Dysuria: Secondary | ICD-10-CM

## 2021-09-18 DIAGNOSIS — I10 Essential (primary) hypertension: Secondary | ICD-10-CM | POA: Diagnosis not present

## 2021-09-18 DIAGNOSIS — Z7985 Long-term (current) use of injectable non-insulin antidiabetic drugs: Secondary | ICD-10-CM

## 2021-09-18 DIAGNOSIS — M6282 Rhabdomyolysis: Secondary | ICD-10-CM | POA: Diagnosis present

## 2021-09-18 DIAGNOSIS — R339 Retention of urine, unspecified: Secondary | ICD-10-CM

## 2021-09-18 DIAGNOSIS — Z9049 Acquired absence of other specified parts of digestive tract: Secondary | ICD-10-CM

## 2021-09-18 DIAGNOSIS — Z882 Allergy status to sulfonamides status: Secondary | ICD-10-CM

## 2021-09-18 DIAGNOSIS — Z7951 Long term (current) use of inhaled steroids: Secondary | ICD-10-CM

## 2021-09-18 LAB — CK: Total CK: 2597 U/L — ABNORMAL HIGH (ref 38–234)

## 2021-09-18 LAB — COMPREHENSIVE METABOLIC PANEL
ALT: 26 U/L (ref 0–44)
AST: 59 U/L — ABNORMAL HIGH (ref 15–41)
Albumin: 4 g/dL (ref 3.5–5.0)
Alkaline Phosphatase: 127 U/L — ABNORMAL HIGH (ref 38–126)
Anion gap: 19 — ABNORMAL HIGH (ref 5–15)
BUN: 44 mg/dL — ABNORMAL HIGH (ref 6–20)
CO2: 14 mmol/L — ABNORMAL LOW (ref 22–32)
Calcium: 9 mg/dL (ref 8.9–10.3)
Chloride: 101 mmol/L (ref 98–111)
Creatinine, Ser: 6.47 mg/dL — ABNORMAL HIGH (ref 0.44–1.00)
GFR, Estimated: 7 mL/min — ABNORMAL LOW (ref >60)
Glucose, Bld: 358 mg/dL — ABNORMAL HIGH (ref 70–99)
Potassium: 3.1 mmol/L — ABNORMAL LOW (ref 3.5–5.1)
Sodium: 134 mmol/L — ABNORMAL LOW (ref 135–145)
Total Bilirubin: 1.2 mg/dL (ref 0.3–1.2)
Total Protein: 7.2 g/dL (ref 6.5–8.1)

## 2021-09-18 LAB — CBC WITH DIFFERENTIAL/PLATELET
Abs Immature Granulocytes: 0.1 10*3/uL — ABNORMAL HIGH (ref 0.00–0.07)
Basophils Absolute: 0 10*3/uL (ref 0.0–0.1)
Basophils Relative: 0 %
Eosinophils Absolute: 0.1 10*3/uL (ref 0.0–0.5)
Eosinophils Relative: 0 %
HCT: 44.1 % (ref 36.0–46.0)
Hemoglobin: 14.7 g/dL (ref 12.0–15.0)
Immature Granulocytes: 1 %
Lymphocytes Relative: 21 %
Lymphs Abs: 3.9 10*3/uL (ref 0.7–4.0)
MCH: 31.5 pg (ref 26.0–34.0)
MCHC: 33.3 g/dL (ref 30.0–36.0)
MCV: 94.4 fL (ref 80.0–100.0)
Monocytes Absolute: 1.7 10*3/uL — ABNORMAL HIGH (ref 0.1–1.0)
Monocytes Relative: 9 %
Neutro Abs: 12.6 10*3/uL — ABNORMAL HIGH (ref 1.7–7.7)
Neutrophils Relative %: 69 %
Platelets: 274 10*3/uL (ref 150–400)
RBC: 4.67 MIL/uL (ref 3.87–5.11)
RDW: 14 % (ref 11.5–15.5)
WBC: 18.4 10*3/uL — ABNORMAL HIGH (ref 4.0–10.5)
nRBC: 0 % (ref 0.0–0.2)

## 2021-09-18 MED ORDER — POLYETHYLENE GLYCOL 3350 17 G PO PACK
17.0000 g | PACK | Freq: Every day | ORAL | Status: DC | PRN
Start: 2021-09-18 — End: 2021-09-18

## 2021-09-18 MED ORDER — MORPHINE SULFATE (PF) 4 MG/ML IV SOLN
4.0000 mg | Freq: Once | INTRAVENOUS | Status: AC
  Administered 2021-09-18: 4 mg via INTRAVENOUS
  Filled 2021-09-18: qty 1

## 2021-09-18 MED ORDER — ACETAMINOPHEN 325 MG PO TABS
650.0000 mg | ORAL_TABLET | Freq: Four times a day (QID) | ORAL | Status: AC
  Administered 2021-09-18 – 2021-09-19 (×4): 650 mg via ORAL
  Filled 2021-09-18 (×4): qty 2

## 2021-09-18 MED ORDER — LACTATED RINGERS IV SOLN: INTRAVENOUS | Status: DC

## 2021-09-18 MED ORDER — POLYETHYLENE GLYCOL 3350 17 G PO PACK
17.0000 g | PACK | Freq: Every day | ORAL | Status: DC
  Administered 2021-09-18: 17 g via ORAL
  Filled 2021-09-18 (×3): qty 1

## 2021-09-18 MED ORDER — OXYCODONE-ACETAMINOPHEN 5-325 MG PO TABS
1.0000 | ORAL_TABLET | Freq: Once | ORAL | Status: AC
  Administered 2021-09-18: 1 via ORAL
  Filled 2021-09-18: qty 1

## 2021-09-18 MED ORDER — HYDROMORPHONE HCL 1 MG/ML IJ SOLN
1.0000 mg | Freq: Once | INTRAMUSCULAR | Status: AC
  Administered 2021-09-18: 1 mg via INTRAVENOUS
  Filled 2021-09-18: qty 1

## 2021-09-18 MED ORDER — HEPARIN SODIUM (PORCINE) 5000 UNIT/ML IJ SOLN
5000.0000 [IU] | Freq: Three times a day (TID) | INTRAMUSCULAR | Status: DC
  Administered 2021-09-18 – 2021-09-21 (×10): 5000 [IU] via SUBCUTANEOUS
  Filled 2021-09-18 (×11): qty 1

## 2021-09-18 MED ORDER — ONDANSETRON HCL 4 MG/2ML IJ SOLN
4.0000 mg | Freq: Once | INTRAMUSCULAR | Status: AC
  Administered 2021-09-18: 4 mg via INTRAVENOUS
  Filled 2021-09-18: qty 2

## 2021-09-18 MED ORDER — POTASSIUM CHLORIDE CRYS ER 20 MEQ PO TBCR
20.0000 meq | EXTENDED_RELEASE_TABLET | Freq: Once | ORAL | Status: AC
  Administered 2021-09-18: 20 meq via ORAL
  Filled 2021-09-18: qty 1

## 2021-09-18 MED ORDER — HYDROMORPHONE HCL 2 MG PO TABS
1.0000 mg | ORAL_TABLET | Freq: Three times a day (TID) | ORAL | Status: DC | PRN
  Administered 2021-09-18: 1 mg via ORAL
  Filled 2021-09-18: qty 1

## 2021-09-18 MED ORDER — LACTATED RINGERS IV BOLUS
1000.0000 mL | Freq: Once | INTRAVENOUS | Status: AC
  Administered 2021-09-18: 1000 mL via INTRAVENOUS

## 2021-09-18 MED ORDER — INSULIN ASPART 100 UNIT/ML IJ SOLN
0.0000 [IU] | Freq: Three times a day (TID) | INTRAMUSCULAR | Status: DC
  Administered 2021-09-19: 15 [IU] via SUBCUTANEOUS
  Administered 2021-09-19: 11 [IU] via SUBCUTANEOUS

## 2021-09-18 MED ORDER — GABAPENTIN 300 MG PO CAPS
300.0000 mg | ORAL_CAPSULE | Freq: Every day | ORAL | Status: DC
  Administered 2021-09-18 – 2021-09-28 (×11): 300 mg via ORAL
  Filled 2021-09-18 (×12): qty 1

## 2021-09-18 NOTE — Assessment & Plan Note (Addendum)
Pain from severe OA stable on med regimen. She was supposed to see Dr. Ninfa Linden for knee injections today. -Continue Lidocaine patch -Gabapentin '300mg'$  QHS -PT/OT following, attempt to walk and mobilize -Tylenol 650 mg every 6 hours -Decrease home oxy to 5 mg -Fentanyl patch 50 mcg every 72 hours -Bowel regimen: continue senna BID and add on miralax BID

## 2021-09-18 NOTE — Progress Notes (Signed)
I have seen and evaluated this patient. Management plan would be discussed with the resident and full H&P to follow.

## 2021-09-18 NOTE — Assessment & Plan Note (Addendum)
Reported 4 episodes of loss of consciousness in the 2 weeks prior to admission. Workup with orthostatics, Echo and EEG unremarkable. No further episodes since hospitalization. Consider hypoglycemia and hypotension as cause of loss of consciousness.

## 2021-09-18 NOTE — Assessment & Plan Note (Addendum)
Potassium stable at 3.7 this morning. -am BMP

## 2021-09-18 NOTE — Consult Note (Addendum)
Nephrology Consult     Assessment/Recommendations:   AKI -suspecting this is prerenal in origin especially in the context of severe hyperglycemia.  Likely has some mild rhabdo, however CK mildly elevated -agree with isotonic fluids/LR -UA pending -check renal ultrasound -Continue to monitor daily Cr, Dose meds for GFR<15 -Avoid nephrotoxic medications including NSAIDs and iodinated intravenous contrast exposure unless the latter is absolutely indicated.  Preferred narcotic agents for pain control are hydromorphone, fentanyl, and methadone. Morphine should not be used. Avoid Baclofen and avoid oral sodium phosphate and magnesium citrate based laxatives / bowel preps. Continue strict Input and Output monitoring. Will monitor the patient closely with you and intervene or adjust therapy as indicated by changes in clinical status/labs   Hypertension: -I do not see any anti-HTN on her home med list. Can start CCB if needed  Hypokalemia -Ordered KCl x 1 dose, caution w/ repletion  Leukocytosis -w/u per primary service  Metabolic acidosis, anion gap -mediated by AKI and possibly severe hyperglycemia -LR for now, if no improvement then can switch to bicarb based fluids  Uncontrolled Diabetes Mellitus Type 2 with Hyperglycemia -Management per primary service  LOC, fall -w/u per primary   Anthony Sar [facility] 09/18/2021 6:12 PM   _____________________________________________________________________________________   History of Present Illness: [name] is a/an 51 y.o. female with a past medical history of DM2, hypertension, obesity, chronic pain syndrome, seizure disorder, history of AKI's requiring brief dialysis/CRRT who presents to [facility] with dizziness.  Because of this, she reports that she lost consciousness and had a fall.  Patient reports that she was down for about 2 days and unable to pick herself up.  In the ER, found to have a creatinine of  6.47, BUN 44, bicarb 14 with anion gap of 19 and glucose 358, WBC 18.4, CK 2597. She does have a history of AKI's.  She had episode of this in January 2022 requiring brief dialysis/CRRT which is possibly due to polypharmacy.  She had another episode in February with decreased urine output and a creatinine of 3.9 on presentation at that time, thought to be secondary to polypharmacy again.  Kidney function improved with IV hydration at that time. Last known creatinine that I can see was 0.95 on 05/17/2021 (previous admission). Patient denies any excessive use of her pre-existing medications.  She is concerned as to why she is having some any episodes of dizziness.  She does report that her appetite has been low for quite some time and has been experiencing diarrhea as well.  She otherwise denies any fevers, chills, chest pain, shortness of breath.  Medications:  Current Facility-Administered Medications  Medication Dose Route Frequency Provider Last Rate Last Admin   HYDROmorphone (DILAUDID) injection 1 mg  1 mg Intravenous Once Kommor, Madison, MD       lactated ringers infusion   Intravenous Continuous Kommor, Madison, MD       Current Outpatient Medications  Medication Sig Dispense Refill   acetaminophen (TYLENOL) 325 MG tablet Take 2 tablets (650 mg total) by mouth every 6 (six) hours as needed for mild pain (or Fever >/= 101).     amitriptyline (ELAVIL) 25 MG tablet Take 1 tablet (25 mg total) by mouth at bedtime.     ASPIRIN LOW DOSE 81 MG EC tablet Take 243 mg by mouth daily.     atorvastatin (LIPITOR) 20 MG tablet Take 20 mg by mouth daily.     Biotin 44010 MCG TABS Take 10,000 mcg by  mouth daily.     carisoprodol (SOMA) 350 MG tablet Take 350 mg by mouth 4 (four) times daily as needed.     cetirizine (ZYRTEC) 10 MG tablet Take 10 mg by mouth daily as needed for allergies.     Cholecalciferol (VITAMIN D3) 50 MCG (2000 UT) TABS Take 1 tablet by mouth daily.     Cyanocobalamin (VITAMIN  DEFICIENCY SYSTEM-B12) 1000 MCG/ML KIT Inject 1,000 mcg as directed every 30 (thirty) days.     Diclofenac Sodium 2 % SOLN Place 2-4 g onto the skin 4 (four) times daily as needed. (Patient taking differently: Place 4 g onto the skin 4 (four) times daily as needed (for pain).) 200 g 3   doxycycline (VIBRA-TABS) 100 MG tablet Take 1 tablet (100 mg total) by mouth 2 (two) times daily. 30 tablet 1   DULoxetine (CYMBALTA) 60 MG capsule Take 1 capsule (60 mg total) by mouth 2 (two) times daily. 30 capsule 3   fentaNYL (DURAGESIC) 50 MCG/HR Place 1 patch onto the skin every 3 (three) days. 5 patch 0   gabapentin (NEURONTIN) 600 MG tablet Take 0.5 tablets (300 mg total) by mouth 3 (three) times daily.     insulin aspart protamine - aspart (NOVOLOG MIX 70/30 FLEXPEN) (70-30) 100 UNIT/ML FlexPen Inject 75 Units into the skin in the morning, at noon, and at bedtime.     levETIRAcetam (KEPPRA) 750 MG tablet Take 750 mg by mouth 2 (two) times daily. (Patient not taking: Reported on 05/14/2021)     levothyroxine (SYNTHROID) 150 MCG tablet Take 150 mcg by mouth daily before breakfast.     lidocaine (LIDODERM) 5 % Place 3 patches onto the skin See admin instructions. Place 3 patches daily to affected areas and replace every 24 hours  0   NARCAN 4 MG/0.1ML LIQD nasal spray kit Place 4 mg into the nose daily as needed (opioid overdose).  0   NEXIUM 40 MG capsule Take 40 mg by mouth daily.     nystatin (MYCOSTATIN/NYSTOP) powder Apply 1 application topically 3 (three) times daily.     omega-3 acid ethyl esters (LOVAZA) 1 g capsule Take 1 capsule by mouth 2 (two) times daily.     Oxycodone HCl 10 MG TABS Take 1 tablet (10 mg total) by mouth every 6 (six) hours as needed (for pain). 1 tablet 0   OZEMPIC, 0.25 OR 0.5 MG/DOSE, 2 MG/1.5ML SOPN Inject 0.5 mg into the skin every Thursday.     senna (SENOKOT) 8.6 MG TABS tablet Take 8.6 mg by mouth daily as needed for mild constipation.  (Patient not taking: Reported on  05/14/2021)     sitaGLIPtin (JANUVIA) 50 MG tablet Take 50 mg by mouth daily.     SYMBICORT 160-4.5 MCG/ACT inhaler Inhale 2 puffs into the lungs 2 (two) times daily as needed for shortness of breath or wheezing.     triamcinolone cream (KENALOG) 0.1 % Apply 1 application topically 2 (two) times daily as needed (skin rash).       ALLERGIES Penicillins, Clindamycin/lincomycin, Nsaids, Sulfa antibiotics, Dulaglutide, No healthtouch food allergies, and Versed [midazolam]  MEDICAL HISTORY Past Medical History:  Diagnosis Date   Acute renal failure (ARF) (HCC) 09/02/2012   Anemia    Anxiety    panic attacks   Back pain    Blood transfusion    Chronic heel ulcer (HCC)    Chronic kidney disease    Chronic pain syndrome    Depression    Diabetes mellitus  type II    DJD (degenerative joint disease)    Dysrhythmia    pt unsure what this was   Fibromyalgia    GERD (gastroesophageal reflux disease)    H/O: Bell's palsy 2011   Heart murmur    "slight one"   History of 2019 novel coronavirus disease (COVID-19) 03/25/2019   History of kidney stones    History of MRSA infection OF ULCER   Hypertension    no longer taken since 10-11 months per patient at preop phone call of 10/13/2017    Hypothyroidism    Hypoventilation associated with obesity syndrome (HCC)    Migraine    Non-healing non-surgical wound    Right hip, has Wound vac to hip.  Started as a skin tear.   Peripheral vascular disease (HCC)    PONV (postoperative nausea and vomiting)    can be slow to wake up after surgery   Postoperative abscess 10/27/2017   Right foot drop    Shortness of breath    with Activity   Sleep apnea    "study shows not bad enough for CPAP.", pt denies   Umbilical hernia      SOCIAL HISTORY Social History   Socioeconomic History   Marital status: Single    Spouse name: Not on file   Number of children: Not on file   Years of education: Not on file   Highest education level: Not on file   Occupational History   Not on file  Tobacco Use   Smoking status: Never   Smokeless tobacco: Never  Vaping Use   Vaping Use: Never used  Substance and Sexual Activity   Alcohol use: No   Drug use: No   Sexual activity: Never  Other Topics Concern   Not on file  Social History Narrative   Not on file   Social Determinants of Health   Financial Resource Strain: Not on file  Food Insecurity: Not on file  Transportation Needs: Not on file  Physical Activity: Not on file  Stress: Not on file  Social Connections: Not on file  Intimate Partner Violence: Not on file     FAMILY HISTORY Family History  Problem Relation Age of Onset   Diabetes type II Father    Heart attack Father    Peripheral vascular disease Father    Diabetes type II Mother    Anesthesia problems Mother    Heart attack Mother    Peripheral vascular disease Mother    Hypertension Mother      Review of Systems: 54 systems reviewed Otherwise as per HPI, all other systems reviewed and negative  Physical Exam: Vitals:   09/18/21 1700 09/18/21 1720  BP: 113/86 (!) 160/145  Pulse: 97 98  Resp: (!) 23 18  Temp:    SpO2: 95% 97%   No intake/output data recorded. No intake or output data in the 24 hours ending 09/18/21 1812 General: Anxious appearing CV: regular rate, normal rhythm, no murmurs, no gallops, no rubs Lungs: clear to auscultation bilaterally, normal work of breathing Abd: Obese, soft, non-tender, non-distended Skin: no visible lesions or rashes Musculoskeletal: No significant edema Neuro: normal speech, no gross focal deficits, no asterixis  Test Results Reviewed Lab Results  Component Value Date   NA 134 (L) 09/18/2021   K 3.1 (L) 09/18/2021   CL 101 09/18/2021   CO2 14 (L) 09/18/2021   BUN 44 (H) 09/18/2021   CREATININE 6.47 (H) 09/18/2021   CALCIUM 9.0 09/18/2021  ALBUMIN 4.0 09/18/2021   PHOS 2.6 12/30/2020     I have reviewed all relevant outside healthcare records  related to the patient's kidney injury.

## 2021-09-18 NOTE — Assessment & Plan Note (Addendum)
Glucose stable in 200s overall last couple days. - Continue 40u BID 70/30 insulin - Continue CBGs, rSSI, carb-modified diet while in hospital

## 2021-09-18 NOTE — H&P (Addendum)
Hospital Admission History and Physical Service Pager: [phone]  Patient name: [name] Medical record number: [id number] Date of Birth: 01-27-1971 Age: 51 y.o. Gender: female  Primary Care Provider: Pecolia Ades, NP Consultants: Nephrology Code Status: FULL CODE  Preferred Emergency Contact:  Contact Information     Name Relation Home Work [place] Brother [phone]  [phone]   Chief Complaint: AKI  Assessment and Plan: [name] is a 51 y.o. female presenting with AKI. Differential for this patient's AKI includes prerenal due to dehydration, ATN secondary to rhabdomyolysis and postobstructive AKI which is less likely given the absence of hydronephrosis on renal US.  * AKI (acute kidney injury) ([place]) Patient presented immobilized for 2 days and was without p.o. intake during this timeframe.  Initial labs showed worseing kidney function with creatinine of 6.47(Baseline ~1.7), BUN 44, GFR of 7. Patient reported last void was 3 days ago, denies any suprapubic discomfort. Renal ultrasound was negative for hydronephrosis, will obtain bladder scan and might eventually need foley placement. Although patient's AKI is likely prerenal in the setting of dehydration due to poor p.o. intake in 2 days, other contributing factors to AKI include rhabdomyolysis with CK of 2597 and possible hyperglycemic hyperosmolar state with elevated serum glucose of 358 on admission. -Admit to med telemetry with [place], attending Dr. Gwendlyn Deutscher -Nephrology following, appreciate recs -Follow-up UA with microscopy -Avoid nephrotoxic agents -Bladder scan Q8H -Heparin for VTE -Maintenance IV fluid of LR at 147m/hr -Carb Modified diet  Rhabdomyolysis Patient presented to the ED with immobilization for 2 days following a fall.  CK on admission was 2597 with ALP of 127, creatinine of 6.47, BUN 44, GFR of 7 and anion gap of 19.  No report of immobilization with elevated  creatinine and worsening kidney function my overall picture is concerning for rhabdomyolysis.  Will start fluid resuscitation, monitor renal function and trend CK closely. -Trend CK -Repeat BMP -Follow-up UA with microscopy -Continue maintenance IV fluid  Loss of consciousness ([place] Patient reports having 4 episodes of loss of consciousness in the last 2 weeks. Pr Unclear if these episodes were seizures but  Feels strongly her most recent event was most likely seizure.  She endorses compliance with her seizure medications and prior to 2 weeks ago her seizures have been well controlled on her current treatment regimen which include Keppra 750 mg twice daily.  Of note she reports decreased p.o. intake in the last weeks and also significant insomnia where she goes 5 days without sleep.  Endorses intermittent lightheadedness but unsure if they are correlated with her episodes.  She denies any chest pains, hypertension but has occasionally SOB and lightheadedness.  Unclear etiology for her loss of consciousness, differential diagnoses include cardiac causes, orthostatics due to poor po intake or seizures. -Follow-up orthostatic vitals -Follow up Keppra level -Seizure precautions -Neurochecks -Low threshold to consult neurology -Will consider obtaining echocardiogram.  Poorly controlled type 2 diabetes mellitus ([place] Patient with a long history of T2DM had blood glucose of 398 on admission.  Home medication include Norvolog mix 70/30 75unit TID and recently started weekly Ozempic. Patient report eating only a plate of salad daily in the last couple of weeks in an attempt to lose weight. Last reported use of her home insulin was 3 days ago.  -POCT CBG Q4H. -Follow up A1c -Start mSSI   Hypokalemia Patient's potassium on admission was 3.1 and baseline is around 4.5.  EKG showed sinus tachycardia with  no ST changes.  Given poor kidney function at this time we will replenish cautiously with 20 mEq of  potassium chloride. -Continue close monitoring of BMP -Replenish K when appropriate.  Chronic pain Patient with severe arthritis of the lower back and bilateral knee.  Home medication include chronic oxycodone 10 mg every 6 hours as needed, fentanyl patch every Q3D and lidocaine patch. She endorses having lowerback pain and bilateral knee pain which is more severe on the left knee. Imagining show significant osteoarthritis at the L3- L4 & L4-L5 of lumbar spine and bilateral knee. -Lidocain patch -Gabapentin '300mg'$  QHS -PT/OT eval and treat -Pain regime  Tylenol 650 mg every 6 hours  Dilaudid 1 mg every 8 hours as needed    FEN/GI: Carb modified diet VTE Prophylaxis: Heparin subcu  Disposition: Admit to MedSurg  History of Present Illness:  [name] is a 51 y.o. female presenting after a fall in her bathroom two days ago.   States that she is working on weight loss for knee replacement surgery. She is currently complaining of pain in both knees as she fell on her knees 2 days ago. She was on the ground for two days, was unable to get in touch with brother until today to allow paramedics to come in. States that she was unable to stand 2/2 knee pain.  She believes that she had a seizure which caused her to fall. She recalls having 3-4 seizures in the last 2 weeks. She reports recent history of rib fx from recent seizure.   Denies any recent medication changes. She reports good compliance with anti-epileptics. Endorses lightheadedness.   She does not eat much- just "one little salad". She also reports having a difficult time sleeping- can go 4-5 days without sleep. She was previously on a medication for sleep.   She reports history of kidney failure four times in the past. She reports history of elevated CK in the past due to dehydration.   Has not urinated in several days, reports last void was 3 days ago. Last BM 2.5 days ago. Denies abdominal pain. Has chronic constipation-  takes chronic oxycodone and has fentanyl patch.   No chest pain, SOB. Nausea last week that has resolved. No vision changes. No incontinence with seizure.   In the ED, presented hypertensive to 159/134. She received 1L LR bolus and initiated on LR 125 mL/hr. Has been in severe pain and received Percocet 5-'325mg'$ , followed by zofran '4mg'$  and morphine '4mg'$ . Additionally received Dilaudid '1mg'$  IV.   Previous admissions for AKI. [place] recently, b/l knee OA, been on the ground for 2 days. New AKI, rhabdo  Review Of Systems: Per HPI  Pertinent Past Medical History: T2DM, HTN, morbid obesity, chronic pain syndrome, seizure disorder, hypothyroidism. Remainder reviewed in history tab.   Pertinent Past Surgical History: Brain surgery (1970), back surgery for lumbar disc disease x2, breast lumpectomy, cholecystectomy  Remainder reviewed in history tab.   Pertinent Social History: Tobacco use: Yes/No/Former Alcohol use: Denies Other Substance use: Denies  Lives alone with dog  Pertinent Family History: Diabetes, PVD, and heart attacks in both parents. Remainder reviewed in history tab.   Important Outpatient Medications: Remainder reviewed in medication history.   Objective: BP 105/62   Pulse 91   Temp 98.2 F (36.8 C) (Oral)   Resp 16   Ht '5\' 6"'$  (1.676 m)   Wt (!) 155.6 kg   SpO2 97%   BMI 55.36 kg/m  Exam: General: Awake, alert and appropriately  responsive, NAD HEENT:  PERRL.  Bruise above left eyelid, ptosis of the left eye 2/2 Bell's palsy. Neck: Supple Lymph Nodes: No palpable lymphadenopathy Chest: CTAB, normal WOB. Good air movement bilaterally.   Heart: RRR, normal S1, S2. No murmur appreciated Abdomen: Soft, non-tender, non-distended. Normoactive bowel sounds. No HSM appreciated.  Extremities: 5/5 muscle strength in all extremities.  Restricted extension of bilateral knee, worse on the right knee. MSK: Normal bulk and tone Neuro: Appropriately responsive to stimuli. No  gross deficits appreciated.  Skin: No rashes or lesions appreciated.    Labs:  CBC BMET  Recent Labs  Lab 09/18/21 1356  WBC 18.4*  HGB 14.7  HCT 44.1  PLT 274   Recent Labs  Lab 09/18/21 1356  NA 134*  K 3.1*  CL 101  CO2 14*  BUN 44*  CREATININE 6.47*  GLUCOSE 358*  CALCIUM 9.0     CK: 2597  EKG: Normal sinus rhythm without ST changes (viewed by myself)   Imaging Studies Performed: Knee XR: severe tricompartment arthritis of knees bilaterally Lumbar XR: No acute osseous abnormality. Multilevel degenerative changes, worse at L3-L4 and L4-L5. 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  Alen Bleacher, MD 09/18/2021, 11:51 PM PGY-1, [place] Intern pager: [phone], text pages welcome Secure chat group [place]

## 2021-09-18 NOTE — Assessment & Plan Note (Deleted)
CK improved from 2597> 1660>621 this morning after receiving maintenance IV fluids since admission.  We will stop trending CK -Follow-up UA with microscopy -Continue maintenance IV fluid

## 2021-09-19 ENCOUNTER — Inpatient Hospital Stay (HOSPITAL_COMMUNITY): Payer: Medicare HMO

## 2021-09-19 DIAGNOSIS — N179 Acute kidney failure, unspecified: Secondary | ICD-10-CM | POA: Diagnosis not present

## 2021-09-19 DIAGNOSIS — G40909 Epilepsy, unspecified, not intractable, without status epilepticus: Secondary | ICD-10-CM | POA: Diagnosis not present

## 2021-09-19 DIAGNOSIS — R569 Unspecified convulsions: Secondary | ICD-10-CM

## 2021-09-19 DIAGNOSIS — R4020 Unspecified coma: Secondary | ICD-10-CM

## 2021-09-19 LAB — COMPREHENSIVE METABOLIC PANEL
ALT: 22 U/L (ref 0–44)
AST: 43 U/L — ABNORMAL HIGH (ref 15–41)
Albumin: 3.3 g/dL — ABNORMAL LOW (ref 3.5–5.0)
Alkaline Phosphatase: 111 U/L (ref 38–126)
Anion gap: 16 — ABNORMAL HIGH (ref 5–15)
BUN: 43 mg/dL — ABNORMAL HIGH (ref 6–20)
CO2: 18 mmol/L — ABNORMAL LOW (ref 22–32)
Calcium: 8.5 mg/dL — ABNORMAL LOW (ref 8.9–10.3)
Chloride: 100 mmol/L (ref 98–111)
Creatinine, Ser: 4.11 mg/dL — ABNORMAL HIGH (ref 0.44–1.00)
GFR, Estimated: 12 mL/min — ABNORMAL LOW (ref >60)
Glucose, Bld: 413 mg/dL — ABNORMAL HIGH (ref 70–99)
Potassium: 2.8 mmol/L — ABNORMAL LOW (ref 3.5–5.1)
Sodium: 134 mmol/L — ABNORMAL LOW (ref 135–145)
Total Bilirubin: 0.8 mg/dL (ref 0.3–1.2)
Total Protein: 6 g/dL — ABNORMAL LOW (ref 6.5–8.1)

## 2021-09-19 LAB — CBC
HCT: 37.9 % (ref 36.0–46.0)
Hemoglobin: 12.8 g/dL (ref 12.0–15.0)
MCH: 31.5 pg (ref 26.0–34.0)
MCHC: 33.8 g/dL (ref 30.0–36.0)
MCV: 93.3 fL (ref 80.0–100.0)
Platelets: 193 10*3/uL (ref 150–400)
RBC: 4.06 MIL/uL (ref 3.87–5.11)
RDW: 13.8 % (ref 11.5–15.5)
WBC: 11.8 10*3/uL — ABNORMAL HIGH (ref 4.0–10.5)
nRBC: 0 % (ref 0.0–0.2)

## 2021-09-19 LAB — BLOOD GAS, VENOUS
Acid-base deficit: 3.1 mmol/L — ABNORMAL HIGH (ref 0.0–2.0)
Bicarbonate: 21.2 mmol/L (ref 20.0–28.0)
Drawn by: 42628
O2 Saturation: 94.6 %
Patient temperature: 37
pCO2, Ven: 35 mmHg — ABNORMAL LOW (ref 44–60)
pH, Ven: 7.39 (ref 7.25–7.43)
pO2, Ven: 68 mmHg — ABNORMAL HIGH (ref 32–45)

## 2021-09-19 LAB — GLUCOSE, CAPILLARY
Glucose-Capillary: 215 mg/dL — ABNORMAL HIGH (ref 70–99)
Glucose-Capillary: 225 mg/dL — ABNORMAL HIGH (ref 70–99)
Glucose-Capillary: 311 mg/dL — ABNORMAL HIGH (ref 70–99)
Glucose-Capillary: 335 mg/dL — ABNORMAL HIGH (ref 70–99)
Glucose-Capillary: 395 mg/dL — ABNORMAL HIGH (ref 70–99)

## 2021-09-19 LAB — BASIC METABOLIC PANEL
Anion gap: 10 (ref 5–15)
BUN: 34 mg/dL — ABNORMAL HIGH (ref 6–20)
CO2: 21 mmol/L — ABNORMAL LOW (ref 22–32)
Calcium: 8.5 mg/dL — ABNORMAL LOW (ref 8.9–10.3)
Chloride: 107 mmol/L (ref 98–111)
Creatinine, Ser: 1.99 mg/dL — ABNORMAL HIGH (ref 0.44–1.00)
GFR, Estimated: 29 mL/min — ABNORMAL LOW (ref >60)
Glucose, Bld: 356 mg/dL — ABNORMAL HIGH (ref 70–99)
Potassium: 3.3 mmol/L — ABNORMAL LOW (ref 3.5–5.1)
Sodium: 138 mmol/L (ref 135–145)

## 2021-09-19 LAB — PHOSPHORUS: Phosphorus: 4 mg/dL (ref 2.5–4.6)

## 2021-09-19 LAB — CK: Total CK: 1660 U/L — ABNORMAL HIGH (ref 38–234)

## 2021-09-19 LAB — CBG MONITORING, ED: Glucose-Capillary: 371 mg/dL — ABNORMAL HIGH (ref 70–99)

## 2021-09-19 LAB — MAGNESIUM: Magnesium: 1.2 mg/dL — ABNORMAL LOW (ref 1.7–2.4)

## 2021-09-19 LAB — OSMOLALITY, URINE: Osmolality, Ur: 384 mOsm/kg (ref 300–900)

## 2021-09-19 MED ORDER — LACTATED RINGERS IV BOLUS
500.0000 mL | Freq: Once | INTRAVENOUS | Status: AC
  Administered 2021-09-19: 500 mL via INTRAVENOUS

## 2021-09-19 MED ORDER — LEVETIRACETAM 500 MG PO TABS
500.0000 mg | ORAL_TABLET | Freq: Two times a day (BID) | ORAL | Status: DC
Start: 2021-09-19 — End: 2021-09-19
  Administered 2021-09-19 (×2): 500 mg via ORAL
  Filled 2021-09-19 (×3): qty 1

## 2021-09-19 MED ORDER — MAGNESIUM SULFATE 2 GM/50ML IV SOLN
2.0000 g | Freq: Once | INTRAVENOUS | Status: AC
  Administered 2021-09-19: 2 g via INTRAVENOUS
  Filled 2021-09-19: qty 50

## 2021-09-19 MED ORDER — INSULIN ASPART 100 UNIT/ML IJ SOLN
0.0000 [IU] | Freq: Three times a day (TID) | INTRAMUSCULAR | Status: DC
  Administered 2021-09-20: 7 [IU] via SUBCUTANEOUS

## 2021-09-19 MED ORDER — SENNOSIDES-DOCUSATE SODIUM 8.6-50 MG PO TABS
1.0000 | ORAL_TABLET | Freq: Two times a day (BID) | ORAL | Status: DC
  Administered 2021-09-20 – 2021-09-28 (×10): 1 via ORAL
  Filled 2021-09-19 (×18): qty 1

## 2021-09-19 MED ORDER — LIDOCAINE 5 % EX PTCH
2.0000 | MEDICATED_PATCH | CUTANEOUS | Status: DC
Start: 2021-09-19 — End: 2021-09-29
  Administered 2021-09-19 – 2021-09-28 (×10): 2 via TRANSDERMAL
  Filled 2021-09-19 (×10): qty 2

## 2021-09-19 MED ORDER — PANTOPRAZOLE SODIUM 40 MG PO TBEC
80.0000 mg | DELAYED_RELEASE_TABLET | Freq: Every day | ORAL | Status: DC
  Administered 2021-09-20 – 2021-09-28 (×9): 80 mg via ORAL
  Filled 2021-09-19 (×11): qty 2

## 2021-09-19 MED ORDER — FENTANYL 12 MCG/HR TD PT72
1.0000 | MEDICATED_PATCH | TRANSDERMAL | Status: DC
  Administered 2021-09-19: 1 via TRANSDERMAL
  Filled 2021-09-19: qty 1

## 2021-09-19 MED ORDER — HYDROMORPHONE HCL 2 MG PO TABS
1.0000 mg | ORAL_TABLET | Freq: Once | ORAL | Status: AC
  Administered 2021-09-19: 1 mg via ORAL
  Filled 2021-09-19: qty 1

## 2021-09-19 MED ORDER — INSULIN ASPART 100 UNIT/ML IJ SOLN
15.0000 [IU] | Freq: Once | INTRAMUSCULAR | Status: AC
  Administered 2021-09-19: 15 [IU] via SUBCUTANEOUS

## 2021-09-19 MED ORDER — LEVETIRACETAM 500 MG PO TABS
750.0000 mg | ORAL_TABLET | Freq: Two times a day (BID) | ORAL | Status: DC
  Administered 2021-09-20 – 2021-09-28 (×18): 750 mg via ORAL
  Filled 2021-09-19 (×18): qty 1

## 2021-09-19 MED ORDER — OXYCODONE HCL 5 MG PO TABS: 10.0000 mg | ORAL_TABLET | Freq: Four times a day (QID) | ORAL | Status: DC | PRN

## 2021-09-19 MED ORDER — POTASSIUM CHLORIDE CRYS ER 20 MEQ PO TBCR
40.0000 meq | EXTENDED_RELEASE_TABLET | Freq: Once | ORAL | Status: AC
Start: 2021-09-19 — End: 2021-09-19
  Administered 2021-09-19: 40 meq via ORAL
  Filled 2021-09-19: qty 2

## 2021-09-19 MED ORDER — DULOXETINE HCL 60 MG PO CPEP
60.0000 mg | ORAL_CAPSULE | Freq: Two times a day (BID) | ORAL | Status: DC
  Administered 2021-09-19 – 2021-09-26 (×15): 60 mg via ORAL
  Filled 2021-09-19 (×16): qty 1

## 2021-09-19 MED ORDER — ASPIRIN 81 MG PO TBEC
81.0000 mg | DELAYED_RELEASE_TABLET | Freq: Every day | ORAL | Status: DC
  Administered 2021-09-19 – 2021-09-28 (×10): 81 mg via ORAL
  Filled 2021-09-19 (×10): qty 1

## 2021-09-19 MED ORDER — OXYCODONE HCL 5 MG PO TABS
10.0000 mg | ORAL_TABLET | Freq: Four times a day (QID) | ORAL | Status: DC | PRN
  Administered 2021-09-19 – 2021-09-27 (×26): 10 mg via ORAL
  Filled 2021-09-19 (×27): qty 2

## 2021-09-19 MED ORDER — MIDODRINE HCL 5 MG PO TABS
5.0000 mg | ORAL_TABLET | Freq: Three times a day (TID) | ORAL | Status: DC
  Administered 2021-09-19 – 2021-09-23 (×12): 5 mg via ORAL
  Filled 2021-09-19 (×13): qty 1

## 2021-09-19 MED ORDER — POTASSIUM CHLORIDE CRYS ER 20 MEQ PO TBCR
20.0000 meq | EXTENDED_RELEASE_TABLET | Freq: Once | ORAL | Status: AC
  Administered 2021-09-19: 20 meq via ORAL
  Filled 2021-09-19: qty 1

## 2021-09-19 MED ORDER — INSULIN GLARGINE-YFGN 100 UNIT/ML ~~LOC~~ SOLN
5.0000 [IU] | Freq: Every day | SUBCUTANEOUS | Status: DC
Start: 2021-09-19 — End: 2021-09-19
  Filled 2021-09-19: qty 0.05

## 2021-09-19 MED ORDER — ATORVASTATIN CALCIUM 10 MG PO TABS
20.0000 mg | ORAL_TABLET | Freq: Every day | ORAL | Status: DC
Start: 2021-09-19 — End: 2021-09-29
  Administered 2021-09-19 – 2021-09-28 (×10): 20 mg via ORAL
  Filled 2021-09-19 (×10): qty 2

## 2021-09-19 MED ORDER — LEVOTHYROXINE SODIUM 112 MCG PO TABS
137.0000 ug | ORAL_TABLET | Freq: Every day | ORAL | Status: DC
  Administered 2021-09-19 – 2021-09-28 (×10): 137 ug via ORAL
  Filled 2021-09-19 (×10): qty 1

## 2021-09-19 MED ORDER — INSULIN ASPART 100 UNIT/ML IJ SOLN
0.0000 [IU] | Freq: Every day | INTRAMUSCULAR | Status: DC
  Administered 2021-09-20: 2 [IU] via SUBCUTANEOUS

## 2021-09-19 MED ORDER — INSULIN GLARGINE-YFGN 100 UNIT/ML ~~LOC~~ SOLN
20.0000 [IU] | Freq: Every day | SUBCUTANEOUS | Status: DC
  Administered 2021-09-19: 20 [IU] via SUBCUTANEOUS
  Filled 2021-09-19 (×2): qty 0.2

## 2021-09-19 MED ORDER — POTASSIUM CHLORIDE CRYS ER 20 MEQ PO TBCR
40.0000 meq | EXTENDED_RELEASE_TABLET | Freq: Four times a day (QID) | ORAL | Status: AC
  Administered 2021-09-19 (×2): 40 meq via ORAL
  Filled 2021-09-19 (×2): qty 2

## 2021-09-19 NOTE — Evaluation (Signed)
Occupational Therapy Evaluation Patient Details Name: [name] MRN: [id number] DOB: 06-28-1970 Today's Date: 09/19/2021   History of Present Illness 51 Y/O F was brought in 09/18/21  for  B/L knee pain s/p fall at home following a seizure episode. Reports 4 syncopal episodes in past 2 weeks (?seizures). On the floor for two days. +AKI; dehydration; rhabdomyolysis; uncontrolled DM; xray knee shows severe arthritis  PMH- morbid obesity, DM, HTN, chronic pain syndrome, seizure disorder   Clinical Impression   PT admitted with seizures with prolonged time down on ground. Pt currently with functional limitiations due to the deficits listed below (see OT problem list). Pt was mod I prior to admission taking care of home and dog. Recommending intense therapy to return home level mod I. Pt at baseline takes oxycodone and fentanyl patch and currently without either due to BP prior to session. Pt has used pain medications since 2003 per patient. Pt with bil LE wrapped see vitals with stable BP pressures. Ted hose ordered and in the room.  Pt will benefit from skilled OT to increase their independence and safety with adls and balance to allow discharge CIR.       Recommendations for follow up therapy are one component of a multi-disciplinary discharge planning process, led by the attending physician.  Recommendations may be updated based on patient status, additional functional criteria and insurance authorization.   Follow Up Recommendations  Acute inpatient rehab (3hours/day)    Assistance Recommended at Discharge    Patient can return home with the following A lot of help with walking and/or transfers;A lot of help with bathing/dressing/bathroom;Two people to help with bathing/dressing/bathroom;Assist for transportation    Functional Status Assessment  Patient has had a recent decline in their functional status and demonstrates the ability to make significant improvements in function in a  reasonable and predictable amount of time.  Equipment Recommendations  Other (comment) (tba)    Recommendations for Other Services Rehab consult     Precautions / Restrictions Precautions Precautions: Fall;Other (comment) Precaution Comments: seizure Required Braces or Orthoses: Other Brace Other Brace: uses R AFO when walking; pt to have someone bring it in and her shoes Restrictions Weight Bearing Restrictions: No      Mobility Bed Mobility Overal bed mobility: Needs Assistance Bed Mobility: Supine to Sit, Sit to Supine     Supine to sit: Min assist, +2 for physical assistance Sit to supine: Mod assist   General bed mobility comments: assist to raise torso and to scoot legs over EOB and bring pelvis around for feet to reach the floor; assist with lifting legs on return to supine    Transfers Overall transfer level: Needs assistance Equipment used: Rolling walker (2 wheels) Transfers: Sit to/from Stand Sit to Stand: Min assist, +2 physical assistance, From elevated surface           General transfer comment: bed elevated ~5" to allow pt to get her feet underneath her better      Balance Overall balance assessment: Needs assistance Sitting-balance support: No upper extremity supported, Feet supported Sitting balance-Leahy Scale: Fair     Standing balance support: Bilateral upper extremity supported, Reliant on assistive device for balance Standing balance-Leahy Scale: Poor                             ADL either performed or assessed with clinical judgement   ADL Overall ADL's : Needs assistance/impaired Eating/Feeding: Independent  Grooming: Dance movement psychotherapist;Set up   Upper Body Bathing: Moderate assistance;Bed level   Lower Body Bathing: Maximal assistance;Sit to/from stand   Upper Body Dressing : Moderate assistance;Bed level   Lower Body Dressing: Maximal assistance;Sit to/from Health and safety inspector Details (indicate cue type and  reason): unable to transfer this session with RW. Recommend bed pan until further assessed for safety. if pt is able to tolerate a stedy for safety due to BIL LE pain                 Vision Baseline Vision/History: 1 Wears glasses Patient Visual Report: Diplopia Vision Assessment?: Yes Eye Alignment: Impaired (comment) Tracking/Visual Pursuits: Able to track stimulus in all quads without difficulty Diplopia Assessment: Other (comment) (denies diplopia at this time. pt reports diplopia started with the seizures and increased in keppra had caused it to improve. pt reports no visual glasses or intervention for the diplopia at this time) Additional Comments: reading and driving at night     Perception     Praxis      Pertinent Vitals/Pain Pain Assessment Pain Assessment: Faces Faces Pain Scale: Hurts whole lot Pain Location: bil knees Pain Descriptors / Indicators: Burning, Shooting Pain Intervention(s): Limited activity within patient's tolerance     Hand Dominance Right   Extremity/Trunk Assessment Upper Extremity Assessment Upper Extremity Assessment: Overall WFL for tasks assessed   Lower Extremity Assessment Lower Extremity Assessment: Defer to PT evaluation RLE Deficits / Details: strength assessment limited by incr pain; able to flex knee ~80 degrees in sitting; RLE: Unable to fully assess due to pain LLE Deficits / Details: strength assessment limited by incr pain; able to flex knee ~80 degrees in sitting LLE: Unable to fully assess due to pain   Cervical / Trunk Assessment Cervical / Trunk Assessment: Other exceptions Cervical / Trunk Exceptions: body habitus   Communication Communication Communication: No difficulties   Cognition Arousal/Alertness: Awake/alert Behavior During Therapy: WFL for tasks assessed/performed Overall Cognitive Status: Within Functional Limits for tasks assessed                                       General  Comments  see vitals flowsheet for orthostatic BPs (appropriate response with bil LEs wrapped with 2 ace wraps per leg)    Exercises     Shoulder Instructions      Home Living Family/patient expects to be discharged to:: Private residence Living Arrangements: Alone Available Help at Discharge: Friend(s);Available PRN/intermittently Type of Home: Mobile home Home Access: Stairs to enter Entrance Stairs-Number of Steps: 4 Entrance Stairs-Rails: Right;Left Home Layout: One level     Bathroom Shower/Tub: Occupational psychologist: Handicapped height     Home Equipment: Conservation officer, nature (2 wheels);Cane - quad;Shower seat;Tub bench;Wheelchair Probation officer (4 wheels);BSC/3in1;Hand held shower head   Additional Comments: [place]      Prior Functioning/Environment Prior Level of Function : Independent/Modified Independent;Driving             Mobility Comments: pt reports using quad cane in the home, using electric scooter at grocery store or w/c for community distances ADLs Comments: pt reports requires some assist with cooking, house cleaning at baseline from boyfriend but recently broke up; pt reports ind with self care tasks        OT Problem List: Decreased strength;Decreased activity tolerance;Impaired balance (sitting and/or standing);Decreased range of  motion;Decreased safety awareness;Decreased knowledge of use of DME or AE;Decreased knowledge of precautions;Obesity;Pain      OT Treatment/Interventions: Self-care/ADL training;Therapeutic exercise;Neuromuscular education;Energy conservation;DME and/or AE instruction;Manual therapy;Modalities;Therapeutic activities;Patient/family education;Balance training;Visual/perceptual remediation/compensation    OT Goals(Current goals can be found in the care plan section) Acute Rehab OT Goals Patient Stated Goal: to get strong enough to go back home. pt reports feeling discouraged after losing 100+  pounds on her own OT Goal Formulation: With patient Time For Goal Achievement: 10/03/21 Potential to Achieve Goals: Good  OT Frequency: Min 2X/week    Co-evaluation PT/OT/SLP Co-Evaluation/Treatment: Yes Reason for Co-Treatment: Complexity of the patient's impairments (multi-system involvement);For patient/therapist safety   OT goals addressed during session: ADL's and self-care;Proper use of Adaptive equipment and DME;Strengthening/ROM      AM-PAC OT "6 Clicks" Daily Activity     Outcome Measure Help from another person eating meals?: None Help from another person taking care of personal grooming?: None Help from another person toileting, which includes using toliet, bedpan, or urinal?: A Lot Help from another person bathing (including washing, rinsing, drying)?: A Lot Help from another person to put on and taking off regular upper body clothing?: A Lot Help from another person to put on and taking off regular lower body clothing?: A Lot 6 Click Score: 16   End of Session Equipment Utilized During Treatment: Rolling walker (2 wheels) Nurse Communication: Mobility status;Precautions  Activity Tolerance: Patient limited by pain Patient left: in bed;with call bell/phone within reach  OT Visit Diagnosis: Unsteadiness on feet (R26.81);Muscle weakness (generalized) (M62.81);Pain Pain - Right/Left: Right Pain - part of body: Leg                Time: 1002-1045 OT Time Calculation (min): 43 min Charges:  OT General Charges $OT Visit: 1 Visit OT Evaluation $OT Eval Moderate Complexity: 1 Mod   Brynn, OTR/L  Acute Rehabilitation Services Office: [phone] .   Jeri Modena 09/19/2021, 12:00 PM

## 2021-09-19 NOTE — Hospital Course (Addendum)
[name] is a 51 y.o. female who p/w with AKI, dehydration, rhabdomyolysis in the setting of syncopal episode leaving her immobilized for 2 days without p.o. intake.  Pre-renal AKI Initial labs showed worseing kidney function with creatinine of 6.47(Baseline ~1.7), BUN 44, GFR of 7.  Nephrology was consulted.  In the ED, was given 1 L LR bolus and started on maintenance IV fluids with significant improvement in creatinine the next day. By discharge, AKI resolved creatinine of 1.35.  Rhabdomyolysis Initial CK of 2597 which improved after receiving maintenance IV fluids.  Creatinine trended downward with creatinine of 621 on day 3.    Loss of Consciousness X-ray imaging in ED showed no acute fractures of BLEs or lumbar spine.  Echocardiogram was ordered which showed LVEF 55-60%, no regional wall motion abnormalities. EEG normal as below. Suspect 2/2 decreased PO intake while on severe diet leading to orthostasis though medication effects could also be contributing. By discharge, she was feeling well. Decreased cymbalta to 60 mg, 70/30 insulin to 40 u with close PCP follow up.  Seizures Patient was continued on home Keppra 750 mg twice daily and neurology was consulted.  Keppra level was low at 5.6 however patient had not taken Keppra for 2 days prior when she was immobile.  EEG showed moderate diffuse encephalopathy with nonspecific etiology, did not show seizures or epileptiform discharges.  Patient was discharged on home Keppra 750 mg twice daily.  Hypotension Patient was persistently hypotensive, was placed on midodrine 5 mg 3 times daily by nephrology. BP remained soft, midodrine increased to 10 mg TID. AM cortisol was checked and was low at 3, ACTH stim test was negative. Hypotension improved by discharge with increased PO intake, decreased medication dosages.  T2DM Patient had a hemoglobin A1c of 7.9 on admission, down from 12.4 in February 2023.  Per patient, she has been taking 70/30  insulin 75 units 3 times daily. Patient was initially treated with SSI and Semglee 20 units daily.  She was then transitioned to 70/30 insulin at 40u at discharge given continued higher BG but need for reduction given potential hypoglycemia contributing to above LOC.  Urinary retention, UTI Throughout the hospitalization, patient required in and out catheterization intermittently due to urinary retention. Patient began experiencing dysuria, UA with findings of UTI. Keflex > cefadroxil for treatment outpatient at discharge.  Electrolyte derangements Low potassium and magnesium repleted appropriately and stable on discharge.  Issues for follow-up: 1. Continue to discuss healthy ways to lose weight 2. Follow up aldosterone renin activity ratio, collected 7/01 while admitted 3. Reassess anxiety levels on Buspar 4. Check BG control on lower insulin, simplify regimen 5. Follow up UTI symptoms, culture 6. Recommend decreasing narcotic dosing- patient is on very high MME that is likely a main culprit in falls and deconditioning 7. Recommend decreasing midodrine slowly for BP

## 2021-09-19 NOTE — Progress Notes (Signed)
  Transition of Care [facility]) Screening Note   Patient Details  Name: [name] Date of Birth: November 17, 1970   [facility]) CM/SW Contact:    Cyndi Bender, RN Phone Number: 09/19/2021, 8:36 AM    Transition of Care Department [facility]) has reviewed patient and no TOC needs have been identified at this time. We will continue to monitor patient advancement through interdisciplinary progression rounds. If new patient transition needs arise, please place a TOC consult.

## 2021-09-19 NOTE — Progress Notes (Signed)
Interim Progress Note  D/w RN who reports >500cc on bladder scan. RN states patient would like to attempt to urinate. Discussed that if she is unable to, she will need straight cath. Will continue bladder scans q8h, if she continues to retain with 300cc or more, will need foley placement.

## 2021-09-19 NOTE — Assessment & Plan Note (Addendum)
No seizure activity this hospitalization. Stable on current regimen. -Continue Keppra 750 mg BID

## 2021-09-19 NOTE — Progress Notes (Signed)
Interim Progress Note  Received secure chat from RN stating that patient was still having 10/10 pain and requesting IV pain medication. Requested updated BP and RN informed me that it was 86/30 with a MAP 46. Manual BP then checked which was 80/50 with a MAP of 60. Placed order for 500 cc LR bolus and went in to see patient. Discussed with patient that options for additional pain control are limited given her significant hypotension. She has required ICU stay in the past for shock necessitating pressors. She was slightly tearful due to the pain but understanding of the situation. She denied any lightheadedness, dizziness. Offered her Lidocaine patches which she is amenable to using- states she uses them at home as well.   Order placed for Lidocaine patches to b/l knes. 500 cc LR bolus currently running.   AM BMP shows slightly improved renal function with creatinine 4.11. Still with anion gap metabolic acidosis and hypokalemia to 2.8. I have put in for 20 mEq potassium repletion. She remains hyperglycemic- has received a total of 30U Novolog overnight. Will need to adjust insulin regimen this AM.

## 2021-09-19 NOTE — Progress Notes (Addendum)
FMTS Brief Progress Note  S: Patient is awake and laying comfortably in bed.  Patient says she is doing much better, back pain is improved however she still have significant pain on both knees. She also  reports voiding twice today without dysuria.    O: BP (!) 76/48 (BP Location: Right Arm)   Pulse 80   Temp 98 F (36.7 C) (Oral)   Resp 19   Ht '5\' 6"'$  (1.676 m)   Wt (!) 155.6 kg   SpO2 91%   BMI 55.36 kg/m   General: Alert, well appearing, NAD CV: RRR, no murmurs, normal S1/S2 Pulm: CTAB, good WOB on RA,  Ext: No BLE edema, Knee joints are mildly tender to touch, otherwise no noticeable edema or erythema.  A/P: #AKI Improved with Cr 1.99 (6.8 on admission)  #Seizure EEG showed no epileptiforms or seizure Continue home Keppra '500mg'$  BID  -Continue Plans per day team - Orders reviewed. Labs for AM ordered, which was adjusted as needed.    Alen Bleacher, MD 09/19/2021, 10:53 PM PGY-1, [place] Night Resident  Please page [phone] with questions.

## 2021-09-19 NOTE — Inpatient Diabetes Management (Addendum)
Inpatient Diabetes Program Recommendations  AACE/ADA: New Consensus Statement on Inpatient Glycemic Control (2015)  Target Ranges:  Prepandial:   less than 140 mg/dL      Peak postprandial:   less than 180 mg/dL (1-2 hours)      Critically ill patients:  140 - 180 mg/dL   Lab Results  Component Value Date   GLUCAP 335 (H) 09/19/2021   HGBA1C 12.4 (H) 05/14/2021    Review of Glycemic Control  Diabetes history: DM 2 Outpatient Diabetes medications: Lantus 75 units tid (verified by pt) Januvia 50 mg Daily, Ozempic 0.25 weekly Current orders for Inpatient glycemic control:  Semglee 20 units Novolog 0-15 units tid  A1c 12.4% on 05/14/21 Pt sees Dr. Chalmers Cater Endocrinology for DM control  -   Consider increasing Semglee to 20 units bid -   Add Novolog 5 units tid meal coverage if eating >50% of meals   Spoke with pt at bedside regarding A1c level and glucose control at home. Pt reports seeing Dr. Chalmers Cater every 3 months for diabetes control. Pt reports starting on Ozempic about 3-4 months ago. Collectively pt has lost over 100 pounds. Pt reports having more hypoglycemia lately. I reviewed with pt that as her weight goes down she may not need to be on as much insulin.   Thanks,  Tama Headings RN, MSN, BC-ADM Inpatient Diabetes Coordinator Team Pager [phone] (8a-5p)

## 2021-09-19 NOTE — Progress Notes (Signed)
? ?  Inpatient Rehab Admissions Coordinator : ? ?Per therapy recommendations, patient was screened for CIR candidacy by Nesanel Aguila RN MSN.  At this time patient appears to be a potential candidate for CIR. I will place a rehab consult per protocol for full assessment. Please call me with any questions. ? ?Kristian Hazzard RN MSN ?Admissions Coordinator ?[phone] ?  ?

## 2021-09-19 NOTE — Progress Notes (Addendum)
Interim Progress Note  There has been a delay in bladder scan as the ED bladder scanner is apparently dead/not working, per [employer]. RN notes that patient has bed placement and will notify floor RN to do bladder scan. There are no documented outputs yet for this patient. Renal U/S was negative but concern that patient may be retaining so will await bladder scan.

## 2021-09-19 NOTE — Evaluation (Signed)
Physical Therapy Evaluation Patient Details Name: [name] MRN: [id number] DOB: 05-07-1970 Today's Date: 09/19/2021  History of Present Illness  .51 Y/O F was brought in 09/18/21  for  B/L knee pain s/p fall at home following a seizure episode. Reports 4 syncopal episodes in past 2 weeks (?seizures). On the floor for two days. +AKI; dehydration; rhabdomyolysis; uncontrolled DM; xray knee shows severe arthritis  PMH- morbid obesity, DM, HTN, chronic pain syndrome, seizure disorder  Clinical Impression   Pt admitted secondary to problem above with deficits below. PTA patient was living alone and ambulating inside home with quad cane and Rt AFO. She would use her wheelchair in the community Patent attorney at grocery store).  Pt currently requires +2 assist for bed mobility and transfers with bil knee pain (severe) limiting her ability to ambulate. BP increased appropriately with changes in position with both legs wrapped with ace wraps (TED hose arrived at end of session). Patient is highly motivated and limited by pain. Feel if pain can be addressed, she will make steady progress and benefit from AIR.  Anticipate patient will benefit from PT to address problems listed below.Will continue to follow acutely to maximize functional mobility independence and safety.          Recommendations for follow up therapy are one component of a multi-disciplinary discharge planning process, led by the attending physician.  Recommendations may be updated based on patient status, additional functional criteria and insurance authorization.  Follow Up Recommendations Acute inpatient rehab (3hours/day)      Assistance Recommended at Discharge Intermittent Supervision/Assistance  Patient can return home with the following  A lot of help with walking and/or transfers;Assistance with cooking/housework;Assist for transportation;Help with stairs or ramp for entrance    Equipment Recommendations None  recommended by PT  Recommendations for Other Services  Rehab consult    Functional Status Assessment Patient has had a recent decline in their functional status and demonstrates the ability to make significant improvements in function in a reasonable and predictable amount of time.     Precautions / Restrictions Precautions Precautions: Fall;Other (comment) Precaution Comments: seizure Required Braces or Orthoses: Other Brace Other Brace: uses R AFO when walking; pt to have someone bring it in and her shoes Restrictions Weight Bearing Restrictions: No      Mobility  Bed Mobility Overal bed mobility: Needs Assistance Bed Mobility: Supine to Sit, Sit to Supine     Supine to sit: Min assist, +2 for physical assistance Sit to supine: Mod assist   General bed mobility comments: assist to raise torso and to scoot legs over EOB and bring pelvis around for feet to reach the floor; assist with lifting legs on return to supine    Transfers Overall transfer level: Needs assistance Equipment used: Rolling walker (2 wheels) Transfers: Sit to/from Stand Sit to Stand: Min assist, +2 physical assistance, From elevated surface           General transfer comment: bed elevated ~5" to allow pt to get her feet underneath her better    Ambulation/Gait               General Gait Details: unable to progress to gait due to severe bil knee pain  Stairs            Wheelchair Mobility    Modified Rankin (Stroke Patients Only)       Balance Overall balance assessment: Needs assistance Sitting-balance support: No upper extremity supported, Feet supported Sitting balance-Leahy Scale:  Fair     Standing balance support: Bilateral upper extremity supported, Reliant on assistive device for balance Standing balance-Leahy Scale: Poor                               Pertinent Vitals/Pain Pain Assessment Pain Assessment: Faces Faces Pain Scale: Hurts whole lot Pain  Location: bil knees Pain Descriptors / Indicators: Burning, Shooting Pain Intervention(s): Limited activity within patient's tolerance, Monitored during session, Premedicated before session (lidocaine patches to bil knees; could not have narcotics due to low BP)    Home Living Family/patient expects to be discharged to:: Private residence Living Arrangements: Alone Available Help at Discharge: Friend(s);Available PRN/intermittently Type of Home: Mobile home Home Access: Stairs to enter Entrance Stairs-Rails: Right;Left Entrance Stairs-Number of Steps: 4   Home Layout: One level Home Equipment: Conservation officer, nature (2 wheels);Cane - quad;Shower seat;Tub bench;Wheelchair Probation officer (4 wheels);BSC/3in1;Hand held shower head Additional Comments: [place]    Prior Function Prior Level of Function : Independent/Modified Independent;Driving             Mobility Comments: pt reports using quad cane in the home, using electric scooter at grocery store or w/c for community distances ADLs Comments: pt reports requires some assist with cooking, house cleaning at baseline from boyfriend but recently broke up; pt reports ind with self care tasks     Hand Dominance   Dominant Hand: Right    Extremity/Trunk Assessment   Upper Extremity Assessment Upper Extremity Assessment: Defer to OT evaluation    Lower Extremity Assessment Lower Extremity Assessment: RLE deficits/detail;LLE deficits/detail;Generalized weakness RLE Deficits / Details: strength assessment limited by incr pain; able to flex knee ~80 degrees in sitting; RLE: Unable to fully assess due to pain LLE Deficits / Details: strength assessment limited by incr pain; able to flex knee ~80 degrees in sitting LLE: Unable to fully assess due to pain    Cervical / Trunk Assessment Cervical / Trunk Assessment: Other exceptions Cervical / Trunk Exceptions: overweight  Communication   Communication: No difficulties   Cognition Arousal/Alertness: Awake/alert Behavior During Therapy: WFL for tasks assessed/performed Overall Cognitive Status: Within Functional Limits for tasks assessed                                          General Comments General comments (skin integrity, edema, etc.): see vitals flowsheet for orthostatic BPs (appropriate response with bil LEs wrapped with 2 ace wraps per leg)    Exercises     Assessment/Plan    PT Assessment Patient needs continued PT services  PT Problem List Decreased range of motion;Decreased activity tolerance;Decreased balance;Decreased mobility;Decreased knowledge of use of DME;Obesity;Pain       PT Treatment Interventions DME instruction;Gait training;Stair training;Functional mobility training;Therapeutic activities;Therapeutic exercise;Balance training;Patient/family education    PT Goals (Current goals can be found in the Care Plan section)  Acute Rehab PT Goals Patient Stated Goal: to return home from hospital without need for short-term SNF PT Goal Formulation: With patient Time For Goal Achievement: 10/03/21 Potential to Achieve Goals: Good    Frequency Min 3X/week     Co-evaluation               AM-PAC PT "6 Clicks" Mobility  Outcome Measure Help needed turning from your back to your side while in a flat bed without using  bedrails?: A Lot Help needed moving from lying on your back to sitting on the side of a flat bed without using bedrails?: Total Help needed moving to and from a bed to a chair (including a wheelchair)?: Total Help needed standing up from a chair using your arms (e.g., wheelchair or bedside chair)?: Total Help needed to walk in hospital room?: Total   6 Click Score: 6    End of Session   Activity Tolerance: Patient limited by pain Patient left: in bed;with call bell/phone within reach;with bed alarm set;Other (comment) (with OT) Nurse Communication: Mobility status;Other (comment) (use RW  for bed to [facility] transfers) PT Visit Diagnosis: Other abnormalities of gait and mobility (R26.89);Pain Pain - Right/Left:  (bil) Pain - part of body: Knee    Time: 1749-4496 PT Time Calculation (min) (ACUTE ONLY): 39 min   Charges:   PT Evaluation $PT Eval Moderate Complexity: [place], PT Acute Rehabilitation Services  Office [phone]   Rexanne Mano 09/19/2021, 11:30 AM

## 2021-09-19 NOTE — Progress Notes (Signed)
EEG complete - results pending 

## 2021-09-19 NOTE — Progress Notes (Signed)
   09/19/21 0403  Assess: MEWS Score  Temp 98.5 F (36.9 C)  BP (!) 59/50  MAP (mmHg) (!) 55  Pulse Rate 95  ECG Heart Rate 95  Resp 18  Level of Consciousness Alert  SpO2 96 %  O2 Device Room Air  Assess: MEWS Score  MEWS Temp 0  MEWS Systolic 3  MEWS Pulse 0  MEWS RR 0  MEWS LOC 0  MEWS Score 3  MEWS Score Color Yellow  Assess: if the MEWS score is Yellow or Red  Were vital signs taken at a resting state? No  Focused Assessment Change from prior assessment (see assessment flowsheet)  Does the patient meet 2 or more of the SIRS criteria? No  MEWS guidelines implemented *See Row Information* Yes  Treat  MEWS Interventions Administered prn meds/treatments  Pain Scale 0-10  Pain Score 10  Pain Type Acute pain  Pain Location Leg  Pain Orientation Left;Right  Pain Descriptors / Indicators Aching  Pain Frequency Intermittent  Pain Onset On-going  Pain Intervention(s) MD notified (Comment);Relaxation  Multiple Pain Sites No  Take Vital Signs  Increase Vital Sign Frequency  Yellow: Q 2hr X 2 then Q 4hr X 2, if remains yellow, continue Q 4hrs  Escalate  MEWS: Escalate Yellow: discuss with charge nurse/RN and consider discussing with provider and RRT  Notify: Charge Nurse/RN  Name of Charge Nurse/RN Notified April RN  Date Charge Nurse/RN Notified 09/19/21  Time Charge Nurse/RN Notified 0430  Notify: Provider  Provider Name/Title Sharion Settler DO  Date Provider Notified 09/19/21  Time Provider Notified 0411  Method of Notification Page (secured chat)  Notification Reason Change in status  Provider response See new orders  Date of Provider Response 09/19/21  Time of Provider Response [phone]  Document  Patient Outcome Other (Comment) (continued to monitor)  Assess: SIRS CRITERIA  SIRS Temperature  0  SIRS Pulse 1  SIRS Respirations  0  SIRS WBC 0  SIRS Score Sum  1

## 2021-09-19 NOTE — Procedures (Signed)
Patient Name: [name]  MRN: [id number]  Epilepsy Attending: Lora Havens  Referring Physician/Provider: Kerney Elbe, MD  Date: 09/19/2021 Duration: 32.08 mins  Patient history: 51yo patient reports having 4 episodes of loss of consciousness in the last 2 weeks which patient attributes to seizure activity. EEG to evaluate for seizure  Level of alertness: Awake, asleep  AEDs during EEG study: LEV  Technical aspects: This EEG study was done with scalp electrodes positioned according to the 10-20 International system of electrode placement. Electrical activity was acquired at a sampling rate of '500Hz'$  and reviewed with a high frequency filter of '70Hz'$  and a low frequency filter of '1Hz'$ . EEG data were recorded continuously and digitally stored.   Description: No posterior dominant rhythm was seen.  Sleep was characterized by vertex waves, sleep spindles (12 to 14 Hz), maximal frontocentral region. EEG showed continuous generalized 3 to 6 Hz theta-delta slowing. Hyperventilation and photic stimulation were not performed.     ABNORMALITY - Continuous slow, generalized  IMPRESSION: This study is suggestive of moderate diffuse encephalopathy, nonspecific etiology. No seizures or epileptiform discharges were seen throughout the recording.  [name] [name]

## 2021-09-19 NOTE — Progress Notes (Signed)
Daily Progress Note Intern Pager: [phone]  Patient name: [name] Medical record number: [id number] Date of birth: December 09, 1970 Age: 51 y.o. Gender: female  Primary Care Provider: Pecolia Ades, NP Consultants: Nephrology Code Status: Full  Pt Overview and Major Events to Date:  09/18/21 - admitted  Assessment and Plan: [name] is a 51 y.o. female presenting with AKI. Differential for this patient's AKI includes prerenal due to dehydration, ATN secondary to rhabdomyolysis and postobstructive AKI which is less likely given the absence of hydronephrosis on renal US.  * AKI (acute kidney injury) ([place]) Patient presented immobilized for 2 days after fall and was without p.o. intake.  Patient received maintenance IV fluids overnight, creatinine improved from 6.47 > 4.11 (Baseline ~1.7), GFR improved from 7> 12.  Overnight patient had 586 cc retained urine, had In-N-Out x1.  Will monitor bladder scans today and need for Foley.  Patient's AKI is likely prerenal in the setting of dehydration. Other contributing factors to AKI include rhabdomyolysis and hyperglycemia. -Nephrology following, appreciate recs -Follow-up UA with microscopy -Avoid nephrotoxic agents -Bladder scan Q8H -Heparin for VTE -Maintenance IV fluid of LR at 151m/hr   Rhabdomyolysis CK improved from 2597> 1660 this morning after receiving maintenance IV fluids overnight. Will continue fluid resuscitation, monitor renal function and trend CK closely. -Trend CK -Repeat BMP -Follow-up UA with microscopy -Continue maintenance IV fluid  Loss of consciousness ([place] Patient reports having 4 episodes of loss of consciousness in the last 2 weeks which patient attributes to seizure activity.  Home regimen includes Keppra 750 mg twice daily, patient reports compliance.  Consider decreased p.o. intake and insomnia as cause of LOC. -Follow-up orthostatic vitals -Follow up Keppra level -Seizure  precautions -Neurochecks - echocardiogram ordered.  Seizure disorder ([name] Home meds include Keppra 500 mg BID. Per pt, thinks she has had several seizures over past two weeks when she was previously stable.  -Continue home keppra -Will consult neurology today  Hypokalemia Potassium 3.1> 2.8 this morning.  She is s/p 20 mEq potassium last night and again this morning.  Nephrology has ordered another 40 mEq every 6 for 2 doses. -Continue close monitoring of BMP -Replenish K when appropriate.  Poorly controlled type 2 diabetes mellitus ([name] Home medication include Norvolog mix 70/30 75unit TID and recently started weekly Ozempic. Patient reports eating only a plate of salad daily in the last couple of weeks in an attempt to lose weight. Last reported use of her home insulin was 3 days ago.  Glucose 215 this morning, down from 395 on admission.  Has received 15 units short acting after midnight and another 15 units around 3 AM.  We will add semglee this morning and consider initiating home 70/30 tomorrow. -POCT CBG Q4H. -Follow up A1c -Semglee 20 U this morning -continue mSSI   Chronic pain Patient with severe arthritis of the lower back and bilateral knee.  Home medication include chronic oxycodone 10 mg every 6 hours as needed, fentanyl patch every Q3D and lidocaine patch. She endorses having lowerback pain and bilateral knee pain which is more severe on the left knee. Imagining show significant osteoarthritis at the L3- L4 & L4-L5 of lumbar spine and bilateral knee. Received dilauded yesterday, stated it did not help, does not like Dilaudid. -Lidocain patch -Gabapentin '300mg'$  QHS -PT/OT eval and treat -Tylenol 650 mg every 6 hours - d/c Dilaudid  -Continue home oxy as needed    FEN/GI: Carb modified PPx: heparin Dispo:home pending  continued medical management   Subjective:  Patient complains of bilateral knee, leg, foot pain.  States is from when she fell and was immobilized for  2 days.  Feels very weak in the lower extremities.  Also admits to suprapubic pain and states she needs to urinate, has pure wick in place but cannot allow herself to urinate while she is lying down, would like to get up and use the bedside commode.  Objective: Temp:  [98.1 F (36.7 C)-98.6 F (37 C)] 98.1 F (36.7 C) (06/28 0800) Pulse Rate:  [82-102] 82 (06/28 1000) Resp:  [14-23] 19 (06/28 1000) BP: (59-185)/(30-155) 92/47 (06/28 1000) SpO2:  [90 %-98 %] 94 % (06/28 1000) Physical Exam: General: 51 year old female, NAD Cardiovascular: RRR, normal S1/S2 Respiratory: CTAB, normal effort Abdomen: Bowel sounds present, soft, tenderness palpation of suprapubic area, nondistended Extremities: 5/5 muscle strength of BUEs, 2/5 muscle strength BLEs Neuro: Cranial nerves II through XII intact, sensation intact, finger-nose-finger test normal, weakness noted in bilateral lower extremities  Laboratory: Most recent CBC Lab Results  Component Value Date   WBC 11.8 (H) 09/19/2021   HGB 12.8 09/19/2021   HCT 37.9 09/19/2021   MCV 93.3 09/19/2021   PLT 193 09/19/2021   Most recent BMP    Latest Ref Rng & Units 09/19/2021    1:25 AM  BMP  Glucose 70 - 99 mg/dL 413   BUN 6 - 20 mg/dL 43   Creatinine 0.44 - 1.00 mg/dL 4.11   Sodium 135 - 145 mmol/L 134   Potassium 3.5 - 5.1 mmol/L 2.8   Chloride 98 - 111 mmol/L 100   CO2 22 - 32 mmol/L 18   Calcium 8.9 - 10.3 mg/dL 8.5     Precious Gilding, DO 09/19/2021, 1:59 PM  PGY-1, [place] Intern pager: [phone], text pages welcome Secure chat group [place]

## 2021-09-19 NOTE — Progress Notes (Signed)
Nephrology Follow-Up Consult note   Assessment/Recommendations: [name] is a/an 51 y.o. female with a past medical history significant for DM2, HTN, obesity, chronic pain, multiple AKIs, admitted for dizziness and falls.       Non-Oliguric AKI: Likely prerenal for the most part.  Creatinine improving significantly from 6.5-4.1.  Not emptying bladder appropriately so some obstruction may be contributing -Continue with fluids as ordered -Continue to monitor daily Cr, Dose meds for GFR -Monitor Daily I/Os, Daily weight  -Maintain MAP>65 for optimal renal perfusion.  -Avoid nephrotoxic medications including NSAIDs -Use synthetic opioids (Fentanyl/Dilaudid) if needed -Sitter Foley catheter placement if the patient's PVR remains greater than 300 -Currently no indication for HD  Hypotension: Unclear cause.  Hold home blood pressure medications.  Start midodrine 5 mg 3 times daily  Hypokalemia/hypomagnesemia: Continue with oral and IV repletion as needed  Metabolic acidosis: Likely associated with AKI.  Bicarb improved to 18 today.  No changes  Knee pain: Limit opioids as able.  Uncontrolled Diabetes Mellitus Type 2 with Hyperglycemia: Management per primary team.   Recommendations conveyed to primary service.    [facility] 09/19/2021 11:43 AM  ___________________________________________________________  CC: AKI  Interval History/Subjective: Patient states that her pain in her knees is significant.  Some of her therapy limited by hypotension.  States that her blood pressure at home is not usually this low.  Denies significant dizziness or lightheadedness.  Some issues with emptying bladder appropriately overnight.  Assessing bladder scans regularly.   Medications:  Current Facility-Administered Medications  Medication Dose Route Frequency Provider Last Rate Last Admin   acetaminophen (TYLENOL) tablet 650 mg  650 mg Oral Q6H Alen Bleacher, MD   650 mg at 09/19/21 1013   gabapentin (NEURONTIN) capsule 300 mg  300 mg Oral QHS Alen Bleacher, MD   300 mg at 09/18/21 2236   heparin injection 5,000 Units  5,000 Units Subcutaneous Deon Pilling, MD   5,000 Units at 09/19/21 0504   insulin aspart (novoLOG) injection 0-15 Units  0-15 Units Subcutaneous TID WC Alen Bleacher, MD       insulin glargine-yfgn [facility]) injection 20 Units  20 Units Subcutaneous Daily Sharion Settler, DO   20 Units at 09/19/21 1013   lactated ringers infusion   Intravenous Continuous Alen Bleacher, MD 150 mL/hr at 09/19/21 1142 New Bag at 09/19/21 1142   levETIRAcetam (KEPPRA) tablet 500 mg  500 mg Oral Q12H Rosezetta Schlatter, MD   500 mg at 09/19/21 1140   levothyroxine (SYNTHROID) tablet 137 mcg  137 mcg Oral Q0600 Rosezetta Schlatter, MD   137 mcg at 09/19/21 1140   lidocaine (LIDODERM) 5 % 2 patch  2 patch Transdermal Q24H Sharion Settler, DO   2 patch at 09/19/21 0504   magnesium sulfate IVPB 2 g 50 mL  2 g Intravenous Once Rosezetta Schlatter, MD       midodrine (PROAMATINE) tablet 5 mg  5 mg Oral TID WC Reesa Chew, MD       oxyCODONE (Oxy IR/ROXICODONE) immediate release tablet 10 mg  10 mg Oral Q6H PRN Rosezetta Schlatter, MD   10 mg at 09/19/21 1140   polyethylene glycol (MIRALAX / GLYCOLAX) packet 17 g  17 g Oral Daily Espinoza, Alejandra, DO   17 g at 09/18/21 2236   potassium chloride SA (KLOR-CON M) CR tablet 40 mEq  40 mEq Oral Q6H Reesa Chew, MD   40 mEq at 09/19/21 1013  Review of Systems: 10 systems reviewed and negative except per interval history/subjective  Physical Exam: Vitals:   09/19/21 0800 09/19/21 1000  BP: (!) 71/38 (!) 92/47  Pulse: 92 82  Resp: 19 19  Temp: 98.1 F (36.7 C)   SpO2: 91% 94%   Total I/O In: 1796.9 [I.V.:1796.9] Out: 600 [Urine:600]  Intake/Output Summary (Last 24 hours) at 09/19/2021 1143 Last data filed at 09/19/2021 1000 Gross per 24 hour  Intake 3869.48 ml  Output 1300 ml  Net  2569.48 ml   Constitutional: Tired appearing, no distress, lying in bed ENMT: ears and nose without scars or lesions, MMM CV: normal rate, no edema Respiratory: Bilateral chest rise, normal work of breathing Gastrointestinal: soft, non-tender, no palpable masses or hernias Skin: Some bruising on the knees, otherwise no visible lesions or rashes Psych: alert, judgement/insight appropriate, appropriate mood and affect   Test Results I personally reviewed new and old clinical labs and radiology tests Lab Results  Component Value Date   NA 134 (L) 09/19/2021   K 2.8 (L) 09/19/2021   CL 100 09/19/2021   CO2 18 (L) 09/19/2021   BUN 43 (H) 09/19/2021   CREATININE 4.11 (H) 09/19/2021   CALCIUM 8.5 (L) 09/19/2021   ALBUMIN 3.3 (L) 09/19/2021   PHOS 4.0 09/19/2021    CBC Recent Labs  Lab 09/18/21 1356 09/19/21 0125  WBC 18.4* 11.8*  NEUTROABS 12.6*  --   HGB 14.7 12.8  HCT 44.1 37.9  MCV 94.4 93.3  PLT 274 193

## 2021-09-19 NOTE — Consult Note (Addendum)
NEURO HOSPITALIST CONSULT NOTE   Requestig physician: Dr. Ardelia Mems  Reason for Consult: Breakthrough seizure  History obtained from:  Patient and Chart     HPI:                                                                                                                                          [name] is an 51 y.o. female with a PMHx of seizures since childhood (currently on Keppra), prior skull surgery during childhood ("for early closing of the soft spot"), brain surgery with tumor removal during childhood, prior episodes of AKI, anemia, anxiety, CKD, chronic pain, DM2, fibromyalgia, left sided Bell's palsy, HTN, hypothyroidism, hypoventilation obesity syndrome, sleep apnea, migraine, PVD and right foot drop who presents after being down on the linoleum floor of her kitchen for 2 days unable to get up. Labs reveal AKI and rhabdomyolysis. She states that she had a syncopal episode and woke up almost right away finding herself on the floor too weak to get up. She had a cellphone near her and tried to call her brother to come to her house and help her up, but he did not answer her calls for 2 days. She states that for the entire two days she could not move except for being able to roll herself over onto her back and side from her stomach, on which she landed after initially apparently collapsing on her knees from the syncopal event.   She states to Neurology attending that she is sure that she did not have a seizure as she does not wake up right away after one of her seizures and also has a prodrome during which she can feel the seizure coming on, which she did not feel with this occurrence. On the other hand, she had endorsed to [facility] team that " her most recent event was most likely seizure".   She does endorse a total of 4 episodes of LOC over the last 2 weeks.    Has body aches from being immobile on a linoleum floor for 2 days. Also with severe  bilateral knee pain due to impact of her fall.   She states that she has been compliant with her home Keppra dosing regimen (750 mg BID), but that she missed her doses over the past 2 days due to being immobile. Also with no PO intake of food or water during that time period.   Past Medical History:  Diagnosis Date   Acute renal failure (ARF) ([place]) 09/02/2012   Anemia    Anxiety    panic attacks   Back pain    Blood transfusion    Chronic heel ulcer (HCC)    Chronic kidney disease    Chronic pain syndrome    Depression  Diabetes mellitus    type II    DJD (degenerative joint disease)    Dysrhythmia    pt unsure what this was   Fibromyalgia    GERD (gastroesophageal reflux disease)    H/O: Bell's palsy 2011   Heart murmur    "slight one"   History of 2019 novel coronavirus disease (COVID-19) 03/25/2019   History of kidney stones    History of MRSA infection OF ULCER   Hypertension    no longer taken since 10-11 months per patient at preop phone call of 10/13/2017    Hypothyroidism    Hypoventilation associated with obesity syndrome (HCC)    Migraine    Non-healing non-surgical wound    Right hip, has Wound vac to hip.  Started as a skin tear.   Peripheral vascular disease (HCC)    PONV (postoperative nausea and vomiting)    can be slow to wake up after surgery   Postoperative abscess 10/27/2017   Right foot drop    Shortness of breath    with Activity   Sleep apnea    "study shows not bad enough for CPAP.", pt denies   Umbilical hernia     Past Surgical History:  Procedure Laterality Date   BACK SURGERY     for lumbar disc disease [place]   Tumor removed- has steel plate   BRAIN SURGERY      Plating due to soft spot closing too early- age 39   BREAST LUMPECTOMY WITH NEEDLE LOCALIZATION Right 08/23/2013   Procedure: RIGHT BREAST LUMPECTOMY WITH NEEDLE LOCALIZATION;  Surgeon: Odis Hollingshead, MD;  Location: [place];  Service: General;  Laterality:  Right;   CHOLECYSTECTOMY  9574   EXCISION UMBILICAL NODULE N/A 7/34/0370   Procedure: EXCISION OF UMBILICUS;  Surgeon: Coralie Keens, MD;  Location: [facility];  Service: General;  Laterality: N/A;   EYE SURGERY     eye lift   I & D EXTREMITY  04/02/2011   Procedure: IRRIGATION AND DEBRIDEMENT EXTREMITY;  Surgeon: Mcarthur Rossetti;  Location: [place];  Service: Orthopedics;  Laterality: Right;  I&D right heel ulcer, placement of A-cell graft   INCISION AND DRAINAGE ABSCESS N/A 10/28/2017   Procedure: INCISION AND DRAINAGE UMBILICAL HERNIA ABSCESS;  Surgeon: Ralene Ok, MD;  Location: [place];  Service: General;  Laterality: N/A;   INCISION AND DRAINAGE OF WOUND  08/29/2011   Procedure: IRRIGATION AND DEBRIDEMENT WOUND;  Surgeon: Theodoro Kos, DO;  Location: [place];  Service: Plastics;  Laterality: Right;   INSERTION OF MESH N/A 10/16/2017   Procedure: INSERTION OF MESH;  Surgeon: Coralie Keens, MD;  Location: [facility];  Service: General;  Laterality: N/A;   LITHOTRIPSY     2007ish   right elbow     UMBILICAL HERNIA REPAIR N/A 10/16/2017   Procedure: UMBILICAL HERNIA REPAIR WITH MESH;  Surgeon: Coralie Keens, MD;  Location: [facility];  Service: General;  Laterality: N/A;    Family History  Problem Relation Age of Onset   Diabetes type II Father    Heart attack Father    Peripheral vascular disease Father    Diabetes type II Mother    Anesthesia problems Mother    Heart attack Mother    Peripheral vascular disease Mother    Hypertension Mother            Social History:  reports that she has never smoked. She has never used smokeless tobacco. She reports that  she does not drink alcohol and does not use drugs.  Allergies  Allergen Reactions   Penicillins Hives and Other (See Comments)    HAS PT DEVELOPED SEVERE RASH INVOLVING MUCUS MEMBRANES or SKIN NECROSIS: #  #  YES  #  # PATIENT HAS HAD A PCN REACTION THAT REQUIRED HOSPITALIZATION:  #  #  YES  #  #   Tolerates amoxicillin  on multiple occasions per Dr. Darrel Hoover note 11/01/17.  TDD.   Clindamycin/Lincomycin Hives, Dermatitis and Rash   Nsaids Other (See Comments)    Avoid due to kidney failure caused by celebrex    Sulfa Antibiotics Hives   Dulaglutide Other (See Comments)    unknown   No [employer] Food Allergies Hives    Chicken   Versed [Midazolam] Nausea And Vomiting    Pt had medication on October 16 2017 with no issues    MEDICATIONS:                                                                                                                     Prior to Admission:  Medications Prior to Admission  Medication Sig Dispense Refill Last Dose   acetaminophen (TYLENOL) 500 MG tablet Take 500 mg by mouth every 6 (six) hours as needed for moderate pain.   09/17/2021   amitriptyline (ELAVIL) 25 MG tablet Take 1 tablet (25 mg total) by mouth at bedtime. (Patient taking differently: Take 50 mg by mouth at bedtime.)   Past Week   ASPIRIN LOW DOSE 81 MG EC tablet Take 81 mg by mouth daily.   Past Week   atorvastatin (LIPITOR) 20 MG tablet Take 20 mg by mouth daily.   Past Week   Biotin 10000 MCG TABS Take 10,000 mcg by mouth daily.   Past Week   carisoprodol (SOMA) 350 MG tablet Take 350 mg by mouth every 6 (six) hours as needed for muscle spasms.   Past Week   cetirizine (ZYRTEC) 10 MG tablet Take 10 mg by mouth daily.   09/18/2021   Cholecalciferol (VITAMIN D3) 50 MCG (2000 UT) TABS Take 1 tablet by mouth daily.   Past Week   Cyanocobalamin (VITAMIN DEFICIENCY SYSTEM-B12) 1000 MCG/ML KIT Inject 1,000 mcg as directed every 30 (thirty) days.   Past Month   DULoxetine (CYMBALTA) 60 MG capsule Take 1 capsule (60 mg total) by mouth 2 (two) times daily. (Patient taking differently: Take 120 mg by mouth 2 (two) times daily.) 30 capsule 3 Past Week   fentaNYL (DURAGESIC) 50 MCG/HR Place 1 patch onto the skin every 3 (three) days. 5 patch 0 Past Week   gabapentin (NEURONTIN) 600 MG tablet Take 0.5 tablets (300 mg total) by  mouth 3 (three) times daily. (Patient taking differently: Take 600 mg by mouth 3 (three) times daily.)   Past Week   insulin aspart protamine - aspart (NOVOLOG MIX 70/30 FLEXPEN) (70-30) 100 UNIT/ML FlexPen Inject 75 Units into the skin 3 (three) times daily. Inject 75 units in the morning,  noon, and bedtime.   Past Week   levETIRAcetam (KEPPRA) 750 MG tablet Take 750 mg by mouth 2 (two) times daily.   Past Week   levothyroxine (SYNTHROID) 137 MCG tablet Take 137 mcg by mouth daily.   Past Week   lidocaine (LIDODERM) 5 % Place 3 patches onto the skin See admin instructions. Place 3 patches daily to affected areas and replace every 24 hours  0 Past Week   NEXIUM 40 MG capsule Take 40 mg by mouth daily.   09/17/2021   nystatin (MYCOSTATIN/NYSTOP) powder Apply 1 application  topically 3 (three) times daily as needed (skin irritation).   [place]   omega-3 acid ethyl esters (LOVAZA) 1 g capsule Take 1 capsule by mouth 2 (two) times daily.   Past Week   Oxycodone HCl 10 MG TABS Take 1 tablet (10 mg total) by mouth every 6 (six) hours as needed (for pain). 1 tablet 0 09/17/2021   OZEMPIC, 0.25 OR 0.5 MG/DOSE, 2 MG/3ML SOPN Inject 0.25 mg into the skin once a week.   09/17/2021   phentermine (ADIPEX-P) 37.5 MG tablet Take 37.5 mg by mouth daily.   Past Week   promethazine (PHENERGAN) 25 MG tablet Take 25 mg by mouth every 6 (six) hours as needed for nausea or vomiting.   unknown   sitaGLIPtin (JANUVIA) 50 MG tablet Take 50 mg by mouth daily.   Past Week   SYMBICORT 160-4.5 MCG/ACT inhaler Inhale 2 puffs into the lungs 2 (two) times daily as needed for shortness of breath or wheezing.   Past Month   triamcinolone cream (KENALOG) 0.1 % Apply 1 application topically 2 (two) times daily as needed (skin rash).   unknown   acetaminophen (TYLENOL) 325 MG tablet Take 2 tablets (650 mg total) by mouth every 6 (six) hours as needed for mild pain (or Fever >/= 101). (Patient not taking: Reported on 09/18/2021)   Completed  Course   ARIPiprazole (ABILIFY) 5 MG tablet Take 5 mg by mouth daily.      Diclofenac Sodium 2 % SOLN Place 2-4 g onto the skin 4 (four) times daily as needed. (Patient not taking: Reported on 09/18/2021) 200 g 3 Completed Course   doxycycline (VIBRA-TABS) 100 MG tablet Take 1 tablet (100 mg total) by mouth 2 (two) times daily. (Patient not taking: Reported on 09/18/2021) 30 tablet 1 Completed Course   Scheduled:  acetaminophen  650 mg Oral Q6H   aspirin EC  81 mg Oral Daily   atorvastatin  20 mg Oral Daily   DULoxetine  60 mg Oral BID   fentaNYL  1 patch Transdermal Q72H   gabapentin  300 mg Oral QHS   heparin  5,000 Units Subcutaneous Q8H   insulin aspart  0-15 Units Subcutaneous TID WC   insulin glargine-yfgn  20 Units Subcutaneous Daily   levETIRAcetam  500 mg Oral Q12H   levothyroxine  137 mcg Oral Q0600   lidocaine  2 patch Transdermal Q24H   midodrine  5 mg Oral TID WC   [START ON 09/20/2021] pantoprazole  80 mg Oral Q1200   polyethylene glycol  17 g Oral Daily   Continuous:  lactated ringers 150 mL/hr at 09/19/21 1142    ROS:  As per HPI.    Blood pressure (!) 92/47, pulse 82, temperature 98.1 F (36.7 C), temperature source Oral, resp. rate 19, height 5' 6" (1.676 m), weight (!) 155.6 kg, SpO2 94 %.   General Examination:                                                                                                       Physical Exam  General: Supermorbid obesity HEENT-  Normocephalic. Small bruise along right forehead.  Lungs- Respirations unlabored Extremities- No edema  Neurological Examination Mental Status: Drowsy to somnolent due to recent medication administration. Oriented x 5. Speech becomes slurred just prior to nodding off. Can drift off mid-sentence. Awakens with tactile stimulation and voice after falling asleep. Speech is  fluent with intact comprehension and naming.  Cranial Nerves: II: Temporal visual fields intact with no extinction to DSS. PERRL.  III,IV, VI: Left ptosis. States this is from a remote occurrence of left sided Bell's palsy. EOMI without nystagmus.  V: Temp sensation decreased on the right.  VII: Left facial weakness most apparent when smiling/grimacing and puffing cheeks, which patient states is chronic due to prior Bell's palsy VIII: Hearing intact to conversation IX,X: No hypophonia XI: Symmetric shoulder shrug XII: Midline tongue extension Motor: BUE 5/5 without asymmetry. No pronator drift.  RLE: Motor assessment compromised by pain. Unable to lift at hip antigravity due to pain: 2/5 hip flexion. Knee extension also compromised by pain, with maximum strength elicited at 0-5/3 Knee flexion 3/5 in the context of pain. Chronic drop foot due to prior back surgery, unable to dorsiflex. Plantar flexion 4/5 LLE: Motor assessment compromised by pain, but less than on the right. HF 3/5, KE 4/5, KF 4/5, ADF 5/5, APF 5/5 Sensory: Temp and light touch intact throughout, bilaterally. No extinction to DSS.  Deep Tendon Reflexes: 1+ bilateral brachioradialis. Deferred patellae due to knee pain. 0 achilles bilaterally.  Plantars: Right: M[place]  Left: Downgoing Cerebellar: No ataxia with FNF bilaterally Gait: Deferred   Lab Results: Basic Metabolic Panel: Recent Labs  Lab 09/18/21 1356 09/19/21 0125 09/19/21 1359  NA 134* 134* 138  K 3.1* 2.8* 3.3*  CL 101 100 107  CO2 14* 18* 21*  GLUCOSE 358* 413* 356*  BUN 44* 43* 34*  CREATININE 6.47* 4.11* 1.99*  CALCIUM 9.0 8.5* 8.5*  MG  --  1.2*  --   PHOS  --  4.0  --     CBC: Recent Labs  Lab 09/18/21 1356 09/19/21 0125  WBC 18.4* 11.8*  NEUTROABS 12.6*  --   HGB 14.7 12.8  HCT 44.1 37.9  MCV 94.4 93.3  PLT 274 193    Cardiac Enzymes: Recent Labs  Lab 09/18/21 1700 09/19/21 0125  CKTOTAL 2,597* 1,660*    Lipid Panel: No  results for input(s): "CHOL", "TRIG", "HDL", "CHOLHDL", "VLDL", "LDLCALC" in the last 168 hours.  Imaging: US RENAL  Result Date: 09/18/2021 CLINICAL DATA:  Renal dysfunction EXAM: RENAL / URINARY TRACT ULTRASOUND COMPLETE COMPARISON:  CT done on 05/14/2021 FINDINGS: Right Kidney: Renal measurements: 10 x 5.1 x  4.8 cm = volume: 130.5 mL. Echogenicity within normal limits. No mass or hydronephrosis visualized. Left Kidney: Renal measurements: 11.5 x 5.2 x 4.8 cm = volume: 149.87 mL. Echogenicity within normal limits. No mass or hydronephrosis visualized. Bladder: Appears normal for degree of bladder distention. Other: None. IMPRESSION: There is no hydronephrosis. Electronically Signed   By: Elmer Picker M.D.   On: 09/18/2021 19:18   DG Lumbar Spine Complete  Result Date: 09/18/2021 CLINICAL DATA:  Fall with back EXAM: LUMBAR SPINE - COMPLETE 4+ VIEW COMPARISON:  MRI 12/18/2019 FINDINGS: IUD in the pelvis. Five non rib-bearing lumbar type vertebra. Trace anterolisthesis L5 on S1, trace retrolisthesis L3 on L4. Vertebral body heights are maintained. Chronic bilateral pars defect at L5. Multilevel degenerative change with marked disc space narrowing at L3-L4 and L4-L5. Mild degenerative changes elsewhere. Facet degenerative changes of the lower lumbar spine. IMPRESSION: 1. No acute osseous abnormality 2. Multilevel degenerative changes, worst at L3-L4 and L4-L5. 3. 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 Electronically Signed   By: Donavan Foil M.D.   On: 09/18/2021 15:17   DG Knee Complete 4 Views Right  Result Date: 09/18/2021 CLINICAL DATA:  Knee pain fall EXAM: RIGHT KNEE - COMPLETE 4+ VIEW COMPARISON:  08/18/2019 FINDINGS: No acute fracture or malalignment. Severe tricompartment arthritis of the knee with bone on bone appearance, subarticular sclerosis and cyst formation. Bulky patellofemoral spurring superior and inferior. No sizable knee effusion IMPRESSION:  1. No acute osseous abnormality 2. Severe tricompartment arthritis of the knee Electronically Signed   By: Donavan Foil M.D.   On: 09/18/2021 15:14   DG Knee Complete 4 Views Left  Result Date: 09/18/2021 CLINICAL DATA:  Pain after a fall. EXAM: LEFT KNEE - COMPLETE 4+ VIEW COMPARISON:  None Available. FINDINGS: Marked, significantly age advanced 3 compartment osteoarthritis, with joint space narrowing and osteophyte formation. No acute fracture or dislocation. No joint effusion. IMPRESSION: Marked 3 compartment osteoarthritis, without acute superimposed finding. Electronically Signed   By: Abigail Miyamoto M.D.   On: 09/18/2021 15:14    Assessment: 51 year old female with complex PMHx including seizures since childhood who presents after an episode of LOC at home resulting in a fall followed by 2 days of immobility on a hard linoleum surface in conjunction with dehydration resulting in rhabdomyolysis with AKI  - No clinical seizure activity seen on exam. She is sedated from recent medication administration.  - DDx for the LOC episode includes syncope and seizure.  - Bilateral knee pain is being managed with lidocaine patches. On 10 mg oxycodone po q6h PRN. Patient states that this is not sufficient to control her pain.   Recommendations: - Fentanyl patch 12 mcg/hr ordered for more steady pain control. Patient states she is on a fentanyl patch for pain at home. May need to increase dose if pain not well controlled.   - EEG - Continue her home dose Keppra at 750 mg po BID.  - Agree with obtaining a Keppra level - Syncope work up - Management of AKI and rhabdomyolysis per primary team. - Inpatient seizure precautions.  - Outpatient seizure precautions: Per [facility] statutes, patients with seizures are not allowed to drive until  they have been seizure-free for six months. Use caution when using heavy equipment or power tools. Avoid working on ladders or at heights. Take showers instead of  baths. Ensure the water temperature is not too high on the home water heater. Do  not go swimming alone. When caring for infants or small children, sit down when holding, feeding, or changing them to minimize risk of injury to the child in the event you have a seizure. Also, Maintain good sleep hygiene. Avoid alcohol.   Addendum: EEG: Continuous slow, generalized. This study is suggestive of moderate diffuse encephalopathy, nonspecific etiology. No seizures or epileptiform discharges were seen throughout the recording.  Electronically signed: Dr. Kerney Elbe 09/19/2021, 3:19 PM

## 2021-09-20 ENCOUNTER — Inpatient Hospital Stay (HOSPITAL_COMMUNITY): Payer: Medicare HMO

## 2021-09-20 DIAGNOSIS — N179 Acute kidney failure, unspecified: Secondary | ICD-10-CM | POA: Diagnosis not present

## 2021-09-20 DIAGNOSIS — I959 Hypotension, unspecified: Secondary | ICD-10-CM

## 2021-09-20 DIAGNOSIS — R55 Syncope and collapse: Secondary | ICD-10-CM | POA: Diagnosis not present

## 2021-09-20 LAB — HEMOGLOBIN A1C
Hgb A1c MFr Bld: 7.9 % — ABNORMAL HIGH (ref 4.8–5.6)
Mean Plasma Glucose: 180 mg/dL

## 2021-09-20 LAB — ECHOCARDIOGRAM COMPLETE
AR max vel: 2.77 cm2
AV Area VTI: 3.01 cm2
AV Area mean vel: 2.67 cm2
AV Mean grad: 5 mmHg
AV Peak grad: 9.5 mmHg
Ao pk vel: 1.54 m/s
Area-P 1/2: 4.35 cm2
Height: 66 in
S' Lateral: 3.2 cm
Weight: 5488 oz

## 2021-09-20 LAB — BASIC METABOLIC PANEL
Anion gap: 7 (ref 5–15)
BUN: 29 mg/dL — ABNORMAL HIGH (ref 6–20)
CO2: 21 mmol/L — ABNORMAL LOW (ref 22–32)
Calcium: 8.6 mg/dL — ABNORMAL LOW (ref 8.9–10.3)
Chloride: 111 mmol/L (ref 98–111)
Creatinine, Ser: 1.35 mg/dL — ABNORMAL HIGH (ref 0.44–1.00)
GFR, Estimated: 47 mL/min — ABNORMAL LOW (ref >60)
Glucose, Bld: 255 mg/dL — ABNORMAL HIGH (ref 70–99)
Potassium: 4.1 mmol/L (ref 3.5–5.1)
Sodium: 139 mmol/L (ref 135–145)

## 2021-09-20 LAB — GLUCOSE, CAPILLARY
Glucose-Capillary: 239 mg/dL — ABNORMAL HIGH (ref 70–99)
Glucose-Capillary: 277 mg/dL — ABNORMAL HIGH (ref 70–99)
Glucose-Capillary: 228 mg/dL — ABNORMAL HIGH (ref 70–99)
Glucose-Capillary: 171 mg/dL — ABNORMAL HIGH (ref 70–99)

## 2021-09-20 LAB — CK: Total CK: 621 U/L — ABNORMAL HIGH (ref 38–234)

## 2021-09-20 LAB — LEVETIRACETAM LEVEL: Levetiracetam Lvl: 5.6 ug/mL — ABNORMAL LOW (ref 10.0–40.0)

## 2021-09-20 LAB — MAGNESIUM: Magnesium: 1.4 mg/dL — ABNORMAL LOW (ref 1.7–2.4)

## 2021-09-20 MED ORDER — ADULT MULTIVITAMIN W/MINERALS CH
1.0000 | ORAL_TABLET | Freq: Every day | ORAL | Status: DC
  Administered 2021-09-20 – 2021-09-28 (×9): 1 via ORAL
  Filled 2021-09-20 (×9): qty 1

## 2021-09-20 MED ORDER — INSULIN ASPART 100 UNIT/ML IJ SOLN
0.0000 [IU] | Freq: Three times a day (TID) | INTRAMUSCULAR | Status: DC
  Administered 2021-09-20 – 2021-09-21 (×2): 7 [IU] via SUBCUTANEOUS
  Administered 2021-09-21: 11 [IU] via SUBCUTANEOUS
  Administered 2021-09-21 – 2021-09-22 (×2): 7 [IU] via SUBCUTANEOUS
  Administered 2021-09-22: 11 [IU] via SUBCUTANEOUS
  Administered 2021-09-22: 15 [IU] via SUBCUTANEOUS
  Administered 2021-09-23 (×3): 7 [IU] via SUBCUTANEOUS
  Administered 2021-09-24: 4 [IU] via SUBCUTANEOUS
  Administered 2021-09-24: 7 [IU] via SUBCUTANEOUS
  Administered 2021-09-25 (×3): 4 [IU] via SUBCUTANEOUS
  Administered 2021-09-26: 11 [IU] via SUBCUTANEOUS
  Administered 2021-09-26 (×2): 7 [IU] via SUBCUTANEOUS
  Administered 2021-09-27: 4 [IU] via SUBCUTANEOUS
  Administered 2021-09-27 (×2): 11 [IU] via SUBCUTANEOUS
  Administered 2021-09-28 (×3): 7 [IU] via SUBCUTANEOUS

## 2021-09-20 MED ORDER — INSULIN ASPART 100 UNIT/ML IJ SOLN
0.0000 [IU] | Freq: Every day | INTRAMUSCULAR | Status: DC
  Administered 2021-09-23: 3 [IU] via SUBCUTANEOUS
  Administered 2021-09-25: 2 [IU] via SUBCUTANEOUS
  Administered 2021-09-26: 5 [IU] via SUBCUTANEOUS

## 2021-09-20 MED ORDER — PERFLUTREN LIPID MICROSPHERE
1.0000 mL | INTRAVENOUS | Status: AC | PRN
  Administered 2021-09-20: 3 mL via INTRAVENOUS

## 2021-09-20 MED ORDER — FENTANYL 50 MCG/HR TD PT72
1.0000 | MEDICATED_PATCH | TRANSDERMAL | Status: DC
  Administered 2021-09-20 – 2021-09-26 (×3): 1 via TRANSDERMAL
  Filled 2021-09-20 (×3): qty 1

## 2021-09-20 MED ORDER — INSULIN ASPART PROT & ASPART (70-30 MIX) 100 UNIT/ML ~~LOC~~ SUSP
25.0000 [IU] | Freq: Two times a day (BID) | SUBCUTANEOUS | Status: DC
  Administered 2021-09-20 (×2): 25 [IU] via SUBCUTANEOUS
  Filled 2021-09-20: qty 10

## 2021-09-20 MED ORDER — MAGNESIUM SULFATE 2 GM/50ML IV SOLN
2.0000 g | Freq: Once | INTRAVENOUS | Status: AC
  Administered 2021-09-20: 2 g via INTRAVENOUS
  Filled 2021-09-20: qty 50

## 2021-09-20 NOTE — Progress Notes (Signed)
Initial Nutrition Assessment  DOCUMENTATION CODES:   Obesity unspecified  INTERVENTION:   Multivitamin w/ minerals daily Encourage good PO intake  NUTRITION DIAGNOSIS:   Inadequate oral intake related to decreased appetite as evidenced by per patient/family report.  GOAL:   Patient will meet greater than or equal to 90% of their needs  MONITOR:   PO intake, Supplement acceptance, Labs, Weight trends  REASON FOR ASSESSMENT:   Consult Assessment of nutrition requirement/status, Diet education  ASSESSMENT:   51 y.o. female presented to the ED with knee pain after a fall. PMH includes HTN, T2DM, chronic pain syndrome, and seizure disorders. Pt admitted with AKI.   Pt reports that she typically has 1 cup of yogurt for breakfast, an apple for lunch, and a small salad for dinner. Reports that she has been eating this way since about December time. Denies any supplement use at home. Pt reports that she has not ate anything today and that she is in so much pain she cannot eat.  Discussed the importance of including protein in each meal and snack. Pt states that she does not cook protein due to being in pain.  Reports that she has lost 145# in the last 6 months. Unable to provide UBW. Per EMR, pt has had a 6% weight loss in 8 months, this is not clinically significant for time frame. This RD does suspect that current weight is incorrect and is a stated weight, recommend obtaining a new weight.   Pt reports that she continues to take her insulin, but was not checking her levels as much.   Pt reporting that she is in a lot of pain and would like pain meds. This RD informed RN of pt request.  Discussed that RD would return at later time to provide education on DM education.   Medications reviewed and include: NovoLog, Miralax, Senokot Labs reviewed: BUN 29, Creatinine 1.35, Magnesium 1.4, Hgb A1c 7.9%, 24 hr CBG 215-335  NUTRITION - FOCUSED PHYSICAL EXAM:  Pt deferred due to  pain.  Diet Order:   Diet Order             Diet Carb Modified Fluid consistency: Thin; Room service appropriate? No  Diet effective now                   EDUCATION NEEDS:   Not appropriate for education at this time  Skin:  Skin Assessment: Reviewed RN Assessment  Last BM:  6/27  Height:   Ht Readings from Last 1 Encounters:  09/18/21 '5\' 6"'$  (1.676 m)    Weight:   Wt Readings from Last 1 Encounters:  09/18/21 (!) 155.6 kg    Ideal Body Weight:  59.1 kg  BMI:  Body mass index is 55.36 kg/m.  Estimated Nutritional Needs:   Kcal:  2200-2400  Protein:  110-125 grams  Fluid:  >/= 2 L    Hermina Barters RD, LDN Clinical Dietitian See [facility] for contact information.

## 2021-09-20 NOTE — Progress Notes (Signed)
  Echocardiogram 2D Echocardiogram has been performed.  Joette Catching 09/20/2021, 2:19 PM

## 2021-09-20 NOTE — Progress Notes (Signed)
Nephrology Follow-Up Consult note   Assessment/Recommendations: [name] is a/an 51 y.o. female with a past medical history significant for DM2, HTN, obesity, chronic pain, multiple AKIs, admitted for dizziness and falls.       Non-Oliguric AKI: Likely prerenal for the most part.  Creatinine improving significantly from 6.5 now down to 1.3 -Stop IV fluids -Continue midodrine for now but can stop as blood pressure comes up -Continue to monitor daily Cr, Dose meds for GFR -Monitor Daily I/Os, Daily weight  -Maintain MAP>65 for optimal renal perfusion.  -Avoid nephrotoxic medications including NSAIDs -Use synthetic opioids (Fentanyl/Dilaudid) if needed -Foley catheter if needed.  Continue PVRs  Given the patient is improved we will sign off at this time  Hypotension: Unclear cause.  Hold home blood pressure medications.  Continue midodrine.  Can stop once blood pressure has improved  Hypokalemia/hypomagnesemia: Hypokalemia resolved.  Hypomagnesemia continued.  Likely losing some and GI tract related to PPI.  Supplementation per primary team  Metabolic acidosis: Significantly improved.  No changes  Knee pain: Limit opioids as able.  Uncontrolled Diabetes Mellitus Type 2 with Hyperglycemia: Management per primary team.   Recommendations conveyed to primary service.    [facility] 09/20/2021 11:30 AM  ___________________________________________________________  CC: AKI  Interval History/Subjective: Minimal pain at this point.  Denies any issues.  Did require intermittent catheterization  Medications:  Current Facility-Administered Medications  Medication Dose Route Frequency Provider Last Rate Last Admin   aspirin EC tablet 81 mg  81 mg Oral Daily Precious Gilding, DO   81 mg at 09/20/21 0911   atorvastatin (LIPITOR) tablet 20 mg  20 mg Oral Daily Precious Gilding, DO   20 mg at 09/20/21 0911   DULoxetine (CYMBALTA) DR capsule 60 mg  60 mg  Oral BID Precious Gilding, DO   60 mg at 09/20/21 0911   fentaNYL (DURAGESIC) 12 MCG/HR 1 patch  1 patch Transdermal Q72H Kerney Elbe, MD   1 patch at 09/19/21 1722   gabapentin (NEURONTIN) capsule 300 mg  300 mg Oral QHS Alen Bleacher, MD   300 mg at 09/19/21 2121   heparin injection 5,000 Units  5,000 Units Subcutaneous Deon Pilling, MD   5,000 Units at 09/20/21 0550   insulin aspart protamine- aspart (NOVOLOG MIX 70/30) injection 25 Units  25 Units Subcutaneous BID WC Precious Gilding, DO   25 Units at 09/20/21 1047   levETIRAcetam (KEPPRA) tablet 750 mg  750 mg Oral Q12H Kerney Elbe, MD   750 mg at 09/20/21 0911   levothyroxine (SYNTHROID) tablet 137 mcg  137 mcg Oral Q0600 Rosezetta Schlatter, MD   137 mcg at 09/20/21 0548   lidocaine (LIDODERM) 5 % 2 patch  2 patch Transdermal Q24H Sharion Settler, DO   2 patch at 09/20/21 0552   midodrine (PROAMATINE) tablet 5 mg  5 mg Oral TID WC Reesa Chew, MD   5 mg at 09/20/21 9476   oxyCODONE (Oxy IR/ROXICODONE) immediate release tablet 10 mg  10 mg Oral Q6H PRN Rosezetta Schlatter, MD   10 mg at 09/20/21 0911   pantoprazole (PROTONIX) EC tablet 80 mg  80 mg Oral Q1200 Precious Gilding, DO       polyethylene glycol (MIRALAX / GLYCOLAX) packet 17 g  17 g Oral Daily Sharion Settler, DO   17 g at 09/18/21 2236   senna-docusate (Senokot-S) tablet 1 tablet  1 tablet Oral BID Sharion Settler, DO  Review of Systems: 10 systems reviewed and negative except per interval history/subjective  Physical Exam: Vitals:   09/20/21 0400 09/20/21 0759  BP:  100/66  Pulse: 84 77  Resp: 17 16  Temp: 98 F (36.7 C) 97.8 F (36.6 C)  SpO2: 97% 97%   No intake/output data recorded.  Intake/Output Summary (Last 24 hours) at 09/20/2021 1130 Last data filed at 09/20/2021 0219 Gross per 24 hour  Intake 1105.92 ml  Output 1700 ml  Net -594.08 ml   Constitutional: No distress, lying in bed ENMT: ears and nose without scars or lesions, MMM CV:  normal rate, no edema Respiratory: Bilateral chest rise, normal work of breathing Gastrointestinal: soft, non-tender, no palpable masses or hernias Skin: Some bruising on the knees, otherwise no visible lesions or rashes Psych: alert, judgement/insight appropriate, appropriate mood and affect   Test Results I personally reviewed new and old clinical labs and radiology tests Lab Results  Component Value Date   NA 139 09/20/2021   K 4.1 09/20/2021   CL 111 09/20/2021   CO2 21 (L) 09/20/2021   BUN 29 (H) 09/20/2021   CREATININE 1.35 (H) 09/20/2021   CALCIUM 8.6 (L) 09/20/2021   ALBUMIN 3.3 (L) 09/19/2021   PHOS 4.0 09/19/2021    CBC Recent Labs  Lab 09/18/21 1356 09/19/21 0125  WBC 18.4* 11.8*  NEUTROABS 12.6*  --   HGB 14.7 12.8  HCT 44.1 37.9  MCV 94.4 93.3  PLT 274 193

## 2021-09-20 NOTE — Progress Notes (Signed)
Inpatient Rehab Admissions Coordinator:   Attempted x2 to speak to pt via phone to discuss CIR.  Visiting on first attempt and getting ECHO second attempt.  Will try to f/u at bedside tomorrow.    Shann Medal, PT, DPT Admissions Coordinator [phone] 09/20/21  1:46 PM

## 2021-09-20 NOTE — Assessment & Plan Note (Addendum)
BP has been within acceptable limits, though orthostatics have occasionally been abnormal. Suspect cause of hypotension and falls 2/2 medication side effects and decreased PO intake with dieting. SNF offers given to patient, and she is coordinating with TOC. -Midodrine 10 mg 3 times daily with meals -Follow-up aldosterone/renin activity ratio -F/u TOC for placement, though she may just go home -Continue oxy, cymbalta to 60 mg daily, gabapentin to QHS, and insulin regimen to 70/30 40u to reduce hypotension/hypoglycemia risk and further falls on d/c

## 2021-09-20 NOTE — Assessment & Plan Note (Addendum)
Was repleted w/ 2 g mag IV yesterday. Mag low again this morning at 1.3 Magnesium 2 g IV twice daily today

## 2021-09-20 NOTE — Progress Notes (Signed)
Daily Progress Note Intern Pager: [phone]  Patient name: [name] Medical record number: [id number] Date of birth: 1970-08-04 Age: 51 y.o. Gender: female  Primary Care Provider: Pecolia Ades, NP Consultants: Nephrology, neurology Code Status: Full  Pt Overview and Major Events to Date:  09/18/2021-admitted 09/20/2021-AKI resolved  Assessment and Plan: [name] is a 51 y.o. female presenting with AKI and rhabdomyolysis after loss of consciousness and being immobile for 2 days without p.o. intake.   * AKI (acute kidney injury) ([place]) Patient has continued to receive maintenance IV fluids since admission with resolve meant of AKI, creatinine today of 1.35, down from 6.47 on admission (Baseline ~1.7).  AKI likely d/t severe dehydration after no p.o. intake for 2 days. -Nephrology signed off -Follow-up UA with microscopy -Avoid nephrotoxic agents -mIVF d/c'd     Loss of consciousness ([name]) Patient reports having 4 episodes of loss of consciousness in the last 2 weeks which patient attributes to possible seizure activity.  Orthostatic vitals were normal.  Consider decreased p.o. intake with large amounts of insulin, cardiac etiology, or insomnia as cause of LOC. -Follow up Keppra level -Seizure precautions -Neurochecks -Follow-up echocardiogram  Poorly controlled type 2 diabetes mellitus ([place]) Home medication include Norvolog mix 70/30 75unit TID and recently started weekly Ozempic.  A1c yesterday of 7.9, was 12.4 in February.  This drastic drop is likely due to to large amounts of insulin daily while she was only eating very small amounts of food. Pt would likely benefit from RD consult. Glucose 225 this morning.  She received 20 units Semglee yesterday morning along with 28 units short acting. -POCT CBG Q4H. -Start 70/30 insulin 25 units twice daily with meals -We will consult RD today  Urinary retention Yesterday patient was able to urinate on her  own however overnight had >500 cc retained urine on bladder scan and I&O yielded >800 cc.  Of note patient required Foley placement during last hospitalization in February. -Bladder scans every 8 hours, will get post void scan   Seizure disorder ([name]) Home meds include Keppra 750 mg BID. Per pt, thinks she has had several seizures over past two weeks when she was previously stable.  Yesterday neurology was consulted, recommended continued her home dose of Keppra.  Ordered EEG which showed moderate diffuse encephalopathy with nonspecific etiology, did not show seizures or epileptiform discharges. -Continue home keppra -Neurology consulted, appreciate recs  Hypokalemia Potassium stable at 4.1 this morning after aggressive repletion yesterday. -am BMP   Hypomagnesemia Mag low this morning at 1.4, repleted with 2 g IV magnesium  Chronic pain Patient with severe arthritis of the lower back and bilateral knee.  Home medication include chronic oxycodone 10 mg every 6 hours as needed, fentanyl patch every Q3D and lidocaine patch. She endorses having lowerback pain and bilateral knee pain which is more severe on the left knee. Imagining show significant osteoarthritis at the L3- L4 & L4-L5 of lumbar spine and bilateral knee. -Lidocain patch -Gabapentin '300mg'$  QHS -PT/OT eval and treat -Tylenol 650 mg every 6 hours -Continue home oxy as needed -Fentanyl patch 50 mcg every 72 hours  Hypotension BP has consistently been low, as low as 59/50 yesterday early morning.  Patient was started on midodrine per nephrology yesterday with improvement.  BPs over the last 24 hours ranged from a 76-114/38-73.  -Midodrine 5 mg 3 times daily with meals -Continue to monitor blood pressure  Rhabdomyolysis CK improved from 2597> 1660>621 this morning after  receiving maintenance IV fluids since admission.  We will stop trending CK -Follow-up UA with microscopy -Continue maintenance IV fluid   FEN/GI: Carb  modified PPx: Heparin Dispo: Home pending continued medical management  Subjective:  Patient states she is still having pain in her knees and legs due to OA and from her fall.  Today we discussed what patient has been eating on a regular basis prior to this fall.  She has been trying to lose weight for the past 7 months, has lost a significant amount.  Has been eating a typical diet of yogurt in the mornings, only 1 apple and a handful of grapes for lunch and a very small salad with lettuce cheese bacon bits ham and crackers with ranch dressing for dinner.  States sometimes she will go 2 days without eating.  On the day she does not eat she says she does not take her insulin. We also discussed why patient did not call 911 when she was on the floor for 2 days and was immobile.  She states this was because her door was locked her windows were locked, she did not want them to discharge her home attempted to help her.  This is why she waited until her brother answered his phone who had a key and could come and help her.  Objective: Temp:  [97.8 F (36.6 C)-98.4 F (36.9 C)] 97.9 F (36.6 C) (06/29 1143) Pulse Rate:  [70-84] 74 (06/29 1143) Resp:  [14-19] 19 (06/29 1143) BP: (76-114)/(48-73) 98/59 (06/29 1143) SpO2:  [91 %-97 %] 94 % (06/29 1143) Physical Exam: General: 51 year old morbidly obese female, NAD Cardiovascular: RRR, normal S1/S2 Respiratory: Breathing comfortably on room air Abdomen: Bowel sounds present, soft, nontender, nondistended Extremities: 5/5 muscle strengt BUEs, 2/5 muscle strength in left lower extremity, lower and right extremity due to foot drop Neuro: Cranial nerves II through XII intact with exception of symptoms of Bell's palsy, sensation intact, finger-nose-finger test normal  Laboratory: Most recent CBC Lab Results  Component Value Date   WBC 11.8 (H) 09/19/2021   HGB 12.8 09/19/2021   HCT 37.9 09/19/2021   MCV 93.3 09/19/2021   PLT 193 09/19/2021   Most  recent BMP    Latest Ref Rng & Units 09/20/2021    2:37 AM  BMP  Glucose 70 - 99 mg/dL 255   BUN 6 - 20 mg/dL 29   Creatinine 0.44 - 1.00 mg/dL 1.35   Sodium 135 - 145 mmol/L 139   Potassium 3.5 - 5.1 mmol/L 4.1   Chloride 98 - 111 mmol/L 111   CO2 22 - 32 mmol/L 21   Calcium 8.9 - 10.3 mg/dL 8.6      Precious Gilding, DO 09/20/2021, 12:07 PM  PGY-1, [place] Intern pager: [phone], text pages welcome Secure chat group [place]

## 2021-09-20 NOTE — Assessment & Plan Note (Addendum)
Yesterday patient was able to urinate on her own all day yesterday. -Bladder scans every 8 hours if not voiding

## 2021-09-20 NOTE — Inpatient Diabetes Management (Signed)
Inpatient Diabetes Program Recommendations  AACE/ADA: New Consensus Statement on Inpatient Glycemic Control (2015)  Target Ranges:  Prepandial:   less than 140 mg/dL      Peak postprandial:   less than 180 mg/dL (1-2 hours)      Critically ill patients:  140 - 180 mg/dL   Lab Results  Component Value Date   GLUCAP 239 (H) 09/20/2021   HGBA1C 7.9 (H) 09/19/2021    Review of Glycemic Control  Diabetes history: DM 2 Outpatient Diabetes medications: Lantus 75 units tid (verified by pt on 6/28) Januvia 50 mg Daily, Ozempic 0.25 weekly Current orders for Inpatient glycemic control:  Insulin orders changed 70/30 25 units bid  A1c 12.4% on 05/14/21 Pt sees Dr. Chalmers [facility] for DM control  -   Add Novolog Correction scale 0-15 units tid + hs in addition to 70/30  Thanks,  Tama Headings RN, MSN, BC-ADM Inpatient Diabetes Coordinator Team Pager [phone] (8a-5p)

## 2021-09-20 NOTE — Progress Notes (Signed)
FMTS Brief Progress Note  S: Patient is awake and laying in bed.  She is not feeling too great because she is fatigued and has low appetite.  Beyond that patient is unable to expand on any additional symptoms.  And her back pain is improved but still endorses significant pain in her knees.  Patient denies any chest pain, shortness of breath, lightheadedness or change in vision.  Reports voiding on her own about 4 hours ago.   O: BP 95/60 (BP Location: Left Arm)   Pulse 77   Temp 97.8 F (36.6 C) (Oral)   Resp 18   Ht '5\' 6"'$  (1.676 m)   Wt (!) 155.6 kg   SpO2 97%   BMI 55.36 kg/m    General: Awake, well appearing, NAD CV: RRR, no murmurs Pulm: CTAB, good WOB on RA, no crackles or wheezing Ext: No BLE edema, limited motion on LE due to pain on the knees and foot drop on the right foot which patient said is a chronic condition prior to admission   A/P: #Bilateral Knee pain Patient continue to have bilateral knee pain with limited motion. Discussed need to move both knees and she would knee physical therapy and she's agreeable to plan.  -Continue daily PT  -Lidocaine patch PRN  - Orders reviewed. Labs for AM ordered, which was adjusted as needed.    Alen Bleacher, MD 09/20/2021, 10:18 PM PGY-1, [place] Night Resident  Please page [phone] with questions.

## 2021-09-21 DIAGNOSIS — F419 Anxiety disorder, unspecified: Secondary | ICD-10-CM

## 2021-09-21 DIAGNOSIS — N179 Acute kidney failure, unspecified: Secondary | ICD-10-CM | POA: Diagnosis not present

## 2021-09-21 LAB — GLUCOSE, CAPILLARY
Glucose-Capillary: 210 mg/dL — ABNORMAL HIGH (ref 70–99)
Glucose-Capillary: 269 mg/dL — ABNORMAL HIGH (ref 70–99)
Glucose-Capillary: 222 mg/dL — ABNORMAL HIGH (ref 70–99)
Glucose-Capillary: 204 mg/dL — ABNORMAL HIGH (ref 70–99)
Glucose-Capillary: 177 mg/dL — ABNORMAL HIGH (ref 70–99)

## 2021-09-21 LAB — BASIC METABOLIC PANEL
Anion gap: 8 (ref 5–15)
BUN: 17 mg/dL (ref 6–20)
CO2: 24 mmol/L (ref 22–32)
Calcium: 8.8 mg/dL — ABNORMAL LOW (ref 8.9–10.3)
Chloride: 108 mmol/L (ref 98–111)
Creatinine, Ser: 0.96 mg/dL (ref 0.44–1.00)
GFR, Estimated: >60 mL/min (ref >60)
Glucose, Bld: 238 mg/dL — ABNORMAL HIGH (ref 70–99)
Potassium: 3.7 mmol/L (ref 3.5–5.1)
Sodium: 140 mmol/L (ref 135–145)

## 2021-09-21 LAB — MAGNESIUM: Magnesium: 1.3 mg/dL — ABNORMAL LOW (ref 1.7–2.4)

## 2021-09-21 LAB — CORTISOL: Cortisol, Plasma: 3 ug/dL

## 2021-09-21 MED ORDER — INSULIN ASPART PROT & ASPART (70-30 MIX) 100 UNIT/ML ~~LOC~~ SUSP
28.0000 [IU] | Freq: Two times a day (BID) | SUBCUTANEOUS | Status: DC
Start: 2021-09-21 — End: 2021-09-22
  Administered 2021-09-21 (×2): 28 [IU] via SUBCUTANEOUS
  Filled 2021-09-21: qty 10

## 2021-09-21 MED ORDER — COSYNTROPIN 0.25 MG IJ SOLR: 0.2500 mg | Freq: Once | INTRAMUSCULAR | Status: DC

## 2021-09-21 MED ORDER — COSYNTROPIN 0.25 MG IJ SOLR
0.2500 mg | Freq: Once | INTRAMUSCULAR | Status: AC
  Administered 2021-09-22: 0.25 mg via INTRAVENOUS
  Filled 2021-09-21: qty 0.25

## 2021-09-21 MED ORDER — MAGNESIUM SULFATE 2 GM/50ML IV SOLN
2.0000 g | Freq: Once | INTRAVENOUS | Status: AC
  Administered 2021-09-21: 2 g via INTRAVENOUS
  Filled 2021-09-21: qty 50

## 2021-09-21 MED ORDER — MAGNESIUM SULFATE 2 GM/50ML IV SOLN
2.0000 g | Freq: Once | INTRAVENOUS | Status: AC
Start: 2021-09-21 — End: 2021-09-21
  Administered 2021-09-21: 2 g via INTRAVENOUS
  Filled 2021-09-21: qty 50

## 2021-09-21 NOTE — Assessment & Plan Note (Addendum)
Well-controlled at this time - Continue Cymbalta '60mg'$  BID

## 2021-09-21 NOTE — Progress Notes (Signed)
Inpatient Rehab Coordinator Note:  I met with patient at bedside to discuss CIR recommendations and goals/expectations of CIR stay.  We reviewed 3 hrs/day of therapy, physician follow up, and average length of stay 2 weeks (dependent upon progress) with goals of modified independent.  Pt has limited support available to her at discharge, but does have someone who can check on her a few times a week.  Mostly limited by BP and knee pain at this point.  We discussed need for prior authorization from [facility] to proceed and I will start this request today.  Will continue to follow.   Shann Medal, PT, DPT Admissions Coordinator [phone] 09/21/21  2:31 PM
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
## 2021-09-21 NOTE — Progress Notes (Signed)
Physical Therapy Treatment Patient Details Name: [name] MRN: [id number] DOB: Sep 14, 1970 Today's Date: 09/21/2021   History of Present Illness 51 y/o  F brought in 09/18/21 for B/L knee pain s/p fall at home following a seizure episode. Reports 4 syncopal episodes in past 2 weeks (?seizures). On the floor for two days. +AKI; dehydration; rhabdomyolysis; uncontrolled DM; xray knee shows severe arthritis. PMH- morbid obesity, DM, HTN, chronic pain syndrome, seizure disorder.    PT Comments    Pt received in supine, agreeable to therapy session after RN assisted pt to don knee-high TED hose and premedication. Pt with excellent tolerance for gait/transfer training, continuing to require up to [place] for functional mobility tasks using RW. Pt with improved activity and pain tolerance this session, of note BP and pain score improved after completion of mobility tasks in room. Anticipate pt would greatly benefit from short term high-intensity post-acute rehab, she reports 6/10 modified RPE at end of session and agreeable to participate in OT session later in day. Pt modI at baseline and drives/lives alone and remains well below functional baseline. Pt up in chair with alarm on for safety at end of session. Pt continues to benefit from PT services to progress toward functional mobility goals.    Recommendations for follow up therapy are one component of a multi-disciplinary discharge planning process, led by the attending physician.  Recommendations may be updated based on patient status, additional functional criteria and insurance authorization.  Follow Up Recommendations  Acute inpatient rehab (3hours/day)     Assistance Recommended at Discharge Intermittent Supervision/Assistance  Patient can return home with the following A lot of help with walking and/or transfers;Assistance with cooking/housework;Assist for transportation;Help with stairs or ramp for entrance;A lot of help with  bathing/dressing/bathroom   Equipment Recommendations  None recommended by PT    Recommendations for Other Services Rehab consult     Precautions / Restrictions Precautions Precautions: Fall;Other (comment) Precaution Comments: seizure Required Braces or Orthoses: Other Brace Other Brace: uses R AFO when walking; pt to have someone bring it in and her shoes Restrictions Weight Bearing Restrictions: No     Mobility  Bed Mobility Overal bed mobility: Needs Assistance Bed Mobility: Supine to Sit     Supine to sit: Min assist     General bed mobility comments: cues for partial rolling to reach cross-body for rail, minA to advance hips with bed pad and to raise trunk, pt with good initiation but slow pace due to pain    Transfers Overall transfer level: Needs assistance Equipment used: Rolling walker (2 wheels) Transfers: Sit to/from Stand Sit to Stand: Min assist, From elevated surface, Mod assist           General transfer comment: from EOB>RW>BSC, then BSC>RW>recliner; modA initially from EOB then minA from [facility]    Ambulation/Gait Ambulation/Gait assistance: Min assist Gait Distance (Feet): 25 Feet (46f, seated break on BSC, then 129f Assistive device: Rolling walker (2 wheels) Gait Pattern/deviations: Decreased stride length, Decreased dorsiflexion - right, Knee flexed in stance - left, Knee flexed in stance - right, Trunk flexed       General Gait Details: heavy reliance on RW, RW adjusted lower during seated break with improved stability; pt reports R foot drop at baseline and AFO at home; mod cues for posture/technique for reduced pain and activity pacing       Balance Overall balance assessment: Needs assistance Sitting-balance support: No upper extremity supported, Feet supported Sitting balance-Leahy Scale: Fair  Standing balance support: Bilateral upper extremity supported, Reliant on assistive device for balance Standing balance-Leahy Scale:  Poor Standing balance comment: fair with RW, min guard for static standing during BP check with R arm resting down, min guard to minA for dynamic standing tasks with RW                            Cognition Arousal/Alertness: Awake/alert Behavior During Therapy: WFL for tasks assessed/performed Overall Cognitive Status: Within Functional Limits for tasks assessed                                 General Comments: Anxious re: mobility due to B knee pain but agreeable with encouragement; good following of 1 and 2-step commands, needs increased time due to pain.        Exercises Other Exercises Other Exercises: R heel cord stretch using sheet w/cues for 30 sec-60sec hold, encouraged pt to perform TID Other Exercises: BUE AROM: chest press x10-15 reps supine/seated (for improved hemodynamics with position changes)    General Comments General comments (skin integrity, edema, etc.): BP soft but improved with positional changes, BP 83/55 supine HR 84 then BP 94/65 HR 100 standing. Pt needs assist for peri-care after toileting while standing at RW.      Pertinent Vitals/Pain Pain Assessment Pain Assessment: Faces Pain Score: 8  Faces Pain Scale: Hurts even more Pain Location: R>L knees Pain Descriptors / Indicators: Burning, Shooting, Aching Pain Intervention(s): Monitored during session, Premedicated before session, Other (comment) (B lidocaine patches on over ea patella during session; pt reports pain improves to 6/10 post-mobility)           PT Goals (current goals can now be found in the care plan section) Acute Rehab PT Goals PT Goal Formulation: With patient Time For Goal Achievement: 10/03/21 Progress towards PT goals: Progressing toward goals    Frequency    Min 3X/week      PT Plan Current plan remains appropriate       AM-PAC PT "6 Clicks" Mobility   Outcome Measure  Help needed turning from your back to your side while in a flat bed  without using bedrails?: A Little Help needed moving from lying on your back to sitting on the side of a flat bed without using bedrails?: A Little Help needed moving to and from a bed to a chair (including a wheelchair)?: A Lot (mod cues) Help needed standing up from a chair using your arms (e.g., wheelchair or bedside chair)?: A Lot Help needed to walk in hospital room?: A Lot (mod cues) Help needed climbing 3-5 steps with a railing? : Total 6 Click Score: 13    End of Session Equipment Utilized During Treatment: Gait belt Activity Tolerance: Patient tolerated treatment well Patient left: in chair;with call bell/phone within reach;with chair alarm set Nurse Communication: Mobility status;Other (comment) (BP and pain improved with activity) PT Visit Diagnosis: Other abnormalities of gait and mobility (R26.89);Pain Pain - Right/Left: Right (bilat R worse than L) Pain - part of body: Knee     Time: 1126-1215 PT Time Calculation (min) (ACUTE ONLY): 49 min  Charges:  $Gait Training: 23-37 mins $Therapeutic Activity: 8-22 mins                     Hanah Moultry P., PTA Acute Rehabilitation Services Secure Chat Preferred 9a-5:30pm Office: [phone]  Kara Pacer Jerald Hennington 09/21/2021, 12:44 PM

## 2021-09-21 NOTE — Care Management Important Message (Signed)
Important Message  Patient Details  Name: [name] MRN: [id number] Date of Birth: [birth year]   Medicare Important Message Given:  Yes     Orbie Pyo 09/21/2021, 4:24 PM

## 2021-09-21 NOTE — Progress Notes (Signed)
FMTS Brief Note Reviewed patient's vitals, recent notes.  Vitals:   09/21/21 1623 09/21/21 2045  BP: (!) 85/55 107/62  Pulse: 95 71  Resp: 20 18  Temp: 97.9 F (36.6 C) 97.9 F (36.6 C)  SpO2: 95% 92%   At this time, no change in plan from day progress note.  Sharion Settler, DO Page [phone] with questions about this patient.

## 2021-09-22 DIAGNOSIS — N179 Acute kidney failure, unspecified: Secondary | ICD-10-CM | POA: Diagnosis not present

## 2021-09-22 LAB — BASIC METABOLIC PANEL
Anion gap: 8 (ref 5–15)
BUN: 16 mg/dL (ref 6–20)
CO2: 26 mmol/L (ref 22–32)
Calcium: 8.8 mg/dL — ABNORMAL LOW (ref 8.9–10.3)
Chloride: 104 mmol/L (ref 98–111)
Creatinine, Ser: 0.84 mg/dL (ref 0.44–1.00)
GFR, Estimated: >60 mL/min (ref >60)
Glucose, Bld: 298 mg/dL — ABNORMAL HIGH (ref 70–99)
Potassium: 3.9 mmol/L (ref 3.5–5.1)
Sodium: 138 mmol/L (ref 135–145)

## 2021-09-22 LAB — GLUCOSE, CAPILLARY
Glucose-Capillary: 294 mg/dL — ABNORMAL HIGH (ref 70–99)
Glucose-Capillary: 342 mg/dL — ABNORMAL HIGH (ref 70–99)
Glucose-Capillary: 233 mg/dL — ABNORMAL HIGH (ref 70–99)
Glucose-Capillary: 117 mg/dL — ABNORMAL HIGH (ref 70–99)

## 2021-09-22 LAB — MAGNESIUM: Magnesium: 1.7 mg/dL (ref 1.7–2.4)

## 2021-09-22 MED ORDER — INSULIN ASPART PROT & ASPART (70-30 MIX) 100 UNIT/ML ~~LOC~~ SUSP
32.0000 [IU] | Freq: Two times a day (BID) | SUBCUTANEOUS | Status: DC
Start: 2021-09-22 — End: 2021-09-28
  Administered 2021-09-22 – 2021-09-27 (×12): 32 [IU] via SUBCUTANEOUS
  Filled 2021-09-22: qty 10

## 2021-09-22 MED ORDER — ACETAMINOPHEN 325 MG PO TABS
650.0000 mg | ORAL_TABLET | Freq: Four times a day (QID) | ORAL | Status: DC
  Administered 2021-09-22 – 2021-09-28 (×25): 650 mg via ORAL
  Filled 2021-09-22 (×28): qty 2

## 2021-09-22 MED ORDER — INSULIN ASPART PROT & ASPART (70-30 MIX) 100 UNIT/ML ~~LOC~~ SUSP
30.0000 [IU] | Freq: Two times a day (BID) | SUBCUTANEOUS | Status: DC
Start: 2021-09-22 — End: 2021-09-22

## 2021-09-22 MED ORDER — COSYNTROPIN 0.25 MG IJ SOLR
0.2500 mg | Freq: Once | INTRAMUSCULAR | Status: AC
  Administered 2021-09-23: 0.25 mg via INTRAVENOUS
  Filled 2021-09-22: qty 0.25

## 2021-09-22 MED ORDER — ENOXAPARIN SODIUM 80 MG/0.8ML IJ SOSY
80.0000 mg | PREFILLED_SYRINGE | INTRAMUSCULAR | Status: DC
  Administered 2021-09-22 – 2021-09-23 (×2): 80 mg via SUBCUTANEOUS
  Filled 2021-09-22 (×2): qty 0.8

## 2021-09-22 MED ORDER — ENOXAPARIN SODIUM 80 MG/0.8ML IJ SOSY: 80.0000 mg | PREFILLED_SYRINGE | INTRAMUSCULAR | Status: DC

## 2021-09-22 MED ORDER — MAGNESIUM SULFATE 2 GM/50ML IV SOLN
2.0000 g | Freq: Once | INTRAVENOUS | Status: AC
  Administered 2021-09-22: 2 g via INTRAVENOUS
  Filled 2021-09-22: qty 50

## 2021-09-22 NOTE — Progress Notes (Signed)
Physical Therapy Treatment Patient Details Name: [name] MRN: [id number] DOB: 12-27-1970 Today's Date: 09/22/2021   History of Present Illness 51 y/o  F brought in 09/18/21 for B/L knee pain s/p fall at home following a seizure episode. Reports 4 syncopal episodes in past 2 weeks (?seizures). On the floor for two days. +AKI; dehydration; rhabdomyolysis; uncontrolled DM; xray knee shows severe arthritis. PMH- morbid obesity, DM, HTN, chronic pain syndrome, seizure disorder.    PT Comments    Patient progressing towards physical therapy goals. Patient with improved activity tolerance and able to progress ambulation distance. Patient complains of bilateral knee pain and soreness during session. Able to perform repeated sit to stand x 5 from recliner with increased time. Requires modA to stand from low surfaces. Encouraged patient to mobilize with nursing staff to bathroom to improve mobility and activity tolerance. Continue to recommend acute inpatient rehab (AIR) for post-acute therapy needs.     Recommendations for follow up therapy are one component of a multi-disciplinary discharge planning process, led by the attending physician.  Recommendations may be updated based on patient status, additional functional criteria and insurance authorization.  Follow Up Recommendations  Acute inpatient rehab (3hours/day)     Assistance Recommended at Discharge Intermittent Supervision/Assistance  Patient can return home with the following A lot of help with walking and/or transfers;Assistance with cooking/housework;Assist for transportation;Help with stairs or ramp for entrance;A lot of help with bathing/dressing/bathroom   Equipment Recommendations  None recommended by PT    Recommendations for Other Services       Precautions / Restrictions Precautions Precautions: Fall;Other (comment) Precaution Comments: seizure Required Braces or Orthoses: Other Brace Other Brace: uses R AFO when  walking; pt to have someone bring it in and her shoes Restrictions Weight Bearing Restrictions: No     Mobility  Bed Mobility               General bed mobility comments: in recliner on arrival    Transfers Overall transfer level: Needs assistance Equipment used: Rolling Smantha Boakye (2 wheels) Transfers: Sit to/from Stand Sit to Stand: Min guard, From elevated surface, Mod assist           General transfer comment: min guard from recliner surface with cues for hand placement. ModA to stand from low toilet in bathroom    Ambulation/Gait Ambulation/Gait assistance: Min assist Gait Distance (Feet): 75 Feet (+10 +10) Assistive device: Rolling Cortney Mckinney (2 wheels) Gait Pattern/deviations: Decreased stride length, Decreased dorsiflexion - right, Knee flexed in stance - left, Knee flexed in stance - right, Trunk flexed Gait velocity: decreased     General Gait Details: heavy use of RW. R foot drop at baseline demoing steppage gait pattern. Cues for posture and RW proximity   Stairs             Wheelchair Mobility    Modified Rankin (Stroke Patients Only)       Balance Overall balance assessment: Needs assistance Sitting-balance support: No upper extremity supported, Feet supported Sitting balance-Leahy Scale: Fair     Standing balance support: Bilateral upper extremity supported, Reliant on assistive device for balance Standing balance-Leahy Scale: Poor                              Cognition Arousal/Alertness: Awake/alert Behavior During Therapy: WFL for tasks assessed/performed Overall Cognitive Status: Within Functional Limits for tasks assessed  Exercises Other Exercises Other Exercises: sit to stand x 5 from recliner    General Comments General comments (skin integrity, edema, etc.): VSS on RA      Pertinent Vitals/Pain Pain Assessment Pain Assessment: Faces Faces Pain Scale:  Hurts even more Pain Location: R>L knees Pain Descriptors / Indicators: Sore Pain Intervention(s): Monitored during session    Home Living                          Prior Function            PT Goals (current goals can now be found in the care plan section) Acute Rehab PT Goals Patient Stated Goal: to return home from hospital independent PT Goal Formulation: With patient Time For Goal Achievement: 10/03/21 Potential to Achieve Goals: Good Progress towards PT goals: Progressing toward goals    Frequency    Min 3X/week      PT Plan Current plan remains appropriate    Co-evaluation              AM-PAC PT "6 Clicks" Mobility   Outcome Measure  Help needed turning from your back to your side while in a flat bed without using bedrails?: A Little Help needed moving from lying on your back to sitting on the side of a flat bed without using bedrails?: A Little Help needed moving to and from a bed to a chair (including a wheelchair)?: A Lot Help needed standing up from a chair using your arms (e.g., wheelchair or bedside chair)?: A Lot Help needed to walk in hospital room?: A Lot Help needed climbing 3-5 steps with a railing? : Total 6 Click Score: 13    End of Session Equipment Utilized During Treatment: Gait belt Activity Tolerance: Patient tolerated treatment well Patient left: in chair;with call bell/phone within reach;with family/visitor present Nurse Communication: Mobility status PT Visit Diagnosis: Other abnormalities of gait and mobility (R26.89);Pain Pain - Right/Left:  (BIl) Pain - part of body: Knee     Time: 1350-1444 PT Time Calculation (min) (ACUTE ONLY): 54 min  Charges:  $Gait Training: 23-37 mins $Therapeutic Exercise: 23-37 mins                     Ritisha Deitrick A. Gilford Rile PT, DPT Acute Rehabilitation Services Office [phone]    Linna Hoff 09/22/2021, 4:20 PM

## 2021-09-22 NOTE — Progress Notes (Signed)
Cortrosyn given. Phlebotomist informed and states, "there are only 2 of [name] right now but I will be there as soon as possible."

## 2021-09-22 NOTE — Progress Notes (Signed)
Daily Progress Note Intern Pager: [phone]  Patient name: [name] Medical record number: [id number] Date of birth: October 22, 1970 Age: 51 y.o. Gender: female  Primary Care Provider: Geraldine Contras, NP Consultants: Nephrology (s/o); Neurology (s/o), CIR Code Status: Full  Pt Overview and Major Events to Date:  6/27- admitted 6/29- AKI resolved  Assessment and Plan: [name] is a 51yo F who initially presented after a fall 2/2 seizure with an AKI and rhabdomyolysis after being immobile for 2 days. Her AKI is now resolved. She is awaiting CIR placement. Pertinent PMH/PSH includes seizure disorder, severe OA of the bilateral knees, morbid obesity, chronic pain, anxiety, hypotension.   Hypotension BP has been within acceptable limits for the past 24 hours. ACTH Stim test collected this am  -Midodrine 5 mg 3 times daily with meals -Follow-up ACTH stim test, if inappropriate, could consider adding Florinef -Follow-up aldosterone/renin activity ratio -Continue vitals per floor protocol   Poorly controlled type 2 diabetes mellitus (HCC) Glucose 100s-300s. Received 32 units 70-30 twice and 33 units of SAI in past 24 hours.  - Continue 32u BID  - Continue CBGs, rSSI   Chronic pain Patient with severe arthritis of the lower back and bilateral knees. Imagining show significant osteoarthritis at the L3- L4 & L4-L5 of lumbar spine and bilateral knee. Home medication include chronic oxycodone 10 mg every 6 hours as needed, fentanyl patch every Q3D and lidocaine patch. She is largely immobile. Her arthritis and immobility could also be contributing to her frequent falls. Recommended for CIR.  -Continue Lidocaine patch -Gabapentin 300mg  QHS -PT/OT following, awaiting CIT placement -Tylenol 650 mg every 6 hours -Continue home oxy as needed -Fentanyl patch 50 mcg every 72 hours -Bowel regimen: continue senna  Hypomagnesemia 1.7 yesterday, received 2g. 1.6 today. - Will  give another 2g  Anxiety Well-controlled at this time - Continue Cymbalta 60mg  BID  Seizure disorder (HCC) No seizure activity this hospitalization. Stable on current regimen of Keppra 750 mg BID.    Loss of consciousness (HCC)-resolved as of 09/22/2021 Reported 4 episodes of loss of consciousness in the 2 weeks prior to admission. Workup with orthostatics, Echo and EEG unremarkable. No further episodes since hospitalization. Consider hypoglycemia and hypotension as cause of loss of consciousness.    Urinary retention-resolved as of 09/22/2021 Required two I/O catheterizations for urinary retention but has since been voiding spontaneously without retention.      FEN/GI: Carb-modified PPx: Lovenox  Dispo:CIR  pending bed availability .    Subjective:  Patient reports feeling essentially unchanged today compared to previous.  We discussed the reasons for collecting the ACTH stim test which was drawn this morning.  He is eager to hear back regarding the results of this test, frustrated that she does not know why she has had several falls in the past few weeks.  Objective: Temp:  [97.7 F (36.5 C)-98.3 F (36.8 C)] 97.8 F (36.6 C) (07/02 0328) Pulse Rate:  [67-84] 67 (07/02 0328) Resp:  [18-20] 18 (07/02 0328) BP: (111-135)/(60-88) 135/71 (07/02 0328) SpO2:  [95 %] 95 % (07/01 1603) Weight:  [130.2 kg] 130.2 kg (07/02 0500) Physical Exam: General: Obese, awake on her phone, NAD Cardiovascular: Regular rate, regular rhythm, no murmur Respiratory: Normal work of breathing on room air, lungs clear to auscultation in all fields Abdomen: Obese, soft, nontender Extremities: Tenderness along joint lines to bilateral knees  Laboratory: Most recent CBC Lab Results  Component Value Date   WBC 11.8 (  H) 09/19/2021   HGB 12.8 09/19/2021   HCT 37.9 09/19/2021   MCV 93.3 09/19/2021   PLT 193 09/19/2021   Most recent BMP    Latest Ref Rng & Units 09/23/2021    4:01 AM  BMP   Glucose 70 - 99 mg/dL 086   BUN 6 - 20 mg/dL 13   Creatinine 5.78 - 1.00 mg/dL 4.69   Sodium 629 - 528 mmol/L 140   Potassium 3.5 - 5.1 mmol/L 4.2   Chloride 98 - 111 mmol/L 104   CO2 22 - 32 mmol/L 27   Calcium 8.9 - 10.3 mg/dL 9.2     Other pertinent labs  ACTH Stim test collected Mag 1.6   Imaging/Diagnostic Tests: No new imaging, tests.   Heather Amel, MD 09/23/2021, 5:45 AM  PGY-2, [facility] FPTS Intern pager: [phone], text pages welcome Secure chat group [facility] Teaching Service
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
## 2021-09-22 NOTE — Progress Notes (Signed)
FMTS Brief Note Reviewed patient's vitals, recent notes.  Vitals:   09/22/21 1603 09/22/21 1934  BP: 111/75 120/88  Pulse: 84 82  Resp:  18  Temp:  97.7 F (36.5 C)  SpO2: 95%    At this time, no change in plan from day progress note.  Will attempt to coordinate am ACTH stim test with RN and phlebotomy.  Alcus Dad, MD Page [phone] with questions about this patient.

## 2021-09-22 NOTE — Assessment & Plan Note (Addendum)
Mg repleted to 2.0. -Follow clinically

## 2021-09-23 DIAGNOSIS — N179 Acute kidney failure, unspecified: Secondary | ICD-10-CM | POA: Diagnosis not present

## 2021-09-23 LAB — BASIC METABOLIC PANEL
Anion gap: 9 (ref 5–15)
BUN: 13 mg/dL (ref 6–20)
CO2: 27 mmol/L (ref 22–32)
Calcium: 9.2 mg/dL (ref 8.9–10.3)
Chloride: 104 mmol/L (ref 98–111)
Creatinine, Ser: 0.89 mg/dL (ref 0.44–1.00)
GFR, Estimated: >60 mL/min (ref >60)
Glucose, Bld: 213 mg/dL — ABNORMAL HIGH (ref 70–99)
Potassium: 4.2 mmol/L (ref 3.5–5.1)
Sodium: 140 mmol/L (ref 135–145)

## 2021-09-23 LAB — GLUCOSE, CAPILLARY
Glucose-Capillary: 212 mg/dL — ABNORMAL HIGH (ref 70–99)
Glucose-Capillary: 238 mg/dL — ABNORMAL HIGH (ref 70–99)
Glucose-Capillary: 233 mg/dL — ABNORMAL HIGH (ref 70–99)
Glucose-Capillary: 270 mg/dL — ABNORMAL HIGH (ref 70–99)

## 2021-09-23 LAB — ACTH STIMULATION, 3 TIME POINTS
Cortisol, 30 Min: 17.9 ug/dL
Cortisol, 60 Min: 21 ug/dL
Cortisol, Base: 3.8 ug/dL

## 2021-09-23 LAB — MAGNESIUM: Magnesium: 1.6 mg/dL — ABNORMAL LOW (ref 1.7–2.4)

## 2021-09-23 MED ORDER — MAGNESIUM SULFATE 2 GM/50ML IV SOLN
2.0000 g | Freq: Once | INTRAVENOUS | Status: AC
Start: 2021-09-23 — End: 2021-09-23
  Administered 2021-09-23: 2 g via INTRAVENOUS
  Filled 2021-09-23: qty 50

## 2021-09-23 MED ORDER — ENOXAPARIN SODIUM 80 MG/0.8ML IJ SOSY
65.0000 mg | PREFILLED_SYRINGE | INTRAMUSCULAR | Status: DC
  Administered 2021-09-24 – 2021-09-28 (×5): 65 mg via SUBCUTANEOUS
  Filled 2021-09-23 (×5): qty 0.8

## 2021-09-23 MED ORDER — MIDODRINE HCL 5 MG PO TABS
10.0000 mg | ORAL_TABLET | Freq: Three times a day (TID) | ORAL | Status: DC
  Administered 2021-09-23 – 2021-09-28 (×17): 10 mg via ORAL
  Filled 2021-09-23 (×16): qty 2

## 2021-09-23 MED ORDER — ORAL CARE MOUTH RINSE: 15.0000 mL | OROMUCOSAL | Status: DC | PRN

## 2021-09-24 DIAGNOSIS — N179 Acute kidney failure, unspecified: Secondary | ICD-10-CM | POA: Diagnosis not present

## 2021-09-24 LAB — GLUCOSE, CAPILLARY
Glucose-Capillary: 150 mg/dL — ABNORMAL HIGH (ref 70–99)
Glucose-Capillary: 198 mg/dL — ABNORMAL HIGH (ref 70–99)
Glucose-Capillary: 244 mg/dL — ABNORMAL HIGH (ref 70–99)
Glucose-Capillary: 197 mg/dL — ABNORMAL HIGH (ref 70–99)

## 2021-09-24 LAB — MAGNESIUM: Magnesium: 1.6 mg/dL — ABNORMAL LOW (ref 1.7–2.4)

## 2021-09-24 MED ORDER — MAGNESIUM SULFATE 2 GM/50ML IV SOLN
2.0000 g | Freq: Once | INTRAVENOUS | Status: AC
Start: 2021-09-24 — End: 2021-09-24
  Administered 2021-09-24: 2 g via INTRAVENOUS
  Filled 2021-09-24: qty 50

## 2021-09-24 MED ORDER — BUSPIRONE HCL 5 MG PO TABS
7.5000 mg | ORAL_TABLET | Freq: Two times a day (BID) | ORAL | Status: DC
  Administered 2021-09-24 – 2021-09-28 (×10): 7.5 mg via ORAL
  Filled 2021-09-24 (×10): qty 2

## 2021-09-24 NOTE — Progress Notes (Signed)
Occupational Therapy Treatment Patient Details Name: [name] MRN: [id number] DOB: December 24, 1970 Today's Date: 09/24/2021   History of present illness 51 y/o  F brought in 09/18/21 for B/L knee pain s/p fall at home following a seizure episode. Reports 4 syncopal episodes in past 2 weeks (?seizures). On the floor for two days. +AKI; dehydration; rhabdomyolysis; uncontrolled DM; xray knee shows severe arthritis. PMH- morbid obesity, DM, HTN, chronic pain syndrome, seizure disorder.   OT comments  Pt progressing well towards OT goals.  Completing transfers with min assist using RW, mobility to/from sink with min assist to min guard for safety using RW.  She requires max assist for LB dressing and utilize ted hose during session.  Highly recommend AIR to optimize independence and safety. Will follow.   BP supine no ted hose 96/61 BP supine with ted hose 114/56 BP post session with ted hose 127/64    Recommendations for follow up therapy are one component of a multi-disciplinary discharge planning process, led by the attending physician.  Recommendations may be updated based on patient status, additional functional criteria and insurance authorization.    Follow Up Recommendations  Acute inpatient rehab (3hours/day)    Assistance Recommended at Discharge    Patient can return home with the following  A lot of help with bathing/dressing/bathroom;Assist for transportation;A little help with walking and/or transfers;Assistance with cooking/housework;Help with stairs or ramp for entrance   Equipment Recommendations  Other (comment) (defer)    Recommendations for Other Services Rehab consult    Precautions / Restrictions Precautions Precautions: Fall;Other (comment) Precaution Comments: seizure Required Braces or Orthoses: Other Brace Other Brace: uses R AFO when walking; pt to have someone bring it in and her shoes Restrictions Weight Bearing Restrictions: No       Mobility Bed  Mobility Overal bed mobility: Needs Assistance Bed Mobility: Supine to Sit     Supine to sit: Min assist          Transfers                         Balance Overall balance assessment: Needs assistance Sitting-balance support: No upper extremity supported, Feet supported Sitting balance-Leahy Scale: Fair     Standing balance support: No upper extremity supported, Bilateral upper extremity supported, During functional activity Standing balance-Leahy Scale: Poor Standing balance comment: relies on BUE Support dynamically, able to engage in ADLs grooming at sink with 0 hand support min guard                           ADL either performed or assessed with clinical judgement   ADL Overall ADL's : Needs assistance/impaired     Grooming: Wash/dry hands;Min guard;Standing               Lower Body Dressing: Maximal assistance;Sit to/from stand Lower Body Dressing Details (indicate cue type and reason): assist for socks and ted hose supine, min assist in standing Toilet Transfer: Minimal assistance;Ambulation;Rolling walker (2 wheels)           Functional mobility during ADLs: Minimal assistance;Rolling walker (2 wheels)      Extremity/Trunk Assessment              Vision       Perception     Praxis      Cognition Arousal/Alertness: Awake/alert Behavior During Therapy: WFL for tasks assessed/performed Overall Cognitive Status: Within Functional Limits for tasks assessed  Exercises      Shoulder Instructions       General Comments VSS on RA; BP improved with TED HOSE and maintained during session    Pertinent Vitals/ Pain       Pain Assessment Pain Assessment: Faces Faces Pain Scale: Hurts even more Pain Location: R>L knees, LBP Pain Descriptors / Indicators: Sore Pain Intervention(s): Monitored during session, Repositioned  Home Living                                           Prior Functioning/Environment              Frequency  Min 2X/week        Progress Toward Goals  OT Goals(current goals can now be found in the care plan section)  Progress towards OT goals: Progressing toward goals  Acute Rehab OT Goals Patient Stated Goal: get to rehab OT Goal Formulation: With patient Time For Goal Achievement: 10/03/21 Potential to Achieve Goals: Good  Plan Discharge plan remains appropriate;Frequency remains appropriate    Co-evaluation                 AM-PAC OT "6 Clicks" Daily Activity     Outcome Measure   Help from another person eating meals?: None Help from another person taking care of personal grooming?: A Little Help from another person toileting, which includes using toliet, bedpan, or urinal?: A Lot Help from another person bathing (including washing, rinsing, drying)?: A Lot Help from another person to put on and taking off regular upper body clothing?: A Little Help from another person to put on and taking off regular lower body clothing?: A Lot 6 Click Score: 16    End of Session Equipment Utilized During Treatment: Gait belt;Rolling walker (2 wheels)  OT Visit Diagnosis: Unsteadiness on feet (R26.81);Muscle weakness (generalized) (M62.81);Pain Pain - Right/Left: Right Pain - part of body: Leg   Activity Tolerance Patient tolerated treatment well   Patient Left in chair;with call bell/phone within reach;with chair alarm set   Nurse Communication Mobility status        Time: 1209-1228 OT Time Calculation (min): 19 min  Charges: OT General Charges $OT Visit: 1 Visit OT Treatments $Self Care/Home Management : 8-22 mins  Jolaine Artist, OT Acute Rehabilitation Services Office [phone]   Delight Stare 09/24/2021, 2:40 PM

## 2021-09-24 NOTE — Progress Notes (Signed)
Physical Therapy Treatment Patient Details Name: [name] MRN: [id number] DOB: 05-17-1970 Today's Date: 09/24/2021   History of Present Illness 51 y/o  F brought in 09/18/21 for B/L knee pain s/p fall at home following a seizure episode. Reports 4 syncopal episodes in past 2 weeks (?seizures). On the floor for two days. +AKI; dehydration; rhabdomyolysis; uncontrolled DM; xray knee shows severe arthritis. PMH- morbid obesity, DM, HTN, chronic pain syndrome, seizure disorder.    PT Comments    Pt received in recliner, agreeable to therapy session and with good participation and tolerance for gait progression longer household distance, pt did need seated break prior to completion of total distance due to evolving severe R knee pain. Pt reports her apt for cortisone shot in her R knee is due and was wondering if this would be possible to be done acutely, RN/MD team notified. Pt VSS on RA, emphasis on safe body mechanics and activity pacing. Pt continues to benefit from PT services to progress toward functional mobility goals.   Recommendations for follow up therapy are one component of a multi-disciplinary discharge planning process, led by the attending physician.  Recommendations may be updated based on patient status, additional functional criteria and insurance authorization.  Follow Up Recommendations  Acute inpatient rehab (3hours/day)     Assistance Recommended at Discharge Intermittent Supervision/Assistance  Patient can return home with the following A lot of help with walking and/or transfers;Assistance with cooking/housework;Assist for transportation;Help with stairs or ramp for entrance;A lot of help with bathing/dressing/bathroom   Equipment Recommendations  None recommended by PT    Recommendations for Other Services Rehab consult     Precautions / Restrictions Precautions Precautions: Fall;Other (comment) Precaution Comments: seizure Required Braces or Orthoses: Other  Brace Other Brace: uses R AFO when walking; pt to have someone bring it in tonight and her shoes Restrictions Weight Bearing Restrictions: No     Mobility  Bed Mobility               General bed mobility comments: pt received in recliner and agreeable to return there    Transfers Overall transfer level: Needs assistance Equipment used: Rolling walker (2 wheels) Transfers: Sit to/from Stand Sit to Stand: Min assist, Min guard           General transfer comment: minA with slight fatigue on second transfer, min guard initally from chair; increased time to perform/rise    Ambulation/Gait Ambulation/Gait assistance: Min assist, Min guard Gait Distance (Feet): 130 Feet (164f, seated break, 291f Assistive device: Rolling walker (2 wheels) Gait Pattern/deviations: Decreased stride length, Decreased dorsiflexion - right, Knee flexed in stance - left, Knee flexed in stance - right, Trunk flexed       General Gait Details: heavy use of RW. R foot drop at baseline demoing steppage gait pattern. Cues for posture and RW proximity   Stairs Stairs:  (verbal review for safe technique, pt defer today due to increased R>L knee pain)             Balance Overall balance assessment: Needs assistance Sitting-balance support: No upper extremity supported, Feet supported Sitting balance-Leahy Scale: Fair     Standing balance support: No upper extremity supported, Bilateral upper extremity supported, During functional activity Standing balance-Leahy Scale: Poor Standing balance comment: relies on BUE Support dynamically, able to engage in ADLs grooming at sink with 0 hand support min guard  Cognition Arousal/Alertness: Awake/alert Behavior During Therapy: WFL for tasks assessed/performed Overall Cognitive Status: Within Functional Limits for tasks assessed                                 General Comments: pleasantly  cooperative           General Comments General comments (skin integrity, edema, etc.): BP 112/73 (82) reclined post-exertion, no dizziness with transfers/gait; HR/SpO2 WFL on RA      Pertinent Vitals/Pain Pain Assessment Pain Assessment: 0-10 Pain Score: 9  Pain Location: R>L knees, LBP after longer distance gait trial Pain Descriptors / Indicators: Sore Pain Intervention(s): Monitored during session, Repositioned, Patient requesting pain meds-RN notified, Ice applied           PT Goals (current goals can now be found in the care plan section) Acute Rehab PT Goals Patient Stated Goal: to return home from hospital independent and less B knee pain PT Goal Formulation: With patient Time For Goal Achievement: 10/03/21 Progress towards PT goals: Progressing toward goals    Frequency    Min 3X/week      PT Plan Current plan remains appropriate       AM-PAC PT "6 Clicks" Mobility   Outcome Measure  Help needed turning from your back to your side while in a flat bed without using bedrails?: A Little Help needed moving from lying on your back to sitting on the side of a flat bed without using bedrails?: A Little Help needed moving to and from a bed to a chair (including a wheelchair)?: A Lot Help needed standing up from a chair using your arms (e.g., wheelchair or bedside chair)?: A Lot Help needed to walk in hospital room?: A Little Help needed climbing 3-5 steps with a railing? : Total 6 Click Score: 14    End of Session Equipment Utilized During Treatment: Gait belt Activity Tolerance: Patient tolerated treatment well Patient left: in chair;with call bell/phone within reach;with chair alarm set Nurse Communication: Mobility status;Patient requests pain meds PT Visit Diagnosis: Other abnormalities of gait and mobility (R26.89);Pain Pain - Right/Left:  (R>L) Pain - part of body: Knee     Time: 7628-3151 PT Time Calculation (min) (ACUTE ONLY): 26 min  Charges:   $Gait Training: 8-22 mins $Therapeutic Activity: 8-22 mins                     Heather Durkin P., PTA Acute Rehabilitation Services Secure Chat Preferred 9a-5:30pm Office: [place] 09/24/2021, 3:43 PM

## 2021-09-24 NOTE — Progress Notes (Signed)
Inpatient Rehab Admissions Coordinator:   Awaiting insurance determination regarding CIR prior auth request.  Will continue to follow.   Shann Medal, PT, DPT Admissions Coordinator [phone] 09/24/21  11:56 AM

## 2021-09-24 NOTE — Progress Notes (Signed)
PT Cancellation Note  Patient Details Name: [name] MRN: [id number] DOB: 16-Nov-1970   Cancelled Treatment:    Reason Eval/Treat Not Completed: (P) Patient declined, no reason specified (eating will follow after she is finished.)   Cristela Blue 09/24/2021, 2:36 PM  Erasmo Leventhal , PTA [place] Office [phone]

## 2021-09-24 NOTE — Progress Notes (Addendum)
Daily Progress Note Intern Pager: [phone]  Patient name: [name] Medical record number: [id number] Date of birth: [birth year] Age: 51 y.o. Gender: female  Primary Care Provider: Pecolia Ades, NP Consultants: Nephro (s/o), neuro (s/o), CIR  Code Status: Full  Pt Overview and Major Events to Date:  6/27 - admitted 6/29 - AKI resolved 7/2 - hypotension resolved  Assessment and Plan:  [name] is a 51 y.o. female who presented with prerenal AKI and rhabdomyolysis, both of which are improving. She is now awaiting CIR placement. Pertinent PMH/PSH includes T2DM, hypotension, chronic pain syndrome, seizure disorder, and hypothyroidism.   Hypotension BP has been within acceptable limits for the past 48 hours. Adrenal insufficiency less likely given peak cortisol >18 mcg/dL. Likely 2/2 decreased PO intake given improvement with increased PO in hospital. -Midodrine 10 mg 3 times daily with meals -Follow-up aldosterone/renin activity ratio -Continue vitals per floor protocol   Poorly controlled type 2 diabetes mellitus (HCC) Glucose stable in 200s over last 24 hours. - Continue 32u BID  - Continue CBGs, rSSI (24u aspart last 24 hours), carb-modified diet   Chronic pain Pain from severe OA stable on med regimen. Awaiting CIR placement given persistent pain and immobility. -Continue Lidocaine patch -Gabapentin '300mg'$  QHS -PT/OT following, awaiting CIR placement -Tylenol 650 mg every 6 hours -Continue home oxy as needed -Fentanyl patch 50 mcg every 72 hours -Bowel regimen: continue senna  Hypomagnesemia Mg remains at 1.6 s/p repletion yesterday. 2g given this morning. -F/u Mg in the morning s/p repletion  Anxiety Stable. -Continue Cymbalta '60mg'$  BID  Seizure disorder ([place]) No seizure activity this hospitalization. Stable on current regimen. -Continue Keppra 750 mg BID   Loss of consciousness (HCC)-resolved as of 09/22/2021 Reported 4 episodes of  loss of consciousness in the 2 weeks prior to admission. Workup with orthostatics, Echo and EEG unremarkable. No further episodes since hospitalization. Consider hypoglycemia and hypotension as cause of loss of consciousness.    Urinary retention-resolved as of 09/22/2021 Required two I/O catheterizations for urinary retention but has since been voiding spontaneously without retention.   FEN/GI: Carb modified diet PPx: Lovenox Dispo:CIR  pending bed, insurance determination .  Subjective:  [name] is doing well this morning. She has not had any episodes of dizziness since admission. Her medication regimen is controlling her chronic pain.  Objective: Temp:  [97.8 F (36.6 C)-98.1 F (36.7 C)] 97.8 F (36.6 C) (07/03 0805) Pulse Rate:  [50-69] 67 (07/03 0805) Resp:  [20] 20 (07/03 0328) BP: (94-146)/(46-95) 119/74 (07/03 0805) SpO2:  [96 %] 96 % (07/02 1542) Weight:  [129.8 kg] 129.8 kg (07/03 0428) Physical Exam: General: Sitting up in bed eating breakfast, conversant, in NAD Cardiovascular: Regular rate Respiratory: Breathing and speaking comfortably on RA Abdomen: Non-distended Extremities: Moves all extremities grossly equally  Laboratory: Most recent CBC Lab Results  Component Value Date   WBC 11.8 (H) 09/19/2021   HGB 12.8 09/19/2021   HCT 37.9 09/19/2021   MCV 93.3 09/19/2021   PLT 193 09/19/2021   Most recent BMP    Latest Ref Rng & Units 09/23/2021    4:01 AM  BMP  Glucose 70 - 99 mg/dL 213   BUN 6 - 20 mg/dL 13   Creatinine 0.44 - 1.00 mg/dL 0.89   Sodium 135 - 145 mmol/L 140   Potassium 3.5 - 5.1 mmol/L 4.2   Chloride 98 - 111 mmol/L 104   CO2 22 - 32 mmol/L 27  Calcium 8.9 - 10.3 mg/dL 9.2     Other pertinent labs: ACTH stim test: -base cortisol: 3.8 -30 min cortisol: 17.9 -60 min cortisol: 21.0  Ethelene Hal, MD 09/24/2021, 9:46 AM PGY-1, [place] Intern pager: [phone], text pages welcome Secure chat group [place]

## 2021-09-25 DIAGNOSIS — N179 Acute kidney failure, unspecified: Secondary | ICD-10-CM | POA: Diagnosis not present

## 2021-09-25 LAB — GLUCOSE, CAPILLARY
Glucose-Capillary: 197 mg/dL — ABNORMAL HIGH (ref 70–99)
Glucose-Capillary: 185 mg/dL — ABNORMAL HIGH (ref 70–99)
Glucose-Capillary: 153 mg/dL — ABNORMAL HIGH (ref 70–99)
Glucose-Capillary: 227 mg/dL — ABNORMAL HIGH (ref 70–99)

## 2021-09-25 LAB — MAGNESIUM: Magnesium: 1.6 mg/dL — ABNORMAL LOW (ref 1.7–2.4)

## 2021-09-25 MED ORDER — DULOXETINE HCL 60 MG PO CPEP: 60.0000 mg | ORAL_CAPSULE | Freq: Two times a day (BID) | ORAL | 3 refills | Status: DC

## 2021-09-25 MED ORDER — MIDODRINE HCL 10 MG PO TABS: 10.0000 mg | ORAL_TABLET | Freq: Three times a day (TID) | ORAL | 0 refills | Status: DC

## 2021-09-25 MED ORDER — FENTANYL 50 MCG/HR TD PT72: 1.0000 | MEDICATED_PATCH | TRANSDERMAL | 0 refills | Status: DC

## 2021-09-25 MED ORDER — GABAPENTIN 600 MG PO TABS: 300.0000 mg | ORAL_TABLET | Freq: Three times a day (TID) | ORAL | Status: DC

## 2021-09-25 MED ORDER — MAGNESIUM SULFATE 2 GM/50ML IV SOLN
2.0000 g | Freq: Once | INTRAVENOUS | Status: AC
Start: 2021-09-25 — End: 2021-09-25
  Administered 2021-09-25: 2 g via INTRAVENOUS
  Filled 2021-09-25: qty 50

## 2021-09-25 MED ORDER — NOVOLOG MIX 70/30 FLEXPEN (70-30) 100 UNIT/ML ~~LOC~~ SUPN: PEN_INJECTOR | SUBCUTANEOUS | 11 refills | Status: DC

## 2021-09-25 MED ORDER — BUSPIRONE HCL 7.5 MG PO TABS: 7.5000 mg | ORAL_TABLET | Freq: Two times a day (BID) | ORAL | 0 refills | Status: DC

## 2021-09-25 MED ORDER — MAGNESIUM SULFATE 2 GM/50ML IV SOLN
2.0000 g | Freq: Once | INTRAVENOUS | Status: AC
  Administered 2021-09-25: 2 g via INTRAVENOUS
  Filled 2021-09-25: qty 50

## 2021-09-25 NOTE — Progress Notes (Signed)
Inpatient Rehab Admissions Coordinator:   Notified by [facility] of denial for CIR prior auth request.  I will update pt at bedside today. MD and [facility] team aware.   Shann Medal, PT, DPT Admissions Coordinator [phone] 09/25/21  9:57 AM

## 2021-09-25 NOTE — Progress Notes (Signed)
Daily Progress Note Intern Pager: [phone]  Patient name: [name] Medical record number: [id number] Date of birth: [birth year] Age: 51 y.o. Gender: female  Primary Care Provider: Pecolia Ades, NP Consultants: Nephro, neuro (s/o) Code Status: Full  Pt Overview and Major Events to Date:  6/27 - admitted 6/29 - AKI resolved 7/2 - hypotension resolved  Assessment and Plan:  [name] is a 51 y.o. female who presented with prerenal AKI and rhabdomyolysis, both of which are improved. She is medically stable, but has been denied CIR placement. She is opting for heading home once she has walker delivered. Pertinent PMH/PSH includes T2DM, hypotension, chronic pain syndrome, seizure disorder, and hypothyroidism.   Hypotension BP has been within acceptable limits over last couple of days. Likely 2/2 decreased PO intake given improvement with increased PO in hospital. -Midodrine 10 mg 3 times daily with meals -Follow-up aldosterone/renin activity ratio -Continue vitals per floor protocol   Poorly controlled type 2 diabetes mellitus (HCC) Glucose stable in 200s over last couple of days. - Continue 32u BID  - Continue CBGs, rSSI (11u aspart last 24 hours), carb-modified diet   Chronic pain Pain from severe OA stable on med regimen. -Continue Lidocaine patch -Gabapentin '300mg'$  QHS -PT/OT following -Tylenol 650 mg every 6 hours -Continue home oxy as needed -Fentanyl patch 50 mcg every 72 hours -Bowel regimen: continue senna  Hypomagnesemia Mg remains at 1.6 s/p repletion yesterday. 2g given again this morning. -2g Mg to be given this afternoon around 4 PM -F/u Mg in the morning s/p repletion  Anxiety Stable. -Continue Cymbalta '60mg'$  BID -Continue added Buspar 7.5 mg BID  Seizure disorder (HCC) No seizure activity this hospitalization. Stable on current regimen. -Continue Keppra 750 mg BID   Loss of consciousness (HCC)-resolved as of  09/22/2021 Reported 4 episodes of loss of consciousness in the 2 weeks prior to admission. Workup with orthostatics, Echo and EEG unremarkable. No further episodes since hospitalization. Consider hypoglycemia and hypotension as cause of loss of consciousness.    Urinary retention-resolved as of 09/22/2021 Required two I/O catheterizations for urinary retention but has since been voiding spontaneously without retention.   FEN/GI: Carb modified PPx: Lovenox Dispo: Due to denied CIR, will get home health for further rehabilitation.  Subjective:  [name]'s knees are hurting her this morning, though she has lidocaine patches on which help. She is amenable to working with T[facility] to get a walker when she heads home with home health.  Objective: Temp:  [97.7 F (36.5 C)-98.2 F (36.8 C)] 98.2 F (36.8 C) (07/04 0044) Pulse Rate:  [55-77] 70 (07/04 0044) Resp:  [18-20] 18 (07/04 0044) BP: (109-144)/(57-79) 138/79 (07/04 0044) SpO2:  [94 %-95 %] 95 % (07/03 1628) Physical Exam: General: Lying in bed, conversant, in NAD Cardiovascular: Regular rate Respiratory: Breathing and speaking comfortably on RA Abdomen: Non-distended Extremities: Moves extremities grossly equally  Laboratory: Most recent CBC Lab Results  Component Value Date   WBC 11.8 (H) 09/19/2021   HGB 12.8 09/19/2021   HCT 37.9 09/19/2021   MCV 93.3 09/19/2021   PLT 193 09/19/2021   Most recent BMP    Latest Ref Rng & Units 09/23/2021    4:01 AM  BMP  Glucose 70 - 99 mg/dL 213   BUN 6 - 20 mg/dL 13   Creatinine 0.44 - 1.00 mg/dL 0.89   Sodium 135 - 145 mmol/L 140   Potassium 3.5 - 5.1 mmol/L 4.2   Chloride 98 - 111  mmol/L 104   CO2 22 - 32 mmol/L 27   Calcium 8.9 - 10.3 mg/dL 9.2     Ethelene Hal, MD 09/25/2021, 8:46 AM PGY-1, [place] Intern pager: [phone], text pages welcome Secure chat group [place]

## 2021-09-25 NOTE — Discharge Instructions (Addendum)
Dear [name],   Thank you for letting us participate in your care! In this section, you will find a brief hospital admission summary of why you were admitted to the hospital, what happened during your admission, your diagnosis/diagnoses, and recommended follow up.  You were admitted because you had a fall after having dizziness with flashing in your eyes. Your testing revealed kidney injury which resolved.  You were diagnosed with rhabdomyolysis, seizure, and hypotension (low blood pressure). You were treated with anti-seizure medication (Keppra), intravenous fluids, and a medication to help with keeping your blood pressure high enough (Midodrine) You were also seen by our Nephrologists (kidney doctors). They recommended intravenous fluids.  Your condition improved and you were discharged from the hospital for meeting this goal with home health.   POST-HOSPITAL & CARE INSTRUCTIONS Continue to take your Keppra as prescribed. Continue to take Midodrine as prescribed. You will be working with home health, and the agency should contact you soon. Please let PCP/Specialists know of any changes in medications that were made.  Please see medications section of this packet for any medication changes.   DOCTOR'S APPOINTMENTS & FOLLOW UP Future Appointments  Date Time Provider [place]  10/01/2021 11:30 AM Martyn Malay, MD [facility]    Thank you for choosing [facility]! Take care and be well!  [facility]  [place], [place] [address] [phone]  Plate Method for Diabetes   Foods with carbohydrates make your blood glucose level go up. The plate method is a simple way to meal plan and control the amount of carbohydrate you eat.         Use the following guidance to build a healthy plate to control carbohydrates. Divide a 9-inch plate into 3 sections, and consider your  beverage the 4th section of your meal: Food Group Examples of Foods/Beverages for This Section of your Meal  Section 1: Non-starchy vegetables Fill  of your plate to include non-starchy vegetables Asparagus, broccoli, brussels sprouts, cabbage, carrots, cauliflower, celery, cucumber, green beans, mushrooms, peppers, salad greens, tomatoes, or zucchini.  Section 2: Protein foods Fill  of your plate to include a lean protein Lean meat, poultry, fish, seafood, cheese, eggs, lean deli meat, tofu, beans, lentils, nuts or nut butters.  Section 3: Carbohydrate foods Fill  of your plate to include carbohydrate foods Whole grains, whole wheat bread, brown rice, whole grain pasta, polenta, corn tortillas, fruit, or starchy vegetables (potatoes, green peas, corn, beans, acorn squash, and butternut squash). One cup of milk also counts as a food that contains carbohydrate.  Section 4: Beverage Choose water or a low-calorie drink for your beverage. Unsweetened tea, coffee, or flavored/sparkling water without added sugar.  Image reprinted with permission from The American Diabetes Association.  Copyright 2022 by the American Diabetes Association.   Copyright 2022  Academy of Nutrition and Dietetics. All rights reserved

## 2021-09-25 NOTE — TOC Initial Note (Signed)
Transition of Care [facility]) - Initial/Assessment Note    Patient Details  Name: [name] MRN: [id number] Date of Birth: [birth year]  Transition of Care [facility]) CM/SW Contact:    Benard Halsted, LCSW Phone Number: 09/25/2021, 12:18 PM  Clinical Narrative:                 CSW spoke with patient regarding denial for CIR and offered SNF versus home health. Patient stated she did not feel like she would do well at a SNF and would rather return home with home health services. She has a cane and bedside commode at home and is requesting a walker. RNCM to set up. CSW confirmed address and that she is hoping to go to Dr. Trina Ao office for PCP follow up. She stated she has one brother in [place] and one locally. No other needs identified at this time.   Expected Discharge Plan: [place] Barriers to Discharge: Continued Medical Work up   Patient Goals and CMS Choice Patient states their goals for this hospitalization and ongoing recovery are:: Return home CMS Medicare.gov Compare Post Acute Care list provided to:: Patient Choice offered to / list presented to : Patient  Expected Discharge Plan and Services Expected Discharge Plan: [place] In-house Referral: Clinical Social Work Discharge Planning Services: CM Consult Post Acute Care Choice: [place] arrangements for the past 2 months: [place]                                      Prior Living Arrangements/Services Living arrangements for the past 2 months: Single Family Home Lives with:: Self Patient language and need for interpreter reviewed:: Yes Do you feel safe going back to the place where you live?: Yes      Need for Family Participation in Patient Care: Yes (Comment) Care giver support system in place?: No (comment) Current home services: DME ([facility], [facility]) Criminal Activity/Legal Involvement Pertinent to Current Situation/Hospitalization: No - Comment as  needed  Activities of Daily Living Home Assistive Devices/Equipment: Walker (specify type), CBG Meter, Cane (specify quad or straight), Bedside commode/3-in-1, Wheelchair, Reacher, Hand-held shower hose ADL Screening (condition at time of admission) Patient's cognitive ability adequate to safely complete daily activities?: Yes Is the patient deaf or have difficulty hearing?: No Does the patient have difficulty seeing, even when wearing glasses/contacts?: No Does the patient have difficulty concentrating, remembering, or making decisions?: No Patient able to express need for assistance with ADLs?: Yes Does the patient have difficulty dressing or bathing?: Yes Independently performs ADLs?: Yes (appropriate for developmental age) Does the patient have difficulty walking or climbing stairs?: Yes Weakness of Legs: Left Weakness of Arms/Hands: None  Permission Sought/Granted Permission sought to share information with : Facility Sport and exercise psychologist, Family Supports Permission granted to share information with : Yes, Verbal Permission Granted     Permission granted to share info w AGENCY: HH        Emotional Assessment Appearance:: Appears stated age Attitude/Demeanor/Rapport: Engaged Affect (typically observed): Accepting, Appropriate, Pleasant Orientation: : Oriented to Self, Oriented to Place, Oriented to  Time, Oriented to Situation Alcohol / Substance Use: Not Applicable Psych Involvement: No (comment)  Admission diagnosis:  AKI (acute kidney injury) ([place]) [N17.9] Traumatic rhabdomyolysis, initial encounter ([place]) [T79.6XXA] Patient Active Problem List   Diagnosis Date Noted   Hypomagnesemia 09/22/2021   Anxiety 09/21/2021  Hypotension 09/20/2021   Rhabdomyolysis 09/18/2021   Physical deconditioning 05/16/2021   Hyponatremia 05/16/2021   Hyperkalemia 05/16/2021   Chronically on opiate therapy 05/14/2021   Seizure disorder ([place]) 03/07/2020   Depression with anxiety  51/70/0174   Umbilical hernia, incarcerated, s/p repair 10/16/2017 10/16/2017   Chronic pain of both knees 04/23/2017   Unilateral primary osteoarthritis, left knee 10/24/2016   Unilateral primary osteoarthritis, right knee 10/24/2016   Abnormal mammogram with microcalcification-Right inner lower quadrant 07/21/2013   Constipation 03/02/2011   Heel ulcer due to DM ([place]) 02/19/2011   Wound, open, hip or thigh 02/19/2011   Healing pressure ulcer stage III ([place]) 02/18/2011   Morbidly obese ([place]) 02/18/2011   Poorly controlled type 2 diabetes mellitus ([place]) 02/18/2011   Sleep apnea 02/18/2011   Hypothyroidism 02/18/2011   Chronic pain 02/18/2011   PCP:  Pecolia Ades, NP Pharmacy:   [facility], [place] [address] AT [address] [place] [address] Phone: [phone] Fax: [phone]     Social Determinants of Health (SDOH) Interventions    Readmission Risk Interventions     No data to display

## 2021-09-25 NOTE — TOC Transition Note (Signed)
Transition of Care [facility]) - CM/SW Discharge Note   Patient Details  Name: [name] MRN: [id number] Date of Birth: 12/28/1970  Transition of Care [facility]) CM/SW Contact:  Cyndi Bender, RN Phone Number: 09/25/2021, 1:29 PM   Clinical Narrative:     Patient stable for discharge. Orders for DME-walker and HH-PT. Choice offered and patient defers to [facility] to find highly rated agency. Anderson Malta with [facility] accepted referral. Patient agreeable to use in house provider adapt for walker. Spoke to [place] with adapt and walker ordered.  Address, Phone number and PCP verified-plans to use DR. Pray. Patient states she will take uber to apts and has transportation home. No other TOC needs at this time  Final next level of care: [place] Barriers to Discharge: Barriers Resolved   Patient Goals and CMS Choice Patient states their goals for this hospitalization and ongoing recovery are:: Return home CMS Medicare.gov Compare Post Acute Care list provided to:: Patient Choice offered to / list presented to : Patient  Discharge Placement                       Discharge Plan and Services In-house Referral: Clinical Social Work Discharge Planning Services: CM Consult Post Acute Care Choice: Home Health          DME Arranged: Gilford Rile rolling DME Agency: [employer] Date DME Agency Contacted: 09/25/21 Time DME Agency Contacted: 25 Representative spoke with at DME Agency: [place]: PT [place]: Well [place] Date [place]: 09/25/21 Time [place]: 5102 Representative spoke with at [place]: [place] ([place]) Interventions     Readmission Risk Interventions    09/25/2021    1:28 PM  Readmission Risk Prevention Plan  Transportation Screening Complete  PCP or Specialist Appt within 5-7 Days Complete  Home Care Screening Complete  Medication Review (RN CM) Complete

## 2021-09-26 DIAGNOSIS — N179 Acute kidney failure, unspecified: Secondary | ICD-10-CM | POA: Diagnosis not present

## 2021-09-26 LAB — MAGNESIUM: Magnesium: 2 mg/dL (ref 1.7–2.4)

## 2021-09-26 LAB — GLUCOSE, CAPILLARY
Glucose-Capillary: 218 mg/dL — ABNORMAL HIGH (ref 70–99)
Glucose-Capillary: 202 mg/dL — ABNORMAL HIGH (ref 70–99)
Glucose-Capillary: 275 mg/dL — ABNORMAL HIGH (ref 70–99)
Glucose-Capillary: 372 mg/dL — ABNORMAL HIGH (ref 70–99)

## 2021-09-26 MED ORDER — DULOXETINE HCL 60 MG PO CPEP
60.0000 mg | ORAL_CAPSULE | Freq: Every day | ORAL | Status: DC
  Administered 2021-09-27 – 2021-09-28 (×2): 60 mg via ORAL
  Filled 2021-09-26 (×2): qty 1

## 2021-09-26 MED ORDER — DULOXETINE HCL 60 MG PO CPEP: 60.0000 mg | ORAL_CAPSULE | Freq: Every day | ORAL | 3 refills | Status: DC

## 2021-09-26 MED ORDER — GABAPENTIN 600 MG PO TABS: 300.0000 mg | ORAL_TABLET | Freq: Every day | ORAL | Status: AC

## 2021-09-26 MED ORDER — NOVOLOG MIX 70/30 FLEXPEN (70-30) 100 UNIT/ML ~~LOC~~ SUPN: 40.0000 [IU] | PEN_INJECTOR | Freq: Two times a day (BID) | SUBCUTANEOUS | 0 refills | Status: DC

## 2021-09-26 NOTE — Plan of Care (Signed)
  Problem: Education: Goal: Ability to describe self-care measures that may prevent or decrease complications (Diabetes Survival Skills Education) will improve Outcome: Progressing Goal: Individualized Educational Video(s) Outcome: Progressing   Problem: Coping: Goal: Ability to adjust to condition or change in health will improve Outcome: Progressing   Problem: Fluid Volume: Goal: Ability to maintain a balanced intake and output will improve Outcome: Progressing   Problem: Health Behavior/Discharge Planning: Goal: Ability to identify and utilize available resources and services will improve Outcome: Progressing Goal: Ability to manage health-related needs will improve Outcome: Progressing   Problem: Metabolic: Goal: Ability to maintain appropriate glucose levels will improve Outcome: Progressing   Problem: Nutritional: Goal: Maintenance of adequate nutrition will improve Outcome: Progressing Goal: Progress toward achieving an optimal weight will improve Outcome: Progressing   Problem: Skin Integrity: Goal: Risk for impaired skin integrity will decrease Outcome: Progressing   Problem: Tissue Perfusion: Goal: Adequacy of tissue perfusion will improve Outcome: Progressing   Problem: Education: Goal: Knowledge of General Education information will improve Description: Including pain rating scale, medication(s)/side effects and non-pharmacologic comfort measures Outcome: Progressing   Problem: Health Behavior/Discharge Planning: Goal: Ability to manage health-related needs will improve Outcome: Progressing   Problem: Clinical Measurements: Goal: Ability to maintain clinical measurements within normal limits will improve Outcome: Progressing Goal: Will remain free from infection Outcome: Progressing Goal: Diagnostic test results will improve Outcome: Progressing Goal: Respiratory complications will improve Outcome: Progressing Goal: Cardiovascular complication will  be avoided Outcome: Progressing   Problem: Nutrition: Goal: Adequate nutrition will be maintained Outcome: Progressing   Problem: Coping: Goal: Level of anxiety will decrease Outcome: Progressing   Problem: Pain Managment: Goal: General experience of comfort will improve Outcome: Progressing   Problem: Safety: Goal: Ability to remain free from injury will improve Outcome: Progressing   Problem: Skin Integrity: Goal: Risk for impaired skin integrity will decrease Outcome: Progressing

## 2021-09-26 NOTE — Progress Notes (Signed)
Occupational Therapy Treatment Patient Details Name: [name] MRN: [id number] DOB: 1971-01-13 Today's Date: 09/26/2021   History of present illness 51 y/o  F brought in 09/18/21 for B/L knee pain s/p fall at home following a seizure episode. Reports 4 syncopal episodes in past 2 weeks (?seizures). On the floor for two days. +AKI; dehydration; rhabdomyolysis; uncontrolled DM; xray knee shows severe arthritis. PMH- morbid obesity, DM, HTN, chronic pain syndrome, seizure disorder.   OT comments  Pt verbalized concerns over d/c home alone and limited (A) to complete basic daily task. Pt considering SNF now after discussion. Ot expressed concern to obtain food, opening the door if delivered to home, management of her english bulldog, don doff ted hose and daily hygiene. Pt states "honestly I am too". Pt is most motivated by her english bulldog. Recommendations for maximized services at d/c. Recommendation is SNF at this time due to OT needs, high risk for falls and food insecurities.    Recommendations for follow up therapy are one component of a multi-disciplinary discharge planning process, led by the attending physician.  Recommendations may be updated based on patient status, additional functional criteria and insurance authorization.    Follow Up Recommendations  Skilled nursing-short term rehab (<3 hours/day) (home services if chooses home and max out services)    Assistance Recommended at Discharge Intermittent Supervision/Assistance  Patient can return home with the following  A lot of help with bathing/dressing/bathroom;Assist for transportation;A little help with walking and/or transfers;Assistance with cooking/housework;Help with stairs or ramp for entrance   Equipment Recommendations  Other (comment) (reports w/c is not an option due to space in trailer)    Recommendations for Other Services Other (comment) (Aide for home)    Precautions / Restrictions Precautions Precautions:  Fall;Other (comment) Precaution Comments: seizure Required Braces or Orthoses: Other Brace Other Brace: uses R AFO when walking; not present this admission. pt reports no one has brought it Restrictions Weight Bearing Restrictions: No       Mobility Bed Mobility Overal bed mobility: Modified Independent             General bed mobility comments: heavy use of bed rails    Transfers Overall transfer level: Needs assistance Equipment used: Rolling walker (2 wheels) Transfers: Sit to/from Stand Sit to Stand: Min guard                 Balance Overall balance assessment: Needs assistance         Standing balance support: Bilateral upper extremity supported, During functional activity, Reliant on assistive device for balance Standing balance-Leahy Scale: Poor                             ADL either performed or assessed with clinical judgement   ADL Overall ADL's : Needs assistance/impaired Eating/Feeding: Independent                       Toilet Transfer: Supervision/safety;Ambulation;BSC/3in1   Toileting- Water quality scientist and Hygiene: Supervision/safety;Sit to/from stand       Functional mobility during ADLs: Min guard;Rolling walker (2 wheels) General ADL Comments: pt with knee pain but motivated and pushing throught to complete session    Extremity/Trunk Assessment Upper Extremity Assessment Upper Extremity Assessment: Overall WFL for tasks assessed   Lower Extremity Assessment Lower Extremity Assessment: Defer to PT evaluation        Vision       Perception  Praxis      Cognition Arousal/Alertness: Awake/alert Behavior During Therapy: WFL for tasks assessed/performed Overall Cognitive Status: Within Functional Limits for tasks assessed                                 General Comments: tearful during sessin missing her dog        Exercises      Shoulder Instructions       General  Comments BP obtain supine 89/61 sitting 102/66 standing 106/80 no ted hose  after movement in room 121/100 and reports pain 8 out 10 at knees    Pertinent Vitals/ Pain       Pain Assessment Pain Assessment: 0-10 Pain Score: 8  Pain Location: knees Pain Descriptors / Indicators: Sore Pain Intervention(s): Limited activity within patient's tolerance, Monitored during session, Repositioned  Home Living                                          Prior Functioning/Environment              Frequency  Min 2X/week        Progress Toward Goals  OT Goals(current goals can now be found in the care plan section)  Progress towards OT goals: Progressing toward goals  Acute Rehab OT Goals Patient Stated Goal: to determine how to get better and maybe consider rehab in community OT Goal Formulation: With patient Time For Goal Achievement: 10/03/21 Potential to Achieve Goals: Good ADL Goals Pt Will Perform Lower Body Dressing: with mod assist;sit to/from stand;with adaptive equipment Pt Will Transfer to Toilet: with mod assist;bedside commode;stand pivot transfer Additional ADL Goal #1: pt will complete bed mobility min (A) as precursor to adls Additional ADL Goal #2: pt will complete static standing for 3 min adl tasks  Plan Discharge plan remains appropriate    Co-evaluation                 AM-PAC OT "6 Clicks" Daily Activity     Outcome Measure   Help from another person eating meals?: None Help from another person taking care of personal grooming?: A Little Help from another person toileting, which includes using toliet, bedpan, or urinal?: A Lot Help from another person bathing (including washing, rinsing, drying)?: A Lot Help from another person to put on and taking off regular upper body clothing?: A Little Help from another person to put on and taking off regular lower body clothing?: A Lot 6 Click Score: 16    End of Session Equipment Utilized  During Treatment: Rolling walker (2 wheels)  OT Visit Diagnosis: Unsteadiness on feet (R26.81);Muscle weakness (generalized) (M62.81);Pain Pain - Right/Left: Right Pain - part of body: Leg   Activity Tolerance Patient tolerated treatment well   Patient Left in chair;with call bell/phone within reach;with chair alarm set   Nurse Communication Mobility status;Precautions        Time: 2725-3664 OT Time Calculation (min): 53 min  Charges: OT General Charges $OT Visit: 1 Visit OT Treatments $Self Care/Home Management : 53-67 mins   Brynn, OTR/L  Acute Rehabilitation Services Office: [phone] .   Jeri Modena 09/26/2021, 12:10 PM
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
## 2021-09-26 NOTE — Progress Notes (Signed)
Physical Therapy Treatment Patient Details Name: [name] MRN: [id number] DOB: May 06, 1970 Today's Date: 09/26/2021   History of Present Illness 51 y/o  F brought in 09/18/21 for B/L knee pain s/p fall at home following a seizure episode. Reports 4 syncopal episodes in past 2 weeks (?seizures). On the floor for two days. +AKI; dehydration; rhabdomyolysis; uncontrolled DM; xray knee shows severe arthritis. PMH- morbid obesity, DM, HTN, chronic pain syndrome, seizure disorder.    PT Comments    Pt received in recliner, agreeable to therapy session and with good participation and fair tolerance for gait/stair instruction. Pt able to ascend/descend single platform step in room and perform transfers with up to minA, pt unable to perform tasks unassisted due to B knee pain. Pt with evolving sx dizziness/fatigue after stairs so assessed orthostatic BP with reclined/standing postures and SBP drop from 138/94 reclined to 108/94 standing with dizziness despite TED hose. Pt expressed eagerness to get cortisone shots for B knees to address chronic knee pain and progress functional mobility tasks. She has a dog at home and currently unable to care for herself and her dog without further therapies for strength/endurance building and pain control, disposition updated below per discussion with pt and supervising PT Alayna W. Pt continues to benefit from PT services to progress toward functional mobility goals.    Recommendations for follow up therapy are one component of a multi-disciplinary discharge planning process, led by the attending physician.  Recommendations may be updated based on patient status, additional functional criteria and insurance authorization.  Follow Up Recommendations  Skilled nursing-short term rehab (<3 hours/day)     Assistance Recommended at Discharge Intermittent Supervision/Assistance  Patient can return home with the following A lot of help with walking and/or  transfers;Assistance with cooking/housework;Assist for transportation;Help with stairs or ramp for entrance;A lot of help with bathing/dressing/bathroom   Equipment Recommendations  None recommended by PT (RW already delivered to her room)    Recommendations for Other Services       Precautions / Restrictions Precautions Precautions: Fall;Other (comment) Precaution Comments: seizure Required Braces or Orthoses: Other Brace Other Brace: uses R AFO when walking; not present this admission. pt reports no one has brought it Restrictions Weight Bearing Restrictions: No     Mobility  Bed Mobility               General bed mobility comments: pt received up in recliner    Transfers Overall transfer level: Needs assistance Equipment used: Rolling walker (2 wheels) Transfers: Sit to/from Stand Sit to Stand: Min assist           General transfer comment: increased assist to rise from chair due to pain; x2 reps from chair    Ambulation/Gait Ambulation/Gait assistance: Min guard Gait Distance (Feet): 20 Feet Assistive device: Rolling walker (2 wheels) Gait Pattern/deviations: Decreased stride length, Decreased dorsiflexion - right, Knee flexed in stance - left, Knee flexed in stance - right, Trunk flexed Gait velocity: decreased     General Gait Details: heavy use of RW. R foot drop at baseline demoing steppage gait pattern. Cues for posture and RW proximity   Stairs Stairs: Yes Stairs assistance: Min assist Stair Management: Two rails, Step to pattern, Forwards, Backwards Number of Stairs: 4 General stair comments: forward ascending, backward descending with cues for sequencing/pain reduction, minA consistently for lift assist/stability     Balance Overall balance assessment: Needs assistance         Standing balance support: Bilateral upper extremity supported,  During functional activity, Reliant on assistive device for balance Standing balance-Leahy Scale:  Poor Standing balance comment: heavy reliance on RW                            Cognition Arousal/Alertness: Awake/alert Behavior During Therapy: WFL for tasks assessed/performed Overall Cognitive Status: Within Functional Limits for tasks assessed                                 General Comments: eager to progress mobility but pain limited, receptive to need for further therapies prior to home        Exercises Other Exercises Other Exercises: supine BUE AROM chest press x10 reps for improved hemodynamics prior to standing    General Comments General comments (skin integrity, edema, etc.): BP 138/94 (105) reclined; BP 108/94 standing at RW, dizzy; BP 143/84 reclined in chair after standing; SpO2/HR [facility]      Pertinent Vitals/Pain Pain Assessment Pain Assessment: 0-10 Pain Score: 8  Pain Location: knees Pain Descriptors / Indicators: Sore Pain Intervention(s): Monitored during session, Limited activity within patient's tolerance, Repositioned, Ice applied           PT Goals (current goals can now be found in the care plan section) Acute Rehab PT Goals Patient Stated Goal: independent with less B knee pain and able to take care of my dog PT Goal Formulation: With patient Time For Goal Achievement: 10/03/21 Progress towards PT goals: Progressing toward goals    Frequency    Min 3X/week      PT Plan Discharge plan needs to be updated       AM-PAC PT "6 Clicks" Mobility   Outcome Measure  Help needed turning from your back to your side while in a flat bed without using bedrails?: A Little Help needed moving from lying on your back to sitting on the side of a flat bed without using bedrails?: A Little Help needed moving to and from a bed to a chair (including a wheelchair)?: A Little Help needed standing up from a chair using your arms (e.g., wheelchair or bedside chair)?: A Little Help needed to walk in hospital room?: A Lot (mod cues for  safety/pacing) Help needed climbing 3-5 steps with a railing? : A Lot (mod cues for safety/pacing) 6 Click Score: 16    End of Session Equipment Utilized During Treatment: Gait belt Activity Tolerance: Patient limited by pain;Treatment limited secondary to medical complications (Comment) (symptomatic orthostatic hypotension) Patient left: in chair;with call bell/phone within reach;with chair alarm set;Other (comment) (ice to B knees, pt aware to remove in 30 mins on/off) Nurse Communication: Mobility status;Other (comment) (orthostatic hypotension) PT Visit Diagnosis: Other abnormalities of gait and mobility (R26.89);Pain Pain - Right/Left:  (R>L) Pain - part of body: Knee     Time: 7616-0737 PT Time Calculation (min) (ACUTE ONLY): 28 min  Charges:  $Gait Training: 8-22 mins $Therapeutic Activity: 8-22 mins                     Madalina Rosman P., PTA Acute Rehabilitation Services Secure Chat Preferred 9a-5:30pm Office: [place] 09/26/2021, 2:05 PM

## 2021-09-26 NOTE — Progress Notes (Signed)
Daily Progress Note Intern Pager: [phone]  Patient name: [name] Medical record number: [id number] Date of birth: 22-Sep-1970 Age: 51 y.o. Gender: female  Primary Care Provider: Jacelyn Grip, MD Consultants: Nephro, neuro Code Status: Full  Pt Overview and Major Events to Date:  6/27 - admitted 6/29 - AKI resolved 7/2 - hypotension resolved  Assessment and Plan:  [name] is a 51 y.o. female who presented with prerenal AKI and rhabdomyolysis, both of which are improved. She is medically stable, but has been denied CIR placement. She is opting for heading home once she has walker delivered. Pertinent PMH/PSH includes T2DM, hypotension, chronic pain syndrome, seizure disorder, and hypothyroidism.   Hypotension BP has been within acceptable limits over last couple of days. Likely 2/2 decreased PO intake given improvement with increased PO in hospital. Given second episode of flashing lights in vision and dizziness with standing, will observe tonight (7/5). -Midodrine 10 mg 3 times daily with meals -Follow-up aldosterone/renin activity ratio -Continue vitals per floor protocol  -Orthostatic vitals -Will decrease cymbalta to 60 mg daily, gabapentin to QHS, and insulin regimen to 70/30 40u to reduce hypotension/hypoglycemia risk and further falls -Connect with SW concerning SNF placement given continued dizziness, leg pain affecting mobility  Poorly controlled type 2 diabetes mellitus (HCC) Glucose stable in 200s over last couple of days. - Continue 32u BID  - Continue CBGs, rSSI (11u aspart last 24 hours), carb-modified diet   Chronic pain Pain from severe OA stable on med regimen. -Continue Lidocaine patch -Gabapentin '300mg'$  QHS -PT/OT following -Tylenol 650 mg every 6 hours -Continue home oxy as needed -Fentanyl patch 50 mcg every 72 hours -Bowel regimen: continue senna  Hypomagnesemia Mg repleted to 2.0. -Follow  clinically  Anxiety Stable. -Continue Cymbalta '60mg'$  BID -Continue added Buspar 7.5 mg BID  Seizure disorder (HCC) No seizure activity this hospitalization. Stable on current regimen. -Continue Keppra 750 mg BID   Loss of consciousness (HCC)-resolved as of 09/22/2021 Reported 4 episodes of loss of consciousness in the 2 weeks prior to admission. Workup with orthostatics, Echo and EEG unremarkable. No further episodes since hospitalization. Consider hypoglycemia and hypotension as cause of loss of consciousness.    Urinary retention-resolved as of 09/22/2021 Required two I/O catheterizations for urinary retention but has since been voiding spontaneously without retention.   FEN/GI: Carb modified PPx: Lovenox Dispo: Due to denied CIR, will assess SNF for placement.  Subjective:  [name] is feeling well this morning, though she did have another dizzy episode last night with bright flashing lights in her vision when she went to stand. She would like to stay again today to ensure she is okay, and she would prefer to go to a SNF for further rehabilitation.  Objective: Temp:  [98.2 F (36.8 C)-98.5 F (36.9 C)] 98.2 F (36.8 C) (07/05 0814) Pulse Rate:  [65-82] 80 (07/05 0814) Resp:  [16-20] 16 (07/05 0814) BP: (102-126)/(61-78) 102/61 (07/05 0814) SpO2:  [92 %-94 %] 94 % (07/05 0814) Physical Exam: General: Lying in bed, conversant, in NAD Cardiovascular: Regular rate Respiratory: Breathing and speaking comfortably on RA Abdomen: Non-distended Extremities: Moves extremities grossly equally   Laboratory: Most recent CBC Lab Results  Component Value Date   WBC 11.8 (H) 09/19/2021   HGB 12.8 09/19/2021   HCT 37.9 09/19/2021   MCV 93.3 09/19/2021   PLT 193 09/19/2021   Most recent BMP    Latest Ref Rng & Units 09/23/2021    4:01 AM  BMP  Glucose 70 - 99 mg/dL 213   BUN 6 - 20 mg/dL 13   Creatinine 0.44 - 1.00 mg/dL 0.89   Sodium 135 - 145 mmol/L 140   Potassium 3.5 -  5.1 mmol/L 4.2   Chloride 98 - 111 mmol/L 104   CO2 22 - 32 mmol/L 27   Calcium 8.9 - 10.3 mg/dL 9.2    Mg 2.0  Ethelene Hal, MD 09/26/2021, 12:05 PM PGY-1, [place] Intern pager: [phone], text pages welcome Secure chat group [place]

## 2021-09-27 ENCOUNTER — Ambulatory Visit: Payer: Medicare HMO | Admitting: Orthopaedic Surgery

## 2021-09-27 ENCOUNTER — Ambulatory Visit: Payer: Medicare HMO | Admitting: Family Medicine

## 2021-09-27 DIAGNOSIS — I959 Hypotension, unspecified: Secondary | ICD-10-CM | POA: Diagnosis not present

## 2021-09-27 DIAGNOSIS — R300 Dysuria: Secondary | ICD-10-CM

## 2021-09-27 LAB — URINALYSIS, ROUTINE W REFLEX MICROSCOPIC
Bilirubin Urine: NEGATIVE
Glucose, UA: NEGATIVE mg/dL
Ketones, ur: NEGATIVE mg/dL
Nitrite: POSITIVE — AB
Protein, ur: 30 mg/dL — AB
RBC / HPF: >50 RBC/hpf — ABNORMAL HIGH (ref 0–5)
Specific Gravity, Urine: 1.016 (ref 1.005–1.030)
WBC, UA: >50 WBC/hpf — ABNORMAL HIGH (ref 0–5)
pH: 5 (ref 5.0–8.0)

## 2021-09-27 LAB — GLUCOSE, CAPILLARY
Glucose-Capillary: 191 mg/dL — ABNORMAL HIGH (ref 70–99)
Glucose-Capillary: 261 mg/dL — ABNORMAL HIGH (ref 70–99)
Glucose-Capillary: 276 mg/dL — ABNORMAL HIGH (ref 70–99)
Glucose-Capillary: 190 mg/dL — ABNORMAL HIGH (ref 70–99)

## 2021-09-27 MED ORDER — CEPHALEXIN 500 MG PO CAPS
500.0000 mg | ORAL_CAPSULE | Freq: Two times a day (BID) | ORAL | Status: DC
  Administered 2021-09-27: 500 mg via ORAL
  Filled 2021-09-27: qty 1

## 2021-09-27 MED ORDER — HYDROXYZINE HCL 25 MG PO TABS
25.0000 mg | ORAL_TABLET | Freq: Every day | ORAL | Status: AC
  Administered 2021-09-27: 25 mg via ORAL
  Filled 2021-09-27: qty 1

## 2021-09-27 MED ORDER — OXYCODONE HCL 5 MG PO TABS
5.0000 mg | ORAL_TABLET | Freq: Four times a day (QID) | ORAL | Status: DC | PRN
  Administered 2021-09-27 – 2021-09-28 (×3): 5 mg via ORAL
  Filled 2021-09-27 (×3): qty 1

## 2021-09-27 NOTE — Assessment & Plan Note (Addendum)
UA demonstrated large Hgb, 30 protein, positive nitrites, large leukocytes, >50 RBC/WBC, and many bacteria. -F/u culture results -Change keflex 500 mg BID > cefadroxil 500 mg BID given easier dosing since keflex ideally TID (last dose 7/14)

## 2021-09-27 NOTE — Progress Notes (Signed)
Nutrition Follow-up  DOCUMENTATION CODES:   Obesity unspecified  INTERVENTION:   Multivitamin w/ minerals daily Encourage good PO intake Diet education  NUTRITION DIAGNOSIS:   Inadequate oral intake related to decreased appetite as evidenced by per patient/family report. - Progressing  GOAL:   Patient will meet greater than or equal to 90% of their needs - Progressing   MONITOR:   PO intake, Labs, I & O's  REASON FOR ASSESSMENT:   Consult Assessment of nutrition requirement/status, Diet education  ASSESSMENT:   51 y.o. female presented to the ED with knee pain after a fall. PMH includes HTN, T2DM, chronic pain syndrome, and seizure disorders. Pt admitted with AKI.   Pt decided to go to a SNF at discharge; team currently working on placement.  Pt resting in bed at time of RD visit; pt lunch on bedside table. Pt reports that her appetite has improved and is doing better. Reports that she is eating ~75% of her meals. Reports some nausea 2/2 to pain in her bladder and causing her not to eat too much.  Per EMR, pt PO intake is recorded have been 100%.   Pt noted to not have a BM x4 days, may need to consider increase bowel regimen.  RD to add "Plate Method for Diabetics" handout to discharge instructions.   Medications reviewed and include: NovoLog, MVI, Senokot Labs reviewed: 24 hr CBG [id number]  Diet Order:   Diet Order             Diet Carb Modified Fluid consistency: Thin; Room service appropriate? No  Diet effective now                   EDUCATION NEEDS:   Education needs have been addressed  Skin:  Skin Assessment: Reviewed RN Assessment  Last BM:  7/2  Height:   Ht Readings from Last 1 Encounters:  09/18/21 '5\' 6"'$  (1.676 m)    Weight:   Wt Readings from Last 1 Encounters:  09/24/21 129.8 kg    Ideal Body Weight:  59.1 kg  BMI:  Body mass index is 46.19 kg/m.  Estimated Nutritional Needs:   Kcal:  2200-2400  Protein:  110-125  grams  Fluid:  >/= 2 L    Hermina Barters RD, LDN Clinical Dietitian See [facility] for contact information.

## 2021-09-27 NOTE — Progress Notes (Signed)
Daily Progress Note Intern Pager: [phone]  Patient name: [name] Medical record number: [id number] Date of birth: 01-20-1971 Age: 51 y.o. Gender: female  Primary Care Provider: Jacelyn Grip, MD Consultants: Nephro, neuro Code Status: Full   Pt Overview and Major Events to Date:  6/27 - admitted 6/29 - AKI resolved 7/2 - hypotension resolved  Assessment and Plan:  [name] is a 51 y.o. female who presented with prerenal AKI and rhabdomyolysis, both of which are improved. She is medically stable and awaiting SNF placement. Pertinent PMH/PSH includes T2DM, hypotension, chronic pain syndrome, seizure disorder, and hypothyroidism.    Hypotension BP has been within acceptable limits. Given second episode of flashing lights in vision and dizziness with standing 7/5 and chronic knee pain, patient prefers SNF placement for further rehabilitation before heading home. Orthostatics yesterday normal but abnormal on PT's assessment. -Midodrine 10 mg 3 times daily with meals -Follow-up aldosterone/renin activity ratio -F/u TOC for placement -Continue decreased cymbalta to 60 mg daily, gabapentin to QHS, and insulin regimen to 70/30 40u to reduce hypotension/hypoglycemia risk and further falls on d/c  Poorly controlled type 2 diabetes mellitus (HCC) Glucose stable in 200s overall last couple days. - Continue 32u BID  - Continue CBGs, rSSI, carb-modified diet - Alter insulin regimen as above as d/c with close BG follow up PCP   Chronic pain Pain from severe OA stable on med regimen. She was supposed to see Dr. Ninfa [name] for knee injections today. -Continue Lidocaine patch -Gabapentin '300mg'$  QHS -PT/OT following, attempt to walk and mobilize -Tylenol 650 mg every 6 hours -Decrease home oxy to 5 mg -Fentanyl patch 50 mcg every 72 hours -Bowel regimen: continue senna  Dysuria She has had dysuria on and off since admission. UA order placed. -F/u UA  results  Hypomagnesemia Mg repleted to 2.0. -Follow clinically  Anxiety Stable. -Continue Cymbalta '60mg'$  BID -Continue added Buspar 7.5 mg BID  Seizure disorder (HCC) No seizure activity this hospitalization. Stable on current regimen. -Continue Keppra 750 mg BID   Loss of consciousness (HCC)-resolved as of 09/22/2021 Reported 4 episodes of loss of consciousness in the 2 weeks prior to admission. Workup with orthostatics, Echo and EEG unremarkable. No further episodes since hospitalization. Consider hypoglycemia and hypotension as cause of loss of consciousness.    Urinary retention-resolved as of 09/22/2021 Required two I/O catheterizations for urinary retention but has since been voiding spontaneously without retention.   FEN/GI: Carb modified PPx: Lovenox Dispo: Due to denied CIR, will assess SNF for placement.  Subjective:  [name] is doing well this morning overall. She has not been sleeping well. She is concerned about her knee injections and getting those done. She also has had dysuria that has come and gone.  Objective: Temp:  [98.3 F (36.8 C)-99.9 F (37.7 C)] 98.3 F (36.8 C) (07/06 0810) Pulse Rate:  [73-77] 73 (07/06 0810) Resp:  [15-18] 15 (07/06 0810) BP: (94-133)/(66-75) 99/67 (07/06 0810) SpO2:  [91 %-96 %] 96 % (07/06 0810) Physical Exam: General: Lying in bed, in NAD Cardiovascular: Regular rate Respiratory: Breathing and speaking comfortably on RA Abdomen: Soft, non-tender in suprapubic area, normoactive bowel sounds Extremities: Moves all extremities equally  Laboratory: Most recent CBC Lab Results  Component Value Date   WBC 11.8 (H) 09/19/2021   HGB 12.8 09/19/2021   HCT 37.9 09/19/2021   MCV 93.3 09/19/2021   PLT 193 09/19/2021   Most recent BMP    Latest Ref Rng & Units 09/23/2021  4:01 AM  BMP  Glucose 70 - 99 mg/dL 213   BUN 6 - 20 mg/dL 13   Creatinine 0.44 - 1.00 mg/dL 0.89   Sodium 135 - 145 mmol/L 140   Potassium 3.5 - 5.1  mmol/L 4.2   Chloride 98 - 111 mmol/L 104   CO2 22 - 32 mmol/L 27   Calcium 8.9 - 10.3 mg/dL 9.2    Heather Hal, MD 09/27/2021, 12:40 PM PGY-1, [place] Intern pager: [phone], text pages welcome Secure chat group [place]

## 2021-09-27 NOTE — NC FL2 (Signed)
[place] LEVEL OF CARE SCREENING TOOL     IDENTIFICATION  Patient Name: [name] Birthdate: May 19, 1970 Sex: female Admission Date (Current Location): 09/18/2021  [facility] and [place] Number:  Herbalist and Address:  [facility], [place] [address] [place],  [address]      Provider Number: [id number]  Attending Physician Name and Address:  Lind Covert, MD  Relative Name and Phone Number:       Current Level of Care: Hospital Recommended Level of Care: [place] Prior Approval Number:    Date Approved/Denied:   PASRR Number: [id number]  Discharge Plan: SNF    Current Diagnoses: Patient Active Problem List   Diagnosis Date Noted   Hypomagnesemia 09/22/2021   Anxiety 09/21/2021   Hypotension 09/20/2021   Rhabdomyolysis 09/18/2021   Physical deconditioning 05/16/2021   Hyponatremia 05/16/2021   Hyperkalemia 05/16/2021   Chronically on opiate therapy 05/14/2021   Seizure disorder ([place]) 03/07/2020   Depression with anxiety 18/84/1660   Umbilical hernia, incarcerated, s/p repair 10/16/2017 10/16/2017   Chronic pain of both knees 04/23/2017   Unilateral primary osteoarthritis, left knee 10/24/2016   Unilateral primary osteoarthritis, right knee 10/24/2016   Abnormal mammogram with microcalcification-Right inner lower quadrant 07/21/2013   Constipation 03/02/2011   Heel ulcer due to DM ([place]) 02/19/2011   Wound, open, hip or thigh 02/19/2011   Healing pressure ulcer stage III ([place]) 02/18/2011   Morbidly obese ([place]) 02/18/2011   Poorly controlled type 2 diabetes mellitus ([place]) 02/18/2011   Sleep apnea 02/18/2011   Hypothyroidism 02/18/2011   Chronic pain 02/18/2011    Orientation RESPIRATION BLADDER Height & Weight     Self, Time, Situation, Place  Normal Continent Weight: 286 lb 2.5 oz (129.8 kg) Height:  '5\' 6"'$  (167.6 cm)  BEHAVIORAL SYMPTOMS/MOOD NEUROLOGICAL BOWEL NUTRITION  STATUS      Continent Diet (See dc summary)  AMBULATORY STATUS COMMUNICATION OF NEEDS Skin   Limited Assist Verbally Normal                       Personal Care Assistance Level of Assistance  Bathing, Feeding, Dressing Bathing Assistance: Limited assistance Feeding assistance: Independent Dressing Assistance: Limited assistance     Functional Limitations Info             SPECIAL CARE FACTORS FREQUENCY  PT (By licensed PT), OT (By licensed OT)     PT Frequency: 5x/week OT Frequency: 5x/week            Contractures Contractures Info: Not present    Additional Factors Info  Code Status, Allergies, Psychotropic Code Status Info: Full Allergies Info: Penicillins, Clindamycin/lincomycin, Nsaids, Sulfa Antibiotics, Dulaglutide, No [employer] Food Allergies, Versed (Midazolam) Psychotropic Info: See dc summary         Current Medications (09/27/2021):  This is the current hospital active medication list Current Facility-Administered Medications  Medication Dose Route Frequency Provider Last Rate Last Admin   acetaminophen (TYLENOL) tablet 650 mg  650 mg Oral Q6H Espinoza, Alejandra, DO   650 mg at 09/27/21 0541   aspirin EC tablet 81 mg  81 mg Oral Daily Precious Gilding, DO   81 mg at 09/26/21 0847   atorvastatin (LIPITOR) tablet 20 mg  20 mg Oral Daily Precious Gilding, DO   20 mg at 09/26/21 0847   busPIRone (BUSPAR) tablet 7.5 mg  7.5 mg Oral BID Orvis Brill, DO   7.5 mg at 09/26/21 2141  DULoxetine (CYMBALTA) DR capsule 60 mg  60 mg Oral Daily Dameron, Marisa, DO       enoxaparin (LOVENOX) injection 65 mg  65 mg Subcutaneous Q24H Lenoria Chime, MD   65 mg at 09/26/21 0851   fentaNYL (DURAGESIC) 50 MCG/HR 1 patch  1 patch Transdermal Q72H Alcus Dad, MD   1 patch at 09/26/21 1305   gabapentin (NEURONTIN) capsule 300 mg  300 mg Oral QHS Alen Bleacher, MD   300 mg at 09/26/21 2142   insulin aspart (novoLOG) injection 0-20 Units  0-20 Units Subcutaneous TID WC  Precious Gilding, DO   11 Units at 09/26/21 1650   insulin aspart (novoLOG) injection 0-5 Units  0-5 Units Subcutaneous QHS Precious Gilding, DO   5 Units at 09/26/21 2141   insulin aspart protamine- aspart (NOVOLOG MIX 70/30) injection 32 Units  32 Units Subcutaneous BID WC Espinoza, Alejandra, DO   32 Units at 09/26/21 1655   levETIRAcetam (KEPPRA) tablet 750 mg  750 mg Oral Q12H Kerney Elbe, MD   750 mg at 09/26/21 2142   levothyroxine (SYNTHROID) tablet 137 mcg  137 mcg Oral Q0600 Rosezetta Schlatter, MD   137 mcg at 09/27/21 0541   lidocaine (LIDODERM) 5 % 2 patch  2 patch Transdermal Q24H Sharion Settler, DO   2 patch at 09/27/21 0540   midodrine (PROAMATINE) tablet 10 mg  10 mg Oral TID WC Dameron, Luna Fuse, DO   10 mg at 09/26/21 1741   multivitamin with minerals tablet 1 tablet  1 tablet Oral Daily Lenoria Chime, MD   1 tablet at 09/26/21 6237   Oral care mouth rinse  15 mL Mouth Rinse PRN Lenoria Chime, MD       oxyCODONE (Oxy IR/ROXICODONE) immediate release tablet 10 mg  10 mg Oral Q6H PRN Rosezetta Schlatter, MD   10 mg at 09/27/21 0009   pantoprazole (PROTONIX) EC tablet 80 mg  80 mg Oral Q1200 Precious Gilding, DO   80 mg at 09/26/21 1301   senna-docusate (Senokot-S) tablet 1 tablet  1 tablet Oral BID Sharion Settler, DO   1 tablet at 09/26/21 2142     Discharge Medications: Please see discharge summary for a list of discharge medications.  Relevant Imaging Results:  Relevant Lab Results:   Additional Information SSN: [id number]. PFIZER Comrnaty(Gray TOP) Covid-19 Vaccine 06/19/2019 , 05/29/2019  Pfizer COVID-19 Vaccine 06/19/2019 , 05/29/2019  Benard Halsted, LCSW

## 2021-09-27 NOTE — Inpatient Diabetes Management (Signed)
Inpatient Diabetes Program Recommendations  AACE/ADA: New Consensus Statement on Inpatient Glycemic Control (2015)  Target Ranges:  Prepandial:   less than 140 mg/dL      Peak postprandial:   less than 180 mg/dL (1-2 hours)      Critically ill patients:  140 - 180 mg/dL   Lab Results  Component Value Date   GLUCAP 191 (H) 09/27/2021   HGBA1C 7.9 (H) 09/19/2021    Review of Glycemic Control  Latest Reference Range & Units 09/26/21 08:13 09/26/21 12:25 09/26/21 15:52 09/26/21 20:14 09/27/21 08:14  Glucose-Capillary 70 - 99 mg/dL 218 (H) 202 (H) 275 (H) 372 (H) 191 (H)  (H): Data is abnormally high  Diabetes history: DM2 Outpatient Diabetes medications: 70/30 75 units TID Current orders for Inpatient glycemic control: 70/30 32 units BID, Novolog 0-20 units TID & 0-5 units QHS  Inpatient Diabetes Program Recommendations:    70/30 36 units BID  Will continue to follow while inpatient.  Thank you, Reche Dixon, MSN, [place] Diabetes Coordinator Inpatient Diabetes Program [phone] (team pager from 8a-5p)

## 2021-09-27 NOTE — TOC Progression Note (Addendum)
Transition of Care [facility]) - Progression Note    Patient Details  Name: [name] MRN: [id number] Date of Birth: 1970/10/02  Transition of Care [facility]) CM/SW [place], LCSW Phone Number: 09/27/2021, 8:38 AM  Clinical Narrative:    CSW submitted clinicals to insurance via Nav[employer] portal pending a SNF choice, Ref# [id number].  CSW provided SNF bed offers to patient. She stated she would like to discuss with her brother and call CSW back.    Expected Discharge Plan: [place] Barriers to Discharge: Barriers Resolved  Expected Discharge Plan and Services Expected Discharge Plan: [place] In-house Referral: Clinical Social Work Discharge Planning Services: CM Consult Post Acute Care Choice: [place] arrangements for the past 2 months: Single Family Home Expected Discharge Date: 09/25/21               DME Arranged: Gilford Rile rolling DME Agency: [employer] Date DME Agency Contacted: 09/25/21 Time DME Agency Contacted: 1300 Representative spoke with at DME Agency: [place]: PT [place]: [facility] Date [place]: 09/25/21 Time [place]: [id number] Representative spoke with at [place]: [place] ([place]) Interventions    Readmission Risk Interventions    09/25/2021    1:28 PM  Readmission Risk Prevention Plan  Transportation Screening Complete  PCP or Specialist Appt within 5-7 Days Complete  Home Care Screening Complete  Medication Review (RN CM) Complete

## 2021-09-28 ENCOUNTER — Ambulatory Visit: Payer: Medicare HMO | Admitting: Family Medicine

## 2021-09-28 ENCOUNTER — Other Ambulatory Visit (HOSPITAL_COMMUNITY): Payer: Self-pay

## 2021-09-28 DIAGNOSIS — I959 Hypotension, unspecified: Secondary | ICD-10-CM | POA: Diagnosis not present

## 2021-09-28 LAB — GLUCOSE, CAPILLARY
Glucose-Capillary: 215 mg/dL — ABNORMAL HIGH (ref 70–99)
Glucose-Capillary: 202 mg/dL — ABNORMAL HIGH (ref 70–99)
Glucose-Capillary: 246 mg/dL — ABNORMAL HIGH (ref 70–99)

## 2021-09-28 MED ORDER — CEFADROXIL 500 MG PO CAPS
500.0000 mg | ORAL_CAPSULE | Freq: Two times a day (BID) | ORAL | Status: DC
Start: 2021-09-28 — End: 2021-09-29
  Administered 2021-09-28 (×2): 500 mg via ORAL
  Filled 2021-09-28 (×2): qty 1

## 2021-09-28 MED ORDER — CEFADROXIL 500 MG PO CAPS
500.0000 mg | ORAL_CAPSULE | Freq: Two times a day (BID) | ORAL | 0 refills | Status: AC
  Filled 2021-09-28: qty 12, 6d supply, fill #0

## 2021-09-28 MED ORDER — OXYCODONE HCL 5 MG PO TABS
10.0000 mg | ORAL_TABLET | Freq: Once | ORAL | Status: AC
  Administered 2021-09-28: 10 mg via ORAL
  Filled 2021-09-28: qty 2

## 2021-09-28 MED ORDER — POLYETHYLENE GLYCOL 3350 17 G PO PACK
17.0000 g | PACK | Freq: Two times a day (BID) | ORAL | Status: DC
  Administered 2021-09-28: 17 g via ORAL
  Filled 2021-09-28 (×2): qty 1

## 2021-09-28 MED ORDER — INSULIN ASPART PROT & ASPART (70-30 MIX) 100 UNIT/ML ~~LOC~~ SUSP
40.0000 [IU] | Freq: Two times a day (BID) | SUBCUTANEOUS | Status: DC
Start: 2021-09-28 — End: 2021-09-29
  Administered 2021-09-28 (×2): 40 [IU] via SUBCUTANEOUS

## 2021-09-28 NOTE — Progress Notes (Signed)
OT Cancellation Note  Patient Details Name: [name] MRN: [id number] DOB: 1970-07-01   Cancelled Treatment:    Reason Eval/Treat Not Completed: Fatigue/lethargy limiting ability to participate: Pt stating that she is tired and would rather rest in anticipation of brother picking pt up at 3:00 for home today. Discussed home safety, 2-3 calls a day from family to ensure wellness, having charged cell phone on her at all times when up, consideration of medical alert button, and having family present during showers as well as a shower chair to consider.  Pt listened attentively and agreed to follow through with all possible recommendations.  6 min in room-non-billed.   Julien Girt 09/28/2021, 1:44 PM

## 2021-09-28 NOTE — TOC Progression Note (Signed)
Transition of Care [facility]) - Progression Note    Patient Details  Name: [name] MRN: [id number] Date of Birth: 1971-02-20  Transition of Care [facility]) CM/SW [place], [place] Work Phone Number: 09/28/2021, 1:24 PM  Clinical Narrative:    MSW intern spoke with patient at bedside regarding the discharge plan. Patient stated she was thinking about going to a SNF, but has now decided she wants to go home due to her doctor cutting her pain medications in half from '10mg'$  to '5mg'$ . Patient states she feels as if she cannot tolerate the pain that will come from rehab if her medications are lowered and has chosen to return to her home. MSW intern asked patient if she had any family in the area or a friend that was able to come and check on her. Patient stated she does not, but feels comfortable returning home due to the doctor ordering home health. MSW intern asked the patient if she was able to bathe, cook or dress by herself to which she stated no. MSW intern asked the patient if she was able to bathe, cook or dress by herself to which she stated no. MSW intern then asked about equipment at the home. Patient stated and showed a rolling walker in her room with her and stated she also has a bedside commode. However, she plans to use the restroom and not the bedside commode due to not having a way to empty it. Patient opened up to MSW intern and stated she has been on pain medications for years and knows when to lower them and when not to. Patient stated she does not feel as if she has a problem with taking too many pain medications, but does feel as if she is needing them due to her pain levels being so high constantly.   Expected Discharge Plan: Skilled Nursing Facility Barriers to Discharge: Insurance Authorization  Expected Discharge Plan and Services Expected Discharge Plan: [place] In-house Referral: Clinical Social Work Discharge Planning Services: CM  Consult Post Acute Care Choice: [place] arrangements for the past 2 months: Single Family Home Expected Discharge Date: 09/28/21               DME Arranged: Gilford Rile rolling DME Agency: [employer] Date DME Agency Contacted: 09/25/21 Time DME Agency Contacted: 1300 Representative spoke with at DME Agency: [place]: PT [place]: [facility] Date [place]: 09/25/21 Time [place]: 7001 Representative spoke with at [place]: [place] ([place]) Interventions    Readmission Risk Interventions    09/25/2021    1:28 PM  Readmission Risk Prevention Plan  Transportation Screening Complete  PCP or Specialist Appt within 5-7 Days Complete  Home Care Screening Complete  Medication Review (RN CM) Complete

## 2021-09-28 NOTE — Progress Notes (Signed)
Daily Progress Note Intern Pager: [phone]  Patient name: [name] Medical record number: [id number] Date of birth: [birth year] Age: 51 y.o. Gender: female  Primary Care Provider: Jacelyn Grip, MD Consultants: Nephro, neuro Code Status: Full  Pt Overview and Major Events to Date:  6/27 - admitted 6/29 - AKI resolved 7/2 - hypotension resolved  Assessment and Plan:  [name] is a 51 y.o. female who presented with prerenal AKI and rhabdomyolysis, both of which are improved. She is medically stable and is making the decision of SNF vs home. Pertinent PMH/PSH includes T2DM, hypotension, chronic pain syndrome, seizure disorder, and hypothyroidism.   Hypotension BP has been within acceptable limits, though orthostatics have occasionally been abnormal. Suspect cause of hypotension and falls 2/2 medication side effects and decreased PO intake with dieting. SNF offers given to patient, and she is coordinating with TOC. -Midodrine 10 mg 3 times daily with meals -Follow-up aldosterone/renin activity ratio -F/u TOC for placement, though she may just go home -Continue oxy, cymbalta to 60 mg daily, gabapentin to QHS, and insulin regimen to 70/30 40u to reduce hypotension/hypoglycemia risk and further falls on d/c  Poorly controlled type 2 diabetes mellitus (HCC) Glucose stable in 200s overall last couple days. - Continue 40u BID 70/30 insulin - Continue CBGs, rSSI, carb-modified diet while in hospital   Chronic pain Pain from severe OA stable on med regimen. She was supposed to see Dr. Ninfa [place] for knee injections today. -Continue Lidocaine patch -Gabapentin '300mg'$  QHS -PT/OT following, attempt to walk and mobilize -Tylenol 650 mg every 6 hours -Decrease home oxy to 5 mg -Fentanyl patch 50 mcg every 72 hours -Bowel regimen: continue senna BID and add on miralax BID  Dysuria UA demonstrated large Hgb, 30 protein, positive nitrites, large leukocytes, >50 RBC/WBC, and  many bacteria. -F/u culture results -Change keflex 500 mg BID > cefadroxil 500 mg BID given easier dosing since keflex ideally TID (last dose 7/14)  Hypomagnesemia Mg repleted to 2.0. -Follow clinically  Anxiety Stable. -Continue Cymbalta '60mg'$  BID -Continue added Buspar 7.5 mg BID  Seizure disorder (HCC) No seizure activity this hospitalization. Stable on current regimen. -Continue Keppra 750 mg BID   Loss of consciousness (HCC)-resolved as of 09/22/2021 Reported 4 episodes of loss of consciousness in the 2 weeks prior to admission. Workup with orthostatics, Echo and EEG unremarkable. No further episodes since hospitalization. Consider hypoglycemia and hypotension as cause of loss of consciousness.    Urinary retention-resolved as of 09/22/2021 Required two I/O catheterizations for urinary retention but has since been voiding spontaneously without retention.   FEN/GI: Carb modified PPx: Lovenox Dispo: Due to denied CIR, will assess SNF vs home  Subjective:  [name] is doing well this morning. She would like to continue her home pain regimen given it helps her chronic pain the best and does not feel it has contributed to her presentation. She is also curious about knee injections here in the hospital.  Objective: Temp:  [97.6 F (36.4 C)-98.3 F (36.8 C)] 98.3 F (36.8 C) (07/07 0814) Pulse Rate:  [55-84] 84 (07/07 0814) Resp:  [16-20] 18 (07/07 0814) BP: (93-147)/(59-72) 93/69 (07/07 0814) SpO2:  [93 %-97 %] 93 % (07/07 0814) Weight:  [129.7 kg] 129.7 kg (07/07 0100) Physical Exam: General: Lying in bed, conversant, in NAD Cardiovascular: RRR, no m/r/g Respiratory: CTAB anteriorly Abdomen: Soft, non-distended, non-tender Extremities: Moves all extremities grossly equally though has knee pain  Laboratory: Most recent CBC Lab Results  Component Value  Date   WBC 11.8 (H) 09/19/2021   HGB 12.8 09/19/2021   HCT 37.9 09/19/2021   MCV 93.3 09/19/2021   PLT 193  09/19/2021   Most recent BMP    Latest Ref Rng & Units 09/23/2021    4:01 AM  BMP  Glucose 70 - 99 mg/dL 213   BUN 6 - 20 mg/dL 13   Creatinine 0.44 - 1.00 mg/dL 0.89   Sodium 135 - 145 mmol/L 140   Potassium 3.5 - 5.1 mmol/L 4.2   Chloride 98 - 111 mmol/L 104   CO2 22 - 32 mmol/L 27   Calcium 8.9 - 10.3 mg/dL 9.2     Heather Hal, MD 09/28/2021, 9:17 AM PGY-1, [place] Intern pager: [phone], text pages welcome Secure chat group [place]

## 2021-09-28 NOTE — Progress Notes (Signed)
Pt waiting on meds she is to take at home. They were to be delivered earlier today. Pt waiting on Buspar and Midodrine. [place] called but is closed. Hospital pharmacy notified and they are checking to see if [place] delivered meds to them.

## 2021-09-28 NOTE — TOC Progression Note (Signed)
Transition of Care [facility]) - Progression Note    Patient Details  Name: [name] MRN: [id number] Date of Birth: 1970/06/07  Transition of Care [facility]) CM/SW [place], RN Phone Number: 09/28/2021, 11:49 AM  Clinical Narrative:     Patient is now requesting that she go home with home health, due to her pain medication being lowered. She does not feel she can do intensive exercising with limited pain medication. Chat with MD to let them know her feelings. Noted that she was set up with [facility] on 7/4 Reconfirmed that [facility] can still service patient.   Expected Discharge Plan: Skilled Nursing Facility Barriers to Discharge: Insurance Authorization  Expected Discharge Plan and Services Expected Discharge Plan: [place] In-house Referral: Clinical Social Work Discharge Planning Services: CM Consult Post Acute Care Choice: [place] arrangements for the past 2 months: Single Family Home Expected Discharge Date: 09/25/21               DME Arranged: Gilford Rile rolling DME Agency: [employer] Date DME Agency Contacted: 09/25/21 Time DME Agency Contacted: 1300 Representative spoke with at DME Agency: [place]: PT [place]: [facility] Date [place]: 09/25/21 Time [place]: 0093 Representative spoke with at [place]: [place] ([place]) Interventions    Readmission Risk Interventions    09/25/2021    1:28 PM  Readmission Risk Prevention Plan  Transportation Screening Complete  PCP or Specialist Appt within 5-7 Days Complete  Home Care Screening Complete  Medication Review (RN CM) Complete

## 2021-09-28 NOTE — Progress Notes (Signed)
Pharmacist states the meds went to [facility] in [place] already and is on the AVS

## 2021-09-28 NOTE — TOC Transition Note (Signed)
Transition of Care [facility]) - CM/SW Discharge Note   Patient Details  Name: [name] MRN: [id number] Date of Birth: Nov 07, 1970  Transition of Care [facility]) CM/SW Contact:  Verdell Carmine, RN Phone Number: 09/28/2021, 1:38 PM   Clinical Narrative:    Patient clarified that she wants home health. Discussed with MD, CSW. Patient upset that her pain medication was decreased. She has had chronic pain for years and states she understands wen to take her medication.  She is set up with [facility], confirmed with Jen L. MD writing orders for [facility] The patient has RW No other needs identified.    Final next level of care: [place] Barriers to Discharge: No Barriers Identified   Patient Goals and CMS Choice Patient states their goals for this hospitalization and ongoing recovery are:: Return home CMS Medicare.gov Compare Post Acute Care list provided to:: Patient Choice offered to / list presented to : Patient  Discharge Placement                       Discharge Plan and Services In-house Referral: Clinical Social Work Discharge Planning Services: CM Consult Post Acute Care Choice: Home Health          DME Arranged: Gilford Rile rolling DME Agency: [employer] Date DME Agency Contacted: 09/25/21 Time DME Agency Contacted: 86 Representative spoke with at DME Agency: [place]: PT [place]: Well [place] Date [place]: 09/25/21 Time [place]: [id number] Representative spoke with at [place]: [place] ([place]) Interventions     Readmission Risk Interventions    09/25/2021    1:28 PM  Readmission Risk Prevention Plan  Transportation Screening Complete  PCP or Specialist Appt within 5-7 Days Complete  Home Care Screening Complete  Medication Review (RN CM) Complete

## 2021-09-28 NOTE — Progress Notes (Signed)
PT Cancellation Note  Patient Details Name: [name] MRN: [id number] DOB: [birth year]   Cancelled Treatment:    Reason Eval/Treat Not Completed: (P) Pain limiting ability to participate (pt awaiting brother to pick her up from hospital and politely defers PT session.) Pt noted to not have TED hose donned, offered to assist her to don as she has had multiple recent episodes of sx BP drop however pt defers and states her brother can assist her with this. Pt reporting plan for [facility], encouraged her to consider SNF and to have MD coordinate injections for knee pain which per Dr Thompson Grayer are due today. Pt reports she will call Dr Trevor Mace office to set up OP appt for this. Offered to review safe mobility again with pt but she defers, wanting to rest.   Carlene Coria 09/28/2021, 4:34 PM

## 2021-09-28 NOTE — Discharge Summary (Cosign Needed Addendum)
[facility] Discharge Summary  Patient name: [name] Medical record number: [id number] Date of birth: 08/02/1970 Age: 51 y.o. Gender: female Date of Admission: 09/18/2021  Date of Discharge: 09/28/2021 Admitting Physician: Kinnie Feil, MD  Primary Care Provider: Jacelyn Grip, MD Consultants: Nephro, neuro  Indication for Hospitalization: prerenal AKI, rhabdomyolysis s/p fall  Brief Hospital Course:  [name] is a 51 y.o. female who p/w with AKI, dehydration, rhabdomyolysis in the setting of syncopal episode leaving her immobilized for 2 days without p.o. intake.  Pre-renal AKI Initial labs showed worseing kidney function with creatinine of 6.47(Baseline ~1.7), BUN 44, GFR of 7.  Nephrology was consulted.  In the ED, was given 1 L LR bolus and started on maintenance IV fluids with significant improvement in creatinine the next day. By discharge, AKI resolved creatinine of 1.35.  Rhabdomyolysis Initial CK of 2597 which improved after receiving maintenance IV fluids.  Creatinine trended downward with creatinine of 621 on day 3.    Loss of Consciousness X-ray imaging in ED showed no acute fractures of BLEs or lumbar spine.  Echocardiogram was ordered which showed LVEF 55-60%, no regional wall motion abnormalities. EEG normal as below. Suspect 2/2 decreased PO intake while on severe diet leading to orthostasis though medication effects could also be contributing. By discharge, she was feeling well. Decreased cymbalta to 60 mg, 70/30 insulin to 40 u with close PCP follow up.  Seizures Patient was continued on home Keppra 750 mg twice daily and neurology was consulted.  Keppra level was low at 5.6 however patient had not taken Keppra for 2 days prior when she was immobile.  EEG showed moderate diffuse encephalopathy with nonspecific etiology, did not show seizures or epileptiform discharges.  Patient was discharged on home Keppra 750 mg twice  daily.  Hypotension Patient was persistently hypotensive, was placed on midodrine 5 mg 3 times daily by nephrology. BP remained soft, midodrine increased to 10 mg TID. AM cortisol was checked and was low at 3, ACTH stim test was negative. Hypotension improved by discharge with increased PO intake, decreased medication dosages.  T2DM Patient had a hemoglobin A1c of 7.9 on admission, down from 12.4 in February 2023.  Per patient, she has been taking 70/30 insulin 75 units 3 times daily. Patient was initially treated with SSI and Semglee 20 units daily.  She was then transitioned to 70/30 insulin at 40u at discharge given continued higher BG but need for reduction given potential hypoglycemia contributing to above LOC.  Urinary retention, UTI Throughout the hospitalization, patient required in and out catheterization intermittently due to urinary retention. Patient began experiencing dysuria, UA with findings of UTI. Keflex > cefadroxil for treatment outpatient at discharge.  Electrolyte derangements Low potassium and magnesium repleted appropriately and stable on discharge.  Issues for follow-up: 1. Continue to discuss healthy ways to lose weight 2. Follow up aldosterone renin activity ratio, collected 7/01 while admitted 3. Reassess anxiety levels on Buspar 4. Check BG control on lower insulin, simplify regimen 5. Follow up UTI symptoms, culture 6. Recommend decreasing narcotic dosing- patient is on very high MME that is likely a main culprit in falls and deconditioning 7. Recommend decreasing midodrine slowly for BP  Discharge Diagnoses/Problem List:  Principal Problem for Admission: prerenal AKI, rhabdomyolysis Other Problems addressed during stay:  Present on Admission:  Chronic pain  (Resolved) Loss of consciousness ([place])  Disposition: Home with HHPT  Discharge Condition: Stable  Discharge Exam:  Blood pressure  113/75, pulse 81, temperature 98.3 F (36.8 C), temperature source  Oral, resp. rate 20, height '5\' 6"'  (1.676 m), weight 129.7 kg, SpO2 93 %.  General: Lying in bed, conversant, in NAD Cardiovascular: RRR, no m/r/g Respiratory: CTAB anteriorly Abdomen: Soft, non-distended, non-tender Extremities: Moves all extremities grossly equally though has knee pain  Significant Labs and Imaging:     Latest Ref Rng & Units 09/19/2021    1:25 AM 09/18/2021    1:56 PM 05/16/2021    4:18 AM  CBC  WBC 4.0 - 10.5 K/uL 11.8  18.4  4.8   Hemoglobin 12.0 - 15.0 g/dL 12.8  14.7  12.4   Hematocrit 36.0 - 46.0 % 37.9  44.1  39.9   Platelets 150 - 400 K/uL 193  274  142        Latest Ref Rng & Units 09/23/2021    4:01 AM 09/22/2021    6:16 AM 09/21/2021    8:37 AM  BMP  Glucose 70 - 99 mg/dL 213  298  238   BUN 6 - 20 mg/dL '13  16  17   ' Creatinine 0.44 - 1.00 mg/dL 0.89  0.84  0.96   Sodium 135 - 145 mmol/L 140  138  140   Potassium 3.5 - 5.1 mmol/L 4.2  3.9  3.7   Chloride 98 - 111 mmol/L 104  104  108   CO2 22 - 32 mmol/L '27  26  24   ' Calcium 8.9 - 10.3 mg/dL 9.2  8.8  8.8    Urinalysis    Component Value Date/Time   COLORURINE YELLOW 09/27/2021 2044   APPEARANCEUR CLOUDY (A) 09/27/2021 2044   LABSPEC 1.016 09/27/2021 2044   PHURINE 5.0 09/27/2021 2044   GLUCOSEU NEGATIVE 09/27/2021 2044   HGBUR LARGE (A) 09/27/2021 2044   BILIRUBINUR NEGATIVE 09/27/2021 2044   KETONESUR NEGATIVE 09/27/2021 2044   PROTEINUR 30 (A) 09/27/2021 2044   UROBILINOGEN 0.2 09/05/2012 1141   NITRITE POSITIVE (A) 09/27/2021 2044   LEUKOCYTESUR LARGE (A) 09/27/2021 2044   XR of R/L knee: IMPRESSION: Marked 3 compartment osteoarthritis, without acute superimposed finding.  XR lumbar spine: IMPRESSION: 1. No acute osseous abnormality 2. Multilevel degenerative changes, worst at L3-L4 and L4-L5. 3. 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  Renal US: IMPRESSION: There is no hydronephrosis.  EEG: IMPRESSION: This study is suggestive of moderate  diffuse encephalopathy, nonspecific etiology. No seizures or epileptiform discharges were seen throughout the recording.  ECHO: IMPRESSIONS   1. Left ventricular ejection fraction, by estimation, is 55 to 60%. The  left ventricle has normal function. The left ventricle has no regional  wall motion abnormalities. Left ventricular diastolic parameters are  indeterminate.   2. Right ventricular systolic function was not well visualized. The right  ventricular size is normal. There is normal pulmonary artery systolic  pressure. The estimated right ventricular systolic pressure is 09.2 mmHg.   3. The mitral valve is normal in structure. No evidence of mitral valve  regurgitation. No evidence of mitral stenosis.   4. The aortic valve is tricuspid. Aortic valve regurgitation is not  visualized. Aortic valve sclerosis/calcification is present, without any  evidence of aortic stenosis. Aortic valve area, by VTI measures 3.01 cm.  Aortic valve mean gradient measures  5.0 mmHg. Aortic valve Vmax measures 1.54 m/s.   5. The inferior vena cava is normal in size with <50% respiratory  variability, suggesting right atrial pressure of 8 mmHg.  Results/Tests Pending at Time of Discharge: urine culture, aldosterone renin activity level  Discharge Medications:  Allergies as of 09/28/2021       Reactions   Penicillins Hives, Other (See Comments)   HAS PT DEVELOPED SEVERE RASH INVOLVING MUCUS MEMBRANES or SKIN NECROSIS: #  #  YES  #  # PATIENT HAS HAD A PCN REACTION THAT REQUIRED HOSPITALIZATION:  #  #  YES  #  #  Tolerates amoxicillin on multiple occasions per Dr. Darrel Hoover note 11/01/17.  TDD.   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    Sulfa Antibiotics Hives   Dulaglutide Other (See Comments)   unknown   No [facility] Food Allergies Hives   Chicken   Versed [midazolam] Nausea And Vomiting   Pt had medication  on October 16 2017 with no issues        Medication List     STOP taking these medications    amitriptyline 25 MG tablet Commonly known as: ELAVIL   ARIPiprazole 5 MG tablet Commonly known as: ABILIFY   carisoprodol 350 MG tablet Commonly known as: SOMA   Diclofenac Sodium 2 % Soln   doxycycline 100 MG tablet Commonly known as: VIBRA-TABS   phentermine 37.5 MG tablet Commonly known as: ADIPEX-P   promethazine 25 MG tablet Commonly known as: PHENERGAN   sitaGLIPtin 50 MG tablet Commonly known as: JANUVIA       TAKE these medications    acetaminophen 500 MG tablet Commonly known as: TYLENOL Take 500 mg by mouth every 6 (six) hours as needed for moderate pain. What changed: Another medication with the same name was removed. Continue taking this medication, and follow the directions you see here.   Aspirin Low Dose 81 MG tablet Generic drug: aspirin EC Take 81 mg by mouth daily.   atorvastatin 20 MG tablet Commonly known as: LIPITOR Take 20 mg by mouth daily.   Biotin 10000 MCG Tabs Take 10,000 mcg by mouth daily.   busPIRone 7.5 MG tablet Commonly known as: BUSPAR Take 1 tablet (7.5 mg total) by mouth 2 (two) times daily.   cefadroxil 500 MG capsule Commonly known as: DURICEF Take 1 capsule (500 mg total) by mouth 2 (two) times daily for 6 days.   cetirizine 10 MG tablet Commonly known as: ZYRTEC Take 10 mg by mouth daily.   DULoxetine 60 MG capsule Commonly known as: CYMBALTA Take 1 capsule (60 mg total) by mouth daily. What changed: when to take this   fentaNYL 50 MCG/HR Commonly known as: [place] 1 patch onto the skin every 3 (three) days.   gabapentin 600 MG tablet Commonly known as: NEURONTIN Take 0.5 tablets (300 mg total) by mouth at bedtime. What changed: when to take this   levETIRAcetam 750 MG tablet Commonly known as: KEPPRA Take 750 mg by mouth 2 (two) times daily.   levothyroxine 137 MCG tablet Commonly known as:  SYNTHROID Take 137 mcg by mouth daily.   lidocaine 5 % Commonly known as: LIDODERM Place 3 patches onto the skin See admin instructions. Place 3 patches daily to affected areas and replace every 24 hours   midodrine 10 MG tablet Commonly known as: PROAMATINE Take 1 tablet (10 mg total) by mouth 3 (three) times daily with meals.   NexIUM 40 MG capsule Generic drug: esomeprazole Take 40 mg by mouth daily.   NovoLOG Mix 70/30 FlexPen (70-30) 100 UNIT/ML FlexPen Generic  drug: insulin aspart protamine - aspart Inject 40 Units into the skin 2 (two) times daily with a meal. What changed:  how much to take when to take this additional instructions   nystatin powder Commonly known as: MYCOSTATIN/NYSTOP Apply 1 application  topically 3 (three) times daily as needed (skin irritation).   omega-3 acid ethyl esters 1 g capsule Commonly known as: LOVAZA Take 1 capsule by mouth 2 (two) times daily.   Oxycodone HCl 10 MG Tabs Take 1 tablet (10 mg total) by mouth every 6 (six) hours as needed (for pain).   Ozempic (0.25 or 0.5 MG/DOSE) 2 MG/3ML Sopn Generic drug: Semaglutide(0.25 or 0.5MG/DOS) Inject 0.25 mg into the skin once a week.   Symbicort 160-4.5 MCG/ACT inhaler Generic drug: budesonide-formoterol Inhale 2 puffs into the lungs 2 (two) times daily as needed for shortness of breath or wheezing.   triamcinolone cream 0.1 % Commonly known as: KENALOG Apply 1 application topically 2 (two) times daily as needed (skin rash).   Vitamin D3 50 MCG (2000 UT) Tabs Take 1 tablet by mouth daily.   Vitamin Deficiency System-B12 1000 MCG/ML Kit Generic drug: Cyanocobalamin Inject 1,000 mcg as directed every 30 (thirty) days.               Durable Medical Equipment  (From admission, onward)           Start     Ordered   09/25/21 1310  For home use only DME Walker rolling  Once       Question Answer Comment  Walker: With 5 Inch Wheels   Patient needs a walker to treat with  the following condition Weakness      09/25/21 1309           Discharge Instructions: Please refer to Patient Instructions section of EMR for full details.  Patient was counseled important signs and symptoms that should prompt return to medical care, changes in medications, dietary instructions, activity restrictions, and follow up appointments.   Follow-Up Appointments:  [place] Follow up.   Specialty: Home Health Services Why: HH-PT arranged. They will contact you to schedule apt Contact information: [place] [address] [phone]         Martyn Malay, MD. Go on 10/01/2021.   Specialty: Family Medicine Why: Please arrive 15 minutes before your appointment at 11:30 AM for hospital follow up. Contact information: [place] [address] [phone]                Ethelene Hal, MD 09/28/2021, 1:53 PM PGY-1, [facility]   Upper Level Addendum: I have reviewed the above note, making necessary revisions as appropriate. I agree with the medical decision making and physical exam as noted above. Ezequiel Essex, MD PGY-3 [facility]

## 2021-09-29 NOTE — Progress Notes (Signed)
HS meds given to pt. Explained to pt that her Buspar and Midodrine are at [facility] in [address] waiting pick up. Showed pt on EVS that meds were ordered and at the pharmacy mentioned above. Pt's family member in rm and pt ready for dischg to home.

## 2021-09-29 NOTE — Progress Notes (Signed)
Pt left unit per w/c with Santiago Glad NT and family member for d/c to home. Pt left unit in stable condition with all her belongings.

## 2021-09-29 NOTE — Progress Notes (Signed)
Discharge instructions written, explained, and verbally given to pt by Margot Ables RN CN (dayshift). This Probation officer dischg pt at [phone] when family arrived.

## 2021-09-30 LAB — URINE CULTURE: Culture: >=100000 — AB

## 2021-10-01 ENCOUNTER — Ambulatory Visit: Payer: Medicare HMO | Admitting: Family Medicine

## 2021-10-02 ENCOUNTER — Telehealth: Payer: Self-pay | Admitting: Orthopaedic Surgery

## 2021-10-02 NOTE — Telephone Encounter (Signed)
Patient called advised she just got out of the hospital Friday. Patient asked if she can be worked in to get cortisone injection in both of her knees? The number to contact patient is [phone]

## 2021-10-07 LAB — ALDOSTERONE + RENIN ACTIVITY W/ RATIO
ALDO / PRA Ratio: <1.6 (ref 0.0–30.0)
Aldosterone: <1 ng/dL (ref 0.0–30.0)
PRA LC/MS/MS: 0.624 ng/mL/hr (ref 0.167–5.380)

## 2021-10-10 ENCOUNTER — Ambulatory Visit: Payer: Medicare HMO | Admitting: Orthopaedic Surgery

## 2021-10-10 ENCOUNTER — Encounter: Payer: Self-pay | Admitting: Orthopaedic Surgery

## 2021-10-10 VITALS — Ht 65.0 in | Wt 264.4 lb

## 2021-10-10 DIAGNOSIS — M1711 Unilateral primary osteoarthritis, right knee: Secondary | ICD-10-CM

## 2021-10-10 DIAGNOSIS — M25561 Pain in right knee: Secondary | ICD-10-CM | POA: Diagnosis not present

## 2021-10-10 DIAGNOSIS — G8929 Other chronic pain: Secondary | ICD-10-CM

## 2021-10-10 DIAGNOSIS — M25562 Pain in left knee: Secondary | ICD-10-CM | POA: Diagnosis not present

## 2021-10-10 DIAGNOSIS — M1712 Unilateral primary osteoarthritis, left knee: Secondary | ICD-10-CM

## 2021-10-10 MED ORDER — METHYLPREDNISOLONE ACETATE 40 MG/ML IJ SUSP
40.0000 mg | INTRAMUSCULAR | Status: AC | PRN
  Administered 2021-10-10: 40 mg via INTRA_ARTICULAR

## 2021-10-10 MED ORDER — LIDOCAINE HCL 1 % IJ SOLN
3.0000 mL | INTRAMUSCULAR | Status: AC | PRN
  Administered 2021-10-10: 3 mL

## 2021-10-10 NOTE — Progress Notes (Signed)
   Procedure Note  Patient: [name]             Date of Birth: [birth year]           MRN: [id number]             Visit Date: 10/10/2021  Procedures: Visit Diagnoses:  1. Chronic pain of left knee   2. Chronic pain of right knee   3. Unilateral primary osteoarthritis, right knee   4. Unilateral primary osteoarthritis, left knee     Large Joint Inj: R knee on 10/10/2021 10:50 AM Indications: diagnostic evaluation and pain Details: 22 G 1.5 in needle, superolateral approach  Arthrogram: No  Medications: 3 mL lidocaine 1 %; 40 mg methylPREDNISolone acetate 40 MG/ML Outcome: tolerated well, no immediate complications Procedure, treatment alternatives, risks and benefits explained, specific risks discussed. Consent was given by the patient. Immediately prior to procedure a time out was called to verify the correct patient, procedure, equipment, support staff and site/side marked as required. Patient was prepped and draped in the usual sterile fashion.    Large Joint Inj: L knee on 10/10/2021 10:50 AM Indications: diagnostic evaluation and pain Details: 22 G 1.5 in needle, superolateral approach  Arthrogram: No  Medications: 3 mL lidocaine 1 %; 40 mg methylPREDNISolone acetate 40 MG/ML Outcome: tolerated well, no immediate complications Procedure, treatment alternatives, risks and benefits explained, specific risks discussed. Consent was given by the patient. Immediately prior to procedure a time out was called to verify the correct patient, procedure, equipment, support staff and site/side marked as required. Patient was prepped and draped in the usual sterile fashion.

## 2021-10-10 NOTE — Progress Notes (Signed)
The patient is well-known to me.  We have seen her for many years now.  At 1 point she has been significantly morbidly obese and with diabetes and blood glucose out of control.  She is work significantly on weight loss and blood glucose control.  Her last hemoglobin A1c was 7.93 weeks ago but she said this week it was 7.1.  She is also some amount of weight.  Her BMI is down to 44.  She does come in from time to time for steroid injections in both her knees and she is requesting that today.  She had been hospitalized about a month ago for a seizure and a significant fall.  She is really trying hard to get her health under control and even today looking at her she looks the best that I have seen her in terms of her weight and just examining her knees.  She does not have a large soft tissue envelope like she needs to have around her knees.  They do have pain throughout the arc of motion and severe degenerative changes.  I did place a steroid injection of both knees today.  I would like to see her back in 3 months for a repeat weight and BMI calculation.  I would also like new x-rays of both knees at that visit.  I believe we are getting closer to being able to schedule her for knee replacement.

## 2021-11-04 ENCOUNTER — Other Ambulatory Visit: Payer: Self-pay | Admitting: Student

## 2021-12-26 ENCOUNTER — Ambulatory Visit: Payer: Medicare HMO | Admitting: Orthopaedic Surgery

## 2022-01-09 ENCOUNTER — Encounter: Payer: Self-pay | Admitting: Orthopaedic Surgery

## 2022-01-09 ENCOUNTER — Ambulatory Visit: Payer: Medicare HMO | Admitting: Orthopaedic Surgery

## 2022-01-09 VITALS — Ht 65.0 in | Wt 263.8 lb

## 2022-01-09 DIAGNOSIS — M25562 Pain in left knee: Secondary | ICD-10-CM | POA: Diagnosis not present

## 2022-01-09 DIAGNOSIS — M1712 Unilateral primary osteoarthritis, left knee: Secondary | ICD-10-CM

## 2022-01-09 DIAGNOSIS — M1711 Unilateral primary osteoarthritis, right knee: Secondary | ICD-10-CM | POA: Diagnosis not present

## 2022-01-09 DIAGNOSIS — G8929 Other chronic pain: Secondary | ICD-10-CM

## 2022-01-09 DIAGNOSIS — M25561 Pain in right knee: Secondary | ICD-10-CM | POA: Diagnosis not present

## 2022-01-09 DIAGNOSIS — M170 Bilateral primary osteoarthritis of knee: Secondary | ICD-10-CM | POA: Diagnosis not present

## 2022-01-09 MED ORDER — LIDOCAINE HCL 1 % IJ SOLN
3.0000 mL | INTRAMUSCULAR | Status: AC | PRN
  Administered 2022-01-09: 3 mL

## 2022-01-09 MED ORDER — METHYLPREDNISOLONE ACETATE 40 MG/ML IJ SUSP
40.0000 mg | INTRAMUSCULAR | Status: AC | PRN
  Administered 2022-01-09: 40 mg via INTRA_ARTICULAR

## 2022-01-09 NOTE — Progress Notes (Signed)
[name] comes in today requesting steroid injections in both her knees.  She has well-documented severe arthritis in both knees with the right worse than the left.  However, she is a poorly controlled diabetic.  We last injected both knees at once in July.  Later that month her blood glucose went up to 350.  We contacted her primary care office and her most recent hemoglobin A1c in September was 10.8.  I told her today that is not medically sound or prudent to provide steroid injections in both knees due to the detrimental effect it is having on her blood glucose.  We decided to just inject her right knee today with steroid.  She does have a friend with her.  I did place a steroid injection in her right knee today without difficulty.  I want her to document her blood glucose on a daily basis for the next 2 weeks.  We will see her back in 4 weeks to consider a steroid injection in her left knee.  However if her blood glucose elevates significantly after her right knee injection, we should avoid any further injections in the future.  She understands that we cannot proceed with any knee replacement surgery and her hemoglobin A1c is below 7.7.  Her BMI today is 44 but we can tell already that she is lost significant weight.  Once we do get to the point where we are considering knee replacement surgery of her right knee we would need new x-rays of that knee.     Procedure Note  Patient: [name]             Date of Birth: 05-Jun-1970           MRN: [id number]             Visit Date: 01/09/2022  Procedures: Visit Diagnoses:  1. Chronic pain of left knee   2. Chronic pain of right knee   3. Unilateral primary osteoarthritis, right knee   4. Unilateral primary osteoarthritis, left knee     Large Joint Inj: R knee on 01/09/2022 4:47 PM Indications: diagnostic evaluation and pain Details: 22 G 1.5 in needle, superolateral approach  Arthrogram: No  Medications: 3 mL lidocaine 1 %; 40 mg  methylPREDNISolone acetate 40 MG/ML Outcome: tolerated well, no immediate complications Procedure, treatment alternatives, risks and benefits explained, specific risks discussed. Consent was given by the patient. Immediately prior to procedure a time out was called to verify the correct patient, procedure, equipment, support staff and site/side marked as required. Patient was prepped and draped in the usual sterile fashion.

## 2022-01-10 ENCOUNTER — Ambulatory Visit: Payer: Medicare HMO | Admitting: Orthopaedic Surgery

## 2022-01-31 ENCOUNTER — Ambulatory Visit (HOSPITAL_COMMUNITY): Payer: Medicare HMO | Admitting: Psychiatry

## 2022-01-31 ENCOUNTER — Encounter (HOSPITAL_COMMUNITY): Payer: Self-pay | Admitting: Psychiatry

## 2022-01-31 VITALS — BP 142/100 | HR 91 | Temp 97.7°F | Resp 18 | Ht 65.0 in | Wt 258.0 lb

## 2022-01-31 DIAGNOSIS — F411 Generalized anxiety disorder: Secondary | ICD-10-CM | POA: Diagnosis not present

## 2022-01-31 DIAGNOSIS — F4310 Post-traumatic stress disorder, unspecified: Secondary | ICD-10-CM | POA: Diagnosis not present

## 2022-01-31 DIAGNOSIS — F418 Other specified anxiety disorders: Secondary | ICD-10-CM | POA: Diagnosis not present

## 2022-01-31 DIAGNOSIS — F331 Major depressive disorder, recurrent, moderate: Secondary | ICD-10-CM | POA: Diagnosis not present

## 2022-01-31 DIAGNOSIS — F5102 Adjustment insomnia: Secondary | ICD-10-CM

## 2022-01-31 MED ORDER — BUSPIRONE HCL 7.5 MG PO TABS: 7.5000 mg | ORAL_TABLET | Freq: Three times a day (TID) | ORAL | 0 refills | Status: DC

## 2022-01-31 MED ORDER — DULOXETINE HCL 30 MG PO CPEP: 30.0000 mg | ORAL_CAPSULE | Freq: Every day | ORAL | 1 refills | Status: DC
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
## 2022-02-06 ENCOUNTER — Ambulatory Visit: Payer: Medicare HMO | Admitting: Orthopaedic Surgery

## 2022-02-28 ENCOUNTER — Other Ambulatory Visit (HOSPITAL_COMMUNITY): Payer: Self-pay

## 2022-02-28 MED ORDER — BUSPIRONE HCL 7.5 MG PO TABS: 7.5000 mg | ORAL_TABLET | Freq: Three times a day (TID) | ORAL | 0 refills | Status: DC

## 2022-03-04 ENCOUNTER — Ambulatory Visit (HOSPITAL_COMMUNITY): Payer: Medicare HMO | Admitting: Licensed Clinical Social Worker

## 2022-03-04 NOTE — Progress Notes (Signed)
Patient scheduled for assessment for therapy and did not show.

## 2022-03-07 ENCOUNTER — Ambulatory Visit (HOSPITAL_COMMUNITY): Payer: Medicare Other | Admitting: Psychiatry

## 2022-03-28 ENCOUNTER — Ambulatory Visit: Payer: Medicare HMO | Admitting: Orthopaedic Surgery

## 2022-04-11 ENCOUNTER — Encounter (HOSPITAL_COMMUNITY): Payer: Self-pay

## 2022-04-11 ENCOUNTER — Ambulatory Visit (HOSPITAL_COMMUNITY): Payer: Medicare HMO | Admitting: Licensed Clinical Social Worker

## 2022-04-11 NOTE — Progress Notes (Signed)
Therapist contacted patient through My Chart and she did not respond.

## 2022-05-02 ENCOUNTER — Ambulatory Visit: Payer: Medicare HMO | Admitting: Orthopaedic Surgery

## 2022-05-22 ENCOUNTER — Ambulatory Visit: Payer: Medicare PPO | Admitting: Orthopaedic Surgery

## 2022-07-02 ENCOUNTER — Other Ambulatory Visit: Payer: Self-pay

## 2022-07-02 ENCOUNTER — Emergency Department (HOSPITAL_COMMUNITY): Payer: Medicare PPO

## 2022-07-02 ENCOUNTER — Inpatient Hospital Stay (HOSPITAL_COMMUNITY)
Admission: EM | Admit: 2022-07-02 | Discharge: 2022-07-09 | DRG: 391 | Disposition: A | Discharge status: Home / Self Care | Payer: Medicare PPO | Attending: Internal Medicine | Admitting: Internal Medicine

## 2022-07-02 DIAGNOSIS — E1165 Type 2 diabetes mellitus with hyperglycemia: Secondary | ICD-10-CM | POA: Diagnosis present

## 2022-07-02 DIAGNOSIS — M25562 Pain in left knee: Secondary | ICD-10-CM | POA: Diagnosis present

## 2022-07-02 DIAGNOSIS — Z880 Allergy status to penicillin: Secondary | ICD-10-CM

## 2022-07-02 DIAGNOSIS — Z1152 Encounter for screening for COVID-19: Secondary | ICD-10-CM

## 2022-07-02 DIAGNOSIS — Z794 Long term (current) use of insulin: Secondary | ICD-10-CM

## 2022-07-02 DIAGNOSIS — Z882 Allergy status to sulfonamides status: Secondary | ICD-10-CM

## 2022-07-02 DIAGNOSIS — Z79890 Hormone replacement therapy (postmenopausal): Secondary | ICD-10-CM

## 2022-07-02 DIAGNOSIS — K219 Gastro-esophageal reflux disease without esophagitis: Secondary | ICD-10-CM | POA: Diagnosis present

## 2022-07-02 DIAGNOSIS — M549 Dorsalgia, unspecified: Secondary | ICD-10-CM | POA: Diagnosis present

## 2022-07-02 DIAGNOSIS — M797 Fibromyalgia: Secondary | ICD-10-CM | POA: Diagnosis present

## 2022-07-02 DIAGNOSIS — F418 Other specified anxiety disorders: Secondary | ICD-10-CM | POA: Diagnosis present

## 2022-07-02 DIAGNOSIS — F419 Anxiety disorder, unspecified: Secondary | ICD-10-CM

## 2022-07-02 DIAGNOSIS — Z881 Allergy status to other antibiotic agents status: Secondary | ICD-10-CM

## 2022-07-02 DIAGNOSIS — Z8249 Family history of ischemic heart disease and other diseases of the circulatory system: Secondary | ICD-10-CM

## 2022-07-02 DIAGNOSIS — R112 Nausea with vomiting, unspecified: Principal | ICD-10-CM | POA: Diagnosis present

## 2022-07-02 DIAGNOSIS — E119 Type 2 diabetes mellitus without complications: Secondary | ICD-10-CM

## 2022-07-02 DIAGNOSIS — Z888 Allergy status to other drugs, medicaments and biological substances status: Secondary | ICD-10-CM

## 2022-07-02 DIAGNOSIS — E876 Hypokalemia: Secondary | ICD-10-CM | POA: Diagnosis present

## 2022-07-02 DIAGNOSIS — G8929 Other chronic pain: Secondary | ICD-10-CM | POA: Diagnosis present

## 2022-07-02 DIAGNOSIS — A059 Bacterial foodborne intoxication, unspecified: Principal | ICD-10-CM | POA: Diagnosis present

## 2022-07-02 DIAGNOSIS — E1151 Type 2 diabetes mellitus with diabetic peripheral angiopathy without gangrene: Secondary | ICD-10-CM | POA: Diagnosis present

## 2022-07-02 DIAGNOSIS — E039 Hypothyroidism, unspecified: Secondary | ICD-10-CM

## 2022-07-02 DIAGNOSIS — Z6841 Body Mass Index (BMI) 40.0 and over, adult: Secondary | ICD-10-CM

## 2022-07-02 DIAGNOSIS — D179 Benign lipomatous neoplasm, unspecified: Secondary | ICD-10-CM | POA: Diagnosis present

## 2022-07-02 DIAGNOSIS — Z87442 Personal history of urinary calculi: Secondary | ICD-10-CM

## 2022-07-02 DIAGNOSIS — Z8616 Personal history of COVID-19: Secondary | ICD-10-CM

## 2022-07-02 DIAGNOSIS — Z79899 Other long term (current) drug therapy: Secondary | ICD-10-CM

## 2022-07-02 DIAGNOSIS — Z7951 Long term (current) use of inhaled steroids: Secondary | ICD-10-CM

## 2022-07-02 DIAGNOSIS — R569 Unspecified convulsions: Secondary | ICD-10-CM

## 2022-07-02 DIAGNOSIS — Z833 Family history of diabetes mellitus: Secondary | ICD-10-CM

## 2022-07-02 DIAGNOSIS — F32A Depression, unspecified: Secondary | ICD-10-CM | POA: Diagnosis present

## 2022-07-02 DIAGNOSIS — H8110 Benign paroxysmal vertigo, unspecified ear: Secondary | ICD-10-CM | POA: Diagnosis present

## 2022-07-02 DIAGNOSIS — F339 Major depressive disorder, recurrent, unspecified: Secondary | ICD-10-CM | POA: Diagnosis present

## 2022-07-02 DIAGNOSIS — E875 Hyperkalemia: Secondary | ICD-10-CM | POA: Diagnosis present

## 2022-07-02 DIAGNOSIS — R059 Cough, unspecified: Secondary | ICD-10-CM | POA: Diagnosis present

## 2022-07-02 DIAGNOSIS — Z62810 Personal history of physical and sexual abuse in childhood: Secondary | ICD-10-CM

## 2022-07-02 DIAGNOSIS — Z9151 Personal history of suicidal behavior: Secondary | ICD-10-CM

## 2022-07-02 DIAGNOSIS — Z8614 Personal history of Methicillin resistant Staphylococcus aureus infection: Secondary | ICD-10-CM

## 2022-07-02 DIAGNOSIS — Z9049 Acquired absence of other specified parts of digestive tract: Secondary | ICD-10-CM

## 2022-07-02 DIAGNOSIS — M1990 Unspecified osteoarthritis, unspecified site: Secondary | ICD-10-CM | POA: Diagnosis present

## 2022-07-02 DIAGNOSIS — E662 Morbid (severe) obesity with alveolar hypoventilation: Secondary | ICD-10-CM | POA: Diagnosis present

## 2022-07-02 DIAGNOSIS — R42 Dizziness and giddiness: Secondary | ICD-10-CM

## 2022-07-02 DIAGNOSIS — R000 Tachycardia, unspecified: Secondary | ICD-10-CM | POA: Diagnosis present

## 2022-07-02 DIAGNOSIS — M25561 Pain in right knee: Secondary | ICD-10-CM | POA: Diagnosis present

## 2022-07-02 DIAGNOSIS — Z91018 Allergy to other foods: Secondary | ICD-10-CM

## 2022-07-02 DIAGNOSIS — R45851 Suicidal ideations: Secondary | ICD-10-CM

## 2022-07-02 DIAGNOSIS — G40909 Epilepsy, unspecified, not intractable, without status epilepticus: Secondary | ICD-10-CM | POA: Diagnosis present

## 2022-07-02 DIAGNOSIS — Z7985 Long-term (current) use of injectable non-insulin antidiabetic drugs: Secondary | ICD-10-CM

## 2022-07-02 DIAGNOSIS — F4322 Adjustment disorder with anxiety: Secondary | ICD-10-CM | POA: Diagnosis present

## 2022-07-02 DIAGNOSIS — G894 Chronic pain syndrome: Secondary | ICD-10-CM | POA: Diagnosis present

## 2022-07-02 DIAGNOSIS — Z7982 Long term (current) use of aspirin: Secondary | ICD-10-CM

## 2022-07-02 DIAGNOSIS — I951 Orthostatic hypotension: Secondary | ICD-10-CM | POA: Diagnosis present

## 2022-07-02 DIAGNOSIS — E1110 Type 2 diabetes mellitus with ketoacidosis without coma: Secondary | ICD-10-CM | POA: Diagnosis present

## 2022-07-02 DIAGNOSIS — Z634 Disappearance and death of family member: Secondary | ICD-10-CM

## 2022-07-02 DIAGNOSIS — R739 Hyperglycemia, unspecified: Secondary | ICD-10-CM | POA: Diagnosis not present

## 2022-07-02 DIAGNOSIS — I10 Essential (primary) hypertension: Secondary | ICD-10-CM | POA: Diagnosis present

## 2022-07-02 DIAGNOSIS — Z7409 Other reduced mobility: Secondary | ICD-10-CM | POA: Diagnosis present

## 2022-07-02 DIAGNOSIS — E538 Deficiency of other specified B group vitamins: Secondary | ICD-10-CM | POA: Diagnosis present

## 2022-07-02 LAB — I-STAT VENOUS BLOOD GAS, ED
Acid-base deficit: 5 mmol/L — ABNORMAL HIGH (ref 0.0–2.0)
Bicarbonate: 19.9 mmol/L — ABNORMAL LOW (ref 20.0–28.0)
Calcium, Ion: 0.97 mmol/L — ABNORMAL LOW (ref 1.15–1.40)
HCT: 49 % — ABNORMAL HIGH (ref 36.0–46.0)
Hemoglobin: 16.7 g/dL — ABNORMAL HIGH (ref 12.0–15.0)
O2 Saturation: 69 %
Potassium: >8.5 mmol/L (ref 3.5–5.1)
Sodium: 124 mmol/L — ABNORMAL LOW (ref 135–145)
TCO2: 21 mmol/L — ABNORMAL LOW (ref 22–32)
pCO2, Ven: 36.7 mmHg — ABNORMAL LOW (ref 44–60)
pH, Ven: 7.341 (ref 7.25–7.43)
pO2, Ven: 38 mmHg (ref 32–45)

## 2022-07-02 LAB — BASIC METABOLIC PANEL
Anion gap: 11 (ref 5–15)
BUN: 24 mg/dL — ABNORMAL HIGH (ref 6–20)
CO2: 20 mmol/L — ABNORMAL LOW (ref 22–32)
Calcium: 8.7 mg/dL — ABNORMAL LOW (ref 8.9–10.3)
Chloride: 101 mmol/L (ref 98–111)
Creatinine, Ser: 0.99 mg/dL (ref 0.44–1.00)
GFR, Estimated: >60 mL/min (ref >60)
Glucose, Bld: 311 mg/dL — ABNORMAL HIGH (ref 70–99)
Potassium: 4 mmol/L (ref 3.5–5.1)
Sodium: 132 mmol/L — ABNORMAL LOW (ref 135–145)

## 2022-07-02 LAB — CBC WITH DIFFERENTIAL/PLATELET
Abs Immature Granulocytes: 0.03 10*3/uL (ref 0.00–0.07)
Basophils Absolute: 0 10*3/uL (ref 0.0–0.1)
Basophils Relative: 0 %
Eosinophils Absolute: 0 10*3/uL (ref 0.0–0.5)
Eosinophils Relative: 0 %
HCT: 46.9 % — ABNORMAL HIGH (ref 36.0–46.0)
Hemoglobin: 16.3 g/dL — ABNORMAL HIGH (ref 12.0–15.0)
Immature Granulocytes: 0 %
Lymphocytes Relative: 9 %
Lymphs Abs: 0.9 10*3/uL (ref 0.7–4.0)
MCH: 31.9 pg (ref 26.0–34.0)
MCHC: 34.8 g/dL (ref 30.0–36.0)
MCV: 91.8 fL (ref 80.0–100.0)
Monocytes Absolute: 0.7 10*3/uL (ref 0.1–1.0)
Monocytes Relative: 6 %
Neutro Abs: 9.2 10*3/uL — ABNORMAL HIGH (ref 1.7–7.7)
Neutrophils Relative %: 85 %
Platelets: 273 10*3/uL (ref 150–400)
RBC: 5.11 MIL/uL (ref 3.87–5.11)
RDW: 13 % (ref 11.5–15.5)
WBC: 10.8 10*3/uL — ABNORMAL HIGH (ref 4.0–10.5)
nRBC: 0 % (ref 0.0–0.2)

## 2022-07-02 LAB — COMPREHENSIVE METABOLIC PANEL
ALT: 57 U/L — ABNORMAL HIGH (ref 0–44)
AST: 51 U/L — ABNORMAL HIGH (ref 15–41)
Albumin: 3.7 g/dL (ref 3.5–5.0)
Alkaline Phosphatase: 129 U/L — ABNORMAL HIGH (ref 38–126)
Anion gap: 16 — ABNORMAL HIGH (ref 5–15)
BUN: 29 mg/dL — ABNORMAL HIGH (ref 6–20)
CO2: 17 mmol/L — ABNORMAL LOW (ref 22–32)
Calcium: 9.1 mg/dL (ref 8.9–10.3)
Chloride: 98 mmol/L (ref 98–111)
Creatinine, Ser: 1.09 mg/dL — ABNORMAL HIGH (ref 0.44–1.00)
GFR, Estimated: >60 mL/min (ref >60)
Glucose, Bld: 461 mg/dL — ABNORMAL HIGH (ref 70–99)
Potassium: 3.8 mmol/L (ref 3.5–5.1)
Sodium: 131 mmol/L — ABNORMAL LOW (ref 135–145)
Total Bilirubin: 1.2 mg/dL (ref 0.3–1.2)
Total Protein: 7.1 g/dL (ref 6.5–8.1)

## 2022-07-02 LAB — CBG MONITORING, ED
Glucose-Capillary: 475 mg/dL — ABNORMAL HIGH (ref 70–99)
Glucose-Capillary: 315 mg/dL — ABNORMAL HIGH (ref 70–99)

## 2022-07-02 LAB — URINALYSIS, ROUTINE W REFLEX MICROSCOPIC
Bacteria, UA: NONE SEEN
Bilirubin Urine: NEGATIVE
Glucose, UA: >=500 mg/dL — AB
Hgb urine dipstick: NEGATIVE
Ketones, ur: NEGATIVE mg/dL
Leukocytes,Ua: NEGATIVE
Nitrite: NEGATIVE
Protein, ur: NEGATIVE mg/dL
Specific Gravity, Urine: 1.029 (ref 1.005–1.030)
pH: 5 (ref 5.0–8.0)

## 2022-07-02 LAB — I-STAT CHEM 8, ED
BUN: 30 mg/dL — ABNORMAL HIGH (ref 6–20)
Calcium, Ion: 1.11 mmol/L — ABNORMAL LOW (ref 1.15–1.40)
Chloride: 99 mmol/L (ref 98–111)
Creatinine, Ser: 0.9 mg/dL (ref 0.44–1.00)
Glucose, Bld: 461 mg/dL — ABNORMAL HIGH (ref 70–99)
HCT: 50 % — ABNORMAL HIGH (ref 36.0–46.0)
Hemoglobin: 17 g/dL — ABNORMAL HIGH (ref 12.0–15.0)
Potassium: 4 mmol/L (ref 3.5–5.1)
Sodium: 132 mmol/L — ABNORMAL LOW (ref 135–145)
TCO2: 19 mmol/L — ABNORMAL LOW (ref 22–32)

## 2022-07-02 LAB — I-STAT BETA HCG BLOOD, ED (MC, WL, AP ONLY): I-stat hCG, quantitative: 7.2 m[IU]/mL — ABNORMAL HIGH (ref <5)

## 2022-07-02 LAB — POC URINE PREG, ED: Preg Test, Ur: NEGATIVE

## 2022-07-02 LAB — RESP PANEL BY RT-PCR (RSV, FLU A&B, COVID)  RVPGX2
Influenza A by PCR: NEGATIVE
Influenza B by PCR: NEGATIVE
Resp Syncytial Virus by PCR: NEGATIVE
SARS Coronavirus 2 by RT PCR: NEGATIVE

## 2022-07-02 LAB — HCG, QUANTITATIVE, PREGNANCY: hCG, Beta Chain, Quant, S: 8 m[IU]/mL — ABNORMAL HIGH (ref <5)

## 2022-07-02 LAB — BETA-HYDROXYBUTYRIC ACID: Beta-Hydroxybutyric Acid: 0.63 mmol/L — ABNORMAL HIGH (ref 0.05–0.27)

## 2022-07-02 LAB — LIPASE, BLOOD: Lipase: 31 U/L (ref 11–51)

## 2022-07-02 MED ORDER — SODIUM CHLORIDE 0.9 % IV BOLUS
1000.0000 mL | Freq: Once | INTRAVENOUS | Status: AC
  Administered 2022-07-02: 1000 mL via INTRAVENOUS

## 2022-07-02 MED ORDER — LEVOTHYROXINE SODIUM 75 MCG PO TABS
150.0000 ug | ORAL_TABLET | Freq: Every day | ORAL | Status: DC
  Administered 2022-07-03 – 2022-07-09 (×7): 150 ug via ORAL
  Filled 2022-07-02 (×7): qty 2

## 2022-07-02 MED ORDER — MORPHINE SULFATE (PF) 4 MG/ML IV SOLN
4.0000 mg | Freq: Once | INTRAVENOUS | Status: AC
  Administered 2022-07-02: 4 mg via INTRAVENOUS
  Filled 2022-07-02: qty 1

## 2022-07-02 MED ORDER — INSULIN ASPART 100 UNIT/ML IJ SOLN
10.0000 [IU] | Freq: Once | INTRAMUSCULAR | Status: AC
  Administered 2022-07-02: 10 [IU] via SUBCUTANEOUS

## 2022-07-02 MED ORDER — GABAPENTIN 300 MG PO CAPS
600.0000 mg | ORAL_CAPSULE | Freq: Three times a day (TID) | ORAL | Status: DC
  Administered 2022-07-03 – 2022-07-09 (×21): 600 mg via ORAL
  Filled 2022-07-02 (×8): qty 2
  Filled 2022-07-02: qty 6
  Filled 2022-07-02 (×10): qty 2
  Filled 2022-07-02: qty 6
  Filled 2022-07-02: qty 2

## 2022-07-02 MED ORDER — HYDROMORPHONE HCL 1 MG/ML IJ SOLN
1.0000 mg | Freq: Once | INTRAMUSCULAR | Status: AC
  Administered 2022-07-02: 1 mg via INTRAVENOUS
  Filled 2022-07-02: qty 1

## 2022-07-02 MED ORDER — ONDANSETRON HCL 4 MG/2ML IJ SOLN
4.0000 mg | Freq: Four times a day (QID) | INTRAMUSCULAR | Status: DC | PRN
  Administered 2022-07-03 – 2022-07-07 (×3): 4 mg via INTRAVENOUS
  Filled 2022-07-02 (×3): qty 2

## 2022-07-02 MED ORDER — FENTANYL 50 MCG/HR TD PT72: 1.0000 | MEDICATED_PATCH | TRANSDERMAL | Status: DC

## 2022-07-02 MED ORDER — METOCLOPRAMIDE HCL 5 MG/ML IJ SOLN
10.0000 mg | Freq: Once | INTRAMUSCULAR | Status: AC
  Administered 2022-07-03: 10 mg via INTRAVENOUS
  Filled 2022-07-02: qty 2

## 2022-07-02 MED ORDER — ACETAMINOPHEN 325 MG PO TABS
650.0000 mg | ORAL_TABLET | Freq: Four times a day (QID) | ORAL | Status: DC | PRN
  Administered 2022-07-03 – 2022-07-09 (×16): 650 mg via ORAL
  Filled 2022-07-02 (×16): qty 2

## 2022-07-02 MED ORDER — ENOXAPARIN SODIUM 40 MG/0.4ML IJ SOSY
40.0000 mg | PREFILLED_SYRINGE | INTRAMUSCULAR | Status: DC
  Administered 2022-07-03 – 2022-07-08 (×6): 40 mg via SUBCUTANEOUS
  Filled 2022-07-02 (×6): qty 0.4

## 2022-07-02 MED ORDER — LEVOTHYROXINE SODIUM 25 MCG PO TABS: 137.0000 ug | ORAL_TABLET | Freq: Every day | ORAL | Status: DC

## 2022-07-02 MED ORDER — POLYETHYLENE GLYCOL 3350 17 G PO PACK: 17.0000 g | PACK | Freq: Every day | ORAL | Status: DC | PRN

## 2022-07-02 MED ORDER — SODIUM CHLORIDE 0.9 % IV SOLN
12.5000 mg | Freq: Once | INTRAVENOUS | Status: AC
  Administered 2022-07-02: 12.5 mg via INTRAVENOUS
  Filled 2022-07-02: qty 0.5

## 2022-07-02 MED ORDER — ONDANSETRON HCL 4 MG PO TABS
4.0000 mg | ORAL_TABLET | Freq: Four times a day (QID) | ORAL | Status: DC | PRN
  Administered 2022-07-06 – 2022-07-07 (×2): 4 mg via ORAL
  Filled 2022-07-02 (×2): qty 1

## 2022-07-02 MED ORDER — LEVETIRACETAM 750 MG PO TABS
750.0000 mg | ORAL_TABLET | Freq: Three times a day (TID) | ORAL | Status: DC
  Administered 2022-07-03 – 2022-07-09 (×21): 750 mg via ORAL
  Filled 2022-07-02 (×23): qty 1

## 2022-07-02 MED ORDER — OXYCODONE HCL 5 MG PO TABS
10.0000 mg | ORAL_TABLET | Freq: Four times a day (QID) | ORAL | Status: DC | PRN
  Administered 2022-07-03 – 2022-07-07 (×16): 10 mg via ORAL
  Filled 2022-07-02 (×18): qty 2

## 2022-07-02 MED ORDER — ACETAMINOPHEN 650 MG RE SUPP: 650.0000 mg | Freq: Four times a day (QID) | RECTAL | Status: DC | PRN

## 2022-07-02 MED ORDER — SERTRALINE HCL 25 MG PO TABS
50.0000 mg | ORAL_TABLET | Freq: Every day | ORAL | Status: DC
  Administered 2022-07-03 – 2022-07-08 (×6): 50 mg via ORAL
  Filled 2022-07-02 (×6): qty 2

## 2022-07-02 MED ORDER — HYDROMORPHONE HCL 1 MG/ML IJ SOLN
1.0000 mg | Freq: Once | INTRAMUSCULAR | Status: AC
  Administered 2022-07-03: 1 mg via INTRAVENOUS
  Filled 2022-07-02: qty 1

## 2022-07-02 MED ORDER — ALBUTEROL SULFATE (2.5 MG/3ML) 0.083% IN NEBU: 2.5000 mg | INHALATION_SOLUTION | Freq: Four times a day (QID) | RESPIRATORY_TRACT | Status: DC | PRN

## 2022-07-02 MED ORDER — BUSPIRONE HCL 10 MG PO TABS
10.0000 mg | ORAL_TABLET | Freq: Two times a day (BID) | ORAL | Status: DC
  Administered 2022-07-03 – 2022-07-09 (×13): 10 mg via ORAL
  Filled 2022-07-02 (×13): qty 1

## 2022-07-02 MED ORDER — IOHEXOL 350 MG/ML SOLN
75.0000 mL | Freq: Once | INTRAVENOUS | Status: AC | PRN
  Administered 2022-07-02: 75 mL via INTRAVENOUS

## 2022-07-02 NOTE — ED Notes (Signed)
Spoke with lab Selena Batten) they will add on hcg

## 2022-07-02 NOTE — ED Triage Notes (Signed)
Pt BIB EMS due to n/v/d. Pt thinks she got food positioning from [employer]. Pt can not eat due to vomiting. Pt removing 4,kg of zofran, 500CC of fluid with EMS. Pt has hx of diabetes. Pt wearing fentanyl patch. Hx of chronic pain. 10/10 abd pain.

## 2022-07-02 NOTE — H&P (Signed)
History and Physical    Patient: [name] OJJ:[id number] DOB: 12/20/1970 DOA: 07/02/2022 DOS: the patient was seen and examined on 07/02/2022 PCP: Pcp, No  Patient coming from: {Point_of_Origin:[address]}  Chief Complaint:  Chief Complaint  Patient presents with   Emesis   HPI: [name] is a 52 y.o. female with medical history significant of ***  Review of Systems: {ROS_Text:26778} Past Medical History:  Diagnosis Date   Acute renal failure (ARF) ([facility]) 09/02/2012   Anemia    Anxiety    panic attacks   Back pain    Blood transfusion    Chronic heel ulcer ([facility])    Chronic kidney disease    Chronic pain syndrome    Depression    Diabetes mellitus    type II    DJD (degenerative joint disease)    Dysrhythmia    pt unsure what this was   Fibromyalgia    GERD (gastroesophageal reflux disease)    H/O: Bell's palsy 2011   Heart murmur    "slight one"   History of 2019 novel coronavirus disease (COVID-19) 03/25/2019   History of kidney stones    History of MRSA infection OF ULCER   Hypertension    no longer taken since 10-11 months per patient at preop phone call of 10/13/2017    Hypothyroidism    Hypoventilation associated with obesity syndrome (HCC)    Migraine    Non-healing non-surgical wound    Right hip, has Wound vac to hip.  Started as a skin tear.   Peripheral vascular disease ([facility])    PONV (postoperative nausea and vomiting)    can be slow to wake up after surgery   Postoperative abscess 10/27/2017   Right foot drop    Shortness of breath    with Activity   Sleep apnea    "study shows not bad enough for CPAP.", pt denies   Umbilical hernia    Past Surgical History:  Procedure Laterality Date   BACK SURGERY     for lumbar disc disease [facility]   BRAIN SURGERY  1970   Tumor removed- has steel plate   BRAIN SURGERY      Plating due to soft spot closing too early- age 29   BREAST LUMPECTOMY WITH NEEDLE LOCALIZATION Right 08/23/2013   Procedure: RIGHT  BREAST LUMPECTOMY WITH NEEDLE LOCALIZATION;  Surgeon: Adolph Pollack, MD;  Location: [facility];  Service: General;  Laterality: Right;   CHOLECYSTECTOMY  1984   EXCISION UMBILICAL NODULE N/A 10/16/2017   Procedure: EXCISION OF UMBILICUS;  Surgeon: Abigail Miyamoto, MD;  [facility];  Service: General;  Laterality: N/A;   EYE SURGERY     eye lift   I & D EXTREMITY  04/02/2011   Procedure: IRRIGATION AND DEBRIDEMENT EXTREMITY;  Surgeon: Kathryne Hitch;  [facility];  Service: Orthopedics;  Laterality: Right;  I&D right heel ulcer, placement of A-cell graft   INCISION AND DRAINAGE ABSCESS N/A 10/28/2017   Procedure: INCISION AND DRAINAGE UMBILICAL HERNIA ABSCESS;  Surgeon: Axel Filler, MD;  Location[facility];  Service: General;  Laterality: N/A;   INCISION AND DRAINAGE OF WOUND  08/29/2011   Procedure: IRRIGATION AND DEBRIDEMENT WOUND;  Surgeon: Wayland Denis, DO;  Location[facility];  Service: Plastics;  Laterality: Right;   INSERTION OF MESH N/A 10/16/2017   Procedure: INSERTION OF MESH;  Surgeon: Abigail Miyamoto, MD;  [facility];  Service: General;  Laterality: N/A;   LITHOTRIPSY  [id number]   right elbow     UMBILICAL HERNIA REPAIR N/A 10/16/2017   Procedure: UMBILICAL HERNIA REPAIR WITH MESH;  Surgeon: Abigail MiyamotoBlackman, Douglas, MD;  Location[facility];  Service: General;  Laterality: N/A;   Social History:  reports that she has never smoked. She has never used smokeless tobacco. She reports that she does not drink alcohol and does not use drugs.  Allergies  Allergen Reactions   Penicillins Hives and Other (See Comments)    HAS PT DEVELOPED SEVERE RASH INVOLVING MUCUS MEMBRANES or SKIN NECROSIS: #  #  YES  #  # PATIENT HAS HAD A PCN REACTION THAT REQUIRED HOSPITALIZATION:  #  #  YES  #  #   Tolerates amoxicillin on multiple occasions per Dr. Jacinto HalimIngram's note 11/01/17.  TDD.   Clindamycin/Lincomycin Hives, Dermatitis and Rash   Nsaids Other (See Comments)    Avoid due to kidney  failure caused by celebrex    Sulfa Antibiotics Hives   Dulaglutide Other (See Comments)    unknown   No [employer] Food Allergies Hives    Chicken   Versed [Midazolam] Nausea And Vomiting    Pt had medication on October 16 2017 with no issues    Family History  Problem Relation Age of Onset   Diabetes type II Father    Heart attack Father    Peripheral vascular disease Father    Diabetes type II Mother    Anesthesia problems Mother    Heart attack Mother    Peripheral vascular disease Mother    Hypertension Mother     Prior to Admission medications   Medication Sig Start Date End Date Taking? Authorizing Provider  acetaminophen (TYLENOL) 500 MG tablet Take 500 mg by mouth every 6 (six) hours as needed for moderate pain.    [provider]  ASPIRIN LOW DOSE 81 MG EC tablet Take 81 mg by mouth daily. 01/19/21   [provider]  atorvastatin (LIPITOR) 20 MG tablet Take 20 mg by mouth daily.    [provider]  Biotin 0272510000 MCG TABS Take 10,000 mcg by mouth daily.    [provider]  busPIRone (BUSPAR) 7.5 MG tablet Take 1 tablet (7.5 mg total) by mouth 3 (three) times daily. 02/28/22   Thresa RossAkhtar, Nadeem, MD  cetirizine (ZYRTEC) 10 MG tablet Take 10 mg by mouth daily.    [provider]  Cholecalciferol (VITAMIN D3) 50 MCG (2000 UT) TABS Take 1 tablet by mouth daily. 01/19/21   [provider]  Cyanocobalamin (VITAMIN DEFICIENCY SYSTEM-B12) 1000 MCG/ML KIT Inject 1,000 mcg as directed every 30 (thirty) days.    [provider]  DULoxetine (CYMBALTA) 30 MG capsule Take 1 capsule (30 mg total) by mouth daily. 01/31/22   Thresa RossAkhtar, Nadeem, MD  DULoxetine (CYMBALTA) 60 MG capsule Take 1 capsule (60 mg total) by mouth daily. 09/26/21   [place] PhoLynn, Catherine, MD  fentaNYL (DURAGESIC) 50 MCG/HR Place 1 patch onto the skin every 3 (three) days. 04/04/20   Mikhail, Nita SellsMaryann, DO  gabapentin (NEURONTIN) 600 MG tablet Take 0.5 tablets (300 mg total) by  mouth at bedtime. 09/26/21   [place] PhoLynn, Catherine, MD  insulin aspart protamine - aspart (NOVOLOG MIX 70/30 FLEXPEN) (70-30) 100 UNIT/ML FlexPen Inject 40 Units into the skin 2 (two) times daily with a meal. 09/26/21 10/26/21  [place] PhoLynn, Catherine, MD  levETIRAcetam (KEPPRA) 750 MG tablet Take 750 mg by mouth 2 (two) times daily. 03/04/19   [provider]  levothyroxine (SYNTHROID) 137 MCG  tablet Take 137 mcg by mouth daily. 09/10/21   [provider]  lidocaine (LIDODERM) 5 % Place 3 patches onto the skin See admin instructions. Place 3 patches daily to affected areas and replace every 24 hours 05/30/17   [provider]  midodrine (PROAMATINE) 10 MG tablet Take 1 tablet (10 mg total) by mouth 3 (three) times daily with meals. 09/25/21   Dameron, Nolberto Hanlon, DO  NEXIUM 40 MG capsule Take 40 mg by mouth daily. 02/06/21   [provider]  nystatin (MYCOSTATIN/NYSTOP) powder Apply 1 application  topically 3 (three) times daily as needed (skin irritation). 04/02/21   [provider]  omega-3 acid ethyl esters (LOVAZA) 1 g capsule Take 1 capsule by mouth 2 (two) times daily. 09/07/20   [provider]  Oxycodone HCl 10 MG TABS Take 1 tablet (10 mg total) by mouth every 6 (six) hours as needed (for pain). 12/30/20   Regalado, Belkys A, MD  OZEMPIC, 0.25 OR 0.5 MG/DOSE, 2 MG/3ML SOPN Inject 0.25 mg into the skin once a week. 08/01/21   [provider]  SYMBICORT 160-4.5 MCG/ACT inhaler Inhale 2 puffs into the lungs 2 (two) times daily as needed for shortness of breath or wheezing. 02/22/21   [provider]  triamcinolone cream (KENALOG) 0.1 % Apply 1 application topically 2 (two) times daily as needed (skin rash). 12/04/20   [provider]    Physical Exam: Vitals:   07/02/22 2015 07/02/22 2100 07/02/22 2115 07/02/22 2130  BP: (!) 136/96 101/64 (!) 137/99 129/89  Pulse: (!) 112 (!) 111 (!) 112 (!) 113  Resp: (!) 27 (!) 53 (!) 23 (!) 24  Temp:       TempSrc:      SpO2: 93% 100% 95% 96%  Weight:      Height:       *** Data Reviewed: {Tip this will not be part of the note when signed- Document your independent interpretation of telemetry tracing, EKG, lab, Radiology test or any other diagnostic tests. Add any new diagnostic test ordered today. (Optional):26781} {Results:26384}  Assessment and Plan: No notes have been filed under this hospital service. Service: Hospitalist     Advance Care Planning:   Code Status: Prior ***  Consults: ***  Family Communication: ***  Severity of Illness: {Observation/Inpatient:21159}  Author: Katha Cabal, DO 07/02/2022 10:18 PM  For on call review [email or web address].

## 2022-07-02 NOTE — ED Notes (Signed)
Report given to Ezequiel Essex, RN of [phone]

## 2022-07-02 NOTE — ED Provider Notes (Signed)
[name]Y DE[facility]MENT AT [facility] Provider Note   CSN: [id number] Arrival date & time: 07/02/22  1448     History  Chief Complaint  Patient presents with   Emesis    ROCSI HAZELBAKER is a 52 y.o. female with a past medical history significant for hypothyroidism, chronic pain syndrome, type 2 diabetes, fibromyalgia, depression who presents to the ED due to nausea, vomiting, diarrhea that started around 1:30 AM this morning.  She admits to numerous episodes of nonbloody, nonbilious emesis and nonbloody diarrhea. patient states she had [employer] pizza prior to onset of symptoms.  No other individuals that ate the pizza are sick with similar symptoms.  Denies fever and chills.  Admits to diffuse abdominal pain.  No urinary symptoms.  Denies vaginal symptoms.  Also endorses a headache.  History of diabetes with glucose typically running in the 100s.  Did not take her insulin today.  Patient was in her normal state of health prior to 1:30 AM this morning.  No recent antibiotics.  Denies ingestion of undercooked foods.  History obtained from patient and past medical records. No interpreter used during encounter.       Home Medications Prior to Admission medications   Medication Sig Start Date End Date Taking? Authorizing Provider  acetaminophen (TYLENOL) 500 MG tablet Take 500 mg by mouth every 6 (six) hours as needed for moderate pain.    [provider]  ASPIRIN LOW DOSE 81 MG EC tablet Take 81 mg by mouth daily. 01/19/21   [provider]  atorvastatin (LIPITOR) 20 MG tablet Take 20 mg by mouth daily.    [provider]  Biotin 86578 MCG TABS Take 10,000 mcg by mouth daily.    [provider]  busPIRone (BUSPAR) 7.5 MG tablet Take 1 tablet (7.5 mg total) by mouth 3 (three) times daily. 02/28/22   Thresa Ross, MD  cetirizine (ZYRTEC) 10 MG tablet Take 10 mg by mouth daily.    [provider]  Cholecalciferol (VITAMIN D3)  50 MCG (2000 UT) TABS Take 1 tablet by mouth daily. 01/19/21   [provider]  Cyanocobalamin (VITAMIN DEFICIENCY SYSTEM-B12) 1000 MCG/ML KIT Inject 1,000 mcg as directed every 30 (thirty) days.    [provider]  DULoxetine (CYMBALTA) 30 MG capsule Take 1 capsule (30 mg total) by mouth daily. 01/31/22   Thresa Ross, MD  DULoxetine (CYMBALTA) 60 MG capsule Take 1 capsule (60 mg total) by mouth daily. 09/26/21   Fayette Pho, MD  fentaNYL (DURAGESIC) 50 MCG/HR Place 1 patch onto the skin every 3 (three) days. 04/04/20   Mikhail, Nita Sells, DO  gabapentin (NEURONTIN) 600 MG tablet Take 0.5 tablets (300 mg total) by mouth at bedtime. 09/26/21   Fayette Pho, MD  insulin aspart protamine - aspart (NOVOLOG MIX 70/30 FLEXPEN) (70-30) 100 UNIT/ML FlexPen Inject 40 Units into the skin 2 (two) times daily with a meal. 09/26/21 10/26/21  Fayette Pho, MD  levETIRAcetam (KEPPRA) 750 MG tablet Take 750 mg by mouth 2 (two) times daily. 03/04/19   [provider]  levothyroxine (SYNTHROID) 137 MCG tablet Take 137 mcg by mouth daily. 09/10/21   [provider]  lidocaine (LIDODERM) 5 % Place 3 patches onto the skin See admin instructions. Place 3 patches daily to affected areas and replace every 24 hours 05/30/17   [provider]  midodrine (PROAMATINE) 10 MG tablet Take 1 tablet (10 mg total) by mouth 3 (three) times daily with meals. 09/25/21  Dameron, Marisa, DO  NEXIUM 40 MG capsule Take 40 mg by mouth daily. 02/06/21   [provider]  nystatin (MYCOSTATIN/NYSTOP) powder Apply 1 application  topically 3 (three) times daily as needed (skin irritation). 04/02/21   [provider]  omega-3 acid ethyl esters (LOVAZA) 1 g capsule Take 1 capsule by mouth 2 (two) times daily. 09/07/20   [provider]  Oxycodone HCl 10 MG TABS Take 1 tablet (10 mg total) by mouth every 6 (six) hours as needed (for pain). 12/30/20   Regalado, Belkys A, MD  OZEMPIC,  0.25 OR 0.5 MG/DOSE, 2 MG/3ML SOPN Inject 0.25 mg into the skin once a week. 08/01/21   [provider]  SYMBICORT 160-4.5 MCG/ACT inhaler Inhale 2 puffs into the lungs 2 (two) times daily as needed for shortness of breath or wheezing. 02/22/21   [provider]  triamcinolone cream (KENALOG) 0.1 % Apply 1 application topically 2 (two) times daily as needed (skin rash). 12/04/20   [provider]      Allergies    Penicillins, Clindamycin/lincomycin, Nsaids, Sulfa antibiotics, Dulaglutide, No healthtouch food allergies, and Versed [midazolam]    Review of Systems   Review of Systems  Constitutional:  Negative for chills and fever.  Respiratory:  Negative for shortness of breath.   Cardiovascular:  Negative for chest pain.  Gastrointestinal:  Positive for abdominal pain, diarrhea, nausea and vomiting.  Genitourinary:  Negative for dysuria.    Physical Exam Updated Vital Signs BP 129/89   Pulse (!) 113   Temp 98 F (36.7 C) (Oral)   Resp (!) 24   Ht 5\' 5"  (1.651 m)   Wt 116.6 kg   SpO2 96%   BMI 42.77 kg/m  Physical Exam Vitals and nursing note reviewed.  Constitutional:      General: She is not in acute distress.    Appearance: She is ill-appearing.     Comments: Appears uncomfortable in bed. Stool all over bed during initial evaluation  HENT:     Head: Normocephalic.  Eyes:     Pupils: Pupils are equal, round, and reactive to light.  Cardiovascular:     Rate and Rhythm: Normal rate and regular rhythm.     Pulses: Normal pulses.     Heart sounds: Normal heart sounds. No murmur heard.    No friction rub. No gallop.  Pulmonary:     Effort: Pulmonary effort is normal.     Breath sounds: Normal breath sounds.  Abdominal:     General: Abdomen is flat. There is no distension.     Palpations: Abdomen is soft.     Tenderness: There is abdominal tenderness. There is no guarding or rebound.  Musculoskeletal:        General: Normal range of motion.      Cervical back: Neck supple.  Skin:    General: Skin is warm and dry.  Neurological:     General: No focal deficit present.     Mental Status: She is alert.  Psychiatric:        Mood and Affect: Mood normal.        Behavior: Behavior normal.     ED Results / Procedures / Treatments   Labs (all labs ordered are listed, but only abnormal results are displayed) Labs Reviewed  CBC WITH DIFFERENTIAL/PLATELET - Abnormal; Notable for the following components:      Result Value   WBC 10.8 (*)    Hemoglobin 16.3 (*)    HCT  46.9 (*)    Neutro Abs 9.2 (*)    All other components within normal limits  URINALYSIS, ROUTINE W REFLEX MICROSCOPIC - Abnormal; Notable for the following components:   Glucose, UA >=500 (*)    All other components within normal limits  BETA-HYDROXYBUTYRIC ACID - Abnormal; Notable for the following components:   Beta-Hydroxybutyric Acid 0.63 (*)    All other components within normal limits  COMPREHENSIVE METABOLIC PANEL - Abnormal; Notable for the following components:   Sodium 131 (*)    CO2 17 (*)    Glucose, Bld 461 (*)    BUN 29 (*)    Creatinine, Ser 1.09 (*)    AST 51 (*)    ALT 57 (*)    Alkaline Phosphatase 129 (*)    Anion gap 16 (*)    All other components within normal limits  HCG, QUANTITATIVE, PREGNANCY - Abnormal; Notable for the following components:   hCG, Beta Chain, Quant, S 8 (*)    All other components within normal limits  I-STAT BETA HCG BLOOD, ED ([facility], [facility], [facility] ONLY) - Abnormal; Notable for the following components:   I-stat hCG, quantitative 7.2 (*)    All other components within normal limits  CBG MONITORING, ED - Abnormal; Notable for the following components:   Glucose-Capillary 475 (*)    All other components within normal limits  I-STAT VENOUS BLOOD GAS, ED - Abnormal; Notable for the following components:   pCO2, Ven 36.7 (*)    Bicarbonate 19.9 (*)    TCO2 21 (*)    Acid-base deficit 5.0 (*)    Sodium 124 (*)    Potassium  >8.5 (*)    Calcium, Ion 0.97 (*)    HCT 49.0 (*)    Hemoglobin 16.7 (*)    All other components within normal limits  I-STAT CHEM 8, ED - Abnormal; Notable for the following components:   Sodium 132 (*)    BUN 30 (*)    Glucose, Bld 461 (*)    Calcium, Ion 1.11 (*)    TCO2 19 (*)    Hemoglobin 17.0 (*)    HCT 50.0 (*)    All other components within normal limits  CBG MONITORING, ED - Abnormal; Notable for the following components:   Glucose-Capillary 315 (*)    All other components within normal limits  RESP PANEL BY RT-PCR (RSV, FLU A&B, COVID)  RVPGX2  LIPASE, BLOOD  BASIC METABOLIC PANEL  POC URINE PREG, ED    EKG EKG Interpretation  Date/Time:  Tuesday July 02 2022 16:54:13 EDT Ventricular Rate:  91 PR Interval:  152 QRS Duration: 84 QT Interval:  356 QTC Calculation: 437 R Axis:   236 Text Interpretation: Unusual P axis, possible ectopic atrial rhythm Low voltage QRS Anterolateral infarct , age undetermined Abnormal ECG When compared with ECG of 18-Sep-2021 13:54, No significant change since last tracing Confirmed by Benjiman Core [phone]) on 07/02/2022 5:40:03 PM  Radiology CT ABDOMEN PELVIS W CONTRAST  Result Date: 07/02/2022 CLINICAL DATA:  Vomiting and abdominal pain. EXAM: CT ABDOMEN AND PELVIS WITH CONTRAST TECHNIQUE: Multidetector CT imaging of the abdomen and pelvis was performed using the standard protocol following bolus administration of intravenous contrast. RADIATION DOSE REDUCTION: This exam was performed according to the departmental dose-optimization program which includes automated exposure control, adjustment of the mA and/or kV according to patient size and/or use of iterative reconstruction technique. CONTRAST:  69mL OMNIPAQUE IOHEXOL 350 MG/ML SOLN COMPARISON:  May 14, 2021 FINDINGS: Lower  chest: Mild atelectasis is seen within the posterior aspect of the right lung base. Hepatobiliary: No focal liver abnormality is seen. Status post  cholecystectomy. No biliary dilatation. Pancreas: Unremarkable. No pancreatic ductal dilatation or surrounding inflammatory changes. Spleen: Normal in size without focal abnormality. Adrenals/Urinary Tract: Adrenal glands are unremarkable. Kidneys are normal in size, without renal calculi or hydronephrosis. A 1.3 cm simple cyst is seen within the posterior aspect of the upper pole of the left kidney. Bladder is unremarkable. Stomach/Bowel: Stomach is within normal limits. Appendix appears normal. No evidence of bowel wall thickening, distention, or inflammatory changes. Vascular/Lymphatic: No significant vascular findings are present. Stable right upper quadrant and mid line upper abdomen lymphadenopathy is noted. Reproductive: A properly positioned IUD is seen within an otherwise normal appearing uterus. The bilateral adnexa are unremarkable. Other: No abdominal wall hernia or abnormality. No abdominopelvic ascites. Musculoskeletal: A stable 7.8 cm x 3.2 cm, likely benign mixed attenuation, partially calcified mass is seen within the subcutaneous soft tissues of the posterior pelvic wall, to the left of midline. No acute osseous abnormalities are identified. IMPRESSION: 1. No acute or active process within the abdomen or pelvis. 2. Evidence of prior cholecystectomy. 3. Small simple cyst within the left kidney. No follow-up imaging is recommended. This recommendation follows ACR consensus guidelines: Management of the Incidental Renal Mass on CT: A White Paper of the ACR Incidental Findings Committee. J Am Coll Radiol [phone]. Electronically Signed   By: Aram Candela M.D.   On: 07/02/2022 21:15    Procedures Procedures    Medications Ordered in ED Medications  sodium chloride 0.9 % bolus 1,000 mL (0 mLs Intravenous Stopped 07/02/22 1953)  morphine (PF) 4 MG/ML injection 4 mg (4 mg Intravenous Given 07/02/22 1541)  HYDROmorphone (DILAUDID) injection 1 mg (1 mg Intravenous Given 07/02/22 1738)   promethazine (PHENERGAN) 12.5 mg in sodium chloride 0.9 % 50 mL IVPB (0 mg Intravenous Stopped 07/02/22 1854)  insulin aspart (novoLOG) injection 10 Units (10 Units Subcutaneous Given 07/02/22 1738)  sodium chloride 0.9 % bolus 1,000 mL (1,000 mLs Intravenous New Bag/Given 07/02/22 1956)  iohexol (OMNIPAQUE) 350 MG/ML injection 75 mL (75 mLs Intravenous Contrast Given 07/02/22 2047)    ED Course/ Medical Decision Making/ A&P Clinical Course as of 07/02/22 2158  Tue Jul 02, 2022  1617 VBG with hyperkalemia, likely hemolyzed. EKG ordered. Advised RN to place patient on monitor. [CA]  1730 Reassessed patient, still has a significant amount of abdominal pain. CT abdomen ordered. IV phenergan and SubQ insulin ordered. [CA]  1731 Glucose(!): 461 [CA]  1739 Potassium: 4.0 Chem 8 with normal potassium. No evidence of hyperkalemia on EKG. [CA]  1848 Reassessed patient at bedside.  Patient notes improvement in pain after Dilaudid.  Awaiting CT abdomen. [CA]  2137 Alkaline Phosphatase(!): 129 [CA]  2137 ALT(!): 57 [CA]  2137 AST(!): 51 [CA]    Clinical Course User Index [CA] Mannie Stabile, PA-C                             Medical Decision Making Amount and/or Complexity of Data Reviewed External Data Reviewed: notes. Labs: ordered. Decision-making details documented in ED Course. Radiology: ordered and independent interpretation performed. Decision-making details documented in ED Course. ECG/medicine tests: ordered and independent interpretation performed. Decision-making details documented in ED Course.  Risk OTC drugs. Prescription drug management. Decision regarding hospitalization.   This patient presents to the ED for concern of N,V.D, this involves  an extensive number of treatment options, and is a complaint that carries with it a high risk of complications and morbidity.  The differential diagnosis includes DKA, gastroenteritis, viral process, diverticulitis, etc  52 year old  female presents to the ED due to nausea, vomiting, diarrhea that started early this morning.  History of diabetes and chronic pain syndrome.  Did not take her insulin today.  Notes glucose typically runs in the 100s.  Denies recent antibiotics or ingestion of undercooked foods.  Patient notes she had [employer] which she believes caused food poisoning.  No other individuals sick with similar symptoms.  No fever or chills.  Upon arrival, patient afebrile, not tachycardic or hypoxic.  Patient appears uncomfortable in bed.  Diffuse abdominal tenderness with voluntary guarding.  Patient covered in stool during initial evaluation.  [employer] labs ordered.  Patient's CBG elevated at 475.  DKA labs ordered.  IV fluids started.  CBC significant for leukocytosis at 10.8 and elevated hemoglobin at 16.3.  Hemoconcentration?.  I-STAT hCG mildly elevated.  Quantitative hCG ordered.  CMP significant for hyponatremia 131, hyperglycemia at 461 and mild anion gap at 16.  VBG with normal pH.  Possible starvation ketosis from GI loss?  Given insulin and IV fluids.  8:06 PM reassessed patient at bedside.  Patient admits to continued abdominal pain however, resting comfortably in bed.  Awaiting urine test to rule out pregnancy for CT scan.  Patient has IUD.  Denies concerns for pregnancy. Another liter of IVFs given.   CT abdomen negative for any acute abnormalities.  Does demonstrate a small simple cyst within the left kidney. Patient will require admission due to hyperglycemia and intractable N/V and abdominal pain. Discussed with Dr. Rubin PayorPickering who agrees with assessment and plan.   9:56 PM Discussed with Dr. Rachael DarbyBrimage with [facility] who agrees to admit        Final Clinical Impression(s) / ED Diagnoses Final diagnoses:  Nausea vomiting and diarrhea  Hyperglycemia    Rx / DC Orders ED Discharge Orders     None         Jesusita Okaberman, Shanteria Laye C, PA-C 07/02/22 2338    Benjiman CorePickering, Nathan, MD 07/03/22 2332

## 2022-07-03 ENCOUNTER — Encounter (HOSPITAL_COMMUNITY): Payer: Self-pay | Admitting: Family Medicine

## 2022-07-03 DIAGNOSIS — D179 Benign lipomatous neoplasm, unspecified: Secondary | ICD-10-CM | POA: Diagnosis not present

## 2022-07-03 DIAGNOSIS — G894 Chronic pain syndrome: Secondary | ICD-10-CM | POA: Diagnosis not present

## 2022-07-03 DIAGNOSIS — R569 Unspecified convulsions: Secondary | ICD-10-CM

## 2022-07-03 DIAGNOSIS — F4322 Adjustment disorder with anxiety: Secondary | ICD-10-CM | POA: Diagnosis not present

## 2022-07-03 DIAGNOSIS — F339 Major depressive disorder, recurrent, unspecified: Secondary | ICD-10-CM | POA: Diagnosis not present

## 2022-07-03 DIAGNOSIS — R197 Diarrhea, unspecified: Secondary | ICD-10-CM

## 2022-07-03 DIAGNOSIS — Z6841 Body Mass Index (BMI) 40.0 and over, adult: Secondary | ICD-10-CM | POA: Diagnosis not present

## 2022-07-03 DIAGNOSIS — M797 Fibromyalgia: Secondary | ICD-10-CM | POA: Diagnosis not present

## 2022-07-03 DIAGNOSIS — R739 Hyperglycemia, unspecified: Secondary | ICD-10-CM | POA: Diagnosis not present

## 2022-07-03 DIAGNOSIS — E039 Hypothyroidism, unspecified: Secondary | ICD-10-CM | POA: Diagnosis not present

## 2022-07-03 DIAGNOSIS — I10 Essential (primary) hypertension: Secondary | ICD-10-CM | POA: Diagnosis not present

## 2022-07-03 DIAGNOSIS — Z79899 Other long term (current) drug therapy: Secondary | ICD-10-CM | POA: Diagnosis not present

## 2022-07-03 DIAGNOSIS — E662 Morbid (severe) obesity with alveolar hypoventilation: Secondary | ICD-10-CM | POA: Diagnosis not present

## 2022-07-03 DIAGNOSIS — I951 Orthostatic hypotension: Secondary | ICD-10-CM | POA: Diagnosis not present

## 2022-07-03 DIAGNOSIS — G8929 Other chronic pain: Secondary | ICD-10-CM | POA: Diagnosis not present

## 2022-07-03 DIAGNOSIS — Z634 Disappearance and death of family member: Secondary | ICD-10-CM | POA: Diagnosis not present

## 2022-07-03 DIAGNOSIS — E1110 Type 2 diabetes mellitus with ketoacidosis without coma: Secondary | ICD-10-CM | POA: Diagnosis not present

## 2022-07-03 DIAGNOSIS — E1165 Type 2 diabetes mellitus with hyperglycemia: Secondary | ICD-10-CM | POA: Diagnosis not present

## 2022-07-03 DIAGNOSIS — E876 Hypokalemia: Secondary | ICD-10-CM | POA: Diagnosis not present

## 2022-07-03 DIAGNOSIS — Z1152 Encounter for screening for COVID-19: Secondary | ICD-10-CM | POA: Diagnosis not present

## 2022-07-03 DIAGNOSIS — F418 Other specified anxiety disorders: Secondary | ICD-10-CM | POA: Diagnosis not present

## 2022-07-03 DIAGNOSIS — H8110 Benign paroxysmal vertigo, unspecified ear: Secondary | ICD-10-CM | POA: Diagnosis not present

## 2022-07-03 DIAGNOSIS — Z79890 Hormone replacement therapy (postmenopausal): Secondary | ICD-10-CM | POA: Diagnosis not present

## 2022-07-03 DIAGNOSIS — E1151 Type 2 diabetes mellitus with diabetic peripheral angiopathy without gangrene: Secondary | ICD-10-CM | POA: Diagnosis not present

## 2022-07-03 DIAGNOSIS — G40909 Epilepsy, unspecified, not intractable, without status epilepticus: Secondary | ICD-10-CM | POA: Diagnosis not present

## 2022-07-03 DIAGNOSIS — Z8616 Personal history of COVID-19: Secondary | ICD-10-CM | POA: Diagnosis not present

## 2022-07-03 DIAGNOSIS — R45851 Suicidal ideations: Secondary | ICD-10-CM | POA: Diagnosis not present

## 2022-07-03 DIAGNOSIS — A059 Bacterial foodborne intoxication, unspecified: Secondary | ICD-10-CM | POA: Diagnosis not present

## 2022-07-03 DIAGNOSIS — R112 Nausea with vomiting, unspecified: Secondary | ICD-10-CM | POA: Diagnosis not present

## 2022-07-03 LAB — COMPREHENSIVE METABOLIC PANEL
ALT: 45 U/L — ABNORMAL HIGH (ref 0–44)
AST: 32 U/L (ref 15–41)
Albumin: 3.4 g/dL — ABNORMAL LOW (ref 3.5–5.0)
Alkaline Phosphatase: 104 U/L (ref 38–126)
Anion gap: 13 (ref 5–15)
BUN: 24 mg/dL — ABNORMAL HIGH (ref 6–20)
CO2: 15 mmol/L — ABNORMAL LOW (ref 22–32)
Calcium: 8.7 mg/dL — ABNORMAL LOW (ref 8.9–10.3)
Chloride: 104 mmol/L (ref 98–111)
Creatinine, Ser: 1.01 mg/dL — ABNORMAL HIGH (ref 0.44–1.00)
GFR, Estimated: >60 mL/min (ref >60)
Glucose, Bld: 304 mg/dL — ABNORMAL HIGH (ref 70–99)
Potassium: 3.8 mmol/L (ref 3.5–5.1)
Sodium: 132 mmol/L — ABNORMAL LOW (ref 135–145)
Total Bilirubin: 0.7 mg/dL (ref 0.3–1.2)
Total Protein: 6.6 g/dL (ref 6.5–8.1)

## 2022-07-03 LAB — GLUCOSE, CAPILLARY
Glucose-Capillary: 324 mg/dL — ABNORMAL HIGH (ref 70–99)
Glucose-Capillary: 344 mg/dL — ABNORMAL HIGH (ref 70–99)
Glucose-Capillary: 219 mg/dL — ABNORMAL HIGH (ref 70–99)

## 2022-07-03 LAB — C DIFFICILE QUICK SCREEN W PCR REFLEX
C Diff antigen: NEGATIVE
C Diff interpretation: NOT DETECTED
C Diff toxin: NEGATIVE

## 2022-07-03 LAB — CBC
HCT: 46.6 % — ABNORMAL HIGH (ref 36.0–46.0)
Hemoglobin: 16 g/dL — ABNORMAL HIGH (ref 12.0–15.0)
MCH: 31.6 pg (ref 26.0–34.0)
MCHC: 34.3 g/dL (ref 30.0–36.0)
MCV: 91.9 fL (ref 80.0–100.0)
Platelets: 188 10*3/uL (ref 150–400)
RBC: 5.07 MIL/uL (ref 3.87–5.11)
RDW: 12.7 % (ref 11.5–15.5)
WBC: 6.5 10*3/uL (ref 4.0–10.5)
nRBC: 0 % (ref 0.0–0.2)

## 2022-07-03 LAB — BETA-HYDROXYBUTYRIC ACID: Beta-Hydroxybutyric Acid: 0.33 mmol/L — ABNORMAL HIGH (ref 0.05–0.27)

## 2022-07-03 LAB — HIV ANTIBODY (ROUTINE TESTING W REFLEX): HIV Screen 4th Generation wRfx: NONREACTIVE

## 2022-07-03 LAB — BASIC METABOLIC PANEL
Anion gap: 8 (ref 5–15)
BUN: 23 mg/dL — ABNORMAL HIGH (ref 6–20)
CO2: 20 mmol/L — ABNORMAL LOW (ref 22–32)
Calcium: 8.5 mg/dL — ABNORMAL LOW (ref 8.9–10.3)
Chloride: 101 mmol/L (ref 98–111)
Creatinine, Ser: 1.03 mg/dL — ABNORMAL HIGH (ref 0.44–1.00)
GFR, Estimated: >60 mL/min (ref >60)
Glucose, Bld: 350 mg/dL — ABNORMAL HIGH (ref 70–99)
Potassium: 3.8 mmol/L (ref 3.5–5.1)
Sodium: 129 mmol/L — ABNORMAL LOW (ref 135–145)

## 2022-07-03 LAB — HEMOGLOBIN A1C
Hgb A1c MFr Bld: 10 % — ABNORMAL HIGH (ref 4.8–5.6)
Mean Plasma Glucose: 240.3 mg/dL

## 2022-07-03 MED ORDER — PANTOPRAZOLE SODIUM 40 MG IV SOLR
40.0000 mg | INTRAVENOUS | Status: DC
  Administered 2022-07-03: 40 mg via INTRAVENOUS
  Filled 2022-07-03: qty 10

## 2022-07-03 MED ORDER — LORATADINE 10 MG PO TABS
10.0000 mg | ORAL_TABLET | Freq: Every day | ORAL | Status: DC
  Administered 2022-07-03 – 2022-07-09 (×7): 10 mg via ORAL
  Filled 2022-07-03 (×7): qty 1

## 2022-07-03 MED ORDER — SODIUM CHLORIDE 0.9 % IV SOLN: INTRAVENOUS | Status: AC

## 2022-07-03 MED ORDER — CARISOPRODOL 350 MG PO TABS
350.0000 mg | ORAL_TABLET | Freq: Four times a day (QID) | ORAL | Status: DC
  Administered 2022-07-03 – 2022-07-09 (×21): 350 mg via ORAL
  Filled 2022-07-03 (×24): qty 1

## 2022-07-03 MED ORDER — INSULIN ASPART 100 UNIT/ML IJ SOLN
0.0000 [IU] | INTRAMUSCULAR | Status: DC
  Administered 2022-07-03 (×2): 11 [IU] via SUBCUTANEOUS
  Administered 2022-07-03: 5 [IU] via SUBCUTANEOUS
  Administered 2022-07-04: 3 [IU] via SUBCUTANEOUS
  Administered 2022-07-04: 2 [IU] via SUBCUTANEOUS
  Administered 2022-07-04: 3 [IU] via SUBCUTANEOUS
  Administered 2022-07-04: 5 [IU] via SUBCUTANEOUS
  Administered 2022-07-04: 3 [IU] via SUBCUTANEOUS
  Administered 2022-07-04: 11 [IU] via SUBCUTANEOUS
  Administered 2022-07-05: 5 [IU] via SUBCUTANEOUS
  Administered 2022-07-05: 3 [IU] via SUBCUTANEOUS

## 2022-07-03 MED ORDER — SODIUM CHLORIDE 0.9 % IV BOLUS
1000.0000 mL | Freq: Once | INTRAVENOUS | Status: AC
  Administered 2022-07-03: 1000 mL via INTRAVENOUS

## 2022-07-03 MED ORDER — ELETRIPTAN HYDROBROMIDE 20 MG PO TABS
40.0000 mg | ORAL_TABLET | ORAL | Status: DC | PRN
  Administered 2022-07-06 – 2022-07-09 (×3): 40 mg via ORAL
  Filled 2022-07-03 (×4): qty 2

## 2022-07-03 MED ORDER — INSULIN GLARGINE-YFGN 100 UNIT/ML ~~LOC~~ SOLN
20.0000 [IU] | Freq: Every day | SUBCUTANEOUS | Status: DC
  Administered 2022-07-03: 20 [IU] via SUBCUTANEOUS
  Filled 2022-07-03 (×2): qty 0.2

## 2022-07-03 NOTE — Plan of Care (Signed)
  Problem: Activity: Goal: Risk for activity intolerance will decrease Outcome: Progressing   Problem: Nutrition: Goal: Adequate nutrition will be maintained Outcome: Progressing   Problem: Pain Managment: Goal: General experience of comfort will improve Outcome: Progressing   Problem: Safety: Goal: Ability to remain free from injury will improve Outcome: Progressing   

## 2022-07-03 NOTE — Progress Notes (Signed)
PROGRESS NOTE    [name]  GOT:[id number] DOB: April 22, 1970 DOA: 07/02/2022 PCP: Pcp, No    Chief Complaint  Patient presents with   Emesis    Brief Narrative:  Patient is a 52 year old female history of type 2 diabetes, chronic pain, hypertension, hypothyroidism, migraines, OA, anxiety depression, GERD presented with persistent nausea vomiting and diarrhea after eating a [employer] pizza with multiple loose stools and inability to keep anything down.  Patient's boyfriend also ate the same pizza also had diarrhea but not as severe as the patient.  Patient seen in the ED noted to be tachycardic, intermittent tachypnea with some hypertension.  Noted to have a blood glucose of 475, COVID-19 PCR, RSV PCR negative.  Patient noted on initial presentation to have a bicarb of 19, anion gap of 16, urine pregnancy negative, CT abdomen and pelvis with no acute abnormalities.  Patient placed on IV antiemetics, IV fluids and admitted.   Assessment & Plan:   Principal Problem:   Intractable vomiting with nausea Active Problems:   Poorly controlled type 2 diabetes mellitus (HCC)   Chronic pain   Depression with anxiety   Seizure   Hyperglycemia   #1. intractable nausea vomiting and diarrhea -Likely secondary to foodborne gastroenteritis versus early onset DKA. -Lipase levels not concerning for pancreatitis. -Patient still with multiple bouts of diarrhea, nausea and vomiting slowly improving. -Check stool studies, C. difficile PCR, GI pathogen panel. -IV fluid bolus, placed on maintenance IV fluids. -IV antiemetics, supportive care. -Continue clear liquids through today and likely advance to a full liquid diet tomorrow.  2.  Type 2 diabetes with hyperglycemia -Patient with noted to admitting physician unable to eat anything, patient noted to be hyperglycemic initially with elevated anion gap and mildly elevated beta hydroxybutyrate felt likely secondary to starvation ketosis versus early  onset DKA. -Repeat labs this morning with a bicarb of 20, glucose of 350, BUN of 23, creatinine of 1.03.  Anion gap of 8. -Hemoglobin A1c 10.0. -IV fluid bolus. -Place on maintenance IV fluids. -Patient on 70/30 40 units twice daily is what is prescribed however per med rec patient taking 60 units 3 times daily, as well as Ozempic weekly. -Place on Semglee 20 units twice daily as well as NovoLog SSI every 4 hours until patient on a solid diet. -Diabetes coordinator following.  3. hypothyroidism -Continue home dose Synthroid.  4.  Depression and anxiety -Continue BuSpar, Zoloft.  5.  Seizure disorder -Continue home regimen Keppra.  6.  Chronic pain -Continue home regimen of fentanyl patch, home regimen oxycodone 10 mg every 6 hours as needed, gabapentin. -Could likely resume home regimen Soma tomorrow.   DVT prophylaxis: Lovenox Code Status: Full Family Communication: Updated patient.  No family at bedside. Disposition: Home when clinically improved.  Status is: Inpatient The patient will require care spanning > 2 midnights and should be moved to inpatient because: Severity of illness   Consultants:  None  Procedures:  CT abdomen pelvis 07/02/2022   Antimicrobials:  None   Subjective: Patient laying in bed trying to eat [place] ice.  Denies any chest pain.  No shortness of breath.  Complaining of diffuse abdominal cramping.  Stated had watery loose stool this morning.  Not feeling too well today.  Prefers to stay on clear liquids for now.  Objective: Vitals:   07/02/22 2330 07/03/22 0039 07/03/22 0643 07/03/22 0946  BP: 120/76 105/68 123/80 105/68  Pulse: (!) 110 (!) 108 86 78  Resp: (!) 24 (!) 21  18 16  Temp:  99.9 F (37.7 C) 98.4 F (36.9 C) 98 F (36.7 C)  TempSrc:  Oral Oral Oral  SpO2: 96% 96% 97% 92%  Weight:      Height:        Intake/Output Summary (Last 24 hours) at 07/03/2022 1422 Last data filed at 07/03/2022 0039 Gross per 24 hour  Intake 1237  ml  Output --  Net 1237 ml   Filed Weights   07/02/22 1455  Weight: 116.6 kg    Examination:  General exam: Appears calm and comfortable  Respiratory system: Clear to auscultation. Respiratory effort normal. Cardiovascular system: S1 & S2 heard, RRR. No JVD, murmurs, rubs, gallops or clicks. No pedal edema. Gastrointestinal system: Abdomen is nondistended, soft and diffusely tender to palpation.  Positive bowel sounds.  No rebound.  No guarding.   Central nervous system: Alert and oriented. No focal neurological deficits. Extremities: Symmetric 5 x 5 power. Skin: No rashes, lesions or ulcers Psychiatry: Judgement and insight appear normal. Mood & affect appropriate.     Data Reviewed: I have personally reviewed following labs and imaging studies  CBC: Recent Labs  Lab 07/02/22 1504 07/02/22 1529 07/02/22 1647 07/03/22 0233  WBC 10.8*  --   --  6.5  NEUTROABS 9.2*  --   --   --   HGB 16.3* 16.7* 17.0* 16.0*  HCT 46.9* 49.0* 50.0* 46.6*  MCV 91.8  --   --  91.9  PLT 273  --   --  188    Basic Metabolic Panel: Recent Labs  Lab 07/02/22 1640 07/02/22 1647 07/02/22 2311 07/03/22 0233 07/03/22 1016  NA 131* 132* 132* 132* 129*  K 3.8 4.0 4.0 3.8 3.8  CL 98 99 101 104 101  CO2 17*  --  20* 15* 20*  GLUCOSE 461* 461* 311* 304* 350*  BUN 29* 30* 24* 24* 23*  CREATININE 1.09* 0.90 0.99 1.01* 1.03*  CALCIUM 9.1  --  8.7* 8.7* 8.5*    GFR: Estimated Creatinine Clearance: 80.6 mL/min (A) (by C-G formula based on SCr of 1.03 mg/dL (H)).  Liver Function Tests: Recent Labs  Lab 07/02/22 1640 07/03/22 0233  AST 51* 32  ALT 57* 45*  ALKPHOS 129* 104  BILITOT 1.2 0.7  PROT 7.1 6.6  ALBUMIN 3.7 3.4*    CBG: Recent Labs  Lab 07/02/22 1515 07/02/22 2112 07/03/22 1145  GLUCAP 475* 315* 324*     Recent Results (from the past 240 hour(s))  Resp panel by RT-PCR (RSV, Flu A&B, Covid) Anterior Nasal Swab     Status: None   Collection Time: 07/02/22  4:40 PM    Specimen: Anterior Nasal Swab  Result Value Ref Range Status   SARS Coronavirus 2 by RT PCR NEGATIVE NEGATIVE Final   Influenza A by PCR NEGATIVE NEGATIVE Final   Influenza B by PCR NEGATIVE NEGATIVE Final    Comment: (NOTE) The Xpert Xpress SARS-CoV-2/FLU/RSV plus assay is intended as an aid in the diagnosis of influenza from Nasopharyngeal swab specimens and should not be used as a sole basis for treatment. Nasal washings and aspirates are unacceptable for Xpert Xpress SARS-CoV-2/FLU/RSV testing.  Fact Sheet for Patients: BloggerCourse.com  Fact Sheet for Healthcare Providers: SeriousBroker.it  This test is not yet approved or cleared by the [place] FDA and has been authorized for detection and/or diagnosis of SARS-CoV-2 by FDA under an Emergency Use Authorization (EUA). This EUA will remain in effect (meaning this test can be used) for  the duration of the COVID-19 declaration under Section 564(b)(1) of the Act, 21 U.S.C. section 360bbb-3(b)(1), unless the authorization is terminated or revoked.     Resp Syncytial Virus by PCR NEGATIVE NEGATIVE Final    Comment: (NOTE) Fact Sheet for Patients: BloggerCourse.comhttps://www.fda.gov/media/152166/download  Fact Sheet for Healthcare Providers: SeriousBroker.ithttps://www.fda.gov/media/152162/download  This test is not yet approved or cleared by the [place] FDA and has been authorized for detection and/or diagnosis of SARS-CoV-2 by FDA under an Emergency Use Authorization (EUA). This EUA will remain in effect (meaning this test can be used) for the duration of the COVID-19 declaration under Section 564(b)(1) of the Act, 21 U.S.C. section 360bbb-3(b)(1), unless the authorization is terminated or revoked.  Performed at [facility], [address] [place], [place] [address]          Radiology Studies: CT ABDOMEN PELVIS W CONTRAST  Result Date: 07/02/2022 CLINICAL DATA:  Vomiting and  abdominal pain. EXAM: CT ABDOMEN AND PELVIS WITH CONTRAST TECHNIQUE: Multidetector CT imaging of the abdomen and pelvis was performed using the standard protocol following bolus administration of intravenous contrast. RADIATION DOSE REDUCTION: This exam was performed according to the departmental dose-optimization program which includes automated exposure control, adjustment of the mA and/or kV according to patient size and/or use of iterative reconstruction technique. CONTRAST:  75mL OMNIPAQUE IOHEXOL 350 MG/ML SOLN COMPARISON:  May 14, 2021 FINDINGS: Lower chest: Mild atelectasis is seen within the posterior aspect of the right lung base. Hepatobiliary: No focal liver abnormality is seen. Status post cholecystectomy. No biliary dilatation. Pancreas: Unremarkable. No pancreatic ductal dilatation or surrounding inflammatory changes. Spleen: Normal in size without focal abnormality. Adrenals/Urinary Tract: Adrenal glands are unremarkable. Kidneys are normal in size, without renal calculi or hydronephrosis. A 1.3 cm simple cyst is seen within the posterior aspect of the upper pole of the left kidney. Bladder is unremarkable. Stomach/Bowel: Stomach is within normal limits. Appendix appears normal. No evidence of bowel wall thickening, distention, or inflammatory changes. Vascular/Lymphatic: No significant vascular findings are present. Stable right upper quadrant and mid line upper abdomen lymphadenopathy is noted. Reproductive: A properly positioned IUD is seen within an otherwise normal appearing uterus. The bilateral adnexa are unremarkable. Other: No abdominal wall hernia or abnormality. No abdominopelvic ascites. Musculoskeletal: A stable 7.8 cm x 3.2 cm, likely benign mixed attenuation, partially calcified mass is seen within the subcutaneous soft tissues of the posterior pelvic wall, to the left of midline. No acute osseous abnormalities are identified. IMPRESSION: 1. No acute or active process within the  abdomen or pelvis. 2. Evidence of prior cholecystectomy. 3. Small simple cyst within the left kidney. No follow-up imaging is recommended. This recommendation follows ACR consensus guidelines: Management of the Incidental Renal Mass on CT: A White Paper of the ACR Incidental Findings Committee. J Am Coll Radiol [phone];15:264-273. Electronically Signed   By: Aram Candelahaddeus  Houston M.D.   On: 07/02/2022 21:15        Scheduled Meds:  busPIRone  10 mg Oral BID   enoxaparin (LOVENOX) injection  40 mg Subcutaneous Q24H   [START ON 07/05/2022] fentaNYL  1 patch Transdermal Q72H   gabapentin  600 mg Oral TID   insulin aspart  0-15 Units Subcutaneous Q4H   insulin glargine-yfgn  20 Units Subcutaneous QHS   levETIRAcetam  750 mg Oral TID   levothyroxine  150 mcg Oral Q0600   sertraline  50 mg Oral QHS   Continuous Infusions:  sodium chloride 125 mL/hr at 07/03/22 1153  LOS: 0 days    Time spent: 40 minutes    Ramiro Harvest, MD [facility]   To contact the attending provider between 7A-7P or the covering provider during after hours 7P-7A, please log into the web site [email or web address] and access using universal [place] password for that web site. If you do not have the password, please call the hospital operator.  07/03/2022, 2:22 PM

## 2022-07-03 NOTE — Care Management Obs Status (Signed)
MEDICARE OBSERVATION STATUS NOTIFICATION   Patient Details  Name: [name] MRN: [id number] Date of Birth: Jun 30, 1970   Medicare Observation Status Notification Given:  Yes    Harriet Masson, RN 07/03/2022, 12:11 PM

## 2022-07-03 NOTE — Inpatient Diabetes Management (Addendum)
Inpatient Diabetes Program Recommendations  AACE/ADA: New Consensus Statement on Inpatient Glycemic Control (2015)  Target Ranges:  Prepandial:   less than 140 mg/dL      Peak postprandial:   less than 180 mg/dL (1-2 hours)      Critically ill patients:  140 - 180 mg/dL   Lab Results  Component Value Date   GLUCAP 315 (H) 07/02/2022   HGBA1C 7.9 (H) 09/19/2021    Review of Glycemic Control  Latest Reference Range & Units 07/02/22 15:15 07/02/22 21:12  Glucose-Capillary 70 - 99 mg/dL 327 (H) 614 (H)  (H): Data is abnormally high  Diabetes history: DM 2 Outpatient Diabetes medications:Novolog 70/30 60 units TID, Ozempic 0.25 weekly Current orders for Inpatient glycemic control:  none   A1c 10.7% on 06/03/22 Pt sees Dr. Talmage Nap Endocrinology for DM control   Inpatient Diabetes Program Recommendations:    Consider adding Semglee 20 units BID and Novolog 0-15 units Q4H until diet advances.  Spoke with patient regarding outpatient diabetes management. Verified home doses above and reports recently starting Ozempic within the month. Since then, finding doses may need to be lowered.  Reviewed patient's current A1c of 10.3% on 06/26/22. Explained what a A1c is and what it measures. Also reviewed goal A1c with patient, importance of good glucose control @ home, and blood sugar goals. Reviewed patho of DM, role of pancreas, survival skills, interventions, vascular changes and long term commorbidities.  Patient has a meter and supplies needed at home. Reviewed when to reach out to PCP and the need for follow up appointment. No longer seeing Dr Talmage Nap.  Patient reports avoids sugary beverages. Reminded of importance of CHo mindfulness.  No further questions at this time.     Thanks, Lujean Rave, MSN, RNC-OB Diabetes Coordinator [phone] (8a-5p)

## 2022-07-03 NOTE — Discharge Instructions (Addendum)
You are scheduled for an assessment for the PHP (Partial Hospitalization Program) on 07/10/22 @ 1p. This is the group therapy and mental health follow up Dr. [name] discussed with you. This appointment will last approximately one hour and will be virtual via Webex. PHP is virtual group therapy that runs Mon-Fri from 9am-1pm. Please download the [employer] app prior to the appointment. If you need to cancel or reschedule, please call [phone].

## 2022-07-04 DIAGNOSIS — R569 Unspecified convulsions: Secondary | ICD-10-CM | POA: Diagnosis not present

## 2022-07-04 DIAGNOSIS — I951 Orthostatic hypotension: Secondary | ICD-10-CM

## 2022-07-04 DIAGNOSIS — E876 Hypokalemia: Secondary | ICD-10-CM | POA: Diagnosis present

## 2022-07-04 DIAGNOSIS — E1165 Type 2 diabetes mellitus with hyperglycemia: Secondary | ICD-10-CM | POA: Diagnosis not present

## 2022-07-04 DIAGNOSIS — G8929 Other chronic pain: Secondary | ICD-10-CM | POA: Diagnosis not present

## 2022-07-04 DIAGNOSIS — R112 Nausea with vomiting, unspecified: Secondary | ICD-10-CM | POA: Diagnosis not present

## 2022-07-04 LAB — CBC WITH DIFFERENTIAL/PLATELET
Abs Immature Granulocytes: 0.01 10*3/uL (ref 0.00–0.07)
Basophils Absolute: 0 10*3/uL (ref 0.0–0.1)
Basophils Relative: 0 %
Eosinophils Absolute: 0.1 10*3/uL (ref 0.0–0.5)
Eosinophils Relative: 1 %
HCT: 38.6 % (ref 36.0–46.0)
Hemoglobin: 13 g/dL (ref 12.0–15.0)
Immature Granulocytes: 0 %
Lymphocytes Relative: 50 %
Lymphs Abs: 2.9 10*3/uL (ref 0.7–4.0)
MCH: 31.3 pg (ref 26.0–34.0)
MCHC: 33.7 g/dL (ref 30.0–36.0)
MCV: 93 fL (ref 80.0–100.0)
Monocytes Absolute: 0.7 10*3/uL (ref 0.1–1.0)
Monocytes Relative: 13 %
Neutro Abs: 2.1 10*3/uL (ref 1.7–7.7)
Neutrophils Relative %: 36 %
Platelets: 186 10*3/uL (ref 150–400)
RBC: 4.15 MIL/uL (ref 3.87–5.11)
RDW: 13 % (ref 11.5–15.5)
WBC: 5.7 10*3/uL (ref 4.0–10.5)
nRBC: 0 % (ref 0.0–0.2)

## 2022-07-04 LAB — GLUCOSE, CAPILLARY
Glucose-Capillary: 145 mg/dL — ABNORMAL HIGH (ref 70–99)
Glucose-Capillary: 152 mg/dL — ABNORMAL HIGH (ref 70–99)
Glucose-Capillary: 154 mg/dL — ABNORMAL HIGH (ref 70–99)
Glucose-Capillary: 155 mg/dL — ABNORMAL HIGH (ref 70–99)
Glucose-Capillary: 218 mg/dL — ABNORMAL HIGH (ref 70–99)
Glucose-Capillary: 322 mg/dL — ABNORMAL HIGH (ref 70–99)

## 2022-07-04 LAB — BASIC METABOLIC PANEL
Anion gap: 7 (ref 5–15)
BUN: 16 mg/dL (ref 6–20)
CO2: 20 mmol/L — ABNORMAL LOW (ref 22–32)
Calcium: 8.3 mg/dL — ABNORMAL LOW (ref 8.9–10.3)
Chloride: 108 mmol/L (ref 98–111)
Creatinine, Ser: 0.76 mg/dL (ref 0.44–1.00)
GFR, Estimated: >60 mL/min (ref >60)
Glucose, Bld: 179 mg/dL — ABNORMAL HIGH (ref 70–99)
Potassium: 3.3 mmol/L — ABNORMAL LOW (ref 3.5–5.1)
Sodium: 135 mmol/L (ref 135–145)

## 2022-07-04 LAB — MAGNESIUM: Magnesium: 1.4 mg/dL — ABNORMAL LOW (ref 1.7–2.4)

## 2022-07-04 MED ORDER — MAGNESIUM SULFATE 50 % IJ SOLN
6.0000 g | Freq: Once | INTRAVENOUS | Status: AC
  Administered 2022-07-04: 6 g via INTRAVENOUS
  Filled 2022-07-04: qty 12

## 2022-07-04 MED ORDER — MIDODRINE HCL 5 MG PO TABS
5.0000 mg | ORAL_TABLET | Freq: Three times a day (TID) | ORAL | Status: DC
  Filled 2022-07-04: qty 1

## 2022-07-04 MED ORDER — FENTANYL 50 MCG/HR TD PT72
1.0000 | MEDICATED_PATCH | TRANSDERMAL | Status: DC
  Administered 2022-07-04 – 2022-07-07 (×2): 1 via TRANSDERMAL
  Filled 2022-07-04 (×2): qty 1

## 2022-07-04 MED ORDER — PANTOPRAZOLE SODIUM 40 MG PO TBEC
40.0000 mg | DELAYED_RELEASE_TABLET | Freq: Every day | ORAL | Status: DC
  Administered 2022-07-04 – 2022-07-08 (×5): 40 mg via ORAL
  Filled 2022-07-04 (×5): qty 1

## 2022-07-04 MED ORDER — INSULIN GLARGINE-YFGN 100 UNIT/ML ~~LOC~~ SOLN
20.0000 [IU] | Freq: Two times a day (BID) | SUBCUTANEOUS | Status: DC
  Administered 2022-07-04 – 2022-07-05 (×3): 20 [IU] via SUBCUTANEOUS
  Filled 2022-07-04 (×4): qty 0.2

## 2022-07-04 MED ORDER — POTASSIUM CHLORIDE CRYS ER 10 MEQ PO TBCR
40.0000 meq | EXTENDED_RELEASE_TABLET | Freq: Once | ORAL | Status: AC
  Administered 2022-07-04: 40 meq via ORAL
  Filled 2022-07-04: qty 4

## 2022-07-04 NOTE — Progress Notes (Addendum)
PROGRESS NOTE    [name]  ZOX:[id number] DOB: [birth year] DOA: 07/02/2022 PCP: Loyola Mastole, Laura, NP    Chief Complaint  Patient presents with   Emesis    Brief Narrative:  Patient is a 52 year old female history of type 2 diabetes, chronic pain, hypertension, hypothyroidism, migraines, OA, anxiety depression, GERD presented with persistent nausea vomiting and diarrhea after eating a [employer] pizza with multiple loose stools and inability to keep anything down.  Patient's boyfriend also ate the same pizza also had diarrhea but not as severe as the patient.  Patient seen in the ED noted to be tachycardic, intermittent tachypnea with some hypertension.  Noted to have a blood glucose of 475, COVID-19 PCR, RSV PCR negative.  Patient noted on initial presentation to have a bicarb of 19, anion gap of 16, urine pregnancy negative, CT abdomen and pelvis with no acute abnormalities.  Patient placed on IV antiemetics, IV fluids and admitted.   Assessment & Plan:   Principal Problem:   Intractable vomiting with nausea Active Problems:   Poorly controlled type 2 diabetes mellitus (HCC)   Chronic pain   Depression with anxiety   Seizure   Hyperglycemia   Nausea vomiting and diarrhea   Hypokalemia   #1. intractable nausea vomiting and diarrhea -Likely secondary to foodborne gastroenteritis versus early onset DKA. -Lipase levels not concerning for pancreatitis. -Patient still with multiple bouts of diarrhea, nausea and vomiting slowly improving. -C. difficile PCR negative.   -GI pathogen panel pending.   -Continue IV fluids, IV antiemetics, supportive care.   -Advance diet to a full liquid diet and follow.   2.  Type 2 diabetes with hyperglycemia -Patient with noted to admitting physician unable to eat anything, patient noted to be hyperglycemic initially with elevated anion gap and mildly elevated beta hydroxybutyrate felt likely secondary to starvation ketosis versus early onset  DKA. -Labs this morning with bicarb of 20, anion gap of 7.  CBG of 179. -Hemoglobin A1c 10.0. -Continue maintenance IV fluids.   -Patient on 70/30 40 units twice daily is what is prescribed however per med rec patient taking 60 units 3 times daily, as well as Ozempic weekly. -Continue Semglee 20 units twice daily, NovoLog SSI.  Continue CBGs every 4 hours.   -Diabetes coordinator following.    3. hypothyroidism -Synthroid.   4.  Depression and anxiety -Continue Zoloft, BuSpar.  5.  Seizure disorder -Stable.   -Continue home regimen Keppra.   6.  Chronic pain -Continue home regimen of fentanyl patch, home regimen oxycodone 10 mg every 6 hours as needed, gabapentin. -Home regimen Soma resumed today. -Could likely resume home regimen Soma tomorrow.  7.?  Orthostatic hypotension -Patient with complaints of blood pressure being low this morning, dizziness and lightheadedness from supine to standing position.  Patient states was on medication to help lower blood pressure in the past which was recently discontinued about a week ago by nurse practitioner at her PCPs office. -Patient may be describing a history of chronic orthostatic hypotension and likely may have been on midodrine. -Continue IV fluids, check orthostasis.  8.  Hypokalemia/hypomagnesemia -Likely secondary to GI losses. -Replete. -Repeat labs in the AM.   DVT prophylaxis: Lovenox Code Status: Full Family Communication: Updated patient.  No family at bedside. Disposition: Home when clinically improved.  Status is: Inpatient The patient will require care spanning > 2 midnights and should be moved to inpatient because: Severity of illness   Consultants:  None  Procedures:  CT abdomen pelvis 07/02/2022  Antimicrobials:  None   Subjective: Patient laying in bed.  Seems to be tolerating clear liquids.  Still with complaints of diffuse abdominal pain/discomfort/cramping.  Stated still having multiple watery  stools.  Complaining of some lightheadedness and dizziness from supine to standing position.  Patient states blood pressure has been low.  Patient states was on the medication to help keep her blood pressure up that was recently stopped by nurse practitioner about a week ago at her PCPs office.  Objective: Vitals:   07/03/22 2130 07/04/22 0436 07/04/22 0831 07/04/22 0956  BP: 123/77 (!) 102/58 (!) 92/57 113/66  Pulse: 88 72 69 68  Resp: 18 17 18    Temp: 98.7 F (37.1 C) 98.4 F (36.9 C) 98 F (36.7 C)   TempSrc: Oral Oral Oral   SpO2: 96% 95% 96%   Weight:      Height:        Intake/Output Summary (Last 24 hours) at 07/04/2022 1559 Last data filed at 07/04/2022 1000 Gross per 24 hour  Intake 2025.96 ml  Output 1 ml  Net 2024.96 ml   Filed Weights   07/02/22 1455  Weight: 116.6 kg    Examination:  General exam: NAD. Respiratory system: CTAB.  No wheezes, no crackles, no rhonchi.  Fair air movement.  Speaking in full sentences.  Cardiovascular system: Regular rate rhythm no murmurs rubs or gallops.  No JVD.  No lower extremity edema.  Gastrointestinal system: Abdomen is soft, nondistended, diffusely tender to palpation.  Positive bowel sounds.  No rebound.  No guarding.   Central nervous system: Alert and oriented. No focal neurological deficits. Extremities: Symmetric 5 x 5 power. Skin: No rashes, lesions or ulcers Psychiatry: Judgement and insight appear normal. Mood & affect appropriate.     Data Reviewed: I have personally reviewed following labs and imaging studies  CBC: Recent Labs  Lab 07/02/22 1504 07/02/22 1529 07/02/22 1647 07/03/22 0233 07/04/22 0038  WBC 10.8*  --   --  6.5 5.7  NEUTROABS 9.2*  --   --   --  2.1  HGB 16.3* 16.7* 17.0* 16.0* 13.0  HCT 46.9* 49.0* 50.0* 46.6* 38.6  MCV 91.8  --   --  91.9 93.0  PLT 273  --   --  188 186    Basic Metabolic Panel: Recent Labs  Lab 07/02/22 1640 07/02/22 1647 07/02/22 2311 07/03/22 0233  07/03/22 1016 07/04/22 0038  NA 131* 132* 132* 132* 129* 135  K 3.8 4.0 4.0 3.8 3.8 3.3*  CL 98 99 101 104 101 108  CO2 17*  --  20* 15* 20* 20*  GLUCOSE 461* 461* 311* 304* 350* 179*  BUN 29* 30* 24* 24* 23* 16  CREATININE 1.09* 0.90 0.99 1.01* 1.03* 0.76  CALCIUM 9.1  --  8.7* 8.7* 8.5* 8.3*  MG  --   --   --   --   --  1.4*    GFR: Estimated Creatinine Clearance: 103.7 mL/min (by C-G formula based on SCr of 0.76 mg/dL).  Liver Function Tests: Recent Labs  Lab 07/02/22 1640 07/03/22 0233  AST 51* 32  ALT 57* 45*  ALKPHOS 129* 104  BILITOT 1.2 0.7  PROT 7.1 6.6  ALBUMIN 3.7 3.4*    CBG: Recent Labs  Lab 07/03/22 2134 07/04/22 0049 07/04/22 0443 07/04/22 0826 07/04/22 1232  GLUCAP 219* 152* 155* 145* 154*     Recent Results (from the past 240 hour(s))  Resp panel by RT-PCR (RSV, Flu A&B, Covid) Anterior  Nasal Swab     Status: None   Collection Time: 07/02/22  4:40 PM   Specimen: Anterior Nasal Swab  Result Value Ref Range Status   SARS Coronavirus 2 by RT PCR NEGATIVE NEGATIVE Final   Influenza A by PCR NEGATIVE NEGATIVE Final   Influenza B by PCR NEGATIVE NEGATIVE Final    Comment: (NOTE) The Xpert Xpress SARS-CoV-2/FLU/RSV plus assay is intended as an aid in the diagnosis of influenza from Nasopharyngeal swab specimens and should not be used as a sole basis for treatment. Nasal washings and aspirates are unacceptable for Xpert Xpress SARS-CoV-2/FLU/RSV testing.  Fact Sheet for Patients: BloggerCourse.com  Fact Sheet for Healthcare Providers: SeriousBroker.it  This test is not yet approved or cleared by the [place] FDA and has been authorized for detection and/or diagnosis of SARS-CoV-2 by FDA under an Emergency Use Authorization (EUA). This EUA will remain in effect (meaning this test can be used) for the duration of the COVID-19 declaration under Section 564(b)(1) of the Act, 21  U.S.C. section 360bbb-3(b)(1), unless the authorization is terminated or revoked.     Resp Syncytial Virus by PCR NEGATIVE NEGATIVE Final    Comment: (NOTE) Fact Sheet for Patients: BloggerCourse.com  Fact Sheet for Healthcare Providers: SeriousBroker.it  This test is not yet approved or cleared by the [place] FDA and has been authorized for detection and/or diagnosis of SARS-CoV-2 by FDA under an Emergency Use Authorization (EUA). This EUA will remain in effect (meaning this test can be used) for the duration of the COVID-19 declaration under Section 564(b)(1) of the Act, 21 U.S.C. section 360bbb-3(b)(1), unless the authorization is terminated or revoked.  Performed at [facility], [address] [place], [place] [address]   C Difficile Quick Screen w PCR reflex     Status: None   Collection Time: 07/03/22  4:40 PM   Specimen: STOOL  Result Value Ref Range Status   C Diff antigen NEGATIVE NEGATIVE Final   C Diff toxin NEGATIVE NEGATIVE Final   C Diff interpretation No C. difficile detected.  Final    Comment: Performed at [facility], [address] [place], [place] [address]         Radiology Studies: CT ABDOMEN PELVIS W CONTRAST  Result Date: 07/02/2022 CLINICAL DATA:  Vomiting and abdominal pain. EXAM: CT ABDOMEN AND PELVIS WITH CONTRAST TECHNIQUE: Multidetector CT imaging of the abdomen and pelvis was performed using the standard protocol following bolus administration of intravenous contrast. RADIATION DOSE REDUCTION: This exam was performed according to the departmental dose-optimization program which includes automated exposure control, adjustment of the mA and/or kV according to patient size and/or use of iterative reconstruction technique. CONTRAST:  7mL OMNIPAQUE IOHEXOL 350 MG/ML SOLN COMPARISON:  May 14, 2021 FINDINGS: Lower chest: Mild atelectasis is seen within the posterior aspect of  the right lung base. Hepatobiliary: No focal liver abnormality is seen. Status post cholecystectomy. No biliary dilatation. Pancreas: Unremarkable. No pancreatic ductal dilatation or surrounding inflammatory changes. Spleen: Normal in size without focal abnormality. Adrenals/Urinary Tract: Adrenal glands are unremarkable. Kidneys are normal in size, without renal calculi or hydronephrosis. A 1.3 cm simple cyst is seen within the posterior aspect of the upper pole of the left kidney. Bladder is unremarkable. Stomach/Bowel: Stomach is within normal limits. Appendix appears normal. No evidence of bowel wall thickening, distention, or inflammatory changes. Vascular/Lymphatic: No significant vascular findings are present. Stable right upper quadrant and mid line upper abdomen lymphadenopathy is noted. Reproductive: A  properly positioned IUD is seen within an otherwise normal appearing uterus. The bilateral adnexa are unremarkable. Other: No abdominal wall hernia or abnormality. No abdominopelvic ascites. Musculoskeletal: A stable 7.8 cm x 3.2 cm, likely benign mixed attenuation, partially calcified mass is seen within the subcutaneous soft tissues of the posterior pelvic wall, to the left of midline. No acute osseous abnormalities are identified. IMPRESSION: 1. No acute or active process within the abdomen or pelvis. 2. Evidence of prior cholecystectomy. 3. Small simple cyst within the left kidney. No follow-up imaging is recommended. This recommendation follows ACR consensus guidelines: Management of the Incidental Renal Mass on CT: A White Paper of the ACR Incidental Findings Committee. J Am Coll Radiol [phone]. Electronically Signed   By: Aram Candela M.D.   On: 07/02/2022 21:15        Scheduled Meds:  busPIRone  10 mg Oral BID   carisoprodol  350 mg Oral QID   enoxaparin (LOVENOX) injection  40 mg Subcutaneous Q24H   fentaNYL  1 patch Transdermal Q72H   gabapentin  600 mg Oral TID   insulin  aspart  0-15 Units Subcutaneous Q4H   insulin glargine-yfgn  20 Units Subcutaneous BID   levETIRAcetam  750 mg Oral TID   levothyroxine  150 mcg Oral Q0600   loratadine  10 mg Oral Daily   pantoprazole  40 mg Oral Daily   sertraline  50 mg Oral QHS   Continuous Infusions:  sodium chloride 125 mL/hr at 07/04/22 0825     LOS: 1 day    Time spent: 40 minutes    Ramiro Harvest, MD [facility]   To contact the attending provider between 7A-7P or the covering provider during after hours 7P-7A, please log into the web site [email or web address] and access using universal [place] password for that web site. If you do not have the password, please call the hospital operator.  07/04/2022, 3:59 PM

## 2022-07-05 DIAGNOSIS — E1165 Type 2 diabetes mellitus with hyperglycemia: Secondary | ICD-10-CM | POA: Diagnosis not present

## 2022-07-05 DIAGNOSIS — F418 Other specified anxiety disorders: Secondary | ICD-10-CM | POA: Diagnosis not present

## 2022-07-05 DIAGNOSIS — R112 Nausea with vomiting, unspecified: Secondary | ICD-10-CM | POA: Diagnosis not present

## 2022-07-05 DIAGNOSIS — G8929 Other chronic pain: Secondary | ICD-10-CM | POA: Diagnosis not present

## 2022-07-05 LAB — BASIC METABOLIC PANEL
Anion gap: 6 (ref 5–15)
BUN: 7 mg/dL (ref 6–20)
CO2: 20 mmol/L — ABNORMAL LOW (ref 22–32)
Calcium: 8.3 mg/dL — ABNORMAL LOW (ref 8.9–10.3)
Chloride: 108 mmol/L (ref 98–111)
Creatinine, Ser: 0.74 mg/dL (ref 0.44–1.00)
GFR, Estimated: >60 mL/min (ref >60)
Glucose, Bld: 151 mg/dL — ABNORMAL HIGH (ref 70–99)
Potassium: 3.6 mmol/L (ref 3.5–5.1)
Sodium: 134 mmol/L — ABNORMAL LOW (ref 135–145)

## 2022-07-05 LAB — GLUCOSE, CAPILLARY
Glucose-Capillary: 117 mg/dL — ABNORMAL HIGH (ref 70–99)
Glucose-Capillary: 159 mg/dL — ABNORMAL HIGH (ref 70–99)
Glucose-Capillary: 171 mg/dL — ABNORMAL HIGH (ref 70–99)
Glucose-Capillary: 208 mg/dL — ABNORMAL HIGH (ref 70–99)
Glucose-Capillary: 229 mg/dL — ABNORMAL HIGH (ref 70–99)
Glucose-Capillary: 255 mg/dL — ABNORMAL HIGH (ref 70–99)

## 2022-07-05 LAB — CBC WITH DIFFERENTIAL/PLATELET
Abs Immature Granulocytes: 0.03 10*3/uL (ref 0.00–0.07)
Basophils Absolute: 0 10*3/uL (ref 0.0–0.1)
Basophils Relative: 0 %
Eosinophils Absolute: 0 10*3/uL (ref 0.0–0.5)
Eosinophils Relative: 0 %
HCT: 38.7 % (ref 36.0–46.0)
Hemoglobin: 13.3 g/dL (ref 12.0–15.0)
Immature Granulocytes: 1 %
Lymphocytes Relative: 53 %
Lymphs Abs: 3.3 10*3/uL (ref 0.7–4.0)
MCH: 32 pg (ref 26.0–34.0)
MCHC: 34.4 g/dL (ref 30.0–36.0)
MCV: 93.3 fL (ref 80.0–100.0)
Monocytes Absolute: 0.5 10*3/uL (ref 0.1–1.0)
Monocytes Relative: 8 %
Neutro Abs: 2.4 10*3/uL (ref 1.7–7.7)
Neutrophils Relative %: 38 %
Platelets: 188 10*3/uL (ref 150–400)
RBC: 4.15 MIL/uL (ref 3.87–5.11)
RDW: 12.9 % (ref 11.5–15.5)
WBC: 6.2 10*3/uL (ref 4.0–10.5)
nRBC: 0 % (ref 0.0–0.2)

## 2022-07-05 LAB — MAGNESIUM: Magnesium: 2 mg/dL (ref 1.7–2.4)

## 2022-07-05 MED ORDER — LOPERAMIDE HCL 2 MG PO CAPS
2.0000 mg | ORAL_CAPSULE | ORAL | Status: DC | PRN
  Filled 2022-07-05 (×2): qty 1

## 2022-07-05 MED ORDER — INSULIN GLARGINE-YFGN 100 UNIT/ML ~~LOC~~ SOLN
25.0000 [IU] | Freq: Two times a day (BID) | SUBCUTANEOUS | Status: DC
  Administered 2022-07-05 – 2022-07-06 (×3): 25 [IU] via SUBCUTANEOUS
  Filled 2022-07-05 (×5): qty 0.25

## 2022-07-05 MED ORDER — INSULIN ASPART 100 UNIT/ML IJ SOLN: 0.0000 [IU] | Freq: Four times a day (QID) | INTRAMUSCULAR | Status: DC

## 2022-07-05 MED ORDER — SODIUM CHLORIDE 0.9 % IV SOLN: INTRAVENOUS | Status: DC

## 2022-07-05 MED ORDER — INSULIN ASPART 100 UNIT/ML IJ SOLN
0.0000 [IU] | Freq: Three times a day (TID) | INTRAMUSCULAR | Status: DC
  Administered 2022-07-05: 5 [IU] via SUBCUTANEOUS
  Administered 2022-07-05: 8 [IU] via SUBCUTANEOUS
  Administered 2022-07-06 (×2): 3 [IU] via SUBCUTANEOUS
  Administered 2022-07-06: 5 [IU] via SUBCUTANEOUS

## 2022-07-05 MED ORDER — DICYCLOMINE HCL 10 MG PO CAPS
10.0000 mg | ORAL_CAPSULE | Freq: Once | ORAL | Status: AC
  Administered 2022-07-05: 10 mg via ORAL
  Filled 2022-07-05: qty 1

## 2022-07-05 NOTE — Progress Notes (Signed)
PROGRESS NOTE    [name]  ZOX:[id number] DOB: 10-27-1970 DOA: 07/02/2022 PCP: Loyola Mast, NP    Chief Complaint  Patient presents with   Emesis    Brief Narrative:  Patient is a 52 year old female history of type 2 diabetes, chronic pain, hypertension, hypothyroidism, migraines, OA, anxiety depression, GERD presented with persistent nausea vomiting and diarrhea after eating a [employer] pizza with multiple loose stools and inability to keep anything down.  Patient's boyfriend also ate the same pizza also had diarrhea but not as severe as the patient.  Patient seen in the ED noted to be tachycardic, intermittent tachypnea with some hypertension.  Noted to have a blood glucose of 475, COVID-19 PCR, RSV PCR negative.  Patient noted on initial presentation to have a bicarb of 19, anion gap of 16, urine pregnancy negative, CT abdomen and pelvis with no acute abnormalities.  Patient placed on IV antiemetics, IV fluids and admitted.   Assessment & Plan:   Principal Problem:   Intractable vomiting with nausea Active Problems:   Poorly controlled type 2 diabetes mellitus (HCC)   Chronic pain   Depression with anxiety   Seizure   Hyperglycemia   Nausea vomiting and diarrhea   Hypokalemia   #1. intractable nausea vomiting and diarrhea -Likely secondary to foodborne gastroenteritis versus early onset DKA. -Lipase levels not concerning for pancreatitis. -Patient still with multiple bouts of diarrhea, nausea and vomiting slowly improving. -C. difficile PCR negative.   -GI pathogen panel pending.   -Continue IV fluids, IV antiemetics, supportive care.   -Advance to carb modified diet.   2.  Type 2 diabetes with hyperglycemia -Patient with noted to admitting physician unable to eat anything, patient noted to be hyperglycemic initially with elevated anion gap and mildly elevated beta hydroxybutyrate felt likely secondary to starvation ketosis versus early onset DKA. -Labs this morning  with bicarb of 20, anion gap of 6.  CBG of 159. -Hemoglobin A1c 10.0. -Continue maintenance IV fluids.   -Patient on 70/30 40 units twice daily is what is prescribed however per med rec patient taking 60 units 3 times daily, as well as Ozempic weekly. -Increase Semglee to 25 units twice daily.  Continue NovoLog SSI.   -Change CBGs to before meals and at bedtime.  -Diabetes coordinator following.    3. hypothyroidism -Synthroid.    4.  Depression and anxiety -Continue BuSpar, Zoloft.   5.  Seizure disorder -Stable.   -Keppra.  6.  Chronic pain -Continue home regimen of fentanyl patch, home regimen oxycodone 10 mg every 6 hours as needed, gabapentin. -Continue home regimen Soma.   7.?  Orthostatic hypotension -Patient with complaints of blood pressure being low this morning, dizziness and lightheadedness from supine to standing position.  Patient states was on medication to help lower blood pressure in the past which was recently discontinued about a week ago by nurse practitioner at her PCPs office. -Patient may be describing a history of chronic orthostatic hypotension and likely may have been on midodrine. -Orthostatics checked was negative.   -Continue IV fluids for now.   -PT vestibular evaluation as patient with complaints of spinning sensation, dizziness and lightheadedness.  8.  Hypokalemia/hypomagnesemia -Likely secondary to GI losses. -Potassium at 3.6 this morning, magnesium at 2.0.   -Repeat labs in the AM.   DVT prophylaxis: Lovenox Code Status: Full Family Communication: Updated patient.  No family at bedside. Disposition: Home when clinically improved.  Status is: Inpatient The patient will require care spanning > 2  midnights and should be moved to inpatient because: Severity of illness   Consultants:  None  Procedures:  CT abdomen pelvis 07/02/2022   Antimicrobials:  None   Subjective: Patient laying in bed.  Somewhat tolerating full liquids however  she states was ever she takes and comes out as watery diarrhea.  States has had 2 watery bowel movements this morning with some motion as per patient.  Per RN stool was mixed with urine.  No chest pain.  No shortness of breath.  Still with complaints of abdominal diffuse cramping.  Complaining of a spinning/dizziness when she stands up.    Objective: Vitals:   07/04/22 1639 07/04/22 1934 07/05/22 0344 07/05/22 0830  BP: 125/73 124/64 135/74 114/67  Pulse: 78 72 77 81  Resp: Temp: 97.8 F (36.6 C) 98.8 F (37.1 C) 97.8 F (36.6 C) 97.6 F (36.4 C)  TempSrc: Axillary Oral Oral Oral  SpO2: 97% 99% 97% 96%  Weight:      Height:        Intake/Output Summary (Last 24 hours) at 07/05/2022 1253 Last data filed at 07/05/2022 0451 Gross per 24 hour  Intake 1587.94 ml  Output --  Net 1587.94 ml    Filed Weights   07/02/22 1455  Weight: 116.6 kg    Examination:  General exam: NAD. Respiratory system: Lungs clear to auscultation bilaterally.  No wheezes, no crackles, no rhonchi.  Fair air movement.  Speaking in full sentences.   Cardiovascular system: RRR no murmurs rubs or gallops.  No JVD.  No lower extremity edema.  Gastrointestinal system: Abdomen is soft, nondistended, nontender to palpation diffusely when patient is distracted.  Positive bowel sounds.  No rebound.  No guarding.  Central nervous system: Alert and oriented. No focal neurological deficits. Extremities: Symmetric 5 x 5 power. Skin: No rashes, lesions or ulcers Psychiatry: Judgement and insight appear normal. Mood & affect appropriate.     Data Reviewed: I have personally reviewed following labs and imaging studies  CBC: Recent Labs  Lab 07/02/22 1504 07/02/22 1529 07/02/22 1647 07/03/22 0233 07/04/22 0038 07/05/22 0443  WBC 10.8*  --   --  6.5 5.7 6.2  NEUTROABS 9.2*  --   --   --  2.1 2.4  HGB 16.3* 16.7* 17.0* 16.0* 13.0 13.3  HCT 46.9* 49.0* 50.0* 46.6* 38.6 38.7  MCV 91.8  --   --  91.9  93.0 93.3  PLT 273  --   --  188 186 188     Basic Metabolic Panel: Recent Labs  Lab 07/02/22 2311 07/03/22 0233 07/03/22 1016 07/04/22 0038 07/05/22 0443  NA 132* 132* 129* 135 134*  K 4.0 3.8 3.8 3.3* 3.6  CL 101 104 101 108 108  CO2 20* 15* 20* 20* 20*  GLUCOSE 311* 304* 350* 179* 151*  BUN 24* 24* 23* 16 7  CREATININE 0.99 1.01* 1.03* 0.76 0.74  CALCIUM 8.7* 8.7* 8.5* 8.3* 8.3*  MG  --   --   --  1.4* 2.0     GFR: Estimated Creatinine Clearance: 103.7 mL/min (by C-G formula based on SCr of 0.74 mg/dL).  Liver Function Tests: Recent Labs  Lab 07/02/22 1640 07/03/22 0233  AST 51* 32  ALT 57* 45*  ALKPHOS 129* 104  BILITOT 1.2 0.7  PROT 7.1 6.6  ALBUMIN 3.7 3.4*     CBG: Recent Labs  Lab 07/04/22 1640 07/04/22 1935 07/05/22 0013 07/05/22 0345 07/05/22 0821  GLUCAP 322* 218* 208*  117* 159*      Recent Results (from the past 240 hour(s))  Resp panel by RT-PCR (RSV, Flu A&B, Covid) Anterior Nasal Swab     Status: None   Collection Time: 07/02/22  4:40 PM   Specimen: Anterior Nasal Swab  Result Value Ref Range Status   SARS Coronavirus 2 by RT PCR NEGATIVE NEGATIVE Final   Influenza A by PCR NEGATIVE NEGATIVE Final   Influenza B by PCR NEGATIVE NEGATIVE Final    Comment: (NOTE) The Xpert Xpress SARS-CoV-2/FLU/RSV plus assay is intended as an aid in the diagnosis of influenza from Nasopharyngeal swab specimens and should not be used as a sole basis for treatment. Nasal washings and aspirates are unacceptable for Xpert Xpress SARS-CoV-2/FLU/RSV testing.  Fact Sheet for Patients: BloggerCourse.com  Fact Sheet for Healthcare Providers: SeriousBroker.it  This test is not yet approved or cleared by the [place] FDA and has been authorized for detection and/or diagnosis of SARS-CoV-2 by FDA under an Emergency Use Authorization (EUA). This EUA will remain in effect (meaning this test can be used)  for the duration of the COVID-19 declaration under Section 564(b)(1) of the Act, 21 U.S.C. section 360bbb-3(b)(1), unless the authorization is terminated or revoked.     Resp Syncytial Virus by PCR NEGATIVE NEGATIVE Final    Comment: (NOTE) Fact Sheet for Patients: BloggerCourse.com  Fact Sheet for Healthcare Providers: SeriousBroker.it  This test is not yet approved or cleared by the [place] FDA and has been authorized for detection and/or diagnosis of SARS-CoV-2 by FDA under an Emergency Use Authorization (EUA). This EUA will remain in effect (meaning this test can be used) for the duration of the COVID-19 declaration under Section 564(b)(1) of the Act, 21 U.S.C. section 360bbb-3(b)(1), unless the authorization is terminated or revoked.  Performed at [facility], [address], [place], [place] [address]   C Difficile Quick Screen w PCR reflex     Status: None   Collection Time: 07/03/22  4:40 PM   Specimen: STOOL  Result Value Ref Range Status   C Diff antigen NEGATIVE NEGATIVE Final   C Diff toxin NEGATIVE NEGATIVE Final   C Diff interpretation No C. difficile detected.  Final    Comment: Performed at [facility], [address] [place], [place] [address]         Radiology Studies: No results found.      Scheduled Meds:  busPIRone  10 mg Oral BID   carisoprodol  350 mg Oral QID   dicyclomine  10 mg Oral Once   enoxaparin (LOVENOX) injection  40 mg Subcutaneous Q24H   fentaNYL  1 patch Transdermal Q72H   gabapentin  600 mg Oral TID   insulin aspart  0-15 Units Subcutaneous TID WC   insulin glargine-yfgn  25 Units Subcutaneous BID   levETIRAcetam  750 mg Oral TID   levothyroxine  150 mcg Oral Q0600   loratadine  10 mg Oral Daily   pantoprazole  40 mg Oral Daily   sertraline  50 mg Oral QHS   Continuous Infusions:  sodium chloride       LOS: 2 days    Time spent: 40  minutes    Ramiro Harvest, MD [facility]   To contact the attending provider between 7A-7P or the covering provider during after hours 7P-7A, please log into the web site [email or web address] and access using universal [place] password for that web site. If you do not have the password,  please call the hospital operator.  07/05/2022, 12:53 PM

## 2022-07-05 NOTE — Inpatient Diabetes Management (Signed)
Inpatient Diabetes Program Recommendations  AACE/ADA: New Consensus Statement on Inpatient Glycemic Control (2015)  Target Ranges:  Prepandial:   less than 140 mg/dL      Peak postprandial:   less than 180 mg/dL (1-2 hours)      Critically ill patients:  140 - 180 mg/dL   Lab Results  Component Value Date   GLUCAP 159 (H) 07/05/2022   HGBA1C 10.0 (H) 07/03/2022    Review of Glycemic Control  Latest Reference Range & Units 07/04/22 16:40 07/04/22 19:35 07/05/22 00:13 07/05/22 03:45 07/05/22 08:21  Glucose-Capillary 70 - 99 mg/dL 500 (H) 938 (H) 182 (H) 117 (H) 159 (H)  (H): Data is abnormally high Diabetes history: DM 2 Outpatient Diabetes medications:Novolog 70/30 60 units TID, Ozempic 0.25 weekly Current orders for Inpatient glycemic control:  none   A1c 10.7% on 06/03/22 Pt sees Dr. Talmage [facility] for DM control   Inpatient Diabetes Program Recommendations:     Consider increasing Semglee 25 units BID.   If diet to further advance: -Change correction to Novolog 0-15 units TID & HS -Add Novolog 4 units TID (Assuming patient consuming >50% of meals)  Thanks, Lujean Rave, MSN, RNC-OB Diabetes Coordinator [phone] (8a-5p)

## 2022-07-06 DIAGNOSIS — E1165 Type 2 diabetes mellitus with hyperglycemia: Secondary | ICD-10-CM | POA: Diagnosis not present

## 2022-07-06 DIAGNOSIS — H8110 Benign paroxysmal vertigo, unspecified ear: Secondary | ICD-10-CM | POA: Diagnosis not present

## 2022-07-06 DIAGNOSIS — E876 Hypokalemia: Secondary | ICD-10-CM | POA: Diagnosis not present

## 2022-07-06 DIAGNOSIS — R112 Nausea with vomiting, unspecified: Secondary | ICD-10-CM | POA: Diagnosis not present

## 2022-07-06 LAB — CBC
HCT: 39 % (ref 36.0–46.0)
Hemoglobin: 13.2 g/dL (ref 12.0–15.0)
MCH: 31.3 pg (ref 26.0–34.0)
MCHC: 33.8 g/dL (ref 30.0–36.0)
MCV: 92.4 fL (ref 80.0–100.0)
Platelets: 192 10*3/uL (ref 150–400)
RBC: 4.22 MIL/uL (ref 3.87–5.11)
RDW: 12.7 % (ref 11.5–15.5)
WBC: 6.5 10*3/uL (ref 4.0–10.5)
nRBC: 0 % (ref 0.0–0.2)

## 2022-07-06 LAB — BASIC METABOLIC PANEL
Anion gap: 12 (ref 5–15)
BUN: 9 mg/dL (ref 6–20)
CO2: 21 mmol/L — ABNORMAL LOW (ref 22–32)
Calcium: 8.7 mg/dL — ABNORMAL LOW (ref 8.9–10.3)
Chloride: 105 mmol/L (ref 98–111)
Creatinine, Ser: 0.76 mg/dL (ref 0.44–1.00)
GFR, Estimated: >60 mL/min (ref >60)
Glucose, Bld: 195 mg/dL — ABNORMAL HIGH (ref 70–99)
Potassium: 3.4 mmol/L — ABNORMAL LOW (ref 3.5–5.1)
Sodium: 138 mmol/L (ref 135–145)

## 2022-07-06 LAB — GLUCOSE, CAPILLARY
Glucose-Capillary: 220 mg/dL — ABNORMAL HIGH (ref 70–99)
Glucose-Capillary: 193 mg/dL — ABNORMAL HIGH (ref 70–99)
Glucose-Capillary: 222 mg/dL — ABNORMAL HIGH (ref 70–99)

## 2022-07-06 MED ORDER — INSULIN ASPART 100 UNIT/ML IJ SOLN
0.0000 [IU] | Freq: Three times a day (TID) | INTRAMUSCULAR | Status: DC
  Administered 2022-07-07: 5 [IU] via SUBCUTANEOUS
  Administered 2022-07-07 – 2022-07-08 (×3): 3 [IU] via SUBCUTANEOUS
  Administered 2022-07-08: 5 [IU] via SUBCUTANEOUS
  Administered 2022-07-09 (×2): 8 [IU] via SUBCUTANEOUS
  Administered 2022-07-09: 5 [IU] via SUBCUTANEOUS

## 2022-07-06 MED ORDER — MECLIZINE HCL 25 MG PO TABS: 25.0000 mg | ORAL_TABLET | Freq: Three times a day (TID) | ORAL | Status: AC | PRN

## 2022-07-06 MED ORDER — LIDOCAINE 5 % EX PTCH
1.0000 | MEDICATED_PATCH | Freq: Every day | CUTANEOUS | Status: DC
  Administered 2022-07-06: 1 via TRANSDERMAL
  Filled 2022-07-06: qty 1

## 2022-07-06 MED ORDER — POTASSIUM CHLORIDE CRYS ER 20 MEQ PO TBCR
40.0000 meq | EXTENDED_RELEASE_TABLET | Freq: Once | ORAL | Status: AC
  Administered 2022-07-06: 40 meq via ORAL
  Filled 2022-07-06: qty 2

## 2022-07-06 MED ORDER — LIDOCAINE 5 % EX PTCH
2.0000 | MEDICATED_PATCH | Freq: Every day | CUTANEOUS | Status: DC
  Administered 2022-07-06 – 2022-07-08 (×3): 2 via TRANSDERMAL
  Filled 2022-07-06 (×3): qty 2

## 2022-07-06 MED ORDER — INSULIN ASPART 100 UNIT/ML IJ SOLN
0.0000 [IU] | Freq: Every day | INTRAMUSCULAR | Status: DC
  Administered 2022-07-06 – 2022-07-07 (×2): 2 [IU] via SUBCUTANEOUS
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
## 2022-07-06 NOTE — Progress Notes (Signed)
PROGRESS NOTE    [name]  WUJ:[id number] DOB: [birth year] DOA: 07/02/2022 PCP: Heather Mast, NP    Chief Complaint  Patient presents with   Emesis    Brief Narrative:  Patient is a 52 year old female history of type 2 diabetes, chronic pain, hypertension, hypothyroidism, migraines, OA, anxiety depression, GERD presented with persistent nausea vomiting and diarrhea after eating a [employer] pizza with multiple loose stools and inability to keep anything down.  Patient's boyfriend also ate the same pizza also had diarrhea but not as severe as the patient.  Patient seen in the ED noted to be tachycardic, intermittent tachypnea with some hypertension.  Noted to have a blood glucose of 475, COVID-19 PCR, RSV PCR negative.  Patient noted on initial presentation to have a bicarb of 19, anion gap of 16, urine pregnancy negative, CT abdomen and pelvis with no acute abnormalities.  Patient placed on IV antiemetics, IV fluids and admitted.   Assessment & Plan:   Principal Problem:   Intractable vomiting with nausea Active Problems:   Poorly controlled type 2 diabetes mellitus (HCC)   Chronic pain   Depression with anxiety   Seizure   Hyperglycemia   Nausea vomiting and diarrhea   Hypokalemia   BPPV (benign paroxysmal positional vertigo)   #1. intractable nausea vomiting and diarrhea -Likely secondary to foodborne gastroenteritis versus early onset DKA. -Lipase levels not concerning for pancreatitis. -Patient with improvement with diarrhea in terms of frequency and consistency.  Nausea and vomiting seem to have resolved.  Patient tolerating diet.  -C. difficile PCR negative.   -GI pathogen panel pending.   -Saline lock IV fluids.  IV antiemetics, supportive care.   -Continue carb modified diet.   2.  Type 2 diabetes with hyperglycemia -Patient with noted to admitting physician unable to eat anything, patient noted to be hyperglycemic initially with elevated anion gap and mildly  elevated beta hydroxybutyrate felt likely secondary to starvation ketosis versus early onset DKA. -Labs this morning with bicarb of 21, anion gap of 12.  Glucose of 195. -Hemoglobin A1c 10.0. -Continue maintenance IV fluids.   -Patient on 70/30 40 units twice daily is what is prescribed however per med rec patient taking 60 units 3 times daily, as well as Ozempic weekly. -Increase Semglee to 30 units twice daily.  Continue NovoLog SSI.   -Diabetes coordinator following.    3. hypothyroidism -Synthroid.    4.  Depression and anxiety -Continue BuSpar, Zoloft.    5.  Seizure disorder -Continue Keppra.   6.  Chronic pain -Continue home regimen of fentanyl patch, home regimen oxycodone 10 mg every 6 hours as needed, gabapentin, Soma.  7.?  Orthostatic hypotension/probable BPPV -Patient with dizziness and lightheadedness from supine to standing position.   Patient states was on medication to help lower blood pressure in the past which was recently discontinued about a week ago by nurse practitioner at her PCPs office. -Patient may be describing a history of chronic orthostatic hypotension and likely may have been on midodrine. -Orthostatics checked was negative.  -Patient hydrated with IV fluids however still with dizziness/spinning sensation. -PT vestibular evaluation ordered, assessed by PT and concerns for probable BPPV. -Patient to be reassessed by vestibular PT again in the morning. -Placed on meclizine 25 mg 3 times daily as needed for the next 12 hours until patient has been reassessed by vestibular PT again tomorrow morning. -Supportive care.  8.  Hypokalemia/hypomagnesemia -Likely secondary to GI losses. -Potassium at 3.4 this morning.   -Replete.    -  Repeat labs in the AM.  9.  Lipoma -Area of lipoma with no erythema, no warmth, no significant fluctuance noted. -Outpatient follow-up with PCP.   DVT prophylaxis: Lovenox Code Status: Full Family Communication: Updated  patient.  No family at bedside. Disposition: Home when clinically improved.  Status is: Inpatient The patient will require care spanning > 2 midnights and should be moved to inpatient because: Severity of illness   Consultants:  None  Procedures:  CT abdomen pelvis 07/02/2022   Antimicrobials:  None   Subjective: Patient laying in bed.  Overall feeling better.  Still with some loose stools but frequency has improved and consistency is slowly improving.  No chest pain.  No shortness of breath.  Abdominal pain improving.  Tolerating current diet.  Patient complaining of a swelling area on the left buttocks that has been ongoing for the past 2 to 3 weeks however never mentioned it to anyone.  Patient also with some complaints of dizziness/spinning sensation which she states has improved a little bit from admission.      Objective: Vitals:   07/05/22 1723 07/05/22 1914 07/06/22 0440 07/06/22 0808  BP: 118/69 138/86 131/71 123/64  Pulse: 72 67 73 70  Resp: Temp: 98.1 F (36.7 C) 98.3 F (36.8 C) 98.5 F (36.9 C) 98.1 F (36.7 C)  TempSrc: Axillary Oral  Oral  SpO2: 98% 96% 96% 95%  Weight:      Height:        Intake/Output Summary (Last 24 hours) at 07/06/2022 1540 Last data filed at 07/06/2022 1500 Gross per 24 hour  Intake 3572.01 ml  Output 1050 ml  Net 2522.01 ml   Filed Weights   07/02/22 1455  Weight: 116.6 kg    Examination:  General exam: NAD Respiratory system: CTAB.  No wheezes, no crackles, no rhonchi.  Fair air movement.  Speaking in full sentences.  Cardiovascular system: Regular rate rhythm no murmurs rubs or gallops.  No JVD.  No lower extremity edema.   Gastrointestinal system: Abdomen is soft, nontender, nondistended, positive bowel sounds.  No rebound.  No guarding.   GU: Left buttocks area with probable lipoma. Central nervous system: Alert and oriented. No focal neurological deficits. Extremities: Symmetric 5 x 5 power. Skin: No rashes,  lesions or ulcers Psychiatry: Judgement and insight appear normal. Mood & affect appropriate.     Data Reviewed: I have personally reviewed following labs and imaging studies  CBC: Recent Labs  Lab 07/02/22 1504 07/02/22 1529 07/02/22 1647 07/03/22 0233 07/04/22 0038 07/05/22 0443 07/06/22 1125  WBC 10.8*  --   --  6.5 5.7 6.2 6.5  NEUTROABS 9.2*  --   --   --  2.1 2.4  --   HGB 16.3*   < > 17.0* 16.0* 13.0 13.3 13.2  HCT 46.9*   < > 50.0* 46.6* 38.6 38.7 39.0  MCV 91.8  --   --  91.9 93.0 93.3 92.4  PLT 273  --   --  188 186 188 192   < > = values in this interval not displayed.    Basic Metabolic Panel: Recent Labs  Lab 07/03/22 0233 07/03/22 1016 07/04/22 0038 07/05/22 0443 07/06/22 1125  NA 132* 129* 135 134* 138  K 3.8 3.8 3.3* 3.6 3.4*  CL 104 101 108 108 105  CO2 15* 20* 20* 20* 21*  GLUCOSE 304* 350* 179* 151* 195*  BUN 24* 23* CREATININE 1.01* 1.03* 0.76 0.74  0.76  CALCIUM 8.7* 8.5* 8.3* 8.3* 8.7*  MG  --   --  1.4* 2.0  --     GFR: Estimated Creatinine Clearance: 103.7 mL/min (by C-G formula based on SCr of 0.76 mg/dL).  Liver Function Tests: Recent Labs  Lab 07/02/22 1640 07/03/22 0233  AST 51* 32  ALT 57* 45*  ALKPHOS 129* 104  BILITOT 1.2 0.7  PROT 7.1 6.6  ALBUMIN 3.7 3.4*    CBG: Recent Labs  Lab 07/05/22 0821 07/05/22 1247 07/05/22 1616 07/05/22 1912 07/06/22 1229  GLUCAP 159* 255* 229* 171* 220*     Recent Results (from the past 240 hour(s))  Resp panel by RT-PCR (RSV, Flu A&B, Covid) Anterior Nasal Swab     Status: None   Collection Time: 07/02/22  4:40 PM   Specimen: Anterior Nasal Swab  Result Value Ref Range Status   SARS Coronavirus 2 by RT PCR NEGATIVE NEGATIVE Final   Influenza A by PCR NEGATIVE NEGATIVE Final   Influenza B by PCR NEGATIVE NEGATIVE Final    Comment: (NOTE) The Xpert Xpress SARS-CoV-2/FLU/RSV plus assay is intended as an aid in the diagnosis of influenza from Nasopharyngeal swab  specimens and should not be used as a sole basis for treatment. Nasal washings and aspirates are unacceptable for Xpert Xpress SARS-CoV-2/FLU/RSV testing.  Fact Sheet for Patients: BloggerCourse.com  Fact Sheet for Healthcare Providers: SeriousBroker.it  This test is not yet approved or cleared by the [place] FDA and has been authorized for detection and/or diagnosis of SARS-CoV-2 by FDA under an Emergency Use Authorization (EUA). This EUA will remain in effect (meaning this test can be used) for the duration of the COVID-19 declaration under Section 564(b)(1) of the Act, 21 U.S.C. section 360bbb-3(b)(1), unless the authorization is terminated or revoked.     Resp Syncytial Virus by PCR NEGATIVE NEGATIVE Final    Comment: (NOTE) Fact Sheet for Patients: BloggerCourse.com  Fact Sheet for Healthcare Providers: SeriousBroker.it  This test is not yet approved or cleared by the [place] FDA and has been authorized for detection and/or diagnosis of SARS-CoV-2 by FDA under an Emergency Use Authorization (EUA). This EUA will remain in effect (meaning this test can be used) for the duration of the COVID-19 declaration under Section 564(b)(1) of the Act, 21 U.S.C. section 360bbb-3(b)(1), unless the authorization is terminated or revoked.  Performed at [facility], [address], [place], [place] [address]   C Difficile Quick Screen w PCR reflex     Status: None   Collection Time: 07/03/22  4:40 PM   Specimen: STOOL  Result Value Ref Range Status   C Diff antigen NEGATIVE NEGATIVE Final   C Diff toxin NEGATIVE NEGATIVE Final   C Diff interpretation No C. difficile detected.  Final    Comment: Performed at [facility], [address] [place], [place] [address]         Radiology Studies: No results found.      Scheduled Meds:  busPIRone  10 mg  Oral BID   carisoprodol  350 mg Oral QID   enoxaparin (LOVENOX) injection  40 mg Subcutaneous Q24H   fentaNYL  1 patch Transdermal Q72H   gabapentin  600 mg Oral TID   insulin aspart  0-15 Units Subcutaneous TID WC   insulin glargine-yfgn  25 Units Subcutaneous BID   levETIRAcetam  750 mg Oral TID   levothyroxine  150 mcg Oral Q0600   lidocaine  2 patch Transdermal QHS  loratadine  10 mg Oral Daily   pantoprazole  40 mg Oral Daily   potassium chloride  40 mEq Oral Once   sertraline  50 mg Oral QHS   Continuous Infusions:  sodium chloride Stopped (07/06/22 1404)     LOS: 3 days    Time spent: 40 minutes    Ramiro Harvest, MD [facility]   To contact the attending provider between 7A-7P or the covering provider during after hours 7P-7A, please log into the web site [email or web address] and access using universal [place] password for that web site. If you do not have the password, please call the hospital operator.  07/06/2022, 3:40 PM

## 2022-07-07 DIAGNOSIS — R112 Nausea with vomiting, unspecified: Secondary | ICD-10-CM | POA: Diagnosis not present

## 2022-07-07 DIAGNOSIS — E1165 Type 2 diabetes mellitus with hyperglycemia: Secondary | ICD-10-CM | POA: Diagnosis not present

## 2022-07-07 DIAGNOSIS — H8110 Benign paroxysmal vertigo, unspecified ear: Secondary | ICD-10-CM | POA: Diagnosis not present

## 2022-07-07 DIAGNOSIS — E876 Hypokalemia: Secondary | ICD-10-CM | POA: Diagnosis not present

## 2022-07-07 LAB — BASIC METABOLIC PANEL
Anion gap: 5 (ref 5–15)
BUN: 7 mg/dL (ref 6–20)
CO2: 23 mmol/L (ref 22–32)
Calcium: 8.7 mg/dL — ABNORMAL LOW (ref 8.9–10.3)
Chloride: 107 mmol/L (ref 98–111)
Creatinine, Ser: 0.76 mg/dL (ref 0.44–1.00)
GFR, Estimated: >60 mL/min (ref >60)
Glucose, Bld: 192 mg/dL — ABNORMAL HIGH (ref 70–99)
Potassium: 3.7 mmol/L (ref 3.5–5.1)
Sodium: 135 mmol/L (ref 135–145)

## 2022-07-07 LAB — CBC WITH DIFFERENTIAL/PLATELET
Abs Immature Granulocytes: 0.02 10*3/uL (ref 0.00–0.07)
Basophils Absolute: 0 10*3/uL (ref 0.0–0.1)
Basophils Relative: 0 %
Eosinophils Absolute: 0 10*3/uL (ref 0.0–0.5)
Eosinophils Relative: 0 %
HCT: 37.7 % (ref 36.0–46.0)
Hemoglobin: 13.3 g/dL (ref 12.0–15.0)
Immature Granulocytes: 0 %
Lymphocytes Relative: 50 %
Lymphs Abs: 3.6 10*3/uL (ref 0.7–4.0)
MCH: 31.8 pg (ref 26.0–34.0)
MCHC: 35.3 g/dL (ref 30.0–36.0)
MCV: 90.2 fL (ref 80.0–100.0)
Monocytes Absolute: 0.6 10*3/uL (ref 0.1–1.0)
Monocytes Relative: 8 %
Neutro Abs: 3 10*3/uL (ref 1.7–7.7)
Neutrophils Relative %: 42 %
Platelets: 202 10*3/uL (ref 150–400)
RBC: 4.18 MIL/uL (ref 3.87–5.11)
RDW: 12.8 % (ref 11.5–15.5)
WBC: 7.2 10*3/uL (ref 4.0–10.5)
nRBC: 0 % (ref 0.0–0.2)

## 2022-07-07 LAB — GLUCOSE, CAPILLARY
Glucose-Capillary: 165 mg/dL — ABNORMAL HIGH (ref 70–99)
Glucose-Capillary: 178 mg/dL — ABNORMAL HIGH (ref 70–99)
Glucose-Capillary: 232 mg/dL — ABNORMAL HIGH (ref 70–99)
Glucose-Capillary: 224 mg/dL — ABNORMAL HIGH (ref 70–99)

## 2022-07-07 LAB — MAGNESIUM: Magnesium: 1.3 mg/dL — ABNORMAL LOW (ref 1.7–2.4)

## 2022-07-07 MED ORDER — OXYCODONE HCL 5 MG PO TABS
10.0000 mg | ORAL_TABLET | ORAL | Status: DC | PRN
  Administered 2022-07-07 – 2022-07-09 (×11): 10 mg via ORAL
  Filled 2022-07-07 (×11): qty 2

## 2022-07-07 MED ORDER — NALOXONE HCL 0.4 MG/ML IJ SOLN: 0.4000 mg | INTRAMUSCULAR | Status: DC | PRN

## 2022-07-07 MED ORDER — MECLIZINE HCL 25 MG PO TABS: 25.0000 mg | ORAL_TABLET | Freq: Three times a day (TID) | ORAL | Status: AC | PRN

## 2022-07-07 MED ORDER — MORPHINE SULFATE (PF) 2 MG/ML IV SOLN: 1.0000 mg | Freq: Once | INTRAVENOUS | Status: DC | PRN

## 2022-07-07 MED ORDER — MORPHINE SULFATE (PF) 2 MG/ML IV SOLN
1.0000 mg | Freq: Once | INTRAVENOUS | Status: AC | PRN
  Administered 2022-07-07: 1 mg via INTRAVENOUS
  Filled 2022-07-07 (×2): qty 1

## 2022-07-07 MED ORDER — INSULIN GLARGINE-YFGN 100 UNIT/ML ~~LOC~~ SOLN
28.0000 [IU] | Freq: Two times a day (BID) | SUBCUTANEOUS | Status: DC
  Administered 2022-07-07 – 2022-07-08 (×3): 28 [IU] via SUBCUTANEOUS
  Filled 2022-07-07 (×4): qty 0.28

## 2022-07-07 MED ORDER — MAGNESIUM SULFATE 50 % IJ SOLN
6.0000 g | Freq: Once | INTRAVENOUS | Status: AC
  Administered 2022-07-07: 6 g via INTRAVENOUS
  Filled 2022-07-07: qty 12
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
## 2022-07-07 NOTE — Progress Notes (Signed)
PROGRESS NOTE    [name]  ZOX:[id number] DOB: Jul 26, 1970 DOA: 07/02/2022 PCP: Loyola Mast, NP    Chief Complaint  Patient presents with   Emesis    Brief Narrative:  Patient is a 52 year old female history of type 2 diabetes, chronic pain, hypertension, hypothyroidism, migraines, OA, anxiety depression, GERD presented with persistent nausea vomiting and diarrhea after eating a [employer] pizza with multiple loose stools and inability to keep anything down.  Patient's boyfriend also ate the same pizza also had diarrhea but not as severe as the patient.  Patient seen in the ED noted to be tachycardic, intermittent tachypnea with some hypertension.  Noted to have a blood glucose of 475, COVID-19 PCR, RSV PCR negative.  Patient noted on initial presentation to have a bicarb of 19, anion gap of 16, urine pregnancy negative, CT abdomen and pelvis with no acute abnormalities.  Patient placed on IV antiemetics, IV fluids and admitted.   Assessment & Plan:   Principal Problem:   Intractable vomiting with nausea Active Problems:   Poorly controlled type 2 diabetes mellitus (HCC)   Chronic pain   Depression with anxiety   Seizure   Hyperglycemia   Nausea vomiting and diarrhea   Hypokalemia   BPPV (benign paroxysmal positional vertigo)   #1. intractable nausea vomiting and diarrhea -Likely secondary to foodborne gastroenteritis versus early onset DKA. -Lipase levels not concerning for pancreatitis. -Patient improving clinically, diarrhea improved in terms of frequency and consistency.   -Nausea and vomiting resolved.   -Tolerating carb modified diet.  -C. difficile PCR negative.   -GI pathogen panel pending.   -Saline lock IV fluids.  IV antiemetics, supportive care.   -Imodium as needed.  2.  Type 2 diabetes with hyperglycemia -Patient with noted to admitting physician unable to eat anything, patient noted to be hyperglycemic initially with elevated anion gap and mildly  elevated beta hydroxybutyrate felt likely secondary to starvation ketosis versus early onset DKA. -Labs this morning with bicarb of 23, anion gap of 5.  Glucose of 192. -Hemoglobin A1c 10.0. -Saline lock IV fluids.  -Patient on 70/30 40 units twice daily is what is prescribed however per med rec patient taking 60 units 3 times daily, as well as Ozempic weekly. -CBG noted at 165 this morning. -Increase Semglee to 28 units twice daily.  Continue NovoLog SSI.   -Diabetes coordinator following.    3. hypothyroidism -Synthroid.    4.  Depression and anxiety -Continue Zoloft, BuSpar.   5.  Seizure disorder -Keppra.    6.  Chronic pain -Continue home regimen of fentanyl patch, gabapentin, Soma.   -Patient states she takes oxycodone 10 mg every 4 hours as needed at home and as such we will change back to home regimen of oxycodone 10 mg every 4 hours as needed.   -Outpatient follow-up with pain management.   7.?  Orthostatic hypotension/probable BPPV -Patient with dizziness and lightheadedness from supine to standing position.   Patient states was on medication to help lower blood pressure in the past which was recently discontinued about a week ago by nurse practitioner at her PCPs office. -Patient may be describing a history of chronic orthostatic hypotension and likely may have been on midodrine. -Orthostatics checked was negative.  -Patient hydrated with IV fluids however still with dizziness/spinning sensation. -PT vestibular evaluation ordered, assessed by PT and concerns for BPPV. -Patient to be reassessed by vestibular PT again this morning and findings consistent with BPPV.   -Placed on meclizine as needed  overnight.   -If no further evaluation by vestibular PT tomorrow we will place on meclizine as needed.   -Outpatient vestibular PT follow-up.   -Supportive care.  8.  Hypokalemia/hypomagnesemia -Likely secondary to GI losses. -Repleted, potassium at 3.7 this morning.    -Magnesium at 1.3.   -Magnesium sulfate 6 g IV x 1.   -Repeat labs in the AM.   9.  Lipoma -Area of lipoma with no erythema, no warmth, no significant fluctuance noted. -Outpatient follow-up with PCP.   DVT prophylaxis: Lovenox Code Status: Full Family Communication: Updated patient.  No family at bedside. Disposition: Home when clinically improved hopefully in the next 24 hours.  Status is: Inpatient The patient will require care spanning > 2 midnights and should be moved to inpatient because: Severity of illness   Consultants:  None  Procedures:  CT abdomen pelvis 07/02/2022   Antimicrobials:  None   Subjective: Patient laying in bed.  Patient states has significant pain from the right hip to right foot overnight.  Patient states improvement with abdominal pain and diarrhea.  Tolerating oral intake.  States she has some bulging disc in her L-spine and was told by neurosurgery no further surgery can be done at this time.   Continue current pain management.   Had some dizziness however slowly improving.  Was seen by vestibular PT early on today.  Objective: Vitals:   07/06/22 1735 07/06/22 1906 07/07/22 0412 07/07/22 0930  BP: 125/72 (!) 142/76 132/80 127/80  Pulse: 73 64 73 66  Resp: 18 18 20 19   Temp: 98.6 F (37 C) 97.9 F (36.6 C) 97.9 F (36.6 C) 97.7 F (36.5 C)  TempSrc: Oral Oral Oral Oral  SpO2: 96% 96% 95% 95%  Weight:      Height:        Intake/Output Summary (Last 24 hours) at 07/07/2022 1433 Last data filed at 07/07/2022 1008 Gross per 24 hour  Intake 1530.82 ml  Output 700 ml  Net 830.82 ml    Filed Weights   07/02/22 1455  Weight: 116.6 kg    Examination:  General exam: NAD Respiratory system: Lungs clear to auscultation bilaterally.  No wheezes, no crackles normal rhonchi.  Fair air movement.   Cardiovascular system: RRR no murmurs rubs or gallops.  No JVD.  No lower extremity edema.   Gastrointestinal system: Abdomen is soft,  nontender, nondistended.  Positive bowel sounds.  No rebound.  No guarding.  GU: Left buttocks area with probable lipoma. Central nervous system: Alert and oriented. No focal neurological deficits. Extremities: Symmetric 5 x 5 power. Skin: No rashes, lesions or ulcers Psychiatry: Judgement and insight appear normal. Mood & affect appropriate.     Data Reviewed: I have personally reviewed following labs and imaging studies  CBC: Recent Labs  Lab 07/02/22 1504 07/02/22 1529 07/03/22 0233 07/04/22 0038 07/05/22 0443 07/06/22 1125 07/07/22 0254  WBC 10.8*  --  6.5 5.7 6.2 6.5 7.2  NEUTROABS 9.2*  --   --  2.1 2.4  --  3.0  HGB 16.3*   < > 16.0* 13.0 13.3 13.2 13.3  HCT 46.9*   < > 46.6* 38.6 38.7 39.0 37.7  MCV 91.8  --  91.9 93.0 93.3 92.4 90.2  PLT 273  --  188 186 188 192 202   < > = values in this interval not displayed.     Basic Metabolic Panel: Recent Labs  Lab 07/03/22 1016 07/04/22 0038 07/05/22 0443 07/06/22 1125 07/07/22 0254  NA  129* 135 134* 138 135  K 3.8 3.3* 3.6 3.4* 3.7  CL 101 108 108 105 107  CO2 20* 20* 20* 21* 23  GLUCOSE 350* 179* 151* 195* 192*  BUN 23* CREATININE 1.03* 0.76 0.74 0.76 0.76  CALCIUM 8.5* 8.3* 8.3* 8.7* 8.7*  MG  --  1.4* 2.0  --  1.3*     GFR: Estimated Creatinine Clearance: 103.7 mL/min (by C-G formula based on SCr of 0.76 mg/dL).  Liver Function Tests: Recent Labs  Lab 07/02/22 1640 07/03/22 0233  AST 51* 32  ALT 57* 45*  ALKPHOS 129* 104  BILITOT 1.2 0.7  PROT 7.1 6.6  ALBUMIN 3.7 3.4*     CBG: Recent Labs  Lab 07/06/22 1229 07/06/22 1718 07/06/22 1907 07/07/22 0928 07/07/22 1425  GLUCAP 220* 193* 222* 165* 178*      Recent Results (from the past 240 hour(s))  Resp panel by RT-PCR (RSV, Flu A&B, Covid) Anterior Nasal Swab     Status: None   Collection Time: 07/02/22  4:40 PM   Specimen: Anterior Nasal Swab  Result Value Ref Range Status   SARS Coronavirus 2 by RT PCR NEGATIVE NEGATIVE  Final   Influenza A by PCR NEGATIVE NEGATIVE Final   Influenza B by PCR NEGATIVE NEGATIVE Final    Comment: (NOTE) The Xpert Xpress SARS-CoV-2/FLU/RSV plus assay is intended as an aid in the diagnosis of influenza from Nasopharyngeal swab specimens and should not be used as a sole basis for treatment. Nasal washings and aspirates are unacceptable for Xpert Xpress SARS-CoV-2/FLU/RSV testing.  Fact Sheet for Patients: BloggerCourse.com  Fact Sheet for Healthcare Providers: SeriousBroker.it  This test is not yet approved or cleared by the [place] FDA and has been authorized for detection and/or diagnosis of SARS-CoV-2 by FDA under an Emergency Use Authorization (EUA). This EUA will remain in effect (meaning this test can be used) for the duration of the COVID-19 declaration under Section 564(b)(1) of the Act, 21 U.S.C. section 360bbb-3(b)(1), unless the authorization is terminated or revoked.     Resp Syncytial Virus by PCR NEGATIVE NEGATIVE Final    Comment: (NOTE) Fact Sheet for Patients: BloggerCourse.com  Fact Sheet for Healthcare Providers: SeriousBroker.it  This test is not yet approved or cleared by the [place] FDA and has been authorized for detection and/or diagnosis of SARS-CoV-2 by FDA under an Emergency Use Authorization (EUA). This EUA will remain in effect (meaning this test can be used) for the duration of the COVID-19 declaration under Section 564(b)(1) of the Act, 21 U.S.C. section 360bbb-3(b)(1), unless the authorization is terminated or revoked.  Performed at [facility], [address], [place], [place] [address]   C Difficile Quick Screen w PCR reflex     Status: None   Collection Time: 07/03/22  4:40 PM   Specimen: STOOL  Result Value Ref Range Status   C Diff antigen NEGATIVE NEGATIVE Final   C Diff toxin NEGATIVE NEGATIVE Final   C  Diff interpretation No C. difficile detected.  Final    Comment: Performed at [facility], [address], [place], [place] [address]         Radiology Studies: No results found.      Scheduled Meds:  busPIRone  10 mg Oral BID   carisoprodol  350 mg Oral QID   enoxaparin (LOVENOX) injection  40 mg Subcutaneous Q24H   fentaNYL  1 patch Transdermal Q72H   gabapentin  600 mg Oral TID   insulin aspart  0-15 Units Subcutaneous TID WC   insulin aspart  0-5 Units Subcutaneous QHS   insulin glargine-yfgn  28 Units Subcutaneous BID   levETIRAcetam  750 mg Oral TID   levothyroxine  150 mcg Oral Q0600   lidocaine  2 patch Transdermal QHS   loratadine  10 mg Oral Daily   pantoprazole  40 mg Oral Daily   sertraline  50 mg Oral QHS   Continuous Infusions:     LOS: 4 days    Time spent: 40 minutes    Ramiro Harvest, MD [facility]   To contact the attending provider between 7A-7P or the covering provider during after hours 7P-7A, please log into the web site [email or web address] and access using universal [place] password for that web site. If you do not have the password, please call the hospital operator.  07/07/2022, 2:33 PM

## 2022-07-07 NOTE — Progress Notes (Signed)
PT Cancellation Note  Patient Details Name: [name] MRN: [id number] DOB: October 07, 1970   Cancelled Treatment:    Reason Eval/Treat Not Completed: (P) Patient declined, no reason specified. Returned for second session to assess progress with vertigo and perform further BPPV treatment as needed. However, pt on the toilet and reporting "I don't think I can. I just feel so sick right now", politely declining BPPV treatment at this time. Pt requesting PT hold off on vestibular treatment this evening and defer it to tomorrow morning as able.   [name], PT, DPT Acute Rehabilitation Services  Office: [phone]    [name] 07/07/2022, 4:56 PM

## 2022-07-08 ENCOUNTER — Other Ambulatory Visit (HOSPITAL_COMMUNITY): Payer: Self-pay

## 2022-07-08 DIAGNOSIS — E1165 Type 2 diabetes mellitus with hyperglycemia: Secondary | ICD-10-CM | POA: Diagnosis not present

## 2022-07-08 DIAGNOSIS — H8110 Benign paroxysmal vertigo, unspecified ear: Secondary | ICD-10-CM | POA: Diagnosis not present

## 2022-07-08 DIAGNOSIS — F418 Other specified anxiety disorders: Secondary | ICD-10-CM | POA: Diagnosis not present

## 2022-07-08 DIAGNOSIS — R45851 Suicidal ideations: Secondary | ICD-10-CM

## 2022-07-08 DIAGNOSIS — R112 Nausea with vomiting, unspecified: Secondary | ICD-10-CM | POA: Diagnosis not present

## 2022-07-08 LAB — GLUCOSE, CAPILLARY
Glucose-Capillary: 170 mg/dL — ABNORMAL HIGH (ref 70–99)
Glucose-Capillary: 200 mg/dL — ABNORMAL HIGH (ref 70–99)
Glucose-Capillary: 199 mg/dL — ABNORMAL HIGH (ref 70–99)
Glucose-Capillary: 209 mg/dL — ABNORMAL HIGH (ref 70–99)
Glucose-Capillary: 271 mg/dL — ABNORMAL HIGH (ref 70–99)
Glucose-Capillary: 288 mg/dL — ABNORMAL HIGH (ref 70–99)

## 2022-07-08 LAB — BASIC METABOLIC PANEL
Anion gap: 9 (ref 5–15)
BUN: 9 mg/dL (ref 6–20)
CO2: 25 mmol/L (ref 22–32)
Calcium: 9.1 mg/dL (ref 8.9–10.3)
Chloride: 101 mmol/L (ref 98–111)
Creatinine, Ser: 0.85 mg/dL (ref 0.44–1.00)
GFR, Estimated: >60 mL/min (ref >60)
Glucose, Bld: 237 mg/dL — ABNORMAL HIGH (ref 70–99)
Potassium: 3.7 mmol/L (ref 3.5–5.1)
Sodium: 135 mmol/L (ref 135–145)

## 2022-07-08 LAB — MAGNESIUM: Magnesium: 1.8 mg/dL (ref 1.7–2.4)

## 2022-07-08 MED ORDER — MECLIZINE HCL 25 MG PO TABS
25.0000 mg | ORAL_TABLET | Freq: Three times a day (TID) | ORAL | 0 refills | Status: AC | PRN
  Filled 2022-07-08: qty 30, 10d supply, fill #0

## 2022-07-08 MED ORDER — INSULIN GLARGINE-YFGN 100 UNIT/ML ~~LOC~~ SOLN
32.0000 [IU] | Freq: Two times a day (BID) | SUBCUTANEOUS | Status: DC
  Administered 2022-07-09: 32 [IU] via SUBCUTANEOUS
  Filled 2022-07-08 (×3): qty 0.32

## 2022-07-08 MED ORDER — MAGNESIUM SULFATE 2 GM/50ML IV SOLN
2.0000 g | Freq: Once | INTRAVENOUS | Status: AC
  Administered 2022-07-08: 2 g via INTRAVENOUS
  Filled 2022-07-08: qty 50

## 2022-07-08 MED ORDER — MECLIZINE HCL 25 MG PO TABS
25.0000 mg | ORAL_TABLET | Freq: Three times a day (TID) | ORAL | Status: DC | PRN
  Administered 2022-07-08: 25 mg via ORAL
  Filled 2022-07-08 (×3): qty 1

## 2022-07-08 MED ORDER — MECLIZINE HCL 25 MG PO TABS
25.0000 mg | ORAL_TABLET | Freq: Three times a day (TID) | ORAL | 0 refills | Status: DC | PRN
  Filled 2022-07-08: qty 20, 7d supply, fill #0

## 2022-07-08 NOTE — Progress Notes (Signed)
Patient refusing to wear paper scrubs. RN stating if there is reason for this "They are going to make me sweat I don't want them." RN educated of importance however patient still refusing. Patient refusing insulin at this time. RN educated importance of taking insulin.Patient still declining at this time.

## 2022-07-08 NOTE — Care Plan (Signed)
Was told by RN that just prior to discharge patient met with chaplain and expressing suicidal ideation and stated she had a plan.  RN went and discussed with patient again and patient did express that she did have a suicidal plan however still wanted to be discharged home. -Due to concern for suicidal ideation and plan will cancel discharge and place a psych consultation for evaluation.  Will need a one-to-one sitter.  No charge.

## 2022-07-08 NOTE — Progress Notes (Signed)
   07/08/22 1647  Spiritual Encounters  Type of Visit Initial  Care provided to: Patient  Referral source Nurse (RN/NT/LPN)  Reason for visit Urgent spiritual support  OnCall Visit No  Spiritual Framework  Presenting Themes Values and beliefs;Significant life change  Patient Stress Factors Loss  Family Stress Factors Not reviewed  Interventions  Spiritual Care Interventions Made Established relationship of care and support;Compassionate presence;Reflective listening;Normalization of emotions;Explored ethical dilemma;Narrative/life review;Explored values/beliefs/practices/strengths;Meaning making;Bereavement/grief support;Encouragement  Intervention Outcomes  Outcomes Connection to spiritual care;Awareness of support;Reduced fear;Reduced isolation;Reduced anxiety   Chaplain Christyl Osentoski provided spiritual and emotional support for the patient. Patient stated that she is suicidal and has a plan. Patient has recently lost her brother as well as her dog.  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 The nurse stated she would let the doctor know.   Note prepared by Arlyce Dice, Chaplain Resident [phone]

## 2022-07-08 NOTE — Progress Notes (Signed)
Upon discharge Chaplain informed RN of patient verbalizing suicidal ideation. RN spoke with patient about this and she stated she was in fact feeling suicidal and had a plan. She would not disclose what plan was when asked by RN. RN informed MD and discharge has been cancelled. RN informed patient about this and she verbalized understanding. Updated staffing and [facility] about need for sitter at this time.

## 2022-07-08 NOTE — TOC Initial Note (Signed)
Transition of Care [facility]) - Initial/Assessment Note    Patient Details  Name: [name] MRN: [id number] Date of Birth: 1970-12-01  Transition of Care [facility]) CM/SW Contact:    Harriet Masson, RN Phone Number: 07/08/2022, 2:46 PM  Clinical Narrative:                 Spoke to patient regarding transition needs.  Patient is agreeable to OP-rehab and requesting location be at [facility]. Patient states she will take uber to [place].  Spiritual care consult added for advance directives.  If patient is discharged home today she thinks she can find transportation.  TOC following.   Expected Discharge Plan: OP Rehab Barriers to Discharge: Barriers Resolved   Patient Goals and CMS Choice Patient states their goals for this hospitalization and ongoing recovery are:: return home CMS Medicare.gov Compare Post Acute Care list provided to:: Patient Choice offered to / list presented to : Patient      Expected Discharge Plan and Services                                              Prior Living Arrangements/Services   Lives with:: Significant Other, Self Patient language and need for interpreter reviewed:: Yes        Need for Family Participation in Patient Care: Yes (Comment) Care giver support system in place?: Yes (comment) Current home services: DME Criminal Activity/Legal Involvement Pertinent to Current Situation/Hospitalization: No - Comment as needed  Activities of Daily Living Home Assistive Devices/Equipment: Cane (specify quad or straight), Walker (specify type) ADL Screening (condition at time of admission) Patient's cognitive ability adequate to safely complete daily activities?: Yes Is the patient deaf or have difficulty hearing?: No Does the patient have difficulty seeing, even when wearing glasses/contacts?: No Does the patient have difficulty concentrating, remembering, or making decisions?: No Patient able to express need for assistance with  ADLs?: No Does the patient have difficulty dressing or bathing?: No Independently performs ADLs?: Yes (appropriate for developmental age) Does the patient have difficulty walking or climbing stairs?: Yes Weakness of Legs: Right Weakness of Arms/Hands: None  Permission Sought/Granted                  Emotional Assessment Appearance:: Appears stated age Attitude/Demeanor/Rapport: Gracious Affect (typically observed): Accepting Orientation: : Oriented to Self, Oriented to Place, Oriented to  Time, Oriented to Situation Alcohol / Substance Use: Not Applicable Psych Involvement: No (comment)  Admission diagnosis:  Hyperglycemia [R73.9] Nausea vomiting and diarrhea [R11.2, R19.7] Intractable vomiting with nausea [R11.2] Patient Active Problem List   Diagnosis Date Noted   BPPV (benign paroxysmal positional vertigo) 07/06/2022   Hypokalemia 07/04/2022   Nausea vomiting and diarrhea 07/03/2022   Intractable vomiting with nausea 07/02/2022   Hyperglycemia 07/02/2022   Dysuria 09/27/2021   Hypomagnesemia 09/22/2021   Anxiety 09/21/2021   Hypotension 09/20/2021   Rhabdomyolysis 09/18/2021   Physical deconditioning 05/16/2021   Hyponatremia 05/16/2021   Hyperkalemia 05/16/2021   Chronically on opiate therapy 05/14/2021   Seizure 03/07/2020   Depression with anxiety 03/25/2019   Umbilical hernia, incarcerated, s/p repair 10/16/2017 10/16/2017   Chronic pain of both knees 04/23/2017   Unilateral primary osteoarthritis, left knee 10/24/2016   Unilateral primary osteoarthritis, right knee 10/24/2016   Abnormal mammogram with microcalcification-Right inner lower quadrant 07/21/2013   Constipation 03/02/2011   Heel ulcer  due to DM 02/19/2011   Wound, open, hip or thigh 02/19/2011   Healing pressure ulcer stage III (HCC) 02/18/2011   Morbidly obese (HCC) 02/18/2011   Poorly controlled type 2 diabetes mellitus (HCC) 02/18/2011   Sleep apnea 02/18/2011   Hypothyroidism 02/18/2011    Chronic pain 02/18/2011   PCP:  Loyola Mast, NP Pharmacy:   [facility] [phone] - [place], [place] - [address] AT [facility] [address] [place] [address] Phone: [phone] Fax: [phone]  [facility] [address] [place] [address] Phone: [phone] Fax: [phone]     Social Determinants of Health (SDOH) Social History: SDOH Screenings   Food Insecurity: No Food Insecurity (07/03/2022)  Housing: Low Risk  (07/03/2022)  Transportation Needs: No Transportation Needs (07/03/2022)  Utilities: Not At Risk (07/03/2022)  Depression (PHQ2-9): High Risk (01/31/2022)  Tobacco Use: Low Risk  (07/03/2022)   SDOH Interventions:     Readmission Risk Interventions    09/25/2021    1:28 PM  Readmission Risk Prevention Plan  Transportation Screening Complete  PCP or Specialist Appt within 5-7 Days Complete  Home Care Screening Complete  Medication Review (RN CM) Complete

## 2022-07-08 NOTE — Discharge Summary (Signed)
Physician Discharge Summary  [name] RWE:[id number] DOB: 1970-07-28 DOA: 07/02/2022  PCP: Loyola Mast, NP  Admit date: 07/02/2022 Discharge date: 07/08/2022  Time spent: 60 minutes  Recommendations for Outpatient Follow-up:  Follow-up with Loyola Mast, NP in 2 weeks.  On follow-up patient's diabetes will need to be reassessed on follow-up.  Patient will need a basic metabolic profile, magnesium level checked to follow-up on electrolytes and renal function.  Lipoma will need to be followed up upon.  BPPV will need to be followed up upon. Follow-up with outpatient PT vestibular for BPPV.   Discharge Diagnoses:  Principal Problem:   Intractable vomiting with nausea Active Problems:   Poorly controlled type 2 diabetes mellitus (HCC)   Chronic pain   Depression with anxiety   Seizure   Hyperglycemia   Nausea vomiting and diarrhea   Hypokalemia   BPPV (benign paroxysmal positional vertigo)   Discharge Condition: Stable and improved.  Diet recommendation: Carb modified diet  Filed Weights   07/02/22 1455  Weight: 116.6 kg    History of present illness:  HPI per Dr. Loren Racer is a 52 y.o. female with medical history significant of type 2 diabetes, chronic pain, hypertension, hypothyroidism, migraines, osteoarthritis, anxiety and depression, GERD who presents for persistent nausea, vomiting and diarrhea.  She ate [employer] pizza and then the next day she started having persistent nausea, vomiting and diarrhea.  States that she has had "a 100" episodes of diarrhea and about 15-20 episodes of vomiting since then.  Her boyfriend who also ate the pizza has diarrhea.  Additionally, she is having headache, back pain, bilateral knee pain.  Denies fever, rhinorrhea, nasal congestion.  Endorses cough.   In ED, she was tachycardic and intermittently tachypneic and hypertensive.  Labs were reviewed and significant for: BHB 0.63, CBG 475, COVID, influenza and RSV negative,  sodium 131, potassium 3.8, serum creatinine 1.09, AST 51, ALT 57, ALP 129, pH 7.34, pCO2 36.7, bicarb 19.9, anion gap 16, lipase 31, urine pregnancy negative despite mild elevation in beta hCG, glucosuria without acute cystitis.  She received 10 units NovoLog, Dilaudid 1 mg, morphine 4 mg, promethazine 12.5 mg, and 2 x 1 L bolus of NS.  CT abdomen pelvis showed no acute processes.  ED provider consulted hospitalist group for intractable nausea and vomiting.  Hospital Course:  #1. intractable nausea vomiting and diarrhea -Likely secondary to foodborne gastroenteritis versus early onset DKA. -Lipase levels not concerning for pancreatitis. -Patient improved clinically, diarrhea improved in terms of frequency and consistency.   -Nausea and vomiting resolved.   -Patient initially was on a clear liquid diet and diet advanced to a carb modified diet which she tolerated.   -C. difficile PCR negative.  -Patient also placed on Imodium as needed. -Patient improved clinically and will be discharged in stable and improved condition.   2.  Type 2 diabetes with hyperglycemia -Patient with noted to admitting physician unable to eat anything, patient noted to be hyperglycemic initially with elevated anion gap and mildly elevated beta hydroxybutyrate felt likely secondary to starvation ketosis versus early onset DKA. -Labs improved during the hospitalization.  -Hemoglobin A1c 10.0. -Patient adequately hydrated with IV fluids during the hospitalization. -Patient on 70/30 40 units twice daily is what is prescribed however per med rec patient taking 60 units 3 times daily, as well as Ozempic weekly. -Patient maintained on Semglee during the hospitalization and dose adjusted for better blood glucose control patient was maintained on Semglee 28 units twice  daily in addition to NovoLog sliding scale insulin.   -Patient seen by diabetic coordinator.   -Outpatient follow-up with PCP.    3. hypothyroidism -Patient  maintained on home regimen Synthroid.     4.  Depression and anxiety -Patient maintained on home regimen Zoloft, BuSpar.    5.  Seizure disorder -Continued on home regimen Keppra.     6.  Chronic pain -Patient was maintained on home regimen of fentanyl patch, gabapentin, Soma.   -Patient stated she takes oxycodone 10 mg every 4 hours as needed at home and as such oxycodone was changed to her home regimen.  -Outpatient follow-up with her pain clinic.     7.BPPV -Patient with dizziness and lightheadedness from supine to standing position.   Patient stated was on medication to help lower blood pressure in the past which was recently discontinued about a week ago by nurse practitioner at her PCPs office. -Patient may be describing a history of chronic orthostatic hypotension and likely may have been on midodrine. -Orthostatics checked was negative.  -Patient hydrated with IV fluids however still with dizziness/spinning sensation. -PT vestibular evaluation ordered, assessed by PT and concerns for BPPV. -Patient reassessed by vestibular PT 2 more times during the hospitalization and findings were consistent with BPPV and recommended outpatient vestibular PT follow-up.   -Patient placed on meclizine as needed.   -Cleared will be discharged in stable and improved condition with outpatient follow-up with PCP and outpatient PT vestibular.    8.  Hypokalemia/hypomagnesemia -Likely secondary to GI losses. -Repleted during the hospitalization. -Outpatient follow-up with PCP.   9.  Lipoma -Area of lipoma with no erythema, no warmth, no significant fluctuance noted. -Outpatient follow-up with PCP.      Procedures: CT abdomen pelvis 07/02/2022  Consultations: None  Discharge Exam: Vitals:   07/08/22 0834 07/08/22 1048  BP: 127/69 135/72  Pulse:  80  Resp: 17 (!) 21  Temp: 98.1 F (36.7 C) 98 F (36.7 C)  SpO2:  95%    General: NAD Cardiovascular: RRR no murmurs rubs or gallops.   No JVD.  No lower extremity edema. Respiratory: Clear to auscultation bilaterally.  No wheezes, no crackles, no rhonchi.  Fair air movement.  Speaking in full sentences.  Discharge Instructions   Discharge Instructions     Ambulatory referral to Physical Therapy   Complete by: As directed    Needs vestibular rehab for BPPV.   Ambulatory referral to Physical Therapy   Complete by: As directed    Diet Carb Modified   Complete by: As directed    Increase activity slowly   Complete by: As directed       Allergies as of 07/08/2022       Reactions   Penicillins Hives, Other (See Comments)   HAS PT DEVELOPED SEVERE RASH INVOLVING MUCUS MEMBRANES or SKIN NECROSIS: #  #  YES  #  # PATIENT HAS HAD A PCN REACTION THAT REQUIRED HOSPITALIZATION:  #  #  YES  #  #  Tolerates amoxicillin on multiple occasions per Dr. Jacinto Halim note 11/01/17.  TDD.   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   Aspirin Low Dose 81 MG tablet Generic drug: aspirin EC Take 81 mg by mouth daily.   atorvastatin 20 MG tablet Commonly known as: LIPITOR Take 20 mg by mouth daily.   Biotin 11914 MCG Tabs Take 10,000 mcg by mouth daily.   busPIRone 10 MG tablet Commonly known as: BUSPAR Take 10 mg by mouth 2 (two) times daily. What changed: Another medication with the same name was removed. Continue taking this medication, and follow the  directions you see here.   carisoprodol 350 MG tablet Commonly known as: SOMA Take 350 mg by mouth 4 (four) times daily.   cetirizine 10 MG tablet Commonly known as: ZYRTEC Take 10 mg by mouth daily.   DULoxetine 60 MG capsule Commonly known as: CYMBALTA Take 1 capsule (60 mg total) by mouth daily. What changed:  when to take this Another medication with the same name was removed. Continue taking this medication, and follow the directions you see here.   eletriptan 40 MG tablet Commonly known as: RELPAX Take 40 mg by mouth every 2 (two) hours as needed for headache.   fentaNYL 50 MCG/HR Commonly known as: DURAGESIC Place 1 patch onto the skin every 3 (three) days.   gabapentin 600 MG tablet Commonly known as: NEURONTIN Take 0.5 tablets (300 mg total) by mouth at bedtime. What changed:  how much to take when to take this   levETIRAcetam 750 MG tablet Commonly known as: KEPPRA Take 750 mg by mouth in the morning, at noon, and at bedtime.   levothyroxine 150 MCG tablet Commonly known as: SYNTHROID Take 150 mcg by mouth daily before breakfast.   lidocaine 5 % Commonly known as: LIDODERM Place 3 patches onto the skin See admin instructions. Place 3 patches daily to affected areas and replace every 24 hours   lisinopril 10 MG tablet Commonly known as: ZESTRIL Take 10 mg by mouth daily.   meclizine 25 MG tablet Commonly known as: ANTIVERT Take 1 tablet (25 mg total) by mouth 3 (three) times daily as needed for dizziness.   NexIUM 40 MG capsule Generic drug: esomeprazole Take 40 mg by mouth daily.   NovoLOG Mix 70/30 FlexPen (70-30) 100 UNIT/ML FlexPen Generic drug: insulin aspart protamine - aspart Inject 40 Units into the skin 2 (two) times daily with a meal. What changed:  how much to take when to take this   omega-3 acid ethyl esters 1 g capsule Commonly known as: LOVAZA Take 1 capsule by mouth 2 (two) times daily.   Oxycodone HCl 10 MG Tabs Take 1  tablet (10 mg total) by mouth every 6 (six) hours as needed (for pain). What changed: when to take this   Ozempic (1 MG/DOSE) 4 MG/3ML Sopn Generic drug: Semaglutide (1 MG/DOSE) Inject 1 mg into the skin once a week. mondays   promethazine 25 MG tablet Commonly known as: PHENERGAN Take 25 mg by mouth every 8 (eight) hours as needed for nausea or vomiting.   sertraline 50 MG tablet Commonly known as: ZOLOFT Take 50 mg by mouth at bedtime.   triamcinolone cream 0.1 % Commonly known as: KENALOG Apply 1 application topically 2 (two) times daily as  needed (skin rash).   Vitamin D3 50 MCG (2000 UT) Tabs Take 1 tablet by mouth daily.   Vitamin Deficiency System-B12 1000 MCG/ML Kit Generic drug: Cyanocobalamin Inject 1,000 mcg as directed every 30 (thirty) days.   WesTab Plus 27-1 MG Tabs Take 1 tablet by mouth daily.       Allergies  Allergen Reactions   Penicillins Hives and Other (See Comments)    HAS PT DEVELOPED SEVERE RASH INVOLVING MUCUS MEMBRANES or SKIN NECROSIS: #  #  YES  #  # PATIENT HAS HAD A PCN REACTION THAT REQUIRED HOSPITALIZATION:  #  #  YES  #  #   Tolerates amoxicillin on multiple occasions per Dr. Jacinto Halim note 11/01/17.  TDD.   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 Schedule an appointment as soon as possible for a visit.   Specialty: Rehabilitation Why: Call to schedule apt for physical therapy Contact information: [address] [facility] [id number] mc [place] [address] [phone]        Loyola Mast, NP. Schedule an appointment as soon as  possible for a visit in 2 week(s).   Specialty: Family Medicine Contact information: [address] [place] [address] [phone]                  The results of significant diagnostics from this hospitalization (including imaging, microbiology, ancillary and laboratory) are listed below for reference.    Significant Diagnostic Studies: CT ABDOMEN PELVIS W CONTRAST  Result Date: 07/02/2022 CLINICAL DATA:  Vomiting and abdominal pain. EXAM: CT ABDOMEN AND PELVIS WITH CONTRAST TECHNIQUE: Multidetector CT imaging of the abdomen and pelvis was performed using the standard protocol following bolus administration of intravenous contrast. RADIATION DOSE REDUCTION: This exam was performed according to the departmental dose-optimization program which includes automated exposure control, adjustment of the mA and/or kV according to patient size and/or use of iterative reconstruction technique. CONTRAST:  75mL OMNIPAQUE IOHEXOL 350 MG/ML SOLN COMPARISON:  May 14, 2021 FINDINGS: Lower chest: Mild atelectasis is seen within the posterior aspect of the right lung base. Hepatobiliary: No focal liver abnormality is seen. Status post cholecystectomy. No biliary dilatation. Pancreas: Unremarkable. No pancreatic ductal dilatation or surrounding inflammatory changes. Spleen: Normal in size without focal abnormality. Adrenals/Urinary Tract: Adrenal glands are unremarkable. Kidneys are normal in size, without renal calculi or hydronephrosis. A 1.3 cm simple cyst is seen within the posterior aspect of the upper pole of the left kidney. Bladder is unremarkable. Stomach/Bowel: Stomach is within normal limits. Appendix appears normal. No evidence of bowel wall thickening, distention, or inflammatory changes. Vascular/Lymphatic: No significant vascular findings are present. Stable right upper quadrant and mid line upper abdomen lymphadenopathy is noted. Reproductive: A properly positioned IUD is  seen within an otherwise normal appearing uterus. The bilateral adnexa are unremarkable. Other: No abdominal wall hernia or abnormality. No abdominopelvic ascites. Musculoskeletal: A stable 7.8 cm x 3.2 cm, likely benign mixed attenuation, partially calcified mass is seen within the subcutaneous soft tissues of the posterior pelvic wall, to  the left of midline. No acute osseous abnormalities are identified. IMPRESSION: 1. No acute or active process within the abdomen or pelvis. 2. Evidence of prior cholecystectomy. 3. Small simple cyst within the left kidney. No follow-up imaging is recommended. This recommendation follows ACR consensus guidelines: Management of the Incidental Renal Mass on CT: A White Paper of the ACR Incidental Findings Committee. J Am Coll Radiol [phone]. Electronically Signed   By: Aram Candela M.D.   On: 07/02/2022 21:15    Microbiology: Recent Results (from the past 240 hour(s))  Resp panel by RT-PCR (RSV, Flu A&B, Covid) Anterior Nasal Swab     Status: None   Collection Time: 07/02/22  4:40 PM   Specimen: Anterior Nasal Swab  Result Value Ref Range Status   SARS Coronavirus 2 by RT PCR NEGATIVE NEGATIVE Final   Influenza A by PCR NEGATIVE NEGATIVE Final   Influenza B by PCR NEGATIVE NEGATIVE Final    Comment: (NOTE) The Xpert Xpress SARS-CoV-2/FLU/RSV plus assay is intended as an aid in the diagnosis of influenza from Nasopharyngeal swab specimens and should not be used as a sole basis for treatment. Nasal washings and aspirates are unacceptable for Xpert Xpress SARS-CoV-2/FLU/RSV testing.  Fact Sheet for Patients: BloggerCourse.com  Fact Sheet for Healthcare Providers: SeriousBroker.it  This test is not yet approved or cleared by the [place] FDA and has been authorized for detection and/or diagnosis of SARS-CoV-2 by FDA under an Emergency Use Authorization (EUA). This EUA will remain in effect  (meaning this test can be used) for the duration of the COVID-19 declaration under Section 564(b)(1) of the Act, 21 U.S.C. section 360bbb-3(b)(1), unless the authorization is terminated or revoked.     Resp Syncytial Virus by PCR NEGATIVE NEGATIVE Final    Comment: (NOTE) Fact Sheet for Patients: BloggerCourse.com  Fact Sheet for Healthcare Providers: SeriousBroker.it  This test is not yet approved or cleared by the [place] FDA and has been authorized for detection and/or diagnosis of SARS-CoV-2 by FDA under an Emergency Use Authorization (EUA). This EUA will remain in effect (meaning this test can be used) for the duration of the COVID-19 declaration under Section 564(b)(1) of the Act, 21 U.S.C. section 360bbb-3(b)(1), unless the authorization is terminated or revoked.  Performed at [facility], [address], [place], [place] [address]   C Difficile Quick Screen w PCR reflex     Status: None   Collection Time: 07/03/22  4:40 PM   Specimen: STOOL  Result Value Ref Range Status   C Diff antigen NEGATIVE NEGATIVE Final   C Diff toxin NEGATIVE NEGATIVE Final   C Diff interpretation No C. difficile detected.  Final    Comment: Performed at [facility], [address] [place], [place] [address]     Labs: Basic Metabolic Panel: Recent Labs  Lab 07/04/22 0038 07/05/22 0443 07/06/22 1125 07/07/22 0254 07/08/22 0329  NA 135 134* 138 135 135  K 3.3* 3.6 3.4* 3.7 3.7  CL 108 108 105 107 101  CO2 20* 20* 21* 23 25  GLUCOSE 179* 151* 195* 192* 237*  BUN 16 7 9 7 9   CREATININE 0.76 0.74 0.76 0.76 0.85  CALCIUM 8.3* 8.3* 8.7* 8.7* 9.1  MG 1.4* 2.0  --  1.3* 1.8   Liver Function Tests: Recent Labs  Lab 07/02/22 1640 07/03/22 0233  AST 51* 32  ALT 57* 45*  ALKPHOS 129* 104  BILITOT 1.2 0.7  PROT 7.1 6.6  ALBUMIN 3.7  3.4*   Recent Labs  Lab 07/02/22 1640  LIPASE 31   No results for input(s):  "AMMONIA" in the last 168 hours. CBC: Recent Labs  Lab 07/02/22 1504 07/02/22 1529 07/03/22 0233 07/04/22 0038 07/05/22 0443 07/06/22 1125 07/07/22 0254  WBC 10.8*  --  6.5 5.7 6.2 6.5 7.2  NEUTROABS 9.2*  --   --  2.1 2.4  --  3.0  HGB 16.3*   < > 16.0* 13.0 13.3 13.2 13.3  HCT 46.9*   < > 46.6* 38.6 38.7 39.0 37.7  MCV 91.8  --  91.9 93.0 93.3 92.4 90.2  PLT 273  --  188 186 188 192 202   < > = values in this interval not displayed.   Cardiac Enzymes: No results for input(s): "CKTOTAL", "CKMB", "CKMBINDEX", "TROPONINI" in the last 168 hours. BNP: BNP (last 3 results) No results for input(s): "BNP" in the last 8760 hours.  ProBNP (last 3 results) No results for input(s): "PROBNP" in the last 8760 hours.  CBG: Recent Labs  Lab 07/07/22 1829 07/07/22 2016 07/08/22 0837 07/08/22 1042 07/08/22 1314  GLUCAP 232* 224* 200* 199* 209*       Signed:  Ramiro Harvest MD.  [facility] 07/08/2022, 3:13 PM
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
## 2022-07-09 ENCOUNTER — Other Ambulatory Visit (HOSPITAL_COMMUNITY): Payer: Self-pay

## 2022-07-09 DIAGNOSIS — R45851 Suicidal ideations: Secondary | ICD-10-CM | POA: Diagnosis not present

## 2022-07-09 DIAGNOSIS — F331 Major depressive disorder, recurrent, moderate: Secondary | ICD-10-CM

## 2022-07-09 DIAGNOSIS — R112 Nausea with vomiting, unspecified: Secondary | ICD-10-CM | POA: Diagnosis not present

## 2022-07-09 DIAGNOSIS — F339 Major depressive disorder, recurrent, unspecified: Secondary | ICD-10-CM | POA: Diagnosis present

## 2022-07-09 DIAGNOSIS — H8110 Benign paroxysmal vertigo, unspecified ear: Secondary | ICD-10-CM | POA: Diagnosis not present

## 2022-07-09 DIAGNOSIS — E876 Hypokalemia: Secondary | ICD-10-CM | POA: Diagnosis not present

## 2022-07-09 DIAGNOSIS — Z634 Disappearance and death of family member: Secondary | ICD-10-CM

## 2022-07-09 DIAGNOSIS — E1165 Type 2 diabetes mellitus with hyperglycemia: Secondary | ICD-10-CM | POA: Diagnosis not present

## 2022-07-09 LAB — GLUCOSE, CAPILLARY
Glucose-Capillary: 231 mg/dL — ABNORMAL HIGH (ref 70–99)
Glucose-Capillary: 268 mg/dL — ABNORMAL HIGH (ref 70–99)
Glucose-Capillary: 221 mg/dL — ABNORMAL HIGH (ref 70–99)
Glucose-Capillary: 263 mg/dL — ABNORMAL HIGH (ref 70–99)

## 2022-07-09 MED ORDER — INSULIN GLARGINE-YFGN 100 UNIT/ML ~~LOC~~ SOLN
36.0000 [IU] | Freq: Two times a day (BID) | SUBCUTANEOUS | Status: DC
  Filled 2022-07-09: qty 0.36
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
## 2022-07-09 NOTE — Discharge Summary (Signed)
Physician Discharge Summary  [name] UEA:[id number] DOB: Jun 03, 1970 DOA: 07/02/2022  PCP: Loyola Mast, NP  Admit date: 07/02/2022 Discharge date: 07/09/2022  Time spent: 60 minutes  Recommendations for Outpatient Follow-up:  Follow-up with Loyola Mast, NP in 2 weeks.  On follow-up patient's diabetes will need to be reassessed on follow-up.  Patient will need a basic metabolic profile, magnesium level checked to follow-up on electrolytes and renal function.  Lipoma will need to be followed up upon.  BPPV will need to be followed up upon. Follow-up with outpatient PT vestibular for BPPV. Outpatient follow-up with psychiatry, arranged by psychiatry prior to discharge.   Discharge Diagnoses:  Principal Problem:   Intractable vomiting with nausea Active Problems:   Poorly controlled type 2 diabetes mellitus (HCC)   Chronic pain   Depression with anxiety   Seizure   Hyperglycemia   Nausea vomiting and diarrhea   Hypokalemia   BPPV (benign paroxysmal positional vertigo)   Suicidal ideation   Major depressive disorder, recurrent episode   Bereavement   Discharge Condition: Stable and improved.  Diet recommendation: Carb modified diet  Filed Weights   07/02/22 1455  Weight: 116.6 kg    History of present illness:  HPI per Dr. Loren Racer is a 52 y.o. female with medical history significant of type 2 diabetes, chronic pain, hypertension, hypothyroidism, migraines, osteoarthritis, anxiety and depression, GERD who presents for persistent nausea, vomiting and diarrhea.  She ate [employer] pizza and then the next day she started having persistent nausea, vomiting and diarrhea.  States that she has had "a 100" episodes of diarrhea and about 15-20 episodes of vomiting since then.  Her boyfriend who also ate the pizza has diarrhea.  Additionally, she is having headache, back pain, bilateral knee pain.  Denies fever, rhinorrhea, nasal congestion.  Endorses cough.   In ED,  she was tachycardic and intermittently tachypneic and hypertensive.  Labs were reviewed and significant for: BHB 0.63, CBG 475, COVID, influenza and RSV negative, sodium 131, potassium 3.8, serum creatinine 1.09, AST 51, ALT 57, ALP 129, pH 7.34, pCO2 36.7, bicarb 19.9, anion gap 16, lipase 31, urine pregnancy negative despite mild elevation in beta hCG, glucosuria without acute cystitis.  She received 10 units NovoLog, Dilaudid 1 mg, morphine 4 mg, promethazine 12.5 mg, and 2 x 1 L bolus of NS.  CT abdomen pelvis showed no acute processes.  ED provider consulted hospitalist group for intractable nausea and vomiting.  Hospital Course:  #1. intractable nausea vomiting and diarrhea -Likely secondary to foodborne gastroenteritis versus early onset DKA. -Lipase levels not concerning for pancreatitis. -Patient improved clinically, diarrhea improved in terms of frequency and consistency.   -Nausea and vomiting resolved.   -Patient initially was on a clear liquid diet and diet advanced to a carb modified diet which she tolerated.   -C. difficile PCR negative.  -Patient also placed on Imodium as needed. -Patient improved clinically and will be discharged in stable and improved condition.   2.  Type 2 diabetes with hyperglycemia -Patient with noted to admitting physician unable to eat anything, patient noted to be hyperglycemic initially with elevated anion gap and mildly elevated beta hydroxybutyrate felt likely secondary to starvation ketosis versus early onset DKA. -Labs improved during the hospitalization.  -Hemoglobin A1c 10.0. -Patient adequately hydrated with IV fluids during the hospitalization. -Patient on 70/30 40 units twice daily is what is prescribed however per med rec patient taking 60 units 3 times daily, as well as Ozempic  weekly. -Patient maintained on Semglee during the hospitalization and dose adjusted for better blood glucose control patient was maintained on Semglee 28 units twice  daily in addition to NovoLog sliding scale insulin.   -Patient seen by diabetic coordinator.   -Outpatient follow-up with PCP.    3. hypothyroidism -Patient maintained on home regimen Synthroid.     4.  Depression and anxiety -Patient maintained on home regimen Zoloft, BuSpar.    5.  Seizure disorder -Continued on home regimen Keppra.     6.  Chronic pain -Patient was maintained on home regimen of fentanyl patch, gabapentin, Soma.   -Patient stated she takes oxycodone 10 mg every 4 hours as needed at home and as such oxycodone was changed to her home regimen.  -Outpatient follow-up with her pain clinic.     7.BPPV -Patient with dizziness and lightheadedness from supine to standing position.   Patient stated was on medication to help lower blood pressure in the past which was recently discontinued about a week ago by nurse practitioner at her PCPs office. -Patient may be describing a history of chronic orthostatic hypotension and likely may have been on midodrine. -Orthostatics checked was negative.  -Patient hydrated with IV fluids however still with dizziness/spinning sensation. -PT vestibular evaluation ordered, assessed by PT and concerns for BPPV. -Patient reassessed by vestibular PT 2 more times during the hospitalization and findings were consistent with BPPV and recommended outpatient vestibular PT follow-up.   -Patient placed on meclizine as needed.   -Cleared will be discharged in stable and improved condition with outpatient follow-up with PCP and outpatient PT vestibular.    8.  Hypokalemia/hypomagnesemia -Likely secondary to GI losses. -Repleted during the hospitalization. -Outpatient follow-up with PCP.   9.  Lipoma -Area of lipoma with no erythema, no warmth, no significant fluctuance noted. -Outpatient follow-up with PCP.  10.  Major depressive disorder with bereavement -Patient was to be discharged 07/08/2022 however just prior to discharge patient expressed  suicidal ideation and possible suicidal plan to chaplain and RN and as such discharge was canceled. -Psychiatry consulted who assessed the patient, patient noted to be more having a major depressive disorder with bereavement. -It was felt per psychiatry that patient did not meet involuntary commitment criteria and recommended discharge with outpatient follow-up with psychiatry. -Psychiatry did not recommend any medication changes as patient had noted to psychiatry that she was switched from Cymbalta to Zoloft. -Appointment has been made for outpatient follow-up with psychiatry. -Patient will be discharged in stable condition.      Procedures: CT abdomen pelvis 07/02/2022  Consultations: Psychiatry: Dr. [name] Dr. Gasper Sells 07/09/2022  Discharge Exam: Vitals:   07/09/22 0736 07/09/22 1551  BP: (!) 126/90 117/79  Pulse: 80 86  Resp: 18 18  Temp: 98.1 F (36.7 C) 98.2 F (36.8 C)  SpO2: 97% 92%    General: NAD Cardiovascular: RRR no murmurs rubs or gallops.  No JVD.  No lower extremity edema. Respiratory: Clear to auscultation bilaterally.  No wheezes, no crackles, no rhonchi.  Fair air movement.  Speaking in full sentences.  Discharge Instructions   Discharge Instructions     Ambulatory referral to Physical Therapy   Complete by: As directed    Needs vestibular rehab for BPPV.   Ambulatory referral to Physical Therapy   Complete by: As directed    Diet Carb Modified   Complete by: As directed    Increase activity slowly   Complete by: As directed       Allergies  as of 07/09/2022       Reactions   Penicillins Hives, Other (See Comments)   HAS PT DEVELOPED SEVERE RASH INVOLVING MUCUS MEMBRANES or SKIN NECROSIS: #  #  YES  #  # PATIENT HAS HAD A PCN REACTION THAT REQUIRED HOSPITALIZATION:  #  #  YES  #  #  Tolerates amoxicillin on multiple occasions per Dr. Jacinto Halim note 11/01/17.  TDD.   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   Aspirin Low Dose 81 MG tablet Generic drug: aspirin EC Take 81 mg by mouth daily.   atorvastatin 20 MG tablet Commonly known as: LIPITOR Take 20 mg by mouth daily.   Biotin 16109 MCG Tabs Take 10,000 mcg by mouth daily.   busPIRone 10 MG tablet Commonly known as: BUSPAR Take 10 mg by mouth 2 (two) times daily. What changed: Another medication with the same name was removed. Continue taking this medication, and follow the directions you see here.   carisoprodol 350 MG tablet Commonly known as: SOMA Take 350 mg by mouth 4 (four) times daily.   cetirizine 10 MG tablet Commonly known as: ZYRTEC Take 10 mg by mouth daily.   eletriptan 40 MG tablet Commonly known as: RELPAX Take 40 mg by mouth every 2 (two) hours as needed for headache.   fentaNYL 50 MCG/HR Commonly known as: DURAGESIC Place 1 patch onto the skin every 3 (three) days.   gabapentin 600 MG tablet Commonly known as: NEURONTIN Take 0.5 tablets (300 mg total) by mouth at bedtime. What changed:  how much to take when to take this   levETIRAcetam 750 MG tablet Commonly known as: KEPPRA Take 750 mg by mouth in the morning, at noon, and at bedtime.   levothyroxine 150 MCG tablet Commonly known as: SYNTHROID Take 150 mcg by mouth daily before breakfast.    lidocaine 5 % Commonly known as: LIDODERM Place 3 patches onto the skin See admin instructions. Place 3 patches daily to affected areas and replace every 24 hours   lisinopril 10 MG tablet Commonly known as: ZESTRIL Take 10 mg by mouth daily.   meclizine 25 MG tablet Commonly known as: ANTIVERT Take 1 tablet (25 mg total) by mouth 3 (three) times daily as needed for dizziness.   NexIUM 40 MG capsule Generic drug: esomeprazole Take 40 mg by mouth daily.   NovoLOG Mix 70/30 FlexPen (70-30) 100 UNIT/ML FlexPen Generic drug: insulin aspart protamine - aspart Inject 40 Units into the skin 2 (two) times daily with a meal. What changed:  how much to take when to take this   omega-3 acid ethyl esters 1 g capsule Commonly known as: LOVAZA Take 1 capsule  by mouth 2 (two) times daily.   Oxycodone HCl 10 MG Tabs Take 1 tablet (10 mg total) by mouth every 6 (six) hours as needed (for pain). What changed: when to take this   Ozempic (1 MG/DOSE) 4 MG/3ML Sopn Generic drug: Semaglutide (1 MG/DOSE) Inject 1 mg into the skin once a week. mondays   promethazine 25 MG tablet Commonly known as: PHENERGAN Take 25 mg by mouth every 8 (eight) hours as needed for nausea or vomiting.   sertraline 50 MG tablet Commonly known as: ZOLOFT Take 50 mg by mouth at bedtime.   triamcinolone cream 0.1 % Commonly known as: KENALOG Apply 1 application topically 2 (two) times daily as needed (skin rash).   Vitamin D3 50 MCG (2000 UT) Tabs Take 1 tablet by mouth daily.   Vitamin Deficiency System-B12 1000 MCG/ML Kit Generic drug: Cyanocobalamin Inject 1,000 mcg as directed every 30 (thirty) days.   WesTab Plus 27-1 MG Tabs Take 1 tablet by mouth daily.       Allergies  Allergen Reactions   Penicillins Hives and Other (See Comments)    HAS PT DEVELOPED SEVERE RASH INVOLVING MUCUS MEMBRANES or SKIN NECROSIS: #  #  YES  #  # PATIENT HAS HAD A PCN REACTION THAT REQUIRED HOSPITALIZATION:  #  #   YES  #  #   Tolerates amoxicillin on multiple occasions per Dr. Jacinto Halim note 11/01/17.  TDD.   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 Schedule an appointment as soon as possible for a visit.   Specialty: Rehabilitation Why: Call to schedule apt for physical therapy Contact information: [address] [facility] [id number] [place] [address] [phone]        Loyola Mast, NP. Schedule an appointment as soon as possible for a visit in 2 week(s).   Specialty: Family Medicine Contact information: [address] [place] [address] [phone]                  The results of significant diagnostics from this hospitalization (including imaging, microbiology, ancillary and laboratory) are listed below for reference.    Significant Diagnostic Studies: CT ABDOMEN PELVIS W CONTRAST  Result Date: 07/02/2022 CLINICAL DATA:  Vomiting and abdominal pain. EXAM: CT ABDOMEN AND PELVIS WITH CONTRAST TECHNIQUE: Multidetector CT imaging of the abdomen and pelvis was performed using the standard protocol following bolus administration of intravenous contrast. RADIATION DOSE REDUCTION: This exam was performed according to the departmental dose-optimization program which includes automated exposure control, adjustment of the mA and/or kV according to patient size and/or use of iterative reconstruction technique. CONTRAST:  75mL OMNIPAQUE IOHEXOL 350 MG/ML SOLN COMPARISON:  May 14, 2021 FINDINGS: Lower chest: Mild atelectasis is seen within the posterior aspect of the  right lung base. Hepatobiliary: No focal liver abnormality is seen. Status post cholecystectomy. No biliary dilatation. Pancreas: Unremarkable. No pancreatic ductal dilatation or surrounding inflammatory changes. Spleen: Normal in size without focal abnormality. Adrenals/Urinary Tract: Adrenal glands are unremarkable. Kidneys are normal in size, without renal  calculi or hydronephrosis. A 1.3 cm simple cyst is seen within the posterior aspect of the upper pole of the left kidney. Bladder is unremarkable. Stomach/Bowel: Stomach is within normal limits. Appendix appears normal. No evidence of bowel wall thickening, distention, or inflammatory changes. Vascular/Lymphatic: No significant vascular findings are present. Stable right upper quadrant and mid line upper abdomen lymphadenopathy is noted. Reproductive: A properly positioned IUD is seen within an otherwise normal appearing uterus. The bilateral adnexa are unremarkable. Other: No abdominal wall hernia or abnormality. No abdominopelvic ascites. Musculoskeletal: A stable 7.8 cm x 3.2 cm, likely benign mixed attenuation, partially calcified mass is seen within the subcutaneous soft tissues of the posterior pelvic wall, to the left of midline. No acute osseous abnormalities are identified. IMPRESSION: 1. No acute or active process within the abdomen or pelvis. 2. Evidence of prior cholecystectomy. 3. Small simple cyst within the left kidney. No follow-up imaging is recommended. This recommendation follows ACR consensus guidelines: Management of the Incidental Renal Mass on CT: A White Paper of the ACR Incidental Findings Committee. J Am Coll Radiol [phone]. Electronically Signed   By: Aram Candela M.D.   On: 07/02/2022 21:15    Microbiology: Recent Results (from the past 240 hour(s))  Resp panel by RT-PCR (RSV, Flu A&B, Covid) Anterior Nasal Swab     Status: None   Collection Time: 07/02/22  4:40 PM   Specimen: Anterior Nasal Swab  Result Value Ref  Range Status   SARS Coronavirus 2 by RT PCR NEGATIVE NEGATIVE Final   Influenza A by PCR NEGATIVE NEGATIVE Final   Influenza B by PCR NEGATIVE NEGATIVE Final    Comment: (NOTE) The Xpert Xpress SARS-CoV-2/FLU/RSV plus assay is intended as an aid in the diagnosis of influenza from Nasopharyngeal swab specimens and should not be used as a sole basis for treatment. Nasal washings and aspirates are unacceptable for Xpert Xpress SARS-CoV-2/FLU/RSV testing.  Fact Sheet for Patients: BloggerCourse.com  Fact Sheet for Healthcare Providers: SeriousBroker.it  This test is not yet approved or cleared by the [place] FDA and has been authorized for detection and/or diagnosis of SARS-CoV-2 by FDA under an Emergency Use Authorization (EUA). This EUA will remain in effect (meaning this test can be used) for the duration of the COVID-19 declaration under Section 564(b)(1) of the Act, 21 U.S.C. section 360bbb-3(b)(1), unless the authorization is terminated or revoked.     Resp Syncytial Virus by PCR NEGATIVE NEGATIVE Final    Comment: (NOTE) Fact Sheet for Patients: BloggerCourse.com  Fact Sheet for Healthcare Providers: SeriousBroker.it  This test is not yet approved or cleared by the [place] FDA and has been authorized for detection and/or diagnosis of SARS-CoV-2 by FDA under an Emergency Use Authorization (EUA). This EUA will remain in effect (meaning this test can be used) for the duration of the COVID-19 declaration under Section 564(b)(1) of the Act, 21 U.S.C. section 360bbb-3(b)(1), unless the authorization is terminated or revoked.  Performed at [facility], [address] [place], [place] [address]   C Difficile Quick Screen w PCR reflex     Status: None   Collection Time: 07/03/22  4:40 PM   Specimen: STOOL  Result Value Ref Range Status   C Diff antigen  NEGATIVE NEGATIVE Final   C Diff toxin NEGATIVE NEGATIVE Final   C Diff interpretation No C. difficile detected.  Final    Comment: Performed at [facility], [address], [place], [place] [address]  Labs: Basic Metabolic Panel: Recent Labs  Lab 07/04/22 0038 07/05/22 0443 07/06/22 1125 07/07/22 0254 07/08/22 0329  NA 135 134* 138 135 135  K 3.3* 3.6 3.4* 3.7 3.7  CL 108 108 105 107 101  CO2 20* 20* 21* 23 25  GLUCOSE 179* 151* 195* 192* 237*  BUN 16 7 9 7 9   CREATININE 0.76 0.74 0.76 0.76 0.85  CALCIUM 8.3* 8.3* 8.7* 8.7* 9.1  MG 1.4* 2.0  --  1.3* 1.8   Liver Function Tests: Recent Labs  Lab 07/03/22 0233  AST 32  ALT 45*  ALKPHOS 104  BILITOT 0.7  PROT 6.6  ALBUMIN 3.4*   No results for input(s): "LIPASE", "AMYLASE" in the last 168 hours.  No results for input(s): "AMMONIA" in the last 168 hours. CBC: Recent Labs  Lab 07/03/22 0233 07/04/22 0038 07/05/22 0443 07/06/22 1125 07/07/22 0254  WBC 6.5 5.7 6.2 6.5 7.2  NEUTROABS  --  2.1 2.4  --  3.0  HGB 16.0* 13.0 13.3 13.2 13.3  HCT 46.6* 38.6 38.7 39.0 37.7  MCV 91.9 93.0 93.3 92.4 90.2  PLT 188 186 188 192 202   Cardiac Enzymes: No results for input(s): "CKTOTAL", "CKMB", "CKMBINDEX", "TROPONINI" in the last 168 hours. BNP: BNP (last 3 results) No results for input(s): "BNP" in the last 8760 hours.  ProBNP (last 3 results) No results for input(s): "PROBNP" in the last 8760 hours.  CBG: Recent Labs  Lab 07/08/22 1958 07/09/22 0221 07/09/22 0733 07/09/22 1133 07/09/22 1548  GLUCAP 288* 231* 268* 221* 263*       Signed:  Ramiro Harvest MD.  [facility] 07/09/2022, 4:46 PM
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
## 2022-07-10 ENCOUNTER — Other Ambulatory Visit (HOSPITAL_COMMUNITY): Payer: Medicare PPO | Attending: Psychiatry

## 2022-07-10 ENCOUNTER — Telehealth (HOSPITAL_COMMUNITY): Payer: Self-pay | Admitting: Professional

## 2022-07-12 ENCOUNTER — Other Ambulatory Visit (HOSPITAL_COMMUNITY): Payer: Self-pay

## 2022-08-22 ENCOUNTER — Encounter: Payer: Self-pay | Admitting: Physician Assistant

## 2022-08-22 ENCOUNTER — Other Ambulatory Visit: Payer: Self-pay | Admitting: Adult Health Nurse Practitioner

## 2022-08-22 DIAGNOSIS — Z1231 Encounter for screening mammogram for malignant neoplasm of breast: Secondary | ICD-10-CM

## 2022-08-26 ENCOUNTER — Ambulatory Visit: Payer: Medicare PPO | Admitting: Orthopaedic Surgery

## 2022-09-19 ENCOUNTER — Ambulatory Visit: Payer: Medicare PPO

## 2022-10-02 ENCOUNTER — Ambulatory Visit: Payer: Medicare PPO | Admitting: Orthopaedic Surgery

## 2022-11-21 ENCOUNTER — Ambulatory Visit: Payer: Medicare PPO

## 2023-01-02 ENCOUNTER — Ambulatory Visit: Payer: Medicare PPO

## 2023-02-04 ENCOUNTER — Ambulatory Visit: Payer: Medicare PPO

## 2023-02-24 ENCOUNTER — Ambulatory Visit (INDEPENDENT_AMBULATORY_CARE_PROVIDER_SITE_OTHER): Payer: Medicare PPO | Admitting: Family Medicine

## 2023-02-24 ENCOUNTER — Encounter: Payer: Self-pay | Admitting: Family Medicine

## 2023-02-24 VITALS — BP 139/87 | HR 85 | Ht 65.0 in | Wt 266.2 lb

## 2023-02-24 DIAGNOSIS — E039 Hypothyroidism, unspecified: Secondary | ICD-10-CM

## 2023-02-24 DIAGNOSIS — Z1211 Encounter for screening for malignant neoplasm of colon: Secondary | ICD-10-CM

## 2023-02-24 DIAGNOSIS — E538 Deficiency of other specified B group vitamins: Secondary | ICD-10-CM | POA: Insufficient documentation

## 2023-02-24 DIAGNOSIS — Z794 Long term (current) use of insulin: Secondary | ICD-10-CM | POA: Diagnosis not present

## 2023-02-24 DIAGNOSIS — I10 Essential (primary) hypertension: Secondary | ICD-10-CM | POA: Insufficient documentation

## 2023-02-24 DIAGNOSIS — E782 Mixed hyperlipidemia: Secondary | ICD-10-CM | POA: Diagnosis not present

## 2023-02-24 DIAGNOSIS — E559 Vitamin D deficiency, unspecified: Secondary | ICD-10-CM | POA: Insufficient documentation

## 2023-02-24 DIAGNOSIS — E1165 Type 2 diabetes mellitus with hyperglycemia: Secondary | ICD-10-CM

## 2023-02-24 DIAGNOSIS — F411 Generalized anxiety disorder: Secondary | ICD-10-CM | POA: Insufficient documentation

## 2023-02-24 DIAGNOSIS — F321 Major depressive disorder, single episode, moderate: Secondary | ICD-10-CM | POA: Insufficient documentation

## 2023-02-24 MED ORDER — SEMAGLUTIDE (2 MG/DOSE) 8 MG/3ML ~~LOC~~ SOPN: 2.0000 mg | PEN_INJECTOR | SUBCUTANEOUS | 3 refills | Status: DC

## 2023-02-24 MED ORDER — NOVOLOG MIX 70/30 FLEXPEN (70-30) 100 UNIT/ML ~~LOC~~ SUPN: 60.0000 [IU] | PEN_INJECTOR | Freq: Two times a day (BID) | SUBCUTANEOUS | 5 refills | Status: DC

## 2023-02-24 NOTE — Progress Notes (Signed)
New patient visit   Patient: [name]   DOB: [birth year]   52 y.o. Female  MRN: [id number] Visit Date: 02/24/2023  Today's healthcare provider: Charlton Amor, DO   Chief Complaint  Patient presents with   New Patient (Initial Visit)    Establish Care    SUBJECTIVE    Chief Complaint  Patient presents with   New Patient (Initial Visit)    Establish Care   HPI HPI     New Patient (Initial Visit)    Additional comments: Establish Care      Last edited by Roselyn Reef, CMA on 02/24/2023  3:18 PM.      Pt presents to establish care.   Pmh significant for  DM  - last A1c 9.8 in August  - was on ozempic and has missed some dosings  - on novolog 70/30 and taking 60u TID   Hyperlipidemia - on atorvastatin  Hypothyroidism  - on synthroid   HTN - on lisinopril   MDD/GAD - zoloft 50mg   - doing well on this  - on buspar at night   Review of Systems  Constitutional:  Negative for activity change, fatigue and fever.  Respiratory:  Negative for cough and shortness of breath.   Cardiovascular:  Negative for chest pain.  Gastrointestinal:  Negative for abdominal pain.  Genitourinary:  Negative for difficulty urinating.       Current Meds  Medication Sig   acetaminophen (TYLENOL) 500 MG tablet Take 500 mg by mouth every 6 (six) hours as needed for moderate pain.   ASPIRIN LOW DOSE 81 MG EC tablet Take 81 mg by mouth daily.   atorvastatin (LIPITOR) 20 MG tablet Take 20 mg by mouth daily.   Biotin 13086 MCG TABS Take 10,000 mcg by mouth daily.   busPIRone (BUSPAR) 10 MG tablet Take 10 mg by mouth 2 (two) times daily.   carisoprodol (SOMA) 350 MG tablet Take 350 mg by mouth 4 (four) times daily.   cetirizine (ZYRTEC) 10 MG tablet Take 10 mg by mouth daily.   Cholecalciferol (VITAMIN D3) 50 MCG (2000 UT) TABS Take 1 tablet by mouth daily.   Cyanocobalamin (VITAMIN DEFICIENCY SYSTEM-B12) 1000 MCG/ML KIT Inject 1,000 mcg as directed every 30 (thirty)  days.   eletriptan (RELPAX) 40 MG tablet Take 40 mg by mouth every 2 (two) hours as needed for headache.   fentaNYL (DURAGESIC) 50 MCG/HR Place 1 patch onto the skin every 3 (three) days.   gabapentin (NEURONTIN) 600 MG tablet Take 0.5 tablets (300 mg total) by mouth at bedtime. (Patient taking differently: Take 600 mg by mouth 3 (three) times daily.)   levETIRAcetam (KEPPRA) 750 MG tablet Take 750 mg by mouth in the morning, at noon, and at bedtime.   levothyroxine (SYNTHROID) 150 MCG tablet Take 150 mcg by mouth daily before breakfast.   lidocaine (LIDODERM) 5 % Place 3 patches onto the skin See admin instructions. Place 3 patches daily to affected areas and replace every 24 hours   lisinopril (ZESTRIL) 10 MG tablet Take 10 mg by mouth daily.   meclizine (ANTIVERT) 25 MG tablet Take 1 tablet (25 mg total) by mouth 3 (three) times daily as needed for dizziness.   NEXIUM 40 MG capsule Take 40 mg by mouth daily.   omega-3 acid ethyl esters (LOVAZA) 1 g capsule Take 1 capsule by mouth 2 (two) times daily.   Oxycodone HCl 10 MG TABS Take 1 tablet (10 mg total) by mouth  every 6 (six) hours as needed (for pain). (Patient taking differently: Take 10 mg by mouth every 4 (four) hours as needed (for pain).)   Prenatal Vit-Fe Fumarate-FA (WESTAB PLUS) 27-1 MG TABS Take 1 tablet by mouth daily.   promethazine (PHENERGAN) 25 MG tablet Take 25 mg by mouth every 8 (eight) hours as needed for nausea or vomiting.   Semaglutide, 2 MG/DOSE, 8 MG/3ML SOPN Inject 2 mg as directed once a week.   sertraline (ZOLOFT) 50 MG tablet Take 50 mg by mouth at bedtime.   triamcinolone cream (KENALOG) 0.1 % Apply 1 application topically 2 (two) times daily as needed (skin rash).   [DISCONTINUED] Semaglutide, 1 MG/DOSE, (OZEMPIC, 1 MG/DOSE,) 4 MG/3ML SOPN Inject 1 mg into the skin once a week. mondays    OBJECTIVE    BP 139/87 (BP Location: Left Arm, Patient Position: Sitting, Cuff Size: Large)   Pulse 85   Ht 5\' 5"  (1.651  m)   Wt 266 lb 4 oz (120.8 kg)   SpO2 95%   BMI 44.31 kg/m   Physical Exam Vitals and nursing note reviewed.  Constitutional:      General: She is not in acute distress.    Appearance: Normal appearance.  HENT:     Head: Normocephalic and atraumatic.     Right Ear: External ear normal.     Left Ear: External ear normal.     Nose: Nose normal.  Eyes:     Conjunctiva/sclera: Conjunctivae normal.  Cardiovascular:     Rate and Rhythm: Normal rate and regular rhythm.  Pulmonary:     Effort: Pulmonary effort is normal.     Breath sounds: Normal breath sounds.  Neurological:     General: No focal deficit present.     Mental Status: She is alert and oriented to person, place, and time.  Psychiatric:        Mood and Affect: Mood normal.        Behavior: Behavior normal.        Thought Content: Thought content normal.        Judgment: Judgment normal.        ASSESSMENT & PLAN    Problem List Items Addressed This Visit       Cardiovascular and Mediastinum   Primary hypertension    - lisinopril 10mg   - will get blood work to evaluate kidney function      Relevant Orders   CMP14+EGFR     Endocrine   Hypothyroidism (Chronic)    Will check TSH to make sure synthroid dosage is adequate. Pt does admit to thyroid symptoms      Relevant Orders   TSH   Poorly controlled type 2 diabetes mellitus (HCC) - Primary    A1c has increased to 11.7% on poc testing from 9.8 she has missed some dosages of ozempic. Has been out of insulin for the last three days - will go ahead and refill insulin 70/30 at 60u BID and ozempic 2mg  weekly  - follow up in one month as I anticipate [name] needing to change medication regiment as well and add on farxiga  - referral sent to ophth to evaluate for diabetic retinopathy and micro/alb ordered      Relevant Medications   insulin aspart protamine - aspart (NOVOLOG MIX 70/30 FLEXPEN) (70-30) 100 UNIT/ML FlexPen   Semaglutide, 2 MG/DOSE, 8 MG/3ML SOPN    Other Relevant Orders   Microalbumin / creatinine urine ratio   CMP14+EGFR   Lipid panel  CBC   Ambulatory referral to Ophthalmology     Other   Mixed hyperlipidemia    On atorvastatin, ordered lipid panel      Vitamin D deficiency   Relevant Orders   Vitamin D (25 hydroxy)   Vitamin B12 deficiency   Relevant Orders   Vitamin B12   Current moderate episode of major depressive disorder without prior episode (HCC)   GAD (generalized anxiety disorder)   Other Visit Diagnoses     Screening for colon cancer       Relevant Orders   Cologuard       Return in about 4 weeks (around 03/24/2023) for Diabetes check.      Meds ordered this encounter  Medications   insulin aspart protamine - aspart (NOVOLOG MIX 70/30 FLEXPEN) (70-30) 100 UNIT/ML FlexPen    Sig: Inject 60 Units into the skin 2 (two) times daily with a meal.    Dispense:  54 mL    Refill:  5   Semaglutide, 2 MG/DOSE, 8 MG/3ML SOPN    Sig: Inject 2 mg as directed once a week.    Dispense:  3 mL    Refill:  3    Orders Placed This Encounter  Procedures   Microalbumin / creatinine urine ratio   CMP14+EGFR    Order Specific Question:   Has the patient fasted?    Answer:   No   Lipid panel    Order Specific Question:   Has the patient fasted?    Answer:   No    Order Specific Question:   Release to patient    Answer:   Immediate   CBC   TSH   Vitamin D (25 hydroxy)   Vitamin B12   Cologuard   Ambulatory referral to Ophthalmology    Referral Priority:   Routine    Referral Type:   Consultation    Referral Reason:   Specialty Services Required    Requested Specialty:   Ophthalmology    Number of Visits Requested:   1     Charlton Amor, DO  [facility] at [facility] [phone] (phone) [phone] (fax)  [facility]

## 2023-02-24 NOTE — Assessment & Plan Note (Signed)
On atorvastatin, ordered lipid panel

## 2023-02-24 NOTE — Assessment & Plan Note (Signed)
-   lisinopril 10mg   - will get blood work to evaluate kidney function

## 2023-02-24 NOTE — Assessment & Plan Note (Signed)
Will check TSH to make sure synthroid dosage is adequate. Pt does admit to thyroid symptoms

## 2023-02-24 NOTE — Assessment & Plan Note (Signed)
A1c has increased to 11.7% on poc testing from 9.8 she has missed some dosages of ozempic. Has been out of insulin for the last three days - will go ahead and refill insulin 70/30 at 60u BID and ozempic 2mg  weekly  - follow up in one month as I anticipate [name] needing to change medication regiment as well and add on farxiga  - referral sent to ophth to evaluate for diabetic retinopathy and micro/alb ordered

## 2023-02-26 ENCOUNTER — Telehealth: Payer: Self-pay

## 2023-02-26 DIAGNOSIS — E1165 Type 2 diabetes mellitus with hyperglycemia: Secondary | ICD-10-CM

## 2023-02-26 LAB — CBC
Hematocrit: 42.8 % (ref 34.0–46.6)
Hemoglobin: 14.1 g/dL (ref 11.1–15.9)
MCH: 31.3 pg (ref 26.6–33.0)
MCHC: 32.9 g/dL (ref 31.5–35.7)
MCV: 95 fL (ref 79–97)
Platelets: 246 10*3/uL (ref 150–450)
RBC: 4.5 x10E6/uL (ref 3.77–5.28)
RDW: 12.2 % (ref 11.7–15.4)
WBC: 9.4 10*3/uL (ref 3.4–10.8)

## 2023-02-26 LAB — MICROALBUMIN / CREATININE URINE RATIO
Creatinine, Urine: 196.6 mg/dL
Microalb/Creat Ratio: 14 mg/g{creat} (ref 0–29)
Microalbumin, Urine: 28.5 ug/mL

## 2023-02-26 LAB — CMP14+EGFR
ALT: 33 [IU]/L — ABNORMAL HIGH (ref 0–32)
AST: 26 [IU]/L (ref 0–40)
Albumin: 4.1 g/dL (ref 3.8–4.9)
Alkaline Phosphatase: 140 [IU]/L — ABNORMAL HIGH (ref 44–121)
BUN/Creatinine Ratio: 22 (ref 9–23)
BUN: 21 mg/dL (ref 6–24)
Bilirubin Total: 0.2 mg/dL (ref 0.0–1.2)
CO2: 18 mmol/L — ABNORMAL LOW (ref 20–29)
Calcium: 9.4 mg/dL (ref 8.7–10.2)
Chloride: 101 mmol/L (ref 96–106)
Creatinine, Ser: 0.96 mg/dL (ref 0.57–1.00)
Globulin, Total: 2.4 g/dL (ref 1.5–4.5)
Glucose: 360 mg/dL — ABNORMAL HIGH (ref 70–99)
Potassium: 4.5 mmol/L (ref 3.5–5.2)
Sodium: 135 mmol/L (ref 134–144)
Total Protein: 6.5 g/dL (ref 6.0–8.5)
eGFR: 70 mL/min/{1.73_m2} (ref >59)

## 2023-02-26 LAB — LIPID PANEL
Chol/HDL Ratio: 3.5 {ratio} (ref 0.0–4.4)
Cholesterol, Total: 150 mg/dL (ref 100–199)
HDL: 43 mg/dL (ref >39)
LDL Chol Calc (NIH): 57 mg/dL (ref 0–99)
Triglycerides: 327 mg/dL — ABNORMAL HIGH (ref 0–149)
VLDL Cholesterol Cal: 50 mg/dL — ABNORMAL HIGH (ref 5–40)

## 2023-02-26 LAB — TSH: TSH: 1.3 u[IU]/mL (ref 0.450–4.500)

## 2023-02-26 LAB — VITAMIN D 25 HYDROXY (VIT D DEFICIENCY, FRACTURES): Vit D, 25-Hydroxy: 20.1 ng/mL — ABNORMAL LOW (ref 30.0–100.0)

## 2023-02-26 LAB — VITAMIN B12: Vitamin B-12: 919 pg/mL (ref 232–1245)

## 2023-02-26 MED ORDER — NOVOLOG MIX 70/30 FLEXPEN (70-30) 100 UNIT/ML ~~LOC~~ SUPN: 60.0000 [IU] | PEN_INJECTOR | Freq: Two times a day (BID) | SUBCUTANEOUS | 5 refills | Status: DC

## 2023-02-26 NOTE — Telephone Encounter (Signed)
Copied from CRM [id number]. Topic: Clinical - Prescription Issue >> Feb 26, 2023 10:53 AM Fonda Kinder J wrote: Reason for CRM: Patient's rx refill for insulin aspart protamine was sent to wrong pharmacy. Patient would like for a new prescription to be sent over to [facility] [address] [place], [place], [place] [address] Patient is completely out of medication and hasn't had insulin in 2 days

## 2023-02-27 ENCOUNTER — Ambulatory Visit: Payer: Medicare PPO

## 2023-02-27 ENCOUNTER — Other Ambulatory Visit: Payer: Self-pay | Admitting: Family Medicine

## 2023-02-27 ENCOUNTER — Telehealth: Payer: Self-pay

## 2023-02-27 DIAGNOSIS — E1165 Type 2 diabetes mellitus with hyperglycemia: Secondary | ICD-10-CM

## 2023-02-27 MED ORDER — BUPROPION HCL ER (XL) 150 MG PO TB24: 150.0000 mg | ORAL_TABLET | Freq: Every day | ORAL | 2 refills | Status: DC

## 2023-02-27 MED ORDER — NOVOLOG MIX 70/30 FLEXPEN (70-30) 100 UNIT/ML ~~LOC~~ SUPN: 60.0000 [IU] | PEN_INJECTOR | Freq: Three times a day (TID) | SUBCUTANEOUS | 5 refills | Status: AC

## 2023-02-27 MED ORDER — CHOLECALCIFEROL 20 MCG (800 UNIT) PO TABS: 1.0000 | ORAL_TABLET | Freq: Every day | ORAL | 2 refills | Status: AC

## 2023-02-27 NOTE — Telephone Encounter (Signed)
E2C2 Agent called stating that patient's insulin aspart protamine should be taken 3 times a day with meal, and not 2 times a day as listed under medications, Please advise, thanks.

## 2023-02-27 NOTE — Telephone Encounter (Signed)
Copied from CRM [id number]. Topic: Clinical - Prescription Issue >> Feb 26, 2023  4:21 PM Fuller Mandril wrote: Reason for CRM: [name] from [facility] called regarding insulin aspart protamine - aspart (NOVOLOG MIX 70/30 FLEXPEN) (70-30) 100 UNIT/ML FlexPen. States pt says dosage is incorrect and she takes 5o 3x per day but Rx was written for 60 2x per day. Pharmacy wanted to verify. Callback # [phone]

## 2023-02-27 NOTE — Telephone Encounter (Signed)
See MyChart message. Correct prescription has been sent.

## 2023-03-12 ENCOUNTER — Other Ambulatory Visit: Payer: Self-pay | Admitting: Family Medicine

## 2023-03-14 ENCOUNTER — Ambulatory Visit (INDEPENDENT_AMBULATORY_CARE_PROVIDER_SITE_OTHER): Payer: Medicare PPO | Admitting: Family Medicine

## 2023-03-14 ENCOUNTER — Ambulatory Visit: Payer: Medicare PPO

## 2023-03-14 VITALS — BP 102/69 | HR 99 | Ht 65.0 in | Wt 266.2 lb

## 2023-03-14 DIAGNOSIS — M7918 Myalgia, other site: Secondary | ICD-10-CM | POA: Insufficient documentation

## 2023-03-14 DIAGNOSIS — G6289 Other specified polyneuropathies: Secondary | ICD-10-CM | POA: Diagnosis not present

## 2023-03-14 DIAGNOSIS — M791 Myalgia, unspecified site: Secondary | ICD-10-CM | POA: Insufficient documentation

## 2023-03-14 DIAGNOSIS — M25511 Pain in right shoulder: Secondary | ICD-10-CM

## 2023-03-14 DIAGNOSIS — M898X1 Other specified disorders of bone, shoulder: Secondary | ICD-10-CM | POA: Insufficient documentation

## 2023-03-14 DIAGNOSIS — G8929 Other chronic pain: Secondary | ICD-10-CM

## 2023-03-14 DIAGNOSIS — G629 Polyneuropathy, unspecified: Secondary | ICD-10-CM | POA: Insufficient documentation

## 2023-03-14 LAB — POCT GLYCOSYLATED HEMOGLOBIN (HGB A1C): Hemoglobin A1C: 11.7 % — AB (ref 4.0–5.6)

## 2023-03-14 MED ORDER — METHOCARBAMOL 500 MG PO TABS: 500.0000 mg | ORAL_TABLET | Freq: Two times a day (BID) | ORAL | 0 refills | Status: DC | PRN

## 2023-03-14 MED ORDER — PREDNISONE 50 MG PO TABS: 50.0000 mg | ORAL_TABLET | Freq: Every day | ORAL | 0 refills | Status: AC

## 2023-03-14 NOTE — Assessment & Plan Note (Signed)
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 Differential also includes muscle spasm-will go ahead and give robaxin to see if this helps. I do suspect [name] needing to get her to follow up with orthopedics about her shoulder given her additional history of trauma secondary to work

## 2023-03-14 NOTE — Progress Notes (Unsigned)
Acute Office Visit  Subjective:     Patient ID: [name], female    DOB: 13-Nov-1970, 52 y.o.   MRN: [id number]  Chief Complaint  Patient presents with   Medical Management of Chronic Issues    DM last A1C 9.8    HPI Patient is in today for acute visit for shoulder pain where she feels like she is unable to lift her arm. She also notes pain on the bottom of her feet as well as body aches. Pt does have a history of T2DM with A1c done in December was 11.7%.    Review of Systems  Constitutional:  Negative for chills and fever.  Respiratory:  Negative for cough and shortness of breath.   Cardiovascular:  Negative for chest pain.  Musculoskeletal:        R shoulder pain  Neurological:  Negative for headaches.        Objective:    BP 102/69 (BP Location: Left Arm, Patient Position: Sitting, Cuff Size: Large)   Pulse 99   Ht 5\' 5"  (1.651 m)   Wt 266 lb 4 oz (120.8 kg)   SpO2 97%   BMI 44.31 kg/m    Physical Exam Vitals and nursing note reviewed.  Constitutional:      General: She is not in acute distress.    Appearance: Normal appearance.  HENT:     Head: Normocephalic and atraumatic.     Right Ear: External ear normal.     Left Ear: External ear normal.     Nose: Nose normal.  Eyes:     Conjunctiva/sclera: Conjunctivae normal.  Cardiovascular:     Rate and Rhythm: Normal rate and regular rhythm.  Pulmonary:     Effort: Pulmonary effort is normal.     Breath sounds: Normal breath sounds.  Musculoskeletal:     Comments: Tenderness to palpation of R clavicle   Neurological:     General: No focal deficit present.     Mental Status: She is alert and oriented to person, place, and time.  Psychiatric:        Mood and Affect: Mood normal.        Behavior: Behavior normal.        Thought Content: Thought content normal.        Judgment: Judgment normal.     No results found for any visits on 03/14/23.      Assessment & Plan:   Problem List Items  Addressed This Visit       Nervous and Auditory   Peripheral neuropathy   Pt notes some pain and describes it as nail stabbing pain on the bottom of her feet. Believe this could be related to uncontrolled diabetes. She is currently on gabapentin.      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 Differential also includes muscle spasm-will go ahead and give robaxin to see if this helps. I do suspect [name] needing to get her to follow up with orthopedics about her shoulder given her additional history of trauma secondary to work      Relevant Medications   methocarbamol (ROBAXIN) 500 MG tablet   predniSONE (DELTASONE) 50 MG tablet   Myalgia - Primary   Pt notes multiple muscle pain. I have gone ahead  and ordered rheumatologic panel. I am wondering if this is an autoimmune panel she is having. Blood work ordered and patient given prednisone to see if this helps relieve some pain.      Relevant Orders   Sed Rate (ESR)   C-reactive protein   CBC with Differential   ANA,IFA RA Diag Pnl w/rflx Tit/Patn   Clavicle pain   Tenderness to clavicle on R, will go ahead and get xrays       Relevant Orders   DG Clavicle Right   Buttock pain   Pt also notes some nodules on her buttock that are more prominent with laying flat. She does admit to an increase in pain       Meds ordered this encounter  Medications   methocarbamol (ROBAXIN) 500 MG tablet    Sig: Take 1 tablet (500 mg total) by mouth 2 (two) times daily as needed for muscle spasms.    Dispense:  90 tablet    Refill:  0   predniSONE (DELTASONE) 50 MG tablet    Sig: Take 1 tablet (50 mg total) by mouth daily with breakfast for 5 days.    Dispense:  5 tablet    Refill:  0    No follow-ups on file.  Charlton Amor, DO

## 2023-03-14 NOTE — Assessment & Plan Note (Signed)
Pt notes multiple muscle pain. I have gone ahead and ordered rheumatologic panel. I am wondering if this is an autoimmune panel she is having. Blood work ordered and patient given prednisone to see if this helps relieve some pain.

## 2023-03-14 NOTE — Assessment & Plan Note (Signed)
Pt notes some pain and describes it as nail stabbing pain on the bottom of her feet. Believe this could be related to uncontrolled diabetes. She is currently on gabapentin.

## 2023-03-14 NOTE — Assessment & Plan Note (Signed)
Pt also notes some nodules on her buttock that are more prominent with laying flat. She does admit to an increase in pain

## 2023-03-14 NOTE — Assessment & Plan Note (Signed)
Tenderness to clavicle on R, will go ahead and get xrays

## 2023-03-14 NOTE — Addendum Note (Signed)
Addended byRoselyn Reef on: 03/14/2023 11:53 AM   Modules accepted: Orders

## 2023-03-17 ENCOUNTER — Encounter: Payer: Self-pay | Admitting: Family Medicine

## 2023-03-17 ENCOUNTER — Telehealth: Payer: Self-pay | Admitting: Family Medicine

## 2023-03-17 NOTE — Telephone Encounter (Signed)
Attempted call to patient - mail box full - could not leave a voicemail message.

## 2023-03-17 NOTE — Telephone Encounter (Signed)
Pt called. One of her levels is really high and she wants to talk to someone.

## 2023-03-19 LAB — CBC WITH DIFFERENTIAL/PLATELET
Basophils Absolute: 0 10*3/uL (ref 0.0–0.2)
Basos: 0 %
EOS (ABSOLUTE): 0.1 10*3/uL (ref 0.0–0.4)
Eos: 1 %
Hematocrit: 38.5 % (ref 34.0–46.6)
Hemoglobin: 12.7 g/dL (ref 11.1–15.9)
Immature Grans (Abs): 0 10*3/uL (ref 0.0–0.1)
Immature Granulocytes: 0 %
Lymphocytes Absolute: 2.5 10*3/uL (ref 0.7–3.1)
Lymphs: 25 %
MCH: 31.2 pg (ref 26.6–33.0)
MCHC: 33 g/dL (ref 31.5–35.7)
MCV: 95 fL (ref 79–97)
Monocytes Absolute: 0.8 10*3/uL (ref 0.1–0.9)
Monocytes: 8 %
Neutrophils Absolute: 6.4 10*3/uL (ref 1.4–7.0)
Neutrophils: 66 %
Platelets: 224 10*3/uL (ref 150–450)
RBC: 4.07 x10E6/uL (ref 3.77–5.28)
RDW: 12 % (ref 11.7–15.4)
WBC: 9.8 10*3/uL (ref 3.4–10.8)

## 2023-03-19 LAB — SEDIMENTATION RATE: Sed Rate: 39 mm/h (ref 0–40)

## 2023-03-19 LAB — ANA,IFA RA DIAG PNL W/RFLX TIT/PATN
Cyclic Citrullin Peptide Ab: 7 U (ref 0–19)
Rheumatoid fact SerPl-aCnc: 11 [IU]/mL (ref <14.0)

## 2023-03-19 LAB — C-REACTIVE PROTEIN: CRP: 90 mg/L — ABNORMAL HIGH (ref 0–10)

## 2023-03-20 ENCOUNTER — Telehealth: Payer: Self-pay

## 2023-03-20 NOTE — Telephone Encounter (Signed)
Patient informed and will go to ER

## 2023-03-20 NOTE — Telephone Encounter (Signed)
Spoke with patient regarding results

## 2023-03-20 NOTE — Telephone Encounter (Signed)
Attempted call to patient to let her know clavicle x-ray results.  Will patient need an  appt as she is still in pain?

## 2023-03-20 NOTE — Telephone Encounter (Signed)
Copied from CRM [id number]. Topic: Clinical - Lab/Test Results >> Mar 20, 2023  2:02 PM [name] wrote: Reason for CRM; Patient called Nurse Selena Batten for Results//Please call back

## 2023-03-24 ENCOUNTER — Other Ambulatory Visit: Payer: Self-pay | Admitting: Family Medicine

## 2023-03-24 ENCOUNTER — Telehealth: Payer: Self-pay | Admitting: Orthopaedic Surgery

## 2023-03-24 NOTE — Telephone Encounter (Signed)
Patient called. Would like to know if there's anything that can be done for her pain? Her cb# [phone]

## 2023-03-25 ENCOUNTER — Telehealth: Payer: Self-pay | Admitting: Orthopaedic Surgery

## 2023-03-25 MED ORDER — CETIRIZINE HCL 10 MG PO TABS: 10.0000 mg | ORAL_TABLET | Freq: Every day | ORAL | 2 refills | Status: DC

## 2023-03-25 MED ORDER — ICOSAPENT ETHYL 1 G PO CAPS: 1.0000 g | ORAL_CAPSULE | Freq: Two times a day (BID) | ORAL | 5 refills | Status: DC

## 2023-03-25 NOTE — Telephone Encounter (Signed)
Pt called requesting gel injection for bil knee synvisc. [name] please send submission after 1/06. Please call pt at [phone]. Pt states she willing to try gel injection

## 2023-04-02 ENCOUNTER — Telehealth: Payer: Self-pay

## 2023-04-02 ENCOUNTER — Ambulatory Visit: Payer: Medicare PPO | Admitting: Family Medicine

## 2023-04-02 DIAGNOSIS — E1140 Type 2 diabetes mellitus with diabetic neuropathy, unspecified: Secondary | ICD-10-CM | POA: Diagnosis not present

## 2023-04-02 DIAGNOSIS — Z794 Long term (current) use of insulin: Secondary | ICD-10-CM | POA: Diagnosis not present

## 2023-04-02 NOTE — Telephone Encounter (Signed)
 Copied from CRM [id number]. Topic: Clinical - Medication Question >> Apr 01, 2023 11:01 AM Almira Coaster wrote: Reason for CRM: Pharmacy informed patient that a pre-authorization is required for her Ozempic prescription.

## 2023-04-03 ENCOUNTER — Other Ambulatory Visit: Payer: Self-pay | Admitting: Family Medicine

## 2023-04-04 ENCOUNTER — Other Ambulatory Visit: Payer: Self-pay

## 2023-04-04 MED ORDER — METHOCARBAMOL 500 MG PO TABS
500.0000 mg | ORAL_TABLET | Freq: Four times a day (QID) | ORAL | 0 refills | Status: DC
  Filled 2023-04-04: qty 120, 30d supply, fill #0

## 2023-04-08 ENCOUNTER — Other Ambulatory Visit: Payer: Self-pay | Admitting: Family Medicine

## 2023-04-08 ENCOUNTER — Telehealth: Payer: Self-pay

## 2023-04-08 NOTE — Telephone Encounter (Signed)
 Initiated Prior authorization for: Ozempic  Via: Covermymeds Case/Key:[id number] Status: DENIED.   Reason: Per cover my meds, an shara was process for Ozempic  8 mg on 04/02/23. Shara was denied. All strengths of Ozempic  was grandfather in the denial. Appeals is now required. Process submitted. Request placed as expedited appeal review.  Notified Pt via: Mychart

## 2023-04-08 NOTE — Telephone Encounter (Signed)
 Copied from CRM [id number]. Topic: Clinical - Medication Question >> Apr 08, 2023 11:09 AM Joesph PARAS wrote: Reason for CRM: Patient is calling regarding medications she requested be refilled. Patient first states she called two weeks ago, then states that she called a week ago. Informed patient that one request for Soma  was received on Friday, 01/09 and another this morning around 10 AM for Lipitor. Patient requested to know why they have not been filled yet and why they are always going to the wrong pharmacy. Informed patient that providers have three business days to review and process medication refill requests. Provided affirmative that refills should be requested before medication is completely out to ensure patient does not run out of medications while waiting for the refill to be sent in. Informed patient that requested pharmacy on refills requests is not the preferred and that when submitting requests, she should verify the pharmacy is correct. Patient requesting these two medications go to [facility] and that other pharmacies on file be removed.

## 2023-04-08 NOTE — Telephone Encounter (Signed)
 Copied from CRM [id number]. Topic: Clinical - Medication Question >> Apr 08, 2023 11:05 AM Joesph PARAS wrote: Reason for CRM: Patient calling to request an update on her PA for Ozempic . She states she called over a week ago and needs it because she cannot proceed towards her knee replacement without it.

## 2023-04-09 NOTE — Telephone Encounter (Signed)
VOB submitted for Durolane, bilateral knee  

## 2023-04-09 NOTE — Telephone Encounter (Signed)
 Requesting rx rf of atorvastatin  20mg   To [facility] pharmacy Last written by historical provider Last OV 03/14/2023 Upcoming appt 05/15/2023

## 2023-04-11 ENCOUNTER — Other Ambulatory Visit: Payer: Self-pay

## 2023-04-11 ENCOUNTER — Other Ambulatory Visit: Payer: Self-pay | Admitting: Family Medicine

## 2023-04-11 ENCOUNTER — Encounter: Payer: Self-pay | Admitting: Family Medicine

## 2023-04-11 DIAGNOSIS — G8929 Other chronic pain: Secondary | ICD-10-CM

## 2023-04-11 DIAGNOSIS — E1165 Type 2 diabetes mellitus with hyperglycemia: Secondary | ICD-10-CM

## 2023-04-11 DIAGNOSIS — M791 Myalgia, unspecified site: Secondary | ICD-10-CM

## 2023-04-11 DIAGNOSIS — M7918 Myalgia, other site: Secondary | ICD-10-CM

## 2023-04-11 MED ORDER — CARISOPRODOL 350 MG PO TABS: 350.0000 mg | ORAL_TABLET | Freq: Three times a day (TID) | ORAL | 0 refills | Status: AC | PRN

## 2023-04-11 MED ORDER — ATORVASTATIN CALCIUM 20 MG PO TABS: 20.0000 mg | ORAL_TABLET | Freq: Every day | ORAL | 1 refills | Status: DC

## 2023-04-14 ENCOUNTER — Telehealth: Payer: Self-pay

## 2023-04-14 NOTE — Progress Notes (Unsigned)
Care Guide Pharmacy Note  04/14/2023 Name: [name] MRN: [id number] DOB: 03-04-1971  Referred By: Charlton Amor, DO Reason for referral: Care Coordination (Outreach to schedule with pharm  d )   [name] is a 53 y.o. year old female who is a primary care patient of Charlton Amor, DO.  [name] was referred to the pharmacist for assistance related to: DMII  An unsuccessful telephone outreach was attempted today to contact the patient who was referred to the pharmacy team for assistance with medication assistance. Additional attempts will be made to contact the patient.  Penne Lash , RMA     [facility]  [facility], [facility] Guide  Direct Dial: [phone]  Website: [email or web address]

## 2023-04-16 ENCOUNTER — Ambulatory Visit: Payer: Medicare PPO | Admitting: Orthopaedic Surgery

## 2023-04-16 ENCOUNTER — Telehealth: Payer: Self-pay

## 2023-04-16 ENCOUNTER — Other Ambulatory Visit: Payer: Self-pay

## 2023-04-16 DIAGNOSIS — I872 Venous insufficiency (chronic) (peripheral): Secondary | ICD-10-CM | POA: Insufficient documentation

## 2023-04-16 MED ORDER — NEXIUM 40 MG PO CPDR: 40.0000 mg | DELAYED_RELEASE_CAPSULE | Freq: Every day | ORAL | 2 refills | Status: DC

## 2023-04-16 NOTE — Telephone Encounter (Signed)
Prior auth for: NEXIUM *DAW Determination: Pending as of 04/16/23 Auth #: [id number] Valid from: n/a     Prior auth for: CARISOPRODOL  Determination: Pending as of 04/16/23 Auth #: [id number] Valid from: n/a

## 2023-04-16 NOTE — Telephone Encounter (Signed)
Hi,  I reached out to Mrs. Selena Batten and gave her your information, she should be returning your call soon. Roselyn Reef, CMA

## 2023-04-17 ENCOUNTER — Telehealth: Payer: Self-pay

## 2023-04-17 NOTE — Telephone Encounter (Signed)
Prior auth for: NEXIUM *DAW Determination: DENIED. Auth #: [id number] Reason: patient need to try 4 of the covered drugs: dexlansoprazole, lansoprazole, omeprazole, pantoprazole tab, reberprazole OR a specific reason why four of the covering drugs are not appropriate for you.     Prior auth for: CARISOPRODOL  Determination: APPROVED Auth #: [id number] Valid from: 04/16/23 TO 03/24/24 Patient notified via MyChart

## 2023-04-17 NOTE — Telephone Encounter (Signed)
Initiated Prior authorization for: Ozempic Via: Covermymeds Case/Key:[id number] Status: DENIED.   Reason: Per cover my meds, an Berkley Harvey was process for Ozempic 8 mg on 04/02/23. Berkley Harvey was denied. All strengths of Ozempic was grandfather in the denial. Appeals is now required. Process submitted. Request placed as expedited appeal review.

## 2023-04-18 ENCOUNTER — Other Ambulatory Visit: Payer: Self-pay | Admitting: Family Medicine

## 2023-04-20 ENCOUNTER — Encounter: Payer: Self-pay | Admitting: Family Medicine

## 2023-04-23 NOTE — Telephone Encounter (Signed)
Tiffany called and spoke to patient   DM is actually managed by endocrine as she was sent a referral by Raynelle Fanning, NP for management of T2DM. Discussed they are the ones that she should reach out to about ozempic.

## 2023-04-24 DIAGNOSIS — Z79899 Other long term (current) drug therapy: Secondary | ICD-10-CM | POA: Diagnosis not present

## 2023-04-24 DIAGNOSIS — M542 Cervicalgia: Secondary | ICD-10-CM | POA: Diagnosis not present

## 2023-04-24 DIAGNOSIS — Z760 Encounter for issue of repeat prescription: Secondary | ICD-10-CM | POA: Diagnosis not present

## 2023-04-25 ENCOUNTER — Ambulatory Visit: Payer: Medicare PPO

## 2023-04-28 ENCOUNTER — Encounter: Payer: Self-pay | Admitting: Family Medicine

## 2023-04-29 ENCOUNTER — Telehealth: Payer: Medicare Other | Admitting: Family Medicine

## 2023-04-29 ENCOUNTER — Encounter: Payer: Self-pay | Admitting: Family Medicine

## 2023-04-29 VITALS — Temp 101.1°F | Ht 65.0 in

## 2023-04-29 DIAGNOSIS — J111 Influenza due to unidentified influenza virus with other respiratory manifestations: Secondary | ICD-10-CM | POA: Insufficient documentation

## 2023-04-29 MED ORDER — HYDROCOD POLI-CHLORPHE POLI ER 10-8 MG/5ML PO SUER: 5.0000 mL | Freq: Two times a day (BID) | ORAL | 0 refills | Status: DC | PRN

## 2023-04-29 MED ORDER — METHYLPREDNISOLONE 4 MG PO TBPK: ORAL_TABLET | ORAL | 0 refills | Status: DC

## 2023-04-29 MED ORDER — OSELTAMIVIR PHOSPHATE 75 MG PO CAPS: 75.0000 mg | ORAL_CAPSULE | Freq: Two times a day (BID) | ORAL | 0 refills | Status: DC

## 2023-04-29 NOTE — Assessment & Plan Note (Signed)
 Pt on telehealth visit with exposure to flu and symptoms of fever, body aches, sore throat and cough - have sent in tamiflu  for treatment  - have also sent in tussionex for dry cough - medrol  dose pack given since pt has hoarse voice and admits to some tonsillar swelling - gave pt ED precautions, denies any shortness of breath at this time  - if cough does not resolve in one week advised her we may need to get cxr to look for pneumonia - encouraged hydration and tylenol  for pain and fever control  - pt does have a neighbor who can bring her food and gatorade

## 2023-04-29 NOTE — Progress Notes (Signed)
 Established patient visit   Patient: [name]   DOB: 07-14-1970   53 y.o. Female  MRN: [id number] Visit Date: 04/29/2023  Today's healthcare provider: Bernice GORMAN Juneau, DO   Chief Complaint  Patient presents with   Cough    SUBJECTIVE    Chief Complaint  Patient presents with   Cough   HPI  I connected with  [name] on 04/29/23 by a video and audio enabled telemedicine application and verified that I am speaking with the correct person using two identifiers.  Patient Location: Home  Provider Location: Office/Clinic  I discussed the limitations of evaluation and management by telemedicine. The patient expressed understanding and agreed to proceed.  Pt recently exposed to flu. Admits to coughing and congestion.   Review of Systems  Constitutional:  Positive for fever. Negative for activity change and fatigue.  HENT:  Positive for congestion.   Respiratory:  Positive for cough. Negative for shortness of breath.   Cardiovascular:  Negative for chest pain.  Gastrointestinal:  Negative for abdominal pain.  Genitourinary:  Negative for difficulty urinating.       Current Meds  Medication Sig   acetaminophen  (TYLENOL ) 500 MG tablet Take 500 mg by mouth every 6 (six) hours as needed for moderate pain.   ASPIRIN  LOW DOSE 81 MG EC tablet Take 81 mg by mouth daily.   atorvastatin  (LIPITOR) 20 MG tablet Take 1 tablet (20 mg total) by mouth daily.   Biotin  10000 MCG TABS Take 10,000 mcg by mouth daily.   buPROPion  (WELLBUTRIN  XL) 150 MG 24 hr tablet Take 1 tablet (150 mg total) by mouth daily.   busPIRone  (BUSPAR ) 10 MG tablet Take 10 mg by mouth 2 (two) times daily.   carisoprodol  (SOMA ) 350 MG tablet Take 1 tablet (350 mg total) by mouth 3 (three) times daily as needed for muscle spasms.   cetirizine  (ZYRTEC ) 10 MG tablet Take 1 tablet (10 mg total) by mouth daily.   chlorpheniramine-HYDROcodone  (TUSSIONEX) 10-8 MG/5ML Take 5 mLs by mouth every 12 (twelve)  hours as needed for cough (cough, will cause drowsiness.).   Cholecalciferol  20 MCG (800 UNIT) TABS Take 1 tablet by mouth daily.   Cyanocobalamin  (VITAMIN DEFICIENCY SYSTEM-B12) 1000 MCG/ML KIT Inject 1,000 mcg as directed every 30 (thirty) days.   eletriptan  (RELPAX ) 40 MG tablet Take 40 mg by mouth every 2 (two) hours as needed for headache.   gabapentin  (NEURONTIN ) 600 MG tablet Take 0.5 tablets (300 mg total) by mouth at bedtime. (Patient taking differently: Take 600 mg by mouth 3 (three) times daily.)   icosapent  Ethyl (VASCEPA ) 1 g capsule Take 1 capsule (1 g total) by mouth 2 (two) times daily.   insulin  aspart protamine - aspart (NOVOLOG  MIX 70/30 FLEXPEN) (70-30) 100 UNIT/ML FlexPen Inject 60 Units into the skin 3 (three) times daily after meals.   levETIRAcetam  (KEPPRA ) 750 MG tablet Take 750 mg by mouth in the morning, at noon, and at bedtime.   levothyroxine  (SYNTHROID ) 150 MCG tablet TAKE ONE TABLET BY MOUTH IN THE MORNING   lidocaine  (LIDODERM ) 5 % Place 3 patches onto the skin See admin instructions. Place 3 patches daily to affected areas and replace every 24 hours   lisinopril  (ZESTRIL ) 10 MG tablet Take 10 mg by mouth daily.   meclizine  (ANTIVERT ) 25 MG tablet Take 1 tablet (25 mg total) by mouth 3 (three) times daily as needed for dizziness.   methylPREDNISolone  (MEDROL  DOSEPAK) 4 MG TBPK tablet  Follow instructions on pill pack   NEXIUM  40 MG capsule Take 1 capsule (40 mg total) by mouth daily.   oseltamivir  (TAMIFLU ) 75 MG capsule Take 1 capsule (75 mg total) by mouth 2 (two) times daily.   Oxycodone  HCl 10 MG TABS Take 1 tablet (10 mg total) by mouth every 6 (six) hours as needed (for pain).   Prenatal Vit-Fe Fumarate-FA (WESTAB PLUS) 27-1 MG TABS Take 1 tablet by mouth daily.   promethazine  (PHENERGAN ) 25 MG tablet Take 25 mg by mouth every 8 (eight) hours as needed for nausea or vomiting.   Semaglutide , 2 MG/DOSE, 8 MG/3ML SOPN Inject 2 mg as directed once a week.    sertraline  (ZOLOFT ) 50 MG tablet Take 50 mg by mouth at bedtime.   triamcinolone  cream (KENALOG ) 0.1 % Apply 1 application topically 2 (two) times daily as needed (skin rash).    OBJECTIVE    Temp (!) 101.1 F (38.4 C) (Oral) Comment: pt reported  Ht 5' 5 (1.651 m)   BMI 44.31 kg/m   Physical Exam Vitals reviewed.  Constitutional:      Appearance: She is well-developed.  HENT:     Head: Normocephalic and atraumatic.     Comments: Hoarse voice, dry cough    Nose: Congestion present.  Eyes:     Conjunctiva/sclera: Conjunctivae normal.  Cardiovascular:     Rate and Rhythm: Normal rate.  Pulmonary:     Effort: Pulmonary effort is normal.  Skin:    General: Skin is dry.     Coloration: Skin is not pale.  Neurological:     Mental Status: She is alert and oriented to person, place, and time.  Psychiatric:        Behavior: Behavior normal.        ASSESSMENT & PLAN    Problem List Items Addressed This Visit       Respiratory   Influenza - Primary   Pt on telehealth visit with exposure to flu and symptoms of fever, body aches, sore throat and cough - have sent in tamiflu  for treatment  - have also sent in tussionex for dry cough - medrol  dose pack given since pt has hoarse voice and admits to some tonsillar swelling - gave pt ED precautions, denies any shortness of breath at this time  - if cough does not resolve in one week advised her we may need to get cxr to look for pneumonia - encouraged hydration and tylenol  for pain and fever control  - pt does have a neighbor who can bring her food and gatorade      Relevant Medications   methylPREDNISolone  (MEDROL  DOSEPAK) 4 MG TBPK tablet   oseltamivir  (TAMIFLU ) 75 MG capsule   chlorpheniramine-HYDROcodone  (TUSSIONEX) 10-8 MG/5ML    Return if symptoms worsen or fail to improve.      Meds ordered this encounter  Medications   methylPREDNISolone  (MEDROL  DOSEPAK) 4 MG TBPK tablet    Sig: Follow instructions on pill  pack    Dispense:  21 tablet    Refill:  0   oseltamivir  (TAMIFLU ) 75 MG capsule    Sig: Take 1 capsule (75 mg total) by mouth 2 (two) times daily.    Dispense:  10 capsule    Refill:  0   chlorpheniramine-HYDROcodone  (TUSSIONEX) 10-8 MG/5ML    Sig: Take 5 mLs by mouth every 12 (twelve) hours as needed for cough (cough, will cause drowsiness.).    Dispense:  115 mL    Refill:  0    No orders of the defined types were placed in this encounter.    Bernice GORMAN Juneau, DO  [facility] Primary Care & Sports Medicine at [facility] [phone] (phone) [phone] (fax)  [facility]

## 2023-04-30 ENCOUNTER — Other Ambulatory Visit: Payer: Self-pay

## 2023-04-30 ENCOUNTER — Encounter: Payer: Self-pay | Admitting: Family Medicine

## 2023-04-30 DIAGNOSIS — J111 Influenza due to unidentified influenza virus with other respiratory manifestations: Secondary | ICD-10-CM

## 2023-04-30 MED ORDER — HYDROCOD POLI-CHLORPHE POLI ER 10-8 MG/5ML PO SUER: 5.0000 mL | Freq: Two times a day (BID) | ORAL | 0 refills | Status: DC | PRN

## 2023-05-02 NOTE — Progress Notes (Signed)
 Care Guide Pharmacy Note  05/02/2023 Name: [name] MRN: [id number] DOB: 1970/06/05  Referred By: Bevin Bernice RAMAN, DO Reason for referral: Care Coordination (Outreach to schedule with pharm  d )   [name] is a 53 y.o. year old female who is a primary care patient of Bevin Bernice RAMAN, DO.  [name] was referred to the pharmacist for assistance related to: DMII  A second unsuccessful telephone outreach was attempted today to contact the patient who was referred to the pharmacy team for assistance with medication management. Additional attempts will be made to contact the patient.  Jeoffrey Buffalo , RMA     [facility]  [facility], [facility] Guide  Direct Dial: [phone]  Website: [email or web address]

## 2023-05-13 NOTE — Progress Notes (Signed)
 Care Guide Pharmacy Note  05/13/2023 Name: [name] MRN: [id number] DOB: 1970-05-10  Referred By: Charlton Amor, DO Reason for referral: Care Coordination (Outreach to schedule with pharm  d )   [name] is a 53 y.o. year old female who is a primary care patient of Charlton Amor, DO.  [name] was referred to the pharmacist for assistance related to: DMII  A third unsuccessful telephone outreach was attempted today to contact the patient who was referred to the pharmacy team for assistance with medication management. The Population Health team is pleased to engage with this patient at any time in the future upon receipt of referral and should he/she be interested in assistance from the [facility] team.  Penne Lash , RMA     [facility]  [facility], [facility] Guide  Direct Dial: [phone]  Website: [email or web address]

## 2023-05-15 ENCOUNTER — Ambulatory Visit: Payer: Medicare PPO | Admitting: Family Medicine

## 2023-05-15 ENCOUNTER — Ambulatory Visit: Payer: Medicare Other | Admitting: Family Medicine

## 2023-05-19 ENCOUNTER — Encounter: Payer: Self-pay | Admitting: Medical-Surgical

## 2023-05-19 NOTE — Telephone Encounter (Signed)
 Patient scheduled tomorrow with Tandy Gaw, PA

## 2023-05-20 ENCOUNTER — Ambulatory Visit: Payer: Medicare Other | Admitting: Physician Assistant

## 2023-05-20 ENCOUNTER — Telehealth: Payer: Self-pay | Admitting: Medical-Surgical

## 2023-05-20 NOTE — Telephone Encounter (Signed)
 Copied from CRM [id number]. Topic: Appointments - Appointment Cancel/Reschedule >> May 20, 2023 10:16 AM Turkey B wrote: Pt called in, cancelled appt for today, because went to the hospital

## 2023-05-22 DIAGNOSIS — Z79899 Other long term (current) drug therapy: Secondary | ICD-10-CM | POA: Diagnosis not present

## 2023-05-22 DIAGNOSIS — G5603 Carpal tunnel syndrome, bilateral upper limbs: Secondary | ICD-10-CM | POA: Diagnosis not present

## 2023-05-22 DIAGNOSIS — Z760 Encounter for issue of repeat prescription: Secondary | ICD-10-CM | POA: Diagnosis not present

## 2023-05-27 ENCOUNTER — Other Ambulatory Visit: Payer: Self-pay | Admitting: Family Medicine

## 2023-05-27 ENCOUNTER — Encounter: Payer: Self-pay | Admitting: Medical-Surgical

## 2023-05-27 NOTE — Telephone Encounter (Signed)
 Last Fill: 05/30/17  Last OV: 04/29/23 Next OV: 06/13/23  Routing to provider for review/authorization.

## 2023-05-27 NOTE — Telephone Encounter (Signed)
 Copied from CRM [id number]. Topic: Clinical - Medication Refill >> May 27, 2023  2:42 PM Gaetano Hawthorne wrote: Most Recent Primary Care Visit:  Provider: Charlton Amor  Department: [facility]  Visit Type: MYCHART VIDEO VISIT  Date: 04/29/2023  Medication: lidocaine (LIDODERM) 5 %  Has the patient contacted their pharmacy? Yes, they need a new script to fill it (Agent: If no, request that the patient contact the pharmacy for the refill. If patient does not wish to contact the pharmacy document the reason why and proceed with request.) (Agent: If yes, when and what did the pharmacy advise?)  Is this the correct pharmacy for this prescription? Yes If no, delete pharmacy and type the correct one.  This is the patient's preferred pharmacy:  [facility] [place], [place] - [address] [place] [address] Phone: [phone] Fax: [phone]  [id number] - [place], [place] - [address] AT [facility] [address] [place] [address] Phone: [phone] Fax: [phone]   Has the prescription been filled recently? No  Is the patient out of the medication? Yes  Has the patient been seen for an appointment in the last year OR does the patient have an upcoming appointment? Yes  Can we respond through MyChart? Yes  Agent: Please be advised that Rx refills may take up to 3 business days. We ask that you follow-up with your pharmacy.

## 2023-05-30 MED ORDER — LIDOCAINE 5 % EX PTCH: 3.0000 | MEDICATED_PATCH | CUTANEOUS | 0 refills | Status: DC

## 2023-05-31 ENCOUNTER — Other Ambulatory Visit: Payer: Self-pay | Admitting: Family Medicine

## 2023-06-09 ENCOUNTER — Other Ambulatory Visit: Payer: Self-pay | Admitting: Family Medicine

## 2023-06-13 ENCOUNTER — Other Ambulatory Visit: Payer: Self-pay | Admitting: Family Medicine

## 2023-06-13 ENCOUNTER — Ambulatory Visit: Payer: Medicare Other | Admitting: Medical-Surgical

## 2023-06-13 NOTE — Telephone Encounter (Signed)
 Filled lidocaine patches but held off on psychiatric medications until her appointment today in case changes or adjustments need to be done.

## 2023-06-18 NOTE — Telephone Encounter (Signed)
 Forwarding to covering provider

## 2023-06-19 DIAGNOSIS — Z79899 Other long term (current) drug therapy: Secondary | ICD-10-CM | POA: Diagnosis not present

## 2023-06-19 DIAGNOSIS — M542 Cervicalgia: Secondary | ICD-10-CM | POA: Diagnosis not present

## 2023-06-19 DIAGNOSIS — Z760 Encounter for issue of repeat prescription: Secondary | ICD-10-CM | POA: Diagnosis not present

## 2023-06-20 MED ORDER — SERTRALINE HCL 50 MG PO TABS: 50.0000 mg | ORAL_TABLET | Freq: Every day | ORAL | 0 refills | Status: DC

## 2023-06-26 MED ORDER — PROMETHAZINE HCL 25 MG PO TABS: 25.0000 mg | ORAL_TABLET | Freq: Three times a day (TID) | ORAL | 0 refills | Status: DC | PRN

## 2023-06-26 NOTE — Telephone Encounter (Signed)
 Spoke with patient - scheduled appt with Heather jessup,NP tomorrow  Asked patient if she was having thoughts of self harm or a plan and she stated she did not want to discuss that  Offered patient suicide prevention number but she refused.  She is in need of some medication refills - is Dr. Tamera Punt patient Forwarded these request to Brayton El  Had already forwarded request for Sertraline refill  to Dr. Linford Arnold  But patient states is  taking 100mg  twice per day and showing in chart as 50mg  .  She states she does desperately need this refilled asap.

## 2023-06-26 NOTE — Telephone Encounter (Signed)
 Again attempted call to patient. Again left voice mail message requesting a return call.

## 2023-06-26 NOTE — Addendum Note (Signed)
 Addended by: Elizabeth Palau on: 06/26/2023 04:15 PM   Modules accepted: Orders

## 2023-06-26 NOTE — Telephone Encounter (Signed)
 Attempted call to patient. Left a voice mail requesting a return call.  In patient attached picture of Sertraline showing strength of 100mg   taken twice daily  This is different than in patient chart that refill was sent on 06/20/23 Forwarding message to Dr. Linford Arnold covering for Dr. Tamera Punt.

## 2023-06-26 NOTE — Telephone Encounter (Signed)
 Forwarding refill request to joy jessup , NP  Patient has upcoming appt with joy jessup, NP tomorrow.

## 2023-06-26 NOTE — Addendum Note (Signed)
 Addended by: Nani Gasser D on: 06/26/2023 06:09 PM   Modules accepted: Orders

## 2023-06-26 NOTE — Telephone Encounter (Signed)
 I agree I think she just needs to discuss the sertraline at the appointment.  We have documented 50 mg.  We have never written for anything higher and if that is what she was getting from her previous provider then we just need to verify that.  We have not received a request from her pharmacy.  This is why it is really important that her medication list really reflects what she is taking.  I did send over a small quantity of Phenergan.  As far as the Nexium is concerned that was sent in January for 90 tabs with 2 refills so she just needs to call her pharmacy to have the next prescription filled if she is out.  Can discuss the cream tomorrow as well.  Not sure what this is used for if she has eczema etc.?

## 2023-06-27 ENCOUNTER — Ambulatory Visit (INDEPENDENT_AMBULATORY_CARE_PROVIDER_SITE_OTHER): Admitting: Family Medicine

## 2023-06-27 ENCOUNTER — Encounter: Payer: Self-pay | Admitting: Medical-Surgical

## 2023-06-27 VITALS — BP 95/65 | HR 106 | Resp 20 | Ht 65.0 in | Wt 269.1 lb

## 2023-06-27 DIAGNOSIS — R197 Diarrhea, unspecified: Secondary | ICD-10-CM | POA: Diagnosis not present

## 2023-06-27 DIAGNOSIS — L84 Corns and callosities: Secondary | ICD-10-CM | POA: Diagnosis not present

## 2023-06-27 DIAGNOSIS — Z794 Long term (current) use of insulin: Secondary | ICD-10-CM | POA: Diagnosis not present

## 2023-06-27 DIAGNOSIS — F331 Major depressive disorder, recurrent, moderate: Secondary | ICD-10-CM | POA: Diagnosis not present

## 2023-06-27 DIAGNOSIS — E1165 Type 2 diabetes mellitus with hyperglycemia: Secondary | ICD-10-CM | POA: Diagnosis not present

## 2023-06-27 DIAGNOSIS — E039 Hypothyroidism, unspecified: Secondary | ICD-10-CM

## 2023-06-27 DIAGNOSIS — R748 Abnormal levels of other serum enzymes: Secondary | ICD-10-CM

## 2023-06-27 DIAGNOSIS — I10 Essential (primary) hypertension: Secondary | ICD-10-CM | POA: Diagnosis not present

## 2023-06-27 DIAGNOSIS — D72829 Elevated white blood cell count, unspecified: Secondary | ICD-10-CM

## 2023-06-27 DIAGNOSIS — R112 Nausea with vomiting, unspecified: Secondary | ICD-10-CM | POA: Diagnosis not present

## 2023-06-27 MED ORDER — TRIAMCINOLONE ACETONIDE 0.1 % EX CREA: 1.0000 | TOPICAL_CREAM | Freq: Two times a day (BID) | CUTANEOUS | 0 refills | Status: DC | PRN

## 2023-06-27 MED ORDER — PROMETHAZINE HCL 25 MG PO TABS: 25.0000 mg | ORAL_TABLET | Freq: Three times a day (TID) | ORAL | 1 refills | Status: DC | PRN

## 2023-06-27 MED ORDER — SERTRALINE HCL 100 MG PO TABS: 200.0000 mg | ORAL_TABLET | Freq: Every day | ORAL | 1 refills | Status: DC
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
## 2023-06-27 NOTE — Assessment & Plan Note (Signed)
Needs refill on nausea med.

## 2023-06-27 NOTE — Assessment & Plan Note (Signed)
 BP looks great. She does up and down.

## 2023-06-27 NOTE — Telephone Encounter (Signed)
 Patient informed.
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
## 2023-06-27 NOTE — Assessment & Plan Note (Signed)
 Up-to-date thyroid level today.  She is currently taking levothyroxine 150 mcg once daily.  Prescription is up-to-date it was just refilled last month for 90 days.

## 2023-06-27 NOTE — Assessment & Plan Note (Signed)
 Follows with Dr. Morrison Old and they have actually been able to get her A1c under 8 recently which is really exciting because she needs bilateral knee surgery which will likely be scheduled in June.

## 2023-06-28 LAB — CMP14+EGFR
ALT: 16 IU/L (ref 0–32)
AST: 20 IU/L (ref 0–40)
Albumin: 4.4 g/dL (ref 3.8–4.9)
Alkaline Phosphatase: 124 IU/L — ABNORMAL HIGH (ref 44–121)
BUN/Creatinine Ratio: 31 — ABNORMAL HIGH (ref 9–23)
BUN: 40 mg/dL — ABNORMAL HIGH (ref 6–24)
Bilirubin Total: <0.2 mg/dL (ref 0.0–1.2)
CO2: 16 mmol/L — ABNORMAL LOW (ref 20–29)
Calcium: 9.5 mg/dL (ref 8.7–10.2)
Chloride: 105 mmol/L (ref 96–106)
Creatinine, Ser: 1.28 mg/dL — ABNORMAL HIGH (ref 0.57–1.00)
Globulin, Total: 2.4 g/dL (ref 1.5–4.5)
Glucose: 108 mg/dL — ABNORMAL HIGH (ref 70–99)
Potassium: 4.5 mmol/L (ref 3.5–5.2)
Sodium: 141 mmol/L (ref 134–144)
Total Protein: 6.8 g/dL (ref 6.0–8.5)
eGFR: 50 mL/min/{1.73_m2} — ABNORMAL LOW (ref >59)

## 2023-06-28 LAB — CBC WITH DIFFERENTIAL/PLATELET
Basophils Absolute: 0 10*3/uL (ref 0.0–0.2)
Basos: 0 %
EOS (ABSOLUTE): 0.1 10*3/uL (ref 0.0–0.4)
Eos: 1 %
Hematocrit: 43.7 % (ref 34.0–46.6)
Hemoglobin: 14.3 g/dL (ref 11.1–15.9)
Immature Grans (Abs): 0 10*3/uL (ref 0.0–0.1)
Immature Granulocytes: 0 %
Lymphocytes Absolute: 4.4 10*3/uL — ABNORMAL HIGH (ref 0.7–3.1)
Lymphs: 33 %
MCH: 31 pg (ref 26.6–33.0)
MCHC: 32.7 g/dL (ref 31.5–35.7)
MCV: 95 fL (ref 79–97)
Monocytes Absolute: 0.8 10*3/uL (ref 0.1–0.9)
Monocytes: 6 %
Neutrophils Absolute: 7.9 10*3/uL — ABNORMAL HIGH (ref 1.4–7.0)
Neutrophils: 60 %
Platelets: 311 10*3/uL (ref 150–450)
RBC: 4.62 x10E6/uL (ref 3.77–5.28)
RDW: 12.4 % (ref 11.7–15.4)
WBC: 13.3 10*3/uL — ABNORMAL HIGH (ref 3.4–10.8)

## 2023-06-28 LAB — SPECIMEN STATUS REPORT

## 2023-06-28 LAB — TSH: TSH: 2.4 u[IU]/mL (ref 0.450–4.500)

## 2023-06-30 ENCOUNTER — Encounter: Payer: Self-pay | Admitting: Family Medicine

## 2023-06-30 NOTE — Progress Notes (Signed)
 Hi [name], glad we got some updated lab work your kidney function is elevated and you look dehydrated.  I really want you to try to push some fluids this week and then lets plan to recheck it again either Wednesday or Thursday of this week.  But you definitely looked a little dehydrated.  Your alkaline phosphatase which is a liver enzyme is slightly elevated but it is better than it was 4 months ago which is great and your AST and ALT were sure additional liver enzymes look great as well.  Blood count overall looks okay your white cells are up just a little bit we can see that sometimes if there is an infection or a virus.  Thyroid level looks great at 2.4.

## 2023-07-04 ENCOUNTER — Telehealth: Payer: Self-pay

## 2023-07-04 NOTE — Addendum Note (Signed)
 Addended by: Nani Gasser D on: 07/04/2023 12:55 PM   Modules accepted: Orders

## 2023-07-04 NOTE — Telephone Encounter (Signed)
 Left message advising patient she can wait until Monday.

## 2023-07-04 NOTE — Telephone Encounter (Signed)
 Copied from CRM [id number]. Topic: General - Other >> Jul 04, 2023 12:07 PM Antony Haste wrote: Reason for CRM: The patient will be unable to visit the office to complete her repeat of labwork this afternoon due to having a flat tire. She is wanting to know if it's okay to continue to push her fluids until Monday when she is able to make it to the office? She would like a callback to discuss this further with CMA Irving Burton.  Callback #:[phone]

## 2023-07-04 NOTE — Progress Notes (Signed)
 Labs ordered she can come anytime.

## 2023-07-18 NOTE — Telephone Encounter (Signed)
 This request has been handled. No further action is required.

## 2023-07-21 ENCOUNTER — Telehealth: Payer: Self-pay | Admitting: Orthopaedic Surgery

## 2023-07-21 DIAGNOSIS — E1165 Type 2 diabetes mellitus with hyperglycemia: Secondary | ICD-10-CM | POA: Diagnosis not present

## 2023-07-21 DIAGNOSIS — Z1330 Encounter for screening examination for mental health and behavioral disorders, unspecified: Secondary | ICD-10-CM | POA: Diagnosis not present

## 2023-07-21 DIAGNOSIS — Z6841 Body Mass Index (BMI) 40.0 and over, adult: Secondary | ICD-10-CM | POA: Diagnosis not present

## 2023-07-21 DIAGNOSIS — Z794 Long term (current) use of insulin: Secondary | ICD-10-CM | POA: Diagnosis not present

## 2023-07-21 DIAGNOSIS — E1140 Type 2 diabetes mellitus with diabetic neuropathy, unspecified: Secondary | ICD-10-CM | POA: Diagnosis not present

## 2023-07-24 ENCOUNTER — Other Ambulatory Visit (HOSPITAL_COMMUNITY): Payer: Self-pay

## 2023-07-24 ENCOUNTER — Telehealth: Payer: Self-pay

## 2023-07-24 ENCOUNTER — Other Ambulatory Visit: Payer: Self-pay | Admitting: Family Medicine

## 2023-07-24 DIAGNOSIS — Z760 Encounter for issue of repeat prescription: Secondary | ICD-10-CM | POA: Diagnosis not present

## 2023-07-24 DIAGNOSIS — Z79899 Other long term (current) drug therapy: Secondary | ICD-10-CM | POA: Diagnosis not present

## 2023-07-24 DIAGNOSIS — G894 Chronic pain syndrome: Secondary | ICD-10-CM | POA: Diagnosis not present

## 2023-07-24 DIAGNOSIS — M542 Cervicalgia: Secondary | ICD-10-CM | POA: Diagnosis not present

## 2023-07-24 DIAGNOSIS — G43719 Chronic migraine without aura, intractable, without status migrainosus: Secondary | ICD-10-CM | POA: Diagnosis not present

## 2023-07-24 DIAGNOSIS — G5603 Carpal tunnel syndrome, bilateral upper limbs: Secondary | ICD-10-CM | POA: Diagnosis not present

## 2023-07-24 DIAGNOSIS — G253 Myoclonus: Secondary | ICD-10-CM | POA: Diagnosis not present

## 2023-07-24 MED ORDER — CYANOCOBALAMIN 1000 MCG/ML IJ SOLN: 1000.0000 ug | INTRAMUSCULAR | 4 refills | Status: DC

## 2023-07-24 NOTE — Telephone Encounter (Signed)
 Please call Dr. Barbee Bookman Heim's office and see how they would feel about us  bumping up her Wellbutrin  and if higher doses can increase risk for seizures and she already has a seizure disorder so I told her that I did not think this would be a good idea but that I would check with Dr. Patriciaann Boone and if he is comfortable with it then we can send an updated prescription.   Meds ordered this encounter  Medications   cyanocobalamin  (VITAMIN B12) 1000 MCG/ML injection    Sig: Inject 1 mL (1,000 mcg total) into the muscle every 30 (thirty) days.    Dispense:  3 mL    Refill:  4

## 2023-07-24 NOTE — Telephone Encounter (Signed)
 Pharmacy Patient Advocate Encounter   Received notification from Patient Pharmacy that prior authorization for Cyancobalamin 1000 is required/requested.   Insurance verification completed.   The patient is insured through [employer] .   Per test claim: PA required; PA submitted to above mentioned insurance via CoverMyMeds Key/confirmation #/EOC [id number] Status is pending

## 2023-07-24 NOTE — Telephone Encounter (Signed)
 Pharmacy Patient Advocate Encounter  Received notification from [employer] that Prior Authorization for {Cyanocobalamin  1000 has been DENIED.  Full denial letter will be uploaded to the media tab. See denial reason below.   PA #/Case ID/Reference #: [id number]

## 2023-07-25 MED ORDER — BUPROPION HCL ER (XL) 300 MG PO TB24: 300.0000 mg | ORAL_TABLET | Freq: Every day | ORAL | 0 refills | Status: DC

## 2023-07-25 NOTE — Telephone Encounter (Signed)
 Meds ordered this encounter  Medications   cyanocobalamin  (VITAMIN B12) 1000 MCG/ML injection    Sig: Inject 1 mL (1,000 mcg total) into the muscle every 30 (thirty) days.    Dispense:  3 mL    Refill:  4   buPROPion  (WELLBUTRIN  XL) 300 MG 24 hr tablet    Sig: Take 1 tablet (300 mg total) by mouth daily.    Dispense:  90 tablet    Refill:  0

## 2023-07-25 NOTE — Telephone Encounter (Signed)
 Spoke with Kingsley Penny ( medical assistant ) who spoke to Dr. Arlon Lamb. He states that it is o.k. to increase the wellbutrin .

## 2023-07-31 ENCOUNTER — Encounter: Payer: Self-pay | Admitting: Family Medicine

## 2023-08-04 ENCOUNTER — Ambulatory Visit: Admitting: Orthopaedic Surgery

## 2023-08-04 ENCOUNTER — Encounter: Payer: Self-pay | Admitting: Orthopaedic Surgery

## 2023-08-04 VITALS — Wt 270.0 lb

## 2023-08-04 DIAGNOSIS — M25561 Pain in right knee: Secondary | ICD-10-CM

## 2023-08-04 DIAGNOSIS — M1712 Unilateral primary osteoarthritis, left knee: Secondary | ICD-10-CM | POA: Diagnosis not present

## 2023-08-04 DIAGNOSIS — M1711 Unilateral primary osteoarthritis, right knee: Secondary | ICD-10-CM | POA: Diagnosis not present

## 2023-08-04 DIAGNOSIS — G8929 Other chronic pain: Secondary | ICD-10-CM | POA: Diagnosis not present

## 2023-08-04 DIAGNOSIS — M25562 Pain in left knee: Secondary | ICD-10-CM | POA: Diagnosis not present

## 2023-08-04 NOTE — Progress Notes (Signed)
 [name] is well-known to me.  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  Her deformities are quite significant.  She has now finally got her blood glucose under control through an endocrinologist.  Her last A1c recently was 7.1.  However her weight is still an issue.  Her BMI today is 44.93.  On examination both knees are significant deformed.  Surgery will certainly be a difficult endeavor but not insurmountable.  However we need her weight to be lower before we can schedule the surgery.  She does have pretty good range of motion of the knees.  She knows we are can hold off on any other steroid injections so we do not detrimentally affect her blood sugar.  Will see her back in 3 months from now with a repeat weight and BMI calculation.  I would like an AP and lateral of both knees at that visit since it has been a while since we have x-rayed her knees.

## 2023-08-07 ENCOUNTER — Other Ambulatory Visit (HOSPITAL_COMMUNITY): Payer: Self-pay

## 2023-08-08 ENCOUNTER — Other Ambulatory Visit (HOSPITAL_COMMUNITY): Payer: Self-pay

## 2023-08-08 ENCOUNTER — Telehealth: Payer: Self-pay

## 2023-08-08 NOTE — Telephone Encounter (Signed)
 Pharmacy Patient Advocate Encounter   Received notification from Onbase that prior authorization for Icosapent  Ethyl 1GM capsules is required/requested.   Insurance verification completed.   The patient is insured through [facility] .   Per test claim: PA required; PA submitted to above mentioned insurance via CoverMyMeds Key/confirmation #/EOC [id number] Status is pending

## 2023-08-08 NOTE — Telephone Encounter (Signed)
 Pharmacy Patient Advocate Encounter  Received notification from [facility] that Prior Authorization for Icosapent  Ethyl 1GM capsules has been DENIED.  See denial reason below. No denial letter attached in CMM. Will attach denial letter to Media tab once received.   PA #/Case ID/Reference #: [id number]

## 2023-08-11 ENCOUNTER — Ambulatory Visit: Admitting: Orthopaedic Surgery

## 2023-08-13 ENCOUNTER — Telehealth: Payer: Self-pay

## 2023-08-13 ENCOUNTER — Other Ambulatory Visit (HOSPITAL_COMMUNITY): Payer: Self-pay

## 2023-08-13 NOTE — Telephone Encounter (Signed)
 Pharmacy Patient Advocate Encounter  Received notification from [facility] that Prior Authorization for NEXIUM  has been APPROVED from 06/24/23 to 03/24/24. Ran test claim, Copay is $0. This test claim was processed through [facility]- copay amounts may vary at other pharmacies due to pharmacy/plan contracts, or as the patient moves through the different stages of their insurance plan.   PA #/Case ID/Reference #: [id number]

## 2023-08-13 NOTE — Telephone Encounter (Signed)
 Per notes in the patient's chart: can only take brand name due to an allergy to the dye in the generic. Patient sent in another message stating she has tried and failed other acid reflux medications on 04/17/23.

## 2023-08-13 NOTE — Telephone Encounter (Signed)
 Clinical questions have been answered and PA submitted. PA currently Pending.

## 2023-08-13 NOTE — Telephone Encounter (Signed)
 Pharmacy Patient Advocate Encounter   Received notification from CoverMyMeds that prior authorization for NEXIUM  is required/requested.   Insurance verification completed.   The patient is insured through [facility] .   Per test claim: PA required; PA started via CoverMyMeds. KEY [id number] . Please see clinical question(s) below that I am not finding the answer to in her chart and advise.      Formulary alternatives: Omeprazole, Pantoprazole , Lansoprazole, Rabeprazole

## 2023-08-15 ENCOUNTER — Other Ambulatory Visit: Payer: Self-pay | Admitting: Physician Assistant

## 2023-08-15 ENCOUNTER — Other Ambulatory Visit: Payer: Self-pay | Admitting: Family Medicine

## 2023-08-15 MED ORDER — ICOSAPENT ETHYL 1 G PO CAPS: 1.0000 g | ORAL_CAPSULE | Freq: Two times a day (BID) | ORAL | 5 refills | Status: DC

## 2023-08-27 DIAGNOSIS — H5213 Myopia, bilateral: Secondary | ICD-10-CM | POA: Diagnosis not present

## 2023-08-27 DIAGNOSIS — H57813 Brow ptosis, bilateral: Secondary | ICD-10-CM | POA: Diagnosis not present

## 2023-08-27 DIAGNOSIS — H33321 Round hole, right eye: Secondary | ICD-10-CM | POA: Diagnosis not present

## 2023-08-27 DIAGNOSIS — Z135 Encounter for screening for eye and ear disorders: Secondary | ICD-10-CM | POA: Diagnosis not present

## 2023-08-27 DIAGNOSIS — E119 Type 2 diabetes mellitus without complications: Secondary | ICD-10-CM | POA: Diagnosis not present

## 2023-08-27 DIAGNOSIS — H524 Presbyopia: Secondary | ICD-10-CM | POA: Diagnosis not present

## 2023-08-28 DIAGNOSIS — M542 Cervicalgia: Secondary | ICD-10-CM | POA: Diagnosis not present

## 2023-08-28 DIAGNOSIS — G894 Chronic pain syndrome: Secondary | ICD-10-CM | POA: Diagnosis not present

## 2023-08-28 DIAGNOSIS — Z79891 Long term (current) use of opiate analgesic: Secondary | ICD-10-CM | POA: Diagnosis not present

## 2023-08-28 DIAGNOSIS — G253 Myoclonus: Secondary | ICD-10-CM | POA: Diagnosis not present

## 2023-08-28 DIAGNOSIS — G43719 Chronic migraine without aura, intractable, without status migrainosus: Secondary | ICD-10-CM | POA: Diagnosis not present

## 2023-08-28 DIAGNOSIS — Z760 Encounter for issue of repeat prescription: Secondary | ICD-10-CM | POA: Diagnosis not present

## 2023-08-28 DIAGNOSIS — Z79899 Other long term (current) drug therapy: Secondary | ICD-10-CM | POA: Diagnosis not present

## 2023-08-28 DIAGNOSIS — G5603 Carpal tunnel syndrome, bilateral upper limbs: Secondary | ICD-10-CM | POA: Diagnosis not present

## 2023-09-01 ENCOUNTER — Encounter: Payer: Self-pay | Admitting: Family Medicine

## 2023-09-01 DIAGNOSIS — Z1211 Encounter for screening for malignant neoplasm of colon: Secondary | ICD-10-CM

## 2023-09-01 MED ORDER — LIDOCAINE 5 % EX PTCH: 3.0000 | MEDICATED_PATCH | CUTANEOUS | 1 refills | Status: DC

## 2023-09-01 NOTE — Telephone Encounter (Signed)
Sent cologuard order. 

## 2023-09-01 NOTE — Telephone Encounter (Signed)
 Requesting lidocaine  patches refill as 90/d  States uses 3 patches per day- requesting qty# 270 .  Last written 06/13/2023 Dr. Greer Leak Last OV 06/27/2023 Dr. Greer Leak Upcoming appt 10/30/2023 Dr. Greer Leak

## 2023-09-02 ENCOUNTER — Other Ambulatory Visit (HOSPITAL_COMMUNITY): Payer: Self-pay

## 2023-09-02 ENCOUNTER — Telehealth: Payer: Self-pay

## 2023-09-02 NOTE — Telephone Encounter (Signed)
 Pharmacy Patient Advocate Encounter   Received notification from Patient Pharmacy that prior authorization for Lidocaine  5% patches is required/requested.   Insurance verification completed.   The patient is insured through [facility] .   Per test claim: PA required; PA submitted to above mentioned insurance via CoverMyMeds Key/confirmation #/EOC [id number] Status is pending

## 2023-09-03 MED ORDER — VASCEPA 1 G PO CAPS
2.0000 g | ORAL_CAPSULE | Freq: Two times a day (BID) | ORAL | 3 refills | Status: DC
Start: 2023-09-03 — End: 2023-09-16

## 2023-09-03 NOTE — Telephone Encounter (Signed)
 Hi [name], so if I am reading this correctly does that mean if I switch her to the brand Vascepa  it should be covered since it is listed under the alternative lists

## 2023-09-03 NOTE — Addendum Note (Signed)
 Addended by: Charday Capetillo D on: 09/03/2023 05:10 PM   Modules accepted: Orders

## 2023-09-04 ENCOUNTER — Other Ambulatory Visit: Payer: Self-pay | Admitting: Family Medicine

## 2023-09-05 NOTE — Telephone Encounter (Signed)
 Pharmacy Patient Advocate Encounter  Received notification from [facility] that Prior Authorization for Lidocaine  5% patches has been DENIED.  See denial reason below. No denial letter attached in CMM. Will attach denial letter to Media tab once received.   PA #/Case ID/Reference #: [id number]

## 2023-09-08 ENCOUNTER — Other Ambulatory Visit (HOSPITAL_COMMUNITY): Payer: Self-pay

## 2023-09-08 NOTE — Telephone Encounter (Signed)
 I called the pharmacy to process the Vascepa  but her pharmacy is out of stock and they informed me it is on backorder. Upon checking, the [facility] at [facility] has it in stock and it can be mailed to her.

## 2023-09-16 ENCOUNTER — Other Ambulatory Visit (HOSPITAL_COMMUNITY): Payer: Self-pay

## 2023-09-16 ENCOUNTER — Other Ambulatory Visit: Payer: Self-pay

## 2023-09-16 MED ORDER — VASCEPA 1 G PO CAPS
2.0000 g | ORAL_CAPSULE | Freq: Two times a day (BID) | ORAL | 1 refills | Status: AC
  Filled 2023-09-16: qty 360, 90d supply, fill #0

## 2023-09-17 ENCOUNTER — Telehealth: Payer: Self-pay | Admitting: Pharmacist

## 2023-09-17 NOTE — Telephone Encounter (Signed)
 Appeal has been submitted for Lidocaine  5% Patches. Will advise when response is received, please be advised that most companies may take 30 days to make a decision. Appeal letter and supporting documentation have been faxed to [phone] on 09/17/2023 @ 10:49am.  Thank you, Devere Pandy, PharmD Clinical Pharmacist  [place]  Direct Dial: [phone]

## 2023-09-18 NOTE — Telephone Encounter (Signed)
 Insurance has approved the appeal for the Lidocaine  patches:     Thank you, Devere Pandy, PharmD Clinical Pharmacist  [place]  Direct Dial: [phone]

## 2023-09-24 ENCOUNTER — Other Ambulatory Visit: Payer: Self-pay

## 2023-09-24 ENCOUNTER — Encounter: Payer: Self-pay | Admitting: Family Medicine

## 2023-09-24 DIAGNOSIS — F331 Major depressive disorder, recurrent, moderate: Secondary | ICD-10-CM

## 2023-09-24 MED ORDER — LIDOCAINE 5 % EX PTCH
3.0000 | MEDICATED_PATCH | CUTANEOUS | 1 refills | Status: DC
Start: 2023-09-24 — End: 2023-12-24

## 2023-09-24 MED ORDER — SERTRALINE HCL 100 MG PO TABS: 200.0000 mg | ORAL_TABLET | Freq: Every day | ORAL | 1 refills | Status: DC

## 2023-09-24 MED ORDER — CYANOCOBALAMIN 1000 MCG/ML IJ SOLN: 1000.0000 ug | INTRAMUSCULAR | 0 refills | Status: AC

## 2023-09-24 MED ORDER — LISINOPRIL 10 MG PO TABS: 10.0000 mg | ORAL_TABLET | Freq: Every day | ORAL | 0 refills | Status: DC

## 2023-09-24 NOTE — Telephone Encounter (Signed)
 I did go ahead and fill her medicines for 90 days so she has a wiggle room to make the appointment.  You to see to scheduled her with me.  That is not appropriate and needs to be scheduled with either [place] or cross the street so she can establish with one of our providers.

## 2023-09-24 NOTE — Telephone Encounter (Signed)
 Patient requesting rx rf  Sertraline  100mg  Last written 04/04/225 dr. judee Lisinopril 10mg  Last written historical provider  B12 injection' Last written 07/24/2023 Dr. Alvan Patient requesting all as 90 day supply Last OV 06/27/2023 Dr. Alvan Upcoming appt 10/30/2023

## 2023-09-25 ENCOUNTER — Telehealth: Payer: Self-pay

## 2023-09-25 DIAGNOSIS — G894 Chronic pain syndrome: Secondary | ICD-10-CM | POA: Diagnosis not present

## 2023-09-25 DIAGNOSIS — M7912 Myalgia of auxiliary muscles, head and neck: Secondary | ICD-10-CM | POA: Diagnosis not present

## 2023-09-25 DIAGNOSIS — Z79891 Long term (current) use of opiate analgesic: Secondary | ICD-10-CM | POA: Diagnosis not present

## 2023-09-25 DIAGNOSIS — G5603 Carpal tunnel syndrome, bilateral upper limbs: Secondary | ICD-10-CM | POA: Diagnosis not present

## 2023-09-25 DIAGNOSIS — Z79899 Other long term (current) drug therapy: Secondary | ICD-10-CM | POA: Diagnosis not present

## 2023-09-25 DIAGNOSIS — G43719 Chronic migraine without aura, intractable, without status migrainosus: Secondary | ICD-10-CM | POA: Diagnosis not present

## 2023-09-25 DIAGNOSIS — G253 Myoclonus: Secondary | ICD-10-CM | POA: Diagnosis not present

## 2023-09-25 NOTE — Telephone Encounter (Signed)
 Pharmacy Patient Advocate Encounter   Received notification from Patient Pharmacy that prior authorization for Cyanocobalamin  1000MCG/ML solution is required/requested.   Insurance verification completed.   The patient is insured through [facility] .    Key: [id number]

## 2023-10-14 DIAGNOSIS — Z1211 Encounter for screening for malignant neoplasm of colon: Secondary | ICD-10-CM | POA: Diagnosis not present

## 2023-10-14 DIAGNOSIS — Z1212 Encounter for screening for malignant neoplasm of rectum: Secondary | ICD-10-CM | POA: Diagnosis not present

## 2023-10-16 DIAGNOSIS — Z79891 Long term (current) use of opiate analgesic: Secondary | ICD-10-CM | POA: Diagnosis not present

## 2023-10-16 DIAGNOSIS — G253 Myoclonus: Secondary | ICD-10-CM | POA: Diagnosis not present

## 2023-10-16 DIAGNOSIS — G894 Chronic pain syndrome: Secondary | ICD-10-CM | POA: Diagnosis not present

## 2023-10-16 DIAGNOSIS — Z79899 Other long term (current) drug therapy: Secondary | ICD-10-CM | POA: Diagnosis not present

## 2023-10-16 DIAGNOSIS — M7912 Myalgia of auxiliary muscles, head and neck: Secondary | ICD-10-CM | POA: Diagnosis not present

## 2023-10-16 DIAGNOSIS — G43719 Chronic migraine without aura, intractable, without status migrainosus: Secondary | ICD-10-CM | POA: Diagnosis not present

## 2023-10-16 DIAGNOSIS — G5603 Carpal tunnel syndrome, bilateral upper limbs: Secondary | ICD-10-CM | POA: Diagnosis not present

## 2023-10-28 ENCOUNTER — Ambulatory Visit: Admitting: Family Medicine

## 2023-10-29 ENCOUNTER — Ambulatory Visit: Payer: Self-pay | Admitting: Family Medicine

## 2023-10-29 LAB — COLOGUARD: COLOGUARD: NEGATIVE

## 2023-10-29 NOTE — Progress Notes (Signed)
 Great news! Your Cologuard test is negative.  Recommend repeat colon cancer screening in 3 years.

## 2023-10-30 ENCOUNTER — Ambulatory Visit: Admitting: Urgent Care

## 2023-10-30 ENCOUNTER — Ambulatory Visit: Admitting: Family Medicine

## 2023-11-11 ENCOUNTER — Ambulatory Visit: Admitting: Urgent Care

## 2023-11-12 ENCOUNTER — Other Ambulatory Visit: Payer: Self-pay

## 2023-11-13 ENCOUNTER — Encounter: Admitting: Urgent Care

## 2023-11-13 DIAGNOSIS — G5603 Carpal tunnel syndrome, bilateral upper limbs: Secondary | ICD-10-CM | POA: Diagnosis not present

## 2023-11-13 DIAGNOSIS — Z79899 Other long term (current) drug therapy: Secondary | ICD-10-CM | POA: Diagnosis not present

## 2023-11-13 DIAGNOSIS — M5417 Radiculopathy, lumbosacral region: Secondary | ICD-10-CM | POA: Diagnosis not present

## 2023-11-13 DIAGNOSIS — G603 Idiopathic progressive neuropathy: Secondary | ICD-10-CM | POA: Diagnosis not present

## 2023-11-13 DIAGNOSIS — M5412 Radiculopathy, cervical region: Secondary | ICD-10-CM | POA: Diagnosis not present

## 2023-11-18 ENCOUNTER — Ambulatory Visit: Admitting: Urgent Care

## 2023-11-18 ENCOUNTER — Encounter: Payer: Self-pay | Admitting: Urgent Care

## 2023-11-18 VITALS — BP 106/75 | HR 86 | Resp 17 | Ht 65.0 in

## 2023-11-18 DIAGNOSIS — Z794 Long term (current) use of insulin: Secondary | ICD-10-CM | POA: Diagnosis not present

## 2023-11-18 DIAGNOSIS — I10 Essential (primary) hypertension: Secondary | ICD-10-CM

## 2023-11-18 DIAGNOSIS — L304 Erythema intertrigo: Secondary | ICD-10-CM | POA: Diagnosis not present

## 2023-11-18 DIAGNOSIS — E1122 Type 2 diabetes mellitus with diabetic chronic kidney disease: Secondary | ICD-10-CM

## 2023-11-18 DIAGNOSIS — R197 Diarrhea, unspecified: Secondary | ICD-10-CM | POA: Diagnosis not present

## 2023-11-18 DIAGNOSIS — E039 Hypothyroidism, unspecified: Secondary | ICD-10-CM | POA: Diagnosis not present

## 2023-11-18 DIAGNOSIS — N1831 Chronic kidney disease, stage 3a: Secondary | ICD-10-CM | POA: Diagnosis not present

## 2023-11-18 DIAGNOSIS — R112 Nausea with vomiting, unspecified: Secondary | ICD-10-CM | POA: Diagnosis not present

## 2023-11-18 DIAGNOSIS — E782 Mixed hyperlipidemia: Secondary | ICD-10-CM

## 2023-11-18 DIAGNOSIS — G43009 Migraine without aura, not intractable, without status migrainosus: Secondary | ICD-10-CM

## 2023-11-18 DIAGNOSIS — F411 Generalized anxiety disorder: Secondary | ICD-10-CM | POA: Diagnosis not present

## 2023-11-18 MED ORDER — QULIPTA 60 MG PO TABS
1.0000 | ORAL_TABLET | Freq: Every day | ORAL | 1 refills | Status: AC
Start: 2023-11-18 — End: ?

## 2023-11-18 MED ORDER — ATORVASTATIN CALCIUM 20 MG PO TABS: 20.0000 mg | ORAL_TABLET | Freq: Every day | ORAL | 1 refills | Status: DC

## 2023-11-18 MED ORDER — TRIAMCINOLONE ACETONIDE 0.1 % EX CREA: 1.0000 | TOPICAL_CREAM | Freq: Two times a day (BID) | CUTANEOUS | 0 refills | Status: AC | PRN

## 2023-11-18 MED ORDER — NYSTATIN 100000 UNIT/GM EX POWD
1.0000 | Freq: Three times a day (TID) | CUTANEOUS | 1 refills | Status: DC
Start: 2023-11-18 — End: 2023-12-04

## 2023-11-18 MED ORDER — LISINOPRIL 2.5 MG PO TABS: 2.5000 mg | ORAL_TABLET | Freq: Every day | ORAL | 1 refills | Status: DC

## 2023-11-18 MED ORDER — PROMETHAZINE HCL 25 MG PO TABS: 25.0000 mg | ORAL_TABLET | Freq: Three times a day (TID) | ORAL | 1 refills | Status: AC | PRN

## 2023-11-18 MED ORDER — ELETRIPTAN HYDROBROMIDE 40 MG PO TABS
ORAL_TABLET | ORAL | 2 refills | Status: DC
Start: 2023-11-18 — End: 2023-12-02
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
## 2023-11-18 NOTE — Patient Instructions (Addendum)
 I have called in your medications as requested.  If the Qulipta  gets approved, take this daily for migraine prevention.  Stop the 10mg  lisinopril  and switch to the 2.5mg  daily. Monitor your blood pressure. Goal is <130/80. See if this lower dose helps prevent your lightheaded sensation upon standing.  Please return in 3 months for annual physical.

## 2023-11-19 ENCOUNTER — Encounter: Payer: Self-pay | Admitting: Urgent Care

## 2023-11-19 ENCOUNTER — Ambulatory Visit: Payer: Self-pay | Admitting: Urgent Care

## 2023-11-19 DIAGNOSIS — I10 Essential (primary) hypertension: Secondary | ICD-10-CM

## 2023-11-19 DIAGNOSIS — I951 Orthostatic hypotension: Secondary | ICD-10-CM

## 2023-11-19 LAB — COMPREHENSIVE METABOLIC PANEL WITH GFR
ALT: 15 IU/L (ref 0–32)
AST: 19 IU/L (ref 0–40)
Albumin: 4.3 g/dL (ref 3.8–4.9)
Alkaline Phosphatase: 120 IU/L (ref 44–121)
BUN/Creatinine Ratio: 51 — ABNORMAL HIGH (ref 9–23)
BUN: 55 mg/dL — ABNORMAL HIGH (ref 6–24)
Bilirubin Total: 0.3 mg/dL (ref 0.0–1.2)
CO2: 16 mmol/L — ABNORMAL LOW (ref 20–29)
Calcium: 9.3 mg/dL (ref 8.7–10.2)
Chloride: 105 mmol/L (ref 96–106)
Creatinine, Ser: 1.08 mg/dL — ABNORMAL HIGH (ref 0.57–1.00)
Globulin, Total: 2.8 g/dL (ref 1.5–4.5)
Glucose: 204 mg/dL — ABNORMAL HIGH (ref 70–99)
Potassium: 5.1 mmol/L (ref 3.5–5.2)
Sodium: 136 mmol/L (ref 134–144)
Total Protein: 7.1 g/dL (ref 6.0–8.5)
eGFR: 61 mL/min/1.73 (ref >59)

## 2023-11-19 LAB — CBC WITH DIFFERENTIAL/PLATELET
Basophils Absolute: 0 x10E3/uL (ref 0.0–0.2)
Basos: 0 %
EOS (ABSOLUTE): 0.3 x10E3/uL (ref 0.0–0.4)
Eos: 3 %
Hematocrit: 42.7 % (ref 34.0–46.6)
Hemoglobin: 13.9 g/dL (ref 11.1–15.9)
Immature Grans (Abs): 0 x10E3/uL (ref 0.0–0.1)
Immature Granulocytes: 0 %
Lymphocytes Absolute: 3.2 x10E3/uL — ABNORMAL HIGH (ref 0.7–3.1)
Lymphs: 30 %
MCH: 30.9 pg (ref 26.6–33.0)
MCHC: 32.6 g/dL (ref 31.5–35.7)
MCV: 95 fL (ref 79–97)
Monocytes Absolute: 0.7 x10E3/uL (ref 0.1–0.9)
Monocytes: 6 %
Neutrophils Absolute: 6.4 x10E3/uL (ref 1.4–7.0)
Neutrophils: 61 %
Platelets: 279 x10E3/uL (ref 150–450)
RBC: 4.5 x10E6/uL (ref 3.77–5.28)
RDW: 12.5 % (ref 11.7–15.4)
WBC: 10.6 x10E3/uL (ref 3.4–10.8)

## 2023-11-19 LAB — LIPID PANEL
Chol/HDL Ratio: 3.2 ratio (ref 0.0–4.4)
Cholesterol, Total: 120 mg/dL (ref 100–199)
HDL: 38 mg/dL — ABNORMAL LOW (ref >39)
LDL Chol Calc (NIH): 60 mg/dL (ref 0–99)
Triglycerides: 121 mg/dL (ref 0–149)
VLDL Cholesterol Cal: 22 mg/dL (ref 5–40)

## 2023-11-19 LAB — TSH: TSH: 2.25 u[IU]/mL (ref 0.450–4.500)

## 2023-11-19 MED ORDER — BUPROPION HCL ER (XL) 150 MG PO TB24: 150.0000 mg | ORAL_TABLET | ORAL | 1 refills | Status: DC

## 2023-11-19 MED ORDER — BUPROPION HCL ER (XL) 300 MG PO TB24: 300.0000 mg | ORAL_TABLET | Freq: Every day | ORAL | 0 refills | Status: DC

## 2023-11-19 MED ORDER — BLOOD PRESSURE MONITOR/ARM DEVI: 0 refills | Status: AC

## 2023-11-19 NOTE — Telephone Encounter (Signed)
 Forwarding message to whitney crain Did the prescriptions sent in yesterday  reflect the strength increases as listed below? And was a prescription for a BP cuff sent over as well?  Message from patient: Hey. Just a reminder when you send in my wellubutrin Dr. Crain was going to increase the dosage on it and also on my nystatin  powder as well. Also reminding she was going to get me a blood pressure cuff through my insurance.  Thanks so much for your help

## 2023-11-21 ENCOUNTER — Other Ambulatory Visit (HOSPITAL_COMMUNITY): Payer: Self-pay

## 2023-11-21 ENCOUNTER — Telehealth: Payer: Self-pay

## 2023-11-21 NOTE — Telephone Encounter (Signed)
 Pharmacy Patient Advocate Encounter   Received notification from Patient Pharmacy that prior authorization for Qulipta  60mg  tabs is required/requested.   Insurance verification completed.   The patient is insured through [facility] .   Per test claim: PA required; PA submitted to above mentioned insurance via Latent Key/confirmation #/EOC [id number] Status is pending

## 2023-11-21 NOTE — Telephone Encounter (Signed)
 Pharmacy Patient Advocate Encounter  Received notification from [facility] that Prior Authorization for Qulipta  60mg  tabs has been APPROVED from 11/21/23 to 02/19/24   PA #/Case ID/Reference #: [id number]

## 2023-11-21 NOTE — Telephone Encounter (Signed)
 Pharmacy Patient Advocate Encounter   Received notification from Patient Pharmacy that prior authorization for Nystatin  Powder is required/requested.   Insurance verification completed.   The patient is insured through [employer] .   Per test claim: Refill too soon. PA is not needed at this time. Medication was filled 11/06/23. Next eligible fill date is 11/29/23.

## 2023-12-02 ENCOUNTER — Encounter: Payer: Self-pay | Admitting: Urgent Care

## 2023-12-02 DIAGNOSIS — L304 Erythema intertrigo: Secondary | ICD-10-CM

## 2023-12-02 DIAGNOSIS — G43009 Migraine without aura, not intractable, without status migrainosus: Secondary | ICD-10-CM

## 2023-12-02 MED ORDER — NEXIUM 40 MG PO CPDR: 40.0000 mg | DELAYED_RELEASE_CAPSULE | Freq: Every day | ORAL | 3 refills | Status: AC

## 2023-12-02 MED ORDER — RELPAX 40 MG PO TABS: 40.0000 mg | ORAL_TABLET | ORAL | 11 refills | Status: DC | PRN

## 2023-12-04 MED ORDER — NYSTATIN 100000 UNIT/GM EX POWD: 1.0000 | Freq: Three times a day (TID) | CUTANEOUS | 1 refills | Status: DC

## 2023-12-04 NOTE — Telephone Encounter (Signed)
 Requesting rx rf of Nystatin  powder  Last written 11/18/2023 Last OV 11/18/2023 Upcoming appt 02/24/2024 physical

## 2023-12-04 NOTE — Addendum Note (Signed)
 Addended by: Jose Alleyne P on: 12/04/2023 08:39 AM   Modules accepted: Orders

## 2023-12-09 ENCOUNTER — Telehealth: Payer: Self-pay

## 2023-12-09 ENCOUNTER — Other Ambulatory Visit (HOSPITAL_COMMUNITY): Payer: Self-pay

## 2023-12-09 ENCOUNTER — Other Ambulatory Visit: Payer: Self-pay

## 2023-12-09 DIAGNOSIS — E782 Mixed hyperlipidemia: Secondary | ICD-10-CM

## 2023-12-09 DIAGNOSIS — L304 Erythema intertrigo: Secondary | ICD-10-CM

## 2023-12-09 MED ORDER — NYSTATIN 100000 UNIT/GM EX POWD: 1.0000 | Freq: Three times a day (TID) | CUTANEOUS | 1 refills | Status: DC

## 2023-12-09 MED ORDER — RELPAX 40 MG PO TABS: 40.0000 mg | ORAL_TABLET | ORAL | 11 refills | Status: AC | PRN

## 2023-12-09 MED ORDER — ATORVASTATIN CALCIUM 20 MG PO TABS: 20.0000 mg | ORAL_TABLET | Freq: Every day | ORAL | 1 refills | Status: DC

## 2023-12-09 NOTE — Telephone Encounter (Signed)
 Pharmacy Patient Advocate Encounter   Received notification from CoverMyMeds that prior authorization for Nystatin  Powder is required/requested.   Insurance verification completed.   The patient is insured through [facility] Medicare Part D.   Per test claim: PA required; PA submitted to above mentioned insurance via Latent Key/confirmation #/EOC [facility] Status is pending

## 2023-12-09 NOTE — Telephone Encounter (Signed)
 Pt notified of approval. Voiced her understanding also inquired about her atorvastatin  and her replax which I resent all three of them again to the pharmacy today. Annabella Rigg, CMA

## 2023-12-09 NOTE — Telephone Encounter (Signed)
 Pharmacy Patient Advocate Encounter  Received notification from [facility] that Prior Authorization for Nystatin  powder has been APPROVED from 12/09/23 to 03/24/24   PA #/Case ID/Reference #: [id number]

## 2023-12-18 ENCOUNTER — Ambulatory Visit: Admitting: Urgent Care

## 2023-12-22 ENCOUNTER — Other Ambulatory Visit: Payer: Self-pay | Admitting: Family Medicine

## 2023-12-24 ENCOUNTER — Ambulatory Visit: Payer: Self-pay

## 2023-12-24 ENCOUNTER — Telehealth: Payer: Self-pay

## 2023-12-24 ENCOUNTER — Telehealth (INDEPENDENT_AMBULATORY_CARE_PROVIDER_SITE_OTHER): Admitting: Family Medicine

## 2023-12-24 ENCOUNTER — Encounter: Payer: Self-pay | Admitting: Family Medicine

## 2023-12-24 ENCOUNTER — Telehealth

## 2023-12-24 VITALS — Temp 99.1°F

## 2023-12-24 DIAGNOSIS — J329 Chronic sinusitis, unspecified: Secondary | ICD-10-CM | POA: Insufficient documentation

## 2023-12-24 DIAGNOSIS — G8929 Other chronic pain: Secondary | ICD-10-CM

## 2023-12-24 DIAGNOSIS — G894 Chronic pain syndrome: Secondary | ICD-10-CM

## 2023-12-24 DIAGNOSIS — J40 Bronchitis, not specified as acute or chronic: Secondary | ICD-10-CM | POA: Diagnosis not present

## 2023-12-24 MED ORDER — PROMETHAZINE-DM 6.25-15 MG/5ML PO SYRP: 5.0000 mL | ORAL_SOLUTION | Freq: Four times a day (QID) | ORAL | 0 refills | Status: AC | PRN

## 2023-12-24 MED ORDER — LIDOCAINE 5 % EX PTCH: 3.0000 | MEDICATED_PATCH | CUTANEOUS | 1 refills | Status: DC

## 2023-12-24 MED ORDER — AMOXICILLIN-POT CLAVULANATE 875-125 MG PO TABS: 1.0000 | ORAL_TABLET | Freq: Two times a day (BID) | ORAL | 0 refills | Status: AC

## 2023-12-24 NOTE — Progress Notes (Signed)
 [name] - 53 y.o. female MRN [id number]  Date of birth: 09/21/1970   All issues noted in this document were discussed and addressed.  No physical exam was performed (except for noted visual exam findings with Video Visits).  I discussed the limitations of evaluation and management by telemedicine and the availability of in person appointments. The patient expressed understanding and agreed to proceed.  I connected withNAME@ on 12/24/23 at  2:50 PM EDT by a video enabled telemedicine application and verified that I am speaking with the correct person using two identifiers.  Present at visit: Velma Ku, DO [name]   Patient Location: Home [address] [place] [address]   Provider location:   [facility]  Chief Complaint  Patient presents with   Cough    Congestion, sore throat    HPI  [name] is a 53 y.o. female who presents via audio/video conferencing for a telehealth visit today.  She reports 5 day history of sinus pain and pressure, chest congestion with productive cough, wheezing, fatigue and fever.  Cough is productive of thick, yellow mucus. She has tried delsym and mucinex without much relief.  She has multiple antibiotic allergies and does not do well with steroids  due to this elevating her blood sugars.    ROS:  A comprehensive ROS was completed and negative except as noted per HPI  Past Medical History:  Diagnosis Date   Acute renal failure (ARF) 09/02/2012   Anemia    Anxiety    panic attacks   Back pain    Blood transfusion    Chronic heel ulcer (HCC)    Chronic kidney disease    Chronic pain syndrome    Depression    Diabetes mellitus    type II    DJD (degenerative joint disease)    Dysrhythmia    pt unsure what this was   Fibromyalgia    GERD (gastroesophageal reflux disease)    H/O: Bell's palsy 2011   Heart murmur    slight one   History of 2019 novel coronavirus disease (COVID-19) 03/25/2019   History of kidney  stones    History of MRSA infection OF ULCER   Hypertension    no longer taken since 10-11 months per patient at preop phone call of 10/13/2017    Hypothyroidism    Hypoventilation associated with obesity syndrome (HCC)    Migraine    Non-healing non-surgical wound    Right hip, has Wound vac to hip.  Started as a skin tear.   Peripheral vascular disease    PONV (postoperative nausea and vomiting)    can be slow to wake up after surgery   Postoperative abscess 10/27/2017   Right foot drop    Shortness of breath    with Activity   Sleep apnea    study shows not bad enough for CPAP., pt denies   Umbilical hernia     Past Surgical History:  Procedure Laterality Date   BACK SURGERY     for lumbar disc disease X2   BRAIN SURGERY  1970   Tumor removed- has steel plate   BRAIN SURGERY      Plating due to soft spot closing too early- age 45   BREAST LUMPECTOMY WITH NEEDLE LOCALIZATION Right 08/23/2013   Procedure: RIGHT BREAST LUMPECTOMY WITH NEEDLE LOCALIZATION;  Surgeon: Krystal JINNY Russell, MD;  Location: [facility] OR;  Service: General;  Laterality: Right;   CHOLECYSTECTOMY  1984  EXCISION UMBILICAL NODULE N/A 10/16/2017   Procedure: EXCISION OF UMBILICUS;  Surgeon: Vernetta Berg, MD;  Location: [facility] ORS;  Service: General;  Laterality: N/A;   EYE SURGERY     eye lift   I & D EXTREMITY  04/02/2011   Procedure: IRRIGATION AND DEBRIDEMENT EXTREMITY;  Surgeon: Lonni CINDERELLA Vernetta;  Location: [facility] OR;  Service: Orthopedics;  Laterality: Right;  I&D right heel ulcer, placement of A-cell graft   INCISION AND DRAINAGE ABSCESS N/A 10/28/2017   Procedure: INCISION AND DRAINAGE UMBILICAL HERNIA ABSCESS;  Surgeon: Rubin Calamity, MD;  Location: [facility] OR;  Service: General;  Laterality: N/A;   INCISION AND DRAINAGE OF WOUND  08/29/2011   Procedure: IRRIGATION AND DEBRIDEMENT WOUND;  Surgeon: Estefana Reichert, DO;  Location: [facility] OR;  Service: Plastics;  Laterality: Right;   INSERTION OF MESH N/A 10/16/2017    Procedure: INSERTION OF MESH;  Surgeon: Vernetta Berg, MD;  Location: [facility] ORS;  Service: General;  Laterality: N/A;   LITHOTRIPSY     2007ish   right elbow     UMBILICAL HERNIA REPAIR N/A 10/16/2017   Procedure: UMBILICAL HERNIA REPAIR WITH MESH;  Surgeon: Vernetta Berg, MD;  Location: [facility] ORS;  Service: General;  Laterality: N/A;    Family History  Problem Relation Age of Onset   Diabetes type II Father    Heart attack Father    Peripheral vascular disease Father    Diabetes type II Mother    Anesthesia problems Mother    Heart attack Mother    Peripheral vascular disease Mother    Hypertension Mother     Social History   Socioeconomic History   Marital status: Single    Spouse name: Not on file   Number of children: Not on file   Years of education: Not on file   Highest education level: Not on file  Occupational History   Not on file  Tobacco Use   Smoking status: Never   Smokeless tobacco: Never  Vaping Use   Vaping status: Never Used  Substance and Sexual Activity   Alcohol use: No   Drug use: No   Sexual activity: Never  Other Topics Concern   Not on file  Social History Narrative   Not on file   Social Drivers of Health   Financial Resource Strain: Not on file  Food Insecurity: Medium Risk (08/29/2022)   Received from [facility]   Hunger Vital Sign    Worried About Running Out of Food in the Last Year: Sometimes true    Ran Out of Food in the Last Year: Sometimes true  Transportation Needs: Not on file (08/29/2022)  Recent Concern: Transportation Needs - Unmet Transportation Needs (08/29/2022)   Received from [facility]    In the past 12 months, has lack of reliable transportation kept you from medical appointments, meetings, work or from getting things needed for daily living? : Yes  Physical Activity: Not on file  Stress: Not on file  Social Connections: Unknown (07/24/2022)   Received from [facility]   Social Network     Social Network: Not on file  Intimate Partner Violence: Unknown (07/24/2022)   Received from [facility]   HITS    Physically Hurt: Not on file    Insult or Talk Down To: Not on file    Threaten Physical Harm: Not on file    Scream or Curse: Not on file     Current Outpatient Medications:    amoxicillin -clavulanate (  AUGMENTIN ) 875-125 MG tablet, Take 1 tablet by mouth 2 (two) times daily., Disp: 20 tablet, Rfl: 0   promethazine -dextromethorphan (PROMETHAZINE -DM) 6.25-15 MG/5ML syrup, Take 5 mLs by mouth 4 (four) times daily as needed for cough (Maximum dose: 30mL in 24 hours)., Disp: 118 mL, Rfl: 0   acetaminophen  (TYLENOL ) 500 MG tablet, Take 500 mg by mouth every 6 (six) hours as needed for moderate pain., Disp: , Rfl:    ASPIRIN  LOW DOSE 81 MG EC tablet, Take 81 mg by mouth daily., Disp: , Rfl:    Atogepant  (QULIPTA ) 60 MG TABS, Take 1 tablet (60 mg total) by mouth daily., Disp: 90 tablet, Rfl: 1   atorvastatin  (LIPITOR) 20 MG tablet, Take 1 tablet (20 mg total) by mouth daily., Disp: 90 tablet, Rfl: 1   BD PEN NEEDLE NANO ULTRAFINE 32G X 4 MM MISC, Use as directed with Tresiba FlexTouch and Humalog KwikPen. Total 4 injections daily., Disp: , Rfl:    Biotin  10000 MCG TABS, Take 10,000 mcg by mouth daily., Disp: , Rfl:    Blood Pressure Monitoring (BLOOD PRESSURE MONITOR/ARM) DEVI, One BP monitor, any brand, for daily BP monitoring, Disp: 1 each, Rfl: 0   buPROPion  (WELLBUTRIN  XL) 150 MG 24 hr tablet, Take 1 tablet (150 mg total) by mouth every morning., Disp: 90 tablet, Rfl: 1   buPROPion  (WELLBUTRIN  XL) 300 MG 24 hr tablet, Take 1 tablet (300 mg total) by mouth daily., Disp: 90 tablet, Rfl: 0   busPIRone  (BUSPAR ) 10 MG tablet, Take 10 mg by mouth 2 (two) times daily., Disp: , Rfl:    carisoprodol  (SOMA ) 350 MG tablet, Take 1 tablet (350 mg total) by mouth 3 (three) times daily as needed for muscle spasms., Disp: 30 tablet, Rfl: 0   cetirizine  (ZYRTEC ) 10 MG tablet, Take 1 tablet (10 mg  total) by mouth daily., Disp: 30 tablet, Rfl: 2   Cholecalciferol  20 MCG (800 UNIT) TABS, Take 1 tablet by mouth daily., Disp: 90 tablet, Rfl: 2   Continuous Glucose Sensor (DEXCOM G7 SENSOR) MISC, as directed., Disp: , Rfl:    cyanocobalamin  (VITAMIN B12) 1000 MCG/ML injection, Inject 1 mL (1,000 mcg total) into the muscle every 30 (thirty) days., Disp: 3 mL, Rfl: 0   fentaNYL  (DURAGESIC ) 50 MCG/HR, Place 1 patch onto the skin every 3 (three) days., Disp: 5 patch, Rfl: 0   gabapentin  (NEURONTIN ) 600 MG tablet, Take 0.5 tablets (300 mg total) by mouth at bedtime. (Patient taking differently: Take 600 mg by mouth 3 (three) times daily.), Disp: , Rfl:    hydrOXYzine  (ATARAX ) 25 MG tablet, Take 25-50 mg by mouth daily., Disp: , Rfl:    insulin  aspart protamine - aspart (NOVOLOG  MIX 70/30 FLEXPEN) (70-30) 100 UNIT/ML FlexPen, Inject 60 Units into the skin 3 (three) times daily after meals., Disp: 54 mL, Rfl: 5   levETIRAcetam  (KEPPRA ) 750 MG tablet, Take 750 mg by mouth in the morning, at noon, and at bedtime., Disp: , Rfl:    levothyroxine  (SYNTHROID ) 150 MCG tablet, TAKE ONE TABLET BY MOUTH IN THE MORNING, Disp: 90 tablet, Rfl: 0   lidocaine  (LIDODERM ) 5 %, Place 3 patches onto the skin daily. Remove & Discard patch within 12 hours or as directed by MD, Disp: 270 patch, Rfl: 1   lisinopril  (ZESTRIL ) 2.5 MG tablet, Take 1 tablet (2.5 mg total) by mouth daily., Disp: 90 tablet, Rfl: 1   meclizine  (ANTIVERT ) 25 MG tablet, Take 1 tablet (25 mg total) by mouth 3 (three) times daily  as needed for dizziness., Disp: 30 tablet, Rfl: 0   NEXIUM  40 MG capsule, Take 1 capsule (40 mg total) by mouth daily., Disp: 90 capsule, Rfl: 3   NOVOLOG  FLEXPEN 100 UNIT/ML FlexPen, Inject 20 units into the skin before breakfast, 10 units before lunch, and 20 units before dinner. (Total daily dose = 50 units.), Disp: , Rfl:    nystatin  (MYCOSTATIN /NYSTOP ) powder, Apply 1 Application topically 3 (three) times daily., Disp: 270 g,  Rfl: 1   Oxycodone  HCl 10 MG TABS, Take 1 tablet (10 mg total) by mouth every 6 (six) hours as needed (for pain)., Disp: 1 tablet, Rfl: 0   Prenatal Vit-Fe Fumarate-FA (WESTAB PLUS) 27-1 MG TABS, Take 1 tablet by mouth daily., Disp: , Rfl:    promethazine  (PHENERGAN ) 25 MG tablet, Take 1 tablet (25 mg total) by mouth every 8 (eight) hours as needed for nausea or vomiting., Disp: 15 tablet, Rfl: 1   RELPAX  40 MG tablet, Take 1 tablet (40 mg total) by mouth as needed for migraine or headache. May repeat in 2 hours if headache persists or recurs., Disp: 16 tablet, Rfl: 11   sertraline  (ZOLOFT ) 100 MG tablet, Take 2 tablets (200 mg total) by mouth at bedtime., Disp: 180 tablet, Rfl: 1   tirzepatide (MOUNJARO) 7.5 MG/0.5ML Pen, Inject 7.5 mg into the skin once a week., Disp: , Rfl:    triamcinolone  cream (KENALOG ) 0.1 %, Apply 1 Application topically 2 (two) times daily as needed (skin rash)., Disp: 30 g, Rfl: 0   VASCEPA  1 g capsule, Take 2 capsules (2 g total) by mouth 2 (two) times daily., Disp: 360 capsule, Rfl: 1  EXAM:  VITALS per patient if applicable: Temp 99.1 F (37.3 C)   GENERAL: alert, oriented, appears well and in no acute distress  HEENT: atraumatic, conjunttiva clear, no obvious abnormalities on inspection of external nose and ears  NECK: normal movements of the head and neck  LUNGS: on inspection no signs of respiratory distress, breathing rate appears normal, no obvious gross SOB, gasping or wheezing  CV: no obvious cyanosis  MS: moves all visible extremities without noticeable abnormality  PSYCH/NEURO: pleasant and cooperative, no obvious depression or anxiety, speech and thought processing grossly intact  ASSESSMENT AND PLAN:  Discussed the following assessment and plan:  Sinobronchitis Reviewed list of allergens.  Recommended doxycycline  but she says she can not tolerate this.  She can take amoxicillin  despite PCN allergy and has done well with this in the past.   Will start augmentin .  Recommend supportive care with increased fluids as well.  Promethazine  DM as needed for cough.  Red flags reviewed.       I discussed the assessment and treatment plan with the patient. The patient was provided an opportunity to ask questions and all were answered. The patient agreed with the plan and demonstrated an understanding of the instructions.   The patient was advised to call back or seek an in-person evaluation if the symptoms worsen or if the condition fails to improve as anticipated.    Velma Ku, DO

## 2023-12-24 NOTE — Telephone Encounter (Signed)
 FYI Only or Action Required?: FYI only for provider.  Patient was last seen in primary care on 11/18/2023 by Lowella Benton CROME, PA.  Called Nurse Triage reporting Nasal Congestion.  Symptoms began several days ago.  Interventions attempted: OTC medications: Tylenol , Mucinex, Prescription medications: Relpex, and Rest, hydration, or home remedies.  Symptoms are: gradually worsening.  Triage Disposition: See Physician Within 24 Hours  Patient/caregiver understands and will follow disposition?: Yes  Copied from CRM #[id number]. Topic: Clinical - Red Word Triage >> Dec 24, 2023 10:23 AM Sophia H wrote: Red Word that prompted transfer to Nurse Triage: Patient states she has congestion, cough, head feels stuffy, yellow/green phlegm, bad headache & sore throat. Has been going on for 3 days and OTC meds are not helping. Req virtual visit Reason for Disposition  [1] Sinus pain (not just congestion) AND [2] fever  Answer Assessment - Initial Assessment Questions About 4 days ago she thinks it started as a sinus infection and her nasal congestion is irritating her throat. She complains cough, congestion, sore throat, headache, and mouth breathing. She had one episode of 101.3 fever that broke with Tylenol , She has taken Mucinex with minimal improvement, Relpax  has not helped her headache. Saline Nasal wash with no improvement. Virtual UC scheduled.   1. ONSET: When did the nasal discharge start?       4 days ago 2. AMOUNT: How much discharge is there?      Phlegm not always coming up  3. COUGH: Do you have a cough? If Yes, ask: Describe the color of your mucus. (e.g., clear, white, yellow, green)     Yes- thick yellow green  4. RESPIRATORY DISTRESS: Describe your breathing.      Mouth breathing 5. FEVER: Do you have a fever? If Yes, ask: What is your temperature, how was it measured, and when did it start?     yesterday 6. SEVERITY: Overall, how bad are you feeling right now? (e.g.,  doesn't interfere with normal activities, staying home from school/work, staying in bed)      Laid up in bed for 3-4 days 7. OTHER SYMPTOMS: Do you have any other symptoms? (e.g., earache, mouth sores, sore throat, wheezing)     Congestion, cough, head congestion, headache, sore throat  Protocols used: Common Cold-A-AH

## 2023-12-24 NOTE — Progress Notes (Signed)
 Fever 101.3 yesterday. COVID test negative. Symptoms started 5-6 days ago with sinus congestion. Delsym for cough and mucinex-dm and tylenol . Mucus is thick and yellow-green. Sinus pressure not relieved with migraine meds.

## 2023-12-24 NOTE — Telephone Encounter (Signed)
 This task has been completed as requested. Please review other TE for additional information. Patient notified of the update. No other inquiries during the call. No further action needed.

## 2023-12-24 NOTE — Assessment & Plan Note (Signed)
 Reviewed list of allergens.  Recommended doxycycline  but she says she can not tolerate this.  She can take amoxicillin  despite PCN allergy and has done well with this in the past.  Will start augmentin .  Recommend supportive care with increased fluids as well.  Promethazine  DM as needed for cough.  Red flags reviewed.

## 2023-12-24 NOTE — Telephone Encounter (Signed)
 Copied from CRM #[id number]. Topic: Clinical - Medication Question >> Dec 24, 2023 12:09 PM Delon DASEN wrote:  Reason for CRM: lidocaine  (LIDODERM ) 5 %- still waiting for refill- almost at 3 day for request

## 2023-12-24 NOTE — Telephone Encounter (Signed)
 Patient had video visit today with Dr. Alvia

## 2023-12-30 ENCOUNTER — Encounter: Payer: Self-pay | Admitting: Urgent Care

## 2023-12-30 ENCOUNTER — Other Ambulatory Visit: Payer: Self-pay | Admitting: Urgent Care

## 2023-12-30 DIAGNOSIS — I10 Essential (primary) hypertension: Secondary | ICD-10-CM

## 2023-12-30 DIAGNOSIS — F331 Major depressive disorder, recurrent, moderate: Secondary | ICD-10-CM

## 2023-12-31 MED ORDER — LEVOTHYROXINE SODIUM 150 MCG PO TABS: 150.0000 ug | ORAL_TABLET | Freq: Every morning | ORAL | 0 refills | Status: DC

## 2023-12-31 MED ORDER — LISINOPRIL 5 MG PO TABS: 5.0000 mg | ORAL_TABLET | Freq: Every day | ORAL | 1 refills | Status: DC

## 2023-12-31 MED ORDER — SERTRALINE HCL 100 MG PO TABS: 200.0000 mg | ORAL_TABLET | Freq: Every day | ORAL | 0 refills | Status: DC

## 2023-12-31 MED ORDER — SERTRALINE HCL 100 MG PO TABS: 200.0000 mg | ORAL_TABLET | Freq: Every day | ORAL | 1 refills | Status: DC

## 2023-12-31 MED ORDER — LISINOPRIL 5 MG PO TABS: 5.0000 mg | ORAL_TABLET | Freq: Every day | ORAL | 0 refills | Status: DC

## 2023-12-31 MED ORDER — LEVOTHYROXINE SODIUM 150 MCG PO TABS: 150.0000 ug | ORAL_TABLET | Freq: Every morning | ORAL | 1 refills | Status: DC

## 2023-12-31 NOTE — Telephone Encounter (Signed)
 Cancelled Lisinopril  2.5mg  at [facility]  Pended Lisinopril  5mg   to send   Patient also requesting a #15 day supply to [facility] in [place] for all three meds  O.k. to send these ?

## 2023-12-31 NOTE — Telephone Encounter (Signed)
 Requesting rx rf of  Levothyroxine  150 Last written 09/05/2023 Lisinopril  2.5 Last written 11/18/2023 Sertraline  100mg   Last written 09/24/2023  Requesting 90 day supply to [facility]  And requesting 15 day supply to [facility].   In chart notes reads takes Lisinopril  10mg   and  ( Showing in chart as 2.5mg  )  Please review this before refill  O.k. to send as 90 days for all to [facility] and also [facility]??

## 2023-12-31 NOTE — Telephone Encounter (Signed)
 Patient informed of strength change of Lisinopril  and that 15 day supply available at local pharmacy.

## 2023-12-31 NOTE — Addendum Note (Signed)
 Addended by: Zhana Jeangilles P on: 12/31/2023 04:49 PM   Modules accepted: Orders

## 2023-12-31 NOTE — Telephone Encounter (Signed)
 Yes please send 15 day supply locally. Thank you for correcting the lisinopril  mg. :)

## 2024-01-01 DIAGNOSIS — Z79891 Long term (current) use of opiate analgesic: Secondary | ICD-10-CM | POA: Diagnosis not present

## 2024-01-01 DIAGNOSIS — G253 Myoclonus: Secondary | ICD-10-CM | POA: Diagnosis not present

## 2024-01-01 DIAGNOSIS — G43719 Chronic migraine without aura, intractable, without status migrainosus: Secondary | ICD-10-CM | POA: Diagnosis not present

## 2024-01-01 DIAGNOSIS — G5603 Carpal tunnel syndrome, bilateral upper limbs: Secondary | ICD-10-CM | POA: Diagnosis not present

## 2024-01-01 DIAGNOSIS — G894 Chronic pain syndrome: Secondary | ICD-10-CM | POA: Diagnosis not present

## 2024-01-01 DIAGNOSIS — M7912 Myalgia of auxiliary muscles, head and neck: Secondary | ICD-10-CM | POA: Diagnosis not present

## 2024-01-01 DIAGNOSIS — Z79899 Other long term (current) drug therapy: Secondary | ICD-10-CM | POA: Diagnosis not present

## 2024-01-01 DIAGNOSIS — Z760 Encounter for issue of repeat prescription: Secondary | ICD-10-CM | POA: Diagnosis not present

## 2024-01-02 ENCOUNTER — Other Ambulatory Visit: Payer: Self-pay | Admitting: Family Medicine

## 2024-01-02 DIAGNOSIS — E782 Mixed hyperlipidemia: Secondary | ICD-10-CM

## 2024-01-02 MED ORDER — ATORVASTATIN CALCIUM 20 MG PO TABS: 20.0000 mg | ORAL_TABLET | Freq: Every day | ORAL | 0 refills | Status: AC

## 2024-01-07 ENCOUNTER — Emergency Department (HOSPITAL_COMMUNITY)

## 2024-01-07 ENCOUNTER — Encounter (HOSPITAL_COMMUNITY): Payer: Self-pay

## 2024-01-07 ENCOUNTER — Emergency Department (HOSPITAL_COMMUNITY)
Admission: EM | Admit: 2024-01-07 | Discharge: 2024-01-07 | Disposition: A | Discharge status: Home / Self Care | Attending: Emergency Medicine | Admitting: Emergency Medicine

## 2024-01-07 DIAGNOSIS — E039 Hypothyroidism, unspecified: Secondary | ICD-10-CM | POA: Diagnosis not present

## 2024-01-07 DIAGNOSIS — M25562 Pain in left knee: Secondary | ICD-10-CM | POA: Diagnosis not present

## 2024-01-07 DIAGNOSIS — Z79899 Other long term (current) drug therapy: Secondary | ICD-10-CM | POA: Diagnosis not present

## 2024-01-07 DIAGNOSIS — M25462 Effusion, left knee: Secondary | ICD-10-CM | POA: Diagnosis not present

## 2024-01-07 DIAGNOSIS — T40601A Poisoning by unspecified narcotics, accidental (unintentional), initial encounter: Secondary | ICD-10-CM | POA: Diagnosis not present

## 2024-01-07 DIAGNOSIS — Z7982 Long term (current) use of aspirin: Secondary | ICD-10-CM | POA: Insufficient documentation

## 2024-01-07 DIAGNOSIS — I10 Essential (primary) hypertension: Secondary | ICD-10-CM | POA: Diagnosis not present

## 2024-01-07 DIAGNOSIS — Z043 Encounter for examination and observation following other accident: Secondary | ICD-10-CM | POA: Diagnosis not present

## 2024-01-07 DIAGNOSIS — R457 State of emotional shock and stress, unspecified: Secondary | ICD-10-CM | POA: Diagnosis not present

## 2024-01-07 DIAGNOSIS — T887XXA Unspecified adverse effect of drug or medicament, initial encounter: Secondary | ICD-10-CM | POA: Diagnosis not present

## 2024-01-07 DIAGNOSIS — E1165 Type 2 diabetes mellitus with hyperglycemia: Secondary | ICD-10-CM | POA: Diagnosis not present

## 2024-01-07 DIAGNOSIS — M1712 Unilateral primary osteoarthritis, left knee: Secondary | ICD-10-CM | POA: Diagnosis not present

## 2024-01-07 DIAGNOSIS — T50904A Poisoning by unspecified drugs, medicaments and biological substances, undetermined, initial encounter: Secondary | ICD-10-CM | POA: Diagnosis not present

## 2024-01-07 DIAGNOSIS — T402X1A Poisoning by other opioids, accidental (unintentional), initial encounter: Secondary | ICD-10-CM | POA: Diagnosis not present

## 2024-01-07 DIAGNOSIS — I491 Atrial premature depolarization: Secondary | ICD-10-CM | POA: Diagnosis not present

## 2024-01-07 DIAGNOSIS — Z7984 Long term (current) use of oral hypoglycemic drugs: Secondary | ICD-10-CM | POA: Insufficient documentation

## 2024-01-07 LAB — RAPID URINE DRUG SCREEN, HOSP PERFORMED
Amphetamines: NOT DETECTED
Barbiturates: NOT DETECTED
Benzodiazepines: NOT DETECTED
Cocaine: NOT DETECTED
Opiates: POSITIVE — AB
Tetrahydrocannabinol: NOT DETECTED

## 2024-01-07 LAB — COMPREHENSIVE METABOLIC PANEL WITH GFR
ALT: 22 U/L (ref 0–44)
AST: 30 U/L (ref 15–41)
Albumin: 3.1 g/dL — ABNORMAL LOW (ref 3.5–5.0)
Alkaline Phosphatase: 90 U/L (ref 38–126)
Anion gap: 9 (ref 5–15)
BUN: 41 mg/dL — ABNORMAL HIGH (ref 6–20)
CO2: 17 mmol/L — ABNORMAL LOW (ref 22–32)
Calcium: 8.4 mg/dL — ABNORMAL LOW (ref 8.9–10.3)
Chloride: 108 mmol/L (ref 98–111)
Creatinine, Ser: 0.97 mg/dL (ref 0.44–1.00)
GFR, Estimated: >60 mL/min (ref >60)
Glucose, Bld: 82 mg/dL (ref 70–99)
Potassium: 3.7 mmol/L (ref 3.5–5.1)
Sodium: 134 mmol/L — ABNORMAL LOW (ref 135–145)
Total Bilirubin: 0.2 mg/dL (ref 0.0–1.2)
Total Protein: 6.7 g/dL (ref 6.5–8.1)

## 2024-01-07 LAB — CBC WITH DIFFERENTIAL/PLATELET
Abs Immature Granulocytes: 0.03 K/uL (ref 0.00–0.07)
Basophils Absolute: 0 K/uL (ref 0.0–0.1)
Basophils Relative: 0 %
Eosinophils Absolute: 0.4 K/uL (ref 0.0–0.5)
Eosinophils Relative: 3 %
HCT: 41.5 % (ref 36.0–46.0)
Hemoglobin: 13.5 g/dL (ref 12.0–15.0)
Immature Granulocytes: 0 %
Lymphocytes Relative: 29 %
Lymphs Abs: 3.2 K/uL (ref 0.7–4.0)
MCH: 29.7 pg (ref 26.0–34.0)
MCHC: 32.5 g/dL (ref 30.0–36.0)
MCV: 91.4 fL (ref 80.0–100.0)
Monocytes Absolute: 0.8 K/uL (ref 0.1–1.0)
Monocytes Relative: 7 %
Neutro Abs: 6.5 K/uL (ref 1.7–7.7)
Neutrophils Relative %: 61 %
Platelets: 232 K/uL (ref 150–400)
RBC: 4.54 MIL/uL (ref 3.87–5.11)
RDW: 12.4 % (ref 11.5–15.5)
WBC: 10.9 K/uL — ABNORMAL HIGH (ref 4.0–10.5)
nRBC: 0 % (ref 0.0–0.2)

## 2024-01-07 LAB — ETHANOL: Alcohol, Ethyl (B): <15 mg/dL (ref <15)

## 2024-01-07 LAB — CBG MONITORING, ED: Glucose-Capillary: 126 mg/dL — ABNORMAL HIGH (ref 70–99)

## 2024-01-07 LAB — SALICYLATE LEVEL: Salicylate Lvl: <7 mg/dL — ABNORMAL LOW (ref 7.0–30.0)

## 2024-01-07 LAB — ACETAMINOPHEN LEVEL: Acetaminophen (Tylenol), Serum: <10 ug/mL — ABNORMAL LOW (ref 10–30)

## 2024-01-07 MED ORDER — ACETAMINOPHEN 500 MG PO TABS
1000.0000 mg | ORAL_TABLET | Freq: Once | ORAL | Status: AC
  Administered 2024-01-07: 1000 mg via ORAL
  Filled 2024-01-07: qty 2

## 2024-01-07 MED ORDER — SODIUM CHLORIDE 0.9 % IV BOLUS
500.0000 mL | Freq: Once | INTRAVENOUS | Status: AC
  Administered 2024-01-07: 500 mL via INTRAVENOUS

## 2024-01-07 MED ORDER — SODIUM CHLORIDE 0.9 % IV BOLUS
1000.0000 mL | Freq: Once | INTRAVENOUS | Status: AC
  Administered 2024-01-07: 1000 mL via INTRAVENOUS

## 2024-01-07 NOTE — ED Notes (Signed)
 Poison control closed case, EDP aware

## 2024-01-07 NOTE — ED Notes (Signed)
Pt attempting to use bedpan to provide urine sample.

## 2024-01-07 NOTE — ED Notes (Signed)
 Pt has been on RA x 45 minutes. Sats are staying in high 90s.

## 2024-01-07 NOTE — ED Triage Notes (Signed)
 Pt reports accidentally taking approx. 88 10mg  oxycodone  around 0900 this am. Pt drowsy, but alert and oriented x 4.

## 2024-01-07 NOTE — Discharge Instructions (Addendum)
 Would recommend following up with your orthopedist regarding the knee but it appears that they are planning to do knee replacements in both legs.  You have been cleared by poison control for discharge home.  Follow-up with your doctors.

## 2024-01-07 NOTE — ED Notes (Signed)
 Pt drowsy, drinking fluids and eating sandwich at present

## 2024-01-07 NOTE — ED Notes (Signed)
 Call from lab at 1202 advising that green top tube had hemolyzed. Drew and resent new tube

## 2024-01-07 NOTE — ED Provider Notes (Signed)
 [place] EMERGENCY DEPARTMENT AT [facility] Provider Note   CSN: [id number] Arrival date & time: 01/07/24  1007     Patient presents with: Drug Overdose   [name] is a 53 y.o. female.   Pt is a 53 yo female with pmhx significant for chronic pain (oxycodone  10s), obesity, dm, hypothyroidism, depression, fibromyalgia, right foot drop secondary to back injury, hld, and migraines.  Pt said she accidentally took 88 oxycodone  10 pills around 0900.  She said her usual morning pills were right next to her oxycodone  pill bottle and she grabbed the wrong bottle.  She denies si.  She feels a little sleepy, but is otherwise ok.  Poison Control was called by the charge nurse.  Note is in chart.       Prior to Admission medications   Medication Sig Start Date End Date Taking? Authorizing Provider  acetaminophen  (TYLENOL ) 500 MG tablet Take 500 mg by mouth every 6 (six) hours as needed for moderate pain.    [provider]  amoxicillin -clavulanate (AUGMENTIN ) 875-125 MG tablet Take 1 tablet by mouth 2 (two) times daily. 12/24/23   Alvia Bring, DO  ASPIRIN  LOW DOSE 81 MG EC tablet Take 81 mg by mouth daily. 01/19/21   [provider]  Atogepant  (QULIPTA ) 60 MG TABS Take 1 tablet (60 mg total) by mouth daily. 11/18/23   Crain, Whitney L, PA  atorvastatin  (LIPITOR) 20 MG tablet Take 1 tablet (20 mg total) by mouth daily. 01/02/24   Crain, Whitney L, PA  BD PEN NEEDLE NANO ULTRAFINE 32G X 4 MM MISC Use as directed with Tresiba FlexTouch and Humalog KwikPen. Total 4 injections daily. 10/11/23   [provider]  Biotin  10000 MCG TABS Take 10,000 mcg by mouth daily.    [provider]  Blood Pressure Monitoring (BLOOD PRESSURE MONITOR/ARM) DEVI One BP monitor, any brand, for daily BP monitoring 11/19/23   Crain, Whitney L, PA  buPROPion  (WELLBUTRIN  XL) 150 MG 24 hr tablet Take 1 tablet (150 mg total) by mouth every morning. 11/19/23   Crain, Whitney L, PA   buPROPion  (WELLBUTRIN  XL) 300 MG 24 hr tablet Take 1 tablet (300 mg total) by mouth daily. 11/19/23   Crain, Whitney L, PA  busPIRone  (BUSPAR ) 10 MG tablet Take 10 mg by mouth 2 (two) times daily.    [provider]  carisoprodol  (SOMA ) 350 MG tablet Take 1 tablet (350 mg total) by mouth 3 (three) times daily as needed for muscle spasms. 04/11/23   Bevin Bernice RAMAN, DO  cetirizine  (ZYRTEC ) 10 MG tablet Take 1 tablet (10 mg total) by mouth daily. 06/13/23   Bevin Bernice RAMAN, DO  Cholecalciferol  20 MCG (800 UNIT) TABS Take 1 tablet by mouth daily. 02/27/23   Bevin Bernice RAMAN, DO  Continuous Glucose Sensor (DEXCOM G7 SENSOR) MISC as directed.    [provider]  cyanocobalamin  (VITAMIN B12) 1000 MCG/ML injection Inject 1 mL (1,000 mcg total) into the muscle every 30 (thirty) days. 09/24/23   Alvan Dorothyann BIRCH, MD  fentaNYL  (DURAGESIC ) 50 MCG/HR Place 1 patch onto the skin every 3 (three) days. 04/04/20   Mikhail, Maryann, DO  gabapentin  (NEURONTIN ) 600 MG tablet Take 0.5 tablets (300 mg total) by mouth at bedtime. Patient taking differently: Take 600 mg by mouth 3 (three) times daily. 09/26/21   Macario Dorothyann HERO, MD  hydrOXYzine  (ATARAX ) 25 MG tablet Take 25-50 mg by mouth daily.    [provider]  insulin  aspart  protamine - aspart (NOVOLOG  MIX 70/30 FLEXPEN) (70-30) 100 UNIT/ML FlexPen Inject 60 Units into the skin 3 (three) times daily after meals. 02/27/23   Bevin Bernice RAMAN, DO  levETIRAcetam  (KEPPRA ) 750 MG tablet Take 750 mg by mouth in the morning, at noon, and at bedtime. 03/04/19   [provider]  levothyroxine  (SYNTHROID ) 150 MCG tablet TAKE ONE TABLET BY MOUTH IN THE MORNING (NEEDS APPOINTMENT BEFORE MORE REFILLS) 01/02/24   Crain, Whitney L, PA  lidocaine  (LIDODERM ) 5 % Place 3 patches onto the skin daily. Remove & Discard patch within 12 hours or as directed by MD 12/24/23   Lowella Folks L, PA  lisinopril  (ZESTRIL ) 10 MG tablet Take 1 tablet (10 mg total) by mouth  daily. 01/02/24   Crain, Whitney L, PA  lisinopril  (ZESTRIL ) 5 MG tablet Take 1 tablet (5 mg total) by mouth daily. 12/31/23   Crain, Whitney L, PA  meclizine  (ANTIVERT ) 25 MG tablet Take 1 tablet (25 mg total) by mouth 3 (three) times daily as needed for dizziness. 07/08/22   Sebastian Toribio GAILS, MD  NEXIUM  40 MG capsule Take 1 capsule (40 mg total) by mouth daily. 12/02/23   Crain, Whitney L, PA  NOVOLOG  FLEXPEN 100 UNIT/ML FlexPen Inject 20 units into the skin before breakfast, 10 units before lunch, and 20 units before dinner. (Total daily dose = 50 units.)    [provider]  nystatin  (MYCOSTATIN /NYSTOP ) powder Apply 1 Application topically 3 (three) times daily. 12/09/23   Crain, Whitney L, PA  Oxycodone  HCl 10 MG TABS Take 1 tablet (10 mg total) by mouth every 6 (six) hours as needed (for pain). 12/30/20   Regalado, Belkys A, MD  Prenatal Vit-Fe Fumarate-FA (WESTAB PLUS) 27-1 MG TABS Take 1 tablet by mouth daily. 05/18/22   [provider]  promethazine  (PHENERGAN ) 25 MG tablet Take 1 tablet (25 mg total) by mouth every 8 (eight) hours as needed for nausea or vomiting. 11/18/23   Crain, Whitney L, PA  promethazine -dextromethorphan (PROMETHAZINE -DM) 6.25-15 MG/5ML syrup Take 5 mLs by mouth 4 (four) times daily as needed for cough (Maximum dose: 30mL in 24 hours). 12/24/23   Alvia Bring, DO  RELPAX  40 MG tablet Take 1 tablet (40 mg total) by mouth as needed for migraine or headache. May repeat in 2 hours if headache persists or recurs. 12/09/23   Crain, Whitney L, PA  sertraline  (ZOLOFT ) 100 MG tablet Take 2 tablets (200 mg total) by mouth at bedtime. 12/31/23   Crain, Whitney L, PA  tirzepatide [facility]) 7.5 MG/0.5ML Pen Inject 7.5 mg into the skin once a week.    [provider]  triamcinolone  cream (KENALOG ) 0.1 % Apply 1 Application topically 2 (two) times daily as needed (skin rash). 11/18/23   Crain, Whitney L, PA  VASCEPA  1 g capsule Take 2 capsules (2 g total) by mouth 2  (two) times daily. 09/16/23   Alvan Dorothyann BIRCH, MD    Allergies: Penicillins, Clindamycin /lincomycin, Nsaids, Sulfa  antibiotics, Dulaglutide, No healthtouch food allergies, and Versed  [midazolam ]    Review of Systems  All other systems reviewed and are negative.   Updated Vital Signs BP 104/64   Pulse 77   Temp 97.6 F (36.4 C) (Oral)   Resp 14   SpO2 97%   Physical Exam Vitals and nursing note reviewed.  Constitutional:      Appearance: Normal appearance. She is obese.  HENT:     Head: Normocephalic and atraumatic.     Right Ear: External  ear normal.     Left Ear: External ear normal.     Nose: Nose normal.     Mouth/Throat:     Mouth: Mucous membranes are dry.  Eyes:     Extraocular Movements: Extraocular movements intact.     Conjunctiva/sclera: Conjunctivae normal.     Pupils: Pupils are equal, round, and reactive to light.  Cardiovascular:     Rate and Rhythm: Normal rate and regular rhythm.     Pulses: Normal pulses.     Heart sounds: Normal heart sounds.  Pulmonary:     Effort: Pulmonary effort is normal.     Breath sounds: Normal breath sounds.  Abdominal:     General: Abdomen is flat. Bowel sounds are normal.     Palpations: Abdomen is soft.  Musculoskeletal:        General: Normal range of motion.     Cervical back: Normal range of motion and neck supple.  Skin:    General: Skin is warm.     Capillary Refill: Capillary refill takes less than 2 seconds.  Neurological:     Mental Status: She is alert and oriented to person, place, and time.     Comments: Right foot drop (chronic)  Psychiatric:        Mood and Affect: Mood normal.        Behavior: Behavior normal.     (all labs ordered are listed, but only abnormal results are displayed) Labs Reviewed  SALICYLATE LEVEL - Abnormal; Notable for the following components:      Result Value   Salicylate Lvl <7.0 (*)    All other components within normal limits  ACETAMINOPHEN  LEVEL - Abnormal;  Notable for the following components:   Acetaminophen  (Tylenol ), Serum <10 (*)    All other components within normal limits  RAPID URINE DRUG SCREEN, HOSP PERFORMED - Abnormal; Notable for the following components:   Opiates POSITIVE (*)    All other components within normal limits  CBC WITH DIFFERENTIAL/PLATELET - Abnormal; Notable for the following components:   WBC 10.9 (*)    All other components within normal limits  COMPREHENSIVE METABOLIC PANEL WITH GFR - Abnormal; Notable for the following components:   Sodium 134 (*)    CO2 17 (*)    BUN 41 (*)    Calcium  8.4 (*)    Albumin 3.1 (*)    All other components within normal limits  CBG MONITORING, ED - Abnormal; Notable for the following components:   Glucose-Capillary 126 (*)    All other components within normal limits  ETHANOL    EKG: EKG Interpretation Date/Time:  Wednesday January 07 2024 10:28:13 EDT Ventricular Rate:  83 PR Interval:  177 QRS Duration:  99 QT Interval:  388 QTC Calculation: 456 R Axis:   14  Text Interpretation: Sinus rhythm No significant change since last tracing Confirmed by Dean Clarity [phone]) on 01/07/2024 10:33:50 AM  Radiology: No results found.   Procedures   Medications Ordered in the ED  sodium chloride  0.9 % bolus 1,000 mL (has no administration in time range)  acetaminophen  (TYLENOL ) tablet 1,000 mg (has no administration in time range)  sodium chloride  0.9 % bolus 500 mL (500 mLs Intravenous New Bag/Given 01/07/24 1036)                                    Medical Decision Making Amount and/or Complexity of Data Reviewed  Labs: ordered.  Risk OTC drugs.   This patient presents to the ED for concern of drug od, this involves an extensive number of treatment options, and is a complaint that carries with it a high risk of complications and morbidity.  The differential diagnosis includes respiratory suppression, si   Co morbidities that complicate the patient  evaluation  chronic pain (oxycodone  10s), obesity, dm, hypothyroidism, depression, fibromyalgia, right foot drop secondary to back injury, hld, and migraines   Additional history obtained:  Additional history obtained from epic chart review External records from outside source obtained and reviewed including EMS report, daughter   Lab Tests:  I Ordered, and personally interpreted labs.  The pertinent results include:  cbc nl, cmp with bun sl elevated at 41; etoh neg; acet neg; sal neg; uds + opiates   Cardiac Monitoring:  The patient was maintained on a cardiac monitor.  I personally viewed and interpreted the cardiac monitored which showed an underlying rhythm of: nsr   Medicines ordered and prescription drug management:  I ordered medication including ivfs  for sx  Reevaluation of the patient after these medicines showed that the patient improved I have reviewed the patients home medicines and have made adjustments as needed   Problem List / ED Course:  Unintentional opiate od:  pt has not required any narcan .  She is still somnolent, but will wake up to stimuli.  When she is more alert, she can go home.     Reevaluation:  After the interventions noted above, I reevaluated the patient and found that they have :improved   Social Determinants of Health:  Lives at home   Dispostion:  After consideration of the diagnostic results and the patients response to treatment, I feel that the patent would benefit from discharge with outpatient f/u.       Final diagnoses:  Opiate overdose, accidental or unintentional, initial encounter [facility])    ED Discharge Orders     None          Dean Clarity, MD 01/07/24 1625

## 2024-01-07 NOTE — ED Provider Notes (Addendum)
 Patient complaining of left knee pain from a fall previous.  Wants x-rays.  Will order.  Patient now fairly functional has been cleared by poison control for discharge home.   Veva Grimley, MD 01/07/24 1945  X-ray of the left knee severe osteoarthritis with small joint effusion but no fractures or dislocation.  Would recommend that you follow-up with your regular doctor.  And/or orthopedics.  Patient already followed by orthopedics they are planning knee replacement surgery.    Waverley Krempasky, MD 01/07/24 2209    Kyson Kupper, MD 01/07/24 2212

## 2024-01-07 NOTE — ED Notes (Signed)
 [name] with poison control recommendations: Narcan  for resp depression, EKG, cardiac monitoring, 4 hr post ingestion tylenol  level if its an intentional OD, 4-6 hr obs, 4 hrs post IV Narcan , 7 hr obs post Narcan  given in any other route.

## 2024-01-08 ENCOUNTER — Other Ambulatory Visit: Payer: Self-pay | Admitting: Urgent Care

## 2024-01-08 MED ORDER — BUPROPION HCL ER (XL) 150 MG PO TB24: 150.0000 mg | ORAL_TABLET | ORAL | 1 refills | Status: DC

## 2024-01-08 MED ORDER — BUPROPION HCL ER (XL) 300 MG PO TB24: 300.0000 mg | ORAL_TABLET | Freq: Every day | ORAL | 0 refills | Status: DC

## 2024-01-08 NOTE — Telephone Encounter (Signed)
 Copied from CRM [id number]. Topic: Clinical - Medication Refill >> Jan 08, 2024 12:32 PM Winona R wrote: Medication:  buPROPion  (WELLBUTRIN  XL) 300 MG 24 hr tablet   Has the patient contacted their pharmacy? Yes pharmacy calling on her behalf  (Agent: If no, request that the patient contact the pharmacy for the refill. If patient does not wish to contact the pharmacy document the reason why and proceed with request.) (Agent: If yes, when and what did the pharmacy advise?)  This is the patient's preferred pharmacy:  [facility] [place], [place] - [address] AT Portal to [facility] [facility] [address] [place] [address] Phone: [phone] Fax: [phone] Is this the correct pharmacy for this prescription? Yes If no, delete pharmacy and type the correct one.   Has the prescription been filled recently? Yes  Is the patient out of the medication? No  Has the patient been seen for an appointment in the last year OR does the patient have an upcoming appointment? Yes  Can we respond through MyChart? No- pt unavail to answer   Agent: Please be advised that Rx refills may take up to 3 business days. We ask that you follow-up with your pharmacy.

## 2024-01-08 NOTE — Telephone Encounter (Signed)
 Requesting rx rf fo buproprion 300mg   Last written 11/19/2023 Last  OV 11/18/2023 Upcoming appt 12/02/225

## 2024-01-14 ENCOUNTER — Ambulatory Visit: Payer: Self-pay

## 2024-01-14 ENCOUNTER — Other Ambulatory Visit: Payer: Self-pay | Admitting: Urgent Care

## 2024-01-14 DIAGNOSIS — R300 Dysuria: Secondary | ICD-10-CM

## 2024-01-14 MED ORDER — CIPROFLOXACIN HCL 500 MG PO TABS: 500.0000 mg | ORAL_TABLET | Freq: Two times a day (BID) | ORAL | 0 refills | Status: AC

## 2024-01-14 MED ORDER — CIPROFLOXACIN HCL 500 MG PO TABS: 500.0000 mg | ORAL_TABLET | Freq: Two times a day (BID) | ORAL | 0 refills | Status: DC

## 2024-01-14 NOTE — Telephone Encounter (Signed)
 [name] states she cannot come in for an appointment. She states she will need a virtual visit. She has had painful urination for a few days. She believes the catheter cause a UTI.   There are no appointments for today or tomorrow with any provider.

## 2024-01-14 NOTE — Progress Notes (Signed)
 Rx needed to be called in again - previous Rx was printed.

## 2024-01-14 NOTE — Telephone Encounter (Signed)
     FYI Only or Action Required?: Action required by provider: clinical question for provider.  Patient was last seen in primary care on 12/24/2023 by Alvia Bring, DO.  Called Nurse Triage reporting Knee Pain and urinary symptoms.  Symptoms began several days ago.  Interventions attempted: OTC medications: AZO.  Symptoms are: gradually improving.Urinary pain, burning, pelvic pain. Cannot come in for visit due to bilateral knee pain.No availability for virtual. Asking for assistance, please advise pt.  Triage Disposition: See Physician Within 24 Hours  Patient/caregiver understands and will follow disposition?: YesCopied from CRM #[id number]. Topic: Clinical - Red Word Triage >> Jan 14, 2024  1:58 PM Dedra B wrote: [facility] that prompted transfer to Nurse Triage: Pt having pain in L  leg and knee and can't put any weight it. She also feels like she's on fire when urinating. She thinks a catheter caused a uti. Warm transfer to NT. Reason for Disposition  Urinating more frequently than usual (i.e., frequency) OR new-onset of the feeling of an urgent need to urinate (i.e., urgency)  Answer Assessment - Initial Assessment Questions 1. SYMPTOM: What's the main symptom you're concerned about? (e.g., frequency, incontinence)     Pain, burning 2. ONSET: When did the    start?     A few days ago 3. PAIN: Is there any pain? If Yes, ask: How bad is it? (Scale: 1-10; mild, moderate, severe)     8 4. CAUSE: What do you think is causing the symptoms?     UTI 5. OTHER SYMPTOMS: Do you have any other symptoms? (e.g., blood in urine, fever, flank pain, pain with urination)     Pelvic pain 6. PREGNANCY: Is there any chance you are pregnant? When was your last menstrual period?     no  Protocols used: Urinary Symptoms-A-AH

## 2024-01-19 ENCOUNTER — Telehealth: Payer: Self-pay | Admitting: Orthopaedic Surgery

## 2024-01-19 NOTE — Telephone Encounter (Signed)
 Patient called. Would like to know how much weight she needed to lose before surgery? Her cb# [phone]

## 2024-01-26 ENCOUNTER — Encounter: Payer: Self-pay | Admitting: Radiology

## 2024-01-29 DIAGNOSIS — G43711 Chronic migraine without aura, intractable, with status migrainosus: Secondary | ICD-10-CM | POA: Diagnosis not present

## 2024-01-29 DIAGNOSIS — Z6841 Body Mass Index (BMI) 40.0 and over, adult: Secondary | ICD-10-CM | POA: Diagnosis not present

## 2024-01-29 DIAGNOSIS — G894 Chronic pain syndrome: Secondary | ICD-10-CM | POA: Diagnosis not present

## 2024-01-29 DIAGNOSIS — E1165 Type 2 diabetes mellitus with hyperglycemia: Secondary | ICD-10-CM | POA: Diagnosis not present

## 2024-01-29 DIAGNOSIS — G5603 Carpal tunnel syndrome, bilateral upper limbs: Secondary | ICD-10-CM | POA: Diagnosis not present

## 2024-01-29 DIAGNOSIS — M7912 Myalgia of auxiliary muscles, head and neck: Secondary | ICD-10-CM | POA: Diagnosis not present

## 2024-01-29 DIAGNOSIS — Z79891 Long term (current) use of opiate analgesic: Secondary | ICD-10-CM | POA: Diagnosis not present

## 2024-01-29 DIAGNOSIS — Z794 Long term (current) use of insulin: Secondary | ICD-10-CM | POA: Diagnosis not present

## 2024-01-29 DIAGNOSIS — G253 Myoclonus: Secondary | ICD-10-CM | POA: Diagnosis not present

## 2024-01-29 DIAGNOSIS — E1140 Type 2 diabetes mellitus with diabetic neuropathy, unspecified: Secondary | ICD-10-CM | POA: Diagnosis not present

## 2024-02-24 ENCOUNTER — Encounter: Admitting: Urgent Care

## 2024-02-26 ENCOUNTER — Other Ambulatory Visit (HOSPITAL_COMMUNITY): Payer: Self-pay

## 2024-03-04 DIAGNOSIS — Z79891 Long term (current) use of opiate analgesic: Secondary | ICD-10-CM | POA: Diagnosis not present

## 2024-03-04 DIAGNOSIS — G5603 Carpal tunnel syndrome, bilateral upper limbs: Secondary | ICD-10-CM | POA: Diagnosis not present

## 2024-03-04 DIAGNOSIS — G894 Chronic pain syndrome: Secondary | ICD-10-CM | POA: Diagnosis not present

## 2024-03-04 DIAGNOSIS — M7912 Myalgia of auxiliary muscles, head and neck: Secondary | ICD-10-CM | POA: Diagnosis not present

## 2024-03-04 DIAGNOSIS — G253 Myoclonus: Secondary | ICD-10-CM | POA: Diagnosis not present

## 2024-03-04 DIAGNOSIS — G43719 Chronic migraine without aura, intractable, without status migrainosus: Secondary | ICD-10-CM | POA: Diagnosis not present

## 2024-03-15 ENCOUNTER — Other Ambulatory Visit (INDEPENDENT_AMBULATORY_CARE_PROVIDER_SITE_OTHER): Payer: Self-pay

## 2024-03-15 ENCOUNTER — Encounter: Payer: Self-pay | Admitting: Urgent Care

## 2024-03-15 ENCOUNTER — Encounter: Payer: Self-pay | Admitting: Orthopaedic Surgery

## 2024-03-15 ENCOUNTER — Ambulatory Visit: Admitting: Orthopaedic Surgery

## 2024-03-15 VITALS — Ht 62.8 in | Wt 276.4 lb

## 2024-03-15 DIAGNOSIS — M25561 Pain in right knee: Secondary | ICD-10-CM

## 2024-03-15 DIAGNOSIS — M1711 Unilateral primary osteoarthritis, right knee: Secondary | ICD-10-CM | POA: Diagnosis not present

## 2024-03-15 DIAGNOSIS — M25562 Pain in left knee: Secondary | ICD-10-CM

## 2024-03-15 DIAGNOSIS — M1712 Unilateral primary osteoarthritis, left knee: Secondary | ICD-10-CM

## 2024-03-15 DIAGNOSIS — L304 Erythema intertrigo: Secondary | ICD-10-CM

## 2024-03-15 DIAGNOSIS — G8929 Other chronic pain: Secondary | ICD-10-CM

## 2024-03-15 DIAGNOSIS — I10 Essential (primary) hypertension: Secondary | ICD-10-CM

## 2024-03-15 DIAGNOSIS — G894 Chronic pain syndrome: Secondary | ICD-10-CM

## 2024-03-15 MED ORDER — LIDOCAINE 5 % EX PTCH: 3.0000 | MEDICATED_PATCH | CUTANEOUS | 1 refills | Status: AC

## 2024-03-15 MED ORDER — NYSTATIN 100000 UNIT/GM EX POWD: 1.0000 | Freq: Three times a day (TID) | CUTANEOUS | 1 refills | Status: AC

## 2024-03-15 MED ORDER — LISINOPRIL 10 MG PO TABS: 10.0000 mg | ORAL_TABLET | Freq: Every day | ORAL | 0 refills | Status: AC

## 2024-03-15 NOTE — Progress Notes (Signed)
 The patient is someone I have known for a very long period of time now.  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  It is gotten so severe that she can barely walk.  She does still have a high BMI of 49.28 but it is interesting the fact that she has lost about 3 inches in height since we have seen her over the years.  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  Her hemoglobin A1c is now in the low 7 range.  She is asking appropriate about aquatic therapy as well.  I did give her a prescription for aquatic therapy.  Both knees have significant disease with severe pain throughout the arc of motion of the knees and flexion contractures with crepitation involving all 3 compartments.  Again the x-rays also confirm severe end-stage arthritis with malalignment of both knees.  I gave her prescription for aquatic therapy and we will see her back in 6 weeks.  It is worth assessing her hide again for recalculating her BMI.  Her insurance changes also of the first of the year.  I believe we are getting to the point where it is medically necessary to proceed with a right knee replacement and I do not see an issue from a surgical standpoint in terms of the soft tissue around her knee other than the severe deformity knowing this will be a difficult case just from that but not from her soft tissue in her obesity.  Hopefully we can get an insurance company to agree that proceeding with surgery would help be in the patient's best interest.

## 2024-03-22 ENCOUNTER — Telehealth: Payer: Self-pay | Admitting: Radiology

## 2024-03-22 ENCOUNTER — Other Ambulatory Visit: Payer: Self-pay | Admitting: Family Medicine

## 2024-03-22 DIAGNOSIS — F331 Major depressive disorder, recurrent, moderate: Secondary | ICD-10-CM

## 2024-03-22 NOTE — Telephone Encounter (Signed)
 Patient called triage line stating her knee locked up yesterday and she is unable to get it to extend. She is on the fentanyl  patch and on oxycodone  and it is doing nothing for her excruciating pain. She states that she cannot get into her bathroom and has had to get a potty chair.  She would like Dr. Damian recommendation on what she should do as she just saw him and he is trying to get her approved for surgery.  She states if she came to the office or is sent to the hospital, she will have to go by EMS.  CB [phone]

## 2024-04-01 ENCOUNTER — Other Ambulatory Visit: Payer: Self-pay

## 2024-04-01 DIAGNOSIS — M1711 Unilateral primary osteoarthritis, right knee: Secondary | ICD-10-CM

## 2024-04-01 DIAGNOSIS — G8929 Other chronic pain: Secondary | ICD-10-CM

## 2024-04-01 DIAGNOSIS — M1712 Unilateral primary osteoarthritis, left knee: Secondary | ICD-10-CM

## 2024-04-05 ENCOUNTER — Telehealth: Payer: Self-pay | Admitting: Surgical

## 2024-04-05 ENCOUNTER — Ambulatory Visit: Admitting: Surgical

## 2024-04-05 NOTE — Telephone Encounter (Signed)
 Pt called stating she is unable to make 12:30 appt today due to no transportationl and need another appt. Please call pt about this matter at [phone].

## 2024-04-14 ENCOUNTER — Encounter: Payer: Self-pay | Admitting: Urgent Care

## 2024-04-14 DIAGNOSIS — L304 Erythema intertrigo: Secondary | ICD-10-CM

## 2024-04-14 MED ORDER — BUPROPION HCL ER (XL) 150 MG PO TB24: 150.0000 mg | ORAL_TABLET | ORAL | 0 refills | Status: AC

## 2024-04-14 MED ORDER — BUPROPION HCL ER (XL) 300 MG PO TB24: 300.0000 mg | ORAL_TABLET | Freq: Every day | ORAL | 0 refills | Status: AC

## 2024-04-14 MED ORDER — LEVOTHYROXINE SODIUM 150 MCG PO TABS: 150.0000 ug | ORAL_TABLET | Freq: Every day | ORAL | 0 refills | Status: AC

## 2024-04-14 NOTE — Telephone Encounter (Signed)
 FYI Patient requesting to move Both Bupropion  refills to local pharmacy - [facility] pharmacy And refill of Levothyroxine  until can be seen at upcoming appt on 05/14/2024  This has been  sent to pharmacy  as well as 30 days of levothyroxine  to last until patient can be seen at upcoming appt   Also need to call [facility] pharmacy regarding Nystatin  powder as patient only received a 60G bottle not the 270 G that was prescribed  Pharmacy not yet open this morning - will call at later time.   Patient scheduled for follow up visit on 05/14/2024

## 2024-04-14 NOTE — Telephone Encounter (Signed)
 Spoke with pharmacy - pharmacist states they did have the nystatin  prescription written as qty# 270G but was only filled for 60G  bottle- she will order this for the patient and fill for the 270G ( patient then have a remaining refill of 210G at the pharmacy)   Patient was informed of the above as well as that the buproprion 150 and 300  and levothyroxine  prescriptions had been sent to the pharmacy until she could be seen at upcoming appt on 02/20226

## 2024-04-14 NOTE — Telephone Encounter (Signed)
 Thank you :)

## 2024-04-19 ENCOUNTER — Encounter: Admitting: Physician Assistant

## 2024-04-23 ENCOUNTER — Encounter: Admitting: Urgent Care

## 2024-04-26 ENCOUNTER — Ambulatory Visit: Admitting: Orthopaedic Surgery

## 2024-05-14 ENCOUNTER — Ambulatory Visit: Admitting: Urgent Care

## 2024-05-27 ENCOUNTER — Ambulatory Visit: Admitting: Orthopaedic Surgery
